# Patient Record
Sex: Male | Born: 1955 | Race: Black or African American | Hispanic: No | Marital: Married | Smoking: Never smoker
Health system: Southern US, Community
[De-identification: ages and names within clinical notes are randomized; demographics above are authoritative.]

## PROBLEM LIST (undated history)

## (undated) DIAGNOSIS — R569 Unspecified convulsions: Secondary | ICD-10-CM

## (undated) DIAGNOSIS — G8190 Hemiplegia, unspecified affecting unspecified side: Secondary | ICD-10-CM

## (undated) DIAGNOSIS — F0390 Unspecified dementia without behavioral disturbance: Secondary | ICD-10-CM

## (undated) DIAGNOSIS — I639 Cerebral infarction, unspecified: Secondary | ICD-10-CM

## (undated) DIAGNOSIS — M1990 Unspecified osteoarthritis, unspecified site: Secondary | ICD-10-CM

## (undated) HISTORY — PX: APPENDECTOMY: SHX54

## (undated) HISTORY — DX: Unspecified dementia, unspecified severity, without behavioral disturbance, psychotic disturbance, mood disturbance, and anxiety: F03.90

## (undated) HISTORY — DX: Unspecified osteoarthritis, unspecified site: M19.90

## (undated) HISTORY — DX: Hemiplegia, unspecified affecting unspecified side: G81.90

## (undated) HISTORY — DX: Unspecified convulsions: R56.9

---

## 2008-11-14 ENCOUNTER — Emergency Department (HOSPITAL_COMMUNITY): Admission: EM | Admit: 2008-11-14 | Discharge: 2008-11-14 | Payer: Self-pay | Admitting: Emergency Medicine

## 2011-09-16 LAB — CBC
MCV: 91.3 fL (ref 78.0–100.0)
Platelets: 232 10*3/uL (ref 150–400)
WBC: 8.6 10*3/uL (ref 4.0–10.5)

## 2011-09-16 LAB — POCT CARDIAC MARKERS
CKMB, poc: 1.6 ng/mL (ref 1.0–8.0)
Myoglobin, poc: 105 ng/mL (ref 12–200)
Myoglobin, poc: 132 ng/mL (ref 12–200)

## 2011-09-16 LAB — DIFFERENTIAL
Eosinophils Absolute: 0 10*3/uL (ref 0.0–0.7)
Lymphs Abs: 1.7 10*3/uL (ref 0.7–4.0)
Neutro Abs: 6.3 10*3/uL (ref 1.7–7.7)
Neutrophils Relative %: 73 % (ref 43–77)

## 2011-09-16 LAB — POCT I-STAT, CHEM 8
Chloride: 106 mEq/L (ref 96–112)
Creatinine, Ser: 1.3 mg/dL (ref 0.4–1.5)
HCT: 51 % (ref 39.0–52.0)
Hemoglobin: 17.3 g/dL — ABNORMAL HIGH (ref 13.0–17.0)
Potassium: 3.3 mEq/L — ABNORMAL LOW (ref 3.5–5.1)
Sodium: 143 mEq/L (ref 135–145)

## 2012-04-03 ENCOUNTER — Ambulatory Visit (INDEPENDENT_AMBULATORY_CARE_PROVIDER_SITE_OTHER): Payer: No Typology Code available for payment source

## 2012-04-04 ENCOUNTER — Ambulatory Visit (INDEPENDENT_AMBULATORY_CARE_PROVIDER_SITE_OTHER): Payer: No Typology Code available for payment source | Admitting: Family Medicine

## 2012-04-04 VITALS — BP 103/68 | HR 66 | Temp 97.7°F | Resp 16 | Ht 67.5 in | Wt 168.0 lb

## 2012-04-04 DIAGNOSIS — Z789 Other specified health status: Secondary | ICD-10-CM

## 2012-04-04 MED ORDER — MEFLOQUINE HCL 250 MG PO TABS: 250.0000 mg | ORAL_TABLET | ORAL | Status: DC

## 2012-04-04 NOTE — Progress Notes (Signed)
  Patient Name: Donald [name] Date of Birth: 12-29-1955 Medical Record Number: [id number] Gender: male Date of Encounter: 04/04/2012  History of Present Illness:  Donald [name] is a 56 y.o. very pleasant male patient who presents with the following:  Will travel to [place] this week- visiting [place] only.  He will be threre for 4 weeks  There is no problem list on file for this patient.  No past medical history on file. No past surgical history on file. History  Substance Use Topics  . Smoking status: Never Smoker   . Smokeless tobacco: Not on file  . Alcohol Use: Not on file   No family history on file. Allergies  Allergen Reactions  . Sulfa Antibiotics Other (See Comments)    Nausea and eye swelling     Medication list has been reviewed and updated.  Review of Systems: As per HPI- otherwise negative.   Physical Examination: Filed Vitals:   04/04/12 1101  BP: 103/68  Pulse: 66  Temp: 97.7 F (36.5 C)  TempSrc: Oral  Resp: 16  Height: 5' 7.5" (1.715 m)  Weight: 168 lb (76.204 kg)    Body mass index is 25.92 kg/(m^2).  GEN: WDWN, NAD, Non-toxic, A & O x 3 HEENT: Atraumatic, Normocephalic. Neck supple. No masses, No LAD. Ears and Nose: No external deformity. CV: RRR, No M/G/R. No JVD. No thrill. No extra heart sounds. PULM: CTA B, no wheezes, crackles, rhonchi. No retractions. No resp. distress. No accessory muscle use EXTR: No c/c/e NEURO Normal gait.  PSYCH: Normally interactive. Conversant. Not depressed or anxious appearing.  Calm demeanor.    Assessment and Plan: 1. Foreign travel    Refilled mefloquine, one tablet weekly starting now and continuing for 4 weeks upon returning.  His tetanus is UTD.  We wish him a safe trip!

## 2012-05-09 ENCOUNTER — Ambulatory Visit: Payer: No Typology Code available for payment source

## 2012-05-09 ENCOUNTER — Ambulatory Visit (INDEPENDENT_AMBULATORY_CARE_PROVIDER_SITE_OTHER): Payer: No Typology Code available for payment source | Admitting: Family Medicine

## 2012-05-09 VITALS — BP 127/74 | HR 53 | Temp 98.6°F | Resp 18 | Ht 67.5 in | Wt 167.4 lb

## 2012-05-09 DIAGNOSIS — M549 Dorsalgia, unspecified: Secondary | ICD-10-CM

## 2012-05-09 DIAGNOSIS — M5430 Sciatica, unspecified side: Secondary | ICD-10-CM

## 2012-05-09 MED ORDER — CYCLOBENZAPRINE HCL 10 MG PO TABS: 10.0000 mg | ORAL_TABLET | Freq: Three times a day (TID) | ORAL | Status: AC | PRN

## 2012-05-09 MED ORDER — PREDNISONE 20 MG PO TABS: ORAL_TABLET | ORAL | Status: AC

## 2012-05-09 NOTE — Progress Notes (Signed)
  Subjective:    Patient ID: Donald [name], male    DOB: July 05, 1956, 56 y.o.   MRN: [id number]  HPI Patient presents with 3 day history of worsening right buttock/hip pain. States it started while he was abroad in [place]. He returned from his trip yesterday but states his symptoms started [place]. He says the pain radiates down the lateral part of his right leg all the way to his foot. Denies weakness, paresthesias, bowel/bladder incontinence or saddle anesthesia. He has good range of motion and says that being up and moving helps his pain. It is worse when trying to stand after being seated for long periods. Denies SOB, chest pain, or difficulty breathing. He has not tried any medications, ice, heat, or stretching.     Review of Systems  All other systems reviewed and are negative.       Objective:   Physical Exam  Constitutional: He is oriented to person, place, and time. He appears well-developed and well-nourished.  HENT:  Head: Normocephalic and atraumatic.  Right Ear: External ear normal.  Left Ear: External ear normal.  Eyes: Conjunctivae are normal.  Cardiovascular: Normal rate, regular rhythm, normal heart sounds and intact distal pulses.   Pulmonary/Chest: Effort normal and breath sounds normal.  Musculoskeletal:       Lumbar back: He exhibits normal range of motion, no tenderness and no spasm. Pain: patient has pain with lumbar flexion and right lateral rotation.  Neurological: He is alert and oriented to person, place, and time. He has normal strength and normal reflexes. Gait normal.  Skin: Skin is warm and dry.  Psychiatric: He has a normal mood and affect. His behavior is normal. Judgment and thought content normal.     [facility] reading (PRIMARY) by  Dr. Neva Seat. No acute bony abnormalities. 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        Assessment & Plan:   1. Sciatica  Take prednisone as directed and flexeril as needed. If no  improvement in symptoms, or any worsening, return for further evaluation.    2. Backache  DG Lumbar Spine Complete, predniSONE (DELTASONE) 20 MG tablet, cyclobenzaprine (FLEXERIL) 10 MG tablet

## 2012-05-09 NOTE — Patient Instructions (Signed)

## 2012-05-09 NOTE — Progress Notes (Signed)
  Subjective:    Patient ID: Donald [name], male    DOB: 03-26-1956, 56 y.o.   MRN: [id number]  HPI    Review of Systems     Objective:   Physical Exam        Assessment & Plan:  Xray read and patient discussed with Rhoderick Moody, PAC. Suspect degenerative l1 on 2 retrolisthesis without apparent pars defects.   Agree with plan.

## 2012-06-09 ENCOUNTER — Other Ambulatory Visit: Payer: Self-pay | Admitting: Physician Assistant

## 2013-01-12 ENCOUNTER — Ambulatory Visit (INDEPENDENT_AMBULATORY_CARE_PROVIDER_SITE_OTHER): Payer: No Typology Code available for payment source | Admitting: Emergency Medicine

## 2013-01-12 VITALS — BP 110/72 | HR 65 | Temp 99.0°F | Resp 17 | Ht 67.5 in | Wt 172.0 lb

## 2013-01-12 DIAGNOSIS — Z7189 Other specified counseling: Secondary | ICD-10-CM

## 2013-01-12 DIAGNOSIS — B54 Unspecified malaria: Secondary | ICD-10-CM

## 2013-01-12 DIAGNOSIS — Z7184 Encounter for health counseling related to travel: Secondary | ICD-10-CM

## 2013-01-12 MED ORDER — DOXYCYCLINE HYCLATE 100 MG PO TABS: 100.0000 mg | ORAL_TABLET | Freq: Every day | ORAL | Status: DC

## 2013-01-12 NOTE — Progress Notes (Signed)
  Subjective:    Patient ID: Donald [name], male    DOB: 05/23/1956, 57 y.o.   MRN: [id number]  HPI patient here requesting medications for malaria prophylaxis. 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 He's taken Lariam in the past    Review of Systems     Objective:   Physical Exam  Patient is alert cooperative in no distress the      Assessment & Plan:  Plan on doxycycline to start 2 days before he leaves during his trip and for 4 weeks after as malaria prophylaxis .

## 2014-01-13 ENCOUNTER — Encounter: Payer: Self-pay | Admitting: Family Medicine

## 2014-01-13 ENCOUNTER — Ambulatory Visit (INDEPENDENT_AMBULATORY_CARE_PROVIDER_SITE_OTHER): Payer: No Typology Code available for payment source | Admitting: Family Medicine

## 2014-01-13 ENCOUNTER — Ambulatory Visit: Payer: No Typology Code available for payment source

## 2014-01-13 VITALS — BP 122/84 | HR 68 | Temp 98.0°F | Resp 16 | Ht 67.5 in | Wt 173.9 lb

## 2014-01-13 DIAGNOSIS — M25519 Pain in unspecified shoulder: Secondary | ICD-10-CM

## 2014-01-13 DIAGNOSIS — M7550 Bursitis of unspecified shoulder: Secondary | ICD-10-CM

## 2014-01-13 MED ORDER — PREDNISONE 20 MG PO TABS: ORAL_TABLET | ORAL | Status: DC

## 2014-01-13 NOTE — Patient Instructions (Signed)
Return Thursday if the medicine is not effective.

## 2014-01-13 NOTE — Progress Notes (Signed)
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 Several weeks of increasing right shoulder pain without history of trauma. He tried doing some exercise which seemed to make it worse. He's also tried taking fish oil thinking that that would help the situation but that his not been the case.  Patient notes the pain is worse when he abducts the arm or reaches behind his back. He's not had this problem before.  Objective: Healthy appearing gentleman in no acute distress Palpation around the chest reveals mild tenderness just below the acromion on the right. He does have pain with abduction or extreme internal rotation.  There is no bony abnormality on inspection and there is no swelling of the soft tissues around the shoulder  [facility] reading (PRIMARY) by  Dr. Milus GlazierLauenstein:  Right shoulder shows no bony abnormality  Assessment: right shoulder bursitis  Plan:  . Pain in joint, shoulder region - Plan: DG Shoulder Right, predniSONE (DELTASONE) 20 MG tablet  Shoulder bursitis - Plan: predniSONE (DELTASONE) 20 MG tablet If not improving, recheck this coming Thursday for injection.   Signed, Elvina SidleKurt Sanaa Zilberman, MD   Signed, Elvina SidleKurt Aristeo Hankerson, MD

## 2014-02-20 ENCOUNTER — Encounter: Payer: Self-pay | Admitting: Physician Assistant

## 2014-02-20 ENCOUNTER — Ambulatory Visit (INDEPENDENT_AMBULATORY_CARE_PROVIDER_SITE_OTHER): Payer: No Typology Code available for payment source | Admitting: Physician Assistant

## 2014-02-20 VITALS — BP 131/85 | HR 62 | Temp 98.2°F | Resp 16 | Ht 67.5 in | Wt 174.0 lb

## 2014-02-20 DIAGNOSIS — Z7189 Other specified counseling: Secondary | ICD-10-CM

## 2014-02-20 DIAGNOSIS — IMO0002 Reserved for concepts with insufficient information to code with codable children: Secondary | ICD-10-CM

## 2014-02-20 MED ORDER — ATOVAQUONE-PROGUANIL HCL 250-100 MG PO TABS: 1.0000 | ORAL_TABLET | Freq: Every day | ORAL | Status: DC

## 2014-02-20 NOTE — Progress Notes (Signed)
   Subjective:    Patient ID: Donald [name], male    DOB: 07/31/1956, 58 y.o.   MRN: [id number]  HPI 58 year old male presents for malaria prophylaxis. He has taken mefloquine and doxycycline in the past. States he did not tolerate doxycyline well and would prefer mefloquine if possible.  However, his flight leaves today at 2:00 p.m and mefloquine is recommended to start 1-2 weeks prior to travel.  He is concerned about side affects of other medications. Has never taken malarone before.  Tetanus and all other immunizations UTD.  He will be traveling to [place] for 2 weeks.  Patient is otherwise doing well with no other concerns today.     Review of Systems  Constitutional: Negative for fever and chills.  Gastrointestinal: Negative for nausea and vomiting.       Objective:   Physical Exam  Constitutional: He is oriented to person, place, and time. He appears well-developed and well-nourished.  HENT:  Head: Normocephalic and atraumatic.  Right Ear: External ear normal.  Left Ear: External ear normal.  Eyes: Conjunctivae are normal.  Neck: Normal range of motion.  Cardiovascular: Normal rate.   Pulmonary/Chest: Effort normal.  Neurological: He is alert and oriented to person, place, and time.  Psychiatric: He has a normal mood and affect. His behavior is normal. Judgment and thought content normal.          Assessment & Plan:  Advice or immunization for travel - Plan: atovaquone-proguanil (MALARONE) 250-100 MG TABS  Will treat with Malarone. Start today, take daily while abroad then for 7 days upon return Follow up here as needed. Safe travels!

## 2014-05-07 ENCOUNTER — Ambulatory Visit (INDEPENDENT_AMBULATORY_CARE_PROVIDER_SITE_OTHER): Payer: No Typology Code available for payment source | Admitting: Family Medicine

## 2014-05-07 VITALS — BP 118/74 | HR 62 | Temp 97.9°F | Resp 16 | Ht 67.0 in | Wt 171.2 lb

## 2014-05-07 DIAGNOSIS — H00019 Hordeolum externum unspecified eye, unspecified eyelid: Secondary | ICD-10-CM

## 2014-05-07 DIAGNOSIS — H44009 Unspecified purulent endophthalmitis, unspecified eye: Secondary | ICD-10-CM

## 2014-05-07 MED ORDER — OFLOXACIN 0.3 % OP SOLN: 2.0000 [drp] | Freq: Four times a day (QID) | OPHTHALMIC | Status: DC

## 2014-05-07 NOTE — Patient Instructions (Signed)
Sty A sty (hordeolum) is an infection of a gland in the eyelid located at the base of the eyelash. A sty may develop a white or yellow head of pus. It can be puffy (swollen). Usually, the sty will burst and pus will come out on its own. They do not leave lumps in the eyelid once they drain. A sty is often confused with another form of cyst of the eyelid called a chalazion. Chalazions occur within the eyelid and not on the edge where the bases of the eyelashes are. They often are red, sore and then form firm lumps in the eyelid. CAUSES   Germs (bacteria).  Lasting (chronic) eyelid inflammation. SYMPTOMS   Tenderness, redness and swelling along the edge of the eyelid at the base of the eyelashes.  Sometimes, there is a white or yellow head of pus. It may or may not drain. DIAGNOSIS  An ophthalmologist will be able to distinguish between a sty and a chalazion and treat the condition appropriately.  TREATMENT   Styes are typically treated with warm packs (compresses) until drainage occurs.  In rare cases, medicines that kill germs (antibiotics) may be prescribed. These antibiotics may be in the form of drops, cream or pills.  If a hard lump has formed, it is generally necessary to do a small incision and remove the hardened contents of the cyst in a minor surgical procedure done in the office.  In suspicious cases, your caregiver may send the contents of the cyst to the lab to be certain that it is not a rare, but dangerous form of cancer of the glands of the eyelid. HOME CARE INSTRUCTIONS   Wash your hands often and dry them with a clean towel. Avoid touching your eyelid. This may spread the infection to other parts of the eye.  Apply heat to your eyelid for 10 to 20 minutes, several times a day, to ease pain and help to heal it faster.  Do not squeeze the sty. Allow it to drain on its own. Wash your eyelid carefully 3 to 4 times per day to remove any pus. SEEK IMMEDIATE MEDICAL CARE IF:    Your eye becomes painful or puffy (swollen).  Your vision changes.  Your sty does not drain by itself within 3 days.  Your sty comes back within a short period of time, even with treatment.  You have redness (inflammation) around the eye.  You have a fever. Document Released: 09/07/2005 Document Revised: 02/20/2012 Document Reviewed: 05/12/2009 Exit[employer] Patient Information 2014 [employer].  

## 2014-05-07 NOTE — Progress Notes (Signed)
Chief Complaint:  Chief Complaint  Patient presents with  . Eye Problem    rt eye pain x 3 days    HPI: Donald [name] is a 58 y.o. male who is here for  Right eye pain x 3 days.  No vision changes, he has had some clear draiange and a bump on the bottom of his right eyelid. No URI sxs  No prior h.o styes. Has tried warm compresses and salt water. NKI  No past medical history on file. Past Surgical History  Procedure Laterality Date  . Appendectomy     History   Social History  . Marital Status: Married    Spouse Name: N/A    Number of Children: N/A  . Years of Education: N/A   Social History Main Topics  . Smoking status: Never Smoker   . Smokeless tobacco: None  . Alcohol Use: No  . Drug Use: No  . Sexual Activity: Yes    Birth Control/ Protection: None   Other Topics Concern  . None   Social History Narrative  . None   Family History  Problem Relation Age of Onset  . Hypertension Mother    Allergies  Allergen Reactions  . Sulfa Antibiotics Other (See Comments)    Nausea and eye swelling    Prior to Admission medications   Medication Sig Start Date End Date Taking? Authorizing Provider  Fish Oil OIL by Does not apply route.    Historical Provider, MD  Multiple Vitamins-Minerals (MENS 50+ MULTI VITAMIN/MIN PO) Take by mouth.    Historical Provider, MD     ROS: The patient denies fevers, chills, night sweats, unintentional weight loss, chest pain, palpitations, wheezing, dyspnea on exertion, nausea, vomiting, abdominal pain, dysuria, hematuria, melena, numbness, weakness, or tingling.   All other systems have been reviewed and were otherwise negative with the exception of those mentioned in the HPI and as above.    PHYSICAL EXAM: Filed Vitals:   05/07/14 0911  BP: 118/74  Pulse: 62  Temp: 97.9 F (36.6 C)  Resp: 16   Filed Vitals:   05/07/14 0911  Height: 5\' 7"  (1.702 m)  Weight: 171 lb 3.2 oz (77.656 kg)   Body mass index is 26.81  kg/(m^2).  General: Alert, no acute distress HEENT:  Normocephalic, atraumatic, oropharynx patent. EOMI, PERRLA, fundo exam normal, + stye lower eye lid with erythema and minimal pustular  Clear dc.  Cardiovascular:  Regular rate and rhythm, no rubs murmurs or gallops.  No Carotid bruits, radial pulse intact. No pedal edema.  Respiratory: Clear to auscultation bilaterally.  No wheezes, rales, or rhonchi.  No cyanosis, no use of accessory musculature GI: No organomegaly, abdomen is soft and non-tender, positive bowel sounds.  No masses. Skin: No rashes. Neurologic: Facial musculature symmetric. Psychiatric: Patient is appropriate throughout our interaction. Lymphatic: No cervical lymphadenopathy Musculoskeletal: Gait intact.   LABS: Results for orders placed during the hospital encounter of 11/14/08  CBC      Result Value Ref Range   WBC 8.6  4.0 - 10.5 K/uL   RBC 5.30  4.22 - 5.81 MIL/uL   Hemoglobin 15.9  13.0 - 17.0 g/dL   HCT 16.148.4  09.639.0 - 04.552.0 %   MCV 91.3  78.0 - 100.0 fL   MCHC 32.9  30.0 - 36.0 g/dL   RDW 40.912.9  81.111.5 - 91.415.5 %   Platelets 232  150 - 400 K/uL  DIFFERENTIAL      Result  Value Ref Range   Neutrophils Relative % 73  43 - 77 %   Neutro Abs 6.3  1.7 - 7.7 K/uL   Lymphocytes Relative 20  12 - 46 %   Lymphs Abs 1.7  0.7 - 4.0 K/uL   Monocytes Relative 6  3 - 12 %   Monocytes Absolute 0.5  0.1 - 1.0 K/uL   Eosinophils Relative 1  0 - 5 %   Eosinophils Absolute 0.0  0.0 - 0.7 K/uL   Basophils Relative 1  0 - 1 %   Basophils Absolute 0.1  0.0 - 0.1 K/uL  POCT I-STAT, CHEM 8      Result Value Ref Range   Sodium 143  135 - 145 mEq/L   Potassium 3.3 (*) 3.5 - 5.1 mEq/L   Chloride 106  96 - 112 mEq/L   BUN 11  6 - 23 mg/dL   Creatinine, Ser 1.3  0.4 - 1.5 mg/dL   Glucose, Bld 277 (*) 70 - 99 mg/dL   Calcium, Ion 8.24  2.35 - 1.32 mmol/L   TCO2 27  0 - 100 mmol/L   Hemoglobin 17.3 (*) 13.0 - 17.0 g/dL   HCT 36.1  44.3 - 15.4 %  POCT CARDIAC MARKERS      Result  Value Ref Range   Myoglobin, poc 105  12 - 200 ng/mL   CKMB, poc 1.6  1.0 - 8.0 ng/mL   Troponin i, poc <0.05  0.00 - 0.09 ng/mL   Comment       Value:            TROPONIN VALUES IN THE RANGE     OF 0.00-0.09 ng/mL SHOW     NO INDICATION OF     MYOCARDIAL INJURY.  POCT CARDIAC MARKERS      Result Value Ref Range   Myoglobin, poc 132  12 - 200 ng/mL   CKMB, poc 1.4  1.0 - 8.0 ng/mL   Troponin i, poc <0.05  0.00 - 0.09 ng/mL   Comment       Value:            TROPONIN VALUES IN THE RANGE     OF 0.00-0.09 ng/mL SHOW     NO INDICATION OF     MYOCARDIAL INJURY.     EKG/XRAY:   Primary read interpreted by Dr. Conley Rolls at [facility].   ASSESSMENT/PLAN: Encounter Diagnoses  Name Primary?  . Stye Yes  . Eye infection    Warm compresses Rx ocuflox  F/u prn  Gross sideeffects, risk and benefits, and alternatives of medications d/w patient. Patient is aware that all medications have potential sideeffects and we are unable to predict every sideeffect or drug-drug interaction that may occur.  Thao P Le, DO 05/07/2014 10:08 AM

## 2015-04-28 ENCOUNTER — Ambulatory Visit (INDEPENDENT_AMBULATORY_CARE_PROVIDER_SITE_OTHER): Payer: No Typology Code available for payment source

## 2015-04-28 ENCOUNTER — Ambulatory Visit (INDEPENDENT_AMBULATORY_CARE_PROVIDER_SITE_OTHER): Payer: No Typology Code available for payment source | Admitting: Internal Medicine

## 2015-04-28 VITALS — BP 128/80 | HR 71 | Temp 98.4°F | Resp 16 | Ht 68.0 in | Wt 172.2 lb

## 2015-04-28 DIAGNOSIS — Z7189 Other specified counseling: Secondary | ICD-10-CM | POA: Diagnosis not present

## 2015-04-28 DIAGNOSIS — L299 Pruritus, unspecified: Secondary | ICD-10-CM

## 2015-04-28 DIAGNOSIS — G933 Postviral fatigue syndrome: Secondary | ICD-10-CM

## 2015-04-28 DIAGNOSIS — Z23 Encounter for immunization: Secondary | ICD-10-CM

## 2015-04-28 DIAGNOSIS — Z0000 Encounter for general adult medical examination without abnormal findings: Secondary | ICD-10-CM

## 2015-04-28 DIAGNOSIS — Z719 Counseling, unspecified: Secondary | ICD-10-CM

## 2015-04-28 DIAGNOSIS — G9331 Postviral fatigue syndrome: Secondary | ICD-10-CM

## 2015-04-28 LAB — POCT CBC
Granulocyte percent: 50.6 %G (ref 37–80)
HEMATOCRIT: 46.1 % (ref 43.5–53.7)
HEMOGLOBIN: 14.1 g/dL (ref 14.1–18.1)
LYMPH, POC: 2 (ref 0.6–3.4)
MCH, POC: 27.8 pg (ref 27–31.2)
MCHC: 30.6 g/dL — AB (ref 31.8–35.4)
MCV: 90.8 fL (ref 80–97)
MID (CBC): 0.3 (ref 0–0.9)
MPV: 9.3 fL (ref 0–99.8)
POC GRANULOCYTE: 2.4 (ref 2–6.9)
POC LYMPH %: 42.4 % (ref 10–50)
POC MID %: 7 % (ref 0–12)
Platelet Count, POC: 226 10*3/uL (ref 142–424)
RBC: 5.07 M/uL (ref 4.69–6.13)
RDW, POC: 16 %
WBC: 4.7 10*3/uL (ref 4.6–10.2)

## 2015-04-28 LAB — POCT URINALYSIS DIPSTICK
BILIRUBIN UA: NEGATIVE
Blood, UA: NEGATIVE
GLUCOSE UA: NEGATIVE
Ketones, UA: NEGATIVE
Leukocytes, UA: NEGATIVE
Nitrite, UA: NEGATIVE
Protein, UA: NEGATIVE
Spec Grav, UA: 1.015
Urobilinogen, UA: 0.2
pH, UA: 6

## 2015-04-28 LAB — POCT UA - MICROSCOPIC ONLY
Bacteria, U Microscopic: NEGATIVE
CRYSTALS, UR, HPF, POC: NEGATIVE
Casts, Ur, LPF, POC: NEGATIVE
EPITHELIAL CELLS, URINE PER MICROSCOPY: NEGATIVE
Mucus, UA: NEGATIVE
RBC, urine, microscopic: NEGATIVE
WBC, UR, HPF, POC: NEGATIVE
YEAST UA: NEGATIVE

## 2015-04-28 LAB — GLUCOSE, POCT (MANUAL RESULT ENTRY): POC GLUCOSE: 89 mg/dL (ref 70–99)

## 2015-04-28 MED ORDER — NYSTATIN 100000 UNIT/GM EX CREA: 1.0000 "application " | TOPICAL_CREAM | Freq: Two times a day (BID) | CUTANEOUS | Status: DC

## 2015-04-28 NOTE — Patient Instructions (Addendum)
Hepatitis A Vaccine, Inactivated suspension for injection What is this medicine? HEPATITIS A VACCINE (hep uh TAHY tis A VAK seen) is a vaccine to protect from an infection with the hepatitis A virus. This vaccine does not contain the live virus. It will not cause a hepatitis infection. This vaccine is also used with immunoglobulin to prevent infection in people who have been exposed to hepatitis A. This medicine may be used for other purposes; ask your health care provider or pharmacist if you have questions. COMMON BRAND NAME(S): Havrix, Vaqta What should I tell my health care provider before I take this medicine? They need to know if you have any of these conditions: -bleeding disorder -fever or infection -heart disease -immune system problems -an unusual or allergic reaction to hepatitis A vaccine, latex, neomycin, other medicines, foods, dyes, or preservatives -pregnant or trying to get pregnant -breast-feeding How should I use this medicine? This vaccine is for injection into a muscle. It is given by a health care professional. A copy of Vaccine Information Statements will be given before each vaccination. Read this sheet carefully each time. The sheet may change frequently. Talk to your pediatrician regarding the use of this medicine in children. While this drug may be prescribed for children as young as 52 months of age for selected conditions, precautions do apply. Overdosage: If you think you have taken too much of this medicine contact a poison control center or emergency room at once. NOTE: This medicine is only for you. Do not share this medicine with others. What if I miss a dose? This does not apply. What may interact with this medicine? -medicines to treat cancer -medicines that suppress your immune function like adalimumab, anakinra, etanercept, infliximab -steroid medicines like prednisone or cortisone This list may not describe all possible interactions. Give your health  care provider a list of all the medicines, herbs, non-prescription drugs, or dietary supplements you use. Also tell them if you smoke, drink alcohol, or use illegal drugs. Some items may interact with your medicine. What should I watch for while using this medicine? See your health care provider for a booster shot of this vaccine as directed. Tell your doctor right away if you have any serious or unusual side effects after getting this vaccine. You will not have protection from the hepatitis A virus for at least 8 to 10 days after your first injection. The length of time you will have protection from hepatitis A virus infection is not known. Check with your doctor if you have questions about your immunity. See your doctor before you travel out of the country. What side effects may I notice from receiving this medicine? Side effects that you should report to your doctor or health care professional as soon as possible: -allergic reactions like skin rash, itching or hives, swelling of the face, lips, or tongue -breathing problems -seizures -yellowing of the eyes or skin Side effects that usually do not require medical attention (report to your doctor or health care professional if they continue or are bothersome): -diarrhea -fever -loss of appetite -muscle pain -nausea -pain, redness, swelling or irritation at site where injected -tiredness This list may not describe all possible side effects. Call your doctor for medical advice about side effects. You may report side effects to FDA at 1-800-FDA-1088. Where should I keep my medicine? This drug is given in a hospital or clinic and will not be stored at home. NOTE: This sheet is a summary. It may not cover all possible  information. If you have questions about this medicine, talk to your doctor, pharmacist, or health care provider.  2015, Elsevier/Gold Standard. (2014-03-31 13:19:40) Jock Itch Jock itch is a fungal infection of the skin in the  groin area. It is sometimes called "ringworm" even though it is not caused by a worm. A fungus is a type of germ that thrives in dark, damp places.  CAUSES  This infection may spread from:  A fungus infection elsewhere on the body (such as athlete's foot).  Sharing towels or clothing. This infection is more common in:  Hot, humid climates.  People who wear tight-fitting clothing or wet bathing suits for long periods of time.  Athletes.  Overweight people.  People with diabetes. SYMPTOMS  Jock itch causes the following symptoms:  Red, pink or brown rash in the groin. Rash may spread to the thighs, anus, and buttocks.  Itching. DIAGNOSIS  Your caregiver may make the diagnosis by looking at the rash. Sometimes a skin scraping will be sent to test for fungus. Testing can be done either by looking under the microscope or by doing a culture (test to try to grow the fungus). A culture can take up to 2 weeks to come back. TREATMENT  Jock itch may be treated with:  Skin cream or ointment to kill fungus.  Medicine by mouth to kill fungus.  Skin cream or ointment to calm the itching.  Compresses or medicated powders to dry the infected skin. HOME CARE INSTRUCTIONS   Be sure to treat the rash completely. Follow your caregiver's instructions. It can take a couple of weeks to treat. If you do not treat the infection long enough, the rash can come back.  Wear loose-fitting clothing.  Men should wear cotton boxer shorts.  Women should wear cotton underwear.  Avoid hot baths.  Dry the groin area well after bathing. SEEK MEDICAL CARE IF:   Your rash is worse.  Your rash is spreading.  Your rash returns after treatment is finished.  Your rash is not gone in 4 weeks. Fungal infections are slow to respond to treatment. Some redness may remain for several weeks after the fungus is gone. SEEK IMMEDIATE MEDICAL CARE IF:  The area becomes red, warm, tender, and swollen.  You have  a fever. Document Released: 11/18/2002 Document Revised: 02/20/2012 Document Reviewed: 10/17/2008 [facility] Patient Information 2015 [employer] [place]. This information is not intended to replace advice given to you by your health care provider. Make sure you discuss any questions you have with your health care provider.

## 2015-04-28 NOTE — Progress Notes (Signed)
Subjective:    Patient ID: Donald [name], male    DOB: 10/12/1956, 59 y.o.   MRN: [id number]  HPI Here for CPE. Had an itching episode last night on genitals only. No sxs today and feels good. Had colonoscopy 8 yrs ago [place]. No fhx of cancer.   Review of Systems  Constitutional: Negative.   HENT: Negative.   Eyes: Positive for pain and visual disturbance.  Respiratory: Negative.   Cardiovascular: Negative.   Gastrointestinal: Negative.   Endocrine: Positive for polyuria.  Genitourinary: Positive for frequency.  Musculoskeletal: Negative.   Allergic/Immunologic: Negative.   Neurological: Negative.   Hematological: Negative.   Psychiatric/Behavioral: Negative.        Objective:   Physical Exam  Constitutional: He is oriented to person, place, and time. He appears well-developed and well-nourished. No distress.  HENT:  Head: Normocephalic.  Right Ear: External ear normal.  Left Ear: External ear normal.  Nose: Nose normal.  Mouth/Throat: Oropharynx is clear and moist.  Eyes: Conjunctivae and EOM are normal. Pupils are equal, round, and reactive to light. No scleral icterus.  Neck: Normal range of motion. Neck supple. No tracheal deviation present. No thyromegaly present.  Cardiovascular: Normal rate, regular rhythm, S1 normal, S2 normal, normal heart sounds, intact distal pulses and normal pulses.  Exam reveals no gallop.   No murmur heard. Pulmonary/Chest: Effort normal and breath sounds normal. No respiratory distress. He has no wheezes. He has no rales. He exhibits no tenderness.  Abdominal: Soft. Bowel sounds are normal. There is no tenderness. There is no rebound and no guarding. Hernia confirmed negative in the right inguinal area and confirmed negative in the left inguinal area.  Genitourinary: Rectum normal, testes normal and penis normal. Rectal exam shows no external hemorrhoid, no internal hemorrhoid, no fissure, no mass, no tenderness and anal tone normal. Prostate  is tender. Prostate is not enlarged. Circumcised. No penile erythema or penile tenderness. No discharge found.  Musculoskeletal: Normal range of motion.  Lymphadenopathy:    He has no cervical adenopathy.       Right: No inguinal adenopathy present.       Left: No inguinal adenopathy present.  Neurological: He is alert and oriented to person, place, and time. He has normal reflexes. No cranial nerve deficit. He exhibits normal muscle tone. Coordination normal.  Skin: No rash noted. No erythema.  Psychiatric: He has a normal mood and affect. His behavior is normal. Judgment and thought content normal.   [facility] reading (PRIMARY) by  Dr.Guest normal cxr Results for orders placed or performed in visit on 04/28/15  POCT CBC  Result Value Ref Range   WBC 4.7 4.6 - 10.2 K/uL   Lymph, poc 2.0 0.6 - 3.4   POC LYMPH PERCENT 42.4 10 - 50 %L   MID (cbc) 0.3 0 - 0.9   POC MID % 7.0 0 - 12 %M   POC Granulocyte 2.4 2 - 6.9   Granulocyte percent 50.6 37 - 80 %G   RBC 5.07 4.69 - 6.13 M/uL   Hemoglobin 14.1 14.1 - 18.1 g/dL   HCT, POC 40.946.1 81.143.5 - 53.7 %   MCV 90.8 80 - 97 fL   MCH, POC 27.8 27 - 31.2 pg   MCHC 30.6 (A) 31.8 - 35.4 g/dL   RDW, POC 91.416.0 %   Platelet Count, POC 226 142 - 424 K/uL   MPV 9.3 0 - 99.8 fL  POCT glucose (manual entry)  Result Value Ref Range  POC Glucose 89 70 - 99 mg/dl   EKG normal        Assessment & Plan:  Hep A 1st immunization/next November Tender Prostate

## 2015-04-29 LAB — COMPREHENSIVE METABOLIC PANEL
ALT: 20 U/L (ref 0–53)
AST: 21 U/L (ref 0–37)
Albumin: 4.6 g/dL (ref 3.5–5.2)
Alkaline Phosphatase: 104 U/L (ref 39–117)
BILIRUBIN TOTAL: 0.5 mg/dL (ref 0.2–1.2)
BUN: 10 mg/dL (ref 6–23)
CALCIUM: 9.7 mg/dL (ref 8.4–10.5)
CHLORIDE: 104 meq/L (ref 96–112)
CO2: 28 meq/L (ref 19–32)
CREATININE: 1.12 mg/dL (ref 0.50–1.35)
Glucose, Bld: 91 mg/dL (ref 70–99)
Potassium: 4.4 mEq/L (ref 3.5–5.3)
Sodium: 140 mEq/L (ref 135–145)
Total Protein: 7.8 g/dL (ref 6.0–8.3)

## 2015-04-29 LAB — LIPID PANEL
CHOL/HDL RATIO: 2.8 ratio
CHOLESTEROL: 196 mg/dL (ref 0–200)
HDL: 70 mg/dL (ref >=40)
LDL Cholesterol: 115 mg/dL — ABNORMAL HIGH (ref 0–99)
Triglycerides: 56 mg/dL (ref <150)
VLDL: 11 mg/dL (ref 0–40)

## 2015-04-29 LAB — TSH: TSH: 0.524 u[IU]/mL (ref 0.350–4.500)

## 2015-04-29 LAB — PSA: PSA: 2.36 ng/mL (ref <=4.00)

## 2015-04-29 LAB — HEPATITIS B SURFACE ANTIBODY, QUANTITATIVE: Hepatitis B-Post: 0.2 m[IU]/mL

## 2015-04-29 LAB — HIV ANTIBODY (ROUTINE TESTING W REFLEX): HIV: NONREACTIVE

## 2015-05-01 LAB — IFOBT (OCCULT BLOOD): IFOBT: NEGATIVE

## 2016-04-15 ENCOUNTER — Ambulatory Visit (INDEPENDENT_AMBULATORY_CARE_PROVIDER_SITE_OTHER): Payer: No Typology Code available for payment source | Admitting: Emergency Medicine

## 2016-04-15 ENCOUNTER — Other Ambulatory Visit: Payer: Self-pay | Admitting: Emergency Medicine

## 2016-04-15 VITALS — BP 122/80 | HR 61 | Temp 98.2°F | Resp 16 | Ht 67.0 in | Wt 169.2 lb

## 2016-04-15 DIAGNOSIS — Z719 Counseling, unspecified: Secondary | ICD-10-CM

## 2016-04-15 DIAGNOSIS — B839 Helminthiasis, unspecified: Secondary | ICD-10-CM

## 2016-04-15 DIAGNOSIS — L299 Pruritus, unspecified: Secondary | ICD-10-CM

## 2016-04-15 DIAGNOSIS — Z0000 Encounter for general adult medical examination without abnormal findings: Secondary | ICD-10-CM | POA: Diagnosis not present

## 2016-04-15 DIAGNOSIS — H00013 Hordeolum externum right eye, unspecified eyelid: Secondary | ICD-10-CM

## 2016-04-15 DIAGNOSIS — Z7189 Other specified counseling: Secondary | ICD-10-CM

## 2016-04-15 DIAGNOSIS — Z114 Encounter for screening for human immunodeficiency virus [HIV]: Secondary | ICD-10-CM

## 2016-04-15 DIAGNOSIS — Z1159 Encounter for screening for other viral diseases: Secondary | ICD-10-CM | POA: Diagnosis not present

## 2016-04-15 DIAGNOSIS — G933 Postviral fatigue syndrome: Secondary | ICD-10-CM

## 2016-04-15 DIAGNOSIS — Z131 Encounter for screening for diabetes mellitus: Secondary | ICD-10-CM

## 2016-04-15 DIAGNOSIS — G9331 Postviral fatigue syndrome: Secondary | ICD-10-CM

## 2016-04-15 DIAGNOSIS — H44001 Unspecified purulent endophthalmitis, right eye: Secondary | ICD-10-CM

## 2016-04-15 LAB — COMPLETE METABOLIC PANEL WITH GFR
ALT: 21 U/L (ref 9–46)
AST: 22 U/L (ref 10–35)
Albumin: 4.8 g/dL (ref 3.6–5.1)
Alkaline Phosphatase: 94 U/L (ref 40–115)
BUN: 10 mg/dL (ref 7–25)
CALCIUM: 9.7 mg/dL (ref 8.6–10.3)
CHLORIDE: 102 mmol/L (ref 98–110)
CO2: 27 mmol/L (ref 20–31)
Creat: 1.12 mg/dL (ref 0.70–1.33)
GFR, EST AFRICAN AMERICAN: 83 mL/min (ref >=60)
GFR, EST NON AFRICAN AMERICAN: 72 mL/min (ref >=60)
Glucose, Bld: 87 mg/dL (ref 65–99)
POTASSIUM: 4.2 mmol/L (ref 3.5–5.3)
Sodium: 140 mmol/L (ref 135–146)
Total Bilirubin: 0.5 mg/dL (ref 0.2–1.2)
Total Protein: 7.6 g/dL (ref 6.1–8.1)

## 2016-04-15 LAB — LIPID PANEL
CHOL/HDL RATIO: 3 ratio (ref <=5.0)
CHOLESTEROL: 210 mg/dL — AB (ref 125–200)
HDL: 70 mg/dL (ref >=40)
LDL Cholesterol: 126 mg/dL (ref <130)
TRIGLYCERIDES: 69 mg/dL (ref <150)
VLDL: 14 mg/dL (ref <30)

## 2016-04-15 LAB — POCT CBC
GRANULOCYTE PERCENT: 43.1 % (ref 37–80)
HEMATOCRIT: 44.9 % (ref 43.5–53.7)
HEMOGLOBIN: 15.5 g/dL (ref 14.1–18.1)
LYMPH, POC: 2.2 (ref 0.6–3.4)
MCH: 30.7 pg (ref 27–31.2)
MCHC: 34.5 g/dL (ref 31.8–35.4)
MCV: 88.9 fL (ref 80–97)
MID (cbc): 0.3 (ref 0–0.9)
MPV: 9.2 fL (ref 0–99.8)
POC GRANULOCYTE: 1.9 — AB (ref 2–6.9)
POC LYMPH PERCENT: 49.4 %L (ref 10–50)
POC MID %: 7.5 % (ref 0–12)
Platelet Count, POC: 192 10*3/uL (ref 142–424)
RBC: 5.06 M/uL (ref 4.69–6.13)
RDW, POC: 14.1 %
WBC: 4.5 10*3/uL — AB (ref 4.6–10.2)

## 2016-04-15 LAB — POCT URINALYSIS DIP (MANUAL ENTRY)
BILIRUBIN UA: NEGATIVE
GLUCOSE UA: NEGATIVE
Ketones, POC UA: NEGATIVE
LEUKOCYTES UA: NEGATIVE
Nitrite, UA: NEGATIVE
Protein Ur, POC: NEGATIVE
Spec Grav, UA: 1.025
Urobilinogen, UA: 0.2
pH, UA: 5

## 2016-04-15 LAB — HEPATITIS C ANTIBODY: HCV Ab: NEGATIVE

## 2016-04-15 LAB — POC MICROSCOPIC URINALYSIS (UMFC)

## 2016-04-15 LAB — GLUCOSE, POCT (MANUAL RESULT ENTRY): POC GLUCOSE: 91 mg/dL (ref 70–99)

## 2016-04-15 LAB — HEMOCCULT GUIAC POC 1CARD (OFFICE): FECAL OCCULT BLD: NEGATIVE

## 2016-04-15 LAB — TSH: TSH: 0.9 m[IU]/L (ref 0.40–4.50)

## 2016-04-15 MED ORDER — OFLOXACIN 0.3 % OP SOLN: 2.0000 [drp] | Freq: Four times a day (QID) | OPHTHALMIC | Status: DC

## 2016-04-15 MED ORDER — MEBENDAZOLE 100 MG PO CHEW: CHEWABLE_TABLET | ORAL | Status: DC

## 2016-04-15 NOTE — Progress Notes (Signed)
By signing my name below, I, Donald [name], attest that this documentation has been prepared under the direction and in the presence of Donald ChrisSteven Legacy Lacivita, MD.  Electronically Signed: Andrew Auaven [name], ED Scribe. 04/15/2016. 9:31 AM.  Chief Complaint:  Chief Complaint  Patient presents with  . Annual Exam  . Labs Only    Pt states he would like stool checked  . dry mouth    x 3 months    HPI: Donald [name] is a 60 y.o. male who [facility]. Last physical was 1 year ago.   Pt noticed tiny worms in his stool 1 day ago. He reports a possibility of worms in stool in past during early childhood.  Pt is from [place] and last went back home to visit 2 years ago. He reports having colonoscopy 9-10 years ago in [place]. He unsure if he had a polyp removed but has record of this at home and plans to review them. He also mentions having an umbilical hernia. He has hx of appendectomy. Pt denies abdominal pain, discomfort and blood in stool.   Pt also complains of unexpected weight loss.   Wt Readings from Last 3 Encounters:  04/15/16 169 lb 3.2 oz (76.749 kg)  04/28/15 172 lb 3.2 oz (78.109 kg)  05/07/14 171 lb 3.2 oz (77.656 kg)   Pt is a Education officer, environmentalpastor.   Past Medical History  Diagnosis Date  . Arthritis    Past Surgical History  Procedure Laterality Date  . Appendectomy     Social History   Social History  . Marital Status: Married    Spouse Name: N/A  . Number of Children: N/A  . Years of Education: N/A   Social History Main Topics  . Smoking status: Never Smoker   . Smokeless tobacco: None  . Alcohol Use: No  . Drug Use: No  . Sexual Activity: Yes    Birth Control/ Protection: None   Other Topics Concern  . None   Social History Narrative   Family History  Problem Relation Age of Onset  . Hypertension Mother    Allergies  Allergen Reactions  . Sulfa Antibiotics Other (See Comments)    Nausea and eye swelling    Prior to Admission medications     Medication Sig Start Date End Date Taking? Authorizing Provider  Fish Oil OIL by Does not apply route.   Yes Historical Provider, MD  Multiple Vitamins-Minerals (MENS 50+ MULTI VITAMIN/MIN PO) Take by mouth.   Yes Historical Provider, MD  nystatin cream (MYCOSTATIN) Apply 1 application topically 2 (two) times daily. Patient not taking: Reported on 04/15/2016 04/28/15   Donald Albeehris W Guest, MD  ofloxacin (OCUFLOX) 0.3 % ophthalmic solution Place 2 drops into the right eye 4 (four) times daily. Patient not taking: Reported on 04/28/2015 05/07/14   Donald P Le, DO     ROS: The patient denies fevers, chills, night sweats, chest pain, palpitations, wheezing, dyspnea on exertion, nausea, vomiting, abdominal pain, dysuria, hematuria, melena, numbness, weakness, or tingling.   All other systems have been reviewed and were otherwise negative with the exception of those mentioned in the HPI and as above.    PHYSICAL EXAM: Filed Vitals:   04/15/16 0837  BP: 122/80  Pulse: 61  Temp: 98.2 F (36.8 C)  Resp: 16   Body mass index is 26.49 kg/(m^2).   General: Alert, no acute distress HEENT:  Normocephalic, atraumatic, oropharynx patent. Eye: EOMI, PEERLDC Bilateral cataracts.  Cardiovascular:  Regular rate and rhythm, no rubs murmurs or gallops.  No Carotid bruits, radial pulse intact. No pedal edema.  Respiratory: Clear to auscultation bilaterally.  No wheezes, rales, or rhonchi.  No cyanosis, no use of accessory musculature Abdominal: No organomegaly, abdomen is soft and non-tender, positive bowel sounds.  No masses. 1 cm umbilical hernia. Healed scar right lower abdomen.  Musculoskeletal: Gait intact. No edema, tenderness. Extremities normal  Skin: No rashes. Neurologic: Facial musculature symmetric. Psychiatric: Patient acts appropriately throughout our interaction. Lymphatic: No cervical or submandibular lymphadenopathy Prostate normal size.   LABS: Results for orders placed or performed in visit  on 04/15/16  Hemoccult - 1 Card (office)  Result Value Ref Range   Fecal Occult Blood, POC Negative Negative   Card #1 Date 04/15/16    Card #2 Fecal Occult Blod, POC     Card #2 Date     Card #3 Fecal Occult Blood, POC     Card #3 Date    POCT CBC  Result Value Ref Range   WBC 4.5 (A) 4.6 - 10.2 K/uL   Lymph, poc 2.2 0.6 - 3.4   POC LYMPH PERCENT 49.4 10 - 50 %L   MID (cbc) 0.3 0 - 0.9   POC MID % 7.5 0 - 12 %M   POC Granulocyte 1.9 (A) 2 - 6.9   Granulocyte percent 43.1 37 - 80 %G   RBC 5.06 4.69 - 6.13 M/uL   Hemoglobin 15.5 14.1 - 18.1 g/dL   HCT, POC 16.1 09.6 - 53.7 %   MCV 88.9 80 - 97 fL   MCH, POC 30.7 27 - 31.2 pg   MCHC 34.5 31.8 - 35.4 g/dL   RDW, POC 04.5 %   Platelet Count, POC 192 142 - 424 K/uL   MPV 9.2 0 - 99.8 fL  POCT glucose (manual entry)  Result Value Ref Range   POC Glucose 91 70 - 99 mg/dl    ASSESSMENT/PLAN: Patient looks very healthy. I would advise him to get another hep A vaccine prior to his upcoming trip to [place] if he has not had his booster. I did collect stools for O&P. Prescription was sent in for mebendazole 100 mg twice a day for 3 days to treat round worm infection. Routine labs were also done.I personally performed the services described in this documentation, which was scribed in my presence. The recorded information has been reviewed and is accurate.   Gross sideeffects, risk and benefits, and alternatives of medications d/w patient. Patient is aware that all medications have potential sideeffects and we are unable to predict every sideeffect or drug-drug interaction that may occur.  Donald Chris MD 04/15/2016 9:31 AM

## 2016-04-15 NOTE — Telephone Encounter (Signed)
Dr Cleta Albertsaub, Pt went to pick up prescribed medication after office visit States, eye drops and Nystatin cream missing from pharmacy; told you would refill I refilled medications and sent e-scribed. I hope that is ok!

## 2016-04-16 LAB — PSA: PSA: 2.08 ng/mL (ref <=4.00)

## 2016-04-16 LAB — HIV ANTIBODY (ROUTINE TESTING W REFLEX): HIV: NONREACTIVE

## 2016-04-16 MED ORDER — NYSTATIN 100000 UNIT/GM EX CREA: 1.0000 "application " | TOPICAL_CREAM | Freq: Two times a day (BID) | CUTANEOUS | Status: DC

## 2016-04-19 NOTE — Addendum Note (Signed)
Addended by: Felix AhmadiFRANSEN, Brantleigh Mifflin A on: 04/19/2016 10:33 AM   Modules accepted: Orders, SmartSet

## 2016-04-20 LAB — OVA AND PARASITE EXAMINATION

## 2016-04-22 ENCOUNTER — Telehealth: Payer: Self-pay | Admitting: Emergency Medicine

## 2016-04-22 ENCOUNTER — Other Ambulatory Visit: Payer: Self-pay | Admitting: Emergency Medicine

## 2016-04-22 DIAGNOSIS — Z1211 Encounter for screening for malignant neoplasm of colon: Secondary | ICD-10-CM

## 2016-04-22 NOTE — Telephone Encounter (Signed)
Call patient. I have made him an appointment to see GI because it is almost 10 years since his colonoscopy. Also tell him I would be happy for him to come in next week to discuss what we found in his stool specimen.

## 2016-04-22 NOTE — Telephone Encounter (Signed)
LMVM for patient to CB.   

## 2016-04-25 NOTE — Telephone Encounter (Signed)
LMTRC jp/cma  

## 2016-04-26 NOTE — Telephone Encounter (Signed)
Pt returned call and was informed of Dr. Ellis Parentsaub's message

## 2016-06-03 ENCOUNTER — Encounter: Payer: Self-pay | Admitting: Emergency Medicine

## 2016-06-19 ENCOUNTER — Telehealth: Payer: Self-pay | Admitting: Emergency Medicine

## 2016-06-19 NOTE — Telephone Encounter (Signed)
Please send letter to patient to encourage him to schedule his GI appointment.

## 2016-06-21 NOTE — Telephone Encounter (Signed)
Letter sent.

## 2017-04-03 ENCOUNTER — Ambulatory Visit (INDEPENDENT_AMBULATORY_CARE_PROVIDER_SITE_OTHER): Payer: No Typology Code available for payment source | Admitting: Family Medicine

## 2017-04-03 VITALS — BP 125/79 | HR 64 | Temp 98.0°F | Resp 17 | Ht 67.0 in | Wt 170.0 lb

## 2017-04-03 DIAGNOSIS — Z0000 Encounter for general adult medical examination without abnormal findings: Secondary | ICD-10-CM | POA: Insufficient documentation

## 2017-04-03 NOTE — Progress Notes (Signed)
  Donald [name] - 61 y.o. male MRN [id number]  Date of birth: 04/07/1956  SUBJECTIVE:  Including CC & ROS.  Chief Complaint  Patient presents with  . Annual Exam    Donald [name] is a 61 yo that is presenting for an annual exam.  He will be traveling to [place] in May and June.  Reports he had a colonoscopy roughly 5 years ago.  Does not take any medications.   ROS: No unexpected weight loss, fever, chills, swelling, instability, muscle pain, numbness/tingling, redness, otherwise see HPI    HISTORY: Past Medical, Surgical, Social, and Family History Reviewed & Updated per EMR.   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  Psych: good insight, alert and oriented  MSK: normal strength in UE and LE b/l, normal gait   ASSESSMENT & PLAN:   Annual physical exam He is doing well.  - labs today  - f/u in one year or sooner if needed.

## 2017-04-03 NOTE — Patient Instructions (Addendum)
Thank you for coming in,   We will call you or send a letter with your lab results.   Please follow up with [place] in one year or sooner if needed.    Please feel free to call with any questions or concerns at any time, at [phone]. --Dr. Jordan Likes     IF you received an x-ray today, you will receive an invoice from [facility]. Please contact [facility] at [phone] with questions or concerns regarding your invoice.   IF you received labwork today, you will receive an invoice from [facility]. Please contact [facility] at [phone] with questions or concerns regarding your invoice.   Our billing staff will not be able to assist you with questions regarding bills from these companies.  You will be contacted with the lab results as soon as they are available. The fastest way to get your results is to activate your My Chart account. Instructions are located on the last page of this paperwork. If you have not heard from [place] regarding the results in 2 weeks, please contact this office.

## 2017-04-03 NOTE — Assessment & Plan Note (Signed)
He is doing well.  - labs today  - f/u in one year or sooner if needed.

## 2017-04-04 LAB — COMPREHENSIVE METABOLIC PANEL
A/G RATIO: 1.6 (ref 1.2–2.2)
ALBUMIN: 4.5 g/dL (ref 3.6–4.8)
ALK PHOS: 113 IU/L (ref 39–117)
ALT: 22 IU/L (ref 0–44)
AST: 22 IU/L (ref 0–40)
BILIRUBIN TOTAL: 0.4 mg/dL (ref 0.0–1.2)
BUN / CREAT RATIO: 9 — AB (ref 10–24)
BUN: 11 mg/dL (ref 8–27)
CHLORIDE: 103 mmol/L (ref 96–106)
CO2: 26 mmol/L (ref 18–29)
Calcium: 9.5 mg/dL (ref 8.6–10.2)
Creatinine, Ser: 1.2 mg/dL (ref 0.76–1.27)
GFR calc Af Amer: 76 mL/min/{1.73_m2} (ref >59)
GFR calc non Af Amer: 65 mL/min/{1.73_m2} (ref >59)
GLOBULIN, TOTAL: 2.8 g/dL (ref 1.5–4.5)
GLUCOSE: 97 mg/dL (ref 65–99)
POTASSIUM: 4.6 mmol/L (ref 3.5–5.2)
SODIUM: 142 mmol/L (ref 134–144)
Total Protein: 7.3 g/dL (ref 6.0–8.5)

## 2017-04-04 LAB — CBC
Hematocrit: 46.6 % (ref 37.5–51.0)
Hemoglobin: 15.4 g/dL (ref 13.0–17.7)
MCH: 30 pg (ref 26.6–33.0)
MCHC: 33 g/dL (ref 31.5–35.7)
MCV: 91 fL (ref 79–97)
PLATELETS: 234 10*3/uL (ref 150–379)
RBC: 5.14 x10E6/uL (ref 4.14–5.80)
RDW: 14 % (ref 12.3–15.4)
WBC: 4.1 10*3/uL (ref 3.4–10.8)

## 2017-04-04 LAB — LIPID PANEL
Chol/HDL Ratio: 3.2 ratio (ref 0.0–5.0)
Cholesterol, Total: 191 mg/dL (ref 100–199)
HDL: 59 mg/dL (ref >39)
LDL CALC: 116 mg/dL — AB (ref 0–99)
Triglycerides: 78 mg/dL (ref 0–149)
VLDL CHOLESTEROL CAL: 16 mg/dL (ref 5–40)

## 2017-04-04 LAB — PSA: PROSTATE SPECIFIC AG, SERUM: 2.2 ng/mL (ref 0.0–4.0)

## 2017-04-21 ENCOUNTER — Telehealth: Payer: Self-pay | Admitting: General Practice

## 2017-04-21 NOTE — Telephone Encounter (Signed)
Pt advised of essentially nl results and mailed to pt

## 2017-04-21 NOTE — Telephone Encounter (Signed)
Pt is looking for lab results   Best number [phone]

## 2018-03-03 ENCOUNTER — Ambulatory Visit: Payer: No Typology Code available for payment source | Admitting: Physician Assistant

## 2018-03-03 ENCOUNTER — Other Ambulatory Visit: Payer: Self-pay

## 2018-03-03 ENCOUNTER — Encounter: Payer: Self-pay | Admitting: Physician Assistant

## 2018-03-03 VITALS — BP 120/74 | HR 57 | Temp 98.6°F | Resp 18 | Ht 67.0 in | Wt 172.4 lb

## 2018-03-03 DIAGNOSIS — Z2989 Encounter for other specified prophylactic measures: Secondary | ICD-10-CM

## 2018-03-03 DIAGNOSIS — Z23 Encounter for immunization: Secondary | ICD-10-CM | POA: Diagnosis not present

## 2018-03-03 DIAGNOSIS — Z298 Encounter for other specified prophylactic measures: Secondary | ICD-10-CM | POA: Diagnosis not present

## 2018-03-03 MED ORDER — ATOVAQUONE-PROGUANIL HCL 250-100 MG PO TABS: 1.0000 | ORAL_TABLET | Freq: Every day | ORAL | 0 refills | Status: DC

## 2018-03-03 MED ORDER — TYPHOID VACCINE PO CPDR: 1.0000 | DELAYED_RELEASE_CAPSULE | ORAL | 0 refills | Status: AC

## 2018-03-03 NOTE — Progress Notes (Signed)
   Donald [name]  MRN: [id number] DOB: 12/20/1955  PCP: Patient, No Pcp Per  Chief Complaint  Patient presents with  . traveling    traveling to [place] preventive meds     Subjective:  Pt presents to clinic for questions regarding travel to [place].  He needs to know which vaccines he needs.  He is originally from there.  He is going for 3 weeks to visit family.  He leaves on Monday.  He has only had 1 hep a vaccine but he does not want another one today.   History is obtained by patient.  Review of Systems  Constitutional: Negative for chills and fever.  Skin: Negative for rash.    Patient Active Problem List   Diagnosis Date Noted  . Annual physical exam 04/03/2017    Current Outpatient Medications on File Prior to Visit  Medication Sig Dispense Refill  . Fish Oil OIL by Does not apply route.    . Multiple Vitamins-Minerals (MENS 50+ MULTI VITAMIN/MIN PO) Take by mouth.     No current facility-administered medications on file prior to visit.     Allergies  Allergen Reactions  . Sulfa Antibiotics Other (See Comments)    Nausea and eye swelling     Past Medical History:  Diagnosis Date  . Arthritis    Social History   Social History Narrative  . Not on file   Social History   Tobacco Use  . Smoking status: Never Smoker  . Smokeless tobacco: Never Used  Substance Use Topics  . Alcohol use: No  . Drug use: No   family history includes Hypertension in his mother.     Objective:  BP 120/74   Pulse (!) 57   Temp 98.6 F (37 C) (Oral)   Resp 18   Ht 5\' 7"  (1.702 m)   Wt 172 lb 6.4 oz (78.2 kg)   SpO2 100%   BMI 27.00 kg/m  Body mass index is 27 kg/m.  Physical Exam  Constitutional: He is oriented to person, place, and time and well-developed, well-nourished, and in no distress.  HENT:  Head: Normocephalic and atraumatic.  Right Ear: External ear normal.  Left Ear: External ear normal.  Eyes: Conjunctivae are normal.  Neck: Normal  range of motion.  Cardiovascular: Normal rate, regular rhythm and normal heart sounds.  No murmur heard. Pulmonary/Chest: Effort normal and breath sounds normal. He has no wheezes.  Neurological: He is alert and oriented to person, place, and time. Gait normal.  Skin: Skin is warm and dry.  Psychiatric: Mood, memory, affect and judgment normal.    Assessment and Plan :  Need for malaria prophylaxis - Plan: atovaquone-proguanil (MALARONE) 250-100 MG TABS tablet  Need for prophylactic vaccination with typhoid-paratyphoid alone (TAB) - Plan: typhoid (VIVOTIF) DR capsule  How to take and what to expect is discussed with patient.  Benny LennertSarah Weber PA-C  [facility] at [facility] 03/03/2018 8:48 AM

## 2018-03-03 NOTE — Patient Instructions (Addendum)
  Typhoid - take a pill every other day for 4 doses  Malaria - malarone - start today and take one every day with food while you are gone and then for a week after you return   IF you received an x-ray today, you will receive an invoice from [facility]. Please contact [facility] at [phone] with questions or concerns regarding your invoice.   IF you received labwork today, you will receive an invoice from [facility]. Please contact [facility] at [phone] with questions or concerns regarding your invoice.   Our billing staff will not be able to assist you with questions regarding bills from these companies.  You will be contacted with the lab results as soon as they are available. The fastest way to get your results is to activate your My Chart account. Instructions are located on the last page of this paperwork. If you have not heard from us regarding the results in 2 weeks, please contact this office.

## 2018-04-24 ENCOUNTER — Ambulatory Visit: Payer: No Typology Code available for payment source | Admitting: Family Medicine

## 2018-04-24 ENCOUNTER — Encounter: Payer: Self-pay | Admitting: Family Medicine

## 2018-04-24 VITALS — BP 130/66 | HR 64 | Temp 97.7°F | Ht 67.0 in | Wt 169.2 lb

## 2018-04-24 DIAGNOSIS — J069 Acute upper respiratory infection, unspecified: Secondary | ICD-10-CM

## 2018-04-24 DIAGNOSIS — R05 Cough: Secondary | ICD-10-CM

## 2018-04-24 DIAGNOSIS — R059 Cough, unspecified: Secondary | ICD-10-CM

## 2018-04-24 MED ORDER — HYDROCODONE-HOMATROPINE 5-1.5 MG/5ML PO SYRP: ORAL_SOLUTION | ORAL | 0 refills | Status: DC

## 2018-04-24 NOTE — Patient Instructions (Addendum)
Saline nasal spray atleast 4 times per day, over the counter mucinex or mucinex DM during the day for cough, tylenol if needed for aches, hydrocodone cough syrup at night if needed.  Make sure to drink plenty of fluids.  Return to the clinic or go to the nearest emergency room if any of your symptoms worsen or new symptoms occur.   Upper Respiratory Infection, Adult Most upper respiratory infections (URIs) are caused by a virus. A URI affects the nose, throat, and upper air passages. The most common type of URI is often called "the common cold." Follow these instructions at home:  Take medicines only as told by your doctor.  Gargle warm saltwater or take cough drops to comfort your throat as told by your doctor.  Use a warm mist humidifier or inhale steam from a shower to increase air moisture. This may make it easier to breathe.  Drink enough fluid to keep your pee (urine) clear or pale yellow.  Eat soups and other clear broths.  Have a healthy diet.  Rest as needed.  Go back to work when your fever is gone or your doctor says it is okay. ? You may need to stay home longer to avoid giving your URI to others. ? You can also wear a face mask and wash your hands often to prevent spread of the virus.  Use your inhaler more if you have asthma.  Do not use any tobacco products, including cigarettes, chewing tobacco, or electronic cigarettes. If you need help quitting, ask your doctor. Contact a doctor if:  You are getting worse, not better.  Your symptoms are not helped by medicine.  You have chills.  You are getting more short of breath.  You have brown or red mucus.  You have yellow or brown discharge from your nose.  You have pain in your face, especially when you bend forward.  You have a fever.  You have puffy (swollen) neck glands.  You have pain while swallowing.  You have white areas in the back of your throat. Get help right away if:  You have very bad or  constant: ? Headache. ? Ear pain. ? Pain in your forehead, behind your eyes, and over your cheekbones (sinus pain). ? Chest pain.  You have long-lasting (chronic) lung disease and any of the following: ? Wheezing. ? Long-lasting cough. ? Coughing up blood. ? A change in your usual mucus.  You have a stiff neck.  You have changes in your: ? Vision. ? Hearing. ? Thinking. ? Mood. This information is not intended to replace advice given to you by your health care provider. Make sure you discuss any questions you have with your health care provider. Document Released: 05/16/2008 Document Revised: 07/31/2016 Document Reviewed: 03/05/2014 Elsevier Interactive Patient Education  2018 Elsevier Inc.  Cough, Adult Coughing is a reflex that clears your throat and your airways. Coughing helps to heal and protect your lungs. It is normal to cough occasionally, but a cough that happens with other symptoms or lasts a long time may be a sign of a condition that needs treatment. A cough may last only 2-3 weeks (acute), or it may last longer than 8 weeks (chronic). What are the causes? Coughing is commonly caused by:  Breathing in substances that irritate your lungs.  A viral or bacterial respiratory infection.  Allergies.  Asthma.  Postnasal drip.  Smoking.  Acid backing up from the stomach into the esophagus (gastroesophageal reflux).  Certain medicines.  Chronic lung problems, including COPD (or rarely, lung cancer).  Other medical conditions such as heart failure.  Follow these instructions at home: Pay attention to any changes in your symptoms. Take these actions to help with your discomfort:  Take medicines only as told by your health care provider. ? If you were prescribed an antibiotic medicine, take it as told by your health care provider. Do not stop taking the antibiotic even if you start to feel better. ? Talk with your health care provider before you take a cough  suppressant medicine.  Drink enough fluid to keep your urine clear or pale yellow.  If the air is dry, use a cold steam vaporizer or humidifier in your bedroom or your home to help loosen secretions.  Avoid anything that causes you to cough at work or at home.  If your cough is worse at night, try sleeping in a semi-upright position.  Avoid cigarette smoke. If you smoke, quit smoking. If you need help quitting, ask your health care provider.  Avoid caffeine.  Avoid alcohol.  Rest as needed.  Contact a health care provider if:  You have new symptoms.  You cough up pus.  Your cough does not get better after 2-3 weeks, or your cough gets worse.  You cannot control your cough with suppressant medicines and you are losing sleep.  You develop pain that is getting worse or pain that is not controlled with pain medicines.  You have a fever.  You have unexplained weight loss.  You have night sweats. Get help right away if:  You cough up blood.  You have difficulty breathing.  Your heartbeat is very fast. This information is not intended to replace advice given to you by your health care provider. Make sure you discuss any questions you have with your health care provider. Document Released: 05/27/2011 Document Revised: 05/05/2016 Document Reviewed: 02/04/2015 Elsevier Interactive Patient Education  2018 [employer].     IF you received an x-ray today, you will receive an invoice from [facility]. Please contact [facility] at [phone] with questions or concerns regarding your invoice.   IF you received labwork today, you will receive an invoice from [facility]. Please contact [facility] at [phone] with questions or concerns regarding your invoice.   Our billing staff will not be able to assist you with questions regarding bills from these companies.  You will be contacted with the lab results as soon as they are available. The fastest way to get  your results is to activate your My Chart account. Instructions are located on the last page of this paperwork. If you have not heard from [place] regarding the results in 2 weeks, please contact this office.

## 2018-04-24 NOTE — Progress Notes (Signed)
Subjective:  By signing my name below, I, [name], attest that this documentation has been prepared under the direction and in the presence of [name], MD Electronically Signed: Charline Bills, ED Scribe 04/24/2018 at 11:52 AM.   Patient ID: Donald [name], male    DOB: 1956-09-20, 62 y.o.   MRN: [id number]  Chief Complaint  Patient presents with  . URI    cough, congestion, sore throat from coughing, restless night. X3days   HPI Donald [name] is a 62 y.o. male who presents to Primary Care at [facility] complaining of rhinorrhea, nasal congestion, gradually worsening cough, sore throat and chest soreness only with coughing x 3-4 days. He states cough keeps him up at night. No treatments tried PTA. Denies fever. Pt states that his granddaughter was ill with similar symptoms ~5 days ago. He does not take any prescription meds and does not have any pertinent medical hx. Pt is a nonsmoker.  Patient Active Problem List   Diagnosis Date Noted  . Annual physical exam 04/03/2017   Past Medical History:  Diagnosis Date  . Arthritis    Past Surgical History:  Procedure Laterality Date  . APPENDECTOMY     Allergies  Allergen Reactions  . Sulfa Antibiotics Other (See Comments)    Nausea and eye swelling    Prior to Admission medications   Medication Sig Start Date End Date Taking? Authorizing Provider  atovaquone-proguanil (MALARONE) 250-100 MG TABS tablet Take 1 tablet by mouth daily. 03/03/18   Weber, Dema Severin, PA-C  Fish Oil OIL by Does not apply route.    [provider]  Multiple Vitamins-Minerals (MENS 50+ MULTI VITAMIN/MIN PO) Take by mouth.    [provider]   Social History   Socioeconomic History  . Marital status: Married    Spouse name: Not on file  . Number of children: Not on file  . Years of education: Not on file  . Highest education level: Not on file  Occupational History  . Not on file  Social Needs  . Financial resource  strain: Not on file  . Food insecurity:    Worry: Not on file    Inability: Not on file  . Transportation needs:    Medical: Not on file    Non-medical: Not on file  Tobacco Use  . Smoking status: Never Smoker  . Smokeless tobacco: Never Used  Substance and Sexual Activity  . Alcohol use: No  . Drug use: No  . Sexual activity: Yes    Birth control/protection: None  Lifestyle  . Physical activity:    Days per week: Not on file    Minutes per session: Not on file  . Stress: Not on file  Relationships  . Social connections:    Talks on phone: Not on file    Gets together: Not on file    Attends religious service: Not on file    Active member of club or organization: Not on file    Attends meetings of clubs or organizations: Not on file    Relationship status: Not on file  . Intimate partner violence:    Fear of current or ex partner: Not on file    Emotionally abused: Not on file    Physically abused: Not on file    Forced sexual activity: Not on file  Other Topics Concern  . Not on file  Social History Narrative  . Not on file   Review of Systems  Constitutional: Negative for fever.  HENT: Positive for congestion, rhinorrhea and sore throat.   Respiratory: Positive for cough.   Cardiovascular: Positive for chest pain (soreness; only with coughing).      Objective:   Physical Exam  Constitutional: He is oriented to person, place, and time. He appears well-developed and well-nourished.  HENT:  Head: Normocephalic and atraumatic.  Right Ear: Tympanic membrane, external ear and ear canal normal.  [name] Ear: Tympanic membrane, external ear and ear canal normal.  Nose: No rhinorrhea. Right sinus exhibits no maxillary sinus tenderness and no frontal sinus tenderness. [name] sinus exhibits no maxillary sinus tenderness and no frontal sinus tenderness.  Mouth/Throat: Oropharynx is clear and moist and mucous membranes are normal. No oropharyngeal exudate or posterior  oropharyngeal erythema.  No focal sinus tenderness.  Eyes: Pupils are equal, round, and reactive to light. Conjunctivae are normal.  Neck: Neck supple.  Cardiovascular: Normal rate, regular rhythm, normal heart sounds and intact distal pulses.  No murmur heard. Pulmonary/Chest: Effort normal and breath sounds normal. He has no wheezes. He has no rhonchi. He has no rales.  Abdominal: Soft. There is no tenderness.  Lymphadenopathy:    He has no cervical adenopathy.  Neurological: He is alert and oriented to person, place, and time.  Skin: Skin is warm and dry. No rash noted.  Psychiatric: He has a normal mood and affect. His behavior is normal.  Vitals reviewed.  Vitals:   04/24/18 1129  BP: 130/66  Pulse: 64  Temp: 97.7 F (36.5 C)  TempSrc: Oral  SpO2: 98%  Weight: 169 lb 3.2 oz (76.7 kg)  Height:  (1.702 m)      Assessment & Plan:    Vinay Ertl is a 62 y.o. male Acute upper respiratory infection  Cough - Plan: HYDROcodone-homatropine (HYCODAN) 5-1.5 MG/5ML syrup Suspected viral URI, with sick contact.  reassuring vitals and exam.   - saline nasal spray, mucinex during day, other symptomatic care discussed.   -HYCODAN at bedtime if needed. Tylenol for myalgias - chest soreness with coughing only  - handout given and RTC precautions discussed.   Meds ordered this encounter  Medications  . HYDROcodone-homatropine (HYCODAN) 5-1.5 MG/5ML syrup    Sig: 43m by mouth a bedtime as needed for cough.    Dispense:  120 mL    Refill:  0   Patient Instructions   Saline nasal spray atleast 4 times per day, over the counter mucinex or mucinex DM during the day for cough, tylenol if needed for aches, hydrocodone cough syrup at night if needed.  Make sure to drink plenty of fluids.  Return to the clinic or go to the nearest emergency room if any of your symptoms worsen or new symptoms occur.   Upper Respiratory Infection, Adult Most upper respiratory infections (URIs) are  caused by a virus. A URI affects the nose, throat, and upper air passages. The most common type of URI is often called "the common cold." Follow these instructions at home:  Take medicines only as told by your doctor.  Gargle warm saltwater or take cough drops to comfort your throat as told by your doctor.  Use a warm mist humidifier or inhale steam from a shower to increase air moisture. This may make it easier to breathe.  Drink enough fluid to keep your pee (urine) clear or pale yellow.  Eat soups and other clear broths.  Have a healthy diet.  Rest as needed.  Go back to work when your fever is gone or your  doctor says it is okay. ? You may need to stay home longer to avoid giving your URI to others. ? You can also wear a face mask and wash your hands often to prevent spread of the virus.  Use your inhaler more if you have asthma.  Do not use any tobacco products, including cigarettes, chewing tobacco, or electronic cigarettes. If you need help quitting, ask your doctor. Contact a doctor if:  You are getting worse, not better.  Your symptoms are not helped by medicine.  You have chills.  You are getting more short of breath.  You have brown or red mucus.  You have yellow or brown discharge from your nose.  You have pain in your face, especially when you bend forward.  You have a fever.  You have puffy (swollen) neck glands.  You have pain while swallowing.  You have white areas in the back of your throat. Get help right away if:  You have very bad or constant: ? Headache. ? Ear pain. ? Pain in your forehead, behind your eyes, and over your cheekbones (sinus pain). ? Chest pain.  You have long-lasting (chronic) lung disease and any of the following: ? Wheezing. ? Long-lasting cough. ? Coughing up blood. ? A change in your usual mucus.  You have a stiff neck.  You have changes in your: ? Vision. ? Hearing. ? Thinking. ? Mood. This information is  not intended to replace advice given to you by your health care provider. Make sure you discuss any questions you have with your health care provider. Document Released: 05/16/2008 Document Revised: 07/31/2016 Document Reviewed: 03/05/2014 Elsevier Interactive Patient Education  2018 [employer]  Cough, Adult Coughing is a reflex that clears your throat and your airways. Coughing helps to heal and protect your lungs. It is normal to cough occasionally, but a cough that happens with other symptoms or lasts a long time may be a sign of a condition that needs treatment. A cough may last only 2-3 weeks (acute), or it may last longer than 8 weeks (chronic). What are the causes? Coughing is commonly caused by:  Breathing in substances that irritate your lungs.  A viral or bacterial respiratory infection.  Allergies.  Asthma.  Postnasal drip.  Smoking.  Acid backing up from the stomach into the esophagus (gastroesophageal reflux).  Certain medicines.  Chronic lung problems, including COPD (or rarely, lung cancer).  Other medical conditions such as heart failure.  Follow these instructions at home: Pay attention to any changes in your symptoms. Take these actions to help with your discomfort:  Take medicines only as told by your health care provider. ? If you were prescribed an antibiotic medicine, take it as told by your health care provider. Do not stop taking the antibiotic even if you start to feel better. ? Talk with your health care provider before you take a cough suppressant medicine.  Drink enough fluid to keep your urine clear or pale yellow.  If the air is dry, use a cold steam vaporizer or humidifier in your bedroom or your home to help loosen secretions.  Avoid anything that causes you to cough at work or at home.  If your cough is worse at night, try sleeping in a semi-upright position.  Avoid cigarette smoke. If you smoke, quit smoking. If you need help quitting,  ask your health care provider.  Avoid caffeine.  Avoid alcohol.  Rest as needed.  Contact a health care provider if:  You  have new symptoms.  You cough up pus.  Your cough does not get better after 2-3 weeks, or your cough gets worse.  You cannot control your cough with suppressant medicines and you are losing sleep.  You develop pain that is getting worse or pain that is not controlled with pain medicines.  You have a fever.  You have unexplained weight loss.  You have night sweats. Get help right away if:  You cough up blood.  You have difficulty breathing.  Your heartbeat is very fast. This information is not intended to replace advice given to you by your health care provider. Make sure you discuss any questions you have with your health care provider. Document Released: 05/27/2011 Document Revised: 05/05/2016 Document Reviewed: 02/04/2015 Elsevier Interactive Patient Education  2018 [employer].     IF you received an x-ray today, you will receive an invoice from [facility]. Please contact [facility] at [phone] with questions or concerns regarding your invoice.   IF you received labwork today, you will receive an invoice from [facility]. Please contact [facility] at [phone] with questions or concerns regarding your invoice.   Our billing staff will not be able to assist you with questions regarding bills from these companies.  You will be contacted with the lab results as soon as they are available. The fastest way to get your results is to activate your My Chart account. Instructions are located on the last page of this paperwork. If you have not heard from [place] regarding the results in 2 weeks, please contact this office.       I personally performed the services described in this documentation, which was scribed in my presence. The recorded information has been reviewed and considered for accuracy and completeness, addended by me as  needed, and agree with information above.  Signed,   Meredith Staggers, MD Primary Care at [facility].  04/24/18 12:08 PM

## 2018-05-03 ENCOUNTER — Ambulatory Visit: Payer: No Typology Code available for payment source | Admitting: Family Medicine

## 2018-05-03 ENCOUNTER — Encounter: Payer: Self-pay | Admitting: Family Medicine

## 2018-05-03 ENCOUNTER — Other Ambulatory Visit: Payer: Self-pay

## 2018-05-03 VITALS — BP 102/66 | HR 70 | Temp 97.9°F | Ht 68.0 in | Wt 166.2 lb

## 2018-05-03 DIAGNOSIS — Z131 Encounter for screening for diabetes mellitus: Secondary | ICD-10-CM

## 2018-05-03 DIAGNOSIS — R059 Cough, unspecified: Secondary | ICD-10-CM

## 2018-05-03 DIAGNOSIS — Z1211 Encounter for screening for malignant neoplasm of colon: Secondary | ICD-10-CM

## 2018-05-03 DIAGNOSIS — Z114 Encounter for screening for human immunodeficiency virus [HIV]: Secondary | ICD-10-CM

## 2018-05-03 DIAGNOSIS — Z125 Encounter for screening for malignant neoplasm of prostate: Secondary | ICD-10-CM | POA: Diagnosis not present

## 2018-05-03 DIAGNOSIS — Z1329 Encounter for screening for other suspected endocrine disorder: Secondary | ICD-10-CM

## 2018-05-03 DIAGNOSIS — Z130 Encounter for screening for diseases of the blood and blood-forming organs and certain disorders involving the immune mechanism: Secondary | ICD-10-CM

## 2018-05-03 DIAGNOSIS — J22 Unspecified acute lower respiratory infection: Secondary | ICD-10-CM

## 2018-05-03 DIAGNOSIS — R05 Cough: Secondary | ICD-10-CM

## 2018-05-03 DIAGNOSIS — R634 Abnormal weight loss: Secondary | ICD-10-CM | POA: Diagnosis not present

## 2018-05-03 MED ORDER — AZITHROMYCIN 250 MG PO TABS: ORAL_TABLET | ORAL | 0 refills | Status: DC

## 2018-05-03 MED ORDER — BENZONATATE 100 MG PO CAPS: 100.0000 mg | ORAL_CAPSULE | Freq: Three times a day (TID) | ORAL | 0 refills | Status: DC | PRN

## 2018-05-03 NOTE — Progress Notes (Signed)
Subjective:  By signing my name below, I, Donald [name], attest that this documentation has been prepared under the direction and in the presence of [name], MD Electronically Signed: Charline Bills, ED Scribe 05/03/2018 at 5:19 PM.   Patient ID: Donald [name], male    DOB: 02/23/1956, 62 y.o.   MRN: [id number]  Chief Complaint  Patient presents with  . Cough and scrachy throat    going 4 weeks.   . Weight Loss    gradually lossing weight. noticed before going to [place]. But really notice when he was there   HPI Donald [name] is a 62 y.o. male who presents to Primary Care at [facility] complaining of multiple concerns.  Cough Pt was seen 9 days ago with 3-4 days h/o cough, congestion, st, afebrile at that time. Reassuring exam. Suspected viral URI. Hycodan prescribed for cough. Other symptomatic care discussed. - Pt feels that cough is unchanged and occasionally productive with yellow sputum. Also reports associated symptoms of rhinorrhea, postnasal drip. He has been taking hycodan cough syrup at night, no treatments tried during the day. Denies fever, sob, hemoptysis. No h/o tobacco use.  Weight Loss Pt wonders if weight loss is based on his diet but states he loves oatmeal and eats a lot of it. States all of his clothes are too large for him now and everyone has noticed weight loss. States his clothes are getting looser but the scale hasn't moved much. Denies night sweats, abdominal pain, diarrhea, change in stools, difficulty urinating. Pt returned from [place] in 4/17.  Wt Readings from Last 3 Encounters:  05/03/18 166 lb 3.2 oz (75.4 kg)  04/24/18 169 lb 3.2 oz (76.7 kg)  03/03/18 172 lb 6.4 oz (78.2 kg)  Body mass index is 25.27 kg/m.   CA Screening No family h/o CA. Colon CA Screening: Colonoscopy 09/2005 with internal non-bleeding hemorrhoids and 1 single diverticulum in sigmoid colon. Referred for rpt colonoscopy in 2017. Of note, he was treated for possible  roundworm infection at 04/15/16 visit. Was having a few lb weight loss at that time from 172 to 169. 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 - Pt has not had another colonoscopy since 2006. Prostate CA screening:  Lab Results  Component Value Date   PSA 2.08 04/15/2016   PSA 2.36 04/28/2015   HIV screening: non-reactive 04/15/16  Memory Loss At the end of the visit, pt reports concerns of memory loss over the past few months. He plans to return to discuss this further.  Patient Active Problem List   Diagnosis Date Noted  . Annual physical exam 04/03/2017   Past Medical History:  Diagnosis Date  . Arthritis    Past Surgical History:  Procedure Laterality Date  . APPENDECTOMY     Allergies  Allergen Reactions  . Sulfa Antibiotics Other (See Comments)    Nausea and eye swelling    Prior to Admission medications   Medication Sig Start Date End Date Taking? Authorizing Provider  Fish Oil OIL by Does not apply route.    [provider]  HYDROcodone-homatropine (HYCODAN) 5-1.5 MG/5ML syrup 25m by mouth a bedtime as needed for cough. 04/24/18   [name], MD  Multiple Vitamins-Minerals (MENS 50+ MULTI VITAMIN/MIN PO) Take by mouth.    [provider]   Social History   Socioeconomic History  . Marital status: Married    Spouse name: Not on file  . Number of children: Not on file  .  Years of education: Not on file  . Highest education level: Not on file  Occupational History  . Not on file  Social Needs  . Financial resource strain: Not on file  . Food insecurity:    Worry: Not on file    Inability: Not on file  . Transportation needs:    Medical: Not on file    Non-medical: Not on file  Tobacco Use  . Smoking status: Never Smoker  . Smokeless tobacco: Never Used  Substance and Sexual Activity  . Alcohol use: No  . Drug use: No  . Sexual activity: Yes    Birth control/protection: None  Lifestyle  . Physical  activity:    Days per week: Not on file    Minutes per session: Not on file  . Stress: Not on file  Relationships  . Social connections:    Talks on phone: Not on file    Gets together: Not on file    Attends religious service: Not on file    Active member of club or organization: Not on file    Attends meetings of clubs or organizations: Not on file    Relationship status: Not on file  . Intimate partner violence:    Fear of current or ex partner: Not on file    Emotionally abused: Not on file    Physically abused: Not on file    Forced sexual activity: Not on file  Other Topics Concern  . Not on file  Social History Narrative  . Not on file   Review of Systems  Constitutional: Positive for unexpected weight change. Negative for diaphoresis and fever.  HENT: Positive for postnasal drip and rhinorrhea.   Respiratory: Positive for cough. Negative for shortness of breath.   Gastrointestinal: Negative for abdominal pain, blood in stool and diarrhea.  Genitourinary: Negative for difficulty urinating.      Objective:   Physical Exam  Constitutional: He is oriented to person, place, and time. He appears well-developed and well-nourished.  HENT:  Head: Normocephalic and atraumatic.  Right Ear: Tympanic membrane, external ear and ear canal normal.  [name] Ear: Tympanic membrane, external ear and ear canal normal.  Nose: Nose normal. No rhinorrhea.  Mouth/Throat: Oropharynx is clear and moist and mucous membranes are normal. No oropharyngeal exudate or posterior oropharyngeal erythema.  Eyes: Pupils are equal, round, and reactive to light. Conjunctivae are normal.  Neck: Neck supple. No thyroid mass and no thyromegaly present.  Cardiovascular: Normal rate, regular rhythm, normal heart sounds and intact distal pulses.  No murmur heard. Pulmonary/Chest: Effort normal and breath sounds normal. He has no wheezes. He has no rhonchi. He has no rales.  Abdominal: Soft. There is no  tenderness.  Lymphadenopathy:    He has no cervical adenopathy.  Neurological: He is alert and oriented to person, place, and time.  Skin: Skin is warm and dry. No rash noted.  Psychiatric: He has a normal mood and affect. His behavior is normal.  Vitals reviewed.  Vitals:   05/03/18 1637  BP: 102/66  Pulse: 70  Temp: 97.9 F (36.6 C)  TempSrc: Oral  SpO2: 100%  Weight: 166 lb 3.2 oz (75.4 kg)  Height:  (1.727 m)      Assessment & Plan:    Donald [name] is a 62 y.o. male Cough - Plan: azithromycin (ZITHROMAX) 250 MG tablet, benzonatate (TESSALON) 100 MG capsule LRTI (lower respiratory tract infection) - Plan: azithromycin (ZITHROMAX) 250 MG tablet, benzonatate (TESSALON) 100 MG capsule  -  still possible viral illness with persistent cough.  Continue symptomatic care with Tessalon and Mucinex, but if not improving into next week, Z-Pak provided for possible secondary  Infection/bronchitis.  RTC precautions/ER precautions if worsening as may need imaging.  Screening for prostate cancer - Plan: PSA Screen for colon cancer Loss of weight - Plan: CANCELED: PSA, CANCELED: Comprehensive metabolic panel, CANCELED: CBC, CANCELED: TSH, CANCELED: HIV antibody Screening, anemia, deficiency, iron - Plan: CBC, CANCELED: CBC Screening for thyroid disorder - Plan: TSH, CANCELED: TSH Screening for HIV (human immunodeficiency virus) - Plan: HIV antibody, CANCELED: HIV antibody Screening for diabetes mellitus - Plan: CBC, Comprehensive metabolic panel, CANCELED: Comprehensive metabolic panel  -Reported history of weight loss/redistribution of weight.  Minimal changes as above, but will initially check for anemia, screening for diabetes, HIV, other testing as above.  Additionally PSA obtained for prostate cancer screening, and follow-up with gastroenterology for colon cancer screening follow-up in 2 weeks to discuss potential other testing/work-up at that time.  -Briefly mentioned possible  concerns of memory over the years at the end of his visit.  Denies acute changes in memory.  Plans to follow-up to discuss these concerns further  Meds ordered this encounter  Medications  . azithromycin (ZITHROMAX) 250 MG tablet    Sig: Take 2 pills by mouth on day 1, then 1 pill by mouth per day on days 2 through 5.    Dispense:  6 tablet    Refill:  0  . benzonatate (TESSALON) 100 MG capsule    Sig: Take 1 capsule (100 mg total) by mouth 3 (three) times daily as needed for cough.    Dispense:  20 capsule    Refill:  0   Patient Instructions   For cough - try mucinex once per day in the morning.  Tessalon perles up to 3 times per day. If cough is not improving in next week - can fill the antibiotic for possible bacterial infection.  If fever, worsening cough, shortness of breath or other worsening symptoms please return for recheck.  For weight loss, I would initially like to check some blood work, but also should follow up with gastroenterology as you are due for repeat colonoscopy. I will refer you. Please follow up to discuss weight loss further along with results from blood tests in 2 weeks.   We can also discuss any memory concerns at next visit as well.   Return to the clinic or go to the nearest emergency room if any of your symptoms worsen or new symptoms occur.   Cough, Adult Coughing is a reflex that clears your throat and your airways. Coughing helps to heal and protect your lungs. It is normal to cough occasionally, but a cough that happens with other symptoms or lasts a long time may be a sign of a condition that needs treatment. A cough may last only 2-3 weeks (acute), or it may last longer than 8 weeks (chronic). What are the causes? Coughing is commonly caused by:  Breathing in substances that irritate your lungs.  A viral or bacterial respiratory infection.  Allergies.  Asthma.  Postnasal drip.  Smoking.  Acid backing up from the stomach into the esophagus  (gastroesophageal reflux).  Certain medicines.  Chronic lung problems, including COPD (or rarely, lung cancer).  Other medical conditions such as heart failure.  Follow these instructions at home: Pay attention to any changes in your symptoms. Take these actions to help with your discomfort:  Take medicines only as told  by your health care provider. ? If you were prescribed an antibiotic medicine, take it as told by your health care provider. Do not stop taking the antibiotic even if you start to feel better. ? Talk with your health care provider before you take a cough suppressant medicine.  Drink enough fluid to keep your urine clear or pale yellow.  If the air is dry, use a cold steam vaporizer or humidifier in your bedroom or your home to help loosen secretions.  Avoid anything that causes you to cough at work or at home.  If your cough is worse at night, try sleeping in a semi-upright position.  Avoid cigarette smoke. If you smoke, quit smoking. If you need help quitting, ask your health care provider.  Avoid caffeine.  Avoid alcohol.  Rest as needed.  Contact a health care provider if:  You have new symptoms.  You cough up pus.  Your cough does not get better after 2-3 weeks, or your cough gets worse.  You cannot control your cough with suppressant medicines and you are losing sleep.  You develop pain that is getting worse or pain that is not controlled with pain medicines.  You have a fever.  You have unexplained weight loss.  You have night sweats. Get help right away if:  You cough up blood.  You have difficulty breathing.  Your heartbeat is very fast. This information is not intended to replace advice given to you by your health care provider. Make sure you discuss any questions you have with your health care provider. Document Released: 05/27/2011 Document Revised: 05/05/2016 Document Reviewed: 02/04/2015 Elsevier Interactive Patient Education   2018 [employer].      IF you received an x-ray today, you will receive an invoice from [facility]. Please contact [facility] at [phone] with questions or concerns regarding your invoice.   IF you received labwork today, you will receive an invoice from [place]. Please contact [facility] at [phone] with questions or concerns regarding your invoice.   Our billing staff will not be able to assist you with questions regarding bills from these companies.  You will be contacted with the lab results as soon as they are available. The fastest way to get your results is to activate your My Chart account. Instructions are located on the last page of this paperwork. If you have not heard from [place] regarding the results in 2 weeks, please contact this office.       I personally performed the services described in this documentation, which was scribed in my presence. The recorded information has been reviewed and considered for accuracy and completeness, addended by me as needed, and agree with information above.  Signed,   Meredith Staggers, MD Primary Care at [facility].  05/05/18 5:15 PM

## 2018-05-03 NOTE — Patient Instructions (Addendum)
For cough - try mucinex once per day in the morning.  Tessalon perles up to 3 times per day. If cough is not improving in next week - can fill the antibiotic for possible bacterial infection.  If fever, worsening cough, shortness of breath or other worsening symptoms please return for recheck.  For weight loss, I would initially like to check some blood work, but also should follow up with gastroenterology as you are due for repeat colonoscopy. I will refer you. Please follow up to discuss weight loss further along with results from blood tests in 2 weeks.   We can also discuss any memory concerns at next visit as well.   Return to the clinic or go to the nearest emergency room if any of your symptoms worsen or new symptoms occur.   Cough, Adult Coughing is a reflex that clears your throat and your airways. Coughing helps to heal and protect your lungs. It is normal to cough occasionally, but a cough that happens with other symptoms or lasts a long time may be a sign of a condition that needs treatment. A cough may last only 2-3 weeks (acute), or it may last longer than 8 weeks (chronic). What are the causes? Coughing is commonly caused by:  Breathing in substances that irritate your lungs.  A viral or bacterial respiratory infection.  Allergies.  Asthma.  Postnasal drip.  Smoking.  Acid backing up from the stomach into the esophagus (gastroesophageal reflux).  Certain medicines.  Chronic lung problems, including COPD (or rarely, lung cancer).  Other medical conditions such as heart failure.  Follow these instructions at home: Pay attention to any changes in your symptoms. Take these actions to help with your discomfort:  Take medicines only as told by your health care provider. ? If you were prescribed an antibiotic medicine, take it as told by your health care provider. Do not stop taking the antibiotic even if you start to feel better. ? Talk with your health care provider  before you take a cough suppressant medicine.  Drink enough fluid to keep your urine clear or pale yellow.  If the air is dry, use a cold steam vaporizer or humidifier in your bedroom or your home to help loosen secretions.  Avoid anything that causes you to cough at work or at home.  If your cough is worse at night, try sleeping in a semi-upright position.  Avoid cigarette smoke. If you smoke, quit smoking. If you need help quitting, ask your health care provider.  Avoid caffeine.  Avoid alcohol.  Rest as needed.  Contact a health care provider if:  You have new symptoms.  You cough up pus.  Your cough does not get better after 2-3 weeks, or your cough gets worse.  You cannot control your cough with suppressant medicines and you are losing sleep.  You develop pain that is getting worse or pain that is not controlled with pain medicines.  You have a fever.  You have unexplained weight loss.  You have night sweats. Get help right away if:  You cough up blood.  You have difficulty breathing.  Your heartbeat is very fast. This information is not intended to replace advice given to you by your health care provider. Make sure you discuss any questions you have with your health care provider. Document Released: 05/27/2011 Document Revised: 05/05/2016 Document Reviewed: 02/04/2015 Elsevier Interactive Patient Education  2018 [employer].      IF you received an x-ray today, you  will receive an invoice from [facility]. Please contact [facility] at [phone] with questions or concerns regarding your invoice.   IF you received labwork today, you will receive an invoice from [place]. Please contact [facility] at [phone] with questions or concerns regarding your invoice.   Our billing staff will not be able to assist you with questions regarding bills from these companies.  You will be contacted with the lab results as soon as they are  available. The fastest way to get your results is to activate your My Chart account. Instructions are located on the last page of this paperwork. If you have not heard from [place] regarding the results in 2 weeks, please contact this office.

## 2018-05-04 LAB — CBC
HEMATOCRIT: 40.7 % (ref 38.5–50.0)
Hemoglobin: 13.6 g/dL (ref 13.2–17.1)
MCH: 29.3 pg (ref 27.0–33.0)
MCHC: 33.4 g/dL (ref 32.0–36.0)
MCV: 87.7 fL (ref 80.0–100.0)
MPV: 12 fL (ref 7.5–12.5)
PLATELETS: 341 10*3/uL (ref 140–400)
RBC: 4.64 10*6/uL (ref 4.20–5.80)
RDW: 12.1 % (ref 11.0–15.0)
WBC: 6.9 10*3/uL (ref 3.8–10.8)

## 2018-05-04 LAB — COMPREHENSIVE METABOLIC PANEL
AG RATIO: 1.3 (calc) (ref 1.0–2.5)
ALT: 12 U/L (ref 9–46)
AST: 16 U/L (ref 10–35)
Albumin: 4.4 g/dL (ref 3.6–5.1)
Alkaline phosphatase (APISO): 95 U/L (ref 40–115)
BILIRUBIN TOTAL: 0.5 mg/dL (ref 0.2–1.2)
BUN: 11 mg/dL (ref 7–25)
CALCIUM: 9.9 mg/dL (ref 8.6–10.3)
CO2: 28 mmol/L (ref 20–32)
Chloride: 103 mmol/L (ref 98–110)
Creat: 1.2 mg/dL (ref 0.70–1.25)
GLUCOSE: 91 mg/dL (ref 65–99)
Globulin: 3.4 g/dL (calc) (ref 1.9–3.7)
Potassium: 4.2 mmol/L (ref 3.5–5.3)
SODIUM: 140 mmol/L (ref 135–146)
Total Protein: 7.8 g/dL (ref 6.1–8.1)

## 2018-05-04 LAB — HIV ANTIBODY (ROUTINE TESTING W REFLEX): HIV: NONREACTIVE

## 2018-05-04 LAB — TSH: TSH: 1.64 mIU/L (ref 0.40–4.50)

## 2018-05-04 LAB — PSA: PSA: 2.2 ng/mL (ref <=4.0)

## 2018-05-17 ENCOUNTER — Ambulatory Visit (INDEPENDENT_AMBULATORY_CARE_PROVIDER_SITE_OTHER): Payer: No Typology Code available for payment source | Admitting: Family Medicine

## 2018-05-17 ENCOUNTER — Other Ambulatory Visit: Payer: Self-pay

## 2018-05-17 ENCOUNTER — Encounter: Payer: Self-pay | Admitting: Family Medicine

## 2018-05-17 VITALS — BP 116/60 | HR 72 | Temp 98.6°F | Resp 18 | Ht 68.0 in | Wt 165.6 lb

## 2018-05-17 DIAGNOSIS — R634 Abnormal weight loss: Secondary | ICD-10-CM | POA: Diagnosis not present

## 2018-05-17 DIAGNOSIS — Z1211 Encounter for screening for malignant neoplasm of colon: Secondary | ICD-10-CM

## 2018-05-17 DIAGNOSIS — R413 Other amnesia: Secondary | ICD-10-CM

## 2018-05-17 DIAGNOSIS — R05 Cough: Secondary | ICD-10-CM | POA: Diagnosis not present

## 2018-05-17 DIAGNOSIS — R059 Cough, unspecified: Secondary | ICD-10-CM

## 2018-05-17 NOTE — Patient Instructions (Addendum)
  I 'm glad to hear that the cough has improved. If that does not resolve in the next few weeks, then please return for recheck.  Return to the clinic or go to the nearest emergency room if any of your symptoms worsen or new symptoms occur.  For weight loss, blood work looked ok. I will refer you for colonoscopy.  If you continue to lose weight in the next 4-6 weeks, return for other testing.   Based on our discussion and testing in the office,  I am not concerned about your memory at this time. If you do struggle with memory concerns in the next few months would recommend evaluation with neurology.  Recheck with me in the next 3 months regardless.  Thank you for coming in today.    IF you received an x-ray today, you will receive an invoice from [facility]. Please contact [facility] at [phone] with questions or concerns regarding your invoice.   IF you received labwork today, you will receive an invoice from [facility]. Please contact [facility] at [phone] with questions or concerns regarding your invoice.   Our billing staff will not be able to assist you with questions regarding bills from these companies.  You will be contacted with the lab results as soon as they are available. The fastest way to get your results is to activate your My Chart account. Instructions are located on the last page of this paperwork. If you have not heard from us regarding the results in 2 weeks, please contact this office.

## 2018-05-17 NOTE — Progress Notes (Signed)
Subjective:  By signing my name below, I, [name], attest that this documentation has been prepared under the direction and in the presence of [name], MD Electronically Signed: Charline Bills, ED Scribe 05/17/2018 at 10:57 AM.   Patient ID: Donald [name], male    DOB: [birth year], 62 y.o.   MRN: [id number]  Chief Complaint  Patient presents with  . Cough    follow up pt states should have some results from last time   . Weight Loss    and memory    HPI Donald [name] is a 62 y.o. male who presents to Primary Care at [facility] for f/u of multiple concerns.  Cough Initially seen 5/14, again 5/23. Initially suspected viral URI. Treated with hycodan. Occasional yellow sputum noted 5/23. Recommended Mucinex and tessalon, then azithromycin if not improving. - Pt states that he did not take try tessalon or azithromycin. States he was hesitant to try tessalon due to side-effects. States cough has improved. Denies fever, hemoptysis, sob, difficulty breathing.  Weight Loss See last visit. Weight was 172 in March. Recommended to f/u for rpt colonoscopy as he was overdue. Had a normal PSA of 2.2, stable from prev reading. Normal CBC. Non-reactive HIV. Normal TSH and CMP. No h/o tobacco use. - Reports weight loss over the past 6 months. States he has not seen significant change on the scale but his trousers are all looser. Pt reports normal BMs. Denies diarrhea, constipation, appetite change. [place] colon[place] was in [place]. States he has been exercising consistently. Wt Readings from Last 3 Encounters:  05/17/18 165 lb 9.6 oz (75.1 kg)  05/03/18 166 lb 3.2 oz (75.4 kg)  04/24/18 169 lb 3.2 oz (76.7 kg)   Memory Concerns Briefly mentioned over the past few yrs. Noticed some change in memory at last visit. - Pt states that he forgets where he places objects, even when they're in his hands. Also reports difficulty recalling the name of people he knows very well although he remember their face  and recalls their names later. He is able to find his way around but states that he often has to recalibrate. Denies difficulty balancing checkbooks, increased stress. States he typically handles stress well by praying but states he actually has had a lot of wonderful things going on with him within the past 6 months.  No flowsheet data found.  Patient Active Problem List   Diagnosis Date Noted  . Annual physical exam 04/03/2017   Past Medical History:  Diagnosis Date  . Arthritis    Past Surgical History:  Procedure Laterality Date  . APPENDECTOMY     Allergies  Allergen Reactions  . Sulfa Antibiotics Other (See Comments)    Nausea and eye swelling    Prior to Admission medications   Medication Sig Start Date End Date Taking? Authorizing Provider  azithromycin (ZITHROMAX) 250 MG tablet Take 2 pills by mouth on day 1, then 1 pill by mouth per day on days 2 through 5. 05/03/18   [name], MD  benzonatate (TESSALON) 100 MG capsule Take 1 capsule (100 mg total) by mouth 3 (three) times daily as needed for cough. 05/03/18   [name], MD  Fish Oil OIL by Does not apply route.    [provider]  Multiple Vitamins-Minerals (MENS 50+ MULTI VITAMIN/MIN PO) Take by mouth.    [provider]   Social History   Socioeconomic History  . Marital status: Married    Spouse name: Not on  file  . Number of children: Not on file  . Years of education: Not on file  . Highest education level: Not on file  Occupational History  . Not on file  Social Needs  . Financial resource strain: Not on file  . Food insecurity:    Worry: Not on file    Inability: Not on file  . Transportation needs:    Medical: Not on file    Non-medical: Not on file  Tobacco Use  . Smoking status: Never Smoker  . Smokeless tobacco: Never Used  Substance and Sexual Activity  . Alcohol use: No  . Drug use: No  . Sexual activity: Yes    Birth control/protection: None  Lifestyle    . Physical activity:    Days per week: Not on file    Minutes per session: Not on file  . Stress: Not on file  Relationships  . Social connections:    Talks on phone: Not on file    Gets together: Not on file    Attends religious service: Not on file    Active member of club or organization: Not on file    Attends meetings of clubs or organizations: Not on file    Relationship status: Not on file  . Intimate partner violence:    Fear of current or ex partner: Not on file    Emotionally abused: Not on file    Physically abused: Not on file    Forced sexual activity: Not on file  Other Topics Concern  . Not on file  Social History Narrative  . Not on file   Review of Systems  Constitutional: Positive for unexpected weight change. Negative for appetite change and fever.  Respiratory: Positive for cough (improving). Negative for shortness of breath.   Gastrointestinal: Negative for constipation and diarrhea.  Psychiatric/Behavioral: The patient is not nervous/anxious.       Objective:   Physical Exam  Constitutional: He is oriented to person, place, and time. He appears well-developed and well-nourished. No distress.  HENT:  Head: Normocephalic and atraumatic.  Eyes: Conjunctivae and EOM are normal.  Neck: Neck supple. No tracheal deviation present.  Cardiovascular: Normal rate.  Pulmonary/Chest: Effort normal. No respiratory distress.  Abdominal: Soft. There is no tenderness.  Musculoskeletal: Normal range of motion.  Neurological: He is alert and oriented to person, place, and time.  Skin: Skin is warm and dry.  Psychiatric: He has a normal mood and affect. His behavior is normal.  Nursing note and vitals reviewed.  Vitals:   05/17/18 1017  BP: 116/60  Pulse: 72  Resp: 18  Temp: 98.6 F (37 C)  TempSrc: Oral  SpO2: 99%  Weight: 165 lb 9.6 oz (75.1 kg)  Height: 5\' 8"  (1.727 m)      Assessment & Plan:  Keidrick Murty is a 62 y.o. male Cough  -Improving.   Discussed potential x-ray if not completely resolving next few weeks, especially with reported loss of weight.  Understanding expressed.  Loss of weight - Plan: Ambulatory referral to Gastroenterology Screen for colon cancer - Plan: Ambulatory referral to Gastroenterology  -Recent lab work reassuring with weight loss.  Referred to gastroenterology for colonoscopy.  If continued weight loss, can look into other causes.  Memory difficulty  -Normal memory testing in the office.  Based on description of symptoms may be some component of aging, or other situational stressors that may be affecting his immediate recall.  If persistent symptoms would recommend evaluation with neurology,  follow-up in 3 months regardless.  No orders of the defined types were placed in this encounter.  Patient Instructions    I 'm glad to hear that the cough has improved. If that does not resolve in the next few weeks, then please return for recheck.  Return to the clinic or go to the nearest emergency room if any of your symptoms worsen or new symptoms occur.  For weight loss, blood work looked ok. I will refer you for colonoscopy.  If you continue to lose weight in the next 4-6 weeks, return for other testing.   Based on our discussion and testing in the office,  I am not concerned about your memory at this time. If you do struggle with memory concerns in the next few months would recommend evaluation with neurology.  Recheck with me in the next 3 months regardless.  Thank you for coming in today.    IF you received an x-ray today, you will receive an invoice from [facility]. Please contact [facility] at [phone] with questions or concerns regarding your invoice.   IF you received labwork today, you will receive an invoice from [facility]. Please contact [facility] at [phone] with questions or concerns regarding your invoice.   Our billing staff will not be able to assist you with questions  regarding bills from these companies.  You will be contacted with the lab results as soon as they are available. The fastest way to get your results is to activate your My Chart account. Instructions are located on the last page of this paperwork. If you have not heard from us regarding the results in 2 weeks, please contact this office.      I personally performed the services described in this documentation, which was scribed in my presence. The recorded information has been reviewed and considered for accuracy and completeness, addended by me as needed, and agree with information above.  Signed,   Meredith StaggersJeffrey Khamiya Varin, MD Primary Care at [facility].  05/17/18 1:56 PM

## 2018-06-25 ENCOUNTER — Encounter: Payer: Self-pay | Admitting: Family Medicine

## 2019-05-27 DIAGNOSIS — Z86718 Personal history of other venous thrombosis and embolism: Secondary | ICD-10-CM | POA: Insufficient documentation

## 2019-09-09 ENCOUNTER — Encounter: Payer: No Typology Code available for payment source | Admitting: Family Medicine

## 2019-09-16 ENCOUNTER — Encounter: Payer: No Typology Code available for payment source | Admitting: Family Medicine

## 2019-09-17 ENCOUNTER — Encounter: Payer: Self-pay | Admitting: Family Medicine

## 2019-10-28 ENCOUNTER — Encounter: Payer: Self-pay | Admitting: Family Medicine

## 2019-10-28 ENCOUNTER — Other Ambulatory Visit: Payer: Self-pay

## 2019-10-28 ENCOUNTER — Ambulatory Visit (INDEPENDENT_AMBULATORY_CARE_PROVIDER_SITE_OTHER): Payer: No Typology Code available for payment source | Admitting: Family Medicine

## 2019-10-28 VITALS — BP 126/75 | HR 67 | Temp 98.7°F | Wt 167.8 lb

## 2019-10-28 DIAGNOSIS — Z23 Encounter for immunization: Secondary | ICD-10-CM

## 2019-10-28 DIAGNOSIS — Z0000 Encounter for general adult medical examination without abnormal findings: Secondary | ICD-10-CM

## 2019-10-28 DIAGNOSIS — Z125 Encounter for screening for malignant neoplasm of prostate: Secondary | ICD-10-CM

## 2019-10-28 DIAGNOSIS — Z1211 Encounter for screening for malignant neoplasm of colon: Secondary | ICD-10-CM

## 2019-10-28 DIAGNOSIS — H538 Other visual disturbances: Secondary | ICD-10-CM

## 2019-10-28 DIAGNOSIS — Z131 Encounter for screening for diabetes mellitus: Secondary | ICD-10-CM

## 2019-10-28 DIAGNOSIS — E785 Hyperlipidemia, unspecified: Secondary | ICD-10-CM

## 2019-10-28 DIAGNOSIS — R634 Abnormal weight loss: Secondary | ICD-10-CM

## 2019-10-28 MED ORDER — SHINGRIX 50 MCG/0.5ML IM SUSR: 0.5000 mL | Freq: Once | INTRAMUSCULAR | 1 refills | Status: AC

## 2019-10-28 NOTE — Patient Instructions (Addendum)
I am glad to hear that weight has remained stable and memory has improved.  I will refer you for the colonoscopy as well as to an eye doctor to discuss the blurry vision on the right side.  Other screening tests including prostate blood test were checked today.  Depending on cholesterol reading, can discuss a statin cholesterol medicine if needed but 10-year heart disease risk score on the borderline for recommending that medicine today.  Please follow-up for repeat testing in 6 months.  Thank you for coming in today and let me know if there are questions.  Keeping you healthy  Get these tests  Blood pressure- Have your blood pressure checked once a year by your healthcare provider.  Normal blood pressure is 120/80  Weight- Have your body mass index (BMI) calculated to screen for obesity.  BMI is a measure of body fat based on height and weight. You can also calculate your own BMI at View[employer]ing.si.  Cholesterol- Have your cholesterol checked every year.  Diabetes- Have your blood sugar checked regularly if you have high blood pressure, high cholesterol, have a family history of diabetes or if you are overweight.  Screening for Colon Cancer- Colonoscopy starting at age 80.  Screening may begin sooner depending on your family history and other health conditions. Follow up colonoscopy as directed by your Gastroenterologist.  Screening for Prostate Cancer- Both blood work (PSA) and a rectal exam help screen for Prostate Cancer.  Screening begins at age 31 with African-American men and at age 85 with Caucasian men.  Screening may begin sooner depending on your family history.  Take these medicines  Aspirin- One aspirin daily can help prevent Heart disease and Stroke.  Flu shot- Every fall.  Tetanus- Every 10 years.  Zostavax- Once after the age of 33 to prevent Shingles.  Pneumonia shot- Once after the age of 57; if you are younger than 84, ask your healthcare provider if you need a  Pneumonia shot.  Take these steps  Don't smoke- If you do smoke, talk to your doctor about quitting.  For tips on how to quit, go to www.smokefree.gov or call 1-800-QUIT-NOW.  Be physically active- Exercise 5 days a week for at least 30 minutes.  If you are not already physically active start slow and gradually work up to 30 minutes of moderate physical activity.  Examples of moderate activity include walking briskly, mowing the yard, dancing, swimming, bicycling, etc.  Eat a healthy diet- Eat a variety of healthy food such as fruits, vegetables, low fat milk, low fat cheese, yogurt, lean meant, poultry, fish, beans, tofu, etc. For more information go to www.thenutritionsource.org  Drink alcohol in moderation- Limit alcohol intake to less than two drinks a day. Never drink and drive.  Dentist- Brush and floss twice daily; visit your dentist twice a year.  Depression- Your emotional health is as important as your physical health. If you're feeling down, or losing interest in things you would normally enjoy please talk to your healthcare provider.  Eye exam- Visit your eye doctor every year.  Safe sex- If you may be exposed to a sexually transmitted infection, use a condom.  Seat belts- Seat belts can save your life; always wear one.  Smoke/Carbon Monoxide detectors- These detectors need to be installed on the appropriate level of your home.  Replace batteries at least once a year.  Skin cancer- When out in the sun, cover up and use sunscreen 15 SPF or higher.  Violence- If anyone is  threatening you, please tell your healthcare provider.  Living Will/ Health care power of attorney- Speak with your healthcare provider and family.  If you have lab work done today you will be contacted with your lab results within the next 2 weeks.  If you have not heard from [place] then please contact us. The fastest way to get your results is to register for My Chart.   IF you received an x-ray today, you  will receive an invoice from [facility]. Please contact [facility] at [phone] with questions or concerns regarding your invoice.   IF you received labwork today, you will receive an invoice from [place]. Please contact [facility] at [phone] with questions or concerns regarding your invoice.   Our billing staff will not be able to assist you with questions regarding bills from these companies.  You will be contacted with the lab results as soon as they are available. The fastest way to get your results is to activate your My Chart account. Instructions are located on the last page of this paperwork. If you have not heard from [place] regarding the results in 2 weeks, please contact this office.

## 2019-10-28 NOTE — Progress Notes (Signed)
Subjective:  Patient ID: Donald [name], male    DOB: 06/29/1956  Age: 63 y.o. MRN: [id number]  CC:  Chief Complaint  Patient presents with  . Annual Exam    annual exam with no other issues     HPI Donald Leftmmanuel Schlink presents for  Annual physical exam.  Weight concerns: Discussed in June 2019.  Weight 172 in March, decreased to 165 June 6.  Normal PSA, stable from prior readings, normal CBC, nonreactive HIV, normal TSH, CMP.  Plan for follow-up colonoscopy.  Weight up 2 pounds since last June.  No new weight loss stable.  Did not have colonoscopy - forgot about it.  Wt Readings from Last 3 Encounters:  10/28/19 167 lb 12.8 oz (76.1 kg)  05/17/18 165 lb 9.6 oz (75.1 kg)  05/03/18 166 lb 3.2 oz (75.4 kg)   Memory concern Also discussed last June.  Normal memory testing in office.  Possible situational stressors affecting immediate recall versus normal age component.  Recommended neurology follow-up if persistent symptoms and recommended 11015-month follow-up.  Has not been seen since that time.  Feels like memory is better. No problems with ADL/IADl, no name recognition issues.    Hyperlipidemia: Mild elevation when last checked in 2018.  Not on statin. No FH of heart disease.  Lab Results  Component Value Date   CHOL 191 04/03/2017   HDL 59 04/03/2017   LDLCALC 116 (H) 04/03/2017   TRIG 78 04/03/2017   CHOLHDL 3.2 04/03/2017   Lab Results  Component Value Date   ALT 12 05/03/2018   AST 16 05/03/2018   ALKPHOS 113 04/03/2017   BILITOT 0.5 05/03/2018  The 10-year ASCVD risk score Denman George(Goff DC Jr., et al., 2013) is: 8.1%   Values used to calculate the score:     Age: [age] years     Sex: Male     Is Non-Hispanic African American: Yes     Diabetic: No     Tobacco smoker: No     Systolic Blood Pressure: 126 mmHg     Is BP treated: No     HDL Cholesterol: 59 mg/dL     Total Cholesterol: 191 mg/dL  Cancer screening Overdue for colonoscopy, also discussed last year. Agrees to  referral.  Prostate: PSA 2.2 in May 2019. No FH of cancer. Agrees to testing with PSA only after R/B discussion. Limitations without DRE discussed.    Immunization History  Administered Date(s) Administered  . Hepatitis A, Adult 04/28/2015  Flu vaccine: not yet. Agrees to flu vaccine today.  Tetanus:about 9 yrs ago.  Shingles vaccine- agrees to Rx.    [facility] 2[facility] 10/28/2019 05/17/2018 05/03/2018 04/24/2018 03/03/2018  Decreased Interest 0 0 0 0 0  Down, Depressed, Hopeless 0 0 0 0 0  PHQ - 2 Score 0 0 0 0 0    Hearing Screening   125Hz  250Hz  500Hz  1000Hz  2000Hz  3000Hz  4000Hz  6000Hz  8000Hz   Right ear:           Left ear:             Visual Acuity Screening   Right eye Left eye Both eyes  Without correction:     With correction: 20/70 20/15 20/20   optho: has not seen optho recently - wears glasses - not used for testing today. Blurry on R past year.   Dental: every 6 months.   Exercise: walking with wife 2 days per week, 2 miles.    History Patient Active Problem List  Diagnosis Date Noted  . Annual physical exam 04/03/2017   Past Medical History:  Diagnosis Date  . Arthritis    Past Surgical History:  Procedure Laterality Date  . APPENDECTOMY     Allergies  Allergen Reactions  . Sulfa Antibiotics Other (See Comments)    Nausea and eye swelling    Prior to Admission medications   Medication Sig Start Date End Date Taking? Authorizing Provider  Fish Oil OIL by Does not apply route.   Yes [provider]  Multiple Vitamins-Minerals (MENS 50+ MULTI VITAMIN/MIN PO) Take by mouth.   Yes [provider]   Social History   Socioeconomic History  . Marital status: Married    Spouse name: Not on file  . Number of children: Not on file  . Years of education: Not on file  . Highest education level: Not on file  Occupational History  . Not on file  Social Needs  . Financial resource strain: Not on file  . Food insecurity    Worry:  Not on file    Inability: Not on file  . Transportation needs    Medical: Not on file    Non-medical: Not on file  Tobacco Use  . Smoking status: Never Smoker  . Smokeless tobacco: Never Used  Substance and Sexual Activity  . Alcohol use: No  . Drug use: No  . Sexual activity: Yes    Birth control/protection: None  Lifestyle  . Physical activity    Days per week: Not on file    Minutes per session: Not on file  . Stress: Not on file  Relationships  . Social Herbalist on phone: Not on file    Gets together: Not on file    Attends religious service: Not on file    Active member of club or organization: Not on file    Attends meetings of clubs or organizations: Not on file    Relationship status: Not on file  . Intimate partner violence    Fear of current or ex partner: Not on file    Emotionally abused: Not on file    Physically abused: Not on file    Forced sexual activity: Not on file  Other Topics Concern  . Not on file  Social History Narrative  . Not on file    Review of Systems 13 point review of systems per patient health survey noted.  Negative other than as indicated above or in HPI.    Objective:   Vitals:   10/28/19 1443  BP: 126/75  Pulse: 67  Temp: 98.7 F (37.1 C)  TempSrc: Oral  SpO2: 100%  Weight: 167 lb 12.8 oz (76.1 kg)     Physical Exam Vitals signs reviewed.  Constitutional:      Appearance: He is well-developed.  HENT:     Head: Normocephalic and atraumatic.     Right Ear: External ear normal.     Left Ear: External ear normal.  Eyes:     Conjunctiva/sclera: Conjunctivae normal.     Pupils: Pupils are equal, round, and reactive to light.     Comments: Corneal clouding bilaterally.  Neck:     Musculoskeletal: Normal range of motion and neck supple.     Thyroid: No thyromegaly.  Cardiovascular:     Rate and Rhythm: Normal rate and regular rhythm.     Heart sounds: Normal heart sounds.  Pulmonary:     Effort:  Pulmonary effort is normal. No respiratory  distress.     Breath sounds: Normal breath sounds. No wheezing.  Abdominal:     General: There is no distension.     Palpations: Abdomen is soft.     Tenderness: There is no abdominal tenderness.     Hernia: There is no hernia in the left inguinal area or right inguinal area.  Genitourinary:    Comments: dre declined.  Musculoskeletal: Normal range of motion.        General: No tenderness.  Lymphadenopathy:     Cervical: No cervical adenopathy.  Skin:    General: Skin is warm and dry.  Neurological:     Mental Status: He is alert and oriented to person, place, and time.     Deep Tendon Reflexes: Reflexes are normal and symmetric.  Psychiatric:        Behavior: Behavior normal.      Assessment & Plan:  Eriverto Byrnes is a 63 y.o. male . Annual physical exam  - -anticipatory guidance as below in AVS, screening labs above. Health maintenance items as above in HPI discussed/recommended as applicable.   Special screening for malignant neoplasms, colon - Plan: Ambulatory referral to Gastroenterology  Need for shingles vaccine - Plan: Zoster Vaccine Adjuvanted [facility]) injection  Screening for prostate cancer - Plan: PSA  - We discussed pros and cons of prostate cancer screening, and after this discussion, he chose to have screening done with PSA only.    Blurring, right eye - Plan: Ambulatory referral to Ophthalmology  - refer to optho. Suspicious for cataracts.   Loss of weight  - now stable. Hold on further workup at this time.   Hyperlipidemia, unspecified hyperlipidemia type - Plan: Lipid panel  - borderline ASCVD risk score, repeat testing  Screening for diabetes mellitus - Plan: Comprehensive metabolic panel   No orders of the defined types were placed in this encounter.  Patient Instructions       If you have lab work done today you will be contacted with your lab results within the next 2 weeks.  If you have not  heard from [place] then please contact us. The fastest way to get your results is to register for My Chart.   IF you received an x-ray today, you will receive an invoice from [facility]. Please contact [facility] at [phone] with questions or concerns regarding your invoice.   IF you received labwork today, you will receive an invoice from [facility]. Please contact [facility] at [phone] with questions or concerns regarding your invoice.   Our billing staff will not be able to assist you with questions regarding bills from these companies.  You will be contacted with the lab results as soon as they are available. The fastest way to get your results is to activate your My Chart account. Instructions are located on the last page of this paperwork. If you have not heard from [place] regarding the results in 2 weeks, please contact this office.          Signed, Meredith Staggers, MD [facility]

## 2019-10-29 LAB — COMPREHENSIVE METABOLIC PANEL
ALT: 20 IU/L (ref 0–44)
AST: 26 IU/L (ref 0–40)
Albumin/Globulin Ratio: 1.7 (ref 1.2–2.2)
Albumin: 4.6 g/dL (ref 3.8–4.8)
Alkaline Phosphatase: 119 IU/L — ABNORMAL HIGH (ref 39–117)
BUN/Creatinine Ratio: 9 — ABNORMAL LOW (ref 10–24)
BUN: 10 mg/dL (ref 8–27)
Bilirubin Total: 0.2 mg/dL (ref 0.0–1.2)
CO2: 25 mmol/L (ref 20–29)
Calcium: 9.9 mg/dL (ref 8.6–10.2)
Chloride: 103 mmol/L (ref 96–106)
Creatinine, Ser: 1.08 mg/dL (ref 0.76–1.27)
GFR calc Af Amer: 85 mL/min/{1.73_m2} (ref >59)
GFR calc non Af Amer: 73 mL/min/{1.73_m2} (ref >59)
Globulin, Total: 2.7 g/dL (ref 1.5–4.5)
Glucose: 94 mg/dL (ref 65–99)
Potassium: 4.7 mmol/L (ref 3.5–5.2)
Sodium: 141 mmol/L (ref 134–144)
Total Protein: 7.3 g/dL (ref 6.0–8.5)

## 2019-10-29 LAB — LIPID PANEL
Chol/HDL Ratio: 3.1 ratio (ref 0.0–5.0)
Cholesterol, Total: 211 mg/dL — ABNORMAL HIGH (ref 100–199)
HDL: 69 mg/dL (ref >39)
LDL Chol Calc (NIH): 126 mg/dL — ABNORMAL HIGH (ref 0–99)
Triglycerides: 88 mg/dL (ref 0–149)
VLDL Cholesterol Cal: 16 mg/dL (ref 5–40)

## 2019-10-29 LAB — PSA: Prostate Specific Ag, Serum: 4.7 ng/mL — ABNORMAL HIGH (ref 0.0–4.0)

## 2019-10-30 ENCOUNTER — Encounter: Payer: Self-pay | Admitting: Gastroenterology

## 2019-11-12 ENCOUNTER — Other Ambulatory Visit: Payer: Self-pay | Admitting: Family Medicine

## 2019-11-12 DIAGNOSIS — R972 Elevated prostate specific antigen [PSA]: Secondary | ICD-10-CM

## 2019-11-12 NOTE — Progress Notes (Signed)
See labs 

## 2019-11-25 ENCOUNTER — Telehealth: Payer: Self-pay | Admitting: *Deleted

## 2019-11-25 NOTE — Telephone Encounter (Signed)
Pt no showed 11 am PV Called pt 1120 am - no answer-LMTRC by 5 pm to RS PV or colon 12-28 would be canceled with the PV and Pt could RS both at a later date 1713 pm- no call- mailed no show letter Canceled PV and Colon 12-28, Marie pV

## 2019-12-04 ENCOUNTER — Telehealth: Payer: Self-pay | Admitting: Family Medicine

## 2019-12-04 NOTE — Telephone Encounter (Signed)
Pt would like for his referral to Gastro to be processed again. Pt refused appt because he did not know what it was for. He wants the appt for his colon screening and would like for the office to reach back out and schedule

## 2019-12-09 ENCOUNTER — Encounter: Payer: Self-pay | Admitting: Gastroenterology

## 2019-12-11 ENCOUNTER — Other Ambulatory Visit: Payer: Self-pay

## 2019-12-11 ENCOUNTER — Emergency Department (HOSPITAL_COMMUNITY): Payer: No Typology Code available for payment source

## 2019-12-11 ENCOUNTER — Emergency Department (HOSPITAL_COMMUNITY)
Admission: EM | Admit: 2019-12-11 | Discharge: 2019-12-11 | Disposition: A | Discharge status: Home / Self Care | Payer: No Typology Code available for payment source | Attending: Emergency Medicine | Admitting: Emergency Medicine

## 2019-12-11 DIAGNOSIS — W109XXA Fall (on) (from) unspecified stairs and steps, initial encounter: Secondary | ICD-10-CM | POA: Diagnosis not present

## 2019-12-11 DIAGNOSIS — Y999 Unspecified external cause status: Secondary | ICD-10-CM | POA: Insufficient documentation

## 2019-12-11 DIAGNOSIS — S0003XA Contusion of scalp, initial encounter: Secondary | ICD-10-CM | POA: Insufficient documentation

## 2019-12-11 DIAGNOSIS — Y92019 Unspecified place in single-family (private) house as the place of occurrence of the external cause: Secondary | ICD-10-CM | POA: Insufficient documentation

## 2019-12-11 DIAGNOSIS — Y9301 Activity, walking, marching and hiking: Secondary | ICD-10-CM | POA: Diagnosis not present

## 2019-12-11 DIAGNOSIS — S62114A Nondisplaced fracture of triquetrum [cuneiform] bone, right wrist, initial encounter for closed fracture: Secondary | ICD-10-CM | POA: Insufficient documentation

## 2019-12-11 DIAGNOSIS — S6991XA Unspecified injury of right wrist, hand and finger(s), initial encounter: Secondary | ICD-10-CM | POA: Diagnosis present

## 2019-12-11 DIAGNOSIS — M25532 Pain in left wrist: Secondary | ICD-10-CM | POA: Insufficient documentation

## 2019-12-11 DIAGNOSIS — W19XXXA Unspecified fall, initial encounter: Secondary | ICD-10-CM

## 2019-12-11 MED ORDER — ACETAMINOPHEN 325 MG PO TABS
650.0000 mg | ORAL_TABLET | Freq: Once | ORAL | Status: AC
  Administered 2019-12-11: 650 mg via ORAL
  Filled 2019-12-11: qty 2

## 2019-12-11 NOTE — Progress Notes (Signed)
Orthopedic Tech Progress Note Patient Details:  Donald [name] October 01, 1956 [id number]  Ortho Devices Type of Ortho Device: Volar splint Ortho Device/Splint Location: RUE Ortho Device/Splint Interventions: Application, Ordered   Post Interventions Instructions Provided: Care of device, Adjustment of device   Janit Pagan 12/11/2019, 10:59 AM

## 2019-12-11 NOTE — Discharge Instructions (Addendum)
You have a small fracture of the triquetrum, one of the bones in your right wrist, please remain in splint and keep this clean and dry.  You should follow-up with Dr. Silverio Lay pace the hand specialist who will likely eventually placed you in a cast.  Please call today to schedule follow-up appointment.  Use ibuprofen 400 mg, Tylenol 1000 mg every 6 hours as needed for pain.  Ice and elevate the wrist.  Return for any new or worsening symptoms.

## 2019-12-11 NOTE — ED Provider Notes (Signed)
[facility] EMERGENCY DEPARTMENT Provider Note   CSN: [id number] Arrival date & time: 12/11/19  0546     History Chief Complaint  Patient presents with  . Fall    Donald [name] is a 62 y.o. male.  Donald [name] is a 63 y.o. male with a history of arthritis, otherwise healthy, who presents to the ED for evaluation after a fall that occurred around 5 AM this morning.  Patient states he was at home when coming down his stairs when he tripped falling forward and catching himself over bilateral wrists.  He does state that he scraped the top of his head in the right side of his head on the wall, but did not lose consciousness.  Denies any pre or postevent amnesia.  He has not had any headache since the fall, denies any vision changes.  No nausea or vomiting.  No dizziness.  No numbness or weakness.  No seizure activity.  Patient is not on any blood thinners.  Patient complains of pain primarily in both wrists which is worse on the right than the left.  Patient localizes pain on the right wrist over the ulnar aspect, patient has not noticed any obvious deformities.  Patient states some slight stiffness with range of motion of the neck over each side but no midline neck tenderness.  Denies any back pain.  No chest or abdominal pain.  No pain over his hips or lower extremities.  Patient has been ambulatory since the accident.  States pain is currently very minimal.  Medications prior to arrival.        Past Medical History:  Diagnosis Date  . Arthritis     Patient Active Problem List   Diagnosis Date Noted  . Annual physical exam 04/03/2017    Past Surgical History:  Procedure Laterality Date  . APPENDECTOMY         Family History  Problem Relation Age of Onset  . Hypertension Mother     Social History   Tobacco Use  . Smoking status: Never Smoker  . Smokeless tobacco: Never Used  Substance Use Topics  . Alcohol use: No  . Drug use: No    Home  Medications Prior to Admission medications   Medication Sig Start Date End Date Taking? Authorizing Provider  Fish Oil OIL by Does not apply route.    [provider]  Multiple Vitamins-Minerals (MENS 50+ MULTI VITAMIN/MIN PO) Take by mouth.    [provider]    Allergies    Sulfa antibiotics  Review of Systems   Review of Systems  Constitutional: Negative for chills and fever.  Eyes: Negative for visual disturbance.  Respiratory: Negative for shortness of breath.   Cardiovascular: Negative for chest pain.  Gastrointestinal: Negative for abdominal pain.  Musculoskeletal: Positive for arthralgias. Negative for back pain, joint swelling and neck pain.  Skin: Negative for color change and wound.  Neurological: Negative for dizziness, seizures, syncope, facial asymmetry, speech difficulty, weakness, light-headedness, numbness and headaches.    Physical Exam Updated Vital Signs BP (!) 148/73 (BP Location: Left Arm)   Pulse 69   Temp 98 F (36.7 C) (Oral)   SpO2 95%   Physical Exam Vitals and nursing note reviewed.  Constitutional:      General: He is not in acute distress.    Appearance: Normal appearance. He is well-developed and normal weight. He is not diaphoretic.  HENT:     Head: Normocephalic.     Comments: Very small  hematoma to the frontal scalp with no appreciable step-off or deformity, no ecchymosis, no other hematomas noted, negative battle sign, no raccoon eyes, no CSF otorrhea.  No facial swelling or bony tenderness.    Nose: Nose normal.     Mouth/Throat:     Mouth: Mucous membranes are moist.     Pharynx: Oropharynx is clear.     Comments: No malocclusion of the jaw or bony tenderness. Eyes:     General:        Right eye: No discharge.        Left eye: No discharge.     Extraocular Movements: Extraocular movements intact.     Conjunctiva/sclera: Conjunctivae normal.     Pupils: Pupils are equal, round, and reactive to light.  Neck:      Comments: No midline cervical spine tenderness or palpable step-off or deformity.  Patient states there is some mild tenderness and stiffness over the insertion of the sternocleidomastoid bilaterally with movement of the head, full range of motion of the neck without difficulty Cardiovascular:     Rate and Rhythm: Normal rate and regular rhythm.     Heart sounds: Normal heart sounds. No murmur. No friction rub. No gallop.   Pulmonary:     Effort: Pulmonary effort is normal. No respiratory distress.  Chest:     Chest wall: No tenderness.  Musculoskeletal:     Cervical back: Neck supple.     Comments: There is tenderness over the radial aspect of the right wrist without palpable deformity, range of motion intact with mild pain, 2+ radial pulse, good capillary refill, 5/5 grip strength. No tenderness noted over the left wrist, full range of motion, 2+ radial pulse and 5/5 strength. All other joints are supple Movable, all compartments soft.  Skin:    General: Skin is warm and dry.     Capillary Refill: Capillary refill takes less than 2 seconds.  Neurological:     Mental Status: He is alert and oriented to person, place, and time.     Coordination: Coordination normal.     Comments: Speech is clear, able to follow commands CN III-XII intact Normal strength in upper and lower extremities bilaterally including dorsiflexion and plantar flexion, strong and equal grip strength Sensation normal to light and sharp touch Moves extremities without ataxia, coordination intact  Psychiatric:        Behavior: Behavior normal.     ED Results / Procedures / Treatments   Labs (all labs ordered are listed, but only abnormal results are displayed) Labs Reviewed - No data to display  EKG None  Radiology DG Wrist Complete Left  Result Date: 12/11/2019 CLINICAL DATA:  Trip and fall going down stairs. Bilateral wrist pain. EXAM: LEFT WRIST - COMPLETE 3+ VIEW COMPARISON:  None. FINDINGS: There is no  evidence of fracture or dislocation. Osseous lunotriquetral coalition, incidental. There is no evidence of arthropathy or other focal bone abnormality. Soft tissues are unremarkable. IMPRESSION: 1. No acute fracture or dislocation of the left wrist. 2. Incidental osseous lunotriquetral coalition. Electronically Signed   By: Keith Rake M.D.   On: 12/11/2019 06:28   DG Wrist Complete Right  Result Date: 12/11/2019 CLINICAL DATA:  Trip and fall going down stairs. Bilateral wrist pain. EXAM: RIGHT WRIST - COMPLETE 3+ VIEW COMPARISON:  None. FINDINGS: Possible nondisplaced to triquetrum fracture, demonstrated on the lateral view. No other fracture of the right wrist. Alignment is maintained. Mild degenerative radiocarpal spurring. Mild dorsal soft tissue edema. IMPRESSION:  1. Possible nondisplaced to triquetrum fracture. No other fracture of the right wrist. 2. Mild radiocarpal osteoarthritis. Electronically Signed   By: Narda RutherfordMelanie  Sanford M.D.   On: 12/11/2019 06:27    Procedures Procedures (including critical care time)  Medications Ordered in ED Medications  acetaminophen (TYLENOL) tablet 650 mg (650 mg Oral Given 12/11/19 1011)    ED Course  I have reviewed the triage vital signs and the nursing notes.  Pertinent labs & imaging results that were available during my care of the patient were reviewed by me and considered in my medical decision making (see chart for details).  Clinical Course as of Dec 10 1017  Wed Dec 11, 2019  [id number] 63 year old male presents after mechanical fall this morning in his home where he caught himself on both outstretched wrist.  He did bump his head on the wall, but did not have loss of consciousness and has no focal neurologic deficits.  Using [place] head CT rule head imaging is not indicated.  X-rays of both wrist ordered from triage.  No other injury or trauma noted on exam   [KF]  1015 Left wrist unremarkable with no evidence of fracture  DG Wrist Complete  Left [KF]  1015 Right wrist with possible nondisplaced triquetral fracture, will place patient in volar wrist splint and have him follow-up with Dr. Arita MissPace with hand surgery.  Patient states pain is currently a 0-1/10, will have patient treat pain at home with ibuprofen, Tylenol, ice and elevation.  DG Wrist Complete Right [KF]    Clinical Course User Index [KF] Legrand RamsFord, Cambre Matson N, PA-C    MDM Rules/Calculators/A&P                      Patient presents after mechanical fall catching himself on both wrists, states he did hit his head on the wall when he landed but did not have any loss of consciousness and has no focal neurologic deficits, it is now > 4 hours since incident and patient has not developed any new neurologic symptoms.  Using [place] head CT rule head imaging is not indicated.  Wrist x-ray show a right nondisplaced triquetral fracture, no other injuries.  Patient placed in volar wrist splint and will follow up with hand specialist.  Ibuprofen and Tylenol for pain.  Return precautions discussed.  Discharged home in good condition.  Final Clinical Impression(s) / ED Diagnoses Final diagnoses:  Fall, initial encounter  Closed nondisplaced fracture of triquetrum of right wrist, initial encounter    Rx / DC Orders ED Discharge Orders    None       Legrand RamsFord, Ivyana Locey N, PA-C 12/11/19 1022    Benjiman CorePickering, Nathan, MD 12/11/19 1537

## 2019-12-11 NOTE — ED Triage Notes (Signed)
PER pt he was at home and was coming down his stairs and tripped over the step and fell down the stairs. Pt scraped his head on the wall and caught himself on both wrist. NO Loc, no blurred vision. No deformities. Pt is not on a blood thinner

## 2019-12-13 DIAGNOSIS — I10 Essential (primary) hypertension: Secondary | ICD-10-CM | POA: Insufficient documentation

## 2019-12-16 ENCOUNTER — Encounter: Payer: Self-pay | Admitting: Family Medicine

## 2019-12-16 ENCOUNTER — Other Ambulatory Visit: Payer: Self-pay

## 2019-12-16 ENCOUNTER — Ambulatory Visit: Payer: No Typology Code available for payment source | Admitting: Family Medicine

## 2019-12-16 VITALS — BP 130/81 | HR 79 | Temp 98.4°F | Ht 68.0 in | Wt 168.2 lb

## 2019-12-16 DIAGNOSIS — Y92009 Unspecified place in unspecified non-institutional (private) residence as the place of occurrence of the external cause: Secondary | ICD-10-CM | POA: Diagnosis not present

## 2019-12-16 DIAGNOSIS — S0003XD Contusion of scalp, subsequent encounter: Secondary | ICD-10-CM

## 2019-12-16 DIAGNOSIS — W19XXXD Unspecified fall, subsequent encounter: Secondary | ICD-10-CM | POA: Diagnosis not present

## 2019-12-16 DIAGNOSIS — M25531 Pain in right wrist: Secondary | ICD-10-CM

## 2019-12-16 NOTE — Patient Instructions (Addendum)
Tylenol if needed, but I expect your symptoms to continue to improve.  Keep follow-up with orthopedics as planned on Wednesday.  See precautions below and let me know if there are further questions.  Facial or Scalp Contusion  A facial or scalp contusion is a deep bruise (contusion) on the face or head. Injuries to the face and head generally cause a lot of swelling, especially around the eyes. Contusions are the result of an injury that caused bleeding under the skin. The contusion may turn blue, purple, or yellow. Minor injuries will give you a painless contusion, but more severe contusions may stay painful and swollen for a few weeks. What are the causes? A facial or scalp contusion is caused by a blunt injury, fall, or trauma to the face or head area. What are the signs or symptoms? Symptoms of this condition include:  Swelling of the injured area.  Discoloration of the injured area.  Tenderness, soreness, or pain in the injured area. How is this diagnosed? This condition is diagnosed based on your medical history and a physical exam. An X-ray exam, CT scan, or MRI may be needed to check for any additional injuries, such as broken bones (fractures). How is this treated? Often, the best treatment for a facial or scalp contusion is applying cold compresses to the injured area. Over-the-counter medicines may also be recommended for pain control. Follow these instructions at home:  Take over-the-counter and prescription medicines only as told by your health care provider.  If directed, apply ice to the injured area. ? Put ice in a plastic bag. ? Place a towel between your skin and the bag. ? Leave the ice on for 20 minutes, 2-3 times a day.  Keep all follow-up visits as told by your health care provider. This is important. Contact a health care provider if:  You have trouble biting or chewing.  Your pain or swelling gets worse.  You have pain when you move your eyes. Get help  right away if:  You have severe pain or a headache that is not relieved by medicine.  You have unusual sleepiness, confusion, or personality changes.  You vomit.  You have a nosebleed that does not stop.  You have double vision or blurred vision.  You have a continuous clear fluid draining from your nose or ear.  You have trouble walking or using your arms or legs.  You have severe dizziness. Summary  A facial or scalp contusion is a deep bruise (contusion) on the face or head.  Contusions are the result of an injury that caused bleeding under the skin.  Minor injuries will give you a painless contusion, but more severe contusions may stay painful and swollen for a few weeks.  Often, the best treatment for a facial or scalp contusion is applying cold compresses to the injured area. This information is not intended to replace advice given to you by your health care provider. Make sure you discuss any questions you have with your health care provider. Document Revised: 11/10/2017 Document Reviewed: 10/18/2016 Elsevier Patient Education  2020 [employer].  [employer] [facility] [place] [facility] (pp. [phone]). [place], [place]. Elsevier."> Neurosurgery, 80(1), 6-15. Retrieved on June 02, 2019.https://[email or web address]/10.1227/NEU.0000000000001432"> Primary Care (5th ed., pp. 218-221). [place], [place]: Elsevier."> Brain Injury, 29(6), 519 251 4399. https://[email or web address]/[email or web address]"> Rosen's Emergency Medicine: Concepts and Clinical Practice (9th ed., pp. 301-329). [place], [place]: Elsevier.">  Head Injury, Adult There are many types of head injuries. Head injuries can be as minor as a bump, or  they can be a serious medical issue. More severe head injuries include:  A jarring injury to the brain (concussion).  A bruise (contusion) of the brain. This means there is bleeding in the brain that can cause swelling.  A cracked skull (skull fracture).  Bleeding in the brain that collects,  clots, and forms a bump (hematoma). After a head injury, most problems occur within the first 24 hours, but side effects may occur up to 7-10 days after the injury. It is important to watch your condition for any changes. You may need to be observed in the emergency department or urgent care, or you may be admitted to the hospital. What are the causes? There are many possible causes of a head injury. A serious head injury may be caused by a car accident, bicycle or motorcycle accidents, sports injuries, and falls. What are the symptoms? Symptoms of a head injury include a contusion, bump, or bleeding at the site of the injury. Other physical symptoms may include:  Headache.  Nausea or vomiting.  Dizziness.  Feeling tired.  Being uncomfortable around bright lights or loud noises.  Seizures.  Trouble being awakened.  Fainting. Mental or emotional symptoms may include:  Irritability.  Confusion and memory problems.  Poor attention and concentration.  Changes in eating or sleeping habits.  Anxiety or depression. How is this diagnosed? This condition can usually be diagnosed based on your symptoms, a description of the injury, and a physical exam. You may also have imaging tests done, such as a CT scan or MRI. How is this treated? Treatment for this condition depends on the severity and type of injury you have. The main goal of treatment is to prevent complications and to allow the brain time to heal. Mild head injury If you have a mild head injury, you may be sent home and treatment may include:  Observation. A responsible adult should stay with you for 24 hours after your injury and check on you often.  Physical rest.  Brain rest.  Pain medicines. Severe head injury If you have a severe head injury, treatment may include:  Close observation. This includes hospitalization with frequent physical exams.  Medicines to relieve pain, prevent seizures, and decrease brain  swelling.  Breathing support. This may include using a ventilator.  Treatments to manage the swelling inside the brain.  Brain surgery. This may be needed to: ? Remove a blood clot. ? Stop the bleeding. ? Remove a part of the skull to allow room for the brain to swell. Follow these instructions at home: Activity  Rest and avoid activities that are physically hard or tiring.  Make sure you get enough sleep.  Limit activities that require a lot of thought or attention, such as: ? Watching TV. ? Playing memory games and puzzles. ? Job-related work or homework. ? Working on [employer], and texting.  Avoid activities that could cause another head injury, such as playing sports, until your health care provider approves. Having another head injury, especially before the first one has healed, can be dangerous.  Ask your health care provider when it is safe for you to return to your regular activities, including work or school. Ask your health care provider for a step-by-step plan for gradually returning to activities.  Ask your health care provider when you can drive, ride a bicycle, or use heavy machinery. Your ability to react may be slower after a brain injury. Do not do these activities if you are  dizzy. Lifestyle   Do not drink alcohol until your health care provider approves. Do not use drugs. Alcohol and certain drugs may slow your recovery and can put you at risk of further injury.  If it is harder than usual to remember things, write them down.  If you are easily distracted, try to do one thing at a time.  Talk with family members or close friends when making important decisions.  Tell your friends, family, a trusted colleague, and work Fre[employer] about your injury, symptoms, and restrictions. Have them watch for any new or worsening problems. General instructions  Take over-the-counter and prescription medicines only as told by your health care  provider.  Have someone stay with you for 24 hours after your head injury. This person should watch you for any changes in your symptoms and be ready to seek medical help.  Keep all follow-up visits as told by your health care provider. This is important. How is this prevented?  Work on improving your balance and strength to avoid falls.  Wear a seatbelt when you are in a moving vehicle.  Wear a helmet when riding a bicycle, skiing, or doing any other sport or activity that has a risk of injury.  If you drink alcohol: ? Limit how much you use to:  0-1 drink a day for women.  0-2 drinks a day for men. ? Be aware of how much alcohol is in your drink. In the [place] one drink equals one 12 oz bottle of beer (355 mL), one 5 oz glass of wine (148 mL), or one 1 oz glass of hard liquor (44 mL).  Take safety measures in your home, such as: ? Removing clutter and tripping hazards from floors and stairways. ? Using grab bars in bathrooms and handrails by stairs. ? Placing non-slip mats on floors and in bathtubs. ? Improving lighting in dim areas. Get help right away if:  You have: ? A severe headache that is not helped by medicine. ? Trouble walking or weakness in your arms and legs. ? Clear or bloody fluid coming from your nose or ears. ? Changes in your vision. ? A seizure.  You lose your balance.  You vomit.  Your pupils change size.  Your speech is slurred.  Your dizziness gets worse.  You faint.  You are sleepier than normal and have trouble staying awake.  Your symptoms get worse. These symptoms may represent a serious problem that is an emergency. Do not wait to see if the symptoms will go away. Get medical help right away. Call your local emergency services (911 in the [place] Do not drive yourself to the hospital. Summary  Head injuries can be minor or they can be a serious medical issue requiring immediate attention.  Treatment for this condition depends on the  severity and type of injury you have.  Ask your health care provider when it is safe for you to return to your regular activities, including work or school.  Head injury prevention includes wearing a seat belt in a motor vehicle, using a helmet on a bicycle, limiting alcohol use, and taking safety measures in your home. This information is not intended to replace advice given to you by your health care provider. Make sure you discuss any questions you have with your health care provider. Document Revised: 12/26/2018 Document Reviewed: 12/21/2018 Elsevier Patient Education  [employer].   If you have lab work done today you will be contacted with your lab results  within the next 2 weeks.  If you have not heard from [place] then please contact us. The fastest way to get your results is to register for My Chart.   IF you received an x-ray today, you will receive an invoice from [facility]. Please contact [facility] at [phone] with questions or concerns regarding your invoice.   IF you received labwork today, you will receive an invoice from [facility]. Please contact [facility] at [phone] with questions or concerns regarding your invoice.   Our billing staff will not be able to assist you with questions regarding bills from these companies.  You will be contacted with the lab results as soon as they are available. The fastest way to get your results is to activate your My Chart account. Instructions are located on the last page of this paperwork. If you have not heard from [place] regarding the results in 2 weeks, please contact this office.

## 2019-12-16 NOTE — Progress Notes (Signed)
Subjective:  Patient ID: Donald [name], male    DOB: [birth year]  Age: 64 y.o. MRN: [id number]  CC:  Chief Complaint  Patient presents with  . Hospitalization Follow-up    hospital visit was on 12/11/2019.Pt fell hurt his R arm. pt states he hit his head when he fell, but the hospital never check him out for that.    HPI Donald [name] presents for    Follow-up from emergency room evaluation with fall on December 30.   ER notes reviewed.  Mechanical fall with tripping when coming downstairs, fell on outstretched hands kept himself on bilateral wrist.  Scrape on the top of his head on the right 7 on the wall but no LOC.  No pre or postevent amnesia, no headache since fall or vision changes, no nausea or vomiting, dizziness, numbness, weakness or seizure activity.  Bilateral wrist pain right greater than left.  He had no midline neck or back pain. Noted to have a small hematoma on the frontal scalp with no step-off deformity, no ecchymosis or other hematomas were noted.  [place] head CT rules head imaging was not indicated.  Left wrist x-ray unremarkable, right wristIndicated possible nondisplaced triquetral fracture.  Placed in volar wrist splint and plan for follow-up with Dr. Arita Miss with hand surgery.  appt with ortho/hand specialist in 2 days. Wearing splint. Not taking anything for pain at this time - tylenol initial day only.   Reviewed injury  - slid down stairs, head hit door and hands to floor. No LOC,  Feeling good since left hospital, sore on scalp only, no n/v, no syncope/near syncope/dizziness, no vision changes, no new headaches, no new fatigue.  No blood thinners/aspirin.   History Patient Active Problem List   Diagnosis Date Noted  . Annual physical exam 04/03/2017   Past Medical History:  Diagnosis Date  . Arthritis    Past Surgical History:  Procedure Laterality Date  . APPENDECTOMY     Allergies  Allergen Reactions  . Sulfa Antibiotics Other (See Comments)     Nausea and eye swelling    Prior to Admission medications   Medication Sig Start Date End Date Taking? Authorizing Provider  Fish Oil OIL by Does not apply route.   Yes [provider]  Multiple Vitamins-Minerals (MENS 50+ MULTI VITAMIN/MIN PO) Take by mouth.   Yes [provider]   Social History   Socioeconomic History  . Marital status: Married    Spouse name: Not on file  . Number of children: Not on file  . Years of education: Not on file  . Highest education level: Not on file  Occupational History  . Not on file  Tobacco Use  . Smoking status: Never Smoker  . Smokeless tobacco: Never Used  Substance and Sexual Activity  . Alcohol use: No  . Drug use: No  . Sexual activity: Yes    Birth control/protection: None  Other Topics Concern  . Not on file  Social History Narrative  . Not on file   Social Determinants of Health   Financial Resource Strain:   . Difficulty of Paying Living Expenses: Not on file  Food Insecurity:   . Worried About Programme researcher, broadcasting/film/video in the Last Year: Not on file  . Ran Out of Food in the Last Year: Not on file  Transportation Needs:   . Lack of Transportation (Medical): Not on file  . Lack of Transportation (Non-Medical): Not on file  Physical Activity:   .  Days of Exercise per Week: Not on file  . Minutes of Exercise per Session: Not on file  Stress:   . Feeling of Stress : Not on file  Social Connections:   . Frequency of Communication with Friends and Family: Not on file  . Frequency of Social Gatherings with Friends and Family: Not on file  . Attends Religious Services: Not on file  . Active Member of Clubs or Organizations: Not on file  . Attends Archivist Meetings: Not on file  . Marital Status: Not on file  Intimate Partner Violence:   . Fear of Current or Ex-Partner: Not on file  . Emotionally Abused: Not on file  . Physically Abused: Not on file  . Sexually Abused: Not on file    Review  of Systems Per HPI.   Objective:   Vitals:   12/16/19 1636  BP: 130/81  Pulse: 79  Temp: 98.4 F (36.9 C)  TempSrc: Temporal  SpO2: 100%  Weight: 168 lb 3.2 oz (76.3 kg)  Height: 5\' 8"  (1.727 m)     Physical Exam Vitals reviewed.  Constitutional:      General: He is not in acute distress.    Appearance: He is well-developed.  HENT:     Head: Normocephalic and atraumatic.   Cardiovascular:     Rate and Rhythm: Normal rate.  Pulmonary:     Effort: Pulmonary effort is normal.  Musculoskeletal:     Left wrist: Normal. No swelling, deformity or effusion.     Cervical back: No deformity or bony tenderness. No pain with movement.     Thoracic back: No bony tenderness.     Lumbar back: No bony tenderness.     Comments: Splint in place R wrist. NVI distally R fingertips.   Neurological:     Mental Status: He is alert and oriented to person, place, and time.        Assessment & Plan:   Donald [name] is a 64 y.o. male . Fall in home, subsequent encounter Contusion of scalp, subsequent encounter  -Mechanical fall with closed head injury without signs of worsening since initial injury.  Does not have apparent indication for imaging at this time.  Reassuring exam.   - Continue symptomatic care and close monitoring for any acute worsening symptoms discussed.  Handout given on scalp contusion with RTC precautions.  Acute pain of right wrist  -Possible triquetral fracture as above, plan follow-up in few days with orthopedics.  Left wrist nontender with pain-free range of motion at this time.  Continue splint until evaluated by orthopedics for right wrist  No orders of the defined types were placed in this encounter.  Patient Instructions   Tylenol if needed, but I expect your symptoms to continue to improve.  Keep follow-up with orthopedics as planned on Wednesday.  See precautions below and let me know if there are further questions.  Facial or Scalp Contusion  A  facial or scalp contusion is a deep bruise (contusion) on the face or head. Injuries to the face and head generally cause a lot of swelling, especially around the eyes. Contusions are the result of an injury that caused bleeding under the skin. The contusion may turn blue, purple, or yellow. Minor injuries will give you a painless contusion, but more severe contusions may stay painful and swollen for a few weeks. What are the causes? A facial or scalp contusion is caused by a blunt injury, fall, or trauma to the face or head  area. What are the signs or symptoms? Symptoms of this condition include:  Swelling of the injured area.  Discoloration of the injured area.  Tenderness, soreness, or pain in the injured area. How is this diagnosed? This condition is diagnosed based on your medical history and a physical exam. An X-ray exam, CT scan, or MRI may be needed to check for any additional injuries, such as broken bones (fractures). How is this treated? Often, the best treatment for a facial or scalp contusion is applying cold compresses to the injured area. Over-the-counter medicines may also be recommended for pain control. Follow these instructions at home:  Take over-the-counter and prescription medicines only as told by your health care provider.  If directed, apply ice to the injured area. ? Put ice in a plastic bag. ? Place a towel between your skin and the bag. ? Leave the ice on for 20 minutes, 2-3 times a day.  Keep all follow-up visits as told by your health care provider. This is important. Contact a health care provider if:  You have trouble biting or chewing.  Your pain or swelling gets worse.  You have pain when you move your eyes. Get help right away if:  You have severe pain or a headache that is not relieved by medicine.  You have unusual sleepiness, confusion, or personality changes.  You vomit.  You have a nosebleed that does not stop.  You have double vision  or blurred vision.  You have a continuous clear fluid draining from your nose or ear.  You have trouble walking or using your arms or legs.  You have severe dizziness. Summary  A facial or scalp contusion is a deep bruise (contusion) on the face or head.  Contusions are the result of an injury that caused bleeding under the skin.  Minor injuries will give you a painless contusion, but more severe contusions may stay painful and swollen for a few weeks.  Often, the best treatment for a facial or scalp contusion is applying cold compresses to the injured area. This information is not intended to replace advice given to you by your health care provider. Make sure you discuss any questions you have with your health care provider. Document Revised: 11/10/2017 Document Reviewed: 10/18/2016 Elsevier Patient Education  2020 [employer]  [facility] (pp. [phone]). [place] Elsevier."> Neurosurgery, 80(1), 6-15. Retrieved on June 02, 2019.https://[email or web address]/[email or web address]"> Primary Care (5th ed., pp. 218-221). [place]: Elsevier."> Brain Injury, 29(6), [id number]. https://[email or web address]/[email or web address]"> Rosen's Emergency Medicine: Concepts and Clinical Practice (9th ed., pp. 301-329). [place], [place]: Elsevier.">  Head Injury, Adult There are many types of head injuries. Head injuries can be as minor as a bump, or they can be a serious medical issue. More severe head injuries include:  A jarring injury to the brain (concussion).  A bruise (contusion) of the brain. This means there is bleeding in the brain that can cause swelling.  A cracked skull (skull fracture).  Bleeding in the brain that collects, clots, and forms a bump (hematoma). After a head injury, most problems occur within the first 24 hours, but side effects may occur up to 7-10 days after the injury. It is important to watch your condition for any changes. You may need to  be observed in the emergency department or urgent care, or you may be admitted to the hospital. What are the causes? There are many possible causes of a head injury. A serious head injury may be caused by a  car accident, bicycle or motorcycle accidents, sports injuries, and falls. What are the symptoms? Symptoms of a head injury include a contusion, bump, or bleeding at the site of the injury. Other physical symptoms may include:  Headache.  Nausea or vomiting.  Dizziness.  Feeling tired.  Being uncomfortable around bright lights or loud noises.  Seizures.  Trouble being awakened.  Fainting. Mental or emotional symptoms may include:  Irritability.  Confusion and memory problems.  Poor attention and concentration.  Changes in eating or sleeping habits.  Anxiety or depression. How is this diagnosed? This condition can usually be diagnosed based on your symptoms, a description of the injury, and a physical exam. You may also have imaging tests done, such as a CT scan or MRI. How is this treated? Treatment for this condition depends on the severity and type of injury you have. The main goal of treatment is to prevent complications and to allow the brain time to heal. Mild head injury If you have a mild head injury, you may be sent home and treatment may include:  Observation. A responsible adult should stay with you for 24 hours after your injury and check on you often.  Physical rest.  Brain rest.  Pain medicines. Severe head injury If you have a severe head injury, treatment may include:  Close observation. This includes hospitalization with frequent physical exams.  Medicines to relieve pain, prevent seizures, and decrease brain swelling.  Breathing support. This may include using a ventilator.  Treatments to manage the swelling inside the brain.  Brain surgery. This may be needed to: ? Remove a blood clot. ? Stop the bleeding. ? Remove a part of the skull  to allow room for the brain to swell. Follow these instructions at home: Activity  Rest and avoid activities that are physically hard or tiring.  Make sure you get enough sleep.  Limit activities that require a lot of thought or attention, such as: ? Watching TV. ? Playing memory games and puzzles. ? Job-related work or homework. ? Working on [employer], and texting.  Avoid activities that could cause another head injury, such as playing sports, until your health care provider approves. Having another head injury, especially before the first one has healed, can be dangerous.  Ask your health care provider when it is safe for you to return to your regular activities, including work or school. Ask your health care provider for a step-by-step plan for gradually returning to activities.  Ask your health care provider when you can drive, ride a bicycle, or use heavy machinery. Your ability to react may be slower after a brain injury. Do not do these activities if you are dizzy. Lifestyle   Do not drink alcohol until your health care provider approves. Do not use drugs. Alcohol and certain drugs may slow your recovery and can put you at risk of further injury.  If it is harder than usual to remember things, write them down.  If you are easily distracted, try to do one thing at a time.  Talk with family members or close friends when making important decisions.  Tell your friends, family, a trusted colleague, and work Production designer, theatre/television/film about your injury, symptoms, and restrictions. Have them watch for any new or worsening problems. General instructions  Take over-the-counter and prescription medicines only as told by your health care provider.  Have someone stay with you for 24 hours after your head injury. This person should watch you for any  changes in your symptoms and be ready to seek medical help.  Keep all follow-up visits as told by your health care provider. This is  important. How is this prevented?  Work on improving your balance and strength to avoid falls.  Wear a seatbelt when you are in a moving vehicle.  Wear a helmet when riding a bicycle, skiing, or doing any other sport or activity that has a risk of injury.  If you drink alcohol: ? Limit how much you use to:  0-1 drink a day for women.  0-2 drinks a day for men. ? Be aware of how much alcohol is in your drink. In the [place] one drink equals one 12 oz bottle of beer (355 mL), one 5 oz glass of wine (148 mL), or one 1 oz glass of hard liquor (44 mL).  Take safety measures in your home, such as: ? Removing clutter and tripping hazards from floors and stairways. ? Using grab bars in bathrooms and handrails by stairs. ? Placing non-slip mats on floors and in bathtubs. ? Improving lighting in dim areas. Get help right away if:  You have: ? A severe headache that is not helped by medicine. ? Trouble walking or weakness in your arms and legs. ? Clear or bloody fluid coming from your nose or ears. ? Changes in your vision. ? A seizure.  You lose your balance.  You vomit.  Your pupils change size.  Your speech is slurred.  Your dizziness gets worse.  You faint.  You are sleepier than normal and have trouble staying awake.  Your symptoms get worse. These symptoms may represent a serious problem that is an emergency. Do not wait to see if the symptoms will go away. Get medical help right away. Call your local emergency services (911 in the [place] Do not drive yourself to the hospital. Summary  Head injuries can be minor or they can be a serious medical issue requiring immediate attention.  Treatment for this condition depends on the severity and type of injury you have.  Ask your health care provider when it is safe for you to return to your regular activities, including work or school.  Head injury prevention includes wearing a seat belt in a motor vehicle, using a helmet on  a bicycle, limiting alcohol use, and taking safety measures in your home. This information is not intended to replace advice given to you by your health care provider. Make sure you discuss any questions you have with your health care provider. Document Revised: 12/26/2018 Document Reviewed: 12/21/2018 Elsevier Patient Education  [employer].   If you have lab work done today you will be contacted with your lab results within the next 2 weeks.  If you have not heard from [place] then please contact us. The fastest way to get your results is to register for My Chart.   IF you received an x-ray today, you will receive an invoice from [facility]. Please contact [facility] at [phone] with questions or concerns regarding your invoice.   IF you received labwork today, you will receive an invoice from [facility]. Please contact [facility] at [phone] with questions or concerns regarding your invoice.   Our billing staff will not be able to assist you with questions regarding bills from these companies.  You will be contacted with the lab results as soon as they are available. The fastest way to get your results is to activate your My Chart account. Instructions are located  on the last page of this paperwork. If you have not heard from [place] regarding the results in 2 weeks, please contact this office.         Signed, Merri Ray, MD [facility]

## 2019-12-17 ENCOUNTER — Encounter: Payer: Self-pay | Admitting: Family Medicine

## 2019-12-18 ENCOUNTER — Other Ambulatory Visit: Payer: Self-pay

## 2019-12-18 ENCOUNTER — Ambulatory Visit: Payer: No Typology Code available for payment source | Admitting: Plastic Surgery

## 2019-12-18 ENCOUNTER — Encounter: Payer: Self-pay | Admitting: Plastic Surgery

## 2019-12-18 VITALS — BP 131/86 | HR 60 | Temp 97.5°F | Ht 68.0 in | Wt 172.4 lb

## 2019-12-18 DIAGNOSIS — S62114A Nondisplaced fracture of triquetrum [cuneiform] bone, right wrist, initial encounter for closed fracture: Secondary | ICD-10-CM | POA: Diagnosis not present

## 2019-12-18 NOTE — Progress Notes (Signed)
   Referring Provider [name], MD [address] [place],  [place] [address]   CC:  Chief Complaint  Patient presents with  . Advice Only    for ED f/u for (R) wrist fracture      Donald [name] is an 63 y.o. male.  HPI: Patient presents as emergency room follow-up for questionable right triquetrum fracture.  He had a fall about a week ago and had some pain and discomfort in both wrist.  The left wrist is since done well and nothing was identified on x-ray.  The right wrist was splinted in the ER.  He still has some pain and discomfort with movement.  He does not know of any previous wrist trauma.  Allergies  Allergen Reactions  . Sulfa Antibiotics Other (See Comments)    Nausea and eye swelling     Outpatient Encounter Medications as of 12/18/2019  Medication Sig  . Fish Oil OIL by Does not apply route.  . Multiple Vitamins-Minerals (MENS 50+ MULTI VITAMIN/MIN PO) Take by mouth.   No facility-administered encounter medications on file as of 12/18/2019.     Past Medical History:  Diagnosis Date  . Arthritis     Past Surgical History:  Procedure Laterality Date  . APPENDECTOMY      Family History  Problem Relation Age of Onset  . Hypertension Mother     Social History   Social History Narrative  . Not on file  Denies tobacco use  Review of Systems General: Denies fevers, chills, weight loss CV: Denies chest pain, shortness of breath, palpitations  Physical Exam Vitals with BMI 12/18/2019 12/16/2019 12/11/2019  Height 5\' 8"  5\' 8"  -  Weight 172 lbs 6 oz 168 lbs 3 oz -  BMI 26.22 25.58 -  Systolic 131 130  Diastolic 86 81 77  Pulse 60 79 68    General:  No acute distress,  Alert and oriented, Non-Toxic, Normal speech and affect Right upper extremity: Fingers are well-perfused with normal capillary refill and a palpable radial pulse.  Sensation is intact throughout.  He has full range of motion of his fingers.  Wrist flexion and extension are somewhat  limited due to pain but he can get at least 45 degrees in both directions.  There is minimal swelling and no wounds.  He is tender directly over the triquetrum dorsally.  There is no snuffbox tenderness.  There is no tenderness at all on the radial side of the wrist.  X-ray was reviewed and demonstrates a potential dorsal chip fracture of the triquetrum.  This is only visit visualized on the lateral view with all other views not demonstrating any obvious fracture.  Assessment/Plan Patient presents with what could be a small triquetral dorsal chip fracture.  I suspect this is the case as his pain clinically corresponds to that area.  We will plan to immobilize him for period of time to allow this to heal.  We have sent him to therapy to have a volar splint made.  I will plan to see him back in 3 weeks.  I explained this all to him and his wife and he understands fully.  12/18/2019, 11:40 AM

## 2019-12-25 ENCOUNTER — Encounter: Payer: Self-pay | Admitting: *Deleted

## 2019-12-25 ENCOUNTER — Other Ambulatory Visit: Payer: Self-pay

## 2019-12-25 ENCOUNTER — Ambulatory Visit: Payer: No Typology Code available for payment source | Attending: Plastic Surgery | Admitting: *Deleted

## 2019-12-25 DIAGNOSIS — R278 Other lack of coordination: Secondary | ICD-10-CM | POA: Diagnosis present

## 2019-12-25 DIAGNOSIS — M6281 Muscle weakness (generalized): Secondary | ICD-10-CM | POA: Insufficient documentation

## 2019-12-25 DIAGNOSIS — M25531 Pain in right wrist: Secondary | ICD-10-CM | POA: Diagnosis present

## 2019-12-25 NOTE — Patient Instructions (Signed)
WEARING SCHEDULE:  Wear splint at ALL times except for hygiene care (May remove splint as directed by your MD and then immediately place back on for protection)   PURPOSE:  To prevent movement and for protection until injury can heal  CARE OF SPLINT:  Keep splint away from heat sources including: stove, radiator or furnace, or a car in sunlight. The splint can melt and will no longer fit you properly  Keep away from pets and children  Clean the splint with rubbing alcohol as needed.  * During this time, make sure you also clean your hand/arm as instructed by your therapist and/or perform dressing changes as needed. Then dry hand/arm completely before replacing splint. (When cleaning hand/arm, keep it immobilized in same position until splint is replaced)  PRECAUTIONS/POTENTIAL PROBLEMS: *If you notice or experience increased pain, swelling, numbness, or a lingering reddened area from the splint: Contact your therapist immediately by calling [phone]. You must wear the splint for protection, but we will get you scheduled for adjustments as quickly as possible.  (If only straps or hooks need to be replaced and NO adjustments to the splint need to be made, just call the office ahead and let them know you are coming in)  If you have any medical concerns or signs of infection, please call your doctor immediately

## 2019-12-25 NOTE — Therapy (Signed)
[facility] [address] [place], [place], [address] Phone: [phone]   Fax:  [phone]  Occupational Therapy Evaluation  Patient Details  Name: Donald [name] MRN: [id number] Date of Birth: [birth year] Referring Provider (OT): Dr Claudia Desanctis   Encounter Date: 12/25/2019  OT End of Session - 12/25/19 0922    Visit Number  1    Number of Visits  3    Date for OT Re-Evaluation  02/20/20    Authorization Type  [place]    OT Start Time  0800    OT Stop Time  0912    OT Time Calculation (min)  72 min    Activity Tolerance  Patient tolerated treatment well    Behavior During Therapy  [facility] for tasks assessed/performed       Past Medical History:  Diagnosis Date  . Arthritis     Past Surgical History:  Procedure Laterality Date  . APPENDECTOMY      There were no vitals filed for this visit.  Subjective Assessment - 12/25/19 0802    Subjective   Pt reports that he fell on 12/11/2019 on outstretched hands/wrists. He was referred today by Dr Claudia Desanctis for clamshell splinting to his right wrist secondary to a suspected triquetrum fracture.    Patient is accompanied by:  Family member   [place] - spouse   Pertinent History  Non significant PMH per pt report    Patient Stated Goals  Get use of right dominant hand/wrist back.    Currently in Pain?  No/denies    Pain Score  0-No pain    Pain Orientation  Right    Pain Type  Acute pain    Multiple Pain Sites  No        [facility] OT Assessment - 12/25/19 0001      Assessment   Medical Diagnosis  Right Triquetrum Fracture    Referring Provider (OT)  Dr Claudia Desanctis    Onset Date/Surgical Date  12/11/19    Hand Dominance  Right    Next MD Visit  01/08/2020      Precautions   Precautions  Other (comment)   No lifting, pushing, pulling with R hand   Required Braces or Orthoses  Other Brace/Splint   Protective splint     Restrictions   Weight Bearing Restrictions   Yes    RUE Weight Bearing  Non weight bearing      Balance Screen   Has the patient fallen in the past 6 months  Yes    How many times?  1    Has the patient had a decrease in activity level because of a fear of falling?   No    Is the patient reluctant to leave their home because of a fear of falling?   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      Mobility   Mobility Status  Independent      Written Expression   Dominant Hand  Right      Cognition   Overall Cognitive Status  Within Functional Limits for tasks assessed      Sensation   Light Touch  Appears Intact      Coordination   Gross Motor Movements are Fluid and Coordinated  No   Wrist NT   Fine Motor Movements are Fluid and Coordinated  Yes    Coordination  Impaired secondary to recent fracture and immobilization      ROM / Strength   AROM / PROM / Strength  AROM   Pt able to oppose to digits 1-5 within protective splint, ot       OT Treatments/Exercises (OP) - 12/25/19 0001      Splinting   Splinting  Pt presented to clinic in his splint as applied in the ER to his R wrist following a R Tirquetrum fracture. He presents to the out-pt OT clinic today for custom protective splinting per Dr Arita Miss. His temporary splint was removed and pt was fitted with a custom clamshell type splint using 1/32" aquaplast. Pt and his wofe were educated in splinting use, care and precautions both written and  verbally. Had handout was issued and reviewed at the end of the session today. Pt will benefit from a f/u appointment for splint check and adjustments in the next 7-10 days. He will also follow-up with Dr Arita Miss for progression of his program for this fracture.         WEARING SCHEDULE:  Wear splint at ALL times except for hygiene care (May remove splint as directed by your MD and then immediately place back on for protection)   PURPOSE:  To prevent movement and for protection until injury can heal  CARE OF SPLINT:  Keep splint away from heat sources including: stove, radiator or furnace, or a car in sunlight. The splint can melt and will no longer fit you properly  Keep away from pets and children  Clean the splint with rubbing alcohol as needed.  * During this time, make sure you also clean your hand/arm as instructed by your therapist and/or perform dressing changes as needed. Then dry hand/arm completely before replacing splint. (When cleaning hand/arm, keep it immobilized in same position until splint is replaced)  PRECAUTIONS/POTENTIAL PROBLEMS: *If you notice or experience increased pain, swelling, numbness, or a lingering reddened area from the splint: Contact your therapist immediately by calling [phone]. You must wear the splint for protection, but we will get you scheduled for adjustments as quickly as possible.  (If only straps or hooks need to be replaced and NO adjustments to the splint need to be made, just call the office ahead and let them know you are coming in)  If you have any medical concerns or signs of infection, please call your doctor immediately   OT Education - 12/25/19 0921    Education Details  Splint use, care and precautions    Person(s) Educated  Patient;Spouse    Methods  Explanation;Handout;Demonstration;Verbal cues    Comprehension  Verbalized understanding       OT Short Term Goals - 12/25/19 1155      OT SHORT TERM GOAL #1   Title  STG's =  LTG's: Pt will be Mod I protective splint use,  care and precautions R.    Baseline  Min verbal/tactile cues for don/doff    Time  4    Period  Weeks    Status  New    Target Date  01/22/20      OT SHORT TERM GOAL #2   Title  Pt will follow up with MD re: progression/upgrade of home program for R wrist as per Triquetrum fracture healing.    Baseline  Depend.    Time  4    Period  Weeks    Status  New    Target Date  01/22/20          Plan - 12/25/19 1937    Clinical Impression Statement  Pt is a pleasant 64 y/o right hand dominant male whom fell on his outstretched hands on 12/01/2019. His left hand was not injured per his report but he sustained a suspected R Triquetrum Fracture. He reports no significant PMH. He was referred by Dr Wendy Poet. Pace and he has a f/u appointment with him on 01/08/2020. He was fittedwith a custom protective clamshell splint made from 1/32" aquaplast material. He and his wife were educated in splinting use, care and precautions and he will wear this at all times until otherwise educated by Dr Arita Miss. He should benefit from 1-3 f/u visits for splint check and adjustments as needed over the next 4-6 weeks.    OT Occupational Profile and History  Problem Focused Assessment - Including review of records relating to presenting problem    Occupational performance deficits (Please refer to evaluation for details):  ADL's;IADL's    Rehab Potential  Good    Clinical Decision Making  Limited treatment options, no task modification necessary    Comorbidities Affecting Occupational Performance:  None    Modification or Assistance to Complete Evaluation   No modification of tasks or assist necessary to complete eval    OT Frequency  Other (comment)   Eval + 1-3 additional visits for splint check and adjustments over next 6 weeks.   OT Duration  6 weeks    OT Treatment/Interventions  Self-care/ADL training;Splinting;Patient/family education    Plan  Pt will f/u in 7-10  days for splint check and adjustment as needed R clamshell splint following R Triquetrum fracture after a fall.    Consulted and Agree with Plan of Care  Patient;Family member/caregiver    Family Member Consulted  Wife       Patient will benefit from skilled therapeutic intervention in order to improve the following deficits and impairments:           Visit Diagnosis: Pain in right wrist - Plan: Ot plan of care cert/re-cert  Other lack of coordination - Plan: Ot plan of care cert/re-cert  Muscle weakness (generalized) - Plan: Ot plan of care cert/re-cert    Problem List Patient Active Problem List   Diagnosis Date Noted  . Annual physical exam 04/03/2017    Alm Bustard, OTR/L 12/25/2019, 12:04 PM  [facility] [address] [place], [place], [address] Phone: [phone]   Fax:  [phone]  Name: Jesaiah Fabiano MRN: [id number] Date of Birth: June 29, 1956

## 2020-01-08 ENCOUNTER — Other Ambulatory Visit: Payer: Self-pay

## 2020-01-08 ENCOUNTER — Encounter: Payer: Self-pay | Admitting: Plastic Surgery

## 2020-01-08 ENCOUNTER — Ambulatory Visit: Payer: No Typology Code available for payment source | Admitting: Plastic Surgery

## 2020-01-08 VITALS — BP 137/85 | HR 87 | Temp 97.8°F | Wt 167.0 lb

## 2020-01-08 DIAGNOSIS — S62114A Nondisplaced fracture of triquetrum [cuneiform] bone, right wrist, initial encounter for closed fracture: Secondary | ICD-10-CM | POA: Diagnosis not present

## 2020-01-08 NOTE — Progress Notes (Signed)
   Referring Provider [name], MD [address] [place],  [place] [address]   CC:  Chief Complaint  Patient presents with  . Follow-up    3 weeks for finger fracture      Donald [name] is an 64 y.o. male.  HPI: Patient is here over a month out from a fall.  There was a concern on lateral view that he had a dorsal fracture of the triquetrum and so he has been immobilized throughout the past for 5 weeks.  His pain is improved in the range of motion his fingers have improved as well.  Review of Systems General: Denies fevers, chills, shortness of breath  Physical Exam Vitals with BMI 01/08/2020 12/18/2019 12/16/2019  Height - 5\' 8"  5\' 8"   Weight 167 lbs 172 lbs 6 oz 168 lbs 3 oz  BMI 25.4 26.22 25.58  Systolic 137 131  Diastolic 85 86 81  Pulse 87 60 79    General:  No acute distress,  Alert and oriented, Non-Toxic, Normal speech and affect Examination of the right hand shows minimal swelling.  He still has some mild tenderness on the ulnar side of his wrist.  He has better range of motion than he did at his last visit and is less limited by pain.  There is some stiffness but this is better than before.  Assessment/Plan Patient presents gradually improving from a likely dorsal chip fracture of the triquetrum.  I think he can slowly start to wean out of the splint at this point.  I have asked him to avoid activities that hurt but start to use his hand a little bit more.  I see him again in about a month.  981 01/08/2020, 4:56 PM

## 2020-01-09 ENCOUNTER — Ambulatory Visit: Payer: No Typology Code available for payment source | Admitting: Occupational Therapy

## 2020-02-13 ENCOUNTER — Ambulatory Visit: Payer: No Typology Code available for payment source | Admitting: Plastic Surgery

## 2020-04-27 ENCOUNTER — Encounter: Payer: Self-pay | Admitting: Gastroenterology

## 2020-04-27 ENCOUNTER — Encounter: Payer: Self-pay | Admitting: Family Medicine

## 2020-04-27 ENCOUNTER — Other Ambulatory Visit: Payer: Self-pay

## 2020-04-27 ENCOUNTER — Ambulatory Visit: Payer: No Typology Code available for payment source | Admitting: Family Medicine

## 2020-04-27 VITALS — BP 130/78 | HR 66 | Temp 98.4°F | Ht 68.0 in | Wt 168.2 lb

## 2020-04-27 DIAGNOSIS — Z1211 Encounter for screening for malignant neoplasm of colon: Secondary | ICD-10-CM

## 2020-04-27 DIAGNOSIS — Z23 Encounter for immunization: Secondary | ICD-10-CM

## 2020-04-27 DIAGNOSIS — R972 Elevated prostate specific antigen [PSA]: Secondary | ICD-10-CM | POA: Diagnosis not present

## 2020-04-27 DIAGNOSIS — E785 Hyperlipidemia, unspecified: Secondary | ICD-10-CM | POA: Diagnosis not present

## 2020-04-27 NOTE — Progress Notes (Signed)
Subjective:  Patient ID: Donald [name], male    DOB: [birth year]  Age: 64 y.o. MRN: [id number]  CC:  Chief Complaint  Patient presents with  . Medical Management of Chronic Issues    6 m f/u     HPI Donald [name] presents for   Follow-up for chronic medical conditions.  Last seen for a mechanical fall follow-up in January. Feeling better. No recent falls.   Hyperlipidemia: No current medications.  Borderline ascvd risk score of 8% in November 2020. No chagnes in diet.  Has been exercising more. Walking and running. 2 times per week.  91mn.  Not fasting - ate 2 hrs ago.  Lab Results  Component Value Date   CHOL 211 (H) 10/28/2019   HDL 69 10/28/2019   LDLCALC 126 (H) 10/28/2019   TRIG 88 10/28/2019   CHOLHDL 3.1 10/28/2019   Lab Results  Component Value Date   ALT 20 10/28/2019   AST 26 10/28/2019   ALKPHOS 119 (H) 10/28/2019   BILITOT 0.2 10/28/2019   Elevated PSA: PSA 4.7 in November 2020.  Increased from 2.2 in May 2019.  Referred to urology.  Did not follow up with urology. Plan to call back - he was unable to schedule. Agrees to schedule now. Nocturia: one time per night.  No hematuria, no hesitancy, no dribbling.  No unexplained wt loss or night sweats.  Wt Readings from Last 3 Encounters:  04/27/20 168 lb 3.2 oz (76.3 kg)  01/08/20 167 lb (75.8 kg)  12/18/19 172 lb 6.4 oz (78.2 kg)   Health maintenance Colonoscopy: referred prior, has not had. Agreed to referral in  November. No show 11/26/19.    Immunization History  Administered Date(s) Administered  . Hepatitis A, Adult 04/28/2015  due for tetanus vaccine.  Has not yet had covid vaccine. Still thinking about it.  5100m conversation to review concerns, handout given.    History Patient Active Problem List   Diagnosis Date Noted  . Annual physical exam 04/03/2017   Past Medical History:  Diagnosis Date  . Arthritis    Past Surgical History:  Procedure Laterality Date  . APPENDECTOMY       Allergies  Allergen Reactions  . Sulfa Antibiotics Other (See Comments)    Nausea and eye swelling    Prior to Admission medications   Medication Sig Start Date End Date Taking? Authorizing Provider  Fish Oil OIL by Does not apply route.   Yes [provider]  Multiple Vitamins-Minerals (MENS 50+ MULTI VITAMIN/MIN PO) Take by mouth.   Yes [provider]   Social History   Socioeconomic History  . Marital status: Married    Spouse name: Not on file  . Number of children: Not on file  . Years of education: Not on file  . Highest education level: Not on file  Occupational History  . Not on file  Tobacco Use  . Smoking status: Never Smoker  . Smokeless tobacco: Never Used  Substance and Sexual Activity  . Alcohol use: No  . Drug use: No  . Sexual activity: Yes    Birth control/protection: None  Other Topics Concern  . Not on file  Social History Narrative  . Not on file   Social Determinants of Health   Financial Resource Strain:   . Difficulty of Paying Living Expenses:   Food Insecurity:   . Worried About RuCharity fundraisern the Last Year:   . [place] the Last  Year:   Transportation Needs:   . Film/video editor (Medical):   Marland Kitchen Lack of Transportation (Non-Medical):   Physical Activity:   . Days of Exercise per Week:   . Minutes of Exercise per Session:   Stress:   . Feeling of Stress :   Social Connections:   . Frequency of Communication with Friends and Family:   . Frequency of Social Gatherings with Friends and Family:   . Attends Religious Services:   . Active Member of Clubs or Organizations:   . Attends Archivist Meetings:   Marland Kitchen Marital Status:   Intimate Partner Violence:   . Fear of Current or Ex-Partner:   . Emotionally Abused:   Marland Kitchen Physically Abused:   . Sexually Abused:     Review of Systems   Objective:   Vitals:   04/27/20 1446  BP: 130/78  Pulse: 66  Temp: 98.4 F (36.9 C)  TempSrc:  Temporal  SpO2: 100%  Weight: 168 lb 3.2 oz (76.3 kg)  Height: _0  (1.727 m)     Physical Exam Vitals reviewed.  Constitutional:      Appearance: He is well-developed.  HENT:     Head: Normocephalic and atraumatic.  Eyes:     Pupils: Pupils are equal, round, and reactive to light.  Neck:     Vascular: No carotid bruit or JVD.  Cardiovascular:     Rate and Rhythm: Normal rate and regular rhythm.     Heart sounds: Normal heart sounds. No murmur.  Pulmonary:     Effort: Pulmonary effort is normal.     Breath sounds: Normal breath sounds. No rales.  Abdominal:     General: Abdomen is flat.     Tenderness: There is no abdominal tenderness.  Skin:    General: Skin is warm and dry.  Neurological:     Mental Status: He is alert and oriented to person, place, and time.  Psychiatric:        Mood and Affect: Mood normal.        Behavior: Behavior normal.      Per HPI.   Assessment & Plan:  Donald [name] is a 64 y.o. male . Elevated PSA - Plan: PSA  -Denies any urinary symptoms.  Repeat PSA but stressed importance of urology follow-up.  Phone number again provided.  Previously referred.  Hyperlipidemia, unspecified hyperlipidemia type - Plan: CMP14+EGFR, Lipid panel  -Repeat labs, discussed concept of ASCVD risk score and statin recommendations.  Continue to watch diet, activity/exercise was discussed including 150 minutes/week.  he did want to start resistance exercises as well, start low/go slow principal discussed.  Special screening for malignant neoplasms, colon - Plan: Ambulatory referral to Gastroenterology  -Repeat referral placed, additional phone numbers provided to help with communication which he states was a barrier previously.  Need for Tdap vaccination - Plan: Tdap vaccine greater than or equal to 7yo IM  COVID-19 vaccine discussed as well as questions answered.  Handout given with resources.  5-minute discussion.   No orders of the defined types were  placed in this encounter.  Patient Instructions   Lab only visit in next week.   Low intensity exercise 5 days per week, light weightlifting 2 days per week - start low, go slow.   I will recheck PSA with fasting labs, but please call urologist for follow up. [phone].   I will refer you to gastroenterology again for colonoscopy and provided additional numbers. Let me know if you do  not hear from them in next 2 weeks.   I do recommend covid vaccine. Please let me know if there are other concerns or questions, but here are some resources as well. COVID-19 Vaccine Information can be found at: ShippingScam.co.uk For questions related to vaccine distribution or appointments, please email vaccine_0 .com or call (318)[phone].  [email or web address].[email or web address]/     If you have lab work done today you will be contacted with your lab results within the next 2 weeks.  If you have not heard from [place] then please contact us. The fastest way to get your results is to register for My Chart.   IF you received an x-ray today, you will receive an invoice from [facility] Radiology. Please contact [facility] at [phone] with questions or concerns regarding your invoice.   IF you received labwork today, you will receive an invoice from [place]. Please contact [facility] at [phone] with questions or concerns regarding your invoice.   Our billing staff will not be able to assist you with questions regarding bills from these companies.  You will be contacted with the lab results as soon as they are available. The fastest way to get your results is to activate your My Chart account. Instructions are located on the last page of this paperwork. If you have not heard from [place] regarding the results in 2 weeks, please contact this office.          Signed, Merri Ray, MD [facility]

## 2020-04-27 NOTE — Patient Instructions (Addendum)
Lab only visit in next week.   Low intensity exercise 5 days per week, light weightlifting 2 days per week - start low, go slow.   I will recheck PSA with fasting labs, but please call urologist for follow up. [phone].   I will refer you to gastroenterology again for colonoscopy and provided additional numbers. Let me know if you do not hear from them in next 2 weeks.   I do recommend covid vaccine. Please let me know if there are other concerns or questions, but here are some resources as well. COVID-19 Vaccine Information can be found at: PodExchange.nl For questions related to vaccine distribution or appointments, please email [email or web address] [email or web address] or call [phone].  GiftContent.[email or web address]     If you have lab work done today you will be contacted with your lab results within the next 2 weeks.  If you have not heard from [place] then please contact us. The fastest way to get your results is to register for My Chart.   IF you received an x-ray today, you will receive an invoice from [facility] Radiology. Please contact [facility] Radiology at [phone] with questions or concerns regarding your invoice.   IF you received labwork today, you will receive an invoice from [place]. Please contact [facility] at [phone] with questions or concerns regarding your invoice.   Our billing staff will not be able to assist you with questions regarding bills from these companies.  You will be contacted with the lab results as soon as they are available. The fastest way to get your results is to activate your My Chart account. Instructions are located on the last page of this paperwork. If you have not heard from [place] regarding the results in 2 weeks, please contact this office.

## 2020-04-29 ENCOUNTER — Encounter: Payer: Self-pay | Admitting: Family Medicine

## 2020-04-29 ENCOUNTER — Ambulatory Visit (INDEPENDENT_AMBULATORY_CARE_PROVIDER_SITE_OTHER): Payer: No Typology Code available for payment source | Admitting: Family Medicine

## 2020-04-29 ENCOUNTER — Other Ambulatory Visit: Payer: Self-pay

## 2020-04-29 DIAGNOSIS — R972 Elevated prostate specific antigen [PSA]: Secondary | ICD-10-CM

## 2020-04-29 DIAGNOSIS — E785 Hyperlipidemia, unspecified: Secondary | ICD-10-CM

## 2020-04-29 NOTE — Progress Notes (Signed)
Lab only visit - pt not seen.  

## 2020-04-29 NOTE — Progress Notes (Signed)
Lab only visit 

## 2020-05-01 LAB — COMPLETE METABOLIC PANEL WITH GFR
AG Ratio: 1.9 (calc) (ref 1.0–2.5)
ALT: 20 U/L (ref 9–46)
AST: 20 U/L (ref 10–35)
Albumin: 4.5 g/dL (ref 3.6–5.1)
Alkaline phosphatase (APISO): 107 U/L (ref 35–144)
BUN/Creatinine Ratio: 9 (calc) (ref 6–22)
BUN: 12 mg/dL (ref 7–25)
CO2: 30 mmol/L (ref 20–32)
Calcium: 9.9 mg/dL (ref 8.6–10.3)
Chloride: 107 mmol/L (ref 98–110)
Creat: 1.37 mg/dL — ABNORMAL HIGH (ref 0.70–1.25)
GFR, Est African American: 63 mL/min/{1.73_m2} (ref >=60)
GFR, Est Non African American: 55 mL/min/{1.73_m2} — ABNORMAL LOW (ref >=60)
Globulin: 2.4 g/dL (calc) (ref 1.9–3.7)
Glucose, Bld: 103 mg/dL — ABNORMAL HIGH (ref 65–99)
Potassium: 5.1 mmol/L (ref 3.5–5.3)
Sodium: 141 mmol/L (ref 135–146)
Total Bilirubin: 0.6 mg/dL (ref 0.2–1.2)
Total Protein: 6.9 g/dL (ref 6.1–8.1)

## 2020-05-01 LAB — LIPID PANEL
Cholesterol: 183 mg/dL (ref <200)
HDL: 62 mg/dL (ref >=40)
LDL Cholesterol (Calc): 107 mg/dL (calc) — ABNORMAL HIGH
Non-HDL Cholesterol (Calc): 121 mg/dL (calc) (ref <130)
Total CHOL/HDL Ratio: 3 (calc) (ref <5.0)
Triglycerides: 53 mg/dL (ref <150)

## 2020-05-01 LAB — PSA: PSA: 3.8 ng/mL (ref <=4.0)

## 2020-05-01 LAB — EXTRA LAV TOP TUBE

## 2020-05-12 ENCOUNTER — Encounter: Payer: Self-pay | Admitting: Radiology

## 2020-06-05 ENCOUNTER — Other Ambulatory Visit: Payer: Self-pay

## 2020-06-05 ENCOUNTER — Ambulatory Visit (AMBULATORY_SURGERY_CENTER): Payer: Self-pay | Admitting: *Deleted

## 2020-06-05 ENCOUNTER — Encounter: Payer: Self-pay | Admitting: Gastroenterology

## 2020-06-05 VITALS — Ht 68.0 in | Wt 165.0 lb

## 2020-06-05 DIAGNOSIS — Z1211 Encounter for screening for malignant neoplasm of colon: Secondary | ICD-10-CM

## 2020-06-05 MED ORDER — SUTAB 1479-225-188 MG PO TABS: 24.0000 | ORAL_TABLET | ORAL | 0 refills | Status: DC

## 2020-06-05 NOTE — Progress Notes (Signed)
No egg or soy allergy known to patient  No issues with past sedation with any surgeries  or procedures, no intubation problems  No diet pills per patient No home 02 use per patient  No blood thinners per patient  Pt denies issues with constipation  No A fib or A flutter  EMMI video sent to pt's e mail  COVID 19 guidelines implemented in [place] today   06-05-20 will complete covid vaccines- will be 2 weeks so no covid test needed for 7-colon   Due to the COVID-19 pandemic we are asking patients to follow these guidelines. Please only bring one care partner. Please be aware that your care partner may wait in the car in the parking lot or if they feel like they will be too hot to wait in the car, they may wait in the lobby on the 4th floor. All care partners are required to wear a mask the entire time (we do not have any that we can provide them), they need to practice social distancing, and we will do a Covid check for all patient's and care partners when you arrive. Also we will check their temperature and your temperature. If the care partner waits in their car they need to stay in the parking lot the entire time and we will call them on their cell phone when the patient is ready for discharge so they can bring the car to the front of the building. Also all patient's will need to wear a mask into building.

## 2020-06-19 ENCOUNTER — Encounter: Payer: Self-pay | Admitting: Gastroenterology

## 2020-06-19 ENCOUNTER — Other Ambulatory Visit: Payer: Self-pay | Admitting: Gastroenterology

## 2020-06-19 ENCOUNTER — Ambulatory Visit (AMBULATORY_SURGERY_CENTER): Payer: No Typology Code available for payment source | Admitting: Gastroenterology

## 2020-06-19 ENCOUNTER — Other Ambulatory Visit: Payer: Self-pay

## 2020-06-19 VITALS — BP 125/75 | HR 62 | Temp 98.4°F | Resp 14 | Ht 68.0 in | Wt 165.0 lb

## 2020-06-19 DIAGNOSIS — K5730 Diverticulosis of large intestine without perforation or abscess without bleeding: Secondary | ICD-10-CM

## 2020-06-19 DIAGNOSIS — K635 Polyp of colon: Secondary | ICD-10-CM

## 2020-06-19 DIAGNOSIS — Z1211 Encounter for screening for malignant neoplasm of colon: Secondary | ICD-10-CM | POA: Diagnosis present

## 2020-06-19 DIAGNOSIS — K6389 Other specified diseases of intestine: Secondary | ICD-10-CM

## 2020-06-19 MED ORDER — SODIUM CHLORIDE 0.9 % IV SOLN: 500.0000 mL | Freq: Once | INTRAVENOUS | Status: DC

## 2020-06-19 NOTE — Op Note (Signed)
[facility] Patient Name: Donald [name] Procedure Date: 06/19/2020 10:09 AM MRN: [id number] Endoscopist: Doristine Locks , MD Age: 64 Referring MD:  Date of Birth: 08-Aug-1956 Gender: Male Account #: [id number] Procedure:                Colonoscopy Indications:              Screening for colorectal malignant neoplasm (last                            colonoscopy was more than 10 years ago) Medicines:                Monitored Anesthesia Care Procedure:                Pre-Anesthesia Assessment:                           - Prior to the procedure, a History and Physical                            was performed, and patient medications and                            allergies were reviewed. The patient's tolerance of                            previous anesthesia was also reviewed. The risks                            and benefits of the procedure and the sedation                            options and risks were discussed with the patient.                            All questions were answered, and informed consent                            was obtained. Prior Anticoagulants: The patient has                            taken no previous anticoagulant or antiplatelet                            agents. ASA Grade Assessment: II - A patient with                            mild systemic disease. After reviewing the risks                            and benefits, the patient was deemed in                            satisfactory condition to undergo the procedure.  After obtaining informed consent, the colonoscope                            was passed under direct vision. Throughout the                            procedure, the patient's blood pressure, pulse, and                            oxygen saturations were monitored continuously. The                            Colonoscope was introduced through the anus and                            advanced to the 10 cm  into the ileum. The                            colonoscopy was performed without difficulty. The                            patient tolerated the procedure well. The quality                            of the bowel preparation was excellent. The                            terminal ileum, ileocecal valve, appendiceal                            orifice, and rectum were photographed. Scope In: 10:18:37 AM Scope Out: 11:08:48 AM Scope Withdrawal Time: 0 hours 47 minutes 9 seconds  Total Procedure Duration: 0 hours 50 minutes 11 seconds  Findings:                 The perianal and digital rectal examinations were                            normal.                           Multiple small and large-mouthed diverticula were                            found in the sigmoid colon and ascending colon.                           The terminal ileum contained one pedunculated                            polyp. The polyp was 40 mm in diameter with a                            large, thick stalk. Area was successfully injected  with 3 mL of a 1:10,000 solution of epinephrine to                            shrink the polyp and preparation for polypectomy.                            An endoloop was maneuvered over the polyp stalk and                            closed at the mucosal attachment prior to removal                            in order to prevent bleeding. The polyp was removed                            with a hot snare using combination of forced coag                            2, 18 and pulse cut. Resection and retrieval were                            complete. To prevent bleeding after the                            polypectomy, two hemostatic clips were successfully                            placed. There was no bleeding at the end of the                            procedure and the polyp was removed in 2 pieces via                            [employer]. The remainder of  the ileum was normal                            appearing, advancing approximately 8 cm proximal to                            the large polyp. Complications:            No immediate complications. Estimated Blood Loss:     Estimated blood loss was minimal. Impression:               - Diverticulosis in the sigmoid colon and in the                            ascending colon.                           - One polyp in the terminal ileum. Injected with  epinephrine then removed using an Endoloop and a                            hot snare. Resected and retrieved. Clips were                            placed for prophylaxis given location and size of                            polyp resected. Recommendation:           - Patient has a contact number available for                            emergencies. The signs and symptoms of potential                            delayed complications were discussed with the                            patient. Return to normal activities tomorrow.                            Written discharge instructions were provided to the                            patient.                           - Resume previous diet.                           - Continue present medications.                           - Await pathology results.                           - Repeat colonoscopy in 6 months for surveillance.                           - Return to GI clinic at appointment to be                            scheduled. Doristine Locks, MD 06/19/2020 11:23:29 AM

## 2020-06-19 NOTE — Progress Notes (Signed)
Report to PACU, RN, vss, BBS= Clear.  

## 2020-06-19 NOTE — Progress Notes (Signed)
Called to room to assist during endoscopic procedure.  Patient ID and intended procedure confirmed with present staff. Received instructions for my participation in the procedure from the performing physician.  Terminal ileum polyp injected 3cc of epinepherine  Polly-loop applied

## 2020-06-19 NOTE — Progress Notes (Signed)
Pt's states no medical or surgical changes since previsit or office visit. 

## 2020-06-19 NOTE — Patient Instructions (Signed)
Thank you for allowing [place] to care for you today!  Await biopsy results of the polyp removed.  Those results will be back in 1-2 weeks.   Repeat colonoscopy in 6 months to see area of large polyp that was removed today.  Appointment with Dr Carloyn Manner, we will call you to arrange the appointment.  Resume previous diet and medications today.  Return to your normal activities tomorrow.   YOU HAD AN ENDOSCOPIC PROCEDURE TODAY AT THE [facility]:   Refer to the procedure report that was given to you for any specific questions about what was found during the examination.  If the procedure report does not answer your questions, please call your gastroenterologist to clarify.  If you requested that your care partner not be given the details of your procedure findings, then the procedure report has been included in a sealed envelope for you to review at your convenience later.  YOU SHOULD EXPECT: Some feelings of bloating in the abdomen. Passage of more gas than usual.  Walking can help get rid of the air that was put into your GI tract during the procedure and reduce the bloating. If you had a lower endoscopy (such as a colonoscopy or flexible sigmoidoscopy) you may notice spotting of blood in your stool or on the toilet paper. If you underwent a bowel prep for your procedure, you may not have a normal bowel movement for a few days.  Please Note:  You might notice some irritation and congestion in your nose or some drainage.  This is from the oxygen used during your procedure.  There is no need for concern and it should clear up in a day or so.  SYMPTOMS TO REPORT IMMEDIATELY:   Following lower endoscopy (colonoscopy or flexible sigmoidoscopy):  Excessive amounts of blood in the stool  Significant tenderness or worsening of abdominal pains  Swelling of the abdomen that is new, acute  Fever of 100F or higher   For urgent or emergent issues, a gastroenterologist can be reached at any  hour by calling [phone]. Do not use MyChart messaging for urgent concerns.    DIET:  We do recommend a small meal at first, but then you may proceed to your regular diet.  Drink plenty of fluids but you should avoid alcoholic beverages for 24 hours.  ACTIVITY:  You should plan to take it easy for the rest of today and you should NOT DRIVE or use heavy machinery until tomorrow (because of the sedation medicines used during the test).    FOLLOW UP: Our staff will call the number listed on your records 48-72 hours following your procedure to check on you and address any questions or concerns that you may have regarding the information given to you following your procedure. If we do not reach you, we will leave a message.  We will attempt to reach you two times.  During this call, we will ask if you have developed any symptoms of COVID 19. If you develop any symptoms (ie: fever, flu-like symptoms, shortness of breath, cough etc.) before then, please call [phone].  If you test positive for Covid 19 in the 2 weeks post procedure, please call and report this information to [place].    If any biopsies were taken you will be contacted by phone or by letter within the next 1-3 weeks.  Please call us at [phone] if you have not heard about the biopsies in 3 weeks.    SIGNATURES/CONFIDENTIALITY: You  and/or your care partner have signed paperwork which will be entered into your electronic medical record.  These signatures attest to the fact that that the information above on your After Visit Summary has been reviewed and is understood.  Full responsibility of the confidentiality of this discharge information lies with you and/or your care-partner.

## 2020-06-23 ENCOUNTER — Telehealth: Payer: Self-pay

## 2020-06-23 NOTE — Telephone Encounter (Signed)
  Follow up Call- ° °Call back number 06/19/2020  °Post procedure Call Back phone  # [phone]  °Permission to leave phone message Yes  °Some recent data might be hidden  °  ° °Patient questions: ° °Do you have a fever, pain , or abdominal swelling? No. °Pain Score  0 * ° °Have you tolerated food without any problems? Yes.   ° °Have you been able to return to your normal activities? Yes.   ° °Do you have any questions about your discharge instructions: °Diet   No. °Medications  No. °Follow up visit  No. ° °Do you have questions or concerns about your Care? No. ° °Actions: °* If pain score is 4 or above: °No action needed, pain <4. ° °1. Have you developed a fever since your procedure? no ° °2.   Have you had an respiratory symptoms (SOB or cough) since your procedure? no ° °3.   Have you tested positive for COVID 19 since your procedure no ° °4.   Have you had any family members/close contacts diagnosed with the COVID 19 since your procedure?  no ° ° °If yes to any of these questions please route to Tracy Walton, RN and Mike Marshall, RN ° ° °

## 2020-06-23 NOTE — Telephone Encounter (Signed)
Error maw  

## 2020-06-23 NOTE — Telephone Encounter (Signed)
°  Follow up Call-  Call back number 06/19/2020  Post procedure Call Back phone  # [phone]  Permission to leave phone message Yes  Some recent data might be hidden     Patient questions:  Do you have a fever, pain , or abdominal swelling? No. Pain Score  0 *  Have you tolerated food without any problems? Yes.    Have you been able to return to your normal activities? Yes.    Do you have any questions about your discharge instructions: Diet   No. Medications  No. Follow up visit  No.  Do you have questions or concerns about your Care? No.  Actions: * If pain score is 4 or above: No action needed, pain <4.  1. Have you developed a fever since your procedure? no  2.   Have you had an respiratory symptoms (SOB or cough) since your procedure? no  3.   Have you tested positive for COVID 19 since your procedure no  4.   Have you had any family members/close contacts diagnosed with the COVID 19 since your procedure?  no   If yes to any of these questions please route to Laverna Peace, RN and Charlett Lango, RN

## 2020-07-02 ENCOUNTER — Encounter: Payer: Self-pay | Admitting: Gastroenterology

## 2020-09-09 ENCOUNTER — Other Ambulatory Visit: Payer: Self-pay

## 2020-09-09 ENCOUNTER — Ambulatory Visit (INDEPENDENT_AMBULATORY_CARE_PROVIDER_SITE_OTHER): Payer: No Typology Code available for payment source | Admitting: Registered Nurse

## 2020-09-09 ENCOUNTER — Encounter: Payer: Self-pay | Admitting: Registered Nurse

## 2020-09-09 VITALS — BP 125/79 | HR 70 | Temp 98.0°F | Resp 18 | Ht 68.0 in | Wt 161.0 lb

## 2020-09-09 DIAGNOSIS — R4189 Other symptoms and signs involving cognitive functions and awareness: Secondary | ICD-10-CM

## 2020-09-09 DIAGNOSIS — E785 Hyperlipidemia, unspecified: Secondary | ICD-10-CM

## 2020-09-09 NOTE — Patient Instructions (Signed)
° ° ° °  If you have lab work done today you will be contacted with your lab results within the next 2 weeks.  If you have not heard from us then please contact us. The fastest way to get your results is to register for My Chart. ° ° °IF you received an x-ray today, you will receive an invoice from [facility]. Please contact [facility] at [phone] with questions or concerns regarding your invoice.  ° °IF you received labwork today, you will receive an invoice from [facility]. Please contact [facility] at [phone] with questions or concerns regarding your invoice.  ° °Our billing staff will not be able to assist you with questions regarding bills from these companies. ° °You will be contacted with the lab results as soon as they are available. The fastest way to get your results is to activate your My Chart account. Instructions are located on the last page of this paperwork. If you have not heard from us regarding the results in 2 weeks, please contact this office. °  ° ° ° °

## 2020-09-10 ENCOUNTER — Ambulatory Visit: Payer: No Typology Code available for payment source

## 2020-09-11 ENCOUNTER — Ambulatory Visit: Payer: No Typology Code available for payment source

## 2020-09-11 ENCOUNTER — Other Ambulatory Visit: Payer: Self-pay

## 2020-09-12 LAB — URINALYSIS
Bilirubin, UA: NEGATIVE
Glucose, UA: NEGATIVE
Ketones, UA: NEGATIVE
Leukocytes,UA: NEGATIVE
Nitrite, UA: NEGATIVE
Protein,UA: NEGATIVE
RBC, UA: NEGATIVE
Specific Gravity, UA: 1.017 (ref 1.005–1.030)
Urobilinogen, Ur: 0.2 mg/dL (ref 0.2–1.0)
pH, UA: 6 (ref 5.0–7.5)

## 2020-09-12 LAB — CBC WITH DIFFERENTIAL
Basophils Absolute: 0 10*3/uL (ref 0.0–0.2)
Basos: 1 %
EOS (ABSOLUTE): 0 10*3/uL (ref 0.0–0.4)
Eos: 1 %
Hematocrit: 48.6 % (ref 37.5–51.0)
Hemoglobin: 16.1 g/dL (ref 13.0–17.7)
Immature Grans (Abs): 0 10*3/uL (ref 0.0–0.1)
Immature Granulocytes: 0 %
Lymphocytes Absolute: 2.1 10*3/uL (ref 0.7–3.1)
Lymphs: 49 %
MCH: 30.1 pg (ref 26.6–33.0)
MCHC: 33.1 g/dL (ref 31.5–35.7)
MCV: 91 fL (ref 79–97)
Monocytes Absolute: 0.4 10*3/uL (ref 0.1–0.9)
Monocytes: 8 %
Neutrophils Absolute: 1.8 10*3/uL (ref 1.4–7.0)
Neutrophils: 41 %
RBC: 5.34 x10E6/uL (ref 4.14–5.80)
RDW: 13.1 % (ref 11.6–15.4)
WBC: 4.3 10*3/uL (ref 3.4–10.8)

## 2020-09-12 LAB — COMPREHENSIVE METABOLIC PANEL
ALT: 23 IU/L (ref 0–44)
AST: 24 IU/L (ref 0–40)
Albumin/Globulin Ratio: 1.7 (ref 1.2–2.2)
Albumin: 4.8 g/dL (ref 3.8–4.8)
Alkaline Phosphatase: 124 IU/L — ABNORMAL HIGH (ref 44–121)
BUN/Creatinine Ratio: 9 — ABNORMAL LOW (ref 10–24)
BUN: 10 mg/dL (ref 8–27)
Bilirubin Total: 0.5 mg/dL (ref 0.0–1.2)
CO2: 26 mmol/L (ref 20–29)
Calcium: 10.1 mg/dL (ref 8.6–10.2)
Chloride: 103 mmol/L (ref 96–106)
Creatinine, Ser: 1.17 mg/dL (ref 0.76–1.27)
GFR calc Af Amer: 76 mL/min/{1.73_m2} (ref >59)
GFR calc non Af Amer: 66 mL/min/{1.73_m2} (ref >59)
Globulin, Total: 2.8 g/dL (ref 1.5–4.5)
Glucose: 96 mg/dL (ref 65–99)
Potassium: 4.3 mmol/L (ref 3.5–5.2)
Sodium: 143 mmol/L (ref 134–144)
Total Protein: 7.6 g/dL (ref 6.0–8.5)

## 2020-09-12 LAB — LIPID PANEL
Chol/HDL Ratio: 3.2 ratio (ref 0.0–5.0)
Cholesterol, Total: 204 mg/dL — ABNORMAL HIGH (ref 100–199)
HDL: 63 mg/dL (ref >39)
LDL Chol Calc (NIH): 132 mg/dL — ABNORMAL HIGH (ref 0–99)
Triglycerides: 50 mg/dL (ref 0–149)
VLDL Cholesterol Cal: 9 mg/dL (ref 5–40)

## 2020-09-12 LAB — VITAMIN B12: Vitamin B-12: 1975 pg/mL — ABNORMAL HIGH (ref 232–1245)

## 2020-09-12 LAB — TSH: TSH: 0.94 u[IU]/mL (ref 0.450–4.500)

## 2020-09-12 LAB — VITAMIN D 25 HYDROXY (VIT D DEFICIENCY, FRACTURES): Vit D, 25-Hydroxy: 43 ng/mL (ref 30.0–100.0)

## 2020-09-20 ENCOUNTER — Encounter: Payer: Self-pay | Admitting: Registered Nurse

## 2020-10-01 ENCOUNTER — Other Ambulatory Visit: Payer: No Typology Code available for payment source

## 2020-10-26 ENCOUNTER — Encounter: Payer: Self-pay | Admitting: Family Medicine

## 2020-10-26 ENCOUNTER — Other Ambulatory Visit: Payer: Self-pay

## 2020-10-26 ENCOUNTER — Ambulatory Visit: Payer: No Typology Code available for payment source | Admitting: Family Medicine

## 2020-10-26 VITALS — BP 120/76 | HR 85 | Temp 98.0°F | Ht 68.0 in | Wt 161.0 lb

## 2020-10-26 DIAGNOSIS — R4189 Other symptoms and signs involving cognitive functions and awareness: Secondary | ICD-10-CM

## 2020-10-26 DIAGNOSIS — L720 Epidermal cyst: Secondary | ICD-10-CM | POA: Diagnosis not present

## 2020-10-26 DIAGNOSIS — R413 Other amnesia: Secondary | ICD-10-CM

## 2020-10-26 NOTE — Progress Notes (Signed)
Subjective:  Patient ID: Donald [name], male    DOB: 12-03-1956  Age: 64 y.o. MRN: [id number]  CC:  Chief Complaint  Patient presents with  . Follow-up    from head CT. PT states he thinks he had it done last week. pt states the provider should have those results.  . Insect Bite    Pt states he had a bug bit on his back a few months ago and pt attempted to squeez it since hten the pt has noticed the area growing.    HPI Donald [name] presents for   Cognitive decline/memory concerns: Discussed 11 September 2019 visit with [name].  B12 elevated at that time.  TSH was normal, CBC normal, vitamin D normal.  CMP with alk phos 124, otherwise appeared normal.  Head CT was ordered?  Has had some concerns with memory - feel like more forgetful past 3-4 months. No family changes or stressors. Wife and daughter have noticed him being more forgetful. denies anxiety/depression or feeling stressed.  Wife in control of finances. ADL's intact.  No headaches.   MRI head performed 10/20/20.   EXAMINATION: Brain MRI without contrast   CLINICAL INDICATION: Forgetfulness, dementia   TECHNIQUE: MRI brain protocol without contrast.   FINDINGS:   There are several small foci of T2 hyperintensity in the periventricular cerebral white matter. These do not have restricted diffusion.   There is no acute or chronic infarct.   There is normal flow signal void in both internal carotid arteries and the basilar artery.   There is no hemorrhage.   There is no mass lesion.   There is no hydrocephalus.   The bone marrow signal is normal.   The paranasal sinuses are clear.   Extracranial soft tissues are normal.    IMPRESSION:   Mild nonspecific white matter disease.   Otherwise normal.     Lesion of back Off and on past 2-3 months. Possible initial bug bite. Small bump initially, wife trid to squeeze - some white d/c- few months ago. Was stable, but felt a little bigger in shower  yesterday - squeezed - nothing expressed.bigger after squeezing it.  No fever.  No other lesions/rash.    History Patient Active Problem List   Diagnosis Date Noted  . Annual physical exam 04/03/2017   Past Medical History:  Diagnosis Date  . Arthritis    Past Surgical History:  Procedure Laterality Date  . APPENDECTOMY     Allergies  Allergen Reactions  . Sulfa Antibiotics Other (See Comments)    Nausea and eye swelling    Prior to Admission medications   Medication Sig Start Date End Date Taking? Authorizing Provider  Fish Oil OIL by Does not apply route.   Yes [provider]  Multiple Vitamins-Minerals (MENS 50+ MULTI VITAMIN/MIN PO) Take by mouth.   Yes [provider]  [place] works daily   Yes [provider]   Social History   Socioeconomic History  . Marital status: Married    Spouse name: Not on file  . Number of children: Not on file  . Years of education: Not on file  . Highest education level: Not on file  Occupational History  . Not on file  Tobacco Use  . Smoking status: Never Smoker  . Smokeless tobacco: Never Used  Substance and Sexual Activity  . Alcohol use: No  . Drug use: No  . Sexual activity: Yes    Birth control/protection: None  Other Topics Concern  . Not on file  Social History Narrative  . Not on file   Social Determinants of Health   Financial Resource Strain:   . Difficulty of Paying Living Expenses: Not on file  Food Insecurity:   . Worried About Charity fundraiser in the Last Year: Not on file  . Ran Out of Food in the Last Year: Not on file  Transportation Needs:   . Lack of Transportation (Medical): Not on file  . Lack of Transportation (Non-Medical): Not on file  Physical Activity:   . Days of Exercise per Week: Not on file  . Minutes of Exercise per Session: Not on file  Stress:   . Feeling of Stress : Not on file  Social Connections:   . Frequency of  Communication with Friends and Family: Not on file  . Frequency of Social Gatherings with Friends and Family: Not on file  . Attends Religious Services: Not on file  . Active Member of Clubs or Organizations: Not on file  . Attends Archivist Meetings: Not on file  . Marital Status: Not on file  Intimate Partner Violence:   . Fear of Current or Ex-Partner: Not on file  . Emotionally Abused: Not on file  . Physically Abused: Not on file  . Sexually Abused: Not on file    Review of Systems   Objective:   Vitals:   10/26/20 1443  BP: 120/76  Pulse: 85  Temp: 98 F (36.7 C)  TempSrc: Temporal  SpO2: 100%  Weight: 161 lb (73 kg)  Height: $Remove'5\' 8"'eLGASoA$  (1.727 m)     Physical Exam Vitals reviewed.  Constitutional:      Appearance: He is well-developed.  HENT:     Head: Normocephalic and atraumatic.  Eyes:     Pupils: Pupils are equal, round, and reactive to light.  Neck:     Vascular: No carotid bruit or JVD.  Cardiovascular:     Rate and Rhythm: Normal rate and regular rhythm.     Heart sounds: Normal heart sounds. No murmur heard.   Pulmonary:     Effort: Pulmonary effort is normal.     Breath sounds: Normal breath sounds. No rales.  Skin:    General: Skin is warm and dry.     Comments: R posterior shoulder - cystic structure, no surrounding erythema or fluctuance. Dark center, no d/c. See photo   Neurological:     Mental Status: He is alert and oriented to person, place, and time.  Psychiatric:        Mood and Affect: Mood normal.        Behavior: Behavior normal.        Thought Content: Thought content normal.        Judgment: Judgment normal.   MOCA score 10/30.      34 minutes spent during visit, greater than 50% counseling and assimilation of information, chart review, and discussion of plan.     Assessment & Plan:  Donald [name] is a 64 y.o. male . Cognitive decline Memory change  -With nonspecific white matter findings on MRI.  Denies new  stressors, depression/anxiety.  Refer to neurology for further evaluation and discussion of MRI results  Epidermal cyst  -Benign appearing, does not appear infected.  RTC precautions, handout given.  No orders of the defined types were placed in this encounter.  Patient Instructions       If you have lab work done today you will  be contacted with your lab results within the next 2 weeks.  If you have not heard from [place] then please contact us. The fastest way to get your results is to register for My Chart.   IF you received an x-ray today, you will receive an invoice from [facility]. Please contact [facility] at [phone] with questions or concerns regarding your invoice.   IF you received labwork today, you will receive an invoice from [facility]. Please contact [facility] at [phone] with questions or concerns regarding your invoice.   Our billing staff will not be able to assist you with questions regarding bills from these companies.  You will be contacted with the lab results as soon as they are available. The fastest way to get your results is to activate your My Chart account. Instructions are located on the last page of this paperwork. If you have not heard from [place] regarding the results in 2 weeks, please contact this office.         Signed, Merri Ray, MD [facility]

## 2020-10-26 NOTE — Patient Instructions (Addendum)
Area on your back appears to be a cyst, and appears to be benign.  No treatment needed at this time.  If it does become inflamed, irritated, painful or draining, return for recheck as they can sometimes become infected.  I will refer you to a neurologist to evaluate the memory concerns as well as to review the results of your MRI further.  Let me know if there are questions in the meantime.   Epidermal Cyst  An epidermal cyst is a sac made of skin tissue. The sac contains a substance called keratin. Keratin is a protein that is normally secreted through the hair follicles. When keratin becomes trapped in the top layer of skin (epidermis), it can form an epidermal cyst. Epidermal cysts can be found anywhere on your body. These cysts are usually harmless (benign), and they may not cause symptoms unless they become infected. What are the causes? This condition may be caused by:  A blocked hair follicle.  A hair that curls and re-enters the skin instead of growing straight out of the skin (ingrown hair).  A blocked pore.  Irritated skin.  An injury to the skin.  Certain conditions that are passed along from parent to child (inherited).  Human papillomavirus (HPV).  Long-term (chronic) sun damage to the skin. What increases the risk? The following factors may make you more likely to develop an epidermal cyst:  Having acne.  Being overweight.  Being 60-56 years old. What are the signs or symptoms? The only symptom of this condition may be a small, painless lump underneath the skin. When an epidermal cyst ruptures, it may become infected. Symptoms may include:  Redness.  Inflammation.  Tenderness.  Warmth.  Fever.  Keratin draining from the cyst. Keratin is grayish-white, bad-smelling substance.  Pus draining from the cyst. How is this diagnosed? This condition is diagnosed with a physical exam.  In some cases, you may have a sample of tissue (biopsy) taken from your  cyst to be examined under a microscope or tested for bacteria.  You may be referred to a health care provider who specializes in skin care (dermatologist). How is this treated? In many cases, epidermal cysts go away on their own without treatment. If a cyst becomes infected, treatment may include:  Opening and draining the cyst, done by a health care provider. After draining, minor surgery to remove the rest of the cyst may be done.  Antibiotic medicine.  Injections of medicines (steroids) that help to reduce inflammation.  Surgery to remove the cyst. Surgery may be done if the cyst: ? Becomes large. ? Bothers you. ? Has a chance of turning into cancer.  Do not try to open a cyst yourself. Follow these instructions at home:  Take over-the-counter and prescription medicines only as told by your health care provider.  If you were prescribed an antibiotic medicine, take it it as told by your health care provider. Do not stop using the antibiotic even if you start to feel better.  Keep the area around your cyst clean and dry.  Wear loose, dry clothing.  Avoid touching your cyst.  Check your cyst every day for signs of infection. Check for: ? Redness, swelling, or pain. ? Fluid or blood. ? Warmth. ? Pus or a bad smell.  Keep all follow-up visits as told by your health care provider. This is important. How is this prevented?  Wear clean, dry, clothing.  Avoid wearing tight clothing.  Keep your skin clean and dry. Take  showers or baths every day. Contact a health care provider if:  Your cyst develops symptoms of infection.  Your condition is not improving or is getting worse.  You develop a cyst that looks different from other cysts you have had.  You have a fever. Get help right away if:  Redness spreads from the cyst into the surrounding area. Summary  An epidermal cyst is a sac made of skin tissue. These cysts are usually harmless (benign), and they may not cause  symptoms unless they become infected.  If a cyst becomes infected, treatment may include surgery to open and drain the cyst, or to remove it. Treatment may also include medicines by mouth or through an injection.  Take over-the-counter and prescription medicines only as told by your health care provider. If you were prescribed an antibiotic medicine, take it as told by your health care provider. Do not stop using the antibiotic even if you start to feel better.  Contact a health care provider if your condition is not improving or is getting worse.  Keep all follow-up visits as told by your health care provider. This is important. This information is not intended to replace advice given to you by your health care provider. Make sure you discuss any questions you have with your health care provider. Document Revised: 03/21/2019 Document Reviewed: 06/11/2018 Elsevier Patient Education  [employer].   If you have lab work done today you will be contacted with your lab results within the next 2 weeks.  If you have not heard from [place] then please contact us. The fastest way to get your results is to register for My Chart.   IF you received an x-ray today, you will receive an invoice from [facility]. Please contact [facility] at [phone] with questions or concerns regarding your invoice.   IF you received labwork today, you will receive an invoice from [place]. Please contact [facility] at [phone] with questions or concerns regarding your invoice.   Our billing staff will not be able to assist you with questions regarding bills from these companies.  You will be contacted with the lab results as soon as they are available. The fastest way to get your results is to activate your My Chart account. Instructions are located on the last page of this paperwork. If you have not heard from [place] regarding the results in 2 weeks, please contact this office.

## 2020-11-08 ENCOUNTER — Encounter: Payer: Self-pay | Admitting: Registered Nurse

## 2020-11-08 NOTE — Progress Notes (Signed)
Established Patient Office Visit  Subjective:  Patient ID: Donald [name], male    DOB: [birth year]  Age: 64 y.o. MRN: [id number]  CC:  Chief Complaint  Patient presents with   Memory Loss    patient states in the last 2-3  months he has noticed that he has been forgetting things like dates and times. Per patient he also have been having memory loss about places that he know he has been to and it feels like the first time    HPI Donald [name] presents for memory loss Has happened somewhat in the past but now having progression over the past 2-3 months. Notes that he is forgetting dates and times. Forgetting some longer term things too, like places and people he should know. No injury or other neuro symptoms No other lifestyle modifications that may have caused this to patients knowledge No medication changes  Past Medical History:  Diagnosis Date   Arthritis     Past Surgical History:  Procedure Laterality Date   APPENDECTOMY      Family History  Problem Relation Age of Onset   Hypertension Mother    Colon polyps Neg Hx    Colon cancer Neg Hx    Esophageal cancer Neg Hx    Rectal cancer Neg Hx    Stomach cancer Neg Hx     Social History   Socioeconomic History   Marital status: Married    Spouse name: Not on file   Number of children: Not on file   Years of education: Not on file   Highest education level: Not on file  Occupational History   Not on file  Tobacco Use   Smoking status: Never Smoker   Smokeless tobacco: Never Used  Substance and Sexual Activity   Alcohol use: No   Drug use: No   Sexual activity: Yes    Birth control/protection: None  Other Topics Concern   Not on file  Social History Narrative   Not on file   Social Determinants of Health   Financial Resource Strain:    Difficulty of Paying Living Expenses: Not on file  Food Insecurity:    Worried About Running Out of Food in the Last Year: Not on file    [employer] of Food in the Last Year: Not on file  Transportation Needs:    Lack of Transportation (Medical): Not on file   Lack of Transportation (Non-Medical): Not on file  Physical Activity:    Days of Exercise per Week: Not on file   Minutes of Exercise per Session: Not on file  Stress:    Feeling of Stress : Not on file  Social Connections:    Frequency of Communication with Friends and Family: Not on file   Frequency of Social Gatherings with Friends and Family: Not on file   Attends Religious Services: Not on file   Active Member of Clubs or Organizations: Not on file   Attends Banker Meetings: Not on file   Marital Status: Not on file  Intimate Partner Violence:    Fear of Current or Ex-Partner: Not on file   Emotionally Abused: Not on file   Physically Abused: Not on file   Sexually Abused: Not on file    Outpatient Medications Prior to Visit  Medication Sig Dispense Refill   Fish Oil OIL by Does not apply route.     Multiple Vitamins-Minerals (MENS 50+ MULTI VITAMIN/MIN PO) Take by mouth.     OVER THE  COUNTER MEDICATION [employer] works daily     No facility-administered medications prior to visit.    Allergies  Allergen Reactions   Sulfa Antibiotics Other (See Comments)    Nausea and eye swelling     ROS Review of Systems  Constitutional: Negative.   HENT: Negative.   Eyes: Negative.   Respiratory: Negative.   Cardiovascular: Negative.   Gastrointestinal: Negative.   Genitourinary: Negative.   Musculoskeletal: Negative.   Skin: Negative.   Neurological: Negative.   Psychiatric/Behavioral: Negative.       Objective:    Physical Exam Constitutional:      General: He is not in acute distress.    Appearance: Normal appearance. He is normal weight. He is not ill-appearing, toxic-appearing or diaphoretic.  Cardiovascular:     Rate and Rhythm: Normal rate and regular rhythm.     Heart sounds: Normal heart sounds. No murmur  heard.  No friction rub. No gallop.   Pulmonary:     Effort: Pulmonary effort is normal. No respiratory distress.     Breath sounds: Normal breath sounds. No stridor. No wheezing, rhonchi or rales.  Chest:     Chest wall: No tenderness.  Neurological:     General: No focal deficit present.     Mental Status: He is alert and oriented to person, place, and time. Mental status is at baseline.  Psychiatric:        Mood and Affect: Mood normal.        Behavior: Behavior normal.        Thought Content: Thought content normal.        Judgment: Judgment normal.     BP 125/79    Pulse 70    Temp 98 F (36.7 C) (Temporal)    Resp 18    Ht 5\' 8"  (1.727 m)    Wt 161 lb (73 kg)    SpO2 97%    BMI 24.48 kg/m  Wt Readings from Last 3 Encounters:  10/26/20 161 lb (73 kg)  09/09/20 161 lb (73 kg)  06/19/20 165 lb (74.8 kg)     Health Maintenance Due  Topic Date Due   TETANUS/TDAP  Never done    There are no preventive care reminders to display for this patient.  Lab Results  Component Value Date   TSH 0.940 09/11/2020   Lab Results  Component Value Date   WBC 4.3 09/11/2020   HGB 16.1 09/11/2020   HCT 48.6 09/11/2020   MCV 91 09/11/2020   PLT 341 05/03/2018   Lab Results  Component Value Date   NA 143 09/11/2020   K 4.3 09/11/2020   CO2 26 09/11/2020   GLUCOSE 96 09/11/2020   BUN 10 09/11/2020   CREATININE 1.17 09/11/2020   BILITOT 0.5 09/11/2020   ALKPHOS 124 (H) 09/11/2020   AST 24 09/11/2020   ALT 23 09/11/2020   PROT 7.6 09/11/2020   ALBUMIN 4.8 09/11/2020   CALCIUM 10.1 09/11/2020   Lab Results  Component Value Date   CHOL 204 (H) 09/11/2020   Lab Results  Component Value Date   HDL 63 09/11/2020   Lab Results  Component Value Date   LDLCALC 132 (H) 09/11/2020   Lab Results  Component Value Date   TRIG 50 09/11/2020   Lab Results  Component Value Date   CHOLHDL 3.2 09/11/2020   No results found for: HGBA1C    Assessment & Plan:   Problem  List Items Addressed This Visit  None    Visit Diagnoses    Cognitive decline    -  Primary   Relevant Orders   CBC With Differential (Completed)   Comprehensive metabolic panel (Completed)   TSH (Completed)   POCT glycosylated hemoglobin (Hb A1C)   Urinalysis (Completed)   Vitamin B12 (Completed)   Vitamin D, 25-hydroxy (Completed)   CT Head Wo Contrast   Hyperlipidemia, unspecified hyperlipidemia type       Relevant Orders   Lipid panel (Completed)      No orders of the defined types were placed in this encounter.   Follow-up: No follow-ups on file.   PLAN  Ct head to rule out anatomical abnormalities  Reestablish with neuro  Labs collected to check for endocrine or metabolic abnormality  Return prn  Patient encouraged to call clinic with any questions, comments, or concerns.  Janeece Agee, NP

## 2020-11-09 ENCOUNTER — Encounter: Payer: Self-pay | Admitting: Family Medicine

## 2020-11-09 ENCOUNTER — Other Ambulatory Visit: Payer: Self-pay

## 2020-11-09 ENCOUNTER — Ambulatory Visit (INDEPENDENT_AMBULATORY_CARE_PROVIDER_SITE_OTHER): Payer: No Typology Code available for payment source | Admitting: Family Medicine

## 2020-11-09 VITALS — BP 128/84 | HR 55 | Temp 97.6°F | Ht 68.0 in | Wt 159.0 lb

## 2020-11-09 DIAGNOSIS — Z7184 Encounter for health counseling related to travel: Secondary | ICD-10-CM

## 2020-11-09 DIAGNOSIS — Z7189 Other specified counseling: Secondary | ICD-10-CM | POA: Diagnosis not present

## 2020-11-09 DIAGNOSIS — Z23 Encounter for immunization: Secondary | ICD-10-CM | POA: Diagnosis not present

## 2020-11-09 DIAGNOSIS — Z7185 Encounter for immunization safety counseling: Secondary | ICD-10-CM

## 2020-11-09 DIAGNOSIS — Z298 Encounter for other specified prophylactic measures: Secondary | ICD-10-CM

## 2020-11-09 MED ORDER — MEFLOQUINE HCL 250 MG PO TABS: 250.0000 mg | ORAL_TABLET | ORAL | 0 refills | Status: DC

## 2020-11-09 NOTE — Progress Notes (Signed)
Subjective:  Patient ID: Donald [name], male    DOB: 1956/10/15  Age: 64 y.o. MRN: [id number]  CC:  Chief Complaint  Patient presents with  . Travel Consult    Pt reports he is going to [place] in [place] and would like a prescripition or recommendation for medication to protect him from [place] while he is there. Mefloquine is the medcaition that was recommended to the pt for his travels.    HPI Donald [name] presents for   Travel counseling  Will be traveling to [place], [place] in 2 weeks. Spending about 64months there.  Visiting school, will be trying to make improvements.  Requests malaria prophylaxis with mefloquine. Taken in past and tolerated.   Immunization History  Administered Date(s) Administered  . Hepatitis A, Adult 04/28/2015  . Tdap 11/09/2020  COVID-19 vaccine: earlier this year, last dose 6/22 (addditional hx provided by spouse on phone - permission granted from pt).  Flu vaccine: today.   Tdap - given today.  unknown if 2nd hep A vaccine - declines today.  Took oral typhoid vaccine (vivotif in 03/2018) per chart review.  Deferred hep B vaccine, unsure of MMR - deferred.   History Patient Active Problem List   Diagnosis Date Noted  . Annual physical exam 04/03/2017   Past Medical History:  Diagnosis Date  . Arthritis    Past Surgical History:  Procedure Laterality Date  . APPENDECTOMY     Allergies  Allergen Reactions  . Sulfa Antibiotics Other (See Comments)    Nausea and eye swelling    Prior to Admission medications   Medication Sig Start Date End Date Taking? Authorizing Provider  Fish Oil OIL by Does not apply route.   Yes [provider]  Multiple Vitamins-Minerals (MENS 50+ MULTI VITAMIN/MIN PO) Take by mouth.   Yes [provider]  [place] works daily   Yes [provider]   Social History   Socioeconomic History  . Marital status: Married    Spouse name: Not on file  .  Number of children: Not on file  . Years of education: Not on file  . Highest education level: Not on file  Occupational History  . Not on file  Tobacco Use  . Smoking status: Never Smoker  . Smokeless tobacco: Never Used  Substance and Sexual Activity  . Alcohol use: No  . Drug use: No  . Sexual activity: Yes    Birth control/protection: None  Other Topics Concern  . Not on file  Social History Narrative  . Not on file   Social Determinants of Health   Financial Resource Strain:   . Difficulty of Paying Living Expenses: Not on file  Food Insecurity:   . Worried About Charity fundraiser in the Last Year: Not on file  . Ran Out of Food in the Last Year: Not on file  Transportation Needs:   . Lack of Transportation (Medical): Not on file  . Lack of Transportation (Non-Medical): Not on file  Physical Activity:   . Days of Exercise per Week: Not on file  . Minutes of Exercise per Session: Not on file  Stress:   . Feeling of Stress : Not on file  Social Connections:   . Frequency of Communication with Friends and Family: Not on file  . Frequency of Social Gatherings with Friends and Family: Not on file  . Attends Religious Services: Not on file  . Active Member of Clubs or Organizations: Not  on file  . Attends Archivist Meetings: Not on file  . Marital Status: Not on file  Intimate Partner Violence:   . Fear of Current or Ex-Partner: Not on file  . Emotionally Abused: Not on file  . Physically Abused: Not on file  . Sexually Abused: Not on file    Review of Systems Per HPI.   Objective:   Vitals:   11/09/20 1100 11/09/20 1105  BP: (!) 155/88 128/84  Pulse: (!) 55   Temp: 97.6 F (36.4 C)   TempSrc: Temporal   SpO2: 100%   Weight: 159 lb (72.1 kg)   Height: $Remove'5\' 8"'rADIwrw$  (1.727 m)      Physical Exam   33 minutes spent during visit, greater than 50% counseling and assimilation of information, chart review, and discussion of plan.    Assessment &  Plan:  Donald [name] is a 64 y.o. male . Travel advice encounter  - CDC website reviewed, link given.   Need for vaccination - Plan: Tdap vaccine greater than or equal to 7yo IM  Vaccine counseling  -Unsure if second he is a vaccine, declined at this time.  He also will review home info regarding other routine immunizations such as MMR and hep B.  Need for malaria prophylaxis - Plan: mefloquine (LARIAM) 250 MG tablet  -CDC/yellow book reviewed.  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  Prescription given to start 2 weeks prior, continue weekly in [place], then continue 4 weeks upon return.  Needs flu shot - Plan: Flu Vaccine QUAD 36+ mos IM   Meds ordered this encounter  Medications  . mefloquine (LARIAM) 250 MG tablet    Sig: Take 1 tablet (250 mg total) by mouth every 7 (seven) days.    Dispense:  16 tablet    Refill:  0   Patient Instructions    CDC website for travel info: [employer] Flu vaccine:today  tdap today.  Start mefloquine 1 pill per week starting 2 weeks prior to travel, continue once per week when you are in [place], and continue for 4 weeks once you have returned. Immunization History  Administered Date(s) Administered  . Hepatitis A, Adult 04/28/2015  . Tdap 11/09/2020  you received typhoid vaccine in April 2019.  Have a safe trip.   If you have lab work done today you will be contacted with your lab results within the next 2 weeks.  If you have not heard from [place] then please contact us. The fastest way to get your results is to register for My Chart.   IF you received an x-ray today, you will receive an invoice from [facility]. Please contact [facility] at [phone] with questions or concerns regarding your invoice.   IF you received labwork today, you will receive an invoice from [place]. Please contact [facility]  at [phone] with questions or concerns regarding your invoice.   Our billing staff will not be able to assist you with questions regarding bills from these companies.  You will be contacted with the lab results as soon as they are available. The fastest way to get your results is to activate your My Chart account. Instructions are located on the last page of this paperwork. If you have not heard from [place] regarding the results in 2 weeks, please contact this office.         Signed, Merri Ray, MD [facility]

## 2020-11-09 NOTE — Patient Instructions (Addendum)
  CDC website for travel info: [employer] Flu vaccine:today  tdap today.  Start mefloquine 1 pill per week starting 2 weeks prior to travel, continue once per week when you are in [place], and continue for 4 weeks once you have returned. Immunization History  Administered Date(s) Administered  . Hepatitis A, Adult 04/28/2015  . Tdap 11/09/2020  you received typhoid vaccine in April 2019.  Have a safe trip.   If you have lab work done today you will be contacted with your lab results within the next 2 weeks.  If you have not heard from [place] then please contact us. The fastest way to get your results is to register for My Chart.   IF you received an x-ray today, you will receive an invoice from [facility]. Please contact [facility] at [phone] with questions or concerns regarding your invoice.   IF you received labwork today, you will receive an invoice from [place]. Please contact [facility] at [phone] with questions or concerns regarding your invoice.   Our billing staff will not be able to assist you with questions regarding bills from these companies.  You will be contacted with the lab results as soon as they are available. The fastest way to get your results is to activate your My Chart account. Instructions are located on the last page of this paperwork. If you have not heard from [place] regarding the results in 2 weeks, please contact this office.

## 2020-11-12 ENCOUNTER — Ambulatory Visit: Payer: No Typology Code available for payment source | Admitting: Neurology

## 2020-12-12 DIAGNOSIS — F0390 Unspecified dementia without behavioral disturbance: Secondary | ICD-10-CM | POA: Insufficient documentation

## 2021-03-29 ENCOUNTER — Encounter: Payer: Self-pay | Admitting: Gastroenterology

## 2021-05-11 ENCOUNTER — Inpatient Hospital Stay (HOSPITAL_COMMUNITY): Payer: No Typology Code available for payment source

## 2021-05-11 ENCOUNTER — Emergency Department (HOSPITAL_COMMUNITY): Payer: No Typology Code available for payment source

## 2021-05-11 ENCOUNTER — Encounter (HOSPITAL_COMMUNITY): Payer: Self-pay | Admitting: Radiology

## 2021-05-11 ENCOUNTER — Inpatient Hospital Stay (HOSPITAL_COMMUNITY)
Admission: EM | Admit: 2021-05-11 | Discharge: 2021-05-20 | DRG: 065 | Disposition: A | Discharge status: Rehabilitation Facility | Payer: No Typology Code available for payment source | Attending: Internal Medicine | Admitting: Internal Medicine

## 2021-05-11 DIAGNOSIS — R29717 NIHSS score 17: Secondary | ICD-10-CM | POA: Diagnosis present

## 2021-05-11 DIAGNOSIS — R413 Other amnesia: Secondary | ICD-10-CM

## 2021-05-11 DIAGNOSIS — I69154 Hemiplegia and hemiparesis following nontraumatic intracerebral hemorrhage affecting left non-dominant side: Secondary | ICD-10-CM | POA: Diagnosis not present

## 2021-05-11 DIAGNOSIS — G3184 Mild cognitive impairment, so stated: Secondary | ICD-10-CM | POA: Diagnosis present

## 2021-05-11 DIAGNOSIS — I612 Nontraumatic intracerebral hemorrhage in hemisphere, unspecified: Secondary | ICD-10-CM | POA: Diagnosis not present

## 2021-05-11 DIAGNOSIS — I161 Hypertensive emergency: Secondary | ICD-10-CM | POA: Diagnosis present

## 2021-05-11 DIAGNOSIS — I6389 Other cerebral infarction: Secondary | ICD-10-CM | POA: Diagnosis not present

## 2021-05-11 DIAGNOSIS — I608 Other nontraumatic subarachnoid hemorrhage: Secondary | ICD-10-CM | POA: Diagnosis present

## 2021-05-11 DIAGNOSIS — R29810 Facial weakness: Secondary | ICD-10-CM | POA: Diagnosis present

## 2021-05-11 DIAGNOSIS — R1310 Dysphagia, unspecified: Secondary | ICD-10-CM | POA: Diagnosis present

## 2021-05-11 DIAGNOSIS — I611 Nontraumatic intracerebral hemorrhage in hemisphere, cortical: Principal | ICD-10-CM | POA: Diagnosis present

## 2021-05-11 DIAGNOSIS — I629 Nontraumatic intracranial hemorrhage, unspecified: Secondary | ICD-10-CM | POA: Diagnosis not present

## 2021-05-11 DIAGNOSIS — D72829 Elevated white blood cell count, unspecified: Secondary | ICD-10-CM

## 2021-05-11 DIAGNOSIS — I69354 Hemiplegia and hemiparesis following cerebral infarction affecting left non-dominant side: Secondary | ICD-10-CM | POA: Insufficient documentation

## 2021-05-11 DIAGNOSIS — R569 Unspecified convulsions: Secondary | ICD-10-CM | POA: Diagnosis not present

## 2021-05-11 DIAGNOSIS — E871 Hypo-osmolality and hyponatremia: Secondary | ICD-10-CM | POA: Diagnosis not present

## 2021-05-11 DIAGNOSIS — Z20822 Contact with and (suspected) exposure to covid-19: Secondary | ICD-10-CM | POA: Diagnosis present

## 2021-05-11 DIAGNOSIS — R509 Fever, unspecified: Secondary | ICD-10-CM | POA: Diagnosis not present

## 2021-05-11 DIAGNOSIS — R471 Dysarthria and anarthria: Secondary | ICD-10-CM | POA: Diagnosis present

## 2021-05-11 DIAGNOSIS — E876 Hypokalemia: Secondary | ICD-10-CM

## 2021-05-11 DIAGNOSIS — M159 Polyosteoarthritis, unspecified: Secondary | ICD-10-CM | POA: Diagnosis not present

## 2021-05-11 DIAGNOSIS — Z8249 Family history of ischemic heart disease and other diseases of the circulatory system: Secondary | ICD-10-CM | POA: Diagnosis not present

## 2021-05-11 DIAGNOSIS — G9341 Metabolic encephalopathy: Secondary | ICD-10-CM | POA: Diagnosis not present

## 2021-05-11 DIAGNOSIS — F0280 Dementia in other diseases classified elsewhere without behavioral disturbance: Secondary | ICD-10-CM | POA: Diagnosis not present

## 2021-05-11 DIAGNOSIS — E785 Hyperlipidemia, unspecified: Secondary | ICD-10-CM | POA: Diagnosis present

## 2021-05-11 DIAGNOSIS — I618 Other nontraumatic intracerebral hemorrhage: Secondary | ICD-10-CM | POA: Diagnosis not present

## 2021-05-11 DIAGNOSIS — K5900 Constipation, unspecified: Secondary | ICD-10-CM | POA: Diagnosis present

## 2021-05-11 DIAGNOSIS — G8194 Hemiplegia, unspecified affecting left nondominant side: Secondary | ICD-10-CM | POA: Diagnosis present

## 2021-05-11 DIAGNOSIS — I69198 Other sequelae of nontraumatic intracerebral hemorrhage: Secondary | ICD-10-CM | POA: Diagnosis not present

## 2021-05-11 DIAGNOSIS — I10 Essential (primary) hypertension: Secondary | ICD-10-CM | POA: Diagnosis not present

## 2021-05-11 DIAGNOSIS — G4733 Obstructive sleep apnea (adult) (pediatric): Secondary | ICD-10-CM

## 2021-05-11 DIAGNOSIS — D72828 Other elevated white blood cell count: Secondary | ICD-10-CM | POA: Diagnosis not present

## 2021-05-11 DIAGNOSIS — E440 Moderate protein-calorie malnutrition: Secondary | ICD-10-CM | POA: Diagnosis not present

## 2021-05-11 DIAGNOSIS — R414 Neurologic neglect syndrome: Secondary | ICD-10-CM | POA: Diagnosis present

## 2021-05-11 DIAGNOSIS — I619 Nontraumatic intracerebral hemorrhage, unspecified: Secondary | ICD-10-CM

## 2021-05-11 DIAGNOSIS — R739 Hyperglycemia, unspecified: Secondary | ICD-10-CM | POA: Diagnosis not present

## 2021-05-11 DIAGNOSIS — G936 Cerebral edema: Secondary | ICD-10-CM | POA: Diagnosis not present

## 2021-05-11 DIAGNOSIS — G300 Alzheimer's disease with early onset: Secondary | ICD-10-CM | POA: Diagnosis not present

## 2021-05-11 LAB — COMPREHENSIVE METABOLIC PANEL
ALT: 17 U/L (ref 0–44)
AST: 23 U/L (ref 15–41)
Albumin: 3.9 g/dL (ref 3.5–5.0)
Alkaline Phosphatase: 94 U/L (ref 38–126)
Anion gap: 7 (ref 5–15)
BUN: 8 mg/dL (ref 8–23)
CO2: 22 mmol/L (ref 22–32)
Calcium: 8.9 mg/dL (ref 8.9–10.3)
Chloride: 110 mmol/L (ref 98–111)
Creatinine, Ser: 1.18 mg/dL (ref 0.61–1.24)
GFR, Estimated: >60 mL/min (ref >60)
Glucose, Bld: 156 mg/dL — ABNORMAL HIGH (ref 70–99)
Potassium: 3.5 mmol/L (ref 3.5–5.1)
Sodium: 139 mmol/L (ref 135–145)
Total Bilirubin: 0.7 mg/dL (ref 0.3–1.2)
Total Protein: 6.7 g/dL (ref 6.5–8.1)

## 2021-05-11 LAB — RAPID URINE DRUG SCREEN, HOSP PERFORMED
Amphetamines: NOT DETECTED
Barbiturates: NOT DETECTED
Benzodiazepines: NOT DETECTED
Cocaine: NOT DETECTED
Opiates: NOT DETECTED
Tetrahydrocannabinol: NOT DETECTED

## 2021-05-11 LAB — URINALYSIS, ROUTINE W REFLEX MICROSCOPIC
Bilirubin Urine: NEGATIVE
Glucose, UA: NEGATIVE mg/dL
Hgb urine dipstick: NEGATIVE
Ketones, ur: NEGATIVE mg/dL
Leukocytes,Ua: NEGATIVE
Nitrite: NEGATIVE
Protein, ur: NEGATIVE mg/dL
Specific Gravity, Urine: 1.026 (ref 1.005–1.030)
pH: 6 (ref 5.0–8.0)

## 2021-05-11 LAB — DIFFERENTIAL
Abs Immature Granulocytes: 0.01 10*3/uL (ref 0.00–0.07)
Basophils Absolute: 0.1 10*3/uL (ref 0.0–0.1)
Basophils Relative: 1 %
Eosinophils Absolute: 0.1 10*3/uL (ref 0.0–0.5)
Eosinophils Relative: 1 %
Immature Granulocytes: 0 %
Lymphocytes Relative: 67 %
Lymphs Abs: 5.1 10*3/uL — ABNORMAL HIGH (ref 0.7–4.0)
Monocytes Absolute: 0.6 10*3/uL (ref 0.1–1.0)
Monocytes Relative: 8 %
Neutro Abs: 1.7 10*3/uL (ref 1.7–7.7)
Neutrophils Relative %: 23 %

## 2021-05-11 LAB — LIPID PANEL
Cholesterol: 232 mg/dL — ABNORMAL HIGH (ref 0–200)
HDL: 77 mg/dL (ref >40)
LDL Cholesterol: 140 mg/dL — ABNORMAL HIGH (ref 0–99)
Total CHOL/HDL Ratio: 3 RATIO
Triglycerides: 73 mg/dL (ref <150)
VLDL: 15 mg/dL (ref 0–40)

## 2021-05-11 LAB — ECHOCARDIOGRAM COMPLETE
AR max vel: 2.64 cm2
AV Area VTI: 2.77 cm2
AV Area mean vel: 2.59 cm2
AV Mean grad: 3 mmHg
AV Peak grad: 6.7 mmHg
Ao pk vel: 1.29 m/s
Area-P 1/2: 3.6 cm2
S' Lateral: 2.8 cm
Weight: 2483.26 oz

## 2021-05-11 LAB — RESP PANEL BY RT-PCR (FLU A&B, COVID) ARPGX2
Influenza A by PCR: NEGATIVE
Influenza B by PCR: NEGATIVE
SARS Coronavirus 2 by RT PCR: NEGATIVE

## 2021-05-11 LAB — MRSA PCR SCREENING: MRSA by PCR: NEGATIVE

## 2021-05-11 LAB — I-STAT CHEM 8, ED
BUN: 11 mg/dL (ref 8–23)
Calcium, Ion: 1.14 mmol/L — ABNORMAL LOW (ref 1.15–1.40)
Chloride: 107 mmol/L (ref 98–111)
Creatinine, Ser: 1.1 mg/dL (ref 0.61–1.24)
Glucose, Bld: 154 mg/dL — ABNORMAL HIGH (ref 70–99)
HCT: 44 % (ref 39.0–52.0)
Hemoglobin: 15 g/dL (ref 13.0–17.0)
Potassium: 3.5 mmol/L (ref 3.5–5.1)
Sodium: 142 mmol/L (ref 135–145)
TCO2: 23 mmol/L (ref 22–32)

## 2021-05-11 LAB — HIV ANTIBODY (ROUTINE TESTING W REFLEX): HIV Screen 4th Generation wRfx: NONREACTIVE

## 2021-05-11 LAB — ETHANOL: Alcohol, Ethyl (B): <10 mg/dL (ref <10)

## 2021-05-11 LAB — CBC
HCT: 45.5 % (ref 39.0–52.0)
Hemoglobin: 14.8 g/dL (ref 13.0–17.0)
MCH: 29.7 pg (ref 26.0–34.0)
MCHC: 32.5 g/dL (ref 30.0–36.0)
MCV: 91.4 fL (ref 80.0–100.0)
Platelets: 257 10*3/uL (ref 150–400)
RBC: 4.98 MIL/uL (ref 4.22–5.81)
RDW: 13.2 % (ref 11.5–15.5)
WBC: 7.6 10*3/uL (ref 4.0–10.5)
nRBC: 0 % (ref 0.0–0.2)

## 2021-05-11 LAB — APTT: aPTT: 26 seconds (ref 24–36)

## 2021-05-11 LAB — PROTIME-INR
INR: 1 (ref 0.8–1.2)
Prothrombin Time: 13.4 seconds (ref 11.4–15.2)

## 2021-05-11 LAB — HEMOGLOBIN A1C
Hgb A1c MFr Bld: 5.8 % — ABNORMAL HIGH (ref 4.8–5.6)
Mean Plasma Glucose: 120 mg/dL

## 2021-05-11 LAB — ABO/RH: ABO/RH(D): B POS

## 2021-05-11 LAB — TYPE AND SCREEN
ABO/RH(D): B POS
Antibody Screen: NEGATIVE

## 2021-05-11 LAB — CBG MONITORING, ED: Glucose-Capillary: 162 mg/dL — ABNORMAL HIGH (ref 70–99)

## 2021-05-11 MED ORDER — STROKE: EARLY STAGES OF RECOVERY BOOK: Freq: Once | Status: AC

## 2021-05-11 MED ORDER — SENNOSIDES-DOCUSATE SODIUM 8.6-50 MG PO TABS
1.0000 | ORAL_TABLET | Freq: Two times a day (BID) | ORAL | Status: DC
  Administered 2021-05-13 – 2021-05-20 (×15): 1 via ORAL
  Filled 2021-05-11 (×15): qty 1

## 2021-05-11 MED ORDER — LEVETIRACETAM IN NACL 1000 MG/100ML IV SOLN
1000.0000 mg | Freq: Two times a day (BID) | INTRAVENOUS | Status: DC
  Administered 2021-05-11: 1000 mg via INTRAVENOUS
  Filled 2021-05-11: qty 100

## 2021-05-11 MED ORDER — ACETAMINOPHEN 10 MG/ML IV SOLN
1000.0000 mg | Freq: Once | INTRAVENOUS | Status: AC
  Administered 2021-05-11: 1000 mg via INTRAVENOUS
  Filled 2021-05-11: qty 100

## 2021-05-11 MED ORDER — PANTOPRAZOLE SODIUM 40 MG IV SOLR
40.0000 mg | Freq: Every day | INTRAVENOUS | Status: DC
Start: 2021-05-11 — End: 2021-05-12
  Administered 2021-05-11: 40 mg via INTRAVENOUS
  Filled 2021-05-11: qty 40

## 2021-05-11 MED ORDER — LABETALOL HCL 5 MG/ML IV SOLN
INTRAVENOUS | Status: AC
  Administered 2021-05-11: 10 mg
  Filled 2021-05-11: qty 4

## 2021-05-11 MED ORDER — ACETAMINOPHEN 325 MG PO TABS
650.0000 mg | ORAL_TABLET | ORAL | Status: DC | PRN
  Administered 2021-05-13 – 2021-05-17 (×5): 650 mg via ORAL
  Filled 2021-05-11 (×5): qty 2

## 2021-05-11 MED ORDER — ORAL CARE MOUTH RINSE
15.0000 mL | Freq: Two times a day (BID) | OROMUCOSAL | Status: DC
  Administered 2021-05-11 – 2021-05-20 (×16): 15 mL via OROMUCOSAL

## 2021-05-11 MED ORDER — CLEVIDIPINE BUTYRATE 0.5 MG/ML IV EMUL
0.0000 mg/h | INTRAVENOUS | Status: DC | PRN
  Administered 2021-05-11 (×2): 2 mg/h via INTRAVENOUS
  Administered 2021-05-12: 5 mg/h via INTRAVENOUS
  Filled 2021-05-11 (×2): qty 50

## 2021-05-11 MED ORDER — LEVETIRACETAM IN NACL 500 MG/100ML IV SOLN
500.0000 mg | Freq: Two times a day (BID) | INTRAVENOUS | Status: DC
  Administered 2021-05-11: 500 mg via INTRAVENOUS
  Filled 2021-05-11: qty 100

## 2021-05-11 MED ORDER — ACETAMINOPHEN 650 MG RE SUPP
650.0000 mg | RECTAL | Status: DC | PRN
  Administered 2021-05-11: 650 mg via RECTAL
  Filled 2021-05-11: qty 1

## 2021-05-11 MED ORDER — LABETALOL HCL 5 MG/ML IV SOLN
10.0000 mg | Freq: Once | INTRAVENOUS | Status: AC
Start: 2021-05-11 — End: 2021-05-11
  Administered 2021-05-11: 10 mg via INTRAVENOUS
  Filled 2021-05-11: qty 4

## 2021-05-11 MED ORDER — SODIUM CHLORIDE 0.9 % IV SOLN
INTRAVENOUS | Status: DC | PRN
  Administered 2021-05-11: 1000 mL via INTRAVENOUS

## 2021-05-11 MED ORDER — SODIUM CHLORIDE 0.9% FLUSH: 3.0000 mL | Freq: Once | INTRAVENOUS | Status: DC

## 2021-05-11 MED ORDER — CHLORHEXIDINE GLUCONATE CLOTH 2 % EX PADS
6.0000 | MEDICATED_PAD | Freq: Every day | CUTANEOUS | Status: DC
  Administered 2021-05-11 – 2021-05-20 (×10): 6 via TOPICAL

## 2021-05-11 MED ORDER — ACETAMINOPHEN 160 MG/5ML PO SOLN
650.0000 mg | ORAL | Status: DC | PRN
  Administered 2021-05-16: 650 mg
  Filled 2021-05-11: qty 20.3

## 2021-05-11 MED ORDER — IOHEXOL 350 MG/ML SOLN
50.0000 mL | Freq: Once | INTRAVENOUS | Status: AC | PRN
  Administered 2021-05-11: 50 mL via INTRAVENOUS

## 2021-05-11 MED ORDER — LABETALOL HCL 5 MG/ML IV SOLN: 10.0000 mg | Freq: Once | INTRAVENOUS | Status: DC

## 2021-05-11 NOTE — ED Provider Notes (Addendum)
[facility] Note   CSN: [id number] Arrival date & time: 05/11/21  [id number]  LEVEL 5 CAVEAT - ACUITY OF CONDITION History Chief Complaint  Patient presents with  . Code Stroke    Donald [name] is a 65 y.o. male.  HPI 65 year old male presents with acute code stroke.  History is primarily from EMS.  Last known well at [id number] after he and his wife had finished intercourse.  All of a sudden has developed left-sided weakness and right-sided gaze.  He has had nausea.  Past Medical History:  Diagnosis Date  . Arthritis     Patient Active Problem List   Diagnosis Date Noted  . ICH (intracerebral hemorrhage) (HCC) 05/11/2021  . Annual physical exam 04/03/2017    Past Surgical History:  Procedure Laterality Date  . APPENDECTOMY         Family History  Problem Relation Age of Onset  . Hypertension Mother   . Colon polyps Neg Hx   . Colon cancer Neg Hx   . Esophageal cancer Neg Hx   . Rectal cancer Neg Hx   . Stomach cancer Neg Hx     Social History   Tobacco Use  . Smoking status: Never Smoker  . Smokeless tobacco: Never Used  Substance Use Topics  . Alcohol use: No  . Drug use: No    Home Medications Prior to Admission medications   Medication Sig Start Date End Date Taking? Authorizing Provider  Multiple Vitamins-Minerals (MENS 50+ MULTI VITAMIN/MIN PO) Take 1 Dose by mouth in the morning and at bedtime.   Yes [provider]  mefloquine (LARIAM) 250 MG tablet Take 1 tablet (250 mg total) by mouth every 7 (seven) days. Patient not taking: No sig reported 11/09/20   Shade FloodGreene, Jeffrey R, MD    Allergies    Sulfa antibiotics  Review of Systems   Review of Systems  Unable to perform ROS: Acuity of condition    Physical Exam Updated Vital Signs BP 113/74   Pulse 66   Temp (!) 97.4 F (36.3 C) (Axillary)   Resp (!) 26   Wt 70.4 kg   SpO2 100%   BMI 23.60 kg/m   Physical Exam Vitals and nursing  note reviewed.  Constitutional:      Appearance: He is well-developed.  HENT:     Head: Normocephalic and atraumatic.     Right Ear: External ear normal.     Left Ear: External ear normal.     Nose: Nose normal.  Eyes:     General:        Right eye: No discharge.        Left eye: No discharge.     Comments: Rightward gaze  Cardiovascular:     Rate and Rhythm: Normal rate and regular rhythm.     Heart sounds: Normal heart sounds.  Pulmonary:     Effort: Pulmonary effort is normal.     Breath sounds: Normal breath sounds.  Abdominal:     Palpations: Abdomen is soft.     Tenderness: There is no abdominal tenderness.  Musculoskeletal:     Cervical back: Neck supple.  Skin:    General: Skin is warm and dry.  Neurological:     Mental Status: He is alert.     Comments: Able to speak and identify objects. Follows commands. No right sided weakness. Cannot keep LLE up against gravity. LUE also can't be held vs gravity  Psychiatric:  Mood and Affect: Mood is not anxious.     ED Results / Procedures / Treatments   Labs (all labs ordered are listed, but only abnormal results are displayed) Labs Reviewed  DIFFERENTIAL - Abnormal; Notable for the following components:      Result Value   Lymphs Abs 5.1 (*)    All other components within normal limits  COMPREHENSIVE METABOLIC PANEL - Abnormal; Notable for the following components:   Glucose, Bld 156 (*)    All other components within normal limits  LIPID PANEL - Abnormal; Notable for the following components:   Cholesterol 232 (*)    LDL Cholesterol 140 (*)    All other components within normal limits  I-STAT CHEM 8, ED - Abnormal; Notable for the following components:   Glucose, Bld 154 (*)    Calcium, Ion 1.14 (*)    All other components within normal limits  CBG MONITORING, ED - Abnormal; Notable for the following components:   Glucose-Capillary 162 (*)    All other components within normal limits  RESP PANEL BY  RT-PCR (FLU A&B, COVID) ARPGX2  MRSA PCR SCREENING  PROTIME-INR  APTT  CBC  HIV ANTIBODY (ROUTINE TESTING W REFLEX)  ETHANOL  URINALYSIS, ROUTINE W REFLEX MICROSCOPIC  RAPID URINE DRUG SCREEN, HOSP PERFORMED  HEMOGLOBIN A1C  TYPE AND SCREEN  ABO/RH    EKG EKG Interpretation  Date/Time:  Tuesday May 11 2021 09:32:19 EDT Ventricular Rate:  64 PR Interval:  153 QRS Duration: 113 QT Interval:  422 QTC Calculation: 436 R Axis:   -69 Text Interpretation: Sinus rhythm LAD, consider left anterior fascicular block no acute ST/T changes similar to 2009 Confirmed by Pricilla Loveless [phone]) on 05/11/2021 10:53:13 AM   Radiology CT HEAD CODE STROKE WO CONTRAST  Result Date: 05/11/2021 CLINICAL DATA:  Code stroke. Left-sided weakness, nausea, and vomiting. EXAM: CT HEAD WITHOUT CONTRAST TECHNIQUE: Contiguous axial images were obtained from the base of the skull through the vertex without intravenous contrast. COMPARISON:  None. FINDINGS: Brain: An acute parenchymal hemorrhage in the high posterior right frontal lobe measures 4.2 x 2.3 x 3.1 cm (approximate volume of 15 mL). There is mild surrounding edema without midline shift, and there is a small amount of adjacent subarachnoid hemorrhage. A very small amount of coexistent subdural hemorrhage is also not excluded in this region. No acute infarct is identified elsewhere. The ventricles are normal in size. Hypodensities in the cerebral white matter bilaterally are nonspecific but compatible with mild chronic small vessel ischemic disease. Vascular: No hyperdense vessel. Skull: No fracture or suspicious osseous lesion. Sinuses/Orbits: Small osteoma in the left frontal sinus. Mild right and moderate left maxillary sinus mucosal thickening. Clear mastoid air cells. Unremarkable orbits. Other: None. ASPECTS [facility] Stroke Program Early CT Score) Not scored in the presence of acute hemorrhage. IMPRESSION: Acute parenchymal hemorrhage in the posterior  right frontal lobe with small amount of adjacent subarachnoid hemorrhage. These results were communicated to Dr. Iver Nestle at 9:08 am on 05/11/2021 by text page via the [facility] messaging system. Electronically Signed   By: Sebastian Ache M.D.   On: 05/11/2021 09:12   CT ANGIO HEAD NECK W WO CM (CODE STROKE)  Result Date: 05/11/2021 CLINICAL DATA:  Stroke follow-up.  Left-sided weakness. EXAM: CT ANGIOGRAPHY HEAD AND NECK TECHNIQUE: Multidetector CT imaging of the head and neck was performed using the standard protocol during bolus administration of intravenous contrast. Multiplanar CT image reconstructions and MIPs were obtained to evaluate the vascular anatomy. Carotid stenosis  measurements (when applicable) are obtained utilizing NASCET criteria, using the distal internal carotid diameter as the denominator. CONTRAST:  36mL OMNIPAQUE IOHEXOL 350 MG/ML SOLN COMPARISON:  Same day CT head. FINDINGS: CTA NECK FINDINGS Aortic arch: Great vessel origins are patent. Right carotid system: No evidence of dissection, stenosis (50% or greater) or occlusion. Mild apparent irregularity of the ICA at the skull base in a region of streak artifact. Left carotid system: No evidence of dissection, stenosis (50% or greater) or occlusion. Mild apparent irregularity of the ICA at the skull base in a region of streak artifact. Vertebral arteries: Codominant. No evidence of dissection, stenosis (50% or greater) or occlusion. Skeleton: Moderate degenerative disc disease at at C5-C6 and C6-C7 with disc height loss, endplate sclerosis and posterior disc osteophyte complexes. Other neck: No acute abnormality Upper chest: Visualized lung apices are clear. Review of the MIP images confirms the above findings CTA HEAD FINDINGS Anterior circulation: No large vessel occlusion or proximal hemodynamically significant stenosis. Evaluation of the distal vasculature is limited due to venous contamination. No evidence of and arteriovenous malformation or  aneurysm in the region of intraparenchymal hemorrhage, although acute blood products limit evaluation. Posterior circulation: No large vessel occlusion or proximal hemodynamically significant stenosis. Venous sinuses: The superior sagittal sinus is narrowed in the region of hemorrhage, most likely from mass effect. Small right transverse and sigmoid sinuses. Review of the MIP images confirms the above findings IMPRESSION: CTA Head: 1. No large vessel occlusion or hemodynamically significant proximal arterial stenosis. 2. The superior sagittal sinus is narrowed in the region of hemorrhage with poorly visualized draining cortical veins in this region. While indeterminate, this may be secondary to mass effect given no definite/clear intraluminal thrombus. Given these findings and the location of hemorrhage; however, recommend low threshold for follow-up CTV or MRI with contrast to ensure stability and exclude worsening thrombosis. 3. No evidence of and arteriovenous malformation or aneurysm in the region of intraparenchymal hemorrhage, although acute blood products limit evaluation. Follow-up imaging after resolution of hemorrhage could provide further assessment if clinically indicated. CTA Neck: 1. No significant (greater than 50%) stenosis. 2. Mild apparent irregularity of bilateral ICAs at the skull base is favored artifactual given streak artifact from dental amalgam in this region. Fibromuscular dysplasia is a differential consideration. Findings and recommendations discussed with Dr. Iver Nestle via telephone at 9:59 a.m. Electronically Signed   By: Feliberto Harts MD   On: 05/11/2021 10:09    Procedures .Critical Care Performed by: Pricilla Loveless, MD Authorized by: Pricilla Loveless, MD   Critical care provider statement:    Critical care time (minutes):  30   Critical care time was exclusive of:  Separately billable procedures and treating other patients   Critical care was necessary to treat or prevent  imminent or life-threatening deterioration of the following conditions:  CNS failure or compromise   Critical care was time spent personally by me on the following activities:  Discussions with consultants, evaluation of patient's response to treatment, examination of patient, ordering and performing treatments and interventions, ordering and review of laboratory studies, ordering and review of radiographic studies, pulse oximetry, re-evaluation of patient's condition, obtaining history from patient or surrogate and review of old charts     Medications Ordered in ED Medications  sodium chloride flush (NS) 0.9 % injection 3 mL (has no administration in time range)  labetalol (NORMODYNE) injection 10 mg (10 mg Intravenous Not Given 05/11/21 0925)  acetaminophen (TYLENOL) tablet 650 mg (has no administration in time  range)    Or  acetaminophen (TYLENOL) 160 MG/5ML solution 650 mg (has no administration in time range)    Or  acetaminophen (TYLENOL) suppository 650 mg (has no administration in time range)  senna-docusate (Senokot-S) tablet 1 tablet (1 tablet Oral Not Given 05/11/21 1309)  pantoprazole (PROTONIX) injection 40 mg (has no administration in time range)  clevidipine (CLEVIPREX) infusion 0.5 mg/mL (2 mg/hr Intravenous New Bag/Given 05/11/21 1110)  levETIRAcetam (KEPPRA) IVPB 500 mg/100 mL premix (has no administration in time range)  Chlorhexidine Gluconate Cloth 2 % PADS 6 each (6 each Topical Given 05/11/21 1309)  0.9 %  sodium chloride infusion (1,000 mLs Intravenous New Bag/Given 05/11/21 1111)  iohexol (OMNIPAQUE) 350 MG/ML injection 50 mL (50 mLs Intravenous Contrast Given 05/11/21 0914)  labetalol (NORMODYNE) 5 MG/ML injection (10 mg  Given 05/11/21 9407)   stroke: mapping our early stages of recovery book ( Does not apply Given 05/11/21 1308)  labetalol (NORMODYNE) injection 10 mg (10 mg Intravenous Given 05/11/21 1048)  acetaminophen (OFIRMEV) IV 1,000 mg (1,000 mg Intravenous New  Bag/Given 05/11/21 1504)    ED Course  I have reviewed the triage vital signs and the nursing notes.  Pertinent labs & imaging results that were available during my care of the patient were reviewed by me and considered in my medical decision making (see chart for details).    MDM Rules/Calculators/A&P                          Head CT shows right-sided bleed.  Fortunately his blood pressure is okay.  He is protecting his airway.  CTA obtained, no emergent intervention needed. Will admit to neuro ICU. Final Clinical Impression(s) / ED Diagnoses Final diagnoses:  Intraparenchymal hemorrhage of brain [facility])    Rx / DC Orders ED Discharge Orders    None       Pricilla Loveless, MD 05/11/21 1535    Pricilla Loveless, MD 05/11/21 1535

## 2021-05-11 NOTE — Code Documentation (Signed)
Pt is a 65 yr old male with a history of some memory impairment and OSA who had a witnessed onset of left sided weakness and rt gaze at 0745 this am.EMS was activated at that time. Pt arrived to [facility] at 0848. He was cleared at bridge by EDP, and labs and CT were drawn. Pt was taken urgently to CT scanner at 0850. Per neurologist, CTH positive for Rt intracerebral hemorrhage. CTA was performed as well. Pt then returned to ED room 32. Labetelol 10 mg was given for SBP>140. He was loaded with Keppra. Further history was obtained from wife by NP at bedside. 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 Please see timeline for full NIHSS and times. Pt will need q 1 hr VS and mNIHSS as well as pupil checks.BP will need to be kept <140/90.Bedside handoff with Donald [name]. Bed requested on 4 [place].

## 2021-05-11 NOTE — Progress Notes (Signed)
  Echocardiogram 2D Echocardiogram has been performed.  Donald [name] F 05/11/2021, 3:01 PM

## 2021-05-11 NOTE — ED Triage Notes (Signed)
Pt arrived from home by EMS. Sudden changed in mentation and left sided weakness noted by wife at 66  Code Stroke Called

## 2021-05-11 NOTE — Evaluation (Addendum)
Clinical/Bedside Swallow Evaluation Patient Details  Name: [name] MRN: [id number] Date of Birth: 03/15/1956  Today's Date: 05/11/2021 Time: SLP Start Time (ACUTE ONLY): 1500 SLP Stop Time (ACUTE ONLY): 1511 SLP Time Calculation (min) (ACUTE ONLY): 11 min  Past Medical History:  Past Medical History:  Diagnosis Date  . Arthritis    Past Surgical History:  Past Surgical History:  Procedure Laterality Date  . APPENDECTOMY     HPI:  Donald [name] is a 65 y.o. male with a medical history significant for dementia with behavioral disturbance, OSA, arthritis who presented to the ED via EMS for evaluation of acute onset of left hemiparesis, right gaze, and left mouth droop. CT acute parenchymal hemorrhage in the posterior right frontal lobe with small amount of adjacent subarachnoid hemorrhage. Per chart documentation at baseline pt cares for himself with some short term memory deficits and had a scheduled lumbar puncture today for outpatient evaluation of possible Alzheimer's Dementia at [facility]. Failed [facility] screen due to facial droop and significant leakage.   Assessment / Plan / Recommendation Clinical Impression   Pt demonstrates CN VII impairments which led to left asymmetry, decreased ROM and CN XII with reduced movement to left. He is sleepy and awake for assessment but not for comsumption of longer than 1-2 minutes. Left labial loss with thin and decreased manipulation with pudding. After pudding he stated "no more sweets, it will make get sick". He retched about 4 times (no emesis), cued to take deep breaths. SLP lowered head and remained present to ensure no emesis. He did not cough or other indications of aspiration today and should continue NPO status due to level of alertness. Tomorrow he pt should be able to initiate po's or participate in instrumental study. SLP Visit Diagnosis: Dysphagia, unspecified (R13.10)    Aspiration Risk  Mild aspiration risk    Diet Recommendation  NPO   Medication Administration: Via alternative means    Other  Recommendations Oral Care Recommendations: Oral care QID   Follow up Recommendations Inpatient Rehab      Frequency and Duration min 2x/week  2 weeks       Prognosis        Swallow Study   General HPI: Donald [name] is a 65 y.o. male with a medical history significant for dementia with behavioral disturbance, OSA, arthritis who presented to the ED via EMS for evaluation of acute onset of left hemiparesis, right gaze, and left mouth droop. CT acute parenchymal hemorrhage in the posterior right frontal lobe with small amount of adjacent subarachnoid hemorrhage. Per chart documentation at baseline pt cares for himself with some short term memory deficits and had a scheduled lumbar puncture today for outpatient evaluation of possible Alzheimer's Dementia at [facility]. Failed [facility] screen due to facial droop and significant leakage. Temperature Spikes Noted: No Respiratory Status: Room air History of Recent Intubation: No Behavior/Cognition: Lethargic/Drowsy;Requires cueing;Cooperative Oral Cavity Assessment: Within Functional Limits Oral Care Completed by SLP: No Oral Cavity - Dentition: Adequate natural dentition Vision: Impaired for self-feeding (left neglect) Self-Feeding Abilities: Needs assist Patient Positioning: Upright in bed Baseline Vocal Quality: Low vocal intensity Volitional Cough: Weak    Oral/Motor/Sensory Function Overall Oral Motor/Sensory Function: Moderate impairment Facial ROM: Reduced left;Suspected CN VII (facial) dysfunction Facial Symmetry: Abnormal symmetry left;Suspected CN VII (facial) dysfunction Facial Strength: Reduced left;Suspected CN VII (facial) dysfunction Lingual ROM: Reduced left;Suspected CN XII (hypoglossal) dysfunction Lingual Symmetry: Within Functional Limits Lingual Strength: Reduced;Suspected CN XII (hypoglossal) dysfunction Velum: Within Functional Limits Mandible: Within  Functional Limits   Ice Chips Ice chips: Not tested   Thin Liquid Thin Liquid: Impaired Presentation: Cup;Straw Oral Phase Impairments: Reduced labial seal Oral Phase Functional Implications: Left anterior spillage Pharyngeal  Phase Impairments:  (no overt)    Nectar Thick Nectar Thick Liquid: Not tested   Honey Thick Honey Thick Liquid: Not tested   Puree Puree: Impaired Presentation: Spoon Oral Phase Impairments: Reduced lingual movement/coordination Pharyngeal Phase Impairments:  (none)   Solid     Solid: Not tested      [name] 05/11/2021,3:33 PM  [name] [name].Ed Nurse, children's [phone] Office [phone]

## 2021-05-11 NOTE — H&P (Signed)
Neurology History and Physical  Reason for Consult: Right gaze, left mouth droop, left hemiparesis Referring Physician: Dr. Criss Alvine  CC: Left hemiparesis and neck pain  History is obtained from: EMS, Patient, Patient's wife at bedside  HPI: Donald [name] is a 65 y.o. male with a medical history significant for dementia with behavioral disturbance and reported OSA not on CPAP per wife who presented to the ED via EMS for evaluation of acute onset of left hemiparesis, right gaze, and left mouth droop. Donald [name] had woken up in his normal state of health this morning. He and his wife had intercourse this morning and when she got up to get ready for the day, she noticed that Donald [name] was acting "off". She asked him to get out of bed and he stated that he was unable to move his left side and she noticed that he was looking towards the right so she activated EMS. His initial manual blood pressure at 08:15 was 194/104 and on arrival to the hospital around 09:00 his blood pressure was 122/74. On arrival, Donald [name] was noted to have persistent left hemiparesis, right gaze, neck pain, left mouth droop, and decreased sensation on the left and was immediately taken to CT for stroke evaluation.  As baseline Donald [name] is able to care for himself at home but has some short term memory deficits and had a scheduled lumbar puncture today for outpatient evaluation of possible Alzheimer's Dementia at [facility]. She also states that he has OSA but does not need CPAP and has an appointment for a mouthguard fitting in the near future. He does not take any medications at home.   LKW: 07:45 tpa given?: No, ICH IR Thrombectomy? No, ICH ICH Score: 0 Modified Rankin Scale: 1-No significant post stroke disability and can perform usual duties with stroke symptoms   NIHSS: 1a Level of Conscious.: 0 1b LOC Questions: 2 1c LOC Commands: 0 2 Best Gaze: 2, unable to cross midline 3 Visual: 0 4 Facial Palsy: 2; left  mouth droop 5a Motor Arm - left: 4 no movement of LUE 5b Motor Arm - Right: 0 6a Motor Leg - Left: 3, minimal withdrawal LLE to noxious stimuli 6b Motor Leg - Right: 1 7 Limb Ataxia: 0 8 Sensory: 1; slightly decreased sensation to light touch on the left  9 Best Language: 0 10 Dysarthria: 1 11 Extinct. and Inatten.: 1 TOTAL: 17  ROS: A complete ROS was performed and is negative except as noted in the HPI.   Past Medical History:  Diagnosis Date  . Arthritis   Reported PMH of dementia with behavioral disturbance and OSA not on CPAP  Past Surgical History:  Procedure Laterality Date  . APPENDECTOMY     Family History  Problem Relation Age of Onset  . Hypertension Mother   . Colon polyps Neg Hx   . Colon cancer Neg Hx   . Esophageal cancer Neg Hx   . Rectal cancer Neg Hx   . Stomach cancer Neg Hx    Social History:   reports that he has never smoked. He has never used smokeless tobacco. He reports that he does not drink alcohol and does not use drugs.  Medications  Current Facility-Administered Medications:  .   stroke: mapping our early stages of recovery book, , Does not apply, Once, Toberman, Stevi W, NP .  acetaminophen (TYLENOL) tablet 650 mg, 650 mg, Oral, Q4H PRN **OR** acetaminophen (TYLENOL) 160 MG/5ML solution 650 mg, 650 mg, Per Tube, Q4H  PRN **OR** acetaminophen (TYLENOL) suppository 650 mg, 650 mg, Rectal, Q4H PRN, Kara Mead, NP .  clevidipine (CLEVIPREX) infusion 0.5 mg/mL, 0-21 mg/hr, Intravenous, PRN, Kara Mead, NP .  labetalol (NORMODYNE) injection 10 mg, 10 mg, Intravenous, Once, Toberman, Stevi W, NP .  levETIRAcetam (KEPPRA) IVPB 1000 mg/100 mL premix, 1,000 mg, Intravenous, Q12H, Kara Mead, NP, Last Rate: 400 mL/hr at 05/11/21 0923, 1,000 mg at 05/11/21 0923 .  levETIRAcetam (KEPPRA) IVPB 500 mg/100 mL premix, 500 mg, Intravenous, Q12H, Toberman, Stevi W, NP .  pantoprazole (PROTONIX) injection 40 mg, 40 mg, Intravenous, QHS,  Toberman, Stevi W, NP .  senna-docusate (Senokot-S) tablet 1 tablet, 1 tablet, Oral, BID, Toberman, Stevi W, NP .  sodium chloride flush (NS) 0.9 % injection 3 mL, 3 mL, Intravenous, Once, Pricilla Loveless, MD  Current Outpatient Medications:  .  Fish Oil OIL, by Does not apply route., Disp: , Rfl:  .  mefloquine (LARIAM) 250 MG tablet, Take 1 tablet (250 mg total) by mouth every 7 (seven) days., Disp: 16 tablet, Rfl: 0 .  Multiple Vitamins-Minerals (MENS 50+ MULTI VITAMIN/MIN PO), Take by mouth., Disp: , Rfl:  .  OVER THE COUNTER MEDICATION, Mind works daily, Disp: , Rfl:   Exam: Current vital signs: BP (!) 141/88   Pulse 65   Resp 16   Wt 70.4 kg   BMI 23.60 kg/m  Vital signs in last 24 hours: Pulse Rate:  [65-74] 65 (05/31 0945) Resp:  [16-22] 16 (05/31 0945) BP: (137-143)/(86-96) 141/88 (05/31 0945) Weight:  [70.4 kg] 70.4 kg (05/31 0800)  GENERAL: Awake, alert, in no acute distress Psych: Affect appropriate for situation, patient is calm and cooperative with examination Head: Normocephalic and atraumatic, dry mm EENT: Normal conjunctivae, no OP obstruction, forced closure of right eye during examination LUNGS: Normal respiratory effort. Non-labored breathing CV: Regular rate to bradycardia on telemetry monitor. Extremities warm without edema.  ABDOMEN - Soft, non-tender Ext: well perfused, without obvious deformity  NEURO:  Mental Status: Awake, alert, and oriented to self, age, year, and location. He is unable to correctly recall the month initially incorrectly stating that the month is April. When asked again he states that he cannot remember the current month.  Speech is mildly dysarthric and hypophonic. Naming, repetition, and comprehension are intact.  Cranial Nerves:  II: PERRL 3 mm --> 1.5 mm / brisk. Visual fields full.  III, IV, VI: Gaze deviation to the right, initially able to cross midline but on further assessment, eyes able to reach midline but unable to cross  towards the left. Right eye with minimal passive eye opening due to sensitivity.  V: Sensation is intact to light touch and symmetrical to face.  VII: Initial complete left facial droop involving upper and lower face improved to mild left mouth droop.    VIII: Hearing is intact to voice IX, X: Palate elevation is symmetric. Hypophonic speech.  XI: Shoulder shrug present on the right but absent on the left.  XII: Tongue protrudes midline without fasciculations.   Motor: Right upper extremity with 5/5 strength without pronator drift. Left upper extremity 0/5 without movement.  Right lower extremity 4/5 with antigravity movement but with vertical drift on assessment.  Left lower extremity 1/5 strength with minimal withdrawal to noxious stimuli but without spontaneous movement or movement to command. Bulk is normal. Tone is normal on the right upper and lower extremities but increased on the left upper and lower extremities. Sensation: Reported decreased sensation to light  touch on the left upper and lower extremities compared to the right Coordination: Unable to assess FNF and HKS on the left due to significant weakness.  DTRs: 2+ and symmetric patellae, biceps, and brachioradialis.  Plantars: Left: mute, right: downgoing Gait: Deferred  Labs I have reviewed labs in epic and the results pertinent to this consultation are: CBC    Component Value Date/Time   WBC 7.6 05/11/2021 0851   RBC 4.98 05/11/2021 0851   HGB 15.0 05/11/2021 0901   HGB 16.1 09/11/2020 1449   HCT 44.0 05/11/2021 0901   HCT 48.6 09/11/2020 1449   PLT 257 05/11/2021 0851   PLT 234 04/03/2017 0923   MCV 91.4 05/11/2021 0851   MCV 91 09/11/2020 1449   MCH 29.7 05/11/2021 0851   MCHC 32.5 05/11/2021 0851   RDW 13.2 05/11/2021 0851   RDW 13.1 09/11/2020 1449   LYMPHSABS 5.1 (H) 05/11/2021 0851   LYMPHSABS 2.1 09/11/2020 1449   MONOABS 0.6 05/11/2021 0851   EOSABS 0.1 05/11/2021 0851   EOSABS 0.0 09/11/2020 1449    BASOSABS 0.1 05/11/2021 0851   BASOSABS 0.0 09/11/2020 1449   CMP     Component Value Date/Time   NA 142 05/11/2021 0901   NA 143 09/11/2020 1449   K 3.5 05/11/2021 0901   CL 107 05/11/2021 0901   CO2 22 05/11/2021 0851   GLUCOSE 154 (H) 05/11/2021 0901   BUN 11 05/11/2021 0901   BUN 10 09/11/2020 1449   CREATININE 1.10 05/11/2021 0901   CREATININE 1.37 (H) 04/29/2020 0826   CALCIUM 8.9 05/11/2021 0851   PROT 6.7 05/11/2021 0851   PROT 7.6 09/11/2020 1449   ALBUMIN 3.9 05/11/2021 0851   ALBUMIN 4.8 09/11/2020 1449   AST 23 05/11/2021 0851   ALT 17 05/11/2021 0851   ALKPHOS 94 05/11/2021 0851   BILITOT 0.7 05/11/2021 0851   BILITOT 0.5 09/11/2020 1449   GFRNONAA >60 05/11/2021 0851   GFRNONAA 55 (L) 04/29/2020 0826   GFRAA 76 09/11/2020 1449   GFRAA 63 04/29/2020 0826   Lipid Panel     Component Value Date/Time   CHOL 204 (H) 09/11/2020 1449   TRIG 50 09/11/2020 1449   HDL 63 09/11/2020 1449   CHOLHDL 3.2 09/11/2020 1449   CHOLHDL 3.0 04/29/2020 0826   VLDL 14 04/15/2016 1013   LDLCALC 132 (H) 09/11/2020 1449   LDLCALC 107 (H) 04/29/2020 0826   No results found for: HGBA1C  Imaging I have reviewed the images obtained:  CT-scan of the brain: Acute parenchymal hemorrhage in the posterior right frontal lobe with small amount of adjacent subarachnoid hemorrhage.  CTA Head: 1. No large vessel occlusion or hemodynamically significant proximal arterial stenosis. 2. The superior sagittal sinus is narrowed in the region of hemorrhage with poorly visualized draining cortical veins in this region. While indeterminate, this may be secondary to mass effect given no definite/clear intraluminal thrombus. Given these findings and the location of hemorrhage; however, recommend low threshold for follow-up CTV or MRI with contrast to ensure stability and exclude worsening thrombosis. 3. No evidence of and arteriovenous malformation or aneurysm in the region of intraparenchymal  hemorrhage, although acute blood products limit evaluation. Follow-up imaging after resolution of hemorrhage could provide further assessment if clinically indicated.  CTA Neck:  1. No significant (greater than 50%) stenosis. 2. Mild apparent irregularity of bilateral ICAs at the skull base is favored artifactual given streak artifact from dental amalgam in this region. Fibromuscular dysplasia is a differential consideration.  Assessment: 65 year old with history of mild cognitive impairment presenting with an acute cortical hemorrhage.  Location is atypical for hypertensive bleed but given history of cognitive impairment possibility of CAA.  No obvious underlying vascular malformation on imaging thus far but onset after sexual intercourse is suggestive of potential underlying obliterated vascular malformation.  Hemorrhagic transformation of an ischemic stroke is less likely given patient was normal though right parietal ischemic lesions can be minimally symptomatic.  Underlying mass is also possibility, difficult to evaluate for in the setting of acute hematoma.  Plan: # Acute parenchymal hemorrhage in the posterior right frontal lobe # Risk for seizure due to cortical location Acuity: Acute Current Suspected Etiology: CVA, less likely CVST  Continue Evaluation:  - Admit to: ICU - Stability scan in 6 hours or STAT with any neurological decline - Frequent neuro checks; q33min for 1 hour, then q1hour - No antiplatelets or anticoagulants due to ICH - SCD for DVT prophylaxis, add heparin at 24 hours if stable - Blood pressure control with goal systolic 120 - 140, cleverplex and labetalol PRN - Stroke labs, HgbA1c, fasting lipid panel - MRI brain with and without contrast when stabilized to evaluate for underlying mass, with MRV to evaluate for possible progression of possible cerebral venous sinus thrombosis versus mass-effect from hemorrhage on the venous flow - Risk factor modification -  Echocardiogram - Telemetry monitoring; 30 day event monitor on discharge if no arrythmias captured  - PT consult, OT consult, Speech consult when patient stabilized  - Stroke team to follow  CNS Cerebral edema -Hyperosmolar therapy if progressive, holding for now given no midline shift  Dysarthria Dysphagia following cerebral hemorrhage -NPO until cleared by speech -ST -Advance diet as tolerated -May need PEG   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  -PT/OT -PM&R consult  Concern for seizures due to cortical nature of IPH - Loaded with 1 gram of Keppra in ED - Keppra 500 mg BID (IV or PO- 1:1 conversion) - Inpatient seizure precautions - EEG if patient's exam fluctuates  RESP History of obstructive sleep apnea not on CPAP - Monitor  - Maintain SpO2 > 92%  CV Hypertensive Urgency with initial blood pressure of 194/104 -Aggressive BP control, goal SBP < 140  -PRN Labetalol, Cleviprex  Lipid Panel pending - Statin for goal LDL < 70  HEME AM CBC / hemoglobin -Monitor -Transfuse for hgb < 7  ENDO Monitor blood glucose with goal 140 - 180mg /dL. -SSI if needed -goal HgbA1c < 7%  GI/GU No reported history of kidney disease -Gentle hydration while NPO  Fluid/Electrolyte Disorders Monitor AM CMP -Replete -Repeat labs -Trend  ID No evidence of infection on admission - UA pending - Respiratory panel pending - Trend WBC with AM CBC  Prophylaxis DVT:  SCDs GI: PPI Bowel: Docusate / Senna  Diet: NPO until cleared by speech  Code Status: Full Code   THE FOLLOWING WERE PRESENT ON ADMISSION: CNS - SAH, IPH, Hemiparesis Respiratory - History of OSA not on CPAP Cardiovascular - Hypertensive Emergency Infectious - N/A GI - N/A Renal -  N/A Heme-  N/A Cancer - N/A Trauma - N/A  , AGAC-NP [facility] Pager: Lanae Boast([phone]  Attending Neurologist's note:  I personally saw  this patient, gathering history, performing a full neurologic examination, reviewing relevant labs, personally reviewing relevant imaging including head CT, CTA, and formulated the assessment and plan, adding the note above for completeness and clarity to accurately reflect my thoughts  Brooke DareSrishti Glena Pharris MD-PhD [facility] [phone] Available 7 AM to 7 PM, outside these hours please contact Neurologist on call listed on AMION  This patient is critically ill and at significant risk of neurological worsening, death and care requires constant monitoring of vital signs, hemodynamics,respiratory and cardiac monitoring, neurological assessment, discussion with family, other specialists and medical decision making of high complexity. I spent 60 minutes of neurocritical care time  in the care of  this patient. This was time spent independent of any time provided by nurse practitioner or PA.

## 2021-05-11 NOTE — Progress Notes (Signed)
1115:  Patient arrived to 4N from ED.  Patient is alert to self, place, and situation.  Patient hemiplegic on left side.  Patient currently complaining of neck pain/tightness.  RN notified and updated stroke team.  Patient's belongings sent home with wife.

## 2021-05-12 ENCOUNTER — Inpatient Hospital Stay (HOSPITAL_COMMUNITY): Payer: No Typology Code available for payment source

## 2021-05-12 DIAGNOSIS — R413 Other amnesia: Secondary | ICD-10-CM

## 2021-05-12 DIAGNOSIS — M159 Polyosteoarthritis, unspecified: Secondary | ICD-10-CM

## 2021-05-12 DIAGNOSIS — D72829 Elevated white blood cell count, unspecified: Secondary | ICD-10-CM

## 2021-05-12 DIAGNOSIS — I611 Nontraumatic intracerebral hemorrhage in hemisphere, cortical: Secondary | ICD-10-CM | POA: Diagnosis not present

## 2021-05-12 DIAGNOSIS — E876 Hypokalemia: Secondary | ICD-10-CM

## 2021-05-12 DIAGNOSIS — G4733 Obstructive sleep apnea (adult) (pediatric): Secondary | ICD-10-CM

## 2021-05-12 LAB — COMPREHENSIVE METABOLIC PANEL
ALT: 19 U/L (ref 0–44)
AST: 24 U/L (ref 15–41)
Albumin: 4.3 g/dL (ref 3.5–5.0)
Alkaline Phosphatase: 108 U/L (ref 38–126)
Anion gap: 13 (ref 5–15)
BUN: 7 mg/dL — ABNORMAL LOW (ref 8–23)
CO2: 25 mmol/L (ref 22–32)
Calcium: 9.7 mg/dL (ref 8.9–10.3)
Chloride: 100 mmol/L (ref 98–111)
Creatinine, Ser: 0.98 mg/dL (ref 0.61–1.24)
GFR, Estimated: >60 mL/min (ref >60)
Glucose, Bld: 139 mg/dL — ABNORMAL HIGH (ref 70–99)
Potassium: 3.3 mmol/L — ABNORMAL LOW (ref 3.5–5.1)
Sodium: 138 mmol/L (ref 135–145)
Total Bilirubin: 0.8 mg/dL (ref 0.3–1.2)
Total Protein: 7.5 g/dL (ref 6.5–8.1)

## 2021-05-12 LAB — CBC
HCT: 46.2 % (ref 39.0–52.0)
Hemoglobin: 15.6 g/dL (ref 13.0–17.0)
MCH: 30.2 pg (ref 26.0–34.0)
MCHC: 33.8 g/dL (ref 30.0–36.0)
MCV: 89.4 fL (ref 80.0–100.0)
Platelets: 232 10*3/uL (ref 150–400)
RBC: 5.17 MIL/uL (ref 4.22–5.81)
RDW: 13.2 % (ref 11.5–15.5)
WBC: 12.4 10*3/uL — ABNORMAL HIGH (ref 4.0–10.5)
nRBC: 0 % (ref 0.0–0.2)

## 2021-05-12 MED ORDER — POTASSIUM CHLORIDE CRYS ER 20 MEQ PO TBCR
40.0000 meq | EXTENDED_RELEASE_TABLET | ORAL | Status: AC
Start: 2021-05-12 — End: 2021-05-12
  Administered 2021-05-12 (×2): 40 meq via ORAL
  Filled 2021-05-12 (×2): qty 2

## 2021-05-12 MED ORDER — LABETALOL HCL 5 MG/ML IV SOLN
5.0000 mg | INTRAVENOUS | Status: DC | PRN
  Administered 2021-05-13: 5 mg via INTRAVENOUS
  Administered 2021-05-13: 10 mg via INTRAVENOUS
  Filled 2021-05-12 (×2): qty 4

## 2021-05-12 MED ORDER — GADOBUTROL 1 MMOL/ML IV SOLN
7.0000 mL | Freq: Once | INTRAVENOUS | Status: AC | PRN
  Administered 2021-05-12: 7 mL via INTRAVENOUS

## 2021-05-12 MED ORDER — PANTOPRAZOLE SODIUM 40 MG PO TBEC
40.0000 mg | DELAYED_RELEASE_TABLET | Freq: Every day | ORAL | Status: DC
  Administered 2021-05-12 – 2021-05-20 (×9): 40 mg via ORAL
  Filled 2021-05-12 (×9): qty 1

## 2021-05-12 MED ORDER — FENTANYL CITRATE (PF) 100 MCG/2ML IJ SOLN
12.5000 ug | Freq: Once | INTRAMUSCULAR | Status: AC
Start: 2021-05-12 — End: 2021-05-12
  Administered 2021-05-12: 12.5 ug via INTRAVENOUS
  Filled 2021-05-12: qty 2

## 2021-05-12 NOTE — Progress Notes (Signed)
STROKE TEAM PROGRESS NOTE   SUBJECTIVE (INTERVAL HISTORY) His wife and RN are at the bedside.  Overall his condition is stable. Pt lethargic but fully orientated, no aphasia, still has right gaze and left hemiplegia. MRI showed stable hematoma, no significant CAA. Not tolerating MRV, but yesterday CTA head and neck showed good venous phase and no CSVT. BP stable and will taper off cleviprex. No significant HTN hx.    OBJECTIVE Temp:  [97.4 F (36.3 C)-98.3 F (36.8 C)] 97.8 F (36.6 C) (06/01 0800) Pulse Rate:  [58-119] 81 (06/01 1000) Cardiac Rhythm: Normal sinus rhythm;Sinus tachycardia (06/01 0800) Resp:  [13-26] 20 (06/01 1000) BP: (101-161)/(53-109) 115/103 (06/01 1000) SpO2:  [79 %-100 %] 99 % (06/01 1000)  Recent Labs  Lab 05/11/21 0850  GLUCAP 162*   Recent Labs  Lab 05/11/21 0851 05/11/21 0901 05/12/21 0341  NA 139 142 138  K 3.5 3.5 3.3*  CL 110 107 100  CO2 22  --  25  GLUCOSE 156* 154* 139*  BUN 8 11 7*  CREATININE 1.18 1.10 0.98  CALCIUM 8.9  --  9.7   Recent Labs  Lab 05/11/21 0851 05/12/21 0341  AST 23 24  ALT 17 19  ALKPHOS 94 108  BILITOT 0.7 0.8  PROT 6.7 7.5  ALBUMIN 3.9 4.3   Recent Labs  Lab 05/11/21 0851 05/11/21 0901 05/12/21 0341  WBC 7.6  --  12.4*  NEUTROABS 1.7  --   --   HGB 14.8 15.0 15.6  HCT 45.5 44.0 46.2  MCV 91.4  --  89.4  PLT 257  --  232   No results for input(s): CKTOTAL, CKMB, CKMBINDEX, TROPONINI in the last 168 hours. Recent Labs    05/11/21 0851  LABPROT 13.4  INR 1.0   Recent Labs    05/11/21 1109  COLORURINE YELLOW  LABSPEC 1.026  PHURINE 6.0  GLUCOSEU NEGATIVE  HGBUR NEGATIVE  BILIRUBINUR NEGATIVE  KETONESUR NEGATIVE  PROTEINUR NEGATIVE  NITRITE NEGATIVE  LEUKOCYTESUR NEGATIVE       Component Value Date/Time   CHOL 232 (H) 05/11/2021 1118   CHOL 204 (H) 09/11/2020 1449   TRIG 73 05/11/2021 1118   HDL 77 05/11/2021 1118   HDL 63 09/11/2020 1449   CHOLHDL 3.0 05/11/2021 1118   VLDL 15  05/11/2021 1118   LDLCALC 140 (H) 05/11/2021 1118   LDLCALC 132 (H) 09/11/2020 1449   LDLCALC 107 (H) 04/29/2020 0826   Lab Results  Component Value Date   HGBA1C 5.8 (H) 05/11/2021      Component Value Date/Time   LABOPIA NONE DETECTED 05/11/2021 1109   COCAINSCRNUR NONE DETECTED 05/11/2021 1109   LABBENZ NONE DETECTED 05/11/2021 1109   AMPHETMU NONE DETECTED 05/11/2021 1109   THCU NONE DETECTED 05/11/2021 1109   LABBARB NONE DETECTED 05/11/2021 1109    Recent Labs  Lab 05/11/21 1118  ETH <10    I have personally reviewed the radiological images below and agree with the radiology interpretations.  CT HEAD WO CONTRAST  Result Date: 05/11/2021 CLINICAL DATA:  Stroke.  Intracranial hemorrhage follow-up. EXAM: CT HEAD WITHOUT CONTRAST TECHNIQUE: Contiguous axial images were obtained from the base of the skull through the vertex without intravenous contrast. COMPARISON:  Same day head CT. FINDINGS: Brain: Mild interval increase in the size of the right frontal intraparenchymal hemorrhage, measuring 4.5 x 2.6 x 3.5 cm (previously 4.2 x 2.3 x 3.1 cm). Similar adjacent small volume subarachnoid hemorrhage. A small amount of subdural hemorrhage in this  region is also not excluded. Surrounding edema is similar. Similar local mass effect without midline shift. Basal cisterns are patent. No evidence of acute large vascular territory infarct elsewhere. Similar mild white matter hypodensities, likely chronic microvascular ischemic disease. No hydrocephalus. Vascular: No hyperdense vessel.  Calcific atherosclerosis. Skull: No acute fracture. Sinuses/Orbits: Small left frontal sinus osteoma. Mild right and moderate left maxillary sinus mucosal thickening. Unremarkable orbits. Other: No mastoid effusions. IMPRESSION: Mild interval increase in the size of the right frontal intraparenchymal hemorrhage, measuring 4.5 x 2.6 x 3.5 cm (previously 4.2 x 2.3 x 3.1 cm). Similar adjacent small volume hemorrhage.  Electronically Signed   By: Feliberto Harts MD   On: 05/11/2021 17:05   MR BRAIN WO CONTRAST  Result Date: 05/12/2021 CLINICAL DATA:  65 year old male code stroke presentation with intra-axial right hemisphere hemorrhage. Hypertensive (194/104) on presentation. EXAM: MRI HEAD WITHOUT CONTRAST TECHNIQUE: Multiplanar, multiecho pulse sequences of the brain and surrounding structures were obtained without intravenous contrast. COMPARISON:  CT head CTA head and neck 05/11/2021 FINDINGS: Brain: Coronal T2 weighted imaging could not be obtained. And some of the exam is intermittently degraded by motion artifact despite repeated imaging attempts. T1 isointense intra-axial hemorrhage in the posterior right frontal lobe demonstrates a layering hematocrit level (series 13, image 19) and blood encompasses 43 by 38 x 33 mm (AP by transverse by CC) for an estimated volume of 26 mL. Regional edema. Mild regional mass effect. Trace subarachnoid hemorrhage also suspected on axial FLAIR imaging. Additionally, on SWI there is also chronic hemosiderin in the right parietal lobe on series 19 image 34. But no other definite chronic blood products. Susceptibility related abnormal diffusion in the posterior right frontal lobe with no convincing larger area of restricted diffusion. No restricted diffusion elsewhere. No IVH or ventriculomegaly. Normal basilar cisterns. Negative pituitary and cervicomedullary junction. Wallace Cullens and white matter signal outside of the affected right hemisphere is largely normal for age with mild nonspecific white matter changes. Vascular: Major intracranial vascular flow voids are preserved. Skull and upper cervical spine: Visualized bone marrow signal is within normal limits. Grossly negative cervical spine. Sinuses/Orbits: Negative orbits. Mild to moderate maxillary sinus mucosal thickening. Other: Mastoids are clear. Grossly normal visible internal auditory structures. IMPRESSION: 1. Posterior right  frontal lobe intra-axial hemorrhage with layering hematocrit level has not significantly changed (estimated blood volume of 26 mL). Surrounding edema and mild regional mass effect. Trace subarachnoid hemorrhage. 2. No larger underlying infarct is evident. But chronic hemosiderin in the right parietal lobe indicates a previous intra-axial hemorrhage in the region. 3. No other acute intracranial abnormality. Electronically Signed   By: Odessa Fleming M.D.   On: 05/12/2021 07:26   ECHOCARDIOGRAM COMPLETE  Result Date: 05/11/2021    ECHOCARDIOGRAM REPORT   Patient Name:   Donald [name] Date of Exam: 05/11/2021 Medical Rec #:  [id number]        Height:       68.0 in Accession #:    [id number]       Weight:       155.2 lb Date of Birth:  1956-05-29         BSA:          1.835 m Patient Age:    65 years         BP:           115/77 mmHg Patient Gender: M                HR:  84 bpm. Exam Location:  Inpatient Procedure: 2D Echo, Cardiac Doppler and Color Doppler Indications:    Stroke I63.9  History:        Patient has no prior history of Echocardiogram examinations.  Sonographer:    Roosvelt Maser RDCS Referring Phys: [id number] Reyne Dumas [facility] IMPRESSIONS  1. Left ventricular ejection fraction, by estimation, is 65 to 70%. The left ventricle has normal function. The left ventricle has no regional wall motion abnormalities. Left ventricular diastolic parameters were normal.  2. Right ventricular systolic function is normal. The right ventricular size is normal. Tricuspid regurgitation signal is inadequate for assessing PA pressure.  3. The mitral valve is normal in structure. No evidence of mitral valve regurgitation.  4. The aortic valve was not well visualized. Aortic valve regurgitation is not visualized. No aortic stenosis is present.  5. The inferior vena cava is normal in size with greater than 50% respiratory variability, suggesting right atrial pressure of 3 mmHg. FINDINGS  Left Ventricle: Left ventricular ejection  fraction, by estimation, is 65 to 70%. The left ventricle has normal function. The left ventricle has no regional wall motion abnormalities. The left ventricular internal cavity size was normal in size. There is  no left ventricular hypertrophy. Left ventricular diastolic parameters were normal. Right Ventricle: The right ventricular size is normal. No increase in right ventricular wall thickness. Right ventricular systolic function is normal. Tricuspid regurgitation signal is inadequate for assessing PA pressure. Left Atrium: Left atrial size was normal in size. Right Atrium: Right atrial size was normal in size. Pericardium: There is no evidence of pericardial effusion. Mitral Valve: The mitral valve is normal in structure. No evidence of mitral valve regurgitation. Tricuspid Valve: The tricuspid valve is normal in structure. Tricuspid valve regurgitation is not demonstrated. Aortic Valve: The aortic valve was not well visualized. Aortic valve regurgitation is not visualized. No aortic stenosis is present. Aortic valve mean gradient measures 3.0 mmHg. Aortic valve peak gradient measures 6.7 mmHg. Aortic valve area, by VTI measures 2.77 cm. Pulmonic Valve: The pulmonic valve was not well visualized. Pulmonic valve regurgitation is not visualized. Aorta: The aortic root and ascending aorta are structurally normal, with no evidence of dilitation. Venous: The inferior vena cava is normal in size with greater than 50% respiratory variability, suggesting right atrial pressure of 3 mmHg. IAS/Shunts: The interatrial septum was not well visualized.  LEFT VENTRICLE PLAX 2D LVIDd:         4.90 cm  Diastology LVIDs:         2.80 cm  LV e' medial:    8.38 cm/s LV PW:         0.80 cm  LV E/e' medial:  9.0 LV IVS:        0.90 cm  LV e' lateral:   9.14 cm/s LVOT diam:     1.90 cm  LV E/e' lateral: 8.2 LV SV:         69 LV SV Index:   37 LVOT Area:     2.84 cm  RIGHT VENTRICLE RV Basal diam:  2.90 cm LEFT ATRIUM           Index        RIGHT ATRIUM           Index LA diam:      3.10 cm 1.69 cm/m  RA Area:     11.20 cm LA Vol (A2C): 26.8 ml 14.61 ml/m RA Volume:   21.90 ml  11.94 ml/m LA Vol (  A4C): 39.2 ml 21.36 ml/m  AORTIC VALVE AV Area (Vmax):    2.64 cm AV Area (Vmean):   2.59 cm AV Area (VTI):     2.77 cm AV Vmax:           129.00 cm/s AV Vmean:          86.700 cm/s AV VTI:            0.248 m AV Peak Grad:      6.7 mmHg AV Mean Grad:      3.0 mmHg LVOT Vmax:         120.00 cm/s LVOT Vmean:        79.200 cm/s LVOT VTI:          0.242 m LVOT/AV VTI ratio: 0.98  AORTA Ao Root diam: 2.50 cm Ao Asc diam:  3.30 cm MITRAL VALVE MV Area (PHT): 3.60 cm     SHUNTS MV Decel Time: 211 msec     Systemic VTI:  0.24 m MV E velocity: 75.30 cm/s   Systemic Diam: 1.90 cm MV A velocity: 107.00 cm/s MV E/A ratio:  0.70 Epifanio Lesches MD Electronically signed by Epifanio Lesches MD Signature Date/Time: 05/11/2021/5:28:15 PM    Final    CT HEAD CODE STROKE WO CONTRAST  Result Date: 05/11/2021 CLINICAL DATA:  Code stroke. Left-sided weakness, nausea, and vomiting. EXAM: CT HEAD WITHOUT CONTRAST TECHNIQUE: Contiguous axial images were obtained from the base of the skull through the vertex without intravenous contrast. COMPARISON:  None. FINDINGS: Brain: An acute parenchymal hemorrhage in the high posterior right frontal lobe measures 4.2 x 2.3 x 3.1 cm (approximate volume of 15 mL). There is mild surrounding edema without midline shift, and there is a small amount of adjacent subarachnoid hemorrhage. A very small amount of coexistent subdural hemorrhage is also not excluded in this region. No acute infarct is identified elsewhere. The ventricles are normal in size. Hypodensities in the cerebral white matter bilaterally are nonspecific but compatible with mild chronic small vessel ischemic disease. Vascular: No hyperdense vessel. Skull: No fracture or suspicious osseous lesion. Sinuses/Orbits: Small osteoma in the left frontal sinus. Mild  right and moderate left maxillary sinus mucosal thickening. Clear mastoid air cells. Unremarkable orbits. Other: None. ASPECTS [facility] Stroke Program Early CT Score) Not scored in the presence of acute hemorrhage. IMPRESSION: Acute parenchymal hemorrhage in the posterior right frontal lobe with small amount of adjacent subarachnoid hemorrhage. These results were communicated to Dr. Iver Nestle at 9:08 am on 05/11/2021 by text page via the [facility] messaging system. Electronically Signed   By: Sebastian Ache M.D.   On: 05/11/2021 09:12   CT ANGIO HEAD NECK W WO CM (CODE STROKE)  Result Date: 05/11/2021 CLINICAL DATA:  Stroke follow-up.  Left-sided weakness. EXAM: CT ANGIOGRAPHY HEAD AND NECK TECHNIQUE: Multidetector CT imaging of the head and neck was performed using the standard protocol during bolus administration of intravenous contrast. Multiplanar CT image reconstructions and MIPs were obtained to evaluate the vascular anatomy. Carotid stenosis measurements (when applicable) are obtained utilizing NASCET criteria, using the distal internal carotid diameter as the denominator. CONTRAST:  75mL OMNIPAQUE IOHEXOL 350 MG/ML SOLN COMPARISON:  Same day CT head. FINDINGS: CTA NECK FINDINGS Aortic arch: Great vessel origins are patent. Right carotid system: No evidence of dissection, stenosis (50% or greater) or occlusion. Mild apparent irregularity of the ICA at the skull base in a region of streak artifact. Left carotid system: No evidence of dissection, stenosis (50% or greater) or occlusion.  Mild apparent irregularity of the ICA at the skull base in a region of streak artifact. Vertebral arteries: Codominant. No evidence of dissection, stenosis (50% or greater) or occlusion. Skeleton: Moderate degenerative disc disease at at C5-C6 and C6-C7 with disc height loss, endplate sclerosis and posterior disc osteophyte complexes. Other neck: No acute abnormality Upper chest: Visualized lung apices are clear. Review of the MIP  images confirms the above findings CTA HEAD FINDINGS Anterior circulation: No large vessel occlusion or proximal hemodynamically significant stenosis. Evaluation of the distal vasculature is limited due to venous contamination. No evidence of and arteriovenous malformation or aneurysm in the region of intraparenchymal hemorrhage, although acute blood products limit evaluation. Posterior circulation: No large vessel occlusion or proximal hemodynamically significant stenosis. Venous sinuses: The superior sagittal sinus is narrowed in the region of hemorrhage, most likely from mass effect. Small right transverse and sigmoid sinuses. Review of the MIP images confirms the above findings IMPRESSION: CTA Head: 1. No large vessel occlusion or hemodynamically significant proximal arterial stenosis. 2. The superior sagittal sinus is narrowed in the region of hemorrhage with poorly visualized draining cortical veins in this region. While indeterminate, this may be secondary to mass effect given no definite/clear intraluminal thrombus. Given these findings and the location of hemorrhage; however, recommend low threshold for follow-up CTV or MRI with contrast to ensure stability and exclude worsening thrombosis. 3. No evidence of and arteriovenous malformation or aneurysm in the region of intraparenchymal hemorrhage, although acute blood products limit evaluation. Follow-up imaging after resolution of hemorrhage could provide further assessment if clinically indicated. CTA Neck: 1. No significant (greater than 50%) stenosis. 2. Mild apparent irregularity of bilateral ICAs at the skull base is favored artifactual given streak artifact from dental amalgam in this region. Fibromuscular dysplasia is a differential consideration. Findings and recommendations discussed with Dr. Iver Nestle via telephone at 9:59 a.m. Electronically Signed   By: Feliberto Harts MD   On: 05/11/2021 10:09    PHYSICAL EXAM  Temp:  [97.4 F (36.3 C)-98.3  F (36.8 C)] 97.8 F (36.6 C) (06/01 0800) Pulse Rate:  [58-119] 81 (06/01 1000) Resp:  [13-26] 20 (06/01 1000) BP: (101-161)/(53-109) 115/103 (06/01 1000) SpO2:  [79 %-100 %] 99 % (06/01 1000)  General - Well nourished, well developed, in no apparent distress.  Ophthalmologic - fundi not visualized due to noncooperation.  Cardiovascular - Regular rhythm and rate.  Neuro - lethargic but awake, alert, eyes open, orientated to age, place, time and people. No aphasia, paucity of speech, following all simple commands. Able to name and repeat. Right gaze not cross midline, visual field full, PERRL. Left facial droop. Tongue midline. Right UE 5/5 and LE 4/5, LUE and LLE plegic Sensation symmetrical bilaterally subjectively. right FTN intact, gait not tested.     ASSESSMENT/PLAN [name] is a 65 y.o. male with history of demential with behavioral disturbance and OSA on CPAP admitted for right gaze and left hemoplegia, left facial droop. No tPA given due to ICH.    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 No CSVT on venous phase  CT repeat mildly increased left frontal ICH  MRI brain no significant change of ICH since last CT  MRV pending  May need to repeat CTA head and MRI with and without contrast once hematoma resolves as outpt to rule out underlying source which  is masked by current ICH.   2D Echo  EF 65-70%  LDL 140  HgbA1c 5.8  UDS neg  SCDs for VTE prophylaxis  No antithrombotic prior to admission, now on No antithrombotic given ICH  Ongoing aggressive stroke risk factor management  Therapy recommendations:  Pending   Disposition:  Pending   ?? CAA  MRI did not show typical CAA pattern but did show some chronic hemosiderin in the right parietal lobe indicates a previous intra-axial hemorrhage in the region.  Lobar ICH without  significant hx of HTN concerning for CAA  Pt does have hx of cognitive impairment with behavioral disturbance per wife, especially since 11/2019 after a fall  Against antithrombotic use in the future given concerns of CAA at this time.  Dementia   Has appointment with [facility] for LP and evaluation of dementia, but pt not able to go due to onset of ICH  Delirium precaution  Continue outpt follow up with [facility]  BP management . Stable . Off cleviprex now . BP goal < 160  Long term BP goal normotensive  Hyperlipidemia  Home meds:  none   LDL 140, goal < 70  Will consider low dose statin (lipitor 20) on discharge   Other Stroke Risk Factors  Advanced age  Obstructive sleep apnea, on CPAP at home  Other Active Problems  Hypokalemia - K 3.3 - supplement  Leukocytosis WBC 7.6->12.4 - monitoring   Still NPO - speech to follow  Hospital day # 1  This patient is critically ill due to ICH, ? CAA, dementia and at significant risk of neurological worsening, death form hematoma expansion, cerebral edema, brain herniation, recurrent ICH, hypertensive emergency. This patient's care requires constant monitoring of vital signs, hemodynamics, respiratory and cardiac monitoring, review of multiple databases, neurological assessment, discussion with family, other specialists and medical decision making of high complexity. I spent 40 minutes of neurocritical care time in the care of this patient. I had long discussion with wife at bedside, updated pt current condition, treatment plan and potential prognosis, and answered all the questions. She expressed understanding and appreciation.     Donald PlanJindong Ana Woodroof, MD PhD Stroke Neurology 05/12/2021 10:19 AM    To contact Stroke Continuity provider, please refer to [email or web address]. After hours, contact General Neurology

## 2021-05-12 NOTE — Evaluation (Signed)
Physical Therapy Evaluation Patient Details Name: Donald [name] MRN: [id number] DOB: Jun 16, 1956 Today's Date: 05/12/2021   History of Present Illness  Pt is a 65 y.o. M who presents with right gaze and left hemiplegia. 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 Significant PMH: memory deficits, OSA.  Clinical Impression  Prior to admission, pt lives with his spouse and mother in law and works as a part Science writer. He has a history of short term memory deficits and was in the process of receiving further work up. Pt presents with decreased functional mobility secondary to left hemiparesis, extensor tone, decreased sitting/standing balance, and cognitive impairments. Pt requiring two person mod-max assist for mobility. Displays initial posterior and left lateral lean, but able to correct briefly with multimodal cueing. Standing from edge of bed x 2 with left knee block. Transfer to chair deferred as pt leaving for MRI. Suspect good progress considering pt age, PLOF, family support, and high levels of motivation. Strongly recommend CIR to address deficits, maximize functional mobility and decrease caregiver burden.     Follow Up Recommendations CIR    Equipment Recommendations  3in1 (PT);Wheelchair (measurements PT);Wheelchair cushion (measurements PT)    Recommendations for Other Services Rehab consult     Precautions / Restrictions Precautions Precautions: Fall Precaution Comments: L hemiparesis, L neglect, extensor tone, BP < 140 Restrictions Weight Bearing Restrictions: No      Mobility  Bed Mobility Overal bed mobility: Needs Assistance Bed Mobility: Rolling;Sidelying to Sit;Sit to Supine Rolling: Min guard Sidelying to sit: Mod assist;+2 for physical assistance   Sit to supine: Mod assist;+2 for physical assistance   General bed mobility comments: Min guard for rolling to left, cues for use of bed rail, modA + 2 for LLE and trunk management.     Transfers Overall transfer level: Needs assistance Equipment used: None Transfers: Sit to/from Stand Sit to Stand: Max assist;+2 physical assistance         General transfer comment: MaxA + 2 to stand x 2 from edge of bed, left knee block, good initiation and cues for looking up and upright posture.  Ambulation/Gait             General Gait Details: unable  Stairs            Wheelchair Mobility    Modified Rankin (Stroke Patients Only) Modified Rankin (Stroke Patients Only) Pre-Morbid Rankin Score: No symptoms Modified Rankin: Severe disability     Balance Overall balance assessment: Needs assistance Sitting-balance support: Feet supported;Single extremity supported Sitting balance-Leahy Scale: Poor Sitting balance - Comments: Pt with posterior and left lateral lean, requiring up to maxA but progressing intermittently to min guard assist. Max multimodal visual/verbal/tactile cues for midline positioning, cervical/truncal extension, placed R hand in lap due to mild pushing.                                     Pertinent Vitals/Pain Pain Assessment: Faces Faces Pain Scale: Hurts a little bit Pain Location: right side of neck Pain Intervention(s): Limited activity within patient's tolerance;Monitored during session    Home Living Family/patient expects to be discharged to:: Private residence Living Arrangements: Spouse/significant other;Other (Comment) (parents) Available Help at Discharge: Family;Available 24 hours/day Type of Home: House Home Access: Stairs to enter Entrance Stairs-Rails: Left Entrance Stairs-Number of Steps: 4 Home Layout: One level Home Equipment: Shower seat - built in;Grab bars - toilet;Grab  bars - tub/shower Additional Comments: has access to RW from pt's MIL    Prior Function Level of Independence: Independent         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     Hand Dominance   Dominant Hand: Right    Extremity/Trunk Assessment   Upper Extremity Assessment Upper Extremity Assessment: Defer to OT evaluation    Lower Extremity Assessment Lower Extremity Assessment: LLE deficits/detail LLE Deficits / Details: No active movement noted, extensor tone. Reactive to painful stimuli    Cervical / Trunk Assessment Cervical / Trunk Assessment: Normal  Communication   Communication: Expressive difficulties  Cognition Arousal/Alertness: Awake/alert Behavior During Therapy: Flat affect Overall Cognitive Status: History of cognitive impairments - at baseline Area of Impairment: Orientation;Safety/judgement;Awareness;Problem solving;Following commands;Attention                 Orientation Level: Disoriented to;Time;Situation Current Attention Level: Sustained   Following Commands: Follows one step commands inconsistently Safety/Judgement: Decreased awareness of safety;Decreased awareness of deficits Awareness: Intellectual Problem Solving: Requires verbal cues General Comments: Pt with expressive aphasia; able to state name and birthday. Reported month was "may," and unable to state year. When asked what brought him in, pt stating, "my brain." When asked how he did with therapy, pt stating, "wonderful." Decreased awareness of safety/deficits. Pt with history of short term memory deficits; was in process of getting diagnosis for dementia PTA. Following > 75% of 1 step commands.      General Comments      Exercises     Assessment/Plan    PT Assessment Patient needs continued PT services  PT Problem List Decreased strength;Decreased activity tolerance;Decreased balance;Decreased mobility;Decreased cognition;Decreased safety awareness;Impaired sensation;Impaired tone       PT Treatment Interventions DME instruction;Gait training;Functional mobility training;Therapeutic exercise;Therapeutic activities;Balance  training;Neuromuscular re-education;Cognitive remediation;Patient/family education;Wheelchair mobility training    PT Goals (Current goals can be found in the Care Plan section)  Acute Rehab PT Goals Patient Stated Goal: pt wife would like him to return to baseline PT Goal Formulation: With patient/family Time For Goal Achievement: 05/26/21 Potential to Achieve Goals: Good    Frequency Min 4X/week   Barriers to discharge        Co-evaluation PT/OT/SLP Co-Evaluation/Treatment: Yes Reason for Co-Treatment: Necessary to address cognition/behavior during functional activity;For patient/therapist safety;To address functional/ADL transfers;Complexity of the patient's impairments (multi-system involvement) PT goals addressed during session: Mobility/safety with mobility         AM-PAC PT "6 Clicks" Mobility  Outcome Measure Help needed turning from your back to your side while in a flat bed without using bedrails?: A Little Help needed moving from lying on your back to sitting on the side of a flat bed without using bedrails?: A Lot Help needed moving to and from a bed to a chair (including a wheelchair)?: Total Help needed standing up from a chair using your arms (e.g., wheelchair or bedside chair)?: Total Help needed to walk in hospital room?: Total Help needed climbing 3-5 steps with a railing? : Total 6 Click Score: 9    End of Session Equipment Utilized During Treatment: Gait belt Activity Tolerance: Patient tolerated treatment well Patient left: in bed;with call bell/phone within reach;Other (comment) (leaving for MRI) Nurse Communication: Mobility status PT Visit Diagnosis: Hemiplegia and hemiparesis;Difficulty in walking, not elsewhere classified (R26.2) Hemiplegia - Right/Left: Left Hemiplegia - dominant/non-dominant: Non-dominant Hemiplegia - caused by: Nontraumatic intracerebral hemorrhage    Time: [id number]  PT Time Calculation (min) (ACUTE ONLY): 34 min   Charges:    PT Evaluation $PT Eval Moderate Complexity: 1 Mod          Lillia Pauls, [place], DPT Acute Rehabilitation Services Pager [phone] Office [phone]   Norval Morton 05/12/2021, 11:34 AM

## 2021-05-12 NOTE — Consult Note (Signed)
Physical Medicine and Rehabilitation Consult   Reason for Consult: ICH with functional deficits. Referring Physician: Dr. Roda Shutters   HPI: Donald [name] is a 65 y.o. RH-male with history of cognitive deficits with behavioral changes--in process of being worked up for Alzheimer's dementia, OSA,OA who was admitted on 05/11/2021 with acute left hemiparesis and sensory deficits, right gaze preference and left facial droop.  UDS negative.  BP elevated at admission--194/104 and CT head done showing 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  CTA head/neck was negative for LVO and showed narrowing of superior sagittal sinus with question of mass-effect versus intraluminal thrombus.  Follow-up MRI/MRA brain showed no significant change in bleed with surrounding edema and mild mass-effect and chronic hemosiderin right parietal lobe indicating site of prior hemorrhage.  He was started on Cleviprex for BP control and Dr. Roda Shutters question CAA.Marland Kitchen   Echocardiogram with ejection fraction of 65 to 70%, no regional wall abnormality. MRV brain done showing no evidence of intracranial venous thrombosis and mild vascular enhancement could be reactive.  Work-up ongoing and neurology recommends repeat CTA head/MRI once hematoma resolves on outpatient basis to rule out underlying source of bleed.  Therapy evaluation initiated and patient noted to be limited by left hemiplegia with left neglect and extensor tone.  Swallow evaluation done and patient started on dysphagia 1 thin liquids.  CIR recommended due to functional decline.  Review of Systems  Unable to perform ROS: Language    Past Medical History:  Diagnosis Date  . Arthritis     Past Surgical History:  Procedure Laterality Date  . APPENDECTOMY      Family History  Problem Relation Age of Onset  . Hypertension Mother   . Colon polyps Neg Hx   . Colon cancer Neg Hx   . Esophageal cancer Neg Hx   . Rectal cancer Neg Hx   . Stomach cancer  Neg Hx     Social History: Married. He is a Education officer, environmental and works part time. He  reports that he has never smoked. He has never used smokeless tobacco. He reports that he does not drink alcohol and does not use drugs.    Allergies  Allergen Reactions  . Sulfa Antibiotics Other (See Comments)    Nausea and eye swelling    Medications Prior to Admission  Medication Sig Dispense Refill  . Multiple Vitamins-Minerals (MENS 50+ MULTI VITAMIN/MIN PO) Take 1 Dose by mouth in the morning and at bedtime.    . mefloquine (LARIAM) 250 MG tablet Take 1 tablet (250 mg total) by mouth every 7 (seven) days. (Patient not taking: No sig reported) 16 tablet 0    Home: Home Living Family/patient expects to be discharged to:: Private residence Living Arrangements: Spouse/significant other,Other (Comment) (parents) Available Help at Discharge: Family,Available 24 hours/day Type of Home: House Home Access: Stairs to enter [employer] of Steps: 4 Entrance Stairs-Rails: Left Home Layout: One level Bathroom Shower/Tub: Health visitor: Standard Home Equipment: Shower seat - built in,Grab bars - toilet,Grab bars - tub/shower Additional Comments: has access to RW from pt's MIL  Lives With: Spouse  Functional History: Prior Function Level of Independence: Independent Comments: part-time Education officer, 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 Functional Status:  Mobility: Bed Mobility Overal bed mobility: Needs Assistance Bed Mobility: Rolling,Sidelying to Sit,Sit to Supine Rolling: Min guard Sidelying to sit: Mod assist,+2 for physical assistance Sit to supine: Mod assist,+2 for physical assistance General bed  mobility comments: Min guard for rolling to left, cues for use of bed rail, modA + 2 for LLE and trunk management. Transfers Overall transfer level: Needs assistance Equipment used: None Transfers: Sit to/from Stand Sit to Stand: Max  assist,+2 physical assistance General transfer comment: MaxA + 2 to stand x 2 from edge of bed, left knee block, good initiation and cues for looking up and upright posture. Ambulation/Gait General Gait Details: unable    ADL:    Cognition: Cognition Overall Cognitive Status:  (? has memory impairments- not certain if presentation is baseline) Arousal/Alertness: Awake/alert Orientation Level: Oriented to person,Disoriented to time,Disoriented to situation,Oriented to place Attention: Sustained Sustained Attention: Impaired Sustained Attention Impairment: Verbal basic,Functional basic Memory:  (hx of impairments- will assess further) Awareness: Impaired Awareness Impairment: Intellectual impairment,Emergent impairment,Anticipatory impairment Problem Solving: Impaired Problem Solving Impairment: Verbal basic,Functional basic Safety/Judgment: Impaired Cognition Arousal/Alertness: Awake/alert Behavior During Therapy: Flat affect Overall Cognitive Status:  (? has memory impairments- not certain if presentation is baseline) Area of Impairment: Orientation,Safety/judgement,Awareness,Problem solving,Following commands,Attention Orientation Level: Disoriented to,Time,Situation Current Attention Level: Sustained Following Commands: Follows one step commands inconsistently Safety/Judgement: Decreased awareness of safety,Decreased awareness of deficits Awareness: Intellectual Problem Solving: Requires verbal cues General Comments: Pt with expressive aphasia; able to state name and birthday. Reported month was "may," and unable to state year. When asked what brought him in, pt stating, "my brain." When asked how he did with therapy, pt stating, "wonderful." Decreased awareness of safety/deficits. Pt with history of short term memory deficits; was in process of getting diagnosis for dementia PTA. Following > 75% of 1 step commands.   Blood pressure (!) 145/82, pulse 64, temperature 98.3 F  (36.8 C), temperature source Oral, resp. rate 11, weight 70.4 kg, SpO2 99 %. Physical Exam Vitals and nursing note reviewed.  Constitutional:      General: He is not in acute distress.    Appearance: Normal appearance.  HENT:     Head: Normocephalic and atraumatic.     Right Ear: External ear normal.     Left Ear: External ear normal.     Nose: Nose normal.  Eyes:     General:        Right eye: No discharge.        Left eye: No discharge.     Comments: Left eye ptosis  Cardiovascular:     Rate and Rhythm: Normal rate and regular rhythm.  Pulmonary:     Effort: Pulmonary effort is normal. No respiratory distress.     Breath sounds: No stridor.  Abdominal:     General: Abdomen is flat. Bowel sounds are normal. There is no distension.  Musculoskeletal:     Cervical back: Normal range of motion and neck supple.     Comments: No edema or tenderness in extremities  Skin:    General: Skin is warm and dry.  Neurological:     Mental Status: He is alert.     Comments: Alert Significant left facial weakness Dysarthria Right gaze prefrence with inability to move eyes to left field.  Oriented to self only. Place "[place]". He was unable to state place or situation with cues. Unable to recall age or DOB.  Motor: LUE/LLE: 0/5 proximal and distal with increased tone   Psychiatric:     Comments: Limited due to speech/mentation     Results for orders placed or performed during the hospital encounter of 05/11/21 (from the past 24 hour(s))  CBC     Status: Abnormal  Collection Time: 05/12/21  3:41 AM  Result Value Ref Range   WBC 12.4 (H) 4.0 - 10.5 K/uL   RBC 5.17 4.22 - 5.81 MIL/uL   Hemoglobin 15.6 13.0 - 17.0 g/dL   HCT 95.6 21.3 - 08.6 %   MCV 89.4 80.0 - 100.0 fL   MCH 30.2 26.0 - 34.0 pg   MCHC 33.8 30.0 - 36.0 g/dL   RDW 57.8 46.9 - 62.9 %   Platelets 232 150 - 400 K/uL   nRBC 0.0 0.0 - 0.2 %  Comprehensive metabolic panel     Status: Abnormal   Collection Time:  05/12/21  3:41 AM  Result Value Ref Range   Sodium 138 135 - 145 mmol/L   Potassium 3.3 (L) 3.5 - 5.1 mmol/L   Chloride 100 98 - 111 mmol/L   CO2 25 22 - 32 mmol/L   Glucose, Bld 139 (H) 70 - 99 mg/dL   BUN 7 (L) 8 - 23 mg/dL   Creatinine, Ser 5.28 0.61 - 1.24 mg/dL   Calcium 9.7 8.9 - 41.3 mg/dL   Total Protein 7.5 6.5 - 8.1 g/dL   Albumin 4.3 3.5 - 5.0 g/dL   AST 24 15 - 41 U/L   ALT 19 0 - 44 U/L   Alkaline Phosphatase 108 38 - 126 U/L   Total Bilirubin 0.8 0.3 - 1.2 mg/dL   GFR, Estimated >24 >40 mL/min   Anion gap 13 5 - 15   CT HEAD WO CONTRAST  Result Date: 05/11/2021 CLINICAL DATA:  Stroke.  Intracranial hemorrhage follow-up. EXAM: CT HEAD WITHOUT CONTRAST TECHNIQUE: Contiguous axial images were obtained from the base of the skull through the vertex without intravenous contrast. COMPARISON:  Same day head CT. FINDINGS: Brain: Mild interval increase in the size of the right frontal intraparenchymal hemorrhage, measuring 4.5 x 2.6 x 3.5 cm (previously 4.2 x 2.3 x 3.1 cm). Similar adjacent small volume subarachnoid hemorrhage. A small amount of subdural hemorrhage in this region is also not excluded. Surrounding edema is similar. Similar local mass effect without midline shift. Basal cisterns are patent. No evidence of acute large vascular territory infarct elsewhere. Similar mild white matter hypodensities, likely chronic microvascular ischemic disease. No hydrocephalus. Vascular: No hyperdense vessel.  Calcific atherosclerosis. Skull: No acute fracture. Sinuses/Orbits: Small left frontal sinus osteoma. Mild right and moderate left maxillary sinus mucosal thickening. Unremarkable orbits. Other: No mastoid effusions. IMPRESSION: Mild interval increase in the size of the right frontal intraparenchymal hemorrhage, measuring 4.5 x 2.6 x 3.5 cm (previously 4.2 x 2.3 x 3.1 cm). Similar adjacent small volume hemorrhage. Electronically Signed   By: Feliberto Harts MD   On: 05/11/2021 17:05    MR BRAIN WO CONTRAST  Result Date: 05/12/2021 CLINICAL DATA:  65 year old male code stroke presentation with intra-axial right hemisphere hemorrhage. Hypertensive (194/104) on presentation. EXAM: MRI HEAD WITHOUT CONTRAST TECHNIQUE: Multiplanar, multiecho pulse sequences of the brain and surrounding structures were obtained without intravenous contrast. COMPARISON:  CT head CTA head and neck 05/11/2021 FINDINGS: Brain: Coronal T2 weighted imaging could not be obtained. And some of the exam is intermittently degraded by motion artifact despite repeated imaging attempts. T1 isointense intra-axial hemorrhage in the posterior right frontal lobe demonstrates a layering hematocrit level (series 13, image 19) and blood encompasses 43 by 38 x 33 mm (AP by transverse by CC) for an estimated volume of 26 mL. Regional edema. Mild regional mass effect. Trace subarachnoid hemorrhage also suspected on axial FLAIR imaging. Additionally, on SWI  there is also chronic hemosiderin in the right parietal lobe on series 19 image 34. But no other definite chronic blood products. Susceptibility related abnormal diffusion in the posterior right frontal lobe with no convincing larger area of restricted diffusion. No restricted diffusion elsewhere. No IVH or ventriculomegaly. Normal basilar cisterns. Negative pituitary and cervicomedullary junction. Wallace CullensGray and white matter signal outside of the affected right hemisphere is largely normal for age with mild nonspecific white matter changes. Vascular: Major intracranial vascular flow voids are preserved. Skull and upper cervical spine: Visualized bone marrow signal is within normal limits. Grossly negative cervical spine. Sinuses/Orbits: Negative orbits. Mild to moderate maxillary sinus mucosal thickening. Other: Mastoids are clear. Grossly normal visible internal auditory structures. IMPRESSION: 1. Posterior right frontal lobe intra-axial hemorrhage with layering hematocrit level has not  significantly changed (estimated blood volume of 26 mL). Surrounding edema and mild regional mass effect. Trace subarachnoid hemorrhage. 2. No larger underlying infarct is evident. But chronic hemosiderin in the right parietal lobe indicates a previous intra-axial hemorrhage in the region. 3. No other acute intracranial abnormality. Electronically Signed   By: Odessa FlemingH  Hall M.D.   On: 05/12/2021 07:26   MR MRV HEAD W WO CONTRAST  Result Date: 05/12/2021 CLINICAL DATA:  Intracranial hemorrhage. EXAM: MR VENOGRAM HEAD WITHOUT AND WITH CONTRAST TECHNIQUE: Angiographic images of the intracranial venous structures were acquired using MRV technique without and with intravenous contrast. CONTRAST:  7mL GADAVIST GADOBUTROL 1 MMOL/ML IV SOLN COMPARISON:  Brain MRI performed earlier today 05/12/2021. Noncontrast head CT examinations 05/11/2021. CT angiogram head/neck 05/11/2021. FINDINGS: The superior sagittal sinus, internal cerebral veins, vein of Galen, straight sinus, transverse sinuses, sigmoid sinuses and visualized jugular veins are patent. No appreciable intracranial venous thrombosis. Hypoplastic right transverse and sigmoid dural venous sinuses. An acute parenchymal hemorrhage within the posterior right frontal lobe is grossly unchanged in size as compared to the brain MRI performed earlier today. There is mild curvilinear vascular enhancement overlying the site of hemorrhage, which may be reactive. Elsewhere, no abnormal intracranial enhancement is identified. IMPRESSION: No evidence of intracranial venous thrombosis. Non dominant right transverse and sigmoid dural venous sinuses. Electronically Signed   By: Jackey LogeKyle  Golden DO   On: 05/12/2021 11:27   ECHOCARDIOGRAM COMPLETE  Result Date: 05/11/2021    ECHOCARDIOGRAM REPORT   [facility] Date of Exam: 05/11/2021 Medical Rec #:  [id number]        Height:       68.0 in Accession #:    [id number]       Weight:       155.2 lb Date of Birth:  06/03/1956          BSA:          1.835 m Patient Age:    65 years         BP:           115/77 mmHg Patient Gender: M                HR:           84 bpm. Exam Location:  Inpatient Procedure: 2D Echo, Cardiac Doppler and Color Doppler Indications:    Stroke I63.9  History:        Patient has no prior history of Echocardiogram examinations.  Sonographer:    Roosvelt Maserachel Lane RDCS Referring Phys: [id number] Reyne DumasSTEVI W [facility] IMPRESSIONS  1. Left ventricular ejection fraction, by estimation, is 65 to 70%. The left ventricle has normal function.  The left ventricle has no regional wall motion abnormalities. Left ventricular diastolic parameters were normal.  2. Right ventricular systolic function is normal. The right ventricular size is normal. Tricuspid regurgitation signal is inadequate for assessing PA pressure.  3. The mitral valve is normal in structure. No evidence of mitral valve regurgitation.  4. The aortic valve was not well visualized. Aortic valve regurgitation is not visualized. No aortic stenosis is present.  5. The inferior vena cava is normal in size with greater than 50% respiratory variability, suggesting right atrial pressure of 3 mmHg. FINDINGS  Left Ventricle: Left ventricular ejection fraction, by estimation, is 65 to 70%. The left ventricle has normal function. The left ventricle has no regional wall motion abnormalities. The left ventricular internal cavity size was normal in size. There is  no left ventricular hypertrophy. Left ventricular diastolic parameters were normal. Right Ventricle: The right ventricular size is normal. No increase in right ventricular wall thickness. Right ventricular systolic function is normal. Tricuspid regurgitation signal is inadequate for assessing PA pressure. Left Atrium: Left atrial size was normal in size. Right Atrium: Right atrial size was normal in size. Pericardium: There is no evidence of pericardial effusion. Mitral Valve: The mitral valve is normal in structure. No evidence of  mitral valve regurgitation. Tricuspid Valve: The tricuspid valve is normal in structure. Tricuspid valve regurgitation is not demonstrated. Aortic Valve: The aortic valve was not well visualized. Aortic valve regurgitation is not visualized. No aortic stenosis is present. Aortic valve mean gradient measures 3.0 mmHg. Aortic valve peak gradient measures 6.7 mmHg. Aortic valve area, by VTI measures 2.77 cm. Pulmonic Valve: The pulmonic valve was not well visualized. Pulmonic valve regurgitation is not visualized. Aorta: The aortic root and ascending aorta are structurally normal, with no evidence of dilitation. Venous: The inferior vena cava is normal in size with greater than 50% respiratory variability, suggesting right atrial pressure of 3 mmHg. IAS/Shunts: The interatrial septum was not well visualized.  LEFT VENTRICLE PLAX 2D LVIDd:         4.90 cm  Diastology LVIDs:         2.80 cm  LV e' medial:    8.38 cm/s LV PW:         0.80 cm  LV E/e' medial:  9.0 LV IVS:        0.90 cm  LV e' lateral:   9.14 cm/s LVOT diam:     1.90 cm  LV E/e' lateral: 8.2 LV SV:         69 LV SV Index:   37 LVOT Area:     2.84 cm  RIGHT VENTRICLE RV Basal diam:  2.90 cm LEFT ATRIUM           Index       RIGHT ATRIUM           Index LA diam:      3.10 cm 1.69 cm/m  RA Area:     11.20 cm LA Vol (A2C): 26.8 ml 14.61 ml/m RA Volume:   21.90 ml  11.94 ml/m LA Vol (A4C): 39.2 ml 21.36 ml/m  AORTIC VALVE AV Area (Vmax):    2.64 cm AV Area (Vmean):   2.59 cm AV Area (VTI):     2.77 cm AV Vmax:           129.00 cm/s AV Vmean:          86.700 cm/s AV VTI:  0.248 m AV Peak Grad:      6.7 mmHg AV Mean Grad:      3.0 mmHg LVOT Vmax:         120.00 cm/s LVOT Vmean:        79.200 cm/s LVOT VTI:          0.242 m LVOT/AV VTI ratio: 0.98  AORTA Ao Root diam: 2.50 cm Ao Asc diam:  3.30 cm MITRAL VALVE MV Area (PHT): 3.60 cm     SHUNTS MV Decel Time: 211 msec     Systemic VTI:  0.24 m MV E velocity: 75.30 cm/s   Systemic Diam: 1.90 cm  MV A velocity: 107.00 cm/s MV E/A ratio:  0.70 Epifanio Lesches MD Electronically signed by Epifanio Lesches MD Signature Date/Time: 05/11/2021/5:28:15 PM    Final    CT HEAD CODE STROKE WO CONTRAST  Result Date: 05/11/2021 CLINICAL DATA:  Code stroke. Left-sided weakness, nausea, and vomiting. EXAM: CT HEAD WITHOUT CONTRAST TECHNIQUE: Contiguous axial images were obtained from the base of the skull through the vertex without intravenous contrast. COMPARISON:  None. FINDINGS: Brain: An acute parenchymal hemorrhage in the high posterior right frontal lobe measures 4.2 x 2.3 x 3.1 cm (approximate volume of 15 mL). There is mild surrounding edema without midline shift, and there is a small amount of adjacent subarachnoid hemorrhage. A very small amount of coexistent subdural hemorrhage is also not excluded in this region. No acute infarct is identified elsewhere. The ventricles are normal in size. Hypodensities in the cerebral white matter bilaterally are nonspecific but compatible with mild chronic small vessel ischemic disease. Vascular: No hyperdense vessel. Skull: No fracture or suspicious osseous lesion. Sinuses/Orbits: Small osteoma in the left frontal sinus. Mild right and moderate left maxillary sinus mucosal thickening. Clear mastoid air cells. Unremarkable orbits. Other: None. ASPECTS [place] Day Stroke Program Early CT Score) Not scored in the presence of acute hemorrhage. IMPRESSION: Acute parenchymal hemorrhage in the posterior right frontal lobe with small amount of adjacent subarachnoid hemorrhage. These results were communicated to Dr. Iver Nestle at 9:08 am on 05/11/2021 by text page via the [facility] messaging system. Electronically Signed   By: Sebastian Ache M.D.   On: 05/11/2021 09:12   CT ANGIO HEAD NECK W WO CM (CODE STROKE)  Result Date: 05/11/2021 CLINICAL DATA:  Stroke follow-up.  Left-sided weakness. EXAM: CT ANGIOGRAPHY HEAD AND NECK TECHNIQUE: Multidetector CT imaging of the head and neck  was performed using the standard protocol during bolus administration of intravenous contrast. Multiplanar CT image reconstructions and MIPs were obtained to evaluate the vascular anatomy. Carotid stenosis measurements (when applicable) are obtained utilizing NASCET criteria, using the distal internal carotid diameter as the denominator. CONTRAST:  50mL OMNIPAQUE IOHEXOL 350 MG/ML SOLN COMPARISON:  Same day CT head. FINDINGS: CTA NECK FINDINGS Aortic arch: Great vessel origins are patent. Right carotid system: No evidence of dissection, stenosis (50% or greater) or occlusion. Mild apparent irregularity of the ICA at the skull base in a region of streak artifact. Left carotid system: No evidence of dissection, stenosis (50% or greater) or occlusion. Mild apparent irregularity of the ICA at the skull base in a region of streak artifact. Vertebral arteries: Codominant. No evidence of dissection, stenosis (50% or greater) or occlusion. Skeleton: Moderate degenerative disc disease at at C5-C6 and C6-C7 with disc height loss, endplate sclerosis and posterior disc osteophyte complexes. Other neck: No acute abnormality Upper chest: Visualized lung apices are clear. Review of the MIP images confirms the above findings  CTA HEAD FINDINGS Anterior circulation: No large vessel occlusion or proximal hemodynamically significant stenosis. Evaluation of the distal vasculature is limited due to venous contamination. No evidence of and arteriovenous malformation or aneurysm in the region of intraparenchymal hemorrhage, although acute blood products limit evaluation. Posterior circulation: No large vessel occlusion or proximal hemodynamically significant stenosis. Venous sinuses: The superior sagittal sinus is narrowed in the region of hemorrhage, most likely from mass effect. Small right transverse and sigmoid sinuses. Review of the MIP images confirms the above findings IMPRESSION: CTA Head: 1. No large vessel occlusion or  hemodynamically significant proximal arterial stenosis. 2. The superior sagittal sinus is narrowed in the region of hemorrhage with poorly visualized draining cortical veins in this region. While indeterminate, this may be secondary to mass effect given no definite/clear intraluminal thrombus. Given these findings and the location of hemorrhage; however, recommend low threshold for follow-up CTV or MRI with contrast to ensure stability and exclude worsening thrombosis. 3. No evidence of and arteriovenous malformation or aneurysm in the region of intraparenchymal hemorrhage, although acute blood products limit evaluation. Follow-up imaging after resolution of hemorrhage could provide further assessment if clinically indicated. CTA Neck: 1. No significant (greater than 50%) stenosis. 2. Mild apparent irregularity of bilateral ICAs at the skull base is favored artifactual given streak artifact from dental amalgam in this region. Fibromuscular dysplasia is a differential consideration. Findings and recommendations discussed with Dr. Iver Nestle via telephone at 9:59 a.m. Electronically Signed   By: Feliberto Harts MD   On: 05/11/2021 10:09    Assessment/Plan: Diagnosis: 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   Stroke: Continue secondary stroke prophylaxis and Risk Factor Modification listed below:   Blood Pressure Management:  Continue current medication with prn's with permisive HTN per primary team Diabetes management:   Left sided hemiparesis: fit for orthosis to prevent contractures (resting hand splint for day, wrist cock up splint at night, PRAFO, etc) PT/OT for mobility, ADL training  Motor recovery: Fluoxetine Labs and images (see above) independently reviewed.  Records reviewed and summated above.  1. Does the need for close, 24 hr/day medical supervision in concert with the patient's rehab needs make it unreasonable for this patient to be served in a less intensive setting? Yes   2. Co-Morbidities requiring supervision/potential complications: cognitive deficits with behavioral changes--in process of being worked up for Alzheimer's dementia, OSA (monitor for daytime somnolence and headaches),OA (ensure pain does not limit therapies), hypokalemia (continue to monitor and replete as necessary), leukocytosis (repeat labs, cont to monitor for signs and symptoms of infection, further workup if indicated) 3. Due to bladder management, bowel management, safety, skin/wound care, disease management, medication administration, pain management and patient education, does the patient require 24 hr/day rehab nursing? Yes 4. Does the patient require coordinated care of a physician, rehab nurse, therapy disciplines of PT/OT/SLP to address physical and functional deficits in the context of the above medical diagnosis(es)? Yes Addressing deficits in the following areas: balance, endurance, locomotion, strength, transferring, bathing, dressing, toileting, cognition, speech, language, swallowing and psychosocial support 5. Can the patient actively participate in an intensive therapy program of at least 3 hrs of therapy per day at least 5 days per week? Yes 6. The potential for patient to make measurable gains while on inpatient rehab is excellent 7. Anticipated functional outcomes upon discharge from inpatient rehab are min assist  with PT, min assist with OT, supervision and min assist with SLP. 8. Estimated rehab length of stay to reach the above  functional goals is: 27-30 days. 9. Anticipated discharge destination: TBD 10. Overall Rehab/Functional Prognosis: fair  RECOMMENDATIONS: This patient's condition is appropriate for continued rehabilitative care in the following setting: CIR adequate caregiver support available upon discharge Patient has agreed to participate in recommended program. Potentially Note that insurance prior authorization may be required for reimbursement for recommended  care.  Comment: Rehab Admissions Coordinator to follow up.  I have personally performed a face to face diagnostic evaluation, including, but not limited to relevant history and physical exam findings, of this patient and developed relevant assessment and plan.  Additionally, I have reviewed and concur with the physician assistant's documentation above.   Maryla Morrow, MD, ABPMR Jacquelynn Cree, PA-C 05/12/2021

## 2021-05-12 NOTE — Evaluation (Signed)
Occupational Therapy Evaluation Patient Details Name: Donald [name] MRN: [id number] DOB: 1956-01-30 Today's Date: 05/12/2021    History of Present Illness Pt is a 65 y.o. M who presents with right gaze and left hemiplegia. 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 Significant PMH: memory deficits, OSA.   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 Pt currently, pt limited by expressive communication deficits, decreased ability to care for self,  decreased activity tolerance with L sided weakness and poor coordination. When OT enterring room, head turned L; gaze right. Pt pushing to R toward strong side and significant cues required for lean to L. Pt difficulty achieving midline. Pt set-upA to maxA for ADL and modA to maxA+2 for stability. Pt greatly requires intensive OT skilled services. Pt would benefit from continued OT skilled services. OT following acutely.    Follow Up Recommendations  CIR    Equipment Recommendations  3 in 1 bedside commode    Recommendations for Other Services Rehab consult     Precautions / Restrictions Precautions Precautions: Fall Precaution Comments: L hemiparesis, L neglect, extensor tone, BP < 140 Restrictions Weight Bearing Restrictions: No      Mobility Bed Mobility Overal bed mobility: Needs Assistance Bed Mobility: Rolling;Sidelying to Sit;Sit to Supine Rolling: Min guard Sidelying to sit: Mod assist;+2 for physical assistance   Sit to supine: Mod assist;+2 for physical assistance   General bed mobility comments: Min guard for rolling to left, cues for use of bed rail, modA + 2 for LLE and trunk management.    Transfers Overall transfer level: Needs assistance Equipment used: None Transfers: Sit to/from Stand Sit to Stand: Max assist;+2 physical assistance         General transfer comment: MaxA + 2 to stand x 2 from edge of bed, left knee block; pt unable to take  steps to [facility]    Balance Overall balance assessment: Needs assistance Sitting-balance support: Feet supported;Single extremity supported Sitting balance-Leahy Scale: Poor Sitting balance - Comments: Pt pushing to R toward strong side and significant cues required for lean to L. Pt difficulty achieving midline. Postural control: Posterior lean;Right lateral lean                                 ADL either performed or assessed with clinical judgement   ADL Overall ADL's : Needs assistance/impaired Eating/Feeding: NPO   Grooming: Minimal assistance;Sitting;Bed level   Upper Body Bathing: Moderate assistance   Lower Body Bathing: Maximal assistance;Sitting/lateral leans;Sit to/from stand;Bed level   Upper Body Dressing : Moderate assistance;Sitting;Standing;Bed level   Lower Body Dressing: Maximal assistance;Sitting/lateral leans;Sit to/from stand;Bed level;Cueing for safety   Toilet Transfer: Maximal assistance;+2 for physical assistance;+2 for safety/equipment;BSC Toilet Transfer Details (indicate cue type and reason): simulated for sit to stand, but requires near totalA to move feet Toileting- Clothing Manipulation and Hygiene: Maximal assistance;+2 for physical assistance;Sitting/lateral lean;Sit to/from stand       Functional mobility during ADLs: Maximal assistance;+2 for physical assistance;+2 for safety/equipment;Rolling walker;Cueing for safety (multiple sit to stands with PT; L knee blocked) General ADL Comments: Pt limited by expressive communication deficits, decreased ability to care for self,  decreased activity tolerance with L sided weakness and poor coordination.     Vision Baseline Vision/History: No visual deficits Vision Assessment?: Vision impaired- to be further tested in functional context;Yes Eye Alignment: Impaired (comment) Ocular Range  of Motion: Impaired-to be further tested in functional context;Restricted on the right Alignment/Gaze  Preference: Gaze right;Head turned (head turned L; gaze right) Tracking/Visual Pursuits: Impaired - to be further tested in functional context     Perception     Praxis      Pertinent Vitals/Pain Pain Assessment: 0-10 Faces Pain Scale: Hurts a little bit Pain Location: right side of neck Pain Descriptors / Indicators: Discomfort Pain Intervention(s): Monitored during session;Premedicated before session;Repositioned     Hand Dominance Right   Extremity/Trunk Assessment Upper Extremity Assessment Upper Extremity Assessment: Generalized weakness;RUE deficits/detail;LUE deficits/detail RUE Deficits / Details: AROM, WFLs; strength 5/5 LUE Deficits / Details: strength 0/5 MM grade; increased tone noted in elbow flex/ext; numbness tingling in entire arm LUE Coordination: decreased fine motor;decreased gross motor   Lower Extremity Assessment Lower Extremity Assessment: Defer to PT evaluation;LLE deficits/detail LLE Deficits / Details: no active movement   Cervical / Trunk Assessment Cervical / Trunk Assessment: Normal   Communication Communication Communication: Expressive difficulties   Cognition Arousal/Alertness: Awake/alert Behavior During Therapy: Flat affect Overall Cognitive Status: History of cognitive impairments - at baseline Area of Impairment: Orientation;Safety/judgement;Awareness;Problem solving;Following commands;Attention                 Orientation Level: Disoriented to;Time;Situation Current Attention Level: Sustained   Following Commands: Follows one step commands inconsistently Safety/Judgement: Decreased awareness of safety;Decreased awareness of deficits Awareness: Intellectual Problem Solving: Requires verbal cues General Comments: Pt with expressive aphasia; able to state name and birthday. Reported month was "may," and unable to state year. When asked what brought him in, pt stating, "my brain." When asked how he did with therapy, pt stating,  "wonderful." Decreased awareness of safety/deficits. Pt with history of short term memory deficits; was in process of getting diagnosis for dementia PTA. Following > 75% of 1 step commands.   General Comments  Pt's VSS on RA.    Exercises     Shoulder Instructions      Home Living Family/patient expects to be discharged to:: Private residence Living Arrangements: Spouse/significant other;Other (Comment) (parents) Available Help at Discharge: Family;Available 24 hours/day Type of Home: House Home Access: Stairs to enter [employer] of Steps: 4 Entrance Stairs-Rails: Left Home Layout: One level     Bathroom Shower/Tub: Producer, television/film/video: Standard     Home Equipment: Shower seat - built in;Grab bars - toilet;Grab bars - tub/shower   Additional Comments: has access to RW from pt's MIL  Lives With: Spouse    Prior Functioning/Environment Level of Independence: Independent        Comments: part-time Education officer, 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        OT Problem List: Decreased strength;Decreased activity tolerance;Impaired balance (sitting and/or standing);Impaired vision/perception;Decreased coordination;Decreased cognition;Decreased safety awareness;Impaired UE functional use;Increased edema;Pain      OT Treatment/Interventions: Self-care/ADL training;Therapeutic exercise;Neuromuscular education;Energy conservation;DME and/or AE instruction;Therapeutic activities;Cognitive remediation/compensation;Visual/perceptual remediation/compensation;Patient/family education;Balance training    OT Goals(Current goals can be found in the care plan section) Acute Rehab OT Goals Patient Stated Goal: pt wife would like him to return to baseline OT Goal Formulation: With patient/family Time For Goal Achievement: 05/26/21 Potential to Achieve Goals: Good ADL Goals Pt Will Perform Grooming: with supervision;sitting Pt Will  Transfer to Toilet: with mod assist;squat pivot transfer;with +2 assist;bedside commode Pt/caregiver will Perform Home Exercise Program: Increased strength;Left upper extremity;With minimal assist;With written HEP provided Additional ADL Goal #1: Pt will recall 3 household items  and utilize properly with minimal cues. Additional ADL Goal #2: Pt will perform bed mobility with modA for proper sequencing. Additional ADL Goal #3: Pt will perform ADL in sitting with minA for dynamic sitting balance to increase as precursor for ADL.  OT Frequency: Min 2X/week   Barriers to D/C:            Co-evaluation PT/OT/SLP Co-Evaluation/Treatment: Yes Reason for Co-Treatment: Necessary to address cognition/behavior during functional activity;To address functional/ADL transfers   OT goals addressed during session: ADL's and self-care;Strengthening/ROM      AM-PAC OT "6 Clicks" Daily Activity     Outcome Measure Help from another person eating meals?: Total Help from another person taking care of personal grooming?: A Little Help from another person toileting, which includes using toliet, bedpan, or urinal?: A Lot Help from another person bathing (including washing, rinsing, drying)?: A Lot Help from another person to put on and taking off regular upper body clothing?: A Lot Help from another person to put on and taking off regular lower body clothing?: A Lot 6 Click Score: 12   End of Session Equipment Utilized During Treatment: Gait belt Nurse Communication: Mobility status  Activity Tolerance: Treatment limited secondary to agitation;Treatment limited secondary to medical complications (Comment) Patient left: in bed;with call bell/phone within reach;with bed alarm set;with nursing/sitter in room  OT Visit Diagnosis: Unsteadiness on feet (R26.81);Muscle weakness (generalized) (M62.81);Pain;Hemiplegia and hemiparesis;Other symptoms and signs involving cognitive function;Cognitive communication  deficit (R41.841) Symptoms and signs involving cognitive functions: Nontraumatic SAH Hemiplegia - Right/Left: Left Hemiplegia - dominant/non-dominant: Non-Dominant Hemiplegia - caused by: Nontraumatic SAH Pain - Right/Left: Right Pain - part of body:  (neck)                Time: 5176-1607 OT Time Calculation (min): 37 min Charges:  OT General Charges $OT Visit: 1 Visit OT Evaluation $OT Eval High Complexity: 1 High  Flora Lipps, OTR/L Acute Rehabilitation Services Pager: [phone] Office: [phone]   Darnesha Diloreto C 05/12/2021, 6:13 PM

## 2021-05-12 NOTE — Progress Notes (Signed)
Due to patient condition I was unable to obtain contrasted portion of the brain MRI and the head MRV orders. Pt was grabbing onto the head coil, lifting and twisting his legs and eventually stated that he needed fresh air and wanted to come out. Pt went right back to sleep as soon as we got him back onto his bed. MRI brain wo contrast scanned successfully.

## 2021-05-12 NOTE — Progress Notes (Signed)
Rehab Admissions Coordinator Note:  Patient was screened by Clois Dupes for appropriateness for an Inpatient Acute Rehab Consult per therapy recs.  At this time, we are recommending Inpatient Rehab consult. I will place order per protocol.  Clois Dupes RN MSN 05/12/2021, 11:45 AM  I can be reached at [phone].

## 2021-05-12 NOTE — Evaluation (Signed)
Speech Language Pathology Evaluation Patient Details Name: Donald [name] MRN: [id number] DOB: 1956/06/08 Today's Date: 05/12/2021 Time: 0240-9735 SLP Time Calculation (min) (ACUTE ONLY): 12 min  Problem List:  Patient Active Problem List   Diagnosis Date Noted  . ICH (intracerebral hemorrhage) (HCC) 05/11/2021  . Annual physical exam 04/03/2017   Past Medical History:  Past Medical History:  Diagnosis Date  . Arthritis    Past Surgical History:  Past Surgical History:  Procedure Laterality Date  . APPENDECTOMY     HPI:  Donald [name] is a 65 y.o. male with a medical history significant for dementia with behavioral disturbance, OSA, arthritis who presented to the ED via EMS for evaluation of acute onset of left hemiparesis, right gaze, and left mouth droop. CT acute parenchymal hemorrhage in the posterior right frontal lobe with small amount of adjacent subarachnoid hemorrhage. Per chart documentation at baseline pt cares for himself with some short term memory deficits and had a scheduled lumbar puncture today for outpatient evaluation of possible Alzheimer's Dementia at [facility]. Failed [facility] screen due to facial droop and significant leakage.   Assessment / Plan / Recommendation Clinical Impression  Per PT documentation, pt has a history of memory impairment and was in the process of being evaluated. He demonstrates moderate CN VII impairments with left facial asymmetry, dysarthria marked by reduced intensity and articulatory movement. There were dysfluencies noted with prolongations and hesitations (will assess furhter if baseline dysfluency). Divergent naming in simple categories was moderately impaired as was intellectual awareness. 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 He had difficulty with mental calculation with subtraction. Recommend ST on acute care and on CIR.    SLP Assessment  SLP Recommendation/Assessment: Patient needs continued Speech  [place] Pathology Services SLP Visit Diagnosis: Cognitive communication deficit (R41.841);Dysarthria and anarthria (R47.1)    Follow Up Recommendations  Inpatient Rehab    Frequency and Duration min 2x/week  2 weeks      SLP Evaluation Cognition  Overall Cognitive Status:  (? has memory impairments- not certain if presentation is baseline) Arousal/Alertness: Awake/alert Orientation Level: Oriented to person;Disoriented to time;Disoriented to situation;Oriented to place Attention: Sustained Sustained Attention: Impaired Sustained Attention Impairment: Verbal basic;Functional basic Memory:  (hx of impairments- will assess further) Awareness: Impaired Awareness Impairment: Intellectual impairment;Emergent impairment;Anticipatory impairment Problem Solving: Impaired Problem Solving Impairment: Verbal basic;Functional basic Safety/Judgment: Impaired       Comprehension  Auditory Comprehension Overall Auditory Comprehension: Appears within functional limits for tasks assessed Commands: Within Functional Limits (for one step) Interfering Components: Attention;Processing speed;Working Theatre manager: Not tested Reading Comprehension Reading Status:  (TBA)    Expression Expression Primary Mode of Expression: Verbal Verbal Expression Overall Verbal Expression: Impaired Initiation: Impaired (mild dysfluency) Pragmatics: Impairment Impairments: Eye contact Interfering Components: Attention Written Expression Dominant Hand: Right Written Expression:  (TBA)   Oral / Motor  Oral Motor/Sensory Function Overall Oral Motor/Sensory Function: Moderate impairment Facial ROM: Reduced left;Suspected CN VII (facial) dysfunction Facial Symmetry: Abnormal symmetry left;Suspected CN VII (facial) dysfunction Facial Strength: Reduced left;Suspected CN VII (facial) dysfunction Lingual Symmetry: Within Functional Limits Lingual Strength:  Reduced;Suspected CN XII (hypoglossal) dysfunction Velum: Within Functional Limits Mandible: Within Functional Limits Motor Speech Overall Motor Speech: Impaired Respiration: Within functional limits Phonation: Low vocal intensity Resonance: Within functional limits Articulation: Impaired Level of Impairment: Sentence Intelligibility: Intelligibility reduced Word: 75-100% accurate Phrase: 75-100% accurate Sentence: 75-100% accurate Conversation: 50-74% accurate Motor Planning: Witnin functional limits   GO  Donald [name] 05/12/2021, 3:39 PM   Donald [name] Donald [name].Ed Nurse, children's [phone] Office [phone]

## 2021-05-12 NOTE — Progress Notes (Signed)
  Speech Language Pathology Treatment: Dysphagia  Patient Details Name: Donald [name] MRN: [id number] DOB: 02-01-1956 Today's Date: 05/12/2021 Time: 8250-5397 SLP Time Calculation (min) (ACUTE ONLY): 17 min  Assessment / Plan / Recommendation Clinical Impression  Level of alertness improved from yesterday with trials of thin, puree and solid. Significant facial weakness, decreased sensation with mild aggregation of solid and delayed transit. Localized cup at midline to consume sequential straw sips over multiple trials. Swallow initiation appeared coordinated and timely from subjective view. He masticated medium sized pill with RN given in applesauce with mild particles on left that he cleared with sips thin. Given oral weakness, dysarthria, waxing and waning alertness, recommend puree, thin, pills in applesauce, straws allowed, full supervision and check for left sided pocketing. SLP will observe closely for modifications if needed.    HPI HPI: Donald [name] is a 65 y.o. male with a medical history significant for dementia with behavioral disturbance, OSA, arthritis who presented to the ED via EMS for evaluation of acute onset of left hemiparesis, right gaze, and left mouth droop. CT acute parenchymal hemorrhage in the posterior right frontal lobe with small amount of adjacent subarachnoid hemorrhage. Per chart documentation at baseline pt cares for himself with some short term memory deficits and had a scheduled lumbar puncture today for outpatient evaluation of possible Alzheimer's Dementia at [facility]. Failed [facility] screen due to facial droop and significant leakage.      SLP Plan  Continue with current plan of care       Recommendations  Diet recommendations: Dysphagia 1 (puree);Thin liquid Liquids provided via: Cup;Straw Medication Administration: Whole meds with puree Supervision: Staff to assist with self feeding;Full supervision/cueing for compensatory strategies Compensations:  Minimize environmental distractions;Slow rate;Small sips/bites;Lingual sweep for clearance of pocketing Postural Changes and/or Swallow Maneuvers: Seated upright 90 degrees                General recommendations: Rehab consult Oral Care Recommendations: Oral care BID Follow up Recommendations: Inpatient Rehab SLP Visit Diagnosis: Dysphagia, unspecified (R13.10) Plan: Continue with current plan of care                      [name] 05/12/2021, 3:06 PM  Donald [name] [name].Ed Nurse, children's [phone] Office [phone]

## 2021-05-13 ENCOUNTER — Encounter (HOSPITAL_COMMUNITY): Payer: Self-pay | Admitting: Neurology

## 2021-05-13 DIAGNOSIS — I10 Essential (primary) hypertension: Secondary | ICD-10-CM

## 2021-05-13 LAB — BASIC METABOLIC PANEL
Anion gap: 11 (ref 5–15)
BUN: 9 mg/dL (ref 8–23)
CO2: 25 mmol/L (ref 22–32)
Calcium: 9.5 mg/dL (ref 8.9–10.3)
Chloride: 102 mmol/L (ref 98–111)
Creatinine, Ser: 1.12 mg/dL (ref 0.61–1.24)
GFR, Estimated: >60 mL/min (ref >60)
Glucose, Bld: 116 mg/dL — ABNORMAL HIGH (ref 70–99)
Potassium: 3.7 mmol/L (ref 3.5–5.1)
Sodium: 138 mmol/L (ref 135–145)

## 2021-05-13 LAB — CBC
HCT: 47.4 % (ref 39.0–52.0)
Hemoglobin: 15.6 g/dL (ref 13.0–17.0)
MCH: 29.7 pg (ref 26.0–34.0)
MCHC: 32.9 g/dL (ref 30.0–36.0)
MCV: 90.1 fL (ref 80.0–100.0)
Platelets: 233 10*3/uL (ref 150–400)
RBC: 5.26 MIL/uL (ref 4.22–5.81)
RDW: 13.5 % (ref 11.5–15.5)
WBC: 8.1 10*3/uL (ref 4.0–10.5)
nRBC: 0 % (ref 0.0–0.2)

## 2021-05-13 MED ORDER — AMLODIPINE BESYLATE 10 MG PO TABS
10.0000 mg | ORAL_TABLET | Freq: Every day | ORAL | Status: DC
  Administered 2021-05-13 – 2021-05-20 (×8): 10 mg via ORAL
  Filled 2021-05-13 (×8): qty 1

## 2021-05-13 MED ORDER — HEPARIN SODIUM (PORCINE) 5000 UNIT/ML IJ SOLN
5000.0000 [IU] | Freq: Three times a day (TID) | INTRAMUSCULAR | Status: DC
  Administered 2021-05-13 – 2021-05-20 (×22): 5000 [IU] via SUBCUTANEOUS
  Filled 2021-05-13 (×22): qty 1

## 2021-05-13 NOTE — Progress Notes (Addendum)
PROGRESS NOTE  Donald [name]:[id number] DOB: [birth year] DOA: 05/11/2021 PCP: [name], MD   LOS: 2 days   Brief narrative: Donald [name] is a 65 y.o. male with a medical history significant for dementia with behavioral disturbance and reported OSA not on CPAP per wife who presented to the ED via EMS for evaluation of acute onset of left hemiparesis, right gaze, and left mouth droop. Mr. Docter had woken up in his normal state of health this morning. He and his wife had intercourse this morning and when she got up to get ready for the day, she noticed that Mr. Fennell was acting "off". She asked him to get out of bed and he stated that he was unable to move his left side and she noticed that he was looking towards the right so she activated EMS. His initial manual blood pressure at 08:15 was 194/104 and on arrival to the hospital around 09:00 his blood pressure was 122/74. On arrival, Mr. Cefalu was noted to have persistent left hemiparesis, right gaze, neck pain, left mouth droop, and decreased sensation on the left and was immediately taken to CT for stroke evaluation.  As baseline Mr. Mato is able to care for himself at home but has some short term memory deficits and had a scheduled lumbar puncture today for outpatient evaluation of possible Alzheimer's Dementia at [facility]. She also states that he has OSA but does not need CPAP and has an appointment for a mouthguard fitting in the near future. He does not take any medications at home.   Assessment/Plan:  Active Problems:   ICH (intracerebral hemorrhage) (HCC)   Memory deficits   OSA (obstructive sleep apnea)   Generalized OA   Hypokalemia   Leukocytosis  Acute parenchymal hemorrhage in the posterior right frontal lobe with right-sided hemiplegia. Patient was initially admitted to the ICU for closer monitoring.  Focus on blood pressure control with systolic blood pressure goal of 1 20-1 40.  Initially required Cleviprex  and labetalol in the ICU.  Hemoglobin A1c of 5.8. Lipid profile noted with total cholesterol 232 and LDL of 140.  MRI of the brain showed posterior right frontal lobe intra-axial hemorrhage with layering and trace subarachnoid hemorrhage.  Cardiogram showed preserved LV function at 65 to 70%.  Patient has been seen by physical therapy recommended CIR.  Speech therapy has recommended dysphagia 1 diet.  Further recommendations as per neurology.  Statin goal for LDL less than 70.  Obstructive sleep apnea not on CPAP Will monitor closely.  Hypertensive Urgency  Initial blood pressure was 194/ 104.  Patient received IV drip with adequate blood pressure control.  Currently on amlodipine and labetalol.  Closely monitor.  Goal less than 160.  Long-term goal normotensive.  Hyperlipidemia.  On statins.  Continue Lipitor 20 mg daily.  History of cognitive dysfunction and dementia.  Follows up with [facility] neurology as outpatient.  Dysphagia.  On dysphagia 1 diet.  Advance as tolerated as per speech therapy.  Leukocytosis likely reactive.  Trended down.  Hypokalemia improved after replacement.   DVT prophylaxis: heparin injection 5,000 Units Start: 05/13/21 1400 SCD's Start: 05/11/21 0941   Code Status: Full code  Family Communication: None  Status is: Inpatient  Remains inpatient appropriate because:Unsafe d/c plan, IV treatments appropriate due to intensity of illness or inability to take PO, Inpatient level of care appropriate due to severity of illness and Need for rehabilitation   Dispo: The patient is from: Home  Anticipated d/c is to: CIR              Patient currently is not medically stable to d/c.   Difficult to place patient No  Consultants:  Neurology  Procedures:  None  Anti-infectives:  . None  Anti-infectives (From admission, onward)   None    Subjective: Today, patient was seen and examined at bedside.  Patient denies any nausea, vomiting,  headache, shortness of breath.  Objective: Vitals:   05/13/21 0827 05/13/21 1220  BP: 124/86 138/81  Pulse: 81 68  Resp: 18 18  Temp: 98.7 F (37.1 C) 99 F (37.2 C)  SpO2: 100% 100%    Intake/Output Summary (Last 24 hours) at 05/13/2021 1354 Last data filed at 05/13/2021 0900 Gross per 24 hour  Intake 240 ml  Output 450 ml  Net -210 ml   Filed Weights   05/11/21 0800 05/12/21 2230  Weight: 70.4 kg 69.6 kg   Body mass index is 23.33 kg/m.   Physical Exam: GENERAL: Patient is alert awake and oriented. Not in obvious distress. HENT: No scleral pallor or icterus. Pupils equally reactive to light. Oral mucosa is moist NECK: is supple, no gross swelling noted. CHEST: Clear to auscultation. No crackles or wheezes.  Diminished breath sounds bilaterally. CVS: S1 and S2 heard, no murmur. Regular rate and rhythm.  ABDOMEN: Soft, non-tender, bowel sounds are present. EXTREMITIES: Right-sided dense hemiplegia CNS: Right-sided dense hemiplegia, speech okay SKIN: warm and dry without rashes.  Data Review: I have personally reviewed the following laboratory data and studies,  CBC: Recent Labs  Lab 05/11/21 0851 05/11/21 0901 05/12/21 0341 05/13/21 0338  WBC 7.6  --  12.4* 8.1  NEUTROABS 1.7  --   --   --   HGB 14.8 15.0 15.6 15.6  HCT 45.5 44.0 46.2 47.4  MCV 91.4  --  89.4 90.1  PLT 257  --  232 233   Basic Metabolic Panel: Recent Labs  Lab 05/11/21 0851 05/11/21 0901 05/12/21 0341 05/13/21 0338  NA 139 142 138 138  K 3.5 3.5 3.3* 3.7  CL 110 107 100 102  CO2 22  --  25 25  GLUCOSE 156* 154* 139* 116*  BUN 8 11 7* 9  CREATININE 1.18 1.10 0.98 1.12  CALCIUM 8.9  --  9.7 9.5   Liver Function Tests: Recent Labs  Lab 05/11/21 0851 05/12/21 0341  AST 23 24  ALT 17 19  ALKPHOS 94 108  BILITOT 0.7 0.8  PROT 6.7 7.5  ALBUMIN 3.9 4.3   No results for input(s): LIPASE, AMYLASE in the last 168 hours. No results for input(s): AMMONIA in the last 168  hours. Cardiac Enzymes: No results for input(s): CKTOTAL, CKMB, CKMBINDEX, TROPONINI in the last 168 hours. BNP (last 3 results) No results for input(s): BNP in the last 8760 hours.  ProBNP (last 3 results) No results for input(s): PROBNP in the last 8760 hours.  CBG: Recent Labs  Lab 05/11/21 0850  GLUCAP 162*   Recent Results (from the past 240 hour(s))  Resp Panel by RT-PCR (Flu A&B, Covid) Nasopharyngeal Swab     Status: None   Collection Time: 05/11/21  9:56 AM   Specimen: Nasopharyngeal Swab; Nasopharyngeal(NP) swabs in vial transport medium  Result Value Ref Range Status   SARS Coronavirus 2 by RT PCR NEGATIVE NEGATIVE Final    Comment: (NOTE) SARS-CoV-2 target nucleic acids are NOT DETECTED.  The SARS-CoV-2 RNA is generally detectable in upper respiratory specimens during the  acute phase of infection. The lowest concentration of SARS-CoV-2 viral copies this assay can detect is 138 copies/mL. A negative result does not preclude SARS-Cov-2 infection and should not be used as the sole basis for treatment or other patient management decisions. A negative result may occur with  improper specimen collection/handling, submission of specimen other than nasopharyngeal swab, presence of viral mutation(s) within the areas targeted by this assay, and inadequate number of viral copies(<138 copies/mL). A negative result must be combined with clinical observations, patient history, and epidemiological information. The expected result is Negative.  Fact Sheet for Patients:  BloggerCourse.com  Fact Sheet for Healthcare Providers:  SeriousBroker.it  This test is no t yet approved or cleared by the [place] FDA and  has been authorized for detection and/or diagnosis of SARS-CoV-2 by FDA under an Emergency Use Authorization (EUA). This EUA will remain  in effect (meaning this test can be used) for the duration of the COVID-19  declaration under Section 564(b)(1) of the Act, 21 U.S.C.section 360bbb-3(b)(1), unless the authorization is terminated  or revoked sooner.       Influenza A by PCR NEGATIVE NEGATIVE Final   Influenza B by PCR NEGATIVE NEGATIVE Final    Comment: (NOTE) The Xpert Xpress SARS-CoV-2/FLU/RSV plus assay is intended as an aid in the diagnosis of influenza from Nasopharyngeal swab specimens and should not be used as a sole basis for treatment. Nasal washings and aspirates are unacceptable for Xpert Xpress SARS-CoV-2/FLU/RSV testing.  Fact Sheet for Patients: BloggerCourse.com  Fact Sheet for Healthcare Providers: SeriousBroker.it  This test is not yet approved or cleared by the [place] FDA and has been authorized for detection and/or diagnosis of SARS-CoV-2 by FDA under an Emergency Use Authorization (EUA). This EUA will remain in effect (meaning this test can be used) for the duration of the COVID-19 declaration under Section 564(b)(1) of the Act, 21 U.S.C. section 360bbb-3(b)(1), unless the authorization is terminated or revoked.  Performed at [facility], [address] [place], [place] [address]   MRSA PCR Screening     Status: None   Collection Time: 05/11/21 11:05 AM   Specimen: Nasopharyngeal  Result Value Ref Range Status   MRSA by PCR NEGATIVE NEGATIVE Final    Comment:        The GeneXpert MRSA Assay (FDA approved for NASAL specimens only), is one component of a comprehensive MRSA colonization surveillance program. It is not intended to diagnose MRSA infection nor to guide or monitor treatment for MRSA infections. Performed at [facility], [address], [place], [place] [address]      Studies: CT HEAD WO CONTRAST  Result Date: 05/11/2021 CLINICAL DATA:  Stroke.  Intracranial hemorrhage follow-up. EXAM: CT HEAD WITHOUT CONTRAST TECHNIQUE: Contiguous axial images were obtained from the base  of the skull through the vertex without intravenous contrast. COMPARISON:  Same day head CT. FINDINGS: Brain: Mild interval increase in the size of the right frontal intraparenchymal hemorrhage, measuring 4.5 x 2.6 x 3.5 cm (previously 4.2 x 2.3 x 3.1 cm). Similar adjacent small volume subarachnoid hemorrhage. A small amount of subdural hemorrhage in this region is also not excluded. Surrounding edema is similar. Similar local mass effect without midline shift. Basal cisterns are patent. No evidence of acute large vascular territory infarct elsewhere. Similar mild white matter hypodensities, likely chronic microvascular ischemic disease. No hydrocephalus. Vascular: No hyperdense vessel.  Calcific atherosclerosis. Skull: No acute fracture. Sinuses/Orbits: Small left frontal sinus osteoma. Mild right and  moderate left maxillary sinus mucosal thickening. Unremarkable orbits. Other: No mastoid effusions. IMPRESSION: Mild interval increase in the size of the right frontal intraparenchymal hemorrhage, measuring 4.5 x 2.6 x 3.5 cm (previously 4.2 x 2.3 x 3.1 cm). Similar adjacent small volume hemorrhage. Electronically Signed   By: Feliberto HartsFrederick S Jones MD   On: 05/11/2021 17:05   MR BRAIN WO CONTRAST  Result Date: 05/12/2021 CLINICAL DATA:  65 year old male code stroke presentation with intra-axial right hemisphere hemorrhage. Hypertensive (194/104) on presentation. EXAM: MRI HEAD WITHOUT CONTRAST TECHNIQUE: Multiplanar, multiecho pulse sequences of the brain and surrounding structures were obtained without intravenous contrast. COMPARISON:  CT head CTA head and neck 05/11/2021 FINDINGS: Brain: Coronal T2 weighted imaging could not be obtained. And some of the exam is intermittently degraded by motion artifact despite repeated imaging attempts. T1 isointense intra-axial hemorrhage in the posterior right frontal lobe demonstrates a layering hematocrit level (series 13, image 19) and blood encompasses 43 by 38 x 33 mm (AP by  transverse by CC) for an estimated volume of 26 mL. Regional edema. Mild regional mass effect. Trace subarachnoid hemorrhage also suspected on axial FLAIR imaging. Additionally, on SWI there is also chronic hemosiderin in the right parietal lobe on series 19 image 34. But no other definite chronic blood products. Susceptibility related abnormal diffusion in the posterior right frontal lobe with no convincing larger area of restricted diffusion. No restricted diffusion elsewhere. No IVH or ventriculomegaly. Normal basilar cisterns. Negative pituitary and cervicomedullary junction. Wallace CullensGray and white matter signal outside of the affected right hemisphere is largely normal for age with mild nonspecific white matter changes. Vascular: Major intracranial vascular flow voids are preserved. Skull and upper cervical spine: Visualized bone marrow signal is within normal limits. Grossly negative cervical spine. Sinuses/Orbits: Negative orbits. Mild to moderate maxillary sinus mucosal thickening. Other: Mastoids are clear. Grossly normal visible internal auditory structures. IMPRESSION: 1. Posterior right frontal lobe intra-axial hemorrhage with layering hematocrit level has not significantly changed (estimated blood volume of 26 mL). Surrounding edema and mild regional mass effect. Trace subarachnoid hemorrhage. 2. No larger underlying infarct is evident. But chronic hemosiderin in the right parietal lobe indicates a previous intra-axial hemorrhage in the region. 3. No other acute intracranial abnormality. Electronically Signed   By: Odessa FlemingH  Hall M.D.   On: 05/12/2021 07:26   MR MRV HEAD W WO CONTRAST  Result Date: 05/12/2021 CLINICAL DATA:  Intracranial hemorrhage. EXAM: MR VENOGRAM HEAD WITHOUT AND WITH CONTRAST TECHNIQUE: Angiographic images of the intracranial venous structures were acquired using MRV technique without and with intravenous contrast. CONTRAST:  7mL GADAVIST GADOBUTROL 1 MMOL/ML IV SOLN COMPARISON:  Brain MRI  performed earlier today 05/12/2021. Noncontrast head CT examinations 05/11/2021. CT angiogram head/neck 05/11/2021. FINDINGS: The superior sagittal sinus, internal cerebral veins, vein of Galen, straight sinus, transverse sinuses, sigmoid sinuses and visualized jugular veins are patent. No appreciable intracranial venous thrombosis. Hypoplastic right transverse and sigmoid dural venous sinuses. An acute parenchymal hemorrhage within the posterior right frontal lobe is grossly unchanged in size as compared to the brain MRI performed earlier today. There is mild curvilinear vascular enhancement overlying the site of hemorrhage, which may be reactive. Elsewhere, no abnormal intracranial enhancement is identified. IMPRESSION: No evidence of intracranial venous thrombosis. Non dominant right transverse and sigmoid dural venous sinuses. Electronically Signed   By: Jackey LogeKyle  Golden DO   On: 05/12/2021 11:27   ECHOCARDIOGRAM COMPLETE  Result Date: 05/11/2021    ECHOCARDIOGRAM REPORT   [facility]   [facility]  Date of Exam: 05/11/2021 Medical Rec #:  [id number]        Height:       68.0 in Accession #:    [id number]       Weight:       155.2 lb Date of Birth:  10/14/1956         BSA:          1.835 m Patient Age:    65 years         BP:           115/77 mmHg Patient Gender: M                HR:           84 bpm. Exam Location:  Inpatient Procedure: 2D Echo, Cardiac Doppler and Color Doppler Indications:    Stroke I63.9  History:        Patient has no prior history of Echocardiogram examinations.  Sonographer:    Roosvelt Maser RDCS Referring Phys: [id number] Reyne Dumas [facility] IMPRESSIONS  1. Left ventricular ejection fraction, by estimation, is 65 to 70%. The left ventricle has normal function. The left ventricle has no regional wall motion abnormalities. Left ventricular diastolic parameters were normal.  2. Right ventricular systolic function is normal. The right ventricular size is normal. Tricuspid regurgitation signal is  inadequate for assessing PA pressure.  3. The mitral valve is normal in structure. No evidence of mitral valve regurgitation.  4. The aortic valve was not well visualized. Aortic valve regurgitation is not visualized. No aortic stenosis is present.  5. The inferior vena cava is normal in size with greater than 50% respiratory variability, suggesting right atrial pressure of 3 mmHg. FINDINGS  Left Ventricle: Left ventricular ejection fraction, by estimation, is 65 to 70%. The left ventricle has normal function. The left ventricle has no regional wall motion abnormalities. The left ventricular internal cavity size was normal in size. There is  no left ventricular hypertrophy. Left ventricular diastolic parameters were normal. Right Ventricle: The right ventricular size is normal. No increase in right ventricular wall thickness. Right ventricular systolic function is normal. Tricuspid regurgitation signal is inadequate for assessing PA pressure. Left Atrium: Left atrial size was normal in size. Right Atrium: Right atrial size was normal in size. Pericardium: There is no evidence of pericardial effusion. Mitral Valve: The mitral valve is normal in structure. No evidence of mitral valve regurgitation. Tricuspid Valve: The tricuspid valve is normal in structure. Tricuspid valve regurgitation is not demonstrated. Aortic Valve: The aortic valve was not well visualized. Aortic valve regurgitation is not visualized. No aortic stenosis is present. Aortic valve mean gradient measures 3.0 mmHg. Aortic valve peak gradient measures 6.7 mmHg. Aortic valve area, by VTI measures 2.77 cm. Pulmonic Valve: The pulmonic valve was not well visualized. Pulmonic valve regurgitation is not visualized. Aorta: The aortic root and ascending aorta are structurally normal, with no evidence of dilitation. Venous: The inferior vena cava is normal in size with greater than 50% respiratory variability, suggesting right atrial pressure of 3 mmHg.  IAS/Shunts: The interatrial septum was not well visualized.  LEFT VENTRICLE PLAX 2D LVIDd:         4.90 cm  Diastology LVIDs:         2.80 cm  LV e' medial:    8.38 cm/s LV PW:         0.80 cm  LV E/e' medial:  9.0 LV IVS:  0.90 cm  LV e' lateral:   9.14 cm/s LVOT diam:     1.90 cm  LV E/e' lateral: 8.2 LV SV:         69 LV SV Index:   37 LVOT Area:     2.84 cm  RIGHT VENTRICLE RV Basal diam:  2.90 cm LEFT ATRIUM           Index       RIGHT ATRIUM           Index LA diam:      3.10 cm 1.69 cm/m  RA Area:     11.20 cm LA Vol (A2C): 26.8 ml 14.61 ml/m RA Volume:   21.90 ml  11.94 ml/m LA Vol (A4C): 39.2 ml 21.36 ml/m  AORTIC VALVE AV Area (Vmax):    2.64 cm AV Area (Vmean):   2.59 cm AV Area (VTI):     2.77 cm AV Vmax:           129.00 cm/s AV Vmean:          86.700 cm/s AV VTI:            0.248 m AV Peak Grad:      6.7 mmHg AV Mean Grad:      3.0 mmHg LVOT Vmax:         120.00 cm/s LVOT Vmean:        79.200 cm/s LVOT VTI:          0.242 m LVOT/AV VTI ratio: 0.98  AORTA Ao Root diam: 2.50 cm Ao Asc diam:  3.30 cm MITRAL VALVE MV Area (PHT): 3.60 cm     SHUNTS MV Decel Time: 211 msec     Systemic VTI:  0.24 m MV E velocity: 75.30 cm/s   Systemic Diam: 1.90 cm MV A velocity: 107.00 cm/s MV E/A ratio:  0.70 Epifanio Lesches MD Electronically signed by Epifanio Lesches MD Signature Date/Time: 05/11/2021/5:28:15 PM    Final     Joycelyn Das, MD  [facility] 05/13/2021  If 7PM-7AM, please contact night-coverage

## 2021-05-13 NOTE — Progress Notes (Signed)
  Speech Language Pathology Treatment:  (family education)  Patient Details Name: Donald [name] MRN: [id number] DOB: 04-16-1956 Today's Date: 05/13/2021 Time: 6195-0932 SLP Time Calculation (min) (ACUTE ONLY): 16 min  Assessment / Plan / Recommendation Clinical Impression  Upon arrival pt he was in a deep sleep with wife at bedside and did not attempt to waken. Discussion and education with wife who offered details of pt's history helpful for present impairments. On 12/11/19 pt fell down 13 flights of stairs and had wrist xray, no head CT (she was not present due to Covid restrictions). Even prior to fall, wife noted cognitive changes that gradually worsened. [facility] diagnosed pt with a mild cognitive impairment and also recommended an LP. Prior to this hospitalization pt was independent, working part time as Education officer, environmental, handled his finances, no medications (except vitamins). Presently she feels his speech is slower, denies history of stuttering (SLP noted min occasional dysfluency) and was surprised cognition isn't significantly worse. Therapist reviewed results of cognitive assessment, treatment plan. Educated her on swallow assessments thus far. She agrees that puree texture is appropriate for him at this time.    HPI HPI: Donald [name] is a 65 y.o. male with a medical history significant for dementia with behavioral disturbance, OSA, arthritis who presented to the ED via EMS for evaluation of acute onset of left hemiparesis, right gaze, and left mouth droop. CT acute parenchymal hemorrhage in the posterior right frontal lobe with small amount of adjacent subarachnoid hemorrhage. Per chart documentation at baseline pt cares for himself with some short term memory deficits and had a scheduled lumbar puncture today for outpatient evaluation of possible Alzheimer's Dementia at [facility]. Failed [facility] screen due to facial droop and significant leakage.      SLP Plan  Continue with current plan of care        Recommendations  Diet recommendations: Thin liquid;Dysphagia 1 (puree) Liquids provided via: Cup;Straw Medication Administration: Whole meds with puree Supervision: Staff to assist with self feeding;Full supervision/cueing for compensatory strategies Compensations: Minimize environmental distractions;Slow rate;Small sips/bites;Lingual sweep for clearance of pocketing Postural Changes and/or Swallow Maneuvers: Seated upright 90 degrees                Oral Care Recommendations: Oral care BID Follow up Recommendations: Inpatient Rehab SLP Visit Diagnosis: Cognitive communication deficit (R41.841);Dysarthria and anarthria (R47.1) Plan: Continue with current plan of care       GO                [name] 05/13/2021, 4:22 PM

## 2021-05-13 NOTE — Plan of Care (Signed)
Pt transferred from 4N to 3W31. AXOx3-4. VSS. Pt c/o neck pain. Orders reviewed and acknowledged. Telemetry monitoring in place. Falls, aspiration precautions in place. PRN tylenol given for pain w/ relief. Hot packs given for comfort. Pt's wife called and updated. Condom cath applied and on. SBP goal <160. PRN IV labetolol given this AM x1. Will continue to monitor and follow plan of care.   Problem: Elimination: Goal: Will not experience complications related to urinary retention Outcome: Progressing   Problem: Pain Managment: Goal: General experience of comfort will improve Outcome: Progressing   Problem: Safety: Goal: Ability to remain free from injury will improve Outcome: Progressing   Problem: Intracerebral Hemorrhage Tissue Perfusion: Goal: Complications of Intracerebral Hemorrhage will be minimized Outcome: Progressing

## 2021-05-13 NOTE — Progress Notes (Signed)
Physical Therapy Treatment Patient Details Name: Donald [name] MRN: [id number] DOB: 06/26/1956 Today's Date: 05/13/2021    History of Present Illness Pt is a 65 y.o. M who presents with right gaze and left hemiplegia. 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 Significant PMH: memory deficits, OSA.    PT Comments    Pt demonstrates ability to perform bed mobility and transfers, requiring physical assistance for completion. Pt requires multiple cues to maintain upright posture and to attend to L visual field. Pt tolerates standing for at least 4 minutes and is able to give wife a hug, with physical assistance of 2 persons. Pt unable to step with R LE to initiate stand pivot transfer, requiring physical assistance at the hips for facilitation. Pt demonstrates deficits in strength, endurance, power, balance, and posture. Pt will continue to benefit from acute PT to improve safety and increase independence in functional mobility. Pt will benefit from CIR for aggress PT to improve functional status and independence in mobility.    Follow Up Recommendations  CIR     Equipment Recommendations  3in1 (PT);Wheelchair (measurements PT);Wheelchair cushion (measurements PT);Hospital bed    Recommendations for Other Services       Precautions / Restrictions Precautions Precautions: Fall Precaution Comments: L hemiparesis, L neglect, extensor tone, BP < 140 Restrictions Weight Bearing Restrictions: No    Mobility  Bed Mobility Overal bed mobility: Needs Assistance Bed Mobility: Rolling;Sidelying to Sit Rolling: Min guard Sidelying to sit: Mod assist       General bed mobility comments: mod A to bring LEs over EOB and trunk upright.    Transfers Overall transfer level: Needs assistance Equipment used: None Transfers: Sit to/from [employer] Sit to Stand: Max assist;+2 physical assistance Stand pivot transfers: Max assist;+2 physical assistance;Mod assist  (max A of 1 person; mod A +2 physical assistance)       General transfer comment: attempt 1 max A +1 sit>stand transfer, attempt 2 mod A + 2, L knee block  Ambulation/Gait             General Gait Details: unable   Stairs             Wheelchair Mobility    Modified Rankin (Stroke Patients Only) Modified Rankin (Stroke Patients Only) Pre-Morbid Rankin Score: No symptoms Modified Rankin: Severe disability     Balance Overall balance assessment: Needs assistance Sitting-balance support: Feet supported;Single extremity supported;No upper extremity supported Sitting balance-Leahy Scale: Poor Sitting balance - Comments: Pt pushing from R side, with significant L lateral lean and requires multiple cues to correct. Postural control: Left lateral lean;Posterior lean Standing balance support: During functional activity Standing balance-Leahy Scale: Poor Standing balance comment: Pt requires at least mod A of one person to maintain upright standing posture with multiple cues to correct L lateral lean.                            Cognition Arousal/Alertness: Awake/alert Behavior During Therapy: Flat affect Overall Cognitive Status: History of cognitive impairments - at baseline Area of Impairment: Memory;Following commands;Safety/judgement;Awareness;Problem solving                       Following Commands: Follows one step commands with increased time;Follows one step commands inconsistently Safety/Judgement: Decreased awareness of safety;Decreased awareness of deficits Awareness: Intellectual Problem Solving: Requires verbal cues;Requires tactile cues;Slow processing;Decreased initiation;Difficulty sequencing General Comments: Pt requires multiple cues to  correct upright posture, with tendency to lean to L. Pt reports feeling wonderful when asked how he is feeling during transfers.      Exercises      General Comments General comments (skin  integrity, edema, etc.): Pt BP taken at end of session and found to be 136/85.      Pertinent Vitals/Pain Pain Assessment: Faces Faces Pain Scale: Hurts little more Pain Location: abdominal pain Pain Descriptors / Indicators: Discomfort;Pressure Pain Intervention(s): Monitored during session    Home Living                      Prior Function            PT Goals (current goals can now be found in the care plan section) Acute Rehab PT Goals Patient Stated Goal: Get stronger and go home. Progress towards PT goals: Progressing toward goals    Frequency    Min 4X/week      PT Plan Current plan remains appropriate    Co-evaluation              AM-PAC PT "6 Clicks" Mobility   Outcome Measure  Help needed turning from your back to your side while in a flat bed without using bedrails?: A Little Help needed moving from lying on your back to sitting on the side of a flat bed without using bedrails?: A Lot Help needed moving to and from a bed to a chair (including a wheelchair)?: Total Help needed standing up from a chair using your arms (e.g., wheelchair or bedside chair)?: A Lot Help needed to walk in hospital room?: Total Help needed climbing 3-5 steps with a railing? : Total 6 Click Score: 10    End of Session Equipment Utilized During Treatment: Gait belt Activity Tolerance: Patient tolerated treatment well Patient left: in chair;with call bell/phone within reach;with chair alarm set;with family/visitor present Nurse Communication: Mobility status PT Visit Diagnosis: Unsteadiness on feet (R26.81);Muscle weakness (generalized) (M62.81);Difficulty in walking, not elsewhere classified (R26.2);Hemiplegia and hemiparesis Hemiplegia - Right/Left: Left Hemiplegia - dominant/non-dominant: Non-dominant Hemiplegia - caused by: Nontraumatic intracerebral hemorrhage     Time: 1651-1741 PT Time Calculation (min) (ACUTE ONLY): 50 min  Charges:  $Therapeutic  Activity: 38-52 mins                     Acute Rehab  Pager: [phone]    Waldemar Dickens, SPT  05/13/2021, 6:10 PM

## 2021-05-13 NOTE — Progress Notes (Signed)
Inpatient Rehab Admissions:  Inpatient Rehab Consult received.  I met with patient and his spouse at the bedside for rehabilitation assessment and to discuss goals and expectations of an inpatient rehab admission.  We discussed estimated length of stay to be about 1 month, with goals of supervision to min assist.  Pt's spouse can provide this level of care as she has already been primary caregiver for her father and her mother in the past.  They were supposed to move into a 2nd floor apartment on Saturday, but she has already contacted the leasing office to request a first floor apartment.  I explained insurance auth process for them and I will start that today.    Signed: Shann Medal, PT, DPT Admissions Coordinator [phone] 05/13/21  12:11 PM

## 2021-05-13 NOTE — Progress Notes (Signed)
STROKE TEAM PROGRESS NOTE   SUBJECTIVE (INTERVAL HISTORY) His wife is at the bedside.  Overall his condition is stable. Pt eyes open, denies any HA, BP overnight high and received labetalol PRN. Will put on amlodpine 10. Still has left hemiplegia but right gaze has resolved. MRV neg for CSVT   OBJECTIVE Temp:  [97.6 F (36.4 C)-98.7 F (37.1 C)] 98.7 F (37.1 C) (06/02 0827) Pulse Rate:  [64-92] 81 (06/02 0827) Cardiac Rhythm: Normal sinus rhythm (06/02 0700) Resp:  [11-20] 18 (06/02 0827) BP: (124-163)/(73-103) 124/86 (06/02 0827) SpO2:  [96 %-100 %] 100 % (06/02 0827) Weight:  [69.6 kg] 69.6 kg (06/01 2230)  Recent Labs  Lab 05/11/21 0850  GLUCAP 162*   Recent Labs  Lab 05/11/21 0851 05/11/21 0901 05/12/21 0341 05/13/21 0338  NA 139 142 138 138  K 3.5 3.5 3.3* 3.7  CL 110 107 100 102  CO2 22  --  25 25  GLUCOSE 156* 154* 139* 116*  BUN 8 11 7* 9  CREATININE 1.18 1.10 0.98 1.12  CALCIUM 8.9  --  9.7 9.5   Recent Labs  Lab 05/11/21 0851 05/12/21 0341  AST 23 24  ALT 17 19  ALKPHOS 94 108  BILITOT 0.7 0.8  PROT 6.7 7.5  ALBUMIN 3.9 4.3   Recent Labs  Lab 05/11/21 0851 05/11/21 0901 05/12/21 0341 05/13/21 0338  WBC 7.6  --  12.4* 8.1  NEUTROABS 1.7  --   --   --   HGB 14.8 15.0 15.6 15.6  HCT 45.5 44.0 46.2 47.4  MCV 91.4  --  89.4 90.1  PLT 257  --  232 233   No results for input(s): CKTOTAL, CKMB, CKMBINDEX, TROPONINI in the last 168 hours. Recent Labs    05/11/21 0851  LABPROT 13.4  INR 1.0   Recent Labs    05/11/21 1109  COLORURINE YELLOW  LABSPEC 1.026  PHURINE 6.0  GLUCOSEU NEGATIVE  HGBUR NEGATIVE  BILIRUBINUR NEGATIVE  KETONESUR NEGATIVE  PROTEINUR NEGATIVE  NITRITE NEGATIVE  LEUKOCYTESUR NEGATIVE       Component Value Date/Time   CHOL 232 (H) 05/11/2021 1118   CHOL 204 (H) 09/11/2020 1449   TRIG 73 05/11/2021 1118   HDL 77 05/11/2021 1118   HDL 63 09/11/2020 1449   CHOLHDL 3.0 05/11/2021 1118   VLDL 15 05/11/2021 1118    LDLCALC 140 (H) 05/11/2021 1118   LDLCALC 132 (H) 09/11/2020 1449   LDLCALC 107 (H) 04/29/2020 0826   Lab Results  Component Value Date   HGBA1C 5.8 (H) 05/11/2021      Component Value Date/Time   LABOPIA NONE DETECTED 05/11/2021 1109   COCAINSCRNUR NONE DETECTED 05/11/2021 1109   LABBENZ NONE DETECTED 05/11/2021 1109   AMPHETMU NONE DETECTED 05/11/2021 1109   THCU NONE DETECTED 05/11/2021 1109   LABBARB NONE DETECTED 05/11/2021 1109    Recent Labs  Lab 05/11/21 1118  ETH <10    I have personally reviewed the radiological images below and agree with the radiology interpretations.  CT HEAD WO CONTRAST  Result Date: 05/11/2021 CLINICAL DATA:  Stroke.  Intracranial hemorrhage follow-up. EXAM: CT HEAD WITHOUT CONTRAST TECHNIQUE: Contiguous axial images were obtained from the base of the skull through the vertex without intravenous contrast. COMPARISON:  Same day head CT. FINDINGS: Brain: Mild interval increase in the size of the right frontal intraparenchymal hemorrhage, measuring 4.5 x 2.6 x 3.5 cm (previously 4.2 x 2.3 x 3.1 cm). Similar adjacent small volume subarachnoid hemorrhage. A  small amount of subdural hemorrhage in this region is also not excluded. Surrounding edema is similar. Similar local mass effect without midline shift. Basal cisterns are patent. No evidence of acute large vascular territory infarct elsewhere. Similar mild white matter hypodensities, likely chronic microvascular ischemic disease. No hydrocephalus. Vascular: No hyperdense vessel.  Calcific atherosclerosis. Skull: No acute fracture. Sinuses/Orbits: Small left frontal sinus osteoma. Mild right and moderate left maxillary sinus mucosal thickening. Unremarkable orbits. Other: No mastoid effusions. IMPRESSION: Mild interval increase in the size of the right frontal intraparenchymal hemorrhage, measuring 4.5 x 2.6 x 3.5 cm (previously 4.2 x 2.3 x 3.1 cm). Similar adjacent small volume hemorrhage. Electronically  Signed   By: Feliberto Harts MD   On: 05/11/2021 17:05   MR BRAIN WO CONTRAST  Result Date: 05/12/2021 CLINICAL DATA:  65 year old male code stroke presentation with intra-axial right hemisphere hemorrhage. Hypertensive (194/104) on presentation. EXAM: MRI HEAD WITHOUT CONTRAST TECHNIQUE: Multiplanar, multiecho pulse sequences of the brain and surrounding structures were obtained without intravenous contrast. COMPARISON:  CT head CTA head and neck 05/11/2021 FINDINGS: Brain: Coronal T2 weighted imaging could not be obtained. And some of the exam is intermittently degraded by motion artifact despite repeated imaging attempts. T1 isointense intra-axial hemorrhage in the posterior right frontal lobe demonstrates a layering hematocrit level (series 13, image 19) and blood encompasses 43 by 38 x 33 mm (AP by transverse by CC) for an estimated volume of 26 mL. Regional edema. Mild regional mass effect. Trace subarachnoid hemorrhage also suspected on axial FLAIR imaging. Additionally, on SWI there is also chronic hemosiderin in the right parietal lobe on series 19 image 34. But no other definite chronic blood products. Susceptibility related abnormal diffusion in the posterior right frontal lobe with no convincing larger area of restricted diffusion. No restricted diffusion elsewhere. No IVH or ventriculomegaly. Normal basilar cisterns. Negative pituitary and cervicomedullary junction. Wallace Cullens and white matter signal outside of the affected right hemisphere is largely normal for age with mild nonspecific white matter changes. Vascular: Major intracranial vascular flow voids are preserved. Skull and upper cervical spine: Visualized bone marrow signal is within normal limits. Grossly negative cervical spine. Sinuses/Orbits: Negative orbits. Mild to moderate maxillary sinus mucosal thickening. Other: Mastoids are clear. Grossly normal visible internal auditory structures. IMPRESSION: 1. Posterior right frontal lobe  intra-axial hemorrhage with layering hematocrit level has not significantly changed (estimated blood volume of 26 mL). Surrounding edema and mild regional mass effect. Trace subarachnoid hemorrhage. 2. No larger underlying infarct is evident. But chronic hemosiderin in the right parietal lobe indicates a previous intra-axial hemorrhage in the region. 3. No other acute intracranial abnormality. Electronically Signed   By: Odessa Fleming M.D.   On: 05/12/2021 07:26   MR MRV HEAD W WO CONTRAST  Result Date: 05/12/2021 CLINICAL DATA:  Intracranial hemorrhage. EXAM: MR VENOGRAM HEAD WITHOUT AND WITH CONTRAST TECHNIQUE: Angiographic images of the intracranial venous structures were acquired using MRV technique without and with intravenous contrast. CONTRAST:  7mL GADAVIST GADOBUTROL 1 MMOL/ML IV SOLN COMPARISON:  Brain MRI performed earlier today 05/12/2021. Noncontrast head CT examinations 05/11/2021. CT angiogram head/neck 05/11/2021. FINDINGS: The superior sagittal sinus, internal cerebral veins, vein of Galen, straight sinus, transverse sinuses, sigmoid sinuses and visualized jugular veins are patent. No appreciable intracranial venous thrombosis. Hypoplastic right transverse and sigmoid dural venous sinuses. An acute parenchymal hemorrhage within the posterior right frontal lobe is grossly unchanged in size as compared to the brain MRI performed earlier today. There is mild curvilinear vascular  enhancement overlying the site of hemorrhage, which may be reactive. Elsewhere, no abnormal intracranial enhancement is identified. IMPRESSION: No evidence of intracranial venous thrombosis. Non dominant right transverse and sigmoid dural venous sinuses. Electronically Signed   By: Jackey Loge DO   On: 05/12/2021 11:27   ECHOCARDIOGRAM COMPLETE  Result Date: 05/11/2021    ECHOCARDIOGRAM REPORT   Patient Name:   [facility] Date of Exam: 05/11/2021 Medical Rec #:  [id number]        Height:       68.0 in Accession #:     [id number]       Weight:       155.2 lb Date of Birth:  1956-04-08         BSA:          1.835 m Patient Age:    65 years         BP:           115/77 mmHg Patient Gender: M                HR:           84 bpm. Exam Location:  Inpatient Procedure: 2D Echo, Cardiac Doppler and Color Doppler Indications:    Stroke I63.9  History:        Patient has no prior history of Echocardiogram examinations.  Sonographer:    Roosvelt Maser RDCS Referring Phys: [id number] Reyne Dumas [facility] IMPRESSIONS  1. Left ventricular ejection fraction, by estimation, is 65 to 70%. The left ventricle has normal function. The left ventricle has no regional wall motion abnormalities. Left ventricular diastolic parameters were normal.  2. Right ventricular systolic function is normal. The right ventricular size is normal. Tricuspid regurgitation signal is inadequate for assessing PA pressure.  3. The mitral valve is normal in structure. No evidence of mitral valve regurgitation.  4. The aortic valve was not well visualized. Aortic valve regurgitation is not visualized. No aortic stenosis is present.  5. The inferior vena cava is normal in size with greater than 50% respiratory variability, suggesting right atrial pressure of 3 mmHg. FINDINGS  Left Ventricle: Left ventricular ejection fraction, by estimation, is 65 to 70%. The left ventricle has normal function. The left ventricle has no regional wall motion abnormalities. The left ventricular internal cavity size was normal in size. There is  no left ventricular hypertrophy. Left ventricular diastolic parameters were normal. Right Ventricle: The right ventricular size is normal. No increase in right ventricular wall thickness. Right ventricular systolic function is normal. Tricuspid regurgitation signal is inadequate for assessing PA pressure. Left Atrium: Left atrial size was normal in size. Right Atrium: Right atrial size was normal in size. Pericardium: There is no evidence of pericardial effusion.  Mitral Valve: The mitral valve is normal in structure. No evidence of mitral valve regurgitation. Tricuspid Valve: The tricuspid valve is normal in structure. Tricuspid valve regurgitation is not demonstrated. Aortic Valve: The aortic valve was not well visualized. Aortic valve regurgitation is not visualized. No aortic stenosis is present. Aortic valve mean gradient measures 3.0 mmHg. Aortic valve peak gradient measures 6.7 mmHg. Aortic valve area, by VTI measures 2.77 cm. Pulmonic Valve: The pulmonic valve was not well visualized. Pulmonic valve regurgitation is not visualized. Aorta: The aortic root and ascending aorta are structurally normal, with no evidence of dilitation. Venous: The inferior vena cava is normal in size with greater than 50% respiratory variability, suggesting right atrial pressure of 3 mmHg. IAS/Shunts: The interatrial septum  was not well visualized.  LEFT VENTRICLE PLAX 2D LVIDd:         4.90 cm  Diastology LVIDs:         2.80 cm  LV e' medial:    8.38 cm/s LV PW:         0.80 cm  LV E/e' medial:  9.0 LV IVS:        0.90 cm  LV e' lateral:   9.14 cm/s LVOT diam:     1.90 cm  LV E/e' lateral: 8.2 LV SV:         69 LV SV Index:   37 LVOT Area:     2.84 cm  RIGHT VENTRICLE RV Basal diam:  2.90 cm LEFT ATRIUM           Index       RIGHT ATRIUM           Index LA diam:      3.10 cm 1.69 cm/m  RA Area:     11.20 cm LA Vol (A2C): 26.8 ml 14.61 ml/m RA Volume:   21.90 ml  11.94 ml/m LA Vol (A4C): 39.2 ml 21.36 ml/m  AORTIC VALVE AV Area (Vmax):    2.64 cm AV Area (Vmean):   2.59 cm AV Area (VTI):     2.77 cm AV Vmax:           129.00 cm/s AV Vmean:          86.700 cm/s AV VTI:            0.248 m AV Peak Grad:      6.7 mmHg AV Mean Grad:      3.0 mmHg LVOT Vmax:         120.00 cm/s LVOT Vmean:        79.200 cm/s LVOT VTI:          0.242 m LVOT/AV VTI ratio: 0.98  AORTA Ao Root diam: 2.50 cm Ao Asc diam:  3.30 cm MITRAL VALVE MV Area (PHT): 3.60 cm     SHUNTS MV Decel Time: 211 msec      Systemic VTI:  0.24 m MV E velocity: 75.30 cm/s   Systemic Diam: 1.90 cm MV A velocity: 107.00 cm/s MV E/A ratio:  0.70 Epifanio Lesches MD Electronically signed by Epifanio Lesches MD Signature Date/Time: 05/11/2021/5:28:15 PM    Final    CT HEAD CODE STROKE WO CONTRAST  Result Date: 05/11/2021 CLINICAL DATA:  Code stroke. Left-sided weakness, nausea, and vomiting. EXAM: CT HEAD WITHOUT CONTRAST TECHNIQUE: Contiguous axial images were obtained from the base of the skull through the vertex without intravenous contrast. COMPARISON:  None. FINDINGS: Brain: An acute parenchymal hemorrhage in the high posterior right frontal lobe measures 4.2 x 2.3 x 3.1 cm (approximate volume of 15 mL). There is mild surrounding edema without midline shift, and there is a small amount of adjacent subarachnoid hemorrhage. A very small amount of coexistent subdural hemorrhage is also not excluded in this region. No acute infarct is identified elsewhere. The ventricles are normal in size. Hypodensities in the cerebral white matter bilaterally are nonspecific but compatible with mild chronic small vessel ischemic disease. Vascular: No hyperdense vessel. Skull: No fracture or suspicious osseous lesion. Sinuses/Orbits: Small osteoma in the left frontal sinus. Mild right and moderate left maxillary sinus mucosal thickening. Clear mastoid air cells. Unremarkable orbits. Other: None. ASPECTS [facility] Stroke Program Early CT Score) Not scored in the presence of acute hemorrhage. IMPRESSION: Acute parenchymal hemorrhage  in the posterior right frontal lobe with small amount of adjacent subarachnoid hemorrhage. These results were communicated to Dr. Iver NestleBhagat at 9:08 am on 05/11/2021 by text page via the [employer] messaging system. Electronically Signed   By: Sebastian AcheAllen  Grady M.D.   On: 05/11/2021 09:12   CT ANGIO HEAD NECK W WO CM (CODE STROKE)  Result Date: 05/11/2021 CLINICAL DATA:  Stroke follow-up.  Left-sided weakness. EXAM: CT ANGIOGRAPHY  HEAD AND NECK TECHNIQUE: Multidetector CT imaging of the head and neck was performed using the standard protocol during bolus administration of intravenous contrast. Multiplanar CT image reconstructions and MIPs were obtained to evaluate the vascular anatomy. Carotid stenosis measurements (when applicable) are obtained utilizing NASCET criteria, using the distal internal carotid diameter as the denominator. CONTRAST:  50mL OMNIPAQUE IOHEXOL 350 MG/ML SOLN COMPARISON:  Same day CT head. FINDINGS: CTA NECK FINDINGS Aortic arch: Great vessel origins are patent. Right carotid system: No evidence of dissection, stenosis (50% or greater) or occlusion. Mild apparent irregularity of the ICA at the skull base in a region of streak artifact. Left carotid system: No evidence of dissection, stenosis (50% or greater) or occlusion. Mild apparent irregularity of the ICA at the skull base in a region of streak artifact. Vertebral arteries: Codominant. No evidence of dissection, stenosis (50% or greater) or occlusion. Skeleton: Moderate degenerative disc disease at at C5-C6 and C6-C7 with disc height loss, endplate sclerosis and posterior disc osteophyte complexes. Other neck: No acute abnormality Upper chest: Visualized lung apices are clear. Review of the MIP images confirms the above findings CTA HEAD FINDINGS Anterior circulation: No large vessel occlusion or proximal hemodynamically significant stenosis. Evaluation of the distal vasculature is limited due to venous contamination. No evidence of and arteriovenous malformation or aneurysm in the region of intraparenchymal hemorrhage, although acute blood products limit evaluation. Posterior circulation: No large vessel occlusion or proximal hemodynamically significant stenosis. Venous sinuses: The superior sagittal sinus is narrowed in the region of hemorrhage, most likely from mass effect. Small right transverse and sigmoid sinuses. Review of the MIP images confirms the above  findings IMPRESSION: CTA Head: 1. No large vessel occlusion or hemodynamically significant proximal arterial stenosis. 2. The superior sagittal sinus is narrowed in the region of hemorrhage with poorly visualized draining cortical veins in this region. While indeterminate, this may be secondary to mass effect given no definite/clear intraluminal thrombus. Given these findings and the location of hemorrhage; however, recommend low threshold for follow-up CTV or MRI with contrast to ensure stability and exclude worsening thrombosis. 3. No evidence of and arteriovenous malformation or aneurysm in the region of intraparenchymal hemorrhage, although acute blood products limit evaluation. Follow-up imaging after resolution of hemorrhage could provide further assessment if clinically indicated. CTA Neck: 1. No significant (greater than 50%) stenosis. 2. Mild apparent irregularity of bilateral ICAs at the skull base is favored artifactual given streak artifact from dental amalgam in this region. Fibromuscular dysplasia is a differential consideration. Findings and recommendations discussed with Dr. Iver NestleBhagat via telephone at 9:59 a.m. Electronically Signed   By: Feliberto HartsFrederick S Jones MD   On: 05/11/2021 10:09    PHYSICAL EXAM  Temp:  [97.6 F (36.4 C)-98.7 F (37.1 C)] 98.7 F (37.1 C) (06/02 0827) Pulse Rate:  [64-92] 81 (06/02 0827) Resp:  [11-20] 18 (06/02 0827) BP: (124-163)/(73-103) 124/86 (06/02 0827) SpO2:  [96 %-100 %] 100 % (06/02 0827) Weight:  [69.6 kg] 69.6 kg (06/01 2230)  General - Well nourished, well developed, in no apparent distress.  Ophthalmologic -  fundi not visualized due to noncooperation.  Cardiovascular - Regular rhythm and rate.  Neuro - lethargic but awake, alert, eyes open, orientated to age, place, time and people. No aphasia, paucity of speech, following all simple commands. Able to name and repeat. No significant gaze palsy, visual field full, PERRL. Left facial droop. Tongue  midline. Right UE 5/5 and LE 4/5, LUE and LLE plegic Sensation symmetrical bilaterally subjectively. right FTN intact, gait not tested.     ASSESSMENT/PLAN Donald [name] is a 65 y.o. male with history of demential with behavioral disturbance and OSA on CPAP admitted for right gaze and left hemoplegia, left facial droop. No tPA given due to ICH.    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 No CSVT on venous phase  CT repeat mildly increased left frontal ICH  MRI brain no significant change of ICH since last CT  MRV Non dominant right transverse and sigmoid dural venous sinuses.  May need to repeat CTA head and MRI with and without contrast once hematoma resolves as outpt to rule out underlying source which is masked by current ICH.   2D Echo  EF 65-70%  LDL 140  HgbA1c 5.8  UDS neg  Heparin subq for VTE prophylaxis  No antithrombotic prior to admission, now on No antithrombotic given ICH  Ongoing aggressive stroke risk factor management  Therapy recommendations:  CIR  Disposition:  Pending   ?? CAA  MRI did not show typical CAA pattern but did show some chronic hemosiderin in the right parietal lobe indicates a previous intra-axial hemorrhage in the region.  Lobar ICH without significant hx of HTN concerning for CAA  Pt does have hx of cognitive impairment with behavioral disturbance per wife, especially since 11/2019 after a fall  Against antithrombotic use in the future given concerns of CAA at this time.  Dementia   Has appointment with [facility] for LP and evaluation of dementia, but pt not able to go due to onset of ICH  Delirium precaution  Continue outpt follow up with [facility] neurolog[facility]  HTN . Overnight BP high needed labetalol IV PRN . Off cleviprex now . Put on amlodipine 10 . BP goal < 160  Long term BP goal  normotensive  Hyperlipidemia  Home meds:  none   LDL 140, goal < 70  Will consider low dose statin (lipitor 20) on discharge   Dysphagia   On dys 1 diet with thin liquid  Speech to follow  Other Stroke Risk Factors  Advanced age  Obstructive sleep apnea, on CPAP at home  Other Active Problems  Hypokalemia - K 3.3 - >3.7  Leukocytosis WBC 7.6->12.4 ->8.1   Hospital day # 2   Marvel Plan, MD PhD Stroke Neurology 05/13/2021 12:08 PM    To contact Stroke Continuity provider, please refer to [email or web address]. After hours, contact General Neurology

## 2021-05-14 LAB — CBC
HCT: 47 % (ref 39.0–52.0)
Hemoglobin: 15.5 g/dL (ref 13.0–17.0)
MCH: 29.6 pg (ref 26.0–34.0)
MCHC: 33 g/dL (ref 30.0–36.0)
MCV: 89.7 fL (ref 80.0–100.0)
Platelets: 237 10*3/uL (ref 150–400)
RBC: 5.24 MIL/uL (ref 4.22–5.81)
RDW: 13.8 % (ref 11.5–15.5)
WBC: 7.8 10*3/uL (ref 4.0–10.5)
nRBC: 0 % (ref 0.0–0.2)

## 2021-05-14 LAB — BASIC METABOLIC PANEL
Anion gap: 9 (ref 5–15)
BUN: 10 mg/dL (ref 8–23)
CO2: 24 mmol/L (ref 22–32)
Calcium: 9.5 mg/dL (ref 8.9–10.3)
Chloride: 105 mmol/L (ref 98–111)
Creatinine, Ser: 1.03 mg/dL (ref 0.61–1.24)
GFR, Estimated: >60 mL/min (ref >60)
Glucose, Bld: 108 mg/dL — ABNORMAL HIGH (ref 70–99)
Potassium: 3.8 mmol/L (ref 3.5–5.1)
Sodium: 138 mmol/L (ref 135–145)

## 2021-05-14 LAB — MAGNESIUM: Magnesium: 2.1 mg/dL (ref 1.7–2.4)

## 2021-05-14 LAB — PHOSPHORUS: Phosphorus: 2.9 mg/dL (ref 2.5–4.6)

## 2021-05-14 MED ORDER — HYDROCHLOROTHIAZIDE 12.5 MG PO CAPS
12.5000 mg | ORAL_CAPSULE | Freq: Every day | ORAL | Status: DC
  Administered 2021-05-15 – 2021-05-20 (×6): 12.5 mg via ORAL
  Filled 2021-05-14 (×6): qty 1

## 2021-05-14 MED ORDER — HYDROCHLOROTHIAZIDE 25 MG PO TABS: 25.0000 mg | ORAL_TABLET | Freq: Every day | ORAL | Status: DC

## 2021-05-14 MED ORDER — POLYETHYLENE GLYCOL 3350 17 G PO PACK
17.0000 g | PACK | Freq: Every day | ORAL | Status: DC
  Administered 2021-05-14 – 2021-05-18 (×5): 17 g via ORAL
  Filled 2021-05-14 (×5): qty 1

## 2021-05-14 NOTE — Progress Notes (Addendum)
PROGRESS NOTE  Donald [name] CBJ:[id number] DOB: 06/21/1956 DOA: 05/11/2021 PCP: [name], MD   LOS: 3 days   Brief narrative: Donald [name] is a 65 y.o. male with a medical history significant for dementia with behavioral disturbance and reported OSA not on CPAP per wife who presented to the ED via EMS for evaluation of acute onset of left hemiparesis, right gaze, and left mouth droop. Donald [name] had woken up in his normal state of health this morning. He and his wife had intercourse this morning and when she got up to get ready for the day, she noticed that Donald [name] was acting "off". She asked him to get out of bed and he stated that he was unable to move his left side and she noticed that he was looking towards the right so she activated EMS. His initial manual blood pressure at 08:15 was 194/104 and on arrival to the hospital around 09:00 his blood pressure was 122/74. On arrival, Donald [name] was noted to have persistent left hemiparesis, right gaze, neck pain, left mouth droop, and decreased sensation on the left and was immediately taken to CT for stroke evaluation. As baseline Donald [name] is able to care for himself at home but has some short term memory deficits and had a scheduled lumbar puncture for outpatient evaluation of possible Alzheimer's Dementia at [facility].   Assessment/Plan:  Active Problems:   ICH (intracerebral hemorrhage) (HCC)   Memory deficits   OSA (obstructive sleep apnea)   Generalized OA   Hypokalemia   Leukocytosis  Acute parenchymal hemorrhage in the posterior right frontal lobe with right-sided hemiplegia.  Patient was initially admitted to the ICU for closer monitoring.  Focus on blood pressure control with systolic blood pressure goal of 120-140.  Initially required Cleviprex and labetalol in the ICU.  Hemoglobin A1c of 5.8. Lipid profile noted with total cholesterol 232 and LDL of 140.  MRI of the brain showed posterior right frontal lobe  intra-axial hemorrhage with layering and trace subarachnoid hemorrhage.  2D echocardiogram showed preserved LV function at 65 to 70%.  Patient has been seen by physical therapy recommended CIR.  Speech therapy has recommended dysphagia 1 diet.  Further recommendations as per neurology.  Statin goal for LDL less than 70.  Obstructive sleep apnea not on CPAP Will monitor closely.  Hypertensive Urgency  Initial blood pressure was 194/ 104.  Patient received IV drip with adequate blood pressure control.  Currently on amlodipine and labetalol.  Closely monitor.  Goal less than 160.  We will add HCTZ today due to elevated blood pressure.  Long-term goal normotensive.  Hyperlipidemia.  On statins.  Continue Lipitor 20 mg daily.  History of cognitive dysfunction and dementia.  Follows up with [facility] neurology as outpatient.  He was supposed to have a lumbar puncture as outpatient.  Dysphagia.  On dysphagia 1 diet.  Advance as tolerated as per speech therapy.  Leukocytosis likely reactive.  Normalized at this time.  Hypokalemia improved after replacement.  Latest potassium of 3.8.  We will closely monitor.   DVT prophylaxis: heparin injection 5,000 Units Start: 05/13/21 1400 SCD's Start: 05/11/21 0941   Code Status: Full code  Family Communication: I spoke with the patient's wife on the phone and updated her about the clinical condition of the patient. Status is: Inpatient  Remains inpatient appropriate because: IV treatments appropriate due to intensity of illness or inability to take PO, Inpatient level of care appropriate due to severity of illness and Need  for rehabilitation   Dispo: The patient is from: Home              Anticipated d/c is to: CIR              Patient currently is  medically stable to d/c.   Difficult to place patient No  Consultants:  Neurology  Procedures:  None  Anti-infectives:  . None  Anti-infectives (From admission, onward)   None      Subjective:  Today, patient was seen and examined at bedside.  Patient denies any nausea, vomiting, headache, shortness of breath. Abdominal comfort+ Had no bowel movement in last 2 years.  Objective: Vitals:   05/14/21 1117 05/14/21 1536  BP: 132/86 (!) 146/90  Pulse: 82 87  Resp: 18 18  Temp: 98.5 F (36.9 C) 98.6 F (37 C)  SpO2: 100% 100%    Intake/Output Summary (Last 24 hours) at 05/14/2021 1557 Last data filed at 05/14/2021 1543 Gross per 24 hour  Intake --  Output 1050 ml  Net -1050 ml   Filed Weights   05/11/21 0800 05/12/21 2230  Weight: 70.4 kg 69.6 kg   Body mass index is 23.33 kg/m.   Physical Exam: GENERAL: Patient is alert awake and oriented. Not in obvious distress. HENT: No scleral pallor or icterus. Pupils equally reactive to light. Oral mucosa is moist NECK: is supple, no gross swelling noted. CHEST: Clear to auscultation. No crackles or wheezes.  Diminished breath sounds bilaterally. CVS: S1 and S2 heard, no murmur. Regular rate and rhythm.  ABDOMEN: Soft, non-tender, bowel sounds are present. EXTREMITIES: Right-sided dense hemiplegia. CNS: Speech is intact.  Right-sided dense hemiplegia. SKIN: warm and dry without rashes.  Data Review: I have personally reviewed the following laboratory data and studies,  CBC: Recent Labs  Lab 05/11/21 0851 05/11/21 0901 05/12/21 0341 05/13/21 0338 05/14/21 0153  WBC 7.6  --  12.4* 8.1 7.8  NEUTROABS 1.7  --   --   --   --   HGB 14.8 15.0 15.6 15.6 15.5  HCT 45.5 44.0 46.2 47.4 47.0  MCV 91.4  --  89.4 90.1 89.7  PLT 257  --  232 233 237   Basic Metabolic Panel: Recent Labs  Lab 05/11/21 0851 05/11/21 0901 05/12/21 0341 05/13/21 0338 05/14/21 0153  NA 139 142 138 138 138  K 3.5 3.5 3.3* 3.7 3.8  CL 110 107 100 102 105  CO2 22  --  25 25 24   GLUCOSE 156* 154* 139* 116* 108*  BUN 8 11 7* 9 10  CREATININE 1.18 1.10 0.98 1.12 1.03  CALCIUM 8.9  --  9.7 9.5 9.5  MG  --   --   --   --  2.1   PHOS  --   --   --   --  2.9   Liver Function Tests: Recent Labs  Lab 05/11/21 0851 05/12/21 0341  AST 23 24  ALT 17 19  ALKPHOS 94 108  BILITOT 0.7 0.8  PROT 6.7 7.5  ALBUMIN 3.9 4.3   No results for input(s): LIPASE, AMYLASE in the last 168 hours. No results for input(s): AMMONIA in the last 168 hours. Cardiac Enzymes: No results for input(s): CKTOTAL, CKMB, CKMBINDEX, TROPONINI in the last 168 hours. BNP (last 3 results) No results for input(s): BNP in the last 8760 hours.  ProBNP (last 3 results) No results for input(s): PROBNP in the last 8760 hours.  CBG: Recent Labs  Lab 05/11/21 0850  GLUCAP  162*   Recent Results (from the past 240 hour(s))  Resp Panel by RT-PCR (Flu A&B, Covid) Nasopharyngeal Swab     Status: None   Collection Time: 05/11/21  9:56 AM   Specimen: Nasopharyngeal Swab; Nasopharyngeal(NP) swabs in vial transport medium  Result Value Ref Range Status   SARS Coronavirus 2 by RT PCR NEGATIVE NEGATIVE Final    Comment: (NOTE) SARS-CoV-2 target nucleic acids are NOT DETECTED.  The SARS-CoV-2 RNA is generally detectable in upper respiratory specimens during the acute phase of infection. The lowest concentration of SARS-CoV-2 viral copies this assay can detect is 138 copies/mL. A negative result does not preclude SARS-Cov-2 infection and should not be used as the sole basis for treatment or other patient management decisions. A negative result may occur with  improper specimen collection/handling, submission of specimen other than nasopharyngeal swab, presence of viral mutation(s) within the areas targeted by this assay, and inadequate number of viral copies(<138 copies/mL). A negative result must be combined with clinical observations, patient history, and epidemiological information. The expected result is Negative.  Fact Sheet for Patients:  BloggerCourse.com  Fact Sheet for Healthcare Providers:   SeriousBroker.it  This test is no t yet approved or cleared by the [place] FDA and  has been authorized for detection and/or diagnosis of SARS-CoV-2 by FDA under an Emergency Use Authorization (EUA). This EUA will remain  in effect (meaning this test can be used) for the duration of the COVID-19 declaration under Section 564(b)(1) of the Act, 21 U.S.C.section 360bbb-3(b)(1), unless the authorization is terminated  or revoked sooner.       Influenza A by PCR NEGATIVE NEGATIVE Final   Influenza B by PCR NEGATIVE NEGATIVE Final    Comment: (NOTE) The Xpert Xpress SARS-CoV-2/FLU/RSV plus assay is intended as an aid in the diagnosis of influenza from Nasopharyngeal swab specimens and should not be used as a sole basis for treatment. Nasal washings and aspirates are unacceptable for Xpert Xpress SARS-CoV-2/FLU/RSV testing.  Fact Sheet for Patients: BloggerCourse.com  Fact Sheet for Healthcare Providers: SeriousBroker.it  This test is not yet approved or cleared by the [place] FDA and has been authorized for detection and/or diagnosis of SARS-CoV-2 by FDA under an Emergency Use Authorization (EUA). This EUA will remain in effect (meaning this test can be used) for the duration of the COVID-19 declaration under Section 564(b)(1) of the Act, 21 U.S.C. section 360bbb-3(b)(1), unless the authorization is terminated or revoked.  Performed at [facility], [address] [place], [place] [address]   MRSA PCR Screening     Status: None   Collection Time: 05/11/21 11:05 AM   Specimen: Nasopharyngeal  Result Value Ref Range Status   MRSA by PCR NEGATIVE NEGATIVE Final    Comment:        The GeneXpert MRSA Assay (FDA approved for NASAL specimens only), is one component of a comprehensive MRSA colonization surveillance program. It is not intended to diagnose MRSA infection nor to guide  or monitor treatment for MRSA infections. Performed at [facility], [address], [place], [place] [address]      Studies: No results found.  Joycelyn Das, MD  [facility] 05/14/2021  If 7PM-7AM, please contact night-coverage

## 2021-05-14 NOTE — Progress Notes (Signed)
Inpatient Rehab Admissions Coordinator:   Awaiting determination from insurance regarding prior auth request for CIR.  Will continue to follow.   Estill Dooms, PT, DPT Admissions Coordinator [phone] 05/14/21  3:02 PM

## 2021-05-14 NOTE — Progress Notes (Signed)
Patient is refusing SCD's, RN educated patient on the purpose and importance of SCD compliance but patient continues to refuse.

## 2021-05-14 NOTE — Progress Notes (Signed)
STROKE TEAM PROGRESS NOTE   SUBJECTIVE (INTERVAL HISTORY) His wife is at the bedside.  Patient sitting in chair, still has left hemiplegia.  Denies any HA.  Primary team is working on constipation.  PT/OT recommended CIR.   OBJECTIVE Temp:  [98 F (36.7 C)-100.1 F (37.8 C)] 98.6 F (37 C) (06/03 1536) Pulse Rate:  [79-87] 87 (06/03 1536) Cardiac Rhythm: Normal sinus rhythm (06/03 0700) Resp:  [16-20] 18 (06/03 1536) BP: (132-152)/(83-90) 146/90 (06/03 1536) SpO2:  [100 %] 100 % (06/03 1536)  Recent Labs  Lab 05/11/21 0850  GLUCAP 162*   Recent Labs  Lab 05/11/21 0851 05/11/21 0901 05/12/21 0341 05/13/21 0338 05/14/21 0153  NA 139 142 138 138 138  K 3.5 3.5 3.3* 3.7 3.8  CL 110 107 100 102 105  CO2 22  --  25 25 24   GLUCOSE 156* 154* 139* 116* 108*  BUN 8 11 7* 9 10  CREATININE 1.18 1.10 0.98 1.12 1.03  CALCIUM 8.9  --  9.7 9.5 9.5  MG  --   --   --   --  2.1  PHOS  --   --   --   --  2.9   Recent Labs  Lab 05/11/21 0851 05/12/21 0341  AST 23 24  ALT 17 19  ALKPHOS 94 108  BILITOT 0.7 0.8  PROT 6.7 7.5  ALBUMIN 3.9 4.3   Recent Labs  Lab 05/11/21 0851 05/11/21 0901 05/12/21 0341 05/13/21 0338 05/14/21 0153  WBC 7.6  --  12.4* 8.1 7.8  NEUTROABS 1.7  --   --   --   --   HGB 14.8 15.0 15.6 15.6 15.5  HCT 45.5 44.0 46.2 47.4 47.0  MCV 91.4  --  89.4 90.1 89.7  PLT 257  --  232 233 237   No results for input(s): CKTOTAL, CKMB, CKMBINDEX, TROPONINI in the last 168 hours. No results for input(s): LABPROT, INR in the last 72 hours. No results for input(s): COLORURINE, LABSPEC, PHURINE, GLUCOSEU, HGBUR, BILIRUBINUR, KETONESUR, PROTEINUR, UROBILINOGEN, NITRITE, LEUKOCYTESUR in the last 72 hours.  Invalid input(s): APPERANCEUR     Component Value Date/Time   CHOL 232 (H) 05/11/2021 1118   CHOL 204 (H) 09/11/2020 1449   TRIG 73 05/11/2021 1118   HDL 77 05/11/2021 1118   HDL 63 09/11/2020 1449   CHOLHDL 3.0 05/11/2021 1118   VLDL 15 05/11/2021 1118    LDLCALC 140 (H) 05/11/2021 1118   LDLCALC 132 (H) 09/11/2020 1449   LDLCALC 107 (H) 04/29/2020 0826   Lab Results  Component Value Date   HGBA1C 5.8 (H) 05/11/2021      Component Value Date/Time   LABOPIA NONE DETECTED 05/11/2021 1109   COCAINSCRNUR NONE DETECTED 05/11/2021 1109   LABBENZ NONE DETECTED 05/11/2021 1109   AMPHETMU NONE DETECTED 05/11/2021 1109   THCU NONE DETECTED 05/11/2021 1109   LABBARB NONE DETECTED 05/11/2021 1109    Recent Labs  Lab 05/11/21 1118  ETH <10    I have personally reviewed the radiological images below and agree with the radiology interpretations.  CT HEAD WO CONTRAST  Result Date: 05/11/2021 CLINICAL DATA:  Stroke.  Intracranial hemorrhage follow-up. EXAM: CT HEAD WITHOUT CONTRAST TECHNIQUE: Contiguous axial images were obtained from the base of the skull through the vertex without intravenous contrast. COMPARISON:  Same day head CT. FINDINGS: Brain: Mild interval increase in the size of the right frontal intraparenchymal hemorrhage, measuring 4.5 x 2.6 x 3.5 cm (previously 4.2 x 2.3 x  3.1 cm). Similar adjacent small volume subarachnoid hemorrhage. A small amount of subdural hemorrhage in this region is also not excluded. Surrounding edema is similar. Similar local mass effect without midline shift. Basal cisterns are patent. No evidence of acute large vascular territory infarct elsewhere. Similar mild white matter hypodensities, likely chronic microvascular ischemic disease. No hydrocephalus. Vascular: No hyperdense vessel.  Calcific atherosclerosis. Skull: No acute fracture. Sinuses/Orbits: Small left frontal sinus osteoma. Mild right and moderate left maxillary sinus mucosal thickening. Unremarkable orbits. Other: No mastoid effusions. IMPRESSION: Mild interval increase in the size of the right frontal intraparenchymal hemorrhage, measuring 4.5 x 2.6 x 3.5 cm (previously 4.2 x 2.3 x 3.1 cm). Similar adjacent small volume hemorrhage. Electronically  Signed   By: Feliberto Harts MD   On: 05/11/2021 17:05   MR BRAIN WO CONTRAST  Result Date: 05/12/2021 CLINICAL DATA:  65 year old male code stroke presentation with intra-axial right hemisphere hemorrhage. Hypertensive (194/104) on presentation. EXAM: MRI HEAD WITHOUT CONTRAST TECHNIQUE: Multiplanar, multiecho pulse sequences of the brain and surrounding structures were obtained without intravenous contrast. COMPARISON:  CT head CTA head and neck 05/11/2021 FINDINGS: Brain: Coronal T2 weighted imaging could not be obtained. And some of the exam is intermittently degraded by motion artifact despite repeated imaging attempts. T1 isointense intra-axial hemorrhage in the posterior right frontal lobe demonstrates a layering hematocrit level (series 13, image 19) and blood encompasses 43 by 38 x 33 mm (AP by transverse by CC) for an estimated volume of 26 mL. Regional edema. Mild regional mass effect. Trace subarachnoid hemorrhage also suspected on axial FLAIR imaging. Additionally, on SWI there is also chronic hemosiderin in the right parietal lobe on series 19 image 34. But no other definite chronic blood products. Susceptibility related abnormal diffusion in the posterior right frontal lobe with no convincing larger area of restricted diffusion. No restricted diffusion elsewhere. No IVH or ventriculomegaly. Normal basilar cisterns. Negative pituitary and cervicomedullary junction. Wallace Cullens and white matter signal outside of the affected right hemisphere is largely normal for age with mild nonspecific white matter changes. Vascular: Major intracranial vascular flow voids are preserved. Skull and upper cervical spine: Visualized bone marrow signal is within normal limits. Grossly negative cervical spine. Sinuses/Orbits: Negative orbits. Mild to moderate maxillary sinus mucosal thickening. Other: Mastoids are clear. Grossly normal visible internal auditory structures. IMPRESSION: 1. Posterior right frontal lobe  intra-axial hemorrhage with layering hematocrit level has not significantly changed (estimated blood volume of 26 mL). Surrounding edema and mild regional mass effect. Trace subarachnoid hemorrhage. 2. No larger underlying infarct is evident. But chronic hemosiderin in the right parietal lobe indicates a previous intra-axial hemorrhage in the region. 3. No other acute intracranial abnormality. Electronically Signed   By: Odessa Fleming M.D.   On: 05/12/2021 07:26   MR MRV HEAD W WO CONTRAST  Result Date: 05/12/2021 CLINICAL DATA:  Intracranial hemorrhage. EXAM: MR VENOGRAM HEAD WITHOUT AND WITH CONTRAST TECHNIQUE: Angiographic images of the intracranial venous structures were acquired using MRV technique without and with intravenous contrast. CONTRAST:  7mL GADAVIST GADOBUTROL 1 MMOL/ML IV SOLN COMPARISON:  Brain MRI performed earlier today 05/12/2021. Noncontrast head CT examinations 05/11/2021. CT angiogram head/neck 05/11/2021. FINDINGS: The superior sagittal sinus, internal cerebral veins, vein of Galen, straight sinus, transverse sinuses, sigmoid sinuses and visualized jugular veins are patent. No appreciable intracranial venous thrombosis. Hypoplastic right transverse and sigmoid dural venous sinuses. An acute parenchymal hemorrhage within the posterior right frontal lobe is grossly unchanged in size as compared to the brain  MRI performed earlier today. There is mild curvilinear vascular enhancement overlying the site of hemorrhage, which may be reactive. Elsewhere, no abnormal intracranial enhancement is identified. IMPRESSION: No evidence of intracranial venous thrombosis. Non dominant right transverse and sigmoid dural venous sinuses. Electronically Signed   By: Jackey Loge DO   On: 05/12/2021 11:27   ECHOCARDIOGRAM COMPLETE  Result Date: 05/11/2021    ECHOCARDIOGRAM REPORT   Patient Name:   [facility] Date of Exam: 05/11/2021 Medical Rec #:  [id number]        Height:       68.0 in Accession #:     [id number]       Weight:       155.2 lb Date of Birth:  [birth year]         BSA:          1.835 m Patient Age:    65 years         BP:           115/77 mmHg Patient Gender: M                HR:           84 bpm. Exam Location:  Inpatient Procedure: 2D Echo, Cardiac Doppler and Color Doppler Indications:    Stroke I63.9  History:        Patient has no prior history of Echocardiogram examinations.  Sonographer:    Roosvelt Maser RDCS Referring Phys: [id number] Reyne Dumas [facility] IMPRESSIONS  1. Left ventricular ejection fraction, by estimation, is 65 to 70%. The left ventricle has normal function. The left ventricle has no regional wall motion abnormalities. Left ventricular diastolic parameters were normal.  2. Right ventricular systolic function is normal. The right ventricular size is normal. Tricuspid regurgitation signal is inadequate for assessing PA pressure.  3. The mitral valve is normal in structure. No evidence of mitral valve regurgitation.  4. The aortic valve was not well visualized. Aortic valve regurgitation is not visualized. No aortic stenosis is present.  5. The inferior vena cava is normal in size with greater than 50% respiratory variability, suggesting right atrial pressure of 3 mmHg. FINDINGS  Left Ventricle: Left ventricular ejection fraction, by estimation, is 65 to 70%. The left ventricle has normal function. The left ventricle has no regional wall motion abnormalities. The left ventricular internal cavity size was normal in size. There is  no left ventricular hypertrophy. Left ventricular diastolic parameters were normal. Right Ventricle: The right ventricular size is normal. No increase in right ventricular wall thickness. Right ventricular systolic function is normal. Tricuspid regurgitation signal is inadequate for assessing PA pressure. Left Atrium: Left atrial size was normal in size. Right Atrium: Right atrial size was normal in size. Pericardium: There is no evidence of pericardial effusion.  Mitral Valve: The mitral valve is normal in structure. No evidence of mitral valve regurgitation. Tricuspid Valve: The tricuspid valve is normal in structure. Tricuspid valve regurgitation is not demonstrated. Aortic Valve: The aortic valve was not well visualized. Aortic valve regurgitation is not visualized. No aortic stenosis is present. Aortic valve mean gradient measures 3.0 mmHg. Aortic valve peak gradient measures 6.7 mmHg. Aortic valve area, by VTI measures 2.77 cm. Pulmonic Valve: The pulmonic valve was not well visualized. Pulmonic valve regurgitation is not visualized. Aorta: The aortic root and ascending aorta are structurally normal, with no evidence of dilitation. Venous: The inferior vena cava is normal in size with greater than 50% respiratory variability, suggesting right  atrial pressure of 3 mmHg. IAS/Shunts: The interatrial septum was not well visualized.  LEFT VENTRICLE PLAX 2D LVIDd:         4.90 cm  Diastology LVIDs:         2.80 cm  LV e' medial:    8.38 cm/s LV PW:         0.80 cm  LV E/e' medial:  9.0 LV IVS:        0.90 cm  LV e' lateral:   9.14 cm/s LVOT diam:     1.90 cm  LV E/e' lateral: 8.2 LV SV:         69 LV SV Index:   37 LVOT Area:     2.84 cm  RIGHT VENTRICLE RV Basal diam:  2.90 cm LEFT ATRIUM           Index       RIGHT ATRIUM           Index LA diam:      3.10 cm 1.69 cm/m  RA Area:     11.20 cm LA Vol (A2C): 26.8 ml 14.61 ml/m RA Volume:   21.90 ml  11.94 ml/m LA Vol (A4C): 39.2 ml 21.36 ml/m  AORTIC VALVE AV Area (Vmax):    2.64 cm AV Area (Vmean):   2.59 cm AV Area (VTI):     2.77 cm AV Vmax:           129.00 cm/s AV Vmean:          86.700 cm/s AV VTI:            0.248 m AV Peak Grad:      6.7 mmHg AV Mean Grad:      3.0 mmHg LVOT Vmax:         120.00 cm/s LVOT Vmean:        79.200 cm/s LVOT VTI:          0.242 m LVOT/AV VTI ratio: 0.98  AORTA Ao Root diam: 2.50 cm Ao Asc diam:  3.30 cm MITRAL VALVE MV Area (PHT): 3.60 cm     SHUNTS MV Decel Time: 211 msec      Systemic VTI:  0.24 m MV E velocity: 75.30 cm/s   Systemic Diam: 1.90 cm MV A velocity: 107.00 cm/s MV E/A ratio:  0.70 Epifanio Lescheshristopher Schumann MD Electronically signed by Epifanio Lescheshristopher Schumann MD Signature Date/Time: 05/11/2021/5:28:15 PM    Final    CT HEAD CODE STROKE WO CONTRAST  Result Date: 05/11/2021 CLINICAL DATA:  Code stroke. Left-sided weakness, nausea, and vomiting. EXAM: CT HEAD WITHOUT CONTRAST TECHNIQUE: Contiguous axial images were obtained from the base of the skull through the vertex without intravenous contrast. COMPARISON:  None. FINDINGS: Brain: An acute parenchymal hemorrhage in the high posterior right frontal lobe measures 4.2 x 2.3 x 3.1 cm (approximate volume of 15 mL). There is mild surrounding edema without midline shift, and there is a small amount of adjacent subarachnoid hemorrhage. A very small amount of coexistent subdural hemorrhage is also not excluded in this region. No acute infarct is identified elsewhere. The ventricles are normal in size. Hypodensities in the cerebral white matter bilaterally are nonspecific but compatible with mild chronic small vessel ischemic disease. Vascular: No hyperdense vessel. Skull: No fracture or suspicious osseous lesion. Sinuses/Orbits: Small osteoma in the left frontal sinus. Mild right and moderate left maxillary sinus mucosal thickening. Clear mastoid air cells. Unremarkable orbits. Other: None. ASPECTS [facility]([place] Stroke Program Early CT Score) Not scored in  the presence of acute hemorrhage. IMPRESSION: Acute parenchymal hemorrhage in the posterior right frontal lobe with small amount of adjacent subarachnoid hemorrhage. These results were communicated to Dr. Iver Nestle at 9:08 am on 05/11/2021 by text page via the [facility] messaging system. Electronically Signed   By: Sebastian Ache M.D.   On: 05/11/2021 09:12   CT ANGIO HEAD NECK W WO CM (CODE STROKE)  Result Date: 05/11/2021 CLINICAL DATA:  Stroke follow-up.  Left-sided weakness. EXAM: CT ANGIOGRAPHY  HEAD AND NECK TECHNIQUE: Multidetector CT imaging of the head and neck was performed using the standard protocol during bolus administration of intravenous contrast. Multiplanar CT image reconstructions and MIPs were obtained to evaluate the vascular anatomy. Carotid stenosis measurements (when applicable) are obtained utilizing NASCET criteria, using the distal internal carotid diameter as the denominator. CONTRAST:  81mL OMNIPAQUE IOHEXOL 350 MG/ML SOLN COMPARISON:  Same day CT head. FINDINGS: CTA NECK FINDINGS Aortic arch: Great vessel origins are patent. Right carotid system: No evidence of dissection, stenosis (50% or greater) or occlusion. Mild apparent irregularity of the ICA at the skull base in a region of streak artifact. Left carotid system: No evidence of dissection, stenosis (50% or greater) or occlusion. Mild apparent irregularity of the ICA at the skull base in a region of streak artifact. Vertebral arteries: Codominant. No evidence of dissection, stenosis (50% or greater) or occlusion. Skeleton: Moderate degenerative disc disease at at C5-C6 and C6-C7 with disc height loss, endplate sclerosis and posterior disc osteophyte complexes. Other neck: No acute abnormality Upper chest: Visualized lung apices are clear. Review of the MIP images confirms the above findings CTA HEAD FINDINGS Anterior circulation: No large vessel occlusion or proximal hemodynamically significant stenosis. Evaluation of the distal vasculature is limited due to venous contamination. No evidence of and arteriovenous malformation or aneurysm in the region of intraparenchymal hemorrhage, although acute blood products limit evaluation. Posterior circulation: No large vessel occlusion or proximal hemodynamically significant stenosis. Venous sinuses: The superior sagittal sinus is narrowed in the region of hemorrhage, most likely from mass effect. Small right transverse and sigmoid sinuses. Review of the MIP images confirms the above  findings IMPRESSION: CTA Head: 1. No large vessel occlusion or hemodynamically significant proximal arterial stenosis. 2. The superior sagittal sinus is narrowed in the region of hemorrhage with poorly visualized draining cortical veins in this region. While indeterminate, this may be secondary to mass effect given no definite/clear intraluminal thrombus. Given these findings and the location of hemorrhage; however, recommend low threshold for follow-up CTV or MRI with contrast to ensure stability and exclude worsening thrombosis. 3. No evidence of and arteriovenous malformation or aneurysm in the region of intraparenchymal hemorrhage, although acute blood products limit evaluation. Follow-up imaging after resolution of hemorrhage could provide further assessment if clinically indicated. CTA Neck: 1. No significant (greater than 50%) stenosis. 2. Mild apparent irregularity of bilateral ICAs at the skull base is favored artifactual given streak artifact from dental amalgam in this region. Fibromuscular dysplasia is a differential consideration. Findings and recommendations discussed with Dr. Iver Nestle via telephone at 9:59 a.m. Electronically Signed   By: Feliberto Harts MD   On: 05/11/2021 10:09    PHYSICAL EXAM  Temp:  [98 F (36.7 C)-100.1 F (37.8 C)] 98.6 F (37 C) (06/03 1536) Pulse Rate:  [79-87] 87 (06/03 1536) Resp:  [16-20] 18 (06/03 1536) BP: (132-152)/(83-90) 146/90 (06/03 1536) SpO2:  [100 %] 100 % (06/03 1536)  General - Well nourished, well developed, in no apparent distress.  Ophthalmologic -  fundi not visualized due to noncooperation.  Cardiovascular - Regular rhythm and rate.  Neuro - awake, alert, eyes open, orientated to age, place, time and people. No aphasia, paucity of speech, following all simple commands. Able to name and repeat. No significant gaze palsy, visual field full, PERRL. Left facial droop. Tongue midline. Right UE 5/5 and LE 4/5, LUE and LLE plegic Sensation  symmetrical bilaterally subjectively. right FTN intact, gait not tested.     ASSESSMENT/PLAN Mr. Rami Budhu is a 65 y.o. male with history of demential with behavioral disturbance and OSA on CPAP admitted for right gaze and left hemoplegia, left facial droop. No tPA given due to ICH.    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 No CSVT on venous phase  CT repeat mildly increased left frontal ICH  MRI brain no significant change of ICH since last CT  MRV Non dominant right transverse and sigmoid dural venous sinuses.  May need to repeat CTA head and MRI with and without contrast once hematoma resolves as outpt to rule out underlying source which is masked by current ICH.   2D Echo  EF 65-70%  LDL 140  HgbA1c 5.8  UDS neg  Heparin subq for VTE prophylaxis  No antithrombotic prior to admission, now on No antithrombotic given ICH  Ongoing aggressive stroke risk factor management  Therapy recommendations:  CIR  Disposition:  Pending   ?? CAA  MRI did not show typical CAA pattern but did show some chronic hemosiderin in the right parietal lobe indicates a previous intra-axial hemorrhage in the region.  Lobar ICH without significant hx of HTN concerning for CAA  Pt does have hx of cognitive impairment with behavioral disturbance per wife, especially since 11/2019 after a fall  Against antithrombotic use in the future given concerns of CAA at this time.  Dementia   Has appointment with [facility] neurology for LP and evaluation of dementia, but pt not able to go due to onset of ICH  Delirium precaution  Continue outpt follow up with [facility] neurolog[facility]  HTN . Overnight BP high needed labetalol IV PRN . Off cleviprex now . Put on amlodipine 10 . BP goal < 160  Long term BP goal normotensive  Hyperlipidemia  Home meds:  none   LDL 140, goal <  70  Will consider low dose statin (lipitor 20) on discharge   Dysphagia   On dys 1 diet with thin liquid  Speech to follow  Other Stroke Risk Factors  Advanced age  Obstructive sleep apnea, on CPAP at home  Other Active Problems  Hypokalemia - K 3.3 - >3.7->3.8  Leukocytosis WBC 7.6->12.4 ->8.1->7.8  Hospital day # 3  Neurology will sign off. Please call with questions. Pt will follow up with [facility] neurology in about 4 weeks. Thanks for the consult.   Marvel Plan, MD PhD Stroke Neurology 05/14/2021 6:16 PM    To contact Stroke Continuity provider, please refer to [email or web address]. After hours, contact General Neurology

## 2021-05-14 NOTE — Progress Notes (Addendum)
Occupational Therapy Treatment Patient Details Name: Donald [name] MRN: [id number] DOB: 1956-01-29 Today's Date: 05/14/2021    History of present illness 65 y.o. M who presents with right gaze and left hemiplegia. 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 Significant PMH: memory deficits, OSA.   OT comments  Pt progressing towards established OT goals and demonstrates high motivation to participate in therapy. Facilitating PROM of L scapular, shoulder, elbow, and hand to optimize functional use and reduce tone. Pt performing oral care at recliner with Mod A for sitting balance and incorporation of LUE and Max cues for sequencing and visual scanning. Pt very engaged throughout and noting increased visual scanning as session progressed. Wife very supportive. Continue to highly recommend dc to CIR for intensive OT and will continue to follow acutely as admitted.    Follow Up Recommendations  CIR    Equipment Recommendations  3 in 1 bedside commode    Recommendations for Other Services Rehab consult    Precautions / Restrictions Precautions Precautions: Fall Precaution Comments: L hemiparesis, L neglect, BP < 140       Mobility Bed Mobility               General bed mobility comments: Pt sitting in recliner upon arrival    Transfers                      Balance Overall balance assessment: Needs assistance Sitting-balance support: Feet supported;Single extremity supported Sitting balance-Leahy Scale: Poor Sitting balance - Comments: L lateral lean with upright posture. Able to correct with Max verbal cues and Min tactile cues. Postural control: Left lateral lean;Posterior lean                                 ADL either performed or assessed with clinical judgement   ADL Overall ADL's : Needs assistance/impaired     Grooming: Moderate assistance;Sitting;Cueing for sequencing Grooming Details (indicate cue type and reason): Pt  requiring Min-Mod A for sitting balance in chair while performing oral care. Pt requiring mod A to incorporate LUE into task. Max cues throughout for sequencing and visual scanning to L                               General ADL Comments: Focused session on sitting balance, visual scanning, cognition, and incorporation of LUE into task.     Vision   Vision Assessment?: Vision impaired- to be further tested in functional context;Yes Eye Alignment: Impaired (comment) Ocular Range of Motion: Impaired-to be further tested in functional context;Restricted on the right Alignment/Gaze Preference: Gaze right;Head turned (head turned L; gaze right) Tracking/Visual Pursuits: Impaired - to be further tested in functional context Visual Fields: Left visual field deficit Additional Comments: Pt with right gaze and head turn. Inattention/neglect to L. Poor visual tracking to L. possible L visual field cut   Perception     Praxis      Cognition Arousal/Alertness: Awake/alert Behavior During Therapy: WFL for tasks assessed/performed Overall Cognitive Status: History of cognitive impairments - at baseline Area of Impairment: Memory;Following commands;Safety/judgement;Awareness;Problem solving                 Orientation Level: Disoriented to;Time;Situation Current Attention Level: Sustained Memory: Decreased short-term memory Following Commands: Follows one step commands with increased time;Follows one step commands inconsistently Safety/Judgement: Decreased awareness  of safety;Decreased awareness of deficits Awareness: Intellectual Problem Solving: Requires verbal cues;Requires tactile cues;Slow processing;Decreased initiation;Difficulty sequencing General Comments: Pt very motivated to participate in therapy. Poor awareness of deficits and requiring cues throughout for sitting balance and L neglect. Pt requiring increased cues thorughout for sequencing of ADLs.         Exercises Exercises: General Upper Extremity;Other exercises General Exercises - Upper Extremity Shoulder Flexion: PROM;Left;10 reps;Seated (assistance for scapular rotation) Shoulder Extension: PROM;Left;10 reps;Seated (assistance for scapular rotation) Elbow Flexion: PROM;Left;10 reps;Seated Elbow Extension: PROM;Left;10 reps;Seated Wrist Flexion: PROM;Left;10 reps;Seated Wrist Extension: PROM;Left;10 reps;Seated Digit Composite Flexion: PROM;Left;10 reps;Seated Composite Extension: PROM;Left;10 reps;Seated Chair Push Up: PROM;Left;10 reps;Seated Other Exercises Other Exercises: Scapular elevation/depression, retraction/protraction, and upward rotation/downward rotation. x10. seated. Other Exercises: bilateral scapular retraction   Shoulder Instructions       General Comments Wife present throughout    Pertinent Vitals/ Pain       Pain Assessment: No/denies pain  Home Living                                          Prior Functioning/Environment              Frequency  Min 2X/week        Progress Toward Goals  OT Goals(current goals can now be found in the care plan section)  Progress towards OT goals: Progressing toward goals  Acute Rehab OT Goals Patient Stated Goal: Get stronger and go home. OT Goal Formulation: With patient/family Time For Goal Achievement: 05/26/21 Potential to Achieve Goals: Good ADL Goals Pt Will Perform Grooming: with supervision;sitting Pt Will Transfer to Toilet: with mod assist;squat pivot transfer;with +2 assist;bedside commode Pt/caregiver will Perform Home Exercise Program: Increased strength;Left upper extremity;With minimal assist;With written HEP provided Additional ADL Goal #1: Pt will recall 3 household items and utilize properly with minimal cues. Additional ADL Goal #2: Pt will perform bed mobility with modA for proper sequencing. Additional ADL Goal #3: Pt will perform ADL in sitting with minA for  dynamic sitting balance to increase as precursor for ADL.  Plan Discharge plan remains appropriate    Co-evaluation                 AM-PAC OT "6 Clicks" Daily Activity     Outcome Measure   Help from another person eating meals?: A Little Help from another person taking care of personal grooming?: A Lot Help from another person toileting, which includes using toliet, bedpan, or urinal?: A Lot Help from another person bathing (including washing, rinsing, drying)?: A Lot Help from another person to put on and taking off regular upper body clothing?: A Lot Help from another person to put on and taking off regular lower body clothing?: A Lot 6 Click Score: 13    End of Session    OT Visit Diagnosis: Unsteadiness on feet (R26.81);Muscle weakness (generalized) (M62.81);Pain;Hemiplegia and hemiparesis;Other symptoms and signs involving cognitive function;Cognitive communication deficit (R41.841) Symptoms and signs involving cognitive functions: Nontraumatic SAH Hemiplegia - Right/Left: Left Hemiplegia - dominant/non-dominant: Non-Dominant Hemiplegia - caused by: Nontraumatic SAH Pain - Right/Left: Right Pain - part of body:  (neck)   Activity Tolerance Patient tolerated treatment well   Patient Left with call bell/phone within reach;in chair;with chair alarm set;with family/visitor present   Nurse Communication Mobility status        Time: 1626-1700 OT Time Calculation (min): 34  min  Charges: OT General Charges $OT Visit: 1 Visit OT Treatments $Self Care/Home Management : 8-22 mins $Therapeutic Exercise: 8-22 mins  Elijio Staples MSOT, OTR/L Acute Rehab Pager: [phone] Office: [phone]   Theodoro Grist Kathleena  05/14/2021, 5:26 PM

## 2021-05-14 NOTE — Progress Notes (Signed)
  Speech Language Pathology Treatment: Dysphagia  Patient Details Name: Donald [name] MRN: [id number] DOB: October 29, 1956 Today's Date: 05/14/2021 Time: 1610-1630 SLP Time Calculation (min) (ACUTE ONLY): 20 min  Assessment / Plan / Recommendation Clinical Impression  Pt sitting in recliner with wife present.  Very interactive and friendly.  Initially with strong gaze preference to right, however with cues, easily tracked to the left and reminded himself to make eye contact and look to the left when clinician was on that side.  Demonstrated improved attention to left pocketing with trials of graham crackers, requiring intermittent verbal cues to check/perform lingual sweep.  Consumed water and crackers interchangeably with no concerns for aspiration and occasional self-cueing.  Recommend advancing diet to dysphagia 2, thin liquids; SLP will continue to follow for swallowing/cognition.   HPI HPI: Donald [name] is a 65 y.o. male with a medical history significant for dementia with behavioral disturbance, OSA, arthritis who presented to the ED via EMS for evaluation of acute onset of left hemiparesis, right gaze, and left mouth droop. CT acute parenchymal hemorrhage in the posterior right frontal lobe with small amount of adjacent subarachnoid hemorrhage. Per chart documentation at baseline pt cares for himself with some short term memory deficits and had a scheduled lumbar puncture today for outpatient evaluation of possible Alzheimer's Dementia at [facility]. Failed [facility] screen due to facial droop and significant leakage.      SLP Plan  Continue with current plan of care       Recommendations  Diet recommendations: Dysphagia 2 (fine chop);Thin liquid Liquids provided via: Cup;Straw Medication Administration: Crushed with puree Supervision: Staff to assist with self feeding;Full supervision/cueing for compensatory strategies Compensations: Minimize environmental distractions;Slow rate;Small  sips/bites;Lingual sweep for clearance of pocketing Postural Changes and/or Swallow Maneuvers: Seated upright 90 degrees                Oral Care Recommendations: Oral care BID Follow up Recommendations: Inpatient Rehab SLP Visit Diagnosis: Dysphagia, oral phase (R13.11) Plan: Continue with current plan of care       GO              Shirlyn Savin L. Samson Frederic, MA CCC/SLP Acute Rehabilitation Services Office number [phone] Pager [phone]   Blenda Mounts Laurice 05/14/2021, 4:44 PM

## 2021-05-14 NOTE — Progress Notes (Signed)
Physical Therapy Treatment Patient Details Name: Donald [name] MRN: [id number] DOB: 1956-08-15 Today's Date: 05/14/2021    History of Present Illness Pt is a 65 y.o. M who presents with right gaze and left hemiplegia. 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 Significant PMH: memory deficits, OSA.    PT Comments    Pt received in bed, eager to participate in therapy. Pt with primary c/o pain R side of neck due to muscle tightness. Pt required mod assist bed mobility, max assist sit to stand, and max assist SPT. He demonstrates poor sitting balance. Pt tends to push toward L when using his RUE for support. Increased time and continuous multimodal cues required to complete functional mobility skills. Encouragement needed to look L and attend to L side. Wfie reports yesterday pt stated someone else's hand was in bed with him (in reference to his LUE). Pt in recliner with feet elevated at end of session. Hot pack applied R lateral neck. Wife present in room.     Follow Up Recommendations  CIR     Equipment Recommendations  3in1 (PT);Wheelchair (measurements PT);Wheelchair cushion (measurements PT);Hospital bed    Recommendations for Other Services Rehab consult     Precautions / Restrictions Precautions Precautions: Fall Precaution Comments: L hemiparesis, L neglect, extensor tone, BP < 140 Restrictions Weight Bearing Restrictions: No    Mobility  Bed Mobility Overal bed mobility: Needs Assistance Bed Mobility: Supine to Sit     Supine to sit: Mod assist;HOB elevated     General bed mobility comments: cues for sequencing, assist with LLE and to elevate trunk, increased time    Transfers Overall transfer level: Needs assistance Equipment used: 1 person hand held assist Transfers: Sit to/from Stand;Stand Pivot Transfers Sit to Stand: From elevated surface Stand pivot transfers: Max assist       General transfer comment: cues for sequencing, assist to  power up and stabilize balance, increased time to attain full,upright stance, pivot transfer toward R bed to recliner  Ambulation/Gait             General Gait Details: unable   Stairs             Wheelchair Mobility    Modified Rankin (Stroke Patients Only) Modified Rankin (Stroke Patients Only) Pre-Morbid Rankin Score: No symptoms Modified Rankin: Severe disability     Balance Overall balance assessment: Needs assistance Sitting-balance support: Feet supported;Single extremity supported Sitting balance-Leahy Scale: Poor Sitting balance - Comments: Pt pushing from R side, with significant L lateral lean and requires multi modal cues to correct. Pt able to briefly maintain sitting balance (approx 30 sec) with R hand supported on recliner armrest, anterior-lateral to BOS (cues to keep elbow tucked in). Postural control: Left lateral lean;Posterior lean Standing balance support: During functional activity Standing balance-Leahy Scale: Zero Standing balance comment: heavy reliance on external support                            Cognition Arousal/Alertness: Awake/alert Behavior During Therapy: WFL for tasks assessed/performed Overall Cognitive Status: History of cognitive impairments - at baseline Area of Impairment: Memory;Following commands;Safety/judgement;Awareness;Problem solving                 Orientation Level: Disoriented to;Time;Situation Current Attention Level: Sustained Memory: Decreased short-term memory Following Commands: Follows one step commands with increased time;Follows one step commands inconsistently Safety/Judgement: Decreased awareness of safety;Decreased awareness of deficits Awareness: Intellectual  Exercises      General Comments General comments (skin integrity, edema, etc.): VSS      Pertinent Vitals/Pain Pain Assessment: Faces Faces Pain Scale: Hurts little more Pain Location: R neck Pain  Descriptors / Indicators: Discomfort;Tightness;Cramping Pain Intervention(s): Monitored during session;Repositioned;Heat applied    Home Living                      Prior Function            PT Goals (current goals can now be found in the care plan section) Acute Rehab PT Goals Patient Stated Goal: Get stronger and go home. Progress towards PT goals: Progressing toward goals    Frequency    Min 4X/week      PT Plan Current plan remains appropriate    Co-evaluation              AM-PAC PT "6 Clicks" Mobility   Outcome Measure  Help needed turning from your back to your side while in a flat bed without using bedrails?: A Little Help needed moving from lying on your back to sitting on the side of a flat bed without using bedrails?: A Lot Help needed moving to and from a bed to a chair (including a wheelchair)?: A Lot Help needed standing up from a chair using your arms (e.g., wheelchair or bedside chair)?: A Lot Help needed to walk in hospital room?: Total Help needed climbing 3-5 steps with a railing? : Total 6 Click Score: 11    End of Session Equipment Utilized During Treatment: Gait belt Activity Tolerance: Patient tolerated treatment well Patient left: in chair;with call bell/phone within reach;with chair alarm set;with family/visitor present Nurse Communication: Mobility status PT Visit Diagnosis: Unsteadiness on feet (R26.81);Muscle weakness (generalized) (M62.81);Difficulty in walking, not elsewhere classified (R26.2);Hemiplegia and hemiparesis Hemiplegia - Right/Left: Left Hemiplegia - dominant/non-dominant: Non-dominant Hemiplegia - caused by: Nontraumatic intracerebral hemorrhage     Time: 0100-7121 PT Time Calculation (min) (ACUTE ONLY): 28 min  Charges:  $Therapeutic Activity: 23-37 mins                     Aida Raider, PT  Office # [phone] Pager [phone]    Donald [name] 05/14/2021, 9:43 AM

## 2021-05-15 NOTE — Progress Notes (Signed)
PROGRESS NOTE  Donald [name] WUJ:[id number] DOB: February 12, 1956 DOA: 05/11/2021 PCP: [name], MD   LOS: 4 days   Brief narrative: Donald [name] is a 65 y.o. male with a medical history significant for dementia with behavioral disturbance and reported OSA not on CPAP per wife who presented to the ED via EMS for evaluation of acute onset of left hemiparesis, right gaze, and left mouth droop. Donald [name] had woken up in his normal state of health this morning. He and his wife had intercourse this morning and when she got up to get ready for the day, she noticed that Donald [name] was acting "off". She asked him to get out of bed and he stated that he was unable to move his left side and she noticed that he was looking towards the right so she activated EMS. His initial manual blood pressure at 08:15 was 194/104 and on arrival to the hospital around 09:00 his blood pressure was 122/74. On arrival, Donald [name] was noted to have persistent left hemiparesis, right gaze, neck pain, left mouth droop, and decreased sensation on the left and was immediately taken to CT for stroke evaluation. As baseline Donald [name] is able to care for himself at home but has some short term memory deficits and had a scheduled lumbar puncture for outpatient evaluation of possible Alzheimer's Dementia at [facility].   At this time patient is stable and is awaiting for CIR.    Assessment/Plan:  Active Problems:   ICH (intracerebral hemorrhage) (HCC)   Memory deficits   OSA (obstructive sleep apnea)   Generalized OA   Hypokalemia   Leukocytosis  Acute parenchymal hemorrhage in the posterior right frontal lobe with right-sided hemiplegia.  Patient was initially admitted to the ICU for closer monitoring.  Focus on blood pressure control with systolic blood pressure goal of 120-140.  Initially required Cleviprex and labetalol in the ICU.  Hemoglobin A1c of 5.8. Lipid profile noted with total cholesterol 232 and LDL of 140.   MRI of the brain showed posterior right frontal lobe intra-axial hemorrhage with layering and trace subarachnoid hemorrhage.  2D echocardiogram showed preserved LV function at 65 to 70%.  Patient has been seen by physical therapy recommended CIR. neurology recommending repeat CTA head and MRI after the hematoma resolves and recommends outpatient follow-up with [facility] neurology.  Speech therapy has recommended dysphagia 1 diet.   Statin goal for LDL less than 70.  Recommend low-dose statin 20mg  on discharge  Obstructive sleep apnea not on CPAP Will monitor closely.  Hypertensive Urgency  Initial blood pressure was 194/ 104.  Patient received IV drip with adequate blood pressure control.  Currently on amlodipine and labetalol.  Closely monitor.  Goal less than 160.  Initiated HCTZ with improved blood pressure. . Hyperlipidemia.  On statins.  Continue Lipitor 20 mg daily.  History of cognitive dysfunction and dementia.  Follows up with [facility] neurology as outpatient.  He was supposed to have a lumbar puncture as outpatient.  Dysphagia.  On dysphagia 2 diet.  Advance as tolerated as per speech therapy.  Leukocytosis likely reactive.  Normalized at this time.  Hypokalemia improved after replacement.  Latest potassium of 3.8.  We will closely monitor.   DVT prophylaxis: heparin injection 5,000 Units Start: 05/13/21 1400 SCD's Start: 05/11/21 0941   Code Status: Full code  Family Communication:  None today.  I spoke with the patient's wife on the phone and updated her about the clinical condition on 6/3 . Status is: Inpatient  Remains inpatient appropriate because: IV treatments appropriate due to intensity of illness or inability to take PO, Inpatient level of care appropriate due to severity of illness and Need for rehabilitation   Dispo: The patient is from: Home              Anticipated d/c is to: CIR              Patient currently is  medically stable to d/c.   Difficult to place  patient No  Consultants:  Neurology  Procedures:  None  Anti-infectives:  . None  Anti-infectives (From admission, onward)   None     Subjective:  Today, patient was seen and examined at bedside.  Patient denies any nausea vomiting abdominal pain fever chills or rigor.  Objective: Vitals:   05/15/21 0423 05/15/21 0743  BP: 138/80 133/80  Pulse: 73 70  Resp: 18 18  Temp: 97.9 F (36.6 C) 98.1 F (36.7 C)  SpO2: 100% 100%    Intake/Output Summary (Last 24 hours) at 05/15/2021 0942 Last data filed at 05/15/2021 0618 Gross per 24 hour  Intake --  Output 1100 ml  Net -1100 ml   Filed Weights   05/11/21 0800 05/12/21 2230  Weight: 70.4 kg 69.6 kg   Body mass index is 23.33 kg/m.   Physical Exam:  General:  Average built, not in obvious distress HENT:   No scleral pallor or icterus noted. Oral mucosa is moist.  Chest:  Clear breath sounds.  Diminished breath sounds bilaterally. No crackles or wheezes.  CVS: S1 &S2 heard. No murmur.  Regular rate and rhythm. Abdomen: Soft, nontender, nondistended.  Bowel sounds are heard.   Extremities: No cyanosis, clubbing or edema.  Peripheral pulses are palpable.  Right dense hemiplegia Psych: Alert, awake and oriented, normal mood CNS:  No cranial nerve deficits.  Right dense hemiplegia Skin: Warm and dry.  No rashes noted.   Data Review: I have personally reviewed the following laboratory data and studies,  CBC: Recent Labs  Lab 05/11/21 0851 05/11/21 0901 05/12/21 0341 05/13/21 0338 05/14/21 0153  WBC 7.6  --  12.4* 8.1 7.8  NEUTROABS 1.7  --   --   --   --   HGB 14.8 15.0 15.6 15.6 15.5  HCT 45.5 44.0 46.2 47.4 47.0  MCV 91.4  --  89.4 90.1 89.7  PLT 257  --  232 233 237   Basic Metabolic Panel: Recent Labs  Lab 05/11/21 0851 05/11/21 0901 05/12/21 0341 05/13/21 0338 05/14/21 0153  NA 139 142 138 138 138  K 3.5 3.5 3.3* 3.7 3.8  CL 110 107 100 102 105  CO2 22  --  25 25 24   GLUCOSE 156* 154* 139*  116* 108*  BUN 8 11 7* 9 10  CREATININE 1.18 1.10 0.98 1.12 1.03  CALCIUM 8.9  --  9.7 9.5 9.5  MG  --   --   --   --  2.1  PHOS  --   --   --   --  2.9   Liver Function Tests: Recent Labs  Lab 05/11/21 0851 05/12/21 0341  AST 23 24  ALT 17 19  ALKPHOS 94 108  BILITOT 0.7 0.8  PROT 6.7 7.5  ALBUMIN 3.9 4.3   No results for input(s): LIPASE, AMYLASE in the last 168 hours. No results for input(s): AMMONIA in the last 168 hours. Cardiac Enzymes: No results for input(s): CKTOTAL, CKMB, CKMBINDEX, TROPONINI in the last 168 hours. BNP (  last 3 results) No results for input(s): BNP in the last 8760 hours.  ProBNP (last 3 results) No results for input(s): PROBNP in the last 8760 hours.  CBG: Recent Labs  Lab 05/11/21 0850  GLUCAP 162*   Recent Results (from the past 240 hour(s))  Resp Panel by RT-PCR (Flu A&B, Covid) Nasopharyngeal Swab     Status: None   Collection Time: 05/11/21  9:56 AM   Specimen: Nasopharyngeal Swab; Nasopharyngeal(NP) swabs in vial transport medium  Result Value Ref Range Status   SARS Coronavirus 2 by RT PCR NEGATIVE NEGATIVE Final    Comment: (NOTE) SARS-CoV-2 target nucleic acids are NOT DETECTED.  The SARS-CoV-2 RNA is generally detectable in upper respiratory specimens during the acute phase of infection. The lowest concentration of SARS-CoV-2 viral copies this assay can detect is 138 copies/mL. A negative result does not preclude SARS-Cov-2 infection and should not be used as the sole basis for treatment or other patient management decisions. A negative result may occur with  improper specimen collection/handling, submission of specimen other than nasopharyngeal swab, presence of viral mutation(s) within the areas targeted by this assay, and inadequate number of viral copies(<138 copies/mL). A negative result must be combined with clinical observations, patient history, and epidemiological information. The expected result is Negative.  Fact  Sheet for Patients:  BloggerCourse.com  Fact Sheet for Healthcare Providers:  SeriousBroker.it  This test is no t yet approved or cleared by the [place] FDA and  has been authorized for detection and/or diagnosis of SARS-CoV-2 by FDA under an Emergency Use Authorization (EUA). This EUA will remain  in effect (meaning this test can be used) for the duration of the COVID-19 declaration under Section 564(b)(1) of the Act, 21 U.S.C.section 360bbb-3(b)(1), unless the authorization is terminated  or revoked sooner.       Influenza A by PCR NEGATIVE NEGATIVE Final   Influenza B by PCR NEGATIVE NEGATIVE Final    Comment: (NOTE) The Xpert Xpress SARS-CoV-2/FLU/RSV plus assay is intended as an aid in the diagnosis of influenza from Nasopharyngeal swab specimens and should not be used as a sole basis for treatment. Nasal washings and aspirates are unacceptable for Xpert Xpress SARS-CoV-2/FLU/RSV testing.  Fact Sheet for Patients: BloggerCourse.com  Fact Sheet for Healthcare Providers: SeriousBroker.it  This test is not yet approved or cleared by the [place] FDA and has been authorized for detection and/or diagnosis of SARS-CoV-2 by FDA under an Emergency Use Authorization (EUA). This EUA will remain in effect (meaning this test can be used) for the duration of the COVID-19 declaration under Section 564(b)(1) of the Act, 21 U.S.C. section 360bbb-3(b)(1), unless the authorization is terminated or revoked.  Performed at [facility], [address], [place], [place] [address]   MRSA PCR Screening     Status: None   Collection Time: 05/11/21 11:05 AM   Specimen: Nasopharyngeal  Result Value Ref Range Status   MRSA by PCR NEGATIVE NEGATIVE Final    Comment:        The GeneXpert MRSA Assay (FDA approved for NASAL specimens only), is one component of a comprehensive  MRSA colonization surveillance program. It is not intended to diagnose MRSA infection nor to guide or monitor treatment for MRSA infections. Performed at [facility], [address], [place], [place] [address]      Studies: No results found.  Joycelyn Das, MD  [facility] 05/15/2021  If 7PM-7AM, please contact night-coverage

## 2021-05-15 NOTE — Plan of Care (Signed)
  Problem: Education: Goal: Knowledge of General Education information will improve Description: Including pain rating scale, medication(s)/side effects and non-pharmacologic comfort measures Outcome: Progressing   Problem: Health Behavior/Discharge Planning: Goal: Ability to manage health-related needs will improve Outcome: Progressing   Problem: Clinical Measurements: Goal: Ability to maintain clinical measurements within normal limits will improve Outcome: Progressing Goal: Will remain free from infection Outcome: Progressing Goal: Diagnostic test results will improve Outcome: Progressing Goal: Respiratory complications will improve Outcome: Progressing Goal: Cardiovascular complication will be avoided Outcome: Progressing   Problem: Activity: Goal: Risk for activity intolerance will decrease Outcome: Progressing   Problem: Nutrition: Goal: Adequate nutrition will be maintained Outcome: Progressing   Problem: Coping: Goal: Level of anxiety will decrease Outcome: Progressing   Problem: Elimination: Goal: Will not experience complications related to bowel motility Outcome: Progressing Goal: Will not experience complications related to urinary retention Outcome: Progressing   Problem: Pain Managment: Goal: General experience of comfort will improve Outcome: Progressing   Problem: Safety: Goal: Ability to remain free from injury will improve Outcome: Progressing   Problem: Skin Integrity: Goal: Risk for impaired skin integrity will decrease Outcome: Progressing   Problem: Education: Goal: Knowledge of disease or condition will improve Outcome: Progressing Goal: Knowledge of secondary prevention will improve Outcome: Progressing Goal: Knowledge of patient specific risk factors addressed and post discharge goals established will improve Outcome: Progressing Goal: Individualized Educational Video(s) Outcome: Progressing   Problem: Coping: Goal: Will verbalize  positive feelings about self Outcome: Progressing Goal: Will identify appropriate support needs Outcome: Progressing   Problem: Health Behavior/Discharge Planning: Goal: Ability to manage health-related needs will improve Outcome: Progressing   Problem: Self-Care: Goal: Ability to participate in self-care as condition permits will improve Outcome: Progressing Goal: Verbalization of feelings and concerns over difficulty with self-care will improve Outcome: Progressing Goal: Ability to communicate needs accurately will improve Outcome: Progressing   Problem: Nutrition: Goal: Risk of aspiration will decrease Outcome: Progressing Goal: Dietary intake will improve Outcome: Progressing   Problem: Intracerebral Hemorrhage Tissue Perfusion: Goal: Complications of Intracerebral Hemorrhage will be minimized Outcome: Progressing   Problem: Spontaneous Subarachnoid Hemorrhage Tissue Perfusion: Goal: Complications of Spontaneous Subarachnoid Hemorrhage will be minimized Outcome: Progressing   Problem: Intracerebral Hemorrhage Tissue Perfusion: Goal: Complications of Intracerebral Hemorrhage will be minimized Outcome: Progressing

## 2021-05-16 DIAGNOSIS — Z95828 Presence of other vascular implants and grafts: Secondary | ICD-10-CM | POA: Insufficient documentation

## 2021-05-16 NOTE — Progress Notes (Signed)
PROGRESS NOTE  Donald [name] SWN:[id number] DOB: Oct 15, 1956 DOA: 05/11/2021 PCP: [name], MD   LOS: 5 days   Brief narrative: Donald [name] is a 65 y.o. male with a medical history significant for dementia with behavioral disturbance and reported OSA not on CPAP per wife who presented to the ED via EMS for evaluation of acute onset of left hemiparesis, right gaze, and left mouth droop. Donald [name] had woken up in his normal state of health this morning. He and his wife had intercourse this morning and when she got up to get ready for the day, she noticed that Donald [name] was acting "off". She asked him to get out of bed and he stated that he was unable to move his left side and she noticed that he was looking towards the right so she activated EMS. His initial manual blood pressure at 08:15 was 194/104 and on arrival to the hospital around 09:00 his blood pressure was 122/74. On arrival, Donald [name] was noted to have persistent left hemiparesis, right gaze, neck pain, left mouth droop, and decreased sensation on the left and was immediately taken to CT for stroke evaluation. As baseline Donald [name] is able to care for himself at home but has some short term memory deficits and had a scheduled lumbar puncture for outpatient evaluation of possible Alzheimer's Dementia at [facility]. At this time patient is stable and is awaiting for CIR placement..    Assessment/Plan:  Active Problems:   ICH (intracerebral hemorrhage) (HCC)   Memory deficits   OSA (obstructive sleep apnea)   Generalized OA   Hypokalemia   Leukocytosis  Acute parenchymal hemorrhage in the posterior right frontal lobe with right-sided hemiplegia.  Patient was initially admitted to the ICU for closer monitoring.  Focus on blood pressure control with systolic blood pressure goal of 120-140.  Initially required Cleviprex and labetalol in the ICU.  Hemoglobin A1c of 5.8. Lipid profile noted with total cholesterol 232 and LDL  of 140.  MRI of the brain showed posterior right frontal lobe intra-axial hemorrhage with layering and trace subarachnoid hemorrhage.  2D echocardiogram showed preserved LV function at 65 to 70%.  Patient has been seen by physical therapy recommended CIR. neurology recommending repeat CTA head and MRI after the hematoma resolves and recommends outpatient follow-up with [facility] neurology.  Speech therapy has recommended dysphagia 2 diet.   Statin goal for LDL less than 70.  Recommend low-dose statin 20mg  on discharge  Obstructive sleep apnea not on CPAP Will monitor closely.  Hypertensive Urgency  Initial blood pressure was 194/ 104.  Patient received IV drip with adequate blood pressure control.  Currently on amlodipine and labetalol.  Closely monitor.  Goal less than 160.  Initiated HCTZ with improved blood pressure. . Hyperlipidemia.  On statins.  Continue Lipitor 20 mg daily.  History of cognitive dysfunction and dementia.  Follows up with [facility] neurology as outpatient.  He was supposed to have a lumbar puncture as outpatient.  Dysphagia.  On dysphagia 2 diet.  Advance as tolerated as per speech therapy.  Leukocytosis likely reactive.  Normalized at this time.  Hypokalemia improved after replacement.  Latest potassium of 3.8.  Monitor BMP.  DVT prophylaxis: heparin injection 5,000 Units Start: 05/13/21 1400 SCD's Start: 05/11/21 0941   Code Status: Full code  Family Communication:    I spoke with the patient's wife  on 6/3 . Status is: Inpatient  Remains inpatient appropriate because: IV treatments appropriate due to intensity of illness or  inability to take PO, Inpatient level of care appropriate due to severity of illness and Need for rehabilitation   Dispo: The patient is from: Home              Anticipated d/c is to: CIR              Patient currently is  medically stable to d/c.   Difficult to place patient  No  Consultants:  Neurology  Procedures:  None  Anti-infectives:  . None  Anti-infectives (From admission, onward)   None     Subjective: Today, patient was seen and examined at bedside.  Feels okay.  Denies any interval complaints.  Denies any headache, nausea, vomiting, fever chills  Objective: Vitals:   05/16/21 0346 05/16/21 0930  BP: 132/89 137/90  Pulse: 79   Resp: 18   Temp: 98.3 F (36.8 C) 98 F (36.7 C)  SpO2: 100% 95%    Intake/Output Summary (Last 24 hours) at 05/16/2021 1408 Last data filed at 05/16/2021 1100 Gross per 24 hour  Intake 510 ml  Output 1300 ml  Net -790 ml   Filed Weights   05/11/21 0800 05/12/21 2230  Weight: 70.4 kg 69.6 kg   Body mass index is 23.33 kg/m.   Physical Exam:  General:  Average built, not in obvious distress HENT:   No scleral pallor or icterus noted. Oral mucosa is moist.  Chest:  Clear breath sounds.  Diminished breath sounds bilaterally. No crackles or wheezes.  CVS: S1 &S2 heard. No murmur.  Regular rate and rhythm. Abdomen: Soft, nontender, nondistended.  Bowel sounds are heard.   Extremities: No cyanosis, clubbing or edema.  Peripheral pulses are palpable.  Right dense hemiplegia. Psych: Alert, awake and oriented, normal mood CNS: Speech distinct.  Dense right hemiplegia. Skin: Warm and dry.  No rashes noted.   Data Review: I have personally reviewed the following laboratory data and studies,  CBC: Recent Labs  Lab 05/11/21 0851 05/11/21 0901 05/12/21 0341 05/13/21 0338 05/14/21 0153  WBC 7.6  --  12.4* 8.1 7.8  NEUTROABS 1.7  --   --   --   --   HGB 14.8 15.0 15.6 15.6 15.5  HCT 45.5 44.0 46.2 47.4 47.0  MCV 91.4  --  89.4 90.1 89.7  PLT 257  --  232 233 237   Basic Metabolic Panel: Recent Labs  Lab 05/11/21 0851 05/11/21 0901 05/12/21 0341 05/13/21 0338 05/14/21 0153  NA 139 142 138 138 138  K 3.5 3.5 3.3* 3.7 3.8  CL 110 107 100 102 105  CO2 22  --  25 25 24   GLUCOSE 156* 154* 139*  116* 108*  BUN 8 11 7* 9 10  CREATININE 1.18 1.10 0.98 1.12 1.03  CALCIUM 8.9  --  9.7 9.5 9.5  MG  --   --   --   --  2.1  PHOS  --   --   --   --  2.9   Liver Function Tests: Recent Labs  Lab 05/11/21 0851 05/12/21 0341  AST 23 24  ALT 17 19  ALKPHOS 94 108  BILITOT 0.7 0.8  PROT 6.7 7.5  ALBUMIN 3.9 4.3   No results for input(s): LIPASE, AMYLASE in the last 168 hours. No results for input(s): AMMONIA in the last 168 hours. Cardiac Enzymes: No results for input(s): CKTOTAL, CKMB, CKMBINDEX, TROPONINI in the last 168 hours. BNP (last 3 results) No results for input(s): BNP in the last 8760  hours.  ProBNP (last 3 results) No results for input(s): PROBNP in the last 8760 hours.  CBG: Recent Labs  Lab 05/11/21 0850  GLUCAP 162*   Recent Results (from the past 240 hour(s))  Resp Panel by RT-PCR (Flu A&B, Covid) Nasopharyngeal Swab     Status: None   Collection Time: 05/11/21  9:56 AM   Specimen: Nasopharyngeal Swab; Nasopharyngeal(NP) swabs in vial transport medium  Result Value Ref Range Status   SARS Coronavirus 2 by RT PCR NEGATIVE NEGATIVE Final    Comment: (NOTE) SARS-CoV-2 target nucleic acids are NOT DETECTED.  The SARS-CoV-2 RNA is generally detectable in upper respiratory specimens during the acute phase of infection. The lowest concentration of SARS-CoV-2 viral copies this assay can detect is 138 copies/mL. A negative result does not preclude SARS-Cov-2 infection and should not be used as the sole basis for treatment or other patient management decisions. A negative result may occur with  improper specimen collection/handling, submission of specimen other than nasopharyngeal swab, presence of viral mutation(s) within the areas targeted by this assay, and inadequate number of viral copies(<138 copies/mL). A negative result must be combined with clinical observations, patient history, and epidemiological information. The expected result is Negative.  Fact  Sheet for Patients:  BloggerCourse.com  Fact Sheet for Healthcare Providers:  SeriousBroker.it  This test is no t yet approved or cleared by the [place] FDA and  has been authorized for detection and/or diagnosis of SARS-CoV-2 by FDA under an Emergency Use Authorization (EUA). This EUA will remain  in effect (meaning this test can be used) for the duration of the COVID-19 declaration under Section 564(b)(1) of the Act, 21 U.S.C.section 360bbb-3(b)(1), unless the authorization is terminated  or revoked sooner.       Influenza A by PCR NEGATIVE NEGATIVE Final   Influenza B by PCR NEGATIVE NEGATIVE Final    Comment: (NOTE) The Xpert Xpress SARS-CoV-2/FLU/RSV plus assay is intended as an aid in the diagnosis of influenza from Nasopharyngeal swab specimens and should not be used as a sole basis for treatment. Nasal washings and aspirates are unacceptable for Xpert Xpress SARS-CoV-2/FLU/RSV testing.  Fact Sheet for Patients: BloggerCourse.com  Fact Sheet for Healthcare Providers: SeriousBroker.it  This test is not yet approved or cleared by the [place] FDA and has been authorized for detection and/or diagnosis of SARS-CoV-2 by FDA under an Emergency Use Authorization (EUA). This EUA will remain in effect (meaning this test can be used) for the duration of the COVID-19 declaration under Section 564(b)(1) of the Act, 21 U.S.C. section 360bbb-3(b)(1), unless the authorization is terminated or revoked.  Performed at [facility], [address], [place], [place] [address]   MRSA PCR Screening     Status: None   Collection Time: 05/11/21 11:05 AM   Specimen: Nasopharyngeal  Result Value Ref Range Status   MRSA by PCR NEGATIVE NEGATIVE Final    Comment:        The GeneXpert MRSA Assay (FDA approved for NASAL specimens only), is one component of a comprehensive  MRSA colonization surveillance program. It is not intended to diagnose MRSA infection nor to guide or monitor treatment for MRSA infections. Performed at [facility], [address] [place], [place] [address]      Studies: No results found.  Joycelyn Das, MD  [facility] 05/16/2021  If 7PM-7AM, please contact night-coverage

## 2021-05-17 MED ORDER — MAGNESIUM CITRATE PO SOLN
1.0000 | Freq: Once | ORAL | Status: AC
  Administered 2021-05-17: 1 via ORAL
  Filled 2021-05-17: qty 296

## 2021-05-17 NOTE — Progress Notes (Signed)
Physical Therapy Treatment Patient Details Name: Donald [name] MRN: [id number] DOB: 1956-08-16 Today's Date: 05/17/2021    History of Present Illness 65 y.o. M who presents with right gaze and left hemiplegia. 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 Significant PMH: memory deficits, OSA.    PT Comments    Pt pleasant with maintained left neglect with max cues to attend to left and with visual targets with progressive movement pt attending but needs reinforcement and repepitition.  Pt able to stand for 1 min today with left knee blocked and one person assist but continues to have difficulty weight shifting toward right for transfers. Continued work and progression on sitting and standing balance as well as bed mobility. Wife present end of session.    Follow Up Recommendations  CIR     Equipment Recommendations  3in1 (PT);Wheelchair (measurements PT);Wheelchair cushion (measurements PT);Hospital bed    Recommendations for Other Services       Precautions / Restrictions Precautions Precautions: Fall Precaution Comments: L hemiparesis, L neglect, BP < 160    Mobility  Bed Mobility Overal bed mobility: Needs Assistance Bed Mobility: Rolling;Sidelying to Sit Rolling: Min assist Sidelying to sit: Mod assist       General bed mobility comments: cues to bend right knee, look to left and reach for rail. assist to hook LLE with RLE with additional mod assist to clear legs from bed and elevate trunk. With RUE on foot rail able to acheive sitting but if RUE on surface pt pushes left. Pt with anterior left lean with max cues and min assist to correct EOB grossly 8 min    Transfers Overall transfer level: Needs assistance   Transfers: Sit to/from Stand;Stand Pivot Transfers Sit to Stand: From elevated surface Stand pivot transfers: Mod assist       General transfer comment: mod assist to rise from bed and 2nd trials from chair with left knee blocked, LUE hooked  and use of belt to stand. Pt stood 1 min with left lean and max multimodal cues for upright and midline posture without UE support. max assist to pivot toward right. 2nd stand only momentary  Ambulation/Gait             General Gait Details: will require 2 person assist   Stairs             Wheelchair Mobility    Modified Rankin (Stroke Patients Only) Modified Rankin (Stroke Patients Only) Pre-Morbid Rankin Score: No symptoms Modified Rankin: Severe disability     Balance Overall balance assessment: Needs assistance Sitting-balance support: Feet supported;Single extremity supported Sitting balance-Leahy Scale: Poor Sitting balance - Comments: anterior left lean with RUE support on rail, min assist with max cues   Standing balance support: No upper extremity supported Standing balance-Leahy Scale: Poor Standing balance comment: mod assist to maintain standing with left knee blocked and left lean                            Cognition Arousal/Alertness: Awake/alert Behavior During Therapy: WFL for tasks assessed/performed Overall Cognitive Status: Impaired/Different from baseline Area of Impairment: Memory;Attention;Safety/judgement                   Current Attention Level: Sustained Memory: Decreased short-term memory Following Commands: Follows one step commands with increased time;Follows one step commands inconsistently Safety/Judgement: Decreased awareness of safety;Decreased awareness of deficits   Problem Solving: Requires verbal cues;Requires tactile cues;Slow  processing;Decreased initiation;Difficulty sequencing General Comments: pt maintains poor awareness of deficits particularly with left inattention and neglect. Mod cues throughout for attention and balance      Exercises General Exercises - Lower Extremity Long Arc Quad: PROM;Right;Seated;5 reps    General Comments        Pertinent Vitals/Pain Pain Assessment: No/denies  pain    Home Living                      Prior Function            PT Goals (current goals can now be found in the care plan section) Progress towards PT goals: Progressing toward goals    Frequency    Min 4X/week      PT Plan Current plan remains appropriate    Co-evaluation              AM-PAC PT "6 Clicks" Mobility   Outcome Measure  Help needed turning from your back to your side while in a flat bed without using bedrails?: A Little Help needed moving from lying on your back to sitting on the side of a flat bed without using bedrails?: A Lot Help needed moving to and from a bed to a chair (including a wheelchair)?: Total Help needed standing up from a chair using your arms (e.g., wheelchair or bedside chair)?: A Lot Help needed to walk in hospital room?: Total Help needed climbing 3-5 steps with a railing? : Total 6 Click Score: 10    End of Session Equipment Utilized During Treatment: Gait belt Activity Tolerance: Patient tolerated treatment well Patient left: in chair;with call bell/phone within reach;with chair alarm set;with family/visitor present Nurse Communication: Mobility status;Precautions PT Visit Diagnosis: Unsteadiness on feet (R26.81);Muscle weakness (generalized) (M62.81);Difficulty in walking, not elsewhere classified (R26.2);Hemiplegia and hemiparesis Hemiplegia - dominant/non-dominant: Non-dominant Hemiplegia - caused by: Nontraumatic intracerebral hemorrhage     Time: 1325-1348 PT Time Calculation (min) (ACUTE ONLY): 23 min  Charges:  $Therapeutic Activity: 8-22 mins $Neuromuscular Re-education: 8-22 mins                     Donald [name] P, PT Acute Rehabilitation Services Pager: [phone] Office: [phone]    Donald [name] B Donald [name] 05/17/2021, 2:02 PM

## 2021-05-17 NOTE — Progress Notes (Signed)
PROGRESS NOTE  Donald [name] UJW:[id number] DOB: 11/01/1956 DOA: 05/11/2021 PCP: Shade FloodGreene, Jeffrey R, MD   LOS: 6 days   Brief narrative: Donald Leftmmanuel Goldberger is a 65 y.o. male with a medical history significant for dementia with behavioral disturbance and reported OSA not on CPAP per wife who presented to the ED via EMS for evaluation of acute onset of left hemiparesis, right gaze, and left mouth droop. Donald [name] had woken up in his normal state of health this morning. He and his wife had intercourse this morning and when she got up to get ready for the day, she noticed that Donald [name] was acting "off". She asked him to get out of bed and he stated that he was unable to move his left side and she noticed that he was looking towards the right so she activated EMS. His initial manual blood pressure at 08:15 was 194/104 and on arrival to the hospital around 09:00 his blood pressure was 122/74. On arrival, Donald [name] was noted to have persistent left hemiparesis, right gaze, neck pain, left mouth droop, and decreased sensation on the left and was immediately taken to CT for stroke evaluation. As baseline Donald [name] is able to care for himself at home but has some short term memory deficits and had a scheduled lumbar puncture for outpatient [facility]. At this time, patient is stable and is awaiting for CIR placement..    Assessment/Plan:  Active Problems:   ICH (intracerebral hemorrhage) (HCC)   Memory deficits   OSA (obstructive sleep apnea)   Generalized OA   Hypokalemia   Leukocytosis  Acute parenchymal hemorrhage in the posterior right frontal lobe with right-sided hemiplegia. Patient was initially admitted to the ICU for closer monitoring.  Focus on blood pressure control with systolic blood pressure goal of 120-140.  Initially required Cleviprex and labetalol in the ICU.  Hemoglobin A1c of 5.8. Lipid profile noted with total cholesterol 232 and LDL  of 140.  MRI of the brain showed posterior right frontal lobe intra-axial hemorrhage with layering and trace subarachnoid hemorrhage.  2D echocardiogram showed preserved LV function at 65 to 70%.  Patient has been seen by physical therapy recommended CIR. neurology recommending repeat CTA head and MRI after the hematoma resolves and recommends [facility].  Speech therapy has recommended dysphagia 2 diet.   Statin goal for LDL less than 70.  Recommend low-dose statin 20mg  on discharge.  Obstructive sleep apnea not on CPAP Will monitor closely.  Hypertensive Urgency  Initial blood pressure was 194/ 104.  Patient received IV drip with adequate blood pressure control.  Currently on amlodipine and labetalol.  Closely monitor.  Goal less than 160.  Initiated HCTZ with improved blood pressure.  Latest blood pressure of 119/86  Hyperlipidemia.  On statins.  Continue Lipitor 20 mg daily.  History of cognitive dysfunction and dementia.  [facility].  He was supposed to have a lumbar puncture as outpatient.  Dysphagia.  On dysphagia 2 diet.  Advance as tolerated as per speech therapy.  Leukocytosis likely reactive.  Normalized at this time.  Hypokalemia improved after replacement.  Latest potassium of 3.8.  Monitor BMP.  Constipation.  We will add mag citrate x1.  Already on Senokot and MiraLAX.  DVT prophylaxis: heparin injection 5,000 Units Start: 05/13/21 1400 SCD's Start: 05/11/21 0941   Code Status: Full code  Family Communication:   I spoke with the patient's wife on 05/17/21 .  Status is: Inpatient  Remains inpatient appropriate because: IV treatments appropriate due to intensity of illness or inability to take PO, Inpatient level of care appropriate due to severity of illness and Need for rehabilitation   Dispo: The patient is from: Home              Anticipated d/c is to: CIR              Patient currently is  medically  stable to d/c.   Difficult to place patient No  Consultants:  Neurology  Procedures:  None  Anti-infectives:  . None  Anti-infectives (From admission, onward)   None     Subjective: Today, patient was seen and examined at bedside.  Denies any headache, nausea, vomiting, fever or chills.  Has not had a bowel movement in few days.  Objective: Vitals:   05/17/21 0909 05/17/21 1241  BP: 118/81 119/86  Pulse: 69 85  Resp: 18 17  Temp: 98.1 F (36.7 C) 98.6 F (37 C)  SpO2: 100% 100%    Intake/Output Summary (Last 24 hours) at 05/17/2021 1335 Last data filed at 05/17/2021 1303 Gross per 24 hour  Intake 356 ml  Output 1450 ml  Net -1094 ml   Filed Weights   05/11/21 0800 05/12/21 2230  Weight: 70.4 kg 69.6 kg   Body mass index is 23.33 kg/m.   Physical Exam:  General:  Average built, not in obvious distress HENT:   No scleral pallor or icterus noted. Oral mucosa is moist.  Chest:  Clear breath sounds.  Diminished breath sounds bilaterally. No crackles or wheezes.  CVS: S1 &S2 heard. No murmur.  Regular rate and rhythm. Abdomen: Soft, nontender, nondistended.  Bowel sounds are heard.   Extremities: No cyanosis, clubbing or edema.  Peripheral pulses are palpable. Psych: Alert, awake and oriented, normal mood CNS:  No cranial nerve deficits.  Right sided hemiplegia. Skin: Warm and dry.  No rashes noted.   Data Review: I have personally reviewed the following laboratory data and studies,  CBC: Recent Labs  Lab 05/11/21 0851 05/11/21 0901 05/12/21 0341 05/13/21 0338 05/14/21 0153  WBC 7.6  --  12.4* 8.1 7.8  NEUTROABS 1.7  --   --   --   --   HGB 14.8 15.0 15.6 15.6 15.5  HCT 45.5 44.0 46.2 47.4 47.0  MCV 91.4  --  89.4 90.1 89.7  PLT 257  --  232 233 237   Basic Metabolic Panel: Recent Labs  Lab 05/11/21 0851 05/11/21 0901 05/12/21 0341 05/13/21 0338 05/14/21 0153  NA 139 142 138 138 138  K 3.5 3.5 3.3* 3.7 3.8  CL 110 107 100 102 105  CO2 22   --  25 25 24   GLUCOSE 156* 154* 139* 116* 108*  BUN 8 11 7* 9 10  CREATININE 1.18 1.10 0.98 1.12 1.03  CALCIUM 8.9  --  9.7 9.5 9.5  MG  --   --   --   --  2.1  PHOS  --   --   --   --  2.9   Liver Function Tests: Recent Labs  Lab 05/11/21 0851 05/12/21 0341  AST 23 24  ALT 17 19  ALKPHOS 94 108  BILITOT 0.7 0.8  PROT 6.7 7.5  ALBUMIN 3.9 4.3   No results for input(s): LIPASE, AMYLASE in the last 168 hours. No results for input(s): AMMONIA in the last 168 hours. Cardiac Enzymes: No results for input(s): CKTOTAL, CKMB, CKMBINDEX,  TROPONINI in the last 168 hours. BNP (last 3 results) No results for input(s): BNP in the last 8760 hours.  ProBNP (last 3 results) No results for input(s): PROBNP in the last 8760 hours.  CBG: Recent Labs  Lab 05/11/21 0850  GLUCAP 162*   Recent Results (from the past 240 hour(s))  Resp Panel by RT-PCR (Flu A&B, Covid) Nasopharyngeal Swab     Status: None   Collection Time: 05/11/21  9:56 AM   Specimen: Nasopharyngeal Swab; Nasopharyngeal(NP) swabs in vial transport medium  Result Value Ref Range Status   SARS Coronavirus 2 by RT PCR NEGATIVE NEGATIVE Final    Comment: (NOTE) SARS-CoV-2 target nucleic acids are NOT DETECTED.  The SARS-CoV-2 RNA is generally detectable in upper respiratory specimens during the acute phase of infection. The lowest concentration of SARS-CoV-2 viral copies this assay can detect is 138 copies/mL. A negative result does not preclude SARS-Cov-2 infection and should not be used as the sole basis for treatment or other patient management decisions. A negative result may occur with  improper specimen collection/handling, submission of specimen other than nasopharyngeal swab, presence of viral mutation(s) within the areas targeted by this assay, and inadequate number of viral copies(<138 copies/mL). A negative result must be combined with clinical observations, patient history, and epidemiological information.  The expected result is Negative.  Fact Sheet for Patients:  BloggerCourse.com  Fact Sheet for Healthcare Providers:  SeriousBroker.it  This test is no t yet approved or cleared by the [place] FDA and  has been authorized for detection and/or diagnosis of SARS-CoV-2 by FDA under an Emergency Use Authorization (EUA). This EUA will remain  in effect (meaning this test can be used) for the duration of the COVID-19 declaration under Section 564(b)(1) of the Act, 21 U.S.C.section 360bbb-3(b)(1), unless the authorization is terminated  or revoked sooner.       Influenza A by PCR NEGATIVE NEGATIVE Final   Influenza B by PCR NEGATIVE NEGATIVE Final    Comment: (NOTE) The Xpert Xpress SARS-CoV-2/FLU/RSV plus assay is intended as an aid in the diagnosis of influenza from Nasopharyngeal swab specimens and should not be used as a sole basis for treatment. Nasal washings and aspirates are unacceptable for Xpert Xpress SARS-CoV-2/FLU/RSV testing.  Fact Sheet for Patients: BloggerCourse.com  Fact Sheet for Healthcare Providers: SeriousBroker.it  This test is not yet approved or cleared by the [place] FDA and has been authorized for detection and/or diagnosis of SARS-CoV-2 by FDA under an Emergency Use Authorization (EUA). This EUA will remain in effect (meaning this test can be used) for the duration of the COVID-19 declaration under Section 564(b)(1) of the Act, 21 U.S.C. section 360bbb-3(b)(1), unless the authorization is terminated or revoked.  Performed at [facility], [address], [place], [place] [address]   MRSA PCR Screening     Status: None   Collection Time: 05/11/21 11:05 AM   Specimen: Nasopharyngeal  Result Value Ref Range Status   MRSA by PCR NEGATIVE NEGATIVE Final    Comment:        The GeneXpert MRSA Assay (FDA approved for NASAL specimens only),  is one component of a comprehensive MRSA colonization surveillance program. It is not intended to diagnose MRSA infection nor to guide or monitor treatment for MRSA infections. Performed at [facility], [address], [place], [place] [address]      Studies: No results found.  Joycelyn Das, MD  [facility] 05/17/2021  If 7PM-7AM, please contact  night-coverage

## 2021-05-17 NOTE — Progress Notes (Signed)
Inpatient Rehab Admissions Coordinator:   I have insurance authorization for CIR, but do not have a bed available for this patient to admit today.  Will continue to follow for potential admission pending bed availability.   Estill Dooms, PT, DPT Admissions Coordinator [phone] 05/17/21  3:32 PM

## 2021-05-17 NOTE — Progress Notes (Signed)
Inpatient Rehab Admissions Coordinator:   Awaiting determination from insurance regarding CIR authorization request.   Estill Dooms, PT, DPT Admissions Coordinator [phone] 05/17/21  10:43 AM

## 2021-05-18 LAB — CBC
HCT: 50.1 % (ref 39.0–52.0)
Hemoglobin: 17 g/dL (ref 13.0–17.0)
MCH: 30.1 pg (ref 26.0–34.0)
MCHC: 33.9 g/dL (ref 30.0–36.0)
MCV: 88.8 fL (ref 80.0–100.0)
Platelets: 257 10*3/uL (ref 150–400)
RBC: 5.64 MIL/uL (ref 4.22–5.81)
RDW: 12.9 % (ref 11.5–15.5)
WBC: 6.6 10*3/uL (ref 4.0–10.5)
nRBC: 0 % (ref 0.0–0.2)

## 2021-05-18 LAB — BASIC METABOLIC PANEL
Anion gap: 11 (ref 5–15)
BUN: 22 mg/dL (ref 8–23)
CO2: 28 mmol/L (ref 22–32)
Calcium: 10.1 mg/dL (ref 8.9–10.3)
Chloride: 95 mmol/L — ABNORMAL LOW (ref 98–111)
Creatinine, Ser: 1.23 mg/dL (ref 0.61–1.24)
GFR, Estimated: >60 mL/min (ref >60)
Glucose, Bld: 116 mg/dL — ABNORMAL HIGH (ref 70–99)
Potassium: 4.6 mmol/L (ref 3.5–5.1)
Sodium: 134 mmol/L — ABNORMAL LOW (ref 135–145)

## 2021-05-18 LAB — MAGNESIUM: Magnesium: 2.7 mg/dL — ABNORMAL HIGH (ref 1.7–2.4)

## 2021-05-18 MED ORDER — POLYETHYLENE GLYCOL 3350 17 G PO PACK
17.0000 g | PACK | Freq: Two times a day (BID) | ORAL | Status: DC
  Administered 2021-05-18 – 2021-05-19 (×4): 17 g via ORAL
  Filled 2021-05-18 (×5): qty 1

## 2021-05-18 NOTE — Progress Notes (Signed)
  Speech Language Pathology Treatment: Dysphagia;Cognitive-Linquistic  Patient Details Name: Donald [name] MRN: [id number] DOB: Nov 27, 1956 Today's Date: 05/18/2021 Time: 3614-4315 SLP Time Calculation (min) (ACUTE ONLY): 24 min  Assessment / Plan / Recommendation Clinical Impression  Pt slumped and about to fall out of chair upon SLP arrival. This clinician promptly called nursing for assistance in postioning pt upright for safe PO intake. With pm meal at bedside, pt consumed true dysphagia 2 solids and thin liquids exhibiting pocketing in L lateral sulcus despite independent attempts to clear with liquid wash and lingual sweep. Provided minimal verbal cues for finger sweep pt able to fully clear. In subsequent trials, instructed pt to place bolus on R side of oral cavity in order to decrease amount of pocketing on L side. Pt did so, clearing majority of residuals in L buccal cavity with liquid wash, lingual and finger sweep as needed. Recommend pt to continue on dysphagia 2/thin liquid diet.  Post PO trials, instructed pt on use of external memory aids and environmental cues to increase orientation to time and place. Given min-mod multimodal cues pt identified this information using aids with 50% accuracy. Wife entered room at end of session and educated regarding diet recommendations and compensatory strategies. She agreed to all education and recommendations this date. Will f/u.     HPI HPI: Donald [name] is a 65 y.o. male with a medical history significant for dementia with behavioral disturbance, OSA, arthritis who presented to the ED via EMS for evaluation of acute onset of left hemiparesis, right gaze, and left mouth droop. CT acute parenchymal hemorrhage in the posterior right frontal lobe with small amount of adjacent subarachnoid hemorrhage. Per chart documentation at baseline pt cares for himself with some short term memory deficits and had a scheduled lumbar puncture today for outpatient  evaluation of possible Alzheimer's D[facility] at [facility]. Failed [facility] screen due to facial droop and significant leakage.      SLP Plan  Continue with current plan of care       Recommendations  Diet recommendations: Dysphagia 2 (fine chop);Thin liquid Liquids provided via: Cup;Straw Medication Administration: Crushed with puree Supervision: Staff to assist with self feeding;Full supervision/cueing for compensatory strategies Compensations: Minimize environmental distractions;Slow rate;Small sips/bites;Lingual sweep for clearance of pocketing;Follow solids with liquid Postural Changes and/or Swallow Maneuvers: Seated upright 90 degrees                Oral Care Recommendations: Oral care BID Follow up Recommendations: Inpatient Rehab SLP Visit Diagnosis: Dysphagia, oral phase (R13.11);Cognitive communication deficit (R41.841) Plan: Continue with current plan of care       GO              Avie Echevaria, MA, CCC-SLP Acute Rehabilitation Services Office Number: [phone]   Paulette Blanch 05/18/2021, 2:11 PM

## 2021-05-18 NOTE — Progress Notes (Signed)
Physical Therapy Treatment Patient Details Name: Donald [name] MRN: [id number] DOB: 1955-12-19 Today's Date: 05/18/2021    History of Present Illness 65 y.o. M who presents with right gaze and left hemiplegia. 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 Significant PMH: memory deficits, OSA.    PT Comments    Pt motivated to get up and sit on [facility], felt that he needed to have a BM. Max A +2 for first stand and pivot to [facility] with L knee blocked. Pt stood with mod A +2 from [facility] and was able to perform standing pregait activities x 3 mins. Manual facilitation needed to step R foot towards recliner to pivot and sit. Continue to rec CIR at d/c. PT will continue to follow.    Follow Up Recommendations  CIR     Equipment Recommendations  3in1 (PT);Wheelchair (measurements PT);Wheelchair cushion (measurements PT);Hospital bed    Recommendations for Other Services Rehab consult     Precautions / Restrictions Precautions Precautions: Fall Precaution Comments: L hemiparesis, L neglect, BP < 160, mild pusher with R Restrictions Weight Bearing Restrictions: No    Mobility  Bed Mobility Overal bed mobility: Needs Assistance Bed Mobility: Rolling;Sidelying to Sit Rolling: Min assist Sidelying to sit: Mod assist       General bed mobility comments: pt needed tactile and vc's to initiate and keep moving through transitions. Assist to hook LLE with R foot. Mod A for LE's off EOB. Mod HHA for SL to sit    Transfers Overall transfer level: Needs assistance Equipment used: 2 person hand held assist Transfers: Sit to/from Stand;Stand Pivot Transfers Sit to Stand: +2 physical assistance;Max assist Stand pivot transfers: +2 physical assistance;Max assist       General transfer comment: pt stood and pivoted bed to [facility] then BSC to recliner. First stand pt needed max A +2 for power up and did not gain full balance in standing, needed max facilitation at hips to shift to R to  sit. With standing from [facility] pt needed mod A +2  and was able to come to full upright posture and maintain >1 min but needed max A to step R foot toward chair to sit. Pt needed L knee blocked for both transfers  Ambulation/Gait             General Gait Details: pregait wt shifting in standing   Stairs             Wheelchair Mobility    Modified Rankin (Stroke Patients Only) Modified Rankin (Stroke Patients Only) Pre-Morbid Rankin Score: No symptoms Modified Rankin: Severe disability     Balance Overall balance assessment: Needs assistance Sitting-balance support: Feet supported;Single extremity supported Sitting balance-Leahy Scale: Poor Sitting balance - Comments: worked on reaching with RUE to L and bringing chest fwd. Needed mod A to sit initially but after reaching and wt shifting was able to sit with close S for 3 mins Postural control: Left lateral lean;Posterior lean Standing balance support: Bilateral upper extremity supported Standing balance-Leahy Scale: Poor Standing balance comment: mod assist to maintain standing with left knee blocked and left lean                            Cognition Arousal/Alertness: Lethargic Behavior During Therapy: Flat affect Overall Cognitive Status: Impaired/Different from baseline Area of Impairment: Memory;Attention;Safety/judgement                 Orientation Level: Disoriented  to;Time;Situation Current Attention Level: Sustained Memory: Decreased short-term memory Following Commands: Follows one step commands with increased time;Follows one step commands inconsistently Safety/Judgement: Decreased awareness of safety;Decreased awareness of deficits Awareness: Intellectual Problem Solving: Requires verbal cues;Requires tactile cues;Slow processing;Decreased initiation;Difficulty sequencing General Comments: pt maintains poor awareness of deficits particularly with left inattention and neglect. Mod cues  throughout for attention and balance      Exercises      General Comments General comments (skin integrity, edema, etc.): pt unable to have BM on BSC. wife present through session. VSS on RA      Pertinent Vitals/Pain Pain Assessment: No/denies pain    Home Living                      Prior Function            PT Goals (current goals can now be found in the care plan section) Acute Rehab PT Goals Patient Stated Goal: Get stronger and go home. PT Goal Formulation: With patient/family Time For Goal Achievement: 05/26/21 Potential to Achieve Goals: Good Progress towards PT goals: Progressing toward goals    Frequency    Min 4X/week      PT Plan Current plan remains appropriate    Co-evaluation              AM-PAC PT "6 Clicks" Mobility   Outcome Measure  Help needed turning from your back to your side while in a flat bed without using bedrails?: A Little Help needed moving from lying on your back to sitting on the side of a flat bed without using bedrails?: A Lot Help needed moving to and from a bed to a chair (including a wheelchair)?: Total Help needed standing up from a chair using your arms (e.g., wheelchair or bedside chair)?: A Lot Help needed to walk in hospital room?: Total Help needed climbing 3-5 steps with a railing? : Total 6 Click Score: 10    End of Session Equipment Utilized During Treatment: Gait belt Activity Tolerance: Patient tolerated treatment well Patient left: in chair;with call bell/phone within reach;with chair alarm set;with family/visitor present Nurse Communication: Mobility status PT Visit Diagnosis: Unsteadiness on feet (R26.81);Muscle weakness (generalized) (M62.81);Difficulty in walking, not elsewhere classified (R26.2);Hemiplegia and hemiparesis Hemiplegia - Right/Left: Left Hemiplegia - dominant/non-dominant: Non-dominant Hemiplegia - caused by: Nontraumatic intracerebral hemorrhage     Time: 8182-9937 PT  Time Calculation (min) (ACUTE ONLY): 33 min  Charges:  $Gait Training: 8-22 mins $Therapeutic Activity: 8-22 mins                     Lyanne Co, PT  Acute Rehab Services  Pager [phone] Office [phone]    Lawana Chambers Rejoice Heatwole 05/18/2021, 1:17 PM

## 2021-05-18 NOTE — Progress Notes (Signed)
Inpatient Rehab Admissions Coordinator:   I have no beds available for this patient to admit to CIR today.  Will continue to follow for timing of potential admission pending bed availability.   Estill Dooms, PT, DPT Admissions Coordinator [phone] 05/18/21  9:53 AM

## 2021-05-18 NOTE — PMR Pre-admission (Addendum)
PMR Admission Coordinator Pre-Admission Assessment  Patient: Donald [name] is an 65 y.o., male MRN: [id number] DOB: 1956/07/13 Height:   Weight: 69.6 kg              Insurance Information HMO:     PPO: yes     PCP:      IPA:      80/20:      OTHER:  PRIMARY: GEHA/[employer]      Policy#: [id number] geha      Subscriber: pt's spouse CM Name: Leretha Dykes      Phone#: [phone]     Fax#: [phone] Pre-Cert#: [id number] [place] for CIR given by Leretha Dykes with updates due to fax listed above to confirm admission within 3 days of admit (H&P and therapy evaluations)      Employer:  Benefits:  Phone #: [phone]     Name: n/a Eff. Date: 07/11/2015     Deduct: $350 (met $170.16)      Out of Pocket Max: $6500 (met $185.16)      Life Max: n/a  CIR: $700/day for days 1-21      SNF: 85% Outpatient: 85%     Co-Ins: 15% Home Health: 85%      Co-Pay: 15% DME: 85%     Co-Pay: 15% Providers:  SECONDARY: n/a      Policy#:       Phone#:   Financial Counselor:       Phone#:   The "Data Collection Information Summary" for patients in Inpatient Rehabilitation Facilities with attached "Privacy Act [place] Records" was provided and verbally reviewed with: Patient and Family  Emergency Contact Information Contact Information     Name Relation Home Work Mobile   [place] Spouse [phone]  [phone]   Jaideep, [place] Daughter   [phone]      Current Medical History  Patient Admitting Diagnosis: R posterior frontal lobe intraparenchymal hemorrage  History of Present Illness: [name] is a 65 y.o. RH-male with history of cognitive deficits with behavioral changes--in process of being worked up for Alzheimer's dementia, OSA,OA who was admitted on 05/11/2021 with acute left hemiparesis and sensory deficits, right gaze preference and left facial droop.  UDS negative.  BP elevated at admission--194/104 and CT head done showing acute right posterior frontal lobe parenchymal  hemorrhage.  CTA head/neck was negative for LVO and showed narrowing of superior sagittal sinus with question of mass-effect versus intraluminal thrombus.  Follow-up MRI/MRA brain showed no significant change in bleed with surrounding edema and mild mass-effect and chronic hemosiderin right parietal lobe indicating site of prior hemorrhage.  He was started on Cleviprex for BP control and Dr. Erlinda Hong question CAA. Echocardiogram with ejection fraction of 65 to 70%, no regional wall abnormality. MRV brain done showing no evidence of intracranial venous thrombosis and mild vascular enhancement could be reactive.  Work-up ongoing and neurology recommends repeat CTA head/MRI once hematoma resolves on outpatient basis to rule out underlying source of bleed.  Therapy evaluation initiated and patient noted to be limited by left hemiplegia with left neglect and extensor tone.  Swallow evaluation done and patient started on dysphagia 1 thin liquids.  CIR recommended due to functional decline.  Complete NIHSS TOTAL: 11 Glasgow Coma Scale Score: 15  Past Medical History  Past Medical History:  Diagnosis Date   Arthritis     Family History  family history includes Hypertension in his mother.  Prior Rehab/Hospitalizations:  Has the patient had prior rehab or hospitalizations prior to admission? No  Has the patient  had major surgery during 100 days prior to admission? No  Current Medications   Current Facility-Administered Medications:    0.9 %  sodium chloride infusion, , Intravenous, PRN, Rosalin Hawking, MD, Stopped at 05/11/21 1324   acetaminophen (TYLENOL) tablet 650 mg, 650 mg, Oral, Q4H PRN, 650 mg at 05/17/21 2318 **OR** acetaminophen (TYLENOL) 160 MG/5ML solution 650 mg, 650 mg, Per Tube, Q4H PRN, 650 mg at 05/16/21 0233 **OR** acetaminophen (TYLENOL) suppository 650 mg, 650 mg, Rectal, Q4H PRN, Rikki Spearing, NP, 650 mg at 05/11/21 2106   amLODipine (NORVASC) tablet 10 mg, 10 mg, Oral, Daily, Rosalin Hawking, MD, 10 mg at 05/19/21 0941   Chlorhexidine Gluconate Cloth 2 % PADS 6 each, 6 each, Topical, Daily, Rosalin Hawking, MD, 6 each at 05/19/21 0944   heparin injection 5,000 Units, 5,000 Units, Subcutaneous, Q8H, Rosalin Hawking, MD, 5,000 Units at 05/19/21 0550   hydrochlorothiazide (MICROZIDE) capsule 12.5 mg, 12.5 mg, Oral, Daily, Pokhrel, Laxman, MD, 12.5 mg at 05/19/21 0941   labetalol (NORMODYNE) injection 5-10 mg, 5-10 mg, Intravenous, Q2H PRN, Rosalin Hawking, MD, 10 mg at 05/13/21 1636   MEDLINE mouth rinse, 15 mL, Mouth Rinse, BID, Rosalin Hawking, MD, 15 mL at 05/19/21 0944   pantoprazole (PROTONIX) EC tablet 40 mg, 40 mg, Oral, Daily, Rosalin Hawking, MD, 40 mg at 05/19/21 0942   polyethylene glycol (MIRALAX / GLYCOLAX) packet 17 g, 17 g, Oral, BID, Pokhrel, Laxman, MD, 17 g at 05/19/21 0941   senna-docusate (Senokot-S) tablet 1 tablet, 1 tablet, Oral, BID, Ebbie Latus, Stevi W, NP, 1 tablet at 05/19/21 0941   sodium chloride flush (NS) 0.9 % injection 3 mL, 3 mL, Intravenous, Once, Sherwood Gambler, MD  Patients Current Diet:  Diet Order             Diet - low sodium heart healthy           DIET DYS 2 Room service appropriate? 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  Self Care: Did the patient need help bathing, dressing, using  the toilet or eating?  Independent  Indoor Mobility: Did the patient need assistance with walking from room to room (with or without device)? Independent  Stairs: Did the patient need assistance with internal or external stairs (with or without device)? Independent  Functional Cognition: Did the patient need help planning regular tasks such as shopping or remembering to take medications? Needed some help  Home Assistive Devices / [place] Devices/Equipment: None Home Equipment: Shower seat - built in,Grab bars - toilet,Grab bars - tub/shower  Prior Device Use: Indicate devices/aids used by the patient prior to current illness, exacerbation or injury? None of the above  Current Functional Level Cognition  Arousal/Alertness: Awake/alert Overall Cognitive Status: Impaired/Different from baseline Current Attention Level: Sustained Orientation Level: Oriented X4 Following Commands:  Follows one step commands with increased time,Follows one step commands inconsistently Safety/Judgement: Decreased awareness of safety,Decreased awareness of deficits General Comments: pt maintains poor awareness of deficits particularly with left inattention and neglect. Mod cues throughout for attention and balance Attention: Sustained Sustained Attention: Impaired Sustained Attention Impairment: Verbal basic,Functional basic Memory:  (hx of impairments- will assess further) Awareness: Impaired Awareness Impairment: Intellectual impairment,Emergent impairment,Anticipatory impairment Problem Solving: Impaired Problem Solving Impairment: Verbal basic,Functional basic Safety/Judgment: Impaired    Extremity Assessment (includes Sensation/Coordination)  Upper Extremity Assessment: LUE deficits/detail RUE Deficits / Details: AROM, WFLs; strength 5/5 LUE Deficits / Details: No grasp strength or hand AROM. Flexion syngery pattern at bicep. No active movement at scapula and noting increased  tone. LUE Sensation: decreased light touch,decreased proprioception LUE Coordination: decreased fine motor,decreased gross motor  Lower Extremity Assessment: Defer to PT evaluation LLE Deficits / Details: no active movement    ADLs  Overall ADL's : Needs assistance/impaired Eating/Feeding: NPO Grooming: Moderate assistance,Sitting,Cueing for sequencing Grooming Details (indicate cue type and reason): Pt requiring Min-Mod A for sitting balance in chair while performing oral care. Pt requiring mod A to incorporate LUE into task. Max cues throughout for sequencing and visual scanning to L Upper Body Bathing: Moderate assistance Lower Body Bathing: Maximal assistance,Sitting/lateral leans,Sit to/from stand,Bed level Upper Body Dressing : Moderate assistance,Sitting,Standing,Bed level Lower Body Dressing: Maximal assistance,Sitting/lateral leans,Sit to/from stand,Bed level,Cueing for safety Toilet Transfer: Maximal assistance,+2 for physical assistance,+2 for safety/equipment,BSC Toilet Transfer Details (indicate cue type and reason): simulated for sit to stand, but requires near [place] to move feet Toileting- Clothing Manipulation and Hygiene: Maximal assistance,+2 for physical assistance,Sitting/lateral lean,Sit to/from stand Functional mobility during ADLs: Maximal assistance,+2 for physical assistance,+2 for safety/equipment,Rolling walker,Cueing for safety (multiple sit to stands with PT; L knee blocked) General ADL Comments: Focused session on sitting balance, visual scanning, cognition, and incorporation of LUE into task.    Mobility  Overal bed mobility: Needs Assistance Bed Mobility: Rolling,Sidelying to Sit Rolling: Min assist Sidelying to sit: Mod assist Supine to sit: Mod assist,HOB elevated Sit to supine: Mod assist,+2 for physical assistance General bed mobility comments: pt needed tactile and vc's to initiate and keep moving through transitions. Assist to hook LLE with R foot.  Mod A for LE's off EOB. Mod HHA for SL to sit    Transfers  Overall transfer level: Needs assistance Equipment used: 2 person hand held assist Transfers: Sit to/from [employer] Sit to Stand: +2 physical assistance,Max assist Stand pivot transfers: +2 physical assistance,Max assist General transfer comment: pt stood and pivoted bed to [facility] then BSC to recliner. First stand pt needed max A +2 for power up and did not gain full balance in standing, needed max facilitation at hips to shift to R to sit. With standing from [facility] pt needed mod A +2  and was able to come to full upright posture and maintain >1 min but needed max A to step R foot toward chair to sit. Pt needed L knee blocked for both transfers    Ambulation / Gait / Stairs / Wheelchair Mobility  Ambulation/Gait General Gait Details: pregait wt shifting in standing    Posture / Balance Dynamic Sitting Balance Sitting balance - Comments: worked on reaching with RUE to L and bringing chest fwd. Needed mod A to sit initially but after reaching and wt shifting was able to sit with close S for 3 mins Balance Overall balance assessment: Needs assistance Sitting-balance support: Feet supported,Single extremity supported Sitting balance-Leahy Scale:  Poor Sitting balance - Comments: worked on reaching with RUE to L and bringing chest fwd. Needed mod A to sit initially but after reaching and wt shifting was able to sit with close S for 3 mins Postural control: Left lateral lean,Posterior lean Standing balance support: Bilateral upper extremity supported Standing balance-Leahy Scale: Poor Standing balance comment: mod assist to maintain standing with left knee blocked and left lean    Special needs/care consideration n/a     Previous Home Environment (from acute therapy documentation) Living Arrangements: Spouse/significant other,Other (Comment) (parents)  Lives With: Spouse Available Help at Discharge: Family,Available 24  hours/day Type of Home: House Home Layout: One level Home Access: Stairs to enter Entrance Stairs-Rails: Left Entrance Stairs-Number of Steps: 4 Bathroom Shower/Tub: Multimedia programmer: Standard Home Care Services: No Additional Comments: has access to RW from pt's MIL  Discharge Living Setting Plans for Discharge Living Setting: Patient's home,Lives with (comment) (spouse, [name]) Type of Home at Discharge: House Discharge Home Layout: One level Discharge Home Access: Stairs to enter Entrance Stairs-Rails: Left Entrance Stairs-Number of Steps: 4 Discharge Bathroom Shower/Tub: Walk-in shower Discharge Bathroom Toilet: Standard Discharge Bathroom Accessibility: Yes How Accessible: Accessible via walker Does the patient have any problems obtaining your medications?: No  Social/Family/Support Systems Patient Roles: Spouse Anticipated Caregiver: spouse, Keanon Bevins Anticipated Caregiver's Contact Information: [phone] Ability/Limitations of Caregiver: supervision to min assist (ADLs) Caregiver Availability: 24/7 Discharge Plan Discussed with Primary Caregiver: Yes Is Caregiver In Agreement with Plan?: Yes Does Caregiver/Family have Issues with Lodging/Transportation while Pt is in Rehab?: No   Goals Patient/Family Goal for Rehab: PT/OT min assist, SLP supervision to min assist Expected length of stay: 27-30 days Additional Information: outpatient workup for cognitive decline in progress prior to admit Pt/Family Agrees to Admission and willing to participate: Yes Program Orientation Provided & Reviewed with Pt/Caregiver Including Roles  & Responsibilities: Yes  Barriers to Discharge: Insurance for SNF coverage   Decrease burden of Care through IP rehab admission: na/  Possible need for SNF placement upon discharge: Not anticipated   Patient Condition: This patient's medical and functional status has changed since the consult dated: 05/12/2021 in which  the Rehabilitation Physician determined and documented that the patient's condition is appropriate for intensive rehabilitative care in an inpatient rehabilitation facility. See "History of Present Illness" (above) for medical update. Functional changes are: pt mod/max +2. Patient's medical and functional status update has been discussed with the Rehabilitation physician and patient remains appropriate for inpatient rehabilitation. Will admit to inpatient rehab today after admission was delayed 6/8.  Preadmission Screen Completed By:  Michel Santee, PT, DPT 05/19/2021 10:33 AM ______________________________________________________________________   Discussed status with Dr. Letta Pate on 05/19/21 at 10:33 AM  and received approval for admission today.  Admission Coordinator:  Michel Santee, PT, DPT time 10:34 AM Sudie Grumbling 05/19/21    D/w Admission Coordinator- pt was held yesterday- will admit today!

## 2021-05-18 NOTE — Progress Notes (Signed)
PROGRESS NOTE  [name] OMV:[id number] DOB: Feb 24, 1956 DOA: 05/11/2021 PCP: [name], MD   LOS: 7 days   Brief narrative: Donald [name] is a 65 y.o. male with a medical history significant for dementia with behavioral disturbance and reported OSA not on CPAP per wife who presented to the ED via EMS for evaluation of acute onset of left hemiparesis, right gaze, and left mouth droop. Donald [name] had woken up in his normal state of health this morning. He and his wife had intercourse this morning and when she got up to get ready for the day, she noticed that Donald [name] was acting "off". She asked him to get out of bed and he stated that he was unable to move his left side and she noticed that he was looking towards the right so she activated EMS. His initial manual blood pressure at 08:15 was 194/104 and on arrival to the hospital around 09:00 his blood pressure was 122/74. On arrival, Donald [name] was noted to have persistent left hemiparesis, right gaze, neck pain, left mouth droop, and decreased sensation on the left and was immediately taken to CT for stroke evaluation. As baseline Donald [name] is able to care for himself at home but has some short term memory deficits and had a scheduled lumbar puncture for outpatient evaluation of possible Alzheimer's Dementia at [facility]. At this time, patient is stable and is awaiting for CIR placement..    Assessment/Plan:  Active Problems:   ICH (intracerebral hemorrhage) (HCC)   Memory deficits   OSA (obstructive sleep apnea)   Generalized OA   Hypokalemia   Leukocytosis  Acute parenchymal hemorrhage in the posterior right frontal lobe with right-sided hemiplegia. Patient was initially admitted to the ICU for closer monitoring.  Focus on blood pressure control with systolic blood pressure goal of 120-140.  Initially required Cleviprex and labetalol in the ICU.  Hemoglobin A1c of 5.8. Lipid profile noted with total cholesterol 232 and LDL  of 140.  MRI of the brain showed posterior right frontal lobe intra-axial hemorrhage with layering and trace subarachnoid hemorrhage.  2D echocardiogram showed preserved LV function at 65 to 70%.  Patient has been seen by physical therapy recommended CIR. neurology recommending repeat CTA head and MRI after the hematoma resolves and recommends outpatient follow-up with [facility] neurology.  Speech therapy has recommended dysphagia 2 diet.   Statin goal for LDL less than 70.  Recommend low-dose statin 20mg  on discharge.  No interval changes  Obstructive sleep apnea not on CPAP Will monitor closely.  Hypertensive Urgency  Initial blood pressure was 194/ 104.  Patient received IV drip with adequate blood pressure control.  Currently on amlodipine and labetalol.  Closely monitor.  Goal less than 160.  Initiated HCTZ with improved blood pressure.  Latest blood pressure of 109/69  Hyperlipidemia.  On statins.  Continue Lipitor 20 mg daily.  History of cognitive dysfunction and dementia.  Follows up with [facility] neurology as outpatient.  He was supposed to have a lumbar puncture as outpatient.  Spoke with the patient's spouse at bedside.  Dysphagia.  On dysphagia 2 diet.  Advance as tolerated as per speech therapy.  Leukocytosis likely reactive.  Normalized at this time.  Hypokalemia improved after replacement.  Latest potassium of 3.8.  Monitor BMP.  Constipation.  Bowel regimen. DVT prophylaxis: heparin injection 5,000 Units Start: 05/13/21 1400 SCD's Start: 05/11/21 0941   Code Status: Full code  Family Communication:  I spoke with the patient's wife on 05/17/21 .  Status is: Inpatient  Remains inpatient appropriate because: IV treatments appropriate due to intensity of illness or inability to take PO, Inpatient level of care appropriate due to severity of illness and Need for rehabilitation   Dispo: The patient is from: Home              Anticipated d/c is to: CIR              Patient  currently is  medically stable to d/c.   Difficult to place patient No  Consultants:  Neurology  Procedures:  None  Anti-infectives:  . None  Anti-infectives (From admission, onward)   None     Subjective: Today, patient was seen and examined at bedside.  Denies any headache nausea vomiting abdominal pain.    Objective: Vitals:   05/18/21 0400 05/18/21 0751  BP: 126/82 109/69  Pulse: 98 91  Resp: 18   Temp: 98.6 F (37 C) 98.1 F (36.7 C)  SpO2: 97% 100%    Intake/Output Summary (Last 24 hours) at 05/18/2021 1300 Last data filed at 05/17/2021 1849 Gross per 24 hour  Intake 236 ml  Output 550 ml  Net -314 ml   Filed Weights   05/11/21 0800 05/12/21 2230  Weight: 70.4 kg 69.6 kg   Body mass index is 23.33 kg/m.   Physical Exam: General:  Average built, not in obvious distress, alert awake and communicative. HENT:   No scleral pallor or icterus noted. Oral mucosa is moist.  Chest:  Clear breath sounds.  Diminished breath sounds bilaterally. No crackles or wheezes.  CVS: S1 &S2 heard. No murmur.  Regular rate and rhythm. Abdomen: Soft, nontender, nondistended.  Bowel sounds are heard.   Extremities: No cyanosis, clubbing or edema.  Peripheral pulses are palpable. Psych: Alert, awake and oriented, normal mood CNS: Right-sided hemiplegia Skin: Warm and dry.  No rashes noted.   Data Review: I have personally reviewed the following laboratory data and studies,  CBC: Recent Labs  Lab 05/12/21 0341 05/13/21 0338 05/14/21 0153 05/18/21 0446  WBC 12.4* 8.1 7.8 6.6  HGB 15.6 15.6 15.5 17.0  HCT 46.2 47.4 47.0 50.1  MCV 89.4 90.1 89.7 88.8  PLT 232 233 237 257   Basic Metabolic Panel: Recent Labs  Lab 05/12/21 0341 05/13/21 0338 05/14/21 0153 05/18/21 0446  NA 138 138 138 134*  K 3.3* 3.7 3.8 4.6  CL 100 102 105 95*  CO2 25 25 24 28   GLUCOSE 139* 116* 108* 116*  BUN 7* 9 10 22   CREATININE 0.98 1.12 1.03 1.23  CALCIUM 9.7 9.5 9.5 10.1  MG  --    --  2.1 2.7*  PHOS  --   --  2.9  --    Liver Function Tests: Recent Labs  Lab 05/12/21 0341  AST 24  ALT 19  ALKPHOS 108  BILITOT 0.8  PROT 7.5  ALBUMIN 4.3   No results for input(s): LIPASE, AMYLASE in the last 168 hours. No results for input(s): AMMONIA in the last 168 hours. Cardiac Enzymes: No results for input(s): CKTOTAL, CKMB, CKMBINDEX, TROPONINI in the last 168 hours. BNP (last 3 results) No results for input(s): BNP in the last 8760 hours.  ProBNP (last 3 results) No results for input(s): PROBNP in the last 8760 hours.  CBG: No results for input(s): GLUCAP in the last 168 hours. Recent Results (from the past 240 hour(s))  Resp Panel by RT-PCR (Flu A&B, Covid) Nasopharyngeal Swab     Status: None  Collection Time: 05/11/21  9:56 AM   Specimen: Nasopharyngeal Swab; Nasopharyngeal(NP) swabs in vial transport medium  Result Value Ref Range Status   SARS Coronavirus 2 by RT PCR NEGATIVE NEGATIVE Final    Comment: (NOTE) SARS-CoV-2 target nucleic acids are NOT DETECTED.  The SARS-CoV-2 RNA is generally detectable in upper respiratory specimens during the acute phase of infection. The lowest concentration of SARS-CoV-2 viral copies this assay can detect is 138 copies/mL. A negative result does not preclude SARS-Cov-2 infection and should not be used as the sole basis for treatment or other patient management decisions. A negative result may occur with  improper specimen collection/handling, submission of specimen other than nasopharyngeal swab, presence of viral mutation(s) within the areas targeted by this assay, and inadequate number of viral copies(<138 copies/mL). A negative result must be combined with clinical observations, patient history, and epidemiological information. The expected result is Negative.  Fact Sheet for Patients:  BloggerCourse.com  Fact Sheet for Healthcare Providers:   SeriousBroker.it  This test is no t yet approved or cleared by the [place] FDA and  has been authorized for detection and/or diagnosis of SARS-CoV-2 by FDA under an Emergency Use Authorization (EUA). This EUA will remain  in effect (meaning this test can be used) for the duration of the COVID-19 declaration under Section 564(b)(1) of the Act, 21 U.S.C.section 360bbb-3(b)(1), unless the authorization is terminated  or revoked sooner.       Influenza A by PCR NEGATIVE NEGATIVE Final   Influenza B by PCR NEGATIVE NEGATIVE Final    Comment: (NOTE) The Xpert Xpress SARS-CoV-2/FLU/RSV plus assay is intended as an aid in the diagnosis of influenza from Nasopharyngeal swab specimens and should not be used as a sole basis for treatment. Nasal washings and aspirates are unacceptable for Xpert Xpress SARS-CoV-2/FLU/RSV testing.  Fact Sheet for Patients: BloggerCourse.com  Fact Sheet for Healthcare Providers: SeriousBroker.it  This test is not yet approved or cleared by the [place] FDA and has been authorized for detection and/or diagnosis of SARS-CoV-2 by FDA under an Emergency Use Authorization (EUA). This EUA will remain in effect (meaning this test can be used) for the duration of the COVID-19 declaration under Section 564(b)(1) of the Act, 21 U.S.C. section 360bbb-3(b)(1), unless the authorization is terminated or revoked.  Performed at [facility], [address] [place], [place] [address]   MRSA PCR Screening     Status: None   Collection Time: 05/11/21 11:05 AM   Specimen: Nasopharyngeal  Result Value Ref Range Status   MRSA by PCR NEGATIVE NEGATIVE Final    Comment:        The GeneXpert MRSA Assay (FDA approved for NASAL specimens only), is one component of a comprehensive MRSA colonization surveillance program. It is not intended to diagnose MRSA infection nor to guide  or monitor treatment for MRSA infections. Performed at [facility], [address], [place], [place] [address]      Studies: No results found.  Joycelyn Das, MD  [facility] 05/18/2021  If 7PM-7AM, please contact night-coverage

## 2021-05-19 MED ORDER — ATORVASTATIN CALCIUM 20 MG PO TABS: 20.0000 mg | ORAL_TABLET | Freq: Every day | ORAL | Status: DC

## 2021-05-19 MED ORDER — PANTOPRAZOLE SODIUM 40 MG PO TBEC: 40.0000 mg | DELAYED_RELEASE_TABLET | Freq: Every day | ORAL | Status: DC

## 2021-05-19 MED ORDER — AMLODIPINE BESYLATE 10 MG PO TABS: 10.0000 mg | ORAL_TABLET | Freq: Every day | ORAL | Status: DC

## 2021-05-19 MED ORDER — SENNOSIDES-DOCUSATE SODIUM 8.6-50 MG PO TABS: 1.0000 | ORAL_TABLET | Freq: Two times a day (BID) | ORAL | Status: DC

## 2021-05-19 MED ORDER — POLYETHYLENE GLYCOL 3350 17 G PO PACK: 17.0000 g | PACK | Freq: Two times a day (BID) | ORAL | 0 refills | Status: DC

## 2021-05-19 MED ORDER — HYDROCHLOROTHIAZIDE 12.5 MG PO CAPS: 12.5000 mg | ORAL_CAPSULE | Freq: Every day | ORAL | Status: DC

## 2021-05-19 NOTE — Progress Notes (Signed)
Physical Therapy Treatment Patient Details Name: Donald [name] MRN: [id number] DOB: [birth year] Today's Date: 05/19/2021    History of Present Illness 65 y.o. M who presents with right gaze and left hemiplegia. 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 Significant PMH: memory deficits, OSA.    PT Comments    Patient received in bed, alert, pleasant, agreeable to PT session. Wife present but left room for session. Patient has left neglect and left preferred gaze/ head turn. Requires heavy mod +2 assist to go from supine to sitting up on side of bed. Heavy mod/max assist to maintain sitting balance. Posterior/left leaning. Patient unable to maintain balance without support this session. Used stedy to perform sit to stand from bed with mod +2 assist. Patient requires multimodal cues for balance and midline. Unable to effectively self correct. Patient will continue to benefit from skilled PT while here to improve functional independence and safety with mobility.       Follow Up Recommendations  CIR     Equipment Recommendations  3in1 (PT);Wheelchair (measurements PT);Wheelchair cushion (measurements PT);Hospital bed    Recommendations for Other Services Rehab consult     Precautions / Restrictions Precautions Precautions: Fall Precaution Comments: L hemiparesis, L neglect Restrictions Weight Bearing Restrictions: No    Mobility  Bed Mobility Overal bed mobility: Needs Assistance Bed Mobility: Supine to Sit     Supine to sit: Mod assist;+2 for physical assistance;Max assist;HOB elevated     General bed mobility comments: assist to progress L leg off the bed, and scoot hips to EOB. Heavy posterior/left leaning with sitting    Transfers Overall transfer level: Needs assistance Equipment used: Ambulation equipment used Transfers: Sit to/from Stand Sit to Stand: +2 physical assistance;Max assist Stand pivot transfers: +2 safety/equipment       General  transfer comment: Patient leans heavily to the left requiring mod to max assist to remain upright. Pushes with right UE  Ambulation/Gait             General Gait Details: unable   Stairs             Wheelchair Mobility    Modified Rankin (Stroke Patients Only) Modified Rankin (Stroke Patients Only) Pre-Morbid Rankin Score: No symptoms Modified Rankin: Severe disability     Balance Overall balance assessment: Needs assistance Sitting-balance support: Feet supported;Single extremity supported Sitting balance-Leahy Scale: Zero Sitting balance - Comments: Patient requiring heavy assist to maintain sitting balance this session. When not assisted he leans posteriorly and is unable to self correct despite cues. Postural control: Left lateral lean;Posterior lean Standing balance support: Bilateral upper extremity supported Standing balance-Leahy Scale: Zero Standing balance comment: Requiring heavy mod assist to assist with lean to left and left knee braced on stedy.                            Cognition Arousal/Alertness: Awake/alert Behavior During Therapy: WFL for tasks assessed/performed   Area of Impairment: Awareness;Problem solving;Safety/judgement                 Orientation Level: Disoriented to;Situation Current Attention Level: Sustained Memory: Decreased short-term memory Following Commands: Follows one step commands with increased time Safety/Judgement: Decreased awareness of safety;Decreased awareness of deficits Awareness: Intellectual Problem Solving: Requires verbal cues;Requires tactile cues;Slow processing;Decreased initiation;Difficulty sequencing General Comments: pt maintains poor awareness of deficits particularly with left inattention and neglect. Mod cues throughout for attention and balance  Exercises      General Comments        Pertinent Vitals/Pain Pain Assessment: No/denies pain Faces Pain Scale: No hurt Pain  Intervention(s): Monitored during session    Home Living                      Prior Function            PT Goals (current goals can now be found in the care plan section) Acute Rehab PT Goals Patient Stated Goal: none stated PT Goal Formulation: With patient/family Time For Goal Achievement: 05/26/21 Progress towards PT goals: Progressing toward goals    Frequency    Min 4X/week      PT Plan Current plan remains appropriate    Co-evaluation PT/OT/SLP Co-Evaluation/Treatment: Yes Reason for Co-Treatment: For patient/therapist safety;To address functional/ADL transfers PT goals addressed during session: Mobility/safety with mobility;Balance;Proper use of DME OT goals addressed during session: ADL's and self-care      AM-PAC PT "6 Clicks" Mobility   Outcome Measure  Help needed turning from your back to your side while in a flat bed without using bedrails?: A Lot Help needed moving from lying on your back to sitting on the side of a flat bed without using bedrails?: Total Help needed moving to and from a bed to a chair (including a wheelchair)?: Total Help needed standing up from a chair using your arms (e.g., wheelchair or bedside chair)?: Total Help needed to walk in hospital room?: Total Help needed climbing 3-5 steps with a railing? : Total 6 Click Score: 7    End of Session Equipment Utilized During Treatment: Gait belt Activity Tolerance: Patient tolerated treatment well Patient left: in chair;with call bell/phone within reach;with chair alarm set;with family/visitor present;Other (comment) (lift pad in chair) Nurse Communication: Mobility status;Need for lift equipment PT Visit Diagnosis: Muscle weakness (generalized) (M62.81);Hemiplegia and hemiparesis;Other abnormalities of gait and mobility (R26.89) Hemiplegia - Right/Left: Left Hemiplegia - dominant/non-dominant: Non-dominant Hemiplegia - caused by: Nontraumatic intracerebral hemorrhage      Time: 1130-1159 PT Time Calculation (min) (ACUTE ONLY): 29 min  Charges:  $Therapeutic Activity: 8-22 mins                     [employer], PT, GCS 05/19/21,12:43 PM

## 2021-05-19 NOTE — H&P (Addendum)
Physical Medicine and Rehabilitation Admission H&P        Chief Complaint  Patient presents with   ICH with functional deficits      HPI: Donald [name] right handed male with history of declining memory and  personality changes which were present after fall in 2020 and in process of being referred to do for dementia work-up.  He has otherwise been in good health.  He was admitted on 05/11/21 with acute left hemiparesis, sensory deficits, right gaze preference and left facial droop.  UDS was negative.  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  CTA head/neck was negative for LVO and showed narrowing of superior sagittal sinus with question of mass-effect versus intraluminal thrombus.  Follow-up MRI/MRA brain showed no significant change in bleed with surrounding edema and mild mass-effect and chronic hemosiderin in right parietal lobe indicating site of prior hemorrhage.  He was started on Cleviprex for BP control and Dr. Roda Shutters question CAA.   Echocardiogram showed EF of 65 to 70% with no wall abnormality.  MRV brain done showing no evidence of intracranial venous thrombosis and mild vascular enhancement felt to be reactive in nature.  Neurology recommends repeat CTA head/MRI brain on outpatient basis hematoma resolves to rule out underlying source of bleed.  Patient continues to be limited by left facial droop with right gaze preference, left hemiplegia as well as cognitive deficits.  He was placed on modified diet of dysphagia to thin liquids.  Therapy ongoing and patient able to sit edge of bed for 8 minutes is working on standing with left knee supported.  CIR recommended due to functional decline.     Review of Systems  Constitutional: Positive for weight loss (in the past year). Negative for chills and fever.  HENT: Negative for hearing loss and sinus pain.   Eyes: Negative for blurred vision and double vision.   Respiratory: Negative for cough and shortness of breath.   Cardiovascular: Negative for chest pain and palpitations.  Gastrointestinal: Positive for abdominal pain and constipation. Negative for heartburn and nausea.  Genitourinary: Negative for dysuria and urgency.  Musculoskeletal: Negative for myalgias.  Skin: Negative for rash.  Neurological: Positive for speech change and weakness. Negative for dizziness.  Psychiatric/Behavioral: Positive for memory loss.            Past Medical History:  Diagnosis Date   Arthritis             Past Surgical History:  Procedure Laterality Date   APPENDECTOMY               Family History  Problem Relation Age of Onset   Hypertension Mother     Colon polyps Neg Hx     Colon cancer Neg Hx     Esophageal cancer Neg Hx     Rectal cancer Neg Hx     Stomach cancer Neg Hx        Social History:  Married. From [place]--has been in [place] since 1986. 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 Wife reports that he has never smoked. He has never used smokeless tobacco. He reports that he does not drink alcohol--occasional glass of wine. He does not use drugs.           Allergies  Allergen Reactions   Sulfa Antibiotics Other (See Comments)      Nausea and  eye swelling              Medications Prior to Admission  Medication Sig Dispense Refill   Multiple Vitamins-Minerals (MENS 50+ MULTI VITAMIN/MIN PO) Take 1 Dose by mouth in the morning and at bedtime.       mefloquine (LARIAM) 250 MG tablet Take 1 tablet (250 mg total) by mouth every 7 (seven) days. (Patient not taking: No sig reported) 16 tablet 0      Drug Regimen Review  Drug regimen was reviewed and remains appropriate with no significant issues identified   Home: Home Living Family/patient expects to be discharged to:: Private residence Living Arrangements: Spouse/significant other,Other (Comment)  (parents) Available Help at Discharge: Family,Available 24 hours/day Type of Home: House Home Access: Stairs to enter [employer] of Steps: 4 Entrance Stairs-Rails: Left Home Layout: One level Bathroom Shower/Tub: Health visitor: Standard Home Equipment: Shower seat - built in,Grab bars - toilet,Grab bars - tub/shower Additional Comments: has access to RW from pt's MIL  Lives With: Spouse   Functional History: Prior Function Level of Independence: Independent Comments: part-time Education officer, 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   Functional Status:  Mobility: Bed Mobility Overal bed mobility: Needs Assistance Bed Mobility: Supine to Sit Rolling: Min assist Sidelying to sit: Mod assist Supine to sit: Mod assist,+2 for physical assistance,Max assist,HOB elevated Sit to supine: Mod assist,+2 for physical assistance General bed mobility comments: assist to progress L leg off the bed, and scoot hips to EOB. Heavy posterior/left leaning with sitting Transfers Overall transfer level: Needs assistance Equipment used: Ambulation equipment used Transfer via Lift Equipment: Stedy Transfers: Sit to/from Stand Sit to Stand: +2 physical assistance,Max assist Stand pivot transfers: +2 safety/equipment General transfer comment: Patient leans heavily to the left requiring mod to max assist to remain upright. Pushes with right UE Ambulation/Gait General Gait Details: unable   ADL: ADL Overall ADL's : Needs assistance/impaired Eating/Feeding: NPO Grooming: Sitting,Cueing for sequencing,Minimal assistance Grooming Details (indicate cue type and reason): Pt requiring Min-Mod A for sitting balance in chair while performing oral care. Pt requiring mod A to incorporate LUE into task. Max cues throughout for sequencing and visual scanning to L Upper Body Bathing: Moderate assistance Lower Body Bathing: Maximal  assistance,Sitting/lateral leans,Sit to/from stand,Bed level Upper Body Dressing : Moderate assistance,Sitting,Standing,Bed level Lower Body Dressing: Maximal assistance,Sitting/lateral leans,Sit to/from stand,Bed level,Cueing for safety Toilet Transfer: Maximal assistance,+2 for physical assistance,+2 for safety/equipment,BSC Toilet Transfer Details (indicate cue type and reason): simulated for sit to stand, but requires near totalA to move feet Toileting- Clothing Manipulation and Hygiene: Maximal assistance,+2 for physical assistance,Sitting/lateral lean,Sit to/from stand Functional mobility during ADLs: Maximal assistance,+2 for physical assistance,+2 for safety/equipment,Rolling walker,Cueing for safety (multiple sit to stands with PT; L knee blocked) General ADL Comments: Focused session on sitting balance, visual scanning, cognition, and incorporation of LUE into task.   Cognition: Cognition Overall Cognitive Status: Impaired/Different from baseline Arousal/Alertness: Awake/alert Orientation Level: Oriented X4 Attention: Sustained Sustained Attention: Impaired Sustained Attention Impairment: Verbal basic,Functional basic Memory:  (hx of impairments- will assess further) Awareness: Impaired Awareness Impairment: Intellectual impairment,Emergent impairment,Anticipatory impairment Problem Solving: Impaired Problem Solving Impairment: Verbal basic,Functional basic Safety/Judgment: Impaired Cognition Arousal/Alertness: Awake/alert Behavior During Therapy: WFL for tasks assessed/performed Overall Cognitive Status: Impaired/Different from baseline Area of Impairment: Awareness,Problem solving,Safety/judgement Orientation Level: Disoriented to,Situation Current Attention Level: Sustained Memory: Decreased short-term memory Following Commands: Follows one step commands with increased time Safety/Judgement: Decreased awareness of safety,Decreased awareness  of deficits Awareness:  Intellectual Problem Solving: Requires verbal cues,Requires tactile cues,Slow processing,Decreased initiation,Difficulty sequencing General Comments: pt maintains poor awareness of deficits particularly with left inattention and neglect. Mod cues throughout for attention and balance     Blood pressure 121/77, pulse 84, temperature 98 F (36.7 C), temperature source Oral, resp. rate 18, weight 69.6 kg, SpO2 100 %. Physical Exam Constitutional: No distress, asleep . Vital signs reviewed. HEENT: EOMI, oral membranes moist Neck: supple Cardiovascular: RRR without murmur. No JVD    Respiratory/Chest: CTA Bilaterally without wheezes or rales. Normal effort    GI/Abdomen: BS +, non-tender, non-distended Ext: no clubbing, cyanosis, or edema Psych: somnolent Neurological:     Mental Status: He is alert.     Comments: Left facial droop with minimal dysarthria.  Sedated. Aroused to verbal stimuli. Dense left hemiparesis 0/5 LUE and LLE. Early tone LLE. DTR's 3+ LLE 2+ LUE. Moved right side spontaneously. Withdraws to pain on right side, minimal withdrawal on left.         Lab Results Last 48 Hours        Results for orders placed or performed during the hospital encounter of 05/11/21 (from the past 48 hour(s))  Basic metabolic panel     Status: Abnormal    Collection Time: 05/18/21  4:46 AM  Result Value Ref Range    Sodium 134 (L) 135 - 145 mmol/L    Potassium 4.6 3.5 - 5.1 mmol/L    Chloride 95 (L) 98 - 111 mmol/L    CO2 28 22 - 32 mmol/L    Glucose, Bld 116 (H) 70 - 99 mg/dL      Comment: Glucose reference range applies only to samples taken after fasting for at least 8 hours.    BUN 22 8 - 23 mg/dL    Creatinine, Ser 8.111.23 0.61 - 1.24 mg/dL    Calcium 91.410.1 8.9 - 78.210.3 mg/dL    GFR, Estimated >95>60 >62>60 mL/min      Comment: (NOTE) Calculated using the CKD-EPI Creatinine Equation (2021)      Anion gap 11 5 - 15      Comment: Performed at [facility], [address]  [place], [place] [address]  Magnesium     Status: Abnormal    Collection Time: 05/18/21  4:46 AM  Result Value Ref Range    Magnesium 2.7 (H) 1.7 - 2.4 mg/dL      Comment: Performed at [facility], [address] [place], [place] [address]  CBC     Status: None    Collection Time: 05/18/21  4:46 AM  Result Value Ref Range    WBC 6.6 4.0 - 10.5 K/uL    RBC 5.64 4.22 - 5.81 MIL/uL    Hemoglobin 17.0 13.0 - 17.0 g/dL    HCT 96.250.1 95.239.0 - 84.152.0 %    MCV 88.8 80.0 - 100.0 fL    MCH 30.1 26.0 - 34.0 pg    MCHC 33.9 30.0 - 36.0 g/dL    RDW 32.412.9 40.111.5 - 02.715.5 %    Platelets 257 150 - 400 K/uL    nRBC 0.0 0.0 - 0.2 %      Comment: Performed at [facility], [address] [place], [place] [address]      Imaging Results (Last 48 hours)  No results found.           Medical Problem List and Plan: 1.  Decline in Mobilit yand ADL secondary  to RIght frontal IPH             -patient may  shower, PRAFO for LLE             -ELOS/Goals: 20-22d, min assist PT,OT and supervision to min assist with SLP   2.  Antithrombotics: -DVT/anticoagulation:  Pharmaceutical: Heparin             -antiplatelet therapy: N/A due to bleed  3. Pain Management: Tylenol prn 4. Mood: LCSW to follow for evaluation and support.              -antipsychotic agents: N/a 5. Neuropsych: This patient is not capable of making decisions on his own behalf. 6. Skin/Wound Care: Routine pressure relief measures.  7. Fluids/Electrolytes/Nutrition: Monitor I/O. Check lytes in am.  8. OSA: Was not compliant with CPAP 9. Hyponatremia: Will recheck lytes in am.  10. HTN: Monitor BP tid--continue Microzide daily.  11. Prediabetes: Hgb A1c-5.8. Blood sugars have been reasonable in 100-120 range. Continue Carb Modified restrictions. 12. Dyslipidemia: Statin held due to bleed--resume at discharge.  13. Constipation: had large bm 6/8          Jacquelynn Cree, PA-C 05/19/2021  -

## 2021-05-19 NOTE — Progress Notes (Signed)
Physical Medicine and Rehabilitation Consult     Reason for Consult: ICH with functional deficits. Referring Physician: Dr. Roda Shutters     HPI: Donald [name] is a 65 y.o. RH-male with history of cognitive deficits with behavioral changes--in process of being worked up for Alzheimer's dementia, OSA,OA who was admitted on 05/11/2021 with acute left hemiparesis and sensory deficits, right gaze preference and left facial droop.  UDS negative.  BP elevated at admission--194/104 and CT head done showing 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  CTA head/neck was negative for LVO and showed narrowing of superior sagittal sinus with question of mass-effect versus intraluminal thrombus.  Follow-up MRI/MRA brain showed no significant change in bleed with surrounding edema and mild mass-effect and chronic hemosiderin right parietal lobe indicating site of prior hemorrhage.  He was started on Cleviprex for BP control and Dr. Roda Shutters question CAA.Marland Kitchen    Echocardiogram with ejection fraction of 65 to 70%, no regional wall abnormality. MRV brain done showing no evidence of intracranial venous thrombosis and mild vascular enhancement could be reactive.  Work-up ongoing and neurology recommends repeat CTA head/MRI once hematoma resolves on outpatient basis to rule out underlying source of bleed.  Therapy evaluation initiated and patient noted to be limited by left hemiplegia with left neglect and extensor tone.  Swallow evaluation done and patient started on dysphagia 1 thin liquids.  CIR recommended due to functional decline.   Review of Systems  Unable to perform ROS: Language          Past Medical History:  Diagnosis Date  . Arthritis             Past Surgical History:  Procedure Laterality Date  . APPENDECTOMY               Family History  Problem Relation Age of Onset  . Hypertension Mother    . Colon polyps Neg Hx    . Colon cancer Neg Hx    . Esophageal cancer Neg Hx    .  Rectal cancer Neg Hx    . Stomach cancer Neg Hx        Social History: Married. He is a Education officer, environmental and works part time. He  reports that he has never smoked. He has never used smokeless tobacco. He reports that he does not drink alcohol and does not use drugs.          Allergies  Allergen Reactions  . Sulfa Antibiotics Other (See Comments)      Nausea and eye swelling            Medications Prior to Admission  Medication Sig Dispense Refill  . Multiple Vitamins-Minerals (MENS 50+ MULTI VITAMIN/MIN PO) Take 1 Dose by mouth in the morning and at bedtime.      . mefloquine (LARIAM) 250 MG tablet Take 1 tablet (250 mg total) by mouth every 7 (seven) days. (Patient not taking: No sig reported) 16 tablet 0      Home: Home Living Family/patient expects to be discharged to:: Private residence Living Arrangements: Spouse/significant other,Other (Comment) (parents) Available Help at Discharge: Family,Available 24 hours/day Type of Home: House Home Access: Stairs to enter [employer] of Steps: 4 Entrance Stairs-Rails: Left Home Layout: One level Bathroom Shower/Tub: Health visitor: Standard Home Equipment: Shower seat - built in,Grab bars - toilet,Grab bars - tub/shower Additional Comments: has access to RW from pt's MIL  Lives With: Spouse  Functional History:  Prior Function Level of Independence: Independent 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 Functional Status:  Mobility: Bed Mobility Overal bed mobility: Needs Assistance Bed Mobility: Rolling,Sidelying to Sit,Sit to Supine Rolling: Min guard Sidelying to sit: Mod assist,+2 for physical assistance Sit to supine: Mod assist,+2 for physical assistance General bed mobility comments: Min guard for rolling to left, cues for use of bed rail, modA + 2 for LLE and trunk management. Transfers Overall transfer level: Needs assistance Equipment  used: None Transfers: Sit to/from Stand Sit to Stand: Max assist,+2 physical assistance General transfer comment: MaxA + 2 to stand x 2 from edge of bed, left knee block, good initiation and cues for looking up and upright posture. Ambulation/Gait General Gait Details: unable   ADL:   Cognition: Cognition Overall Cognitive Status:  (? has memory impairments- not certain if presentation is baseline) Arousal/Alertness: Awake/alert Orientation Level: Oriented to person,Disoriented to time,Disoriented to situation,Oriented to place Attention: Sustained Sustained Attention: Impaired Sustained Attention Impairment: Verbal basic,Functional basic Memory:  (hx of impairments- will assess further) Awareness: Impaired Awareness Impairment: Intellectual impairment,Emergent impairment,Anticipatory impairment Problem Solving: Impaired Problem Solving Impairment: Verbal basic,Functional basic Safety/Judgment: Impaired Cognition Arousal/Alertness: Awake/alert Behavior During Therapy: Flat affect Overall Cognitive Status:  (? has memory impairments- not certain if presentation is baseline) Area of Impairment: Orientation,Safety/judgement,Awareness,Problem solving,Following commands,Attention Orientation Level: Disoriented to,Time,Situation Current Attention Level: Sustained Following Commands: Follows one step commands inconsistently Safety/Judgement: Decreased awareness of safety,Decreased awareness of deficits Awareness: Intellectual Problem Solving: Requires verbal cues General Comments: Pt with expressive aphasia; able to state name and birthday. Reported month was "may," and unable to state year. When asked what brought him in, pt stating, "my brain." When asked how he did with therapy, pt stating, "wonderful." Decreased awareness of safety/deficits. Pt with history of short term memory deficits; was in process of getting diagnosis for dementia PTA. Following > 75% of 1 step commands.      Blood pressure (!) 145/82, pulse 64, temperature 98.3 F (36.8 C), temperature source Oral, resp. rate 11, weight 70.4 kg, SpO2 99 %. Physical Exam Vitals and nursing note reviewed.  Constitutional:      General: He is not in acute distress.    Appearance: Normal appearance.  HENT:     Head: Normocephalic and atraumatic.     Right Ear: External ear normal.     Left Ear: External ear normal.     Nose: Nose normal.  Eyes:     General:        Right eye: No discharge.        Left eye: No discharge.     Comments: Left eye ptosis  Cardiovascular:     Rate and Rhythm: Normal rate and regular rhythm.  Pulmonary:     Effort: Pulmonary effort is normal. No respiratory distress.     Breath sounds: No stridor.  Abdominal:     General: Abdomen is flat. Bowel sounds are normal. There is no distension.  Musculoskeletal:     Cervical back: Normal range of motion and neck supple.     Comments: No edema or tenderness in extremities  Skin:    General: Skin is warm and dry.  Neurological:     Mental Status: He is alert.     Comments: Alert Significant left facial weakness Dysarthria Right gaze prefrence with inability to move eyes to left field.  Oriented to self only. Place "[place]". He was unable to state place  or situation with cues. Unable to recall age or DOB.  Motor: LUE/LLE: 0/5 proximal and distal with increased tone   Psychiatric:     Comments: Limited due to speech/mentation        Lab Results Last 24 Hours       Results for orders placed or performed during the hospital encounter of 05/11/21 (from the past 24 hour(s))  CBC     Status: Abnormal    Collection Time: 05/12/21  3:41 AM  Result Value Ref Range    WBC 12.4 (H) 4.0 - 10.5 K/uL    RBC 5.17 4.22 - 5.81 MIL/uL    Hemoglobin 15.6 13.0 - 17.0 g/dL    HCT 16.1 09.6 - 04.5 %    MCV 89.4 80.0 - 100.0 fL    MCH 30.2 26.0 - 34.0 pg    MCHC 33.8 30.0 - 36.0 g/dL    RDW 40.9 81.1 - 91.4 %    Platelets 232 150 -  400 K/uL    nRBC 0.0 0.0 - 0.2 %  Comprehensive metabolic panel     Status: Abnormal    Collection Time: 05/12/21  3:41 AM  Result Value Ref Range    Sodium 138 135 - 145 mmol/L    Potassium 3.3 (L) 3.5 - 5.1 mmol/L    Chloride 100 98 - 111 mmol/L    CO2 25 22 - 32 mmol/L    Glucose, Bld 139 (H) 70 - 99 mg/dL    BUN 7 (L) 8 - 23 mg/dL    Creatinine, Ser 7.82 0.61 - 1.24 mg/dL    Calcium 9.7 8.9 - 95.6 mg/dL    Total Protein 7.5 6.5 - 8.1 g/dL    Albumin 4.3 3.5 - 5.0 g/dL    AST 24 15 - 41 U/L    ALT 19 0 - 44 U/L    Alkaline Phosphatase 108 38 - 126 U/L    Total Bilirubin 0.8 0.3 - 1.2 mg/dL    GFR, Estimated >21 >30 mL/min    Anion gap 13 5 - 15       Imaging Results (Last 48 hours)  CT HEAD WO CONTRAST   Result Date: 05/11/2021 CLINICAL DATA:  Stroke.  Intracranial hemorrhage follow-up. EXAM: CT HEAD WITHOUT CONTRAST TECHNIQUE: Contiguous axial images were obtained from the base of the skull through the vertex without intravenous contrast. COMPARISON:  Same day head CT. FINDINGS: Brain: Mild interval increase in the size of the right frontal intraparenchymal hemorrhage, measuring 4.5 x 2.6 x 3.5 cm (previously 4.2 x 2.3 x 3.1 cm). Similar adjacent small volume subarachnoid hemorrhage. A small amount of subdural hemorrhage in this region is also not excluded. Surrounding edema is similar. Similar local mass effect without midline shift. Basal cisterns are patent. No evidence of acute large vascular territory infarct elsewhere. Similar mild white matter hypodensities, likely chronic microvascular ischemic disease. No hydrocephalus. Vascular: No hyperdense vessel.  Calcific atherosclerosis. Skull: No acute fracture. Sinuses/Orbits: Small left frontal sinus osteoma. Mild right and moderate left maxillary sinus mucosal thickening. Unremarkable orbits. Other: No mastoid effusions. IMPRESSION: Mild interval increase in the size of the right frontal intraparenchymal hemorrhage, measuring 4.5 x  2.6 x 3.5 cm (previously 4.2 x 2.3 x 3.1 cm). Similar adjacent small volume hemorrhage. Electronically Signed   By: Feliberto Harts MD   On: 05/11/2021 17:05    MR BRAIN WO CONTRAST   Result Date: 05/12/2021 CLINICAL DATA:  65 year old male code stroke presentation with intra-axial right hemisphere  hemorrhage. Hypertensive (194/104) on presentation. EXAM: MRI HEAD WITHOUT CONTRAST TECHNIQUE: Multiplanar, multiecho pulse sequences of the brain and surrounding structures were obtained without intravenous contrast. COMPARISON:  CT head CTA head and neck 05/11/2021 FINDINGS: Brain: Coronal T2 weighted imaging could not be obtained. And some of the exam is intermittently degraded by motion artifact despite repeated imaging attempts. T1 isointense intra-axial hemorrhage in the posterior right frontal lobe demonstrates a layering hematocrit level (series 13, image 19) and blood encompasses 43 by 38 x 33 mm (AP by transverse by CC) for an estimated volume of 26 mL. Regional edema. Mild regional mass effect. Trace subarachnoid hemorrhage also suspected on axial FLAIR imaging. Additionally, on SWI there is also chronic hemosiderin in the right parietal lobe on series 19 image 34. But no other definite chronic blood products. Susceptibility related abnormal diffusion in the posterior right frontal lobe with no convincing larger area of restricted diffusion. No restricted diffusion elsewhere. No IVH or ventriculomegaly. Normal basilar cisterns. Negative pituitary and cervicomedullary junction. Wallace Cullens and white matter signal outside of the affected right hemisphere is largely normal for age with mild nonspecific white matter changes. Vascular: Major intracranial vascular flow voids are preserved. Skull and upper cervical spine: Visualized bone marrow signal is within normal limits. Grossly negative cervical spine. Sinuses/Orbits: Negative orbits. Mild to moderate maxillary sinus mucosal thickening. Other: Mastoids are clear.  Grossly normal visible internal auditory structures. IMPRESSION: 1. Posterior right frontal lobe intra-axial hemorrhage with layering hematocrit level has not significantly changed (estimated blood volume of 26 mL). Surrounding edema and mild regional mass effect. Trace subarachnoid hemorrhage. 2. No larger underlying infarct is evident. But chronic hemosiderin in the right parietal lobe indicates a previous intra-axial hemorrhage in the region. 3. No other acute intracranial abnormality. Electronically Signed   By: Odessa Fleming M.D.   On: 05/12/2021 07:26    MR MRV HEAD W WO CONTRAST   Result Date: 05/12/2021 CLINICAL DATA:  Intracranial hemorrhage. EXAM: MR VENOGRAM HEAD WITHOUT AND WITH CONTRAST TECHNIQUE: Angiographic images of the intracranial venous structures were acquired using MRV technique without and with intravenous contrast. CONTRAST:  71mL GADAVIST GADOBUTROL 1 MMOL/ML IV SOLN COMPARISON:  Brain MRI performed earlier today 05/12/2021. Noncontrast head CT examinations 05/11/2021. CT angiogram head/neck 05/11/2021. FINDINGS: The superior sagittal sinus, internal cerebral veins, vein of Galen, straight sinus, transverse sinuses, sigmoid sinuses and visualized jugular veins are patent. No appreciable intracranial venous thrombosis. Hypoplastic right transverse and sigmoid dural venous sinuses. An acute parenchymal hemorrhage within the posterior right frontal lobe is grossly unchanged in size as compared to the brain MRI performed earlier today. There is mild curvilinear vascular enhancement overlying the site of hemorrhage, which may be reactive. Elsewhere, no abnormal intracranial enhancement is identified. IMPRESSION: No evidence of intracranial venous thrombosis. Non dominant right transverse and sigmoid dural venous sinuses. Electronically Signed   By: Jackey Loge DO   On: 05/12/2021 11:27    ECHOCARDIOGRAM COMPLETE   Result Date: 05/11/2021    ECHOCARDIOGRAM REPORT   Patient Name:   A Rosie Place Date of Exam: 05/11/2021 Medical Rec #:  [id number]        Height:       68.0 in Accession #:    [id number]       Weight:       155.2 lb Date of Birth:  02-05-1956         BSA:          1.835 m Patient Age:  65 years         BP:           115/77 mmHg Patient Gender: M                HR:           84 bpm. Exam Location:  Inpatient Procedure: 2D Echo, Cardiac Doppler and Color Doppler Indications:    Stroke I63.9  History:        Patient has no prior history of Echocardiogram examinations.  Sonographer:    Roosvelt Maserachel Lane RDCS Referring Phys: [id number] Reyne DumasSTEVI W [facility] IMPRESSIONS  1. Left ventricular ejection fraction, by estimation, is 65 to 70%. The left ventricle has normal function. The left ventricle has no regional wall motion abnormalities. Left ventricular diastolic parameters were normal.  2. Right ventricular systolic function is normal. The right ventricular size is normal. Tricuspid regurgitation signal is inadequate for assessing PA pressure.  3. The mitral valve is normal in structure. No evidence of mitral valve regurgitation.  4. The aortic valve was not well visualized. Aortic valve regurgitation is not visualized. No aortic stenosis is present.  5. The inferior vena cava is normal in size with greater than 50% respiratory variability, suggesting right atrial pressure of 3 mmHg. FINDINGS  Left Ventricle: Left ventricular ejection fraction, by estimation, is 65 to 70%. The left ventricle has normal function. The left ventricle has no regional wall motion abnormalities. The left ventricular internal cavity size was normal in size. There is  no left ventricular hypertrophy. Left ventricular diastolic parameters were normal. Right Ventricle: The right ventricular size is normal. No increase in right ventricular wall thickness. Right ventricular systolic function is normal. Tricuspid regurgitation signal is inadequate for assessing PA pressure. Left Atrium: Left atrial size was normal in size. Right  Atrium: Right atrial size was normal in size. Pericardium: There is no evidence of pericardial effusion. Mitral Valve: The mitral valve is normal in structure. No evidence of mitral valve regurgitation. Tricuspid Valve: The tricuspid valve is normal in structure. Tricuspid valve regurgitation is not demonstrated. Aortic Valve: The aortic valve was not well visualized. Aortic valve regurgitation is not visualized. No aortic stenosis is present. Aortic valve mean gradient measures 3.0 mmHg. Aortic valve peak gradient measures 6.7 mmHg. Aortic valve area, by VTI measures 2.77 cm. Pulmonic Valve: The pulmonic valve was not well visualized. Pulmonic valve regurgitation is not visualized. Aorta: The aortic root and ascending aorta are structurally normal, with no evidence of dilitation. Venous: The inferior vena cava is normal in size with greater than 50% respiratory variability, suggesting right atrial pressure of 3 mmHg. IAS/Shunts: The interatrial septum was not well visualized.  LEFT VENTRICLE PLAX 2D LVIDd:         4.90 cm  Diastology LVIDs:         2.80 cm  LV e' medial:    8.38 cm/s LV PW:         0.80 cm  LV E/e' medial:  9.0 LV IVS:        0.90 cm  LV e' lateral:   9.14 cm/s LVOT diam:     1.90 cm  LV E/e' lateral: 8.2 LV SV:         69 LV SV Index:   37 LVOT Area:     2.84 cm  RIGHT VENTRICLE RV Basal diam:  2.90 cm LEFT ATRIUM           Index  RIGHT ATRIUM           Index LA diam:      3.10 cm 1.69 cm/m  RA Area:     11.20 cm LA Vol (A2C): 26.8 ml 14.61 ml/m RA Volume:   21.90 ml  11.94 ml/m LA Vol (A4C): 39.2 ml 21.36 ml/m  AORTIC VALVE AV Area (Vmax):    2.64 cm AV Area (Vmean):   2.59 cm AV Area (VTI):     2.77 cm AV Vmax:           129.00 cm/s AV Vmean:          86.700 cm/s AV VTI:            0.248 m AV Peak Grad:      6.7 mmHg AV Mean Grad:      3.0 mmHg LVOT Vmax:         120.00 cm/s LVOT Vmean:        79.200 cm/s LVOT VTI:          0.242 m LVOT/AV VTI ratio: 0.98  AORTA Ao Root diam: 2.50  cm Ao Asc diam:  3.30 cm MITRAL VALVE MV Area (PHT): 3.60 cm     SHUNTS MV Decel Time: 211 msec     Systemic VTI:  0.24 m MV E velocity: 75.30 cm/s   Systemic Diam: 1.90 cm MV A velocity: 107.00 cm/s MV E/A ratio:  0.70 Epifanio Lesches MD Electronically signed by Epifanio Lesches MD Signature Date/Time: 05/11/2021/5:28:15 PM    Final     CT HEAD CODE STROKE WO CONTRAST   Result Date: 05/11/2021 CLINICAL DATA:  Code stroke. Left-sided weakness, nausea, and vomiting. EXAM: CT HEAD WITHOUT CONTRAST TECHNIQUE: Contiguous axial images were obtained from the base of the skull through the vertex without intravenous contrast. COMPARISON:  None. FINDINGS: Brain: An acute parenchymal hemorrhage in the high posterior right frontal lobe measures 4.2 x 2.3 x 3.1 cm (approximate volume of 15 mL). There is mild surrounding edema without midline shift, and there is a small amount of adjacent subarachnoid hemorrhage. A very small amount of coexistent subdural hemorrhage is also not excluded in this region. No acute infarct is identified elsewhere. The ventricles are normal in size. Hypodensities in the cerebral white matter bilaterally are nonspecific but compatible with mild chronic small vessel ischemic disease. Vascular: No hyperdense vessel. Skull: No fracture or suspicious osseous lesion. Sinuses/Orbits: Small osteoma in the left frontal sinus. Mild right and moderate left maxillary sinus mucosal thickening. Clear mastoid air cells. Unremarkable orbits. Other: None. ASPECTS [facility] Stroke Program Early CT Score) Not scored in the presence of acute hemorrhage. IMPRESSION: Acute parenchymal hemorrhage in the posterior right frontal lobe with small amount of adjacent subarachnoid hemorrhage. These results were communicated to Dr. Iver Nestle at 9:08 am on 05/11/2021 by text page via the [facility] messaging system. Electronically Signed   By: Sebastian Ache M.D.   On: 05/11/2021 09:12    CT ANGIO HEAD NECK W WO CM (CODE  STROKE)   Result Date: 05/11/2021 CLINICAL DATA:  Stroke follow-up.  Left-sided weakness. EXAM: CT ANGIOGRAPHY HEAD AND NECK TECHNIQUE: Multidetector CT imaging of the head and neck was performed using the standard protocol during bolus administration of intravenous contrast. Multiplanar CT image reconstructions and MIPs were obtained to evaluate the vascular anatomy. Carotid stenosis measurements (when applicable) are obtained utilizing NASCET criteria, using the distal internal carotid diameter as the denominator. CONTRAST:  50mL OMNIPAQUE IOHEXOL 350 MG/ML SOLN COMPARISON:  Same  day CT head. FINDINGS: CTA NECK FINDINGS Aortic arch: Great vessel origins are patent. Right carotid system: No evidence of dissection, stenosis (50% or greater) or occlusion. Mild apparent irregularity of the ICA at the skull base in a region of streak artifact. Left carotid system: No evidence of dissection, stenosis (50% or greater) or occlusion. Mild apparent irregularity of the ICA at the skull base in a region of streak artifact. Vertebral arteries: Codominant. No evidence of dissection, stenosis (50% or greater) or occlusion. Skeleton: Moderate degenerative disc disease at at C5-C6 and C6-C7 with disc height loss, endplate sclerosis and posterior disc osteophyte complexes. Other neck: No acute abnormality Upper chest: Visualized lung apices are clear. Review of the MIP images confirms the above findings CTA HEAD FINDINGS Anterior circulation: No large vessel occlusion or proximal hemodynamically significant stenosis. Evaluation of the distal vasculature is limited due to venous contamination. No evidence of and arteriovenous malformation or aneurysm in the region of intraparenchymal hemorrhage, although acute blood products limit evaluation. Posterior circulation: No large vessel occlusion or proximal hemodynamically significant stenosis. Venous sinuses: The superior sagittal sinus is narrowed in the region of hemorrhage, most  likely from mass effect. Small right transverse and sigmoid sinuses. Review of the MIP images confirms the above findings IMPRESSION: CTA Head: 1. No large vessel occlusion or hemodynamically significant proximal arterial stenosis. 2. The superior sagittal sinus is narrowed in the region of hemorrhage with poorly visualized draining cortical veins in this region. While indeterminate, this may be secondary to mass effect given no definite/clear intraluminal thrombus. Given these findings and the location of hemorrhage; however, recommend low threshold for follow-up CTV or MRI with contrast to ensure stability and exclude worsening thrombosis. 3. No evidence of and arteriovenous malformation or aneurysm in the region of intraparenchymal hemorrhage, although acute blood products limit evaluation. Follow-up imaging after resolution of hemorrhage could provide further assessment if clinically indicated. CTA Neck: 1. No significant (greater than 50%) stenosis. 2. Mild apparent irregularity of bilateral ICAs at the skull base is favored artifactual given streak artifact from dental amalgam in this region. Fibromuscular dysplasia is a differential consideration. Findings and recommendations discussed with Dr. Iver Nestle via telephone at 9:59 a.m. Electronically Signed   By: Feliberto Harts MD   On: 05/11/2021 10:09       Assessment/Plan: Diagnosis: 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   Stroke: Continue secondary stroke prophylaxis and Risk Factor Modification listed below:   Blood Pressure Management:  Continue current medication with prn's with permisive HTN per primary team Diabetes management:   Left sided hemiparesis: fit for orthosis to prevent contractures (resting hand splint for day, wrist cock up splint at night, PRAFO, etc) PT/OT for mobility, ADL training  Motor recovery: Fluoxetine Labs and images (see above) independently reviewed.  Records reviewed and summated above.    1. Does the need for close, 24 hr/day medical supervision in concert with the patient's rehab needs make it unreasonable for this patient to be served in a less intensive setting? Yes  2. Co-Morbidities requiring supervision/potential complications: cognitive deficits with behavioral changes--in process of being worked up for Alzheimer's dementia, OSA (monitor for daytime somnolence and headaches),OA (ensure pain does not limit therapies), hypokalemia (continue to monitor and replete as necessary), leukocytosis (repeat labs, cont to monitor for signs and symptoms of infection, further workup if indicated) 3. Due to bladder management, bowel management, safety, skin/wound care, disease management, medication administration, pain management and patient education, does the patient require 24 hr/day rehab  nursing? Yes 4. Does the patient require coordinated care of a physician, rehab nurse, therapy disciplines of PT/OT/SLP to address physical and functional deficits in the context of the above medical diagnosis(es)? Yes Addressing deficits in the following areas: balance, endurance, locomotion, strength, transferring, bathing, dressing, toileting, cognition, speech, language, swallowing and psychosocial support 5. Can the patient actively participate in an intensive therapy program of at least 3 hrs of therapy per day at least 5 days per week? Yes 6. The potential for patient to make measurable gains while on inpatient rehab is excellent 7. Anticipated functional outcomes upon discharge from inpatient rehab are min assist  with PT, min assist with OT, supervision and min assist with SLP. 8. Estimated rehab length of stay to reach the above functional goals is: 27-30 days. 9. Anticipated discharge destination: TBD 10. Overall Rehab/Functional Prognosis: fair   RECOMMENDATIONS: This patient's condition is appropriate for continued rehabilitative care in the following setting: CIR adequate caregiver support  available upon discharge Patient has agreed to participate in recommended program. Potentially Note that insurance prior authorization may be required for reimbursement for recommended care.   Comment: Rehab Admissions Coordinator to follow up.   I have personally performed a face to face diagnostic evaluation, including, but not limited to relevant history and physical exam findings, of this patient and developed relevant assessment and plan.  Additionally, I have reviewed and concur with the physician assistant's documentation above.    Maryla Morrow, MD, ABPMR Jacquelynn Cree, PA-C 05/12/2021

## 2021-05-19 NOTE — TOC Transition Note (Signed)
Transition of Care [facility]) - CM/SW Discharge Note   Patient Details  Name: Donald [name] MRN: [id number] Date of Birth: 1956/10/26  [facility]) CM/SW Contact:  Kermit Balo, RN Phone Number: 05/19/2021, 10:20 AM   Clinical Narrative:    Patient is discharging to CIR today. CM signing off.   Final next level of care: IP Rehab Facility Barriers to Discharge: No Barriers Identified   Patient Goals and CMS Choice        Discharge Placement                       Discharge Plan and Services                                     Social Determinants of Health (SDOH) Interventions     Readmission Risk Interventions No flowsheet data found.

## 2021-05-19 NOTE — Progress Notes (Signed)
Inpatient Rehab Admissions Coordinator:    I have insurance approval and a bed available for pt to admit to CIR today. Dr. Tyson Babinski in agreement.  Will let pt/family and TOC team know.   Estill Dooms, PT, DPT Admissions Coordinator [phone] 05/19/21  10:33 AM

## 2021-05-19 NOTE — Progress Notes (Addendum)
PMR Admission Coordinator Pre-Admission Assessment   Patient: Donald [name] is an 65 y.o., male MRN: [id number] DOB: 12-29-1955 Height:   Weight: 69.6 kg                                                                                                                                                  Insurance Information HMO:     PPO: yes     PCP:      IPA:      80/20:      OTHER:  PRIMARY: [employer]      Policy#: [id number] geha      Subscriber: pt's spouse CM Name: Leretha Dykes      Phone#: [phone]     Fax#: [phone] Pre-Cert#: [id number] [place] for CIR given by Leretha Dykes with updates due to fax listed above to confirm admission within 3 days of admit on 6/13 (H&P and therapy evaluations)      Employer:  Benefits:  Phone #: [phone]     Name: n/a Eff. Date: 07/11/2015     Deduct: $350 (met $170.16)      Out of Pocket Max: $6500 (met $185.16)      Life Max: n/a  CIR: $700/day for days 1-21      SNF: 85% Outpatient: 85%     Co-Ins: 15% Home Health: 85%      Co-Pay: 15% DME: 85%     Co-Pay: 15% Providers:  SECONDARY: n/a      Policy#:       Phone#:    Financial Counselor:       Phone#:    The "Data Collection Information Summary" for patients in Inpatient Rehabilitation Facilities with attached "Privacy Act [place] Records" was provided and verbally reviewed with: Patient and Family   Emergency Contact Information         Contact Information     Name Relation Home Work Mobile    [place] Spouse [phone]   [phone]    [place], Corsetti Daughter     [phone]       Current Medical History  Patient Admitting Diagnosis: R posterior frontal lobe intraparenchymal hemorrage   History of Present Illness: Donald [name] is a 65 y.o. RH-male with history of cognitive deficits with behavioral changes--in process of being worked up for Alzheimer's dementia, OSA,OA who was admitted on 05/11/2021 with acute left hemiparesis and sensory deficits, right gaze  preference and left facial droop.  UDS negative.  BP elevated at admission--194/104 and CT head done showing 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  CTA head/neck was negative for LVO and showed narrowing of superior sagittal sinus with question of mass-effect versus intraluminal thrombus.  Follow-up MRI/MRA brain showed no significant change in bleed with surrounding edema and mild mass-effect and chronic hemosiderin right parietal lobe indicating site of prior hemorrhage.  He was started on Cleviprex for BP  control and Dr. Erlinda Hong question CAA. Echocardiogram with ejection fraction of 65 to 70%, no regional wall abnormality. MRV brain done showing no evidence of intracranial venous thrombosis and mild vascular enhancement could be reactive.  Work-up ongoing and neurology recommends repeat CTA head/MRI once hematoma resolves on outpatient basis to rule out underlying source of bleed.  Therapy evaluation initiated and patient noted to be limited by left hemiplegia with left neglect and extensor tone.  Swallow evaluation done and patient started on dysphagia 1 thin liquids.  CIR recommended due to functional decline.   Complete NIHSS TOTAL: 11 Glasgow Coma Scale Score: 15   Past Medical History      Past Medical History:  Diagnosis Date   Arthritis        Family History  family history includes Hypertension in his mother.   Prior Rehab/Hospitalizations:  Has the patient had prior rehab or hospitalizations prior to admission? No   Has the patient had major surgery during 100 days prior to admission? No   Current Medications    Current Facility-Administered Medications:    0.9 %  sodium chloride infusion, , Intravenous, PRN, Rosalin Hawking, MD, Stopped at 05/11/21 1324   acetaminophen (TYLENOL) tablet 650 mg, 650 mg, Oral, Q4H PRN, 650 mg at 05/17/21 2318 **OR** acetaminophen (TYLENOL) 160 MG/5ML solution 650 mg, 650 mg, Per Tube, Q4H PRN, 650 mg at 05/16/21 0233 **OR** acetaminophen  (TYLENOL) suppository 650 mg, 650 mg, Rectal, Q4H PRN, Rikki Spearing, NP, 650 mg at 05/11/21 2106   amLODipine (NORVASC) tablet 10 mg, 10 mg, Oral, Daily, Rosalin Hawking, MD, 10 mg at 05/19/21 0941   Chlorhexidine Gluconate Cloth 2 % PADS 6 each, 6 each, Topical, Daily, Rosalin Hawking, MD, 6 each at 05/19/21 0944   heparin injection 5,000 Units, 5,000 Units, Subcutaneous, Q8H, Rosalin Hawking, MD, 5,000 Units at 05/19/21 0550   hydrochlorothiazide (MICROZIDE) capsule 12.5 mg, 12.5 mg, Oral, Daily, Pokhrel, Laxman, MD, 12.5 mg at 05/19/21 0941   labetalol (NORMODYNE) injection 5-10 mg, 5-10 mg, Intravenous, Q2H PRN, Rosalin Hawking, MD, 10 mg at 05/13/21 1636   MEDLINE mouth rinse, 15 mL, Mouth Rinse, BID, Rosalin Hawking, MD, 15 mL at 05/19/21 0944   pantoprazole (PROTONIX) EC tablet 40 mg, 40 mg, Oral, Daily, Rosalin Hawking, MD, 40 mg at 05/19/21 0942   polyethylene glycol (MIRALAX / GLYCOLAX) packet 17 g, 17 g, Oral, BID, Pokhrel, Laxman, MD, 17 g at 05/19/21 0941   senna-docusate (Senokot-S) tablet 1 tablet, 1 tablet, Oral, BID, Ebbie Latus, Stevi W, NP, 1 tablet at 05/19/21 0941   sodium chloride flush (NS) 0.9 % injection 3 mL, 3 mL, Intravenous, Once, Sherwood Gambler, MD   Patients Current Diet:     Diet Order                      Diet - low sodium heart healthy              DIET DYS 2 Room service appropriate? 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   Self Care: Did the patient need help bathing, dressing, using the toilet or eating?  Independent   Indoor Mobility: Did the patient need assistance with walking from room to room (with or without device)? Independent   Stairs: Did the patient need assistance with internal or external stairs (with or without device)? Independent   Functional Cognition: Did the patient need help planning regular tasks such as shopping or remembering to take medications? Needed some help   Home Assistive Devices / [place] Devices/Equipment: None Home Equipment: Shower seat - built in,Grab bars - toilet,Grab bars - tub/shower   Prior Device Use: Indicate devices/aids used by the patient prior to current illness, exacerbation or injury? None of the above   Current Functional Level Cognition   Arousal/Alertness: Awake/alert Overall Cognitive Status: Impaired/Different from baseline Current Attention Level: Sustained Orientation Level: Oriented X4 Following Commands: Follows one step commands with increased time,Follows one step commands inconsistently Safety/Judgement: Decreased awareness of safety,Decreased awareness of deficits General Comments: pt maintains poor awareness of deficits particularly with left inattention and neglect. Mod cues throughout for attention and balance Attention: Sustained Sustained Attention: Impaired Sustained Attention Impairment: Verbal basic,Functional basic Memory:  (hx of impairments- will assess further) Awareness: Impaired Awareness Impairment: Intellectual impairment,Emergent impairment,Anticipatory impairment Problem Solving: Impaired Problem Solving Impairment: Verbal basic,Functional basic Safety/Judgment: Impaired    Extremity Assessment (includes Sensation/Coordination)   Upper Extremity Assessment: LUE  deficits/detail RUE Deficits / Details: AROM, WFLs; strength 5/5 LUE Deficits / Details: No grasp strength or hand AROM. Flexion syngery pattern at bicep. No active movement at scapula and noting increased tone. LUE Sensation: decreased light touch,decreased proprioception LUE Coordination: decreased fine motor,decreased gross motor  Lower Extremity Assessment: Defer to PT evaluation LLE Deficits / Details: no active movement     ADLs   Overall ADL's : Needs assistance/impaired Eating/Feeding: NPO Grooming: Moderate assistance,Sitting,Cueing for sequencing Grooming Details (indicate cue type and reason): Pt requiring Min-Mod A for sitting balance in chair while performing oral care. Pt requiring mod A to incorporate LUE into task. Max cues throughout for sequencing and visual scanning to L Upper Body Bathing: Moderate assistance Lower Body Bathing: Maximal assistance,Sitting/lateral leans,Sit to/from stand,Bed level Upper Body Dressing : Moderate assistance,Sitting,Standing,Bed level Lower Body Dressing: Maximal assistance,Sitting/lateral leans,Sit to/from stand,Bed level,Cueing for safety Toilet Transfer: Maximal assistance,+2 for physical assistance,+2 for safety/equipment,BSC Toilet Transfer Details (indicate cue type and reason): simulated for sit to stand, but requires near [place] to move feet Toileting- Clothing Manipulation and Hygiene: Maximal assistance,+2 for physical assistance,Sitting/lateral lean,Sit to/from stand Functional mobility during ADLs: Maximal assistance,+2 for physical assistance,+2 for safety/equipment,Rolling walker,Cueing for safety (multiple sit to stands with PT; L knee blocked) General ADL Comments: Focused session on sitting balance, visual scanning, cognition, and incorporation of LUE into task.     Mobility   Overal bed mobility: Needs Assistance Bed Mobility: Rolling,Sidelying to Sit Rolling: Min assist Sidelying to sit: Mod assist Supine to sit: Mod  assist,HOB elevated Sit to supine: Mod assist,+2 for physical assistance General bed mobility comments: pt needed tactile and vc's to initiate and keep moving through transitions. Assist to hook LLE with R foot. Mod A for LE's off EOB. Mod HHA for SL to sit  Transfers   Overall transfer level: Needs assistance Equipment used: 2 person hand held assist Transfers: Sit to/from [place] to Stand: +2 physical assistance,Max assist Stand pivot transfers: +2 physical assistance,Max assist General transfer comment: pt stood and pivoted bed to [facility] then BSC to recliner. First stand pt needed max A +2 for power up and did not gain full balance in standing, needed max facilitation at hips to shift to R to sit. With standing from [facility] pt needed mod A +2  and was able to come to full upright posture and maintain >1 min but needed max A to step R foot toward chair to sit. Pt needed L knee blocked for both transfers     Ambulation / Gait / Stairs / Wheelchair Mobility   Ambulation/Gait General Gait Details: pregait wt shifting in standing     Posture / Balance Dynamic Sitting Balance Sitting balance - Comments: worked on reaching with RUE to L and bringing chest fwd. Needed mod A to sit initially but after reaching and wt shifting was able to sit with close S for 3 mins Balance Overall balance assessment: Needs assistance Sitting-balance support: Feet supported,Single extremity supported Sitting balance-Leahy Scale: Poor Sitting balance - Comments: worked on reaching with RUE to L and bringing chest fwd. Needed mod A to sit initially but after reaching and wt shifting was able to sit with close S for 3 mins Postural control: Left lateral lean,Posterior lean Standing balance support: Bilateral upper extremity supported Standing balance-Leahy Scale: Poor Standing balance comment: mod assist to maintain standing with left knee blocked and left lean     Special needs/care  consideration n/a        Previous Home Environment (from acute therapy documentation) Living Arrangements: Spouse/significant other,Other (Comment) (parents)  Lives With: Spouse Available Help at Discharge: Family,Available 24 hours/day Type of Home: House Home Layout: One level Home Access: Stairs to enter Entrance Stairs-Rails: Left Entrance Stairs-Number of Steps: 4 Bathroom Shower/Tub: Multimedia programmer: Standard Home Care Services: No Additional Comments: has access to RW from pt's MIL   Discharge Living Setting Plans for Discharge Living Setting: Patient's home,Lives with (comment) (spouse, [name]) Type of Home at Discharge: House Discharge Home Layout: One level Discharge Home Access: Stairs to enter Entrance Stairs-Rails: Left Entrance Stairs-Number of Steps: 4 Discharge Bathroom Shower/Tub: Walk-in shower Discharge Bathroom Toilet: Standard Discharge Bathroom Accessibility: Yes How Accessible: Accessible via walker Does the patient have any problems obtaining your medications?: No   Social/Family/Support Systems Patient Roles: Spouse Anticipated Caregiver: spouse, Kwamane Whack Anticipated Caregiver's Contact Information: [phone] Ability/Limitations of Caregiver: supervision to min assist (ADLs) Caregiver Availability: 24/7 Discharge Plan Discussed with Primary Caregiver: Yes Is Caregiver In Agreement with Plan?: Yes Does Caregiver/Family have Issues with Lodging/Transportation while Pt is in Rehab?: No     Goals Patient/Family Goal for Rehab: PT/OT min assist, SLP supervision to min assist Expected length of stay: 27-30 days Additional Information: outpatient workup for cognitive decline in progress prior to admit Pt/Family Agrees to Admission and willing to participate: Yes Program Orientation Provided & Reviewed with Pt/Caregiver Including Roles  & Responsibilities: Yes  Barriers to Discharge: Insurance for SNF coverage      Decrease burden of Care through IP rehab admission: na/   Possible need for SNF placement upon discharge: Not anticipated     Patient Condition: This patient's medical and functional status has changed since the consult dated: 05/12/2021 in which the Rehabilitation Physician determined and documented that the patient's  condition is appropriate for intensive rehabilitative care in an inpatient rehabilitation facility. See "History of Present Illness" (above) for medical update. Functional changes are: pt mod/max +2. Patient's medical and functional status update has been discussed with the Rehabilitation physician and patient remains appropriate for inpatient rehabilitation. Will admit to inpatient rehab today.   Preadmission Screen Completed By:  Michel Santee, PT, DPT 05/19/2021 10:33 AM ______________________________________________________________________   Discussed status with Dr. Letta Pate on 05/19/21 at 10:33 AM  and received approval for admission today.   Admission Coordinator:  Michel Santee, PT, DPT time 10:34 AM Sudie Grumbling 05/19/21          Cosigned by: Charlett Blake, MD at 05/19/2021 11:20 AM

## 2021-05-19 NOTE — Discharge Summary (Addendum)
Physician Discharge Summary  Donald [name] QMG:[id number] DOB: Oct 26, 1956 DOA: 05/11/2021  PCP: Donald Flood, MD  Admit date: 05/11/2021 Discharge date: 05/20/2021  Admitted From: Home  Discharge disposition: CIR   Recommendations for Outpatient Follow-Up:   Follow up with your primary care provider in one week.  Check CBC, BMP, magnesium in the next visit Focus on blood pressure control with systolic blood pressure goal of 120-140. Advance diet as per speech therapy.   Discharge Diagnosis:   Active Problems:   ICH (intracerebral hemorrhage) (HCC)   Memory deficits   OSA (obstructive sleep apnea)   Generalized OA   Hypokalemia   Leukocytosis   Discharge Condition: Improved.  Diet recommendation: Dysphagia 2 diet.  Wound care: None.  Code status: Full.   History of Present Illness:   Donald [name] is a 65 y.o. male with a medical history significant for dementia with behavioral disturbance and reported OSA not on CPAP per wife who presented to the ED via EMS for evaluation of acute onset of left hemiparesis, right gaze, and left mouth droop. Donald [name] had woken up in his normal state of health this morning. He and his wife had intercourse this morning and when she got up to get ready for the day, she noticed that Donald [name] was acting "off". She asked him to get out of bed and he stated that he was unable to move his left side and she noticed that he was looking towards the right so she activated EMS. His initial manual blood pressure at 08:15 was 194/104 and on arrival to the hospital around 09:00 his blood pressure was 122/74. On arrival, Donald [name] was noted to have persistent left hemiparesis, right gaze, neck pain, left mouth droop, and decreased sensation on the left and was immediately taken to CT for stroke evaluation. As baseline, Donald [name] is able to care for himself at home but has some short term memory deficits and had a scheduled lumbar puncture  for outpatient evaluation of possible Alzheimer's Dementia at [facility].  At this time, patient is stable and is awaiting for CIR placement.Marland Kitchen     Hospital Course:   Following conditions were addressed during hospitalization as listed below,  Acute parenchymal hemorrhage in the posterior right frontal lobe with right-sided hemiplegia.  Patient was initially admitted to the ICU for closer monitoring.  Focus on blood pressure control with systolic blood pressure goal of 120-140.  Initially required Cleviprex and labetalol in the ICU.  Hemoglobin A1c of 5.8. Lipid profile noted with total cholesterol 232 and LDL of 140.  MRI of the brain showed posterior right frontal lobe intra-axial hemorrhage with layering and trace subarachnoid hemorrhage.  2D echocardiogram showed preserved LV function at 65 to 70%.  Patient has been seen by physical therapy recommended CIR. neurology recommending repeat CTA head and MRI after the hematoma resolves and recommends outpatient follow-up with [facility] neurology.  Speech therapy has recommended dysphagia 2 diet.   Statin goal for LDL less than 70.    Will continue Lipitor 20 on discharge.    Patient has remained stable.  Spoke with the patient's spouse on the phone for follow-up at [facility] neurology  Obstructive sleep apnea not on CPAP   Hypertensive Urgency  Initial blood pressure was 194/ 104.  Patient received IV drip with adequate blood pressure control.  Currently on amlodipine and hydrochlorothiazide.  Long-term goal between 120-140. Marland Kitchen    Hyperlipidemia.  Continue Lipitor 20 mg daily.   History of cognitive  dysfunction and dementia.  Follows up with [facility] neurology as outpatient.  He was supposed to have a lumbar puncture as outpatient.       Dysphagia.  On dysphagia 2 diet.  Advance as tolerated as per speech therapy.   Leukocytosis likely reactive.  Normalized at this time.   Hypokalemia improved.  Check BMP periodically.  Constipation.    Continue bowel  regimen.  Disposition.  At this time, patient is stable for disposition to CIR.  Spoke with the patient's spouse on the phone.  Medical Consultants:   Neurology Procedures:    None Subjective:   Today, patient was seen and examined at bedside.  Denies interval complaints.  Denies any nausea, vomiting, fever, shortness of breath  Discharge Exam:   Vitals:   05/19/21 0331 05/19/21 0747  BP: 138/73 134/81  Pulse: 85 73  Resp: 14 18  Temp: 98.1 F (36.7 C) 98.3 F (36.8 C)  SpO2: 99% 100%   Vitals:   05/18/21 1935 05/18/21 2345 05/19/21 0331 05/19/21 0747  BP: 130/77 133/75 138/73 134/81  Pulse: 88 89 85 73  Resp: Temp: 98.3 F (36.8 C) 98 F (36.7 C) 98.1 F (36.7 C) 98.3 F (36.8 C)  TempSrc: Oral Oral Oral   SpO2: 100% 98% 99% 100%  Weight:        General: Alert awake, not in obvious distress HENT: pupils equally reacting to light,  No scleral pallor or icterus noted. Oral mucosa is moist.  Chest:  Clear breath sounds.  Diminished breath sounds bilaterally. No crackles or wheezes.  CVS: S1 &S2 heard. No murmur.  Regular rate and rhythm. Abdomen: Soft, nontender, nondistended.  Bowel sounds are heard.   Extremities: No cyanosis, clubbing or edema.  Peripheral pulses are palpable. Psych: Alert, awake and oriented, normal mood CNS: Right-sided hemiplegia. Skin: Warm and dry.  No rashes noted.  The results of significant diagnostics from this hospitalization (including imaging, microbiology, ancillary and laboratory) are listed below for reference.     Diagnostic Studies:   CT HEAD WO CONTRAST  Result Date: 05/11/2021 CLINICAL DATA:  Stroke.  Intracranial hemorrhage follow-up. EXAM: CT HEAD WITHOUT CONTRAST TECHNIQUE: Contiguous axial images were obtained from the base of the skull through the vertex without intravenous contrast. COMPARISON:  Same day head CT. FINDINGS: Brain: Mild interval increase in the size of the right frontal intraparenchymal  hemorrhage, measuring 4.5 x 2.6 x 3.5 cm (previously 4.2 x 2.3 x 3.1 cm). Similar adjacent small volume subarachnoid hemorrhage. A small amount of subdural hemorrhage in this region is also not excluded. Surrounding edema is similar. Similar local mass effect without midline shift. Basal cisterns are patent. No evidence of acute large vascular territory infarct elsewhere. Similar mild white matter hypodensities, likely chronic microvascular ischemic disease. No hydrocephalus. Vascular: No hyperdense vessel.  Calcific atherosclerosis. Skull: No acute fracture. Sinuses/Orbits: Small left frontal sinus osteoma. Mild right and moderate left maxillary sinus mucosal thickening. Unremarkable orbits. Other: No mastoid effusions. IMPRESSION: Mild interval increase in the size of the right frontal intraparenchymal hemorrhage, measuring 4.5 x 2.6 x 3.5 cm (previously 4.2 x 2.3 x 3.1 cm). Similar adjacent small volume hemorrhage. Electronically Signed   By: Feliberto Harts MD   On: 05/11/2021 17:05   MR BRAIN WO CONTRAST  Result Date: 05/12/2021 CLINICAL DATA:  65 year old male code stroke presentation with intra-axial right hemisphere hemorrhage. Hypertensive (194/104) on presentation. EXAM: MRI HEAD WITHOUT CONTRAST TECHNIQUE: Multiplanar, multiecho pulse sequences of the brain  and surrounding structures were obtained without intravenous contrast. COMPARISON:  CT head CTA head and neck 05/11/2021 FINDINGS: Brain: Coronal T2 weighted imaging could not be obtained. And some of the exam is intermittently degraded by motion artifact despite repeated imaging attempts. T1 isointense intra-axial hemorrhage in the posterior right frontal lobe demonstrates a layering hematocrit level (series 13, image 19) and blood encompasses 43 by 38 x 33 mm (AP by transverse by CC) for an estimated volume of 26 mL. Regional edema. Mild regional mass effect. Trace subarachnoid hemorrhage also suspected on axial FLAIR imaging. Additionally, on  SWI there is also chronic hemosiderin in the right parietal lobe on series 19 image 34. But no other definite chronic blood products. Susceptibility related abnormal diffusion in the posterior right frontal lobe with no convincing larger area of restricted diffusion. No restricted diffusion elsewhere. No IVH or ventriculomegaly. Normal basilar cisterns. Negative pituitary and cervicomedullary junction. Wallace Cullens and white matter signal outside of the affected right hemisphere is largely normal for age with mild nonspecific white matter changes. Vascular: Major intracranial vascular flow voids are preserved. Skull and upper cervical spine: Visualized bone marrow signal is within normal limits. Grossly negative cervical spine. Sinuses/Orbits: Negative orbits. Mild to moderate maxillary sinus mucosal thickening. Other: Mastoids are clear. Grossly normal visible internal auditory structures. IMPRESSION: 1. Posterior right frontal lobe intra-axial hemorrhage with layering hematocrit level has not significantly changed (estimated blood volume of 26 mL). Surrounding edema and mild regional mass effect. Trace subarachnoid hemorrhage. 2. No larger underlying infarct is evident. But chronic hemosiderin in the right parietal lobe indicates a previous intra-axial hemorrhage in the region. 3. No other acute intracranial abnormality. Electronically Signed   By: Odessa Fleming M.D.   On: 05/12/2021 07:26   MR MRV HEAD W WO CONTRAST  Result Date: 05/12/2021 CLINICAL DATA:  Intracranial hemorrhage. EXAM: MR VENOGRAM HEAD WITHOUT AND WITH CONTRAST TECHNIQUE: Angiographic images of the intracranial venous structures were acquired using MRV technique without and with intravenous contrast. CONTRAST:  7mL GADAVIST GADOBUTROL 1 MMOL/ML IV SOLN COMPARISON:  Brain MRI performed earlier today 05/12/2021. Noncontrast head CT examinations 05/11/2021. CT angiogram head/neck 05/11/2021. FINDINGS: The superior sagittal sinus, internal cerebral veins, vein  of Galen, straight sinus, transverse sinuses, sigmoid sinuses and visualized jugular veins are patent. No appreciable intracranial venous thrombosis. Hypoplastic right transverse and sigmoid dural venous sinuses. An acute parenchymal hemorrhage within the posterior right frontal lobe is grossly unchanged in size as compared to the brain MRI performed earlier today. There is mild curvilinear vascular enhancement overlying the site of hemorrhage, which may be reactive. Elsewhere, no abnormal intracranial enhancement is identified. IMPRESSION: No evidence of intracranial venous thrombosis. Non dominant right transverse and sigmoid dural venous sinuses. Electronically Signed   By: Jackey Loge DO   On: 05/12/2021 11:27   ECHOCARDIOGRAM COMPLETE  Result Date: 05/11/2021    ECHOCARDIOGRAM REPORT   Patient Name:   [facility] Date of Exam: 05/11/2021 Medical Rec #:  [id number]        Height:       68.0 in Accession #:    [id number]       Weight:       155.2 lb Date of Birth:  February 22, 1956         BSA:          1.835 m Patient Age:    65 years         BP:           115/77  mmHg Patient Gender: M                HR:           84 bpm. Exam Location:  Inpatient Procedure: 2D Echo, Cardiac Doppler and Color Doppler Indications:    Stroke I63.9  History:        Patient has no prior history of Echocardiogram examinations.  Sonographer:    Roosvelt Maserachel Lane RDCS Referring Phys: [id number] Reyne DumasSTEVI W Asante [facility] IMPRESSIONS  1. Left ventricular ejection fraction, by estimation, is 65 to 70%. The left ventricle has normal function. The left ventricle has no regional wall motion abnormalities. Left ventricular diastolic parameters were normal.  2. Right ventricular systolic function is normal. The right ventricular size is normal. Tricuspid regurgitation signal is inadequate for assessing PA pressure.  3. The mitral valve is normal in structure. No evidence of mitral valve regurgitation.  4. The aortic valve was not well visualized. Aortic valve  regurgitation is not visualized. No aortic stenosis is present.  5. The inferior vena cava is normal in size with greater than 50% respiratory variability, suggesting right atrial pressure of 3 mmHg. FINDINGS  Left Ventricle: Left ventricular ejection fraction, by estimation, is 65 to 70%. The left ventricle has normal function. The left ventricle has no regional wall motion abnormalities. The left ventricular internal cavity size was normal in size. There is  no left ventricular hypertrophy. Left ventricular diastolic parameters were normal. Right Ventricle: The right ventricular size is normal. No increase in right ventricular wall thickness. Right ventricular systolic function is normal. Tricuspid regurgitation signal is inadequate for assessing PA pressure. Left Atrium: Left atrial size was normal in size. Right Atrium: Right atrial size was normal in size. Pericardium: There is no evidence of pericardial effusion. Mitral Valve: The mitral valve is normal in structure. No evidence of mitral valve regurgitation. Tricuspid Valve: The tricuspid valve is normal in structure. Tricuspid valve regurgitation is not demonstrated. Aortic Valve: The aortic valve was not well visualized. Aortic valve regurgitation is not visualized. No aortic stenosis is present. Aortic valve mean gradient measures 3.0 mmHg. Aortic valve peak gradient measures 6.7 mmHg. Aortic valve area, by VTI measures 2.77 cm. Pulmonic Valve: The pulmonic valve was not well visualized. Pulmonic valve regurgitation is not visualized. Aorta: The aortic root and ascending aorta are structurally normal, with no evidence of dilitation. Venous: The inferior vena cava is normal in size with greater than 50% respiratory variability, suggesting right atrial pressure of 3 mmHg. IAS/Shunts: The interatrial septum was not well visualized.  LEFT VENTRICLE PLAX 2D LVIDd:         4.90 cm  Diastology LVIDs:         2.80 cm  LV e' medial:    8.38 cm/s LV PW:          0.80 cm  LV E/e' medial:  9.0 LV IVS:        0.90 cm  LV e' lateral:   9.14 cm/s LVOT diam:     1.90 cm  LV E/e' lateral: 8.2 LV SV:         69 LV SV Index:   37 LVOT Area:     2.84 cm  RIGHT VENTRICLE RV Basal diam:  2.90 cm LEFT ATRIUM           Index       RIGHT ATRIUM           Index LA diam:      3.10  cm 1.69 cm/m  RA Area:     11.20 cm LA Vol (A2C): 26.8 ml 14.61 ml/m RA Volume:   21.90 ml  11.94 ml/m LA Vol (A4C): 39.2 ml 21.36 ml/m  AORTIC VALVE AV Area (Vmax):    2.64 cm AV Area (Vmean):   2.59 cm AV Area (VTI):     2.77 cm AV Vmax:           129.00 cm/s AV Vmean:          86.700 cm/s AV VTI:            0.248 m AV Peak Grad:      6.7 mmHg AV Mean Grad:      3.0 mmHg LVOT Vmax:         120.00 cm/s LVOT Vmean:        79.200 cm/s LVOT VTI:          0.242 m LVOT/AV VTI ratio: 0.98  AORTA Ao Root diam: 2.50 cm Ao Asc diam:  3.30 cm MITRAL VALVE MV Area (PHT): 3.60 cm     SHUNTS MV Decel Time: 211 msec     Systemic VTI:  0.24 m MV E velocity: 75.30 cm/s   Systemic Diam: 1.90 cm MV A velocity: 107.00 cm/s MV E/A ratio:  0.70 Epifanio Lesches MD Electronically signed by Epifanio Lesches MD Signature Date/Time: 05/11/2021/5:28:15 PM    Final    CT HEAD CODE STROKE WO CONTRAST  Result Date: 05/11/2021 CLINICAL DATA:  Code stroke. Left-sided weakness, nausea, and vomiting. EXAM: CT HEAD WITHOUT CONTRAST TECHNIQUE: Contiguous axial images were obtained from the base of the skull through the vertex without intravenous contrast. COMPARISON:  None. FINDINGS: Brain: An acute parenchymal hemorrhage in the high posterior right frontal lobe measures 4.2 x 2.3 x 3.1 cm (approximate volume of 15 mL). There is mild surrounding edema without midline shift, and there is a small amount of adjacent subarachnoid hemorrhage. A very small amount of coexistent subdural hemorrhage is also not excluded in this region. No acute infarct is identified elsewhere. The ventricles are normal in size. Hypodensities in the  cerebral white matter bilaterally are nonspecific but compatible with mild chronic small vessel ischemic disease. Vascular: No hyperdense vessel. Skull: No fracture or suspicious osseous lesion. Sinuses/Orbits: Small osteoma in the left frontal sinus. Mild right and moderate left maxillary sinus mucosal thickening. Clear mastoid air cells. Unremarkable orbits. Other: None. ASPECTS [facility] Stroke Program Early CT Score) Not scored in the presence of acute hemorrhage. IMPRESSION: Acute parenchymal hemorrhage in the posterior right frontal lobe with small amount of adjacent subarachnoid hemorrhage. These results were communicated to Dr. Iver Nestle at 9:08 am on 05/11/2021 by text page via the [facility] messaging system. Electronically Signed   By: Sebastian Ache M.D.   On: 05/11/2021 09:12   CT ANGIO HEAD NECK W WO CM (CODE STROKE)  Result Date: 05/11/2021 CLINICAL DATA:  Stroke follow-up.  Left-sided weakness. EXAM: CT ANGIOGRAPHY HEAD AND NECK TECHNIQUE: Multidetector CT imaging of the head and neck was performed using the standard protocol during bolus administration of intravenous contrast. Multiplanar CT image reconstructions and MIPs were obtained to evaluate the vascular anatomy. Carotid stenosis measurements (when applicable) are obtained utilizing NASCET criteria, using the distal internal carotid diameter as the denominator. CONTRAST:  39mL OMNIPAQUE IOHEXOL 350 MG/ML SOLN COMPARISON:  Same day CT head. FINDINGS: CTA NECK FINDINGS Aortic arch: Great vessel origins are patent. Right carotid system: No evidence of dissection, stenosis (50% or greater) or  occlusion. Mild apparent irregularity of the ICA at the skull base in a region of streak artifact. Left carotid system: No evidence of dissection, stenosis (50% or greater) or occlusion. Mild apparent irregularity of the ICA at the skull base in a region of streak artifact. Vertebral arteries: Codominant. No evidence of dissection, stenosis (50% or greater) or  occlusion. Skeleton: Moderate degenerative disc disease at at C5-C6 and C6-C7 with disc height loss, endplate sclerosis and posterior disc osteophyte complexes. Other neck: No acute abnormality Upper chest: Visualized lung apices are clear. Review of the MIP images confirms the above findings CTA HEAD FINDINGS Anterior circulation: No large vessel occlusion or proximal hemodynamically significant stenosis. Evaluation of the distal vasculature is limited due to venous contamination. No evidence of and arteriovenous malformation or aneurysm in the region of intraparenchymal hemorrhage, although acute blood products limit evaluation. Posterior circulation: No large vessel occlusion or proximal hemodynamically significant stenosis. Venous sinuses: The superior sagittal sinus is narrowed in the region of hemorrhage, most likely from mass effect. Small right transverse and sigmoid sinuses. Review of the MIP images confirms the above findings IMPRESSION: CTA Head: 1. No large vessel occlusion or hemodynamically significant proximal arterial stenosis. 2. The superior sagittal sinus is narrowed in the region of hemorrhage with poorly visualized draining cortical veins in this region. While indeterminate, this may be secondary to mass effect given no definite/clear intraluminal thrombus. Given these findings and the location of hemorrhage; however, recommend low threshold for follow-up CTV or MRI with contrast to ensure stability and exclude worsening thrombosis. 3. No evidence of and arteriovenous malformation or aneurysm in the region of intraparenchymal hemorrhage, although acute blood products limit evaluation. Follow-up imaging after resolution of hemorrhage could provide further assessment if clinically indicated. CTA Neck: 1. No significant (greater than 50%) stenosis. 2. Mild apparent irregularity of bilateral ICAs at the skull base is favored artifactual given streak artifact from dental amalgam in this region.  Fibromuscular dysplasia is a differential consideration. Findings and recommendations discussed with Dr. Iver Nestle via telephone at 9:59 a.m. Electronically Signed   By: Feliberto Harts MD   On: 05/11/2021 10:09     Labs:   Basic Metabolic Panel: Recent Labs  Lab 05/13/21 0338 05/14/21 0153 05/18/21 0446  NA 138 138 134*  K 3.7 3.8 4.6  CL 102 105 95*  CO2 GLUCOSE 116* 108* 116*  BUN CREATININE 1.12 1.03 1.23  CALCIUM 9.5 9.5 10.1  MG  --  2.1 2.7*  PHOS  --  2.9  --    GFR CrCl cannot be calculated (Unknown ideal weight.). Liver Function Tests: No results for input(s): AST, ALT, ALKPHOS, BILITOT, PROT, ALBUMIN in the last 168 hours. No results for input(s): LIPASE, AMYLASE in the last 168 hours. No results for input(s): AMMONIA in the last 168 hours. Coagulation profile No results for input(s): INR, PROTIME in the last 168 hours.  CBC: Recent Labs  Lab 05/13/21 0338 05/14/21 0153 05/18/21 0446  WBC 8.1 7.8 6.6  HGB 15.6 15.5 17.0  HCT 47.4 47.0 50.1  MCV 90.1 89.7 88.8  PLT 233 237 257   Cardiac Enzymes: No results for input(s): CKTOTAL, CKMB, CKMBINDEX, TROPONINI in the last 168 hours. BNP: Invalid input(s): POCBNP CBG: No results for input(s): GLUCAP in the last 168 hours. D-Dimer No results for input(s): DDIMER in the last 72 hours. Hgb A1c No results for input(s): HGBA1C in the last 72 hours. Lipid Profile No results for  input(s): CHOL, HDL, LDLCALC, TRIG, CHOLHDL, LDLDIRECT in the last 72 hours. Thyroid function studies No results for input(s): TSH, T4TOTAL, T3FREE, THYROIDAB in the last 72 hours.  Invalid input(s): FREET3 Anemia work up No results for input(s): VITAMINB12, FOLATE, FERRITIN, TIBC, IRON, RETICCTPCT in the last 72 hours. Microbiology Recent Results (from the past 240 hour(s))  Resp Panel by RT-PCR (Flu A&B, Covid) Nasopharyngeal Swab     Status: None   Collection Time: 05/11/21  9:56 AM   Specimen: Nasopharyngeal  Swab; Nasopharyngeal(NP) swabs in vial transport medium  Result Value Ref Range Status   SARS Coronavirus 2 by RT PCR NEGATIVE NEGATIVE Final    Comment: (NOTE) SARS-CoV-2 target nucleic acids are NOT DETECTED.  The SARS-CoV-2 RNA is generally detectable in upper respiratory specimens during the acute phase of infection. The lowest concentration of SARS-CoV-2 viral copies this assay can detect is 138 copies/mL. A negative result does not preclude SARS-Cov-2 infection and should not be used as the sole basis for treatment or other patient management decisions. A negative result may occur with  improper specimen collection/handling, submission of specimen other than nasopharyngeal swab, presence of viral mutation(s) within the areas targeted by this assay, and inadequate number of viral copies(<138 copies/mL). A negative result must be combined with clinical observations, patient history, and epidemiological information. The expected result is Negative.  Fact Sheet for Patients:  BloggerCourse.com  Fact Sheet for Healthcare Providers:  SeriousBroker.it  This test is no t yet approved or cleared by the [place] FDA and  has been authorized for detection and/or diagnosis of SARS-CoV-2 by FDA under an Emergency Use Authorization (EUA). This EUA will remain  in effect (meaning this test can be used) for the duration of the COVID-19 declaration under Section 564(b)(1) of the Act, 21 U.S.C.section 360bbb-3(b)(1), unless the authorization is terminated  or revoked sooner.       Influenza A by PCR NEGATIVE NEGATIVE Final   Influenza B by PCR NEGATIVE NEGATIVE Final    Comment: (NOTE) The Xpert Xpress SARS-CoV-2/FLU/RSV plus assay is intended as an aid in the diagnosis of influenza from Nasopharyngeal swab specimens and should not be used as a sole basis for treatment. Nasal washings and aspirates are unacceptable for Xpert Xpress  SARS-CoV-2/FLU/RSV testing.  Fact Sheet for Patients: BloggerCourse.com  Fact Sheet for Healthcare Providers: SeriousBroker.it  This test is not yet approved or cleared by the [place] FDA and has been authorized for detection and/or diagnosis of SARS-CoV-2 by FDA under an Emergency Use Authorization (EUA). This EUA will remain in effect (meaning this test can be used) for the duration of the COVID-19 declaration under Section 564(b)(1) of the Act, 21 U.S.C. section 360bbb-3(b)(1), unless the authorization is terminated or revoked.  Performed at [facility] [facility], [address], [place], [place] [address]   MRSA PCR Screening     Status: None   Collection Time: 05/11/21 11:05 AM   Specimen: Nasopharyngeal  Result Value Ref Range Status   MRSA by PCR NEGATIVE NEGATIVE Final    Comment:        The GeneXpert MRSA Assay (FDA approved for NASAL specimens only), is one component of a comprehensive MRSA colonization surveillance program. It is not intended to diagnose MRSA infection nor to guide or monitor treatment for MRSA infections. Performed at [facility], [address], [place], [place] [address]      Discharge Instructions:   Discharge Instructions     Diet - low sodium  heart healthy   Complete by: As directed    Dysphagia 2 diet. Meds crushed in puree; full supervision; check for pocketing on left   Discharge instructions   Complete by: As directed    Follow-up with your primary care provider in 1 week after discharge from rehabilitation.  Follow-up with [facility] in 3 to 4 weeks   Increase activity slowly   Complete by: As directed       Allergies as of 05/19/2021       Reactions   Sulfa Antibiotics Other (See Comments)   Nausea and eye swelling        Medication List     STOP taking these medications    mefloquine 250 MG tablet Commonly known as: LARIAM       TAKE these  medications    amLODipine 10 MG tablet Commonly known as: NORVASC Take 1 tablet (10 mg total) by mouth daily. Start taking on: May 20, 2021   atorvastatin 20 MG tablet Commonly known as: Lipitor Take 1 tablet (20 mg total) by mouth daily.   hydrochlorothiazide 12.5 MG capsule Commonly known as: MICROZIDE Take 1 capsule (12.5 mg total) by mouth daily. Start taking on: May 20, 2021   MENS 50+ MULTI VITAMIN/MIN PO Take 1 Dose by mouth in the morning and at bedtime.   pantoprazole 40 MG tablet Commonly known as: PROTONIX Take 1 tablet (40 mg total) by mouth daily. Start taking on: May 20, 2021   polyethylene glycol 17 g packet Commonly known as: MIRALAX / GLYCOLAX Take 17 g by mouth 2 (two) times daily.   senna-docusate 8.6-50 MG tablet Commonly known as: Senokot-S Take 1 tablet by mouth 2 (two) times daily.        Follow-up Information     [facility]. Schedule an appointment as soon as possible for a visit in 4 week(s).                   Time coordinating discharge: 39 minutes  Signed:  Kristien Salatino  [facility] 05/19/2021, 10:16 AM

## 2021-05-19 NOTE — Progress Notes (Signed)
Occupational Therapy Treatment Patient Details Name: Donald [name] MRN: [id number] DOB: [birth year] Today's Date: 05/19/2021    History of present illness 65 y.o. M who presents with right gaze and left hemiplegia. 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 Significant PMH: memory deficits, OSA.   OT comments  Patient with incremental progress toward patient focused OT goals.  Patient needing up to Max A and Mod verbal/tactile cues to maintain sit balance.  R arm placed in lap to decrease pushing to the L.  +2 and STEDY for sit to stand and transfers to recliner.  He is scheduled for admission to CIR today, and hopefully patient will progress with balance enough to increase independence with ADL completion.  OT will continue to follow in the acute setting to maximize his functional status.    Follow Up Recommendations  CIR    Equipment Recommendations  3 in 1 bedside commode    Recommendations for Other Services      Precautions / Restrictions Precautions Precautions: Fall Precaution Comments: L hemiparesis, L neglect Restrictions Weight Bearing Restrictions: No       Mobility Bed Mobility Overal bed mobility: Needs Assistance       Supine to sit: Mod assist;+2 for safety/equipment;HOB elevated     General bed mobility comments: assist to progress L leg off the bed, and scoot hips to EOB    Transfers Overall transfer level: Needs assistance   Transfers: Sit to/from Stand;Stand Pivot Transfers Sit to Stand: +2 safety/equipment Stand pivot transfers: +2 safety/equipment       General transfer comment: patient leaning heavily to the L    Balance Overall balance assessment: Needs assistance Sitting-balance support: Feet supported;Single extremity supported Sitting balance-Leahy Scale: Poor   Postural control: Posterior lean;Left lateral lean Standing balance support: Bilateral upper extremity supported Standing balance-Leahy Scale: Poor                              ADL either performed or assessed with clinical judgement   ADL       Grooming: Sitting;Cueing for sequencing;Minimal assistance                                       Vision   Visual Fields: Left visual field deficit   Perception     Praxis      Cognition  No changes noted from prior session                                                                Pertinent Vitals/ Pain       Pain Assessment: Faces Faces Pain Scale: No hurt Pain Intervention(s): Monitored during session                                                          Frequency  Min 2X/week        Progress Toward Goals  OT Goals(current goals can now be found  in the care plan section)  Progress towards OT goals: Progressing toward goals  Acute Rehab OT Goals Patient Stated Goal: none stated OT Goal Formulation: Patient unable to participate in goal setting Time For Goal Achievement: 05/26/21 Potential to Achieve Goals: Fair  Plan Discharge plan remains appropriate    Co-evaluation      Reason for Co-Treatment: For patient/therapist safety;Complexity of the patient's impairments (multi-system involvement)   OT goals addressed during session: ADL's and self-care      AM-PAC OT "6 Clicks" Daily Activity     Outcome Measure   Help from another person eating meals?: A Little Help from another person taking care of personal grooming?: A Lot Help from another person toileting, which includes using toliet, bedpan, or urinal?: A Lot Help from another person bathing (including washing, rinsing, drying)?: A Lot Help from another person to put on and taking off regular upper body clothing?: A Lot Help from another person to put on and taking off regular lower body clothing?: A Lot 6 Click Score: 13    End of Session Equipment Utilized During Treatment: Gait belt;Other (comment) (STEDY)  OT Visit  Diagnosis: Unsteadiness on feet (R26.81);Muscle weakness (generalized) (M62.81);Pain;Hemiplegia and hemiparesis;Other symptoms and signs involving cognitive function;Cognitive communication deficit (R41.841) Symptoms and signs involving cognitive functions: Nontraumatic SAH Hemiplegia - dominant/non-dominant: Non-Dominant Hemiplegia - caused by: Nontraumatic SAH Pain - Right/Left: Right   Activity Tolerance Patient tolerated treatment well   Patient Left in chair;with call bell/phone within reach;with chair alarm set;with family/visitor present   Nurse Communication Mobility status        Time: 1137-1205 OT Time Calculation (min): 28 min  Charges: OT General Charges $OT Visit: 1 Visit OT Treatments $Self Care/Home Management : 8-22 mins  05/19/2021  Rich, OTR/L  Acute Rehabilitation Services  Office:  [phone]    Suzanna Obey 05/19/2021, 12:31 PM

## 2021-05-20 ENCOUNTER — Other Ambulatory Visit: Payer: Self-pay

## 2021-05-20 ENCOUNTER — Encounter (HOSPITAL_COMMUNITY): Payer: Self-pay | Admitting: Physical Medicine & Rehabilitation

## 2021-05-20 ENCOUNTER — Inpatient Hospital Stay (HOSPITAL_COMMUNITY): Payer: No Typology Code available for payment source

## 2021-05-20 ENCOUNTER — Inpatient Hospital Stay (HOSPITAL_COMMUNITY)
Admission: RE | Admit: 2021-05-20 | Discharge: 2021-05-23 | DRG: 056 | Disposition: A | Discharge status: Other Acute Inpatient Hospital | Payer: No Typology Code available for payment source | Source: Intra-hospital | Attending: Physical Medicine & Rehabilitation | Admitting: Physical Medicine & Rehabilitation

## 2021-05-20 DIAGNOSIS — I69192 Facial weakness following nontraumatic intracerebral hemorrhage: Secondary | ICD-10-CM | POA: Diagnosis not present

## 2021-05-20 DIAGNOSIS — E440 Moderate protein-calorie malnutrition: Secondary | ICD-10-CM | POA: Diagnosis present

## 2021-05-20 DIAGNOSIS — R7303 Prediabetes: Secondary | ICD-10-CM | POA: Diagnosis present

## 2021-05-20 DIAGNOSIS — J189 Pneumonia, unspecified organism: Secondary | ICD-10-CM | POA: Diagnosis not present

## 2021-05-20 DIAGNOSIS — I612 Nontraumatic intracerebral hemorrhage in hemisphere, unspecified: Secondary | ICD-10-CM | POA: Diagnosis not present

## 2021-05-20 DIAGNOSIS — R569 Unspecified convulsions: Secondary | ICD-10-CM | POA: Diagnosis not present

## 2021-05-20 DIAGNOSIS — G4733 Obstructive sleep apnea (adult) (pediatric): Secondary | ICD-10-CM | POA: Diagnosis present

## 2021-05-20 DIAGNOSIS — G936 Cerebral edema: Secondary | ICD-10-CM | POA: Diagnosis not present

## 2021-05-20 DIAGNOSIS — Z9119 Patient's noncompliance with other medical treatment and regimen: Secondary | ICD-10-CM | POA: Diagnosis not present

## 2021-05-20 DIAGNOSIS — K5900 Constipation, unspecified: Secondary | ICD-10-CM | POA: Diagnosis present

## 2021-05-20 DIAGNOSIS — E876 Hypokalemia: Secondary | ICD-10-CM | POA: Diagnosis not present

## 2021-05-20 DIAGNOSIS — R4189 Other symptoms and signs involving cognitive functions and awareness: Secondary | ICD-10-CM | POA: Diagnosis present

## 2021-05-20 DIAGNOSIS — I69198 Other sequelae of nontraumatic intracerebral hemorrhage: Secondary | ICD-10-CM | POA: Diagnosis present

## 2021-05-20 DIAGNOSIS — E785 Hyperlipidemia, unspecified: Secondary | ICD-10-CM | POA: Diagnosis present

## 2021-05-20 DIAGNOSIS — R4701 Aphasia: Secondary | ICD-10-CM | POA: Diagnosis not present

## 2021-05-20 DIAGNOSIS — E871 Hypo-osmolality and hyponatremia: Secondary | ICD-10-CM | POA: Diagnosis present

## 2021-05-20 DIAGNOSIS — Z8249 Family history of ischemic heart disease and other diseases of the circulatory system: Secondary | ICD-10-CM

## 2021-05-20 DIAGNOSIS — I82411 Acute embolism and thrombosis of right femoral vein: Secondary | ICD-10-CM | POA: Diagnosis not present

## 2021-05-20 DIAGNOSIS — I10 Essential (primary) hypertension: Secondary | ICD-10-CM | POA: Diagnosis present

## 2021-05-20 DIAGNOSIS — I69154 Hemiplegia and hemiparesis following nontraumatic intracerebral hemorrhage affecting left non-dominant side: Secondary | ICD-10-CM | POA: Diagnosis not present

## 2021-05-20 DIAGNOSIS — R109 Unspecified abdominal pain: Secondary | ICD-10-CM

## 2021-05-20 DIAGNOSIS — I610 Nontraumatic intracerebral hemorrhage in hemisphere, subcortical: Secondary | ICD-10-CM | POA: Diagnosis not present

## 2021-05-20 DIAGNOSIS — I619 Nontraumatic intracerebral hemorrhage, unspecified: Secondary | ICD-10-CM | POA: Diagnosis present

## 2021-05-20 DIAGNOSIS — R739 Hyperglycemia, unspecified: Secondary | ICD-10-CM | POA: Diagnosis not present

## 2021-05-20 DIAGNOSIS — I618 Other nontraumatic intracerebral hemorrhage: Secondary | ICD-10-CM | POA: Diagnosis not present

## 2021-05-20 MED ORDER — ALUM & MAG HYDROXIDE-SIMETH 200-200-20 MG/5ML PO SUSP: 30.0000 mL | ORAL | Status: DC | PRN

## 2021-05-20 MED ORDER — PROCHLORPERAZINE MALEATE 5 MG PO TABS: 5.0000 mg | ORAL_TABLET | Freq: Four times a day (QID) | ORAL | Status: DC | PRN

## 2021-05-20 MED ORDER — GUAIFENESIN-DM 100-10 MG/5ML PO SYRP: 5.0000 mL | ORAL_SOLUTION | Freq: Four times a day (QID) | ORAL | Status: DC | PRN

## 2021-05-20 MED ORDER — FLEET ENEMA 7-19 GM/118ML RE ENEM: 1.0000 | ENEMA | Freq: Once | RECTAL | Status: DC

## 2021-05-20 MED ORDER — HEPARIN SODIUM (PORCINE) 5000 UNIT/ML IJ SOLN
5000.0000 [IU] | Freq: Three times a day (TID) | INTRAMUSCULAR | Status: DC
  Administered 2021-05-20 – 2021-05-23 (×8): 5000 [IU] via SUBCUTANEOUS
  Filled 2021-05-20 (×8): qty 1

## 2021-05-20 MED ORDER — BLOOD PRESSURE CONTROL BOOK
Freq: Once | Status: AC
  Filled 2021-05-20: qty 1

## 2021-05-20 MED ORDER — PROCHLORPERAZINE EDISYLATE 10 MG/2ML IJ SOLN: 5.0000 mg | Freq: Four times a day (QID) | INTRAMUSCULAR | Status: DC | PRN

## 2021-05-20 MED ORDER — FLEET ENEMA 7-19 GM/118ML RE ENEM: 1.0000 | ENEMA | Freq: Once | RECTAL | Status: DC | PRN

## 2021-05-20 MED ORDER — AMLODIPINE BESYLATE 10 MG PO TABS
10.0000 mg | ORAL_TABLET | Freq: Every day | ORAL | Status: DC
  Administered 2021-05-21 – 2021-05-22 (×2): 10 mg via ORAL
  Filled 2021-05-20 (×3): qty 1

## 2021-05-20 MED ORDER — PANTOPRAZOLE SODIUM 40 MG PO TBEC
40.0000 mg | DELAYED_RELEASE_TABLET | Freq: Every day | ORAL | Status: DC
  Administered 2021-05-21 – 2021-05-22 (×2): 40 mg via ORAL
  Filled 2021-05-20 (×3): qty 1

## 2021-05-20 MED ORDER — SENNOSIDES-DOCUSATE SODIUM 8.6-50 MG PO TABS
1.0000 | ORAL_TABLET | Freq: Two times a day (BID) | ORAL | Status: DC
  Administered 2021-05-20 – 2021-05-22 (×5): 1 via ORAL
  Filled 2021-05-20 (×6): qty 1

## 2021-05-20 MED ORDER — ACETAMINOPHEN 325 MG PO TABS: 325.0000 mg | ORAL_TABLET | ORAL | Status: DC | PRN

## 2021-05-20 MED ORDER — BISACODYL 10 MG RE SUPP
10.0000 mg | Freq: Every day | RECTAL | Status: DC | PRN
  Administered 2021-05-22: 10 mg via RECTAL
  Filled 2021-05-20: qty 1

## 2021-05-20 MED ORDER — HYDROCHLOROTHIAZIDE 12.5 MG PO CAPS
12.5000 mg | ORAL_CAPSULE | Freq: Every day | ORAL | Status: DC
  Administered 2021-05-21 – 2021-05-22 (×2): 12.5 mg via ORAL
  Filled 2021-05-20 (×3): qty 1

## 2021-05-20 MED ORDER — POLYETHYLENE GLYCOL 3350 17 G PO PACK: 17.0000 g | PACK | Freq: Every day | ORAL | Status: DC | PRN

## 2021-05-20 MED ORDER — TRAZODONE HCL 50 MG PO TABS: 25.0000 mg | ORAL_TABLET | Freq: Every evening | ORAL | Status: DC | PRN

## 2021-05-20 MED ORDER — PROCHLORPERAZINE 25 MG RE SUPP
12.5000 mg | Freq: Four times a day (QID) | RECTAL | Status: DC | PRN
Start: 2021-05-20 — End: 2021-05-23

## 2021-05-20 MED ORDER — POLYETHYLENE GLYCOL 3350 17 G PO PACK
17.0000 g | PACK | Freq: Two times a day (BID) | ORAL | Status: DC
  Administered 2021-05-20 – 2021-05-22 (×5): 17 g via ORAL
  Filled 2021-05-20 (×6): qty 1

## 2021-05-20 MED ORDER — ENSURE ENLIVE PO LIQD
237.0000 mL | Freq: Two times a day (BID) | ORAL | Status: DC
  Administered 2021-05-21: 237 mL via ORAL

## 2021-05-20 MED ORDER — DIPHENHYDRAMINE HCL 12.5 MG/5ML PO ELIX: 12.5000 mg | ORAL_SOLUTION | Freq: Four times a day (QID) | ORAL | Status: DC | PRN

## 2021-05-20 NOTE — Progress Notes (Signed)
Patient was seen and examined at bedside.  No interval complaints since yesterday.  Vitals reviewed.  Patient was scheduled for discharge to CIR yesterday but due to bed unavailability, was in our unit.  Patient is stable for transfer to CIR.  Please refer to discharge summary done on 05/19/2021.

## 2021-05-20 NOTE — Progress Notes (Signed)
Physical Therapy Treatment Patient Details Name: Donald [name] MRN: [id number] DOB: March 10, 1956 Today's Date: 05/20/2021    History of Present Illness 65 y.o. M who presents with right gaze and left hemiplegia. 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 Significant PMH: memory deficits, OSA.    PT Comments    Pt resting upon PT arrival, agreeable to session focused on sitting balance, attention to L, and seated tasks. Pt requiring max +1 assist for moving to/from EOB, and min-mod assist to maintain upright balance at EOB. Pt with continued preference for L posterolateral leaning, tactile cuing more successful at adjusting pt posture to midline vs external visual cuing. Pt tolerated 10+ minutes of seated tasks, including truncal rotational tasks, lateral elbow propping, and scooting at EOB. Pt's wife present and very supportive, CIR remains appropriate dispo at this time.    Follow Up Recommendations  CIR     Equipment Recommendations  3in1 (PT);Wheelchair (measurements PT);Wheelchair cushion (measurements PT);Hospital bed    Recommendations for Other Services       Precautions / Restrictions Precautions Precautions: Fall Precaution Comments: L hemiparesis, L inattention Restrictions Weight Bearing Restrictions: No    Mobility  Bed Mobility Overal bed mobility: Needs Assistance Bed Mobility: Supine to Sit;Sit to Supine     Supine to sit: Max assist Sit to supine: Max assist   General bed mobility comments: max assist for trunk and LE (L>R) management, scooting to/from EOB, and boost up in bed upon return to supine. Pt initating movement to L EOB.    Transfers Overall transfer level: Needs assistance   Transfers: Lateral/Scoot Transfers          Lateral/Scoot Transfers: Max assist General transfer comment: max assist for scoot towards L x2 for truncal boost, LLE blocking  Ambulation/Gait                 Stairs             Wheelchair  Mobility    Modified Rankin (Stroke Patients Only) Modified Rankin (Stroke Patients Only) Pre-Morbid Rankin Score: No symptoms Modified Rankin: Severe disability     Balance Overall balance assessment: Needs assistance Sitting-balance support: Feet supported;Single extremity supported Sitting balance-Leahy Scale: Poor Sitting balance - Comments: L posterolateral lean preference, pt able to hold self up with min guard when given R railing to hold onto. intermittent min-mod assist to right baalnce, poor awareness of midline Postural control: Left lateral lean;Posterior lean                                  Cognition Arousal/Alertness: Awake/alert Behavior During Therapy: WFL for tasks assessed/performed   Area of Impairment: Awareness;Problem solving;Safety/judgement;Attention;Following commands                 Orientation Level: Disoriented to;Situation;Place Current Attention Level: Sustained Memory: Decreased short-term memory Following Commands: Follows one step commands with increased time Safety/Judgement: Decreased awareness of safety;Decreased awareness of deficits Awareness: Intellectual Problem Solving: Requires verbal cues;Requires tactile cues;Slow processing;Decreased initiation;Difficulty sequencing General Comments: Pt states he is in the hospital, when asked pt where he is going later pt unable to state, so given option "are you going to [employer]" and Pt states "yes". Pt with periodic eye closing during session, requires cues to stay attentive and focused. Inattentive to L without max cuing.      Exercises Other Exercises Other Exercises: Seated tasks: truncal rotation to L,  with RUE reaching, x5; visual tracking wife to L repeatedly; bilat elbow propping with return to midline, PT holding humeral head on L to ensure good LUE positioning    General Comments        Pertinent Vitals/Pain Pain Assessment: Faces Faces Pain Scale: No  hurt Pain Intervention(s): Monitored during session    Home Living                      Prior Function            PT Goals (current goals can now be found in the care plan section) Acute Rehab PT Goals Patient Stated Goal: none stated PT Goal Formulation: With patient/family Time For Goal Achievement: 05/26/21 Potential to Achieve Goals: Good Progress towards PT goals: Progressing toward goals    Frequency    Min 4X/week      PT Plan Current plan remains appropriate    Co-evaluation              AM-PAC PT "6 Clicks" Mobility   Outcome Measure  Help needed turning from your back to your side while in a flat bed without using bedrails?: A Lot Help needed moving from lying on your back to sitting on the side of a flat bed without using bedrails?: Total Help needed moving to and from a bed to a chair (including a wheelchair)?: Total Help needed standing up from a chair using your arms (e.g., wheelchair or bedside chair)?: Total Help needed to walk in hospital room?: Total Help needed climbing 3-5 steps with a railing? : Total 6 Click Score: 7    End of Session Equipment Utilized During Treatment: Gait belt Activity Tolerance: Patient tolerated treatment well Patient left: with call bell/phone within reach;with family/visitor present;in bed Nurse Communication: Mobility status;Need for lift equipment PT Visit Diagnosis: Muscle weakness (generalized) (M62.81);Hemiplegia and hemiparesis;Other abnormalities of gait and mobility (R26.89) Hemiplegia - Right/Left: Left Hemiplegia - dominant/non-dominant: Non-dominant Hemiplegia - caused by: Nontraumatic intracerebral hemorrhage     Time: 4081-4481 PT Time Calculation (min) (ACUTE ONLY): 27 min  Charges:  $Therapeutic Activity: 8-22 mins $Neuromuscular Re-education: 8-22 mins                     Marye Round, PT DPT Acute Rehabilitation Services Pager [phone]  Office [phone]    Donald [name] E  Stroup 05/20/2021, 1:01 PM

## 2021-05-20 NOTE — Progress Notes (Signed)
Patient admitted to IP rehab this afternoon at 1600 with wife present. No complaints of pain at this time, call light and personal belongings within reach.

## 2021-05-20 NOTE — Progress Notes (Addendum)
Inpatient Rehab Admissions Coordinator:   Admission held yesterday due to discharge from CIR cancelled after 8 pm.  We do have a bed for this patient today and will plan to admit.  I'll let pt/family and TOC know.  Please see PAS from 05/19/21.  Estill Dooms, PT, DPT Admissions Coordinator [phone] 05/20/21  10:05 AM

## 2021-05-20 NOTE — H&P (Signed)
Physical Medicine and Rehabilitation Admission H&P          Chief Complaint  Patient presents with   ICH with functional deficits      HPI: Donald [name] right handed male with history of declining memory and  personality changes which were present after fall in 2020 and in process of being referred to do for dementia work-up.  He has otherwise been in good health.  He was admitted on 05/11/21 with acute left hemiparesis, sensory deficits, right gaze preference and left facial droop.  UDS was negative.  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  CTA head/neck was negative for LVO and showed narrowing of superior sagittal sinus with question of mass-effect versus intraluminal thrombus.  Follow-up MRI/MRA brain showed no significant change in bleed with surrounding edema and mild mass-effect and chronic hemosiderin in right parietal lobe indicating site of prior hemorrhage.  He was started on Cleviprex for BP control and Dr. Roda ShuttersXu question CAA.   Echocardiogram showed EF of 65 to 70% with no wall abnormality.  MRV brain done showing no evidence of intracranial venous thrombosis and mild vascular enhancement felt to be reactive in nature.  Neurology recommends repeat CTA head/MRI brain on outpatient basis hematoma resolves to rule out underlying source of bleed.  Patient continues to be limited by left facial droop with right gaze preference, left hemiplegia as well as cognitive deficits.  He was placed on modified diet of dysphagia to thin liquids.  Therapy ongoing and patient able to sit edge of bed for 8 minutes is working on standing with left knee supported.  CIR recommended due to functional decline.     Review of Systems Constitutional: Positive for weight loss (in the past year). Negative for chills and fever. HENT: Negative for hearing loss and sinus pain.   Eyes: Negative for blurred vision and double  vision. Respiratory: Negative for cough and shortness of breath.   Cardiovascular: Negative for chest pain and palpitations. Gastrointestinal: Positive for abdominal pain and constipation. Negative for heartburn and nausea. Genitourinary: Negative for dysuria and urgency. Musculoskeletal: Negative for myalgias. Skin: Negative for rash. Neurological: Positive for speech change and weakness. Negative for dizziness. Psychiatric/Behavioral: Positive for memory loss.               Past Medical History:  Diagnosis Date   Arthritis                 Past Surgical History:  Procedure Laterality Date   APPENDECTOMY                   Family History  Problem Relation Age of Onset   Hypertension Mother     Colon polyps Neg Hx     Colon cancer Neg Hx     Esophageal cancer Neg Hx     Rectal cancer Neg Hx     Stomach cancer Neg Hx        Social History:  Married. From [place]--has been in [place] since 1986. 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 Wife reports that he has never smoked. He has never used smokeless tobacco. He reports that he does not drink alcohol--occasional glass of wine. He does not use drugs.               Allergies  Allergen Reactions   Sulfa Antibiotics Other (See  Comments)      Nausea and eye swelling                   Medications Prior to Admission  Medication Sig Dispense Refill   Multiple Vitamins-Minerals (MENS 50+ MULTI VITAMIN/MIN PO) Take 1 Dose by mouth in the morning and at bedtime.       mefloquine (LARIAM) 250 MG tablet Take 1 tablet (250 mg total) by mouth every 7 (seven) days. (Patient not taking: No sig reported) 16 tablet 0      Drug Regimen Review  Drug regimen was reviewed and remains appropriate with no significant issues identified   Home: Home Living Family/patient expects to be discharged to:: Private residence Living Arrangements: Spouse/significant other,Other  (Comment) (parents) Available Help at Discharge: Family,Available 24 hours/day Type of Home: House Home Access: Stairs to enter [employer] of Steps: 4 Entrance Stairs-Rails: Left Home Layout: One level Bathroom Shower/Tub: Health visitor: Standard Home Equipment: Shower seat - built in,Grab bars - toilet,Grab bars - tub/shower Additional Comments: has access to RW from pt's MIL  Lives With: Spouse   Functional History: Prior Function Level of Independence: Independent Comments: part-time Education officer, 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   Functional Status:  Mobility: Bed Mobility Overal bed mobility: Needs Assistance Bed Mobility: Supine to Sit Rolling: Min assist Sidelying to sit: Mod assist Supine to sit: Mod assist,+2 for physical assistance,Max assist,HOB elevated Sit to supine: Mod assist,+2 for physical assistance General bed mobility comments: assist to progress L leg off the bed, and scoot hips to EOB. Heavy posterior/left leaning with sitting Transfers Overall transfer level: Needs assistance Equipment used: Ambulation equipment used Transfer via Lift Equipment: Stedy Transfers: Sit to/from Stand Sit to Stand: +2 physical assistance,Max assist Stand pivot transfers: +2 safety/equipment General transfer comment: Patient leans heavily to the left requiring mod to max assist to remain upright. Pushes with right UE Ambulation/Gait General Gait Details: unable   ADL: ADL Overall ADL's : Needs assistance/impaired Eating/Feeding: NPO Grooming: Sitting,Cueing for sequencing,Minimal assistance Grooming Details (indicate cue type and reason): Pt requiring Min-Mod A for sitting balance in chair while performing oral care. Pt requiring mod A to incorporate LUE into task. Max cues throughout for sequencing and visual scanning to L Upper Body Bathing: Moderate assistance Lower Body Bathing: Maximal  assistance,Sitting/lateral leans,Sit to/from stand,Bed level Upper Body Dressing : Moderate assistance,Sitting,Standing,Bed level Lower Body Dressing: Maximal assistance,Sitting/lateral leans,Sit to/from stand,Bed level,Cueing for safety Toilet Transfer: Maximal assistance,+2 for physical assistance,+2 for safety/equipment,BSC Toilet Transfer Details (indicate cue type and reason): simulated for sit to stand, but requires near totalA to move feet Toileting- Clothing Manipulation and Hygiene: Maximal assistance,+2 for physical assistance,Sitting/lateral lean,Sit to/from stand Functional mobility during ADLs: Maximal assistance,+2 for physical assistance,+2 for safety/equipment,Rolling walker,Cueing for safety (multiple sit to stands with PT; L knee blocked) General ADL Comments: Focused session on sitting balance, visual scanning, cognition, and incorporation of LUE into task.   Cognition: Cognition Overall Cognitive Status: Impaired/Different from baseline Arousal/Alertness: Awake/alert Orientation Level: Oriented X4 Attention: Sustained Sustained Attention: Impaired Sustained Attention Impairment: Verbal basic,Functional basic Memory:  (hx of impairments- will assess further) Awareness: Impaired Awareness Impairment: Intellectual impairment,Emergent impairment,Anticipatory impairment Problem Solving: Impaired Problem Solving Impairment: Verbal basic,Functional basic Safety/Judgment: Impaired Cognition Arousal/Alertness: Awake/alert Behavior During Therapy: WFL for tasks assessed/performed Overall Cognitive Status: Impaired/Different from baseline Area of Impairment: Awareness,Problem solving,Safety/judgement Orientation Level: Disoriented to,Situation Current Attention Level: Sustained Memory: Decreased short-term memory Following Commands:  Follows one step commands with increased time Safety/Judgement: Decreased awareness of safety,Decreased awareness of deficits Awareness:  Intellectual Problem Solving: Requires verbal cues,Requires tactile cues,Slow processing,Decreased initiation,Difficulty sequencing General Comments: pt maintains poor awareness of deficits particularly with left inattention and neglect. Mod cues throughout for attention and balance     Blood pressure 121/77, pulse 84, temperature 98 F (36.7 C), temperature source Oral, resp. rate 18, weight 69.6 kg, SpO2 100 %. Physical Exam Constitutional: No distress, asleep . Vital signs reviewed. HEENT: EOMI, oral membranes moist Neck: supple Cardiovascular: RRR without murmur. No JVD    Respiratory/Chest: CTA Bilaterally without wheezes or rales. Normal effort    GI/Abdomen: BS +, non-tender, non-distended Ext: no clubbing, cyanosis, or edema Psych: somnolent Neurological:    Mental Status: He is alert.     Comments: Left facial droop with minimal dysarthria.  Sedated. Aroused to verbal stimuli. Dense left hemiparesis 0/5 LUE and LLE. Early tone LLE. DTR's 3+ LLE 2+ LUE. Moved right side spontaneously. Withdraws to pain on right side, minimal withdrawal on left.          Lab Results Last 48 Hours             Results for orders placed or performed during the hospital encounter of 05/11/21 (from the past 48 hour(s))  Basic metabolic panel     Status: Abnormal    Collection Time: 05/18/21  4:46 AM  Result Value Ref Range    Sodium 134 (L) 135 - 145 mmol/L    Potassium 4.6 3.5 - 5.1 mmol/L    Chloride 95 (L) 98 - 111 mmol/L    CO2 28 22 - 32 mmol/L    Glucose, Bld 116 (H) 70 - 99 mg/dL      Comment: Glucose reference range applies only to samples taken after fasting for at least 8 hours.    BUN 22 8 - 23 mg/dL    Creatinine, Ser 8.84 0.61 - 1.24 mg/dL    Calcium 16.6 8.9 - 06.3 mg/dL    GFR, Estimated >01 >60 mL/min      Comment: (NOTE) Calculated using the CKD-EPI Creatinine Equation (2021)      Anion gap 11 5 - 15      Comment: Performed at [facility], [address]  [place], [place] [address]  Magnesium     Status: Abnormal    Collection Time: 05/18/21  4:46 AM  Result Value Ref Range    Magnesium 2.7 (H) 1.7 - 2.4 mg/dL      Comment: Performed at [facility], [address], [place], [place] [address]  CBC     Status: None    Collection Time: 05/18/21  4:46 AM  Result Value Ref Range    WBC 6.6 4.0 - 10.5 K/uL    RBC 5.64 4.22 - 5.81 MIL/uL    Hemoglobin 17.0 13.0 - 17.0 g/dL    HCT 22.0 25.4 - 27.0 %    MCV 88.8 80.0 - 100.0 fL    MCH 30.1 26.0 - 34.0 pg    MCHC 33.9 30.0 - 36.0 g/dL    RDW 62.3 76.2 - 83.1 %    Platelets 257 150 - 400 K/uL    nRBC 0.0 0.0 - 0.2 %      Comment: Performed at [facility], [address], [place], [place] [address]      Imaging Results (Last 48 hours)  No results found.  Medical Problem List and Plan: 1.  Decline in Mobilit yand ADL secondary to RIght frontal IPH d/t HTN             -patient may  shower, PRAFO for LLE             -ELOS/Goals: 20-22d, min assist PT,OT and supervision to min assist with SLP     2.  Antithrombotics: -DVT/anticoagulation:  Pharmaceutical: Heparin             -antiplatelet therapy: N/A due to bleed  3. Pain Management: Tylenol prn 4. Mood: LCSW to follow for evaluation and support.              -antipsychotic agents: N/a 5. Neuropsych: This patient is not capable of making decisions on his own behalf. 6. Skin/Wound Care: Routine pressure relief measures.  7. Fluids/Electrolytes/Nutrition: Monitor I/O. Check lytes in am. 8. OSA: Was not compliant with CPAP 9. Hyponatremia: Will recheck lytes in am. 10. HTN: Monitor BP tid--continue Microzide daily. 11. Prediabetes: Hgb A1c-5.8. Blood sugars have been reasonable in 100-120 range. Continue Carb Modified restrictions. 12. Dyslipidemia: Statin held due to bleed--resume at discharge. 13. Constipation: had large bm 6/8           Jacquelynn Cree, PA-C 05/19/2021  I have personally performed a face to  face diagnostic evaluation of this patient and formulated the key components of the plan.  Additionally, I have personally reviewed laboratory data, imaging studies, as well as relevant notes and concur with the physician assistant's documentation above.  The patient's status has not changed from the original H&P.  Any changes in documentation from the acute care chart have been noted above.  Ranelle Oyster, MD, [place]

## 2021-05-21 DIAGNOSIS — I618 Other nontraumatic intracerebral hemorrhage: Secondary | ICD-10-CM

## 2021-05-21 LAB — COMPREHENSIVE METABOLIC PANEL
ALT: 25 U/L (ref 0–44)
AST: 27 U/L (ref 15–41)
Albumin: 4 g/dL (ref 3.5–5.0)
Alkaline Phosphatase: 100 U/L (ref 38–126)
Anion gap: 10 (ref 5–15)
BUN: 20 mg/dL (ref 8–23)
CO2: 29 mmol/L (ref 22–32)
Calcium: 10 mg/dL (ref 8.9–10.3)
Chloride: 95 mmol/L — ABNORMAL LOW (ref 98–111)
Creatinine, Ser: 1.2 mg/dL (ref 0.61–1.24)
GFR, Estimated: >60 mL/min (ref >60)
Glucose, Bld: 133 mg/dL — ABNORMAL HIGH (ref 70–99)
Potassium: 4.3 mmol/L (ref 3.5–5.1)
Sodium: 134 mmol/L — ABNORMAL LOW (ref 135–145)
Total Bilirubin: 0.9 mg/dL (ref 0.3–1.2)
Total Protein: 7.7 g/dL (ref 6.5–8.1)

## 2021-05-21 LAB — CBC WITH DIFFERENTIAL/PLATELET
Abs Immature Granulocytes: 0.03 10*3/uL (ref 0.00–0.07)
Basophils Absolute: 0 10*3/uL (ref 0.0–0.1)
Basophils Relative: 0 %
Eosinophils Absolute: 0 10*3/uL (ref 0.0–0.5)
Eosinophils Relative: 0 %
HCT: 47.5 % (ref 39.0–52.0)
Hemoglobin: 16.2 g/dL (ref 13.0–17.0)
Immature Granulocytes: 0 %
Lymphocytes Relative: 24 %
Lymphs Abs: 2.1 10*3/uL (ref 0.7–4.0)
MCH: 30.1 pg (ref 26.0–34.0)
MCHC: 34.1 g/dL (ref 30.0–36.0)
MCV: 88.3 fL (ref 80.0–100.0)
Monocytes Absolute: 1.1 10*3/uL — ABNORMAL HIGH (ref 0.1–1.0)
Monocytes Relative: 13 %
Neutro Abs: 5.4 10*3/uL (ref 1.7–7.7)
Neutrophils Relative %: 63 %
Platelets: 292 10*3/uL (ref 150–400)
RBC: 5.38 MIL/uL (ref 4.22–5.81)
RDW: 12.6 % (ref 11.5–15.5)
WBC: 8.7 10*3/uL (ref 4.0–10.5)
nRBC: 0 % (ref 0.0–0.2)

## 2021-05-21 MED ORDER — ENSURE ENLIVE PO LIQD
237.0000 mL | Freq: Three times a day (TID) | ORAL | Status: DC
  Administered 2021-05-21 – 2021-05-22 (×4): 237 mL via ORAL

## 2021-05-21 MED ORDER — ADULT MULTIVITAMIN W/MINERALS CH
1.0000 | ORAL_TABLET | Freq: Every day | ORAL | Status: DC
  Administered 2021-05-22: 1 via ORAL
  Filled 2021-05-21 (×2): qty 1

## 2021-05-21 NOTE — Progress Notes (Signed)
Orthopedic Tech Progress Note Patient Details:  Donald [name] [birth year] [id number] Called order into [facility] Patient ID: Donald [name], male   DOB: 11-Apr-1956, 65 y.o.   MRN: [id number]  Donald [name] 05/21/2021, 10:55 AM

## 2021-05-21 NOTE — Progress Notes (Signed)
Inpatient Rehabilitation Center Individual Statement of Services  Patient Name:  Donald [name]  Date:  05/21/2021  Welcome to the Inpatient Rehabilitation Center.  Our goal is to provide you with an individualized program based on your diagnosis and situation, designed to meet your specific needs.  With this comprehensive rehabilitation program, you will be expected to participate in at least 3 hours of rehabilitation therapies Monday-Friday, with modified therapy programming on the weekends.  Your rehabilitation program will include the following services:  Physical Therapy (PT), Occupational Therapy (OT), Speech Therapy (ST), 24 hour per day rehabilitation nursing, Therapeutic Recreaction (TR), Neuropsychology, Care Coordinator, Rehabilitation Medicine, Nutrition Services, and Pharmacy Services  Weekly team conferences will be held on Wednesdays to discuss your progress.  Your Inpatient Rehabilitation Care Coordinator will talk with you frequently to get your input and to update you on team discussions.  Team conferences with you and your family in attendance may also be held.  Expected length of stay: 27-30 Days  Overall anticipated outcome: Supervision to Min A  Depending on your progress and recovery, your program may change. Your Inpatient Rehabilitation Care Coordinator will coordinate services and will keep you informed of any changes. Your Inpatient Rehabilitation Care Coordinator's name and contact numbers are listed  below.  The following services may also be recommended but are not provided by the Inpatient Rehabilitation Center:   Home Health Rehabiltiation Services Outpatient Rehabilitation Services    Arrangements will be made to provide these services after discharge if needed.  Arrangements include referral to agencies that provide these services.  Your insurance has been verified to be:  [facility]       Your primary doctor is:  Meredith Staggers, MD  Pertinent information will  be shared with your doctor and your insurance company.  Inpatient Rehabilitation Care Coordinator:  Lavera Guise, [place] [phone] or [phone]  Information discussed with and copy given to patient by: Andria Rhein, 05/21/2021, 11:26 AM

## 2021-05-21 NOTE — Progress Notes (Signed)
Initial Nutrition Assessment  DOCUMENTATION CODES:   Non-severe (moderate) malnutrition in context of chronic illness  INTERVENTION:   - Increase Ensure Enlive po to TID, each supplement provides 350 kcal and 20 grams of protein  - Magic Cup BID with meals, each supplement provides 290 kcal and 9 grams of protein  - Encourage adequate PO intake  - MVI with minerals daily  NUTRITION DIAGNOSIS:   Moderate Malnutrition related to chronic illness (CHF) as evidenced by mild fat depletion, moderate muscle depletion.  GOAL:   Patient will meet greater than or equal to 90% of their needs  MONITOR:   PO intake, Supplement acceptance, Labs, Weight trends, I & O's  REASON FOR ASSESSMENT:   Malnutrition Screening Tool    ASSESSMENT:   65 yo male with a PMH of declining memory and personality changes that were present after a fall in 2020 and in process of a dementia workup, HFrEF 65-70%, and arthritis who presents with ICH.  Pt on a dysphagia 2 diet with thin liquids. No meal completions documented at this time.  Spoke with pt at bedside. Pt is a poor historian. He states that he ate well at lunch but unable to recall what he had to eat or how much of his meal was consumed. Pt states that he drank a full Ensure supplement this morning. He states that he tolerates these well.  Pt thinks that he has lost some weight recently but unsure how much. He is unable to tell me what his UBW is.  Reviewed weight history in chart. Pt with an 8.8 kg weight loss since 06/19/20. This is an 11.8% weight loss in 11 months which is not significant for timeframe but is concerning given progressive nature. RD to increase Ensure supplements from BID to TID. Magic cups automatically sent on lunch and dinner trays for pt's current diet.  Discussed importance of adequate PO intake with pt at bedside who expressed understanding. Suspect pt will require encouragement with meals.  Medications reviewed and  include: Ensure Enlive BID, protonix, miralax, senna  Labs reviewed: sodium 134  NUTRITION - FOCUSED PHYSICAL EXAM:  Flowsheet Row Most Recent Value  Orbital Region Mild depletion  Upper Arm Region Moderate depletion  Thoracic and Lumbar Region Mild depletion  Buccal Region Mild depletion  Temple Region Moderate depletion  Clavicle Bone Region Moderate depletion  Clavicle and Acromion Bone Region Moderate depletion  Scapular Bone Region Mild depletion  Dorsal Hand Mild depletion  Patellar Region Mild depletion  Anterior Thigh Region Mild depletion  Posterior Calf Region Mild depletion  Edema (RD Assessment) None  Hair Reviewed  Eyes Reviewed  Mouth Reviewed  Skin Reviewed  Nails Reviewed       Diet Order:   Diet Order             DIET DYS 2 Room service appropriate? Yes; Fluid consistency: Thin  Diet effective now                   EDUCATION NEEDS:   Education needs have been addressed  Skin:  Skin Assessment: Reviewed RN Assessment  Last BM:  05/19/21  Height:   Ht Readings from Last 1 Encounters:  05/20/21 5\' 5"  (1.651 m)    Weight:   Wt Readings from Last 1 Encounters:  05/20/21 66 kg    Ideal Body Weight:  61.8 kg  BMI:  Body mass index is 24.21 kg/m.  Estimated Nutritional Needs:   Kcal:  1750-1950  Protein:  85-100 grams  Fluid:  1.7-1.9 L    Mertie Clause, MS, RD, LDN Inpatient Clinical Dietitian Please see [facility] for contact information.

## 2021-05-21 NOTE — IPOC Note (Addendum)
Overall Plan of Care [facility]) Patient Details Name: Donald [name] MRN: [id number] DOB: Dec 20, 1955  Admitting Diagnosis: ICH (intracerebral hemorrhage) [facility])  Hospital Problems: Principal Problem:   ICH (intracerebral hemorrhage) (HCC) Active Problems:   Malnutrition of moderate degree     Functional Problem List: Nursing Bladder, Bowel, Safety, Medication Management, Nutrition, Endurance  PT Balance, Behavior, Endurance, Edema, Motor, Nutrition, Pain, Perception, Safety, Sensory, Skin Integrity  OT Balance, Endurance, Perception, Vision, Motor, Safety, Sensory, Cognition, Skin Integrity  SLP Behavior, Cognition, Perception, Safety, Linguistic, Nutrition  TR         Basic ADL's: OT Eating, Grooming, Bathing, Dressing, Toileting     Advanced  ADL's: OT       Transfers: PT Bed Mobility, Bed to Chair, Car, [employer], Dietitian, Technical brewer: PT Ambulation, Psychologist, prison and probation services, Stairs     Additional Impairments: OT Fuctional Use of Upper Extremity  SLP Swallowing, Communication, Social Cognition expression Social Interaction, Problem Solving, Memory, Attention, Awareness  TR      Anticipated Outcomes Item Anticipated Outcome  Self Feeding S  Swallowing  supervisionA   Basic self-care  mod A  Toileting  mod A   Bathroom Transfers mod A  Bowel/Bladder  manage bowel with mod I assist and bladder with min assist  Transfers  Min assist with Slide Board to and from [facility]  Locomotion  Supervision assist WC level. Mod-max assist ambulatory with therapy.  Communication  supervisionA basic wants/needs/thoughts, supervisionA comprehensionq  Cognition  supervision to minA basic problem solving, attention, awareness  Pain  n/a  Safety/Judgment  maintain safety with cues/reminders   Therapy Plan: PT Intensity: Minimum of 1-2 x/day ,45 to 90 minutes PT Frequency: 5 out of 7 days PT Duration Estimated Length of Stay: 26-30 days OT Intensity: Minimum  of 1-2 x/day, 45 to 90 minutes OT Frequency: 5 out of 7 days OT Duration/Estimated Length of Stay: 3.5 to 4 weeks SLP Intensity: Minumum of 1-2 x/day, 30 to 90 minutes SLP Frequency: 3 to 5 out of 7 days SLP Duration/Estimated Length of Stay: 26-30 days   Due to the current state of emergency, patients may not be receiving their 3-hours of Medicare-mandated therapy.   Team Interventions: Nursing Interventions Bladder Management, Disease Management/Prevention, Medication Management, Discharge Planning, Bowel Management, Patient/Family Education, Dysphagia/Aspiration Precaution Training  PT interventions Ambulation/gait training, Warden/ranger, Cognitive remediation/compensation, DME/adaptive equipment instruction, Discharge planning, Community reintegration, Disease management/prevention, Functional electrical stimulation, Functional mobility training, Neuromuscular re-education, Pain management, Patient/family education, Psychosocial support, Skin care/wound management, Splinting/orthotics, Stair training, Therapeutic Activities, Therapeutic Exercise, Wheelchair propulsion/positioning, UE/LE Coordination activities, UE/LE Strength taining/ROM, Visual/perceptual remediation/compensation  OT Interventions Warden/ranger, Cognitive remediation/compensation, Community reintegration, Discharge planning, Disease mangement/prevention, DME/adaptive equipment instruction, Functional electrical stimulation, Functional mobility training, Neuromuscular re-education, Pain management, Patient/family education, Psychosocial support, Self Care/advanced ADL retraining, Skin care/wound managment, Splinting/orthotics, Therapeutic Activities, Therapeutic Exercise, UE/LE Strength taining/ROM, UE/LE Coordination activities, Visual/perceptual remediation/compensation, Wheelchair propulsion/positioning  SLP Interventions Cognitive remediation/compensation, Dysphagia/aspiration precaution training,  Internal/external aids, Speech/Language facilitation, Environmental controls, Cueing hierarchy, Functional tasks, Patient/family education  TR Interventions    SW/CM Interventions Discharge Planning, Psychosocial Support, Patient/Family Education, Disease Management/Prevention   Barriers to Discharge MD  Medical stability  Nursing Decreased caregiver support, Incontinence, Home environment access/layout 1  level 4 ste left rail with spouse  PT Inaccessible home environment, Decreased caregiver support, Home environment access/layout, Incontinence, Lack of/limited family support, Insurance for SNF coverage, Medication compliance, Behavior, Nutrition means    OT  SLP Other (comments) patient will require 24 hour supervision and assistance upon discharge  SW       Team Discharge Planning: Destination: PT-Skilled Nursing Facility (SNF) ,OT- Home , SLP-Home Projected Follow-up: PT-Home health PT, Skilled nursing facility, OT-  Home health OT, Skilled nursing facility, SLP-Home Health SLP, 24 hour supervision/assistance Projected Equipment Needs: PT-To be determined, Wheelchair cushion (measurements), Wheelchair (measurements), OT- 3 in 1 bedside comode, To be determined, SLP-None recommended by SLP Equipment Details: PT- , OT-  Patient/family involved in discharge planning: PT- Patient,  OT-Patient, SLP-Patient unable/family or caregive not available  MD ELOS: 21-25d Medical Rehab Prognosis:  Fair Assessment:  admitted on 05/11/21 with acute left hemiparesis, sensory deficits, right gaze preference and left facial droop.  UDS was negative.  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  CTA head/neck was negative for LVO and showed narrowing of superior sagittal sinus with question of mass-effect versus intraluminal thrombus.  Follow-up MRI/MRA brain showed no significant change in bleed with surrounding edema and mild  mass-effect and chronic hemosiderin in right parietal lobe indicating site of prior hemorrhage.  He was started on Cleviprex for BP control and Dr. Roda Shutters question CAA.   Echocardiogram showed EF of 65 to 70% with no wall abnormality.  MRV brain done showing no evidence of intracranial venous thrombosis and mild vascular enhancement felt to be reactive in nature.  Neurology recommends repeat CTA head/MRI brain on outpatient basis hematoma resolves to rule out underlying source of bleed.  Patient continues to be limited by left facial droop with right gaze preference, left hemiplegia as well as cognitive deficits.  He was placed on modified diet of dysphagia to thin liquids.  Therapy ongoing and patient able to sit edge of bed for 8 minutes is working on standing with left knee supported.  CIR recommended due to functional decline.  Now requiring 24/7 Rehab RN,MD, as well as CIR level PT, OT and SLP.  Treatment team will focus on ADLs and mobility with goals set at [facility]   See Team Conference Notes for weekly updates to the plan of care

## 2021-05-21 NOTE — Progress Notes (Signed)
Inpatient Rehabilitation  Patient information reviewed and entered into eRehab system by Lux Skilton Jeryl Wilbourn, OTR/L.   Information including medical coding, functional ability and quality indicators will be reviewed and updated through discharge.    

## 2021-05-21 NOTE — Evaluation (Signed)
Speech Language Pathology Assessment and Plan  Patient Details  Name: Donald [name] MRN: [id number] Date of Birth: May 08, 1956  SLP Diagnosis: Dysphagia;Cognitive Impairments;Speech and Language deficits  Rehab Potential: Good ELOS: 26-30 days    Today's Date: 05/21/2021 SLP Individual Time: 1315-1400 SLP Individual Time Calculation (min): 45 min   Hospital Problem: Principal Problem:   ICH (intracerebral hemorrhage) ([place])  Past Medical History:  Past Medical History:  Diagnosis Date   Arthritis    Past Surgical History:  Past Surgical History:  Procedure Laterality Date   APPENDECTOMY      Assessment / Plan / Recommendation  Patient is a 65 y.o. year old right handed male with history of declining memory and  personality changes which were present after fall in 2020 and in process of being referred to do for dementia work-up.  He has otherwise been in good health.  He was admitted on 05/11/21 with acute left hemiparesis, sensory deficits, right gaze preference and left facial droop.  UDS was negative.  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  CTA head/neck was negative for LVO and showed narrowing of superior sagittal sinus with question of mass-effect versus intraluminal thrombus.  Follow-up MRI/MRA brain showed no significant change in bleed with surrounding edema and mild mass-effect and chronic hemosiderin in right parietal lobe indicating site of prior hemorrhage.  He was started on Cleviprex for BP control and Dr. Erlinda Hong question CAA.   Echocardiogram showed EF of 65 to 70% with no wall abnormality.  MRV brain done showing no evidence of intracranial venous thrombosis and mild vascular enhancement felt to be reactive in nature.  Neurology recommends repeat CTA head/MRI brain on outpatient basis hematoma resolves to rule out underlying source of bleed.  Patient continues to be limited by left facial droop  with right gaze preference, left hemiplegia as well as cognitive deficits.  He was placed on modified diet of dysphagia to thin liquids.  Patient transferred to CIR on 05/20/2021 .  Clinical Impression ST evaluation completed today was limited by patient's significant amount of lethargy and so further assessment of his cognitive-linguistic, speech and swallow abilities will be ongoing. During oral intake of PO's (Dys 2 solids, thin liquids lunch tray) patient able to feed self with spoon with varying min-mod verbal and tactile cues to initiate. Mastication of solids was prolonged and food pocketing and residuals observed throughout oral cavity but primarily in left buccal cavity. Patient also exhibited oral holding of solids and liquids, during PO intake and during oral care. He required maximal verbal and tactile cues to expectorate water and residuals after swishing. Patient exhibited a mod-severe cognitive-linguistic impairment and mild-mod speech impairment as per limited sample. Patient oriented to year "2022", stated place as "clinic" but was not oriented to month, day of week, situation. Voice was low in intensity but speech was fairly intelligible at short phrase levels. Currently, patient exhibiting significant impairment of initiation, awareness, problem solving, recall and currently is very lethargic. He will benefit from skilled SLP intervention to improve his swallow function, basic level safety, awareness, problem solving and use of memory strategies during CIR stay.  Skilled Therapeutic Interventions          Speech-language evaluation, Bedside swallow evaluation  SLP Assessment  Patient will need skilled Speech Lanaguage Pathology Services during CIR admission    Recommendations  SLP Diet Recommendations: Dysphagia 2 (Fine chop);Thin Liquid Administration via: Cup;Straw Medication Administration: Crushed with puree Supervision: Staff  to assist with self feeding;Full supervision/cueing for  compensatory strategies Compensations: Minimize environmental distractions;Slow rate;Small sips/bites;Lingual sweep for clearance of pocketing;Follow solids with liquid Postural Changes and/or Swallow Maneuvers: Seated upright 90 degrees Oral Care Recommendations: Oral care BID;Other (Comment) (assist patient with this) Patient destination: Home Follow up Recommendations: Home Health SLP;24 hour supervision/assistance Equipment Recommended: None recommended by SLP    SLP Frequency 3 to 5 out of 7 days   SLP Duration  SLP Intensity  SLP Treatment/Interventions 26-30 days  Minumum of 1-2 x/day, 30 to 90 minutes  Cognitive remediation/compensation;Dysphagia/aspiration precaution training;Internal/external aids;Speech/Language facilitation;Environmental controls;Cueing hierarchy;Functional tasks;Patient/family education    Pain Pain Assessment Pain Scale: Faces Faces Pain Scale: No hurt  Prior Functioning Cognitive/Linguistic Baseline: Baseline deficits Baseline deficit details: family has reported memory impairments that have been worsening in recent months, has diagnosis of mild cognitive impairment with dementia diagnostic testing in process Type of Home: House  Lives With: Spouse Available Help at Discharge: Family;Available 24 hours/day Vocation: Full time employment  SLP Evaluation Cognition Overall Cognitive Status: Impaired/Different from baseline Arousal/Alertness: Lethargic Orientation Level: Oriented to person;Disoriented to place;Disoriented to time;Disoriented to situation Attention: Sustained Sustained Attention: Impaired Sustained Attention Impairment: Verbal basic;Functional basic Memory: Impaired Memory Impairment: Decreased recall of new information;Storage deficit;Retrieval deficit Immediate Memory Recall: Sock;Blue;Bed Memory Recall Sock: Not able to recall Memory Recall Blue: Not able to recall Memory Recall Bed: Not able to recall Awareness:  Impaired Awareness Impairment: Intellectual impairment Problem Solving: Impaired Problem Solving Impairment: Verbal basic;Functional basic Executive Function: Initiating Initiating: Impaired Initiating Impairment: Functional basic Safety/Judgment: Impaired Comments: Pt states reason for being in hospital "to get my mind right."  Comprehension Auditory Comprehension Overall Auditory Comprehension: Other (comment) (difficult to fully assess secondary to patient's lethargy during eval on CIR. Acute SLP assessment reports WFL) Expression Expression Primary Mode of Expression: Verbal Verbal Expression Overall Verbal Expression: Impaired Initiation: Impaired Level of Generative/Spontaneous Verbalization: Word;Phrase Pragmatics: Impairment Impairments: Eye contact Interfering Components: Attention Non-Verbal Means of Communication: Not applicable Written Expression Dominant Hand: Right Written Expression: Not tested Oral Motor Oral Motor/Sensory Function Overall Oral Motor/Sensory Function: Moderate impairment Facial ROM: Reduced left;Suspected CN VII (facial) dysfunction Facial Symmetry: Abnormal symmetry left;Suspected CN VII (facial) dysfunction Facial Strength: Reduced left;Suspected CN VII (facial) dysfunction Lingual ROM: Reduced left;Suspected CN XII (hypoglossal) dysfunction Lingual Symmetry: Within Functional Limits Lingual Strength: Reduced;Suspected CN XII (hypoglossal) dysfunction Velum: Within Functional Limits Mandible: Within Functional Limits Motor Speech Overall Motor Speech: Impaired Respiration: Within functional limits Phonation: Low vocal intensity Resonance: Within functional limits Articulation: Impaired Level of Impairment: Phrase Intelligibility: Intelligibility reduced Word: 75-100% accurate Phrase: 75-100% accurate Sentence: Not tested Conversation: Not tested Motor Planning: Witnin functional limits Motor Speech Errors: Not applicable  Care  Tool Care Tool Cognition Expression of Ideas and Wants Expression of Ideas and Wants: Frequent difficulty - frequently exhibits difficulty with expressing needs and ideas   Understanding Verbal and Non-Verbal Content Understanding Verbal and Non-Verbal Content: Sometimes understands - understands only basic conversations or simple, direct phrases. Frequently requires cues to understand   Memory/Recall Ability *first 3 days only Memory/Recall Ability *first 3 days only: That he or she is in a hospital/hospital unit    Intelligibility: Intelligibility reduced Word: 75-100% accurate Phrase: 75-100% accurate Sentence: Not tested Conversation: Not tested  Bedside Swallowing Assessment General Date of Onset: 05/11/21 Previous Swallow Assessment: 5/31 BSE Diet Prior to this Study: Dysphagia 2 (chopped);Thin liquids Temperature Spikes Noted: No Respiratory Status: Room air History of Recent Intubation: No Behavior/Cognition: Lethargic/Drowsy;Requires cueing  Oral Cavity - Dentition: Adequate natural dentition Self-Feeding Abilities: Needs assist Vision: Impaired for self-feeding Patient Positioning: Upright in bed Baseline Vocal Quality: Low vocal intensity Volitional Cough: Cognitively unable to elicit Volitional Swallow: Unable to elicit  Oral Care Assessment   Ice Chips Ice chips: Not tested Thin Liquid Thin Liquid: Within functional limits Presentation: Cup;Straw Oral Phase Impairments: Reduced labial seal Oral Phase Functional Implications: Oral holding;Prolonged oral transit Other Comments: Patient required mod cues to expectorate during oral care and would hold liquids in mouth Nectar Thick   Honey Thick   Puree Puree: Impaired Presentation: Spoon Oral Phase Impairments: Reduced lingual movement/coordination Oral Phase Functional Implications: Prolonged oral transit;Oral residue;Oral holding Solid Solid: Impaired Presentation: Spoon Oral Phase Impairments: Reduced  lingual movement/coordination Oral Phase Functional Implications: Prolonged oral transit;Impaired mastication;Oral residue;Oral holding BSE Assessment Risk for Aspiration Impact on safety and function: Mild aspiration risk Other Related Risk Factors: Lethargy;Cognitive impairment  Short Term Goals: Week 1: SLP Short Term Goal 1 (Week 1): Patient will consume current diet (Dys 2 solids, thin liquids) with minA cues to clear oral residuals. SLP Short Term Goal 2 (Week 1): Patient will utiize visual aides to orient to time/date/place/situation with modA cues. SLP Short Term Goal 3 (Week 1): Patient will perform functional ADL tasks (self-feeding, oral care, etc) with modA cues to initiate and maintain attention. SLP Short Term Goal 4 (Week 1): Patient participate in further cognitive-linguistic assessments when consistently alert. SLP Short Term Goal 5 (Week 1): Patient will initiate to comment, request during structured tasks with minA cues.  Refer to Care Plan for Long Term Goals  Recommendations for other services: Neuropsych  Discharge Criteria: Patient will be discharged from SLP if patient refuses treatment 3 consecutive times without medical reason, if treatment goals not met, if there is a change in medical status, if patient makes no progress towards goals or if patient is discharged from hospital.  The above assessment, treatment plan, treatment alternatives and goals were discussed and mutually agreed upon: No family available/patient unable  Sonia Baller, MA, CCC-SLP Speech Therapy

## 2021-05-21 NOTE — Plan of Care (Signed)
  Problem: RH Balance Goal: LTG Patient will maintain dynamic sitting balance (PT) Description: LTG:  Patient will maintain dynamic sitting balance with assistance during mobility activities (PT) Flowsheets (Taken 05/21/2021 1228) LTG: Pt will maintain dynamic sitting balance during mobility activities with:: Supervision/Verbal cueing Goal: LTG Patient will maintain dynamic standing balance (PT) Description: LTG:  Patient will maintain dynamic standing balance with assistance during mobility activities (PT) Flowsheets (Taken 05/21/2021 1228) LTG: Pt will maintain dynamic standing balance during mobility activities with:: Moderate Assistance - Patient 50 - 74%   Problem: Sit to Stand Goal: LTG:  Patient will perform sit to stand with assistance level (PT) Description: LTG:  Patient will perform sit to stand with assistance level (PT) Flowsheets (Taken 05/21/2021 1228) LTG: PT will perform sit to stand in preparation for functional mobility with assistance level: Minimal Assistance - Patient > 75%   Problem: RH Bed Mobility Goal: LTG Patient will perform bed mobility with assist (PT) Description: LTG: Patient will perform bed mobility with assistance, with/without cues (PT). Flowsheets (Taken 05/21/2021 1228) LTG: Pt will perform bed mobility with assistance level of: Minimal Assistance - Patient > 75%   Problem: RH Bed to Chair Transfers Goal: LTG Patient will perform bed/chair transfers w/assist (PT) Description: LTG: Patient will perform bed to chair transfers with assistance (PT). Flowsheets (Taken 05/21/2021 1228) LTG: Pt will perform Bed to Chair Transfers with assistance level: Minimal Assistance - Patient > 75%   Problem: RH Car Transfers Goal: LTG Patient will perform car transfers with assist (PT) Description: LTG: Patient will perform car transfers with assistance (PT). Flowsheets (Taken 05/21/2021 1228) LTG: Pt will perform car transfers with assist:: Moderate Assistance - Patient  50 - 74%   Problem: RH Ambulation Goal: LTG Patient will ambulate in controlled environment (PT) Description: LTG: Patient will ambulate in a controlled environment, # of feet with assistance (PT). Flowsheets (Taken 05/21/2021 1228) LTG: Pt will ambulate in controlled environ  assist needed:: Moderate Assistance - Patient 50 - 74% LTG: Ambulation distance in controlled environment: 57ft with LRAD and PT only   Problem: RH Wheelchair Mobility Goal: LTG Patient will propel w/c in controlled environment (PT) Description: LTG: Patient will propel wheelchair in controlled environment, # of feet with assist (PT) Flowsheets (Taken 05/21/2021 1228) LTG: Pt will propel w/c in controlled environ  assist needed:: Supervision/Verbal cueing LTG: Propel w/c distance in controlled environment: 14ft Goal: LTG Patient will propel w/c in home environment (PT) Description: LTG: Patient will propel wheelchair in home environment, # of feet with assistance (PT). Flowsheets (Taken 05/21/2021 1228) LTG: Pt will propel w/c in home environ  assist needed:: Supervision/Verbal cueing Distance: wheelchair distance in controlled environment: 50   Problem: RH Stairs Goal: LTG Patient will ambulate up and down stairs w/assist (PT) Description: LTG: Patient will ambulate up and down # of stairs with assistance (PT) Flowsheets (Taken 05/21/2021 1228) LTG: Pt will  ambulate up and down number of stairs: TBD.

## 2021-05-21 NOTE — Evaluation (Signed)
Occupational Therapy Assessment and Plan  Patient Details  Name: Donald [name] MRN: [id number] Date of Birth: 1956-01-14  OT Diagnosis: abnormal posture, cognitive deficits, flaccid hemiplegia and hemiparesis, hemiplegia affecting non-dominant side, muscle weakness (generalized), and pushers syndrome, decreased postural control, decreased activity tolerance, impairing ADL/func mobility performance Rehab Potential: Rehab Potential (ACUTE ONLY): Fair ELOS: 3.5 to 4 weeks   Today's Date: 05/21/2021 OT Individual Time: 3748-2707 OT Individual Time Calculation (min): 54 min     Hospital Problem: Principal Problem:   ICH (intracerebral hemorrhage) ([facility])   Past Medical History:  Past Medical History:  Diagnosis Date   Arthritis    Past Surgical History:  Past Surgical History:  Procedure Laterality Date   APPENDECTOMY      Assessment & Plan Clinical Impression: Patient is a 65 y.o. year old male with history of declining memory and  personality changes which were present after fall in 2020 and in process of being referred to do for dementia work-up.  He has otherwise been in good health.  He was admitted on 05/11/21 with acute left hemiparesis, sensory deficits, right gaze preference and left facial droop.  UDS was negative.  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  CTA head/neck was negative for LVO and showed narrowing of superior sagittal sinus with question of mass-effect versus intraluminal thrombus.  Follow-up MRI/MRA brain showed no significant change in bleed with surrounding edema and mild mass-effect and chronic hemosiderin in right parietal lobe indicating site of prior hemorrhage.  He was started on Cleviprex for BP control and Dr. Erlinda Hong question CAA.   Echocardiogram showed EF of 65 to 70% with no wall abnormality.  MRV brain done showing no evidence of intracranial venous thrombosis and mild vascular  enhancement felt to be reactive in nature.  Neurology recommends repeat CTA head/MRI brain on outpatient basis hematoma resolves to rule out underlying source of bleed.  Patient continues to be limited by left facial droop with right gaze preference, left hemiplegia as well as cognitive deficits.  He was placed on modified diet of dysphagia to thin liquids.  Therapy ongoing and patient able to sit edge of bed for 8 minutes is working on standing with left knee supported.  CIR recommended due to functional decline Patient transferred to CIR on 05/20/2021 .    Patient currently requires total with basic self-care skills secondary to muscle weakness, decreased cardiorespiratoy endurance, impaired timing and sequencing, abnormal tone, unbalanced muscle activation, decreased coordination, and decreased motor planning, decreased midline orientation, decreased attention to left, left side neglect, and decreased motor planning, decreased initiation, decreased attention, decreased awareness, decreased problem solving, decreased safety awareness, decreased memory, and delayed processing, and decreased sitting balance, decreased postural control, hemiplegia, and decreased balance strategies.  Prior to hospitalization, patient could complete BADL with  ind to S .  Patient will benefit from skilled intervention to decrease level of assist with basic self-care skills prior to discharge home with care partner.  Anticipate patient will require 24 hour supervision and moderate physical assestance and follow up home health.  OT - End of Session Activity Tolerance: Tolerates < 10 min activity, no significant change in vital signs Endurance Deficit: Yes Endurance Deficit Description: Unable to maintain eyes open throughout majority of session OT Assessment Rehab Potential (ACUTE ONLY): Fair OT Patient demonstrates impairments in the following area(s): Balance;Endurance;Perception;Vision;Motor;Safety;Sensory;Cognition;Skin  Integrity OT Basic ADL's Functional Problem(s): Eating;Grooming;Bathing;Dressing;Toileting OT Transfers Functional Problem(s): Tub/Shower;Toilet OT Additional Impairment(s): Fuctional Use  of Upper Extremity OT Plan OT Intensity: Minimum of 1-2 x/day, 45 to 90 minutes OT Frequency: 5 out of 7 days OT Duration/Estimated Length of Stay: 3.5 to 4 weeks OT Treatment/Interventions: Balance/vestibular training;Cognitive remediation/compensation;Community reintegration;Discharge planning;Disease mangement/prevention;DME/adaptive equipment instruction;Functional electrical stimulation;Functional mobility training;Neuromuscular re-education;Pain management;Patient/family education;Psychosocial support;Self Care/advanced ADL retraining;Skin care/wound managment;Splinting/orthotics;Therapeutic Activities;Therapeutic Exercise;UE/LE Strength taining/ROM;UE/LE Coordination activities;Visual/perceptual remediation/compensation;Wheelchair propulsion/positioning OT Self Feeding Anticipated Outcome(s): S OT Basic Self-Care Anticipated Outcome(s): mod A OT Toileting Anticipated Outcome(s): mod A OT Bathroom Transfers Anticipated Outcome(s): mod A OT Recommendation Patient destination: Home Follow Up Recommendations: Home health OT;Skilled nursing facility Equipment Recommended: 3 in 1 bedside comode;To be determined   OT Evaluation Precautions/Restrictions  Precautions Precautions: Fall Precaution Comments: L hemiparesis, L inattention, pusher to the L, cognitive deficits Restrictions Weight Bearing Restrictions: No General Chart Reviewed: Yes Family/Caregiver Present: No  Pain Pain Assessment Pain Score: 7  Pain Location: Neck Pain Orientation: Right Pain Descriptors / Indicators: Aching Home Living/Prior Functioning Home Living Family/patient expects to be discharged to:: Private residence Living Arrangements: Spouse/significant other Available Help at Discharge: Family, Available 24  hours/day Type of Home: House Home Access: Stairs to enter Technical brewer of Steps: 4 Entrance Stairs-Rails: Left Home Layout: One level Bathroom Shower/Tub: Multimedia programmer: Standard Bathroom Accessibility: Yes Additional Comments: has access to RW from pt's MIL  Lives With: Spouse IADL History Occupation: Full time employment Prior Function Level of Independence: Independent with basic ADLs  Able to Take Stairs?: Yes Driving: Yes Vocation: Full time employment 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 Vision Baseline Vision/History: No visual deficits Patient Visual Report: No change from baseline Vision Assessment?: Vision impaired- to be further tested in functional context Perception  Perception: Impaired Inattention/Neglect: Does not attend to left visual field;Does not attend to left side of body Praxis Praxis: Impaired Praxis Impairment Details: Motor planning;Initiation Praxis-Other Comments: on the L Cognition Overall Cognitive Status: Impaired/Different from baseline Arousal/Alertness: Lethargic Orientation Level: Person;Place Year: 2022 (when given 2 choices) Month: May Day of Week: Incorrect Memory: Impaired Memory Impairment: Decreased recall of new information;Storage deficit;Retrieval deficit Immediate Memory Recall: Sock;Blue;Bed Memory Recall Sock: Not able to recall Memory Recall Blue: Not able to recall Memory Recall Bed: Not able to recall Attention: Sustained Sustained Attention: Impaired Sustained Attention Impairment: Verbal basic;Functional basic Awareness: Impaired Awareness Impairment: Intellectual impairment Problem Solving: Impaired Problem Solving Impairment: Verbal basic;Functional basic Executive Function: Initiating Initiating: Impaired Initiating Impairment: Functional basic Safety/Judgment: Impaired Comments: Pt states reason for being in  hospital "to get my mind right." Sensation Sensation Light Touch: Impaired Detail Light Touch Impaired Details: Impaired LUE;Impaired LLE Hot/Cold: Not tested Proprioception: Impaired by gross assessment Stereognosis: Impaired by gross assessment Additional Comments: When painful stimuli present at L foot reports "feels heavy"" otherwise no reaction to stimuli on LLE/LUE Coordination Gross Motor Movements are Fluid and Coordinated: No Fine Motor Movements are Fluid and Coordinated: No Coordination and Movement Description: dense L sided Hemiplegia Finger Nose Finger Test: unable to perform on L Motor  Motor Motor: Hemiplegia;Abnormal tone;Motor apraxia;Motor impersistence;Abnormal postural alignment and control Motor - Skilled Clinical Observations: Dense L hemiplegia UE>LE. flexor tone in the LUE. extensor tone in the LLE  Trunk/Postural Assessment  Cervical Assessment Cervical Assessment: Exceptions to [facility] (head tilt L) Thoracic Assessment Thoracic Assessment: Exceptions to [facility] (pusher to L) Lumbar Assessment Lumbar Assessment: Exceptions to WFL (L lateral lean) Postural Control Postural Control: Deficits on evaluation Head Control: L tilt Trunk Control: lateral  bias to the l Postural Limitations: pushers syndrome to the L  Balance Balance Balance Assessed: Yes Static Sitting Balance Static Sitting - Balance Support: Right upper extremity supported;Feet supported Static Sitting - Level of Assistance: 1: +2 Total assist Dynamic Sitting Balance Dynamic Sitting - Balance Support: Right upper extremity supported;Feet supported Dynamic Sitting - Level of Assistance: 1: +1 Total assist Sitting balance - Comments: Pusher to the L Static Standing Balance Static Standing - Level of Assistance: 1: +2 Total assist Dynamic Standing Balance Dynamic Standing - Level of Assistance: 1: +2 Total assist Extremity/Trunk Assessment RUE Assessment RUE Assessment: Within Functional  Limits LUE Assessment LUE Assessment: Exceptions to [facility] Active Range of Motion (AROM) Comments: Brunnstrum level I in LUE, L hand LUE Body System: Neuro Brunstrum levels for arm and hand: Arm;Hand Brunstrum level for arm: Stage I Presynergy Brunstrum level for hand: Stage I Flaccidity  Care Tool Care Tool Self Care Eating Eating activity did not occur: Safety/medical concerns (pt not alert enought to attempt to eat breakfast)      Oral Care    Oral Care Assist Level: Total assistance - Patient < 25%    Bathing     Body parts bathed by helper: Right arm;Left arm;Left lower leg;Face;Chest;Abdomen;Front perineal area;Buttocks;Right upper leg;Left upper leg;Right lower leg   Assist Level: 2 Helpers    Upper Body Dressing(including orthotics)   What is the patient wearing?: Hospital gown only;Pull over shirt   Assist Level: 2 Helpers    Lower Body Dressing (excluding footwear)   What is the patient wearing?: Pants;Incontinence brief Assist for lower body dressing: 2 Helpers    Putting on/Taking off footwear   What is the patient wearing?: Non-skid slipper socks Assist for footwear: Dependent - Patient 0%       Care Tool Toileting Toileting activity   Assist for toileting: 2 Helpers (bed level)     Care Tool Bed Mobility Roll left and right activity   Roll left and right assist level: 2 Helpers    Sit to lying activity   Sit to lying assist level: 2 Helpers    Lying to sitting edge of bed activity   Lying to sitting edge of bed assist level: 2 Helpers     Care Tool Transfers Sit to stand transfer Sit to stand activity did not occur: Safety/medical concerns      Chair/bed transfer Chair/bed transfer activity did not occur: Safety/medical concerns       Toilet transfer Toilet transfer activity did not occur: Safety/medical concerns       Care Tool Cognition Expression of Ideas and Wants Expression of Ideas and Wants: Frequent difficulty - frequently exhibits  difficulty with expressing needs and ideas   Understanding Verbal and Non-Verbal Content Understanding Verbal and Non-Verbal Content: Sometimes understands - understands only basic conversations or simple, direct phrases. Frequently requires cues to understand   Memory/Recall Ability *first 3 days only Memory/Recall Ability *first 3 days only: Current season;That he or she is in a hospital/hospital unit    Refer to Care Plan for [place] [facility] OT Short Term Goal 1 (Week 1): Pt will maintain static sitting balance with max A of 1 in prep for seated ADL. OT Short Term Goal 2 (Week 1): Pt will completed grooming task with mod A in supported sitting. OT Short Term Goal 3 (Week 1): Pt will don shirt with max A in supported sitting. OT Short Term Goal 4 (Week 1): Pt will  maintain midline orientation in supported sitting with overall mod A.  Recommendations for other services: None    Skilled Therapeutic Intervention ADL ADL Eating: Dependent Where Assessed-Eating: Bed level Grooming: Dependent Where Assessed-Grooming: Bed level Upper Body Bathing: Dependent Where Assessed-Upper Body Bathing: Bed level Lower Body Bathing: Dependent Where Assessed-Lower Body Bathing: Bed level Upper Body Dressing: Dependent Where Assessed-Upper Body Dressing: Bed level Lower Body Dressing: Dependent Where Assessed-Lower Body Dressing: Bed level Toileting: Dependent Where Assessed-Toileting: Bed level Toilet Transfer: Not assessed Tub/Shower Transfer: Not assessed Walk-In Shower Transfer: Not assessed Mobility  Bed Mobility Bed Mobility: Rolling Right;Rolling Left;Sit to Supine;Supine to Sit Rolling Right: 2 Helpers Rolling Left: 2 Helpers Supine to Sit: 2 Helpers Sit to Supine: 2 Helpers  Session Note: Pt received semi-reclined in bed, asleep but awakened to mod verbal and tactile cues, agreeable to OT eval. Pt responding throughout majority of session, but his eyes  remained closed and overall presenting with decreased alertness. No s/sx of pain throughout eval. Reviewed role of CIR OT, evaluation process, ADL/func mobility retraining, goals for therapy, and safety plan. Evaluation completed as documented above with session focus on static sitting balance, bed mobility, and bed level bathing/dressing. Pt came to EOB on his  R with heavy total A, displaying moderate to severe pusher tendencies to his L with a heavy L lean. Req total A + 2 for remainder of session to roll R/L and maintain static sitting EOB. Noted small incont of bowel, req heavy total A of one to complete pericare. Pt is able to assist by initiating rolling to his L and helps thread his RUE. When given two choices, he states he is in a hospital "to get his mind right." Additionally able to state the correct year and month when given two choices. Oriented to call button, pt perseverative on using the call button when "he doesn't need anything," despite initial instructions. MD in/out for assessment.  Pt left semi-reclined in bed with hemi-side protected, four bed rails up, bed alarm engaged, and call bell in reach.  Discharge Criteria: Patient will be discharged from OT if patient refuses treatment 3 consecutive times without medical reason, if treatment goals not met, if there is a change in medical status, if patient makes no progress towards goals or if patient is discharged from hospital.  The above assessment, treatment plan, treatment alternatives and goals were discussed and mutually agreed upon: by patient  Volanda Napoleon MS, OTR/L   05/21/2021, 9:01 AM

## 2021-05-21 NOTE — Progress Notes (Signed)
Patient ID: Donald [name], male   DOB: May 27, 1956, 65 y.o.   MRN: [id number]  Met with the patient to introduce self and review role of the nurse CM . Patient kept eyes closed most of the interaction however responsive to questions and acknowledged understanding of information reviewed. 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 Reviewed secondary stroke risks including HTN and HLD (LDLD140 goal < 70) and dietary modifications. Will need to follow up with wife [name]) to review dietary modifications and medications for discharge. Paitent with a condom cath as ll. Upgraded to D2 diet but little appetite noted. Continue to follow along to discharge to address educational needs and collaborate with the SW to facilitate preparation for discharge. Margarito Liner, RN

## 2021-05-21 NOTE — Plan of Care (Signed)
Problem: RH Balance Goal: LTG: Patient will maintain dynamic sitting balance (OT) Description: LTG:  Patient will maintain dynamic sitting balance with assistance during activities of daily living (OT) Flowsheets (Taken 05/21/2021 1750) LTG: Pt will maintain dynamic sitting balance during ADLs with: Contact Guard/Touching assist Goal: LTG Patient will maintain dynamic standing with ADLs (OT) Description: LTG:  Patient will maintain dynamic standing balance with assist during activities of daily living (OT)  Flowsheets (Taken 05/21/2021 1750) LTG: Pt will maintain dynamic standing balance during ADLs with: Moderate Assistance - Patient 50 - 74%   Problem: Sit to Stand Goal: LTG:  Patient will perform sit to stand in prep for activites of daily living with assistance level (OT) Description: LTG:  Patient will perform sit to stand in prep for activites of daily living with assistance level (OT) Flowsheets (Taken 05/21/2021 1750) LTG: PT will perform sit to stand in prep for activites of daily living with assistance level: Minimal Assistance - Patient > 75%   Problem: RH Eating Goal: LTG Patient will perform eating w/assist, cues/equip (OT) Description: LTG: Patient will perform eating with assist, with/without cues using equipment (OT) Flowsheets (Taken 05/21/2021 1750) LTG: Pt will perform eating with assistance level of: Supervision/Verbal cueing   Problem: RH Grooming Goal: LTG Patient will perform grooming w/assist,cues/equip (OT) Description: LTG: Patient will perform grooming with assist, with/without cues using equipment (OT) Flowsheets (Taken 05/21/2021 1750) LTG: Pt will perform grooming with assistance level of: Supervision/Verbal cueing   Problem: RH Bathing Goal: LTG Patient will bathe all body parts with assist levels (OT) Description: LTG: Patient will bathe all body parts with assist levels (OT) Flowsheets (Taken 05/21/2021 1750) LTG: Pt will perform bathing with assistance  level/cueing: Minimal Assistance - Patient > 75%   Problem: RH Dressing Goal: LTG Patient will perform upper body dressing (OT) Description: LTG Patient will perform upper body dressing with assist, with/without cues (OT). Flowsheets (Taken 05/21/2021 1750) LTG: Pt will perform upper body dressing with assistance level of: Minimal Assistance - Patient > 75% Goal: LTG Patient will perform lower body dressing w/assist (OT) Description: LTG: Patient will perform lower body dressing with assist, with/without cues in positioning using equipment (OT) Flowsheets (Taken 05/21/2021 1750) LTG: Pt will perform lower body dressing with assistance level of: Moderate Assistance - Patient 50 - 74%   Problem: RH Toileting Goal: LTG Patient will perform toileting task (3/3 steps) with assistance level (OT) Description: LTG: Patient will perform toileting task (3/3 steps) with assistance level (OT)  Flowsheets (Taken 05/21/2021 1750) LTG: Pt will perform toileting task (3/3 steps) with assistance level: Moderate Assistance - Patient 50 - 74%   Problem: RH Functional Use of Upper Extremity Goal: LTG Patient will use RT/LT upper extremity as a (OT) Description: LTG: Patient will use right/left upper extremity as a stabilizer/gross assist/diminished/nondominant/dominant level with assist, with/without cues during functional activity (OT) Flowsheets (Taken 05/21/2021 1750) LTG: Use of upper extremity in functional activities: RUE as nondominant level LTG: Pt will use upper extremity in functional activity with assistance level of: Total Assistance - Patient < 25%   Problem: RH Toilet Transfers Goal: LTG Patient will perform toilet transfers w/assist (OT) Description: LTG: Patient will perform toilet transfers with assist, with/without cues using equipment (OT) Flowsheets (Taken 05/21/2021 1750) LTG: Pt will perform toilet transfers with assistance level of: Moderate Assistance - Patient 50 - 74%   Problem: RH  Tub/Shower Transfers Goal: LTG Patient will perform tub/shower transfers w/assist (OT) Description: LTG: Patient will perform tub/shower transfers with  assist, with/without cues using equipment (OT) Flowsheets (Taken 05/21/2021 1750) LTG: Pt will perform tub/shower stall transfers with assistance level of: Moderate Assistance - Patient 50 - 74%

## 2021-05-21 NOTE — Progress Notes (Signed)
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 EXAM: ABDOMEN - 1 VIEW COMPARISON:  None. FINDINGS: Large amount of intestinal gas throughout small and large bowel suggesting ileus. There does not appear to be a large amount of fecal matter. No focal obstruction is evident. No free air. No abnormal calcifications or significant bone findings. IMPRESSION: Large amount of intestinal gas suggesting ileus. Electronically Signed   By: Paulina Fusi M.D.   On: 05/20/2021 19:25   Recent Labs    05/21/21 0532  WBC 8.7  HGB 16.2  HCT 47.5  PLT 292   Recent Labs    05/21/21 0532  NA 134*  K 4.3  CL 95*  CO2 29  GLUCOSE 133*  BUN 20  CREATININE 1.20  CALCIUM 10.0    Intake/Output Summary (Last 24 hours) at 05/21/2021 0826 Last data filed at 05/21/2021 0427 Gross per 24 hour  Intake --  Output 450 ml  Net -450 ml        Physical Exam: Vital Signs Blood pressure 137/86, pulse 95, temperature 97.8 F (36.6 C), resp. rate 16, height 5\' 5"  (1.651 m), weight 66 kg, SpO2 100 %.  General: No acute distress Mood and affect are appropriate Heart: Regular rate and rhythm no rubs murmurs or extra sounds Lungs: Clear to auscultation, breathing unlabored, no rales or wheezes Abdomen: Positive bowel sounds, soft nontender to palpation, nondistended Extremities: No clubbing, cyanosis, or edema Skin: No evidence of breakdown, no evidence of rash Neurologic: Cranial nerves II through XII intact, motor strength is 5/5 in RIgh tand 0/5 left deltoid, bicep, tricep, grip, hip flexor, knee extensors, ankle dorsiflexor and plantar flexor Sensory exam absent pinch L hand and toes  Cerebellar exam normal finger to nose to finger as well  as heel to shin in bilateral upper and lower extremities Musculoskeletal: Full range of motion in all 4 extremities. No joint swelling    Assessment/Plan: 1. Functional deficits which require 3+ hours per day of interdisciplinary therapy in a comprehensive inpatient rehab setting. Physiatrist is providing close team supervision and 24 hour management of active medical problems listed below. Physiatrist and rehab team continue to assess barriers to discharge/monitor patient progress toward functional and medical goals  Care Tool:  Bathing              Bathing assist       Upper Body Dressing/Undressing Upper body dressing        Upper body assist      Lower Body Dressing/Undressing Lower body dressing            Lower body assist       Toileting Toileting    Toileting assist Assist for toileting: Dependent - Patient 0% (Pt has on condom cath.)     Transfers Chair/bed transfer  Transfers assist           Locomotion Ambulation   Ambulation assist  Walk 10 feet activity   Assist           Walk 50 feet activity   Assist           Walk 150 feet activity   Assist           Walk 10 feet on uneven surface  activity   Assist           Wheelchair     Assist               Wheelchair 50 feet with 2 turns activity    Assist            Wheelchair 150 feet activity     Assist          Blood pressure 137/86, pulse 95, temperature 97.8 F (36.6 C), resp. rate 16, height 5\' 5"  (1.651 m), weight 66 kg, SpO2 100 %.  Medical Problem List and Plan: 1.  Decline in Mobilit yand ADL secondary to RIght frontal IPH d/t HTN             -patient may  shower, PRAFO for LLE, rehab evals today              -ELOS/Goals: 20-22d, min assist PT,OT and supervision to min assist with SLP     2.  Antithrombotics: -DVT/anticoagulation:  Pharmaceutical: Heparin             -antiplatelet therapy: N/A  due to bleed  3. Pain Management: Tylenol prn 4. Mood: LCSW to follow for evaluation and support.              -antipsychotic agents: N/a 5. Neuropsych: This patient is not capable of making decisions on his own behalf. 6. Skin/Wound Care: Routine pressure relief measures.  7. Fluids/Electrolytes/Nutrition: Monitor I/O. Check lytes in am. 8. OSA: Was not compliant with CPAP 9. Hyponatremia: Will recheck lytes in am.borderline 134 , no tx needed  10. HTN: Monitor BP tid--continue Microzide daily. Vitals:   05/20/21 1953 05/21/21 0421  BP: 103/72 137/86  Pulse: 91 95  Resp: 17 16  Temp: 98.5 F (36.9 C) 97.8 F (36.6 C)  SpO2: 99% 100%   Controlled 6/10 11. Prediabetes: Hgb A1c-5.8. Blood sugars have been reasonable in 100-120 range. Continue Carb Modified restrictions.no need for CBG   12. Dyslipidemia: Statin held due to bleed--resume at discharge. 13. Constipation: had large bm 6/8, BM x 2 6/9, KUB suggesting ileus but exam with + bowel sounds , no tenderness, no need for diet modification at this time     LOS: 1 days A FACE TO FACE EVALUATION WAS PERFORMED  8/9 05/21/2021, 8:26 AM

## 2021-05-21 NOTE — Evaluation (Signed)
Physical Therapy Assessment and Plan  Patient Details  Name: Donald [name] MRN: [id number] Date of Birth: [birth year]  PT Diagnosis: Abnormal posture, Abnormality of gait, Cognitive deficits, Coordination disorder, Hemiparesis non-dominant, Hypertonia, Impaired cognition, Impaired sensation, and Muscle weakness Rehab Potential: Fair ELOS: 26-30 days   Today's Date: 05/21/2021 PT Individual Time: 5188-4166 PT Individual Time Calculation (min): 70 min    Hospital Problem: Principal Problem:   ICH (intracerebral hemorrhage) ([place])   Past Medical History:  Past Medical History:  Diagnosis Date   Arthritis    Past Surgical History:  Past Surgical History:  Procedure Laterality Date   APPENDECTOMY      Assessment & Plan Clinical Impression: Patient is a 65 y.o. year old right handed male with history of declining memory and  personality changes which were present after fall in 2020 and in process of being referred to do for dementia work-up.  He has otherwise been in good health.  He was admitted on 05/11/21 with acute left hemiparesis, sensory deficits, right gaze preference and left facial droop.  UDS was negative.  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  CTA head/neck was negative for LVO and showed narrowing of superior sagittal sinus with question of mass-effect versus intraluminal thrombus.  Follow-up MRI/MRA brain showed no significant change in bleed with surrounding edema and mild mass-effect and chronic hemosiderin in right parietal lobe indicating site of prior hemorrhage.  He was started on Cleviprex for BP control and Dr. Erlinda Hong question CAA.   Echocardiogram showed EF of 65 to 70% with no wall abnormality.  MRV brain done showing no evidence of intracranial venous thrombosis and mild vascular enhancement felt to be reactive in nature.  Neurology recommends repeat CTA head/MRI brain on outpatient basis  hematoma resolves to rule out underlying source of bleed.  Patient continues to be limited by left facial droop with right gaze preference, left hemiplegia as well as cognitive deficits.  He was placed on modified diet of dysphagia to thin liquids.  Patient transferred to CIR on 05/20/2021 .   Patient currently requires total with mobility secondary to muscle weakness, muscle joint tightness, and muscle paralysis, decreased cardiorespiratoy endurance, impaired timing and sequencing, abnormal tone, unbalanced muscle activation, motor apraxia, decreased coordination, and decreased motor planning, decreased visual motor skills, field cut, and hemianopsia, decreased midline orientation, decreased attention to left, and left side neglect, decreased initiation, decreased attention, decreased awareness, decreased problem solving, decreased safety awareness, decreased memory, and delayed processing, and decreased sitting balance, decreased standing balance, decreased postural control, hemiplegia, and decreased balance strategies.  Prior to hospitalization, patient was independent  with mobility and lived with Spouse in a House home.  Home access is 4Stairs to enter.  Patient will benefit from skilled PT intervention to maximize safe functional mobility, minimize fall risk, and decrease caregiver burden for planned discharge  SNF .  Anticipate patient will  Benefit from SNF placement  at discharge.  PT - End of Session Activity Tolerance: Tolerates < 10 min activity, no significant change in vital signs Endurance Deficit: Yes PT Assessment Rehab Potential (ACUTE/IP ONLY): Fair PT Barriers to Discharge: [place] home environment;Decreased caregiver support;Home environment access/layout;Incontinence;Lack of/limited family support;Insurance for SNF coverage;Medication compliance;Behavior;Nutrition means PT Patient demonstrates impairments in the following area(s):  Balance;Behavior;Endurance;Edema;Motor;Nutrition;Pain;Perception;Safety;Sensory;Skin Integrity PT Transfers Functional Problem(s): Bed Mobility;Bed to Chair;Car;Furniture;Floor PT Locomotion Functional Problem(s): Ambulation;Wheelchair Mobility;Stairs PT Plan PT Intensity: Minimum of 1-2 x/day ,45 to 90 minutes PT Frequency: 5  out of 7 days PT Duration Estimated Length of Stay: 64-30 days PT Treatment/Interventions: Ambulation/gait training;Balance/vestibular training;Cognitive remediation/compensation;DME/adaptive equipment instruction;Discharge planning;Community reintegration;Disease management/prevention;Functional electrical stimulation;Functional mobility training;Neuromuscular re-education;Pain management;Patient/family education;Psychosocial support;Skin care/wound management;Splinting/orthotics;Stair training;Therapeutic Activities;Therapeutic Exercise;Wheelchair propulsion/positioning;UE/LE Coordination activities;UE/LE Strength taining/ROM;Visual/perceptual remediation/compensation PT Transfers Anticipated Outcome(s): Min assist with Slide Board to and from [facility] PT Locomotion Anticipated Outcome(s): Supervision assist WC level. Mod-max assist ambulatory with therapy. PT Recommendation Follow Up Recommendations: Home health PT;Skilled nursing facility Patient destination: [place] (SNF) Equipment Recommended: To be determined;Wheelchair cushion (measurements);Wheelchair (measurements)   PT Evaluation Precautions/Restrictions   Pusher and fall   Pain   denies Home Living/Prior Functioning Home Living Living Arrangements: Spouse/significant other Available Help at Discharge: Family;Available 24 hours/day Type of Home: House Home Access: Stairs to enter [employer] of Steps: 4 Entrance Stairs-Rails: Left Home Layout: One level Bathroom Shower/Tub: Multimedia programmer: Standard Bathroom Accessibility: Yes Additional Comments: has access to  RW from pt's MIL  Lives With: Spouse Prior Function Level of Independence: Independent with basic ADLs  Able to Take Stairs?: Yes Driving: Yes Vocation: Full time employment 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 Vision/Perception  Vision - Assessment Eye Alignment: Impaired (comment) Ocular Range of Motion: Restricted on the right Alignment/Gaze Preference: Head turned;Head tilt;Gaze right Tracking/Visual Pursuits: Requires cues, head turns, or add eye shifts to track;Decreased smoothness of vertical tracking;Decreased smoothness of eye movement to LEFT superior field;Decreased smoothness of eye movement to LEFT inferior field Saccades: Decreased speed of saccadic movement Additional Comments: L inattention and field cut Perception Perception: Impaired Inattention/Neglect: Does not attend to left visual field;Does not attend to left side of body Praxis Praxis: Impaired Praxis Impairment Details: Motor planning;Initiation Praxis-Other Comments: on the L  Cognition Overall Cognitive Status: Impaired/Different from baseline Orientation Level: Oriented to person;Oriented to situation;Disoriented to place;Disoriented to time Sustained Attention: Impaired Memory: Impaired Awareness: Impaired Awareness Impairment: Intellectual impairment Problem Solving: Impaired Problem Solving Impairment: Verbal basic;Functional basic Safety/Judgment: Impaired Comments: pushers syndrome and poor awareness of deficits. Sensation Sensation Light Touch: Impaired Detail Light Touch Impaired Details: Impaired LUE;Impaired LLE Proprioception: Impaired by gross assessment Additional Comments: able to distinguish lighty tough in the LLE with delayed response Coordination Gross Motor Movements are Fluid and Coordinated: No Fine Motor Movements are Fluid and Coordinated: No Coordination and Movement Description: dense L sided  Hemiplegia Finger Nose Finger Test: [facility] on the R with cues dor visual scanning to the L. unable to perform on the L Heel Shin Test: poor awareness of positioning of the LLE. unable to perform on the L Motor  Motor Motor: Hemiplegia;Abnormal tone;Motor apraxia;Motor impersistence;Abnormal postural alignment and control Motor - Skilled Clinical Observations: Dense L hemiplegia UE>LE. flexor tone in the LUE. extensor tone in the LLE   Trunk/Postural Assessment  Cervical Assessment Cervical Assessment: Exceptions to [facility] (head tilt to the L/ rotation R.) Thoracic Assessment Thoracic Assessment: Exceptions to [facility] (pusher to the L) Lumbar Assessment Lumbar Assessment: Exceptions to WFL (L lateral lean) Postural Control Postural Control: Deficits on evaluation Head Control: L tilt Trunk Control: lateral bias to the l Righting Reactions: delayed on the R and absent on the L Protective Responses: absent on thre L Postural Limitations: pushers syndrome to the L  Balance Balance Balance Assessed: Yes Static Sitting Balance Static Sitting - Balance Support: Right upper extremity supported;Feet supported Static Sitting - Level of Assistance: 3: Mod assist;2: Max assist Dynamic Sitting Balance Dynamic Sitting - Balance Support:  Right upper extremity supported;Feet supported Dynamic Sitting - Level of Assistance: 1: +1 Total assist Sitting balance - Comments: Pusher to the L Static Standing Balance Static Standing - Level of Assistance: 1: +2 Total assist Dynamic Standing Balance Dynamic Standing - Level of Assistance: 1: +2 Total assist Extremity Assessment      RLE Assessment RLE Assessment: Within Functional Limits LLE Assessment LLE Assessment: Exceptions to [facility] General Strength Comments: functionally unable to perform MMT. tone in the LLE allowed partial WB of at least 3/5.  Care Tool Care Tool Bed Mobility Roll left and right activity   Roll left and right assist level: Total  Assistance - Patient < 25%    Sit to lying activity   Sit to lying assist level: 2 Helpers    Lying to sitting edge of bed activity   Lying to sitting edge of bed assist level: Total Assistance - Patient < 25%     Care Tool Transfers Sit to stand transfer Sit to stand activity did not occur: Safety/medical concerns      Chair/bed transfer   Chair/bed transfer assist level: 2 Public relations account executive transfer activity did not occur: Safety/medical concerns        Care Tool Locomotion Ambulation Ambulation activity did not occur: Safety/medical concerns        Walk 10 feet activity Walk 10 feet activity did not occur: Safety/medical concerns       Walk 50 feet with 2 turns activity Walk 50 feet with 2 turns activity did not occur: Safety/medical concerns      Walk 150 feet activity Walk 150 feet activity did not occur: Safety/medical concerns      Walk 10 feet on uneven surfaces activity Walk 10 feet on uneven surfaces activity did not occur: Safety/medical concerns      Stairs Stair activity did not occur: Safety/medical concerns        Walk up/down 1 step activity Walk up/down 1 step or curb (drop down) activity did not occur: Safety/medical concerns     Walk up/down 4 steps activity did not occuR: Safety/medical concerns  Walk up/down 4 steps activity      Walk up/down 12 steps activity Walk up/down 12 steps activity did not occur: Safety/medical concerns      Pick up small objects from floor Pick up small object from the floor (from standing position) activity did not occur: Safety/medical concerns      Wheelchair     Wheelchair activity did not occur: Safety/medical concerns      Wheel 50 feet with 2 turns activity Wheelchair 50 feet with 2 turns activity did not occur: Safety/medical concerns    Wheel 150 feet activity Wheelchair 150 feet activity did not occur: Safety/medical concerns      Refer to Care Plan for Long  Term Goals  SHORT TERM GOAL WEEK 1 PT Short Term Goal 1 (Week 1): Pt will perform bed mobiltiy with max assist  of 1. PT Short Term Goal 2 (Week 1): Pt will initiate WC mobility training. PT Short Term Goal 3 (Week 1): Pt will maintain sitting balance EOB with mod assist up to 5 minutes PT Short Term Goal 4 (Week 1): Pt will transfer to [facility] with max assist of 1 consistently  Recommendations for other services: None   Skilled Therapeutic Intervention  Pt received supine in bed and agreeable to PT. Supine>sit transfer with total assist and  cues for midline. Once EOB pt performed sitting balance with knee of bed elevated to faciliated RS weight shift and mod assist from PT . PT instructed patient in PT Evaluation and initiated treatment intervention; see above for results. PT educated patient in [place], rehab potential, rehab goals, and discharge recommendations along with recommendation for follow-up rehabilitation services.  Squat pivot transfer to [facility] with total A and +2 present as listed below. Sit<>stand at rail in hall with max-total A and use of mirror for visual feedback. Unable to attain midline in standing to pushers syndrome. Sitting balance EOB with mon-max assist x 5 minutes with cues for midline and anterior weight shift.  WB through the R elbow to reduce pushing response.  Pt returned to room and performed SB transfer to bed with total A +2. Sit>supine completed with total A and left supine in bed with call bell in reach and all needs met.      Mobility Bed Mobility Bed Mobility: Rolling Right;Rolling Left;Sit to Supine;Supine to Sit Rolling Right: Total Assistance - Patient < 25% Rolling Left: Maximal Assistance - Patient 25-49% Supine to Sit: Total Assistance - Patient < 25% Sit to Supine: Total Assistance - Patient < 25% Transfers Transfers: Sit to [employer] Transfers Sit to Stand: Total Assistance - Patient < 25% (rail in hall) Set designer Transfers: 2  Helpers Locomotion  Gait Ambulation: Yes Gait Assistance: 2 Helpers Gait Distance (Feet): 3 Feet Assistive device: Other (Comment) (rail in hall) Gait Gait: Yes Gait Pattern: Impaired Gait Pattern: Lateral trunk lean to left;Left flexed knee in stance;Abducted- right (pushers syndrome) Stairs / Additional Locomotion Stairs: No Wheelchair Mobility Wheelchair Mobility: No   Discharge Criteria: Patient will be discharged from PT if patient refuses treatment 3 consecutive times without medical reason, if treatment goals not met, if there is a change in medical status, if patient makes no progress towards goals or if patient is discharged from hospital.  The above assessment, treatment plan, treatment alternatives and goals were discussed and mutually agreed upon: by patient  Lorie Phenix 05/21/2021, 2:53 PM

## 2021-05-22 DIAGNOSIS — E440 Moderate protein-calorie malnutrition: Secondary | ICD-10-CM

## 2021-05-22 NOTE — Progress Notes (Signed)
Physical Therapy Session Note  Patient Details  Name: Donald [name] MRN: [id number] Date of Birth: 1956/11/10  Today's Date: 05/22/2021 PT Individual Time: 1355-1440 and 1700-1738 PT Individual Time Calculation (min): 45 min  and 38 min   Short Term Goals: Week 1:  PT Short Term Goal 1 (Week 1): Pt will perform bed mobiltiy with max assist  of 1. PT Short Term Goal 2 (Week 1): Pt will initiate WC mobility training. PT Short Term Goal 3 (Week 1): Pt will maintain sitting balance EOB with mod assist up to 5 minutes PT Short Term Goal 4 (Week 1): Pt will transfer to [facility] with max assist of 1 consistently  Skilled Therapeutic Interventions/Progress Updates:   Session 1. Pt received supine in bed asleep. Aroused with verbal stimuli, but does not open eyes. Pt agreeable to PT. Supine>sit transfer with total A with knees of bed elevated to force weight shift to the R and reduce pushers response. Pt forced to WC through R elbow with R hand supinated to limit pushers response. Squat pivot transfer with total A +2 with bobath facilitation. once in [facility], pt noted to fall asleep. Only mild arousal to all questions and tactile stimulation throughout remainder of treatment session. Attempted to have pt transfer to 16x16 WC with 4 inch contour back to improve sitting balance and postural control, but pt unable to initiate movement to transfer to open eyes to engage in sitting balance in WC. Pt returned to room and performed +2 dependent transfer to bed. Sit>supine completed with +2 total A, and left supine in bed with call bell in reach and all needs met.   Session 2.      Pt received supine in bed and agreeable to PT. Wife present assisting with improved attention to task and arousal throughout session. Supine>sit transfer with total A and max cues for log roll technique and awareness of midline once EOB. PT instructed pt in sitting balance training EOB. Sustained sitting in midline with RUE placed at end range  x 4 minutes. Partial trunk flexion extension for AP weight shifting in sitting for 15 deg each direction with max assist x 10 each. Lateral weight shift x 8 each with max assist to facilitate weight shift beyond midline to the R. WB of RUE on 3 pillows through elbow with wrist supinated x 4 minutes. Pt engaged in lateral reach and cross body reach to touch/high-five therapist hand x 4 each direction with the RUE. Pt able to weight shift over the R hip with lateral reach for high five. Sit>supine completed with total A +2 and left supine in bed with call bell in reach and all needs met.        Therapy Documentation Precautions:  Precautions Precautions: Fall Precaution Comments: L hemiparesis, L inattention, pusher to the L, cognitive deficits Restrictions Weight Bearing Restrictions: No General: PT Amount of Missed Time (min): 15 Minutes PT Missed Treatment Reason: Patient fatigue Vital Signs: Therapy Vitals Temp: 97.9 F (36.6 C) Temp Source: Oral Pulse Rate: 98 Resp: 18 BP: 132/82 Patient Position (if appropriate): Lying Oxygen Therapy SpO2: 100 % O2 Device: Room Air Pain: denies    Therapy/Group: Individual Therapy  Lorie Phenix 05/22/2021, 4:52 PM

## 2021-05-22 NOTE — Progress Notes (Signed)
Occupational Therapy Session Note  Patient Details  Name: Donald [name] MRN: [id number] Date of Birth: 1956-11-15  Today's Date: 05/22/2021 OT Individual Time: 4660-5637 OT Individual Time Calculation (min): 31 min  and Today's Date: 05/22/2021 OT Missed Time: 29 Minutes + 30 min Missed Time Reason: Patient fatigue    Short Term Goals: Week 1:  OT Short Term Goal 1 (Week 1): Pt will maintain static sitting balance with max A of 1 in prep for seated ADL. OT Short Term Goal 2 (Week 1): Pt will completed grooming task with mod A in supported sitting. OT Short Term Goal 3 (Week 1): Pt will don shirt with max A in supported sitting. OT Short Term Goal 4 (Week 1): Pt will maintain midline orientation in supported sitting with overall mod A.  Skilled Therapeutic Interventions/Progress Updates:    Session 1: Pt received asleep in bed, awakens withs mod VCs, agreeable to therapy. Grimaces and gestures towards his stomach during supine > sitting EOB with total A. Resolves post transition and unable to further elaborate. Session focus on alertness, static sitting balance, and midline orientation in prep for improved ADL performance. Demonstrates moderate to severe pushing to the L with L lean/L head tilt. Req consistent mod to max A throughout to maintain static sitting balance + midline orientation. Pt intermittently answers questions throughout session, but eyes remain closed. Noted difficulty this morning opening his R eye. Pt's midline orientation improved moderately with heavy manual facilitation, but pt is unable to efficiently self-correct despite max verbal and visual cues. Attempting to keep pt alert with conservation, pt appropriately answering questions about his family, but becomes increasingly more difficult to rouse. Returned to supine with total A + 2. Pt noted to immediately fall asleep and stop answer questions. Pt missed 29 min of OT 2/2 fatigue + decreased alertness.   Pt left  semi-reclined in bed with hemi-side protected ,bed alarm engaged, call bell in reach, and all immediate needs met.    Session 2: Pt received asleep in bed, eyes closed. Responding intermittently to questions, but cont to demonstrate minimal alertness. No s/sx pain. Repositioned in bed to facilitate alertness with no success. Pt missed 30  min of OT 2/2 fatigue + decreased alertness. Will attempt to resee as schedule and + 2 assistance allows.  Pt left semi-reclined in bed with hemi-side protected ,bed alarm engaged, call bell in reach, and all immediate needs met.    Therapy Documentation Precautions:  Precautions Precautions: Fall Precaution Comments: L hemiparesis, L inattention, pusher to the L, cognitive deficits Restrictions Weight Bearing Restrictions: No  Pain: see session notes   ADL: See Care Tool for more details.   Therapy/Group: Individual Therapy  Volanda Napoleon MS, OTR/L   05/22/2021, 6:36 AM

## 2021-05-22 NOTE — Progress Notes (Signed)
PROGRESS NOTE   Subjective/Complaints:  Pt said "fed self"- per NT_ wasn't able to feed self- ate some oatmeal and drank some fluids- denies pain- remembered LBM yesterday-   R-.limited due to cognition Objective:   DG Abd 1 View  Result Date: 05/20/2021 CLINICAL DATA:  Abdominal pain. EXAM: ABDOMEN - 1 VIEW COMPARISON:  None. FINDINGS: Large amount of intestinal gas throughout small and large bowel suggesting ileus. There does not appear to be a large amount of fecal matter. No focal obstruction is evident. No free air. No abnormal calcifications or significant bone findings. IMPRESSION: Large amount of intestinal gas suggesting ileus. Electronically Signed   By: Paulina Fusi M.D.   On: 05/20/2021 19:25   Recent Labs    05/21/21 0532  WBC 8.7  HGB 16.2  HCT 47.5  PLT 292   Recent Labs    05/21/21 0532  NA 134*  K 4.3  CL 95*  CO2 29  GLUCOSE 133*  BUN 20  CREATININE 1.20  CALCIUM 10.0    Intake/Output Summary (Last 24 hours) at 05/22/2021 1520 Last data filed at 05/22/2021 0759 Gross per 24 hour  Intake 120 ml  Output --  Net 120 ml        Physical Exam: Vital Signs Blood pressure 132/82, pulse 98, temperature 97.9 F (36.6 C), temperature source Oral, resp. rate 18, height 5\' 5"  (1.651 m), weight 66 kg, SpO2 100 %.   General: awake, alert, but poor initiation/slightly confused; NAD HENT: R eye closed; oropharynx moist CV: regular rate; no JVD Pulmonary: CTA B/L; no W/R/R- good air movement GI: soft, NT, ND, (+)BS Psychiatric: very flat Neurological: Ox1. More alert; but flat  Skin: No evidence of breakdown, no evidence of rash Neurologic: Cranial nerves II through XII intact, motor strength is 5/5 in RIgh tand 0/5 left deltoid, bicep, tricep, grip, hip flexor, knee extensors, ankle dorsiflexor and plantar flexor Sensory exam absent pinch L hand and toes  Cerebellar exam normal finger to nose to finger  as well as heel to shin in bilateral upper and lower extremities Musculoskeletal: Full range of motion in all 4 extremities. No joint swelling    Assessment/Plan: 1. Functional deficits which require 3+ hours per day of interdisciplinary therapy in a comprehensive inpatient rehab setting. Physiatrist is providing close team supervision and 24 hour management of active medical problems listed below. Physiatrist and rehab team continue to assess barriers to discharge/monitor patient progress toward functional and medical goals  Care Tool:  Bathing        Body parts bathed by helper: Right arm, Left arm, Left lower leg, Face, Chest, Abdomen, Front perineal area, Buttocks, Right upper leg, Left upper leg, Right lower leg     Bathing assist Assist Level: 2 Helpers     Upper Body Dressing/Undressing Upper body dressing   What is the patient wearing?: Hospital gown only, Pull over shirt    Upper body assist Assist Level: 2 Helpers    Lower Body Dressing/Undressing Lower body dressing      What is the patient wearing?: Pants, Incontinence brief     Lower body assist Assist for lower body dressing: 2 Helpers  Toileting Toileting    Toileting assist Assist for toileting: 2 Helpers     Transfers Chair/bed transfer  Transfers assist  Chair/bed transfer activity did not occur: Safety/medical concerns  Chair/bed transfer assist level: 2 Helpers     Locomotion Ambulation   Ambulation assist   Ambulation activity did not occur: Safety/medical concerns          Walk 10 feet activity   Assist  Walk 10 feet activity did not occur: Safety/medical concerns        Walk 50 feet activity   Assist Walk 50 feet with 2 turns activity did not occur: Safety/medical concerns         Walk 150 feet activity   Assist Walk 150 feet activity did not occur: Safety/medical concerns         Walk 10 feet on uneven surface  activity   Assist Walk 10 feet on  uneven surfaces activity did not occur: Safety/medical concerns         Wheelchair     Assist     Wheelchair activity did not occur: Safety/medical concerns         Wheelchair 50 feet with 2 turns activity    Assist    Wheelchair 50 feet with 2 turns activity did not occur: Safety/medical concerns       Wheelchair 150 feet activity     Assist  Wheelchair 150 feet activity did not occur: Safety/medical concerns       Blood pressure 132/82, pulse 98, temperature 97.9 F (36.6 C), temperature source Oral, resp. rate 18, height 5\' 5"  (1.651 m), weight 66 kg, SpO2 100 %.  Medical Problem List and Plan: 1.  Decline in Mobilit yand ADL secondary to RIght frontal IPH d/t HTN             -patient may  shower, PRAFO for LLE, rehab evals today              -ELOS/Goals: 20-22d, min assist PT,OT and supervision to min assist with SLP   Con't PT, OT, and SLP    2.  Antithrombotics: -DVT/anticoagulation:  Pharmaceutical: Heparin             -antiplatelet therapy: N/A due to bleed  3. Pain Management: Tylenol prn  6/11- denies pain- con't regimen 4. Mood: LCSW to follow for evaluation and support.              -antipsychotic agents: N/a 5. Neuropsych: This patient is not capable of making decisions on his own behalf. 6. Skin/Wound Care: Routine pressure relief measures.  7. Fluids/Electrolytes/Nutrition: Monitor I/O. Check lytes in am. 8. OSA: Was not compliant with CPAP 9. Hyponatremia: Will recheck lytes in am.borderline 134 , no tx needed  10. HTN: Monitor BP tid--continue Microzide daily. Vitals:   05/22/21 0402 05/22/21 1506  BP: 124/80 132/82  Pulse: 98 98  Resp: 18 18  Temp: 98 F (36.7 C) 97.9 F (36.6 C)  SpO2: 100% 100%    6/11- controlled- con't regimen 11. Prediabetes: Hgb A1c-5.8. Blood sugars have been reasonable in 100-120 range. Continue Carb Modified restrictions.no need for CBG   12. Dyslipidemia: Statin held due to bleed--resume at  discharge. 13. Constipation: had large bm 6/8, BM x 2 6/9, KUB suggesting ileus but exam with + bowel sounds , no tenderness, no need for diet modification at this time   6/11- LBM yesterday per pt and NT_ con't to monitor.     LOS: 2 days A FACE  TO FACE EVALUATION WAS PERFORMED  Donald [name] 05/22/2021, 3:20 PM

## 2021-05-23 ENCOUNTER — Inpatient Hospital Stay (HOSPITAL_COMMUNITY)
Admission: AC | Admit: 2021-05-23 | Discharge: 2021-06-04 | DRG: 064 | Disposition: A | Discharge status: Rehabilitation Facility | Payer: No Typology Code available for payment source | Source: Intra-hospital | Attending: Family Medicine | Admitting: Family Medicine

## 2021-05-23 ENCOUNTER — Inpatient Hospital Stay (HOSPITAL_COMMUNITY): Payer: No Typology Code available for payment source

## 2021-05-23 DIAGNOSIS — E878 Other disorders of electrolyte and fluid balance, not elsewhere classified: Secondary | ICD-10-CM | POA: Diagnosis present

## 2021-05-23 DIAGNOSIS — D72828 Other elevated white blood cell count: Secondary | ICD-10-CM | POA: Diagnosis not present

## 2021-05-23 DIAGNOSIS — D62 Acute posthemorrhagic anemia: Secondary | ICD-10-CM | POA: Diagnosis not present

## 2021-05-23 DIAGNOSIS — G9341 Metabolic encephalopathy: Secondary | ICD-10-CM

## 2021-05-23 DIAGNOSIS — F0391 Unspecified dementia with behavioral disturbance: Secondary | ICD-10-CM | POA: Diagnosis present

## 2021-05-23 DIAGNOSIS — R739 Hyperglycemia, unspecified: Secondary | ICD-10-CM

## 2021-05-23 DIAGNOSIS — R7303 Prediabetes: Secondary | ICD-10-CM | POA: Diagnosis not present

## 2021-05-23 DIAGNOSIS — R509 Fever, unspecified: Secondary | ICD-10-CM | POA: Diagnosis not present

## 2021-05-23 DIAGNOSIS — E440 Moderate protein-calorie malnutrition: Secondary | ICD-10-CM | POA: Diagnosis not present

## 2021-05-23 DIAGNOSIS — E785 Hyperlipidemia, unspecified: Secondary | ICD-10-CM | POA: Diagnosis present

## 2021-05-23 DIAGNOSIS — E871 Hypo-osmolality and hyponatremia: Secondary | ICD-10-CM | POA: Diagnosis not present

## 2021-05-23 DIAGNOSIS — G8112 Spastic hemiplegia affecting left dominant side: Secondary | ICD-10-CM | POA: Diagnosis not present

## 2021-05-23 DIAGNOSIS — D72829 Elevated white blood cell count, unspecified: Secondary | ICD-10-CM

## 2021-05-23 DIAGNOSIS — I629 Nontraumatic intracranial hemorrhage, unspecified: Secondary | ICD-10-CM

## 2021-05-23 DIAGNOSIS — I82411 Acute embolism and thrombosis of right femoral vein: Secondary | ICD-10-CM | POA: Diagnosis not present

## 2021-05-23 DIAGNOSIS — R1312 Dysphagia, oropharyngeal phase: Secondary | ICD-10-CM | POA: Diagnosis not present

## 2021-05-23 DIAGNOSIS — E870 Hyperosmolality and hypernatremia: Secondary | ICD-10-CM | POA: Diagnosis present

## 2021-05-23 DIAGNOSIS — G8194 Hemiplegia, unspecified affecting left nondominant side: Secondary | ICD-10-CM | POA: Diagnosis present

## 2021-05-23 DIAGNOSIS — I612 Nontraumatic intracerebral hemorrhage in hemisphere, unspecified: Secondary | ICD-10-CM | POA: Diagnosis not present

## 2021-05-23 DIAGNOSIS — E873 Alkalosis: Secondary | ICD-10-CM | POA: Diagnosis present

## 2021-05-23 DIAGNOSIS — G936 Cerebral edema: Secondary | ICD-10-CM | POA: Diagnosis present

## 2021-05-23 DIAGNOSIS — M898X9 Other specified disorders of bone, unspecified site: Secondary | ICD-10-CM | POA: Diagnosis not present

## 2021-05-23 DIAGNOSIS — I611 Nontraumatic intracerebral hemorrhage in hemisphere, cortical: Secondary | ICD-10-CM

## 2021-05-23 DIAGNOSIS — Z95828 Presence of other vascular implants and grafts: Secondary | ICD-10-CM | POA: Diagnosis not present

## 2021-05-23 DIAGNOSIS — Z4659 Encounter for fitting and adjustment of other gastrointestinal appliance and device: Secondary | ICD-10-CM

## 2021-05-23 DIAGNOSIS — R4701 Aphasia: Secondary | ICD-10-CM | POA: Diagnosis present

## 2021-05-23 DIAGNOSIS — R29810 Facial weakness: Secondary | ICD-10-CM | POA: Diagnosis present

## 2021-05-23 DIAGNOSIS — R1310 Dysphagia, unspecified: Secondary | ICD-10-CM | POA: Diagnosis present

## 2021-05-23 DIAGNOSIS — R7401 Elevation of levels of liver transaminase levels: Secondary | ICD-10-CM | POA: Diagnosis not present

## 2021-05-23 DIAGNOSIS — I82412 Acute embolism and thrombosis of left femoral vein: Secondary | ICD-10-CM | POA: Diagnosis present

## 2021-05-23 DIAGNOSIS — R0600 Dyspnea, unspecified: Secondary | ICD-10-CM

## 2021-05-23 DIAGNOSIS — Z8249 Family history of ischemic heart disease and other diseases of the circulatory system: Secondary | ICD-10-CM | POA: Diagnosis not present

## 2021-05-23 DIAGNOSIS — G300 Alzheimer's disease with early onset: Secondary | ICD-10-CM

## 2021-05-23 DIAGNOSIS — H539 Unspecified visual disturbance: Secondary | ICD-10-CM | POA: Diagnosis not present

## 2021-05-23 DIAGNOSIS — D638 Anemia in other chronic diseases classified elsewhere: Secondary | ICD-10-CM | POA: Diagnosis not present

## 2021-05-23 DIAGNOSIS — I609 Nontraumatic subarachnoid hemorrhage, unspecified: Secondary | ICD-10-CM | POA: Diagnosis present

## 2021-05-23 DIAGNOSIS — J189 Pneumonia, unspecified organism: Secondary | ICD-10-CM | POA: Diagnosis not present

## 2021-05-23 DIAGNOSIS — I69154 Hemiplegia and hemiparesis following nontraumatic intracerebral hemorrhage affecting left non-dominant side: Secondary | ICD-10-CM | POA: Diagnosis not present

## 2021-05-23 DIAGNOSIS — A419 Sepsis, unspecified organism: Secondary | ICD-10-CM | POA: Diagnosis not present

## 2021-05-23 DIAGNOSIS — R569 Unspecified convulsions: Secondary | ICD-10-CM | POA: Diagnosis present

## 2021-05-23 DIAGNOSIS — J690 Pneumonitis due to inhalation of food and vomit: Secondary | ICD-10-CM | POA: Diagnosis not present

## 2021-05-23 DIAGNOSIS — Z79899 Other long term (current) drug therapy: Secondary | ICD-10-CM

## 2021-05-23 DIAGNOSIS — R6520 Severe sepsis without septic shock: Secondary | ICD-10-CM | POA: Diagnosis not present

## 2021-05-23 DIAGNOSIS — Z515 Encounter for palliative care: Secondary | ICD-10-CM | POA: Diagnosis not present

## 2021-05-23 DIAGNOSIS — I82413 Acute embolism and thrombosis of femoral vein, bilateral: Secondary | ICD-10-CM | POA: Diagnosis not present

## 2021-05-23 DIAGNOSIS — Z6823 Body mass index (BMI) 23.0-23.9, adult: Secondary | ICD-10-CM

## 2021-05-23 DIAGNOSIS — E876 Hypokalemia: Secondary | ICD-10-CM | POA: Diagnosis not present

## 2021-05-23 DIAGNOSIS — R638 Other symptoms and signs concerning food and fluid intake: Secondary | ICD-10-CM | POA: Diagnosis not present

## 2021-05-23 DIAGNOSIS — N179 Acute kidney failure, unspecified: Secondary | ICD-10-CM | POA: Diagnosis present

## 2021-05-23 DIAGNOSIS — G4733 Obstructive sleep apnea (adult) (pediatric): Secondary | ICD-10-CM | POA: Diagnosis present

## 2021-05-23 DIAGNOSIS — I619 Nontraumatic intracerebral hemorrhage, unspecified: Secondary | ICD-10-CM | POA: Insufficient documentation

## 2021-05-23 DIAGNOSIS — F0280 Dementia in other diseases classified elsewhere without behavioral disturbance: Secondary | ICD-10-CM

## 2021-05-23 DIAGNOSIS — R4189 Other symptoms and signs involving cognitive functions and awareness: Secondary | ICD-10-CM | POA: Diagnosis present

## 2021-05-23 DIAGNOSIS — M1990 Unspecified osteoarthritis, unspecified site: Secondary | ICD-10-CM | POA: Diagnosis present

## 2021-05-23 DIAGNOSIS — G932 Benign intracranial hypertension: Secondary | ICD-10-CM | POA: Diagnosis present

## 2021-05-23 DIAGNOSIS — I10 Essential (primary) hypertension: Secondary | ICD-10-CM | POA: Diagnosis present

## 2021-05-23 DIAGNOSIS — I610 Nontraumatic intracerebral hemorrhage in hemisphere, subcortical: Secondary | ICD-10-CM | POA: Diagnosis not present

## 2021-05-23 DIAGNOSIS — S06349S Traumatic hemorrhage of right cerebrum with loss of consciousness of unspecified duration, sequela: Secondary | ICD-10-CM | POA: Diagnosis not present

## 2021-05-23 DIAGNOSIS — K560 Paralytic ileus: Secondary | ICD-10-CM | POA: Diagnosis not present

## 2021-05-23 DIAGNOSIS — Z7189 Other specified counseling: Secondary | ICD-10-CM | POA: Diagnosis not present

## 2021-05-23 DIAGNOSIS — F0390 Unspecified dementia without behavioral disturbance: Secondary | ICD-10-CM | POA: Diagnosis not present

## 2021-05-23 DIAGNOSIS — Z882 Allergy status to sulfonamides status: Secondary | ICD-10-CM

## 2021-05-23 DIAGNOSIS — R54 Age-related physical debility: Secondary | ICD-10-CM | POA: Diagnosis not present

## 2021-05-23 DIAGNOSIS — S06340S Traumatic hemorrhage of right cerebrum without loss of consciousness, sequela: Secondary | ICD-10-CM | POA: Diagnosis not present

## 2021-05-23 DIAGNOSIS — K567 Ileus, unspecified: Secondary | ICD-10-CM | POA: Diagnosis not present

## 2021-05-23 LAB — GLUCOSE, CAPILLARY: Glucose-Capillary: 157 mg/dL — ABNORMAL HIGH (ref 70–99)

## 2021-05-23 MED ORDER — LORAZEPAM 2 MG/ML IJ SOLN
INTRAMUSCULAR | Status: AC
  Filled 2021-05-23: qty 1

## 2021-05-23 MED ORDER — LORAZEPAM 2 MG/ML IJ SOLN: 2.0000 mg | Freq: Once | INTRAMUSCULAR | Status: DC

## 2021-05-23 MED ORDER — LEVETIRACETAM IN NACL 1000 MG/100ML IV SOLN
1000.0000 mg | Freq: Two times a day (BID) | INTRAVENOUS | Status: DC
  Administered 2021-05-24 – 2021-05-25 (×4): 1000 mg via INTRAVENOUS
  Filled 2021-05-23 (×2): qty 100

## 2021-05-23 MED ORDER — LEVETIRACETAM IN NACL 500 MG/100ML IV SOLN: 500.0000 mg | Freq: Two times a day (BID) | INTRAVENOUS | Status: DC

## 2021-05-23 MED ORDER — LORAZEPAM 2 MG/ML IJ SOLN
2.0000 mg | Freq: Once | INTRAMUSCULAR | Status: AC
  Administered 2021-05-23: 2 mg via INTRAMUSCULAR

## 2021-05-23 MED ORDER — IOHEXOL 350 MG/ML SOLN
50.0000 mL | Freq: Once | INTRAVENOUS | Status: AC | PRN
  Administered 2021-05-23: 50 mL via INTRAVENOUS

## 2021-05-23 MED ORDER — SODIUM CHLORIDE 0.9 % IV SOLN
2000.0000 mg | Freq: Once | INTRAVENOUS | Status: AC
  Administered 2021-05-23: 2000 mg via INTRAVENOUS
  Filled 2021-05-23: qty 20

## 2021-05-23 MED ORDER — ACETAMINOPHEN 650 MG RE SUPP
650.0000 mg | RECTAL | Status: DC | PRN
  Administered 2021-05-23: 650 mg via RECTAL

## 2021-05-23 MED ORDER — METOPROLOL TARTRATE 5 MG/5ML IV SOLN: 5.0000 mg | Freq: Four times a day (QID) | INTRAVENOUS | Status: DC | PRN

## 2021-05-23 MED ORDER — CLEVIDIPINE BUTYRATE 0.5 MG/ML IV EMUL: 0.0000 mg/h | INTRAVENOUS | Status: DC

## 2021-05-23 MED ORDER — LEVETIRACETAM IN NACL 1000 MG/100ML IV SOLN
1000.0000 mg | INTRAVENOUS | Status: DC
Start: 2021-05-23 — End: 2021-05-23
  Administered 2021-05-23 (×2): 1000 mg via INTRAVENOUS
  Filled 2021-05-23 (×2): qty 100

## 2021-05-23 MED ORDER — LORAZEPAM 2 MG/ML IJ SOLN
INTRAMUSCULAR | Status: AC
  Administered 2021-05-23: 2 mg
  Filled 2021-05-23: qty 1

## 2021-05-23 MED ORDER — LORAZEPAM 2 MG/ML IJ SOLN: 1.0000 mg | Freq: Once | INTRAMUSCULAR | Status: DC

## 2021-05-23 NOTE — Progress Notes (Addendum)
PROGRESS NOTE   Subjective/Complaints:  Pt is too sedated Spoke with NT- pt wouldn't feed self or drink like yesterday- fluid dribbling out his mouth- wouldn't swallow.  Also wouldn't wake easily - actually very little at all- woke briefly to sternal rub.   R-.limited due to sedation/cognition Objective:   CT HEAD CODE STROKE WO CONTRAST  Result Date: 05/23/2021 CLINICAL DATA:  Code stroke.  Acute stroke. EXAM: CT HEAD WITHOUT CONTRAST TECHNIQUE: Contiguous axial images were obtained from the base of the skull through the vertex without intravenous contrast. COMPARISON:  Head CT from 05/11/2021 FINDINGS: Brain: 5.5 x 2.8 cm hematoma in the subcortical high right frontal region with extensive vasogenic edema in the adjacent brain that is progressed. There is worsening local mass effect with midline shift now measuring 13 mm. No entrapment. No cytotoxic edema seen. Vascular: No hyperdense vessel or unexpected calcification. Skull: Normal. Negative for fracture or focal lesion. Sinuses/Orbits: Negative Other: Critical Value/emergent results were called by telephone at the time of interpretation on 05/23/2021 at 9:10 am to provider [facility] , who verbally acknowledged these results. ASPECTS [facility] Stroke Program Early CT Score) Not scored in this setting IMPRESSION: Progressed right frontal hematoma and vasogenic edema with midline shift now measuring 13 mm. Electronically Signed   By: Marnee Spring M.D.   On: 05/23/2021 09:12   Recent Labs    05/21/21 0532  WBC 8.7  HGB 16.2  HCT 47.5  PLT 292   Recent Labs    05/21/21 0532  NA 134*  K 4.3  CL 95*  CO2 29  GLUCOSE 133*  BUN 20  CREATININE 1.20  CALCIUM 10.0    Intake/Output Summary (Last 24 hours) at 05/23/2021 0931 Last data filed at 05/23/2021 0800 Gross per 24 hour  Intake 340 ml  Output 225 ml  Net 115 ml        Physical Exam: Vital Signs Blood pressure  119/89, pulse (!) 109, temperature 98.8 F (37.1 C), temperature source Oral, resp. rate 16, height 5\' 5"  (1.651 m), weight 66 kg, SpO2 100 %.    General: asleep- woke slightly to sternal rub only NAD HENT: conjugate gaze; oropharynx moist CV: regular  rhythm; tachycardic rate; no JVD Pulmonary: coarse with a few rhonchi GI: soft, NT, ND, (+)BS Psychiatric: asleep/sedated Neurological: asleep/lethargic- won't wake much at all  Skin: No evidence of breakdown, no evidence of rash Neurologic: Cranial nerves II through XII intact, motor strength is 5/5 in RIgh tand 0/5 left deltoid, bicep, tricep, grip, hip flexor, knee extensors, ankle dorsiflexor and plantar flexor Sensory exam absent pinch L hand and toes  Cerebellar exam normal finger to nose to finger as well as heel to shin in bilateral upper and lower extremities Musculoskeletal: Full range of motion in all 4 extremities. No joint swelling    Assessment/Plan: 1. Functional deficits which require 3+ hours per day of interdisciplinary therapy in a comprehensive inpatient rehab setting. Physiatrist is providing close team supervision and 24 hour management of active medical problems listed below. Physiatrist and rehab team continue to assess barriers to discharge/monitor patient progress toward functional and medical goals  Care Tool:  Bathing  Body parts bathed by helper: Right arm, Left arm, Left lower leg, Face, Chest, Abdomen, Front perineal area, Buttocks, Right upper leg, Left upper leg, Right lower leg     Bathing assist Assist Level: 2 Helpers     Upper Body Dressing/Undressing Upper body dressing   What is the patient wearing?: Hospital gown only, Pull over shirt    Upper body assist Assist Level: 2 Helpers    Lower Body Dressing/Undressing Lower body dressing      What is the patient wearing?: Pants, Incontinence brief     Lower body assist Assist for lower body dressing: 2 Helpers      Toileting Toileting    Toileting assist Assist for toileting: 2 Helpers     Transfers Chair/bed transfer  Transfers assist  Chair/bed transfer activity did not occur: Safety/medical concerns  Chair/bed transfer assist level: 2 Helpers     Locomotion Ambulation   Ambulation assist   Ambulation activity did not occur: Safety/medical concerns          Walk 10 feet activity   Assist  Walk 10 feet activity did not occur: Safety/medical concerns        Walk 50 feet activity   Assist Walk 50 feet with 2 turns activity did not occur: Safety/medical concerns         Walk 150 feet activity   Assist Walk 150 feet activity did not occur: Safety/medical concerns         Walk 10 feet on uneven surface  activity   Assist Walk 10 feet on uneven surfaces activity did not occur: Safety/medical concerns         Wheelchair     Assist     Wheelchair activity did not occur: Safety/medical concerns         Wheelchair 50 feet with 2 turns activity    Assist    Wheelchair 50 feet with 2 turns activity did not occur: Safety/medical concerns       Wheelchair 150 feet activity     Assist  Wheelchair 150 feet activity did not occur: Safety/medical concerns       Blood pressure 119/89, pulse (!) 109, temperature 98.8 F (37.1 C), temperature source Oral, resp. rate 16, height 5\' 5"  (1.651 m), weight 66 kg, SpO2 100 %.  Medical Problem List and Plan: 1.  Decline in Mobilit yand ADL secondary to RIght frontal IPH d/t HTN             -patient may  shower, PRAFO for LLE, rehab evals today              -ELOS/Goals: 20-22d, min assist PT,OT and supervision to min assist with SLP   Con't PT, OT, and SLP    2.  Antithrombotics: -DVT/anticoagulation:  Pharmaceutical: Heparin             -antiplatelet therapy: N/A due to bleed  3. Pain Management: Tylenol prn  6/11- denies pain- con't regimen 4. Mood: LCSW to follow for evaluation and  support.              -antipsychotic agents: N/a 5. Neuropsych: This patient is not capable of making decisions on his own behalf. 6. Skin/Wound Care: Routine pressure relief measures.  7. Fluids/Electrolytes/Nutrition: Monitor I/O. Check lytes in am. 8. OSA: Was not compliant with CPAP 9. Hyponatremia: Will recheck lytes in am.borderline 134 , no tx needed  10. HTN: Monitor BP tid--continue Microzide daily. Vitals:   05/23/21 0514 05/23/21 0838  BP: 07/23/21)  142/80 119/89  Pulse: (!) 110 (!) 109  Resp: 20 16  Temp: 98.1 F (36.7 C) 98.8 F (37.1 C)  SpO2: 100% 100%    6/11- controlled- con't regimen 11. Prediabetes: Hgb A1c-5.8. Blood sugars have been reasonable in 100-120 range. Continue Carb Modified restrictions.no need for CBG   12. Dyslipidemia: Statin held due to bleed--resume at discharge. 13. Constipation: had large bm 6/8, BM x 2 6/9, KUB suggesting ileus but exam with + bowel sounds , no tenderness, no need for diet modification at this time   6/11- LBM yesterday per pt and NT- con't to monitor.     Pt being transferred to ICU- had code stroke and needs to go to ICU due to increased bleeding in the brain and possible/probable status epilepticus.  Going to ICU 4N26- Dr Selina Cooley will take over as his physican.  Called wife- got voicemail x2 on home phone and mobile phone. L/M- also called daughter- got in touch with her- explained what we knew- that he's had more bleeding in the brain and possible seizures- and needs to go to ICU- also gave her new room number 4N26.    LOS: 3 days A FACE TO FACE EVALUATION WAS PERFORMED  Zamyah Wiesman 05/23/2021, 9:31 AM

## 2021-05-23 NOTE — Progress Notes (Signed)
Pharmacy:  Wasted full vial Ativan 2mg  (1 mL) injection in Pyxis with . Disposed of in stericycle.    [employer], PharmD, BCPS, BCCCP Clinical Pharmacist Please refer to [facility] for [facility] numbers 05/23/2021, 9:49 AM

## 2021-05-23 NOTE — Progress Notes (Signed)
An emergency CT was preformed and patient was discharged from Inpatient Rehab and admitted to 4N26 [facility]. Report was called to Clarita Crane, RN. Patient's belongings were given to patient's wife.

## 2021-05-23 NOTE — Progress Notes (Signed)
EEG complete - results pending 

## 2021-05-23 NOTE — Plan of Care (Signed)
Neurology Plan of Care  Discussed case with Dr. Corliss Skains neuro IR. We are concerned that there may be an underlying AVM that caused the ICH and subsequent rebleed. He recommended that tomorrow we proceed with catheter angiogram to better characterize this. I updated the wife and she is agreeable but notes that she would like to discuss the procedure in more detail tomorrow with Dr. Corliss Skains before the procedure.  IR cerebral angiogram order placed NPO after MN No change to other recommendations from consult note earlier today  Bing Neighbors, MD [facility] [phone]  If 7pm- 7am, please page neurology on call as listed in AMION.

## 2021-05-23 NOTE — Consult Note (Signed)
NEUROLOGY CONSULTATION NOTE   Date of service: May 23, 2021 Patient Name: Donald [name] MRN:  [id number] DOB:  02/19/1956 Reason for consult: acute unresponsiveness in patient admitted with ICH, LKW last night  _ _ _   _ __   _ __ _ _  __ __   _ __   __ _  History of Present Illness   Donald [name] is a 65 y.o. male currently admitted for ICH on 05/11/21. He was transferred to 4W rehab and was found this AM to be minimally responsive with R gaze deviation. Patient lethargic and able to answer first name only but is able to follow simple commands on the left. LKW last night. Serial SBPs in CT scan <130. CTH showed interval progression of R frontal hematoma compared to 05/11/21 w/ worsening mass effect, vasogenic edema and midline shift now measuring 13 mm. Presentation and direction of gaze deviation c/f seizure in addition to extension of ICH therefore 2mg  IM ativan administered. Afterwards patient's aphasia and gaze deviation resolved and he was able to speak normally, asking about the results of the CT scan and asking me to call and update his wife.  Of note, etiology of patient's ICH was not determined and f/u MRI wwo contrast was planned in 6-8 wks to r/o underlying mass lesion. CTA performed during today's stroke code no obvious source of continued bleed on personal review, final radiology read is pending.   ROS   Per HPI  Past History   Past Medical History:  Diagnosis Date   Arthritis    Past Surgical History:  Procedure Laterality Date   APPENDECTOMY     Family History  Problem Relation Age of Onset   Hypertension Mother    Colon polyps Neg Hx    Colon cancer Neg Hx    Esophageal cancer Neg Hx    Rectal cancer Neg Hx    Stomach cancer Neg Hx    Social History   Socioeconomic History   Marital status: Married    Spouse name: Not on file   Number of children: Not on file   Years of education: Not on file   Highest education level: Not on file  Occupational  History   Not on file  Tobacco Use   Smoking status: Never   Smokeless tobacco: Never  Substance and Sexual Activity   Alcohol use: No   Drug use: No   Sexual activity: Yes    Birth control/protection: None  Other Topics Concern   Not on file  Social History Narrative   Not on file   Social Determinants of Health   Financial Resource Strain: Not on file  Food Insecurity: Not on file  Transportation Needs: Not on file  Physical Activity: Not on file  Stress: Not on file  Social Connections: Not on file   Allergies  Allergen Reactions   Sulfa Antibiotics Other (See Comments)    Nausea and eye swelling     Medications   Medications Prior to Admission  Medication Sig Dispense Refill Last Dose   amLODipine (NORVASC) 10 MG tablet Take 1 tablet (10 mg total) by mouth daily.      atorvastatin (LIPITOR) 20 MG tablet Take 1 tablet (20 mg total) by mouth daily.      hydrochlorothiazide (MICROZIDE) 12.5 MG capsule Take 1 capsule (12.5 mg total) by mouth daily.      Multiple Vitamins-Minerals (MENS 50+ MULTI VITAMIN/MIN PO) Take 1 Dose by mouth in the morning and at bedtime.  pantoprazole (PROTONIX) 40 MG tablet Take 1 tablet (40 mg total) by mouth daily.      polyethylene glycol (MIRALAX / GLYCOLAX) 17 g packet Take 17 g by mouth 2 (two) times daily. 14 each 0    senna-docusate (SENOKOT-S) 8.6-50 MG tablet Take 1 tablet by mouth 2 (two) times daily.        Vitals   Vitals:   05/22/21 1506 05/22/21 1944 05/23/21 0514 05/23/21 0838  BP: 132/82 (!) 139/91 (!) 142/80 119/89  Pulse: 98 (!) 103 (!) 110 (!) 109  Resp: 18 16 20 16   Temp: 97.9 F (36.6 C) 98.7 F (37.1 C) 98.1 F (36.7 C) 98.8 F (37.1 C)  TempSrc: Oral Oral Oral Oral  SpO2: 100% 99% 100% 100%  Weight:      Height:         Body mass index is 24.21 kg/m.  Physical Exam   Physical Exam Gen: somnolent, able to answer first name only, able to follow commands on L side only (thumbs up, show 2  fingers) Resp: normal WOB CV: RRR  Neuro MS: somnolent, able to answer first name only, able to follow commands on L side only (thumbs up, show 2 fingers) Speech: mild dysarthria CN: PERRL, blinks to threat bilat, R gaze deviation (will not cross midline on dolls), L UMN facial droop, hearing intact to voice Motor: No movement LUE, some effort against gravity LLE, anti-gravity and following commands RUE and RLE  NIHSS  1a Level of Conscious.: 2 1b LOC Questions: 2 1c LOC Commands: 0 2 Best Gaze: 2 3 Visual: 0 4 Facial Palsy: 1 5a Motor Arm - left: 4 5b Motor Arm - Right: 1 6a Motor Leg - Left: 2 6b Motor Leg - Right: 1 7 Limb Ataxia: 0 8 Sensory: 0 9 Best Language: 1 10 Dysarthria: 1 11 Extinct. and Inatten.: 0  TOTAL: 17   Premorbid mRS = 4   Labs   CBC:  Recent Labs  Lab 05/18/21 0446 05/21/21 0532  WBC 6.6 8.7  NEUTROABS  --  5.4  HGB 17.0 16.2  HCT 50.1 47.5  MCV 88.8 88.3  PLT 257 292    Basic Metabolic Panel:  Lab Results  Component Value Date   NA 134 (L) 05/21/2021   K 4.3 05/21/2021   CO2 29 05/21/2021   GLUCOSE 133 (H) 05/21/2021   BUN 20 05/21/2021   CREATININE 1.20 05/21/2021   CALCIUM 10.0 05/21/2021   GFRNONAA >60 05/21/2021   GFRAA 76 09/11/2020   Lipid Panel:  Lab Results  Component Value Date   LDLCALC 140 (H) 05/11/2021   HgbA1c:  Lab Results  Component Value Date   HGBA1C 5.8 (H) 05/11/2021   Urine Drug Screen:     Component Value Date/Time   LABOPIA NONE DETECTED 05/11/2021 1109   COCAINSCRNUR NONE DETECTED 05/11/2021 1109   LABBENZ NONE DETECTED 05/11/2021 1109   AMPHETMU NONE DETECTED 05/11/2021 1109   THCU NONE DETECTED 05/11/2021 1109   LABBARB NONE DETECTED 05/11/2021 1109    Alcohol Level     Component Value Date/Time   [facility] <10 05/11/2021 1118     Impression   [name] is a 65 y.o. male currently admitted for ICH on 05/11/21 developed somnolence and aphasia with R gaze deviation that resolved  with ativan. CT head shows interval progression of R frontal hematoma now with 66mm midline shift. Patient's sx markedly improved after administration of ativan, acute change in neuro exam 2/2 seizure in the  setting of interval ICH progression  Recommendations   - D/w CCM, they will admit to ICU - Currently normotensive w/o antihypertensives. Goal SBP <140 w/ clevidipine prn - SCDs for DVT prophylaxis - Pharm DVT prophylaxis d/c'd - Head CT q 6 hrs assess stability of ICH - STAT EEG f/b cEEG - S/p keppra load 2g to be f/b keppra 500mg  bid - CTH q 6 hrs assess bleed stability - HOB elevated 30 degrees - No indication for NSU consult this time - I will call wife to update her    This patient is critically ill and at significant risk of neurological worsening, death and care requires constant monitoring of vital signs, hemodynamics,respiratory and cardiac monitoring, neurological assessment, discussion with family, other specialists and medical decision making of high complexity. I spent 90 minutes of neurocritical care time  in the care of  this patient. This was time spent independent of any time provided by nurse practitioner or PA.   , MD [facility] [phone]   If 7pm- 7am, please page neurology on call as listed in AMION.

## 2021-05-23 NOTE — H&P (Signed)
NAME:  Donald [name], MRN:  [id number], DOB:  Apr 24, 1956, LOS: 0 ADMISSION DATE:  05/23/2021, CONSULTATION DATE:  05/23/2021 REFERRING MD:  Admitted from Rehab, CHIEF COMPLAINT:  altered mental status   History of Present Illness:  Donald [name] is a 65 year old gentleman with recent intracranial hemorrhage who presents from rehab with worsening mental status and seizures.  He initially presented May 31 with new onset left-sided hemiparesis, right gaze, and left mild facial droop.  He was found to have acute intracranial hemorrhage of the posterior right frontal lobe.  He was medically managed with blood pressure control and discharged to inpatient rehab.  He had just finished his first day of intensive rehab.  At rehab he was sitting up, eating with assistance, able to talk.  This morning he was being fed breakfast with assistance and his nursing tech noted difficulty swallowing, facial droop, decreased mentation.  Code stroke was called.  CT head shows enlargement of his existing right intraparenchymal hemorrhage, with worsening midline shift.  He was transferred to the 4n ICU for ongoing blood pressure management and serial CT scans.  Of note he was receiving outpatient work-up for early onset Alzheimer's at [facility] prior to this hemorrhage.  Pertinent  Medical History  Hypertension Mild cognitive decline Intracranial hemorrhage  Significant Hospital Events: Including procedures, antibiotic start and stop dates in addition to other pertinent events   6/12 admitted from inpatient rehab for acute worsening of intracranial hemorrhage of the right frontal lobe  Interim History / Subjective:    Objective   Blood pressure 135/77, temperature (!) 97.5 F (36.4 C), temperature source Axillary, resp. rate 18, SpO2 100 %.       No intake or output data in the 24 hours ending 05/23/21 1047 There were no vitals filed for this visit.  Examination: General: Lying flat comfortably, no distress,  resting HENT: Pupils are brisk, equally round and reactive to light, no gaze deviation present Lungs: Clear to auscultation bilaterally, no wheezes or crackles no increased work of breathing Cardiovascular: Tachycardic, regular Abdomen: Soft, nontender Extremities: No edema Neuro: Noxious stimuli, does not awaken to verbal or tactile stimuli, does not follow commands, no seizure-like activity noted but he is having some baseline shivering across trunk and upper extremities GU: No Foley  Labs/imaging that I havepersonally reviewed  (right click and "Reselect all SmartList Selections" daily)  Repeat CT head this morning shows worsening intracranial hemorrhage in the right frontal lobe with worsening midline shift.  Resolved Hospital Problem list     Assessment & Plan:   Donald [name] is 65 year old gentleman with recent intracranial hemorrhage now with worsening mental status changes and rebleeding of his right frontal lobe hemorrhage  Intracranial hemorrhage midline shift of the right frontal lobe Code stroke called inpatient rehab, transferred to 4 N. Plans for blood pressure management systolic blood pressure less than 140 He is due for serial CT scans every 6 hours, Next one is due this afternoon No need for surgical intervention at this time, neurology following closely, Needed to maintain blood pressure N.p.o. for now EEG statin continue as ordered   Best practice (right click and "Reselect all SmartList Selections" daily)  Diet:  NPO Pain/Anxiety/Delirium protocol (if indicated): No VAP protocol (if indicated): Not indicated DVT prophylaxis: SCD GI prophylaxis: N/A Glucose control:  SSI No Central venous access:  N/A Arterial line:  N/A Foley:  N/A Mobility:  bed rest  PT consulted: N/A Last date of multidisciplinary goals of care discussion [discussed with patient's  wife and daughter at the bedside this morning.] Code Status:  full code Disposition: ICU  Labs    CBC: Recent Labs  Lab 05/18/21 0446 05/21/21 0532  WBC 6.6 8.7  NEUTROABS  --  5.4  HGB 17.0 16.2  HCT 50.1 47.5  MCV 88.8 88.3  PLT 257 292    Basic Metabolic Panel: Recent Labs  Lab 05/18/21 0446 05/21/21 0532  NA 134* 134*  K 4.6 4.3  CL 95* 95*  CO2 28 29  GLUCOSE 116* 133*  BUN 22 20  CREATININE 1.23 1.20  CALCIUM 10.1 10.0  MG 2.7*  --    GFR: Estimated Creatinine Clearance: 54.1 mL/min (by C-G formula based on SCr of 1.2 mg/dL). Recent Labs  Lab 05/18/21 0446 05/21/21 0532  WBC 6.6 8.7    Liver Function Tests: Recent Labs  Lab 05/21/21 0532  AST 27  ALT 25  ALKPHOS 100  BILITOT 0.9  PROT 7.7  ALBUMIN 4.0   No results for input(s): LIPASE, AMYLASE in the last 168 hours. No results for input(s): AMMONIA in the last 168 hours.  ABG    Component Value Date/Time   TCO2 23 05/11/2021 0901     Coagulation Profile: No results for input(s): INR, PROTIME in the last 168 hours.  Cardiac Enzymes: No results for input(s): CKTOTAL, CKMB, CKMBINDEX, TROPONINI in the last 168 hours.  HbA1C: Hgb A1c MFr Bld  Date/Time Value Ref Range Status  05/11/2021 11:18 AM 5.8 (H) 4.8 - 5.6 % Final    Comment:    (NOTE)         Prediabetes: 5.7 - 6.4         Diabetes: >6.4         Glycemic control for adults with diabetes: <7.0     CBG: Recent Labs  Lab 05/23/21 0834  GLUCAP 157*    Review of Systems:   Unable to obtain due to mental status  Past Medical History:  He,  has a past medical history of Arthritis.    Surgical History:   Past Surgical History:  Procedure Laterality Date   APPENDECTOMY       Social History:   reports that he has never smoked. He has never used smokeless tobacco. He reports that he does not drink alcohol and does not use drugs.   Family History:  His family history includes Hypertension in his mother. There is no history of Colon polyps, Colon cancer, Esophageal cancer, Rectal cancer, or Stomach cancer.    Allergies Allergies  Allergen Reactions   Sulfa Antibiotics Other (See Comments)    Nausea and eye swelling      Home Medications  Prior to Admission medications   Medication Sig Start Date End Date Taking? Authorizing Provider  amLODipine (NORVASC) 10 MG tablet Take 1 tablet (10 mg total) by mouth daily. 05/20/21   Pokhrel, Rebekah Chesterfield, MD  atorvastatin (LIPITOR) 20 MG tablet Take 1 tablet (20 mg total) by mouth daily. 05/19/21 05/19/22  Pokhrel, Rebekah Chesterfield, MD  hydrochlorothiazide (MICROZIDE) 12.5 MG capsule Take 1 capsule (12.5 mg total) by mouth daily. 05/20/21   Pokhrel, Rebekah Chesterfield, MD  Multiple Vitamins-Minerals (MENS 50+ MULTI VITAMIN/MIN PO) Take 1 Dose by mouth in the morning and at bedtime.    [provider]  pantoprazole (PROTONIX) 40 MG tablet Take 1 tablet (40 mg total) by mouth daily. 05/20/21   Pokhrel, Rebekah Chesterfield, MD  polyethylene glycol (MIRALAX / GLYCOLAX) 17 g packet Take 17 g by mouth 2 (two) times daily.  05/19/21   Pokhrel, Rebekah Chesterfield, MD  senna-docusate (SENOKOT-S) 8.6-50 MG tablet Take 1 tablet by mouth 2 (two) times daily. 05/19/21   Pokhrel, Rebekah Chesterfield, MD     Critical care time: 81     The patient is critically ill due to encephalopathy, intracranial hemorrhage.  Critical care was necessary to treat or prevent imminent or life-threatening deterioration.  Critical care was time spent personally by me on the following activities: development of treatment plan with patient and/or surrogate as well as nursing, discussions with consultants, evaluation of patient's response to treatment, examination of patient, obtaining history from patient or surrogate, ordering and performing treatments and interventions, ordering and review of laboratory studies, ordering and review of radiographic studies, pulse oximetry, re-evaluation of patient's condition and participation in multidisciplinary rounds.   Critical Care Time devoted to patient care services described in this note is 35 minutes. This time  reflects time of care of this signee Charlott Holler . This critical care time does not reflect separately billable procedures or procedure time, teaching time or supervisory time of PA/NP/Med student/Med Resident etc but could involve care discussion time.       Charlott Holler [facility] 05/23/2021 11:54 AM  Pager: see AMION  If no response to pager , please call critical care on call (see AMION) until 7pm After 7:00 pm call Elink

## 2021-05-23 NOTE — Procedures (Addendum)
Patient Name: Donald [name]  MRN: [id number]  Epilepsy Attending: Charlsie Quest  Referring Physician/Provider: Dr Bing Neighbors Date: 05/23/2021 Duration: 25.36 mins  Patient history:  65 y.o. male currently admitted for ICH on 05/11/21 developed somnolence and aphasia with R gaze deviation that resolved with ativan. EEG to evaluate for seizure  Level of alertness: asleep  AEDs during EEG study: LEV  Technical aspects: This EEG study was done with scalp electrodes positioned according to the 10-20 International system of electrode placement. Electrical activity was acquired at a sampling rate of 500Hz  and reviewed with a high frequency filter of 70Hz  and a low frequency filter of 1Hz . EEG data were recorded continuously and digitally stored.   Description: No posterior dominant rhythm was seen. Sleep was characterized by sleep spindles (12 to 14 Hz), maximal frontocentral region. EEG showed continuous generalized 3 to 6 Hz theta-delta slowing. There is an excessive amount of 15 to 18 Hz, 2-3 uV beta activity distributed symmetrically and diffusely.  Hyperventilation and photic stimulation were not performed.     ABNORMALITY - Continuous slow, generalized - Excessive beta, generalized  IMPRESSION: This study is suggestive of moderate to severe diffuse encephalopathy, nonspecific etiology. The excessive beta activity seen in the background is most likely due to the effect of benzodiazepine and is a benign EEG pattern. No seizures or epileptiform discharges were seen throughout the recording.   Vicky Schleich 

## 2021-05-23 NOTE — Code Documentation (Signed)
Nurse tech was helping the patient eat, who is normally able to feed himself with his right arm, under supervision. The NT noticed that the patient was acting differently. She noted that everytime he took a sip of fluids or food the contents would fall out of his mouth. She notified the MD and subsequently a code stroke was called on this patient. LKW was last night. Patient was admitted for ICH on 05/11/21. PMH include htn, OSA, HLD. NIHSS score of 17. Patient d/c'd from rehab and directly admitted to 4N-ICU. 2 mg of IM lorazapam and IV keppra given.

## 2021-05-23 NOTE — Progress Notes (Signed)
NT reported to writter that patient is unresponsive this morning, this is a change from yesterday. Dr Berline Chough notified and a Code Stroke was initiated.

## 2021-05-23 NOTE — Progress Notes (Signed)
LTM EEG hooked up and running - no initial skin breakdown - push button tested - neuro notified. [facility] monitoring.  

## 2021-05-24 ENCOUNTER — Inpatient Hospital Stay (HOSPITAL_COMMUNITY): Payer: No Typology Code available for payment source

## 2021-05-24 ENCOUNTER — Encounter (HOSPITAL_COMMUNITY): Payer: Self-pay | Admitting: Internal Medicine

## 2021-05-24 DIAGNOSIS — I611 Nontraumatic intracerebral hemorrhage in hemisphere, cortical: Principal | ICD-10-CM

## 2021-05-24 DIAGNOSIS — I619 Nontraumatic intracerebral hemorrhage, unspecified: Secondary | ICD-10-CM | POA: Insufficient documentation

## 2021-05-24 DIAGNOSIS — G936 Cerebral edema: Secondary | ICD-10-CM

## 2021-05-24 DIAGNOSIS — D72829 Elevated white blood cell count, unspecified: Secondary | ICD-10-CM

## 2021-05-24 LAB — BASIC METABOLIC PANEL
Anion gap: 12 (ref 5–15)
BUN: 27 mg/dL — ABNORMAL HIGH (ref 8–23)
CO2: 27 mmol/L (ref 22–32)
Calcium: 10.1 mg/dL (ref 8.9–10.3)
Chloride: 96 mmol/L — ABNORMAL LOW (ref 98–111)
Creatinine, Ser: 1.45 mg/dL — ABNORMAL HIGH (ref 0.61–1.24)
GFR, Estimated: 54 mL/min — ABNORMAL LOW (ref >60)
Glucose, Bld: 135 mg/dL — ABNORMAL HIGH (ref 70–99)
Potassium: 4.4 mmol/L (ref 3.5–5.1)
Sodium: 135 mmol/L (ref 135–145)

## 2021-05-24 LAB — URINALYSIS, COMPLETE (UACMP) WITH MICROSCOPIC
Bilirubin Urine: NEGATIVE
Glucose, UA: 50 mg/dL — AB
Ketones, ur: NEGATIVE mg/dL
Leukocytes,Ua: NEGATIVE
Nitrite: POSITIVE — AB
Protein, ur: 30 mg/dL — AB
Specific Gravity, Urine: 1.023 (ref 1.005–1.030)
pH: 6 (ref 5.0–8.0)

## 2021-05-24 LAB — CBC
HCT: 49.6 % (ref 39.0–52.0)
Hemoglobin: 16.3 g/dL (ref 13.0–17.0)
MCH: 29.6 pg (ref 26.0–34.0)
MCHC: 32.9 g/dL (ref 30.0–36.0)
MCV: 90 fL (ref 80.0–100.0)
Platelets: 309 10*3/uL (ref 150–400)
RBC: 5.51 MIL/uL (ref 4.22–5.81)
RDW: 13.1 % (ref 11.5–15.5)
WBC: 22.9 10*3/uL — ABNORMAL HIGH (ref 4.0–10.5)
nRBC: 0 % (ref 0.0–0.2)

## 2021-05-24 LAB — PHOSPHORUS
Phosphorus: 3.6 mg/dL (ref 2.5–4.6)
Phosphorus: 3.2 mg/dL (ref 2.5–4.6)

## 2021-05-24 LAB — GLUCOSE, CAPILLARY
Glucose-Capillary: 144 mg/dL — ABNORMAL HIGH (ref 70–99)
Glucose-Capillary: 188 mg/dL — ABNORMAL HIGH (ref 70–99)
Glucose-Capillary: 250 mg/dL — ABNORMAL HIGH (ref 70–99)

## 2021-05-24 LAB — SODIUM
Sodium: 136 mmol/L (ref 135–145)
Sodium: 141 mmol/L (ref 135–145)
Sodium: 140 mmol/L (ref 135–145)

## 2021-05-24 LAB — MAGNESIUM
Magnesium: 2.5 mg/dL — ABNORMAL HIGH (ref 1.7–2.4)
Magnesium: 2.5 mg/dL — ABNORMAL HIGH (ref 1.7–2.4)

## 2021-05-24 MED ORDER — SODIUM CHLORIDE 3 % IV SOLN
INTRAVENOUS | Status: DC
  Administered 2021-05-24: 50 mL/h via INTRAVENOUS
  Filled 2021-05-24 (×2): qty 500

## 2021-05-24 MED ORDER — OSMOLITE 1.5 CAL PO LIQD
1000.0000 mL | ORAL | Status: DC
  Administered 2021-05-24 – 2021-06-04 (×9): 1000 mL
  Filled 2021-05-24 (×3): qty 1000

## 2021-05-24 MED ORDER — SODIUM CHLORIDE 3 % IV SOLN
INTRAVENOUS | Status: DC
  Filled 2021-05-24 (×2): qty 500

## 2021-05-24 MED ORDER — ACETAMINOPHEN 160 MG/5ML PO SOLN
650.0000 mg | ORAL | Status: DC | PRN
  Administered 2021-05-24 – 2021-06-02 (×9): 650 mg
  Filled 2021-05-24 (×8): qty 20.3

## 2021-05-24 MED ORDER — CHLORHEXIDINE GLUCONATE CLOTH 2 % EX PADS
6.0000 | MEDICATED_PAD | Freq: Every day | CUTANEOUS | Status: DC
  Administered 2021-05-24 – 2021-06-02 (×8): 6 via TOPICAL

## 2021-05-24 MED ORDER — OSMOLITE 1.2 CAL PO LIQD: 1000.0000 mL | ORAL | Status: DC

## 2021-05-24 MED ORDER — PROSOURCE TF PO LIQD
45.0000 mL | Freq: Every day | ORAL | Status: DC
  Administered 2021-05-24 – 2021-06-04 (×10): 45 mL
  Filled 2021-05-24 (×10): qty 45

## 2021-05-24 NOTE — Progress Notes (Signed)
Patient with decreased responsiveness, unable to cooperate for diagnostic angiogram today.  Dr. Corliss Skains has discussed with patient's wife and Dr. Roda Shutters.  Reconsider for later this week based on mental status.   Loyce Dys, MS RD PA-C 1:26 PM

## 2021-05-24 NOTE — Progress Notes (Signed)
eLink Physician-Brief Progress Note Patient Name: Donald [name] DOB: [birth year] MRN: [id number]   Date of Service  05/24/2021  HPI/Events of Note  Na 140 (minimally changed from prior value of 141).  Goal Na is 145-150. Currently on 3% saline at 50 mL/hr.   eICU Interventions  Discussed with RN. Increase 3% saline to 65 mL/hr. Next Na is due at ~0145.     Intervention Category Intermediate Interventions: Other:  Janae Bridgeman 05/24/2021, 11:01 PM

## 2021-05-24 NOTE — Procedures (Signed)
Cortrak  Person Inserting Tube:  Janyah Singleterry, Verdon Cummins, RD Tube Type:  Cortrak - 43 inches Tube Size:  10 Tube Location:  Right nare Initial Placement:  Stomach Secured by: Bridle Technique Used to Measure Tube Placement:  Marking at nare/corner of mouth Cortrak Secured At:  70 cm Cortrak Tube Team Note:  Consult received to place a Cortrak feeding tube.   X-ray is required, abdominal x-ray has been ordered by the Cortrak team. Please confirm tube placement before using the Cortrak tube.   If the tube becomes dislodged please keep the tube and contact the Cortrak team at [email or web address] ([email or web address]) for replacement.  If after hours and replacement cannot be delayed, place a NG tube and confirm placement with an abdominal x-ray.    Eugene Gavia, MS, RD, LDN RD pager number and weekend/on-call pager number located in [place].

## 2021-05-24 NOTE — Discharge Summary (Signed)
Physician Discharge Summary  Patient ID: Lynard Postlewait MRN: [id number] DOB/AGE: 1956-03-03 65 y.o.  Admit date: 05/20/2021 Discharge date: 05/23/2021  Discharge Diagnoses:  Principal Problem:   ICH (intracerebral hemorrhage) (HCC) Active Problems:   Malnutrition of moderate degree   Discharged Condition: serious  Significant Diagnostic Studies: DG Abd 1 View  Result Date: 05/20/2021 CLINICAL DATA:  Abdominal pain. EXAM: ABDOMEN - 1 VIEW COMPARISON:  None. FINDINGS: Large amount of intestinal gas throughout small and large bowel suggesting ileus. There does not appear to be a large amount of fecal matter. No focal obstruction is evident. No free air. No abnormal calcifications or significant bone findings. IMPRESSION: Large amount of intestinal gas suggesting ileus. Electronically Signed   By: Paulina Fusi M.D.   On: 05/20/2021 19:25   CT HEAD WO CONTRAST  Result Date: 05/23/2021 CLINICAL DATA:  Cerebral hemorrhage, decreased responsiveness EXAM: CT HEAD WITHOUT CONTRAST TECHNIQUE: Contiguous axial images were obtained from the base of the skull through the vertex without intravenous contrast. COMPARISON:  Earlier same day FINDINGS: Brain: Parenchymal hematoma centered within the high right frontal lobe does not measure substantially changed from the prior study. Extensive surrounding edema is again noted with regional mass effect. Leftward midline shift measures similar at 14 mm at the level of the septum pellucidum. Ventricle caliber is unchanged with no new trapping. No significant central herniation. There is no new loss of gray-white differentiation. Intraventricular hemorrhage is identified. Vascular: No new findings. Skull: Unremarkable. Sinuses/Orbits: No acute finding. Other: None. IMPRESSION: No substantial change in right frontal parenchymal hemorrhage, edema, and mass effect including midline shift. No new ventricle trapping or acute infarction. Electronically Signed   By: Guadlupe Spanish M.D.   On: 05/23/2021 12:49     Result Date: 05/23/2021 Charlsie Quest, MD     05/23/2021  4:11 PM Patient Name: Geroge Gilliam MRN: [id number] Epilepsy Attending: Charlsie Quest Referring Physician/Provider: Dr Bing Neighbors Date: 05/23/2021 Duration: 25.36 mins Patient history:  65 y.o. male currently admitted for ICH on 05/11/21 developed somnolence and aphasia with R gaze deviation that resolved with ativan. EEG to evaluate for seizure Level of alertness: asleep AEDs during EEG study: LEV Technical aspects: This EEG study was done with scalp electrodes positioned according to the 10-20 International system of electrode placement. Electrical activity was acquired at a sampling rate of 500Hz  and reviewed with a high frequency filter of 70Hz  and a low frequency filter of 1Hz . EEG data were recorded continuously and digitally stored. Description: No posterior dominant rhythm was seen. Sleep was characterized by sleep spindles (12 to 14 Hz), maximal frontocentral region. EEG showed continuous generalized 3 to 6 Hz theta-delta slowing. There is an excessive amount of 15 to 18 Hz, 2-3 uV beta activity distributed symmetrically and diffusely.  Hyperventilation and photic stimulation were not performed.   ABNORMALITY - Continuous slow, generalized - Excessive beta, generalized IMPRESSION: This study is suggestive of moderate to severe diffuse encephalopathy, nonspecific etiology. The excessive beta activity seen in the background is most likely due to the effect of benzodiazepine and is a benign EEG pattern. No seizures or epileptiform discharges were seen throughout the recording. Priyanka   Labs:  Basic Metabolic Panel: Recent Labs  Lab 05/18/21 0446 05/21/21 0532  NA 134* 134*  K 4.6 4.3  CL 95* 95*  CO2 28 29  GLUCOSE 116* 133*  BUN 22 20  CREATININE 1.23 1.20  CALCIUM 10.1 10.0  MG 2.7*  --   PHOS  --   --  CBC: Recent Labs  Lab 05/18/21 0446 05/21/21 0532  WBC 6.6  8.7  NEUTROABS  --  5.4  HGB 17.0 16.2  HCT 50.1 47.5  MCV 88.8 88.3  PLT 257 292    CBG: Recent Labs  Lab 05/23/21 0834  GLUCAP 157*    Brief HPI:   Wilkes Potvin is a 65 y.o. male with history of declining memory and personality changes in the past couple of years who was admitted on 05/11/21 with acute left hemiparesis, sensory deficits, right gaze preference and left facial droop.  Blood pressure was elevated at admission-124/104 and CT done showing 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  CTA head/neck was negative for LVO but showed narrowing of superior sagittal sinus with question of mass-effect versus intraluminal thrombus.  Follow-up MRI/MRA brain showed no significant change in bleed with surrounding edema as well as chronic hemosiderin right parietal lobe indicating site of prior hemorrhage.    He was started on Cleviprex for BP control and Dr. Roda Shutters question CAA.  MRV brain showed no evidence of intracranial venous thrombosis and mild vascular enhancement felt to be reactive in nature.  Neurology recommended repeat CTA head/MRI brain on outpatient basis after hematoma resolves to rule out underlying source of bleed.  Patient continued to be limited by left facial droop with right gaze preference, left hemiplegia as well as cognitive deficits.  Therapy was ongoing and patient was limited by left hemiplegia with weakness as well as right gaze preference.  CIR was recommended due to functional decline.   Hospital Course: Stanlee Roehrig was admitted to rehab 05/20/2021 for inpatient therapies to consist of PT, ST and OT at least three hours five days a week. Past admission physiatrist, therapy team and rehab RN have worked together to provide customized collaborative inpatient rehab.  Patient was indicating abdominal discomfort at admission therefore KUB was done showing ileus.  However patient had positive bowel sounds without GI symptoms therefore was monitored on  regular diet..  Follow-up labs showed mild stable hyponatremia and blood pressures were relatively controlled.    Therapy evaluation at admission revealed that patient required total assist with mobility and ADL tasks with strong pressure tendencies.  He was found to have expressive deficits with lethargy, low vocal intensity and dysphagia with oral holding requiring mod cues to expectorate.  Supervision was added with meals with staff to assist as needed.  On a.m. on 06/12 patient was noted to be have decrease in LOC and stat CT of head was done revealing midline shift.  He was transferred to acute floor for management and monitoring.  Medications at discharge:  Norvasc 10 mg po daily Ensure supplement tid HCTZ 12.5 mg po daily Heparin 5000 units SQ TID. 5 Miralax po bid Protonix 40 mg daily MVIT one tab po daily.  Senna S one tab bid.    Discharge disposition: 02-Transferred to [facility]            Signed: Jacquelynn Cree 05/24/2021, 6:07 PM

## 2021-05-24 NOTE — Procedures (Addendum)
Patient Name: Donald [name]  MRN: [id number]  Epilepsy Attending: Charlsie Quest  Referring Physician/Provider: Dr Bing Neighbors Duration: 05/23/2021 1655 to 05/24/2021 1655   Patient history:  65 y.o. male currently admitted for ICH on 05/11/21 developed somnolence and aphasia with R gaze deviation that resolved with ativan. EEG to evaluate for seizure   Level of alertness: awake, asleep   AEDs during EEG study: LEV   Technical aspects: This EEG study was done with scalp electrodes positioned according to the 10-20 International system of electrode placement. Electrical activity was acquired at a sampling rate of 500Hz  and reviewed with a high frequency filter of 70Hz  and a low frequency filter of 1Hz . EEG data were recorded continuously and digitally stored.   Description: No posterior dominant rhythm was seen. Sleep was characterized by sleep spindles (12 to 14 Hz), asymmetry ( R<L) maximal frontocentral region. EEG showed continuous generalized and lateralized right hemisphere 3 to 6 Hz theta-delta slowing. Hyperventilation and photic stimulation were not performed.      ABNORMALITY - Continuous slow, generalized and lateralized right hemisphere - Spindle asymmetry, right<left   IMPRESSION: This study is suggestive of cortical dysfunction in right hemisphere likely secondary to underlying structural abnormality.  Additionally there is moderate to severe diffuse encephalopathy, nonspecific etiology. No seizures or definite epileptiform discharges were seen throughout the recording.     Saanvika Vazques 

## 2021-05-24 NOTE — Progress Notes (Signed)
Initial Nutrition Assessment  DOCUMENTATION CODES:   Non-severe (moderate) malnutrition in context of chronic illness  INTERVENTION:   Tube Feeding via Cortrak:  Osmolite 1.5 at 50 ml/hr Pro-Source TF 45 mL BID Provides 97 g of protein, 1880 kcals and 912 mL of free water   NUTRITION DIAGNOSIS:   Moderate Malnutrition related to chronic illness as evidenced by mild fat depletion, mild muscle depletion, moderate muscle depletion.  GOAL:   Patient will meet greater than or equal to 90% of their needs  MONITOR:   TF tolerance, Diet advancement, Labs, Other (Comment)  REASON FOR ASSESSMENT:   Consult Enteral/tube feeding initiation and management  ASSESSMENT:   65 yo male transferred from CIR with worsening mental status and seizures. Initially presented to hospital on 5/31 with ICH. PMH includes HTN, cognitive delcine-being worked up for early onset dementia as outpatient.  6/12 Admitted from inpatient rehab with worsening mental status, seizures 6/13 Cortrak   NPO, pt is lethargic. Febrile. Continuous EEG Noted plan for cerebral angiogram  Cleviprex: OFF Currently on 3%NS  Labs: reviewed; Na 136, Creatinine 1.45 Meds: reviewed    NUTRITION - FOCUSED PHYSICAL EXAM:  Flowsheet Row Most Recent Value  Orbital Region Mild depletion  Upper Arm Region Mild depletion  Thoracic and Lumbar Region Mild depletion  Buccal Region Moderate depletion  Temple Region Moderate depletion  Clavicle Bone Region Moderate depletion  Clavicle and Acromion Bone Region Moderate depletion  Scapular Bone Region Moderate depletion  Dorsal Hand Mild depletion  Patellar Region Moderate depletion  Anterior Thigh Region Moderate depletion  Posterior Calf Region Moderate depletion  Edema (RD Assessment) None       Diet Order:   Diet Order             Diet NPO time specified  Diet effective now                   EDUCATION NEEDS:   Not appropriate for education at this  time  Skin:  Skin Assessment: Reviewed RN Assessment  Last BM:  6/12  Height:   Ht Readings from Last 1 Encounters:  05/20/21 5\' 5"  (1.651 m)    Weight:   Wt Readings from Last 1 Encounters:  05/24/21 66.5 kg     BMI:  Body mass index is 24.4 kg/m.  Estimated Nutritional Needs:   Kcal:  1850-2050 kcals  Protein:  90-110 g  Fluid:  >/= 1.9 L   05/26/21 MS, RDN, LDN, CNSC Registered Dietitian III Clinical Nutrition RD Pager and On-Call Pager Number Located in [place]

## 2021-05-24 NOTE — Evaluation (Signed)
Clinical/Bedside Swallow Evaluation Patient Details  Name: Donald [name] MRN: [id number] Date of Birth: [birth year]  Today's Date: 05/24/2021 Time: SLP Start Time (ACUTE ONLY): 1021 SLP Stop Time (ACUTE ONLY): 1041 SLP Time Calculation (min) (ACUTE ONLY): 20 min  Past Medical History:  Past Medical History:  Diagnosis Date   Arthritis    Past Surgical History:  Past Surgical History:  Procedure Laterality Date   APPENDECTOMY     HPI:  Donald [name] is a 65 y.o. male with a medical history significant for dementia with behavioral disturbance, OSA, arthritis  admitted 5/31 with acute parenchymal hemorrhage in the posterior right frontal lobe with small amount of adjacent subarachnoid hemorrhage. Seen by ST and progressed from puree/thin to Dys 2. Transferred to CIR and experienced increased stroke symptoms 6/12 morning. CT revealed progressed right frontal hematoma and vasogenic edema with midline shift now measuring 13 mm and transferred to acute. Per chart and wife history he had some short term memory deficits and evaluated and diagnosed with mild cognitive deficits at [facility].   Assessment / Plan / Recommendation Clinical Impression  This therapist familiar with pt from prior acute admission. He is very lethargic presently with wife at bedside. Oral hygiene provided while stretching neck to his right side and attempting to remove therapists hand. SLP was unable to elicit vocalizations during assessment. Minimal attempts to close mouth around spoon when placed in oral cavity. Did not provide boluses at this time. Cortrak team to place NG later today and ST will continue efforts at po tomorrow. SLP Visit Diagnosis: Dysphagia, unspecified (R13.10)    Aspiration Risk  Moderate aspiration risk    Diet Recommendation NPO   Medication Administration: Via alternative means    Other  Recommendations Oral Care Recommendations: Oral care QID   Follow up Recommendations Inpatient Rehab       Frequency and Duration min 2x/week  2 weeks       Prognosis Prognosis for Safe Diet Advancement: Good      Swallow Study   General Date of Onset: 05/11/21 HPI: Donald [name] is a 65 y.o. male with a medical history significant for dementia with behavioral disturbance, OSA, arthritis  admitted 5/31 with acute parenchymal hemorrhage in the posterior right frontal lobe with small amount of adjacent subarachnoid hemorrhage. Seen by ST and progressed from puree/thin to Dys 2. Transferred to CIR and experienced increased stroke symptoms 6/12 morning. CT revealed progressed right frontal hematoma and vasogenic edema with midline shift now measuring 13 mm and transferred to acute. Per chart and wife history he had some short term memory deficits and evaluated and diagnosed with mild cognitive deficits at [facility]. Type of Study: Bedside Swallow Evaluation Previous Swallow Assessment: see HPI Diet Prior to this Study: NPO Temperature Spikes Noted: No Respiratory Status: Nasal cannula History of Recent Intubation: No Behavior/Cognition: Lethargic/Drowsy;Requires cueing;Cooperative Oral Cavity Assessment: Dried secretions Oral Care Completed by SLP: Yes Oral Cavity - Dentition: Adequate natural dentition Vision: Impaired for self-feeding Self-Feeding Abilities: Total assist Patient Positioning: Upright in bed Baseline Vocal Quality:  (no vocalizations) Volitional Cough: Cognitively unable to elicit Volitional Swallow: Unable to elicit    Oral/Motor/Sensory Function Overall Oral Motor/Sensory Function: Moderate impairment Facial ROM:  (did not follow commands) Facial Symmetry: Abnormal symmetry left;Suspected CN VII (facial) dysfunction   Ice Chips Ice chips: Not tested   Thin Liquid Thin Liquid: Not tested    Nectar Thick     Honey Thick Honey Thick Liquid: Not tested   Puree Puree: Not tested  Solid     Solid: Not tested      [name] 05/24/2021,11:08 AM  [name] [name].Ed Nurse, children's [phone] Office [phone]

## 2021-05-24 NOTE — Progress Notes (Signed)
STROKE TEAM PROGRESS NOTE   SUBJECTIVE (INTERVAL HISTORY) His wife and RN are at the bedside.  Patient lying in bed, drowsy sleepy and not cooperative with neuro exam. Per wife, pt Saturday ate breakfast and lunch OK, did not have appetite for dinner. Had bowel movement that day. Sunday (yesterday) morning, pt was found to be more lethargic, drowsy sleepy and CT head showed more prominent cerebral edema with ICH and midline shift. Also concerning for seizure and put on keppra and EEG LTM. He was sent back from CIR to ICU for management. Now on 3% saline. Na 135 this am.    OBJECTIVE Temp:  [97.5 F (36.4 C)-101.4 F (38.6 C)] 98.8 F (37.1 C) (06/13 0800) Pulse Rate:  [101-119] 104 (06/13 0800) Cardiac Rhythm: Normal sinus rhythm (06/13 0800) Resp:  [14-31] 27 (06/13 0800) BP: (108-138)/(63-90) 132/84 (06/13 0800) SpO2:  [92 %-100 %] 99 % (06/13 0800) Weight:  [66.5 kg] 66.5 kg (06/13 0500)  Recent Labs  Lab 05/23/21 0834  GLUCAP 157*   Recent Labs  Lab 05/18/21 0446 05/21/21 0532 05/24/21 0134  NA 134* 134* 135  K 4.6 4.3 4.4  CL 95* 95* 96*  CO2 28 29 27   GLUCOSE 116* 133* 135*  BUN 22 20 27*  CREATININE 1.23 1.20 1.45*  CALCIUM 10.1 10.0 10.1  MG 2.7*  --  2.5*  PHOS  --   --  3.6   Recent Labs  Lab 05/21/21 0532  AST 27  ALT 25  ALKPHOS 100  BILITOT 0.9  PROT 7.7  ALBUMIN 4.0   Recent Labs  Lab 05/18/21 0446 05/21/21 0532 05/24/21 0134  WBC 6.6 8.7 22.9*  NEUTROABS  --  5.4  --   HGB 17.0 16.2 16.3  HCT 50.1 47.5 49.6  MCV 88.8 88.3 90.0  PLT 257 292 309   No results for input(s): CKTOTAL, CKMB, CKMBINDEX, TROPONINI in the last 168 hours. No results for input(s): LABPROT, INR in the last 72 hours. No results for input(s): COLORURINE, LABSPEC, PHURINE, GLUCOSEU, HGBUR, BILIRUBINUR, KETONESUR, PROTEINUR, UROBILINOGEN, NITRITE, LEUKOCYTESUR in the last 72 hours.  Invalid input(s): APPERANCEUR     Component Value Date/Time   CHOL 232 (H) 05/11/2021  1118   CHOL 204 (H) 09/11/2020 1449   TRIG 73 05/11/2021 1118   HDL 77 05/11/2021 1118   HDL 63 09/11/2020 1449   CHOLHDL 3.0 05/11/2021 1118   VLDL 15 05/11/2021 1118   LDLCALC 140 (H) 05/11/2021 1118   LDLCALC 132 (H) 09/11/2020 1449   LDLCALC 107 (H) 04/29/2020 0826   Lab Results  Component Value Date   HGBA1C 5.8 (H) 05/11/2021      Component Value Date/Time   LABOPIA NONE DETECTED 05/11/2021 1109   COCAINSCRNUR NONE DETECTED 05/11/2021 1109   LABBENZ NONE DETECTED 05/11/2021 1109   AMPHETMU NONE DETECTED 05/11/2021 1109   THCU NONE DETECTED 05/11/2021 1109   LABBARB NONE DETECTED 05/11/2021 1109    No results for input(s): ETH in the last 168 hours.   I have personally reviewed the radiological images below and agree with the radiology interpretations.  DG Abd 1 View  Result Date: 05/20/2021 CLINICAL DATA:  Abdominal pain. EXAM: ABDOMEN - 1 VIEW COMPARISON:  None. FINDINGS: Large amount of intestinal gas throughout small and large bowel suggesting ileus. There does not appear to be a large amount of fecal matter. No focal obstruction is evident. No free air. No abnormal calcifications or significant bone findings. IMPRESSION: Large amount of intestinal gas suggesting  ileus. Electronically Signed   By: Paulina Fusi M.D.   On: 05/20/2021 19:25   CT HEAD WO CONTRAST  Result Date: 05/23/2021 CLINICAL DATA:  Follow-up intracerebral hemorrhage EXAM: CT HEAD WITHOUT CONTRAST TECHNIQUE: Contiguous axial images were obtained from the base of the skull through the vertex without intravenous contrast. COMPARISON:  CT 05/23/2021 FINDINGS: Brain: No significant interval change in the overall size of the intraparenchymal hemorrhagic component seen in the right frontal with some extensive surrounding hypo attenuating edematous changes. Locoregional mass effect with sulcal effacement and effacement of the right lateral ventricle as well as stable subfalcine herniation with a right to left  midline shift of approximately 14 mm. Slight medialization of the right uncus without clear transtentorial herniation is also unchanged from prior. Basal cisterns remain patent. No new sites of hemorrhage. No new loss of gray-white differentiation. Vascular: No hyperdense vessel or unexpected calcification. Skull: Normal. Negative for fracture or focal lesion. EEG leads in place. Sinuses/Orbits: Mild mural thickening in the maxillary sinuses. Paranasal sinuses and mastoid air cells are otherwise predominantly clear. Included orbital structures are unremarkable. Other: None. IMPRESSION: 1. Stable appearance of the right frontal lobe intraparenchymal hemorrhage with surrounding cerebral edema, local mass effect as well as subfalcine midline shift of approximately 14 mm in slight medial ization of the right uncus, not significantly changed from comparison imaging. 2. No new sites of hemorrhage or new gray-white differentiation loss. Electronically Signed   By: Kreg Shropshire M.D.   On: 05/23/2021 23:58   CT HEAD WO CONTRAST  Result Date: 05/23/2021 CLINICAL DATA:  Follow-up right frontal parenchymal hemorrhage seen earlier today. EXAM: CT HEAD WITHOUT CONTRAST TECHNIQUE: Contiguous axial images were obtained from the base of the skull through the vertex without intravenous contrast. COMPARISON:  Earlier today. FINDINGS: Brain: No significant change in the previously described high right frontal lobe parenchymal hematoma with extensive surrounding edema and midline shift to the left. The midline shift currently measures 10 mm on image number 16/3. No interval ventricular enlargement. No new hemorrhage. Vascular: No hyperdense vessel or unexpected calcification. Skull: Normal. Negative for fracture or focal lesion. Sinuses/Orbits: Stable small left frontal sinus osteoma. Mild moderate left maxillary sinus mucosal thickening without significant change. Unremarkable orbits. Other: Bilateral concha bullosa. IMPRESSION: 1.  Stable large high right frontal parenchymal hematoma with extensive associated edema and midline shift to the left. 2. Stable left maxillary chronic sinusitis. Electronically Signed   By: Beckie Salts M.D.   On: 05/23/2021 18:00   CT HEAD WO CONTRAST  Result Date: 05/23/2021 CLINICAL DATA:  Cerebral hemorrhage, decreased responsiveness EXAM: CT HEAD WITHOUT CONTRAST TECHNIQUE: Contiguous axial images were obtained from the base of the skull through the vertex without intravenous contrast. COMPARISON:  Earlier same day FINDINGS: Brain: Parenchymal hematoma centered within the high right frontal lobe does not measure substantially changed from the prior study. Extensive surrounding edema is again noted with regional mass effect. Leftward midline shift measures similar at 14 mm at the level of the septum pellucidum. Ventricle caliber is unchanged with no new trapping. No significant central herniation. There is no new loss of gray-white differentiation. Intraventricular hemorrhage is identified. Vascular: No new findings. Skull: Unremarkable. Sinuses/Orbits: No acute finding. Other: None. IMPRESSION: No substantial change in right frontal parenchymal hemorrhage, edema, and mass effect including midline shift. No new ventricle trapping or acute infarction. Electronically Signed   By: Guadlupe Spanish M.D.   On: 05/23/2021 12:49   CT HEAD WO CONTRAST  Result Date: 05/11/2021  CLINICAL DATA:  Stroke.  Intracranial hemorrhage follow-up. EXAM: CT HEAD WITHOUT CONTRAST TECHNIQUE: Contiguous axial images were obtained from the base of the skull through the vertex without intravenous contrast. COMPARISON:  Same day head CT. FINDINGS: Brain: Mild interval increase in the size of the right frontal intraparenchymal hemorrhage, measuring 4.5 x 2.6 x 3.5 cm (previously 4.2 x 2.3 x 3.1 cm). Similar adjacent small volume subarachnoid hemorrhage. A small amount of subdural hemorrhage in this region is also not excluded.  Surrounding edema is similar. Similar local mass effect without midline shift. Basal cisterns are patent. No evidence of acute large vascular territory infarct elsewhere. Similar mild white matter hypodensities, likely chronic microvascular ischemic disease. No hydrocephalus. Vascular: No hyperdense vessel.  Calcific atherosclerosis. Skull: No acute fracture. Sinuses/Orbits: Small left frontal sinus osteoma. Mild right and moderate left maxillary sinus mucosal thickening. Unremarkable orbits. Other: No mastoid effusions. IMPRESSION: Mild interval increase in the size of the right frontal intraparenchymal hemorrhage, measuring 4.5 x 2.6 x 3.5 cm (previously 4.2 x 2.3 x 3.1 cm). Similar adjacent small volume hemorrhage. Electronically Signed   By: Feliberto Harts MD   On: 05/11/2021 17:05   MR BRAIN WO CONTRAST  Result Date: 05/12/2021 CLINICAL DATA:  65 year old male code stroke presentation with intra-axial right hemisphere hemorrhage. Hypertensive (194/104) on presentation. EXAM: MRI HEAD WITHOUT CONTRAST TECHNIQUE: Multiplanar, multiecho pulse sequences of the brain and surrounding structures were obtained without intravenous contrast. COMPARISON:  CT head CTA head and neck 05/11/2021 FINDINGS: Brain: Coronal T2 weighted imaging could not be obtained. And some of the exam is intermittently degraded by motion artifact despite repeated imaging attempts. T1 isointense intra-axial hemorrhage in the posterior right frontal lobe demonstrates a layering hematocrit level (series 13, image 19) and blood encompasses 43 by 38 x 33 mm (AP by transverse by CC) for an estimated volume of 26 mL. Regional edema. Mild regional mass effect. Trace subarachnoid hemorrhage also suspected on axial FLAIR imaging. Additionally, on SWI there is also chronic hemosiderin in the right parietal lobe on series 19 image 34. But no other definite chronic blood products. Susceptibility related abnormal diffusion in the posterior right  frontal lobe with no convincing larger area of restricted diffusion. No restricted diffusion elsewhere. No IVH or ventriculomegaly. Normal basilar cisterns. Negative pituitary and cervicomedullary junction. Wallace Cullens and white matter signal outside of the affected right hemisphere is largely normal for age with mild nonspecific white matter changes. Vascular: Major intracranial vascular flow voids are preserved. Skull and upper cervical spine: Visualized bone marrow signal is within normal limits. Grossly negative cervical spine. Sinuses/Orbits: Negative orbits. Mild to moderate maxillary sinus mucosal thickening. Other: Mastoids are clear. Grossly normal visible internal auditory structures. IMPRESSION: 1. Posterior right frontal lobe intra-axial hemorrhage with layering hematocrit level has not significantly changed (estimated blood volume of 26 mL). Surrounding edema and mild regional mass effect. Trace subarachnoid hemorrhage. 2. No larger underlying infarct is evident. But chronic hemosiderin in the right parietal lobe indicates a previous intra-axial hemorrhage in the region. 3. No other acute intracranial abnormality. Electronically Signed   By: Odessa Fleming M.D.   On: 05/12/2021 07:26   EEG adult  Result Date: 05/23/2021 Charlsie Quest, MD     05/23/2021  4:11 PM Patient Name: Donald [name] MRN: [id number] Epilepsy Attending: Charlsie Quest Referring Physician/Provider: Dr Bing Neighbors Date: 05/23/2021 Duration: 25.36 mins Patient history:  65 y.o. male currently admitted for ICH on 05/11/21 developed somnolence and aphasia with R gaze deviation that  resolved with ativan. EEG to evaluate for seizure Level of alertness: asleep AEDs during EEG study: LEV Technical aspects: This EEG study was done with scalp electrodes positioned according to the 10-20 International system of electrode placement. Electrical activity was acquired at a sampling rate of  and reviewed with a high frequency filter of  and a  low frequency filter of . EEG data were recorded continuously and digitally stored. Description: No posterior dominant rhythm was seen. Sleep was characterized by sleep spindles (12 to 14 Hz), maximal frontocentral region. EEG showed continuous generalized 3 to 6 Hz theta-delta slowing. There is an excessive amount of 15 to 18 Hz, 2-3 uV beta activity distributed symmetrically and diffusely.  Hyperventilation and photic stimulation were not performed.   ABNORMALITY - Continuous slow, generalized - Excessive beta, generalized IMPRESSION: This study is suggestive of moderate to severe diffuse encephalopathy, nonspecific etiology. The excessive beta activity seen in the background is most likely due to the effect of benzodiazepine and is a benign EEG pattern. No seizures or epileptiform discharges were seen throughout the recording. Charlsie Quest   MR MRV HEAD W WO CONTRAST  Result Date: 05/12/2021 CLINICAL DATA:  Intracranial hemorrhage. EXAM: MR VENOGRAM HEAD WITHOUT AND WITH CONTRAST TECHNIQUE: Angiographic images of the intracranial venous structures were acquired using MRV technique without and with intravenous contrast. CONTRAST:  7mL GADAVIST GADOBUTROL 1 MMOL/ML IV SOLN COMPARISON:  Brain MRI performed earlier today 05/12/2021. Noncontrast head CT examinations 05/11/2021. CT angiogram head/neck 05/11/2021. FINDINGS: The superior sagittal sinus, internal cerebral veins, vein of Galen, straight sinus, transverse sinuses, sigmoid sinuses and visualized jugular veins are patent. No appreciable intracranial venous thrombosis. Hypoplastic right transverse and sigmoid dural venous sinuses. An acute parenchymal hemorrhage within the posterior right frontal lobe is grossly unchanged in size as compared to the brain MRI performed earlier today. There is mild curvilinear vascular enhancement overlying the site of hemorrhage, which may be reactive. Elsewhere, no abnormal intracranial enhancement is identified.  IMPRESSION: No evidence of intracranial venous thrombosis. Non dominant right transverse and sigmoid dural venous sinuses. Electronically Signed   By: Jackey Loge DO   On: 05/12/2021 11:27   ECHOCARDIOGRAM COMPLETE  Result Date: 05/11/2021    ECHOCARDIOGRAM REPORT   Patient Name:   Donald [name] Date of Exam: 05/11/2021 Medical Rec #:  [id number]        Height:       68.0 in Accession #:    [id number]       Weight:       155.2 lb Date of Birth:  07/11/1956         BSA:          1.835 m Patient Age:    65 years         BP:           115/77 mmHg Patient Gender: M                HR:           84 bpm. Exam Location:  Inpatient Procedure: 2D Echo, Cardiac Doppler and Color Doppler Indications:    Stroke I63.9  History:        Patient has no prior history of Echocardiogram examinations.  Sonographer:    Roosvelt Maser RDCS Referring Phys: [id number] [name] IMPRESSIONS  1. Left ventricular ejection fraction, by estimation, is 65 to 70%. The left ventricle has normal function. The left ventricle has no regional wall motion abnormalities. Left ventricular  diastolic parameters were normal.  2. Right ventricular systolic function is normal. The right ventricular size is normal. Tricuspid regurgitation signal is inadequate for assessing PA pressure.  3. The mitral valve is normal in structure. No evidence of mitral valve regurgitation.  4. The aortic valve was not well visualized. Aortic valve regurgitation is not visualized. No aortic stenosis is present.  5. The inferior vena cava is normal in size with greater than 50% respiratory variability, suggesting right atrial pressure of 3 mmHg. FINDINGS  Left Ventricle: Left ventricular ejection fraction, by estimation, is 65 to 70%. The left ventricle has normal function. The left ventricle has no regional wall motion abnormalities. The left ventricular internal cavity size was normal in size. There is  no left ventricular hypertrophy. Left ventricular diastolic parameters  were normal. Right Ventricle: The right ventricular size is normal. No increase in right ventricular wall thickness. Right ventricular systolic function is normal. Tricuspid regurgitation signal is inadequate for assessing PA pressure. Left Atrium: Left atrial size was normal in size. Right Atrium: Right atrial size was normal in size. Pericardium: There is no evidence of pericardial effusion. Mitral Valve: The mitral valve is normal in structure. No evidence of mitral valve regurgitation. Tricuspid Valve: The tricuspid valve is normal in structure. Tricuspid valve regurgitation is not demonstrated. Aortic Valve: The aortic valve was not well visualized. Aortic valve regurgitation is not visualized. No aortic stenosis is present. Aortic valve mean gradient measures 3.0 mmHg. Aortic valve peak gradient measures 6.7 mmHg. Aortic valve area, by VTI measures 2.77 cm. Pulmonic Valve: The pulmonic valve was not well visualized. Pulmonic valve regurgitation is not visualized. Aorta: The aortic root and ascending aorta are structurally normal, with no evidence of dilitation. Venous: The inferior vena cava is normal in size with greater than 50% respiratory variability, suggesting right atrial pressure of 3 mmHg. IAS/Shunts: The interatrial septum was not well visualized.  LEFT VENTRICLE PLAX 2D LVIDd:         4.90 cm  Diastology LVIDs:         2.80 cm  LV e' medial:    8.38 cm/s LV PW:         0.80 cm  LV E/e' medial:  9.0 LV IVS:        0.90 cm  LV e' lateral:   9.14 cm/s LVOT diam:     1.90 cm  LV E/e' lateral: 8.2 LV SV:         69 LV SV Index:   37 LVOT Area:     2.84 cm  RIGHT VENTRICLE RV Basal diam:  2.90 cm LEFT ATRIUM           Index       RIGHT ATRIUM           Index LA diam:      3.10 cm 1.69 cm/m  RA Area:     11.20 cm LA Vol (A2C): 26.8 ml 14.61 ml/m RA Volume:   21.90 ml  11.94 ml/m LA Vol (A4C): 39.2 ml 21.36 ml/m  AORTIC VALVE AV Area (Vmax):    2.64 cm AV Area (Vmean):   2.59 cm AV Area (VTI):      2.77 cm AV Vmax:           129.00 cm/s AV Vmean:          86.700 cm/s AV VTI:            0.248 m AV Peak Grad:  6.7 mmHg AV Mean Grad:      3.0 mmHg LVOT Vmax:         120.00 cm/s LVOT Vmean:        79.200 cm/s LVOT VTI:          0.242 m LVOT/AV VTI ratio: 0.98  AORTA Ao Root diam: 2.50 cm Ao Asc diam:  3.30 cm MITRAL VALVE MV Area (PHT): 3.60 cm     SHUNTS MV Decel Time: 211 msec     Systemic VTI:  0.24 m MV E velocity: 75.30 cm/s   Systemic Diam: 1.90 cm MV A velocity: 107.00 cm/s MV E/A ratio:  0.70 Epifanio Lesches MD Electronically signed by Epifanio Lesches MD Signature Date/Time: 05/11/2021/5:28:15 PM    Final    CT HEAD CODE STROKE WO CONTRAST  Result Date: 05/23/2021 CLINICAL DATA:  Code stroke.  Acute stroke. EXAM: CT HEAD WITHOUT CONTRAST TECHNIQUE: Contiguous axial images were obtained from the base of the skull through the vertex without intravenous contrast. COMPARISON:  Head CT from 05/11/2021 FINDINGS: Brain: 5.5 x 2.8 cm hematoma in the subcortical high right frontal region with extensive vasogenic edema in the adjacent brain that is progressed. There is worsening local mass effect with midline shift now measuring 13 mm. No entrapment. No cytotoxic edema seen. Vascular: No hyperdense vessel or unexpected calcification. Skull: Normal. Negative for fracture or focal lesion. Sinuses/Orbits: Negative Other: Critical Value/emergent results were called by telephone at the time of interpretation on 05/23/2021 at 9:10 am to provider [facility] [name] , who verbally acknowledged these results. ASPECTS [facility] Stroke Program Early CT Score) Not scored in this setting IMPRESSION: Progressed right frontal hematoma and vasogenic edema with midline shift now measuring 13 mm. Electronically Signed   By: Marnee Spring M.D.   On: 05/23/2021 09:12   CT HEAD CODE STROKE WO CONTRAST  Result Date: 05/11/2021 CLINICAL DATA:  Code stroke. Left-sided weakness, nausea, and vomiting. EXAM: CT HEAD WITHOUT  CONTRAST TECHNIQUE: Contiguous axial images were obtained from the base of the skull through the vertex without intravenous contrast. COMPARISON:  None. FINDINGS: Brain: An acute parenchymal hemorrhage in the high posterior right frontal lobe measures 4.2 x 2.3 x 3.1 cm (approximate volume of 15 mL). There is mild surrounding edema without midline shift, and there is a small amount of adjacent subarachnoid hemorrhage. A very small amount of coexistent subdural hemorrhage is also not excluded in this region. No acute infarct is identified elsewhere. The ventricles are normal in size. Hypodensities in the cerebral white matter bilaterally are nonspecific but compatible with mild chronic small vessel ischemic disease. Vascular: No hyperdense vessel. Skull: No fracture or suspicious osseous lesion. Sinuses/Orbits: Small osteoma in the left frontal sinus. Mild right and moderate left maxillary sinus mucosal thickening. Clear mastoid air cells. Unremarkable orbits. Other: None. ASPECTS [facility] Stroke Program Early CT Score) Not scored in the presence of acute hemorrhage. IMPRESSION: Acute parenchymal hemorrhage in the posterior right frontal lobe with small amount of adjacent subarachnoid hemorrhage. These results were communicated to Dr. Iver Nestle at 9:08 am on 05/11/2021 by text page via the [facility] messaging system. Electronically Signed   By: Sebastian Ache M.D.   On: 05/11/2021 09:12   CT ANGIO HEAD NECK W WO CM (CODE STROKE)  Result Date: 05/23/2021 CLINICAL DATA:  Enlarging hematoma EXAM: CT ANGIOGRAPHY HEAD AND NECK TECHNIQUE: Multidetector CT imaging of the head and neck was performed using the standard protocol during bolus administration of intravenous contrast. Multiplanar CT image reconstructions and  MIPs were obtained to evaluate the vascular anatomy. Carotid stenosis measurements (when applicable) are obtained utilizing NASCET criteria, using the distal internal carotid diameter as the denominator. CONTRAST:   50mL OMNIPAQUE IOHEXOL 350 MG/ML SOLN COMPARISON:  CTA 05/11/2021 FINDINGS: CTA NECK FINDINGS Aortic arch: Normal Right carotid system: Widely patent vessels with smooth contour. No atheromatous changes Left carotid system: Widely patent vessels with smooth contour. No noted atheromatous changes Vertebral arteries: No proximal subclavian stenosis. The vertebral arteries are widely patent. No suspected vertebral beading when allowing for streak artifact. Skeleton: Multilevel degenerative disc narrowing and ridging canal and foraminal encroachment the lower cervical levels. Other neck: No evidence of inflammation or mass. Upper chest: Negative Review of the MIP images confirms the above findings CTA HEAD FINDINGS Anterior circulation: Major vessels are smooth and widely patent. No branch occlusion, generalized beading, or aneurysm. There is a cortical branch at the right frontal parietal convexity which is not continuous beyond the level of the upper and lateral hematoma. No visible nidus or early enhancing veins. Posterior circulation: Vertebral and basilar arteries are smooth and widely patent. No branch occlusion, beading, or aneurysm. Venous sinuses: MRV was obtained previously. The veins are largely nonenhancing on this study. Anatomic variants: None significant Review of the MIP images confirms the above findings IMPRESSION: Seemingly truncated arterial cortical branch at the upper and lateral hematoma, without discrete vascular malformation or spot sign. Recommend follow-up after resolution of mass effect to evaluate for small AVM. Electronically Signed   By: Marnee Spring M.D.   On: 05/23/2021 09:54   CT ANGIO HEAD NECK W WO CM (CODE STROKE)  Result Date: 05/11/2021 CLINICAL DATA:  Stroke follow-up.  Left-sided weakness. EXAM: CT ANGIOGRAPHY HEAD AND NECK TECHNIQUE: Multidetector CT imaging of the head and neck was performed using the standard protocol during bolus administration of intravenous contrast.  Multiplanar CT image reconstructions and MIPs were obtained to evaluate the vascular anatomy. Carotid stenosis measurements (when applicable) are obtained utilizing NASCET criteria, using the distal internal carotid diameter as the denominator. CONTRAST:  50mL OMNIPAQUE IOHEXOL 350 MG/ML SOLN COMPARISON:  Same day CT head. FINDINGS: CTA NECK FINDINGS Aortic arch: Great vessel origins are patent. Right carotid system: No evidence of dissection, stenosis (50% or greater) or occlusion. Mild apparent irregularity of the ICA at the skull base in a region of streak artifact. Left carotid system: No evidence of dissection, stenosis (50% or greater) or occlusion. Mild apparent irregularity of the ICA at the skull base in a region of streak artifact. Vertebral arteries: Codominant. No evidence of dissection, stenosis (50% or greater) or occlusion. Skeleton: Moderate degenerative disc disease at at C5-C6 and C6-C7 with disc height loss, endplate sclerosis and posterior disc osteophyte complexes. Other neck: No acute abnormality Upper chest: Visualized lung apices are clear. Review of the MIP images confirms the above findings CTA HEAD FINDINGS Anterior circulation: No large vessel occlusion or proximal hemodynamically significant stenosis. Evaluation of the distal vasculature is limited due to venous contamination. No evidence of and arteriovenous malformation or aneurysm in the region of intraparenchymal hemorrhage, although acute blood products limit evaluation. Posterior circulation: No large vessel occlusion or proximal hemodynamically significant stenosis. Venous sinuses: The superior sagittal sinus is narrowed in the region of hemorrhage, most likely from mass effect. Small right transverse and sigmoid sinuses. Review of the MIP images confirms the above findings IMPRESSION: CTA Head: 1. No large vessel occlusion or hemodynamically significant proximal arterial stenosis. 2. The superior sagittal sinus is narrowed in the  region of hemorrhage with poorly visualized draining cortical veins in this region. While indeterminate, this may be secondary to mass effect given no definite/clear intraluminal thrombus. Given these findings and the location of hemorrhage; however, recommend low threshold for follow-up CTV or MRI with contrast to ensure stability and exclude worsening thrombosis. 3. No evidence of and arteriovenous malformation or aneurysm in the region of intraparenchymal hemorrhage, although acute blood products limit evaluation. Follow-up imaging after resolution of hemorrhage could provide further assessment if clinically indicated. CTA Neck: 1. No significant (greater than 50%) stenosis. 2. Mild apparent irregularity of bilateral ICAs at the skull base is favored artifactual given streak artifact from dental amalgam in this region. Fibromuscular dysplasia is a differential consideration. Findings and recommendations discussed with Dr. Iver Nestle via telephone at 9:59 a.m. Electronically Signed   By: Feliberto Harts MD   On: 05/11/2021 10:09     PHYSICAL EXAM  Temp:  [97.5 F (36.4 C)-101.4 F (38.6 C)] 98.8 F (37.1 C) (06/13 0800) Pulse Rate:  [101-119] 104 (06/13 0800) Resp:  [14-31] 27 (06/13 0800) BP: (108-138)/(63-90) 132/84 (06/13 0800) SpO2:  [92 %-100 %] 99 % (06/13 0800) Weight:  [66.5 kg] 66.5 kg (06/13 0500)  General - Well nourished, well developed, drowsy sleepy not cooperative on exam.  Ophthalmologic - fundi not visualized due to noncooperation.  Cardiovascular - Regular rhythm and mild tachycardia.  Neuro - drowsy sleepy and not open eyes on voice. Very low voice with hypophonia and attempted to answer questions but not orientated. Seems able to repeat 3-word sentences, but very hypophonic. With forced eye opening, PERRL, right gaze preference, not cross midline. Not blinking to visual threat bilaterally. Left facial droop. Tongue protrusion not cooperative. Right UE and LE some spontaneous  movement but not against gravity, LUE and LLE plegic. Sensation, coordination not cooperative and gait not tested.   ASSESSMENT/PLAN Donald [name] is a 65 y.o. male with history of dementia with behavioral disturbance and OSA on CPAP admitted for right gaze and left hemoplegia, left facial droop. 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 He was stabilized and sent to CIR. 6/12 found to be lethargic and drowsy, CT repeat showed increased cerebral edema and midline shift. EEG no seizure s/p ativan and keppra. He was transfer back to ICU for further management.   Cerebral edema  CT head 6/12 showed progression of right frontal ICH and cerebral edema with midline shift CTA head and neck 6/12 no discrete AVM or spot sign On 3% saline  Na Q6h Na 135-> Na goal 145-150  ? 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 No CSVT on venous phase CT repeat mildly increased left frontal ICH 05/12/21 MRI brain no significant change of ICH since last CT MRV Non dominant right transverse and sigmoid dural venous sinuses. Cerebral angiogram pending 2D Echo  EF 65-70% LDL 140 HgbA1c 5.8 UDS neg Heparin subq for VTE prophylaxis No antithrombotic prior to admission, now on No antithrombotic given ICH Ongoing aggressive stroke risk factor management Therapy recommendations:  pending Disposition:  Pending   ?? 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  Dementia  Has appointment with [facility] for LP and evaluation of dementia, but pt not able to go due to onset of ICH Delirium precaution Continue outpt follow up with [facility]  Hyperlipidemia Home meds:  none  LDL 140, goal < 70 Will consider low dose statin (lipitor 20) on discharge   Dysphagia  Speech to follow NPO now Will consider cortrak if not passing swallow TF after angiogram procedure  Fever and leukocytosis Tmax 101.4->afebrile Leukocytosis WBC 7.6->12.4 ->8.1->7.8->22.9 CCM on board Continue monitoring UA pending CXR pending  Other Stroke Risk Factors Advanced age Obstructive sleep apnea, on CPAP at home  Other Active Problems   [name] day # 1  This patient is critically ill due to ICH, midline shift, cerebral edema, seizure activity, fever and leukocytosis and at significant risk of neurological worsening, death form brain herniation, status epilepticus, sepsis, recurrent ICH, septic shock. This patient's care requires constant monitoring of vital signs, hemodynamics, respiratory and cardiac monitoring, review of multiple databases, neurological assessment, discussion with family, other specialists and medical decision making of high complexity. I spent 40 minutes of neurocritical care time in the care of this patient. I had long discussion with wife at bedside, updated pt current condition, treatment plan and potential prognosis, and answered all the questions. She expressed understanding and appreciation. I also discussed with Dr. Denese Killings CCM.    Marvel Plan, MD PhD Stroke Neurology 05/24/2021 9:55 AM    To contact Stroke Continuity provider, please refer to [email or web address]. After hours, contact General Neurology

## 2021-05-24 NOTE — Consult Note (Signed)
Chief Complaint: Patient was seen in consultation today for ICH  Referring Physician(s): Dr. Arbutus Leas  Supervising Physician: Julieanne Cotton  Patient Status: Donald [name]  History of Present Illness: Donald [name] is a 65 y.o. male with history of dementia with behavioral disturbance and OSA on CPAP who was in his usual state of health Saturday with change in mentation- lethargy, drowsy on Sunday.  Wife brought to the ED where he was found to have pominent cerebral edema with ICH and midline shirt. He had right gaze deviation with decreased responsiveness concerning for seizure-like activity and was therefore started on Keppra with EEG.  He is now on 3% NS infusion, followed by Neurology. IR consulted for diagnostic cerebral angiogram.   Past Medical History:  Diagnosis Date   Arthritis     Past Surgical History:  Procedure Laterality Date   APPENDECTOMY      Allergies: Sulfa antibiotics  Medications: Prior to Admission medications   Medication Sig Start Date End Date Taking? Authorizing Provider  amLODipine (NORVASC) 10 MG tablet Take 1 tablet (10 mg total) by mouth daily. 05/20/21   Pokhrel, Rebekah Chesterfield, MD  atorvastatin (LIPITOR) 20 MG tablet Take 1 tablet (20 mg total) by mouth daily. 05/19/21 05/19/22  Pokhrel, Rebekah Chesterfield, MD  hydrochlorothiazide (MICROZIDE) 12.5 MG capsule Take 1 capsule (12.5 mg total) by mouth daily. 05/20/21   Pokhrel, Rebekah Chesterfield, MD  Multiple Vitamins-Minerals (MENS 50+ MULTI VITAMIN/MIN PO) Take 1 Dose by mouth in the morning and at bedtime.    [provider]  pantoprazole (PROTONIX) 40 MG tablet Take 1 tablet (40 mg total) by mouth daily. 05/20/21   Pokhrel, Rebekah Chesterfield, MD  polyethylene glycol (MIRALAX / GLYCOLAX) 17 g packet Take 17 g by mouth 2 (two) times daily. 05/19/21   Pokhrel, Rebekah Chesterfield, MD  senna-docusate (SENOKOT-S) 8.6-50 MG tablet Take 1 tablet by mouth 2 (two) times daily. 05/19/21   Pokhrel, Rebekah Chesterfield, MD     Family History  Problem Relation Age  of Onset   Hypertension Mother    Colon polyps Neg Hx    Colon cancer Neg Hx    Esophageal cancer Neg Hx    Rectal cancer Neg Hx    Stomach cancer Neg Hx     Social History   Socioeconomic History   Marital status: Married    Spouse name: Not on file   Number of children: Not on file   Years of education: Not on file   Highest education level: Not on file  Occupational History   Not on file  Tobacco Use   Smoking status: Never   Smokeless tobacco: Never  Substance and Sexual Activity   Alcohol use: No   Drug use: No   Sexual activity: Yes    Birth control/protection: None  Other Topics Concern   Not on file  Social History Narrative   Not on file   Social Determinants of Health   Financial Resource Strain: Not on file  Food Insecurity: Not on file  Transportation Needs: Not on file  Physical Activity: Not on file  Stress: Not on file  Social Connections: Not on file     Review of Systems: A 12 point ROS discussed and pertinent positives are indicated in the HPI above.  All other systems are negative.  Review of Systems  Unable to perform ROS: Mental status change   Vital Signs: BP 132/84 (BP Location: Right Arm)   Pulse (!) 104   Temp 98.8 F (37.1 C) (Oral)  Resp (!) 27   Wt 146 lb 9.7 oz (66.5 kg)   SpO2 99%   BMI 24.40 kg/m   Physical Exam Vitals and nursing note reviewed.  Constitutional:      General: He is not in acute distress.    Appearance: Normal appearance. He is ill-appearing.  HENT:     Mouth/Throat:     Mouth: Mucous membranes are moist.     Pharynx: Oropharynx is clear.  Cardiovascular:     Rate and Rhythm: Regular rhythm. Tachycardia present.  Pulmonary:     Effort: Pulmonary effort is normal. No respiratory distress.     Breath sounds: Normal breath sounds.  Skin:    General: Skin is warm and dry.  Neurological:     Mental Status: He is alert.     Comments: Lethargy, drowsy, unable to participate     MD  Evaluation Airway: WNL Heart: WNL Abdomen: WNL Chest/ Lungs: WNL ASA  Classification: 3 Mallampati/Airway Score: One   Imaging: DG Abd 1 View  Result Date: 05/20/2021 CLINICAL DATA:  Abdominal pain. EXAM: ABDOMEN - 1 VIEW COMPARISON:  None. FINDINGS: Large amount of intestinal gas throughout small and large bowel suggesting ileus. There does not appear to be a large amount of fecal matter. No focal obstruction is evident. No free air. No abnormal calcifications or significant bone findings. IMPRESSION: Large amount of intestinal gas suggesting ileus. Electronically Signed   By: Paulina Fusi M.D.   On: 05/20/2021 19:25   CT HEAD WO CONTRAST  Result Date: 05/23/2021 CLINICAL DATA:  Follow-up intracerebral hemorrhage EXAM: CT HEAD WITHOUT CONTRAST TECHNIQUE: Contiguous axial images were obtained from the base of the skull through the vertex without intravenous contrast. COMPARISON:  CT 05/23/2021 FINDINGS: Brain: No significant interval change in the overall size of the intraparenchymal hemorrhagic component seen in the right frontal with some extensive surrounding hypo attenuating edematous changes. Locoregional mass effect with sulcal effacement and effacement of the right lateral ventricle as well as stable subfalcine herniation with a right to left midline shift of approximately 14 mm. Slight medialization of the right uncus without clear transtentorial herniation is also unchanged from prior. Basal cisterns remain patent. No new sites of hemorrhage. No new loss of gray-white differentiation. Vascular: No hyperdense vessel or unexpected calcification. Skull: Normal. Negative for fracture or focal lesion. EEG leads in place. Sinuses/Orbits: Mild mural thickening in the maxillary sinuses. Paranasal sinuses and mastoid air cells are otherwise predominantly clear. Included orbital structures are unremarkable. Other: None. IMPRESSION: 1. Stable appearance of the right frontal lobe intraparenchymal  hemorrhage with surrounding cerebral edema, local mass effect as well as subfalcine midline shift of approximately 14 mm in slight medial ization of the right uncus, not significantly changed from comparison imaging. 2. No new sites of hemorrhage or new gray-white differentiation loss. Electronically Signed   By: Kreg Shropshire M.D.   On: 05/23/2021 23:58   CT HEAD WO CONTRAST  Result Date: 05/23/2021 CLINICAL DATA:  Follow-up right frontal parenchymal hemorrhage seen earlier today. EXAM: CT HEAD WITHOUT CONTRAST TECHNIQUE: Contiguous axial images were obtained from the base of the skull through the vertex without intravenous contrast. COMPARISON:  Earlier today. FINDINGS: Brain: No significant change in the previously described high right frontal lobe parenchymal hematoma with extensive surrounding edema and midline shift to the left. The midline shift currently measures 10 mm on image number 16/3. No interval ventricular enlargement. No new hemorrhage. Vascular: No hyperdense vessel or unexpected calcification. Skull: Normal. Negative for fracture  or focal lesion. Sinuses/Orbits: Stable small left frontal sinus osteoma. Mild moderate left maxillary sinus mucosal thickening without significant change. Unremarkable orbits. Other: Bilateral concha bullosa. IMPRESSION: 1. Stable large high right frontal parenchymal hematoma with extensive associated edema and midline shift to the left. 2. Stable left maxillary chronic sinusitis. Electronically Signed   By: Beckie Salts M.D.   On: 05/23/2021 18:00   CT HEAD WO CONTRAST  Result Date: 05/23/2021 CLINICAL DATA:  Cerebral hemorrhage, decreased responsiveness EXAM: CT HEAD WITHOUT CONTRAST TECHNIQUE: Contiguous axial images were obtained from the base of the skull through the vertex without intravenous contrast. COMPARISON:  Earlier same day FINDINGS: Brain: Parenchymal hematoma centered within the high right frontal lobe does not measure substantially changed from  the prior study. Extensive surrounding edema is again noted with regional mass effect. Leftward midline shift measures similar at 14 mm at the level of the septum pellucidum. Ventricle caliber is unchanged with no new trapping. No significant central herniation. There is no new loss of gray-white differentiation. Intraventricular hemorrhage is identified. Vascular: No new findings. Skull: Unremarkable. Sinuses/Orbits: No acute finding. Other: None. IMPRESSION: No substantial change in right frontal parenchymal hemorrhage, edema, and mass effect including midline shift. No new ventricle trapping or acute infarction. Electronically Signed   By: Guadlupe Spanish M.D.   On: 05/23/2021 12:49   CT HEAD WO CONTRAST  Result Date: 05/11/2021 CLINICAL DATA:  Stroke.  Intracranial hemorrhage follow-up. EXAM: CT HEAD WITHOUT CONTRAST TECHNIQUE: Contiguous axial images were obtained from the base of the skull through the vertex without intravenous contrast. COMPARISON:  Same day head CT. FINDINGS: Brain: Mild interval increase in the size of the right frontal intraparenchymal hemorrhage, measuring 4.5 x 2.6 x 3.5 cm (previously 4.2 x 2.3 x 3.1 cm). Similar adjacent small volume subarachnoid hemorrhage. A small amount of subdural hemorrhage in this region is also not excluded. Surrounding edema is similar. Similar local mass effect without midline shift. Basal cisterns are patent. No evidence of acute large vascular territory infarct elsewhere. Similar mild white matter hypodensities, likely chronic microvascular ischemic disease. No hydrocephalus. Vascular: No hyperdense vessel.  Calcific atherosclerosis. Skull: No acute fracture. Sinuses/Orbits: Small left frontal sinus osteoma. Mild right and moderate left maxillary sinus mucosal thickening. Unremarkable orbits. Other: No mastoid effusions. IMPRESSION: Mild interval increase in the size of the right frontal intraparenchymal hemorrhage, measuring 4.5 x 2.6 x 3.5 cm  (previously 4.2 x 2.3 x 3.1 cm). Similar adjacent small volume hemorrhage. Electronically Signed   By: Feliberto Harts MD   On: 05/11/2021 17:05   MR BRAIN WO CONTRAST  Result Date: 05/12/2021 CLINICAL DATA:  65 year old male code stroke presentation with intra-axial right hemisphere hemorrhage. Hypertensive (194/104) on presentation. EXAM: MRI HEAD WITHOUT CONTRAST TECHNIQUE: Multiplanar, multiecho pulse sequences of the brain and surrounding structures were obtained without intravenous contrast. COMPARISON:  CT head CTA head and neck 05/11/2021 FINDINGS: Brain: Coronal T2 weighted imaging could not be obtained. And some of the exam is intermittently degraded by motion artifact despite repeated imaging attempts. T1 isointense intra-axial hemorrhage in the posterior right frontal lobe demonstrates a layering hematocrit level (series 13, image 19) and blood encompasses 43 by 38 x 33 mm (AP by transverse by CC) for an estimated volume of 26 mL. Regional edema. Mild regional mass effect. Trace subarachnoid hemorrhage also suspected on axial FLAIR imaging. Additionally, on SWI there is also chronic hemosiderin in the right parietal lobe on series 19 image 34. But no other definite chronic blood products.  Susceptibility related abnormal diffusion in the posterior right frontal lobe with no convincing larger area of restricted diffusion. No restricted diffusion elsewhere. No IVH or ventriculomegaly. Normal basilar cisterns. Negative pituitary and cervicomedullary junction. Wallace CullensGray and white matter signal outside of the affected right hemisphere is largely normal for age with mild nonspecific white matter changes. Vascular: Major intracranial vascular flow voids are preserved. Skull and upper cervical spine: Visualized bone marrow signal is within normal limits. Grossly negative cervical spine. Sinuses/Orbits: Negative orbits. Mild to moderate maxillary sinus mucosal thickening. Other: Mastoids are clear. Grossly normal  visible internal auditory structures. IMPRESSION: 1. Posterior right frontal lobe intra-axial hemorrhage with layering hematocrit level has not significantly changed (estimated blood volume of 26 mL). Surrounding edema and mild regional mass effect. Trace subarachnoid hemorrhage. 2. No larger underlying infarct is evident. But chronic hemosiderin in the right parietal lobe indicates a previous intra-axial hemorrhage in the region. 3. No other acute intracranial abnormality. Electronically Signed   By: Odessa FlemingH  Hall M.D.   On: 05/12/2021 07:26   DG CHEST PORT 1 VIEW  Result Date: 05/24/2021 CLINICAL DATA:  Leukocytosis EXAM: PORTABLE CHEST 1 VIEW COMPARISON:  04/28/15 FINDINGS: Cardiac shadow is within normal limits. Mild aortic calcifications are noted. The lungs are well aerated bilaterally with minimal left basilar atelectasis. No bony abnormality is seen. IMPRESSION: Minimal left basilar atelectasis. Electronically Signed   By: Alcide CleverMark  Lukens M.D.   On: 05/24/2021 10:52   EEG adult  Result Date: 05/23/2021 Charlsie QuestYadav, Priyanka O, MD     05/23/2021  4:11 PM Patient Name: Melanee Leftmmanuel Guadron MRN: [id number] Epilepsy Attending: Charlsie QuestPriyanka O Yadav Referring Physician/Provider: Dr Bing Neighborsolleen Stack Date: 05/23/2021 Duration: 25.36 mins Patient history:  65 y.o. male currently admitted for ICH on 05/11/21 developed somnolence and aphasia with R gaze deviation that resolved with ativan. EEG to evaluate for seizure Level of alertness: asleep AEDs during EEG study: LEV Technical aspects: This EEG study was done with scalp electrodes positioned according to the 10-20 International system of electrode placement. Electrical activity was acquired at a sampling rate of 500Hz  and reviewed with a high frequency filter of 70Hz  and a low frequency filter of 1Hz . EEG data were recorded continuously and digitally stored. Description: No posterior dominant rhythm was seen. Sleep was characterized by sleep spindles (12 to 14 Hz), maximal frontocentral  region. EEG showed continuous generalized 3 to 6 Hz theta-delta slowing. There is an excessive amount of 15 to 18 Hz, 2-3 uV beta activity distributed symmetrically and diffusely.  Hyperventilation and photic stimulation were not performed.   ABNORMALITY - Continuous slow, generalized - Excessive beta, generalized IMPRESSION: This study is suggestive of moderate to severe diffuse encephalopathy, nonspecific etiology. The excessive beta activity seen in the background is most likely due to the effect of benzodiazepine and is a benign EEG pattern. No seizures or epileptiform discharges were seen throughout the recording. Charlsie Questriyanka O Yadav   MR MRV HEAD W WO CONTRAST  Result Date: 05/12/2021 CLINICAL DATA:  Intracranial hemorrhage. EXAM: MR VENOGRAM HEAD WITHOUT AND WITH CONTRAST TECHNIQUE: Angiographic images of the intracranial venous structures were acquired using MRV technique without and with intravenous contrast. CONTRAST:  7mL GADAVIST GADOBUTROL 1 MMOL/ML IV SOLN COMPARISON:  Brain MRI performed earlier today 05/12/2021. Noncontrast head CT examinations 05/11/2021. CT angiogram head/neck 05/11/2021. FINDINGS: The superior sagittal sinus, internal cerebral veins, vein of Galen, straight sinus, transverse sinuses, sigmoid sinuses and visualized jugular veins are patent. No appreciable intracranial venous thrombosis. Hypoplastic right transverse and sigmoid dural  venous sinuses. An acute parenchymal hemorrhage within the posterior right frontal lobe is grossly unchanged in size as compared to the brain MRI performed earlier today. There is mild curvilinear vascular enhancement overlying the site of hemorrhage, which may be reactive. Elsewhere, no abnormal intracranial enhancement is identified. IMPRESSION: No evidence of intracranial venous thrombosis. Non dominant right transverse and sigmoid dural venous sinuses. Electronically Signed   By: Jackey Loge DO   On: 05/12/2021 11:27   ECHOCARDIOGRAM  COMPLETE  Result Date: 05/11/2021    ECHOCARDIOGRAM REPORT   Patient Name:   [facility] Date of Exam: 05/11/2021 Medical Rec #:  [id number]        Height:       68.0 in Accession #:    [id number]       Weight:       155.2 lb Date of Birth:  07-18-1956         BSA:          1.835 m Patient Age:    65 years         BP:           115/77 mmHg Patient Gender: M                HR:           84 bpm. Exam Location:  Inpatient Procedure: 2D Echo, Cardiac Doppler and Color Doppler Indications:    Stroke I63.9  History:        Patient has no prior history of Echocardiogram examinations.  Sonographer:    Roosvelt Maser RDCS Referring Phys: [id number] Reyne Dumas [facility] IMPRESSIONS  1. Left ventricular ejection fraction, by estimation, is 65 to 70%. The left ventricle has normal function. The left ventricle has no regional wall motion abnormalities. Left ventricular diastolic parameters were normal.  2. Right ventricular systolic function is normal. The right ventricular size is normal. Tricuspid regurgitation signal is inadequate for assessing PA pressure.  3. The mitral valve is normal in structure. No evidence of mitral valve regurgitation.  4. The aortic valve was not well visualized. Aortic valve regurgitation is not visualized. No aortic stenosis is present.  5. The inferior vena cava is normal in size with greater than 50% respiratory variability, suggesting right atrial pressure of 3 mmHg. FINDINGS  Left Ventricle: Left ventricular ejection fraction, by estimation, is 65 to 70%. The left ventricle has normal function. The left ventricle has no regional wall motion abnormalities. The left ventricular internal cavity size was normal in size. There is  no left ventricular hypertrophy. Left ventricular diastolic parameters were normal. Right Ventricle: The right ventricular size is normal. No increase in right ventricular wall thickness. Right ventricular systolic function is normal. Tricuspid regurgitation signal is inadequate  for assessing PA pressure. Left Atrium: Left atrial size was normal in size. Right Atrium: Right atrial size was normal in size. Pericardium: There is no evidence of pericardial effusion. Mitral Valve: The mitral valve is normal in structure. No evidence of mitral valve regurgitation. Tricuspid Valve: The tricuspid valve is normal in structure. Tricuspid valve regurgitation is not demonstrated. Aortic Valve: The aortic valve was not well visualized. Aortic valve regurgitation is not visualized. No aortic stenosis is present. Aortic valve mean gradient measures 3.0 mmHg. Aortic valve peak gradient measures 6.7 mmHg. Aortic valve area, by VTI measures 2.77 cm. Pulmonic Valve: The pulmonic valve was not well visualized. Pulmonic valve regurgitation is not visualized. Aorta: The aortic root and ascending aorta are structurally normal,  with no evidence of dilitation. Venous: The inferior vena cava is normal in size with greater than 50% respiratory variability, suggesting right atrial pressure of 3 mmHg. IAS/Shunts: The interatrial septum was not well visualized.  LEFT VENTRICLE PLAX 2D LVIDd:         4.90 cm  Diastology LVIDs:         2.80 cm  LV e' medial:    8.38 cm/s LV PW:         0.80 cm  LV E/e' medial:  9.0 LV IVS:        0.90 cm  LV e' lateral:   9.14 cm/s LVOT diam:     1.90 cm  LV E/e' lateral: 8.2 LV SV:         69 LV SV Index:   37 LVOT Area:     2.84 cm  RIGHT VENTRICLE RV Basal diam:  2.90 cm LEFT ATRIUM           Index       RIGHT ATRIUM           Index LA diam:      3.10 cm 1.69 cm/m  RA Area:     11.20 cm LA Vol (A2C): 26.8 ml 14.61 ml/m RA Volume:   21.90 ml  11.94 ml/m LA Vol (A4C): 39.2 ml 21.36 ml/m  AORTIC VALVE AV Area (Vmax):    2.64 cm AV Area (Vmean):   2.59 cm AV Area (VTI):     2.77 cm AV Vmax:           129.00 cm/s AV Vmean:          86.700 cm/s AV VTI:            0.248 m AV Peak Grad:      6.7 mmHg AV Mean Grad:      3.0 mmHg LVOT Vmax:         120.00 cm/s LVOT Vmean:         79.200 cm/s LVOT VTI:          0.242 m LVOT/AV VTI ratio: 0.98  AORTA Ao Root diam: 2.50 cm Ao Asc diam:  3.30 cm MITRAL VALVE MV Area (PHT): 3.60 cm     SHUNTS MV Decel Time: 211 msec     Systemic VTI:  0.24 m MV E velocity: 75.30 cm/s   Systemic Diam: 1.90 cm MV A velocity: 107.00 cm/s MV E/A ratio:  0.70 Epifanio Lesches MD Electronically signed by Epifanio Lesches MD Signature Date/Time: 05/11/2021/5:28:15 PM    Final    CT HEAD CODE STROKE WO CONTRAST  Result Date: 05/23/2021 CLINICAL DATA:  Code stroke.  Acute stroke. EXAM: CT HEAD WITHOUT CONTRAST TECHNIQUE: Contiguous axial images were obtained from the base of the skull through the vertex without intravenous contrast. COMPARISON:  Head CT from 05/11/2021 FINDINGS: Brain: 5.5 x 2.8 cm hematoma in the subcortical high right frontal region with extensive vasogenic edema in the adjacent brain that is progressed. There is worsening local mass effect with midline shift now measuring 13 mm. No entrapment. No cytotoxic edema seen. Vascular: No hyperdense vessel or unexpected calcification. Skull: Normal. Negative for fracture or focal lesion. Sinuses/Orbits: Negative Other: Critical Value/emergent results were called by telephone at the time of interpretation on 05/23/2021 at 9:10 am to provider [facility] , who verbally acknowledged these results. ASPECTS [facility] Stroke Program Early CT Score) Not scored in this setting IMPRESSION: Progressed right frontal hematoma and vasogenic edema with midline  shift now measuring 13 mm. Electronically Signed   By: Marnee Spring M.D.   On: 05/23/2021 09:12   CT HEAD CODE STROKE WO CONTRAST  Result Date: 05/11/2021 CLINICAL DATA:  Code stroke. Left-sided weakness, nausea, and vomiting. EXAM: CT HEAD WITHOUT CONTRAST TECHNIQUE: Contiguous axial images were obtained from the base of the skull through the vertex without intravenous contrast. COMPARISON:  None. FINDINGS: Brain: An acute parenchymal hemorrhage in  the high posterior right frontal lobe measures 4.2 x 2.3 x 3.1 cm (approximate volume of 15 mL). There is mild surrounding edema without midline shift, and there is a small amount of adjacent subarachnoid hemorrhage. A very small amount of coexistent subdural hemorrhage is also not excluded in this region. No acute infarct is identified elsewhere. The ventricles are normal in size. Hypodensities in the cerebral white matter bilaterally are nonspecific but compatible with mild chronic small vessel ischemic disease. Vascular: No hyperdense vessel. Skull: No fracture or suspicious osseous lesion. Sinuses/Orbits: Small osteoma in the left frontal sinus. Mild right and moderate left maxillary sinus mucosal thickening. Clear mastoid air cells. Unremarkable orbits. Other: None. ASPECTS [facility] Stroke Program Early CT Score) Not scored in the presence of acute hemorrhage. IMPRESSION: Acute parenchymal hemorrhage in the posterior right frontal lobe with small amount of adjacent subarachnoid hemorrhage. These results were communicated to Dr. Iver Nestle at 9:08 am on 05/11/2021 by text page via the [facility] messaging system. Electronically Signed   By: Sebastian Ache M.D.   On: 05/11/2021 09:12   CT ANGIO HEAD NECK W WO CM (CODE STROKE)  Result Date: 05/23/2021 CLINICAL DATA:  Enlarging hematoma EXAM: CT ANGIOGRAPHY HEAD AND NECK TECHNIQUE: Multidetector CT imaging of the head and neck was performed using the standard protocol during bolus administration of intravenous contrast. Multiplanar CT image reconstructions and MIPs were obtained to evaluate the vascular anatomy. Carotid stenosis measurements (when applicable) are obtained utilizing NASCET criteria, using the distal internal carotid diameter as the denominator. CONTRAST:  50mL OMNIPAQUE IOHEXOL 350 MG/ML SOLN COMPARISON:  CTA 05/11/2021 FINDINGS: CTA NECK FINDINGS Aortic arch: Normal Right carotid system: Widely patent vessels with smooth contour. No atheromatous changes  Left carotid system: Widely patent vessels with smooth contour. No noted atheromatous changes Vertebral arteries: No proximal subclavian stenosis. The vertebral arteries are widely patent. No suspected vertebral beading when allowing for streak artifact. Skeleton: Multilevel degenerative disc narrowing and ridging canal and foraminal encroachment the lower cervical levels. Other neck: No evidence of inflammation or mass. Upper chest: Negative Review of the MIP images confirms the above findings CTA HEAD FINDINGS Anterior circulation: Major vessels are smooth and widely patent. No branch occlusion, generalized beading, or aneurysm. There is a cortical branch at the right frontal parietal convexity which is not continuous beyond the level of the upper and lateral hematoma. No visible nidus or early enhancing veins. Posterior circulation: Vertebral and basilar arteries are smooth and widely patent. No branch occlusion, beading, or aneurysm. Venous sinuses: MRV was obtained previously. The veins are largely nonenhancing on this study. Anatomic variants: None significant Review of the MIP images confirms the above findings IMPRESSION: Seemingly truncated arterial cortical branch at the upper and lateral hematoma, without discrete vascular malformation or spot sign. Recommend follow-up after resolution of mass effect to evaluate for small AVM. Electronically Signed   By: Marnee Spring M.D.   On: 05/23/2021 09:54   CT ANGIO HEAD NECK W WO CM (CODE STROKE)  Result Date: 05/11/2021 CLINICAL DATA:  Stroke follow-up.  Left-sided weakness.  EXAM: CT ANGIOGRAPHY HEAD AND NECK TECHNIQUE: Multidetector CT imaging of the head and neck was performed using the standard protocol during bolus administration of intravenous contrast. Multiplanar CT image reconstructions and MIPs were obtained to evaluate the vascular anatomy. Carotid stenosis measurements (when applicable) are obtained utilizing NASCET criteria, using the distal  internal carotid diameter as the denominator. CONTRAST:  30mL OMNIPAQUE IOHEXOL 350 MG/ML SOLN COMPARISON:  Same day CT head. FINDINGS: CTA NECK FINDINGS Aortic arch: Great vessel origins are patent. Right carotid system: No evidence of dissection, stenosis (50% or greater) or occlusion. Mild apparent irregularity of the ICA at the skull base in a region of streak artifact. Left carotid system: No evidence of dissection, stenosis (50% or greater) or occlusion. Mild apparent irregularity of the ICA at the skull base in a region of streak artifact. Vertebral arteries: Codominant. No evidence of dissection, stenosis (50% or greater) or occlusion. Skeleton: Moderate degenerative disc disease at at C5-C6 and C6-C7 with disc height loss, endplate sclerosis and posterior disc osteophyte complexes. Other neck: No acute abnormality Upper chest: Visualized lung apices are clear. Review of the MIP images confirms the above findings CTA HEAD FINDINGS Anterior circulation: No large vessel occlusion or proximal hemodynamically significant stenosis. Evaluation of the distal vasculature is limited due to venous contamination. No evidence of and arteriovenous malformation or aneurysm in the region of intraparenchymal hemorrhage, although acute blood products limit evaluation. Posterior circulation: No large vessel occlusion or proximal hemodynamically significant stenosis. Venous sinuses: The superior sagittal sinus is narrowed in the region of hemorrhage, most likely from mass effect. Small right transverse and sigmoid sinuses. Review of the MIP images confirms the above findings IMPRESSION: CTA Head: 1. No large vessel occlusion or hemodynamically significant proximal arterial stenosis. 2. The superior sagittal sinus is narrowed in the region of hemorrhage with poorly visualized draining cortical veins in this region. While indeterminate, this may be secondary to mass effect given no definite/clear intraluminal thrombus. Given  these findings and the location of hemorrhage; however, recommend low threshold for follow-up CTV or MRI with contrast to ensure stability and exclude worsening thrombosis. 3. No evidence of and arteriovenous malformation or aneurysm in the region of intraparenchymal hemorrhage, although acute blood products limit evaluation. Follow-up imaging after resolution of hemorrhage could provide further assessment if clinically indicated. CTA Neck: 1. No significant (greater than 50%) stenosis. 2. Mild apparent irregularity of bilateral ICAs at the skull base is favored artifactual given streak artifact from dental amalgam in this region. Fibromuscular dysplasia is a differential consideration. Findings and recommendations discussed with Dr. Iver Nestle via telephone at 9:59 a.m. Electronically Signed   By: Feliberto Harts MD   On: 05/11/2021 10:09    Labs:  CBC: Recent Labs    05/14/21 0153 05/18/21 0446 05/21/21 0532 05/24/21 0134  WBC 7.8 6.6 8.7 22.9*  HGB 15.5 17.0 16.2 16.3  HCT 47.0 50.1 47.5 49.6  PLT 237 257 292 309    COAGS: Recent Labs    05/11/21 0851  INR 1.0  APTT 26    BMP: Recent Labs    09/11/20 1449 05/11/21 0851 05/14/21 0153 05/18/21 0446 05/21/21 0532 05/24/21 0134 05/24/21 0838  NA 143   < > 138 134* 134* 135 136  K 4.3   < > 3.8 4.6 4.3 4.4  --   CL 103   < > 105 95* 95* 96*  --   CO2 26   < > 24 28 29 27   --   GLUCOSE  96   < > 108* 116* 133* 135*  --   BUN 10   < > 27*  --   CALCIUM 10.1   < > 9.5 10.1 10.0 10.1  --   CREATININE 1.17   < > 1.03 1.23 1.20 1.45*  --   GFRNONAA 66   < > >60 >60 >60 54*  --   GFRAA 76  --   --   --   --   --   --    < > = values in this interval not displayed.    LIVER FUNCTION TESTS: Recent Labs    09/11/20 1449 05/11/21 0851 05/12/21 0341 05/21/21 0532  BILITOT 0.5 0.7 0.8 0.9  AST ALT ALKPHOS 124* 94 108 100  PROT 7.6 6.7 7.5 7.7  ALBUMIN 4.8 3.9 4.3 4.0    TUMOR  MARKERS: No results for input(s): AFPTM, CEA, CA199, CHROMGRNA in the last 8760 hours.  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  CTA Head and Neck was negative for LVO, AVM, or aneurysm.  However, now returns with worsening/recurrent ICH. IR consulted for diagnostic angiogram.  Patient lethargic today.  Does not participate in exam.  VSS, tachycardia noted.  On 3% NS, keppra.  EEG underway.  Wife and bedside updated on procedure plans today.  Brother also updated by phone per wife's requested.  Note SCr of 1.45, prior baseline 1.02-1.23  Thank you for this interesting consult.  I greatly enjoyed meeting Harce Volden and look forward to participating in their care.  A copy of this report was sent to the requesting provider on this date.  Electronically Signed: Hoyt Koch, PA 05/24/2021, 11:06 AM   I spent a total of 40 Minutes    in face to face in clinical consultation, greater than 50% of which was counseling/coordinating care for intra-cranial hemorrhage.

## 2021-05-24 NOTE — Progress Notes (Addendum)
NAME:  Donald [name], MRN:  [id number], DOB:  02-21-1956, LOS: 1 ADMISSION DATE:  05/23/2021, CONSULTATION DATE:  05/24/2021 REFERRING MD:  Admitted from Rehab, CHIEF COMPLAINT:  altered mental status   History of Present Illness:  Donald [name] is a 65 year old gentleman with recent intracranial hemorrhage who presents from rehab with worsening mental status and seizures.  He initially presented May 31 with new onset left-sided hemiparesis, right gaze, and left mild facial droop.  He was found to have acute intracranial hemorrhage of the posterior right frontal lobe.  He was medically managed with blood pressure control and discharged to inpatient rehab.  He had just finished his first day of intensive rehab.  At rehab he was sitting up, eating with assistance, able to talk.  This morning he was being fed breakfast with assistance and his nursing tech noted difficulty swallowing, facial droop, decreased mentation.  Code stroke was called.  CT head shows enlargement of his existing right intraparenchymal hemorrhage, with worsening midline shift.  He was transferred to the 4n ICU for ongoing blood pressure management and serial CT scans.  Of note he was receiving outpatient work-up for early onset Alzheimer's at [facility] prior to this hemorrhage.  Pertinent  Medical History  Hypertension Mild cognitive decline Intracranial hemorrhage  Significant Hospital Events: Including procedures, antibiotic start and stop dates in addition to other pertinent events   6/12 admitted from inpatient rehab for acute worsening of intracranial hemorrhage of the right frontal lobe   Interim History / Subjective:  Admit  Tmax 101.4  EEG ongoing  -490 since admit, 860 UOP  50 ml 3% drip.  Unable to obtain subjective evaluation due to patient status  Objective   Blood pressure 132/84, pulse (!) 104, temperature 98.8 F (37.1 C), temperature source Oral, resp. rate (!) 27, weight 66.5 kg, SpO2 99 %.         Intake/Output Summary (Last 24 hours) at 05/24/2021 0853 Last data filed at 05/24/2021 0600 Gross per 24 hour  Intake 369.26 ml  Output 860 ml  Net -490.74 ml   Filed Weights   05/24/21 0500  Weight: 66.5 kg    Examination: General:  In bed, no acute distress, appears comfortable HEENT: MM pink/moist, anicteric, atraumatic Neuro: no movement on left, localizing on right, inappropriate words, no eye opening, PERRL 54mm CV: S1S2, NS, no m/r/g appreciated PULM:  clear in the upper lobes and in the lower lobes, chest expansion symmetric, trachea midline  GI: soft, bsx4 active, non distended Extremities: warm/dry, no edema, capillary refill less than 3 seconds  Skin: no rashes or lesions noted  Labs/imaging that I havepersonally reviewed  (right click and "Reselect all SmartList Selections" daily)  Reviewed CT Head with Dr. Denese Killings compared to previous admission CT. BMO- NA 135 CBC- leukocytosis MG- WNL PHOS- WNL  Resolved Hospital Problem list     Assessment & Plan:   Donald [name] is 65 year old gentleman with recent intracranial hemorrhage now with worsening mental status changes and rebleeding of his right frontal lobe hemorrhage  Intracranial hemorrhage midline shift of the right frontal lobe Cerebral Edema ?Seizure As seen on 6/12 CT head, edema on CT head -To NIR today for cerebral angiogram to evaluate for AVM -Appreciate neurology assistance and management. Workup of ICH per neurology -On keppra for seizure ppx -Continue EEG. Follow up results -3% NS started at 68ml/hr. NA goal 145-150. Follow q6 NA.  -Continue neuroprotective measures- normothermia, euglycemia, HOB greater than 30, head in neutral alignment, normocapnia, normoxia.  -  SBP goal 110-140. Cleviprex ordered for HTN. Titrate to goal.  Dysphagia S/p ICH -Appreciate SLP evaluation -SLP to place cortrak  Leukocytosis Tmax 101.4, WBX 8.7>22.9, ?SIRS -Follow fever/wbc trend -AM CBC -Follow up UA and  CXR  HLD LDL 140 -Mgmt per Neuro, goal <70, starting on low dose statin on discharge  Dementia Was in process of workup for early onset dementia at [facility] -Follow up with [facility] post discharge  Best practice (right click and "Reselect all SmartList Selections" daily)  Diet:  NPO Pain/Anxiety/Delirium protocol (if indicated): No VAP protocol (if indicated): Not indicated DVT prophylaxis: SCD GI prophylaxis: N/A Glucose control:  SSI No Central venous access:  N/A Arterial line:  N/A Foley:  N/A Mobility:  bed rest  PT consulted: Yes Last date of multidisciplinary goals of care discussion [5/13 at bedside with wife] Code Status:  full code Disposition: ICU   Critical care time: 31 minutes     The patient is critically ill due to encephalopathy, intracranial hemorrhage.  Critical care was necessary to treat or prevent imminent or life-threatening deterioration.  Critical care was time spent personally by me on the following activities: development of treatment plan with patient and/or surrogate as well as nursing, discussions with consultants, evaluation of patient's response to treatment, examination of patient, obtaining history from patient or surrogate, ordering and performing treatments and interventions, ordering and review of laboratory studies, ordering and review of radiographic studies, pulse oximetry, re-evaluation of patient's condition and participation in multidisciplinary rounds.   Critical Care Time devoted to patient care services described in this note is 35 minutes. This time reflects time of care of this signee Donald [name] . This critical care time does not reflect separately billable procedures or procedure time, teaching time or supervisory time of PA/NP/Med student/Med Resident etc but could involve care discussion time.       Donald [name] Pulmonary [facility] 05/24/2021 8:53 AM  Pager: see AMION  If no response to  pager , please call critical care on call (see AMION) until 7pm After 7:00 pm call Elink

## 2021-05-24 NOTE — Plan of Care (Signed)
Unable to meet goals, due to change in condition and discharge to acute services

## 2021-05-25 DIAGNOSIS — R739 Hyperglycemia, unspecified: Secondary | ICD-10-CM

## 2021-05-25 LAB — CBC
HCT: 44.1 % (ref 39.0–52.0)
Hemoglobin: 14.7 g/dL (ref 13.0–17.0)
MCH: 30.3 pg (ref 26.0–34.0)
MCHC: 33.3 g/dL (ref 30.0–36.0)
MCV: 90.9 fL (ref 80.0–100.0)
Platelets: 268 10*3/uL (ref 150–400)
RBC: 4.85 MIL/uL (ref 4.22–5.81)
RDW: 13.7 % (ref 11.5–15.5)
WBC: 20.4 10*3/uL — ABNORMAL HIGH (ref 4.0–10.5)
nRBC: 0 % (ref 0.0–0.2)

## 2021-05-25 LAB — SODIUM
Sodium: 145 mmol/L (ref 135–145)
Sodium: 151 mmol/L — ABNORMAL HIGH (ref 135–145)
Sodium: 154 mmol/L — ABNORMAL HIGH (ref 135–145)
Sodium: 156 mmol/L — ABNORMAL HIGH (ref 135–145)

## 2021-05-25 LAB — BASIC METABOLIC PANEL
Anion gap: 6 (ref 5–15)
BUN: 29 mg/dL — ABNORMAL HIGH (ref 8–23)
CO2: 23 mmol/L (ref 22–32)
Calcium: 9.2 mg/dL (ref 8.9–10.3)
Chloride: 115 mmol/L — ABNORMAL HIGH (ref 98–111)
Creatinine, Ser: 1.03 mg/dL (ref 0.61–1.24)
GFR, Estimated: >60 mL/min (ref >60)
Glucose, Bld: 200 mg/dL — ABNORMAL HIGH (ref 70–99)
Potassium: 4.4 mmol/L (ref 3.5–5.1)
Sodium: 144 mmol/L (ref 135–145)

## 2021-05-25 LAB — GLUCOSE, CAPILLARY
Glucose-Capillary: 169 mg/dL — ABNORMAL HIGH (ref 70–99)
Glucose-Capillary: 171 mg/dL — ABNORMAL HIGH (ref 70–99)
Glucose-Capillary: 172 mg/dL — ABNORMAL HIGH (ref 70–99)
Glucose-Capillary: 177 mg/dL — ABNORMAL HIGH (ref 70–99)
Glucose-Capillary: 188 mg/dL — ABNORMAL HIGH (ref 70–99)
Glucose-Capillary: 214 mg/dL — ABNORMAL HIGH (ref 70–99)

## 2021-05-25 LAB — MAGNESIUM
Magnesium: 2.6 mg/dL — ABNORMAL HIGH (ref 1.7–2.4)
Magnesium: 2.5 mg/dL — ABNORMAL HIGH (ref 1.7–2.4)

## 2021-05-25 LAB — PHOSPHORUS
Phosphorus: 2.1 mg/dL — ABNORMAL LOW (ref 2.5–4.6)
Phosphorus: 1.9 mg/dL — ABNORMAL LOW (ref 2.5–4.6)

## 2021-05-25 MED ORDER — LEVETIRACETAM 500 MG PO TABS: 1000.0000 mg | ORAL_TABLET | Freq: Two times a day (BID) | ORAL | Status: DC

## 2021-05-25 MED ORDER — ORAL CARE MOUTH RINSE
15.0000 mL | Freq: Two times a day (BID) | OROMUCOSAL | Status: DC
  Administered 2021-05-25 – 2021-06-03 (×20): 15 mL via OROMUCOSAL

## 2021-05-25 MED ORDER — SODIUM CHLORIDE 3 % IV SOLN
INTRAVENOUS | Status: DC
  Filled 2021-05-25: qty 500

## 2021-05-25 MED ORDER — LEVETIRACETAM 100 MG/ML PO SOLN
1000.0000 mg | Freq: Two times a day (BID) | ORAL | Status: DC
  Administered 2021-05-25 – 2021-06-01 (×14): 1000 mg
  Filled 2021-05-25 (×14): qty 10

## 2021-05-25 MED ORDER — CHLORHEXIDINE GLUCONATE 0.12 % MT SOLN
15.0000 mL | Freq: Two times a day (BID) | OROMUCOSAL | Status: DC
  Administered 2021-05-25 – 2021-06-03 (×17): 15 mL via OROMUCOSAL
  Filled 2021-05-25 (×13): qty 15

## 2021-05-25 MED ORDER — INSULIN ASPART 100 UNIT/ML IJ SOLN
0.0000 [IU] | INTRAMUSCULAR | Status: DC
  Administered 2021-05-25 – 2021-05-27 (×8): 2 [IU] via SUBCUTANEOUS
  Administered 2021-05-27 – 2021-05-28 (×2): 1 [IU] via SUBCUTANEOUS
  Administered 2021-05-28: 3 [IU] via SUBCUTANEOUS
  Administered 2021-05-28: 2 [IU] via SUBCUTANEOUS
  Administered 2021-05-28: 1 [IU] via SUBCUTANEOUS
  Administered 2021-05-29: 3 [IU] via SUBCUTANEOUS
  Administered 2021-05-29: 2 [IU] via SUBCUTANEOUS
  Administered 2021-05-29: 3 [IU] via SUBCUTANEOUS
  Administered 2021-05-29 – 2021-05-30 (×5): 2 [IU] via SUBCUTANEOUS
  Administered 2021-05-30 (×2): 3 [IU] via SUBCUTANEOUS
  Administered 2021-05-30 – 2021-05-31 (×2): 2 [IU] via SUBCUTANEOUS
  Administered 2021-05-31: 1 [IU] via SUBCUTANEOUS
  Administered 2021-05-31 (×2): 2 [IU] via SUBCUTANEOUS
  Administered 2021-06-01: 3 [IU] via SUBCUTANEOUS
  Administered 2021-06-01 (×3): 2 [IU] via SUBCUTANEOUS
  Administered 2021-06-01 (×2): 3 [IU] via SUBCUTANEOUS
  Administered 2021-06-02: 2 [IU] via SUBCUTANEOUS
  Administered 2021-06-02: 1 [IU] via SUBCUTANEOUS
  Administered 2021-06-02 (×2): 2 [IU] via SUBCUTANEOUS
  Administered 2021-06-02: 3 [IU] via SUBCUTANEOUS
  Administered 2021-06-02 (×2): 2 [IU] via SUBCUTANEOUS
  Administered 2021-06-03: 1 [IU] via SUBCUTANEOUS
  Administered 2021-06-03 (×3): 2 [IU] via SUBCUTANEOUS
  Administered 2021-06-04: 1 [IU] via SUBCUTANEOUS

## 2021-05-25 MED ORDER — DEXTROSE 5 % IV SOLN
15.0000 mmol | Freq: Once | INTRAVENOUS | Status: AC
  Administered 2021-05-25: 15 mmol via INTRAVENOUS
  Filled 2021-05-25: qty 5

## 2021-05-25 NOTE — Progress Notes (Addendum)
Referring Physician(s): Marvel Plan (neurology)  Supervising Physician: Julieanne Cotton  Patient Status:  [facility] - In-pt  Chief Complaint:  ICH.  Subjective:  Patient laying in bed resting comfortably. Eyes remain closed but answers (speech sluggish) to voice, follows simple commands. Oriented to place. On EEG.   Allergies: Sulfa antibiotics  Medications: Prior to Admission medications   Medication Sig Start Date End Date Taking? Authorizing Provider  amLODipine (NORVASC) 10 MG tablet Take 1 tablet (10 mg total) by mouth daily. 05/20/21   Pokhrel, Rebekah Chesterfield, MD  atorvastatin (LIPITOR) 20 MG tablet Take 1 tablet (20 mg total) by mouth daily. 05/19/21 05/19/22  Pokhrel, Rebekah Chesterfield, MD  hydrochlorothiazide (MICROZIDE) 12.5 MG capsule Take 1 capsule (12.5 mg total) by mouth daily. 05/20/21   Pokhrel, Rebekah Chesterfield, MD  Multiple Vitamins-Minerals (MENS 50+ MULTI VITAMIN/MIN PO) Take 1 Dose by mouth in the morning and at bedtime.    [provider]  pantoprazole (PROTONIX) 40 MG tablet Take 1 tablet (40 mg total) by mouth daily. 05/20/21   Pokhrel, Rebekah Chesterfield, MD  polyethylene glycol (MIRALAX / GLYCOLAX) 17 g packet Take 17 g by mouth 2 (two) times daily. 05/19/21   Pokhrel, Rebekah Chesterfield, MD  senna-docusate (SENOKOT-S) 8.6-50 MG tablet Take 1 tablet by mouth 2 (two) times daily. 05/19/21   Pokhrel, Rebekah Chesterfield, MD     Vital Signs: BP 132/80   Pulse (!) 108   Temp (!) 100.9 F (38.3 C) (Axillary)   Resp 20   Wt 146 lb 9.7 oz (66.5 kg)   SpO2 98%   BMI 24.40 kg/m   Physical Exam Constitutional:      General: He is not in acute distress.    Comments: On EEG.  Skin:    General: Skin is warm and dry.  Neurological:     Comments: Does not open eyes to voice/command. Speech slurred but answers questions appropriately. Oriented to place. Moves right side to command, no movements of left side.    Imaging: CT HEAD WO CONTRAST  Result Date: 05/23/2021 CLINICAL DATA:  Follow-up intracerebral hemorrhage  EXAM: CT HEAD WITHOUT CONTRAST TECHNIQUE: Contiguous axial images were obtained from the base of the skull through the vertex without intravenous contrast. COMPARISON:  CT 05/23/2021 FINDINGS: Brain: No significant interval change in the overall size of the intraparenchymal hemorrhagic component seen in the right frontal with some extensive surrounding hypo attenuating edematous changes. Locoregional mass effect with sulcal effacement and effacement of the right lateral ventricle as well as stable subfalcine herniation with a right to left midline shift of approximately 14 mm. Slight medialization of the right uncus without clear transtentorial herniation is also unchanged from prior. Basal cisterns remain patent. No new sites of hemorrhage. No new loss of gray-white differentiation. Vascular: No hyperdense vessel or unexpected calcification. Skull: Normal. Negative for fracture or focal lesion. EEG leads in place. Sinuses/Orbits: Mild mural thickening in the maxillary sinuses. Paranasal sinuses and mastoid air cells are otherwise predominantly clear. Included orbital structures are unremarkable. Other: None. IMPRESSION: 1. Stable appearance of the right frontal lobe intraparenchymal hemorrhage with surrounding cerebral edema, local mass effect as well as subfalcine midline shift of approximately 14 mm in slight medial ization of the right uncus, not significantly changed from comparison imaging. 2. No new sites of hemorrhage or new gray-white differentiation loss. Electronically Signed   By: Kreg Shropshire M.D.   On: 05/23/2021 23:58   CT HEAD WO CONTRAST  Result Date: 05/23/2021 CLINICAL DATA:  Follow-up right frontal parenchymal hemorrhage seen  earlier today. EXAM: CT HEAD WITHOUT CONTRAST TECHNIQUE: Contiguous axial images were obtained from the base of the skull through the vertex without intravenous contrast. COMPARISON:  Earlier today. FINDINGS: Brain: No significant change in the previously described high  right frontal lobe parenchymal hematoma with extensive surrounding edema and midline shift to the left. The midline shift currently measures 10 mm on image number 16/3. No interval ventricular enlargement. No new hemorrhage. Vascular: No hyperdense vessel or unexpected calcification. Skull: Normal. Negative for fracture or focal lesion. Sinuses/Orbits: Stable small left frontal sinus osteoma. Mild moderate left maxillary sinus mucosal thickening without significant change. Unremarkable orbits. Other: Bilateral concha bullosa. IMPRESSION: 1. Stable large high right frontal parenchymal hematoma with extensive associated edema and midline shift to the left. 2. Stable left maxillary chronic sinusitis. Electronically Signed   By: Beckie Salts M.D.   On: 05/23/2021 18:00   CT HEAD WO CONTRAST  Result Date: 05/23/2021 CLINICAL DATA:  Cerebral hemorrhage, decreased responsiveness EXAM: CT HEAD WITHOUT CONTRAST TECHNIQUE: Contiguous axial images were obtained from the base of the skull through the vertex without intravenous contrast. COMPARISON:  Earlier same day FINDINGS: Brain: Parenchymal hematoma centered within the high right frontal lobe does not measure substantially changed from the prior study. Extensive surrounding edema is again noted with regional mass effect. Leftward midline shift measures similar at 14 mm at the level of the septum pellucidum. Ventricle caliber is unchanged with no new trapping. No significant central herniation. There is no new loss of gray-white differentiation. Intraventricular hemorrhage is identified. Vascular: No new findings. Skull: Unremarkable. Sinuses/Orbits: No acute finding. Other: None. IMPRESSION: No substantial change in right frontal parenchymal hemorrhage, edema, and mass effect including midline shift. No new ventricle trapping or acute infarction. Electronically Signed   By: Guadlupe Spanish M.D.   On: 05/23/2021 12:49   DG CHEST PORT 1 VIEW  Result Date:  05/24/2021 CLINICAL DATA:  Leukocytosis EXAM: PORTABLE CHEST 1 VIEW COMPARISON:  04/28/15 FINDINGS: Cardiac shadow is within normal limits. Mild aortic calcifications are noted. The lungs are well aerated bilaterally with minimal left basilar atelectasis. No bony abnormality is seen. IMPRESSION: Minimal left basilar atelectasis. Electronically Signed   By: Alcide Clever M.D.   On: 05/24/2021 10:52   DG Abd Portable 1V  Result Date: 05/24/2021 CLINICAL DATA:  Feeding tube placement EXAM: PORTABLE ABDOMEN - 1 VIEW COMPARISON:  05/20/2021 FINDINGS: Limited radiograph of the lower chest and upper abdomen was obtained for the purposes of enteric tube localization. Enteric tube is seen coursing below the diaphragm with distal tip terminating within the expected location of the gastric body. Air-filled large and small bowel loops throughout the abdomen, similar to prior. IMPRESSION: Enteric tube tip terminates within the gastric body. Electronically Signed   By: Duanne Guess D.O.   On: 05/24/2021 13:50   EEG adult  Result Date: 05/23/2021 Charlsie Quest, MD     05/23/2021  4:11 PM Patient Name: Donald [name] MRN: [id number] Epilepsy Attending: Charlsie Quest Referring Physician/Provider: Dr Bing Neighbors Date: 05/23/2021 Duration: 25.36 mins Patient history:  65 y.o. male currently admitted for ICH on 05/11/21 developed somnolence and aphasia with R gaze deviation that resolved with ativan. EEG to evaluate for seizure Level of alertness: asleep AEDs during EEG study: LEV Technical aspects: This EEG study was done with scalp electrodes positioned according to the 10-20 International system of electrode placement. Electrical activity was acquired at a sampling rate of 500Hz  and reviewed with a high frequency filter of   and a low frequency filter of . EEG data were recorded continuously and digitally stored. Description: No posterior dominant rhythm was seen. Sleep was characterized by sleep spindles (12  to 14 Hz), maximal frontocentral region. EEG showed continuous generalized 3 to 6 Hz theta-delta slowing. There is an excessive amount of 15 to 18 Hz, 2-3 uV beta activity distributed symmetrically and diffusely.  Hyperventilation and photic stimulation were not performed.   ABNORMALITY - Continuous slow, generalized - Excessive beta, generalized IMPRESSION: This study is suggestive of moderate to severe diffuse encephalopathy, nonspecific etiology. The excessive beta activity seen in the background is most likely due to the effect of benzodiazepine and is a benign EEG pattern. No seizures or epileptiform discharges were seen throughout the recording. Priyanka Annabelle Harman   Overnight EEG with video  Result Date: 05/24/2021 Charlsie Quest, MD     05/25/2021  9:42 AM Patient Name: Donald [name] MRN: [id number] Epilepsy Attending: Charlsie Quest Referring Physician/Provider: Dr Bing Neighbors Duration: 05/23/2021 1655 to 05/24/2021 1655  Patient history:  65 y.o. male currently admitted for ICH on 05/11/21 developed somnolence and aphasia with R gaze deviation that resolved with ativan. EEG to evaluate for seizure  Level of alertness: awake, asleep  AEDs during EEG study: LEV  Technical aspects: This EEG study was done with scalp electrodes positioned according to the 10-20 International system of electrode placement. Electrical activity was acquired at a sampling rate of  and reviewed with a high frequency filter of  and a low frequency filter of . EEG data were recorded continuously and digitally stored.  Description: No posterior dominant rhythm was seen. Sleep was characterized by sleep spindles (12 to 14 Hz), asymmetry ( R<L) maximal frontocentral region. EEG showed continuous generalized and lateralized right hemisphere 3 to 6 Hz theta-delta slowing. Hyperventilation and photic stimulation were not performed.    ABNORMALITY - Continuous slow, generalized and lateralized right hemisphere - Spindle  asymmetry, right<left  IMPRESSION: This study is suggestive of cortical dysfunction in right hemisphere likely secondary to underlying structural abnormality.  Additionally there is moderate to severe diffuse encephalopathy, nonspecific etiology. No seizures or definite epileptiform discharges were seen throughout the recording.   Charlsie Quest   CT HEAD CODE STROKE WO CONTRAST  Result Date: 05/23/2021 CLINICAL DATA:  Code stroke.  Acute stroke. EXAM: CT HEAD WITHOUT CONTRAST TECHNIQUE: Contiguous axial images were obtained from the base of the skull through the vertex without intravenous contrast. COMPARISON:  Head CT from 05/11/2021 FINDINGS: Brain: 5.5 x 2.8 cm hematoma in the subcortical high right frontal region with extensive vasogenic edema in the adjacent brain that is progressed. There is worsening local mass effect with midline shift now measuring 13 mm. No entrapment. No cytotoxic edema seen. Vascular: No hyperdense vessel or unexpected calcification. Skull: Normal. Negative for fracture or focal lesion. Sinuses/Orbits: Negative Other: Critical Value/emergent results were called by telephone at the time of interpretation on 05/23/2021 at 9:10 am to provider [facility] , who verbally acknowledged these results. ASPECTS [facility] Stroke Program Early CT Score) Not scored in this setting IMPRESSION: Progressed right frontal hematoma and vasogenic edema with midline shift now measuring 13 mm. Electronically Signed   By: Marnee Spring M.D.   On: 05/23/2021 09:12   CT ANGIO HEAD NECK W WO CM (CODE STROKE)  Result Date: 05/23/2021 CLINICAL DATA:  Enlarging hematoma EXAM: CT ANGIOGRAPHY HEAD AND NECK TECHNIQUE: Multidetector CT imaging of the head and neck was performed using the standard  protocol during bolus administration of intravenous contrast. Multiplanar CT image reconstructions and MIPs were obtained to evaluate the vascular anatomy. Carotid stenosis measurements (when applicable) are  obtained utilizing NASCET criteria, using the distal internal carotid diameter as the denominator. CONTRAST:  16mL OMNIPAQUE IOHEXOL 350 MG/ML SOLN COMPARISON:  CTA 05/11/2021 FINDINGS: CTA NECK FINDINGS Aortic arch: Normal Right carotid system: Widely patent vessels with smooth contour. No atheromatous changes Left carotid system: Widely patent vessels with smooth contour. No noted atheromatous changes Vertebral arteries: No proximal subclavian stenosis. The vertebral arteries are widely patent. No suspected vertebral beading when allowing for streak artifact. Skeleton: Multilevel degenerative disc narrowing and ridging canal and foraminal encroachment the lower cervical levels. Other neck: No evidence of inflammation or mass. Upper chest: Negative Review of the MIP images confirms the above findings CTA HEAD FINDINGS Anterior circulation: Major vessels are smooth and widely patent. No branch occlusion, generalized beading, or aneurysm. There is a cortical branch at the right frontal parietal convexity which is not continuous beyond the level of the upper and lateral hematoma. No visible nidus or early enhancing veins. Posterior circulation: Vertebral and basilar arteries are smooth and widely patent. No branch occlusion, beading, or aneurysm. Venous sinuses: MRV was obtained previously. The veins are largely nonenhancing on this study. Anatomic variants: None significant Review of the MIP images confirms the above findings IMPRESSION: Seemingly truncated arterial cortical branch at the upper and lateral hematoma, without discrete vascular malformation or spot sign. Recommend follow-up after resolution of mass effect to evaluate for small AVM. Electronically Signed   By: Marnee Spring M.D.   On: 05/23/2021 09:54    Labs:  CBC: Recent Labs    05/18/21 0446 05/21/21 0532 05/24/21 0134 05/25/21 0141  WBC 6.6 8.7 22.9* 20.4*  HGB 17.0 16.2 16.3 14.7  HCT 50.1 47.5 49.6 44.1  PLT 257 292 309 268     COAGS: Recent Labs    05/11/21 0851  INR 1.0  APTT 26    BMP: Recent Labs    09/11/20 1449 05/11/21 0851 05/18/21 0446 05/21/21 0532 05/24/21 0134 05/24/21 0838 05/24/21 1604 05/24/21 1929 05/25/21 0141 05/25/21 0743  NA 143   < > 134* 134* 135   < > 141 140 144  145 151*  K 4.3   < > 4.6 4.3 4.4  --   --   --  4.4  --   CL 103   < > 95* 95* 96*  --   --   --  115*  --   CO2 26   < > 28 29 27   --   --   --  23  --   GLUCOSE 96   < > 116* 133* 135*  --   --   --  200*  --   BUN 10   < > 22 20 27*  --   --   --  29*  --   CALCIUM 10.1   < > 10.1 10.0 10.1  --   --   --  9.2  --   CREATININE 1.17   < > 1.23 1.20 1.45*  --   --   --  1.03  --   GFRNONAA 66   < > >60 >60 54*  --   --   --  >60  --   GFRAA 76  --   --   --   --   --   --   --   --   --    < > =  values in this interval not displayed.    LIVER FUNCTION TESTS: Recent Labs    09/11/20 1449 05/11/21 0851 05/12/21 0341 05/21/21 0532  BILITOT 0.5 0.7 0.8 0.9  AST 24 23 24 27   ALT 23 17 19 25   ALKPHOS 124* 94 108 100  PROT 7.6 6.7 7.5 7.7  ALBUMIN 4.8 3.9 4.3 4.0    Assessment and Plan:  ICH. Dr. Corliss Skainseveshwar and Dr. Denese KillingsAgarwala at bedside. Patient's mentation has improved since yesterday (speech slurred but answers questions and follows simple commands, oriented to place). Plan to monitor mentation over the next few days, if continues to improve plan for image-guided diagnostic cerebral arteriogram in IR with Dr. Corliss Skainseveshwar tentatively for Thursday 05/27/2021 pending IR scheduling. Patient will be NPO at midnight prior to this. Further plans per  NIR to follow.   Electronically Signed: Elwin MochaAlexandra Jayle Solarz, PA-C 05/25/2021, 12:51 PM   I spent a total of 15 Minutes at the the patient's bedside AND on the patient's hospital floor or unit, greater than 50% of which was counseling/coordinating care for ICH.

## 2021-05-25 NOTE — Progress Notes (Signed)
LTM EEG discontinued - no skin breakdown at unhook.   

## 2021-05-25 NOTE — Progress Notes (Signed)
NAME:  Yago Ludvigsen, MRN:  [id number], DOB:  05-Aug-1956, LOS: 2 ADMISSION DATE:  05/23/2021, CONSULTATION DATE:  05/25/2021 REFERRING MD:  Admitted from Rehab, CHIEF COMPLAINT:  altered mental status   History of Present Illness:  Mr. Weintraub is a 65 year old gentleman with recent intracranial hemorrhage who presents from rehab with worsening mental status and seizures.  He initially presented May 31 with new onset left-sided hemiparesis, right gaze, and left mild facial droop.  He was found to have acute intracranial hemorrhage of the posterior right frontal lobe.  He was medically managed with blood pressure control and discharged to inpatient rehab.  He had just finished his first day of intensive rehab.  At rehab he was sitting up, eating with assistance, able to talk.  This morning he was being fed breakfast with assistance and his nursing tech noted difficulty swallowing, facial droop, decreased mentation.  Code stroke was called.  CT head shows enlargement of his existing right intraparenchymal hemorrhage, with worsening midline shift.    He remains in critical condition in the 4N ICU.  Pertinent  Medical History  Hypertension Mild cognitive decline Intracranial hemorrhage  Significant Hospital Events: Including procedures, antibiotic start and stop dates in addition to other pertinent events   6/12 admitted from inpatient rehab for acute worsening of intracranial hemorrhage of the right frontal lobe 6/13 Hypertonic started. NIR cancelled due to neurostatus   Interim History / Subjective:  Tmax 102.6  uop, +962 yesterday, +472 admit  Hypertonic increased from 52ml hr to 78ml hr overnight  Unable to obtain subjective evaluation due to patient status  Objective   Blood pressure (!) 130/98, pulse (!) 106, temperature 98.2 F (36.8 C), temperature source Axillary, resp. rate (!) 21, weight 66.5 kg, SpO2 97 %. On RA        Intake/Output Summary (Last 24 hours) at  05/25/2021 0737 Last data filed at 05/25/2021 0630 Gross per 24 hour  Intake 2002.69 ml  Output 1340 ml  Net 662.69 ml   Filed Weights   05/24/21 0500 05/25/21 0500  Weight: 66.5 kg 66.5 kg    Examination: General:  in bed, no acute distress, appears comfortable HEENT: MM pink/moist, anicteric, atraumatic Neuro: Follow commands with right extremities, confused, no eye opening,  PERRL 3 mm CV: S1S2, nsr on monitor, no m/r/g appreciated PULM:  clear in the upper lobes and in the lower lobes, trachea midline, chest expansion symmetric GI: soft, bsx4 active/hypoactive, non distended   Extremities: warm/dry, no pretibial edema, capillary refill less than 3 seconds  Skin: no rashes or lesions   Labs/imaging that I havepersonally reviewed  (right click and "Reselect all SmartList Selections" daily)  CBC- leukocytosis decreasing BNP- NA 144, Creat 1.45>1.03 KUB- DHT gastric CXR- mild left atelectasis Resolved Hospital Problem list     Assessment & Plan:   Mr. Winegar is 65 year old gentleman with recent intracranial hemorrhage now with worsening mental status changes and rebleeding of his right frontal lobe hemorrhage  Intracranial hemorrhage midline shift of the right frontal lobe Cerebral Edema ?Seizure As seen on 6/12 CT head, edema on CT head. No seizures seen on EEG. -Continue 3% NS at 65. Goal NA 145-150. Continue q6h NA monitorign -Stopping keppra. No seizures seen. Discussed with Dr. Denese Killings and Dr. Melynda Ripple -Discussed with Dr. Melynda Ripple regarding stopping EEG since no seizures have been seen. -Will discuss with Dr. Roda Shutters regarding timing for re-imaging head -SBP goal 110- 140. Cleviprex ordered for HTN. Titrate to goal -Continue neuroprotective measures- normothermia,  euglycemia, HOB greater than 30, head in neutral alignment, normocapnia, normoxia.  -NIR to complete angiogram on Thursday if patient is neurologically stable to evaluate for AVM.  Dysphagia S/p ICH -Appreciate  SLP assistance. -Continue DHT  Leukocytosis Tmax 102.6, WBC 22.9>20.4, suspect secondary to intracranial bleed. CXR not concerning for infiltrate. Leukocytosis downtrending. Neurologic exam improving. -Continue to trend wbc/fever curve. -Follow up UA/UC  HLD LDL 140 -Management per Neuro. Goal LDL <70. Start low dose statin on discharge.  Hyperglycemia BG 144 to 250 -Starting SSI  Dementia Was in process of workup for early onset dementia at [facility] -Follow up with [facility] post d/c.  Best practice (right click and "Reselect all SmartList Selections" daily)  Diet:  Tube Feed  Pain/Anxiety/Delirium protocol (if indicated): No VAP protocol (if indicated): Not indicated DVT prophylaxis: SCD GI prophylaxis: N/A Glucose control:  SSI Yes Central venous access:  N/A Arterial line:  N/A Foley:  N/A Mobility:  bed rest  PT consulted: Yes Last date of multidisciplinary goals of care discussion [5/13 at bedside with wife] Code Status:  full code Disposition: ICU   Critical care time: 30 minutes        Flora Lipps Pulmonary and Critical Care Medicine 05/25/2021 7:37 AM  Pager: see AMION  If no response to pager , please call critical care on call (see AMION) until 7pm After 7:00 pm call Elink

## 2021-05-25 NOTE — Progress Notes (Signed)
Inpatient Diabetes Program Recommendations  AACE/ADA: New Consensus Statement on Inpatient Glycemic Control (2015)  Target Ranges:  Prepandial:   less than 140 mg/dL      Peak postprandial:   less than 180 mg/dL (1-2 hours)      Critically ill patients:  140 - 180 mg/dL   Lab Results  Component Value Date   GLUCAP 177 (H) 05/25/2021   HGBA1C 5.8 (H) 05/11/2021    Review of Glycemic Control Results for Donald, [name] (MRN [id number]) as of 05/25/2021 15:31  Ref. Range 05/25/2021 03:57 05/25/2021 07:51 05/25/2021 11:43  Glucose-Capillary Latest Ref Range: 70 - 99 mg/dL 500 (H) 370 (H) 488 (H)   Diabetes history: None Current orders for Inpatient glycemic control:  Novolog sensitive q 4 hours Inpatient Diabetes Program Recommendations:    If blood sugars remain >200 mg/dL, consider adding Levemir 5 units bid.   Thanks,  Beryl Meager, RN, BC-ADM Inpatient Diabetes Coordinator Pager [phone]  (8a-5p)

## 2021-05-25 NOTE — Progress Notes (Signed)
Phos 2.1 Replaced per protocol  

## 2021-05-25 NOTE — Progress Notes (Signed)
STROKE TEAM PROGRESS NOTE   SUBJECTIVE (INTERVAL HISTORY) His RN is at the bedside.  Patient lying in bed, still has eyes closed but more awake and responsive than yesterday. Able to name and repeat and follow questions on the right. Still has left hemiplegia. Na 144->151, will decrease 3% infusion rate. Angiogram scheduled at Thursday.   OBJECTIVE Temp:  [98.2 F (36.8 C)-102.6 F (39.2 C)] 100.9 F (38.3 C) (06/14 1200) Pulse Rate:  [89-122] 108 (06/14 1200) Cardiac Rhythm: Normal sinus rhythm (06/14 0800) Resp:  [16-29] 20 (06/14 1200) BP: (96-150)/(63-98) 132/80 (06/14 1200) SpO2:  [95 %-99 %] 98 % (06/14 1200) Weight:  [66.5 kg] 66.5 kg (06/14 0500)  Recent Labs  Lab 05/24/21 2007 05/24/21 2306 05/25/21 0357 05/25/21 0751 05/25/21 1143  GLUCAP 188* 250* 188* 214* 177*   Recent Labs  Lab 05/21/21 0532 05/24/21 0134 05/24/21 0838 05/24/21 1604 05/24/21 1929 05/25/21 0141 05/25/21 0743  NA 134* 135 136 141 140 144  145 151*  K 4.3 4.4  --   --   --  4.4  --   CL 95* 96*  --   --   --  115*  --   CO2 29 27  --   --   --  23  --   GLUCOSE 133* 135*  --   --   --  200*  --   BUN 20 27*  --   --   --  29*  --   CREATININE 1.20 1.45*  --   --   --  1.03  --   CALCIUM 10.0 10.1  --   --   --  9.2  --   MG  --  2.5*  --  2.5*  --  2.6*  --   PHOS  --  3.6  --  3.2  --  2.1*  --    Recent Labs  Lab 05/21/21 0532  AST 27  ALT 25  ALKPHOS 100  BILITOT 0.9  PROT 7.7  ALBUMIN 4.0   Recent Labs  Lab 05/21/21 0532 05/24/21 0134 05/25/21 0141  WBC 8.7 22.9* 20.4*  NEUTROABS 5.4  --   --   HGB 16.2 16.3 14.7  HCT 47.5 49.6 44.1  MCV 88.3 90.0 90.9  PLT 292 309 268   No results for input(s): CKTOTAL, CKMB, CKMBINDEX, TROPONINI in the last 168 hours. No results for input(s): LABPROT, INR in the last 72 hours. Recent Labs    05/24/21 2224  COLORURINE YELLOW  LABSPEC 1.023  PHURINE 6.0  GLUCOSEU 50*  HGBUR MODERATE*  BILIRUBINUR NEGATIVE  KETONESUR  NEGATIVE  PROTEINUR 30*  NITRITE POSITIVE*  LEUKOCYTESUR NEGATIVE       Component Value Date/Time   CHOL 232 (H) 05/11/2021 1118   CHOL 204 (H) 09/11/2020 1449   TRIG 73 05/11/2021 1118   HDL 77 05/11/2021 1118   HDL 63 09/11/2020 1449   CHOLHDL 3.0 05/11/2021 1118   VLDL 15 05/11/2021 1118   LDLCALC 140 (H) 05/11/2021 1118   LDLCALC 132 (H) 09/11/2020 1449   LDLCALC 107 (H) 04/29/2020 0826   Lab Results  Component Value Date   HGBA1C 5.8 (H) 05/11/2021      Component Value Date/Time   LABOPIA NONE DETECTED 05/11/2021 1109   COCAINSCRNUR NONE DETECTED 05/11/2021 1109   LABBENZ NONE DETECTED 05/11/2021 1109   AMPHETMU NONE DETECTED 05/11/2021 1109   THCU NONE DETECTED 05/11/2021 1109   LABBARB NONE DETECTED 05/11/2021 1109  No results for input(s): ETH in the last 168 hours.   I have personally reviewed the radiological images below and agree with the radiology interpretations.  DG Abd 1 View  Result Date: 05/20/2021 CLINICAL DATA:  Abdominal pain. EXAM: ABDOMEN - 1 VIEW COMPARISON:  None. FINDINGS: Large amount of intestinal gas throughout small and large bowel suggesting ileus. There does not appear to be a large amount of fecal matter. No focal obstruction is evident. No free air. No abnormal calcifications or significant bone findings. IMPRESSION: Large amount of intestinal gas suggesting ileus. Electronically Signed   By: Paulina Fusi M.D.   On: 05/20/2021 19:25   CT HEAD WO CONTRAST  Result Date: 05/23/2021 CLINICAL DATA:  Follow-up intracerebral hemorrhage EXAM: CT HEAD WITHOUT CONTRAST TECHNIQUE: Contiguous axial images were obtained from the base of the skull through the vertex without intravenous contrast. COMPARISON:  CT 05/23/2021 FINDINGS: Brain: No significant interval change in the overall size of the intraparenchymal hemorrhagic component seen in the right frontal with some extensive surrounding hypo attenuating edematous changes. Locoregional mass effect  with sulcal effacement and effacement of the right lateral ventricle as well as stable subfalcine herniation with a right to left midline shift of approximately 14 mm. Slight medialization of the right uncus without clear transtentorial herniation is also unchanged from prior. Basal cisterns remain patent. No new sites of hemorrhage. No new loss of gray-white differentiation. Vascular: No hyperdense vessel or unexpected calcification. Skull: Normal. Negative for fracture or focal lesion. EEG leads in place. Sinuses/Orbits: Mild mural thickening in the maxillary sinuses. Paranasal sinuses and mastoid air cells are otherwise predominantly clear. Included orbital structures are unremarkable. Other: None. IMPRESSION: 1. Stable appearance of the right frontal lobe intraparenchymal hemorrhage with surrounding cerebral edema, local mass effect as well as subfalcine midline shift of approximately 14 mm in slight medial ization of the right uncus, not significantly changed from comparison imaging. 2. No new sites of hemorrhage or new gray-white differentiation loss. Electronically Signed   By: Kreg Shropshire M.D.   On: 05/23/2021 23:58   CT HEAD WO CONTRAST  Result Date: 05/23/2021 CLINICAL DATA:  Follow-up right frontal parenchymal hemorrhage seen earlier today. EXAM: CT HEAD WITHOUT CONTRAST TECHNIQUE: Contiguous axial images were obtained from the base of the skull through the vertex without intravenous contrast. COMPARISON:  Earlier today. FINDINGS: Brain: No significant change in the previously described high right frontal lobe parenchymal hematoma with extensive surrounding edema and midline shift to the left. The midline shift currently measures 10 mm on image number 16/3. No interval ventricular enlargement. No new hemorrhage. Vascular: No hyperdense vessel or unexpected calcification. Skull: Normal. Negative for fracture or focal lesion. Sinuses/Orbits: Stable small left frontal sinus osteoma. Mild moderate left  maxillary sinus mucosal thickening without significant change. Unremarkable orbits. Other: Bilateral concha bullosa. IMPRESSION: 1. Stable large high right frontal parenchymal hematoma with extensive associated edema and midline shift to the left. 2. Stable left maxillary chronic sinusitis. Electronically Signed   By: Beckie Salts M.D.   On: 05/23/2021 18:00   CT HEAD WO CONTRAST  Result Date: 05/23/2021 CLINICAL DATA:  Cerebral hemorrhage, decreased responsiveness EXAM: CT HEAD WITHOUT CONTRAST TECHNIQUE: Contiguous axial images were obtained from the base of the skull through the vertex without intravenous contrast. COMPARISON:  Earlier same day FINDINGS: Brain: Parenchymal hematoma centered within the high right frontal lobe does not measure substantially changed from the prior study. Extensive surrounding edema is again noted with regional mass effect. Leftward midline shift  measures similar at 14 mm at the level of the septum pellucidum. Ventricle caliber is unchanged with no new trapping. No significant central herniation. There is no new loss of gray-white differentiation. Intraventricular hemorrhage is identified. Vascular: No new findings. Skull: Unremarkable. Sinuses/Orbits: No acute finding. Other: None. IMPRESSION: No substantial change in right frontal parenchymal hemorrhage, edema, and mass effect including midline shift. No new ventricle trapping or acute infarction. Electronically Signed   By: Guadlupe Spanish M.D.   On: 05/23/2021 12:49   CT HEAD WO CONTRAST  Result Date: 05/11/2021 CLINICAL DATA:  Stroke.  Intracranial hemorrhage follow-up. EXAM: CT HEAD WITHOUT CONTRAST TECHNIQUE: Contiguous axial images were obtained from the base of the skull through the vertex without intravenous contrast. COMPARISON:  Same day head CT. FINDINGS: Brain: Mild interval increase in the size of the right frontal intraparenchymal hemorrhage, measuring 4.5 x 2.6 x 3.5 cm (previously 4.2 x 2.3 x 3.1 cm). Similar  adjacent small volume subarachnoid hemorrhage. A small amount of subdural hemorrhage in this region is also not excluded. Surrounding edema is similar. Similar local mass effect without midline shift. Basal cisterns are patent. No evidence of acute large vascular territory infarct elsewhere. Similar mild white matter hypodensities, likely chronic microvascular ischemic disease. No hydrocephalus. Vascular: No hyperdense vessel.  Calcific atherosclerosis. Skull: No acute fracture. Sinuses/Orbits: Small left frontal sinus osteoma. Mild right and moderate left maxillary sinus mucosal thickening. Unremarkable orbits. Other: No mastoid effusions. IMPRESSION: Mild interval increase in the size of the right frontal intraparenchymal hemorrhage, measuring 4.5 x 2.6 x 3.5 cm (previously 4.2 x 2.3 x 3.1 cm). Similar adjacent small volume hemorrhage. Electronically Signed   By: Feliberto Harts MD   On: 05/11/2021 17:05   MR BRAIN WO CONTRAST  Result Date: 05/12/2021 CLINICAL DATA:  65 year old male code stroke presentation with intra-axial right hemisphere hemorrhage. Hypertensive (194/104) on presentation. EXAM: MRI HEAD WITHOUT CONTRAST TECHNIQUE: Multiplanar, multiecho pulse sequences of the brain and surrounding structures were obtained without intravenous contrast. COMPARISON:  CT head CTA head and neck 05/11/2021 FINDINGS: Brain: Coronal T2 weighted imaging could not be obtained. And some of the exam is intermittently degraded by motion artifact despite repeated imaging attempts. T1 isointense intra-axial hemorrhage in the posterior right frontal lobe demonstrates a layering hematocrit level (series 13, image 19) and blood encompasses 43 by 38 x 33 mm (AP by transverse by CC) for an estimated volume of 26 mL. Regional edema. Mild regional mass effect. Trace subarachnoid hemorrhage also suspected on axial FLAIR imaging. Additionally, on SWI there is also chronic hemosiderin in the right parietal lobe on series 19 image  34. But no other definite chronic blood products. Susceptibility related abnormal diffusion in the posterior right frontal lobe with no convincing larger area of restricted diffusion. No restricted diffusion elsewhere. No IVH or ventriculomegaly. Normal basilar cisterns. Negative pituitary and cervicomedullary junction. Wallace Cullens and white matter signal outside of the affected right hemisphere is largely normal for age with mild nonspecific white matter changes. Vascular: Major intracranial vascular flow voids are preserved. Skull and upper cervical spine: Visualized bone marrow signal is within normal limits. Grossly negative cervical spine. Sinuses/Orbits: Negative orbits. Mild to moderate maxillary sinus mucosal thickening. Other: Mastoids are clear. Grossly normal visible internal auditory structures. IMPRESSION: 1. Posterior right frontal lobe intra-axial hemorrhage with layering hematocrit level has not significantly changed (estimated blood volume of 26 mL). Surrounding edema and mild regional mass effect. Trace subarachnoid hemorrhage. 2. No larger underlying infarct is evident. But chronic hemosiderin  in the right parietal lobe indicates a previous intra-axial hemorrhage in the region. 3. No other acute intracranial abnormality. Electronically Signed   By: Odessa Fleming M.D.   On: 05/12/2021 07:26   DG CHEST PORT 1 VIEW  Result Date: 05/24/2021 CLINICAL DATA:  Leukocytosis EXAM: PORTABLE CHEST 1 VIEW COMPARISON:  04/28/15 FINDINGS: Cardiac shadow is within normal limits. Mild aortic calcifications are noted. The lungs are well aerated bilaterally with minimal left basilar atelectasis. No bony abnormality is seen. IMPRESSION: Minimal left basilar atelectasis. Electronically Signed   By: Alcide Clever M.D.   On: 05/24/2021 10:52   DG Abd Portable 1V  Result Date: 05/24/2021 CLINICAL DATA:  Feeding tube placement EXAM: PORTABLE ABDOMEN - 1 VIEW COMPARISON:  05/20/2021 FINDINGS: Limited radiograph of the lower chest  and upper abdomen was obtained for the purposes of enteric tube localization. Enteric tube is seen coursing below the diaphragm with distal tip terminating within the expected location of the gastric body. Air-filled large and small bowel loops throughout the abdomen, similar to prior. IMPRESSION: Enteric tube tip terminates within the gastric body. Electronically Signed   By: Duanne Guess D.O.   On: 05/24/2021 13:50   EEG adult  Result Date: 05/23/2021 Donald Quest, MD     05/23/2021  4:11 PM Patient Name: Donald [name] MRN: [id number] Epilepsy Attending: Charlsie [name] Referring Physician/Provider: Dr Bing Neighbors Date: 05/23/2021 Duration: 25.36 mins Patient history:  65 y.o. male currently admitted for ICH on 05/11/21 developed somnolence and aphasia with R gaze deviation that resolved with ativan. EEG to evaluate for seizure Level of alertness: asleep AEDs during EEG study: LEV Technical aspects: This EEG study was done with scalp electrodes positioned according to the 10-20 International system of electrode placement. Electrical activity was acquired at a sampling rate of 500Hz  and reviewed with a high frequency filter of 70Hz  and a low frequency filter of 1Hz . EEG data were recorded continuously and digitally stored. Description: No posterior dominant rhythm was seen. Sleep was characterized by sleep spindles (12 to 14 Hz), maximal frontocentral region. EEG showed continuous generalized 3 to 6 Hz theta-delta slowing. There is an excessive amount of 15 to 18 Hz, 2-3 uV beta activity distributed symmetrically and diffusely.  Hyperventilation and photic stimulation were not performed.   ABNORMALITY - Continuous slow, generalized - Excessive beta, generalized IMPRESSION: This study is suggestive of moderate to severe diffuse encephalopathy, nonspecific etiology. The excessive beta activity seen in the background is most likely due to the effect of benzodiazepine and is a benign EEG pattern. No  seizures or epileptiform discharges were seen throughout the recording. Donald   Overnight EEG with video  Result Date: 05/24/2021 , MD     05/25/2021  9:42 AM Patient Name: Donald [name] MRN: Donald [name] Epilepsy Attending: 05/27/2021 Referring Physician/Provider: Dr Melanee Left Duration: 05/23/2021 1655 to 05/24/2021 1655  Patient history:  65 y.o. male currently admitted for ICH on 05/11/21 developed somnolence and aphasia with R gaze deviation that resolved with ativan. EEG to evaluate for seizure  Level of alertness: awake, asleep  AEDs during EEG study: LEV  Technical aspects: This EEG study was done with scalp electrodes positioned according to the 10-20 International system of electrode placement. Electrical activity was acquired at a sampling rate of 500Hz  and reviewed with a high frequency filter of 70Hz  and a low frequency filter of 1Hz . EEG data were recorded continuously and digitally stored.  Description: No posterior dominant rhythm  was seen. Sleep was characterized by sleep spindles (12 to 14 Hz), asymmetry ( R<L) maximal frontocentral region. EEG showed continuous generalized and lateralized right hemisphere 3 to 6 Hz theta-delta slowing. Hyperventilation and photic stimulation were not performed.    ABNORMALITY - Continuous slow, generalized and lateralized right hemisphere - Spindle asymmetry, right<left  IMPRESSION: This study is suggestive of cortical dysfunction in right hemisphere likely secondary to underlying structural abnormality.  Additionally there is moderate to severe diffuse encephalopathy, nonspecific etiology. No seizures or definite epileptiform discharges were seen throughout the recording.   Donald Questriyanka O [name]   MR MRV HEAD W WO CONTRAST  Result Date: 05/12/2021 CLINICAL DATA:  Intracranial hemorrhage. EXAM: MR VENOGRAM HEAD WITHOUT AND WITH CONTRAST TECHNIQUE: Angiographic images of the intracranial venous structures were acquired using MRV  technique without and with intravenous contrast. CONTRAST:  7mL GADAVIST GADOBUTROL 1 MMOL/ML IV SOLN COMPARISON:  Brain MRI performed earlier today 05/12/2021. Noncontrast head CT examinations 05/11/2021. CT angiogram head/neck 05/11/2021. FINDINGS: The superior sagittal sinus, internal cerebral veins, vein of Galen, straight sinus, transverse sinuses, sigmoid sinuses and visualized jugular veins are patent. No appreciable intracranial venous thrombosis. Hypoplastic right transverse and sigmoid dural venous sinuses. An acute parenchymal hemorrhage within the posterior right frontal lobe is grossly unchanged in size as compared to the brain MRI performed earlier today. There is mild curvilinear vascular enhancement overlying the site of hemorrhage, which may be reactive. Elsewhere, no abnormal intracranial enhancement is identified. IMPRESSION: No evidence of intracranial venous thrombosis. Non dominant right transverse and sigmoid dural venous sinuses. Electronically Signed   By: Jackey LogeKyle  Golden DO   On: 05/12/2021 11:27   ECHOCARDIOGRAM COMPLETE  Result Date: 05/11/2021    ECHOCARDIOGRAM REPORT   [name] Date of Exam: 05/11/2021 Medical Rec #:  [id number]        Height:       68.0 in Accession #:    [id number]       Weight:       155.2 lb Date of Birth:  11/21/1956         BSA:          1.835 m Patient Age:    65 years         BP:           115/77 mmHg Patient Gender: M                HR:           84 bpm. Exam Location:  Inpatient Procedure: 2D Echo, Cardiac Doppler and Color Doppler Indications:    Stroke I63.9  History:        Patient has no prior history of Echocardiogram examinations.  Sonographer:    Roosvelt Maserachel Lane RDCS Referring Phys: [id number] Reyne DumasSTEVI W [facility] Center[facility] IMPRESSIONS  1. Left ventricular ejection fraction, by estimation, is 65 to 70%. The left ventricle has normal function. The left ventricle has no regional wall motion abnormalities. Left ventricular diastolic parameters were  normal.  2. Right ventricular systolic function is normal. The right ventricular size is normal. Tricuspid regurgitation signal is inadequate for assessing PA pressure.  3. The mitral valve is normal in structure. No evidence of mitral valve regurgitation.  4. The aortic valve was not well visualized. Aortic valve regurgitation is not visualized. No aortic stenosis is present.  5. The inferior vena cava is normal in size with greater than 50% respiratory variability, suggesting right atrial pressure of 3 mmHg. FINDINGS  Left Ventricle: Left ventricular ejection fraction, by estimation, is 65 to 70%. The left ventricle has normal function. The left ventricle has no regional wall motion abnormalities. The left ventricular internal cavity size was normal in size. There is  no left ventricular hypertrophy. Left ventricular diastolic parameters were normal. Right Ventricle: The right ventricular size is normal. No increase in right ventricular wall thickness. Right ventricular systolic function is normal. Tricuspid regurgitation signal is inadequate for assessing PA pressure. Left Atrium: Left atrial size was normal in size. Right Atrium: Right atrial size was normal in size. Pericardium: There is no evidence of pericardial effusion. Mitral Valve: The mitral valve is normal in structure. No evidence of mitral valve regurgitation. Tricuspid Valve: The tricuspid valve is normal in structure. Tricuspid valve regurgitation is not demonstrated. Aortic Valve: The aortic valve was not well visualized. Aortic valve regurgitation is not visualized. No aortic stenosis is present. Aortic valve mean gradient measures 3.0 mmHg. Aortic valve peak gradient measures 6.7 mmHg. Aortic valve area, by VTI measures 2.77 cm. Pulmonic Valve: The pulmonic valve was not well visualized. Pulmonic valve regurgitation is not visualized. Aorta: The aortic root and ascending aorta are structurally normal, with no evidence of dilitation. Venous: The  inferior vena cava is normal in size with greater than 50% respiratory variability, suggesting right atrial pressure of 3 mmHg. IAS/Shunts: The interatrial septum was not well visualized.  LEFT VENTRICLE PLAX 2D LVIDd:         4.90 cm  Diastology LVIDs:         2.80 cm  LV e' medial:    8.38 cm/s LV PW:         0.80 cm  LV E/e' medial:  9.0 LV IVS:        0.90 cm  LV e' lateral:   9.14 cm/s LVOT diam:     1.90 cm  LV E/e' lateral: 8.2 LV SV:         69 LV SV Index:   37 LVOT Area:     2.84 cm  RIGHT VENTRICLE RV Basal diam:  2.90 cm LEFT ATRIUM           Index       RIGHT ATRIUM           Index LA diam:      3.10 cm 1.69 cm/m  RA Area:     11.20 cm LA Vol (A2C): 26.8 ml 14.61 ml/m RA Volume:   21.90 ml  11.94 ml/m LA Vol (A4C): 39.2 ml 21.36 ml/m  AORTIC VALVE AV Area (Vmax):    2.64 cm AV Area (Vmean):   2.59 cm AV Area (VTI):     2.77 cm AV Vmax:           129.00 cm/s AV Vmean:          86.700 cm/s AV VTI:            0.248 m AV Peak Grad:      6.7 mmHg AV Mean Grad:      3.0 mmHg LVOT Vmax:         120.00 cm/s LVOT Vmean:        79.200 cm/s LVOT VTI:          0.242 m LVOT/AV VTI ratio: 0.98  AORTA Ao Root diam: 2.50 cm Ao Asc diam:  3.30 cm MITRAL VALVE MV Area (PHT): 3.60 cm     SHUNTS MV Decel Time: 211 msec     Systemic  VTI:  0.24 m MV E velocity: 75.30 cm/s   Systemic Diam: 1.90 cm MV A velocity: 107.00 cm/s MV E/A ratio:  0.70 Epifanio Lesches MD Electronically signed by Epifanio Lesches MD Signature Date/Time: 05/11/2021/5:28:15 PM    Final    CT HEAD CODE STROKE WO CONTRAST  Result Date: 05/23/2021 CLINICAL DATA:  Code stroke.  Acute stroke. EXAM: CT HEAD WITHOUT CONTRAST TECHNIQUE: Contiguous axial images were obtained from the base of the skull through the vertex without intravenous contrast. COMPARISON:  Head CT from 05/11/2021 FINDINGS: Brain: 5.5 x 2.8 cm hematoma in the subcortical high right frontal region with extensive vasogenic edema in the adjacent brain that is progressed.  There is worsening local mass effect with midline shift now measuring 13 mm. No entrapment. No cytotoxic edema seen. Vascular: No hyperdense vessel or unexpected calcification. Skull: Normal. Negative for fracture or focal lesion. Sinuses/Orbits: Negative Other: Critical Value/emergent results were called by telephone at the time of interpretation on 05/23/2021 at 9:10 am to provider [facility] , who verbally acknowledged these results. ASPECTS [facility] Stroke Program Early CT Score) Not scored in this setting IMPRESSION: Progressed right frontal hematoma and vasogenic edema with midline shift now measuring 13 mm. Electronically Signed   By: Marnee Spring M.D.   On: 05/23/2021 09:12   CT HEAD CODE STROKE WO CONTRAST  Result Date: 05/11/2021 CLINICAL DATA:  Code stroke. Left-sided weakness, nausea, and vomiting. EXAM: CT HEAD WITHOUT CONTRAST TECHNIQUE: Contiguous axial images were obtained from the base of the skull through the vertex without intravenous contrast. COMPARISON:  None. FINDINGS: Brain: An acute parenchymal hemorrhage in the high posterior right frontal lobe measures 4.2 x 2.3 x 3.1 cm (approximate volume of 15 mL). There is mild surrounding edema without midline shift, and there is a small amount of adjacent subarachnoid hemorrhage. A very small amount of coexistent subdural hemorrhage is also not excluded in this region. No acute infarct is identified elsewhere. The ventricles are normal in size. Hypodensities in the cerebral white matter bilaterally are nonspecific but compatible with mild chronic small vessel ischemic disease. Vascular: No hyperdense vessel. Skull: No fracture or suspicious osseous lesion. Sinuses/Orbits: Small osteoma in the left frontal sinus. Mild right and moderate left maxillary sinus mucosal thickening. Clear mastoid air cells. Unremarkable orbits. Other: None. ASPECTS [facility] Stroke Program Early CT Score) Not scored in the presence of acute hemorrhage. IMPRESSION:  Acute parenchymal hemorrhage in the posterior right frontal lobe with small amount of adjacent subarachnoid hemorrhage. These results were communicated to Dr. Iver Nestle at 9:08 am on 05/11/2021 by text page via the [facility] messaging system. Electronically Signed   By: Sebastian Ache M.D.   On: 05/11/2021 09:12   CT ANGIO HEAD NECK W WO CM (CODE STROKE)  Result Date: 05/23/2021 CLINICAL DATA:  Enlarging hematoma EXAM: CT ANGIOGRAPHY HEAD AND NECK TECHNIQUE: Multidetector CT imaging of the head and neck was performed using the standard protocol during bolus administration of intravenous contrast. Multiplanar CT image reconstructions and MIPs were obtained to evaluate the vascular anatomy. Carotid stenosis measurements (when applicable) are obtained utilizing NASCET criteria, using the distal internal carotid diameter as the denominator. CONTRAST:  42mL OMNIPAQUE IOHEXOL 350 MG/ML SOLN COMPARISON:  CTA 05/11/2021 FINDINGS: CTA NECK FINDINGS Aortic arch: Normal Right carotid system: Widely patent vessels with smooth contour. No atheromatous changes Left carotid system: Widely patent vessels with smooth contour. No noted atheromatous changes Vertebral arteries: No proximal subclavian stenosis. The vertebral arteries are widely patent. No suspected vertebral  beading when allowing for streak artifact. Skeleton: Multilevel degenerative disc narrowing and ridging canal and foraminal encroachment the lower cervical levels. Other neck: No evidence of inflammation or mass. Upper chest: Negative Review of the MIP images confirms the above findings CTA HEAD FINDINGS Anterior circulation: Major vessels are smooth and widely patent. No branch occlusion, generalized beading, or aneurysm. There is a cortical branch at the right frontal parietal convexity which is not continuous beyond the level of the upper and lateral hematoma. No visible nidus or early enhancing veins. Posterior circulation: Vertebral and basilar arteries are smooth  and widely patent. No branch occlusion, beading, or aneurysm. Venous sinuses: MRV was obtained previously. The veins are largely nonenhancing on this study. Anatomic variants: None significant Review of the MIP images confirms the above findings IMPRESSION: Seemingly truncated arterial cortical branch at the upper and lateral hematoma, without discrete vascular malformation or spot sign. Recommend follow-up after resolution of mass effect to evaluate for small AVM. Electronically Signed   By: Marnee Spring M.D.   On: 05/23/2021 09:54   CT ANGIO HEAD NECK W WO CM (CODE STROKE)  Result Date: 05/11/2021 CLINICAL DATA:  Stroke follow-up.  Left-sided weakness. EXAM: CT ANGIOGRAPHY HEAD AND NECK TECHNIQUE: Multidetector CT imaging of the head and neck was performed using the standard protocol during bolus administration of intravenous contrast. Multiplanar CT image reconstructions and MIPs were obtained to evaluate the vascular anatomy. Carotid stenosis measurements (when applicable) are obtained utilizing NASCET criteria, using the distal internal carotid diameter as the denominator. CONTRAST:  50mL OMNIPAQUE IOHEXOL 350 MG/ML SOLN COMPARISON:  Same day CT head. FINDINGS: CTA NECK FINDINGS Aortic arch: Great vessel origins are patent. Right carotid system: No evidence of dissection, stenosis (50% or greater) or occlusion. Mild apparent irregularity of the ICA at the skull base in a region of streak artifact. Left carotid system: No evidence of dissection, stenosis (50% or greater) or occlusion. Mild apparent irregularity of the ICA at the skull base in a region of streak artifact. Vertebral arteries: Codominant. No evidence of dissection, stenosis (50% or greater) or occlusion. Skeleton: Moderate degenerative disc disease at at C5-C6 and C6-C7 with disc height loss, endplate sclerosis and posterior disc osteophyte complexes. Other neck: No acute abnormality Upper chest: Visualized lung apices are clear. Review of  the MIP images confirms the above findings CTA HEAD FINDINGS Anterior circulation: No large vessel occlusion or proximal hemodynamically significant stenosis. Evaluation of the distal vasculature is limited due to venous contamination. No evidence of and arteriovenous malformation or aneurysm in the region of intraparenchymal hemorrhage, although acute blood products limit evaluation. Posterior circulation: No large vessel occlusion or proximal hemodynamically significant stenosis. Venous sinuses: The superior sagittal sinus is narrowed in the region of hemorrhage, most likely from mass effect. Small right transverse and sigmoid sinuses. Review of the MIP images confirms the above findings IMPRESSION: CTA Head: 1. No large vessel occlusion or hemodynamically significant proximal arterial stenosis. 2. The superior sagittal sinus is narrowed in the region of hemorrhage with poorly visualized draining cortical veins in this region. While indeterminate, this may be secondary to mass effect given no definite/clear intraluminal thrombus. Given these findings and the location of hemorrhage; however, recommend low threshold for follow-up CTV or MRI with contrast to ensure stability and exclude worsening thrombosis. 3. No evidence of and arteriovenous malformation or aneurysm in the region of intraparenchymal hemorrhage, although acute blood products limit evaluation. Follow-up imaging after resolution of hemorrhage could provide further assessment if clinically indicated.  CTA Neck: 1. No significant (greater than 50%) stenosis. 2. Mild apparent irregularity of bilateral ICAs at the skull base is favored artifactual given streak artifact from dental amalgam in this region. Fibromuscular dysplasia is a differential consideration. Findings and recommendations discussed with Dr. Iver Nestle via telephone at 9:59 a.m. Electronically Signed   By: Feliberto Harts MD   On: 05/11/2021 10:09     PHYSICAL EXAM  Temp:  [98.2 F (36.8  C)-102.6 F (39.2 C)] 100.9 F (38.3 C) (06/14 1200) Pulse Rate:  [89-122] 108 (06/14 1200) Resp:  [16-29] 20 (06/14 1200) BP: (96-150)/(63-98) 132/80 (06/14 1200) SpO2:  [95 %-99 %] 98 % (06/14 1200) Weight:  [66.5 kg] 66.5 kg (06/14 0500)  General - Well nourished, well developed, sleepy.  Ophthalmologic - fundi not visualized due to noncooperation.  Cardiovascular - Regular rhythm and tachycardia.  Neuro - sleepy with eyes closed, however, able to name and repeat, following central commands and peripheral commands on the right. Severe dysarthria, orientated to self but not to age or place or time. With forced eye opening, PERRL, right gaze preference, not cross midline. Not blinking to visual threat bilaterally. Left facial droop. Tongue protrusion not cooperative. Right UE spontaneous movement against gravity and RLE some spontaneous movement but not against gravity, LUE and LLE plegic with increased muscle tone. Sensation, coordination not cooperative and gait not tested.   ASSESSMENT/PLAN [name] is a 65 y.o. male with history of dementia with behavioral disturbance and OSA on CPAP admitted for right gaze and left hemoplegia, left facial droop. 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 He was stabilized and sent to CIR. 6/12 found to be lethargic and drowsy, CT repeat showed increased cerebral edema and midline shift. EEG no seizure s/p ativan and keppra. He was transfer back to ICU for further management.   Encephalopathy  Improving Likely combination of cerebral edema, ? Seiziure, fever and AKI Treat underlying condition as below Neuro check closely  Cerebral edema  CT head 6/12 showed progression of right frontal ICH and cerebral edema with midline shift CTA head and neck 6/12 no discrete AVM or spot sign On 3% saline 50->65->30 cc/h Na Q6h Na 135->140->144->151 Na goal 145-155  ? 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 No CSVT on venous phase CT repeat mildly increased left frontal ICH 05/12/21 MRI brain no significant change of ICH since last CT MRV Non dominant right transverse and sigmoid dural venous sinuses. Cerebral angiogram pending 2D Echo  EF 65-70% LDL 140 HgbA1c 5.8 UDS neg Heparin subq for VTE prophylaxis No antithrombotic prior to admission, now on No antithrombotic given ICH Ongoing aggressive stroke risk factor management Therapy recommendations:  pending Disposition:  Pending   ?? 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  Dementia  Has appointment with [facility] for LP and evaluation of dementia, but pt not able to go due to onset of ICH Delirium precaution Continue outpt follow up with [facility]  Hyperlipidemia Home meds:  none  LDL 140, goal < 70 Will consider low dose statin (lipitor 20) on discharge   Dysphagia  Speech to follow NPO  S/p cortrak on TF @ 50  Fever and leukocytosis Tmax 101.4->102.6->100.9 Leukocytosis WBC 7.6->12.4 ->8.1->7.8->22.9->20.4 CCM on board Continue monitoring UA neg CXR Minimal left basilar atelectasis  Other Stroke Risk Factors Advanced age Obstructive sleep apnea, on CPAP at home  Other Active Problems AKI Cre 1.20->1.45->1.03 - on IVF and  TF  Hospital day # 2  This patient is critically ill due to ICH, cerebral edema, AKI, ? Seizure, fever and leukocytosis and at significant risk of neurological worsening, death form brain herniation, status epilepticus, sepsis, renal failure, recurrent ICH. This patient's care requires constant monitoring of vital signs, hemodynamics, respiratory and cardiac monitoring, review of multiple databases, neurological assessment, discussion with family, other specialists and medical decision making of high complexity. I spent 35 minutes of neurocritical care time in the care of this patient.   Marvel Plan, MD PhD Stroke Neurology 05/25/2021 1:36 PM    To contact Stroke Continuity provider, please refer to [email or web address]. After hours, contact General Neurology

## 2021-05-25 NOTE — Progress Notes (Signed)
SLP Cancellation Note  Patient Details Name: Donald [name] MRN: [id number] DOB: October 22, 1956   Cancelled treatment:       Reason Eval/Treat Not Completed: Patient's level of consciousness;Medical issues which prohibited therapy  RN reports pt continues to be inappropriate to assess readiness for PO intake. NGT in place. Will continue efforts.   Kaziyah Parkison B. Murvin Natal, [facility], CCC-SLP Speech Language Pathologist Office: [phone] Pager: [phone]  Leigh Aurora 05/25/2021, 11:59 AM

## 2021-05-25 NOTE — Procedures (Addendum)
Patient Name: Donald [name]  MRN: [id number]  Epilepsy Attending: Charlsie Quest  Referring Physician/Provider: Dr Bing Neighbors Duration: 05/24/2021 1655 to 05/25/2021 1238   Patient history:  65 y.o. male currently admitted for ICH on 05/11/21 developed somnolence and aphasia with R gaze deviation that resolved with ativan. EEG to evaluate for seizure   Level of alertness: awake, asleep   AEDs during EEG study: LEV   Technical aspects: This EEG study was done with scalp electrodes positioned according to the 10-20 International system of electrode placement. Electrical activity was acquired at a sampling rate of 500Hz  and reviewed with a high frequency filter of 70Hz  and a low frequency filter of 1Hz . EEG data were recorded continuously and digitally stored.   Description: No posterior dominant rhythm was seen. Sleep was characterized by sleep spindles (12 to 14 Hz), asymmetry ( R<L) maximal frontocentral region. EEG showed continuous generalized and lateralized right hemisphere 3 to 6 Hz theta-delta slowing. Hyperventilation and photic stimulation were not performed.      ABNORMALITY - Continuous slow, generalized and lateralized right hemisphere - Spindle asymmetry, right<left   IMPRESSION: This study is suggestive of cortical dysfunction in right hemisphere likely secondary to underlying structural abnormality.  Additionally there is moderate to severe diffuse encephalopathy, nonspecific etiology. No seizures or definite epileptiform discharges were seen throughout the recording.     Donald [name] 

## 2021-05-26 ENCOUNTER — Inpatient Hospital Stay (HOSPITAL_COMMUNITY): Payer: No Typology Code available for payment source

## 2021-05-26 DIAGNOSIS — R509 Fever, unspecified: Secondary | ICD-10-CM

## 2021-05-26 LAB — BASIC METABOLIC PANEL
Anion gap: 9 (ref 5–15)
BUN: 23 mg/dL (ref 8–23)
CO2: 28 mmol/L (ref 22–32)
Calcium: 9.7 mg/dL (ref 8.9–10.3)
Chloride: 121 mmol/L — ABNORMAL HIGH (ref 98–111)
Creatinine, Ser: 1.02 mg/dL (ref 0.61–1.24)
GFR, Estimated: >60 mL/min (ref >60)
Glucose, Bld: 168 mg/dL — ABNORMAL HIGH (ref 70–99)
Potassium: 4 mmol/L (ref 3.5–5.1)
Sodium: 158 mmol/L — ABNORMAL HIGH (ref 135–145)

## 2021-05-26 LAB — CBC
HCT: 43.9 % (ref 39.0–52.0)
Hemoglobin: 14 g/dL (ref 13.0–17.0)
MCH: 30.1 pg (ref 26.0–34.0)
MCHC: 31.9 g/dL (ref 30.0–36.0)
MCV: 94.4 fL (ref 80.0–100.0)
Platelets: 360 10*3/uL (ref 150–400)
RBC: 4.65 MIL/uL (ref 4.22–5.81)
RDW: 14.6 % (ref 11.5–15.5)
WBC: 24.8 10*3/uL — ABNORMAL HIGH (ref 4.0–10.5)
nRBC: 0 % (ref 0.0–0.2)

## 2021-05-26 LAB — PROCALCITONIN: Procalcitonin: 0.86 ng/mL

## 2021-05-26 LAB — GLUCOSE, CAPILLARY
Glucose-Capillary: 104 mg/dL — ABNORMAL HIGH (ref 70–99)
Glucose-Capillary: 155 mg/dL — ABNORMAL HIGH (ref 70–99)
Glucose-Capillary: 167 mg/dL — ABNORMAL HIGH (ref 70–99)
Glucose-Capillary: 171 mg/dL — ABNORMAL HIGH (ref 70–99)
Glucose-Capillary: 184 mg/dL — ABNORMAL HIGH (ref 70–99)
Glucose-Capillary: 186 mg/dL — ABNORMAL HIGH (ref 70–99)

## 2021-05-26 LAB — SODIUM: Sodium: 155 mmol/L — ABNORMAL HIGH (ref 135–145)

## 2021-05-26 MED ORDER — SODIUM CHLORIDE 0.9 % IV SOLN
2.0000 g | INTRAVENOUS | Status: AC
  Administered 2021-05-26 – 2021-05-30 (×5): 2 g via INTRAVENOUS
  Filled 2021-05-26: qty 2
  Filled 2021-05-26: qty 20
  Filled 2021-05-26 (×4): qty 2

## 2021-05-26 MED ORDER — ENOXAPARIN SODIUM 40 MG/0.4ML IJ SOSY
40.0000 mg | PREFILLED_SYRINGE | Freq: Every day | INTRAMUSCULAR | Status: DC
  Administered 2021-05-26: 40 mg via SUBCUTANEOUS
  Filled 2021-05-26: qty 0.4

## 2021-05-26 NOTE — Progress Notes (Signed)
STROKE TEAM PROGRESS NOTE   SUBJECTIVE (INTERVAL HISTORY) His wife is at the bedside.  Patient lying in bed, seems more sleepy and drowsy than yesterday. Hard to arouse but with repetitive stimulation, he was able to make random sounds out but not meaning for sentences. However, he was still able to follow commands on the right hand and foot. No fever this morning but 102.4 last evening. Worsening leukocytosis. Na 158 off 3% saline. Now 155.   OBJECTIVE Temp:  [97.8 F (36.6 C)-102.4 F (39.1 C)] 98.1 F (36.7 C) (06/15 0800) Pulse Rate:  [94-117] 101 (06/15 1000) Cardiac Rhythm: Normal sinus rhythm;Sinus tachycardia (06/15 0800) Resp:  [14-27] 14 (06/15 1000) BP: (117-158)/(65-91) 131/73 (06/15 1000) SpO2:  [96 %-100 %] 99 % (06/15 1000) Weight:  [66 kg] 66 kg (06/15 0359)  Recent Labs  Lab 05/25/21 1555 05/25/21 1958 05/25/21 2341 05/26/21 0356 05/26/21 0814  GLUCAP 169* 172* 171* 184* 155*   Recent Labs  Lab 05/21/21 0532 05/24/21 0134 05/24/21 1610 05/24/21 1604 05/24/21 1929 05/25/21 0141 05/25/21 0743 05/25/21 1429 05/25/21 2306 05/26/21 0335 05/26/21 0912  NA 134* 135   < > 141   < > 144  145 151* 154* 156* 158* 155*  K 4.3 4.4  --   --   --  4.4  --   --   --  4.0  --   CL 95* 96*  --   --   --  115*  --   --   --  121*  --   CO2 29 27  --   --   --  23  --   --   --  28  --   GLUCOSE 133* 135*  --   --   --  200*  --   --   --  168*  --   BUN 20 27*  --   --   --  29*  --   --   --  23  --   CREATININE 1.20 1.45*  --   --   --  1.03  --   --   --  1.02  --   CALCIUM 10.0 10.1  --   --   --  9.2  --   --   --  9.7  --   MG  --  2.5*  --  2.5*  --  2.6*  --  2.5*  --   --   --   PHOS  --  3.6  --  3.2  --  2.1*  --  1.9*  --   --   --    < > = values in this interval not displayed.   Recent Labs  Lab 05/21/21 0532  AST 27  ALT 25  ALKPHOS 100  BILITOT 0.9  PROT 7.7  ALBUMIN 4.0   Recent Labs  Lab 05/21/21 0532 05/24/21 0134 05/25/21 0141  05/26/21 0335  WBC 8.7 22.9* 20.4* 24.8*  NEUTROABS 5.4  --   --   --   HGB 16.2 16.3 14.7 14.0  HCT 47.5 49.6 44.1 43.9  MCV 88.3 90.0 90.9 94.4  PLT 292 309 268 360   No results for input(s): CKTOTAL, CKMB, CKMBINDEX, TROPONINI in the last 168 hours. No results for input(s): LABPROT, INR in the last 72 hours. Recent Labs    05/24/21 2224  COLORURINE YELLOW  LABSPEC 1.023  PHURINE 6.0  GLUCOSEU 50*  HGBUR MODERATE*  BILIRUBINUR NEGATIVE  Donald [name]  NEGATIVE  PROTEINUR 30*  NITRITE POSITIVE*  LEUKOCYTESUR NEGATIVE       Component Value Date/Time   CHOL 232 (H) 05/11/2021 1118   CHOL 204 (H) 09/11/2020 1449   TRIG 73 05/11/2021 1118   HDL 77 05/11/2021 1118   HDL 63 09/11/2020 1449   CHOLHDL 3.0 05/11/2021 1118   VLDL 15 05/11/2021 1118   LDLCALC 140 (H) 05/11/2021 1118   LDLCALC 132 (H) 09/11/2020 1449   LDLCALC 107 (H) 04/29/2020 0826   Lab Results  Component Value Date   HGBA1C 5.8 (H) 05/11/2021      Component Value Date/Time   LABOPIA NONE DETECTED 05/11/2021 1109   COCAINSCRNUR NONE DETECTED 05/11/2021 1109   LABBENZ NONE DETECTED 05/11/2021 1109   AMPHETMU NONE DETECTED 05/11/2021 1109   THCU NONE DETECTED 05/11/2021 1109   LABBARB NONE DETECTED 05/11/2021 1109    No results for input(s): ETH in the last 168 hours.   I have personally reviewed the radiological images below and agree with the radiology interpretations.  DG Abd 1 View  Result Date: 05/20/2021 CLINICAL DATA:  Abdominal pain. EXAM: ABDOMEN - 1 VIEW COMPARISON:  None. FINDINGS: Large amount of intestinal gas throughout small and large bowel suggesting ileus. There does not appear to be a large amount of fecal matter. No focal obstruction is evident. No free air. No abnormal calcifications or significant bone findings. IMPRESSION: Large amount of intestinal gas suggesting ileus. Electronically Signed   By: Paulina Fusi M.D.   On: 05/20/2021 19:25   CT HEAD WO CONTRAST  Result Date:  05/23/2021 CLINICAL DATA:  Follow-up intracerebral hemorrhage EXAM: CT HEAD WITHOUT CONTRAST TECHNIQUE: Contiguous axial images were obtained from the base of the skull through the vertex without intravenous contrast. COMPARISON:  CT 05/23/2021 FINDINGS: Brain: No significant interval change in the overall size of the intraparenchymal hemorrhagic component seen in the right frontal with some extensive surrounding hypo attenuating edematous changes. Locoregional mass effect with sulcal effacement and effacement of the right lateral ventricle as well as stable subfalcine herniation with a right to left midline shift of approximately 14 mm. Slight medialization of the right uncus without clear transtentorial herniation is also unchanged from prior. Basal cisterns remain patent. No new sites of hemorrhage. No new loss of gray-white differentiation. Vascular: No hyperdense vessel or unexpected calcification. Skull: Normal. Negative for fracture or focal lesion. EEG leads in place. Sinuses/Orbits: Mild mural thickening in the maxillary sinuses. Paranasal sinuses and mastoid air cells are otherwise predominantly clear. Included orbital structures are unremarkable. Other: None. IMPRESSION: 1. Stable appearance of the right frontal lobe intraparenchymal hemorrhage with surrounding cerebral edema, local mass effect as well as subfalcine midline shift of approximately 14 mm in slight medial ization of the right uncus, not significantly changed from comparison imaging. 2. No new sites of hemorrhage or new gray-white differentiation loss. Electronically Signed   By: Kreg Shropshire M.D.   On: 05/23/2021 23:58   CT HEAD WO CONTRAST  Result Date: 05/23/2021 CLINICAL DATA:  Follow-up right frontal parenchymal hemorrhage seen earlier today. EXAM: CT HEAD WITHOUT CONTRAST TECHNIQUE: Contiguous axial images were obtained from the base of the skull through the vertex without intravenous contrast. COMPARISON:  Earlier today. FINDINGS:  Brain: No significant change in the previously described high right frontal lobe parenchymal hematoma with extensive surrounding edema and midline shift to the left. The midline shift currently measures 10 mm on image number 16/3. No interval ventricular enlargement. No new hemorrhage. Vascular: No hyperdense vessel  or unexpected calcification. Skull: Normal. Negative for fracture or focal lesion. Sinuses/Orbits: Stable small left frontal sinus osteoma. Mild moderate left maxillary sinus mucosal thickening without significant change. Unremarkable orbits. Other: Bilateral concha bullosa. IMPRESSION: 1. Stable large high right frontal parenchymal hematoma with extensive associated edema and midline shift to the left. 2. Stable left maxillary chronic sinusitis. Electronically Signed   By: Beckie SaltsSteven  Reid M.D.   On: 05/23/2021 18:00   CT HEAD WO CONTRAST  Result Date: 05/23/2021 CLINICAL DATA:  Cerebral hemorrhage, decreased responsiveness EXAM: CT HEAD WITHOUT CONTRAST TECHNIQUE: Contiguous axial images were obtained from the base of the skull through the vertex without intravenous contrast. COMPARISON:  Earlier same day FINDINGS: Brain: Parenchymal hematoma centered within the high right frontal lobe does not measure substantially changed from the prior study. Extensive surrounding edema is again noted with regional mass effect. Leftward midline shift measures similar at 14 mm at the level of the septum pellucidum. Ventricle caliber is unchanged with no new trapping. No significant central herniation. There is no new loss of gray-white differentiation. Intraventricular hemorrhage is identified. Vascular: No new findings. Skull: Unremarkable. Sinuses/Orbits: No acute finding. Other: None. IMPRESSION: No substantial change in right frontal parenchymal hemorrhage, edema, and mass effect including midline shift. No new ventricle trapping or acute infarction. Electronically Signed   By: Guadlupe SpanishPraneil  Patel M.D.   On:  05/23/2021 12:49   CT HEAD WO CONTRAST  Result Date: 05/11/2021 CLINICAL DATA:  Stroke.  Intracranial hemorrhage follow-up. EXAM: CT HEAD WITHOUT CONTRAST TECHNIQUE: Contiguous axial images were obtained from the base of the skull through the vertex without intravenous contrast. COMPARISON:  Same day head CT. FINDINGS: Brain: Mild interval increase in the size of the right frontal intraparenchymal hemorrhage, measuring 4.5 x 2.6 x 3.5 cm (previously 4.2 x 2.3 x 3.1 cm). Similar adjacent small volume subarachnoid hemorrhage. A small amount of subdural hemorrhage in this region is also not excluded. Surrounding edema is similar. Similar local mass effect without midline shift. Basal cisterns are patent. No evidence of acute large vascular territory infarct elsewhere. Similar mild white matter hypodensities, likely chronic microvascular ischemic disease. No hydrocephalus. Vascular: No hyperdense vessel.  Calcific atherosclerosis. Skull: No acute fracture. Sinuses/Orbits: Small left frontal sinus osteoma. Mild right and moderate left maxillary sinus mucosal thickening. Unremarkable orbits. Other: No mastoid effusions. IMPRESSION: Mild interval increase in the size of the right frontal intraparenchymal hemorrhage, measuring 4.5 x 2.6 x 3.5 cm (previously 4.2 x 2.3 x 3.1 cm). Similar adjacent small volume hemorrhage. Electronically Signed   By: Feliberto HartsFrederick S Jones MD   On: 05/11/2021 17:05   MR BRAIN WO CONTRAST  Result Date: 05/12/2021 CLINICAL DATA:  65 year old male code stroke presentation with intra-axial right hemisphere hemorrhage. Hypertensive (194/104) on presentation. EXAM: MRI HEAD WITHOUT CONTRAST TECHNIQUE: Multiplanar, multiecho pulse sequences of the brain and surrounding structures were obtained without intravenous contrast. COMPARISON:  CT head CTA head and neck 05/11/2021 FINDINGS: Brain: Coronal T2 weighted imaging could not be obtained. And some of the exam is intermittently degraded by motion  artifact despite repeated imaging attempts. T1 isointense intra-axial hemorrhage in the posterior right frontal lobe demonstrates a layering hematocrit level (series 13, image 19) and blood encompasses 43 by 38 x 33 mm (AP by transverse by CC) for an estimated volume of 26 mL. Regional edema. Mild regional mass effect. Trace subarachnoid hemorrhage also suspected on axial FLAIR imaging. Additionally, on SWI there is also chronic hemosiderin in the right parietal lobe on series 19 image  34. But no other definite chronic blood products. Susceptibility related abnormal diffusion in the posterior right frontal lobe with no convincing larger area of restricted diffusion. No restricted diffusion elsewhere. No IVH or ventriculomegaly. Normal basilar cisterns. Negative pituitary and cervicomedullary junction. Wallace Cullens and white matter signal outside of the affected right hemisphere is largely normal for age with mild nonspecific white matter changes. Vascular: Major intracranial vascular flow voids are preserved. Skull and upper cervical spine: Visualized bone marrow signal is within normal limits. Grossly negative cervical spine. Sinuses/Orbits: Negative orbits. Mild to moderate maxillary sinus mucosal thickening. Other: Mastoids are clear. Grossly normal visible internal auditory structures. IMPRESSION: 1. Posterior right frontal lobe intra-axial hemorrhage with layering hematocrit level has not significantly changed (estimated blood volume of 26 mL). Surrounding edema and mild regional mass effect. Trace subarachnoid hemorrhage. 2. No larger underlying infarct is evident. But chronic hemosiderin in the right parietal lobe indicates a previous intra-axial hemorrhage in the region. 3. No other acute intracranial abnormality. Electronically Signed   By: Odessa Fleming M.D.   On: 05/12/2021 07:26   DG CHEST PORT 1 VIEW  Result Date: 05/24/2021 CLINICAL DATA:  Leukocytosis EXAM: PORTABLE CHEST 1 VIEW COMPARISON:  04/28/15 FINDINGS:  Cardiac shadow is within normal limits. Mild aortic calcifications are noted. The lungs are well aerated bilaterally with minimal left basilar atelectasis. No bony abnormality is seen. IMPRESSION: Minimal left basilar atelectasis. Electronically Signed   By: Alcide Clever M.D.   On: 05/24/2021 10:52   DG Abd Portable 1V  Result Date: 05/24/2021 CLINICAL DATA:  Feeding tube placement EXAM: PORTABLE ABDOMEN - 1 VIEW COMPARISON:  05/20/2021 FINDINGS: Limited radiograph of the lower chest and upper abdomen was obtained for the purposes of enteric tube localization. Enteric tube is seen coursing below the diaphragm with distal tip terminating within the expected location of the gastric body. Air-filled large and small bowel loops throughout the abdomen, similar to prior. IMPRESSION: Enteric tube tip terminates within the gastric body. Electronically Signed   By: Duanne Guess D.O.   On: 05/24/2021 13:50   EEG adult  Result Date: 05/23/2021 Charlsie Quest, MD     05/23/2021  4:11 PM Patient Name: Donald [name] MRN: [id number] Epilepsy Attending: Charlsie Quest Referring Physician/Provider: Dr Bing Neighbors Date: 05/23/2021 Duration: 25.36 mins Patient history:  65 y.o. male currently admitted for ICH on 05/11/21 developed somnolence and aphasia with R gaze deviation that resolved with ativan. EEG to evaluate for seizure Level of alertness: asleep AEDs during EEG study: LEV Technical aspects: This EEG study was done with scalp electrodes positioned according to the 10-20 International system of electrode placement. Electrical activity was acquired at a sampling rate of  and reviewed with a high frequency filter of  and a low frequency filter of . EEG data were recorded continuously and digitally stored. Description: No posterior dominant rhythm was seen. Sleep was characterized by sleep spindles (12 to 14 Hz), maximal frontocentral region. EEG showed continuous generalized 3 to 6 Hz theta-delta  slowing. There is an excessive amount of 15 to 18 Hz, 2-3 uV beta activity distributed symmetrically and diffusely.  Hyperventilation and photic stimulation were not performed.   ABNORMALITY - Continuous slow, generalized - Excessive beta, generalized IMPRESSION: This study is suggestive of moderate to severe diffuse encephalopathy, nonspecific etiology. The excessive beta activity seen in the background is most likely due to the effect of benzodiazepine and is a benign EEG pattern. No seizures or epileptiform discharges were seen throughout the  recording. Priyanka Annabelle Harman   Overnight EEG with video  Result Date: 05/24/2021 Charlsie Quest, MD     05/25/2021  9:42 AM Patient Name: Donald [name] MRN: [id number] Epilepsy Attending: Charlsie Quest Referring Physician/Provider: Dr Bing Neighbors Duration: 05/23/2021 1655 to 05/24/2021 1655  Patient history:  65 y.o. male currently admitted for ICH on 05/11/21 developed somnolence and aphasia with R gaze deviation that resolved with ativan. EEG to evaluate for seizure  Level of alertness: awake, asleep  AEDs during EEG study: LEV  Technical aspects: This EEG study was done with scalp electrodes positioned according to the 10-20 International system of electrode placement. Electrical activity was acquired at a sampling rate of 500Hz  and reviewed with a high frequency filter of 70Hz  and a low frequency filter of 1Hz . EEG data were recorded continuously and digitally stored.  Description: No posterior dominant rhythm was seen. Sleep was characterized by sleep spindles (12 to 14 Hz), asymmetry ( R<L) maximal frontocentral region. EEG showed continuous generalized and lateralized right hemisphere 3 to 6 Hz theta-delta slowing. Hyperventilation and photic stimulation were not performed.    ABNORMALITY - Continuous slow, generalized and lateralized right hemisphere - Spindle asymmetry, right<left  IMPRESSION: This study is suggestive of cortical dysfunction in right  hemisphere likely secondary to underlying structural abnormality.  Additionally there is moderate to severe diffuse encephalopathy, nonspecific etiology. No seizures or definite epileptiform discharges were seen throughout the recording.   Charlsie Quest   MR MRV HEAD W WO CONTRAST  Result Date: 05/12/2021 CLINICAL DATA:  Intracranial hemorrhage. EXAM: MR VENOGRAM HEAD WITHOUT AND WITH CONTRAST TECHNIQUE: Angiographic images of the intracranial venous structures were acquired using MRV technique without and with intravenous contrast. CONTRAST:  7mL GADAVIST GADOBUTROL 1 MMOL/ML IV SOLN COMPARISON:  Brain MRI performed earlier today 05/12/2021. Noncontrast head CT examinations 05/11/2021. CT angiogram head/neck 05/11/2021. FINDINGS: The superior sagittal sinus, internal cerebral veins, vein of Galen, straight sinus, transverse sinuses, sigmoid sinuses and visualized jugular veins are patent. No appreciable intracranial venous thrombosis. Hypoplastic right transverse and sigmoid dural venous sinuses. An acute parenchymal hemorrhage within the posterior right frontal lobe is grossly unchanged in size as compared to the brain MRI performed earlier today. There is mild curvilinear vascular enhancement overlying the site of hemorrhage, which may be reactive. Elsewhere, no abnormal intracranial enhancement is identified. IMPRESSION: No evidence of intracranial venous thrombosis. Non dominant right transverse and sigmoid dural venous sinuses. Electronically Signed   By: Jackey Loge DO   On: 05/12/2021 11:27   ECHOCARDIOGRAM COMPLETE  Result Date: 05/11/2021    ECHOCARDIOGRAM REPORT   Patient Name:   [facility] Date of Exam: 05/11/2021 Medical Rec #:  [id number]        Height:       68.0 in Accession #:    [id number]       Weight:       155.2 lb Date of Birth:  10/09/1956         BSA:          1.835 m Patient Age:    65 years         BP:           115/77 mmHg Patient Gender: M                HR:           84  bpm. Exam Location:  Inpatient Procedure: 2D Echo, Cardiac Doppler and Color Doppler Indications:  Stroke I63.9  History:        Patient has no prior history of Echocardiogram examinations.  Sonographer:    Roosvelt Maser RDCS Referring Phys: [id number] Reyne Dumas [facility] IMPRESSIONS  1. Left ventricular ejection fraction, by estimation, is 65 to 70%. The left ventricle has normal function. The left ventricle has no regional wall motion abnormalities. Left ventricular diastolic parameters were normal.  2. Right ventricular systolic function is normal. The right ventricular size is normal. Tricuspid regurgitation signal is inadequate for assessing PA pressure.  3. The mitral valve is normal in structure. No evidence of mitral valve regurgitation.  4. The aortic valve was not well visualized. Aortic valve regurgitation is not visualized. No aortic stenosis is present.  5. The inferior vena cava is normal in size with greater than 50% respiratory variability, suggesting right atrial pressure of 3 mmHg. FINDINGS  Left Ventricle: Left ventricular ejection fraction, by estimation, is 65 to 70%. The left ventricle has normal function. The left ventricle has no regional wall motion abnormalities. The left ventricular internal cavity size was normal in size. There is  no left ventricular hypertrophy. Left ventricular diastolic parameters were normal. Right Ventricle: The right ventricular size is normal. No increase in right ventricular wall thickness. Right ventricular systolic function is normal. Tricuspid regurgitation signal is inadequate for assessing PA pressure. Left Atrium: Left atrial size was normal in size. Right Atrium: Right atrial size was normal in size. Pericardium: There is no evidence of pericardial effusion. Mitral Valve: The mitral valve is normal in structure. No evidence of mitral valve regurgitation. Tricuspid Valve: The tricuspid valve is normal in structure. Tricuspid valve regurgitation is not  demonstrated. Aortic Valve: The aortic valve was not well visualized. Aortic valve regurgitation is not visualized. No aortic stenosis is present. Aortic valve mean gradient measures 3.0 mmHg. Aortic valve peak gradient measures 6.7 mmHg. Aortic valve area, by VTI measures 2.77 cm. Pulmonic Valve: The pulmonic valve was not well visualized. Pulmonic valve regurgitation is not visualized. Aorta: The aortic root and ascending aorta are structurally normal, with no evidence of dilitation. Venous: The inferior vena cava is normal in size with greater than 50% respiratory variability, suggesting right atrial pressure of 3 mmHg. IAS/Shunts: The interatrial septum was not well visualized.  LEFT VENTRICLE PLAX 2D LVIDd:         4.90 cm  Diastology LVIDs:         2.80 cm  LV e' medial:    8.38 cm/s LV PW:         0.80 cm  LV E/e' medial:  9.0 LV IVS:        0.90 cm  LV e' lateral:   9.14 cm/s LVOT diam:     1.90 cm  LV E/e' lateral: 8.2 LV SV:         69 LV SV Index:   37 LVOT Area:     2.84 cm  RIGHT VENTRICLE RV Basal diam:  2.90 cm LEFT ATRIUM           Index       RIGHT ATRIUM           Index LA diam:      3.10 cm 1.69 cm/m  RA Area:     11.20 cm LA Vol (A2C): 26.8 ml 14.61 ml/m RA Volume:   21.90 ml  11.94 ml/m LA Vol (A4C): 39.2 ml 21.36 ml/m  AORTIC VALVE AV Area (Vmax):    2.64 cm AV Area (Vmean):  2.59 cm AV Area (VTI):     2.77 cm AV Vmax:           129.00 cm/s AV Vmean:          86.700 cm/s AV VTI:            0.248 m AV Peak Grad:      6.7 mmHg AV Mean Grad:      3.0 mmHg LVOT Vmax:         120.00 cm/s LVOT Vmean:        79.200 cm/s LVOT VTI:          0.242 m LVOT/AV VTI ratio: 0.98  AORTA Ao Root diam: 2.50 cm Ao Asc diam:  3.30 cm MITRAL VALVE MV Area (PHT): 3.60 cm     SHUNTS MV Decel Time: 211 msec     Systemic VTI:  0.24 m MV E velocity: 75.30 cm/s   Systemic Diam: 1.90 cm MV A velocity: 107.00 cm/s MV E/A ratio:  0.70 Epifanio Lesches MD Electronically signed by Epifanio Lesches MD  Signature Date/Time: 05/11/2021/5:28:15 PM    Final    CT HEAD CODE STROKE WO CONTRAST  Result Date: 05/23/2021 CLINICAL DATA:  Code stroke.  Acute stroke. EXAM: CT HEAD WITHOUT CONTRAST TECHNIQUE: Contiguous axial images were obtained from the base of the skull through the vertex without intravenous contrast. COMPARISON:  Head CT from 05/11/2021 FINDINGS: Brain: 5.5 x 2.8 cm hematoma in the subcortical high right frontal region with extensive vasogenic edema in the adjacent brain that is progressed. There is worsening local mass effect with midline shift now measuring 13 mm. No entrapment. No cytotoxic edema seen. Vascular: No hyperdense vessel or unexpected calcification. Skull: Normal. Negative for fracture or focal lesion. Sinuses/Orbits: Negative Other: Critical Value/emergent results were called by telephone at the time of interpretation on 05/23/2021 at 9:10 am to provider [facility] , who verbally acknowledged these results. ASPECTS [facility] Stroke Program Early CT Score) Not scored in this setting IMPRESSION: Progressed right frontal hematoma and vasogenic edema with midline shift now measuring 13 mm. Electronically Signed   By: Marnee Spring M.D.   On: 05/23/2021 09:12   CT HEAD CODE STROKE WO CONTRAST  Result Date: 05/11/2021 CLINICAL DATA:  Code stroke. Left-sided weakness, nausea, and vomiting. EXAM: CT HEAD WITHOUT CONTRAST TECHNIQUE: Contiguous axial images were obtained from the base of the skull through the vertex without intravenous contrast. COMPARISON:  None. FINDINGS: Brain: An acute parenchymal hemorrhage in the high posterior right frontal lobe measures 4.2 x 2.3 x 3.1 cm (approximate volume of 15 mL). There is mild surrounding edema without midline shift, and there is a small amount of adjacent subarachnoid hemorrhage. A very small amount of coexistent subdural hemorrhage is also not excluded in this region. No acute infarct is identified elsewhere. The ventricles are normal in  size. Hypodensities in the cerebral white matter bilaterally are nonspecific but compatible with mild chronic small vessel ischemic disease. Vascular: No hyperdense vessel. Skull: No fracture or suspicious osseous lesion. Sinuses/Orbits: Small osteoma in the left frontal sinus. Mild right and moderate left maxillary sinus mucosal thickening. Clear mastoid air cells. Unremarkable orbits. Other: None. ASPECTS [facility] Stroke Program Early CT Score) Not scored in the presence of acute hemorrhage. IMPRESSION: Acute parenchymal hemorrhage in the posterior right frontal lobe with small amount of adjacent subarachnoid hemorrhage. These results were communicated to Dr. Iver Nestle at 9:08 am on 05/11/2021 by text page via the [facility] messaging system. Electronically Signed  By: Sebastian Ache M.D.   On: 05/11/2021 09:12   CT ANGIO HEAD NECK W WO CM (CODE STROKE)  Result Date: 05/23/2021 CLINICAL DATA:  Enlarging hematoma EXAM: CT ANGIOGRAPHY HEAD AND NECK TECHNIQUE: Multidetector CT imaging of the head and neck was performed using the standard protocol during bolus administration of intravenous contrast. Multiplanar CT image reconstructions and MIPs were obtained to evaluate the vascular anatomy. Carotid stenosis measurements (when applicable) are obtained utilizing NASCET criteria, using the distal internal carotid diameter as the denominator. CONTRAST:  27mL OMNIPAQUE IOHEXOL 350 MG/ML SOLN COMPARISON:  CTA 05/11/2021 FINDINGS: CTA NECK FINDINGS Aortic arch: Normal Right carotid system: Widely patent vessels with smooth contour. No atheromatous changes Left carotid system: Widely patent vessels with smooth contour. No noted atheromatous changes Vertebral arteries: No proximal subclavian stenosis. The vertebral arteries are widely patent. No suspected vertebral beading when allowing for streak artifact. Skeleton: Multilevel degenerative disc narrowing and ridging canal and foraminal encroachment the lower cervical levels. Other  neck: No evidence of inflammation or mass. Upper chest: Negative Review of the MIP images confirms the above findings CTA HEAD FINDINGS Anterior circulation: Major vessels are smooth and widely patent. No branch occlusion, generalized beading, or aneurysm. There is a cortical branch at the right frontal parietal convexity which is not continuous beyond the level of the upper and lateral hematoma. No visible nidus or early enhancing veins. Posterior circulation: Vertebral and basilar arteries are smooth and widely patent. No branch occlusion, beading, or aneurysm. Venous sinuses: MRV was obtained previously. The veins are largely nonenhancing on this study. Anatomic variants: None significant Review of the MIP images confirms the above findings IMPRESSION: Seemingly truncated arterial cortical branch at the upper and lateral hematoma, without discrete vascular malformation or spot sign. Recommend follow-up after resolution of mass effect to evaluate for small AVM. Electronically Signed   By: Marnee Spring M.D.   On: 05/23/2021 09:54   CT ANGIO HEAD NECK W WO CM (CODE STROKE)  Result Date: 05/11/2021 CLINICAL DATA:  Stroke follow-up.  Left-sided weakness. EXAM: CT ANGIOGRAPHY HEAD AND NECK TECHNIQUE: Multidetector CT imaging of the head and neck was performed using the standard protocol during bolus administration of intravenous contrast. Multiplanar CT image reconstructions and MIPs were obtained to evaluate the vascular anatomy. Carotid stenosis measurements (when applicable) are obtained utilizing NASCET criteria, using the distal internal carotid diameter as the denominator. CONTRAST:  49mL OMNIPAQUE IOHEXOL 350 MG/ML SOLN COMPARISON:  Same day CT head. FINDINGS: CTA NECK FINDINGS Aortic arch: Great vessel origins are patent. Right carotid system: No evidence of dissection, stenosis (50% or greater) or occlusion. Mild apparent irregularity of the ICA at the skull base in a region of streak artifact. Left  carotid system: No evidence of dissection, stenosis (50% or greater) or occlusion. Mild apparent irregularity of the ICA at the skull base in a region of streak artifact. Vertebral arteries: Codominant. No evidence of dissection, stenosis (50% or greater) or occlusion. Skeleton: Moderate degenerative disc disease at at C5-C6 and C6-C7 with disc height loss, endplate sclerosis and posterior disc osteophyte complexes. Other neck: No acute abnormality Upper chest: Visualized lung apices are clear. Review of the MIP images confirms the above findings CTA HEAD FINDINGS Anterior circulation: No large vessel occlusion or proximal hemodynamically significant stenosis. Evaluation of the distal vasculature is limited due to venous contamination. No evidence of and arteriovenous malformation or aneurysm in the region of intraparenchymal hemorrhage, although acute blood products limit evaluation. Posterior circulation: No large vessel  occlusion or proximal hemodynamically significant stenosis. Venous sinuses: The superior sagittal sinus is narrowed in the region of hemorrhage, most likely from mass effect. Small right transverse and sigmoid sinuses. Review of the MIP images confirms the above findings IMPRESSION: CTA Head: 1. No large vessel occlusion or hemodynamically significant proximal arterial stenosis. 2. The superior sagittal sinus is narrowed in the region of hemorrhage with poorly visualized draining cortical veins in this region. While indeterminate, this may be secondary to mass effect given no definite/clear intraluminal thrombus. Given these findings and the location of hemorrhage; however, recommend low threshold for follow-up CTV or MRI with contrast to ensure stability and exclude worsening thrombosis. 3. No evidence of and arteriovenous malformation or aneurysm in the region of intraparenchymal hemorrhage, although acute blood products limit evaluation. Follow-up imaging after resolution of hemorrhage could  provide further assessment if clinically indicated. CTA Neck: 1. No significant (greater than 50%) stenosis. 2. Mild apparent irregularity of bilateral ICAs at the skull base is favored artifactual given streak artifact from dental amalgam in this region. Fibromuscular dysplasia is a differential consideration. Findings and recommendations discussed with Dr. Iver Nestle via telephone at 9:59 a.m. Electronically Signed   By: Feliberto Harts MD   On: 05/11/2021 10:09     PHYSICAL EXAM  Temp:  [97.8 F (36.6 C)-102.4 F (39.1 C)] 98.1 F (36.7 C) (06/15 0800) Pulse Rate:  [94-117] 101 (06/15 1000) Resp:  [14-27] 14 (06/15 1000) BP: (117-158)/(65-91) 131/73 (06/15 1000) SpO2:  [96 %-100 %] 99 % (06/15 1000) Weight:  [66 kg] 66 kg (06/15 0359)  General - Well nourished, well developed, sleepy drowsy.  Ophthalmologic - fundi not visualized due to noncooperation.  Cardiovascular - Regular rhythm and mild tachycardia.  Neuro - sleepy drowsy with eyes closed, with repetitive stimulation, he was able to follow peripheral commands on the right, but not central commands. Severe dysarthria, intangible sounds, not meaningful sentences. Actively against forces eye opening, but right gaze preference, not cross midline. Not blinking to visual threat bilaterally. Left facial droop. Tongue protrusion not cooperative. Right UE spontaneous movement against gravity and RLE some spontaneous movement but not against gravity, LUE and LLE plegic with increased muscle tone. Sensation, coordination not cooperative and gait not tested.   ASSESSMENT/PLAN Donald [name] is a 65 y.o. male with history of dementia with behavioral disturbance and OSA on CPAP admitted for right gaze and left hemoplegia, left facial droop. 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 He was stabilized and sent to CIR. 6/12 found to be lethargic and drowsy, CT repeat showed increased cerebral edema and midline shift. EEG no seizure s/p ativan  and keppra. He was transfer back to ICU for further management.   Encephalopathy  Improving Likely combination of cerebral edema, ? Seiziure, fever, leukocytosis and AKI Treat underlying condition as below Neuro check closely  Cerebral edema  CT head 6/12 showed progression of right frontal ICH and cerebral edema with midline shift CTA head and neck 6/12 no discrete AVM or spot sign CT repeat in am On 3% saline 50->65->30 cc/h->off Na Q6h Na 135->140->144->151->158->155 Na goal 145-155  ? 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 No CSVT on venous phase CT repeat mildly increased left frontal ICH 05/12/21 MRI brain no significant change of ICH since last CT MRV Non dominant right transverse and sigmoid dural venous sinuses. CT repeat in am Cerebral angiogram pending 2D Echo  EF 65-70% LDL 140 HgbA1c 5.8 UDS neg Heparin subq for VTE prophylaxis No antithrombotic prior to admission, now on No antithrombotic given ICH Ongoing aggressive stroke risk factor management Therapy recommendations:  pending Disposition:  Pending   ?? 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  Dementia  Has appointment with [facility] neurology for LP and evaluation of dementia, but pt not able to go due  to onset of ICH Delirium precaution Continue outpt follow up with [facility] neurolog[facility]  Hyperlipidemia Home meds:  none  LDL 140, goal < 70 Will consider low dose statin (lipitor 20) on discharge   Dysphagia  Speech to follow NPO  S/p cortrak on TF @ 50  Fever and leukocytosis Tmax 101.4->102.6->100.9->102.4 Leukocytosis WBC 7.6->12.4 ->8.1->7.8->22.9->20.4->24.8 CCM on board Continue monitoring UA neg CXR Minimal left basilar atelectasis  Other Stroke Risk Factors Advanced age Obstructive sleep apnea, on CPAP at home  Other Active Problems AKI Cre 1.20->1.45->1.03->1.02 - on IVF and TF  Hospital day # 3  This patient is critically ill due to ICH, cerebral edema, fever, leukocytosis, possible seizure and at significant risk of neurological worsening, death form brain herniation, status epilepticus, spesis, renal failiure. This patient's care requires constant monitoring of vital signs, hemodynamics, respiratory and cardiac monitoring, review of multiple databases, neurological assessment, discussion with family, other specialists and medical decision making of high complexity. I spent 35 minutes of neurocritical care time in the care of this patient. I had long discussion with wife at bedside, updated pt current condition, treatment plan and potential prognosis, and answered all the questions. She expressed understanding and appreciation. I also discussed with Dr. Denese Killings.    Marvel Plan, MD PhD Stroke Neurology 05/26/2021 10:24 AM    To contact Stroke Continuity provider, please refer to [email or web address]. After hours, contact General Neurology

## 2021-05-26 NOTE — Progress Notes (Signed)
  PCCM Interval Note  Noted that BLE venous duplex positive for acute deep vein thrombosis involving the left common femoral vein.   Discussed with Dr. Roda Shutters with Neurology.  Felt that given IVC filter was better/ safer option than heparin gtt, no bolus given recent ICH.    IR consult placed.       Posey Boyer, ACNP [place] [facility] 05/26/2021, 5:26 PM

## 2021-05-26 NOTE — Progress Notes (Signed)
NAME:  Donald [name], MRN:  [id number], DOB:  27-Dec-1955, LOS: 3 ADMISSION DATE:  05/23/2021, CONSULTATION DATE:  05/26/2021 REFERRING MD:  Admitted from Rehab, CHIEF COMPLAINT:  altered mental status   History of Present Illness:  Donald [name] is a 65 year old gentleman with recent intracranial hemorrhage who presents from rehab with worsening mental status and seizures.  He initially presented May 31 with new onset left-sided hemiparesis, right gaze, and left mild facial droop.  He was found to have acute intracranial hemorrhage of the posterior right frontal lobe.  He was medically managed with blood pressure control and discharged to inpatient rehab.  He had just finished his first day of intensive rehab.  At rehab he was sitting up, eating with assistance, able to talk.  This morning he was being fed breakfast with assistance and his nursing tech noted difficulty swallowing, facial droop, decreased mentation.  Code stroke was called.  CT head shows enlargement of his existing right intraparenchymal hemorrhage, with worsening midline shift.    He remains in critical condition the 4N ICU.  Pertinent  Medical History  Hypertension Mild cognitive decline Intracranial hemorrhage  Significant Hospital Events: Including procedures, antibiotic start and stop dates in addition to other pertinent events   6/12 admitted from inpatient rehab for acute worsening of intracranial hemorrhage of the right frontal lobe 6/13 Hypertonic started. NIR cancelled due to neurostatus   Interim History / Subjective:  Tmax 102.4  X1 stool, +961ml admit, 1.9 L uop past 24, +164 past 24  Unable to obtain subjective evaluation due to patient status  Objective   Blood pressure (!) 141/88, pulse 94, temperature 97.8 F (36.6 C), temperature source Axillary, resp. rate 16, weight 66 kg, SpO2 97 %. On RA        Intake/Output Summary (Last 24 hours) at 05/26/2021 0755 Last data filed at 05/26/2021 0500 Gross per  24 hour  Intake 2113.98 ml  Output 1950 ml  Net 163.98 ml   Filed Weights   05/24/21 0500 05/25/21 0500 05/26/21 0359  Weight: 66.5 kg 66.5 kg 66 kg    Examination: General:  in bed, no acute distress, appears comfortable HEENT: MM pink/moist, icteric/anicteric, atraumatic Neuro: no eye opening, follows commands, inappropriate, PERRL 17mm, moves L extremities, no movement on right CV: S1S2, NSR on monitor, no m/r/g appreciated PULM:  clear in the upper lobes and in the lower lobes, trachea midline , chest expansion symmetric GI: soft, bsx4 active, nondistended Extremities: warm/dry, no pretibial edema, capillary refill greater/less than 3 seconds  Skin: no rashes or lesions   Labs/imaging that I havepersonally reviewed  (right click and "Reselect all SmartList Selections" daily)  BMP- NA 158>155 CBC- WBC 20.4>24.8 UA: + for nitrates Resolved Hospital Problem list     Assessment & Plan:   [name] is 65 year old gentleman with recent intracranial hemorrhage now with worsening mental status changes and rebleeding of his right frontal lobe hemorrhage  Intracranial hemorrhage midline shift of the right frontal lobe Cerebral Edema ?Seizure As seen on 6/12 CT head, edema on CT head. No seizures seen on EEG. NA 158>155. Neuro exam slightly diminished compared to 6/14.  -3% NS stopped in AM. Continue q4h NA monitoring. Goal 150-155. -Appreciate Neurology assiatance. Management and workup per neurology -NIR for angiogram pending for Thursday or Friday -Repeat CTH on 6/16 -Continue close neurological monitoring -SBP goal 110-140.  Dysphagia S/p ICH -Cortrack in place. -Appreciate SLP assistance  Leukocytosis Fevers Tmax 102.4 at 1600 on 6/14. WBC 20.4>24.6. UA +  for nitrates. Remains on RA. Remains Normotensive -Obtain UC, follow up culture -Obtain procal, follow up level -Obtain BLLE duplex, follow up -Discussed with Attending 3 day course of empiric ceftriaxone.  Holding on ABX at this time. -Continue to monitor fever/wbc curve -AM CBC to monitor leukocytosis  HLD -Per neurology start lipitor 20 on discharge.  Hyperglycemia BG 169-214 -Blood Glucose goal 140-180. -Continue SSI -If BG over 200 today, will add levermir 5 U bid  Dementia Was in process of workup for early onset dementia at [facility] -Follow up with [facility] post op.  Best practice (right click and "Reselect all SmartList Selections" daily)  Diet:  Tube Feed  Pain/Anxiety/Delirium protocol (if indicated): No VAP protocol (if indicated): Not indicated DVT prophylaxis: SCD GI prophylaxis: N/A Glucose control:  SSI Yes Central venous access:  N/A Arterial line:  N/A Foley:  N/A Mobility:  bed rest  PT consulted: Yes Last date of multidisciplinary goals of care discussion [5/13 at bedside with wife] Wife updated at bedside 6/15. Code Status:  full code Disposition: ICU   Critical care time: 30 minutes        Flora Lipps [facility] 05/26/2021 7:55 AM  Pager: see AMION  If no response to pager , please call critical care on call (see AMION) until 7pm After 7:00 pm call Elink

## 2021-05-26 NOTE — Progress Notes (Signed)
Referring Physician(s): Marvel Plan  Supervising Physician: Julieanne Cotton  Patient Status:  [facility] - In-pt  Chief Complaint: ICH  Subjective:  Patient laying in bed, eyes closed and mumbling. Does not follow commands or verbalize anything intelligible. Visitor at bedside who says he opened eyes briefly for her yesterday but "could still see the eyelids on the inside." Per RN he followed a few commands earlier this morning but still did not open his eyes, he was able to tell her his name and the year his was born but nothing else. He is being worked up for an infection currently.   Allergies: Sulfa antibiotics  Medications: Prior to Admission medications   Medication Sig Start Date End Date Taking? Authorizing Provider  amLODipine (NORVASC) 10 MG tablet Take 1 tablet (10 mg total) by mouth daily. 05/20/21   Pokhrel, Rebekah Chesterfield, MD  atorvastatin (LIPITOR) 20 MG tablet Take 1 tablet (20 mg total) by mouth daily. 05/19/21 05/19/22  Pokhrel, Rebekah Chesterfield, MD  hydrochlorothiazide (MICROZIDE) 12.5 MG capsule Take 1 capsule (12.5 mg total) by mouth daily. 05/20/21   Pokhrel, Rebekah Chesterfield, MD  Multiple Vitamins-Minerals (MENS 50+ MULTI VITAMIN/MIN PO) Take 1 Dose by mouth in the morning and at bedtime.    [provider]  pantoprazole (PROTONIX) 40 MG tablet Take 1 tablet (40 mg total) by mouth daily. 05/20/21   Pokhrel, Rebekah Chesterfield, MD  polyethylene glycol (MIRALAX / GLYCOLAX) 17 g packet Take 17 g by mouth 2 (two) times daily. 05/19/21   Pokhrel, Rebekah Chesterfield, MD  senna-docusate (SENOKOT-S) 8.6-50 MG tablet Take 1 tablet by mouth 2 (two) times daily. 05/19/21   Pokhrel, Rebekah Chesterfield, MD     Vital Signs: BP 121/66   Pulse 96   Temp 98.1 F (36.7 C) (Axillary)   Resp 17   Wt 145 lb 8.1 oz (66 kg)   SpO2 98%   BMI 24.21 kg/m   Physical Exam Vitals and nursing note reviewed.  Constitutional:      General: He is not in acute distress.    Appearance: He is ill-appearing.     Comments: Eyes closed, mumbling.  (+)  right hand mitten (+) left foot boot  HENT:     Head: Normocephalic.  Cardiovascular:     Rate and Rhythm: Normal rate.  Pulmonary:     Effort: Pulmonary effort is normal.  Abdominal:     Palpations: Abdomen is soft.     Comments: (+) NG in place  Genitourinary:    Comments: (+) foley draining clear yellow urine Somnolent, mumbling, does not verbalize anything intelligible on exam Does not follow commands PERRL - unable to assess, patient does not open eyes on command and actively holds eyelids close EOMs - not assessed. Visual fields - not assessed No obvious facial asymmetry. Tongue midline - unable to assess Motor power - unable to assess, does not follow commands Distal pulses palpable bilaterally   Imaging: CT HEAD WO CONTRAST  Result Date: 05/23/2021 CLINICAL DATA:  Follow-up intracerebral hemorrhage EXAM: CT HEAD WITHOUT CONTRAST TECHNIQUE: Contiguous axial images were obtained from the base of the skull through the vertex without intravenous contrast. COMPARISON:  CT 05/23/2021 FINDINGS: Brain: No significant interval change in the overall size of the intraparenchymal hemorrhagic component seen in the right frontal with some extensive surrounding hypo attenuating edematous changes. Locoregional mass effect with sulcal effacement and effacement of the right lateral ventricle as well as stable subfalcine herniation with a right to left midline shift of approximately 14 mm. Slight medialization of  the right uncus without clear transtentorial herniation is also unchanged from prior. Basal cisterns remain patent. No new sites of hemorrhage. No new loss of gray-white differentiation. Vascular: No hyperdense vessel or unexpected calcification. Skull: Normal. Negative for fracture or focal lesion. EEG leads in place. Sinuses/Orbits: Mild mural thickening in the maxillary sinuses. Paranasal sinuses and mastoid air cells are otherwise predominantly clear. Included orbital structures are  unremarkable. Other: None. IMPRESSION: 1. Stable appearance of the right frontal lobe intraparenchymal hemorrhage with surrounding cerebral edema, local mass effect as well as subfalcine midline shift of approximately 14 mm in slight medial ization of the right uncus, not significantly changed from comparison imaging. 2. No new sites of hemorrhage or new gray-white differentiation loss. Electronically Signed   By: Kreg Shropshire M.D.   On: 05/23/2021 23:58   CT HEAD WO CONTRAST  Result Date: 05/23/2021 CLINICAL DATA:  Follow-up right frontal parenchymal hemorrhage seen earlier today. EXAM: CT HEAD WITHOUT CONTRAST TECHNIQUE: Contiguous axial images were obtained from the base of the skull through the vertex without intravenous contrast. COMPARISON:  Earlier today. FINDINGS: Brain: No significant change in the previously described high right frontal lobe parenchymal hematoma with extensive surrounding edema and midline shift to the left. The midline shift currently measures 10 mm on image number 16/3. No interval ventricular enlargement. No new hemorrhage. Vascular: No hyperdense vessel or unexpected calcification. Skull: Normal. Negative for fracture or focal lesion. Sinuses/Orbits: Stable small left frontal sinus osteoma. Mild moderate left maxillary sinus mucosal thickening without significant change. Unremarkable orbits. Other: Bilateral concha bullosa. IMPRESSION: 1. Stable large high right frontal parenchymal hematoma with extensive associated edema and midline shift to the left. 2. Stable left maxillary chronic sinusitis. Electronically Signed   By: Beckie Salts M.D.   On: 05/23/2021 18:00   CT HEAD WO CONTRAST  Result Date: 05/23/2021 CLINICAL DATA:  Cerebral hemorrhage, decreased responsiveness EXAM: CT HEAD WITHOUT CONTRAST TECHNIQUE: Contiguous axial images were obtained from the base of the skull through the vertex without intravenous contrast. COMPARISON:  Earlier same day FINDINGS: Brain:  Parenchymal hematoma centered within the high right frontal lobe does not measure substantially changed from the prior study. Extensive surrounding edema is again noted with regional mass effect. Leftward midline shift measures similar at 14 mm at the level of the septum pellucidum. Ventricle caliber is unchanged with no new trapping. No significant central herniation. There is no new loss of gray-white differentiation. Intraventricular hemorrhage is identified. Vascular: No new findings. Skull: Unremarkable. Sinuses/Orbits: No acute finding. Other: None. IMPRESSION: No substantial change in right frontal parenchymal hemorrhage, edema, and mass effect including midline shift. No new ventricle trapping or acute infarction. Electronically Signed   By: Guadlupe Spanish M.D.   On: 05/23/2021 12:49   DG CHEST PORT 1 VIEW  Result Date: 05/24/2021 CLINICAL DATA:  Leukocytosis EXAM: PORTABLE CHEST 1 VIEW COMPARISON:  04/28/15 FINDINGS: Cardiac shadow is within normal limits. Mild aortic calcifications are noted. The lungs are well aerated bilaterally with minimal left basilar atelectasis. No bony abnormality is seen. IMPRESSION: Minimal left basilar atelectasis. Electronically Signed   By: Alcide Clever M.D.   On: 05/24/2021 10:52   DG Abd Portable 1V  Result Date: 05/24/2021 CLINICAL DATA:  Feeding tube placement EXAM: PORTABLE ABDOMEN - 1 VIEW COMPARISON:  05/20/2021 FINDINGS: Limited radiograph of the lower chest and upper abdomen was obtained for the purposes of enteric tube localization. Enteric tube is seen coursing below the diaphragm with distal tip terminating within the expected  location of the gastric body. Air-filled large and small bowel loops throughout the abdomen, similar to prior. IMPRESSION: Enteric tube tip terminates within the gastric body. Electronically Signed   By: Duanne Guess D.O.   On: 05/24/2021 13:50   EEG adult  Result Date: 05/23/2021 Charlsie Quest, MD     05/23/2021  4:11 PM  Patient Name: Donald [name] MRN: [id number] Epilepsy Attending: Charlsie Quest Referring Physician/Provider: Dr Bing Neighbors Date: 05/23/2021 Duration: 25.36 mins Patient history:  65 y.o. male currently admitted for ICH on 05/11/21 developed somnolence and aphasia with R gaze deviation that resolved with ativan. EEG to evaluate for seizure Level of alertness: asleep AEDs during EEG study: LEV Technical aspects: This EEG study was done with scalp electrodes positioned according to the 10-20 International system of electrode placement. Electrical activity was acquired at a sampling rate of 500Hz  and reviewed with a high frequency filter of 70Hz  and a low frequency filter of 1Hz . EEG data were recorded continuously and digitally stored. Description: No posterior dominant rhythm was seen. Sleep was characterized by sleep spindles (12 to 14 Hz), maximal frontocentral region. EEG showed continuous generalized 3 to 6 Hz theta-delta slowing. There is an excessive amount of 15 to 18 Hz, 2-3 uV beta activity distributed symmetrically and diffusely.  Hyperventilation and photic stimulation were not performed.   ABNORMALITY - Continuous slow, generalized - Excessive beta, generalized IMPRESSION: This study is suggestive of moderate to severe diffuse encephalopathy, nonspecific etiology. The excessive beta activity seen in the background is most likely due to the effect of benzodiazepine and is a benign EEG pattern. No seizures or epileptiform discharges were seen throughout the recording. Priyanka   Overnight EEG with video  Result Date: 05/24/2021 , MD     05/25/2021  9:42 AM Patient Name: Cobey Raineri MRN: Charlsie Quest Epilepsy Attending: 05/27/2021 Referring Physician/Provider: Dr Melanee Left Duration: 05/23/2021 1655 to 05/24/2021 1655  Patient history:  65 y.o. male currently admitted for ICH on 05/11/21 developed somnolence and aphasia with R gaze deviation that resolved with ativan.  EEG to evaluate for seizure  Level of alertness: awake, asleep  AEDs during EEG study: LEV  Technical aspects: This EEG study was done with scalp electrodes positioned according to the 10-20 International system of electrode placement. Electrical activity was acquired at a sampling rate of 500Hz  and reviewed with a high frequency filter of 70Hz  and a low frequency filter of 1Hz . EEG data were recorded continuously and digitally stored.  Description: No posterior dominant rhythm was seen. Sleep was characterized by sleep spindles (12 to 14 Hz), asymmetry ( R<L) maximal frontocentral region. EEG showed continuous generalized and lateralized right hemisphere 3 to 6 Hz theta-delta slowing. Hyperventilation and photic stimulation were not performed.    ABNORMALITY - Continuous slow, generalized and lateralized right hemisphere - Spindle asymmetry, right<left  IMPRESSION: This study is suggestive of cortical dysfunction in right hemisphere likely secondary to underlying structural abnormality.  Additionally there is moderate to severe diffuse encephalopathy, nonspecific etiology. No seizures or definite epileptiform discharges were seen throughout the recording.   05/26/2021   CT HEAD CODE STROKE WO CONTRAST  Result Date: 05/23/2021 CLINICAL DATA:  Code stroke.  Acute stroke. EXAM: CT HEAD WITHOUT CONTRAST TECHNIQUE: Contiguous axial images were obtained from the base of the skull through the vertex without intravenous contrast. COMPARISON:  Head CT from 05/11/2021 FINDINGS: Brain: 5.5 x 2.8 cm hematoma in the subcortical high right frontal  region with extensive vasogenic edema in the adjacent brain that is progressed. There is worsening local mass effect with midline shift now measuring 13 mm. No entrapment. No cytotoxic edema seen. Vascular: No hyperdense vessel or unexpected calcification. Skull: Normal. Negative for fracture or focal lesion. Sinuses/Orbits: Negative Other: Critical Value/emergent results  were called by telephone at the time of interpretation on 05/23/2021 at 9:10 am to provider [facility] , who verbally acknowledged these results. ASPECTS [facility]([place] Stroke Program Early CT Score) Not scored in this setting IMPRESSION: Progressed right frontal hematoma and vasogenic edema with midline shift now measuring 13 mm. Electronically Signed   By: Marnee SpringJonathon  Watts M.D.   On: 05/23/2021 09:12   CT ANGIO HEAD NECK W WO CM (CODE STROKE)  Result Date: 05/23/2021 CLINICAL DATA:  Enlarging hematoma EXAM: CT ANGIOGRAPHY HEAD AND NECK TECHNIQUE: Multidetector CT imaging of the head and neck was performed using the standard protocol during bolus administration of intravenous contrast. Multiplanar CT image reconstructions and MIPs were obtained to evaluate the vascular anatomy. Carotid stenosis measurements (when applicable) are obtained utilizing NASCET criteria, using the distal internal carotid diameter as the denominator. CONTRAST:  50mL OMNIPAQUE IOHEXOL 350 MG/ML SOLN COMPARISON:  CTA 05/11/2021 FINDINGS: CTA NECK FINDINGS Aortic arch: Normal Right carotid system: Widely patent vessels with smooth contour. No atheromatous changes Left carotid system: Widely patent vessels with smooth contour. No noted atheromatous changes Vertebral arteries: No proximal subclavian stenosis. The vertebral arteries are widely patent. No suspected vertebral beading when allowing for streak artifact. Skeleton: Multilevel degenerative disc narrowing and ridging canal and foraminal encroachment the lower cervical levels. Other neck: No evidence of inflammation or mass. Upper chest: Negative Review of the MIP images confirms the above findings CTA HEAD FINDINGS Anterior circulation: Major vessels are smooth and widely patent. No branch occlusion, generalized beading, or aneurysm. There is a cortical branch at the right frontal parietal convexity which is not continuous beyond the level of the upper and lateral hematoma. No visible  nidus or early enhancing veins. Posterior circulation: Vertebral and basilar arteries are smooth and widely patent. No branch occlusion, beading, or aneurysm. Venous sinuses: MRV was obtained previously. The veins are largely nonenhancing on this study. Anatomic variants: None significant Review of the MIP images confirms the above findings IMPRESSION: Seemingly truncated arterial cortical branch at the upper and lateral hematoma, without discrete vascular malformation or spot sign. Recommend follow-up after resolution of mass effect to evaluate for small AVM. Electronically Signed   By: Marnee SpringJonathon  Watts M.D.   On: 05/23/2021 09:54    Labs:  CBC: Recent Labs    05/21/21 0532 05/24/21 0134 05/25/21 0141 05/26/21 0335  WBC 8.7 22.9* 20.4* 24.8*  HGB 16.2 16.3 14.7 14.0  HCT 47.5 49.6 44.1 43.9  PLT 292 309 268 360    COAGS: Recent Labs    05/11/21 0851  INR 1.0  APTT 26    BMP: Recent Labs    09/11/20 1449 05/11/21 0851 05/21/21 0532 05/24/21 0134 05/24/21 [id number] 05/25/21 0141 05/25/21 0743 05/25/21 1429 05/25/21 2306 05/26/21 0335  NA 143   < > 134* 135   < > 144  145 151* 154* 156* 158*  K 4.3   < > 4.3 4.4  --  4.4  --   --   --  4.0  CL 103   < > 95* 96*  --  115*  --   --   --  121*  CO2 26   < > 29  27  --  23  --   --   --  28  GLUCOSE 96   < > 133* 135*  --  200*  --   --   --  168*  BUN 10   < > 20 27*  --  29*  --   --   --  23  CALCIUM 10.1   < > 10.0 10.1  --  9.2  --   --   --  9.7  CREATININE 1.17   < > 1.20 1.45*  --  1.03  --   --   --  1.02  GFRNONAA 66   < > >60 54*  --  >60  --   --   --  >60  GFRAA 76  --   --   --   --   --   --   --   --   --    < > = values in this interval not displayed.    LIVER FUNCTION TESTS: Recent Labs    09/11/20 1449 05/11/21 0851 05/12/21 0341 05/21/21 0532  BILITOT 0.5 0.7 0.8 0.9  AST 24 23 24 27   ALT 23 17 19 25   ALKPHOS 124* 94 108 100  PROT 7.6 6.7 7.5 7.7  ALBUMIN 4.8 3.9 4.3 4.0    Assessment and  Plan:  65 y/o M admitted for ICH - NIR consulted for possible diagnostic cerebral angiogram, however awaiting improvement in mentation prior to proceeding.  On exam today patient appears less alert than yesterday, not following commands, mumbling, still not opening eyes. Per RN earlier this AM he was able to follow simple commands and tell her his name/birth year.   Given mentation has not improved on exam today we will reassess the patient tomorrow AM for possible procedure, order placed for tube feeds to be held at midnight in the even we can proceed with diagnostic cerebral angiogram.  NIR will continue to follow, please call with questions or concerns.  Electronically Signed: , PA-C 05/26/2021, 9:22 AM   I spent a total of 15 Minutes at the the patient's bedside AND on the patient's hospital floor or unit, greater than 50% of which was counseling/coordinating care for diagnostic cerebral angiogram.

## 2021-05-26 NOTE — Progress Notes (Signed)
BLE venous duplex has been completed.  Preliminary findings given to Leotis Shames, RN.  Results can be found under chart review under CV PROC. 05/26/2021 4:54 PM Elliott Quade RVT, RDMS

## 2021-05-26 NOTE — Progress Notes (Signed)
Spoke with Dr. Vassie Loll, CCM about pts Na levels outside of 145-155 goal range.  Last Na 158, will stop 3% infusion until further Na labs obtained.

## 2021-05-27 ENCOUNTER — Inpatient Hospital Stay (HOSPITAL_COMMUNITY): Payer: No Typology Code available for payment source

## 2021-05-27 HISTORY — PX: IR IVC FILTER PLMT / S&I /IMG GUID/MOD SED: IMG701

## 2021-05-27 HISTORY — PX: IR US GUIDE VASC ACCESS RIGHT: IMG2390

## 2021-05-27 LAB — GLUCOSE, CAPILLARY
Glucose-Capillary: 83 mg/dL (ref 70–99)
Glucose-Capillary: 134 mg/dL — ABNORMAL HIGH (ref 70–99)
Glucose-Capillary: 108 mg/dL — ABNORMAL HIGH (ref 70–99)
Glucose-Capillary: 113 mg/dL — ABNORMAL HIGH (ref 70–99)
Glucose-Capillary: 106 mg/dL — ABNORMAL HIGH (ref 70–99)
Glucose-Capillary: 188 mg/dL — ABNORMAL HIGH (ref 70–99)

## 2021-05-27 LAB — CBC
HCT: 44.6 % (ref 39.0–52.0)
Hemoglobin: 14.3 g/dL (ref 13.0–17.0)
MCH: 29.9 pg (ref 26.0–34.0)
MCHC: 32.1 g/dL (ref 30.0–36.0)
MCV: 93.3 fL (ref 80.0–100.0)
Platelets: 425 10*3/uL — ABNORMAL HIGH (ref 150–400)
RBC: 4.78 MIL/uL (ref 4.22–5.81)
RDW: 14.6 % (ref 11.5–15.5)
WBC: 23.9 10*3/uL — ABNORMAL HIGH (ref 4.0–10.5)
nRBC: 0 % (ref 0.0–0.2)

## 2021-05-27 LAB — BASIC METABOLIC PANEL
Anion gap: 7 (ref 5–15)
BUN: 25 mg/dL — ABNORMAL HIGH (ref 8–23)
CO2: 30 mmol/L (ref 22–32)
Calcium: 9.8 mg/dL (ref 8.9–10.3)
Chloride: 119 mmol/L — ABNORMAL HIGH (ref 98–111)
Creatinine, Ser: 0.98 mg/dL (ref 0.61–1.24)
GFR, Estimated: >60 mL/min (ref >60)
Glucose, Bld: 93 mg/dL (ref 70–99)
Potassium: 4.2 mmol/L (ref 3.5–5.1)
Sodium: 156 mmol/L — ABNORMAL HIGH (ref 135–145)

## 2021-05-27 LAB — PROCALCITONIN: Procalcitonin: 0.97 ng/mL

## 2021-05-27 LAB — SODIUM: Sodium: 157 mmol/L — ABNORMAL HIGH (ref 135–145)

## 2021-05-27 MED ORDER — FENTANYL CITRATE (PF) 100 MCG/2ML IJ SOLN
INTRAMUSCULAR | Status: AC | PRN
  Administered 2021-05-27: 50 ug via INTRAVENOUS

## 2021-05-27 MED ORDER — SODIUM CHLORIDE 0.9 % IV SOLN: INTRAVENOUS | Status: DC

## 2021-05-27 MED ORDER — MIDAZOLAM HCL 2 MG/2ML IJ SOLN
INTRAMUSCULAR | Status: AC
  Filled 2021-05-27: qty 2

## 2021-05-27 MED ORDER — WHITE PETROLATUM EX OINT
TOPICAL_OINTMENT | CUTANEOUS | Status: AC
  Filled 2021-05-27: qty 28.35

## 2021-05-27 MED ORDER — MIDAZOLAM HCL 2 MG/2ML IJ SOLN
INTRAMUSCULAR | Status: AC | PRN
  Administered 2021-05-27: 1 mg via INTRAVENOUS

## 2021-05-27 MED ORDER — LIDOCAINE HCL (PF) 1 % IJ SOLN
INTRAMUSCULAR | Status: AC
  Administered 2021-05-27: 2 mL via SUBCUTANEOUS
  Filled 2021-05-27: qty 30

## 2021-05-27 MED ORDER — FENTANYL CITRATE (PF) 100 MCG/2ML IJ SOLN
INTRAMUSCULAR | Status: AC
  Filled 2021-05-27: qty 2

## 2021-05-27 MED ORDER — ENOXAPARIN SODIUM 40 MG/0.4ML IJ SOSY
40.0000 mg | PREFILLED_SYRINGE | INTRAMUSCULAR | Status: DC
  Administered 2021-05-27 – 2021-05-30 (×4): 40 mg via SUBCUTANEOUS
  Filled 2021-05-27 (×4): qty 0.4

## 2021-05-27 NOTE — Progress Notes (Signed)
NAME:  Donald [name], MRN:  [id number], DOB:  [birth year], LOS: 4 ADMISSION DATE:  05/23/2021, CONSULTATION DATE:  05/27/2021 REFERRING MD:  Admitted from Rehab, CHIEF COMPLAINT:  altered mental status   History of Present Illness:  Donald [name] is a 65 year old gentleman with recent intracranial hemorrhage who presents from rehab with worsening mental status and seizures.  He initially presented May 31 with new onset left-sided hemiparesis, right gaze, and left mild facial droop.  He was found to have acute intracranial hemorrhage of the posterior right frontal lobe.  He was medically managed with blood pressure control and discharged to inpatient rehab.  He had just finished his first day of intensive rehab.  At rehab he was sitting up, eating with assistance, able to talk.  This morning he was being fed breakfast with assistance and his nursing tech noted difficulty swallowing, facial droop, decreased mentation.  Code stroke was called.  CT head shows enlargement of his existing right intraparenchymal hemorrhage, with worsening midline shift.    He remains in critical condition the 4N ICU.  Pertinent  Medical History  Hypertension Mild cognitive decline Intracranial hemorrhage  Significant Hospital Events: Including procedures, antibiotic start and stop dates in addition to other pertinent events   6/12 admitted from inpatient rehab for acute worsening of intracranial hemorrhage of the right frontal lobe 6/13 Hypertonic started. NIR cancelled due to neurostatus 6/15 Improving mentation. DVT right leg. Started on empiric antibiotics.    Interim History / Subjective:  More alert again today.   Objective   Blood pressure 137/89, pulse 99, temperature 97.6 F (36.4 C), temperature source Axillary, resp. rate 13, weight 66 kg, SpO2 97 %. On RA        Intake/Output Summary (Last 24 hours) at 05/27/2021 1212 Last data filed at 05/27/2021 0547 Gross per 24 hour  Intake 700 ml  Output 1050  ml  Net -350 ml    Filed Weights   05/24/21 0500 05/25/21 0500 05/26/21 0359  Weight: 66.5 kg 66.5 kg 66 kg    Examination: General:  in bed, no acute distress, appears comfortable HEENT: MM pink/moist, icteric/anicteric, atraumatic Neuro: eyes open to voice., follows commands,  PERRL 84mm, moves L extremities, no movement on right CV: S1S2, NSR on monitor, no m/r/g appreciated PULM:  clear in the upper lobes and in the lower lobes, trachea midline , chest expansion symmetric GI: soft, bsx4 active, nondistended Extremities: warm/dry, no pretibial edema, capillary refill greater/less than 3 seconds  Skin: no rashes or lesions   Labs/imaging that I havepersonally reviewed  (right click and "Reselect all SmartList Selections" daily)  BMP- NA 158>155 CBC- WBC 20.4>24.8 UA: + for nitrates  CT head shows stable edema and expected evolution of hemorrhage.  Resolved Hospital Problem list     Assessment & Plan:   Donald [name] is 65 year old gentleman with recent intracranial hemorrhage now with worsening mental status changes and rebleeding of his right frontal lobe hemorrhage  Intracranial hemorrhage with associated cerebral edema causing midline shift Left hemiparesis and dysphagia Right femoral DVT  Leukocytosis with negative procalcitonin HLD Hyperglycemia Cognitive decline.   Plan:  - allow Na to drift down  - Follow for neurological recovery -IVC filter for DVT.   Best practice (right click and "Reselect all SmartList Selections" daily)  Diet:  Tube Feed  Pain/Anxiety/Delirium protocol (if indicated): No VAP protocol (if indicated): Not indicated DVT prophylaxis: SCD GI prophylaxis: N/A Glucose control:  SSI Yes Central venous access:  N/A Arterial line:  N/A Foley:  N/A Mobility:  bed rest  PT consulted: Yes Last date of multidisciplinary goals of care discussion [5/13 at bedside with wife] Wife updated at bedside 6/15. Code Status:  full code Disposition:  ICU   Critical care time: N/A        Lynnell Catalan [facility] 05/27/2021 12:12 PM  Pager: see AMION  If no response to pager , please call critical care on call (see AMION) until 7pm After 7:00 pm call Elink

## 2021-05-27 NOTE — Progress Notes (Signed)
STROKE TEAM PROGRESS NOTE   SUBJECTIVE (INTERVAL HISTORY) His wife is at the bedside, told me that when she came in this morning patient both eyes widely open.  During my rounding, patient lying in bed, seems more awake alert than yesterday, eyes easily open on voice, orientated to place and people, and age, but not to time.  Still has right gaze preference and left hemiplegia.  LE venous Doppler showed left femoral vein DVT, pending IVC filter today.  CT repeat stable unchanged.  OBJECTIVE Temp:  [97.6 F (36.4 C)-101.7 F (38.7 C)] 98.4 F (36.9 C) (06/16 1605) Pulse Rate:  [79-115] 102 (06/16 1800) Cardiac Rhythm: Sinus tachycardia (06/16 1605) Resp:  [10-22] 16 (06/16 1800) BP: (98-161)/(73-99) 128/99 (06/16 1800) SpO2:  [95 %-100 %] 97 % (06/16 1800)  Recent Labs  Lab 05/26/21 2330 05/27/21 0336 05/27/21 0712 05/27/21 1119 05/27/21 1634  GLUCAP 186* 83 134* 108* 113*   Recent Labs  Lab 05/21/21 0532 05/24/21 0134 05/24/21 1610 05/24/21 1604 05/24/21 1929 05/25/21 0141 05/25/21 0743 05/25/21 1429 05/25/21 2306 05/26/21 0335 05/26/21 0912 05/27/21 0300  NA 134* 135   < > 141   < > 144  145   < > 154* 156* 158* 155* 156*  K 4.3 4.4  --   --   --  4.4  --   --   --  4.0  --  4.2  CL 95* 96*  --   --   --  115*  --   --   --  121*  --  119*  CO2 29 27  --   --   --  23  --   --   --  28  --  30  GLUCOSE 133* 135*  --   --   --  200*  --   --   --  168*  --  93  BUN 20 27*  --   --   --  29*  --   --   --  23  --  25*  CREATININE 1.20 1.45*  --   --   --  1.03  --   --   --  1.02  --  0.98  CALCIUM 10.0 10.1  --   --   --  9.2  --   --   --  9.7  --  9.8  MG  --  2.5*  --  2.5*  --  2.6*  --  2.5*  --   --   --   --   PHOS  --  3.6  --  3.2  --  2.1*  --  1.9*  --   --   --   --    < > = values in this interval not displayed.   Recent Labs  Lab 05/21/21 0532  AST 27  ALT 25  ALKPHOS 100  BILITOT 0.9  PROT 7.7  ALBUMIN 4.0   Recent Labs  Lab 05/21/21 0532  05/24/21 0134 05/25/21 0141 05/26/21 0335 05/27/21 0300  WBC 8.7 22.9* 20.4* 24.8* 23.9*  NEUTROABS 5.4  --   --   --   --   HGB 16.2 16.3 14.7 14.0 14.3  HCT 47.5 49.6 44.1 43.9 44.6  MCV 88.3 90.0 90.9 94.4 93.3  PLT 292 309 268 360 425*   No results for input(s): CKTOTAL, CKMB, CKMBINDEX, TROPONINI in the last 168 hours. No results for input(s): LABPROT, INR in the last 72 hours. Recent  Labs    05/24/21 2224  COLORURINE YELLOW  LABSPEC 1.023  PHURINE 6.0  GLUCOSEU 50*  HGBUR MODERATE*  BILIRUBINUR NEGATIVE  KETONESUR NEGATIVE  PROTEINUR 30*  NITRITE POSITIVE*  LEUKOCYTESUR NEGATIVE       Component Value Date/Time   CHOL 232 (H) 05/11/2021 1118   CHOL 204 (H) 09/11/2020 1449   TRIG 73 05/11/2021 1118   HDL 77 05/11/2021 1118   HDL 63 09/11/2020 1449   CHOLHDL 3.0 05/11/2021 1118   VLDL 15 05/11/2021 1118   LDLCALC 140 (H) 05/11/2021 1118   LDLCALC 132 (H) 09/11/2020 1449   LDLCALC 107 (H) 04/29/2020 0826   Lab Results  Component Value Date   HGBA1C 5.8 (H) 05/11/2021      Component Value Date/Time   LABOPIA NONE DETECTED 05/11/2021 1109   COCAINSCRNUR NONE DETECTED 05/11/2021 1109   LABBENZ NONE DETECTED 05/11/2021 1109   AMPHETMU NONE DETECTED 05/11/2021 1109   THCU NONE DETECTED 05/11/2021 1109   LABBARB NONE DETECTED 05/11/2021 1109    No results for input(s): ETH in the last 168 hours.   I have personally reviewed the radiological images below and agree with the radiology interpretations.  DG Abd 1 View  Result Date: 05/20/2021 CLINICAL DATA:  Abdominal pain. EXAM: ABDOMEN - 1 VIEW COMPARISON:  None. FINDINGS: Large amount of intestinal gas throughout small and large bowel suggesting ileus. There does not appear to be a large amount of fecal matter. No focal obstruction is evident. No free air. No abnormal calcifications or significant bone findings. IMPRESSION: Large amount of intestinal gas suggesting ileus. Electronically Signed   By: Paulina Fusi  M.D.   On: 05/20/2021 19:25   CT HEAD WO CONTRAST  Result Date: 05/27/2021 CLINICAL DATA:  65 year old male with right hemisphere hemorrhage at code stroke presentation 05/11/2021. Subsequent encounter. EXAM: CT HEAD WITHOUT CONTRAST TECHNIQUE: Contiguous axial images were obtained from the base of the skull through the vertex without intravenous contrast. COMPARISON:  Head CT 05/23/2021 and earlier. FINDINGS: Brain: Evolving right superior frontal lobe hemorrhage. Slowly fading hyperdense and mixed density blood products involving the superior and middle frontal gyri in an area of up to 48 x 35 x 50 mm (AP by transverse by CC). Continued regional edema and mass effect including effaced right lateral ventricle and leftward midline shift up to 7 mm at the midportion of the septum pellucidum. No ventriculomegaly. No intraventricular or significant extra-axial hemorrhage. Basilar cisterns remain patent. Stable gray-white matter differentiation elsewhere. No acute cortically based infarct. Vascular: Mild Calcified atherosclerosis at the skull base. Skull: Stable, negative. Sinuses/Orbits: Mild to moderate maxillary alveolar recess mucosal thickening. Otherwise paranasal sinuses and mastoids are stable and well aerated. Other: Right nasoenteric tube in place. Visualized orbits and scalp soft tissues are within normal limits. IMPRESSION: 1. Slowly evolving right superior frontal lobe hemorrhage, not significantly changed in size or configuration since 05/23/2021, with stable regional edema and mass effect. 2. No new intracranial abnormality. Electronically Signed   By: Odessa Fleming M.D.   On: 05/27/2021 06:24   CT HEAD WO CONTRAST  Result Date: 05/23/2021 CLINICAL DATA:  Follow-up intracerebral hemorrhage EXAM: CT HEAD WITHOUT CONTRAST TECHNIQUE: Contiguous axial images were obtained from the base of the skull through the vertex without intravenous contrast. COMPARISON:  CT 05/23/2021 FINDINGS: Brain: No significant  interval change in the overall size of the intraparenchymal hemorrhagic component seen in the right frontal with some extensive surrounding hypo attenuating edematous changes. Locoregional mass effect with sulcal effacement  and effacement of the right lateral ventricle as well as stable subfalcine herniation with a right to left midline shift of approximately 14 mm. Slight medialization of the right uncus without clear transtentorial herniation is also unchanged from prior. Basal cisterns remain patent. No new sites of hemorrhage. No new loss of gray-white differentiation. Vascular: No hyperdense vessel or unexpected calcification. Skull: Normal. Negative for fracture or focal lesion. EEG leads in place. Sinuses/Orbits: Mild mural thickening in the maxillary sinuses. Paranasal sinuses and mastoid air cells are otherwise predominantly clear. Included orbital structures are unremarkable. Other: None. IMPRESSION: 1. Stable appearance of the right frontal lobe intraparenchymal hemorrhage with surrounding cerebral edema, local mass effect as well as subfalcine midline shift of approximately 14 mm in slight medial ization of the right uncus, not significantly changed from comparison imaging. 2. No new sites of hemorrhage or new gray-white differentiation loss. Electronically Signed   By: Kreg Shropshire M.D.   On: 05/23/2021 23:58   CT HEAD WO CONTRAST  Result Date: 05/23/2021 CLINICAL DATA:  Follow-up right frontal parenchymal hemorrhage seen earlier today. EXAM: CT HEAD WITHOUT CONTRAST TECHNIQUE: Contiguous axial images were obtained from the base of the skull through the vertex without intravenous contrast. COMPARISON:  Earlier today. FINDINGS: Brain: No significant change in the previously described high right frontal lobe parenchymal hematoma with extensive surrounding edema and midline shift to the left. The midline shift currently measures 10 mm on image number 16/3. No interval ventricular enlargement. No new  hemorrhage. Vascular: No hyperdense vessel or unexpected calcification. Skull: Normal. Negative for fracture or focal lesion. Sinuses/Orbits: Stable small left frontal sinus osteoma. Mild moderate left maxillary sinus mucosal thickening without significant change. Unremarkable orbits. Other: Bilateral concha bullosa. IMPRESSION: 1. Stable large high right frontal parenchymal hematoma with extensive associated edema and midline shift to the left. 2. Stable left maxillary chronic sinusitis. Electronically Signed   By: Beckie Salts M.D.   On: 05/23/2021 18:00   CT HEAD WO CONTRAST  Result Date: 05/23/2021 CLINICAL DATA:  Cerebral hemorrhage, decreased responsiveness EXAM: CT HEAD WITHOUT CONTRAST TECHNIQUE: Contiguous axial images were obtained from the base of the skull through the vertex without intravenous contrast. COMPARISON:  Earlier same day FINDINGS: Brain: Parenchymal hematoma centered within the high right frontal lobe does not measure substantially changed from the prior study. Extensive surrounding edema is again noted with regional mass effect. Leftward midline shift measures similar at 14 mm at the level of the septum pellucidum. Ventricle caliber is unchanged with no new trapping. No significant central herniation. There is no new loss of gray-white differentiation. Intraventricular hemorrhage is identified. Vascular: No new findings. Skull: Unremarkable. Sinuses/Orbits: No acute finding. Other: None. IMPRESSION: No substantial change in right frontal parenchymal hemorrhage, edema, and mass effect including midline shift. No new ventricle trapping or acute infarction. Electronically Signed   By: Guadlupe Spanish M.D.   On: 05/23/2021 12:49   CT HEAD WO CONTRAST  Result Date: 05/11/2021 CLINICAL DATA:  Stroke.  Intracranial hemorrhage follow-up. EXAM: CT HEAD WITHOUT CONTRAST TECHNIQUE: Contiguous axial images were obtained from the base of the skull through the vertex without intravenous contrast.  COMPARISON:  Same day head CT. FINDINGS: Brain: Mild interval increase in the size of the right frontal intraparenchymal hemorrhage, measuring 4.5 x 2.6 x 3.5 cm (previously 4.2 x 2.3 x 3.1 cm). Similar adjacent small volume subarachnoid hemorrhage. A small amount of subdural hemorrhage in this region is also not excluded. Surrounding edema is similar. Similar local mass effect  without midline shift. Basal cisterns are patent. No evidence of acute large vascular territory infarct elsewhere. Similar mild white matter hypodensities, likely chronic microvascular ischemic disease. No hydrocephalus. Vascular: No hyperdense vessel.  Calcific atherosclerosis. Skull: No acute fracture. Sinuses/Orbits: Small left frontal sinus osteoma. Mild right and moderate left maxillary sinus mucosal thickening. Unremarkable orbits. Other: No mastoid effusions. IMPRESSION: Mild interval increase in the size of the right frontal intraparenchymal hemorrhage, measuring 4.5 x 2.6 x 3.5 cm (previously 4.2 x 2.3 x 3.1 cm). Similar adjacent small volume hemorrhage. Electronically Signed   By: Feliberto Harts MD   On: 05/11/2021 17:05   MR BRAIN WO CONTRAST  Result Date: 05/12/2021 CLINICAL DATA:  65 year old male code stroke presentation with intra-axial right hemisphere hemorrhage. Hypertensive (194/104) on presentation. EXAM: MRI HEAD WITHOUT CONTRAST TECHNIQUE: Multiplanar, multiecho pulse sequences of the brain and surrounding structures were obtained without intravenous contrast. COMPARISON:  CT head CTA head and neck 05/11/2021 FINDINGS: Brain: Coronal T2 weighted imaging could not be obtained. And some of the exam is intermittently degraded by motion artifact despite repeated imaging attempts. T1 isointense intra-axial hemorrhage in the posterior right frontal lobe demonstrates a layering hematocrit level (series 13, image 19) and blood encompasses 43 by 38 x 33 mm (AP by transverse by CC) for an estimated volume of 26 mL. Regional  edema. Mild regional mass effect. Trace subarachnoid hemorrhage also suspected on axial FLAIR imaging. Additionally, on SWI there is also chronic hemosiderin in the right parietal lobe on series 19 image 34. But no other definite chronic blood products. Susceptibility related abnormal diffusion in the posterior right frontal lobe with no convincing larger area of restricted diffusion. No restricted diffusion elsewhere. No IVH or ventriculomegaly. Normal basilar cisterns. Negative pituitary and cervicomedullary junction. Wallace Cullens and white matter signal outside of the affected right hemisphere is largely normal for age with mild nonspecific white matter changes. Vascular: Major intracranial vascular flow voids are preserved. Skull and upper cervical spine: Visualized bone marrow signal is within normal limits. Grossly negative cervical spine. Sinuses/Orbits: Negative orbits. Mild to moderate maxillary sinus mucosal thickening. Other: Mastoids are clear. Grossly normal visible internal auditory structures. IMPRESSION: 1. Posterior right frontal lobe intra-axial hemorrhage with layering hematocrit level has not significantly changed (estimated blood volume of 26 mL). Surrounding edema and mild regional mass effect. Trace subarachnoid hemorrhage. 2. No larger underlying infarct is evident. But chronic hemosiderin in the right parietal lobe indicates a previous intra-axial hemorrhage in the region. 3. No other acute intracranial abnormality. Electronically Signed   By: Odessa Fleming M.D.   On: 05/12/2021 07:26   IR IVC FILTER PLMT / S&I Lenise Arena GUID/MOD SED  Result Date: 05/27/2021 INDICATION: 65 year old gentleman with lower extremity DVT and acute intracranial hemorrhage presents to IR for IVC filter placement EXAM: Retrievable IVC filter placement utilizing fluoroscopic and ultrasound guidance MEDICATIONS: None. ANESTHESIA/SEDATION: One mg IV Versed; 50 mcg IV Fentanyl Moderate Sedation Time:  12 minutes The patient was  continuously monitored during the procedure by the interventional radiology nurse under my direct supervision. FLUOROSCOPY TIME:  Fluoroscopy Time: 1 minutes 6 seconds (9 mGy). COMPLICATIONS: None immediate. PROCEDURE: Informed written consent was obtained from the patient after a thorough discussion of the procedural risks, benefits and alternatives. All questions were addressed. Maximal Sterile Barrier Technique was utilized including caps, mask, sterile gowns, sterile gloves, sterile drape, hand hygiene and skin antiseptic. A timeout was performed prior to the initiation of the procedure. Patient positioned supine on the angiography table. Right  neck prepped and draped in usual sterile fashion. All elements of maximal sterile barrier were utilized including, cap, mask, sterile gown, sterile gloves, large sterile drape, hand scrubbing and 2% Chlorhexidine for skin cleaning. The right internal jugular vein was evaluated with ultrasound and shown to be patent. A permanent ultrasound image was obtained and placed in the patient's medical record. Using sterile gel and a sterile probe cover, the right internal jugular vein was entered with a 21 ga needle during real time ultrasound guidance. 21 gauge needle exchanged for a transitional dilator set over 0.018 inch guidewire. Transitional dilator set exchanged for 10 fr sheath over 0.035 inch guidewire. Sheath placed to the infrarenal IVC and CO2 venogram performed to delineate the position of the renal veins. Retrievable Denali IVC filter deployed in the infrarenal location. Sheath removed and hemostasis achieved with manual compression. IMPRESSION: Retrievable Denali IVC filter placed. PLAN: This IVC filter is potentially retrievable. The patient will be assessed for filter retrieval by Interventional Radiology in approximately 8-12 weeks. Further recommendations regarding filter retrieval, continued surveillance or declaration of device permanence, will be made at  that time. Electronically Signed   By: Acquanetta Belling M.D.   On: 05/27/2021 16:38   IR US Guide Vasc Access Right  Result Date: 05/27/2021 INDICATION: 65 year old gentleman with lower extremity DVT and acute intracranial hemorrhage presents to IR for IVC filter placement EXAM: Retrievable IVC filter placement utilizing fluoroscopic and ultrasound guidance MEDICATIONS: None. ANESTHESIA/SEDATION: One mg IV Versed; 50 mcg IV Fentanyl Moderate Sedation Time:  12 minutes The patient was continuously monitored during the procedure by the interventional radiology nurse under my direct supervision. FLUOROSCOPY TIME:  Fluoroscopy Time: 1 minutes 6 seconds (9 mGy). COMPLICATIONS: None immediate. PROCEDURE: Informed written consent was obtained from the patient after a thorough discussion of the procedural risks, benefits and alternatives. All questions were addressed. Maximal Sterile Barrier Technique was utilized including caps, mask, sterile gowns, sterile gloves, sterile drape, hand hygiene and skin antiseptic. A timeout was performed prior to the initiation of the procedure. Patient positioned supine on the angiography table. Right neck prepped and draped in usual sterile fashion. All elements of maximal sterile barrier were utilized including, cap, mask, sterile gown, sterile gloves, large sterile drape, hand scrubbing and 2% Chlorhexidine for skin cleaning. The right internal jugular vein was evaluated with ultrasound and shown to be patent. A permanent ultrasound image was obtained and placed in the patient's medical record. Using sterile gel and a sterile probe cover, the right internal jugular vein was entered with a 21 ga needle during real time ultrasound guidance. 21 gauge needle exchanged for a transitional dilator set over 0.018 inch guidewire. Transitional dilator set exchanged for 10 fr sheath over 0.035 inch guidewire. Sheath placed to the infrarenal IVC and CO2 venogram performed to delineate the position  of the renal veins. Retrievable Denali IVC filter deployed in the infrarenal location. Sheath removed and hemostasis achieved with manual compression. IMPRESSION: Retrievable Denali IVC filter placed. PLAN: This IVC filter is potentially retrievable. The patient will be assessed for filter retrieval by Interventional Radiology in approximately 8-12 weeks. Further recommendations regarding filter retrieval, continued surveillance or declaration of device permanence, will be made at that time. Electronically Signed   By: Acquanetta Belling M.D.   On: 05/27/2021 16:38   DG CHEST PORT 1 VIEW  Result Date: 05/24/2021 CLINICAL DATA:  Leukocytosis EXAM: PORTABLE CHEST 1 VIEW COMPARISON:  04/28/15 FINDINGS: Cardiac shadow is within normal limits. Mild aortic calcifications are noted.  The lungs are well aerated bilaterally with minimal left basilar atelectasis. No bony abnormality is seen. IMPRESSION: Minimal left basilar atelectasis. Electronically Signed   By: Alcide Clever M.D.   On: 05/24/2021 10:52   DG Abd Portable 1V  Result Date: 05/24/2021 CLINICAL DATA:  Feeding tube placement EXAM: PORTABLE ABDOMEN - 1 VIEW COMPARISON:  05/20/2021 FINDINGS: Limited radiograph of the lower chest and upper abdomen was obtained for the purposes of enteric tube localization. Enteric tube is seen coursing below the diaphragm with distal tip terminating within the expected location of the gastric body. Air-filled large and small bowel loops throughout the abdomen, similar to prior. IMPRESSION: Enteric tube tip terminates within the gastric body. Electronically Signed   By: Duanne Guess D.O.   On: 05/24/2021 13:50   EEG adult  Result Date: 05/23/2021 Charlsie Quest, MD     05/23/2021  4:11 PM Patient Name: Chistian Kasler MRN: [id number] Epilepsy Attending: Charlsie Quest Referring Physician/Provider: Dr Bing Neighbors Date: 05/23/2021 Duration: 25.36 mins Patient history:  65 y.o. male currently admitted for ICH on 05/11/21  developed somnolence and aphasia with R gaze deviation that resolved with ativan. EEG to evaluate for seizure Level of alertness: asleep AEDs during EEG study: LEV Technical aspects: This EEG study was done with scalp electrodes positioned according to the 10-20 International system of electrode placement. Electrical activity was acquired at a sampling rate of 500Hz  and reviewed with a high frequency filter of 70Hz  and a low frequency filter of 1Hz . EEG data were recorded continuously and digitally stored. Description: No posterior dominant rhythm was seen. Sleep was characterized by sleep spindles (12 to 14 Hz), maximal frontocentral region. EEG showed continuous generalized 3 to 6 Hz theta-delta slowing. There is an excessive amount of 15 to 18 Hz, 2-3 uV beta activity distributed symmetrically and diffusely.  Hyperventilation and photic stimulation were not performed.   ABNORMALITY - Continuous slow, generalized - Excessive beta, generalized IMPRESSION: This study is suggestive of moderate to severe diffuse encephalopathy, nonspecific etiology. The excessive beta activity seen in the background is most likely due to the effect of benzodiazepine and is a benign EEG pattern. No seizures or epileptiform discharges were seen throughout the recording. Priyanka   Overnight EEG with video  Result Date: 05/24/2021 , MD     05/25/2021  9:42 AM Patient Name: Garland Hincapie MRN: Charlsie Quest Epilepsy Attending: 05/27/2021 Referring Physician/Provider: Dr Melanee Left Duration: 05/23/2021 1655 to 05/24/2021 1655  Patient history:  65 y.o. male currently admitted for ICH on 05/11/21 developed somnolence and aphasia with R gaze deviation that resolved with ativan. EEG to evaluate for seizure  Level of alertness: awake, asleep  AEDs during EEG study: LEV  Technical aspects: This EEG study was done with scalp electrodes positioned according to the 10-20 International system of electrode placement.  Electrical activity was acquired at a sampling rate of 500Hz  and reviewed with a high frequency filter of 70Hz  and a low frequency filter of 1Hz . EEG data were recorded continuously and digitally stored.  Description: No posterior dominant rhythm was seen. Sleep was characterized by sleep spindles (12 to 14 Hz), asymmetry ( R<L) maximal frontocentral region. EEG showed continuous generalized and lateralized right hemisphere 3 to 6 Hz theta-delta slowing. Hyperventilation and photic stimulation were not performed.    ABNORMALITY - Continuous slow, generalized and lateralized right hemisphere - Spindle asymmetry, right<left  IMPRESSION: This study is suggestive of cortical dysfunction in right hemisphere likely  secondary to underlying structural abnormality.  Additionally there is moderate to severe diffuse encephalopathy, nonspecific etiology. No seizures or definite epileptiform discharges were seen throughout the recording.   Charlsie Quest   MR MRV HEAD W WO CONTRAST  Result Date: 05/12/2021 CLINICAL DATA:  Intracranial hemorrhage. EXAM: MR VENOGRAM HEAD WITHOUT AND WITH CONTRAST TECHNIQUE: Angiographic images of the intracranial venous structures were acquired using MRV technique without and with intravenous contrast. CONTRAST:  7mL GADAVIST GADOBUTROL 1 MMOL/ML IV SOLN COMPARISON:  Brain MRI performed earlier today 05/12/2021. Noncontrast head CT examinations 05/11/2021. CT angiogram head/neck 05/11/2021. FINDINGS: The superior sagittal sinus, internal cerebral veins, vein of Galen, straight sinus, transverse sinuses, sigmoid sinuses and visualized jugular veins are patent. No appreciable intracranial venous thrombosis. Hypoplastic right transverse and sigmoid dural venous sinuses. An acute parenchymal hemorrhage within the posterior right frontal lobe is grossly unchanged in size as compared to the brain MRI performed earlier today. There is mild curvilinear vascular enhancement overlying the site of  hemorrhage, which may be reactive. Elsewhere, no abnormal intracranial enhancement is identified. IMPRESSION: No evidence of intracranial venous thrombosis. Non dominant right transverse and sigmoid dural venous sinuses. Electronically Signed   By: Jackey Loge DO   On: 05/12/2021 11:27   ECHOCARDIOGRAM COMPLETE  Result Date: 05/11/2021    ECHOCARDIOGRAM REPORT   Patient Name:   [facility] Date of Exam: 05/11/2021 Medical Rec #:  [id number]        Height:       68.0 in Accession #:    [id number]       Weight:       155.2 lb Date of Birth:  [birth year]         BSA:          1.835 m Patient Age:    65 years         BP:           115/77 mmHg Patient Gender: M                HR:           84 bpm. Exam Location:  Inpatient Procedure: 2D Echo, Cardiac Doppler and Color Doppler Indications:    Stroke I63.9  History:        Patient has no prior history of Echocardiogram examinations.  Sonographer:    Roosvelt Maser RDCS Referring Phys: [id number] Reyne Dumas [facility] IMPRESSIONS  1. Left ventricular ejection fraction, by estimation, is 65 to 70%. The left ventricle has normal function. The left ventricle has no regional wall motion abnormalities. Left ventricular diastolic parameters were normal.  2. Right ventricular systolic function is normal. The right ventricular size is normal. Tricuspid regurgitation signal is inadequate for assessing PA pressure.  3. The mitral valve is normal in structure. No evidence of mitral valve regurgitation.  4. The aortic valve was not well visualized. Aortic valve regurgitation is not visualized. No aortic stenosis is present.  5. The inferior vena cava is normal in size with greater than 50% respiratory variability, suggesting right atrial pressure of 3 mmHg. FINDINGS  Left Ventricle: Left ventricular ejection fraction, by estimation, is 65 to 70%. The left ventricle has normal function. The left ventricle has no regional wall motion abnormalities. The left ventricular internal cavity size  was normal in size. There is  no left ventricular hypertrophy. Left ventricular diastolic parameters were normal. Right Ventricle: The right ventricular size is normal. No increase in right ventricular wall thickness. Right ventricular systolic  function is normal. Tricuspid regurgitation signal is inadequate for assessing PA pressure. Left Atrium: Left atrial size was normal in size. Right Atrium: Right atrial size was normal in size. Pericardium: There is no evidence of pericardial effusion. Mitral Valve: The mitral valve is normal in structure. No evidence of mitral valve regurgitation. Tricuspid Valve: The tricuspid valve is normal in structure. Tricuspid valve regurgitation is not demonstrated. Aortic Valve: The aortic valve was not well visualized. Aortic valve regurgitation is not visualized. No aortic stenosis is present. Aortic valve mean gradient measures 3.0 mmHg. Aortic valve peak gradient measures 6.7 mmHg. Aortic valve area, by VTI measures 2.77 cm. Pulmonic Valve: The pulmonic valve was not well visualized. Pulmonic valve regurgitation is not visualized. Aorta: The aortic root and ascending aorta are structurally normal, with no evidence of dilitation. Venous: The inferior vena cava is normal in size with greater than 50% respiratory variability, suggesting right atrial pressure of 3 mmHg. IAS/Shunts: The interatrial septum was not well visualized.  LEFT VENTRICLE PLAX 2D LVIDd:         4.90 cm  Diastology LVIDs:         2.80 cm  LV e' medial:    8.38 cm/s LV PW:         0.80 cm  LV E/e' medial:  9.0 LV IVS:        0.90 cm  LV e' lateral:   9.14 cm/s LVOT diam:     1.90 cm  LV E/e' lateral: 8.2 LV SV:         69 LV SV Index:   37 LVOT Area:     2.84 cm  RIGHT VENTRICLE RV Basal diam:  2.90 cm LEFT ATRIUM           Index       RIGHT ATRIUM           Index LA diam:      3.10 cm 1.69 cm/m  RA Area:     11.20 cm LA Vol (A2C): 26.8 ml 14.61 ml/m RA Volume:   21.90 ml  11.94 ml/m LA Vol (A4C): 39.2 ml  21.36 ml/m  AORTIC VALVE AV Area (Vmax):    2.64 cm AV Area (Vmean):   2.59 cm AV Area (VTI):     2.77 cm AV Vmax:           129.00 cm/s AV Vmean:          86.700 cm/s AV VTI:            0.248 m AV Peak Grad:      6.7 mmHg AV Mean Grad:      3.0 mmHg LVOT Vmax:         120.00 cm/s LVOT Vmean:        79.200 cm/s LVOT VTI:          0.242 m LVOT/AV VTI ratio: 0.98  AORTA Ao Root diam: 2.50 cm Ao Asc diam:  3.30 cm MITRAL VALVE MV Area (PHT): 3.60 cm     SHUNTS MV Decel Time: 211 msec     Systemic VTI:  0.24 m MV E velocity: 75.30 cm/s   Systemic Diam: 1.90 cm MV A velocity: 107.00 cm/s MV E/A ratio:  0.70 Epifanio Lescheshristopher Schumann MD Electronically signed by Epifanio Lescheshristopher Schumann MD Signature Date/Time: 05/11/2021/5:28:15 PM    Final    CT HEAD CODE STROKE WO CONTRAST  Result Date: 05/23/2021 CLINICAL DATA:  Code stroke.  Acute stroke. EXAM: CT HEAD WITHOUT CONTRAST  TECHNIQUE: Contiguous axial images were obtained from the base of the skull through the vertex without intravenous contrast. COMPARISON:  Head CT from 05/11/2021 FINDINGS: Brain: 5.5 x 2.8 cm hematoma in the subcortical high right frontal region with extensive vasogenic edema in the adjacent brain that is progressed. There is worsening local mass effect with midline shift now measuring 13 mm. No entrapment. No cytotoxic edema seen. Vascular: No hyperdense vessel or unexpected calcification. Skull: Normal. Negative for fracture or focal lesion. Sinuses/Orbits: Negative Other: Critical Value/emergent results were called by telephone at the time of interpretation on 05/23/2021 at 9:10 am to provider [facility] , who verbally acknowledged these results. ASPECTS [facility] Stroke Program Early CT Score) Not scored in this setting IMPRESSION: Progressed right frontal hematoma and vasogenic edema with midline shift now measuring 13 mm. Electronically Signed   By: Marnee Spring M.D.   On: 05/23/2021 09:12   CT HEAD CODE STROKE WO CONTRAST  Result Date:  05/11/2021 CLINICAL DATA:  Code stroke. Left-sided weakness, nausea, and vomiting. EXAM: CT HEAD WITHOUT CONTRAST TECHNIQUE: Contiguous axial images were obtained from the base of the skull through the vertex without intravenous contrast. COMPARISON:  None. FINDINGS: Brain: An acute parenchymal hemorrhage in the high posterior right frontal lobe measures 4.2 x 2.3 x 3.1 cm (approximate volume of 15 mL). There is mild surrounding edema without midline shift, and there is a small amount of adjacent subarachnoid hemorrhage. A very small amount of coexistent subdural hemorrhage is also not excluded in this region. No acute infarct is identified elsewhere. The ventricles are normal in size. Hypodensities in the cerebral white matter bilaterally are nonspecific but compatible with mild chronic small vessel ischemic disease. Vascular: No hyperdense vessel. Skull: No fracture or suspicious osseous lesion. Sinuses/Orbits: Small osteoma in the left frontal sinus. Mild right and moderate left maxillary sinus mucosal thickening. Clear mastoid air cells. Unremarkable orbits. Other: None. ASPECTS [facility] Stroke Program Early CT Score) Not scored in the presence of acute hemorrhage. IMPRESSION: Acute parenchymal hemorrhage in the posterior right frontal lobe with small amount of adjacent subarachnoid hemorrhage. These results were communicated to Dr. Iver Nestle at 9:08 am on 05/11/2021 by text page via the [facility] messaging system. Electronically Signed   By: Sebastian Ache M.D.   On: 05/11/2021 09:12   VAS [place] LOWER EXTREMITY VENOUS (DVT)  Result Date: 05/26/2021  Lower Venous DVT Study Patient Name:  CORGAN MORMILE  Date of Exam:   05/26/2021 Medical Rec #: [id number]         Accession #:    [id number] Date of Birth: November 29, 1956          Patient Gender: M Patient Age:   65Y Exam Location:  [facility] Procedure:      VAS [place] LOWER EXTREMITY VENOUS (DVT) Referring Phys: [id number] Eliezer Champagne  --------------------------------------------------------------------------------  Indications: Fever.  Limitations: Poor ultrasound/tissue interface. Comparison Study: No previous exams. Performing Technologist: Ernestene Mention RVT, RDMS  Examination Guidelines: A complete evaluation includes B-mode imaging, spectral Doppler, color Doppler, and power Doppler as needed of all accessible portions of each vessel. Bilateral testing is considered an integral part of a complete examination. Limited examinations for reoccurring indications may be performed as noted. The reflux portion of the exam is performed with the patient in reverse Trendelenburg.  +---------+---------------+---------+-----------+----------+--------------+ RIGHT    CompressibilityPhasicitySpontaneityPropertiesThrombus Aging +---------+---------------+---------+-----------+----------+--------------+ CFV      Full           Yes      Yes                                 +---------+---------------+---------+-----------+----------+--------------+  SFJ      Full                                                        +---------+---------------+---------+-----------+----------+--------------+ FV Prox  Full           Yes      Yes                                 +---------+---------------+---------+-----------+----------+--------------+ FV Mid   Full           Yes      Yes                                 +---------+---------------+---------+-----------+----------+--------------+ FV DistalFull           Yes      Yes                                 +---------+---------------+---------+-----------+----------+--------------+ PFV      Full                                                        +---------+---------------+---------+-----------+----------+--------------+ POP      Full           Yes      Yes                                 +---------+---------------+---------+-----------+----------+--------------+ PTV       Full                                                        +---------+---------------+---------+-----------+----------+--------------+ PERO     Full                                                        +---------+---------------+---------+-----------+----------+--------------+   +---------+---------------+---------+-----------+----------+-------------------+ LEFT     CompressibilityPhasicitySpontaneityPropertiesThrombus Aging      +---------+---------------+---------+-----------+----------+-------------------+ CFV      Partial        Yes      Yes                  Acute- non                                                                occlusive           +---------+---------------+---------+-----------+----------+-------------------+  SFJ      Full                                                             +---------+---------------+---------+-----------+----------+-------------------+ FV Prox  Full           Yes      Yes                                      +---------+---------------+---------+-----------+----------+-------------------+ FV Mid   Full           Yes      Yes                                      +---------+---------------+---------+-----------+----------+-------------------+ FV DistalFull           Yes      Yes                                      +---------+---------------+---------+-----------+----------+-------------------+ PFV      Full                                                             +---------+---------------+---------+-----------+----------+-------------------+ POP      Full           Yes      Yes                                      +---------+---------------+---------+-----------+----------+-------------------+ PTV                                                   Not well visualized +---------+---------------+---------+-----------+----------+-------------------+ PERO     Full                                                              +---------+---------------+---------+-----------+----------+-------------------+ EIV                     Yes      Yes                  Patent by color and  doppler             +---------+---------------+---------+-----------+----------+-------------------+     Summary: BILATERAL: - No evidence of superficial venous thrombosis in the lower extremities, bilaterally. -No evidence of popliteal cyst, bilaterally. RIGHT: - There is no evidence of deep vein thrombosis in the lower extremity.  LEFT: - Findings consistent with acute deep vein thrombosis involving the left common femoral vein.  *See table(s) above for measurements and observations. Electronically signed by Coral Else MD on 05/26/2021 at 7:50:00 PM.    Final    CT ANGIO HEAD NECK W WO CM (CODE STROKE)  Result Date: 05/23/2021 CLINICAL DATA:  Enlarging hematoma EXAM: CT ANGIOGRAPHY HEAD AND NECK TECHNIQUE: Multidetector CT imaging of the head and neck was performed using the standard protocol during bolus administration of intravenous contrast. Multiplanar CT image reconstructions and MIPs were obtained to evaluate the vascular anatomy. Carotid stenosis measurements (when applicable) are obtained utilizing NASCET criteria, using the distal internal carotid diameter as the denominator. CONTRAST:  50mL OMNIPAQUE IOHEXOL 350 MG/ML SOLN COMPARISON:  CTA 05/11/2021 FINDINGS: CTA NECK FINDINGS Aortic arch: Normal Right carotid system: Widely patent vessels with smooth contour. No atheromatous changes Left carotid system: Widely patent vessels with smooth contour. No noted atheromatous changes Vertebral arteries: No proximal subclavian stenosis. The vertebral arteries are widely patent. No suspected vertebral beading when allowing for streak artifact. Skeleton: Multilevel degenerative disc narrowing and ridging canal and foraminal encroachment the  lower cervical levels. Other neck: No evidence of inflammation or mass. Upper chest: Negative Review of the MIP images confirms the above findings CTA HEAD FINDINGS Anterior circulation: Major vessels are smooth and widely patent. No branch occlusion, generalized beading, or aneurysm. There is a cortical branch at the right frontal parietal convexity which is not continuous beyond the level of the upper and lateral hematoma. No visible nidus or early enhancing veins. Posterior circulation: Vertebral and basilar arteries are smooth and widely patent. No branch occlusion, beading, or aneurysm. Venous sinuses: MRV was obtained previously. The veins are largely nonenhancing on this study. Anatomic variants: None significant Review of the MIP images confirms the above findings IMPRESSION: Seemingly truncated arterial cortical branch at the upper and lateral hematoma, without discrete vascular malformation or spot sign. Recommend follow-up after resolution of mass effect to evaluate for small AVM. Electronically Signed   By: Marnee Spring M.D.   On: 05/23/2021 09:54   CT ANGIO HEAD NECK W WO CM (CODE STROKE)  Result Date: 05/11/2021 CLINICAL DATA:  Stroke follow-up.  Left-sided weakness. EXAM: CT ANGIOGRAPHY HEAD AND NECK TECHNIQUE: Multidetector CT imaging of the head and neck was performed using the standard protocol during bolus administration of intravenous contrast. Multiplanar CT image reconstructions and MIPs were obtained to evaluate the vascular anatomy. Carotid stenosis measurements (when applicable) are obtained utilizing NASCET criteria, using the distal internal carotid diameter as the denominator. CONTRAST:  50mL OMNIPAQUE IOHEXOL 350 MG/ML SOLN COMPARISON:  Same day CT head. FINDINGS: CTA NECK FINDINGS Aortic arch: Great vessel origins are patent. Right carotid system: No evidence of dissection, stenosis (50% or greater) or occlusion. Mild apparent irregularity of the ICA at the skull base in a region  of streak artifact. Left carotid system: No evidence of dissection, stenosis (50% or greater) or occlusion. Mild apparent irregularity of the ICA at the skull base in a region of streak artifact. Vertebral arteries: Codominant. No evidence of dissection, stenosis (50% or greater) or occlusion. Skeleton: Moderate degenerative disc disease at at C5-C6 and C6-C7  with disc height loss, endplate sclerosis and posterior disc osteophyte complexes. Other neck: No acute abnormality Upper chest: Visualized lung apices are clear. Review of the MIP images confirms the above findings CTA HEAD FINDINGS Anterior circulation: No large vessel occlusion or proximal hemodynamically significant stenosis. Evaluation of the distal vasculature is limited due to venous contamination. No evidence of and arteriovenous malformation or aneurysm in the region of intraparenchymal hemorrhage, although acute blood products limit evaluation. Posterior circulation: No large vessel occlusion or proximal hemodynamically significant stenosis. Venous sinuses: The superior sagittal sinus is narrowed in the region of hemorrhage, most likely from mass effect. Small right transverse and sigmoid sinuses. Review of the MIP images confirms the above findings IMPRESSION: CTA Head: 1. No large vessel occlusion or hemodynamically significant proximal arterial stenosis. 2. The superior sagittal sinus is narrowed in the region of hemorrhage with poorly visualized draining cortical veins in this region. While indeterminate, this may be secondary to mass effect given no definite/clear intraluminal thrombus. Given these findings and the location of hemorrhage; however, recommend low threshold for follow-up CTV or MRI with contrast to ensure stability and exclude worsening thrombosis. 3. No evidence of and arteriovenous malformation or aneurysm in the region of intraparenchymal hemorrhage, although acute blood products limit evaluation. Follow-up imaging after  resolution of hemorrhage could provide further assessment if clinically indicated. CTA Neck: 1. No significant (greater than 50%) stenosis. 2. Mild apparent irregularity of bilateral ICAs at the skull base is favored artifactual given streak artifact from dental amalgam in this region. Fibromuscular dysplasia is a differential consideration. Findings and recommendations discussed with Dr. Iver Nestle via telephone at 9:59 a.m. Electronically Signed   By: Feliberto Harts MD   On: 05/11/2021 10:09     PHYSICAL EXAM  Temp:  [97.6 F (36.4 C)-101.7 F (38.7 C)] 98.4 F (36.9 C) (06/16 1605) Pulse Rate:  [79-115] 102 (06/16 1800) Resp:  [10-22] 16 (06/16 1800) BP: (98-161)/(73-99) 128/99 (06/16 1800) SpO2:  [95 %-100 %] 97 % (06/16 1800)  General - Well nourished, well developed, sleepy drowsy.  Ophthalmologic - fundi not visualized due to noncooperation.  Cardiovascular - Regular rhythm and mild tachycardia.  Neuro - sleepy drowsy with eyes closed, with repetitive stimulation, he was able to follow peripheral commands on the right, but not central commands. Severe dysarthria, intangible sounds, not meaningful sentences. Actively against forces eye opening, but right gaze preference, not cross midline. Not blinking to visual threat bilaterally. Left facial droop. Tongue protrusion not cooperative. Right UE spontaneous movement against gravity and RLE some spontaneous movement but not against gravity, LUE and LLE plegic with increased muscle tone. Sensation, coordination not cooperative and gait not tested.   ASSESSMENT/PLAN Mr. Ayoub Arey is a 65 y.o. male with history of dementia with behavioral disturbance and OSA on CPAP admitted for right gaze and left hemoplegia, left facial droop. 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 He was stabilized and sent to CIR. 6/12 found to be lethargic and drowsy, CT repeat showed increased cerebral edema and midline shift. EEG no seizure s/p ativan and  keppra. He was transfer back to ICU for further management.   Encephalopathy  Improving Likely combination of cerebral edema, ? Seiziure, fever, leukocytosis and AKI Treat underlying condition as below Neuro check closely  Cerebral edema  CT head 6/12 showed progression of right frontal ICH and cerebral edema with midline shift CTA head and neck 6/12 no discrete AVM or spot sign CT repeat 6/16 unchanged, no progression of midline  shift or hematoma. On 3% saline 50->65->30 cc/h->off Na Q6h Na 135->140->144->151->158->155->156 Na goal 145-155  ? 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 No CSVT on venous phase CT repeat mildly increased left frontal ICH 05/12/21 MRI brain no significant change of ICH since last CT MRV Non dominant right transverse and sigmoid dural venous sinuses. CT repeat 6/16 unchanged, no progression of midline shift or hematoma. Cerebral angiogram pending 2D Echo  EF 65-70% LDL 140 HgbA1c 5.8 UDS neg Heparin subq for VTE prophylaxis No antithrombotic prior to admission, now on No antithrombotic given ICH Ongoing aggressive stroke risk factor management Therapy recommendations:  pending Disposition:  Pending   ?? 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 Lobar ICH without significant hx of HTN concerning for CAA Pt does have hx of cognitive impairment with behavioral disturbance per wife, especially since 11/2019 after a fall Against antithrombotic use in the future given concerns of CAA at this  time.  DVT LE venous Doppler showed left common femoral vein DVT Not a candidate for anticoagulation at this time Recommend IVC filter Plan to have IVC filter this afternoon.  Dementia  Has appointment with [facility] neurology for LP and evaluation of dementia, but pt not able to go due to onset of ICH Delirium precaution Continue outpt follow up with [facility] neurology  Hyperlipidemia Home meds:  none  LDL 140, goal < 70 Will consider low dose statin (lipitor 20) on discharge   Dysphagia  Speech to follow NPO  S/p cortrak on TF @ 50  Fever and leukocytosis Tmax 101.4->102.6->100.9->102.4->101.7 Leukocytosis WBC 7.6->12.4 ->8.1->7.8->22.9->20.4->24.8-> 23.9 CCM on board Continue monitoring UA neg CXR Minimal left basilar atelectasis On Rocephin now  Other Stroke Risk Factors Advanced age Obstructive sleep apnea, on CPAP at home  Other Active Problems AKI Cre 1.20->1.45->1.03->1.02->0.98 - on IVF and TF  Hospital day # 4  This patient is critically ill due to ICH, cerebral edema, midline shift, fever, leukocytosis, possible seizure, DVT and at significant risk of neurological worsening, death form PE, status epilepticus, cerebral edema, brain herniation, sepsis, heart failure, respiratory failure. This patient's care requires constant monitoring of vital signs, hemodynamics, respiratory and cardiac monitoring, review of multiple databases, neurological assessment, discussion with family, other specialists and medical decision making of high complexity. I spent 35 minutes of neurocritical care time in the care of this patient.  Marvel Plan, MD PhD Stroke Neurology 05/27/2021 6:46 PM    To contact Stroke Continuity provider, please refer to [email or web address]. After hours, contact General Neurology

## 2021-05-27 NOTE — Progress Notes (Signed)
OT Cancellation Note  Patient Details Name: Coen Miyasato MRN: [id number] DOB: 1956-04-21   Cancelled Treatment:    Reason Eval/Treat Not Completed: Patient not medically ready (+DVTs; not on heparin and has not had filter placed. Will assess when medically appropriate.)  [facility] Luisa Dago, OT/L   Acute OT Clinical Specialist Acute Rehabilitation Services Pager [phone] Office [phone]  05/27/2021, 9:32 AM

## 2021-05-27 NOTE — Progress Notes (Signed)
PT Cancellation Note  Patient Details Name: Donald [name] MRN: [id number] DOB: 1956-07-30   Cancelled Treatment:    Reason Eval/Treat Not Completed: Medical issues which prohibited therapy. +DVT in L common femoral vein, not on heparin and has not had filter placed. Will follow-up as medically appropriate.   Raymond Gurney, PT, DPT Acute Rehabilitation Services  Pager: [phone] Office: [phone]    Jewel Baize 05/27/2021, 9:44 AM

## 2021-05-27 NOTE — Consult Note (Signed)
Chief Complaint: Patient was seen in consultation today for inferior vena cava filter placement.  Referring Physician(s): Selmer Dominion, NP (CCM)  Supervising Physician: Mir, Mauri Reading  Patient Status: [facility] - In-pt  History of Present Illness: Donald [name] is a 65 y.o. male with a past medical history significant for OSA and dementia who presented to [facility] ED on 05/11/21 with acute onset of left hemiparesis, right gaze and left facial droop after having intercourse with his wife. Workup revealed an acute intraparenchymal hemorrhage in the posterior right frontal lobe with cerebral edema, he was admitted and placed on Keppra due to seizure risk with cortical natural of the hemorrhage. He was admitted to inpatient rehab on 05/20/21 and on the morning of 6/12 he was noted to have a decrease in his level of consciousness as well as trouble swallowing and a facial droop so a CT head was ordered which showed progression of the right frontal hematoma and vasogenic edema with midline shift. He was transferred to the ICU and started on hypertonic saline for management of cerebral edema. He on 6/15 he was noted to have leukocytosis and fever of unknown etiology for which he was started on empiric ceftriaxone and bilateral lower extremity duplex was obtained which showed an acute left common femoral vein DVT. IR has been consulted for IVC filter placement as patient is not currently a candidate for heparin gtt given recent ICH.  Patient laying in bed, opens eyes to verbal cues and says "good morning." He is able to name the number of fingers being held up and follow simple commands such as raising his right arm, wiggling his right toes. His speech is garbled and difficult to understand. During initial exam his wife was not present however when I returned later to discuss consent she was at bedside. She tells me he was much more alert and interactive with her today but she told him to rest now because she doesn't  want him to overdo it. She understands the indication for IVC filter placement and is agreeable to proceed.   Past Medical History:  Diagnosis Date   Arthritis     Past Surgical History:  Procedure Laterality Date   APPENDECTOMY      Allergies: Sulfa antibiotics  Medications: Prior to Admission medications   Medication Sig Start Date End Date Taking? Authorizing Provider  amLODipine (NORVASC) 10 MG tablet Take 1 tablet (10 mg total) by mouth daily. 05/20/21   Pokhrel, Rebekah Chesterfield, MD  atorvastatin (LIPITOR) 20 MG tablet Take 1 tablet (20 mg total) by mouth daily. 05/19/21 05/19/22  Pokhrel, Rebekah Chesterfield, MD  hydrochlorothiazide (MICROZIDE) 12.5 MG capsule Take 1 capsule (12.5 mg total) by mouth daily. 05/20/21   Pokhrel, Rebekah Chesterfield, MD  Multiple Vitamins-Minerals (MENS 50+ MULTI VITAMIN/MIN PO) Take 1 Dose by mouth in the morning and at bedtime.    [provider]  pantoprazole (PROTONIX) 40 MG tablet Take 1 tablet (40 mg total) by mouth daily. 05/20/21   Pokhrel, Rebekah Chesterfield, MD  polyethylene glycol (MIRALAX / GLYCOLAX) 17 g packet Take 17 g by mouth 2 (two) times daily. 05/19/21   Pokhrel, Rebekah Chesterfield, MD  senna-docusate (SENOKOT-S) 8.6-50 MG tablet Take 1 tablet by mouth 2 (two) times daily. 05/19/21   Pokhrel, Rebekah Chesterfield, MD     Family History  Problem Relation Age of Onset   Hypertension Mother    Colon polyps Neg Hx    Colon cancer Neg Hx    Esophageal cancer Neg Hx    Rectal cancer Neg Hx  Stomach cancer Neg Hx     Social History   Socioeconomic History   Marital status: Married    Spouse name: Not on file   Number of children: Not on file   Years of education: Not on file   Highest education level: Not on file  Occupational History   Not on file  Tobacco Use   Smoking status: Never   Smokeless tobacco: Never  Substance and Sexual Activity   Alcohol use: No   Drug use: No   Sexual activity: Yes    Birth control/protection: None  Other Topics Concern   Not on file  Social History  Narrative   Not on file   Social Determinants of Health   Financial Resource Strain: Not on file  Food Insecurity: Not on file  Transportation Needs: Not on file  Physical Activity: Not on file  Stress: Not on file  Social Connections: Not on file     Review of Systems: A 12 point ROS discussed and pertinent positives are indicated in the HPI above.  All other systems are negative.  Review of Systems  Unable to perform ROS: Mental status change   Vital Signs: BP 126/86 (BP Location: Left Arm)   Pulse 82   Temp 97.6 F (36.4 C) (Axillary)   Resp 11   Wt 145 lb 8.1 oz (66 kg)   SpO2 98%   BMI 24.21 kg/m   Physical Exam Vitals and nursing note reviewed.  Constitutional:      General: He is not in acute distress. HENT:     Head: Normocephalic.     Mouth/Throat:     Mouth: Mucous membranes are dry.     Comments: (+) NG in place Cardiovascular:     Rate and Rhythm: Normal rate and regular rhythm.  Pulmonary:     Effort: Pulmonary effort is normal.     Breath sounds: Normal breath sounds.  Abdominal:     General: There is no distension.     Palpations: Abdomen is soft.  Musculoskeletal:     Right lower leg: No edema.     Left lower leg: No edema.  Skin:    General: Skin is warm and dry.  Neurological:     Mental Status: He is alert.     MD Evaluation Airway: WNL Heart: WNL Abdomen: WNL Chest/ Lungs: WNL ASA  Classification: 3 Mallampati/Airway Score: One   Imaging: DG Abd 1 View  Result Date: 05/20/2021 CLINICAL DATA:  Abdominal pain. EXAM: ABDOMEN - 1 VIEW COMPARISON:  None. FINDINGS: Large amount of intestinal gas throughout small and large bowel suggesting ileus. There does not appear to be a large amount of fecal matter. No focal obstruction is evident. No free air. No abnormal calcifications or significant bone findings. IMPRESSION: Large amount of intestinal gas suggesting ileus. Electronically Signed   By: Paulina FusiMark  Shogry M.D.   On: 05/20/2021 19:25    CT HEAD WO CONTRAST  Result Date: 05/27/2021 CLINICAL DATA:  65 year old male with right hemisphere hemorrhage at code stroke presentation 05/11/2021. Subsequent encounter. EXAM: CT HEAD WITHOUT CONTRAST TECHNIQUE: Contiguous axial images were obtained from the base of the skull through the vertex without intravenous contrast. COMPARISON:  Head CT 05/23/2021 and earlier. FINDINGS: Brain: Evolving right superior frontal lobe hemorrhage. Slowly fading hyperdense and mixed density blood products involving the superior and middle frontal gyri in an area of up to 48 x 35 x 50 mm (AP by transverse by CC). Continued regional edema and mass  effect including effaced right lateral ventricle and leftward midline shift up to 7 mm at the midportion of the septum pellucidum. No ventriculomegaly. No intraventricular or significant extra-axial hemorrhage. Basilar cisterns remain patent. Stable gray-white matter differentiation elsewhere. No acute cortically based infarct. Vascular: Mild Calcified atherosclerosis at the skull base. Skull: Stable, negative. Sinuses/Orbits: Mild to moderate maxillary alveolar recess mucosal thickening. Otherwise paranasal sinuses and mastoids are stable and well aerated. Other: Right nasoenteric tube in place. Visualized orbits and scalp soft tissues are within normal limits. IMPRESSION: 1. Slowly evolving right superior frontal lobe hemorrhage, not significantly changed in size or configuration since 05/23/2021, with stable regional edema and mass effect. 2. No new intracranial abnormality. Electronically Signed   By: Odessa Fleming M.D.   On: 05/27/2021 06:24   CT HEAD WO CONTRAST  Result Date: 05/23/2021 CLINICAL DATA:  Follow-up intracerebral hemorrhage EXAM: CT HEAD WITHOUT CONTRAST TECHNIQUE: Contiguous axial images were obtained from the base of the skull through the vertex without intravenous contrast. COMPARISON:  CT 05/23/2021 FINDINGS: Brain: No significant interval change in the overall  size of the intraparenchymal hemorrhagic component seen in the right frontal with some extensive surrounding hypo attenuating edematous changes. Locoregional mass effect with sulcal effacement and effacement of the right lateral ventricle as well as stable subfalcine herniation with a right to left midline shift of approximately 14 mm. Slight medialization of the right uncus without clear transtentorial herniation is also unchanged from prior. Basal cisterns remain patent. No new sites of hemorrhage. No new loss of gray-white differentiation. Vascular: No hyperdense vessel or unexpected calcification. Skull: Normal. Negative for fracture or focal lesion. EEG leads in place. Sinuses/Orbits: Mild mural thickening in the maxillary sinuses. Paranasal sinuses and mastoid air cells are otherwise predominantly clear. Included orbital structures are unremarkable. Other: None. IMPRESSION: 1. Stable appearance of the right frontal lobe intraparenchymal hemorrhage with surrounding cerebral edema, local mass effect as well as subfalcine midline shift of approximately 14 mm in slight medial ization of the right uncus, not significantly changed from comparison imaging. 2. No new sites of hemorrhage or new gray-white differentiation loss. Electronically Signed   By: Kreg Shropshire M.D.   On: 05/23/2021 23:58   CT HEAD WO CONTRAST  Result Date: 05/23/2021 CLINICAL DATA:  Follow-up right frontal parenchymal hemorrhage seen earlier today. EXAM: CT HEAD WITHOUT CONTRAST TECHNIQUE: Contiguous axial images were obtained from the base of the skull through the vertex without intravenous contrast. COMPARISON:  Earlier today. FINDINGS: Brain: No significant change in the previously described high right frontal lobe parenchymal hematoma with extensive surrounding edema and midline shift to the left. The midline shift currently measures 10 mm on image number 16/3. No interval ventricular enlargement. No new hemorrhage. Vascular: No  hyperdense vessel or unexpected calcification. Skull: Normal. Negative for fracture or focal lesion. Sinuses/Orbits: Stable small left frontal sinus osteoma. Mild moderate left maxillary sinus mucosal thickening without significant change. Unremarkable orbits. Other: Bilateral concha bullosa. IMPRESSION: 1. Stable large high right frontal parenchymal hematoma with extensive associated edema and midline shift to the left. 2. Stable left maxillary chronic sinusitis. Electronically Signed   By: Beckie Salts M.D.   On: 05/23/2021 18:00   CT HEAD WO CONTRAST  Result Date: 05/23/2021 CLINICAL DATA:  Cerebral hemorrhage, decreased responsiveness EXAM: CT HEAD WITHOUT CONTRAST TECHNIQUE: Contiguous axial images were obtained from the base of the skull through the vertex without intravenous contrast. COMPARISON:  Earlier same day FINDINGS: Brain: Parenchymal hematoma centered within the high right frontal  lobe does not measure substantially changed from the prior study. Extensive surrounding edema is again noted with regional mass effect. Leftward midline shift measures similar at 14 mm at the level of the septum pellucidum. Ventricle caliber is unchanged with no new trapping. No significant central herniation. There is no new loss of gray-white differentiation. Intraventricular hemorrhage is identified. Vascular: No new findings. Skull: Unremarkable. Sinuses/Orbits: No acute finding. Other: None. IMPRESSION: No substantial change in right frontal parenchymal hemorrhage, edema, and mass effect including midline shift. No new ventricle trapping or acute infarction. Electronically Signed   By: Guadlupe Spanish M.D.   On: 05/23/2021 12:49   CT HEAD WO CONTRAST  Result Date: 05/11/2021 CLINICAL DATA:  Stroke.  Intracranial hemorrhage follow-up. EXAM: CT HEAD WITHOUT CONTRAST TECHNIQUE: Contiguous axial images were obtained from the base of the skull through the vertex without intravenous contrast. COMPARISON:  Same day  head CT. FINDINGS: Brain: Mild interval increase in the size of the right frontal intraparenchymal hemorrhage, measuring 4.5 x 2.6 x 3.5 cm (previously 4.2 x 2.3 x 3.1 cm). Similar adjacent small volume subarachnoid hemorrhage. A small amount of subdural hemorrhage in this region is also not excluded. Surrounding edema is similar. Similar local mass effect without midline shift. Basal cisterns are patent. No evidence of acute large vascular territory infarct elsewhere. Similar mild white matter hypodensities, likely chronic microvascular ischemic disease. No hydrocephalus. Vascular: No hyperdense vessel.  Calcific atherosclerosis. Skull: No acute fracture. Sinuses/Orbits: Small left frontal sinus osteoma. Mild right and moderate left maxillary sinus mucosal thickening. Unremarkable orbits. Other: No mastoid effusions. IMPRESSION: Mild interval increase in the size of the right frontal intraparenchymal hemorrhage, measuring 4.5 x 2.6 x 3.5 cm (previously 4.2 x 2.3 x 3.1 cm). Similar adjacent small volume hemorrhage. Electronically Signed   By: Feliberto Harts MD   On: 05/11/2021 17:05   MR BRAIN WO CONTRAST  Result Date: 05/12/2021 CLINICAL DATA:  65 year old male code stroke presentation with intra-axial right hemisphere hemorrhage. Hypertensive (194/104) on presentation. EXAM: MRI HEAD WITHOUT CONTRAST TECHNIQUE: Multiplanar, multiecho pulse sequences of the brain and surrounding structures were obtained without intravenous contrast. COMPARISON:  CT head CTA head and neck 05/11/2021 FINDINGS: Brain: Coronal T2 weighted imaging could not be obtained. And some of the exam is intermittently degraded by motion artifact despite repeated imaging attempts. T1 isointense intra-axial hemorrhage in the posterior right frontal lobe demonstrates a layering hematocrit level (series 13, image 19) and blood encompasses 43 by 38 x 33 mm (AP by transverse by CC) for an estimated volume of 26 mL. Regional edema. Mild regional  mass effect. Trace subarachnoid hemorrhage also suspected on axial FLAIR imaging. Additionally, on SWI there is also chronic hemosiderin in the right parietal lobe on series 19 image 34. But no other definite chronic blood products. Susceptibility related abnormal diffusion in the posterior right frontal lobe with no convincing larger area of restricted diffusion. No restricted diffusion elsewhere. No IVH or ventriculomegaly. Normal basilar cisterns. Negative pituitary and cervicomedullary junction. Wallace Cullens and white matter signal outside of the affected right hemisphere is largely normal for age with mild nonspecific white matter changes. Vascular: Major intracranial vascular flow voids are preserved. Skull and upper cervical spine: Visualized bone marrow signal is within normal limits. Grossly negative cervical spine. Sinuses/Orbits: Negative orbits. Mild to moderate maxillary sinus mucosal thickening. Other: Mastoids are clear. Grossly normal visible internal auditory structures. IMPRESSION: 1. Posterior right frontal lobe intra-axial hemorrhage with layering hematocrit level has not significantly changed (estimated blood volume  of 26 mL). Surrounding edema and mild regional mass effect. Trace subarachnoid hemorrhage. 2. No larger underlying infarct is evident. But chronic hemosiderin in the right parietal lobe indicates a previous intra-axial hemorrhage in the region. 3. No other acute intracranial abnormality. Electronically Signed   By: Odessa Fleming M.D.   On: 05/12/2021 07:26   DG CHEST PORT 1 VIEW  Result Date: 05/24/2021 CLINICAL DATA:  Leukocytosis EXAM: PORTABLE CHEST 1 VIEW COMPARISON:  04/28/15 FINDINGS: Cardiac shadow is within normal limits. Mild aortic calcifications are noted. The lungs are well aerated bilaterally with minimal left basilar atelectasis. No bony abnormality is seen. IMPRESSION: Minimal left basilar atelectasis. Electronically Signed   By: Alcide Clever M.D.   On: 05/24/2021 10:52   DG  Abd Portable 1V  Result Date: 05/24/2021 CLINICAL DATA:  Feeding tube placement EXAM: PORTABLE ABDOMEN - 1 VIEW COMPARISON:  05/20/2021 FINDINGS: Limited radiograph of the lower chest and upper abdomen was obtained for the purposes of enteric tube localization. Enteric tube is seen coursing below the diaphragm with distal tip terminating within the expected location of the gastric body. Air-filled large and small bowel loops throughout the abdomen, similar to prior. IMPRESSION: Enteric tube tip terminates within the gastric body. Electronically Signed   By: Duanne Guess D.O.   On: 05/24/2021 13:50   EEG adult  Result Date: 05/23/2021 Charlsie Quest, MD     05/23/2021  4:11 PM Patient Name: Donald [name] MRN: [id number] Epilepsy Attending: Charlsie Quest Referring Physician/Provider: Dr Bing Neighbors Date: 05/23/2021 Duration: 25.36 mins Patient history:  65 y.o. male currently admitted for ICH on 05/11/21 developed somnolence and aphasia with R gaze deviation that resolved with ativan. EEG to evaluate for seizure Level of alertness: asleep AEDs during EEG study: LEV Technical aspects: This EEG study was done with scalp electrodes positioned according to the 10-20 International system of electrode placement. Electrical activity was acquired at a sampling rate of 500Hz  and reviewed with a high frequency filter of 70Hz  and a low frequency filter of 1Hz . EEG data were recorded continuously and digitally stored. Description: No posterior dominant rhythm was seen. Sleep was characterized by sleep spindles (12 to 14 Hz), maximal frontocentral region. EEG showed continuous generalized 3 to 6 Hz theta-delta slowing. There is an excessive amount of 15 to 18 Hz, 2-3 uV beta activity distributed symmetrically and diffusely.  Hyperventilation and photic stimulation were not performed.   ABNORMALITY - Continuous slow, generalized - Excessive beta, generalized IMPRESSION: This study is suggestive of moderate to  severe diffuse encephalopathy, nonspecific etiology. The excessive beta activity seen in the background is most likely due to the effect of benzodiazepine and is a benign EEG pattern. No seizures or epileptiform discharges were seen throughout the recording. Priyanka   Overnight EEG with video  Result Date: 05/24/2021 , MD     05/25/2021  9:42 AM Patient Name: Donald [name] MRN: Charlsie Quest Epilepsy Attending: 05/27/2021 Referring Physician/Provider: Dr Melanee Left Duration: 05/23/2021 1655 to 05/24/2021 1655  Patient history:  65 y.o. male currently admitted for ICH on 05/11/21 developed somnolence and aphasia with R gaze deviation that resolved with ativan. EEG to evaluate for seizure  Level of alertness: awake, asleep  AEDs during EEG study: LEV  Technical aspects: This EEG study was done with scalp electrodes positioned according to the 10-20 International system of electrode placement. Electrical activity was acquired at a sampling rate of 500Hz  and reviewed with a high frequency filter  of 70Hz  and a low frequency filter of 1Hz . EEG data were recorded continuously and digitally stored.  Description: No posterior dominant rhythm was seen. Sleep was characterized by sleep spindles (12 to 14 Hz), asymmetry ( R<L) maximal frontocentral region. EEG showed continuous generalized and lateralized right hemisphere 3 to 6 Hz theta-delta slowing. Hyperventilation and photic stimulation were not performed.    ABNORMALITY - Continuous slow, generalized and lateralized right hemisphere - Spindle asymmetry, right<left  IMPRESSION: This study is suggestive of cortical dysfunction in right hemisphere likely secondary to underlying structural abnormality.  Additionally there is moderate to severe diffuse encephalopathy, nonspecific etiology. No seizures or definite epileptiform discharges were seen throughout the recording.     MR MRV HEAD W WO CONTRAST  Result Date:  05/12/2021 CLINICAL DATA:  Intracranial hemorrhage. EXAM: MR VENOGRAM HEAD WITHOUT AND WITH CONTRAST TECHNIQUE: Angiographic images of the intracranial venous structures were acquired using MRV technique without and with intravenous contrast. CONTRAST:  29mL GADAVIST GADOBUTROL 1 MMOL/ML IV SOLN COMPARISON:  Brain MRI performed earlier today 05/12/2021. Noncontrast head CT examinations 05/11/2021. CT angiogram head/neck 05/11/2021. FINDINGS: The superior sagittal sinus, internal cerebral veins, vein of Galen, straight sinus, transverse sinuses, sigmoid sinuses and visualized jugular veins are patent. No appreciable intracranial venous thrombosis. Hypoplastic right transverse and sigmoid dural venous sinuses. An acute parenchymal hemorrhage within the posterior right frontal lobe is grossly unchanged in size as compared to the brain MRI performed earlier today. There is mild curvilinear vascular enhancement overlying the site of hemorrhage, which may be reactive. Elsewhere, no abnormal intracranial enhancement is identified. IMPRESSION: No evidence of intracranial venous thrombosis. Non dominant right transverse and sigmoid dural venous sinuses. Electronically Signed   By: 05/13/2021 DO   On: 05/12/2021 11:27   ECHOCARDIOGRAM COMPLETE  Result Date: 05/11/2021    ECHOCARDIOGRAM REPORT   Patient Name:   [facility] Date of Exam: 05/11/2021 Medical Rec #:  [facility]        Height:       68.0 in Accession #:    05/13/2021       Weight:       155.2 lb Date of Birth:  [birth year]         BSA:          1.835 m Patient Age:    65 years         BP:           115/77 mmHg Patient Gender: M                HR:           84 bpm. Exam Location:  Inpatient Procedure: 2D Echo, Cardiac Doppler and Color Doppler Indications:    Stroke I63.9  History:        Patient has no prior history of Echocardiogram examinations.  Sonographer:    [id number] RDCS Referring Phys: 02/13/1956 Roosvelt Maser [facility] IMPRESSIONS  1. Left ventricular  ejection fraction, by estimation, is 65 to 70%. The left ventricle has normal function. The left ventricle has no regional wall motion abnormalities. Left ventricular diastolic parameters were normal.  2. Right ventricular systolic function is normal. The right ventricular size is normal. Tricuspid regurgitation signal is inadequate for assessing PA pressure.  3. The mitral valve is normal in structure. No evidence of mitral valve regurgitation.  4. The aortic valve was not well visualized. Aortic valve regurgitation is not visualized. No aortic stenosis is present.  5.  The inferior vena cava is normal in size with greater than 50% respiratory variability, suggesting right atrial pressure of 3 mmHg. FINDINGS  Left Ventricle: Left ventricular ejection fraction, by estimation, is 65 to 70%. The left ventricle has normal function. The left ventricle has no regional wall motion abnormalities. The left ventricular internal cavity size was normal in size. There is  no left ventricular hypertrophy. Left ventricular diastolic parameters were normal. Right Ventricle: The right ventricular size is normal. No increase in right ventricular wall thickness. Right ventricular systolic function is normal. Tricuspid regurgitation signal is inadequate for assessing PA pressure. Left Atrium: Left atrial size was normal in size. Right Atrium: Right atrial size was normal in size. Pericardium: There is no evidence of pericardial effusion. Mitral Valve: The mitral valve is normal in structure. No evidence of mitral valve regurgitation. Tricuspid Valve: The tricuspid valve is normal in structure. Tricuspid valve regurgitation is not demonstrated. Aortic Valve: The aortic valve was not well visualized. Aortic valve regurgitation is not visualized. No aortic stenosis is present. Aortic valve mean gradient measures 3.0 mmHg. Aortic valve peak gradient measures 6.7 mmHg. Aortic valve area, by VTI measures 2.77 cm. Pulmonic Valve: The  pulmonic valve was not well visualized. Pulmonic valve regurgitation is not visualized. Aorta: The aortic root and ascending aorta are structurally normal, with no evidence of dilitation. Venous: The inferior vena cava is normal in size with greater than 50% respiratory variability, suggesting right atrial pressure of 3 mmHg. IAS/Shunts: The interatrial septum was not well visualized.  LEFT VENTRICLE PLAX 2D LVIDd:         4.90 cm  Diastology LVIDs:         2.80 cm  LV e' medial:    8.38 cm/s LV PW:         0.80 cm  LV E/e' medial:  9.0 LV IVS:        0.90 cm  LV e' lateral:   9.14 cm/s LVOT diam:     1.90 cm  LV E/e' lateral: 8.2 LV SV:         69 LV SV Index:   37 LVOT Area:     2.84 cm  RIGHT VENTRICLE RV Basal diam:  2.90 cm LEFT ATRIUM           Index       RIGHT ATRIUM           Index LA diam:      3.10 cm 1.69 cm/m  RA Area:     11.20 cm LA Vol (A2C): 26.8 ml 14.61 ml/m RA Volume:   21.90 ml  11.94 ml/m LA Vol (A4C): 39.2 ml 21.36 ml/m  AORTIC VALVE AV Area (Vmax):    2.64 cm AV Area (Vmean):   2.59 cm AV Area (VTI):     2.77 cm AV Vmax:           129.00 cm/s AV Vmean:          86.700 cm/s AV VTI:            0.248 m AV Peak Grad:      6.7 mmHg AV Mean Grad:      3.0 mmHg LVOT Vmax:         120.00 cm/s LVOT Vmean:        79.200 cm/s LVOT VTI:          0.242 m LVOT/AV VTI ratio: 0.98  AORTA Ao Root diam: 2.50 cm Ao Asc diam:  3.30  cm MITRAL VALVE MV Area (PHT): 3.60 cm     SHUNTS MV Decel Time: 211 msec     Systemic VTI:  0.24 m MV E velocity: 75.30 cm/s   Systemic Diam: 1.90 cm MV A velocity: 107.00 cm/s MV E/A ratio:  0.70 Epifanio Lesches MD Electronically signed by Epifanio Lesches MD Signature Date/Time: 05/11/2021/5:28:15 PM    Final    CT HEAD CODE STROKE WO CONTRAST  Result Date: 05/23/2021 CLINICAL DATA:  Code stroke.  Acute stroke. EXAM: CT HEAD WITHOUT CONTRAST TECHNIQUE: Contiguous axial images were obtained from the base of the skull through the vertex without intravenous  contrast. COMPARISON:  Head CT from 05/11/2021 FINDINGS: Brain: 5.5 x 2.8 cm hematoma in the subcortical high right frontal region with extensive vasogenic edema in the adjacent brain that is progressed. There is worsening local mass effect with midline shift now measuring 13 mm. No entrapment. No cytotoxic edema seen. Vascular: No hyperdense vessel or unexpected calcification. Skull: Normal. Negative for fracture or focal lesion. Sinuses/Orbits: Negative Other: Critical Value/emergent results were called by telephone at the time of interpretation on 05/23/2021 at 9:10 am to provider [facility] , who verbally acknowledged these results. ASPECTS [facility] Stroke Program Early CT Score) Not scored in this setting IMPRESSION: Progressed right frontal hematoma and vasogenic edema with midline shift now measuring 13 mm. Electronically Signed   By: Marnee Spring M.D.   On: 05/23/2021 09:12   CT HEAD CODE STROKE WO CONTRAST  Result Date: 05/11/2021 CLINICAL DATA:  Code stroke. Left-sided weakness, nausea, and vomiting. EXAM: CT HEAD WITHOUT CONTRAST TECHNIQUE: Contiguous axial images were obtained from the base of the skull through the vertex without intravenous contrast. COMPARISON:  None. FINDINGS: Brain: An acute parenchymal hemorrhage in the high posterior right frontal lobe measures 4.2 x 2.3 x 3.1 cm (approximate volume of 15 mL). There is mild surrounding edema without midline shift, and there is a small amount of adjacent subarachnoid hemorrhage. A very small amount of coexistent subdural hemorrhage is also not excluded in this region. No acute infarct is identified elsewhere. The ventricles are normal in size. Hypodensities in the cerebral white matter bilaterally are nonspecific but compatible with mild chronic small vessel ischemic disease. Vascular: No hyperdense vessel. Skull: No fracture or suspicious osseous lesion. Sinuses/Orbits: Small osteoma in the left frontal sinus. Mild right and moderate left  maxillary sinus mucosal thickening. Clear mastoid air cells. Unremarkable orbits. Other: None. ASPECTS [facility] Stroke Program Early CT Score) Not scored in the presence of acute hemorrhage. IMPRESSION: Acute parenchymal hemorrhage in the posterior right frontal lobe with small amount of adjacent subarachnoid hemorrhage. These results were communicated to Dr. Iver Nestle at 9:08 am on 05/11/2021 by text page via the [facility] messaging system. Electronically Signed   By: Sebastian Ache M.D.   On: 05/11/2021 09:12   VAS [place] LOWER EXTREMITY VENOUS (DVT)  Result Date: 05/26/2021  Lower Venous DVT Study Patient Name:  Donald [name]  Date of Exam:   05/26/2021 Medical Rec #: [id number]         Accession #:    [id number] Date of Birth: 1956/04/17          Patient Gender: M Patient Age:   65Y Exam Location:  [facility] Procedure:      VAS [place] LOWER EXTREMITY VENOUS (DVT) Referring Phys: [id number] Eliezer Champagne --------------------------------------------------------------------------------  Indications: Fever.  Limitations: Poor ultrasound/tissue interface. Comparison Study: No previous exams. Performing Technologist: Ernestene Mention RVT, RDMS  Examination Guidelines:  A complete evaluation includes B-mode imaging, spectral Doppler, color Doppler, and power Doppler as needed of all accessible portions of each vessel. Bilateral testing is considered an integral part of a complete examination. Limited examinations for reoccurring indications may be performed as noted. The reflux portion of the exam is performed with the patient in reverse Trendelenburg.  +---------+---------------+---------+-----------+----------+--------------+ RIGHT    CompressibilityPhasicitySpontaneityPropertiesThrombus Aging +---------+---------------+---------+-----------+----------+--------------+ CFV      Full           Yes      Yes                                 +---------+---------------+---------+-----------+----------+--------------+ SFJ       Full                                                        +---------+---------------+---------+-----------+----------+--------------+ FV Prox  Full           Yes      Yes                                 +---------+---------------+---------+-----------+----------+--------------+ FV Mid   Full           Yes      Yes                                 +---------+---------------+---------+-----------+----------+--------------+ FV DistalFull           Yes      Yes                                 +---------+---------------+---------+-----------+----------+--------------+ PFV      Full                                                        +---------+---------------+---------+-----------+----------+--------------+ POP      Full           Yes      Yes                                 +---------+---------------+---------+-----------+----------+--------------+ PTV      Full                                                        +---------+---------------+---------+-----------+----------+--------------+ PERO     Full                                                        +---------+---------------+---------+-----------+----------+--------------+   +---------+---------------+---------+-----------+----------+-------------------+ LEFT  CompressibilityPhasicitySpontaneityPropertiesThrombus Aging      +---------+---------------+---------+-----------+----------+-------------------+ CFV      Partial        Yes      Yes                  Acute- non                                                                occlusive           +---------+---------------+---------+-----------+----------+-------------------+ SFJ      Full                                                             +---------+---------------+---------+-----------+----------+-------------------+ FV Prox  Full           Yes      Yes                                       +---------+---------------+---------+-----------+----------+-------------------+ FV Mid   Full           Yes      Yes                                      +---------+---------------+---------+-----------+----------+-------------------+ FV DistalFull           Yes      Yes                                      +---------+---------------+---------+-----------+----------+-------------------+ PFV      Full                                                             +---------+---------------+---------+-----------+----------+-------------------+ POP      Full           Yes      Yes                                      +---------+---------------+---------+-----------+----------+-------------------+ PTV                                                   Not well visualized +---------+---------------+---------+-----------+----------+-------------------+ PERO     Full                                                             +---------+---------------+---------+-----------+----------+-------------------+  EIV                     Yes      Yes                  Patent by color and                                                       doppler             +---------+---------------+---------+-----------+----------+-------------------+     Summary: BILATERAL: - No evidence of superficial venous thrombosis in the lower extremities, bilaterally. -No evidence of popliteal cyst, bilaterally. RIGHT: - There is no evidence of deep vein thrombosis in the lower extremity.  LEFT: - Findings consistent with acute deep vein thrombosis involving the left common femoral vein.  *See table(s) above for measurements and observations. Electronically signed by Coral Else MD on 05/26/2021 at 7:50:00 PM.    Final    CT ANGIO HEAD NECK W WO CM (CODE STROKE)  Result Date: 05/23/2021 CLINICAL DATA:  Enlarging hematoma EXAM: CT ANGIOGRAPHY HEAD AND NECK TECHNIQUE: Multidetector CT imaging of  the head and neck was performed using the standard protocol during bolus administration of intravenous contrast. Multiplanar CT image reconstructions and MIPs were obtained to evaluate the vascular anatomy. Carotid stenosis measurements (when applicable) are obtained utilizing NASCET criteria, using the distal internal carotid diameter as the denominator. CONTRAST:  50mL OMNIPAQUE IOHEXOL 350 MG/ML SOLN COMPARISON:  CTA 05/11/2021 FINDINGS: CTA NECK FINDINGS Aortic arch: Normal Right carotid system: Widely patent vessels with smooth contour. No atheromatous changes Left carotid system: Widely patent vessels with smooth contour. No noted atheromatous changes Vertebral arteries: No proximal subclavian stenosis. The vertebral arteries are widely patent. No suspected vertebral beading when allowing for streak artifact. Skeleton: Multilevel degenerative disc narrowing and ridging canal and foraminal encroachment the lower cervical levels. Other neck: No evidence of inflammation or mass. Upper chest: Negative Review of the MIP images confirms the above findings CTA HEAD FINDINGS Anterior circulation: Major vessels are smooth and widely patent. No branch occlusion, generalized beading, or aneurysm. There is a cortical branch at the right frontal parietal convexity which is not continuous beyond the level of the upper and lateral hematoma. No visible nidus or early enhancing veins. Posterior circulation: Vertebral and basilar arteries are smooth and widely patent. No branch occlusion, beading, or aneurysm. Venous sinuses: MRV was obtained previously. The veins are largely nonenhancing on this study. Anatomic variants: None significant Review of the MIP images confirms the above findings IMPRESSION: Seemingly truncated arterial cortical branch at the upper and lateral hematoma, without discrete vascular malformation or spot sign. Recommend follow-up after resolution of mass effect to evaluate for small AVM. Electronically  Signed   By: Marnee Spring M.D.   On: 05/23/2021 09:54   CT ANGIO HEAD NECK W WO CM (CODE STROKE)  Result Date: 05/11/2021 CLINICAL DATA:  Stroke follow-up.  Left-sided weakness. EXAM: CT ANGIOGRAPHY HEAD AND NECK TECHNIQUE: Multidetector CT imaging of the head and neck was performed using the standard protocol during bolus administration of intravenous contrast. Multiplanar CT image reconstructions and MIPs were obtained to evaluate the vascular anatomy. Carotid stenosis measurements (when applicable) are obtained utilizing NASCET criteria, using the distal internal carotid diameter as the denominator. CONTRAST:  50mL  OMNIPAQUE IOHEXOL 350 MG/ML SOLN COMPARISON:  Same day CT head. FINDINGS: CTA NECK FINDINGS Aortic arch: Great vessel origins are patent. Right carotid system: No evidence of dissection, stenosis (50% or greater) or occlusion. Mild apparent irregularity of the ICA at the skull base in a region of streak artifact. Left carotid system: No evidence of dissection, stenosis (50% or greater) or occlusion. Mild apparent irregularity of the ICA at the skull base in a region of streak artifact. Vertebral arteries: Codominant. No evidence of dissection, stenosis (50% or greater) or occlusion. Skeleton: Moderate degenerative disc disease at at C5-C6 and C6-C7 with disc height loss, endplate sclerosis and posterior disc osteophyte complexes. Other neck: No acute abnormality Upper chest: Visualized lung apices are clear. Review of the MIP images confirms the above findings CTA HEAD FINDINGS Anterior circulation: No large vessel occlusion or proximal hemodynamically significant stenosis. Evaluation of the distal vasculature is limited due to venous contamination. No evidence of and arteriovenous malformation or aneurysm in the region of intraparenchymal hemorrhage, although acute blood products limit evaluation. Posterior circulation: No large vessel occlusion or proximal hemodynamically significant stenosis.  Venous sinuses: The superior sagittal sinus is narrowed in the region of hemorrhage, most likely from mass effect. Small right transverse and sigmoid sinuses. Review of the MIP images confirms the above findings IMPRESSION: CTA Head: 1. No large vessel occlusion or hemodynamically significant proximal arterial stenosis. 2. The superior sagittal sinus is narrowed in the region of hemorrhage with poorly visualized draining cortical veins in this region. While indeterminate, this may be secondary to mass effect given no definite/clear intraluminal thrombus. Given these findings and the location of hemorrhage; however, recommend low threshold for follow-up CTV or MRI with contrast to ensure stability and exclude worsening thrombosis. 3. No evidence of and arteriovenous malformation or aneurysm in the region of intraparenchymal hemorrhage, although acute blood products limit evaluation. Follow-up imaging after resolution of hemorrhage could provide further assessment if clinically indicated. CTA Neck: 1. No significant (greater than 50%) stenosis. 2. Mild apparent irregularity of bilateral ICAs at the skull base is favored artifactual given streak artifact from dental amalgam in this region. Fibromuscular dysplasia is a differential consideration. Findings and recommendations discussed with Dr. Iver Nestle via telephone at 9:59 a.m. Electronically Signed   By: Feliberto Harts MD   On: 05/11/2021 10:09    Labs:  CBC: Recent Labs    05/24/21 0134 05/25/21 0141 05/26/21 0335 05/27/21 0300  WBC 22.9* 20.4* 24.8* 23.9*  HGB 16.3 14.7 14.0 14.3  HCT 49.6 44.1 43.9 44.6  PLT 309 268 360 425*    COAGS: Recent Labs    05/11/21 0851  INR 1.0  APTT 26    BMP: Recent Labs    09/11/20 1449 05/11/21 0851 05/24/21 0134 05/24/21 1610 05/25/21 0141 05/25/21 0743 05/25/21 2306 05/26/21 0335 05/26/21 0912 05/27/21 0300  NA 143   < > 135   < > 144  145   < > 156* 158* 155* 156*  K 4.3   < > 4.4  --  4.4   --   --  4.0  --  4.2  CL 103   < > 96*  --  115*  --   --  121*  --  119*  CO2 26   < > 27  --  23  --   --  28  --  30  GLUCOSE 96   < > 135*  --  200*  --   --  168*  --  93  BUN 10   < > 27*  --  29*  --   --  23  --  25*  CALCIUM 10.1   < > 10.1  --  9.2  --   --  9.7  --  9.8  CREATININE 1.17   < > 1.45*  --  1.03  --   --  1.02  --  0.98  GFRNONAA 66   < > 54*  --  >60  --   --  >60  --  >60  GFRAA 76  --   --   --   --   --   --   --   --   --    < > = values in this interval not displayed.    LIVER FUNCTION TESTS: Recent Labs    09/11/20 1449 05/11/21 0851 05/12/21 0341 05/21/21 0532  BILITOT 0.5 0.7 0.8 0.9  AST 24 23 24 27   ALT 23 17 19 25   ALKPHOS 124* 94 108 100  PROT 7.6 6.7 7.5 7.7  ALBUMIN 4.8 3.9 4.3 4.0    TUMOR MARKERS: No results for input(s): AFPTM, CEA, CA199, CHROMGRNA in the last 8760 hours.  Assessment and Plan:  65 y/o M admitted to ICU for management of ICH and cerebral edema found to have acute left common femoral vein DVT seen today for IVC filter placement as he is not currently a candidate for anticoagulation given recent ICH. Patient history and imaging reviewed by Dr. today who approves procedure - plan for IVC filter placement with moderate sedation today in IR pending any emergent procedures.  Risks and benefits discussed with the patient including, but not limited to bleeding, infection, contrast induced renal failure, filter fracture or migration which can lead to emergency surgery or even death, strut penetration with damage or irritation to adjacent structures and caval thrombosis.  All of the patient's questions were answered, patient is agreeable to proceed.  Consent signed and in chart.  Thank you for this interesting consult.  I greatly enjoyed meeting Donald [name] and look forward to participating in their care.  A copy of this report was sent to the requesting provider on this date.  Electronically Signed: Bryn Gulling, PA-C 05/27/2021, 9:16 AM   I spent a total of 40 Minutes in face to face in clinical consultation, greater than 50% of which was counseling/coordinating care for IVC filter placement.

## 2021-05-27 NOTE — Procedures (Signed)
Interventional Radiology Procedure Note  Procedure: IVC filter placement  Indication: Intracranial hemorrhage. DVT.  Findings: Please refer to procedural dictation for full description.  Complications: None  EBL: < 10 mL  Acquanetta Belling, MD [phone]

## 2021-05-27 NOTE — Progress Notes (Addendum)
SLP Cancellation Note  Patient Details Name: Donald [name] MRN: [id number] DOB: 16-Feb-1956   Cancelled treatment:        Unable for PO trials due to IR procedure today for filter and NPO.   [name] 05/27/2021, 10:09 AM

## 2021-05-27 NOTE — Progress Notes (Signed)
Referring Physician(s): Marvel Plan  Supervising Physician: Julieanne Cotton  Patient Status:  [facility] - In-pt  Chief Complaint: ICH  Subjective:  Patient more alert this morning, opens eyes to verbal cues and says "good morning." He is able to state the number of fingers being held up and follow simple commands, when asked to name items/colors he mumbles unintelligibly. On initial exam his wife was not present however when I returned later to discuss IVC filter placement she was at bedside and tells me that he was much more alert and interactive with her this morning, so much so that she was worried he would get too tired and had him rest. She is wondering if he will need a shunt for swelling as she knows a friend who has twins with shunts for brain swelling. She is concerned that the brain swelling will affect his ability to get the angiogram.   Allergies: Sulfa antibiotics  Medications: Prior to Admission medications   Medication Sig Start Date End Date Taking? Authorizing Provider  amLODipine (NORVASC) 10 MG tablet Take 1 tablet (10 mg total) by mouth daily. 05/20/21   Pokhrel, Rebekah Chesterfield, MD  atorvastatin (LIPITOR) 20 MG tablet Take 1 tablet (20 mg total) by mouth daily. 05/19/21 05/19/22  Pokhrel, Rebekah Chesterfield, MD  hydrochlorothiazide (MICROZIDE) 12.5 MG capsule Take 1 capsule (12.5 mg total) by mouth daily. 05/20/21   Pokhrel, Rebekah Chesterfield, MD  Multiple Vitamins-Minerals (MENS 50+ MULTI VITAMIN/MIN PO) Take 1 Dose by mouth in the morning and at bedtime.    [provider]  pantoprazole (PROTONIX) 40 MG tablet Take 1 tablet (40 mg total) by mouth daily. 05/20/21   Pokhrel, Rebekah Chesterfield, MD  polyethylene glycol (MIRALAX / GLYCOLAX) 17 g packet Take 17 g by mouth 2 (two) times daily. 05/19/21   Pokhrel, Rebekah Chesterfield, MD  senna-docusate (SENOKOT-S) 8.6-50 MG tablet Take 1 tablet by mouth 2 (two) times daily. 05/19/21   Pokhrel, Rebekah Chesterfield, MD     Vital Signs: BP 126/86 (BP Location: Left Arm)   Pulse 82   Temp 97.6  F (36.4 C) (Axillary)   Resp 11   Wt 145 lb 8.1 oz (66 kg)   SpO2 98%   BMI 24.21 kg/m   Physical Exam Vitals and nursing note reviewed.  Constitutional:      General: He is not in acute distress. HENT:     Head: Normocephalic.     Mouth/Throat:     Mouth: Mucous membranes are dry.  Cardiovascular:     Rate and Rhythm: Normal rate.  Pulmonary:     Effort: Pulmonary effort is normal.  Abdominal:     General: There is no distension.     Palpations: Abdomen is soft.     Tenderness: There is no abdominal tenderness.  Skin:    General: Skin is warm and dry.  Neurological:     Mental Status: He is alert.  Alert - he is not able to answer orientation questions appropriately. Speech is garbled, he follows simple commands such as moving his extremities and opening his mouth. He is able to accurately state the number of fingers being held up (2 and 5). PERRL bilaterally EOMs without obvious nystagmus. Visual fields grossly in tact No obvious facial asymmetry. Tongue midline. Motor power - does not move left upper extremity and left lower extremity when asked, able to wiggle right toes and lift right arm/wiggle right fingers. Pronator drift - not assessed. Fine motor and coordination - difficult to assess as patient has difficulty following  commands  Gait - unable to assess Romberg - not assessed Heel to toe - not assessed   Imaging: CT HEAD WO CONTRAST  Result Date: 05/27/2021 CLINICAL DATA:  65 year old male with right hemisphere hemorrhage at code stroke presentation 05/11/2021. Subsequent encounter. EXAM: CT HEAD WITHOUT CONTRAST TECHNIQUE: Contiguous axial images were obtained from the base of the skull through the vertex without intravenous contrast. COMPARISON:  Head CT 05/23/2021 and earlier. FINDINGS: Brain: Evolving right superior frontal lobe hemorrhage. Slowly fading hyperdense and mixed density blood products involving the superior and middle frontal gyri in an area  of up to 48 x 35 x 50 mm (AP by transverse by CC). Continued regional edema and mass effect including effaced right lateral ventricle and leftward midline shift up to 7 mm at the midportion of the septum pellucidum. No ventriculomegaly. No intraventricular or significant extra-axial hemorrhage. Basilar cisterns remain patent. Stable gray-white matter differentiation elsewhere. No acute cortically based infarct. Vascular: Mild Calcified atherosclerosis at the skull base. Skull: Stable, negative. Sinuses/Orbits: Mild to moderate maxillary alveolar recess mucosal thickening. Otherwise paranasal sinuses and mastoids are stable and well aerated. Other: Right nasoenteric tube in place. Visualized orbits and scalp soft tissues are within normal limits. IMPRESSION: 1. Slowly evolving right superior frontal lobe hemorrhage, not significantly changed in size or configuration since 05/23/2021, with stable regional edema and mass effect. 2. No new intracranial abnormality. Electronically Signed   By: Odessa Fleming M.D.   On: 05/27/2021 06:24   CT HEAD WO CONTRAST  Result Date: 05/23/2021 CLINICAL DATA:  Follow-up intracerebral hemorrhage EXAM: CT HEAD WITHOUT CONTRAST TECHNIQUE: Contiguous axial images were obtained from the base of the skull through the vertex without intravenous contrast. COMPARISON:  CT 05/23/2021 FINDINGS: Brain: No significant interval change in the overall size of the intraparenchymal hemorrhagic component seen in the right frontal with some extensive surrounding hypo attenuating edematous changes. Locoregional mass effect with sulcal effacement and effacement of the right lateral ventricle as well as stable subfalcine herniation with a right to left midline shift of approximately 14 mm. Slight medialization of the right uncus without clear transtentorial herniation is also unchanged from prior. Basal cisterns remain patent. No new sites of hemorrhage. No new loss of gray-white differentiation. Vascular: No  hyperdense vessel or unexpected calcification. Skull: Normal. Negative for fracture or focal lesion. EEG leads in place. Sinuses/Orbits: Mild mural thickening in the maxillary sinuses. Paranasal sinuses and mastoid air cells are otherwise predominantly clear. Included orbital structures are unremarkable. Other: None. IMPRESSION: 1. Stable appearance of the right frontal lobe intraparenchymal hemorrhage with surrounding cerebral edema, local mass effect as well as subfalcine midline shift of approximately 14 mm in slight medial ization of the right uncus, not significantly changed from comparison imaging. 2. No new sites of hemorrhage or new gray-white differentiation loss. Electronically Signed   By: Kreg Shropshire M.D.   On: 05/23/2021 23:58   CT HEAD WO CONTRAST  Result Date: 05/23/2021 CLINICAL DATA:  Follow-up right frontal parenchymal hemorrhage seen earlier today. EXAM: CT HEAD WITHOUT CONTRAST TECHNIQUE: Contiguous axial images were obtained from the base of the skull through the vertex without intravenous contrast. COMPARISON:  Earlier today. FINDINGS: Brain: No significant change in the previously described high right frontal lobe parenchymal hematoma with extensive surrounding edema and midline shift to the left. The midline shift currently measures 10 mm on image number 16/3. No interval ventricular enlargement. No new hemorrhage. Vascular: No hyperdense vessel or unexpected calcification. Skull: Normal. Negative for fracture or  focal lesion. Sinuses/Orbits: Stable small left frontal sinus osteoma. Mild moderate left maxillary sinus mucosal thickening without significant change. Unremarkable orbits. Other: Bilateral concha bullosa. IMPRESSION: 1. Stable large high right frontal parenchymal hematoma with extensive associated edema and midline shift to the left. 2. Stable left maxillary chronic sinusitis. Electronically Signed   By: Beckie Salts M.D.   On: 05/23/2021 18:00   CT HEAD WO  CONTRAST  Result Date: 05/23/2021 CLINICAL DATA:  Cerebral hemorrhage, decreased responsiveness EXAM: CT HEAD WITHOUT CONTRAST TECHNIQUE: Contiguous axial images were obtained from the base of the skull through the vertex without intravenous contrast. COMPARISON:  Earlier same day FINDINGS: Brain: Parenchymal hematoma centered within the high right frontal lobe does not measure substantially changed from the prior study. Extensive surrounding edema is again noted with regional mass effect. Leftward midline shift measures similar at 14 mm at the level of the septum pellucidum. Ventricle caliber is unchanged with no new trapping. No significant central herniation. There is no new loss of gray-white differentiation. Intraventricular hemorrhage is identified. Vascular: No new findings. Skull: Unremarkable. Sinuses/Orbits: No acute finding. Other: None. IMPRESSION: No substantial change in right frontal parenchymal hemorrhage, edema, and mass effect including midline shift. No new ventricle trapping or acute infarction. Electronically Signed   By: Guadlupe Spanish M.D.   On: 05/23/2021 12:49   DG CHEST PORT 1 VIEW  Result Date: 05/24/2021 CLINICAL DATA:  Leukocytosis EXAM: PORTABLE CHEST 1 VIEW COMPARISON:  04/28/15 FINDINGS: Cardiac shadow is within normal limits. Mild aortic calcifications are noted. The lungs are well aerated bilaterally with minimal left basilar atelectasis. No bony abnormality is seen. IMPRESSION: Minimal left basilar atelectasis. Electronically Signed   By: Alcide Clever M.D.   On: 05/24/2021 10:52   DG Abd Portable 1V  Result Date: 05/24/2021 CLINICAL DATA:  Feeding tube placement EXAM: PORTABLE ABDOMEN - 1 VIEW COMPARISON:  05/20/2021 FINDINGS: Limited radiograph of the lower chest and upper abdomen was obtained for the purposes of enteric tube localization. Enteric tube is seen coursing below the diaphragm with distal tip terminating within the expected location of the gastric body.  Air-filled large and small bowel loops throughout the abdomen, similar to prior. IMPRESSION: Enteric tube tip terminates within the gastric body. Electronically Signed   By: Duanne Guess D.O.   On: 05/24/2021 13:50   EEG adult  Result Date: 05/23/2021 Charlsie Quest, MD     05/23/2021  4:11 PM Patient Name: Donald [name] MRN: [id number] Epilepsy Attending: Charlsie Quest Referring Physician/Provider: Dr Bing Neighbors Date: 05/23/2021 Duration: 25.36 mins Patient history:  65 y.o. male currently admitted for ICH on 05/11/21 developed somnolence and aphasia with R gaze deviation that resolved with ativan. EEG to evaluate for seizure Level of alertness: asleep AEDs during EEG study: LEV Technical aspects: This EEG study was done with scalp electrodes positioned according to the 10-20 International system of electrode placement. Electrical activity was acquired at a sampling rate of  and reviewed with a high frequency filter of  and a low frequency filter of . EEG data were recorded continuously and digitally stored. Description: No posterior dominant rhythm was seen. Sleep was characterized by sleep spindles (12 to 14 Hz), maximal frontocentral region. EEG showed continuous generalized 3 to 6 Hz theta-delta slowing. There is an excessive amount of 15 to 18 Hz, 2-3 uV beta activity distributed symmetrically and diffusely.  Hyperventilation and photic stimulation were not performed.   ABNORMALITY - Continuous slow, generalized - Excessive beta, generalized IMPRESSION: This  study is suggestive of moderate to severe diffuse encephalopathy, nonspecific etiology. The excessive beta activity seen in the background is most likely due to the effect of benzodiazepine and is a benign EEG pattern. No seizures or epileptiform discharges were seen throughout the recording. Priyanka Annabelle Harman   Overnight EEG with video  Result Date: 05/24/2021 Charlsie Quest, MD     05/25/2021  9:42 AM Patient Name:  Donald [name] MRN: [id number] Epilepsy Attending: Charlsie Quest Referring Physician/Provider: Dr Bing Neighbors Duration: 05/23/2021 1655 to 05/24/2021 1655  Patient history:  65 y.o. male currently admitted for ICH on 05/11/21 developed somnolence and aphasia with R gaze deviation that resolved with ativan. EEG to evaluate for seizure  Level of alertness: awake, asleep  AEDs during EEG study: LEV  Technical aspects: This EEG study was done with scalp electrodes positioned according to the 10-20 International system of electrode placement. Electrical activity was acquired at a sampling rate of  and reviewed with a high frequency filter of  and a low frequency filter of . EEG data were recorded continuously and digitally stored.  Description: No posterior dominant rhythm was seen. Sleep was characterized by sleep spindles (12 to 14 Hz), asymmetry ( R<L) maximal frontocentral region. EEG showed continuous generalized and lateralized right hemisphere 3 to 6 Hz theta-delta slowing. Hyperventilation and photic stimulation were not performed.    ABNORMALITY - Continuous slow, generalized and lateralized right hemisphere - Spindle asymmetry, right<left  IMPRESSION: This study is suggestive of cortical dysfunction in right hemisphere likely secondary to underlying structural abnormality.  Additionally there is moderate to severe diffuse encephalopathy, nonspecific etiology. No seizures or definite epileptiform discharges were seen throughout the recording.   Charlsie Quest   CT HEAD CODE STROKE WO CONTRAST  Result Date: 05/23/2021 CLINICAL DATA:  Code stroke.  Acute stroke. EXAM: CT HEAD WITHOUT CONTRAST TECHNIQUE: Contiguous axial images were obtained from the base of the skull through the vertex without intravenous contrast. COMPARISON:  Head CT from 05/11/2021 FINDINGS: Brain: 5.5 x 2.8 cm hematoma in the subcortical high right frontal region with extensive vasogenic edema in the adjacent brain that is  progressed. There is worsening local mass effect with midline shift now measuring 13 mm. No entrapment. No cytotoxic edema seen. Vascular: No hyperdense vessel or unexpected calcification. Skull: Normal. Negative for fracture or focal lesion. Sinuses/Orbits: Negative Other: Critical Value/emergent results were called by telephone at the time of interpretation on 05/23/2021 at 9:10 am to provider [facility] , who verbally acknowledged these results. ASPECTS [facility] Stroke Program Early CT Score) Not scored in this setting IMPRESSION: Progressed right frontal hematoma and vasogenic edema with midline shift now measuring 13 mm. Electronically Signed   By: Marnee Spring M.D.   On: 05/23/2021 09:12   VAS [place] LOWER EXTREMITY VENOUS (DVT)  Result Date: 05/26/2021  Lower Venous DVT Study Patient Name:  Donald [name]  Date of Exam:   05/26/2021 Medical Rec #: [id number]         Accession #:    [id number] Date of Birth: 04-15-1956          Patient Gender: M Patient Age:   65Y Exam Location:  [facility] Procedure:      VAS [place] LOWER EXTREMITY VENOUS (DVT) Referring Phys: [id number] Eliezer Champagne --------------------------------------------------------------------------------  Indications: Fever.  Limitations: Poor ultrasound/tissue interface. Comparison Study: No previous exams. Performing Technologist: Ernestene Mention RVT, RDMS  Examination Guidelines: A complete evaluation includes B-mode imaging, spectral Doppler, color Doppler,  and power Doppler as needed of all accessible portions of each vessel. Bilateral testing is considered an integral part of a complete examination. Limited examinations for reoccurring indications may be performed as noted. The reflux portion of the exam is performed with the patient in reverse Trendelenburg.  +---------+---------------+---------+-----------+----------+--------------+ RIGHT    CompressibilityPhasicitySpontaneityPropertiesThrombus Aging  +---------+---------------+---------+-----------+----------+--------------+ CFV      Full           Yes      Yes                                 +---------+---------------+---------+-----------+----------+--------------+ SFJ      Full                                                        +---------+---------------+---------+-----------+----------+--------------+ FV Prox  Full           Yes      Yes                                 +---------+---------------+---------+-----------+----------+--------------+ FV Mid   Full           Yes      Yes                                 +---------+---------------+---------+-----------+----------+--------------+ FV DistalFull           Yes      Yes                                 +---------+---------------+---------+-----------+----------+--------------+ PFV      Full                                                        +---------+---------------+---------+-----------+----------+--------------+ POP      Full           Yes      Yes                                 +---------+---------------+---------+-----------+----------+--------------+ PTV      Full                                                        +---------+---------------+---------+-----------+----------+--------------+ PERO     Full                                                        +---------+---------------+---------+-----------+----------+--------------+   +---------+---------------+---------+-----------+----------+-------------------+ LEFT     CompressibilityPhasicitySpontaneityPropertiesThrombus Aging      +---------+---------------+---------+-----------+----------+-------------------+ CFV  Partial        Yes      Yes                  Acute- non                                                                occlusive           +---------+---------------+---------+-----------+----------+-------------------+ SFJ      Full                                                              +---------+---------------+---------+-----------+----------+-------------------+ FV Prox  Full           Yes      Yes                                      +---------+---------------+---------+-----------+----------+-------------------+ FV Mid   Full           Yes      Yes                                      +---------+---------------+---------+-----------+----------+-------------------+ FV DistalFull           Yes      Yes                                      +---------+---------------+---------+-----------+----------+-------------------+ PFV      Full                                                             +---------+---------------+---------+-----------+----------+-------------------+ POP      Full           Yes      Yes                                      +---------+---------------+---------+-----------+----------+-------------------+ PTV                                                   Not well visualized +---------+---------------+---------+-----------+----------+-------------------+ PERO     Full                                                             +---------+---------------+---------+-----------+----------+-------------------+  EIV                     Yes      Yes                  Patent by color and                                                       doppler             +---------+---------------+---------+-----------+----------+-------------------+     Summary: BILATERAL: - No evidence of superficial venous thrombosis in the lower extremities, bilaterally. -No evidence of popliteal cyst, bilaterally. RIGHT: - There is no evidence of deep vein thrombosis in the lower extremity.  LEFT: - Findings consistent with acute deep vein thrombosis involving the left common femoral vein.  *See table(s) above for measurements and observations. Electronically signed by Coral ElseVance Brabham  MD on 05/26/2021 at 7:50:00 PM.    Final    CT ANGIO HEAD NECK W WO CM (CODE STROKE)  Result Date: 05/23/2021 CLINICAL DATA:  Enlarging hematoma EXAM: CT ANGIOGRAPHY HEAD AND NECK TECHNIQUE: Multidetector CT imaging of the head and neck was performed using the standard protocol during bolus administration of intravenous contrast. Multiplanar CT image reconstructions and MIPs were obtained to evaluate the vascular anatomy. Carotid stenosis measurements (when applicable) are obtained utilizing NASCET criteria, using the distal internal carotid diameter as the denominator. CONTRAST:  50mL OMNIPAQUE IOHEXOL 350 MG/ML SOLN COMPARISON:  CTA 05/11/2021 FINDINGS: CTA NECK FINDINGS Aortic arch: Normal Right carotid system: Widely patent vessels with smooth contour. No atheromatous changes Left carotid system: Widely patent vessels with smooth contour. No noted atheromatous changes Vertebral arteries: No proximal subclavian stenosis. The vertebral arteries are widely patent. No suspected vertebral beading when allowing for streak artifact. Skeleton: Multilevel degenerative disc narrowing and ridging canal and foraminal encroachment the lower cervical levels. Other neck: No evidence of inflammation or mass. Upper chest: Negative Review of the MIP images confirms the above findings CTA HEAD FINDINGS Anterior circulation: Major vessels are smooth and widely patent. No branch occlusion, generalized beading, or aneurysm. There is a cortical branch at the right frontal parietal convexity which is not continuous beyond the level of the upper and lateral hematoma. No visible nidus or early enhancing veins. Posterior circulation: Vertebral and basilar arteries are smooth and widely patent. No branch occlusion, beading, or aneurysm. Venous sinuses: MRV was obtained previously. The veins are largely nonenhancing on this study. Anatomic variants: None significant Review of the MIP images confirms the above findings IMPRESSION:  Seemingly truncated arterial cortical branch at the upper and lateral hematoma, without discrete vascular malformation or spot sign. Recommend follow-up after resolution of mass effect to evaluate for small AVM. Electronically Signed   By: Marnee SpringJonathon  Watts M.D.   On: 05/23/2021 09:54    Labs:  CBC: Recent Labs    05/24/21 0134 05/25/21 0141 05/26/21 0335 05/27/21 0300  WBC 22.9* 20.4* 24.8* 23.9*  HGB 16.3 14.7 14.0 14.3  HCT 49.6 44.1 43.9 44.6  PLT 309 268 360 425*    COAGS: Recent Labs    05/11/21 0851  INR 1.0  APTT 26    BMP: Recent Labs    09/11/20 1449 05/11/21 0851 05/24/21 0134 05/24/21 [id number] 05/25/21 0141 05/25/21 [id number] 05/25/21 2306 05/26/21 0335  05/26/21 0912 05/27/21 0300  NA 143   < > 135   < > 144  145   < > 156* 158* 155* 156*  K 4.3   < > 4.4  --  4.4  --   --  4.0  --  4.2  CL 103   < > 96*  --  115*  --   --  121*  --  119*  CO2 26   < > 27  --  23  --   --  28  --  30  GLUCOSE 96   < > 135*  --  200*  --   --  168*  --  93  BUN 10   < > 27*  --  29*  --   --  23  --  25*  CALCIUM 10.1   < > 10.1  --  9.2  --   --  9.7  --  9.8  CREATININE 1.17   < > 1.45*  --  1.03  --   --  1.02  --  0.98  GFRNONAA 66   < > 54*  --  >60  --   --  >60  --  >60  GFRAA 76  --   --   --   --   --   --   --   --   --    < > = values in this interval not displayed.    LIVER FUNCTION TESTS: Recent Labs    09/11/20 1449 05/11/21 0851 05/12/21 0341 05/21/21 0532  BILITOT 0.5 0.7 0.8 0.9  AST ALT ALKPHOS 124* 94 108 100  PROT 7.6 6.7 7.5 7.7  ALBUMIN 4.8 3.9 4.3 4.0    Assessment and Plan:  65 y/o M admitted for ICH and cerebral edema - NIR consulted for possible diagnostic cerebral angiogram due to worsening ICH with concern for possible AVM which was pending improvement in patient's mental status. On exam today patient is again more alert and interactive, able to follow simple commands and open eyes. Given improvement will plan  to proceed with diagnostic cerebral angiogram tomorrow (6/17) in NIR with Dr. Corliss Skains. Patient is planned to undergo IVC filter placement today in IR.  Discussed procedure plans with patient's wife today who is agreeable - consent previously signed and is in chart.  Tube feeds to be held at midnight, IR will call for patient when ready.   Please call with questions or concerns.  Electronically Signed: Villa Herb, PA-C 05/27/2021, 9:16 AM   I spent a total of 15 Minutes at the the patient's bedside AND on the patient's hospital floor or unit, greater than 50% of which was counseling/coordinating care for diagnostic cerebral angiogram/ICH follow up.

## 2021-05-28 ENCOUNTER — Inpatient Hospital Stay (HOSPITAL_COMMUNITY): Payer: No Typology Code available for payment source

## 2021-05-28 DIAGNOSIS — D72828 Other elevated white blood cell count: Secondary | ICD-10-CM

## 2021-05-28 HISTORY — PX: IR ANGIO VERTEBRAL SEL VERTEBRAL BILAT MOD SED: IMG5369

## 2021-05-28 HISTORY — PX: IR ANGIO INTRA EXTRACRAN SEL COM CAROTID INNOMINATE BILAT MOD SED: IMG5360

## 2021-05-28 LAB — CBC
HCT: 49.7 % (ref 39.0–52.0)
Hemoglobin: 15.6 g/dL (ref 13.0–17.0)
MCH: 29.9 pg (ref 26.0–34.0)
MCHC: 31.4 g/dL (ref 30.0–36.0)
MCV: 95.4 fL (ref 80.0–100.0)
Platelets: 492 10*3/uL — ABNORMAL HIGH (ref 150–400)
RBC: 5.21 MIL/uL (ref 4.22–5.81)
RDW: 14.9 % (ref 11.5–15.5)
WBC: 17.7 10*3/uL — ABNORMAL HIGH (ref 4.0–10.5)
nRBC: 0 % (ref 0.0–0.2)

## 2021-05-28 LAB — BASIC METABOLIC PANEL
Anion gap: 12 (ref 5–15)
BUN: 26 mg/dL — ABNORMAL HIGH (ref 8–23)
CO2: 31 mmol/L (ref 22–32)
Calcium: 10.4 mg/dL — ABNORMAL HIGH (ref 8.9–10.3)
Chloride: 114 mmol/L — ABNORMAL HIGH (ref 98–111)
Creatinine, Ser: 1.09 mg/dL (ref 0.61–1.24)
GFR, Estimated: >60 mL/min (ref >60)
Glucose, Bld: 134 mg/dL — ABNORMAL HIGH (ref 70–99)
Potassium: 3.8 mmol/L (ref 3.5–5.1)
Sodium: 157 mmol/L — ABNORMAL HIGH (ref 135–145)

## 2021-05-28 LAB — URINE CULTURE
Culture: 30000 — AB
Special Requests: NORMAL

## 2021-05-28 LAB — GLUCOSE, CAPILLARY
Glucose-Capillary: 145 mg/dL — ABNORMAL HIGH (ref 70–99)
Glucose-Capillary: 108 mg/dL — ABNORMAL HIGH (ref 70–99)
Glucose-Capillary: 123 mg/dL — ABNORMAL HIGH (ref 70–99)
Glucose-Capillary: 122 mg/dL — ABNORMAL HIGH (ref 70–99)
Glucose-Capillary: 211 mg/dL — ABNORMAL HIGH (ref 70–99)
Glucose-Capillary: 194 mg/dL — ABNORMAL HIGH (ref 70–99)

## 2021-05-28 LAB — PROCALCITONIN: Procalcitonin: 0.67 ng/mL

## 2021-05-28 LAB — SODIUM: Sodium: 158 mmol/L — ABNORMAL HIGH (ref 135–145)

## 2021-05-28 MED ORDER — FENTANYL CITRATE (PF) 100 MCG/2ML IJ SOLN
INTRAMUSCULAR | Status: AC | PRN
  Administered 2021-05-28: 25 ug via INTRAVENOUS
  Administered 2021-05-28: 12.5 ug via INTRAVENOUS

## 2021-05-28 MED ORDER — SODIUM CHLORIDE 0.9 % IV SOLN: INTRAVENOUS | Status: AC

## 2021-05-28 MED ORDER — FENTANYL CITRATE (PF) 100 MCG/2ML IJ SOLN
INTRAMUSCULAR | Status: AC
  Filled 2021-05-28: qty 2

## 2021-05-28 MED ORDER — LIDOCAINE HCL 1 % IJ SOLN
INTRAMUSCULAR | Status: AC | PRN
  Administered 2021-05-28: 10 mL

## 2021-05-28 MED ORDER — IOHEXOL 240 MG/ML SOLN
INTRAMUSCULAR | Status: AC
  Administered 2021-05-28: 50 mL via INTRA_ARTERIAL
  Filled 2021-05-28: qty 100

## 2021-05-28 MED ORDER — IOHEXOL 300 MG/ML  SOLN
100.0000 mL | Freq: Once | INTRAMUSCULAR | Status: AC | PRN
  Administered 2021-05-28: 65 mL via INTRA_ARTERIAL

## 2021-05-28 MED ORDER — MIDAZOLAM HCL 2 MG/2ML IJ SOLN
INTRAMUSCULAR | Status: AC
  Filled 2021-05-28: qty 2

## 2021-05-28 MED ORDER — MIDAZOLAM HCL 2 MG/2ML IJ SOLN
INTRAMUSCULAR | Status: AC | PRN
  Administered 2021-05-28: 0.5 mg via INTRAVENOUS
  Administered 2021-05-28: 1 mg via INTRAVENOUS

## 2021-05-28 MED ORDER — HYDRALAZINE HCL 20 MG/ML IJ SOLN
INTRAMUSCULAR | Status: AC
  Filled 2021-05-28: qty 1

## 2021-05-28 MED ORDER — LIDOCAINE HCL (PF) 1 % IJ SOLN
INTRAMUSCULAR | Status: AC
  Filled 2021-05-28: qty 30

## 2021-05-28 MED ORDER — HYDRALAZINE HCL 20 MG/ML IJ SOLN
INTRAMUSCULAR | Status: AC | PRN
Start: 2021-05-28 — End: 2021-05-28
  Administered 2021-05-28: 5 mg via INTRAVENOUS

## 2021-05-28 NOTE — Progress Notes (Signed)
NAME:  Colan Laymon, MRN:  [id number], DOB:  [birth year], LOS: 5 ADMISSION DATE:  05/23/2021, CONSULTATION DATE:  05/28/2021 REFERRING MD:  Admitted from Rehab, CHIEF COMPLAINT:  altered mental status   History of Present Illness:  Mr. Pore is a 65 year old gentleman with recent intracranial hemorrhage who presents from rehab with worsening mental status and seizures.  He initially presented May 31 with new onset left-sided hemiparesis, right gaze, and left mild facial droop.  He was found to have acute intracranial hemorrhage of the posterior right frontal lobe.  He was medically managed with blood pressure control and discharged to inpatient rehab.  He had just finished his first day of intensive rehab.  At rehab he was sitting up, eating with assistance, able to talk.  This morning he was being fed breakfast with assistance and his nursing tech noted difficulty swallowing, facial droop, decreased mentation.  Code stroke was called.  CT head shows enlargement of his existing right intraparenchymal hemorrhage, with worsening midline shift.     Pertinent  Medical History  Hypertension Mild cognitive decline Intracranial hemorrhage  Significant Hospital Events: Including procedures, antibiotic start and stop dates in addition to other pertinent events   6/12 admitted from inpatient rehab for acute worsening of intracranial hemorrhage of the right frontal lobe 6/13 Hypertonic started. NIR cancelled due to neurostatus 6/15 Improving mentation. DVT right leg. Started on empiric antibiotics.    Interim History / Subjective:   Afebrile More awake, remains critically ill  Objective   Blood pressure (!) 149/91, pulse 92, temperature 98.2 F (36.8 C), temperature source Oral, resp. rate 14, weight 63.4 kg, SpO2 98 %. On RA        Intake/Output Summary (Last 24 hours) at 05/28/2021 0905 Last data filed at 05/28/2021 0800 Gross per 24 hour  Intake 1151.82 ml  Output 1275 ml  Net -123.18  ml    Filed Weights   05/25/21 0500 05/26/21 0359 05/28/21 0440  Weight: 66.5 kg 66 kg 63.4 kg    Examination: General:  in bed, no acute distress, appears comfortable HEENT: MM pink/moist, icteric/anicteric, atraumatic Neuro: Follows one-step commands, was able to tell me his name, left hemiplegia and facial droop, right UE/LE 4/5 , CV: S1S2, NSR on monitor, no m/r/g appreciated PULM: Clear breath sounds, no accessory muscle use GI: soft, bsx4 active, nondistended Extremities: warm/dry, no pretibial edema Skin: no rashes or lesions   Labs/imaging that I havepersonally reviewed  (right click and "Reselect all SmartList Selections" daily)  Chest x-ray 6/13 shows minimal left basilar atelectasis Labs show decrease leukocytosis from 20 4K to 17 K, persistent hyponatremia , minimal increase in creatinine to 1.1 , chloride coming down   Urine culture to 30 K staph aureus  CT head shows stable edema and expected evolution of hemorrhage.  Resolved Hospital Problem list     Assessment & Plan:   Mr. Alicea is 65 year old gentleman with recent intracranial hemorrhage , transferred to rehab, readmitted with worsening mental status changes and rebleeding of his right frontal lobe hemorrhage  Intracranial hemorrhage with associated cerebral edema causing midline shift Left hemiplegia and dysphagia Acute encephalopathy -improving , cognitive decline was being investigated as outpatient  Plan: - allow Na to drift down , on NS @ 50 -Plan is for cerebral angiogram today to rule out AVM  Right femoral DVT  Status post IVC filter  Leukocytosis with low procalcitonin  -No source of infection identified, doubt staph UTI, empiric ceftriaxone for 5 days , leukocytosis may  be all reactive HLD Hyperglycemia  PCCM will be available as needed  Best practice (right click and "Reselect all SmartList Selections" daily)  Diet:  Tube Feed   Pain/Anxiety/Delirium protocol (if indicated):  No VAP protocol (if indicated): Not indicated DVT prophylaxis: LMWH GI prophylaxis: N/A Glucose control:  SSI Yes Central venous access:  N/A Arterial line:  N/A Foley:  N/A Mobility:  bed rest  PT consulted: Yes Last date of multidisciplinary goals of care discussion [6/13 at bedside with wife] Wife updated at bedside 6/15. Code Status:  full code Disposition: ICU     Nariah Morgano V. Lanny [facility] 05/28/2021 9:05 AM  Pager: see AMION  If no response to pager , please call critical care on call (see AMION) until 7pm After 7:00 pm call Elink

## 2021-05-28 NOTE — Progress Notes (Addendum)
STROKE TEAM PROGRESS NOTE   SUBJECTIVE (INTERVAL HISTORY) His wife is at the bedside. Pt neuro stable, no acute event overnight. Dr. Corliss Skains will perform cerebral angiogram in am. No fever overnight, but tachycardia  OBJECTIVE Temp:  [97.6 F (36.4 C)-98.8 F (37.1 C)] 98.8 F (37.1 C) (06/17 1600) Pulse Rate:  [73-121] 119 (06/17 1600) Cardiac Rhythm: Sinus tachycardia (06/17 1145) Resp:  [13-32] 21 (06/17 1600) BP: (128-173)/(81-108) 158/88 (06/17 1600) SpO2:  [96 %-100 %] 97 % (06/17 1600) Weight:  [63.4 kg] 63.4 kg (06/17 0440)  Recent Labs  Lab 05/27/21 2320 05/28/21 0325 05/28/21 0748 05/28/21 1216 05/28/21 1544  GLUCAP 188* 145* 108* 123* 122*   Recent Labs  Lab 05/24/21 0134 05/24/21 0838 05/24/21 1604 05/24/21 1929 05/25/21 0141 05/25/21 0743 05/25/21 1429 05/25/21 2306 05/26/21 0335 05/26/21 0912 05/27/21 0300 05/27/21 1637 05/28/21 0450  NA 135   < > 141   < > 144  145   < > 154*   < > 158* 155* 156* 157* 157*  K 4.4  --   --   --  4.4  --   --   --  4.0  --  4.2  --  3.8  CL 96*  --   --   --  115*  --   --   --  121*  --  119*  --  114*  CO2 27  --   --   --  23  --   --   --  28  --  30  --  31  GLUCOSE 135*  --   --   --  200*  --   --   --  168*  --  93  --  134*  BUN 27*  --   --   --  29*  --   --   --  23  --  25*  --  26*  CREATININE 1.45*  --   --   --  1.03  --   --   --  1.02  --  0.98  --  1.09  CALCIUM 10.1  --   --   --  9.2  --   --   --  9.7  --  9.8  --  10.4*  MG 2.5*  --  2.5*  --  2.6*  --  2.5*  --   --   --   --   --   --   PHOS 3.6  --  3.2  --  2.1*  --  1.9*  --   --   --   --   --   --    < > = values in this interval not displayed.   No results for input(s): AST, ALT, ALKPHOS, BILITOT, PROT, ALBUMIN in the last 168 hours.  Recent Labs  Lab 05/24/21 0134 05/25/21 0141 05/26/21 0335 05/27/21 0300 05/28/21 0450  WBC 22.9* 20.4* 24.8* 23.9* 17.7*  HGB 16.3 14.7 14.0 14.3 15.6  HCT 49.6 44.1 43.9 44.6 49.7  MCV 90.0  90.9 94.4 93.3 95.4  PLT 309 268 360 425* 492*   No results for input(s): CKTOTAL, CKMB, CKMBINDEX, TROPONINI in the last 168 hours. No results for input(s): LABPROT, INR in the last 72 hours. No results for input(s): COLORURINE, LABSPEC, PHURINE, GLUCOSEU, HGBUR, BILIRUBINUR, KETONESUR, PROTEINUR, UROBILINOGEN, NITRITE, LEUKOCYTESUR in the last 72 hours.  Invalid input(s): APPERANCEUR      Component Value Date/Time   CHOL 232 (  H) 05/11/2021 1118   CHOL 204 (H) 09/11/2020 1449   TRIG 73 05/11/2021 1118   HDL 77 05/11/2021 1118   HDL 63 09/11/2020 1449   CHOLHDL 3.0 05/11/2021 1118   VLDL 15 05/11/2021 1118   LDLCALC 140 (H) 05/11/2021 1118   LDLCALC 132 (H) 09/11/2020 1449   LDLCALC 107 (H) 04/29/2020 0826   Lab Results  Component Value Date   HGBA1C 5.8 (H) 05/11/2021      Component Value Date/Time   LABOPIA NONE DETECTED 05/11/2021 1109   COCAINSCRNUR NONE DETECTED 05/11/2021 1109   LABBENZ NONE DETECTED 05/11/2021 1109   AMPHETMU NONE DETECTED 05/11/2021 1109   THCU NONE DETECTED 05/11/2021 1109   LABBARB NONE DETECTED 05/11/2021 1109    No results for input(s): ETH in the last 168 hours.   I have personally reviewed the radiological images below and agree with the radiology interpretations.  DG Abd 1 View  Result Date: 05/20/2021 CLINICAL DATA:  Abdominal pain. EXAM: ABDOMEN - 1 VIEW COMPARISON:  None. FINDINGS: Large amount of intestinal gas throughout small and large bowel suggesting ileus. There does not appear to be a large amount of fecal matter. No focal obstruction is evident. No free air. No abnormal calcifications or significant bone findings. IMPRESSION: Large amount of intestinal gas suggesting ileus. Electronically Signed   By: Paulina Fusi M.D.   On: 05/20/2021 19:25   CT HEAD WO CONTRAST  Result Date: 05/27/2021 CLINICAL DATA:  65 year old male with right hemisphere hemorrhage at code stroke presentation 05/11/2021. Subsequent encounter. EXAM: CT HEAD  WITHOUT CONTRAST TECHNIQUE: Contiguous axial images were obtained from the base of the skull through the vertex without intravenous contrast. COMPARISON:  Head CT 05/23/2021 and earlier. FINDINGS: Brain: Evolving right superior frontal lobe hemorrhage. Slowly fading hyperdense and mixed density blood products involving the superior and middle frontal gyri in an area of up to 48 x 35 x 50 mm (AP by transverse by CC). Continued regional edema and mass effect including effaced right lateral ventricle and leftward midline shift up to 7 mm at the midportion of the septum pellucidum. No ventriculomegaly. No intraventricular or significant extra-axial hemorrhage. Basilar cisterns remain patent. Stable gray-white matter differentiation elsewhere. No acute cortically based infarct. Vascular: Mild Calcified atherosclerosis at the skull base. Skull: Stable, negative. Sinuses/Orbits: Mild to moderate maxillary alveolar recess mucosal thickening. Otherwise paranasal sinuses and mastoids are stable and well aerated. Other: Right nasoenteric tube in place. Visualized orbits and scalp soft tissues are within normal limits. IMPRESSION: 1. Slowly evolving right superior frontal lobe hemorrhage, not significantly changed in size or configuration since 05/23/2021, with stable regional edema and mass effect. 2. No new intracranial abnormality. Electronically Signed   By: Odessa Fleming M.D.   On: 05/27/2021 06:24   CT HEAD WO CONTRAST  Result Date: 05/23/2021 CLINICAL DATA:  Follow-up intracerebral hemorrhage EXAM: CT HEAD WITHOUT CONTRAST TECHNIQUE: Contiguous axial images were obtained from the base of the skull through the vertex without intravenous contrast. COMPARISON:  CT 05/23/2021 FINDINGS: Brain: No significant interval change in the overall size of the intraparenchymal hemorrhagic component seen in the right frontal with some extensive surrounding hypo attenuating edematous changes. Locoregional mass effect with sulcal effacement  and effacement of the right lateral ventricle as well as stable subfalcine herniation with a right to left midline shift of approximately 14 mm. Slight medialization of the right uncus without clear transtentorial herniation is also unchanged from prior. Basal cisterns remain patent. No new sites of hemorrhage. No  new loss of gray-white differentiation. Vascular: No hyperdense vessel or unexpected calcification. Skull: Normal. Negative for fracture or focal lesion. EEG leads in place. Sinuses/Orbits: Mild mural thickening in the maxillary sinuses. Paranasal sinuses and mastoid air cells are otherwise predominantly clear. Included orbital structures are unremarkable. Other: None. IMPRESSION: 1. Stable appearance of the right frontal lobe intraparenchymal hemorrhage with surrounding cerebral edema, local mass effect as well as subfalcine midline shift of approximately 14 mm in slight medial ization of the right uncus, not significantly changed from comparison imaging. 2. No new sites of hemorrhage or new gray-white differentiation loss. Electronically Signed   By: Kreg Shropshire M.D.   On: 05/23/2021 23:58   CT HEAD WO CONTRAST  Result Date: 05/23/2021 CLINICAL DATA:  Follow-up right frontal parenchymal hemorrhage seen earlier today. EXAM: CT HEAD WITHOUT CONTRAST TECHNIQUE: Contiguous axial images were obtained from the base of the skull through the vertex without intravenous contrast. COMPARISON:  Earlier today. FINDINGS: Brain: No significant change in the previously described high right frontal lobe parenchymal hematoma with extensive surrounding edema and midline shift to the left. The midline shift currently measures 10 mm on image number 16/3. No interval ventricular enlargement. No new hemorrhage. Vascular: No hyperdense vessel or unexpected calcification. Skull: Normal. Negative for fracture or focal lesion. Sinuses/Orbits: Stable small left frontal sinus osteoma. Mild moderate left maxillary sinus mucosal  thickening without significant change. Unremarkable orbits. Other: Bilateral concha bullosa. IMPRESSION: 1. Stable large high right frontal parenchymal hematoma with extensive associated edema and midline shift to the left. 2. Stable left maxillary chronic sinusitis. Electronically Signed   By: Beckie Salts M.D.   On: 05/23/2021 18:00   CT HEAD WO CONTRAST  Result Date: 05/23/2021 CLINICAL DATA:  Cerebral hemorrhage, decreased responsiveness EXAM: CT HEAD WITHOUT CONTRAST TECHNIQUE: Contiguous axial images were obtained from the base of the skull through the vertex without intravenous contrast. COMPARISON:  Earlier same day FINDINGS: Brain: Parenchymal hematoma centered within the high right frontal lobe does not measure substantially changed from the prior study. Extensive surrounding edema is again noted with regional mass effect. Leftward midline shift measures similar at 14 mm at the level of the septum pellucidum. Ventricle caliber is unchanged with no new trapping. No significant central herniation. There is no new loss of gray-white differentiation. Intraventricular hemorrhage is identified. Vascular: No new findings. Skull: Unremarkable. Sinuses/Orbits: No acute finding. Other: None. IMPRESSION: No substantial change in right frontal parenchymal hemorrhage, edema, and mass effect including midline shift. No new ventricle trapping or acute infarction. Electronically Signed   By: Guadlupe Spanish M.D.   On: 05/23/2021 12:49   CT HEAD WO CONTRAST  Result Date: 05/11/2021 CLINICAL DATA:  Stroke.  Intracranial hemorrhage follow-up. EXAM: CT HEAD WITHOUT CONTRAST TECHNIQUE: Contiguous axial images were obtained from the base of the skull through the vertex without intravenous contrast. COMPARISON:  Same day head CT. FINDINGS: Brain: Mild interval increase in the size of the right frontal intraparenchymal hemorrhage, measuring 4.5 x 2.6 x 3.5 cm (previously 4.2 x 2.3 x 3.1 cm). Similar adjacent small volume  subarachnoid hemorrhage. A small amount of subdural hemorrhage in this region is also not excluded. Surrounding edema is similar. Similar local mass effect without midline shift. Basal cisterns are patent. No evidence of acute large vascular territory infarct elsewhere. Similar mild white matter hypodensities, likely chronic microvascular ischemic disease. No hydrocephalus. Vascular: No hyperdense vessel.  Calcific atherosclerosis. Skull: No acute fracture. Sinuses/Orbits: Small left frontal sinus osteoma. Mild right and moderate  left maxillary sinus mucosal thickening. Unremarkable orbits. Other: No mastoid effusions. IMPRESSION: Mild interval increase in the size of the right frontal intraparenchymal hemorrhage, measuring 4.5 x 2.6 x 3.5 cm (previously 4.2 x 2.3 x 3.1 cm). Similar adjacent small volume hemorrhage. Electronically Signed   By: Feliberto Harts Donald   On: 05/11/2021 17:05   MR BRAIN WO CONTRAST  Result Date: 05/12/2021 CLINICAL DATA:  65 year old male code stroke presentation with intra-axial right hemisphere hemorrhage. Hypertensive (194/104) on presentation. EXAM: MRI HEAD WITHOUT CONTRAST TECHNIQUE: Multiplanar, multiecho pulse sequences of the brain and surrounding structures were obtained without intravenous contrast. COMPARISON:  CT head CTA head and neck 05/11/2021 FINDINGS: Brain: Coronal T2 weighted imaging could not be obtained. And some of the exam is intermittently degraded by motion artifact despite repeated imaging attempts. T1 isointense intra-axial hemorrhage in the posterior right frontal lobe demonstrates a layering hematocrit level (series 13, image 19) and blood encompasses 43 by 38 x 33 mm (AP by transverse by CC) for an estimated volume of 26 mL. Regional edema. Mild regional mass effect. Trace subarachnoid hemorrhage also suspected on axial FLAIR imaging. Additionally, on SWI there is also chronic hemosiderin in the right parietal lobe on series 19 image 34. But no other  definite chronic blood products. Susceptibility related abnormal diffusion in the posterior right frontal lobe with no convincing larger area of restricted diffusion. No restricted diffusion elsewhere. No IVH or ventriculomegaly. Normal basilar cisterns. Negative pituitary and cervicomedullary junction. Wallace Cullens and white matter signal outside of the affected right hemisphere is largely normal for age with mild nonspecific white matter changes. Vascular: Major intracranial vascular flow voids are preserved. Skull and upper cervical spine: Visualized bone marrow signal is within normal limits. Grossly negative cervical spine. Sinuses/Orbits: Negative orbits. Mild to moderate maxillary sinus mucosal thickening. Other: Mastoids are clear. Grossly normal visible internal auditory structures. IMPRESSION: 1. Posterior right frontal lobe intra-axial hemorrhage with layering hematocrit level has not significantly changed (estimated blood volume of 26 mL). Surrounding edema and mild regional mass effect. Trace subarachnoid hemorrhage. 2. No larger underlying infarct is evident. But chronic hemosiderin in the right parietal lobe indicates a previous intra-axial hemorrhage in the region. 3. No other acute intracranial abnormality. Electronically Signed   By: Odessa Fleming M.D.   On: 05/12/2021 07:26   IR IVC FILTER PLMT / S&I Lenise Arena GUID/MOD SED  Result Date: 05/27/2021 INDICATION: 65 year old gentleman with lower extremity DVT and acute intracranial hemorrhage presents to IR for IVC filter placement EXAM: Retrievable IVC filter placement utilizing fluoroscopic and ultrasound guidance MEDICATIONS: None. ANESTHESIA/SEDATION: One mg IV Versed; 50 mcg IV Fentanyl Moderate Sedation Time:  12 minutes The patient was continuously monitored during the procedure by the interventional radiology nurse under my direct supervision. FLUOROSCOPY TIME:  Fluoroscopy Time: 1 minutes 6 seconds (9 mGy). COMPLICATIONS: None immediate. PROCEDURE: Informed  written consent was obtained from the patient after a thorough discussion of the procedural risks, benefits and alternatives. All questions were addressed. Maximal Sterile Barrier Technique was utilized including caps, mask, sterile gowns, sterile gloves, sterile drape, hand hygiene and skin antiseptic. A timeout was performed prior to the initiation of the procedure. Patient positioned supine on the angiography table. Right neck prepped and draped in usual sterile fashion. All elements of maximal sterile barrier were utilized including, cap, mask, sterile gown, sterile gloves, large sterile drape, hand scrubbing and 2% Chlorhexidine for skin cleaning. The right internal jugular vein was evaluated with ultrasound and shown to be patent. A  permanent ultrasound image was obtained and placed in the patient's medical record. Using sterile gel and a sterile probe cover, the right internal jugular vein was entered with a 21 ga needle during real time ultrasound guidance. 21 gauge needle exchanged for a transitional dilator set over 0.018 inch guidewire. Transitional dilator set exchanged for 10 fr sheath over 0.035 inch guidewire. Sheath placed to the infrarenal IVC and CO2 venogram performed to delineate the position of the renal veins. Retrievable Denali IVC filter deployed in the infrarenal location. Sheath removed and hemostasis achieved with manual compression. IMPRESSION: Retrievable Denali IVC filter placed. [name]: This IVC filter is potentially retrievable. The patient will be assessed for filter retrieval by Interventional Radiology in approximately 8-12 weeks. Further recommendations regarding filter retrieval, continued surveillance or declaration of device permanence, will be made at that time. Electronically Signed   By: Acquanetta Belling M.D.   On: 05/27/2021 16:38   IR US Guide Vasc Access Right  Result Date: 05/27/2021 INDICATION: 65 year old gentleman with lower extremity DVT and acute intracranial  hemorrhage presents to IR for IVC filter placement EXAM: Retrievable IVC filter placement utilizing fluoroscopic and ultrasound guidance MEDICATIONS: None. ANESTHESIA/SEDATION: One mg IV Versed; 50 mcg IV Fentanyl Moderate Sedation Time:  12 minutes The patient was continuously monitored during the procedure by the interventional radiology nurse under my direct supervision. FLUOROSCOPY TIME:  Fluoroscopy Time: 1 minutes 6 seconds (9 mGy). COMPLICATIONS: None immediate. PROCEDURE: Informed written consent was obtained from the patient after a thorough discussion of the procedural risks, benefits and alternatives. All questions were addressed. Maximal Sterile Barrier Technique was utilized including caps, mask, sterile gowns, sterile gloves, sterile drape, hand hygiene and skin antiseptic. A timeout was performed prior to the initiation of the procedure. Patient positioned supine on the angiography table. Right neck prepped and draped in usual sterile fashion. All elements of maximal sterile barrier were utilized including, cap, mask, sterile gown, sterile gloves, large sterile drape, hand scrubbing and 2% Chlorhexidine for skin cleaning. The right internal jugular vein was evaluated with ultrasound and shown to be patent. A permanent ultrasound image was obtained and placed in the patient's medical record. Using sterile gel and a sterile probe cover, the right internal jugular vein was entered with a 21 ga needle during real time ultrasound guidance. 21 gauge needle exchanged for a transitional dilator set over 0.018 inch guidewire. Transitional dilator set exchanged for 10 fr sheath over 0.035 inch guidewire. Sheath placed to the infrarenal IVC and CO2 venogram performed to delineate the position of the renal veins. Retrievable Denali IVC filter deployed in the infrarenal location. Sheath removed and hemostasis achieved with manual compression. IMPRESSION: Retrievable Denali IVC filter placed. [name]: This IVC filter  is potentially retrievable. The patient will be assessed for filter retrieval by Interventional Radiology in approximately 8-12 weeks. Further recommendations regarding filter retrieval, continued surveillance or declaration of device permanence, will be made at that time. Electronically Signed   By: Acquanetta Belling M.D.   On: 05/27/2021 16:38   DG CHEST PORT 1 VIEW  Result Date: 05/24/2021 CLINICAL DATA:  Leukocytosis EXAM: PORTABLE CHEST 1 VIEW COMPARISON:  04/28/15 FINDINGS: Cardiac shadow is within normal limits. Mild aortic calcifications are noted. The lungs are well aerated bilaterally with minimal left basilar atelectasis. No bony abnormality is seen. IMPRESSION: Minimal left basilar atelectasis. Electronically Signed   By: Alcide Clever M.D.   On: 05/24/2021 10:52   DG Abd Portable 1V  Result Date: 05/24/2021 CLINICAL DATA:  Feeding  tube placement EXAM: PORTABLE ABDOMEN - 1 VIEW COMPARISON:  05/20/2021 FINDINGS: Limited radiograph of the lower chest and upper abdomen was obtained for the purposes of enteric tube localization. Enteric tube is seen coursing below the diaphragm with distal tip terminating within the expected location of the gastric body. Air-filled large and small bowel loops throughout the abdomen, similar to prior. IMPRESSION: Enteric tube tip terminates within the gastric body. Electronically Signed   By: Duanne Guess D.O.   On: 05/24/2021 13:50   EEG adult  Result Date: 05/23/2021 Charlsie Quest, Donald     05/23/2021  4:11 PM Patient Name: Donald [name] MRN: [id number] Epilepsy Attending: Charlsie Quest Referring Physician/Provider: Dr Bing Neighbors Date: 05/23/2021 Duration: 25.36 mins Patient history:  64 y.o. male currently admitted for ICH on 05/11/21 developed somnolence and aphasia with R gaze deviation that resolved with ativan. EEG to evaluate for seizure Level of alertness: asleep AEDs during EEG study: LEV Technical aspects: This EEG study was done with scalp  electrodes positioned according to the 10-20 International system of electrode placement. Electrical activity was acquired at a sampling rate of  and reviewed with a high frequency filter of  and a low frequency filter of . EEG data were recorded continuously and digitally stored. Description: No posterior dominant rhythm was seen. Sleep was characterized by sleep spindles (12 to 14 Hz), maximal frontocentral region. EEG showed continuous generalized 3 to 6 Hz theta-delta slowing. There is an excessive amount of 15 to 18 Hz, 2-3 uV beta activity distributed symmetrically and diffusely.  Hyperventilation and photic stimulation were not performed.   ABNORMALITY - Continuous slow, generalized - Excessive beta, generalized IMPRESSION: This study is suggestive of moderate to severe diffuse encephalopathy, nonspecific etiology. The excessive beta activity seen in the background is most likely due to the effect of benzodiazepine and is a benign EEG pattern. No seizures or epileptiform discharges were seen throughout the recording. Priyanka Annabelle Harman   Overnight EEG with video  Result Date: 05/24/2021 Charlsie Quest, Donald     05/25/2021  9:42 AM Patient Name: Donald [name] MRN: [id number] Epilepsy Attending: Charlsie Quest Referring Physician/Provider: Dr Bing Neighbors Duration: 05/23/2021 1655 to 05/24/2021 1655  Patient history:  65 y.o. male currently admitted for ICH on 05/11/21 developed somnolence and aphasia with R gaze deviation that resolved with ativan. EEG to evaluate for seizure  Level of alertness: awake, asleep  AEDs during EEG study: LEV  Technical aspects: This EEG study was done with scalp electrodes positioned according to the 10-20 International system of electrode placement. Electrical activity was acquired at a sampling rate of  and reviewed with a high frequency filter of  and a low frequency filter of . EEG data were recorded continuously and digitally stored.  Description:  No posterior dominant rhythm was seen. Sleep was characterized by sleep spindles (12 to 14 Hz), asymmetry ( R<L) maximal frontocentral region. EEG showed continuous generalized and lateralized right hemisphere 3 to 6 Hz theta-delta slowing. Hyperventilation and photic stimulation were not performed.    ABNORMALITY - Continuous slow, generalized and lateralized right hemisphere - Spindle asymmetry, right<left  IMPRESSION: This study is suggestive of cortical dysfunction in right hemisphere likely secondary to underlying structural abnormality.  Additionally there is moderate to severe diffuse encephalopathy, nonspecific etiology. No seizures or definite epileptiform discharges were seen throughout the recording.   Charlsie Quest   MR MRV HEAD W WO CONTRAST  Result Date: 05/12/2021 CLINICAL DATA:  Intracranial hemorrhage.  EXAM: MR VENOGRAM HEAD WITHOUT AND WITH CONTRAST TECHNIQUE: Angiographic images of the intracranial venous structures were acquired using MRV technique without and with intravenous contrast. CONTRAST:  7mL GADAVIST GADOBUTROL 1 MMOL/ML IV SOLN COMPARISON:  Brain MRI performed earlier today 05/12/2021. Noncontrast head CT examinations 05/11/2021. CT angiogram head/neck 05/11/2021. FINDINGS: The superior sagittal sinus, internal cerebral veins, vein of Galen, straight sinus, transverse sinuses, sigmoid sinuses and visualized jugular veins are patent. No appreciable intracranial venous thrombosis. Hypoplastic right transverse and sigmoid dural venous sinuses. An acute parenchymal hemorrhage within the posterior right frontal lobe is grossly unchanged in size as compared to the brain MRI performed earlier today. There is mild curvilinear vascular enhancement overlying the site of hemorrhage, which may be reactive. Elsewhere, no abnormal intracranial enhancement is identified. IMPRESSION: No evidence of intracranial venous thrombosis. Non dominant right transverse and sigmoid dural venous sinuses.  Electronically Signed   By: Jackey Loge DO   On: 05/12/2021 11:27   ECHOCARDIOGRAM COMPLETE  Result Date: 05/11/2021    ECHOCARDIOGRAM REPORT   Patient Name:   [facility] Date of Exam: 05/11/2021 Medical Rec #:  [id number]        Height:       68.0 in Accession #:    [id number]       Weight:       155.2 lb Date of Birth:  1956-03-25         BSA:          1.835 m Patient Age:    65 years         BP:           115/77 mmHg Patient Gender: M                HR:           84 bpm. Exam Location:  Inpatient Procedure: 2D Echo, Cardiac Doppler and Color Doppler Indications:    Stroke I63.9  History:        Patient has no prior history of Echocardiogram examinations.  Sonographer:    Roosvelt Maser RDCS Referring Phys: [id number] Reyne Dumas [facility] IMPRESSIONS  1. Left ventricular ejection fraction, by estimation, is 65 to 70%. The left ventricle has normal function. The left ventricle has no regional wall motion abnormalities. Left ventricular diastolic parameters were normal.  2. Right ventricular systolic function is normal. The right ventricular size is normal. Tricuspid regurgitation signal is inadequate for assessing PA pressure.  3. The mitral valve is normal in structure. No evidence of mitral valve regurgitation.  4. The aortic valve was not well visualized. Aortic valve regurgitation is not visualized. No aortic stenosis is present.  5. The inferior vena cava is normal in size with greater than 50% respiratory variability, suggesting right atrial pressure of 3 mmHg. FINDINGS  Left Ventricle: Left ventricular ejection fraction, by estimation, is 65 to 70%. The left ventricle has normal function. The left ventricle has no regional wall motion abnormalities. The left ventricular internal cavity size was normal in size. There is  no left ventricular hypertrophy. Left ventricular diastolic parameters were normal. Right Ventricle: The right ventricular size is normal. No increase in right ventricular wall thickness. Right  ventricular systolic function is normal. Tricuspid regurgitation signal is inadequate for assessing PA pressure. Left Atrium: Left atrial size was normal in size. Right Atrium: Right atrial size was normal in size. Pericardium: There is no evidence of pericardial effusion. Mitral Valve: The mitral valve is normal in structure. No evidence  of mitral valve regurgitation. Tricuspid Valve: The tricuspid valve is normal in structure. Tricuspid valve regurgitation is not demonstrated. Aortic Valve: The aortic valve was not well visualized. Aortic valve regurgitation is not visualized. No aortic stenosis is present. Aortic valve mean gradient measures 3.0 mmHg. Aortic valve peak gradient measures 6.7 mmHg. Aortic valve area, by VTI measures 2.77 cm. Pulmonic Valve: The pulmonic valve was not well visualized. Pulmonic valve regurgitation is not visualized. Aorta: The aortic root and ascending aorta are structurally normal, with no evidence of dilitation. Venous: The inferior vena cava is normal in size with greater than 50% respiratory variability, suggesting right atrial pressure of 3 mmHg. IAS/Shunts: The interatrial septum was not well visualized.  LEFT VENTRICLE PLAX 2D LVIDd:         4.90 cm  Diastology LVIDs:         2.80 cm  LV e' medial:    8.38 cm/s LV PW:         0.80 cm  LV E/e' medial:  9.0 LV IVS:        0.90 cm  LV e' lateral:   9.14 cm/s LVOT diam:     1.90 cm  LV E/e' lateral: 8.2 LV SV:         69 LV SV Index:   37 LVOT Area:     2.84 cm  RIGHT VENTRICLE RV Basal diam:  2.90 cm LEFT ATRIUM           Index       RIGHT ATRIUM           Index LA diam:      3.10 cm 1.69 cm/m  RA Area:     11.20 cm LA Vol (A2C): 26.8 ml 14.61 ml/m RA Volume:   21.90 ml  11.94 ml/m LA Vol (A4C): 39.2 ml 21.36 ml/m  AORTIC VALVE AV Area (Vmax):    2.64 cm AV Area (Vmean):   2.59 cm AV Area (VTI):     2.77 cm AV Vmax:           129.00 cm/s AV Vmean:          86.700 cm/s AV VTI:            0.248 m AV Peak Grad:      6.7  mmHg AV Mean Grad:      3.0 mmHg LVOT Vmax:         120.00 cm/s LVOT Vmean:        79.200 cm/s LVOT VTI:          0.242 m LVOT/AV VTI ratio: 0.98  AORTA Ao Root diam: 2.50 cm Ao Asc diam:  3.30 cm MITRAL VALVE MV Area (PHT): 3.60 cm     SHUNTS MV Decel Time: 211 msec     Systemic VTI:  0.24 m MV E velocity: 75.30 cm/s   Systemic Diam: 1.90 cm MV A velocity: 107.00 cm/s MV E/A ratio:  0.70 Epifanio Lesches Donald Electronically signed by Epifanio Lesches Donald Signature Date/Time: 05/11/2021/5:28:15 PM    Final    CT HEAD CODE STROKE WO CONTRAST  Result Date: 05/23/2021 CLINICAL DATA:  Code stroke.  Acute stroke. EXAM: CT HEAD WITHOUT CONTRAST TECHNIQUE: Contiguous axial images were obtained from the base of the skull through the vertex without intravenous contrast. COMPARISON:  Head CT from 05/11/2021 FINDINGS: Brain: 5.5 x 2.8 cm hematoma in the subcortical high right frontal region with extensive vasogenic edema in the adjacent brain that is progressed.  There is worsening local mass effect with midline shift now measuring 13 mm. No entrapment. No cytotoxic edema seen. Vascular: No hyperdense vessel or unexpected calcification. Skull: Normal. Negative for fracture or focal lesion. Sinuses/Orbits: Negative Other: Critical Value/emergent results were called by telephone at the time of interpretation on 05/23/2021 at 9:10 am to provider [facility] , who verbally acknowledged these results. ASPECTS [facility] Stroke Program Early CT Score) Not scored in this setting IMPRESSION: Progressed right frontal hematoma and vasogenic edema with midline shift now measuring 13 mm. Electronically Signed   By: Marnee Spring M.D.   On: 05/23/2021 09:12   CT HEAD CODE STROKE WO CONTRAST  Result Date: 05/11/2021 CLINICAL DATA:  Code stroke. Left-sided weakness, nausea, and vomiting. EXAM: CT HEAD WITHOUT CONTRAST TECHNIQUE: Contiguous axial images were obtained from the base of the skull through the vertex without  intravenous contrast. COMPARISON:  None. FINDINGS: Brain: An acute parenchymal hemorrhage in the high posterior right frontal lobe measures 4.2 x 2.3 x 3.1 cm (approximate volume of 15 mL). There is mild surrounding edema without midline shift, and there is a small amount of adjacent subarachnoid hemorrhage. A very small amount of coexistent subdural hemorrhage is also not excluded in this region. No acute infarct is identified elsewhere. The ventricles are normal in size. Hypodensities in the cerebral white matter bilaterally are nonspecific but compatible with mild chronic small vessel ischemic disease. Vascular: No hyperdense vessel. Skull: No fracture or suspicious osseous lesion. Sinuses/Orbits: Small osteoma in the left frontal sinus. Mild right and moderate left maxillary sinus mucosal thickening. Clear mastoid air cells. Unremarkable orbits. Other: None. ASPECTS [facility] Stroke Program Early CT Score) Not scored in the presence of acute hemorrhage. IMPRESSION: Acute parenchymal hemorrhage in the posterior right frontal lobe with small amount of adjacent subarachnoid hemorrhage. These results were communicated to Dr. Iver Nestle at 9:08 am on 05/11/2021 by text page via the [facility] messaging system. Electronically Signed   By: Sebastian Ache M.D.   On: 05/11/2021 09:12   VAS [place] LOWER EXTREMITY VENOUS (DVT)  Result Date: 05/26/2021  Lower Venous DVT Study Patient Name:  DAGEN BEEVERS  Date of Exam:   05/26/2021 Medical Rec #: [id number]         Accession #:    [id number] Date of Birth: Oct 08, 1956          Patient Gender: M Patient Age:   65Y Exam Location:  [facility] Procedure:      VAS [place] LOWER EXTREMITY VENOUS (DVT) Referring Phys: [id number] Eliezer Champagne --------------------------------------------------------------------------------  Indications: Fever.  Limitations: Poor ultrasound/tissue interface. Comparison Study: No previous exams. Performing Technologist: Ernestene Mention RVT, RDMS  Examination  Guidelines: A complete evaluation includes B-mode imaging, spectral Doppler, color Doppler, and power Doppler as needed of all accessible portions of each vessel. Bilateral testing is considered an integral part of a complete examination. Limited examinations for reoccurring indications may be performed as noted. The reflux portion of the exam is performed with the patient in reverse Trendelenburg.  +---------+---------------+---------+-----------+----------+--------------+ RIGHT    CompressibilityPhasicitySpontaneityPropertiesThrombus Aging +---------+---------------+---------+-----------+----------+--------------+ CFV      Full           Yes      Yes                                 +---------+---------------+---------+-----------+----------+--------------+ SFJ      Full                                                        +---------+---------------+---------+-----------+----------+--------------+  FV Prox  Full           Yes      Yes                                 +---------+---------------+---------+-----------+----------+--------------+ FV Mid   Full           Yes      Yes                                 +---------+---------------+---------+-----------+----------+--------------+ FV DistalFull           Yes      Yes                                 +---------+---------------+---------+-----------+----------+--------------+ PFV      Full                                                        +---------+---------------+---------+-----------+----------+--------------+ POP      Full           Yes      Yes                                 +---------+---------------+---------+-----------+----------+--------------+ PTV      Full                                                        +---------+---------------+---------+-----------+----------+--------------+ PERO     Full                                                         +---------+---------------+---------+-----------+----------+--------------+   +---------+---------------+---------+-----------+----------+-------------------+ LEFT     CompressibilityPhasicitySpontaneityPropertiesThrombus Aging      +---------+---------------+---------+-----------+----------+-------------------+ CFV      Partial        Yes      Yes                  Acute- non                                                                occlusive           +---------+---------------+---------+-----------+----------+-------------------+ SFJ      Full                                                             +---------+---------------+---------+-----------+----------+-------------------+  FV Prox  Full           Yes      Yes                                      +---------+---------------+---------+-----------+----------+-------------------+ FV Mid   Full           Yes      Yes                                      +---------+---------------+---------+-----------+----------+-------------------+ FV DistalFull           Yes      Yes                                      +---------+---------------+---------+-----------+----------+-------------------+ PFV      Full                                                             +---------+---------------+---------+-----------+----------+-------------------+ POP      Full           Yes      Yes                                      +---------+---------------+---------+-----------+----------+-------------------+ PTV                                                   Not well visualized +---------+---------------+---------+-----------+----------+-------------------+ PERO     Full                                                             +---------+---------------+---------+-----------+----------+-------------------+ EIV                     Yes      Yes                  Patent by color and                                                        doppler             +---------+---------------+---------+-----------+----------+-------------------+     Summary: BILATERAL: - No evidence of superficial venous thrombosis in the lower extremities, bilaterally. -No evidence of popliteal cyst, bilaterally. RIGHT: - There is no evidence of deep vein thrombosis in the lower extremity.  LEFT: - Findings consistent with acute deep vein thrombosis involving the left common femoral vein.  *  See table(s) above for measurements and observations. Electronically signed by Coral ElseVance Brabham Donald on 05/26/2021 at 7:50:00 PM.    Final    CT ANGIO HEAD NECK W WO CM (CODE STROKE)  Result Date: 05/23/2021 CLINICAL DATA:  Enlarging hematoma EXAM: CT ANGIOGRAPHY HEAD AND NECK TECHNIQUE: Multidetector CT imaging of the head and neck was performed using the standard protocol during bolus administration of intravenous contrast. Multiplanar CT image reconstructions and MIPs were obtained to evaluate the vascular anatomy. Carotid stenosis measurements (when applicable) are obtained utilizing NASCET criteria, using the distal internal carotid diameter as the denominator. CONTRAST:  50mL OMNIPAQUE IOHEXOL 350 MG/ML SOLN COMPARISON:  CTA 05/11/2021 FINDINGS: CTA NECK FINDINGS Aortic arch: Normal Right carotid system: Widely patent vessels with smooth contour. No atheromatous changes Left carotid system: Widely patent vessels with smooth contour. No noted atheromatous changes Vertebral arteries: No proximal subclavian stenosis. The vertebral arteries are widely patent. No suspected vertebral beading when allowing for streak artifact. Skeleton: Multilevel degenerative disc narrowing and ridging canal and foraminal encroachment the lower cervical levels. Other neck: No evidence of inflammation or mass. Upper chest: Negative Review of the MIP images confirms the above findings CTA HEAD FINDINGS Anterior circulation: Major vessels are smooth and  widely patent. No branch occlusion, generalized beading, or aneurysm. There is a cortical branch at the right frontal parietal convexity which is not continuous beyond the level of the upper and lateral hematoma. No visible nidus or early enhancing veins. Posterior circulation: Vertebral and basilar arteries are smooth and widely patent. No branch occlusion, beading, or aneurysm. Venous sinuses: MRV was obtained previously. The veins are largely nonenhancing on this study. Anatomic variants: None significant Review of the MIP images confirms the above findings IMPRESSION: Seemingly truncated arterial cortical branch at the upper and lateral hematoma, without discrete vascular malformation or spot sign. Recommend follow-up after resolution of mass effect to evaluate for small AVM. Electronically Signed   By: Marnee SpringJonathon  Watts M.D.   On: 05/23/2021 09:54   CT ANGIO HEAD NECK W WO CM (CODE STROKE)  Result Date: 05/11/2021 CLINICAL DATA:  Stroke follow-up.  Left-sided weakness. EXAM: CT ANGIOGRAPHY HEAD AND NECK TECHNIQUE: Multidetector CT imaging of the head and neck was performed using the standard protocol during bolus administration of intravenous contrast. Multiplanar CT image reconstructions and MIPs were obtained to evaluate the vascular anatomy. Carotid stenosis measurements (when applicable) are obtained utilizing NASCET criteria, using the distal internal carotid diameter as the denominator. CONTRAST:  50mL OMNIPAQUE IOHEXOL 350 MG/ML SOLN COMPARISON:  Same day CT head. FINDINGS: CTA NECK FINDINGS Aortic arch: Great vessel origins are patent. Right carotid system: No evidence of dissection, stenosis (50% or greater) or occlusion. Mild apparent irregularity of the ICA at the skull base in a region of streak artifact. Left carotid system: No evidence of dissection, stenosis (50% or greater) or occlusion. Mild apparent irregularity of the ICA at the skull base in a region of streak artifact. Vertebral arteries:  Codominant. No evidence of dissection, stenosis (50% or greater) or occlusion. Skeleton: Moderate degenerative disc disease at at C5-C6 and C6-C7 with disc height loss, endplate sclerosis and posterior disc osteophyte complexes. Other neck: No acute abnormality Upper chest: Visualized lung apices are clear. Review of the MIP images confirms the above findings CTA HEAD FINDINGS Anterior circulation: No large vessel occlusion or proximal hemodynamically significant stenosis. Evaluation of the distal vasculature is limited due to venous contamination. No evidence of and arteriovenous malformation or aneurysm in the region of  intraparenchymal hemorrhage, although acute blood products limit evaluation. Posterior circulation: No large vessel occlusion or proximal hemodynamically significant stenosis. Venous sinuses: The superior sagittal sinus is narrowed in the region of hemorrhage, most likely from mass effect. Small right transverse and sigmoid sinuses. Review of the MIP images confirms the above findings IMPRESSION: CTA Head: 1. No large vessel occlusion or hemodynamically significant proximal arterial stenosis. 2. The superior sagittal sinus is narrowed in the region of hemorrhage with poorly visualized draining cortical veins in this region. While indeterminate, this may be secondary to mass effect given no definite/clear intraluminal thrombus. Given these findings and the location of hemorrhage; however, recommend low threshold for follow-up CTV or MRI with contrast to ensure stability and exclude worsening thrombosis. 3. No evidence of and arteriovenous malformation or aneurysm in the region of intraparenchymal hemorrhage, although acute blood products limit evaluation. Follow-up imaging after resolution of hemorrhage could provide further assessment if clinically indicated. CTA Neck: 1. No significant (greater than 50%) stenosis. 2. Mild apparent irregularity of bilateral ICAs at the skull base is favored  artifactual given streak artifact from dental amalgam in this region. Fibromuscular dysplasia is a differential consideration. Findings and recommendations discussed with Dr. Iver Nestle via telephone at 9:59 a.m. Electronically Signed   By: Feliberto Harts Donald   On: 05/11/2021 10:09     PHYSICAL EXAM  Temp:  [97.6 F (36.4 C)-98.8 F (37.1 C)] 98.8 F (37.1 C) (06/17 1600) Pulse Rate:  [73-121] 119 (06/17 1600) Resp:  [13-32] 21 (06/17 1600) BP: (128-173)/(81-108) 158/88 (06/17 1600) SpO2:  [96 %-100 %] 97 % (06/17 1600) Weight:  [63.4 kg] 63.4 kg (06/17 0440)  General - Well nourished, well developed, sleepy but arousable.  Ophthalmologic - fundi not visualized due to noncooperation.  Cardiovascular - Regular rhythm and mild tachycardia.  Neuro - sleepy but eyes open with voice, he was able to follow peripheral commands on the right hand and most central commands. Severe dysarthria, orientated to place and people age but not to time. Right gaze preference, not cross midline. Not blinking to visual threat bilaterally. Left facial droop. Tongue protrusion not cooperative. Right UE spontaneous movement against gravity and RLE some spontaneous movement but not against gravity, LUE and LLE plegic with increased muscle tone. Sensation, coordination not cooperative and gait not tested.   ASSESSMENT/[name] Mr. Jerimiah Wolman is a 65 y.o. male with history of dementia with behavioral disturbance and OSA on CPAP admitted for right gaze and left hemoplegia, left facial droop. 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 He was stabilized and sent to CIR. 6/12 found to be lethargic and drowsy, CT repeat showed increased cerebral edema and midline shift. EEG no seizure s/p ativan and keppra. He was transfer back to ICU for further management.   Encephalopathy  Improving Likely combination of cerebral edema, ? Seiziure, fever, leukocytosis and AKI Treat underlying condition as below Neuro check  closely  Cerebral edema  CT head 6/12 showed progression of right frontal ICH and cerebral edema with midline shift CTA head and neck 6/12 no discrete AVM or spot sign CT repeat 6/16 unchanged, no progression of midline shift or hematoma. On 3% saline 50->65->30 cc/h->off Na Q6h Na 135->140->144->151->158->155->156->157 Na goal 145-155  ? 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 No CSVT on venous phase CT repeat mildly increased left frontal ICH 05/12/21 MRI brain no significant change of ICH since last CT MRV Non dominant right transverse and sigmoid dural venous sinuses. CT repeat 6/16 unchanged, no progression of midline shift or hematoma. Cerebral angiogram no AVM or aneurysm, concerning for Poor to no sig opacification of RT transverse sinus in its entirety, ?Suspicious for thrombotic occlusion. MRV with and without pending 2D Echo  EF 65-70% LDL 140 HgbA1c 5.8 UDS neg Heparin subq for VTE prophylaxis No antithrombotic prior to admission, now on No antithrombotic given ICH Ongoing aggressive stroke risk factor management Therapy recommendations:  pending Disposition:  Pending   ?? CVST Cerebral angiogram no AVM or aneurysm, concerning for Poor to no sig opacification of RT transverse sinus in its entirety CVST vs. Venous compression due to cerebral edema MRV with and without pending  ?? 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  DVT LE venous Doppler showed left common femoral vein DVT Not a candidate for anticoagulation at this time S/p IVC filter 6/16  Dementia  Has appointment with [facility] neurology for LP and evaluation of dementia, but pt not able to go due to onset of ICH Delirium precaution Continue outpt follow up with [facility] neurolog[facility]  Hyperlipidemia Home meds:  none  LDL 140, goal < 70 Will consider low dose statin (lipitor 20) on discharge   Dysphagia  Speech to follow NPO  S/p cortrak on TF @ 50  Fever and leukocytosis Tmax 101.4->102.6->100.9->102.4->101.7->afebrile Leukocytosis WBC 7.6->12.4 ->8.1->7.8->22.9->20.4->24.8-> 23.9->17.7 CCM on board Continue monitoring UA neg CXR Minimal left basilar atelectasis On Rocephin now  Other Stroke Risk Factors Advanced age Obstructive sleep apnea, on CPAP at home  Other Active Problems AKI Cre 1.20->1.45->1.03->1.02->0.98->1.09 - on IVF and TF  Hospital day # 5  This patient is critically ill due to right frontal ICH, cerebral edema, mass-effect with midline shift, DVT, possible seizure, fever and leukocytosis and at significant risk of neurological worsening, death form sepsis, septic shock, status epilepticus, brain herniation, heart failure and respiratory failure. This patient's care requires constant monitoring of vital signs, hemodynamics, respiratory and cardiac monitoring, review of multiple databases, neurological assessment, discussion with family, other specialists and medical decision making of high complexity. I spent 35 minutes of neurocritical care time in the care of this patient.  I discussed with Dr. Corliss Skains and Dr. Vassie Loll.   Donald [name], Donald [name] Stroke Neurology 05/28/2021 5:14 PM    To contact Stroke Continuity provider, please refer to [email or web address]. After hours,  contact General Neurology

## 2021-05-28 NOTE — Progress Notes (Addendum)
Nutrition Follow-up  DOCUMENTATION CODES:   Non-severe (moderate) malnutrition in context of chronic illness  INTERVENTION:   Tube Feeding via Cortrak: Osmolite 1.5 at 50 ml/hr Pro-Source TF 45 mL BID Provides 97 g of protein, 1880 kcals and 912 mL of free water  NUTRITION DIAGNOSIS:   Moderate Malnutrition related to chronic illness as evidenced by mild fat depletion, mild muscle depletion, moderate muscle depletion.  Ongoing  GOAL:   Patient will meet greater than or equal to 90% of their needs  Met with TF  MONITOR:   TF tolerance, Diet advancement, Labs, Other (Comment)  REASON FOR ASSESSMENT:   Consult Enteral/tube feeding initiation and management  ASSESSMENT:   65 yo male transferred from CIR with worsening mental status and seizures. Initially presented to hospital on 5/31 with ICH. PMH includes HTN, cognitive delcine-being worked up for early onset dementia as outpatient.  6/13- cortrak tube placed 6/16- s/p IVC filter placement  Reviewed I/O's: -173 ml x 24 hours and +782 ml since admission  UOP: 1.3 L x 24 hours  Pt out of room at time of visit. Pt in radiology for cerebral angiogram to rule out AVM. TF currently being held for procedure.   Pt remains with cortrak tube for enteral access and was tolerating TF well prior to procedure.    Medications reviewed and include keppra and 0.9% sodium chloride infusion @ 50 ml/hr.   Lab Results  Component Value Date   HGBA1C 5.8 (H) 05/11/2021   PTA DM medications are .   Labs reviewed: Na: 157, CBGS: 106-145 (inpatient orders for glycemic control are 0-9 units insulin aspart every 4 hours).    Diet Order:   Diet Order             Diet NPO time specified  Diet effective now                   EDUCATION NEEDS:   Not appropriate for education at this time  Skin:  Skin Assessment: Skin Integrity Issues: Skin Integrity Issues:: Other (Comment) Other: rt neck puncture wound  Last BM:   05/27/21  Height:   Ht Readings from Last 1 Encounters:  05/20/21 $RemoveB'5\' 5"'yQkhuYaa$  (1.651 m)    Weight:   Wt Readings from Last 1 Encounters:  05/28/21 63.4 kg   BMI:  Body mass index is 23.26 kg/m.  Estimated Nutritional Needs:   Kcal:  1850-2050 kcals  Protein:  90-110 g  Fluid:  >/= 1.9 L    Loistine Chance, RD, LDN, [place] Registered Dietitian II Certified Diabetes Care and Education Specialist Please refer to [facility] for RD and/or RD on-call/weekend/after hours pager

## 2021-05-28 NOTE — Progress Notes (Signed)
OT Cancellation Note  Patient Details Name: Donald [name] MRN: [id number] DOB: [birth year]   Cancelled Treatment:    Reason Eval/Treat Not Completed: Patient at procedure or test/ unavailable (Angio. will follow up later time as schedule allows)  [facility] 05/28/2021, 1:52 PM Luisa Dago, OT/L   Acute OT Clinical Specialist Acute Rehabilitation Services Pager [phone] Office [phone]

## 2021-05-28 NOTE — Sedation Documentation (Addendum)
Called 4N RN Nehemiah Settle, will do bedside report. Continuing to hold manual pressure to right groin

## 2021-05-28 NOTE — Progress Notes (Signed)
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   Dr. Corliss Skains recommends MRA w/contrast as well as MRV w/ and w/o contrast in 3 months to follow up on these findings - I will place an order for IR schedulers to coordinate this with patient.  Further plans per neurology/primary team - please call NIR with questions or concerns.  Lynnette Caffey, PA-C

## 2021-05-28 NOTE — Progress Notes (Signed)
SLP Cancellation Note  Patient Details Name: Donald [name] MRN: [id number] DOB: Sep 12, 1956   Cancelled treatment:        Out of room for angiogram. Will continue efforts this afternoon   [name] 05/28/2021, 10:20 AM

## 2021-05-28 NOTE — Sedation Documentation (Addendum)
5 fr exoseal failed to deploy right groin. Holding manual pressure x 15-20 min

## 2021-05-28 NOTE — Procedures (Signed)
S/P 4 vessel cerebral arteriogram  RT CFA approach . Findings. Persistent  partial motion on almost all  diagnostic runs . 1.Poor to no sig opacification of RT transverse sinus in its entirety  ?Suspicious for thrombotic occlusion. 2. Grossly patent RT MCA anRT PCA branches with mass effect on the RT ACA A2 /A3 branches and Rt MCA distal M3/M4 branches  sec to overlying hematoma?edema. 3. No AV shunting or AVM or aneurysm identified. S.Jocelyne Reinertsen MD

## 2021-05-28 NOTE — Progress Notes (Signed)
PT Cancellation Note  Patient Details Name: Donald [name] MRN: [id number] DOB: 1956-01-14   Cancelled Treatment:    Reason Eval/Treat Not Completed: Active bedrest order. Orders for bedrest post sheath removal for 6 hours starting 05/28/21 at 13:49. Will plan to follow-up another day as appropriate.   Raymond Gurney, PT, DPT Acute Rehabilitation Services  Pager: [phone] Office: [phone]    Jewel Baize 05/28/2021, 4:47 PM

## 2021-05-28 NOTE — Sedation Documentation (Signed)
Patient transported to 4N ICU. Brooke Charity fundraiser at the bedside to receive report. Groin site assessed. Clean, dry and intact. No drainage noted from dressing. Soft to touch upon palpation, no hematoma noted. Distal pulse intact.

## 2021-05-28 NOTE — Progress Notes (Signed)
Referring Physician(s): Marvel Plan  Supervising Physician: Julieanne Cotton  Patient Status:  [facility] - In-pt  Chief Complaint: ICH  Subjective:  Patient alert, opens eyes to verbal cues, able to follow simple commands similar to yesterday's exam. His speech is difficult to understand but he does attempt to answer each question asked of him - he clearly says that the month is May. No family/staff at bedside today. Spoke with patient's wife Jayme Cloud today via phone who confirms she is still agreeable to proceed with diagnostic cerebral angiogram.  Allergies: Sulfa antibiotics  Medications: Prior to Admission medications   Medication Sig Start Date End Date Taking? Authorizing Provider  amLODipine (NORVASC) 10 MG tablet Take 1 tablet (10 mg total) by mouth daily. 05/20/21   Pokhrel, Rebekah Chesterfield, MD  atorvastatin (LIPITOR) 20 MG tablet Take 1 tablet (20 mg total) by mouth daily. 05/19/21 05/19/22  Pokhrel, Rebekah Chesterfield, MD  hydrochlorothiazide (MICROZIDE) 12.5 MG capsule Take 1 capsule (12.5 mg total) by mouth daily. 05/20/21   Pokhrel, Rebekah Chesterfield, MD  Multiple Vitamins-Minerals (MENS 50+ MULTI VITAMIN/MIN PO) Take 1 Dose by mouth in the morning and at bedtime.    [provider]  pantoprazole (PROTONIX) 40 MG tablet Take 1 tablet (40 mg total) by mouth daily. 05/20/21   Pokhrel, Rebekah Chesterfield, MD  polyethylene glycol (MIRALAX / GLYCOLAX) 17 g packet Take 17 g by mouth 2 (two) times daily. 05/19/21   Pokhrel, Rebekah Chesterfield, MD  senna-docusate (SENOKOT-S) 8.6-50 MG tablet Take 1 tablet by mouth 2 (two) times daily. 05/19/21   Pokhrel, Rebekah Chesterfield, MD     Vital Signs: BP (!) 149/94   Pulse 100   Temp 98.2 F (36.8 C) (Oral)   Resp 14   Wt 139 lb 12.4 oz (63.4 kg)   SpO2 98%   BMI 23.26 kg/m   Physical Exam Vitals and nursing note reviewed.  Constitutional:      General: He is not in acute distress. HENT:     Head: Normocephalic.  Cardiovascular:     Rate and Rhythm: Normal rate.  Pulmonary:     Effort:  Pulmonary effort is normal.  Abdominal:     Comments: (+) NG  Skin:    General: Skin is warm and dry.  Neurological:     Mental Status: He is alert.  Alert, awake, disoriented Speech is garbled and difficult to understand, but he does try to answer each question when asked. Able to follow simple commands. PERRL bilaterally EOMs without obvious nystagmus or subjective diplopia. Visual fields grossly in tact No obvious facial asymmetry. Tongue midline Motor power - unable to move left upper/left lower extremity, able to wiggle right toes and lift right leg minimally. Able to lift right arm to chest height. Pronator drift - not assessed. Fine motor and coordination - difficult to assess, appears grossly in tact Gait - not assessed Romberg - not assessed Heel to toe - not assessed    Imaging: CT HEAD WO CONTRAST  Result Date: 05/27/2021 CLINICAL DATA:  65 year old male with right hemisphere hemorrhage at code stroke presentation 05/11/2021. Subsequent encounter. EXAM: CT HEAD WITHOUT CONTRAST TECHNIQUE: Contiguous axial images were obtained from the base of the skull through the vertex without intravenous contrast. COMPARISON:  Head CT 05/23/2021 and earlier. FINDINGS: Brain: Evolving right superior frontal lobe hemorrhage. Slowly fading hyperdense and mixed density blood products involving the superior and middle frontal gyri in an area of up to 48 x 35 x 50 mm (AP by transverse by CC). Continued regional edema  and mass effect including effaced right lateral ventricle and leftward midline shift up to 7 mm at the midportion of the septum pellucidum. No ventriculomegaly. No intraventricular or significant extra-axial hemorrhage. Basilar cisterns remain patent. Stable gray-white matter differentiation elsewhere. No acute cortically based infarct. Vascular: Mild Calcified atherosclerosis at the skull base. Skull: Stable, negative. Sinuses/Orbits: Mild to moderate maxillary alveolar recess mucosal  thickening. Otherwise paranasal sinuses and mastoids are stable and well aerated. Other: Right nasoenteric tube in place. Visualized orbits and scalp soft tissues are within normal limits. IMPRESSION: 1. Slowly evolving right superior frontal lobe hemorrhage, not significantly changed in size or configuration since 05/23/2021, with stable regional edema and mass effect. 2. No new intracranial abnormality. Electronically Signed   By: Odessa FlemingH  Hall M.D.   On: 05/27/2021 06:24   IR IVC FILTER PLMT / S&I Lenise Arena/IMG GUID/MOD SED  Result Date: 05/27/2021 INDICATION: 65 year old gentleman with lower extremity DVT and acute intracranial hemorrhage presents to IR for IVC filter placement EXAM: Retrievable IVC filter placement utilizing fluoroscopic and ultrasound guidance MEDICATIONS: None. ANESTHESIA/SEDATION: One mg IV Versed; 50 mcg IV Fentanyl Moderate Sedation Time:  12 minutes The patient was continuously monitored during the procedure by the interventional radiology nurse under my direct supervision. FLUOROSCOPY TIME:  Fluoroscopy Time: 1 minutes 6 seconds (9 mGy). COMPLICATIONS: None immediate. PROCEDURE: Informed written consent was obtained from the patient after a thorough discussion of the procedural risks, benefits and alternatives. All questions were addressed. Maximal Sterile Barrier Technique was utilized including caps, mask, sterile gowns, sterile gloves, sterile drape, hand hygiene and skin antiseptic. A timeout was performed prior to the initiation of the procedure. Patient positioned supine on the angiography table. Right neck prepped and draped in usual sterile fashion. All elements of maximal sterile barrier were utilized including, cap, mask, sterile gown, sterile gloves, large sterile drape, hand scrubbing and 2% Chlorhexidine for skin cleaning. The right internal jugular vein was evaluated with ultrasound and shown to be patent. A permanent ultrasound image was obtained and placed in the patient's medical  record. Using sterile gel and a sterile probe cover, the right internal jugular vein was entered with a 21 ga needle during real time ultrasound guidance. 21 gauge needle exchanged for a transitional dilator set over 0.018 inch guidewire. Transitional dilator set exchanged for 10 fr sheath over 0.035 inch guidewire. Sheath placed to the infrarenal IVC and CO2 venogram performed to delineate the position of the renal veins. Retrievable Denali IVC filter deployed in the infrarenal location. Sheath removed and hemostasis achieved with manual compression. IMPRESSION: Retrievable Denali IVC filter placed. PLAN: This IVC filter is potentially retrievable. The patient will be assessed for filter retrieval by Interventional Radiology in approximately 8-12 weeks. Further recommendations regarding filter retrieval, continued surveillance or declaration of device permanence, will be made at that time. Electronically Signed   By: Acquanetta BellingFarhaan  Mir M.D.   On: 05/27/2021 16:38   IR US Guide Vasc Access Right  Result Date: 05/27/2021 INDICATION: 65 year old gentleman with lower extremity DVT and acute intracranial hemorrhage presents to IR for IVC filter placement EXAM: Retrievable IVC filter placement utilizing fluoroscopic and ultrasound guidance MEDICATIONS: None. ANESTHESIA/SEDATION: One mg IV Versed; 50 mcg IV Fentanyl Moderate Sedation Time:  12 minutes The patient was continuously monitored during the procedure by the interventional radiology nurse under my direct supervision. FLUOROSCOPY TIME:  Fluoroscopy Time: 1 minutes 6 seconds (9 mGy). COMPLICATIONS: None immediate. PROCEDURE: Informed written consent was obtained from the patient after a thorough discussion of the  procedural risks, benefits and alternatives. All questions were addressed. Maximal Sterile Barrier Technique was utilized including caps, mask, sterile gowns, sterile gloves, sterile drape, hand hygiene and skin antiseptic. A timeout was performed prior to  the initiation of the procedure. Patient positioned supine on the angiography table. Right neck prepped and draped in usual sterile fashion. All elements of maximal sterile barrier were utilized including, cap, mask, sterile gown, sterile gloves, large sterile drape, hand scrubbing and 2% Chlorhexidine for skin cleaning. The right internal jugular vein was evaluated with ultrasound and shown to be patent. A permanent ultrasound image was obtained and placed in the patient's medical record. Using sterile gel and a sterile probe cover, the right internal jugular vein was entered with a 21 ga needle during real time ultrasound guidance. 21 gauge needle exchanged for a transitional dilator set over 0.018 inch guidewire. Transitional dilator set exchanged for 10 fr sheath over 0.035 inch guidewire. Sheath placed to the infrarenal IVC and CO2 venogram performed to delineate the position of the renal veins. Retrievable Denali IVC filter deployed in the infrarenal location. Sheath removed and hemostasis achieved with manual compression. IMPRESSION: Retrievable Denali IVC filter placed. PLAN: This IVC filter is potentially retrievable. The patient will be assessed for filter retrieval by Interventional Radiology in approximately 8-12 weeks. Further recommendations regarding filter retrieval, continued surveillance or declaration of device permanence, will be made at that time. Electronically Signed   By: Acquanetta Belling M.D.   On: 05/27/2021 16:38   DG CHEST PORT 1 VIEW  Result Date: 05/24/2021 CLINICAL DATA:  Leukocytosis EXAM: PORTABLE CHEST 1 VIEW COMPARISON:  04/28/15 FINDINGS: Cardiac shadow is within normal limits. Mild aortic calcifications are noted. The lungs are well aerated bilaterally with minimal left basilar atelectasis. No bony abnormality is seen. IMPRESSION: Minimal left basilar atelectasis. Electronically Signed   By: Alcide Clever M.D.   On: 05/24/2021 10:52   DG Abd Portable 1V  Result Date:  05/24/2021 CLINICAL DATA:  Feeding tube placement EXAM: PORTABLE ABDOMEN - 1 VIEW COMPARISON:  05/20/2021 FINDINGS: Limited radiograph of the lower chest and upper abdomen was obtained for the purposes of enteric tube localization. Enteric tube is seen coursing below the diaphragm with distal tip terminating within the expected location of the gastric body. Air-filled large and small bowel loops throughout the abdomen, similar to prior. IMPRESSION: Enteric tube tip terminates within the gastric body. Electronically Signed   By: Duanne Guess D.O.   On: 05/24/2021 13:50   Overnight EEG with video  Result Date: 05/24/2021 Charlsie Quest, MD     05/25/2021  9:42 AM Patient Name: Yohannes Waibel MRN: [id number] Epilepsy Attending: Charlsie Quest Referring Physician/Provider: Dr Bing Neighbors Duration: 05/23/2021 1655 to 05/24/2021 1655  Patient history:  65 y.o. male currently admitted for ICH on 05/11/21 developed somnolence and aphasia with R gaze deviation that resolved with ativan. EEG to evaluate for seizure  Level of alertness: awake, asleep  AEDs during EEG study: LEV  Technical aspects: This EEG study was done with scalp electrodes positioned according to the 10-20 International system of electrode placement. Electrical activity was acquired at a sampling rate of  and reviewed with a high frequency filter of  and a low frequency filter of . EEG data were recorded continuously and digitally stored.  Description: No posterior dominant rhythm was seen. Sleep was characterized by sleep spindles (12 to 14 Hz), asymmetry ( R<L) maximal frontocentral region. EEG showed continuous generalized and lateralized right hemisphere 3 to  6 Hz theta-delta slowing. Hyperventilation and photic stimulation were not performed.    ABNORMALITY - Continuous slow, generalized and lateralized right hemisphere - Spindle asymmetry, right<left  IMPRESSION: This study is suggestive of cortical dysfunction in right  hemisphere likely secondary to underlying structural abnormality.  Additionally there is moderate to severe diffuse encephalopathy, nonspecific etiology. No seizures or definite epileptiform discharges were seen throughout the recording.   Priyanka O Yadav   VAS [place] LOWER EXTREMITY VENOUS (DVT)  Result Date: 05/26/2021  Lower Venous DVT Study Patient Name:  JEREMEY BASCOM  Date of Exam:   05/26/2021 Medical Rec #: [id number]         Accession #:    [id number] Date of Birth: 08-05-1956          Patient Gender: M Patient Age:   65Y Exam Location:  [facility] Procedure:      VAS [place] LOWER EXTREMITY VENOUS (DVT) Referring Phys: [id number] Eliezer Champagne --------------------------------------------------------------------------------  Indications: Fever.  Limitations: Poor ultrasound/tissue interface. Comparison Study: No previous exams. Performing Technologist: Ernestene Mention RVT, RDMS  Examination Guidelines: A complete evaluation includes B-mode imaging, spectral Doppler, color Doppler, and power Doppler as needed of all accessible portions of each vessel. Bilateral testing is considered an integral part of a complete examination. Limited examinations for reoccurring indications may be performed as noted. The reflux portion of the exam is performed with the patient in reverse Trendelenburg.  +---------+---------------+---------+-----------+----------+--------------+ RIGHT    CompressibilityPhasicitySpontaneityPropertiesThrombus Aging +---------+---------------+---------+-----------+----------+--------------+ CFV      Full           Yes      Yes                                 +---------+---------------+---------+-----------+----------+--------------+ SFJ      Full                                                        +---------+---------------+---------+-----------+----------+--------------+ FV Prox  Full           Yes      Yes                                  +---------+---------------+---------+-----------+----------+--------------+ FV Mid   Full           Yes      Yes                                 +---------+---------------+---------+-----------+----------+--------------+ FV DistalFull           Yes      Yes                                 +---------+---------------+---------+-----------+----------+--------------+ PFV      Full                                                        +---------+---------------+---------+-----------+----------+--------------+ POP  Full           Yes      Yes                                 +---------+---------------+---------+-----------+----------+--------------+ PTV      Full                                                        +---------+---------------+---------+-----------+----------+--------------+ PERO     Full                                                        +---------+---------------+---------+-----------+----------+--------------+   +---------+---------------+---------+-----------+----------+-------------------+ LEFT     CompressibilityPhasicitySpontaneityPropertiesThrombus Aging      +---------+---------------+---------+-----------+----------+-------------------+ CFV      Partial        Yes      Yes                  Acute- non                                                                occlusive           +---------+---------------+---------+-----------+----------+-------------------+ SFJ      Full                                                             +---------+---------------+---------+-----------+----------+-------------------+ FV Prox  Full           Yes      Yes                                      +---------+---------------+---------+-----------+----------+-------------------+ FV Mid   Full           Yes      Yes                                       +---------+---------------+---------+-----------+----------+-------------------+ FV DistalFull           Yes      Yes                                      +---------+---------------+---------+-----------+----------+-------------------+ PFV      Full                                                             +---------+---------------+---------+-----------+----------+-------------------+  POP      Full           Yes      Yes                                      +---------+---------------+---------+-----------+----------+-------------------+ PTV                                                   Not well visualized +---------+---------------+---------+-----------+----------+-------------------+ PERO     Full                                                             +---------+---------------+---------+-----------+----------+-------------------+ EIV                     Yes      Yes                  Patent by color and                                                       doppler             +---------+---------------+---------+-----------+----------+-------------------+     Summary: BILATERAL: - No evidence of superficial venous thrombosis in the lower extremities, bilaterally. -No evidence of popliteal cyst, bilaterally. RIGHT: - There is no evidence of deep vein thrombosis in the lower extremity.  LEFT: - Findings consistent with acute deep vein thrombosis involving the left common femoral vein.  *See table(s) above for measurements and observations. Electronically signed by Coral Else MD on 05/26/2021 at 7:50:00 PM.    Final     Labs:  CBC: Recent Labs    05/25/21 0141 05/26/21 0335 05/27/21 0300 05/28/21 0450  WBC 20.4* 24.8* 23.9* 17.7*  HGB 14.7 14.0 14.3 15.6  HCT 44.1 43.9 44.6 49.7  PLT 268 360 425* 492*    COAGS: Recent Labs    05/11/21 0851  INR 1.0  APTT 26    BMP: Recent Labs    09/11/20 1449 05/11/21 0851 05/25/21 0141  05/25/21 0743 05/26/21 0335 05/26/21 0912 05/27/21 0300 05/27/21 1637 05/28/21 0450  NA 143   < > 144  145   < > 158* 155* 156* 157* 157*  K 4.3   < > 4.4  --  4.0  --  4.2  --  3.8  CL 103   < > 115*  --  121*  --  119*  --  114*  CO2 26   < > 23  --  28  --  30  --  31  GLUCOSE 96   < > 200*  --  168*  --  93  --  134*  BUN 10   < > 29*  --  23  --  25*  --  26*  CALCIUM 10.1   < > 9.2  --  9.7  --  9.8  --  10.4*  CREATININE 1.17   < > 1.03  --  1.02  --  0.98  --  1.09  GFRNONAA 66   < > >60  --  >60  --  >60  --  >60  GFRAA 76  --   --   --   --   --   --   --   --    < > = values in this interval not displayed.    LIVER FUNCTION TESTS: Recent Labs    09/11/20 1449 05/11/21 0851 05/12/21 0341 05/21/21 0532  BILITOT 0.5 0.7 0.8 0.9  AST 24 23 24 27   ALT 23 17 19 25   ALKPHOS 124* 94 108 100  PROT 7.6 6.7 7.5 7.7  ALBUMIN 4.8 3.9 4.3 4.0    Assessment and Plan:  65 y/o M admitted for ICH and cerebral edema - NIR consulted for possible diagnostic cerebral angiogram due to worsening ICH with concern for possible AVM which was pending improvement in patient's mental status. On exam today patient is again more alert and interactive, able to follow simple commands and open eyes. Given continued improvement in mental status will plan to proceed with diagnostic cerebral angiogram today in NIR with Dr. .  Reviewed procedure again today with patient's wife [name] via phone who again agrees to proceed with diagnostic cerebral angiogram.  Please call with questions or concerns.  Electronically Signed: 77, PA-C 05/28/2021, 9:13 AM   I spent a total of 15 Minutes at the the patient's bedside AND on the patient's hospital floor or unit, greater than 50% of which was counseling/coordinating care for diagnostic cerebral angiogram/ICH follow up.

## 2021-05-29 DIAGNOSIS — I611 Nontraumatic intracerebral hemorrhage in hemisphere, cortical: Secondary | ICD-10-CM

## 2021-05-29 LAB — GLUCOSE, CAPILLARY
Glucose-Capillary: 155 mg/dL — ABNORMAL HIGH (ref 70–99)
Glucose-Capillary: 166 mg/dL — ABNORMAL HIGH (ref 70–99)
Glucose-Capillary: 197 mg/dL — ABNORMAL HIGH (ref 70–99)
Glucose-Capillary: 199 mg/dL — ABNORMAL HIGH (ref 70–99)
Glucose-Capillary: 207 mg/dL — ABNORMAL HIGH (ref 70–99)
Glucose-Capillary: 234 mg/dL — ABNORMAL HIGH (ref 70–99)

## 2021-05-29 LAB — CBC
HCT: 43.8 % (ref 39.0–52.0)
Hemoglobin: 13.5 g/dL (ref 13.0–17.0)
MCH: 29.4 pg (ref 26.0–34.0)
MCHC: 30.8 g/dL (ref 30.0–36.0)
MCV: 95.4 fL (ref 80.0–100.0)
Platelets: 434 10*3/uL — ABNORMAL HIGH (ref 150–400)
RBC: 4.59 MIL/uL (ref 4.22–5.81)
RDW: 14.9 % (ref 11.5–15.5)
WBC: 12.9 10*3/uL — ABNORMAL HIGH (ref 4.0–10.5)
nRBC: 0 % (ref 0.0–0.2)

## 2021-05-29 LAB — BASIC METABOLIC PANEL
Anion gap: 7 (ref 5–15)
BUN: 25 mg/dL — ABNORMAL HIGH (ref 8–23)
CO2: 27 mmol/L (ref 22–32)
Calcium: 9.4 mg/dL (ref 8.9–10.3)
Chloride: 124 mmol/L — ABNORMAL HIGH (ref 98–111)
Creatinine, Ser: 0.98 mg/dL (ref 0.61–1.24)
GFR, Estimated: >60 mL/min (ref >60)
Glucose, Bld: 250 mg/dL — ABNORMAL HIGH (ref 70–99)
Potassium: 3.6 mmol/L (ref 3.5–5.1)
Sodium: 158 mmol/L — ABNORMAL HIGH (ref 135–145)

## 2021-05-29 LAB — SODIUM: Sodium: 160 mmol/L — ABNORMAL HIGH (ref 135–145)

## 2021-05-29 MED ORDER — FREE WATER
300.0000 mL | Freq: Four times a day (QID) | Status: DC
  Administered 2021-05-29 – 2021-05-31 (×8): 300 mL

## 2021-05-29 NOTE — Evaluation (Addendum)
Physical Therapy Evaluation Patient Details Name: Donald [name] MRN: [id number] DOB: 07/07/1956 Today's Date: 05/29/2021   History of Present Illness  The pt is a 65 yo male re-admitted from CIR on 6/12 due to sudden onset of worsening mental status, seizures, and facial droop. Repeat CT revealed enlargement of original R frontal ICH with worsening midline shift. Significant PMH includes: memory deficits, OSA.Marland Kitchen   Clinical Impression  Pt in bed upon arrival of PT, agreeable to evaluation at this time. Prior to current admission, pt working with therapy at [facility], requiring assist of 2 for transfers to [facility]. The pt now presents with limitations in functional mobility, strength, command following, stability, and awareness due to above dx, and will continue to benefit from skilled PT to address these deficits. The pt was able to demo good initiation of rolling to L by reaching with RUE and pulling on therapist, but required totalA of 2 to complete transition to sitting, or to complete squat-pivot to reposition in bed. Pt with decreased verbalizations and increased lethargy after 25 min of rolling and sitting EOB, VSS under limits through session. Will benefit from continued skilled PT acutely as well as hopeful for return to CIR level therapies when medically stable.      Follow Up Recommendations CIR    Equipment Recommendations  3in1 (PT);Wheelchair (measurements PT);Wheelchair cushion (measurements PT);Hospital bed    Recommendations for Other Services Rehab consult     Precautions / Restrictions Precautions Precautions: Fall;Other (comment) Precaution Comments: L hemiparesis, L inattention, pusher to the L, cognitive deficits, cortrak Restrictions Weight Bearing Restrictions: No      Mobility  Bed Mobility Overal bed mobility: Needs Assistance Bed Mobility: Supine to Sit;Sit to Supine Rolling: Mod assist;+2 for physical assistance;+2 for safety/equipment Sidelying to sit: Total  assist;+2 for physical assistance;+2 for safety/equipment   Sit to supine: Total assist;+2 for safety/equipment   General bed mobility comments: assist for rolling side to side    Transfers Overall transfer level: Needs assistance Equipment used: 2 person hand held assist Transfers: Sit to/from [employer] Transfers Sit to Stand: Total assist;+2 physical assistance;+2 safety/equipment   Squat pivot transfers: Max assist;+2 physical assistance     General transfer comment: squat pivot to move hips towards EOB, required use of bed pad, pt more fatigued, slumped forward onto therapists for support  Ambulation/Gait             General Gait Details: unable   Modified Rankin (Stroke Patients Only) Modified Rankin (Stroke Patients Only) Pre-Morbid Rankin Score: No symptoms Modified Rankin: Severe disability     Balance Overall balance assessment: Needs assistance Sitting-balance support: Feet supported;Single extremity supported Sitting balance-Leahy Scale: Poor Sitting balance - Comments: Pusher to the L Postural control: Left lateral lean;Posterior lean Standing balance support: Bilateral upper extremity supported Standing balance-Leahy Scale: Zero Standing balance comment: posterior and lean to L.                             Pertinent Vitals/Pain Pain Assessment: Faces Faces Pain Scale: Hurts a little bit Pain Location: R neck Pain Descriptors / Indicators: Grimacing Pain Intervention(s): Monitored during session    Home Living Family/patient expects to be discharged to:: Private residence Living Arrangements: Spouse/significant other Available Help at Discharge: Family;Available 24 hours/day Type of Home: House Home Access: Stairs to enter Entrance Stairs-Rails: Left Entrance Stairs-Number of Steps: 4 Home Layout: One level Home Equipment: Shower seat - built in;Grab bars - toilet;Grab  bars - tub/shower Additional Comments: has access to RW  from pt's MIL    Prior Function Level of Independence: Independent         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     Hand Dominance   Dominant Hand: Right    Extremity/Trunk Assessment   Upper Extremity Assessment Upper Extremity Assessment: Defer to OT evaluation RUE Deficits / Details: AAROM, WFLs strength 4/5 LUE Deficits / Details: No grasp strength or hand AROM. No active movement at scapula and noting increased tone. LUE Sensation: decreased light touch;decreased proprioception LUE Coordination: decreased fine motor;decreased gross motor    Lower Extremity Assessment Lower Extremity Assessment: Generalized weakness;LLE deficits/detail;RLE deficits/detail RLE Deficits / Details: grossly 3+/5, able to move PROM against gravity, follwed commands with 2-4 seconds increased time. RLE Sensation: WNL LLE Deficits / Details: no active movement LLE Sensation:  (pt accurately stating he felt presence or abscence of PT touch 3/5 times)    Cervical / Trunk Assessment Cervical / Trunk Assessment: Normal  Communication   Communication: Expressive difficulties  Cognition Arousal/Alertness: Lethargic Behavior During Therapy: Flat affect Overall Cognitive Status: Impaired/Different from baseline Area of Impairment: Awareness;Problem solving;Safety/judgement;Attention;Following commands                 Orientation Level: Disoriented to;Situation;Place Current Attention Level: Sustained Memory: Decreased short-term memory Following Commands: Follows one step commands with increased time Safety/Judgement: Decreased awareness of safety;Decreased awareness of deficits Awareness: Intellectual Problem Solving: Requires verbal cues;Requires tactile cues;Slow processing;Decreased initiation;Difficulty sequencing General Comments: Pt asking to "call for a ride because he is going home."      General Comments  General comments (skin integrity, edema, etc.): BP stable 128/73 throughout exertion.Pt with brief episode of fatigue and less alertness, but pt awoke and wanted to got back in bed. BP stable, HR increased to 117 BPM    Exercises General Exercises - Lower Extremity Long Arc Quad: AROM;Right;5 reps;Seated Other Exercises Other Exercises: seated truncal sitting balance tasks with RUE reaching Other Exercises: lateral lean to RUE and return to midline Other Exercises: max cues to encourage cervical rotation, pt unable to generate active movement to L   Assessment/Plan    PT Assessment Patient needs continued PT services  PT Problem List Decreased strength;Decreased activity tolerance;Decreased balance;Decreased mobility;Decreased cognition;Decreased safety awareness;Impaired sensation;Impaired tone       PT Treatment Interventions DME instruction;Gait training;Functional mobility training;Therapeutic exercise;Therapeutic activities;Balance training;Neuromuscular re-education;Cognitive remediation;Patient/family education;Wheelchair mobility training    PT Goals (Current goals can be found in the Care Plan section)  Acute Rehab PT Goals Patient Stated Goal: spouse wants pt to return to CIR PT Goal Formulation: With patient/family Time For Goal Achievement: 06/12/21 Potential to Achieve Goals: Good    Frequency Min 4X/week        Co-evaluation PT/OT/SLP Co-Evaluation/Treatment: Yes Reason for Co-Treatment: Complexity of the patient's impairments (multi-system involvement);Necessary to address cognition/behavior during functional activity;For patient/therapist safety;To address functional/ADL transfers PT goals addressed during session: Mobility/safety with mobility;Balance;Strengthening/ROM OT goals addressed during session: ADL's and self-care;Strengthening/ROM       AM-PAC PT "6 Clicks" Mobility  Outcome Measure Help needed turning from your back to your side while in a flat bed  without using bedrails?: Total Help needed moving from lying on your back to sitting on the side of a flat bed without using bedrails?: Total Help needed moving to and from a bed to a chair (including a wheelchair)?: Total Help needed standing  up from a chair using your arms (e.g., wheelchair or bedside chair)?: Total Help needed to walk in hospital room?: Total Help needed climbing 3-5 steps with a railing? : Total 6 Click Score: 6    End of Session Equipment Utilized During Treatment: Gait belt Activity Tolerance: Patient tolerated treatment well;Patient limited by fatigue Patient left: in bed;with call bell/phone within reach;with bed alarm set;with family/visitor present Nurse Communication: Mobility status PT Visit Diagnosis: Other abnormalities of gait and mobility (R26.89);Muscle weakness (generalized) (M62.81);Hemiplegia and hemiparesis Hemiplegia - Right/Left: Left Hemiplegia - dominant/non-dominant: Non-dominant Hemiplegia - caused by: Nontraumatic intracerebral hemorrhage    Time: 1012-1052 PT Time Calculation (min) (ACUTE ONLY): 40 min   Charges:   PT Evaluation $PT Eval High Complexity: 1 High          Rolm Baptise, PT, DPT   Acute Rehabilitation Department Pager #: [phone]  Gaetana Michaelis 05/29/2021, 12:25 PM

## 2021-05-29 NOTE — Progress Notes (Signed)
STROKE TEAM PROGRESS NOTE   SUBJECTIVE (INTERVAL HISTORY) Donald [name] is at the bedside. Pt neuro stable, no acute event overnight. Dr. Corliss Skains did perform cerebral angiogram yesterday which showed no evidence of AV malformation, aneurysm or vascular obstruction.  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  Patient appears more alert and interactive.  He is afebrile.  Vital signs are stable.  OBJECTIVE Temp:  [98.1 F (36.7 C)-99.3 F (37.4 C)] 98.1 F (36.7 C) (06/18 1200) Pulse Rate:  [98-120] 111 (06/18 1300) Cardiac Rhythm: Sinus tachycardia (06/18 0800) Resp:  [15-23] 17 (06/18 1300) BP: (132-176)/(81-100) 143/98 (06/18 1300) SpO2:  [95 %-99 %] 96 % (06/18 1300) Weight:  [65.6 kg] 65.6 kg (06/18 0400)  Recent Labs  Lab 05/28/21 1931 05/28/21 2321 05/29/21 0323 05/29/21 0800 05/29/21 1159  GLUCAP 211* 194* 199* 234* 207*   Recent Labs  Lab 05/24/21 0134 05/24/21 0838 05/24/21 1604 05/24/21 1929 05/25/21 0141 05/25/21 0743 05/25/21 1429 05/25/21 2306 05/26/21 0335 05/26/21 0912 05/27/21 0300 05/27/21 1637 05/28/21 0450 05/28/21 1636 05/29/21 0617  NA 135   < > 141   < > 144  145   < > 154*   < > 158*   < > 156* 157* 157* 158* 158*  K 4.4  --   --   --  4.4  --   --   --  4.0  --  4.2  --  3.8  --  3.6  CL 96*  --   --   --  115*  --   --   --  121*  --  119*  --  114*  --  124*  CO2 27  --   --   --  23  --   --   --  28  --  30  --  31  --  27  GLUCOSE 135*  --   --   --  200*  --   --   --  168*  --  93  --  134*  --  250*  BUN 27*  --   --   --  29*  --   --   --  23  --  25*  --  26*  --  25*  CREATININE 1.45*  --   --   --  1.03  --   --   --  1.02  --  0.98  --  1.09  --  0.98  CALCIUM 10.1  --   --   --  9.2  --   --   --  9.7  --  9.8  --  10.4*  --  9.4  MG 2.5*  --  2.5*  --  2.6*  --  2.5*  --   --   --   --   --   --   --   --   PHOS 3.6  --  3.2  --  2.1*  --  1.9*  --   --   --   --   --   --   --   --    < > =  values in this interval not displayed.   No results for input(s): AST, ALT, ALKPHOS, BILITOT, PROT, ALBUMIN in the last 168 hours.  Recent Labs  Lab 05/25/21 0141 05/26/21 0335 05/27/21 0300 05/28/21 0450 05/29/21 0617  WBC 20.4* 24.8* 23.9* 17.7* 12.9*  HGB 14.7 14.0 14.3 15.6 13.5  HCT 44.1 43.9 44.6 49.7 43.8  MCV 90.9 94.4 93.3 95.4 95.4  PLT 268 360 425* 492* 434*   No results for input(s): CKTOTAL, CKMB, CKMBINDEX, TROPONINI in the last 168 hours. No results for input(s): LABPROT, INR in the last 72 hours. No results for input(s): COLORURINE, LABSPEC, PHURINE, GLUCOSEU, HGBUR, BILIRUBINUR, KETONESUR, PROTEINUR, UROBILINOGEN, NITRITE, LEUKOCYTESUR in the last 72 hours.  Invalid input(s): APPERANCEUR      Component Value Date/Time   CHOL 232 (H) 05/11/2021 1118   CHOL 204 (H) 09/11/2020 1449   TRIG 73 05/11/2021 1118   HDL 77 05/11/2021 1118   HDL 63 09/11/2020 1449   CHOLHDL 3.0 05/11/2021 1118   VLDL 15 05/11/2021 1118   LDLCALC 140 (H) 05/11/2021 1118   LDLCALC 132 (H) 09/11/2020 1449   LDLCALC 107 (H) 04/29/2020 0826   Lab Results  Component Value Date   HGBA1C 5.8 (H) 05/11/2021      Component Value Date/Time   LABOPIA NONE DETECTED 05/11/2021 1109   COCAINSCRNUR NONE DETECTED 05/11/2021 1109   LABBENZ NONE DETECTED 05/11/2021 1109   AMPHETMU NONE DETECTED 05/11/2021 1109   THCU NONE DETECTED 05/11/2021 1109   LABBARB NONE DETECTED 05/11/2021 1109    No results for input(s): ETH in the last 168 hours.   I have personally reviewed the radiological images below and agree with the radiology interpretations.  DG Abd 1 View  Result Date: 05/20/2021 CLINICAL DATA:  Abdominal pain. EXAM: ABDOMEN - 1 VIEW COMPARISON:  None. FINDINGS: Large amount of intestinal gas throughout small and large bowel suggesting ileus. There does not appear to be a large amount of fecal matter. No focal obstruction is evident. No free air. No abnormal calcifications or significant  bone findings. IMPRESSION: Large amount of intestinal gas suggesting ileus. Electronically Signed   By: Paulina FusiMark  Shogry M.D.   On: 05/20/2021 19:25   CT HEAD WO CONTRAST  Result Date: 05/27/2021 CLINICAL DATA:  65 year old male with right hemisphere hemorrhage at code stroke presentation 05/11/2021. Subsequent encounter. EXAM: CT HEAD WITHOUT CONTRAST TECHNIQUE: Contiguous axial images were obtained from the base of the skull through the vertex without intravenous contrast. COMPARISON:  Head CT 05/23/2021 and earlier. FINDINGS: Brain: Evolving right superior frontal lobe hemorrhage. Slowly fading hyperdense and mixed density blood products involving the superior and middle frontal gyri in an area of up to 48 x 35 x 50 mm (AP by transverse by CC). Continued regional edema and mass effect including effaced right lateral ventricle and leftward midline shift up to 7 mm at the midportion of the septum pellucidum. No ventriculomegaly. No intraventricular or significant extra-axial hemorrhage. Basilar cisterns remain patent. Stable gray-white matter differentiation elsewhere. No acute cortically based infarct. Vascular: Mild Calcified atherosclerosis at the skull base. Skull: Stable, negative. Sinuses/Orbits: Mild to moderate maxillary alveolar recess mucosal thickening. Otherwise paranasal sinuses and mastoids are stable and well aerated. Other: Right nasoenteric tube in place. Visualized orbits and scalp soft tissues are within normal limits. IMPRESSION: 1. Slowly evolving right superior frontal lobe hemorrhage, not significantly changed in size or configuration since 05/23/2021, with stable regional edema and mass effect. 2. No new intracranial abnormality. Electronically Signed   By: Odessa FlemingH  Hall M.D.   On: 05/27/2021 06:24   CT HEAD WO CONTRAST  Result Date: 05/23/2021 CLINICAL DATA:  Follow-up intracerebral hemorrhage EXAM: CT HEAD WITHOUT CONTRAST TECHNIQUE: Contiguous axial images were obtained from the base of the  skull through the vertex without intravenous contrast. COMPARISON:  CT 05/23/2021 FINDINGS:  Brain: No significant interval change in the overall size of the intraparenchymal hemorrhagic component seen in the right frontal with some extensive surrounding hypo attenuating edematous changes. Locoregional mass effect with sulcal effacement and effacement of the right lateral ventricle as well as stable subfalcine herniation with a right to left midline shift of approximately 14 mm. Slight medialization of the right uncus without clear transtentorial herniation is also unchanged from prior. Basal cisterns remain patent. No new sites of hemorrhage. No new loss of gray-white differentiation. Vascular: No hyperdense vessel or unexpected calcification. Skull: Normal. Negative for fracture or focal lesion. EEG leads in place. Sinuses/Orbits: Mild mural thickening in the maxillary sinuses. Paranasal sinuses and mastoid air cells are otherwise predominantly clear. Included orbital structures are unremarkable. Other: None. IMPRESSION: 1. Stable appearance of the right frontal lobe intraparenchymal hemorrhage with surrounding cerebral edema, local mass effect as well as subfalcine midline shift of approximately 14 mm in slight medial ization of the right uncus, not significantly changed from comparison imaging. 2. No new sites of hemorrhage or new gray-white differentiation loss. Electronically Signed   By: Kreg Shropshire M.D.   On: 05/23/2021 23:58   CT HEAD WO CONTRAST  Result Date: 05/23/2021 CLINICAL DATA:  Follow-up right frontal parenchymal hemorrhage seen earlier today. EXAM: CT HEAD WITHOUT CONTRAST TECHNIQUE: Contiguous axial images were obtained from the base of the skull through the vertex without intravenous contrast. COMPARISON:  Earlier today. FINDINGS: Brain: No significant change in the previously described high right frontal lobe parenchymal hematoma with extensive surrounding edema and midline shift to the  left. The midline shift currently measures 10 mm on image number 16/3. No interval ventricular enlargement. No new hemorrhage. Vascular: No hyperdense vessel or unexpected calcification. Skull: Normal. Negative for fracture or focal lesion. Sinuses/Orbits: Stable small left frontal sinus osteoma. Mild moderate left maxillary sinus mucosal thickening without significant change. Unremarkable orbits. Other: Bilateral concha bullosa. IMPRESSION: 1. Stable large high right frontal parenchymal hematoma with extensive associated edema and midline shift to the left. 2. Stable left maxillary chronic sinusitis. Electronically Signed   By: Beckie Salts M.D.   On: 05/23/2021 18:00   CT HEAD WO CONTRAST  Result Date: 05/23/2021 CLINICAL DATA:  Cerebral hemorrhage, decreased responsiveness EXAM: CT HEAD WITHOUT CONTRAST TECHNIQUE: Contiguous axial images were obtained from the base of the skull through the vertex without intravenous contrast. COMPARISON:  Earlier same day FINDINGS: Brain: Parenchymal hematoma centered within the high right frontal lobe does not measure substantially changed from the prior study. Extensive surrounding edema is again noted with regional mass effect. Leftward midline shift measures similar at 14 mm at the level of the septum pellucidum. Ventricle caliber is unchanged with no new trapping. No significant central herniation. There is no new loss of gray-white differentiation. Intraventricular hemorrhage is identified. Vascular: No new findings. Skull: Unremarkable. Sinuses/Orbits: No acute finding. Other: None. IMPRESSION: No substantial change in right frontal parenchymal hemorrhage, edema, and mass effect including midline shift. No new ventricle trapping or acute infarction. Electronically Signed   By: Guadlupe Spanish M.D.   On: 05/23/2021 12:49   CT HEAD WO CONTRAST  Result Date: 05/11/2021 CLINICAL DATA:  Stroke.  Intracranial hemorrhage follow-up. EXAM: CT HEAD WITHOUT CONTRAST TECHNIQUE:  Contiguous axial images were obtained from the base of the skull through the vertex without intravenous contrast. COMPARISON:  Same day head CT. FINDINGS: Brain: Mild interval increase in the size of the right frontal intraparenchymal hemorrhage, measuring 4.5 x 2.6 x 3.5 cm (previously  4.2 x 2.3 x 3.1 cm). Similar adjacent small volume subarachnoid hemorrhage. A small amount of subdural hemorrhage in this region is also not excluded. Surrounding edema is similar. Similar local mass effect without midline shift. Basal cisterns are patent. No evidence of acute large vascular territory infarct elsewhere. Similar mild white matter hypodensities, likely chronic microvascular ischemic disease. No hydrocephalus. Vascular: No hyperdense vessel.  Calcific atherosclerosis. Skull: No acute fracture. Sinuses/Orbits: Small left frontal sinus osteoma. Mild right and moderate left maxillary sinus mucosal thickening. Unremarkable orbits. Other: No mastoid effusions. IMPRESSION: Mild interval increase in the size of the right frontal intraparenchymal hemorrhage, measuring 4.5 x 2.6 x 3.5 cm (previously 4.2 x 2.3 x 3.1 cm). Similar adjacent small volume hemorrhage. Electronically Signed   By: Feliberto Harts MD   On: 05/11/2021 17:05   MR BRAIN WO CONTRAST  Result Date: 05/12/2021 CLINICAL DATA:  65 year old male code stroke presentation with intra-axial right hemisphere hemorrhage. Hypertensive (194/104) on presentation. EXAM: MRI HEAD WITHOUT CONTRAST TECHNIQUE: Multiplanar, multiecho pulse sequences of the brain and surrounding structures were obtained without intravenous contrast. COMPARISON:  CT head CTA head and neck 05/11/2021 FINDINGS: Brain: Coronal T2 weighted imaging could not be obtained. And some of the exam is intermittently degraded by motion artifact despite repeated imaging attempts. T1 isointense intra-axial hemorrhage in the posterior right frontal lobe demonstrates a layering hematocrit level (series 13,  image 19) and blood encompasses 43 by 38 x 33 mm (AP by transverse by CC) for an estimated volume of 26 mL. Regional edema. Mild regional mass effect. Trace subarachnoid hemorrhage also suspected on axial FLAIR imaging. Additionally, on SWI there is also chronic hemosiderin in the right parietal lobe on series 19 image 34. But no other definite chronic blood products. Susceptibility related abnormal diffusion in the posterior right frontal lobe with no convincing larger area of restricted diffusion. No restricted diffusion elsewhere. No IVH or ventriculomegaly. Normal basilar cisterns. Negative pituitary and cervicomedullary junction. Wallace Cullens and white matter signal outside of the affected right hemisphere is largely normal for age with mild nonspecific white matter changes. Vascular: Major intracranial vascular flow voids are preserved. Skull and upper cervical spine: Visualized bone marrow signal is within normal limits. Grossly negative cervical spine. Sinuses/Orbits: Negative orbits. Mild to moderate maxillary sinus mucosal thickening. Other: Mastoids are clear. Grossly normal visible internal auditory structures. IMPRESSION: 1. Posterior right frontal lobe intra-axial hemorrhage with layering hematocrit level has not significantly changed (estimated blood volume of 26 mL). Surrounding edema and mild regional mass effect. Trace subarachnoid hemorrhage. 2. No larger underlying infarct is evident. But chronic hemosiderin in the right parietal lobe indicates a previous intra-axial hemorrhage in the region. 3. No other acute intracranial abnormality. Electronically Signed   By: Odessa Fleming M.D.   On: 05/12/2021 07:26   IR IVC FILTER PLMT / S&I Lenise Arena GUID/MOD SED  Result Date: 05/27/2021 INDICATION: 65 year old gentleman with lower extremity DVT and acute intracranial hemorrhage presents to IR for IVC filter placement EXAM: Retrievable IVC filter placement utilizing fluoroscopic and ultrasound guidance MEDICATIONS: None.  ANESTHESIA/SEDATION: One mg IV Versed; 50 mcg IV Fentanyl Moderate Sedation Time:  12 minutes The patient was continuously monitored during the procedure by the interventional radiology nurse under my direct supervision. FLUOROSCOPY TIME:  Fluoroscopy Time: 1 minutes 6 seconds (9 mGy). COMPLICATIONS: None immediate. PROCEDURE: Informed written consent was obtained from the patient after a thorough discussion of the procedural risks, benefits and alternatives. All questions were addressed. Maximal Sterile Barrier Technique was utilized  including caps, mask, sterile gowns, sterile gloves, sterile drape, hand hygiene and skin antiseptic. A timeout was performed prior to the initiation of the procedure. Patient positioned supine on the angiography table. Right neck prepped and draped in usual sterile fashion. All elements of maximal sterile barrier were utilized including, cap, mask, sterile gown, sterile gloves, large sterile drape, hand scrubbing and 2% Chlorhexidine for skin cleaning. The right internal jugular vein was evaluated with ultrasound and shown to be patent. A permanent ultrasound image was obtained and placed in the patient's medical record. Using sterile gel and a sterile probe cover, the right internal jugular vein was entered with a 21 ga needle during real time ultrasound guidance. 21 gauge needle exchanged for a transitional dilator set over 0.018 inch guidewire. Transitional dilator set exchanged for 10 fr sheath over 0.035 inch guidewire. Sheath placed to the infrarenal IVC and CO2 venogram performed to delineate the position of the renal veins. Retrievable Denali IVC filter deployed in the infrarenal location. Sheath removed and hemostasis achieved with manual compression. IMPRESSION: Retrievable Denali IVC filter placed. PLAN: This IVC filter is potentially retrievable. The patient will be assessed for filter retrieval by Interventional Radiology in approximately 8-12 weeks. Further  recommendations regarding filter retrieval, continued surveillance or declaration of device permanence, will be made at that time. Electronically Signed   By: Acquanetta Belling M.D.   On: 05/27/2021 16:38   IR US Guide Vasc Access Right  Result Date: 05/27/2021 INDICATION: 65 year old gentleman with lower extremity DVT and acute intracranial hemorrhage presents to IR for IVC filter placement EXAM: Retrievable IVC filter placement utilizing fluoroscopic and ultrasound guidance MEDICATIONS: None. ANESTHESIA/SEDATION: One mg IV Versed; 50 mcg IV Fentanyl Moderate Sedation Time:  12 minutes The patient was continuously monitored during the procedure by the interventional radiology nurse under my direct supervision. FLUOROSCOPY TIME:  Fluoroscopy Time: 1 minutes 6 seconds (9 mGy). COMPLICATIONS: None immediate. PROCEDURE: Informed written consent was obtained from the patient after a thorough discussion of the procedural risks, benefits and alternatives. All questions were addressed. Maximal Sterile Barrier Technique was utilized including caps, mask, sterile gowns, sterile gloves, sterile drape, hand hygiene and skin antiseptic. A timeout was performed prior to the initiation of the procedure. Patient positioned supine on the angiography table. Right neck prepped and draped in usual sterile fashion. All elements of maximal sterile barrier were utilized including, cap, mask, sterile gown, sterile gloves, large sterile drape, hand scrubbing and 2% Chlorhexidine for skin cleaning. The right internal jugular vein was evaluated with ultrasound and shown to be patent. A permanent ultrasound image was obtained and placed in the patient's medical record. Using sterile gel and a sterile probe cover, the right internal jugular vein was entered with a 21 ga needle during real time ultrasound guidance. 21 gauge needle exchanged for a transitional dilator set over 0.018 inch guidewire. Transitional dilator set exchanged for 10 fr  sheath over 0.035 inch guidewire. Sheath placed to the infrarenal IVC and CO2 venogram performed to delineate the position of the renal veins. Retrievable Denali IVC filter deployed in the infrarenal location. Sheath removed and hemostasis achieved with manual compression. IMPRESSION: Retrievable Denali IVC filter placed. PLAN: This IVC filter is potentially retrievable. The patient will be assessed for filter retrieval by Interventional Radiology in approximately 8-12 weeks. Further recommendations regarding filter retrieval, continued surveillance or declaration of device permanence, will be made at that time. Electronically Signed   By: Acquanetta Belling M.D.   On: 05/27/2021 16:38  DG CHEST PORT 1 VIEW  Result Date: 05/24/2021 CLINICAL DATA:  Leukocytosis EXAM: PORTABLE CHEST 1 VIEW COMPARISON:  04/28/15 FINDINGS: Cardiac shadow is within normal limits. Mild aortic calcifications are noted. The lungs are well aerated bilaterally with minimal left basilar atelectasis. No bony abnormality is seen. IMPRESSION: Minimal left basilar atelectasis. Electronically Signed   By: Alcide Clever M.D.   On: 05/24/2021 10:52   DG Abd Portable 1V  Result Date: 05/24/2021 CLINICAL DATA:  Feeding tube placement EXAM: PORTABLE ABDOMEN - 1 VIEW COMPARISON:  05/20/2021 FINDINGS: Limited radiograph of the lower chest and upper abdomen was obtained for the purposes of enteric tube localization. Enteric tube is seen coursing below the diaphragm with distal tip terminating within the expected location of the gastric body. Air-filled large and small bowel loops throughout the abdomen, similar to prior. IMPRESSION: Enteric tube tip terminates within the gastric body. Electronically Signed   By: Duanne Guess D.O.   On: 05/24/2021 13:50   EEG adult  Result Date: 05/23/2021 Charlsie Quest, MD     05/23/2021  4:11 PM Patient Name: Nickolus Wadding MRN: [id number] Epilepsy Attending: Charlsie Quest Referring Physician/Provider: Dr  Bing Neighbors Date: 05/23/2021 Duration: 25.36 mins Patient history:  65 y.o. male currently admitted for ICH on 05/11/21 developed somnolence and aphasia with R gaze deviation that resolved with ativan. EEG to evaluate for seizure Level of alertness: asleep AEDs during EEG study: LEV Technical aspects: This EEG study was done with scalp electrodes positioned according to the 10-20 International system of electrode placement. Electrical activity was acquired at a sampling rate of 500Hz  and reviewed with a high frequency filter of 70Hz  and a low frequency filter of 1Hz . EEG data were recorded continuously and digitally stored. Description: No posterior dominant rhythm was seen. Sleep was characterized by sleep spindles (12 to 14 Hz), maximal frontocentral region. EEG showed continuous generalized 3 to 6 Hz theta-delta slowing. There is an excessive amount of 15 to 18 Hz, 2-3 uV beta activity distributed symmetrically and diffusely.  Hyperventilation and photic stimulation were not performed.   ABNORMALITY - Continuous slow, generalized - Excessive beta, generalized IMPRESSION: This study is suggestive of moderate to severe diffuse encephalopathy, nonspecific etiology. The excessive beta activity seen in the background is most likely due to the effect of benzodiazepine and is a benign EEG pattern. No seizures or epileptiform discharges were seen throughout the recording. Priyanka Annabelle Harman   Overnight EEG with video  Result Date: 05/24/2021 Charlsie Quest, MD     05/25/2021  9:42 AM Patient Name: Kalyn Dimattia MRN: [id number] Epilepsy Attending: Charlsie Quest Referring Physician/Provider: Dr Bing Neighbors Duration: 05/23/2021 1655 to 05/24/2021 1655  Patient history:  65 y.o. male currently admitted for ICH on 05/11/21 developed somnolence and aphasia with R gaze deviation that resolved with ativan. EEG to evaluate for seizure  Level of alertness: awake, asleep  AEDs during EEG study: LEV  Technical aspects: This  EEG study was done with scalp electrodes positioned according to the 10-20 International system of electrode placement. Electrical activity was acquired at a sampling rate of 500Hz  and reviewed with a high frequency filter of 70Hz  and a low frequency filter of 1Hz . EEG data were recorded continuously and digitally stored.  Description: No posterior dominant rhythm was seen. Sleep was characterized by sleep spindles (12 to 14 Hz), asymmetry ( R<L) maximal frontocentral region. EEG showed continuous generalized and lateralized right hemisphere 3 to 6 Hz theta-delta slowing. Hyperventilation and photic  stimulation were not performed.    ABNORMALITY - Continuous slow, generalized and lateralized right hemisphere - Spindle asymmetry, right<left  IMPRESSION: This study is suggestive of cortical dysfunction in right hemisphere likely secondary to underlying structural abnormality.  Additionally there is moderate to severe diffuse encephalopathy, nonspecific etiology. No seizures or definite epileptiform discharges were seen throughout the recording.   Charlsie Quest   MR MRV HEAD W WO CONTRAST  Result Date: 05/12/2021 CLINICAL DATA:  Intracranial hemorrhage. EXAM: MR VENOGRAM HEAD WITHOUT AND WITH CONTRAST TECHNIQUE: Angiographic images of the intracranial venous structures were acquired using MRV technique without and with intravenous contrast. CONTRAST:  7mL GADAVIST GADOBUTROL 1 MMOL/ML IV SOLN COMPARISON:  Brain MRI performed earlier today 05/12/2021. Noncontrast head CT examinations 05/11/2021. CT angiogram head/neck 05/11/2021. FINDINGS: The superior sagittal sinus, internal cerebral veins, vein of Galen, straight sinus, transverse sinuses, sigmoid sinuses and visualized jugular veins are patent. No appreciable intracranial venous thrombosis. Hypoplastic right transverse and sigmoid dural venous sinuses. An acute parenchymal hemorrhage within the posterior right frontal lobe is grossly unchanged in size as  compared to the brain MRI performed earlier today. There is mild curvilinear vascular enhancement overlying the site of hemorrhage, which may be reactive. Elsewhere, no abnormal intracranial enhancement is identified. IMPRESSION: No evidence of intracranial venous thrombosis. Non dominant right transverse and sigmoid dural venous sinuses. Electronically Signed   By: Jackey Loge DO   On: 05/12/2021 11:27   ECHOCARDIOGRAM COMPLETE  Result Date: 05/11/2021    ECHOCARDIOGRAM REPORT   Patient Name:   [facility] Date of Exam: 05/11/2021 Medical Rec #:  [id number]        Height:       68.0 in Accession #:    [id number]       Weight:       155.2 lb Date of Birth:  February 12, 1956         BSA:          1.835 m Patient Age:    65 years         BP:           115/77 mmHg Patient Gender: M                HR:           84 bpm. Exam Location:  Inpatient Procedure: 2D Echo, Cardiac Doppler and Color Doppler Indications:    Stroke I63.9  History:        Patient has no prior history of Echocardiogram examinations.  Sonographer:    Roosvelt Maser RDCS Referring Phys: [id number] Reyne Dumas [facility] IMPRESSIONS  1. Left ventricular ejection fraction, by estimation, is 65 to 70%. The left ventricle has normal function. The left ventricle has no regional wall motion abnormalities. Left ventricular diastolic parameters were normal.  2. Right ventricular systolic function is normal. The right ventricular size is normal. Tricuspid regurgitation signal is inadequate for assessing PA pressure.  3. The mitral valve is normal in structure. No evidence of mitral valve regurgitation.  4. The aortic valve was not well visualized. Aortic valve regurgitation is not visualized. No aortic stenosis is present.  5. The inferior vena cava is normal in size with greater than 50% respiratory variability, suggesting right atrial pressure of 3 mmHg. FINDINGS  Left Ventricle: Left ventricular ejection fraction, by estimation, is 65 to 70%. The left ventricle has  normal function. The left ventricle has no regional wall motion abnormalities. The left ventricular internal cavity size was normal  in size. There is  no left ventricular hypertrophy. Left ventricular diastolic parameters were normal. Right Ventricle: The right ventricular size is normal. No increase in right ventricular wall thickness. Right ventricular systolic function is normal. Tricuspid regurgitation signal is inadequate for assessing PA pressure. Left Atrium: Left atrial size was normal in size. Right Atrium: Right atrial size was normal in size. Pericardium: There is no evidence of pericardial effusion. Mitral Valve: The mitral valve is normal in structure. No evidence of mitral valve regurgitation. Tricuspid Valve: The tricuspid valve is normal in structure. Tricuspid valve regurgitation is not demonstrated. Aortic Valve: The aortic valve was not well visualized. Aortic valve regurgitation is not visualized. No aortic stenosis is present. Aortic valve mean gradient measures 3.0 mmHg. Aortic valve peak gradient measures 6.7 mmHg. Aortic valve area, by VTI measures 2.77 cm. Pulmonic Valve: The pulmonic valve was not well visualized. Pulmonic valve regurgitation is not visualized. Aorta: The aortic root and ascending aorta are structurally normal, with no evidence of dilitation. Venous: The inferior vena cava is normal in size with greater than 50% respiratory variability, suggesting right atrial pressure of 3 mmHg. IAS/Shunts: The interatrial septum was not well visualized.  LEFT VENTRICLE PLAX 2D LVIDd:         4.90 cm  Diastology LVIDs:         2.80 cm  LV e' medial:    8.38 cm/s LV PW:         0.80 cm  LV E/e' medial:  9.0 LV IVS:        0.90 cm  LV e' lateral:   9.14 cm/s LVOT diam:     1.90 cm  LV E/e' lateral: 8.2 LV SV:         69 LV SV Index:   37 LVOT Area:     2.84 cm  RIGHT VENTRICLE RV Basal diam:  2.90 cm LEFT ATRIUM           Index       RIGHT ATRIUM           Index LA diam:      3.10 cm 1.69  cm/m  RA Area:     11.20 cm LA Vol (A2C): 26.8 ml 14.61 ml/m RA Volume:   21.90 ml  11.94 ml/m LA Vol (A4C): 39.2 ml 21.36 ml/m  AORTIC VALVE AV Area (Vmax):    2.64 cm AV Area (Vmean):   2.59 cm AV Area (VTI):     2.77 cm AV Vmax:           129.00 cm/s AV Vmean:          86.700 cm/s AV VTI:            0.248 m AV Peak Grad:      6.7 mmHg AV Mean Grad:      3.0 mmHg LVOT Vmax:         120.00 cm/s LVOT Vmean:        79.200 cm/s LVOT VTI:          0.242 m LVOT/AV VTI ratio: 0.98  AORTA Ao Root diam: 2.50 cm Ao Asc diam:  3.30 cm MITRAL VALVE MV Area (PHT): 3.60 cm     SHUNTS MV Decel Time: 211 msec     Systemic VTI:  0.24 m MV E velocity: 75.30 cm/s   Systemic Diam: 1.90 cm MV A velocity: 107.00 cm/s MV E/A ratio:  0.70 Epifanio Lesches MD Electronically signed by Epifanio Lesches MD Signature  Date/Time: 05/11/2021/5:28:15 PM    Final    CT HEAD CODE STROKE WO CONTRAST  Result Date: 05/23/2021 CLINICAL DATA:  Code stroke.  Acute stroke. EXAM: CT HEAD WITHOUT CONTRAST TECHNIQUE: Contiguous axial images were obtained from the base of the skull through the vertex without intravenous contrast. COMPARISON:  Head CT from 05/11/2021 FINDINGS: Brain: 5.5 x 2.8 cm hematoma in the subcortical high right frontal region with extensive vasogenic edema in the adjacent brain that is progressed. There is worsening local mass effect with midline shift now measuring 13 mm. No entrapment. No cytotoxic edema seen. Vascular: No hyperdense vessel or unexpected calcification. Skull: Normal. Negative for fracture or focal lesion. Sinuses/Orbits: Negative Other: Critical Value/emergent results were called by telephone at the time of interpretation on 05/23/2021 at 9:10 am to provider [facility] , who verbally acknowledged these results. ASPECTS [facility] Stroke Program Early CT Score) Not scored in this setting IMPRESSION: Progressed right frontal hematoma and vasogenic edema with midline shift now measuring 13 mm.  Electronically Signed   By: Marnee Spring M.D.   On: 05/23/2021 09:12   CT HEAD CODE STROKE WO CONTRAST  Result Date: 05/11/2021 CLINICAL DATA:  Code stroke. Left-sided weakness, nausea, and vomiting. EXAM: CT HEAD WITHOUT CONTRAST TECHNIQUE: Contiguous axial images were obtained from the base of the skull through the vertex without intravenous contrast. COMPARISON:  None. FINDINGS: Brain: An acute parenchymal hemorrhage in the high posterior right frontal lobe measures 4.2 x 2.3 x 3.1 cm (approximate volume of 15 mL). There is mild surrounding edema without midline shift, and there is a small amount of adjacent subarachnoid hemorrhage. A very small amount of coexistent subdural hemorrhage is also not excluded in this region. No acute infarct is identified elsewhere. The ventricles are normal in size. Hypodensities in the cerebral white matter bilaterally are nonspecific but compatible with mild chronic small vessel ischemic disease. Vascular: No hyperdense vessel. Skull: No fracture or suspicious osseous lesion. Sinuses/Orbits: Small osteoma in the left frontal sinus. Mild right and moderate left maxillary sinus mucosal thickening. Clear mastoid air cells. Unremarkable orbits. Other: None. ASPECTS [facility] Stroke Program Early CT Score) Not scored in the presence of acute hemorrhage. IMPRESSION: Acute parenchymal hemorrhage in the posterior right frontal lobe with small amount of adjacent subarachnoid hemorrhage. These results were communicated to Dr. Iver Nestle at 9:08 am on 05/11/2021 by text page via the [facility] messaging system. Electronically Signed   By: Sebastian Ache M.D.   On: 05/11/2021 09:12   VAS [place] LOWER EXTREMITY VENOUS (DVT)  Result Date: 05/26/2021  Lower Venous DVT Study Patient Name:  EMIEL KIELTY  Date of Exam:   05/26/2021 Medical Rec #: [id number]         Accession #:    [id number] Date of Birth: [birth year]          Patient Gender: M Patient Age:   65Y Exam Location:  [facility]  Procedure:      VAS [place] LOWER EXTREMITY VENOUS (DVT) Referring Phys: [id number] Eliezer Champagne --------------------------------------------------------------------------------  Indications: Fever.  Limitations: Poor ultrasound/tissue interface. Comparison Study: No previous exams. Performing Technologist: Ernestene Mention RVT, RDMS  Examination Guidelines: A complete evaluation includes B-mode imaging, spectral Doppler, color Doppler, and power Doppler as needed of all accessible portions of each vessel. Bilateral testing is considered an integral part of a complete examination. Limited examinations for reoccurring indications may be performed as noted. The reflux portion of the exam is performed with the patient in reverse Trendelenburg.  +---------+---------------+---------+-----------+----------+--------------+  RIGHT    CompressibilityPhasicitySpontaneityPropertiesThrombus Aging +---------+---------------+---------+-----------+----------+--------------+ CFV      Full           Yes      Yes                                 +---------+---------------+---------+-----------+----------+--------------+ SFJ      Full                                                        +---------+---------------+---------+-----------+----------+--------------+ FV Prox  Full           Yes      Yes                                 +---------+---------------+---------+-----------+----------+--------------+ FV Mid   Full           Yes      Yes                                 +---------+---------------+---------+-----------+----------+--------------+ FV DistalFull           Yes      Yes                                 +---------+---------------+---------+-----------+----------+--------------+ PFV      Full                                                        +---------+---------------+---------+-----------+----------+--------------+ POP      Full           Yes      Yes                                  +---------+---------------+---------+-----------+----------+--------------+ PTV      Full                                                        +---------+---------------+---------+-----------+----------+--------------+ PERO     Full                                                        +---------+---------------+---------+-----------+----------+--------------+   +---------+---------------+---------+-----------+----------+-------------------+ LEFT     CompressibilityPhasicitySpontaneityPropertiesThrombus Aging      +---------+---------------+---------+-----------+----------+-------------------+ CFV      Partial        Yes      Yes                  Acute- non  occlusive           +---------+---------------+---------+-----------+----------+-------------------+ SFJ      Full                                                             +---------+---------------+---------+-----------+----------+-------------------+ FV Prox  Full           Yes      Yes                                      +---------+---------------+---------+-----------+----------+-------------------+ FV Mid   Full           Yes      Yes                                      +---------+---------------+---------+-----------+----------+-------------------+ FV DistalFull           Yes      Yes                                      +---------+---------------+---------+-----------+----------+-------------------+ PFV      Full                                                             +---------+---------------+---------+-----------+----------+-------------------+ POP      Full           Yes      Yes                                      +---------+---------------+---------+-----------+----------+-------------------+ PTV                                                   Not well visualized  +---------+---------------+---------+-----------+----------+-------------------+ PERO     Full                                                             +---------+---------------+---------+-----------+----------+-------------------+ EIV                     Yes      Yes                  Patent by color and  doppler             +---------+---------------+---------+-----------+----------+-------------------+     Summary: BILATERAL: - No evidence of superficial venous thrombosis in the lower extremities, bilaterally. -No evidence of popliteal cyst, bilaterally. RIGHT: - There is no evidence of deep vein thrombosis in the lower extremity.  LEFT: - Findings consistent with acute deep vein thrombosis involving the left common femoral vein.  *See table(s) above for measurements and observations. Electronically signed byCoral Elsebham MD on 05/26/2021 at 7:50:00 PM.    Final    CT ANGIO HEAD NECK W WO CM (CODE STROKE)  Result Date: 05/23/2021 CLINICAL DATA:  Enlarging hematoma EXAM: CT ANGIOGRAPHY HEAD AND NECK TECHNIQUE: Multidetector CT imaging of the head and neck was performed using the standard protocol during bolus administration of intravenous contrast. Multiplanar CT image reconstructions and MIPs were obtained to evaluate the vascular anatomy. Carotid stenosis measurements (when applicable) are obtained utilizing NASCET criteria, using the distal internal carotid diameter as the denominator. CONTRAST:  50mL OMNIPAQUE IOHEXOL 350 MG/ML SOLN COMPARISON:  CTA 05/11/2021 FINDINGS: CTA NECK FINDINGS Aortic arch: Normal Right carotid system: Widely patent vessels with smooth contour. No atheromatous changes Left carotid system: Widely patent vessels with smooth contour. No noted atheromatous changes Vertebral arteries: No proximal subclavian stenosis. The vertebral arteries are widely patent. No suspected vertebral beading when allowing for  streak artifact. Skeleton: Multilevel degenerative disc narrowing and ridging canal and foraminal encroachment the lower cervical levels. Other neck: No evidence of inflammation or mass. Upper chest: Negative Review of the MIP images confirms the above findings CTA HEAD FINDINGS Anterior circulation: Major vessels are smooth and widely patent. No branch occlusion, generalized beading, or aneurysm. There is a cortical branch at the right frontal parietal convexity which is not continuous beyond the level of the upper and lateral hematoma. No visible nidus or early enhancing veins. Posterior circulation: Vertebral and basilar arteries are smooth and widely patent. No branch occlusion, beading, or aneurysm. Venous sinuses: MRV was obtained previously. The veins are largely nonenhancing on this study. Anatomic variants: None significant Review of the MIP images confirms the above findings IMPRESSION: Seemingly truncated arterial cortical branch at the upper and lateral hematoma, without discrete vascular malformation or spot sign. Recommend follow-up after resolution of mass effect to evaluate for small AVM. Electronically Signed   By: Marnee Spring M.D.   On: 05/23/2021 09:54   CT ANGIO HEAD NECK W WO CM (CODE STROKE)  Result Date: 05/11/2021 CLINICAL DATA:  Stroke follow-up.  Left-sided weakness. EXAM: CT ANGIOGRAPHY HEAD AND NECK TECHNIQUE: Multidetector CT imaging of the head and neck was performed using the standard protocol during bolus administration of intravenous contrast. Multiplanar CT image reconstructions and MIPs were obtained to evaluate the vascular anatomy. Carotid stenosis measurements (when applicable) are obtained utilizing NASCET criteria, using the distal internal carotid diameter as the denominator. CONTRAST:  50mL OMNIPAQUE IOHEXOL 350 MG/ML SOLN COMPARISON:  Same day CT head. FINDINGS: CTA NECK FINDINGS Aortic arch: Great vessel origins are patent. Right carotid system: No evidence of  dissection, stenosis (50% or greater) or occlusion. Mild apparent irregularity of the ICA at the skull base in a region of streak artifact. Left carotid system: No evidence of dissection, stenosis (50% or greater) or occlusion. Mild apparent irregularity of the ICA at the skull base in a region of streak artifact. Vertebral arteries: Codominant. No evidence of dissection, stenosis (50% or greater) or occlusion. Skeleton: Moderate degenerative disc disease at at C5-C6 and C6-C7  with disc height loss, endplate sclerosis and posterior disc osteophyte complexes. Other neck: No acute abnormality Upper chest: Visualized lung apices are clear. Review of the MIP images confirms the above findings CTA HEAD FINDINGS Anterior circulation: No large vessel occlusion or proximal hemodynamically significant stenosis. Evaluation of the distal vasculature is limited due to venous contamination. No evidence of and arteriovenous malformation or aneurysm in the region of intraparenchymal hemorrhage, although acute blood products limit evaluation. Posterior circulation: No large vessel occlusion or proximal hemodynamically significant stenosis. Venous sinuses: The superior sagittal sinus is narrowed in the region of hemorrhage, most likely from mass effect. Small right transverse and sigmoid sinuses. Review of the MIP images confirms the above findings IMPRESSION: CTA Head: 1. No large vessel occlusion or hemodynamically significant proximal arterial stenosis. 2. The superior sagittal sinus is narrowed in the region of hemorrhage with poorly visualized draining cortical veins in this region. While indeterminate, this may be secondary to mass effect given no definite/clear intraluminal thrombus. Given these findings and the location of hemorrhage; however, recommend low threshold for follow-up CTV or MRI with contrast to ensure stability and exclude worsening thrombosis. 3. No evidence of and arteriovenous malformation or aneurysm in the  region of intraparenchymal hemorrhage, although acute blood products limit evaluation. Follow-up imaging after resolution of hemorrhage could provide further assessment if clinically indicated. CTA Neck: 1. No significant (greater than 50%) stenosis. 2. Mild apparent irregularity of bilateral ICAs at the skull base is favored artifactual given streak artifact from dental amalgam in this region. Fibromuscular dysplasia is a differential consideration. Findings and recommendations discussed with Dr. Iver Nestle via telephone at 9:59 a.m. Electronically Signed   By: Feliberto Harts MD   On: 05/11/2021 10:09     PHYSICAL EXAM  Temp:  [98.1 F (36.7 C)-99.3 F (37.4 C)] 98.1 F (36.7 C) (06/18 1200) Pulse Rate:  [98-120] 111 (06/18 1300) Resp:  [15-23] 17 (06/18 1300) BP: (132-176)/(81-100) 143/98 (06/18 1300) SpO2:  [95 %-99 %] 96 % (06/18 1300) Weight:  [65.6 kg] 65.6 kg (06/18 0400)  General - Well nourished, well developed, sleepy but arousable.  Ophthalmologic - fundi not visualized due to noncooperation.  Cardiovascular - Regular rhythm and mild tachycardia.  Neuro - sleepy but eyes open with voice, he was able to follow peripheral commands on the right hand and most central commands.  Moderate dysarthria, oriented to place and people age but not to time. Right gaze preference, but unable to cross midline. Not blinking to visual threat left greater than right. Left facial droop. Tongue protrusion not cooperative. Right UE spontaneous movement against gravity and RLE some spontaneous movement but not against gravity, LUE and LLE plegic with increased muscle tone. Sensation, coordination not cooperative and gait not tested.   ASSESSMENT/PLAN Donald [name] is a 65 y.o. male with history of dementia with behavioral disturbance and OSA on CPAP admitted for right gaze and left hemoplegia, left facial droop. 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 He was stabilized and sent to CIR. 6/12  found to be lethargic and drowsy, CT repeat showed increased cerebral edema and midline shift. EEG no seizure s/p ativan and keppra. He was transfer back to ICU for further management.   Encephalopathy  Improving Likely combination of cerebral edema, ? Seiziure, fever, leukocytosis and AKI Treat underlying condition as below Neuro check closely  Cerebral edema  CT head 6/12 showed progression of right frontal ICH and cerebral edema with midline shift CTA head and neck 6/12 no  discrete AVM or spot sign CT repeat 6/16 unchanged, no progression of midline shift or hematoma. On 3% saline 50->65->30 cc/h->off Na Q6h Na 135->140->144->151->158->155->156->157>158 Na goal 145-155  ? 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 No CSVT on venous phase CT repeat mildly increased left frontal ICH 05/12/21 MRI brain no significant change of ICH since last CT MRV Non dominant right transverse and sigmoid dural venous sinuses. CT repeat 6/16 unchanged, no progression of midline shift or hematoma. Cerebral angiogram no AVM or aneurysm, concerning for Poor to no sig opacification of RT transverse sinus in its entirety, ?Suspicious for thrombotic occlusion. MRV with and without pending 2D Echo  EF 65-70% LDL 140 HgbA1c 5.8 UDS neg Heparin subq for VTE prophylaxis No antithrombotic prior to admission, now on No antithrombotic given ICH Ongoing aggressive stroke risk factor management Therapy recommendations:  pending Disposition:  Pending   ?? 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  DVT LE venous Doppler showed left common femoral vein DVT Not a candidate for anticoagulation at this time S/p IVC filter 6/16  Dementia  Has appointment with [facility] neurology for LP and evaluation of dementia, but pt not able to go due to onset of ICH Delirium precaution Continue outpt follow up with [facility] neurology  Hyperlipidemia Home meds:  none  LDL 140, goal < 70 Will consider low dose statin (lipitor 20) on discharge   Dysphagia  Speech to follow NPO  S/p cortrak on TF @ 50  Fever and leukocytosis Tmax 101.4->102.6->100.9->102.4->101.7->afebrile Leukocytosis WBC 7.6->12.4 ->8.1->7.8->22.9->20.4->24.8-> 23.9->17.7 CCM on board Continue monitoring UA neg CXR Minimal left basilar atelectasis On Rocephin now  Other Stroke Risk Factors Advanced age Obstructive sleep apnea, on CPAP at home  Other Active Problems AKI Cre 1.20->1.45->1.03->1.02->0.98->1.09 - on IVF and TF  Hospital day # 6 Patient seems to be making gradual improvement.  Recommend mobilize out of bed.  Ongoing therapies.  Transfer to neurology stepdown bed available.  Long discussion with patient and [name] at the bedside and answered questions.  Discussed with critical care team nurse practitioner Delorise Shiner. Bowser  This patient is critically ill due to right frontal ICH, cerebral edema, mass-effect with midline shift, DVT, possible seizure, fever and leukocytosis and at significant risk of neurological worsening, death form sepsis,  septic shock, status epilepticus, brain herniation, heart failure and respiratory failure. This patient's care requires constant monitoring of vital signs, hemodynamics, respiratory and cardiac monitoring, review of multiple databases, neurological assessment, discussion with family, other specialists and medical decision making of high complexity. I spent 30 minutes of neurocritical care time in the care of  this patient.  I discussed with   Dr. Merrily Pew.   Delia Heady, MD   05/29/2021 1:43 PM    To contact Stroke Continuity provider, please refer to [email or web address]. After hours, contact General Neurology

## 2021-05-29 NOTE — Evaluation (Signed)
Occupational Therapy Evaluation Patient Details Name: Donald [name] MRN: [id number] DOB: October 12, 1956 Today's Date: 05/29/2021    History of Present Illness 65 y.o. M who presents during CIR stay with worsening midline shift of R MCA, R ACA and DVT in RLE and pt continues with right gaze and left hemiplegia. Pt with 6/1 CT head showing ICH R frontal lobe and small adjacent SAH. Significant PMH: memory deficits, OSA.   Clinical Impression   Pt PTA: Pt from CIR and new episode of AMS resulting in above dx. Pt was feeding himself with set-upA and working on mobility/ADL on CIR. Pt currently, Pt with expressive and receptive communication deficits. Pt with minimal initiation on R side with cues.  Pt's session focus on sitting balance, cognition and decreased ability to care for self. Pt's spouse in room and able to get pt to follow, Pt able to track to midline from R side at times, but inconsistent. Pt with R gaze preference and L dense hemiplegia. Pt near TotalA for ADL and modA +2 to totalA +2 for bed mobility and transfers. BP stable 128/73 throughout exertion. Pt would greatly benefit from continued OT skilled services. OT following acutely.    Follow Up Recommendations  CIR    Equipment Recommendations  3 in 1 bedside commode    Recommendations for Other Services Rehab consult     Precautions / Restrictions Precautions Precautions: Fall;Other (comment) Precaution Comments: L hemiparesis, L inattention, pusher to the L, cognitive deficits, cortrak Restrictions Weight Bearing Restrictions: No      Mobility Bed Mobility Overal bed mobility: Needs Assistance Bed Mobility: Supine to Sit;Sit to Supine Rolling: Mod assist;+2 for physical assistance;+2 for safety/equipment Sidelying to sit: Total assist;+2 for physical assistance;+2 for safety/equipment   Sit to supine: Total assist;+2 for safety/equipment   General bed mobility comments: assist for rolling side to side     Transfers Overall transfer level: Needs assistance Equipment used: 2 person hand held assist Transfers: Sit to/from [employer] Transfers Sit to Stand: Total assist;+2 physical assistance;+2 safety/equipment   Squat pivot transfers: Max assist;+2 physical assistance     General transfer comment: squat pivot to move hips towards EOB    Balance Overall balance assessment: Needs assistance Sitting-balance support: Feet supported;Single extremity supported Sitting balance-Leahy Scale: Poor Sitting balance - Comments: Pusher to the L Postural control: Left lateral lean;Posterior lean Standing balance support: Bilateral upper extremity supported Standing balance-Leahy Scale: Zero Standing balance comment: posterior and lean to L.                           ADL either performed or assessed with clinical judgement   ADL Overall ADL's : Needs assistance/impaired Eating/Feeding: NPO Eating/Feeding Details (indicate cue type and reason): cortrak Grooming: Maximal assistance;Bed level   Upper Body Bathing: Total assistance;Bed level   Lower Body Bathing: Total assistance;Bed level   Upper Body Dressing : Total assistance;Bed level   Lower Body Dressing: Total assistance;Bed level   Toilet Transfer: Total assistance;+2 for physical assistance;+2 for safety/equipment;Maximal assistance Toilet Transfer Details (indicate cue type and reason): simulated for sit to stand, but unable to bear weight on L side so unsafe to stand as pt too fatigued. Pt with minimal initiation on R side Toileting- Clothing Manipulation and Hygiene: Total assistance Toileting - Clothing Manipulation Details (indicate cue type and reason): unable to tell that he had a BM in bed     Functional mobility during ADLs: Maximal assistance;Total assistance;+2 for  safety/equipment;Cueing for safety;Cueing for sequencing General ADL Comments: Pt's session focus on sitting balance, cognition and  decreased ability to care for self. Pt's spouse in room and able to get pt to follow.track to midline from R side at times, but inconsistent. Pt with R gaze preference and L dense hemiplegia.     Vision Patient Visual Report: Other (comment) (track from R to close to midline) Vision Assessment?: Vision impaired- to be further tested in functional context Eye Alignment: Impaired (comment) Ocular Range of Motion: Restricted on the right Alignment/Gaze Preference: Head turned;Head tilt;Gaze right Tracking/Visual Pursuits: Requires cues, head turns, or add eye shifts to track;Decreased smoothness of vertical tracking;Decreased smoothness of eye movement to LEFT superior field;Decreased smoothness of eye movement to LEFT inferior field Visual Fields: Left visual field deficit;Left homonymous hemianopsia Additional Comments: difficult to fully assess as pt very drowsy. Pt's first time up in 6 days.     Perception     Praxis      Pertinent Vitals/Pain Pain Assessment: Faces Faces Pain Scale: No hurt Pain Intervention(s): Monitored during session     Hand Dominance Right   Extremity/Trunk Assessment Upper Extremity Assessment Upper Extremity Assessment: Generalized weakness RUE Deficits / Details: AAROM, WFLs strength 4/5 LUE Deficits / Details: No grasp strength or hand AROM. No active movement at scapula and noting increased tone. LUE Sensation: decreased light touch;decreased proprioception LUE Coordination: decreased fine motor;decreased gross motor   Lower Extremity Assessment Lower Extremity Assessment: Generalized weakness LLE Deficits / Details: no active movement   Cervical / Trunk Assessment Cervical / Trunk Assessment: Normal   Communication Communication Communication: Expressive difficulties   Cognition Arousal/Alertness: Lethargic Behavior During Therapy: Flat affect Overall Cognitive Status: Impaired/Different from baseline Area of Impairment: Awareness;Problem  solving;Safety/judgement;Attention;Following commands                 Orientation Level: Disoriented to;Situation;Place Current Attention Level: Sustained Memory: Decreased short-term memory Following Commands: Follows one step commands with increased time Safety/Judgement: Decreased awareness of safety;Decreased awareness of deficits Awareness: Intellectual Problem Solving: Requires verbal cues;Requires tactile cues;Slow processing;Decreased initiation;Difficulty sequencing General Comments: Pt asking to "call for a ride because he is going home."   General Comments  BP stable 128/73 throughout exertion.Pt with brief episode of fatigue and less alertness, but pt awoke and wanted to got back in bed. BP stable, HR increased to 117 BPM    Exercises Exercises: Other exercises Other Exercises Other Exercises: seated truncal sitting balance tasks with RUE reaching Other Exercises: PROM to LUE and AAROM to RUE   Shoulder Instructions      Home Living Family/patient expects to be discharged to:: Private residence Living Arrangements: Spouse/significant other Available Help at Discharge: Family;Available 24 hours/day Type of Home: House Home Access: Stairs to enter [employer] of Steps: 4 Entrance Stairs-Rails: Left Home Layout: One level     Bathroom Shower/Tub: Producer, television/film/video: Standard Bathroom Accessibility: Yes How Accessible: Accessible via walker Home Equipment: Shower seat - built in;Grab bars - toilet;Grab bars - tub/shower   Additional Comments: has access to RW from pt's MIL  Lives With: Spouse    Prior Functioning/Environment Level of Independence: Independent        Comments: part-time Education officer, 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        OT Problem List:        OT Treatment/Interventions: Self-care/ADL training;Therapeutic exercise;Energy conservation;Neuromuscular education;DME and/or  AE instruction;Therapeutic activities;Patient/family education;Balance training;Cognitive remediation/compensation;Visual/perceptual  remediation/compensation    OT Goals(Current goals can be found in the care plan section) Acute Rehab OT Goals Patient Stated Goal: spouse wants pt to return to CIR OT Goal Formulation: With family Time For Goal Achievement: 06/12/21 Potential to Achieve Goals: Good ADL Goals Pt Will Perform Eating: with adaptive utensils;sitting;bed level;with mod assist Pt Will Perform Grooming: with mod assist;sitting;bed level Pt Will Transfer to Toilet: with max assist;squat pivot transfer;bedside commode Pt/caregiver will Perform Home Exercise Program: Increased strength;Left upper extremity;With written HEP provided;With minimal assist Additional ADL Goal #1: Pt will  recall 3 household items and utilize properly with minimal cues Additional ADL Goal #2: Pt will perform bed mobility with modA for proper sequencing and support as precursor for ADL  OT Frequency: Min 2X/week   Barriers to D/C:            Co-evaluation PT/OT/SLP Co-Evaluation/Treatment: Yes Reason for Co-Treatment: Complexity of the patient's impairments (multi-system involvement);Necessary to address cognition/behavior during functional activity;To address functional/ADL transfers   OT goals addressed during session: ADL's and self-care;Strengthening/ROM      AM-PAC OT "6 Clicks" Daily Activity     Outcome Measure Help from another person eating meals?: Total Help from another person taking care of personal grooming?: A Lot Help from another person toileting, which includes using toliet, bedpan, or urinal?: Total Help from another person bathing (including washing, rinsing, drying)?: Total Help from another person to put on and taking off regular upper body clothing?: Total Help from another person to put on and taking off regular lower body clothing?: Total 6 Click Score: 7   End of Session  Equipment Utilized During Treatment: Gait belt Nurse Communication: Mobility status  Activity Tolerance: Patient limited by fatigue Patient left: in bed;with call bell/phone within reach;with bed alarm set;with restraints reapplied  OT Visit Diagnosis: Unsteadiness on feet (R26.81);Muscle weakness (generalized) (M62.81) Symptoms and signs involving cognitive functions: Nontraumatic SAH Hemiplegia - Right/Left: Left Hemiplegia - dominant/non-dominant: Non-Dominant Hemiplegia - caused by: Nontraumatic SAH Pain - Right/Left: Right                Time: 5462-7035 OT Time Calculation (min): 41 min Charges:  OT General Charges $OT Visit: 1 Visit OT Evaluation $OT Eval Moderate Complexity: 1 Mod OT Treatments $Neuromuscular Re-education: 8-22 mins  Flora Lipps, OTR/L Acute Rehabilitation Services Pager: [phone] Office: [phone]   Donald [name]  C 05/29/2021, 11:27 AM

## 2021-05-29 NOTE — Progress Notes (Signed)
Sodium 160. Dr Merrily Pew notified, verbal order for free water flushes q6hrs

## 2021-05-29 NOTE — Progress Notes (Signed)
NAME:  Donald [name], MRN:  [id number], DOB:  01/25/1956, LOS: 6 ADMISSION DATE:  05/23/2021, CONSULTATION DATE:  05/29/2021 REFERRING MD:  Admitted from Rehab, CHIEF COMPLAINT:  altered mental status   History of Present Illness:  Donald [name] is a 65 year old gentleman with recent intracranial hemorrhage who presents from rehab with worsening mental status and seizures.  He initially presented May 31 with new onset left-sided hemiparesis, right gaze, and left mild facial droop.  He was found to have acute intracranial hemorrhage of the posterior right frontal lobe.  He was medically managed with blood pressure control and discharged to inpatient rehab.  He had just finished his first day of intensive rehab.  At rehab he was sitting up, eating with assistance, able to talk.  This morning he was being fed breakfast with assistance and his nursing tech noted difficulty swallowing, facial droop, decreased mentation.  Code stroke was called.  CT head shows enlargement of his existing right intraparenchymal hemorrhage, with worsening midline shift.     Pertinent  Medical History  Hypertension Mild cognitive decline Intracranial hemorrhage  Significant Hospital Events: Including procedures, antibiotic start and stop dates in addition to other pertinent events   6/12 admitted from inpatient rehab for acute worsening of intracranial hemorrhage of the right frontal lobe 6/13 Hypertonic started. NIR cancelled due to neurostatus 6/15 Improving mentation. DVT right leg. Started on empiric antibiotics.  6/17 no AVM, possible R transverse sinus occlusion 6/18 stable for transfer out of ICU. Will go for MRI    Interim History / Subjective:  NAEO  Worked with OT and is fairly tired now  Asking when he can have something to eat   Objective   Blood pressure 138/85, pulse 98, temperature 98.9 F (37.2 C), temperature source Oral, resp. rate 16, weight 65.6 kg, SpO2 99 %. On RA        Intake/Output  Summary (Last 24 hours) at 05/29/2021 1032 Last data filed at 05/29/2021 1000 Gross per 24 hour  Intake 2312.09 ml  Output 1050 ml  Net 1262.09 ml   Filed Weights   05/26/21 0359 05/28/21 0440 05/29/21 0400  Weight: 66 kg 63.4 kg 65.6 kg    Examination: General: ill appearing older adult M reclined in bed NAD  HEENT: NCAT. Cortrak secure. Anicteric sclera Neuro: Awake, following R sided commands but is weak. L side flaccid  CV: rrr s1s2 cap refill brisk  PULM: CTAb symmetrical chest expansion, even and unlabored on RA GI: soft round ndnt + bowel sounds  Extremities: no acute joint deformity. Dependent non-pitting extremity edema Skin: c/d/w no rash   Labs/imaging that I havepersonally reviewed  (right click and "Reselect all SmartList Selections" daily)  Na 158 WBC 12.9  Ucx- 30,000 staph aureus   CT head shows stable edema and expected evolution of hemorrhage CTA no AVM  Resolved Hospital Problem list   AKI   Assessment & Plan:   Donald [name] is 65 year old gentleman with recent intracranial hemorrhage , transferred to rehab, readmitted with worsening mental status changes and rebleeding of his right frontal lobe hemorrhage  Acute encephalopathy - improving, superimposed on baseline cognitive decline with behavioral disturbance  ICH with associated cerebral edema -cerebral angiogram 6/17 -- no AVM, but concern for thrombotic occlusion of R transverse sinus -some question of possible CAA though pattern not typical on imaging -no evidence of sz  Plan: -BID Keppra  -off 3% saline, allowing Na to drift down  -follow BID Na  -OP workup of cog  decline at [facility]   R femoral DVT  -s/p IVC   Leukocytosis, mild  -reactive v infectious -- has 30,000 staph a. in urine (doubt that as a source for sepsis) possible LLL PNA  P -course of rocephin x 5 d  -trend WBC, fever curve   Hyperglycemia -SSI   Best practice (right click and "Reselect all SmartList Selections" daily)   Diet:  Tube Feed   Pain/Anxiety/Delirium protocol (if indicated): No VAP protocol (if indicated): Not indicated DVT prophylaxis: LMWH GI prophylaxis: N/A Glucose control:  SSI Yes Central venous access:  N/A Arterial line:  N/A Foley:  N/A Mobility:  bed rest  PT consulted: Yes Last date of multidisciplinary goals of care discussion [6/13 at bedside with wife] Wife updated at bedside 6/18 Code Status:  full code Disposition: stable for transfer out of ICU. Will ask [facility] to take over care 6/19 ccm off    CCT: n/a    Tessie Fass MSN, AGACNP-BC [facility] Pulmonary/Critical Care Medicine Amion for pager  05/29/2021, 11:40 AM

## 2021-05-30 ENCOUNTER — Inpatient Hospital Stay (HOSPITAL_COMMUNITY): Payer: No Typology Code available for payment source

## 2021-05-30 DIAGNOSIS — E876 Hypokalemia: Secondary | ICD-10-CM

## 2021-05-30 DIAGNOSIS — I610 Nontraumatic intracerebral hemorrhage in hemisphere, subcortical: Secondary | ICD-10-CM

## 2021-05-30 DIAGNOSIS — E440 Moderate protein-calorie malnutrition: Secondary | ICD-10-CM

## 2021-05-30 DIAGNOSIS — J189 Pneumonia, unspecified organism: Secondary | ICD-10-CM

## 2021-05-30 DIAGNOSIS — I82411 Acute embolism and thrombosis of right femoral vein: Secondary | ICD-10-CM

## 2021-05-30 DIAGNOSIS — R739 Hyperglycemia, unspecified: Secondary | ICD-10-CM

## 2021-05-30 DIAGNOSIS — R7303 Prediabetes: Secondary | ICD-10-CM

## 2021-05-30 LAB — GLUCOSE, CAPILLARY
Glucose-Capillary: 152 mg/dL — ABNORMAL HIGH (ref 70–99)
Glucose-Capillary: 179 mg/dL — ABNORMAL HIGH (ref 70–99)
Glucose-Capillary: 134 mg/dL — ABNORMAL HIGH (ref 70–99)
Glucose-Capillary: 154 mg/dL — ABNORMAL HIGH (ref 70–99)
Glucose-Capillary: 142 mg/dL — ABNORMAL HIGH (ref 70–99)

## 2021-05-30 LAB — SODIUM: Sodium: 156 mmol/L — ABNORMAL HIGH (ref 135–145)

## 2021-05-30 MED ORDER — GADOBUTROL 1 MMOL/ML IV SOLN
6.5000 mL | Freq: Once | INTRAVENOUS | Status: AC | PRN
  Administered 2021-05-30: 6.5 mL via INTRAVENOUS

## 2021-05-30 NOTE — Progress Notes (Signed)
STROKE TEAM PROGRESS NOTE   SUBJECTIVE (INTERVAL HISTORY) No one is at the bedside. Pt neuro stable, no acute event overnight.    Patient appears more alert and interactive.  He is afebrile.  Vital signs are stable.  Neurological exam is unchanged.  Blood pressure adequately controlled.  MR venogram shows no evidence of transverse sinus thrombosis but congenitally hypoplastic right transverse sinus OBJECTIVE Temp:  [98.1 F (36.7 C)-99.2 F (37.3 C)] 99.2 F (37.3 C) (06/19 0755) Pulse Rate:  [88-115] 103 (06/19 1100) Cardiac Rhythm: Sinus tachycardia (06/19 0800) Resp:  [12-23] 18 (06/19 1100) BP: (118-158)/(75-103) 152/80 (06/19 1100) SpO2:  [93 %-100 %] 98 % (06/19 1100)  Recent Labs  Lab 05/29/21 1525 05/29/21 2004 05/29/21 2349 05/30/21 0343 05/30/21 0755  GLUCAP 155* 166* 197* 152* 179*   Recent Labs  Lab 05/24/21 0134 05/24/21 0838 05/24/21 1604 05/24/21 1929 05/25/21 0141 05/25/21 0743 05/25/21 1429 05/25/21 2306 05/26/21 0335 05/26/21 0912 05/27/21 0300 05/27/21 1637 05/28/21 0450 05/28/21 1636 05/29/21 0617 05/29/21 1655 05/30/21 0543  NA 135   < > 141   < > 144  145   < > 154*   < > 158*   < > 156*   < > 157* 158* 158* 160* 156*  K 4.4  --   --   --  4.4  --   --   --  4.0  --  4.2  --  3.8  --  3.6  --   --   CL 96*  --   --   --  115*  --   --   --  121*  --  119*  --  114*  --  124*  --   --   CO2 27  --   --   --  23  --   --   --  28  --  30  --  31  --  27  --   --   GLUCOSE 135*  --   --   --  200*  --   --   --  168*  --  93  --  134*  --  250*  --   --   BUN 27*  --   --   --  29*  --   --   --  23  --  25*  --  26*  --  25*  --   --   CREATININE 1.45*  --   --   --  1.03  --   --   --  1.02  --  0.98  --  1.09  --  0.98  --   --   CALCIUM 10.1  --   --   --  9.2  --   --   --  9.7  --  9.8  --  10.4*  --  9.4  --   --   MG 2.5*  --  2.5*  --  2.6*  --  2.5*  --   --   --   --   --   --   --   --   --   --   PHOS 3.6  --  3.2  --  2.1*  --   1.9*  --   --   --   --   --   --   --   --   --   --    < > = values  in this interval not displayed.   No results for input(s): AST, ALT, ALKPHOS, BILITOT, PROT, ALBUMIN in the last 168 hours.  Recent Labs  Lab 05/25/21 0141 05/26/21 0335 05/27/21 0300 05/28/21 0450 05/29/21 0617  WBC 20.4* 24.8* 23.9* 17.7* 12.9*  HGB 14.7 14.0 14.3 15.6 13.5  HCT 44.1 43.9 44.6 49.7 43.8  MCV 90.9 94.4 93.3 95.4 95.4  PLT 268 360 425* 492* 434*   No results for input(s): CKTOTAL, CKMB, CKMBINDEX, TROPONINI in the last 168 hours. No results for input(s): LABPROT, INR in the last 72 hours. No results for input(s): COLORURINE, LABSPEC, PHURINE, GLUCOSEU, HGBUR, BILIRUBINUR, KETONESUR, PROTEINUR, UROBILINOGEN, NITRITE, LEUKOCYTESUR in the last 72 hours.  Invalid input(s): APPERANCEUR      Component Value Date/Time   CHOL 232 (H) 05/11/2021 1118   CHOL 204 (H) 09/11/2020 1449   TRIG 73 05/11/2021 1118   HDL 77 05/11/2021 1118   HDL 63 09/11/2020 1449   CHOLHDL 3.0 05/11/2021 1118   VLDL 15 05/11/2021 1118   LDLCALC 140 (H) 05/11/2021 1118   LDLCALC 132 (H) 09/11/2020 1449   LDLCALC 107 (H) 04/29/2020 0826   Lab Results  Component Value Date   HGBA1C 5.8 (H) 05/11/2021      Component Value Date/Time   LABOPIA NONE DETECTED 05/11/2021 1109   COCAINSCRNUR NONE DETECTED 05/11/2021 1109   LABBENZ NONE DETECTED 05/11/2021 1109   AMPHETMU NONE DETECTED 05/11/2021 1109   THCU NONE DETECTED 05/11/2021 1109   LABBARB NONE DETECTED 05/11/2021 1109    No results for input(s): ETH in the last 168 hours.   I have personally reviewed the radiological images below and agree with the radiology interpretations.  DG Abd 1 View  Result Date: 05/20/2021 CLINICAL DATA:  Abdominal pain. EXAM: ABDOMEN - 1 VIEW COMPARISON:  None. FINDINGS: Large amount of intestinal gas throughout small and large bowel suggesting ileus. There does not appear to be a large amount of fecal matter. No focal obstruction is  evident. No free air. No abnormal calcifications or significant bone findings. IMPRESSION: Large amount of intestinal gas suggesting ileus. Electronically Signed   By: Paulina Fusi M.D.   On: 05/20/2021 19:25   CT HEAD WO CONTRAST  Result Date: 05/27/2021 CLINICAL DATA:  65 year old male with right hemisphere hemorrhage at code stroke presentation 05/11/2021. Subsequent encounter. EXAM: CT HEAD WITHOUT CONTRAST TECHNIQUE: Contiguous axial images were obtained from the base of the skull through the vertex without intravenous contrast. COMPARISON:  Head CT 05/23/2021 and earlier. FINDINGS: Brain: Evolving right superior frontal lobe hemorrhage. Slowly fading hyperdense and mixed density blood products involving the superior and middle frontal gyri in an area of up to 48 x 35 x 50 mm (AP by transverse by CC). Continued regional edema and mass effect including effaced right lateral ventricle and leftward midline shift up to 7 mm at the midportion of the septum pellucidum. No ventriculomegaly. No intraventricular or significant extra-axial hemorrhage. Basilar cisterns remain patent. Stable gray-white matter differentiation elsewhere. No acute cortically based infarct. Vascular: Mild Calcified atherosclerosis at the skull base. Skull: Stable, negative. Sinuses/Orbits: Mild to moderate maxillary alveolar recess mucosal thickening. Otherwise paranasal sinuses and mastoids are stable and well aerated. Other: Right nasoenteric tube in place. Visualized orbits and scalp soft tissues are within normal limits. IMPRESSION: 1. Slowly evolving right superior frontal lobe hemorrhage, not significantly changed in size or configuration since 05/23/2021, with stable regional edema and mass effect. 2. No new intracranial abnormality. Electronically Signed   By: Rexene Edison  Margo Aye M.D.   On: 05/27/2021 06:24   CT HEAD WO CONTRAST  Result Date: 05/23/2021 CLINICAL DATA:  Follow-up intracerebral hemorrhage EXAM: CT HEAD WITHOUT CONTRAST  TECHNIQUE: Contiguous axial images were obtained from the base of the skull through the vertex without intravenous contrast. COMPARISON:  CT 05/23/2021 FINDINGS: Brain: No significant interval change in the overall size of the intraparenchymal hemorrhagic component seen in the right frontal with some extensive surrounding hypo attenuating edematous changes. Locoregional mass effect with sulcal effacement and effacement of the right lateral ventricle as well as stable subfalcine herniation with a right to left midline shift of approximately 14 mm. Slight medialization of the right uncus without clear transtentorial herniation is also unchanged from prior. Basal cisterns remain patent. No new sites of hemorrhage. No new loss of gray-white differentiation. Vascular: No hyperdense vessel or unexpected calcification. Skull: Normal. Negative for fracture or focal lesion. EEG leads in place. Sinuses/Orbits: Mild mural thickening in the maxillary sinuses. Paranasal sinuses and mastoid air cells are otherwise predominantly clear. Included orbital structures are unremarkable. Other: None. IMPRESSION: 1. Stable appearance of the right frontal lobe intraparenchymal hemorrhage with surrounding cerebral edema, local mass effect as well as subfalcine midline shift of approximately 14 mm in slight medial ization of the right uncus, not significantly changed from comparison imaging. 2. No new sites of hemorrhage or new gray-white differentiation loss. Electronically Signed   By: Kreg Shropshire M.D.   On: 05/23/2021 23:58   CT HEAD WO CONTRAST  Result Date: 05/23/2021 CLINICAL DATA:  Follow-up right frontal parenchymal hemorrhage seen earlier today. EXAM: CT HEAD WITHOUT CONTRAST TECHNIQUE: Contiguous axial images were obtained from the base of the skull through the vertex without intravenous contrast. COMPARISON:  Earlier today. FINDINGS: Brain: No significant change in the previously described high right frontal lobe parenchymal  hematoma with extensive surrounding edema and midline shift to the left. The midline shift currently measures 10 mm on image number 16/3. No interval ventricular enlargement. No new hemorrhage. Vascular: No hyperdense vessel or unexpected calcification. Skull: Normal. Negative for fracture or focal lesion. Sinuses/Orbits: Stable small left frontal sinus osteoma. Mild moderate left maxillary sinus mucosal thickening without significant change. Unremarkable orbits. Other: Bilateral concha bullosa. IMPRESSION: 1. Stable large high right frontal parenchymal hematoma with extensive associated edema and midline shift to the left. 2. Stable left maxillary chronic sinusitis. Electronically Signed   By: Beckie Salts M.D.   On: 05/23/2021 18:00   CT HEAD WO CONTRAST  Result Date: 05/23/2021 CLINICAL DATA:  Cerebral hemorrhage, decreased responsiveness EXAM: CT HEAD WITHOUT CONTRAST TECHNIQUE: Contiguous axial images were obtained from the base of the skull through the vertex without intravenous contrast. COMPARISON:  Earlier same day FINDINGS: Brain: Parenchymal hematoma centered within the high right frontal lobe does not measure substantially changed from the prior study. Extensive surrounding edema is again noted with regional mass effect. Leftward midline shift measures similar at 14 mm at the level of the septum pellucidum. Ventricle caliber is unchanged with no new trapping. No significant central herniation. There is no new loss of gray-white differentiation. Intraventricular hemorrhage is identified. Vascular: No new findings. Skull: Unremarkable. Sinuses/Orbits: No acute finding. Other: None. IMPRESSION: No substantial change in right frontal parenchymal hemorrhage, edema, and mass effect including midline shift. No new ventricle trapping or acute infarction. Electronically Signed   By: Guadlupe Spanish M.D.   On: 05/23/2021 12:49   CT HEAD WO CONTRAST  Result Date: 05/11/2021 CLINICAL DATA:  Stroke.   Intracranial hemorrhage  follow-up. EXAM: CT HEAD WITHOUT CONTRAST TECHNIQUE: Contiguous axial images were obtained from the base of the skull through the vertex without intravenous contrast. COMPARISON:  Same day head CT. FINDINGS: Brain: Mild interval increase in the size of the right frontal intraparenchymal hemorrhage, measuring 4.5 x 2.6 x 3.5 cm (previously 4.2 x 2.3 x 3.1 cm). Similar adjacent small volume subarachnoid hemorrhage. A small amount of subdural hemorrhage in this region is also not excluded. Surrounding edema is similar. Similar local mass effect without midline shift. Basal cisterns are patent. No evidence of acute large vascular territory infarct elsewhere. Similar mild white matter hypodensities, likely chronic microvascular ischemic disease. No hydrocephalus. Vascular: No hyperdense vessel.  Calcific atherosclerosis. Skull: No acute fracture. Sinuses/Orbits: Small left frontal sinus osteoma. Mild right and moderate left maxillary sinus mucosal thickening. Unremarkable orbits. Other: No mastoid effusions. IMPRESSION: Mild interval increase in the size of the right frontal intraparenchymal hemorrhage, measuring 4.5 x 2.6 x 3.5 cm (previously 4.2 x 2.3 x 3.1 cm). Similar adjacent small volume hemorrhage. Electronically Signed   By: Feliberto Harts MD   On: 05/11/2021 17:05   MR BRAIN WO CONTRAST  Result Date: 05/12/2021 CLINICAL DATA:  65 year old male code stroke presentation with intra-axial right hemisphere hemorrhage. Hypertensive (194/104) on presentation. EXAM: MRI HEAD WITHOUT CONTRAST TECHNIQUE: Multiplanar, multiecho pulse sequences of the brain and surrounding structures were obtained without intravenous contrast. COMPARISON:  CT head CTA head and neck 05/11/2021 FINDINGS: Brain: Coronal T2 weighted imaging could not be obtained. And some of the exam is intermittently degraded by motion artifact despite repeated imaging attempts. T1 isointense intra-axial hemorrhage in the  posterior right frontal lobe demonstrates a layering hematocrit level (series 13, image 19) and blood encompasses 43 by 38 x 33 mm (AP by transverse by CC) for an estimated volume of 26 mL. Regional edema. Mild regional mass effect. Trace subarachnoid hemorrhage also suspected on axial FLAIR imaging. Additionally, on SWI there is also chronic hemosiderin in the right parietal lobe on series 19 image 34. But no other definite chronic blood products. Susceptibility related abnormal diffusion in the posterior right frontal lobe with no convincing larger area of restricted diffusion. No restricted diffusion elsewhere. No IVH or ventriculomegaly. Normal basilar cisterns. Negative pituitary and cervicomedullary junction. Wallace Cullens and white matter signal outside of the affected right hemisphere is largely normal for age with mild nonspecific white matter changes. Vascular: Major intracranial vascular flow voids are preserved. Skull and upper cervical spine: Visualized bone marrow signal is within normal limits. Grossly negative cervical spine. Sinuses/Orbits: Negative orbits. Mild to moderate maxillary sinus mucosal thickening. Other: Mastoids are clear. Grossly normal visible internal auditory structures. IMPRESSION: 1. Posterior right frontal lobe intra-axial hemorrhage with layering hematocrit level has not significantly changed (estimated blood volume of 26 mL). Surrounding edema and mild regional mass effect. Trace subarachnoid hemorrhage. 2. No larger underlying infarct is evident. But chronic hemosiderin in the right parietal lobe indicates a previous intra-axial hemorrhage in the region. 3. No other acute intracranial abnormality. Electronically Signed   By: Odessa Fleming M.D.   On: 05/12/2021 07:26   IR IVC FILTER PLMT / S&I Lenise Arena GUID/MOD SED  Result Date: 05/27/2021 INDICATION: 65 year old gentleman with lower extremity DVT and acute intracranial hemorrhage presents to IR for IVC filter placement EXAM: Retrievable IVC  filter placement utilizing fluoroscopic and ultrasound guidance MEDICATIONS: None. ANESTHESIA/SEDATION: One mg IV Versed; 50 mcg IV Fentanyl Moderate Sedation Time:  12 minutes The patient was continuously monitored during the procedure by  the interventional radiology nurse under my direct supervision. FLUOROSCOPY TIME:  Fluoroscopy Time: 1 minutes 6 seconds (9 mGy). COMPLICATIONS: None immediate. PROCEDURE: Informed written consent was obtained from the patient after a thorough discussion of the procedural risks, benefits and alternatives. All questions were addressed. Maximal Sterile Barrier Technique was utilized including caps, mask, sterile gowns, sterile gloves, sterile drape, hand hygiene and skin antiseptic. A timeout was performed prior to the initiation of the procedure. Patient positioned supine on the angiography table. Right neck prepped and draped in usual sterile fashion. All elements of maximal sterile barrier were utilized including, cap, mask, sterile gown, sterile gloves, large sterile drape, hand scrubbing and 2% Chlorhexidine for skin cleaning. The right internal jugular vein was evaluated with ultrasound and shown to be patent. A permanent ultrasound image was obtained and placed in the patient's medical record. Using sterile gel and a sterile probe cover, the right internal jugular vein was entered with a 21 ga needle during real time ultrasound guidance. 21 gauge needle exchanged for a transitional dilator set over 0.018 inch guidewire. Transitional dilator set exchanged for 10 fr sheath over 0.035 inch guidewire. Sheath placed to the infrarenal IVC and CO2 venogram performed to delineate the position of the renal veins. Retrievable Denali IVC filter deployed in the infrarenal location. Sheath removed and hemostasis achieved with manual compression. IMPRESSION: Retrievable Denali IVC filter placed. PLAN: This IVC filter is potentially retrievable. The patient will be assessed for filter  retrieval by Interventional Radiology in approximately 8-12 weeks. Further recommendations regarding filter retrieval, continued surveillance or declaration of device permanence, will be made at that time. Electronically Signed   By: Acquanetta Belling M.D.   On: 05/27/2021 16:38   IR US Guide Vasc Access Right  Result Date: 05/27/2021 INDICATION: 65 year old gentleman with lower extremity DVT and acute intracranial hemorrhage presents to IR for IVC filter placement EXAM: Retrievable IVC filter placement utilizing fluoroscopic and ultrasound guidance MEDICATIONS: None. ANESTHESIA/SEDATION: One mg IV Versed; 50 mcg IV Fentanyl Moderate Sedation Time:  12 minutes The patient was continuously monitored during the procedure by the interventional radiology nurse under my direct supervision. FLUOROSCOPY TIME:  Fluoroscopy Time: 1 minutes 6 seconds (9 mGy). COMPLICATIONS: None immediate. PROCEDURE: Informed written consent was obtained from the patient after a thorough discussion of the procedural risks, benefits and alternatives. All questions were addressed. Maximal Sterile Barrier Technique was utilized including caps, mask, sterile gowns, sterile gloves, sterile drape, hand hygiene and skin antiseptic. A timeout was performed prior to the initiation of the procedure. Patient positioned supine on the angiography table. Right neck prepped and draped in usual sterile fashion. All elements of maximal sterile barrier were utilized including, cap, mask, sterile gown, sterile gloves, large sterile drape, hand scrubbing and 2% Chlorhexidine for skin cleaning. The right internal jugular vein was evaluated with ultrasound and shown to be patent. A permanent ultrasound image was obtained and placed in the patient's medical record. Using sterile gel and a sterile probe cover, the right internal jugular vein was entered with a 21 ga needle during real time ultrasound guidance. 21 gauge needle exchanged for a transitional dilator set  over 0.018 inch guidewire. Transitional dilator set exchanged for 10 fr sheath over 0.035 inch guidewire. Sheath placed to the infrarenal IVC and CO2 venogram performed to delineate the position of the renal veins. Retrievable Denali IVC filter deployed in the infrarenal location. Sheath removed and hemostasis achieved with manual compression. IMPRESSION: Retrievable Denali IVC filter placed. PLAN: This IVC filter  is potentially retrievable. The patient will be assessed for filter retrieval by Interventional Radiology in approximately 8-12 weeks. Further recommendations regarding filter retrieval, continued surveillance or declaration of device permanence, will be made at that time. Electronically Signed   By: Acquanetta Belling M.D.   On: 05/27/2021 16:38   DG CHEST PORT 1 VIEW  Result Date: 05/24/2021 CLINICAL DATA:  Leukocytosis EXAM: PORTABLE CHEST 1 VIEW COMPARISON:  04/28/15 FINDINGS: Cardiac shadow is within normal limits. Mild aortic calcifications are noted. The lungs are well aerated bilaterally with minimal left basilar atelectasis. No bony abnormality is seen. IMPRESSION: Minimal left basilar atelectasis. Electronically Signed   By: Alcide Clever M.D.   On: 05/24/2021 10:52   DG Abd Portable 1V  Result Date: 05/24/2021 CLINICAL DATA:  Feeding tube placement EXAM: PORTABLE ABDOMEN - 1 VIEW COMPARISON:  05/20/2021 FINDINGS: Limited radiograph of the lower chest and upper abdomen was obtained for the purposes of enteric tube localization. Enteric tube is seen coursing below the diaphragm with distal tip terminating within the expected location of the gastric body. Air-filled large and small bowel loops throughout the abdomen, similar to prior. IMPRESSION: Enteric tube tip terminates within the gastric body. Electronically Signed   By: Duanne Guess D.O.   On: 05/24/2021 13:50   EEG adult  Result Date: 05/23/2021 Charlsie Quest, MD     05/23/2021  4:11 PM Patient Name: Rigoberto Repass MRN:  [id number] Epilepsy Attending: Charlsie Quest Referring Physician/Provider: Dr Bing Neighbors Date: 05/23/2021 Duration: 25.36 mins Patient history:  65 y.o. male currently admitted for ICH on 05/11/21 developed somnolence and aphasia with R gaze deviation that resolved with ativan. EEG to evaluate for seizure Level of alertness: asleep AEDs during EEG study: LEV Technical aspects: This EEG study was done with scalp electrodes positioned according to the 10-20 International system of electrode placement. Electrical activity was acquired at a sampling rate of  and reviewed with a high frequency filter of  and a low frequency filter of . EEG data were recorded continuously and digitally stored. Description: No posterior dominant rhythm was seen. Sleep was characterized by sleep spindles (12 to 14 Hz), maximal frontocentral region. EEG showed continuous generalized 3 to 6 Hz theta-delta slowing. There is an excessive amount of 15 to 18 Hz, 2-3 uV beta activity distributed symmetrically and diffusely.  Hyperventilation and photic stimulation were not performed.   ABNORMALITY - Continuous slow, generalized - Excessive beta, generalized IMPRESSION: This study is suggestive of moderate to severe diffuse encephalopathy, nonspecific etiology. The excessive beta activity seen in the background is most likely due to the effect of benzodiazepine and is a benign EEG pattern. No seizures or epileptiform discharges were seen throughout the recording. Priyanka Annabelle Harman   Overnight EEG with video  Result Date: 05/24/2021 Charlsie Quest, MD     05/25/2021  9:42 AM Patient Name: Kentaro Alewine MRN: [id number] Epilepsy Attending: Charlsie Quest Referring Physician/Provider: Dr Bing Neighbors Duration: 05/23/2021 1655 to 05/24/2021 1655  Patient history:  65 y.o. male currently admitted for ICH on 05/11/21 developed somnolence and aphasia with R gaze deviation that resolved with ativan. EEG to evaluate for seizure  Level  of alertness: awake, asleep  AEDs during EEG study: LEV  Technical aspects: This EEG study was done with scalp electrodes positioned according to the 10-20 International system of electrode placement. Electrical activity was acquired at a sampling rate of  and reviewed with a high frequency filter of  and a low frequency  filter of 1Hz . EEG data were recorded continuously and digitally stored.  Description: No posterior dominant rhythm was seen. Sleep was characterized by sleep spindles (12 to 14 Hz), asymmetry ( R<L) maximal frontocentral region. EEG showed continuous generalized and lateralized right hemisphere 3 to 6 Hz theta-delta slowing. Hyperventilation and photic stimulation were not performed.    ABNORMALITY - Continuous slow, generalized and lateralized right hemisphere - Spindle asymmetry, right<left  IMPRESSION: This study is suggestive of cortical dysfunction in right hemisphere likely secondary to underlying structural abnormality.  Additionally there is moderate to severe diffuse encephalopathy, nonspecific etiology. No seizures or definite epileptiform discharges were seen throughout the recording.   Charlsie Questriyanka O Yadav   MR MRV HEAD W WO CONTRAST  Result Date: 05/30/2021 CLINICAL DATA:  Right parenchymal hemorrhage.  Abnormal arteriogram. EXAM: MR VENOGRAM HEAD WITHOUT AND WITH CONTRAST TECHNIQUE: Angiographic images of the intracranial venous structures were acquired using MRV technique without and with intravenous contrast. CONTRAST:  6.595mL GADAVIST GADOBUTROL 1 MMOL/ML IV SOLN COMPARISON:  Cerebral arteriogram 05/28/2021. MR venogram 05/12/2021. FINDINGS: Stable appearance of dominant left transverse sinus. Postcontrast images demonstrate no thrombus. Straight sinus and deep cerebral veins are intact. Cortical veins are unremarkable. Superior sagittal sinus normal. Sagittal sinus and intra on the jugular veins are normal bilaterally. IMPRESSION: Normal variant MR venogram without  significant change. No sinus thrombosis. Electronically Signed   By: Marin Robertshristopher  Mattern M.D.   On: 05/30/2021 12:49   MR MRV HEAD W WO CONTRAST  Result Date: 05/12/2021 CLINICAL DATA:  Intracranial hemorrhage. EXAM: MR VENOGRAM HEAD WITHOUT AND WITH CONTRAST TECHNIQUE: Angiographic images of the intracranial venous structures were acquired using MRV technique without and with intravenous contrast. CONTRAST:  7mL GADAVIST GADOBUTROL 1 MMOL/ML IV SOLN COMPARISON:  Brain MRI performed earlier today 05/12/2021. Noncontrast head CT examinations 05/11/2021. CT angiogram head/neck 05/11/2021. FINDINGS: The superior sagittal sinus, internal cerebral veins, vein of Galen, straight sinus, transverse sinuses, sigmoid sinuses and visualized jugular veins are patent. No appreciable intracranial venous thrombosis. Hypoplastic right transverse and sigmoid dural venous sinuses. An acute parenchymal hemorrhage within the posterior right frontal lobe is grossly unchanged in size as compared to the brain MRI performed earlier today. There is mild curvilinear vascular enhancement overlying the site of hemorrhage, which may be reactive. Elsewhere, no abnormal intracranial enhancement is identified. IMPRESSION: No evidence of intracranial venous thrombosis. Non dominant right transverse and sigmoid dural venous sinuses. Electronically Signed   By: Jackey LogeKyle  Golden DO   On: 05/12/2021 11:27   ECHOCARDIOGRAM COMPLETE  Result Date: 05/11/2021    ECHOCARDIOGRAM REPORT   [facility] Date of Exam: 05/11/2021 Medical Rec #:  [id number]        Height:       68.0 in Accession #:    [id number]       Weight:       155.2 lb Date of Birth:  05/09/1956         BSA:          1.835 m Patient Age:    65 years         BP:           115/77 mmHg Patient Gender: M                HR:           84 bpm. Exam Location:  Inpatient Procedure: 2D Echo, Cardiac Doppler and Color Doppler Indications:    Stroke I63.9  History:  Patient has  no prior history of Echocardiogram examinations.  Sonographer:    Roosvelt Maser RDCS Referring Phys: [id number] Reyne Dumas [facility] IMPRESSIONS  1. Left ventricular ejection fraction, by estimation, is 65 to 70%. The left ventricle has normal function. The left ventricle has no regional wall motion abnormalities. Left ventricular diastolic parameters were normal.  2. Right ventricular systolic function is normal. The right ventricular size is normal. Tricuspid regurgitation signal is inadequate for assessing PA pressure.  3. The mitral valve is normal in structure. No evidence of mitral valve regurgitation.  4. The aortic valve was not well visualized. Aortic valve regurgitation is not visualized. No aortic stenosis is present.  5. The inferior vena cava is normal in size with greater than 50% respiratory variability, suggesting right atrial pressure of 3 mmHg. FINDINGS  Left Ventricle: Left ventricular ejection fraction, by estimation, is 65 to 70%. The left ventricle has normal function. The left ventricle has no regional wall motion abnormalities. The left ventricular internal cavity size was normal in size. There is  no left ventricular hypertrophy. Left ventricular diastolic parameters were normal. Right Ventricle: The right ventricular size is normal. No increase in right ventricular wall thickness. Right ventricular systolic function is normal. Tricuspid regurgitation signal is inadequate for assessing PA pressure. Left Atrium: Left atrial size was normal in size. Right Atrium: Right atrial size was normal in size. Pericardium: There is no evidence of pericardial effusion. Mitral Valve: The mitral valve is normal in structure. No evidence of mitral valve regurgitation. Tricuspid Valve: The tricuspid valve is normal in structure. Tricuspid valve regurgitation is not demonstrated. Aortic Valve: The aortic valve was not well visualized. Aortic valve regurgitation is not visualized. No aortic stenosis is present. Aortic  valve mean gradient measures 3.0 mmHg. Aortic valve peak gradient measures 6.7 mmHg. Aortic valve area, by VTI measures 2.77 cm. Pulmonic Valve: The pulmonic valve was not well visualized. Pulmonic valve regurgitation is not visualized. Aorta: The aortic root and ascending aorta are structurally normal, with no evidence of dilitation. Venous: The inferior vena cava is normal in size with greater than 50% respiratory variability, suggesting right atrial pressure of 3 mmHg. IAS/Shunts: The interatrial septum was not well visualized.  LEFT VENTRICLE PLAX 2D LVIDd:         4.90 cm  Diastology LVIDs:         2.80 cm  LV e' medial:    8.38 cm/s LV PW:         0.80 cm  LV E/e' medial:  9.0 LV IVS:        0.90 cm  LV e' lateral:   9.14 cm/s LVOT diam:     1.90 cm  LV E/e' lateral: 8.2 LV SV:         69 LV SV Index:   37 LVOT Area:     2.84 cm  RIGHT VENTRICLE RV Basal diam:  2.90 cm LEFT ATRIUM           Index       RIGHT ATRIUM           Index LA diam:      3.10 cm 1.69 cm/m  RA Area:     11.20 cm LA Vol (A2C): 26.8 ml 14.61 ml/m RA Volume:   21.90 ml  11.94 ml/m LA Vol (A4C): 39.2 ml 21.36 ml/m  AORTIC VALVE AV Area (Vmax):    2.64 cm AV Area (Vmean):   2.59 cm AV Area (VTI):  2.77 cm AV Vmax:           129.00 cm/s AV Vmean:          86.700 cm/s AV VTI:            0.248 m AV Peak Grad:      6.7 mmHg AV Mean Grad:      3.0 mmHg LVOT Vmax:         120.00 cm/s LVOT Vmean:        79.200 cm/s LVOT VTI:          0.242 m LVOT/AV VTI ratio: 0.98  AORTA Ao Root diam: 2.50 cm Ao Asc diam:  3.30 cm MITRAL VALVE MV Area (PHT): 3.60 cm     SHUNTS MV Decel Time: 211 msec     Systemic VTI:  0.24 m MV E velocity: 75.30 cm/s   Systemic Diam: 1.90 cm MV A velocity: 107.00 cm/s MV E/A ratio:  0.70 Epifanio Lesches MD Electronically signed by Epifanio Lesches MD Signature Date/Time: 05/11/2021/5:28:15 PM    Final    CT HEAD CODE STROKE WO CONTRAST  Result Date: 05/23/2021 CLINICAL DATA:  Code stroke.  Acute stroke.  EXAM: CT HEAD WITHOUT CONTRAST TECHNIQUE: Contiguous axial images were obtained from the base of the skull through the vertex without intravenous contrast. COMPARISON:  Head CT from 05/11/2021 FINDINGS: Brain: 5.5 x 2.8 cm hematoma in the subcortical high right frontal region with extensive vasogenic edema in the adjacent brain that is progressed. There is worsening local mass effect with midline shift now measuring 13 mm. No entrapment. No cytotoxic edema seen. Vascular: No hyperdense vessel or unexpected calcification. Skull: Normal. Negative for fracture or focal lesion. Sinuses/Orbits: Negative Other: Critical Value/emergent results were called by telephone at the time of interpretation on 05/23/2021 at 9:10 am to provider [facility] , who verbally acknowledged these results. ASPECTS [facility] Stroke Program Early CT Score) Not scored in this setting IMPRESSION: Progressed right frontal hematoma and vasogenic edema with midline shift now measuring 13 mm. Electronically Signed   By: Marnee Spring M.D.   On: 05/23/2021 09:12   CT HEAD CODE STROKE WO CONTRAST  Result Date: 05/11/2021 CLINICAL DATA:  Code stroke. Left-sided weakness, nausea, and vomiting. EXAM: CT HEAD WITHOUT CONTRAST TECHNIQUE: Contiguous axial images were obtained from the base of the skull through the vertex without intravenous contrast. COMPARISON:  None. FINDINGS: Brain: An acute parenchymal hemorrhage in the high posterior right frontal lobe measures 4.2 x 2.3 x 3.1 cm (approximate volume of 15 mL). There is mild surrounding edema without midline shift, and there is a small amount of adjacent subarachnoid hemorrhage. A very small amount of coexistent subdural hemorrhage is also not excluded in this region. No acute infarct is identified elsewhere. The ventricles are normal in size. Hypodensities in the cerebral white matter bilaterally are nonspecific but compatible with mild chronic small vessel ischemic disease. Vascular: No  hyperdense vessel. Skull: No fracture or suspicious osseous lesion. Sinuses/Orbits: Small osteoma in the left frontal sinus. Mild right and moderate left maxillary sinus mucosal thickening. Clear mastoid air cells. Unremarkable orbits. Other: None. ASPECTS [facility] Stroke Program Early CT Score) Not scored in the presence of acute hemorrhage. IMPRESSION: Acute parenchymal hemorrhage in the posterior right frontal lobe with small amount of adjacent subarachnoid hemorrhage. These results were communicated to Dr. Iver Nestle at 9:08 am on 05/11/2021 by text page via the [facility] messaging system. Electronically Signed   By: Sebastian Ache M.D.   On: 05/11/2021  09:12   VAS [place] LOWER EXTREMITY VENOUS (DVT)  Result Date: 05/26/2021  Lower Venous DVT Study Patient Name:  TAAVI HOOSE  Date of Exam:   05/26/2021 Medical Rec #: [id number]         Accession #:    [id number] Date of Birth: 10-19-1956          Patient Gender: M Patient Age:   65Y Exam Location:  [facility] Procedure:      VAS [place] LOWER EXTREMITY VENOUS (DVT) Referring Phys: [id number] Eliezer Champagne --------------------------------------------------------------------------------  Indications: Fever.  Limitations: Poor ultrasound/tissue interface. Comparison Study: No previous exams. Performing Technologist: Ernestene Mention RVT, RDMS  Examination Guidelines: A complete evaluation includes B-mode imaging, spectral Doppler, color Doppler, and power Doppler as needed of all accessible portions of each vessel. Bilateral testing is considered an integral part of a complete examination. Limited examinations for reoccurring indications may be performed as noted. The reflux portion of the exam is performed with the patient in reverse Trendelenburg.  +---------+---------------+---------+-----------+----------+--------------+ RIGHT    CompressibilityPhasicitySpontaneityPropertiesThrombus Aging +---------+---------------+---------+-----------+----------+--------------+  CFV      Full           Yes      Yes                                 +---------+---------------+---------+-----------+----------+--------------+ SFJ      Full                                                        +---------+---------------+---------+-----------+----------+--------------+ FV Prox  Full           Yes      Yes                                 +---------+---------------+---------+-----------+----------+--------------+ FV Mid   Full           Yes      Yes                                 +---------+---------------+---------+-----------+----------+--------------+ FV DistalFull           Yes      Yes                                 +---------+---------------+---------+-----------+----------+--------------+ PFV      Full                                                        +---------+---------------+---------+-----------+----------+--------------+ POP      Full           Yes      Yes                                 +---------+---------------+---------+-----------+----------+--------------+ PTV      Full                                                        +---------+---------------+---------+-----------+----------+--------------+  PERO     Full                                                        +---------+---------------+---------+-----------+----------+--------------+   +---------+---------------+---------+-----------+----------+-------------------+ LEFT     CompressibilityPhasicitySpontaneityPropertiesThrombus Aging      +---------+---------------+---------+-----------+----------+-------------------+ CFV      Partial        Yes      Yes                  Acute- non                                                                occlusive           +---------+---------------+---------+-----------+----------+-------------------+ SFJ      Full                                                              +---------+---------------+---------+-----------+----------+-------------------+ FV Prox  Full           Yes      Yes                                      +---------+---------------+---------+-----------+----------+-------------------+ FV Mid   Full           Yes      Yes                                      +---------+---------------+---------+-----------+----------+-------------------+ FV DistalFull           Yes      Yes                                      +---------+---------------+---------+-----------+----------+-------------------+ PFV      Full                                                             +---------+---------------+---------+-----------+----------+-------------------+ POP      Full           Yes      Yes                                      +---------+---------------+---------+-----------+----------+-------------------+ PTV  Not well visualized +---------+---------------+---------+-----------+----------+-------------------+ PERO     Full                                                             +---------+---------------+---------+-----------+----------+-------------------+ EIV                     Yes      Yes                  Patent by color and                                                       doppler             +---------+---------------+---------+-----------+----------+-------------------+     Summary: BILATERAL: - No evidence of superficial venous thrombosis in the lower extremities, bilaterally. -No evidence of popliteal cyst, bilaterally. RIGHT: - There is no evidence of deep vein thrombosis in the lower extremity.  LEFT: - Findings consistent with acute deep vein thrombosis involving the left common femoral vein.  *See table(s) above for measurements and observations. Electronically signed by Coral Else MD on 05/26/2021 at 7:50:00 PM.    Final    CT ANGIO HEAD  NECK W WO CM (CODE STROKE)  Result Date: 05/23/2021 CLINICAL DATA:  Enlarging hematoma EXAM: CT ANGIOGRAPHY HEAD AND NECK TECHNIQUE: Multidetector CT imaging of the head and neck was performed using the standard protocol during bolus administration of intravenous contrast. Multiplanar CT image reconstructions and MIPs were obtained to evaluate the vascular anatomy. Carotid stenosis measurements (when applicable) are obtained utilizing NASCET criteria, using the distal internal carotid diameter as the denominator. CONTRAST:  52mL OMNIPAQUE IOHEXOL 350 MG/ML SOLN COMPARISON:  CTA 05/11/2021 FINDINGS: CTA NECK FINDINGS Aortic arch: Normal Right carotid system: Widely patent vessels with smooth contour. No atheromatous changes Left carotid system: Widely patent vessels with smooth contour. No noted atheromatous changes Vertebral arteries: No proximal subclavian stenosis. The vertebral arteries are widely patent. No suspected vertebral beading when allowing for streak artifact. Skeleton: Multilevel degenerative disc narrowing and ridging canal and foraminal encroachment the lower cervical levels. Other neck: No evidence of inflammation or mass. Upper chest: Negative Review of the MIP images confirms the above findings CTA HEAD FINDINGS Anterior circulation: Major vessels are smooth and widely patent. No branch occlusion, generalized beading, or aneurysm. There is a cortical branch at the right frontal parietal convexity which is not continuous beyond the level of the upper and lateral hematoma. No visible nidus or early enhancing veins. Posterior circulation: Vertebral and basilar arteries are smooth and widely patent. No branch occlusion, beading, or aneurysm. Venous sinuses: MRV was obtained previously. The veins are largely nonenhancing on this study. Anatomic variants: None significant Review of the MIP images confirms the above findings IMPRESSION: Seemingly truncated arterial cortical branch at the upper and  lateral hematoma, without discrete vascular malformation or spot sign. Recommend follow-up after resolution of mass effect to evaluate for small AVM. Electronically Signed   By: Marnee Spring M.D.   On: 05/23/2021 09:54   CT ANGIO HEAD NECK W WO CM (CODE STROKE)  Result Date:  05/11/2021 CLINICAL DATA:  Stroke follow-up.  Left-sided weakness. EXAM: CT ANGIOGRAPHY HEAD AND NECK TECHNIQUE: Multidetector CT imaging of the head and neck was performed using the standard protocol during bolus administration of intravenous contrast. Multiplanar CT image reconstructions and MIPs were obtained to evaluate the vascular anatomy. Carotid stenosis measurements (when applicable) are obtained utilizing NASCET criteria, using the distal internal carotid diameter as the denominator. CONTRAST:  50mL OMNIPAQUE IOHEXOL 350 MG/ML SOLN COMPARISON:  Same day CT head. FINDINGS: CTA NECK FINDINGS Aortic arch: Great vessel origins are patent. Right carotid system: No evidence of dissection, stenosis (50% or greater) or occlusion. Mild apparent irregularity of the ICA at the skull base in a region of streak artifact. Left carotid system: No evidence of dissection, stenosis (50% or greater) or occlusion. Mild apparent irregularity of the ICA at the skull base in a region of streak artifact. Vertebral arteries: Codominant. No evidence of dissection, stenosis (50% or greater) or occlusion. Skeleton: Moderate degenerative disc disease at at C5-C6 and C6-C7 with disc height loss, endplate sclerosis and posterior disc osteophyte complexes. Other neck: No acute abnormality Upper chest: Visualized lung apices are clear. Review of the MIP images confirms the above findings CTA HEAD FINDINGS Anterior circulation: No large vessel occlusion or proximal hemodynamically significant stenosis. Evaluation of the distal vasculature is limited due to venous contamination. No evidence of and arteriovenous malformation or aneurysm in the region of  intraparenchymal hemorrhage, although acute blood products limit evaluation. Posterior circulation: No large vessel occlusion or proximal hemodynamically significant stenosis. Venous sinuses: The superior sagittal sinus is narrowed in the region of hemorrhage, most likely from mass effect. Small right transverse and sigmoid sinuses. Review of the MIP images confirms the above findings IMPRESSION: CTA Head: 1. No large vessel occlusion or hemodynamically significant proximal arterial stenosis. 2. The superior sagittal sinus is narrowed in the region of hemorrhage with poorly visualized draining cortical veins in this region. While indeterminate, this may be secondary to mass effect given no definite/clear intraluminal thrombus. Given these findings and the location of hemorrhage; however, recommend low threshold for follow-up CTV or MRI with contrast to ensure stability and exclude worsening thrombosis. 3. No evidence of and arteriovenous malformation or aneurysm in the region of intraparenchymal hemorrhage, although acute blood products limit evaluation. Follow-up imaging after resolution of hemorrhage could provide further assessment if clinically indicated. CTA Neck: 1. No significant (greater than 50%) stenosis. 2. Mild apparent irregularity of bilateral ICAs at the skull base is favored artifactual given streak artifact from dental amalgam in this region. Fibromuscular dysplasia is a differential consideration. Findings and recommendations discussed with Dr. Iver Nestle via telephone at 9:59 a.m. Electronically Signed   By: Feliberto Harts MD   On: 05/11/2021 10:09     PHYSICAL EXAM  Temp:  [98.1 F (36.7 C)-99.2 F (37.3 C)] 99.2 F (37.3 C) (06/19 0755) Pulse Rate:  [88-115] 103 (06/19 1100) Resp:  [12-23] 18 (06/19 1100) BP: (118-158)/(75-103) 152/80 (06/19 1100) SpO2:  [93 %-100 %] 98 % (06/19 1100)  General - Well nourished, well developed, middle-aged African-American male sleepy but  arousable.  Ophthalmologic - fundi not visualized due to noncooperation.  Cardiovascular - Regular rhythm and mild tachycardia.  Neuro - sleepy but eyes open with voice, he was able to follow peripheral commands on the right hand and most central commands.  Moderate dysarthria, oriented to place and people age but not to time. Right gaze preference, but unable to cross midline. Not blinking to visual threat left  greater than right. Left facial droop. Tongue protrusion not cooperative. Right UE spontaneous movement against gravity and RLE some spontaneous movement but not against gravity, LUE and LLE plegic with increased muscle tone. Sensation, coordination not cooperative and gait not tested.   ASSESSMENT/PLAN Mr. Clyde Zarrella is a 65 y.o. male with history of dementia with behavioral disturbance and OSA on CPAP admitted for right gaze and left hemoplegia, left facial droop. 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 He was stabilized and sent to CIR. 6/12 found to be lethargic and drowsy, CT repeat showed increased cerebral edema and midline shift. EEG no seizure s/p ativan and keppra. He was transfer back to ICU for further management.   Encephalopathy  Improving Likely combination of cerebral edema, ? Seiziure, fever, leukocytosis and AKI Treat underlying condition as below Neuro check closely  Cerebral edema  CT head 6/12 showed progression of right frontal ICH and cerebral edema with midline shift CTA head and neck 6/12 no discrete AVM or spot sign CT repeat 6/16 unchanged, no progression of midline shift or hematoma. On 3% saline 50->65->30 cc/h->off Na Q6h Na 135->140->144->151->158->155->156->157>158 Na goal 145-155  ? 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 No CSVT on venous phase CT repeat mildly increased left frontal ICH 05/12/21 MRI brain no significant change of ICH since last CT MRV Non dominant right transverse and sigmoid dural venous sinuses. CT repeat 6/16 unchanged, no progression of midline shift or hematoma. Cerebral angiogram no AVM or aneurysm, concerning for Poor to no sig opacification of RT transverse sinus in its entirety, ?Suspicious for thrombotic occlusion. 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  No evidence of clot on venous sinus thrombosis.   2D Echo  EF 65-70% LDL 140 HgbA1c 5.8 UDS neg Heparin subq for VTE prophylaxis No antithrombotic prior to admission, now on No antithrombotic given ICH Ongoing aggressive stroke risk factor management Therapy recommendations:  pending Disposition:  Pending   ?? 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  DVT LE venous Doppler showed left common femoral vein DVT Not a candidate for anticoagulation at this time S/p IVC filter 6/16  Dementia  Has appointment with [facility] neurology for LP and evaluation of dementia, but pt not able to go due to onset of ICH Delirium precaution Continue outpt follow up with [facility]  neurology  Hyperlipidemia Home meds:  none  LDL 140, goal < 70 Will consider low dose statin (lipitor 20) on discharge   Dysphagia  Speech to follow NPO  S/p cortrak on TF @ 50  Fever  and leukocytosis Tmax 101.4->102.6->100.9->102.4->101.7->afebrile Leukocytosis WBC 7.6->12.4 ->8.1->7.8->22.9->20.4->24.8-> 23.9->17.7 CCM on board Continue monitoring UA neg CXR Minimal left basilar atelectasis On Rocephin now  Other Stroke Risk Factors Advanced age Obstructive sleep apnea, on CPAP at home  Other Active Problems AKI Cre 1.20->1.45->1.03->1.02->0.98->1.09 - on IVF and TF  Hospital day # 7 Patient continues to show some progress.  MR venogram shows no evidence of thrombosis and the transverse sinus with this likely congenitally absent.  Continue mobilization out of bed and ongoing therapies.  Transfer to neurology stepdown bed available.  Long discussion with patient and wife at the bedside and answered questions.  Discussed with Dr. Ella Jubilee  This patient is critically ill due to right frontal ICH, cerebral edema, mass-effect with midline shift, DVT, possible seizure, fever and leukocytosis and at significant risk of neurological worsening, death form sepsis, septic shock, status epilepticus, brain herniation, heart failure and respiratory failure. This patient's care requires constant monitoring of vital signs, hemodynamics, respiratory and cardiac monitoring, review of multiple databases, neurological assessment, discussion with family, other specialists and medical decision making of high complexity. I spent 30 minutes of neurocritical care time in the care of this patient.  I discussed with   Dr. Merrily Pew.   Delia Heady, MD   05/30/2021 1:22 PM    To contact Stroke Continuity provider, please refer to [email or web address]. After hours, contact General Neurology

## 2021-05-30 NOTE — Progress Notes (Signed)
Inpatient Rehab Admissions Coordinator Note:   Per PT/OT recommendations, pt was screened for CIR candidacy by Wolfgang Phoenix, MS, CCC-SLP.  At this time we are recommending an inpatient rehab consult.  AC will place consult order per protocol.  Please contact me with questions.    Wolfgang Phoenix, MS, CCC-SLP Admissions Coordinator [phone] 05/30/21 5:26 PM

## 2021-05-30 NOTE — Progress Notes (Addendum)
PROGRESS NOTE    Donald [name]  WOE:[id number] DOB: Aug 13, 1956 DOA: 05/23/2021 PCP: [name], MD    Brief Narrative:  Mr. Mishkin was admitted to the hospital with the working diagnosis of acute worsening intracranial hemorrhage of the right frontal lobe.   65 year old male with recent intracranial hemorrhage who presents from inpatient rehab due to altered mentation and worsening focal neurologic deficits.  Originally admitted to the hospital May 31 with intracranial hemorrhage of the posterior right frontal lobe.  He was transferred to inpatient rehab for further care.  While at the rehab he had acute change in his mentation with difficulty swallowing and facial droop.  Stat head CT showed right intraparenchymal hemorrhage with worsening midline shift.  He was transported to neuro ICU for further evaluation.  His blood pressure was 135/77, temperature 97.5, respiratory rate 18, oxygen saturation 100%, his lungs are clear to auscultation bilaterally, heart S1-S2, present, rhythmic, soft abdomen, no lower extremity edema.  Patient was responsive to noxious stimuli but not verbal or touch.  Not following commands.  Patient admitted to the neuro ICU, for further close neurologic monitoring.  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  Patient was placed on Keppra for seizure prophylaxis.  Medical treatment with hypertonic saline for intracranial hypertension.  He was found to have a right femoral DVT and an IVC filter was placed (06/16).  He was diagnosed with left lower lobe pneumonia (not present on admission) and received antibiotic therapy with ceftriaxone.  Assessment & Plan:   Principal Problem:   Intracerebral hemorrhage Active Problems:   Hypokalemia   Malnutrition of moderate degree   Seizure (HCC)   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HCC)   Right lower lobe pneumonia   Prediabetes   Hyperglycemia   Intracranial hemorrhage  right frontal lobe. Patient this am continue to be somnolent, he wakes up to pain stimuli, opening his eyes and following simple commands on the right side, continue to have paresis of the left side.   Plan to continue neuro checks, PT, OT and speech. For now nutritional support with tube feedings (NG).  Transfer to progressive care unit.  On Keppra for seizure prophylaxis, no active seizures.   Follow with MRV and further neurology recommendations.   2. Right femoral DVT. Not candidate for anticoagulation. He had a IVC filter placed on 05/27/21.   3. Left lower lobe pneumonia. Likely aspiration pneumonia. Oxygenation is 99% on room air.  Continue aspiration precautions and had short course of antibiotic therapy with IV ceftriaxone.   4. Pre-diabetes Hgb A1c 5,8 complicated with hyperglycemia. Moderate calorie protein malnutrition. Continue glucose cover and monitoring with insulin sliding scale.  Continue on tube feedings and aspiration precautions.   5. Hypokalemia. Continue tube feedings for nutrition, K and renal function have been stable.   Patient continue to be at high risk for worsening intracranial bleeding.   Status is: Inpatient  Remains inpatient appropriate because:Inpatient level of care appropriate due to severity of illness  Dispo: The patient is from:  CIR              Anticipated d/c is to: CIR              Patient currently is not medically stable to d/c.   Difficult to place patient No   DVT prophylaxis: Scd   Code Status:   full  Family Communication:  I spoke with patient's wife  at the bedside, we talked  in detail about patient's condition, plan of care and prognosis and all questions were addressed.      Nutrition Status: Nutrition Problem: Moderate Malnutrition Etiology: chronic illness Signs/Symptoms: mild fat depletion, mild muscle depletion, moderate muscle depletion Interventions: Tube feeding     Consultants:  Neurology  Interventional  Radiology   Procedures:  IVC filter   Antimicrobials:  Ceftriaxone     Subjective: Patient somnolent today, and hyporeactive, tolerating tube feedings, has mitten on right hand to protect NG and IV,s  Objective: Vitals:   05/30/21 0800 05/30/21 0900 05/30/21 1000 05/30/21 1100  BP: (!) 145/88 127/80 118/75 (!) 152/80  Pulse: (!) 102 90 88 (!) 103  Resp: 16 12 13 18   Temp:      TempSrc:      SpO2: 97% 98% 98% 98%  Weight:        Intake/Output Summary (Last 24 hours) at 05/30/2021 1254 Last data filed at 05/30/2021 1200 Gross per 24 hour  Intake 1674.88 ml  Output 950 ml  Net 724.88 ml   Filed Weights   05/26/21 0359 05/28/21 0440 05/29/21 0400  Weight: 66 kg 63.4 kg 65.6 kg    Examination:   General: deconditioned and ill looking appearing  Neurology: right sided hemiparesis, left hand with mitten in place. He is somnolent but opens eyes to brief pain stimuli, follows simple commands.  E ENT: positive pallor, no icterus, oral mucosa moist Cardiovascular: No JVD. S1-S2 present, rhythmic, no gallops, rubs, or murmurs. No lower extremity edema. Pulmonary: positive breath sounds bilaterally,with no wheezing, rhonchi or rales. Gastrointestinal. Abdomen soft and non tender Skin. No rashes Musculoskeletal: no joint deformities     Data Reviewed: I have personally reviewed following labs and imaging studies  CBC: Recent Labs  Lab 05/25/21 0141 05/26/21 0335 05/27/21 0300 05/28/21 0450 05/29/21 0617  WBC 20.4* 24.8* 23.9* 17.7* 12.9*  HGB 14.7 14.0 14.3 15.6 13.5  HCT 44.1 43.9 44.6 49.7 43.8  MCV 90.9 94.4 93.3 95.4 95.4  PLT 268 360 425* 492* 434*   Basic Metabolic Panel: Recent Labs  Lab 05/24/21 0134 05/24/21 0838 05/24/21 1604 05/24/21 1929 05/25/21 0141 05/25/21 0743 05/25/21 1429 05/25/21 2306 05/26/21 0335 05/26/21 0912 05/27/21 0300 05/27/21 1637 05/28/21 0450 05/28/21 1636 05/29/21 0617 05/29/21 1655 05/30/21 0543  NA 135   < > 141    < > 144  145   < > 154*   < > 158*   < > 156*   < > 157* 158* 158* 160* 156*  K 4.4  --   --   --  4.4  --   --   --  4.0  --  4.2  --  3.8  --  3.6  --   --   CL 96*  --   --   --  115*  --   --   --  121*  --  119*  --  114*  --  124*  --   --   CO2 27  --   --   --  23  --   --   --  28  --  30  --  31  --  27  --   --   GLUCOSE 135*  --   --   --  200*  --   --   --  168*  --  93  --  134*  --  250*  --   --   BUN 27*  --   --   --  29*  --   --   --  23  --  25*  --  26*  --  25*  --   --   CREATININE 1.45*  --   --   --  1.03  --   --   --  1.02  --  0.98  --  1.09  --  0.98  --   --   CALCIUM 10.1  --   --   --  9.2  --   --   --  9.7  --  9.8  --  10.4*  --  9.4  --   --   MG 2.5*  --  2.5*  --  2.6*  --  2.5*  --   --   --   --   --   --   --   --   --   --   PHOS 3.6  --  3.2  --  2.1*  --  1.9*  --   --   --   --   --   --   --   --   --   --    < > = values in this interval not displayed.   GFR: Estimated Creatinine Clearance: 66.2 mL/min (by C-G formula based on SCr of 0.98 mg/dL). Liver Function Tests: No results for input(s): AST, ALT, ALKPHOS, BILITOT, PROT, ALBUMIN in the last 168 hours. No results for input(s): LIPASE, AMYLASE in the last 168 hours. No results for input(s): AMMONIA in the last 168 hours. Coagulation Profile: No results for input(s): INR, PROTIME in the last 168 hours. Cardiac Enzymes: No results for input(s): CKTOTAL, CKMB, CKMBINDEX, TROPONINI in the last 168 hours. BNP (last 3 results) No results for input(s): PROBNP in the last 8760 hours. HbA1C: No results for input(s): HGBA1C in the last 72 hours. CBG: Recent Labs  Lab 05/29/21 1525 05/29/21 2004 05/29/21 2349 05/30/21 0343 05/30/21 0755  GLUCAP 155* 166* 197* 152* 179*   Lipid Profile: No results for input(s): CHOL, HDL, LDLCALC, TRIG, CHOLHDL, LDLDIRECT in the last 72 hours. Thyroid Function Tests: No results for input(s): TSH, T4TOTAL, FREET4, T3FREE, THYROIDAB in the last 72  hours. Anemia Panel: No results for input(s): VITAMINB12, FOLATE, FERRITIN, TIBC, IRON, RETICCTPCT in the last 72 hours.    Radiology Studies: I have reviewed all of the imaging during this hospital visit personally     Scheduled Meds:  chlorhexidine  15 mL Mouth Rinse BID   Chlorhexidine Gluconate Cloth  6 each Topical Daily   enoxaparin (LOVENOX) injection  40 mg Subcutaneous Q24H   feeding supplement (PROSource TF)  45 mL Per Tube Daily   free water  300 mL Per Tube Q6H   insulin aspart  0-9 Units Subcutaneous Q4H   levETIRAcetam  1,000 mg Per Tube BID   mouth rinse  15 mL Mouth Rinse q12n4p   Continuous Infusions:  cefTRIAXone (ROCEPHIN)  IV Stopped (05/29/21 1330)   feeding supplement (OSMOLITE 1.5 CAL) 1,000 mL (05/29/21 1436)     LOS: 7 days        Carlito Bogert Annett Gula, MD

## 2021-05-31 ENCOUNTER — Inpatient Hospital Stay (HOSPITAL_COMMUNITY): Payer: No Typology Code available for payment source

## 2021-05-31 LAB — CBC WITH DIFFERENTIAL/PLATELET
Abs Immature Granulocytes: 0.18 10*3/uL — ABNORMAL HIGH (ref 0.00–0.07)
Basophils Absolute: 0 10*3/uL (ref 0.0–0.1)
Basophils Relative: 0 %
Eosinophils Absolute: 0.2 10*3/uL (ref 0.0–0.5)
Eosinophils Relative: 2 %
HCT: 41.9 % (ref 39.0–52.0)
Hemoglobin: 12.9 g/dL — ABNORMAL LOW (ref 13.0–17.0)
Immature Granulocytes: 1 %
Lymphocytes Relative: 14 %
Lymphs Abs: 1.8 10*3/uL (ref 0.7–4.0)
MCH: 29.8 pg (ref 26.0–34.0)
MCHC: 30.8 g/dL (ref 30.0–36.0)
MCV: 96.8 fL (ref 80.0–100.0)
Monocytes Absolute: 0.6 10*3/uL (ref 0.1–1.0)
Monocytes Relative: 5 %
Neutro Abs: 10 10*3/uL — ABNORMAL HIGH (ref 1.7–7.7)
Neutrophils Relative %: 78 %
Platelets: 376 10*3/uL (ref 150–400)
RBC: 4.33 MIL/uL (ref 4.22–5.81)
RDW: 14.7 % (ref 11.5–15.5)
WBC: 12.8 10*3/uL — ABNORMAL HIGH (ref 4.0–10.5)
nRBC: 0 % (ref 0.0–0.2)

## 2021-05-31 LAB — GLUCOSE, CAPILLARY
Glucose-Capillary: 171 mg/dL — ABNORMAL HIGH (ref 70–99)
Glucose-Capillary: 168 mg/dL — ABNORMAL HIGH (ref 70–99)
Glucose-Capillary: 100 mg/dL — ABNORMAL HIGH (ref 70–99)
Glucose-Capillary: 213 mg/dL — ABNORMAL HIGH (ref 70–99)
Glucose-Capillary: 186 mg/dL — ABNORMAL HIGH (ref 70–99)

## 2021-05-31 LAB — BASIC METABOLIC PANEL
Anion gap: 8 (ref 5–15)
BUN: 20 mg/dL (ref 8–23)
CO2: 32 mmol/L (ref 22–32)
Calcium: 9.1 mg/dL (ref 8.9–10.3)
Chloride: 112 mmol/L — ABNORMAL HIGH (ref 98–111)
Creatinine, Ser: 1.09 mg/dL (ref 0.61–1.24)
GFR, Estimated: >60 mL/min (ref >60)
Glucose, Bld: 210 mg/dL — ABNORMAL HIGH (ref 70–99)
Potassium: 4.4 mmol/L (ref 3.5–5.1)
Sodium: 152 mmol/L — ABNORMAL HIGH (ref 135–145)

## 2021-05-31 MED ORDER — FREE WATER
250.0000 mL | Status: DC
  Administered 2021-05-31 – 2021-06-04 (×18): 250 mL

## 2021-05-31 NOTE — Progress Notes (Signed)
  Speech Language Pathology Treatment: Dysphagia;Cognitive-Linquistic  Patient Details Name: Donald [name] MRN: [id number] DOB: 22-Nov-1956 Today's Date: 05/31/2021 Time: 6213-0865 SLP Time Calculation (min) (ACUTE ONLY): 8 min  Assessment / Plan / Recommendation Clinical Impression  Pt seen for swallow, speech and cognition today in setting of significant lethargy after receiving Keppra. Since 6/14 therapy was cancelled due to decreased level of consciousness, NPO for filter and NPO for angiogram (when goal was initiate po if able). RN reports suspicion of possible aspiration of secretions and/or tube feeds (CXR clear per RN). On arrival pt exhibited significant gurgly verbalizations greatly affecting intelligibility without attempts to clear reflexively. SLP facilitated intelligibility and management of secretions with verbal cues and demonstration for cough and lateral and forward/back trunk movements to promote loosening secretions and cough ineffectively. Used oral suction instigating cough with productive mucous with continued vocal wetness. Response for place was unintelligible and accurate when given choice of 2.  It is hopeful pt's alertness will improve soon to initiate po's and/or have instrumentation.    HPI HPI: Donald [name] is a 65 y.o. male with a medical history significant for dementia with behavioral disturbance, OSA, arthritis  admitted 5/31 with acute parenchymal hemorrhage in the posterior right frontal lobe with small amount of adjacent subarachnoid hemorrhage. Seen by ST and progressed from puree/thin to Dys 2. Transferred to CIR and experienced increased stroke symptoms 6/12 morning. CT revealed progressed right frontal hematoma and vasogenic edema with midline shift now measuring 13 mm and transferred to acute. Per chart and wife history he had some short term memory deficits and evaluated and diagnosed with mild cognitive deficits at [facility].      SLP Plan  Continue with  current plan of care       Recommendations  Diet recommendations: NPO Medication Administration: Via alternative means                Oral Care Recommendations: Oral care QID Follow up Recommendations: Inpatient Rehab SLP Visit Diagnosis: Dysphagia, unspecified (R13.10);Cognitive communication deficit (H84.696) Plan: Continue with current plan of care                       [name] 05/31/2021, 2:27 PM  [name] [name].Ed Nurse, children's [phone] Office [phone]

## 2021-05-31 NOTE — Progress Notes (Signed)
Inpatient Rehab Admissions Coordinator:   Met with patient at bedside SLP present.  He is extremely lethargic this afternoon.  Per SLP late dose of keppra may be contributing, also ?suspicion for aspiration per chart review.   Will speak to wife regarding return to rehab and insurance process tomorrow.    Shann Medal, PT, DPT Admissions Coordinator [phone] 05/31/21  2:52 PM

## 2021-05-31 NOTE — Progress Notes (Signed)
Physical Therapy Treatment Patient Details Name: Donald [name] MRN: [id number] DOB: [birth year] Today's Date: 05/31/2021    History of Present Illness The pt is a 65 yo male re-admitted from CIR on 6/12 due to sudden onset of worsening mental status, seizures, and facial droop. Repeat CT revealed enlargement of original R frontal ICH with worsening midline shift. Significant PMH includes: memory deficits, OSA.Marland Kitchen    PT Comments    The pt was seen for continued progression of OOB mobility and strengthening, however, the pt presents with increased lethargy at this time (suspect due to medications). The pt was able to tolerate transition to sitting EOB, but required max/totalA of 2 for safety due to lethargy and decreased command following initially. The pt was able to tolerate multiple exercises targeting LE strength, seated balance, and use of UE while sitting EOB, and this upright position did improve alertness slightly. The pt was eager to attempt to stand, but despite pt attempts to assist with RUE and RLE, continued to require maxA of 2 with use of bed pad to clear hips. Pt will continue to benefit from skilled PT to progress strength, seated stability, and power to complete transfers with reduced assist.    Follow Up Recommendations  CIR     Equipment Recommendations  3in1 (PT);Wheelchair (measurements PT);Wheelchair cushion (measurements PT);Hospital bed    Recommendations for Other Services       Precautions / Restrictions Precautions Precautions: Fall;Other (comment) Precaution Comments: L hemiparesis, L inattention, pusher to the L, cognitive deficits, cortrak Restrictions Weight Bearing Restrictions: No    Mobility  Bed Mobility Overal bed mobility: Needs Assistance Bed Mobility: Supine to Sit;Sit to Supine Rolling: +2 for physical assistance;+2 for safety/equipment;Max assist Sidelying to sit: Total assist;+2 for physical assistance;+2 for safety/equipment   Sit to  supine: Total assist;+2 for safety/equipment   General bed mobility comments: modA with cues to initiate reaching with RUE. decreased L gaze/attention so max cues to initiate movement to L. totalA to complete movement due to decreased processing, weakness    Transfers Overall transfer level: Needs assistance Equipment used: 2 person hand held assist Transfers: Sit to/from [employer] Sit to Stand: +2 physical assistance;+2 safety/equipment;Max assist   Squat pivot transfers: Max assist;+2 physical assistance     General transfer comment: maxA as pt attempting to assist with RUE and some activation of RLE. maxA of 2 with use of pad to achieve hip lift and pivot laterally to move along HOB. pt with significant forward trunk flexion relyiung entirely on therapists for trunk support  Ambulation/Gait             General Gait Details: unable      Modified Rankin (Stroke Patients Only) Modified Rankin (Stroke Patients Only) Pre-Morbid Rankin Score: No symptoms Modified Rankin: Severe disability     Balance Overall balance assessment: Needs assistance Sitting-balance support: Feet supported;Single extremity supported Sitting balance-Leahy Scale: Poor Sitting balance - Comments: Pusher to the L Postural control: Left lateral lean;Posterior lean Standing balance support: Bilateral upper extremity supported Standing balance-Leahy Scale: Zero Standing balance comment: posterior and lean to L.                            Cognition Arousal/Alertness: Lethargic;Suspect due to medications Behavior During Therapy: Flat affect Overall Cognitive Status: Impaired/Different from baseline Area of Impairment: Following commands;Safety/judgement;Awareness;Problem solving  Current Attention Level: Focused   Following Commands: Follows one step commands inconsistently;Follows one step commands with increased time Safety/Judgement:  Decreased awareness of safety;Decreased awareness of deficits Awareness: Intellectual Problem Solving: Requires verbal cues;Requires tactile cues;Slow processing;Decreased initiation;Difficulty sequencing General Comments: pt with decreased verbalizations this session, mumbling at times but no discernible speech. folling commands with R extremity with increased time and assist at times due to weakness. not following commands for eye opening or holding head up, but pt was given seizure medicines prior to session which may be affecting alertness      Exercises General Exercises - Lower Extremity Long Arc Quad: AROM;Right;5 reps;Seated Other Exercises Other Exercises: lateral lean to LUE to encourage L cervical sidebend and rotation, support at shoulder to support hemiparetic LUE Other Exercises: lateral lean to RUE and return to midline Other Exercises: manual therapy to cervical muscles and R upper trap to improve PROM    General Comments General comments (skin integrity, edema, etc.): BP stable through session      Pertinent Vitals/Pain Pain Assessment: No/denies pain Faces Pain Scale: No hurt Pain Location: R neck with manual therapy only, mostly no pain or discomfort indicated Pain Descriptors / Indicators: Grimacing Pain Intervention(s): Monitored during session     PT Goals (current goals can now be found in the care plan section) Acute Rehab PT Goals Patient Stated Goal: spouse wants pt to return to CIR PT Goal Formulation: With patient/family Time For Goal Achievement: 06/12/21 Potential to Achieve Goals: Good Progress towards PT goals: Progressing toward goals    Frequency    Min 4X/week      PT Plan Current plan remains appropriate       AM-PAC PT "6 Clicks" Mobility   Outcome Measure  Help needed turning from your back to your side while in a flat bed without using bedrails?: Total Help needed moving from lying on your back to sitting on the side of a flat  bed without using bedrails?: Total Help needed moving to and from a bed to a chair (including a wheelchair)?: Total Help needed standing up from a chair using your arms (e.g., wheelchair or bedside chair)?: Total Help needed to walk in hospital room?: Total Help needed climbing 3-5 steps with a railing? : Total 6 Click Score: 6    End of Session Equipment Utilized During Treatment: Gait belt Activity Tolerance: Patient tolerated treatment well;Patient limited by fatigue Patient left: in bed;with call bell/phone within reach;with bed alarm set;with family/visitor present Nurse Communication: Mobility status PT Visit Diagnosis: Other abnormalities of gait and mobility (R26.89);Muscle weakness (generalized) (M62.81);Hemiplegia and hemiparesis Hemiplegia - Right/Left: Left Hemiplegia - dominant/non-dominant: Non-dominant Hemiplegia - caused by: Nontraumatic intracerebral hemorrhage     Time: 4010-2725 PT Time Calculation (min) (ACUTE ONLY): 33 min  Charges:  $Therapeutic Activity: 8-22 mins $Neuromuscular Re-education: 8-22 mins                     Rolm Baptise, PT, DPT   Acute Rehabilitation Department Pager #: [phone]   Gaetana Michaelis 05/31/2021, 2:23 PM

## 2021-05-31 NOTE — Progress Notes (Signed)
PROGRESS NOTE    Donald [name]  WUJ:[id number] DOB: 12-20-1955 DOA: 05/23/2021 PCP: [name], MD    Brief Narrative:  Donald [name] was admitted to the hospital with the working diagnosis of acute worsening intracranial hemorrhage of the right frontal lobe.    65 year old male with recent intracranial hemorrhage who presents from inpatient rehab due to altered mentation and worsening focal neurologic deficits.  Originally admitted to the hospital May 31 with intracranial hemorrhage of the posterior right frontal lobe.  He was transferred to inpatient rehab for further care.  While at the rehab he had acute change in his mentation with difficulty swallowing and facial droop.  Stat head CT showed right intraparenchymal hemorrhage with worsening midline shift.  He was transported to neuro ICU for further evaluation.  His blood pressure was 135/77, temperature 97.5, respiratory rate 18, oxygen saturation 100%, his lungs are clear to auscultation bilaterally, heart S1-S2, present, rhythmic, soft abdomen, no lower extremity edema.  Patient was responsive to noxious stimuli but not verbal or touch.  Not following commands.   Patient admitted to the neuro ICU, for further close neurologic monitoring.  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  Patient was placed on Keppra for seizure prophylaxis.  Medical treatment with hypertonic saline for intracranial hypertension.   He was found to have a right femoral DVT and an IVC filter was placed (06/16).  He was diagnosed with left lower lobe pneumonia (not present on admission) and received antibiotic therapy with ceftriaxone.   Assessment & Plan:   Principal Problem:   Intracerebral hemorrhage Active Problems:   Hypokalemia   Malnutrition of moderate degree   Seizure (HCC)   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HCC)   Right lower lobe pneumonia   Prediabetes   Hyperglycemia     Intracranial  hemorrhage right frontal lobe.  Continue to have left sided hemiparesis and swallow dysfunction, this am had to be suction due to copious secretions in the oropharynx. Chest film personally reviewed with no infiltrates.   Continue with keppra for seizure prophylaxis, no active seizures.   Case discussed with Neurology, Dr Pearlean Brownie, plan to transfer to CIR, if no signs of swallow recovery will need PEG tube.    2. Right femoral DVT. Not candidate for full anticoagulation. He had a IVC filter placed on 05/27/21.  Discussed with his family at the bedside, will plan to continue holding prophylactic anticoagulation for now. Plan to remove IVC filter when reduced bleeding risk and anticoagulation can be started. Family aware that after prolonged time filter can be source of further blood clots    3. Left lower lobe pneumonia. Patient has completed therapy with ceftriaxone.  Chest film today ruled out recurrent aspiration.  Continue oxymetry monitoring and aspiration precautions,  Frequent suctioning as needed.    4. Pre-diabetes Hgb A1c 5,8 complicated with hyperglycemia. Moderate calorie protein malnutrition. Fasting glucose today is 210. Capillary 168 and 100.  On insulin sliding scale for glucose cover and monitoring.   Tube feedings and aspiration precautions.    5. Hypokalemia/ hypernatremia/ hyperchloremia and contraction alkalosis. Serum Na is up to 152 with K at 4,4 and cl 112 with bicarbonate at 32.,  Plan to increase free water flushes and follow up renal function and electrolytes in am.   Patient continue to be at high risk for hypernatremia   Status is: Inpatient  Remains inpatient appropriate because:Inpatient level of care appropriate due to severity of illness  Dispo: The patient is from:  [facility]              Anticipated d/c is to: CIR              Patient currently is not medically stable to d/c.   Difficult to place patient No    DVT prophylaxis: IVC filter   Code  Status:   full  Family Communication:  I spoke with patient's wife (brother over the phone) at the bedside, we talked in detail about patient's condition, plan of care and prognosis and all questions were addressed.      Nutrition Status: Nutrition Problem: Moderate Malnutrition Etiology: chronic illness Signs/Symptoms: mild fat depletion, mild muscle depletion, moderate muscle depletion Interventions: Tube feeding     Skin Documentation:     Consultants:  Neurology     Subjective: Patient at the time of my examination is somnolent but reactive, moves right side but continue to have left sided hemiparesis.   Objective: Vitals:   05/31/21 0459 05/31/21 0500 05/31/21 0600 05/31/21 0700  BP:  139/83 (!) 153/90 139/87  Pulse:  86 96 89  Resp:  13 13 14   Temp:      TempSrc:      SpO2:  100% 97% 100%  Weight: 66.1 kg       Intake/Output Summary (Last 24 hours) at 05/31/2021 1632 Last data filed at 05/31/2021 0600 Gross per 24 hour  Intake 1300 ml  Output 1100 ml  Net 200 ml   Filed Weights   05/28/21 0440 05/29/21 0400 05/31/21 0459  Weight: 63.4 kg 65.6 kg 66.1 kg    Examination:   General: deconditioned  Neurology: somnolent. Arouses to pain stimuli, withdraws with right upper extremity and moves right leg but not against gravity. Speech is mumbling and not frank communicative.  E ENT: no pallor, no icterus, oral mucosa moist. NG tube in place.  Cardiovascular: No JVD. S1-S2 present, rhythmic, no gallops, rubs, or murmurs. No lower extremity edema. Pulmonary: positive breath sounds bilaterally, adequate air movement, no wheezing, rhonchi or rales. Positive rhonchi at the upper airway.  Gastrointestinal. Abdomen soft and non tender Skin. No rashes Musculoskeletal: no joint deformities     Data Reviewed: I have personally reviewed following labs and imaging studies  CBC: Recent Labs  Lab 05/26/21 0335 05/27/21 0300 05/28/21 0450 05/29/21 0617  05/31/21 0449  WBC 24.8* 23.9* 17.7* 12.9* 12.8*  NEUTROABS  --   --   --   --  10.0*  HGB 14.0 14.3 15.6 13.5 12.9*  HCT 43.9 44.6 49.7 43.8 41.9  MCV 94.4 93.3 95.4 95.4 96.8  PLT 360 425* 492* 434* 376   Basic Metabolic Panel: Recent Labs  Lab 05/25/21 0141 05/25/21 0743 05/25/21 1429 05/25/21 2306 05/26/21 0335 05/26/21 0912 05/27/21 0300 05/27/21 1637 05/28/21 0450 05/28/21 1636 05/29/21 0617 05/29/21 1655 05/30/21 0543 05/31/21 0449  NA 144  145   < > 154*   < > 158*   < > 156*   < > 157* 158* 158* 160* 156* 152*  K 4.4  --   --   --  4.0  --  4.2  --  3.8  --  3.6  --   --  4.4  CL 115*  --   --   --  121*  --  119*  --  114*  --  124*  --   --  112*  CO2 23  --   --   --  28  --  30  --  31  --  27  --   --  32  GLUCOSE 200*  --   --   --  168*  --  93  --  134*  --  250*  --   --  210*  BUN 29*  --   --   --  23  --  25*  --  26*  --  25*  --   --  20  CREATININE 1.03  --   --   --  1.02  --  0.98  --  1.09  --  0.98  --   --  1.09  CALCIUM 9.2  --   --   --  9.7  --  9.8  --  10.4*  --  9.4  --   --  9.1  MG 2.6*  --  2.5*  --   --   --   --   --   --   --   --   --   --   --   PHOS 2.1*  --  1.9*  --   --   --   --   --   --   --   --   --   --   --    < > = values in this interval not displayed.   GFR: Estimated Creatinine Clearance: 59.6 mL/min (by C-G formula based on SCr of 1.09 mg/dL). Liver Function Tests: No results for input(s): AST, ALT, ALKPHOS, BILITOT, PROT, ALBUMIN in the last 168 hours. No results for input(s): LIPASE, AMYLASE in the last 168 hours. No results for input(s): AMMONIA in the last 168 hours. Coagulation Profile: No results for input(s): INR, PROTIME in the last 168 hours. Cardiac Enzymes: No results for input(s): CKTOTAL, CKMB, CKMBINDEX, TROPONINI in the last 168 hours. BNP (last 3 results) No results for input(s): PROBNP in the last 8760 hours. HbA1C: No results for input(s): HGBA1C in the last 72 hours. CBG: Recent Labs   Lab 05/30/21 1948 05/30/21 2323 05/31/21 0320 05/31/21 0818 05/31/21 1155  GLUCAP 154* 142* 171* 168* 100*   Lipid Profile: No results for input(s): CHOL, HDL, LDLCALC, TRIG, CHOLHDL, LDLDIRECT in the last 72 hours. Thyroid Function Tests: No results for input(s): TSH, T4TOTAL, FREET4, T3FREE, THYROIDAB in the last 72 hours. Anemia Panel: No results for input(s): VITAMINB12, FOLATE, FERRITIN, TIBC, IRON, RETICCTPCT in the last 72 hours.    Radiology Studies: I have reviewed all of the imaging during this hospital visit personally     Scheduled Meds:  chlorhexidine  15 mL Mouth Rinse BID   Chlorhexidine Gluconate Cloth  6 each Topical Daily   enoxaparin (LOVENOX) injection  40 mg Subcutaneous Q24H   feeding supplement (PROSource TF)  45 mL Per Tube Daily   free water  300 mL Per Tube Q6H   insulin aspart  0-9 Units Subcutaneous Q4H   levETIRAcetam  1,000 mg Per Tube BID   mouth rinse  15 mL Mouth Rinse q12n4p   Continuous Infusions:  feeding supplement (OSMOLITE 1.5 CAL) 1,000 mL (05/31/21 1441)     LOS: 8 days        Timya Trimmer Annett Gula, MD

## 2021-05-31 NOTE — Progress Notes (Signed)
STROKE TEAM PROGRESS NOTE   SUBJECTIVE (INTERVAL HISTORY) His family member and Dr. Ella Jubilee are at the bedside. Pt neuro stable, no acute event overnight.    Patient is sleepy but can be aroused and appears and interactive.  He is afebrile.  Vital signs are stable.  Neurological exam is unchanged.  Blood pressure adequately controlled.   Continues to have dysphagia and has tube feeding.  Therapist recommend inpatient rehab OBJECTIVE Temp:  [99 F (37.2 C)-99.4 F (37.4 C)] 99.2 F (37.3 C) (06/20 0400) Pulse Rate:  [86-96] 89 (06/20 0700) Cardiac Rhythm: Normal sinus rhythm (06/20 0901) Resp:  [13-16] 14 (06/20 0700) BP: (124-153)/(79-92) 139/87 (06/20 0700) SpO2:  [97 %-100 %] 100 % (06/20 0700) Weight:  [66.1 kg] 66.1 kg (06/20 0459)  Recent Labs  Lab 05/30/21 1948 05/30/21 2323 05/31/21 0320 05/31/21 0818 05/31/21 1155  GLUCAP 154* 142* 171* 168* 100*   Recent Labs  Lab 05/25/21 0141 05/25/21 0743 05/25/21 1429 05/25/21 2306 05/26/21 0335 05/26/21 0912 05/27/21 0300 05/27/21 1637 05/28/21 0450 05/28/21 1636 05/29/21 0617 05/29/21 1655 05/30/21 0543 05/31/21 0449  NA 144  145   < > 154*   < > 158*   < > 156*   < > 157* 158* 158* 160* 156* 152*  K 4.4  --   --   --  4.0  --  4.2  --  3.8  --  3.6  --   --  4.4  CL 115*  --   --   --  121*  --  119*  --  114*  --  124*  --   --  112*  CO2 23  --   --   --  28  --  30  --  31  --  27  --   --  32  GLUCOSE 200*  --   --   --  168*  --  93  --  134*  --  250*  --   --  210*  BUN 29*  --   --   --  23  --  25*  --  26*  --  25*  --   --  20  CREATININE 1.03  --   --   --  1.02  --  0.98  --  1.09  --  0.98  --   --  1.09  CALCIUM 9.2  --   --   --  9.7  --  9.8  --  10.4*  --  9.4  --   --  9.1  MG 2.6*  --  2.5*  --   --   --   --   --   --   --   --   --   --   --   PHOS 2.1*  --  1.9*  --   --   --   --   --   --   --   --   --   --   --    < > = values in this interval not displayed.   No results for input(s):  AST, ALT, ALKPHOS, BILITOT, PROT, ALBUMIN in the last 168 hours.  Recent Labs  Lab 05/26/21 0335 05/27/21 0300 05/28/21 0450 05/29/21 0617 05/31/21 0449  WBC 24.8* 23.9* 17.7* 12.9* 12.8*  NEUTROABS  --   --   --   --  10.0*  HGB 14.0 14.3 15.6 13.5 12.9*  HCT 43.9 44.6 49.7  43.8 41.9  MCV 94.4 93.3 95.4 95.4 96.8  PLT 360 425* 492* 434* 376   No results for input(s): CKTOTAL, CKMB, CKMBINDEX, TROPONINI in the last 168 hours. No results for input(s): LABPROT, INR in the last 72 hours. No results for input(s): COLORURINE, LABSPEC, PHURINE, GLUCOSEU, HGBUR, BILIRUBINUR, KETONESUR, PROTEINUR, UROBILINOGEN, NITRITE, LEUKOCYTESUR in the last 72 hours.  Invalid input(s): APPERANCEUR      Component Value Date/Time   CHOL 232 (H) 05/11/2021 1118   CHOL 204 (H) 09/11/2020 1449   TRIG 73 05/11/2021 1118   HDL 77 05/11/2021 1118   HDL 63 09/11/2020 1449   CHOLHDL 3.0 05/11/2021 1118   VLDL 15 05/11/2021 1118   LDLCALC 140 (H) 05/11/2021 1118   LDLCALC 132 (H) 09/11/2020 1449   LDLCALC 107 (H) 04/29/2020 0826   Lab Results  Component Value Date   HGBA1C 5.8 (H) 05/11/2021      Component Value Date/Time   LABOPIA NONE DETECTED 05/11/2021 1109   COCAINSCRNUR NONE DETECTED 05/11/2021 1109   LABBENZ NONE DETECTED 05/11/2021 1109   AMPHETMU NONE DETECTED 05/11/2021 1109   THCU NONE DETECTED 05/11/2021 1109   LABBARB NONE DETECTED 05/11/2021 1109    No results for input(s): ETH in the last 168 hours.   I have personally reviewed the radiological images below and agree with the radiology interpretations.  DG Abd 1 View  Result Date: 05/20/2021 CLINICAL DATA:  Abdominal pain. EXAM: ABDOMEN - 1 VIEW COMPARISON:  None. FINDINGS: Large amount of intestinal gas throughout small and large bowel suggesting ileus. There does not appear to be a large amount of fecal matter. No focal obstruction is evident. No free air. No abnormal calcifications or significant bone findings. IMPRESSION:  Large amount of intestinal gas suggesting ileus. Electronically Signed   By: Paulina Fusi M.D.   On: 05/20/2021 19:25   CT HEAD WO CONTRAST  Result Date: 05/27/2021 CLINICAL DATA:  65 year old male with right hemisphere hemorrhage at code stroke presentation 05/11/2021. Subsequent encounter. EXAM: CT HEAD WITHOUT CONTRAST TECHNIQUE: Contiguous axial images were obtained from the base of the skull through the vertex without intravenous contrast. COMPARISON:  Head CT 05/23/2021 and earlier. FINDINGS: Brain: Evolving right superior frontal lobe hemorrhage. Slowly fading hyperdense and mixed density blood products involving the superior and middle frontal gyri in an area of up to 48 x 35 x 50 mm (AP by transverse by CC). Continued regional edema and mass effect including effaced right lateral ventricle and leftward midline shift up to 7 mm at the midportion of the septum pellucidum. No ventriculomegaly. No intraventricular or significant extra-axial hemorrhage. Basilar cisterns remain patent. Stable gray-white matter differentiation elsewhere. No acute cortically based infarct. Vascular: Mild Calcified atherosclerosis at the skull base. Skull: Stable, negative. Sinuses/Orbits: Mild to moderate maxillary alveolar recess mucosal thickening. Otherwise paranasal sinuses and mastoids are stable and well aerated. Other: Right nasoenteric tube in place. Visualized orbits and scalp soft tissues are within normal limits. IMPRESSION: 1. Slowly evolving right superior frontal lobe hemorrhage, not significantly changed in size or configuration since 05/23/2021, with stable regional edema and mass effect. 2. No new intracranial abnormality. Electronically Signed   By: Odessa Fleming M.D.   On: 05/27/2021 06:24   CT HEAD WO CONTRAST  Result Date: 05/23/2021 CLINICAL DATA:  Follow-up intracerebral hemorrhage EXAM: CT HEAD WITHOUT CONTRAST TECHNIQUE: Contiguous axial images were obtained from the base of the skull through the vertex  without intravenous contrast. COMPARISON:  CT 05/23/2021 FINDINGS: Brain: No significant interval  change in the overall size of the intraparenchymal hemorrhagic component seen in the right frontal with some extensive surrounding hypo attenuating edematous changes. Locoregional mass effect with sulcal effacement and effacement of the right lateral ventricle as well as stable subfalcine herniation with a right to left midline shift of approximately 14 mm. Slight medialization of the right uncus without clear transtentorial herniation is also unchanged from prior. Basal cisterns remain patent. No new sites of hemorrhage. No new loss of gray-white differentiation. Vascular: No hyperdense vessel or unexpected calcification. Skull: Normal. Negative for fracture or focal lesion. EEG leads in place. Sinuses/Orbits: Mild mural thickening in the maxillary sinuses. Paranasal sinuses and mastoid air cells are otherwise predominantly clear. Included orbital structures are unremarkable. Other: None. IMPRESSION: 1. Stable appearance of the right frontal lobe intraparenchymal hemorrhage with surrounding cerebral edema, local mass effect as well as subfalcine midline shift of approximately 14 mm in slight medial ization of the right uncus, not significantly changed from comparison imaging. 2. No new sites of hemorrhage or new gray-white differentiation loss. Electronically Signed   By: Kreg Shropshire M.D.   On: 05/23/2021 23:58   CT HEAD WO CONTRAST  Result Date: 05/23/2021 CLINICAL DATA:  Follow-up right frontal parenchymal hemorrhage seen earlier today. EXAM: CT HEAD WITHOUT CONTRAST TECHNIQUE: Contiguous axial images were obtained from the base of the skull through the vertex without intravenous contrast. COMPARISON:  Earlier today. FINDINGS: Brain: No significant change in the previously described high right frontal lobe parenchymal hematoma with extensive surrounding edema and midline shift to the left. The midline shift  currently measures 10 mm on image number 16/3. No interval ventricular enlargement. No new hemorrhage. Vascular: No hyperdense vessel or unexpected calcification. Skull: Normal. Negative for fracture or focal lesion. Sinuses/Orbits: Stable small left frontal sinus osteoma. Mild moderate left maxillary sinus mucosal thickening without significant change. Unremarkable orbits. Other: Bilateral concha bullosa. IMPRESSION: 1. Stable large high right frontal parenchymal hematoma with extensive associated edema and midline shift to the left. 2. Stable left maxillary chronic sinusitis. Electronically Signed   By: Beckie Salts M.D.   On: 05/23/2021 18:00   CT HEAD WO CONTRAST  Result Date: 05/23/2021 CLINICAL DATA:  Cerebral hemorrhage, decreased responsiveness EXAM: CT HEAD WITHOUT CONTRAST TECHNIQUE: Contiguous axial images were obtained from the base of the skull through the vertex without intravenous contrast. COMPARISON:  Earlier same day FINDINGS: Brain: Parenchymal hematoma centered within the high right frontal lobe does not measure substantially changed from the prior study. Extensive surrounding edema is again noted with regional mass effect. Leftward midline shift measures similar at 14 mm at the level of the septum pellucidum. Ventricle caliber is unchanged with no new trapping. No significant central herniation. There is no new loss of gray-white differentiation. Intraventricular hemorrhage is identified. Vascular: No new findings. Skull: Unremarkable. Sinuses/Orbits: No acute finding. Other: None. IMPRESSION: No substantial change in right frontal parenchymal hemorrhage, edema, and mass effect including midline shift. No new ventricle trapping or acute infarction. Electronically Signed   By: Guadlupe Spanish M.D.   On: 05/23/2021 12:49   CT HEAD WO CONTRAST  Result Date: 05/11/2021 CLINICAL DATA:  Stroke.  Intracranial hemorrhage follow-up. EXAM: CT HEAD WITHOUT CONTRAST TECHNIQUE: Contiguous axial images  were obtained from the base of the skull through the vertex without intravenous contrast. COMPARISON:  Same day head CT. FINDINGS: Brain: Mild interval increase in the size of the right frontal intraparenchymal hemorrhage, measuring 4.5 x 2.6 x 3.5 cm (previously 4.2 x 2.3 x  3.1 cm). Similar adjacent small volume subarachnoid hemorrhage. A small amount of subdural hemorrhage in this region is also not excluded. Surrounding edema is similar. Similar local mass effect without midline shift. Basal cisterns are patent. No evidence of acute large vascular territory infarct elsewhere. Similar mild white matter hypodensities, likely chronic microvascular ischemic disease. No hydrocephalus. Vascular: No hyperdense vessel.  Calcific atherosclerosis. Skull: No acute fracture. Sinuses/Orbits: Small left frontal sinus osteoma. Mild right and moderate left maxillary sinus mucosal thickening. Unremarkable orbits. Other: No mastoid effusions. IMPRESSION: Mild interval increase in the size of the right frontal intraparenchymal hemorrhage, measuring 4.5 x 2.6 x 3.5 cm (previously 4.2 x 2.3 x 3.1 cm). Similar adjacent small volume hemorrhage. Electronically Signed   By: Feliberto Harts MD   On: 05/11/2021 17:05   MR BRAIN WO CONTRAST  Result Date: 05/12/2021 CLINICAL DATA:  65 year old male code stroke presentation with intra-axial right hemisphere hemorrhage. Hypertensive (194/104) on presentation. EXAM: MRI HEAD WITHOUT CONTRAST TECHNIQUE: Multiplanar, multiecho pulse sequences of the brain and surrounding structures were obtained without intravenous contrast. COMPARISON:  CT head CTA head and neck 05/11/2021 FINDINGS: Brain: Coronal T2 weighted imaging could not be obtained. And some of the exam is intermittently degraded by motion artifact despite repeated imaging attempts. T1 isointense intra-axial hemorrhage in the posterior right frontal lobe demonstrates a layering hematocrit level (series 13, image 19) and blood  encompasses 43 by 38 x 33 mm (AP by transverse by CC) for an estimated volume of 26 mL. Regional edema. Mild regional mass effect. Trace subarachnoid hemorrhage also suspected on axial FLAIR imaging. Additionally, on SWI there is also chronic hemosiderin in the right parietal lobe on series 19 image 34. But no other definite chronic blood products. Susceptibility related abnormal diffusion in the posterior right frontal lobe with no convincing larger area of restricted diffusion. No restricted diffusion elsewhere. No IVH or ventriculomegaly. Normal basilar cisterns. Negative pituitary and cervicomedullary junction. Wallace Cullens and white matter signal outside of the affected right hemisphere is largely normal for age with mild nonspecific white matter changes. Vascular: Major intracranial vascular flow voids are preserved. Skull and upper cervical spine: Visualized bone marrow signal is within normal limits. Grossly negative cervical spine. Sinuses/Orbits: Negative orbits. Mild to moderate maxillary sinus mucosal thickening. Other: Mastoids are clear. Grossly normal visible internal auditory structures. IMPRESSION: 1. Posterior right frontal lobe intra-axial hemorrhage with layering hematocrit level has not significantly changed (estimated blood volume of 26 mL). Surrounding edema and mild regional mass effect. Trace subarachnoid hemorrhage. 2. No larger underlying infarct is evident. But chronic hemosiderin in the right parietal lobe indicates a previous intra-axial hemorrhage in the region. 3. No other acute intracranial abnormality. Electronically Signed   By: Odessa Fleming M.D.   On: 05/12/2021 07:26   IR IVC FILTER PLMT / S&I Lenise Arena GUID/MOD SED  Result Date: 05/27/2021 INDICATION: 65 year old gentleman with lower extremity DVT and acute intracranial hemorrhage presents to IR for IVC filter placement EXAM: Retrievable IVC filter placement utilizing fluoroscopic and ultrasound guidance MEDICATIONS: None.  ANESTHESIA/SEDATION: One mg IV Versed; 50 mcg IV Fentanyl Moderate Sedation Time:  12 minutes The patient was continuously monitored during the procedure by the interventional radiology nurse under my direct supervision. FLUOROSCOPY TIME:  Fluoroscopy Time: 1 minutes 6 seconds (9 mGy). COMPLICATIONS: None immediate. PROCEDURE: Informed written consent was obtained from the patient after a thorough discussion of the procedural risks, benefits and alternatives. All questions were addressed. Maximal Sterile Barrier Technique was utilized including caps, mask, sterile  gowns, sterile gloves, sterile drape, hand hygiene and skin antiseptic. A timeout was performed prior to the initiation of the procedure. Patient positioned supine on the angiography table. Right neck prepped and draped in usual sterile fashion. All elements of maximal sterile barrier were utilized including, cap, mask, sterile gown, sterile gloves, large sterile drape, hand scrubbing and 2% Chlorhexidine for skin cleaning. The right internal jugular vein was evaluated with ultrasound and shown to be patent. A permanent ultrasound image was obtained and placed in the patient's medical record. Using sterile gel and a sterile probe cover, the right internal jugular vein was entered with a 21 ga needle during real time ultrasound guidance. 21 gauge needle exchanged for a transitional dilator set over 0.018 inch guidewire. Transitional dilator set exchanged for 10 fr sheath over 0.035 inch guidewire. Sheath placed to the infrarenal IVC and CO2 venogram performed to delineate the position of the renal veins. Retrievable Denali IVC filter deployed in the infrarenal location. Sheath removed and hemostasis achieved with manual compression. IMPRESSION: Retrievable Denali IVC filter placed. PLAN: This IVC filter is potentially retrievable. The patient will be assessed for filter retrieval by Interventional Radiology in approximately 8-12 weeks. Further  recommendations regarding filter retrieval, continued surveillance or declaration of device permanence, will be made at that time. Electronically Signed   By: Acquanetta Belling M.D.   On: 05/27/2021 16:38   IR US Guide Vasc Access Right  Result Date: 05/27/2021 INDICATION: 65 year old gentleman with lower extremity DVT and acute intracranial hemorrhage presents to IR for IVC filter placement EXAM: Retrievable IVC filter placement utilizing fluoroscopic and ultrasound guidance MEDICATIONS: None. ANESTHESIA/SEDATION: One mg IV Versed; 50 mcg IV Fentanyl Moderate Sedation Time:  12 minutes The patient was continuously monitored during the procedure by the interventional radiology nurse under my direct supervision. FLUOROSCOPY TIME:  Fluoroscopy Time: 1 minutes 6 seconds (9 mGy). COMPLICATIONS: None immediate. PROCEDURE: Informed written consent was obtained from the patient after a thorough discussion of the procedural risks, benefits and alternatives. All questions were addressed. Maximal Sterile Barrier Technique was utilized including caps, mask, sterile gowns, sterile gloves, sterile drape, hand hygiene and skin antiseptic. A timeout was performed prior to the initiation of the procedure. Patient positioned supine on the angiography table. Right neck prepped and draped in usual sterile fashion. All elements of maximal sterile barrier were utilized including, cap, mask, sterile gown, sterile gloves, large sterile drape, hand scrubbing and 2% Chlorhexidine for skin cleaning. The right internal jugular vein was evaluated with ultrasound and shown to be patent. A permanent ultrasound image was obtained and placed in the patient's medical record. Using sterile gel and a sterile probe cover, the right internal jugular vein was entered with a 21 ga needle during real time ultrasound guidance. 21 gauge needle exchanged for a transitional dilator set over 0.018 inch guidewire. Transitional dilator set exchanged for 10 fr  sheath over 0.035 inch guidewire. Sheath placed to the infrarenal IVC and CO2 venogram performed to delineate the position of the renal veins. Retrievable Denali IVC filter deployed in the infrarenal location. Sheath removed and hemostasis achieved with manual compression. IMPRESSION: Retrievable Denali IVC filter placed. PLAN: This IVC filter is potentially retrievable. The patient will be assessed for filter retrieval by Interventional Radiology in approximately 8-12 weeks. Further recommendations regarding filter retrieval, continued surveillance or declaration of device permanence, will be made at that time. Electronically Signed   By: Acquanetta Belling M.D.   On: 05/27/2021 16:38   DG Chest Port 1  View  Result Date: 05/31/2021 CLINICAL DATA:  Dyspnea EXAM: PORTABLE CHEST 1 VIEW COMPARISON:  Portable exam 0912 hours compared to 05/24/2021 FINDINGS: Feeding tube traverses esophagus and visualized stomach. Normal heart size, mediastinal contours, and pulmonary vascularity. Lungs clear. No pulmonary infiltrate, pleural effusion, or pneumothorax. Osseous structures unremarkable. IMPRESSION: No acute abnormalities. Electronically Signed   By: Ulyses Southward M.D.   On: 05/31/2021 10:54   DG CHEST PORT 1 VIEW  Result Date: 05/24/2021 CLINICAL DATA:  Leukocytosis EXAM: PORTABLE CHEST 1 VIEW COMPARISON:  04/28/15 FINDINGS: Cardiac shadow is within normal limits. Mild aortic calcifications are noted. The lungs are well aerated bilaterally with minimal left basilar atelectasis. No bony abnormality is seen. IMPRESSION: Minimal left basilar atelectasis. Electronically Signed   By: Alcide Clever M.D.   On: 05/24/2021 10:52   DG Abd Portable 1V  Result Date: 05/24/2021 CLINICAL DATA:  Feeding tube placement EXAM: PORTABLE ABDOMEN - 1 VIEW COMPARISON:  05/20/2021 FINDINGS: Limited radiograph of the lower chest and upper abdomen was obtained for the purposes of enteric tube localization. Enteric tube is seen coursing below the  diaphragm with distal tip terminating within the expected location of the gastric body. Air-filled large and small bowel loops throughout the abdomen, similar to prior. IMPRESSION: Enteric tube tip terminates within the gastric body. Electronically Signed   By: Duanne Guess D.O.   On: 05/24/2021 13:50   EEG adult  Result Date: 05/23/2021 Charlsie Quest, MD     05/23/2021  4:11 PM Patient Name: Donald [name] MRN: [id number] Epilepsy Attending: Charlsie Quest Referring Physician/Provider: Dr Bing Neighbors Date: 05/23/2021 Duration: 25.36 mins Patient history:  65 y.o. male currently admitted for ICH on 05/11/21 developed somnolence and aphasia with R gaze deviation that resolved with ativan. EEG to evaluate for seizure Level of alertness: asleep AEDs during EEG study: LEV Technical aspects: This EEG study was done with scalp electrodes positioned according to the 10-20 International system of electrode placement. Electrical activity was acquired at a sampling rate of 500Hz  and reviewed with a high frequency filter of 70Hz  and a low frequency filter of 1Hz . EEG data were recorded continuously and digitally stored. Description: No posterior dominant rhythm was seen. Sleep was characterized by sleep spindles (12 to 14 Hz), maximal frontocentral region. EEG showed continuous generalized 3 to 6 Hz theta-delta slowing. There is an excessive amount of 15 to 18 Hz, 2-3 uV beta activity distributed symmetrically and diffusely.  Hyperventilation and photic stimulation were not performed.   ABNORMALITY - Continuous slow, generalized - Excessive beta, generalized IMPRESSION: This study is suggestive of moderate to severe diffuse encephalopathy, nonspecific etiology. The excessive beta activity seen in the background is most likely due to the effect of benzodiazepine and is a benign EEG pattern. No seizures or epileptiform discharges were seen throughout the recording. Priyanka Annabelle Harman   Overnight EEG with  video  Result Date: 05/24/2021 Charlsie Quest, MD     05/25/2021  9:42 AM Patient Name: Donald [name] MRN: [id number] Epilepsy Attending: Charlsie Quest Referring Physician/Provider: Dr Bing Neighbors Duration: 05/23/2021 1655 to 05/24/2021 1655  Patient history:  65 y.o. male currently admitted for ICH on 05/11/21 developed somnolence and aphasia with R gaze deviation that resolved with ativan. EEG to evaluate for seizure  Level of alertness: awake, asleep  AEDs during EEG study: LEV  Technical aspects: This EEG study was done with scalp electrodes positioned according to the 10-20 International system of electrode placement. Electrical activity was acquired  at a sampling rate of  and reviewed with a high frequency filter of  and a low frequency filter of . EEG data were recorded continuously and digitally stored.  Description: No posterior dominant rhythm was seen. Sleep was characterized by sleep spindles (12 to 14 Hz), asymmetry ( R<L) maximal frontocentral region. EEG showed continuous generalized and lateralized right hemisphere 3 to 6 Hz theta-delta slowing. Hyperventilation and photic stimulation were not performed.    ABNORMALITY - Continuous slow, generalized and lateralized right hemisphere - Spindle asymmetry, right<left  IMPRESSION: This study is suggestive of cortical dysfunction in right hemisphere likely secondary to underlying structural abnormality.  Additionally there is moderate to severe diffuse encephalopathy, nonspecific etiology. No seizures or definite epileptiform discharges were seen throughout the recording.   Charlsie Quest   MR MRV HEAD W WO CONTRAST  Result Date: 05/30/2021 CLINICAL DATA:  Right parenchymal hemorrhage.  Abnormal arteriogram. EXAM: MR VENOGRAM HEAD WITHOUT AND WITH CONTRAST TECHNIQUE: Angiographic images of the intracranial venous structures were acquired using MRV technique without and with intravenous contrast. CONTRAST:  6.25mL GADAVIST  GADOBUTROL 1 MMOL/ML IV SOLN COMPARISON:  Cerebral arteriogram 05/28/2021. MR venogram 05/12/2021. FINDINGS: Stable appearance of dominant left transverse sinus. Postcontrast images demonstrate no thrombus. Straight sinus and deep cerebral veins are intact. Cortical veins are unremarkable. Superior sagittal sinus normal. Sagittal sinus and intra on the jugular veins are normal bilaterally. IMPRESSION: Normal variant MR venogram without significant change. No sinus thrombosis. Electronically Signed   By: Marin Roberts M.D.   On: 05/30/2021 12:49   MR MRV HEAD W WO CONTRAST  Result Date: 05/12/2021 CLINICAL DATA:  Intracranial hemorrhage. EXAM: MR VENOGRAM HEAD WITHOUT AND WITH CONTRAST TECHNIQUE: Angiographic images of the intracranial venous structures were acquired using MRV technique without and with intravenous contrast. CONTRAST:  7mL GADAVIST GADOBUTROL 1 MMOL/ML IV SOLN COMPARISON:  Brain MRI performed earlier today 05/12/2021. Noncontrast head CT examinations 05/11/2021. CT angiogram head/neck 05/11/2021. FINDINGS: The superior sagittal sinus, internal cerebral veins, vein of Galen, straight sinus, transverse sinuses, sigmoid sinuses and visualized jugular veins are patent. No appreciable intracranial venous thrombosis. Hypoplastic right transverse and sigmoid dural venous sinuses. An acute parenchymal hemorrhage within the posterior right frontal lobe is grossly unchanged in size as compared to the brain MRI performed earlier today. There is mild curvilinear vascular enhancement overlying the site of hemorrhage, which may be reactive. Elsewhere, no abnormal intracranial enhancement is identified. IMPRESSION: No evidence of intracranial venous thrombosis. Non dominant right transverse and sigmoid dural venous sinuses. Electronically Signed   By: Jackey Loge DO   On: 05/12/2021 11:27   ECHOCARDIOGRAM COMPLETE  Result Date: 05/11/2021    ECHOCARDIOGRAM REPORT   Patient Name:   [facility]  Date of Exam: 05/11/2021 Medical Rec #:  [id number]        Height:       68.0 in Accession #:    [id number]       Weight:       155.2 lb Date of Birth:  15-May-1956         BSA:          1.835 m Patient Age:    65 years         BP:           115/77 mmHg Patient Gender: M                HR:           84 bpm. Exam  Location:  Inpatient Procedure: 2D Echo, Cardiac Doppler and Color Doppler Indications:    Stroke I63.9  History:        Patient has no prior history of Echocardiogram examinations.  Sonographer:    Roosvelt Maser RDCS Referring Phys: [id number] Reyne Dumas [facility] IMPRESSIONS  1. Left ventricular ejection fraction, by estimation, is 65 to 70%. The left ventricle has normal function. The left ventricle has no regional wall motion abnormalities. Left ventricular diastolic parameters were normal.  2. Right ventricular systolic function is normal. The right ventricular size is normal. Tricuspid regurgitation signal is inadequate for assessing PA pressure.  3. The mitral valve is normal in structure. No evidence of mitral valve regurgitation.  4. The aortic valve was not well visualized. Aortic valve regurgitation is not visualized. No aortic stenosis is present.  5. The inferior vena cava is normal in size with greater than 50% respiratory variability, suggesting right atrial pressure of 3 mmHg. FINDINGS  Left Ventricle: Left ventricular ejection fraction, by estimation, is 65 to 70%. The left ventricle has normal function. The left ventricle has no regional wall motion abnormalities. The left ventricular internal cavity size was normal in size. There is  no left ventricular hypertrophy. Left ventricular diastolic parameters were normal. Right Ventricle: The right ventricular size is normal. No increase in right ventricular wall thickness. Right ventricular systolic function is normal. Tricuspid regurgitation signal is inadequate for assessing PA pressure. Left Atrium: Left atrial size was normal in size. Right Atrium:  Right atrial size was normal in size. Pericardium: There is no evidence of pericardial effusion. Mitral Valve: The mitral valve is normal in structure. No evidence of mitral valve regurgitation. Tricuspid Valve: The tricuspid valve is normal in structure. Tricuspid valve regurgitation is not demonstrated. Aortic Valve: The aortic valve was not well visualized. Aortic valve regurgitation is not visualized. No aortic stenosis is present. Aortic valve mean gradient measures 3.0 mmHg. Aortic valve peak gradient measures 6.7 mmHg. Aortic valve area, by VTI measures 2.77 cm. Pulmonic Valve: The pulmonic valve was not well visualized. Pulmonic valve regurgitation is not visualized. Aorta: The aortic root and ascending aorta are structurally normal, with no evidence of dilitation. Venous: The inferior vena cava is normal in size with greater than 50% respiratory variability, suggesting right atrial pressure of 3 mmHg. IAS/Shunts: The interatrial septum was not well visualized.  LEFT VENTRICLE PLAX 2D LVIDd:         4.90 cm  Diastology LVIDs:         2.80 cm  LV e' medial:    8.38 cm/s LV PW:         0.80 cm  LV E/e' medial:  9.0 LV IVS:        0.90 cm  LV e' lateral:   9.14 cm/s LVOT diam:     1.90 cm  LV E/e' lateral: 8.2 LV SV:         69 LV SV Index:   37 LVOT Area:     2.84 cm  RIGHT VENTRICLE RV Basal diam:  2.90 cm LEFT ATRIUM           Index       RIGHT ATRIUM           Index LA diam:      3.10 cm 1.69 cm/m  RA Area:     11.20 cm LA Vol (A2C): 26.8 ml 14.61 ml/m RA Volume:   21.90 ml  11.94 ml/m LA Vol (A4C): 39.2 ml 21.36  ml/m  AORTIC VALVE AV Area (Vmax):    2.64 cm AV Area (Vmean):   2.59 cm AV Area (VTI):     2.77 cm AV Vmax:           129.00 cm/s AV Vmean:          86.700 cm/s AV VTI:            0.248 m AV Peak Grad:      6.7 mmHg AV Mean Grad:      3.0 mmHg LVOT Vmax:         120.00 cm/s LVOT Vmean:        79.200 cm/s LVOT VTI:          0.242 m LVOT/AV VTI ratio: 0.98  AORTA Ao Root diam: 2.50 cm Ao  Asc diam:  3.30 cm MITRAL VALVE MV Area (PHT): 3.60 cm     SHUNTS MV Decel Time: 211 msec     Systemic VTI:  0.24 m MV E velocity: 75.30 cm/s   Systemic Diam: 1.90 cm MV A velocity: 107.00 cm/s MV E/A ratio:  0.70 Epifanio Lesches MD Electronically signed by Epifanio Lesches MD Signature Date/Time: 05/11/2021/5:28:15 PM    Final    CT HEAD CODE STROKE WO CONTRAST  Result Date: 05/23/2021 CLINICAL DATA:  Code stroke.  Acute stroke. EXAM: CT HEAD WITHOUT CONTRAST TECHNIQUE: Contiguous axial images were obtained from the base of the skull through the vertex without intravenous contrast. COMPARISON:  Head CT from 05/11/2021 FINDINGS: Brain: 5.5 x 2.8 cm hematoma in the subcortical high right frontal region with extensive vasogenic edema in the adjacent brain that is progressed. There is worsening local mass effect with midline shift now measuring 13 mm. No entrapment. No cytotoxic edema seen. Vascular: No hyperdense vessel or unexpected calcification. Skull: Normal. Negative for fracture or focal lesion. Sinuses/Orbits: Negative Other: Critical Value/emergent results were called by telephone at the time of interpretation on 05/23/2021 at 9:10 am to provider [facility] , who verbally acknowledged these results. ASPECTS [employer] Stroke Program Early CT Score) Not scored in this setting IMPRESSION: Progressed right frontal hematoma and vasogenic edema with midline shift now measuring 13 mm. Electronically Signed   By: Marnee Spring M.D.   On: 05/23/2021 09:12   CT HEAD CODE STROKE WO CONTRAST  Result Date: 05/11/2021 CLINICAL DATA:  Code stroke. Left-sided weakness, nausea, and vomiting. EXAM: CT HEAD WITHOUT CONTRAST TECHNIQUE: Contiguous axial images were obtained from the base of the skull through the vertex without intravenous contrast. COMPARISON:  None. FINDINGS: Brain: An acute parenchymal hemorrhage in the high posterior right frontal lobe measures 4.2 x 2.3 x 3.1 cm (approximate volume of 15  mL). There is mild surrounding edema without midline shift, and there is a small amount of adjacent subarachnoid hemorrhage. A very small amount of coexistent subdural hemorrhage is also not excluded in this region. No acute infarct is identified elsewhere. The ventricles are normal in size. Hypodensities in the cerebral white matter bilaterally are nonspecific but compatible with mild chronic small vessel ischemic disease. Vascular: No hyperdense vessel. Skull: No fracture or suspicious osseous lesion. Sinuses/Orbits: Small osteoma in the left frontal sinus. Mild right and moderate left maxillary sinus mucosal thickening. Clear mastoid air cells. Unremarkable orbits. Other: None. ASPECTS [facility] Stroke Program Early CT Score) Not scored in the presence of acute hemorrhage. IMPRESSION: Acute parenchymal hemorrhage in the posterior right frontal lobe with small amount of adjacent subarachnoid hemorrhage. These results were communicated to Dr. Iver Nestle  at 9:08 am on 05/11/2021 by text page via the [facility] messaging system. Electronically Signed   By: Sebastian Ache M.D.   On: 05/11/2021 09:12   VAS [place] LOWER EXTREMITY VENOUS (DVT)  Result Date: 05/26/2021  Lower Venous DVT Study Patient Name:  Donald [name]  Date of Exam:   05/26/2021 Medical Rec #: [id number]         Accession #:    [id number] Date of Birth: 03-18-1956          Patient Gender: M Patient Age:   65Y Exam Location:  [facility] Procedure:      VAS [place] LOWER EXTREMITY VENOUS (DVT) Referring Phys: [id number] Eliezer Champagne --------------------------------------------------------------------------------  Indications: Fever.  Limitations: Poor ultrasound/tissue interface. Comparison Study: No previous exams. Performing Technologist: Ernestene Mention RVT, RDMS  Examination Guidelines: A complete evaluation includes B-mode imaging, spectral Doppler, color Doppler, and power Doppler as needed of all accessible portions of each vessel. Bilateral testing is  considered an integral part of a complete examination. Limited examinations for reoccurring indications may be performed as noted. The reflux portion of the exam is performed with the patient in reverse Trendelenburg.  +---------+---------------+---------+-----------+----------+--------------+ RIGHT    CompressibilityPhasicitySpontaneityPropertiesThrombus Aging +---------+---------------+---------+-----------+----------+--------------+ CFV      Full           Yes      Yes                                 +---------+---------------+---------+-----------+----------+--------------+ SFJ      Full                                                        +---------+---------------+---------+-----------+----------+--------------+ FV Prox  Full           Yes      Yes                                 +---------+---------------+---------+-----------+----------+--------------+ FV Mid   Full           Yes      Yes                                 +---------+---------------+---------+-----------+----------+--------------+ FV DistalFull           Yes      Yes                                 +---------+---------------+---------+-----------+----------+--------------+ PFV      Full                                                        +---------+---------------+---------+-----------+----------+--------------+ POP      Full           Yes      Yes                                 +---------+---------------+---------+-----------+----------+--------------+  PTV      Full                                                        +---------+---------------+---------+-----------+----------+--------------+ PERO     Full                                                        +---------+---------------+---------+-----------+----------+--------------+   +---------+---------------+---------+-----------+----------+-------------------+ LEFT      CompressibilityPhasicitySpontaneityPropertiesThrombus Aging      +---------+---------------+---------+-----------+----------+-------------------+ CFV      Partial        Yes      Yes                  Acute- non                                                                occlusive           +---------+---------------+---------+-----------+----------+-------------------+ SFJ      Full                                                             +---------+---------------+---------+-----------+----------+-------------------+ FV Prox  Full           Yes      Yes                                      +---------+---------------+---------+-----------+----------+-------------------+ FV Mid   Full           Yes      Yes                                      +---------+---------------+---------+-----------+----------+-------------------+ FV DistalFull           Yes      Yes                                      +---------+---------------+---------+-----------+----------+-------------------+ PFV      Full                                                             +---------+---------------+---------+-----------+----------+-------------------+ POP      Full           Yes      Yes                                      +---------+---------------+---------+-----------+----------+-------------------+  PTV                                                   Not well visualized +---------+---------------+---------+-----------+----------+-------------------+ PERO     Full                                                             +---------+---------------+---------+-----------+----------+-------------------+ EIV                     Yes      Yes                  Patent by color and                                                       doppler             +---------+---------------+---------+-----------+----------+-------------------+     Summary:  BILATERAL: - No evidence of superficial venous thrombosis in the lower extremities, bilaterally. -No evidence of popliteal cyst, bilaterally. RIGHT: - There is no evidence of deep vein thrombosis in the lower extremity.  LEFT: - Findings consistent with acute deep vein thrombosis involving the left common femoral vein.  *See table(s) above for measurements and observations. Electronically signed by Coral Else MD on 05/26/2021 at 7:50:00 PM.    Final    CT ANGIO HEAD NECK W WO CM (CODE STROKE)  Result Date: 05/23/2021 CLINICAL DATA:  Enlarging hematoma EXAM: CT ANGIOGRAPHY HEAD AND NECK TECHNIQUE: Multidetector CT imaging of the head and neck was performed using the standard protocol during bolus administration of intravenous contrast. Multiplanar CT image reconstructions and MIPs were obtained to evaluate the vascular anatomy. Carotid stenosis measurements (when applicable) are obtained utilizing NASCET criteria, using the distal internal carotid diameter as the denominator. CONTRAST:  16mL OMNIPAQUE IOHEXOL 350 MG/ML SOLN COMPARISON:  CTA 05/11/2021 FINDINGS: CTA NECK FINDINGS Aortic arch: Normal Right carotid system: Widely patent vessels with smooth contour. No atheromatous changes Left carotid system: Widely patent vessels with smooth contour. No noted atheromatous changes Vertebral arteries: No proximal subclavian stenosis. The vertebral arteries are widely patent. No suspected vertebral beading when allowing for streak artifact. Skeleton: Multilevel degenerative disc narrowing and ridging canal and foraminal encroachment the lower cervical levels. Other neck: No evidence of inflammation or mass. Upper chest: Negative Review of the MIP images confirms the above findings CTA HEAD FINDINGS Anterior circulation: Major vessels are smooth and widely patent. No branch occlusion, generalized beading, or aneurysm. There is a cortical branch at the right frontal parietal convexity which is not continuous beyond  the level of the upper and lateral hematoma. No visible nidus or early enhancing veins. Posterior circulation: Vertebral and basilar arteries are smooth and widely patent. No branch occlusion, beading, or aneurysm. Venous sinuses: MRV was obtained previously. The veins are largely nonenhancing on this study. Anatomic variants: None significant Review of the MIP images confirms the above findings IMPRESSION: Seemingly truncated arterial cortical branch at the  upper and lateral hematoma, without discrete vascular malformation or spot sign. Recommend follow-up after resolution of mass effect to evaluate for small AVM. Electronically Signed   By: Marnee Spring M.D.   On: 05/23/2021 09:54   CT ANGIO HEAD NECK W WO CM (CODE STROKE)  Result Date: 05/11/2021 CLINICAL DATA:  Stroke follow-up.  Left-sided weakness. EXAM: CT ANGIOGRAPHY HEAD AND NECK TECHNIQUE: Multidetector CT imaging of the head and neck was performed using the standard protocol during bolus administration of intravenous contrast. Multiplanar CT image reconstructions and MIPs were obtained to evaluate the vascular anatomy. Carotid stenosis measurements (when applicable) are obtained utilizing NASCET criteria, using the distal internal carotid diameter as the denominator. CONTRAST:  50mL OMNIPAQUE IOHEXOL 350 MG/ML SOLN COMPARISON:  Same day CT head. FINDINGS: CTA NECK FINDINGS Aortic arch: Great vessel origins are patent. Right carotid system: No evidence of dissection, stenosis (50% or greater) or occlusion. Mild apparent irregularity of the ICA at the skull base in a region of streak artifact. Left carotid system: No evidence of dissection, stenosis (50% or greater) or occlusion. Mild apparent irregularity of the ICA at the skull base in a region of streak artifact. Vertebral arteries: Codominant. No evidence of dissection, stenosis (50% or greater) or occlusion. Skeleton: Moderate degenerative disc disease at at C5-C6 and C6-C7 with disc height  loss, endplate sclerosis and posterior disc osteophyte complexes. Other neck: No acute abnormality Upper chest: Visualized lung apices are clear. Review of the MIP images confirms the above findings CTA HEAD FINDINGS Anterior circulation: No large vessel occlusion or proximal hemodynamically significant stenosis. Evaluation of the distal vasculature is limited due to venous contamination. No evidence of and arteriovenous malformation or aneurysm in the region of intraparenchymal hemorrhage, although acute blood products limit evaluation. Posterior circulation: No large vessel occlusion or proximal hemodynamically significant stenosis. Venous sinuses: The superior sagittal sinus is narrowed in the region of hemorrhage, most likely from mass effect. Small right transverse and sigmoid sinuses. Review of the MIP images confirms the above findings IMPRESSION: CTA Head: 1. No large vessel occlusion or hemodynamically significant proximal arterial stenosis. 2. The superior sagittal sinus is narrowed in the region of hemorrhage with poorly visualized draining cortical veins in this region. While indeterminate, this may be secondary to mass effect given no definite/clear intraluminal thrombus. Given these findings and the location of hemorrhage; however, recommend low threshold for follow-up CTV or MRI with contrast to ensure stability and exclude worsening thrombosis. 3. No evidence of and arteriovenous malformation or aneurysm in the region of intraparenchymal hemorrhage, although acute blood products limit evaluation. Follow-up imaging after resolution of hemorrhage could provide further assessment if clinically indicated. CTA Neck: 1. No significant (greater than 50%) stenosis. 2. Mild apparent irregularity of bilateral ICAs at the skull base is favored artifactual given streak artifact from dental amalgam in this region. Fibromuscular dysplasia is a differential consideration. Findings and recommendations discussed with  Dr. Iver Nestle via telephone at 9:59 a.m. 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 EXAM: BILATERAL COMMON CAROTID AND INNOMINATE ANGIOGRAPHY COMPARISON:  CT angiogram of the head and neck of May 23, 2021, and May 11, 2021. MEDICATIONS: Heparin none. No antibiotic was administered within 1 hour of the procedure. ANESTHESIA/SEDATION: Versed 1.5 mg IV; Fentanyl 37.5 mcg IV Moderate Sedation Time:  52  minutes The patient was continuously monitored during the procedure by the interventional radiology nurse under my direct supervision. CONTRAST:  Isovue 300 approximately 80 mL. FLUOROSCOPY TIME:  Fluoroscopy Time: 15 minutes 24 seconds (1503 mGy). COMPLICATIONS: None immediate. TECHNIQUE: Informed written consent was obtained from the patient after a thorough discussion of the procedural risks, benefits and alternatives. All questions were addressed. Maximal Sterile Barrier Technique was utilized including caps, mask, sterile gowns, sterile gloves, sterile drape, hand hygiene and skin antiseptic. A timeout was performed prior to the initiation of the procedure. The right groin was prepped and draped in the usual sterile fashion. Thereafter using modified Seldinger technique, transfemoral access into the right common femoral artery was obtained without difficulty. Over a 0.035 inch guidewire, a 5 French Pinnacle sheath was inserted. Through this, and also over 0.035 inch guidewire, a 5 [place] JB 1 catheter was advanced to the aortic arch region and selectively positioned in the right common carotid artery, the right vertebral artery, the left common carotid artery and the left vertebral artery. FINDINGS: The right vertebral artery origin is widely patent. The vessel is seen to opacify to the cranial skull base. Wide  opacification is seen of right vertebrobasilar junction and the right posterior-inferior cerebellar artery. The basilar artery, the posterior cerebral arteries, the superior cerebellar arteries and the anterior-inferior cerebellar arteries opacify into the capillary and venous phases. Venous phase demonstrates non opacification of the right transverse sinus extending from the right lateral origin of the torcula to the transverse sigmoid junction. Retrograde opacification of the sigmoid sinus is seen from the superior cerebellar veins with retrograde flow into the right transverse sinus sigmoid junction. The right common carotid arteriogram demonstrates the right external carotid artery and its major branches to be widely patent. The right internal carotid artery at the bulb to the cranial skull base is widely patent. The petrous, the cavernous and the supraclinoid segments are widely patent. The right middle cerebral artery is patent into the anterior temporal branch and the bifurcation branches. Bundling of the right middle cerebral artery M3 M4 branch is noted secondary to the superiorly and the medially located intracranial hemorrhage. The right anterior cerebral artery opacifies into the capillary and venous phases. The venous phase demonstrates a minimally opacified right transverse sinus. The delayed lateral projection of the DSA demonstrates delayed clearance of the arterial portion of the contrast compared to the venous. The left common carotid arteriogram demonstrates the left external carotid artery and its major branches to be widely patent. The left internal carotid artery at the bulb to the cranial skull base is widely patent. The petrous, the cavernous and the supraclinoid segments are widely patent. The left middle cerebral and the left anterior cerebral artery opacify into the capillary and venous phases. Left vertebral origin is widely patent. The vessel is seen to opacify to the cranial skull base.  The left vertebrobasilar junction and the left posterior-inferior cerebral artery demonstrate wide patency. The basilar artery, and the opacified portions of the posterior cerebral arteries, the superior cerebellar arteries and the anterior-inferior cerebellar arteries is seen into the capillary and the venous phases. The venous phase again demonstrates non opacification of the right transverse sinus with suggestion of small dural veins. Again demonstrated is the retrograde opacification of the right sigmoid sinus transverse sinus junction from the ipsilateral superior cerebellar venous structures. IMPRESSION: Non opacification of the right transverse sinus extending from the torcula to the right transverse sinus/sigmoid sinus junction with retrograde opacification of the right sigmoid  sinus from the superior cerebellar veins on the on the right side. Above findings supportive of venous hypertension with delayed clearance of contrast from the right cerebral hemisphere, and also with retrograde opacification of the right sigmoid sinus from the right superior cerebellar hemispheric veins. Attenuated caliber of the right sigmoid sinus noted though the right internal jugular vein appears comparable in size compared to the contralateral left side. Angiographically no evidence of intracranial aneurysm arteriovenous shunting, or of arteriovenous malformation or dissections. PLAN: Follow-up MRA of the brain, and MRV of the dural venous sinuses with and without contrast in 3 months. Electronically Signed   By: Julieanne Cotton M.D.   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 EXAM: BILATERAL COMMON CAROTID AND INNOMINATE ANGIOGRAPHY COMPARISON:  CT angiogram of the head and neck of May 23, 2021, and May 11, 2021. MEDICATIONS: Heparin none. No antibiotic was administered within 1 hour of the  procedure. ANESTHESIA/SEDATION: Versed 1.5 mg IV; Fentanyl 37.5 mcg IV Moderate Sedation Time:  52 minutes The patient was continuously monitored during the procedure by the interventional radiology nurse under my direct supervision. CONTRAST:  Isovue 300 approximately 80 mL. FLUOROSCOPY TIME:  Fluoroscopy Time: 15 minutes 24 seconds (1503 mGy). COMPLICATIONS: None immediate. TECHNIQUE: Informed written consent was obtained from the patient after a thorough discussion of the procedural risks, benefits and alternatives. All questions were addressed. Maximal Sterile Barrier Technique was utilized including caps, mask, sterile gowns, sterile gloves, sterile drape, hand hygiene and skin antiseptic. A timeout was performed prior to the initiation of the procedure. The right groin was prepped and draped in the usual sterile fashion. Thereafter using modified Seldinger technique, transfemoral access into the right common femoral artery was obtained without difficulty. Over a 0.035 inch guidewire, a 5 French Pinnacle sheath was inserted. Through this, and also over 0.035 inch guidewire, a 5 [place] JB 1 catheter was advanced to the aortic arch region and selectively positioned in the right common carotid artery, the right vertebral artery, the left common carotid artery and the left vertebral artery. FINDINGS: The right vertebral artery origin is widely patent. The vessel is seen to opacify to the cranial skull base. Wide opacification is seen of right vertebrobasilar junction and the right posterior-inferior cerebellar artery. The basilar artery, the posterior cerebral arteries, the superior cerebellar arteries and the anterior-inferior cerebellar arteries opacify into the capillary and venous phases. Venous phase demonstrates non opacification of the right transverse sinus extending from the right lateral origin of the torcula to the transverse sigmoid junction. Retrograde opacification of the sigmoid sinus is seen from  the superior cerebellar veins with retrograde flow into the right transverse sinus sigmoid junction. The right common carotid arteriogram demonstrates the right external carotid artery and its major branches to be widely patent. The right internal carotid artery at the bulb to the cranial skull base is widely patent. The petrous, the cavernous and the supraclinoid segments are widely patent. The right middle cerebral artery is patent into the anterior temporal branch and the bifurcation branches. Bundling of the right middle cerebral artery M3 M4 branch is noted secondary to the superiorly and the medially located intracranial hemorrhage. The right anterior cerebral artery opacifies into the capillary and venous phases. The venous phase demonstrates a minimally opacified right transverse sinus. The delayed lateral projection of the DSA demonstrates delayed clearance of the arterial portion of the  contrast compared to the venous. The left common carotid arteriogram demonstrates the left external carotid artery and its major branches to be widely patent. The left internal carotid artery at the bulb to the cranial skull base is widely patent. The petrous, the cavernous and the supraclinoid segments are widely patent. The left middle cerebral and the left anterior cerebral artery opacify into the capillary and venous phases. Left vertebral origin is widely patent. The vessel is seen to opacify to the cranial skull base. The left vertebrobasilar junction and the left posterior-inferior cerebral artery demonstrate wide patency. The basilar artery, and the opacified portions of the posterior cerebral arteries, the superior cerebellar arteries and the anterior-inferior cerebellar arteries is seen into the capillary and the venous phases. The venous phase again demonstrates non opacification of the right transverse sinus with suggestion of small dural veins. Again demonstrated is the retrograde opacification of the right  sigmoid sinus transverse sinus junction from the ipsilateral superior cerebellar venous structures. IMPRESSION: Non opacification of the right transverse sinus extending from the torcula to the right transverse sinus/sigmoid sinus junction with retrograde opacification of the right sigmoid sinus from the superior cerebellar veins on the on the right side. Above findings supportive of venous hypertension with delayed clearance of contrast from the right cerebral hemisphere, and also with retrograde opacification of the right sigmoid sinus from the right superior cerebellar hemispheric veins. Attenuated caliber of the right sigmoid sinus noted though the right internal jugular vein appears comparable in size compared to the contralateral left side. Angiographically no evidence of intracranial aneurysm arteriovenous shunting, or of arteriovenous malformation or dissections. PLAN: Follow-up MRA of the brain, and MRV of the dural venous sinuses with and without contrast in 3 months. Electronically Signed   By: Julieanne Cotton M.D.   On: 05/28/2021 16:01     PHYSICAL EXAM  Temp:  [99 F (37.2 C)-99.4 F (37.4 C)] 99.2 F (37.3 C) (06/20 0400) Pulse Rate:  [86-96] 89 (06/20 0700) Resp:  [13-16] 14 (06/20 0700) BP: (124-153)/(79-92) 139/87 (06/20 0700) SpO2:  [97 %-100 %] 100 % (06/20 0700) Weight:  [66.1 kg] 66.1 kg (06/20 0459)  General - Well nourished, well developed, middle-aged African-American male sleepy but arousable.  Ophthalmologic - fundi not visualized due to noncooperation.  Cardiovascular - Regular rhythm and mild tachycardia.  Neuro - sleepy but eyes open with voice, he was able to follow peripheral commands on the right hand and most central commands.  Moderate dysarthria, oriented to place and people age but not to time. Right gaze preference, but unable to cross midline. Not blinking to visual threat left greater than right. Left facial droop. Tongue protrusion not cooperative.  Right UE spontaneous movement against gravity and RLE some spontaneous movement but not against gravity, LUE and LLE plegic with increased muscle tone. Sensation, coordination not cooperative and gait not tested.   ASSESSMENT/PLAN [name] is a 65 y.o. male with history of dementia with behavioral disturbance and OSA on CPAP admitted for right gaze and left hemoplegia, left facial droop. 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 He was stabilized and sent to CIR. 6/12 found to be lethargic and drowsy, CT repeat showed increased cerebral edema and midline shift. EEG no seizure s/p ativan and keppra. He was transfer back to ICU for further management.   Encephalopathy  Improving Likely combination of cerebral edema, ? Seiziure, fever, leukocytosis and AKI Treat underlying condition as below Neuro check closely  Cerebral edema  CT head  6/12 showed progression of right frontal ICH and cerebral edema with midline shift CTA head and neck 6/12 no discrete AVM or spot sign CT repeat 6/16 unchanged, no progression of midline shift or hematoma. On 3% saline 50->65->30 cc/h->off Na Q6h Na 135->140->144->151->158->155->156->157>158 Na goal 145-155  ? 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 No CSVT on venous phase CT repeat mildly increased left frontal ICH 05/12/21 MRI brain no significant change of ICH since last CT MRV Non dominant right transverse and sigmoid dural venous sinuses. CT repeat 6/16 unchanged, no progression of midline shift or hematoma. Cerebral angiogram no AVM or aneurysm, concerning for Poor to no sig opacification of RT  transverse sinus in its entirety, ?Suspicious for thrombotic occlusion. 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  No evidence of clot on venous sinus thrombosis.   2D Echo  EF 65-70% LDL 140 HgbA1c 5.8 UDS neg Heparin subq for VTE prophylaxis No antithrombotic prior to admission, now on No antithrombotic given ICH Ongoing aggressive stroke risk factor management Therapy recommendations:  pending Disposition:  Pending   ?? 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  DVT LE venous Doppler showed left common femoral vein DVT Not a candidate for anticoagulation at this time S/p IVC filter 6/16  Dementia  Has appointment with [facility] for LP and evaluation of dementia, but pt not able to go due to onset of ICH Delirium precaution Continue outpt follow up with [facility]  Hyperlipidemia Home meds:  none  LDL 140, goal < 70 Will consider low dose statin (lipitor 20) on discharge   Dysphagia  Speech to follow NPO  S/p cortrak on TF @ 50  Fever and leukocytosis Tmax 101.4->102.6->100.9->102.4->101.7->afebrile Leukocytosis WBC 7.6->12.4 ->8.1->7.8->22.9->20.4->24.8-> 23.9->17.7 CCM on board Continue monitoring UA neg CXR Minimal left basilar atelectasis On Rocephin now  Other Stroke Risk Factors Advanced age Obstructive sleep apnea, on CPAP at  home  Other Active Problems AKI Cre 1.20->1.45->1.03->1.02->0.98->1.09 - on IVF and TF  Hospital day # 8 Patient continues to show some progress.   Continue ongoing therapies and continue mobilization out of bed as tolerated  .  Long discussion with patient and wife at the bedside and brother over the phone answered questions.  Discussed with Dr. Ella Jubilee.  May need to consider PEG tube if dysphagia does not improve soon.  Greater than 50% time during this 25-minute visit was spent on counseling and coordination of care about his dysphagia and stroke and answering questions.  Delia Heady, MD   05/31/2021 5:28 PM    To contact  Stroke Continuity provider, please refer to [email or web address] After hours, contact General Neurology

## 2021-06-01 LAB — GLUCOSE, CAPILLARY
Glucose-Capillary: 156 mg/dL — ABNORMAL HIGH (ref 70–99)
Glucose-Capillary: 195 mg/dL — ABNORMAL HIGH (ref 70–99)
Glucose-Capillary: 195 mg/dL — ABNORMAL HIGH (ref 70–99)
Glucose-Capillary: 208 mg/dL — ABNORMAL HIGH (ref 70–99)
Glucose-Capillary: 210 mg/dL — ABNORMAL HIGH (ref 70–99)
Glucose-Capillary: 229 mg/dL — ABNORMAL HIGH (ref 70–99)

## 2021-06-01 LAB — BASIC METABOLIC PANEL
Anion gap: 4 — ABNORMAL LOW (ref 5–15)
BUN: 24 mg/dL — ABNORMAL HIGH (ref 8–23)
CO2: 29 mmol/L (ref 22–32)
Calcium: 9 mg/dL (ref 8.9–10.3)
Chloride: 114 mmol/L — ABNORMAL HIGH (ref 98–111)
Creatinine, Ser: 0.96 mg/dL (ref 0.61–1.24)
GFR, Estimated: >60 mL/min (ref >60)
Glucose, Bld: 213 mg/dL — ABNORMAL HIGH (ref 70–99)
Potassium: 4.4 mmol/L (ref 3.5–5.1)
Sodium: 147 mmol/L — ABNORMAL HIGH (ref 135–145)

## 2021-06-01 MED ORDER — AMANTADINE HCL 50 MG/5ML PO SOLN
100.0000 mg | Freq: Two times a day (BID) | ORAL | Status: DC
  Administered 2021-06-02 – 2021-06-04 (×4): 100 mg via ORAL
  Filled 2021-06-01 (×8): qty 10

## 2021-06-01 MED ORDER — WHITE PETROLATUM EX OINT
TOPICAL_OINTMENT | CUTANEOUS | Status: AC
  Filled 2021-06-01: qty 28.35

## 2021-06-01 MED ORDER — INSULIN GLARGINE 100 UNIT/ML ~~LOC~~ SOLN
5.0000 [IU] | Freq: Every day | SUBCUTANEOUS | Status: DC
  Administered 2021-06-01 – 2021-06-04 (×4): 5 [IU] via SUBCUTANEOUS
  Filled 2021-06-01 (×4): qty 0.05

## 2021-06-01 NOTE — Progress Notes (Signed)
Physical Therapy Treatment Patient Details Name: Donald [name] MRN: [id number] DOB: 19-Jun-1956 Today's Date: 06/01/2021    History of Present Illness The pt is a 65 yo male re-admitted from CIR on 6/12 due to sudden onset of worsening mental status, seizures, and facial droop. Repeat CT revealed enlargement of original R frontal ICH with worsening midline shift. Significant PMH includes: memory deficits, OSA.Marland Kitchen    PT Comments    Pt was seen for mobility on Side of bed with 2 max assist to get up and balance, with surprising moments of min to mod assist to hold posture in sitting midline.  Pt used pillows to support UE;s and tried AAROM to L LE with no success in eliciting active movement.  Put LLE in supported posture with PRAFO, and RLE with pillow for both to get skin protection and midline joint protection.  Follow for acute PT goals, and work recommended with OT to work on skills of upper body to wake pt up.  MD in to agree that pt is too lethargic now, and plans to adjust meds for alertness.  Follow acutely for goals of PT.   Follow Up Recommendations  CIR     Equipment Recommendations  3in1 (PT);Wheelchair (measurements PT);Wheelchair cushion (measurements PT);Hospital bed    Recommendations for Other Services Rehab consult     Precautions / Restrictions Precautions Precautions: Fall;Other (comment) Precaution Comments: L hemiparesis, L inattention, pusher to the L, cognitive deficits, cortrak Restrictions Weight Bearing Restrictions: No    Mobility  Bed Mobility Overal bed mobility: Needs Assistance Bed Mobility: Supine to Sit;Sit to Supine Rolling: +2 for physical assistance;+2 for safety/equipment;Min assist   Supine to sit: Total assist;+2 for physical assistance;HOB elevated Sit to supine: Total assist;+2 for physical assistance;HOB elevated   General bed mobility comments: lethargic. total A for all aspects.    Transfers                 General transfer  comment: unable  Ambulation/Gait                 Stairs             Wheelchair Mobility    Modified Rankin (Stroke Patients Only)       Balance Overall balance assessment: Needs assistance Sitting-balance support: Bilateral upper extremity supported;Single extremity supported;Feet supported Sitting balance-Leahy Scale: Poor Sitting balance - Comments: Pusher to the L                                    Cognition Arousal/Alertness: Lethargic Behavior During Therapy: Flat affect Overall Cognitive Status: Difficult to assess Area of Impairment: Following commands;Safety/judgement;Awareness;Problem solving                 Orientation Level: Disoriented to;Situation;Place Current Attention Level: Focused Memory: Decreased short-term memory Following Commands: Follows one step commands inconsistently;Follows one step commands with increased time Safety/Judgement: Decreased awareness of safety;Decreased awareness of deficits Awareness: Intellectual Problem Solving: Requires verbal cues;Requires tactile cues;Slow processing;Decreased initiation;Difficulty sequencing General Comments: pt with decreased verbalizations this session, mumbling at times but no discernible speech. folling commands with R extremity with increased time and assist at times due to weakness. not following commands for eye opening or holding head up, but pt was given seizure medicines prior to session which may be affecting alertness      Exercises      General Comments General comments (skin integrity, edema,  etc.): HR 120-128      Pertinent Vitals/Pain Pain Assessment: Faces Faces Pain Scale: No hurt    Home Living                      Prior Function            PT Goals (current goals can now be found in the care plan section) Acute Rehab PT Goals Patient Stated Goal: spouse wants pt to return to CIR Progress towards PT goals: Progressing toward  goals    Frequency    Min 4X/week      PT Plan Current plan remains appropriate    Co-evaluation PT/OT/SLP Co-Evaluation/Treatment: Yes Reason for Co-Treatment: Complexity of the patient's impairments (multi-system involvement) PT goals addressed during session: Mobility/safety with mobility;Balance        AM-PAC PT "6 Clicks" Mobility   Outcome Measure  Help needed turning from your back to your side while in a flat bed without using bedrails?: Total Help needed moving from lying on your back to sitting on the side of a flat bed without using bedrails?: Total Help needed moving to and from a bed to a chair (including a wheelchair)?: Total Help needed standing up from a chair using your arms (e.g., wheelchair or bedside chair)?: Total Help needed to walk in hospital room?: Total Help needed climbing 3-5 steps with a railing? 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 Number: [facility] [id number] and [facility] [phone]

## 2021-06-01 NOTE — Progress Notes (Signed)
STROKE TEAM PROGRESS NOTE   SUBJECTIVE (INTERVAL HISTORY) His family member is at the bedside. Pt neuro stable, no acute event overnight.    Patient is sleepy but can be aroused and appears and interactive.  He is afebrile.  Vital signs are stable.  Neurological exam is unchanged.  Blood pressure adequately controlled.   Continues to have dysphagia and has tube feeding.  He continues to participate with his therapist and therapist recommend inpatient rehab.  Patient's wife feels that Keppra makes him sleepy and is wondering if it can be stopped.  His EEGs have shown generalized slowing but no definite epileptiform activity. OBJECTIVE Temp:  [97.6 F (36.4 C)-100.5 F (38.1 C)] 100.5 F (38.1 C) (06/21 1200) Pulse Rate:  [107-110] 110 (06/21 1200) Cardiac Rhythm: Sinus tachycardia (06/21 0921) Resp:  [18-20] 20 (06/21 1200) BP: (111-134)/(74-88) 132/78 (06/21 1200) SpO2:  [92 %-98 %] 95 % (06/21 1200) Weight:  [67 kg] 67 kg (06/21 0451)  Recent Labs  Lab 05/31/21 2051 06/01/21 0012 06/01/21 0420 06/01/21 0843 06/01/21 1204  GLUCAP 186* 208* 195* 156* 195*   Recent Labs  Lab 05/27/21 0300 05/27/21 1637 05/28/21 0450 05/28/21 1636 05/29/21 0617 05/29/21 1655 05/30/21 0543 05/31/21 0449 06/01/21 0316  NA 156*   < > 157*   < > 158* 160* 156* 152* 147*  K 4.2  --  3.8  --  3.6  --   --  4.4 4.4  CL 119*  --  114*  --  124*  --   --  112* 114*  CO2 30  --  31  --  27  --   --  32 29  GLUCOSE 93  --  134*  --  250*  --   --  210* 213*  BUN 25*  --  26*  --  25*  --   --  20 24*  CREATININE 0.98  --  1.09  --  0.98  --   --  1.09 0.96  CALCIUM 9.8  --  10.4*  --  9.4  --   --  9.1 9.0   < > = values in this interval not displayed.   No results for input(s): AST, ALT, ALKPHOS, BILITOT, PROT, ALBUMIN in the last 168 hours.  Recent Labs  Lab 05/26/21 0335 05/27/21 0300 05/28/21 0450 05/29/21 0617 05/31/21 0449  WBC 24.8* 23.9* 17.7* 12.9* 12.8*  NEUTROABS  --   --   --    --  10.0*  HGB 14.0 14.3 15.6 13.5 12.9*  HCT 43.9 44.6 49.7 43.8 41.9  MCV 94.4 93.3 95.4 95.4 96.8  PLT 360 425* 492* 434* 376   No results for input(s): CKTOTAL, CKMB, CKMBINDEX, TROPONINI in the last 168 hours. No results for input(s): LABPROT, INR in the last 72 hours. No results for input(s): COLORURINE, LABSPEC, PHURINE, GLUCOSEU, HGBUR, BILIRUBINUR, KETONESUR, PROTEINUR, UROBILINOGEN, NITRITE, LEUKOCYTESUR in the last 72 hours.  Invalid input(s): APPERANCEUR      Component Value Date/Time   CHOL 232 (H) 05/11/2021 1118   CHOL 204 (H) 09/11/2020 1449   TRIG 73 05/11/2021 1118   HDL 77 05/11/2021 1118   HDL 63 09/11/2020 1449   CHOLHDL 3.0 05/11/2021 1118   VLDL 15 05/11/2021 1118   LDLCALC 140 (H) 05/11/2021 1118   LDLCALC 132 (H) 09/11/2020 1449   LDLCALC 107 (H) 04/29/2020 0826   Lab Results  Component Value Date   HGBA1C 5.8 (H) 05/11/2021      Component Value Date/Time  LABOPIA NONE DETECTED 05/11/2021 1109   COCAINSCRNUR NONE DETECTED 05/11/2021 1109   LABBENZ NONE DETECTED 05/11/2021 1109   AMPHETMU NONE DETECTED 05/11/2021 1109   THCU NONE DETECTED 05/11/2021 1109   LABBARB NONE DETECTED 05/11/2021 1109    No results for input(s): ETH in the last 168 hours.   I have personally reviewed the radiological images below and agree with the radiology interpretations.  DG Abd 1 View  Result Date: 05/20/2021 CLINICAL DATA:  Abdominal pain. EXAM: ABDOMEN - 1 VIEW COMPARISON:  None. FINDINGS: Large amount of intestinal gas throughout small and large bowel suggesting ileus. There does not appear to be a large amount of fecal matter. No focal obstruction is evident. No free air. No abnormal calcifications or significant bone findings. IMPRESSION: Large amount of intestinal gas suggesting ileus. Electronically Signed   By: Paulina Fusi M.D.   On: 05/20/2021 19:25   CT HEAD WO CONTRAST  Result Date: 05/27/2021 CLINICAL DATA:  65 year old male with right hemisphere  hemorrhage at code stroke presentation 05/11/2021. Subsequent encounter. EXAM: CT HEAD WITHOUT CONTRAST TECHNIQUE: Contiguous axial images were obtained from the base of the skull through the vertex without intravenous contrast. COMPARISON:  Head CT 05/23/2021 and earlier. FINDINGS: Brain: Evolving right superior frontal lobe hemorrhage. Slowly fading hyperdense and mixed density blood products involving the superior and middle frontal gyri in an area of up to 48 x 35 x 50 mm (AP by transverse by CC). Continued regional edema and mass effect including effaced right lateral ventricle and leftward midline shift up to 7 mm at the midportion of the septum pellucidum. No ventriculomegaly. No intraventricular or significant extra-axial hemorrhage. Basilar cisterns remain patent. Stable gray-white matter differentiation elsewhere. No acute cortically based infarct. Vascular: Mild Calcified atherosclerosis at the skull base. Skull: Stable, negative. Sinuses/Orbits: Mild to moderate maxillary alveolar recess mucosal thickening. Otherwise paranasal sinuses and mastoids are stable and well aerated. Other: Right nasoenteric tube in place. Visualized orbits and scalp soft tissues are within normal limits. IMPRESSION: 1. Slowly evolving right superior frontal lobe hemorrhage, not significantly changed in size or configuration since 05/23/2021, with stable regional edema and mass effect. 2. No new intracranial abnormality. Electronically Signed   By: Odessa Fleming M.D.   On: 05/27/2021 06:24   CT HEAD WO CONTRAST  Result Date: 05/23/2021 CLINICAL DATA:  Follow-up intracerebral hemorrhage EXAM: CT HEAD WITHOUT CONTRAST TECHNIQUE: Contiguous axial images were obtained from the base of the skull through the vertex without intravenous contrast. COMPARISON:  CT 05/23/2021 FINDINGS: Brain: No significant interval change in the overall size of the intraparenchymal hemorrhagic component seen in the right frontal with some extensive  surrounding hypo attenuating edematous changes. Locoregional mass effect with sulcal effacement and effacement of the right lateral ventricle as well as stable subfalcine herniation with a right to left midline shift of approximately 14 mm. Slight medialization of the right uncus without clear transtentorial herniation is also unchanged from prior. Basal cisterns remain patent. No new sites of hemorrhage. No new loss of gray-white differentiation. Vascular: No hyperdense vessel or unexpected calcification. Skull: Normal. Negative for fracture or focal lesion. EEG leads in place. Sinuses/Orbits: Mild mural thickening in the maxillary sinuses. Paranasal sinuses and mastoid air cells are otherwise predominantly clear. Included orbital structures are unremarkable. Other: None. IMPRESSION: 1. Stable appearance of the right frontal lobe intraparenchymal hemorrhage with surrounding cerebral edema, local mass effect as well as subfalcine midline shift of approximately 14 mm in slight medial ization of the right uncus,  not significantly changed from comparison imaging. 2. No new sites of hemorrhage or new gray-white differentiation loss. Electronically Signed   By: Kreg Shropshire M.D.   On: 05/23/2021 23:58   CT HEAD WO CONTRAST  Result Date: 05/23/2021 CLINICAL DATA:  Follow-up right frontal parenchymal hemorrhage seen earlier today. EXAM: CT HEAD WITHOUT CONTRAST TECHNIQUE: Contiguous axial images were obtained from the base of the skull through the vertex without intravenous contrast. COMPARISON:  Earlier today. FINDINGS: Brain: No significant change in the previously described high right frontal lobe parenchymal hematoma with extensive surrounding edema and midline shift to the left. The midline shift currently measures 10 mm on image number 16/3. No interval ventricular enlargement. No new hemorrhage. Vascular: No hyperdense vessel or unexpected calcification. Skull: Normal. Negative for fracture or focal lesion.  Sinuses/Orbits: Stable small left frontal sinus osteoma. Mild moderate left maxillary sinus mucosal thickening without significant change. Unremarkable orbits. Other: Bilateral concha bullosa. IMPRESSION: 1. Stable large high right frontal parenchymal hematoma with extensive associated edema and midline shift to the left. 2. Stable left maxillary chronic sinusitis. Electronically Signed   By: Beckie Salts M.D.   On: 05/23/2021 18:00   CT HEAD WO CONTRAST  Result Date: 05/23/2021 CLINICAL DATA:  Cerebral hemorrhage, decreased responsiveness EXAM: CT HEAD WITHOUT CONTRAST TECHNIQUE: Contiguous axial images were obtained from the base of the skull through the vertex without intravenous contrast. COMPARISON:  Earlier same day FINDINGS: Brain: Parenchymal hematoma centered within the high right frontal lobe does not measure substantially changed from the prior study. Extensive surrounding edema is again noted with regional mass effect. Leftward midline shift measures similar at 14 mm at the level of the septum pellucidum. Ventricle caliber is unchanged with no new trapping. No significant central herniation. There is no new loss of gray-white differentiation. Intraventricular hemorrhage is identified. Vascular: No new findings. Skull: Unremarkable. Sinuses/Orbits: No acute finding. Other: None. IMPRESSION: No substantial change in right frontal parenchymal hemorrhage, edema, and mass effect including midline shift. No new ventricle trapping or acute infarction. Electronically Signed   By: Guadlupe Spanish M.D.   On: 05/23/2021 12:49   CT HEAD WO CONTRAST  Result Date: 05/11/2021 CLINICAL DATA:  Stroke.  Intracranial hemorrhage follow-up. EXAM: CT HEAD WITHOUT CONTRAST TECHNIQUE: Contiguous axial images were obtained from the base of the skull through the vertex without intravenous contrast. COMPARISON:  Same day head CT. FINDINGS: Brain: Mild interval increase in the size of the right frontal intraparenchymal  hemorrhage, measuring 4.5 x 2.6 x 3.5 cm (previously 4.2 x 2.3 x 3.1 cm). Similar adjacent small volume subarachnoid hemorrhage. A small amount of subdural hemorrhage in this region is also not excluded. Surrounding edema is similar. Similar local mass effect without midline shift. Basal cisterns are patent. No evidence of acute large vascular territory infarct elsewhere. Similar mild white matter hypodensities, likely chronic microvascular ischemic disease. No hydrocephalus. Vascular: No hyperdense vessel.  Calcific atherosclerosis. Skull: No acute fracture. Sinuses/Orbits: Small left frontal sinus osteoma. Mild right and moderate left maxillary sinus mucosal thickening. Unremarkable orbits. Other: No mastoid effusions. IMPRESSION: Mild interval increase in the size of the right frontal intraparenchymal hemorrhage, measuring 4.5 x 2.6 x 3.5 cm (previously 4.2 x 2.3 x 3.1 cm). Similar adjacent small volume hemorrhage. Electronically Signed   By: Feliberto Harts MD   On: 05/11/2021 17:05   MR BRAIN WO CONTRAST  Result Date: 05/12/2021 CLINICAL DATA:  65 year old male code stroke presentation with intra-axial right hemisphere hemorrhage. Hypertensive (194/104) on presentation. EXAM:  MRI HEAD WITHOUT CONTRAST TECHNIQUE: Multiplanar, multiecho pulse sequences of the brain and surrounding structures were obtained without intravenous contrast. COMPARISON:  CT head CTA head and neck 05/11/2021 FINDINGS: Brain: Coronal T2 weighted imaging could not be obtained. And some of the exam is intermittently degraded by motion artifact despite repeated imaging attempts. T1 isointense intra-axial hemorrhage in the posterior right frontal lobe demonstrates a layering hematocrit level (series 13, image 19) and blood encompasses 43 by 38 x 33 mm (AP by transverse by CC) for an estimated volume of 26 mL. Regional edema. Mild regional mass effect. Trace subarachnoid hemorrhage also suspected on axial FLAIR imaging. Additionally, on  SWI there is also chronic hemosiderin in the right parietal lobe on series 19 image 34. But no other definite chronic blood products. Susceptibility related abnormal diffusion in the posterior right frontal lobe with no convincing larger area of restricted diffusion. No restricted diffusion elsewhere. No IVH or ventriculomegaly. Normal basilar cisterns. Negative pituitary and cervicomedullary junction. Wallace CullensGray and white matter signal outside of the affected right hemisphere is largely normal for age with mild nonspecific white matter changes. Vascular: Major intracranial vascular flow voids are preserved. Skull and upper cervical spine: Visualized bone marrow signal is within normal limits. Grossly negative cervical spine. Sinuses/Orbits: Negative orbits. Mild to moderate maxillary sinus mucosal thickening. Other: Mastoids are clear. Grossly normal visible internal auditory structures. IMPRESSION: 1. Posterior right frontal lobe intra-axial hemorrhage with layering hematocrit level has not significantly changed (estimated blood volume of 26 mL). Surrounding edema and mild regional mass effect. Trace subarachnoid hemorrhage. 2. No larger underlying infarct is evident. But chronic hemosiderin in the right parietal lobe indicates a previous intra-axial hemorrhage in the region. 3. No other acute intracranial abnormality. Electronically Signed   By: Odessa FlemingH  Hall M.D.   On: 05/12/2021 07:26   IR IVC FILTER PLMT / S&I Lenise Arena/IMG GUID/MOD SED  Result Date: 05/27/2021 INDICATION: 65 year old gentleman with lower extremity DVT and acute intracranial hemorrhage presents to IR for IVC filter placement EXAM: Retrievable IVC filter placement utilizing fluoroscopic and ultrasound guidance MEDICATIONS: None. ANESTHESIA/SEDATION: One mg IV Versed; 50 mcg IV Fentanyl Moderate Sedation Time:  12 minutes The patient was continuously monitored during the procedure by the interventional radiology nurse under my direct supervision. FLUOROSCOPY  TIME:  Fluoroscopy Time: 1 minutes 6 seconds (9 mGy). COMPLICATIONS: None immediate. PROCEDURE: Informed written consent was obtained from the patient after a thorough discussion of the procedural risks, benefits and alternatives. All questions were addressed. Maximal Sterile Barrier Technique was utilized including caps, mask, sterile gowns, sterile gloves, sterile drape, hand hygiene and skin antiseptic. A timeout was performed prior to the initiation of the procedure. Patient positioned supine on the angiography table. Right neck prepped and draped in usual sterile fashion. All elements of maximal sterile barrier were utilized including, cap, mask, sterile gown, sterile gloves, large sterile drape, hand scrubbing and 2% Chlorhexidine for skin cleaning. The right internal jugular vein was evaluated with ultrasound and shown to be patent. A permanent ultrasound image was obtained and placed in the patient's medical record. Using sterile gel and a sterile probe cover, the right internal jugular vein was entered with a 21 ga needle during real time ultrasound guidance. 21 gauge needle exchanged for a transitional dilator set over 0.018 inch guidewire. Transitional dilator set exchanged for 10 fr sheath over 0.035 inch guidewire. Sheath placed to the infrarenal IVC and CO2 venogram performed to delineate the position of the renal veins. Retrievable Denali IVC filter deployed  in the infrarenal location. Sheath removed and hemostasis achieved with manual compression. IMPRESSION: Retrievable Denali IVC filter placed. PLAN: This IVC filter is potentially retrievable. The patient will be assessed for filter retrieval by Interventional Radiology in approximately 8-12 weeks. Further recommendations regarding filter retrieval, continued surveillance or declaration of device permanence, will be made at that time. Electronically Signed   By: Acquanetta Belling M.D.   On: 05/27/2021 16:38   IR US Guide Vasc Access Right  Result  Date: 05/27/2021 INDICATION: 65 year old gentleman with lower extremity DVT and acute intracranial hemorrhage presents to IR for IVC filter placement EXAM: Retrievable IVC filter placement utilizing fluoroscopic and ultrasound guidance MEDICATIONS: None. ANESTHESIA/SEDATION: One mg IV Versed; 50 mcg IV Fentanyl Moderate Sedation Time:  12 minutes The patient was continuously monitored during the procedure by the interventional radiology nurse under my direct supervision. FLUOROSCOPY TIME:  Fluoroscopy Time: 1 minutes 6 seconds (9 mGy). COMPLICATIONS: None immediate. PROCEDURE: Informed written consent was obtained from the patient after a thorough discussion of the procedural risks, benefits and alternatives. All questions were addressed. Maximal Sterile Barrier Technique was utilized including caps, mask, sterile gowns, sterile gloves, sterile drape, hand hygiene and skin antiseptic. A timeout was performed prior to the initiation of the procedure. Patient positioned supine on the angiography table. Right neck prepped and draped in usual sterile fashion. All elements of maximal sterile barrier were utilized including, cap, mask, sterile gown, sterile gloves, large sterile drape, hand scrubbing and 2% Chlorhexidine for skin cleaning. The right internal jugular vein was evaluated with ultrasound and shown to be patent. A permanent ultrasound image was obtained and placed in the patient's medical record. Using sterile gel and a sterile probe cover, the right internal jugular vein was entered with a 21 ga needle during real time ultrasound guidance. 21 gauge needle exchanged for a transitional dilator set over 0.018 inch guidewire. Transitional dilator set exchanged for 10 fr sheath over 0.035 inch guidewire. Sheath placed to the infrarenal IVC and CO2 venogram performed to delineate the position of the renal veins. Retrievable Denali IVC filter deployed in the infrarenal location. Sheath removed and hemostasis  achieved with manual compression. IMPRESSION: Retrievable Denali IVC filter placed. PLAN: This IVC filter is potentially retrievable. The patient will be assessed for filter retrieval by Interventional Radiology in approximately 8-12 weeks. Further recommendations regarding filter retrieval, continued surveillance or declaration of device permanence, will be made at that time. Electronically Signed   By: Acquanetta Belling M.D.   On: 05/27/2021 16:38   DG Chest Port 1 View  Result Date: 05/31/2021 CLINICAL DATA:  Dyspnea EXAM: PORTABLE CHEST 1 VIEW COMPARISON:  Portable exam 0912 hours compared to 05/24/2021 FINDINGS: Feeding tube traverses esophagus and visualized stomach. Normal heart size, mediastinal contours, and pulmonary vascularity. Lungs clear. No pulmonary infiltrate, pleural effusion, or pneumothorax. Osseous structures unremarkable. IMPRESSION: No acute abnormalities. Electronically Signed   By: Ulyses Southward M.D.   On: 05/31/2021 10:54   DG CHEST PORT 1 VIEW  Result Date: 05/24/2021 CLINICAL DATA:  Leukocytosis EXAM: PORTABLE CHEST 1 VIEW COMPARISON:  04/28/15 FINDINGS: Cardiac shadow is within normal limits. Mild aortic calcifications are noted. The lungs are well aerated bilaterally with minimal left basilar atelectasis. No bony abnormality is seen. IMPRESSION: Minimal left basilar atelectasis. Electronically Signed   By: Alcide Clever M.D.   On: 05/24/2021 10:52   DG Abd Portable 1V  Result Date: 05/24/2021 CLINICAL DATA:  Feeding tube placement EXAM: PORTABLE ABDOMEN - 1 VIEW COMPARISON:  05/20/2021 FINDINGS: Limited radiograph of the lower chest and upper abdomen was obtained for the purposes of enteric tube localization. Enteric tube is seen coursing below the diaphragm with distal tip terminating within the expected location of the gastric body. Air-filled large and small bowel loops throughout the abdomen, similar to prior. IMPRESSION: Enteric tube tip terminates within the gastric body.  Electronically Signed   By: Duanne Guess D.O.   On: 05/24/2021 13:50   EEG adult  Result Date: 05/23/2021 Charlsie Quest, MD     05/23/2021  4:11 PM Patient Name: Zalen Sequeira MRN: [id number] Epilepsy Attending: Charlsie Quest Referring Physician/Provider: Dr Bing Neighbors Date: 05/23/2021 Duration: 25.36 mins Patient history:  65 y.o. male currently admitted for ICH on 05/11/21 developed somnolence and aphasia with R gaze deviation that resolved with ativan. EEG to evaluate for seizure Level of alertness: asleep AEDs during EEG study: LEV Technical aspects: This EEG study was done with scalp electrodes positioned according to the 10-20 International system of electrode placement. Electrical activity was acquired at a sampling rate of 500Hz  and reviewed with a high frequency filter of 70Hz  and a low frequency filter of 1Hz . EEG data were recorded continuously and digitally stored. Description: No posterior dominant rhythm was seen. Sleep was characterized by sleep spindles (12 to 14 Hz), maximal frontocentral region. EEG showed continuous generalized 3 to 6 Hz theta-delta slowing. There is an excessive amount of 15 to 18 Hz, 2-3 uV beta activity distributed symmetrically and diffusely.  Hyperventilation and photic stimulation were not performed.   ABNORMALITY - Continuous slow, generalized - Excessive beta, generalized IMPRESSION: This study is suggestive of moderate to severe diffuse encephalopathy, nonspecific etiology. The excessive beta activity seen in the background is most likely due to the effect of benzodiazepine and is a benign EEG pattern. No seizures or epileptiform discharges were seen throughout the recording. Priyanka Annabelle Harman   Overnight EEG with video  Result Date: 05/24/2021 Charlsie Quest, MD     05/25/2021  9:42 AM Patient Name: Puneet Masoner MRN: [id number] Epilepsy Attending: Charlsie Quest Referring Physician/Provider: Dr Bing Neighbors Duration: 05/23/2021 1655 to  05/24/2021 1655  Patient history:  64 y.o. male currently admitted for ICH on 05/11/21 developed somnolence and aphasia with R gaze deviation that resolved with ativan. EEG to evaluate for seizure  Level of alertness: awake, asleep  AEDs during EEG study: LEV  Technical aspects: This EEG study was done with scalp electrodes positioned according to the 10-20 International system of electrode placement. Electrical activity was acquired at a sampling rate of 500Hz  and reviewed with a high frequency filter of 70Hz  and a low frequency filter of 1Hz . EEG data were recorded continuously and digitally stored.  Description: No posterior dominant rhythm was seen. Sleep was characterized by sleep spindles (12 to 14 Hz), asymmetry ( R<L) maximal frontocentral region. EEG showed continuous generalized and lateralized right hemisphere 3 to 6 Hz theta-delta slowing. Hyperventilation and photic stimulation were not performed.    ABNORMALITY - Continuous slow, generalized and lateralized right hemisphere - Spindle asymmetry, right<left  IMPRESSION: This study is suggestive of cortical dysfunction in right hemisphere likely secondary to underlying structural abnormality.  Additionally there is moderate to severe diffuse encephalopathy, nonspecific etiology. No seizures or definite epileptiform discharges were seen throughout the recording.   Charlsie Quest   MR MRV HEAD W WO CONTRAST  Result Date: 05/30/2021 CLINICAL DATA:  Right parenchymal hemorrhage.  Abnormal arteriogram. EXAM: MR VENOGRAM HEAD WITHOUT AND  WITH CONTRAST TECHNIQUE: Angiographic images of the intracranial venous structures were acquired using MRV technique without and with intravenous contrast. CONTRAST:  6.23mL GADAVIST GADOBUTROL 1 MMOL/ML IV SOLN COMPARISON:  Cerebral arteriogram 05/28/2021. MR venogram 05/12/2021. FINDINGS: Stable appearance of dominant left transverse sinus. Postcontrast images demonstrate no thrombus. Straight sinus and deep cerebral veins  are intact. Cortical veins are unremarkable. Superior sagittal sinus normal. Sagittal sinus and intra on the jugular veins are normal bilaterally. IMPRESSION: Normal variant MR venogram without significant change. No sinus thrombosis. Electronically Signed   By: Marin Roberts M.D.   On: 05/30/2021 12:49   MR MRV HEAD W WO CONTRAST  Result Date: 05/12/2021 CLINICAL DATA:  Intracranial hemorrhage. EXAM: MR VENOGRAM HEAD WITHOUT AND WITH CONTRAST TECHNIQUE: Angiographic images of the intracranial venous structures were acquired using MRV technique without and with intravenous contrast. CONTRAST:  7mL GADAVIST GADOBUTROL 1 MMOL/ML IV SOLN COMPARISON:  Brain MRI performed earlier today 05/12/2021. Noncontrast head CT examinations 05/11/2021. CT angiogram head/neck 05/11/2021. FINDINGS: The superior sagittal sinus, internal cerebral veins, vein of Galen, straight sinus, transverse sinuses, sigmoid sinuses and visualized jugular veins are patent. No appreciable intracranial venous thrombosis. Hypoplastic right transverse and sigmoid dural venous sinuses. An acute parenchymal hemorrhage within the posterior right frontal lobe is grossly unchanged in size as compared to the brain MRI performed earlier today. There is mild curvilinear vascular enhancement overlying the site of hemorrhage, which may be reactive. Elsewhere, no abnormal intracranial enhancement is identified. IMPRESSION: No evidence of intracranial venous thrombosis. Non dominant right transverse and sigmoid dural venous sinuses. Electronically Signed   By: Jackey Loge DO   On: 05/12/2021 11:27   ECHOCARDIOGRAM COMPLETE  Result Date: 05/11/2021    ECHOCARDIOGRAM REPORT   Patient Name:   [facility] Date of Exam: 05/11/2021 Medical Rec #:  [id number]        Height:       68.0 in Accession #:    [id number]       Weight:       155.2 lb Date of Birth:  1956/11/05         BSA:          1.835 m Patient Age:    65 years         BP:           115/77  mmHg Patient Gender: M                HR:           84 bpm. Exam Location:  Inpatient Procedure: 2D Echo, Cardiac Doppler and Color Doppler Indications:    Stroke I63.9  History:        Patient has no prior history of Echocardiogram examinations.  Sonographer:    Roosvelt Maser RDCS Referring Phys: [id number] Reyne Dumas [facility] IMPRESSIONS  1. Left ventricular ejection fraction, by estimation, is 65 to 70%. The left ventricle has normal function. The left ventricle has no regional wall motion abnormalities. Left ventricular diastolic parameters were normal.  2. Right ventricular systolic function is normal. The right ventricular size is normal. Tricuspid regurgitation signal is inadequate for assessing PA pressure.  3. The mitral valve is normal in structure. No evidence of mitral valve regurgitation.  4. The aortic valve was not well visualized. Aortic valve regurgitation is not visualized. No aortic stenosis is present.  5. The inferior vena cava is normal in size with greater than 50% respiratory variability, suggesting right atrial pressure of 3 mmHg.  FINDINGS  Left Ventricle: Left ventricular ejection fraction, by estimation, is 65 to 70%. The left ventricle has normal function. The left ventricle has no regional wall motion abnormalities. The left ventricular internal cavity size was normal in size. There is  no left ventricular hypertrophy. Left ventricular diastolic parameters were normal. Right Ventricle: The right ventricular size is normal. No increase in right ventricular wall thickness. Right ventricular systolic function is normal. Tricuspid regurgitation signal is inadequate for assessing PA pressure. Left Atrium: Left atrial size was normal in size. Right Atrium: Right atrial size was normal in size. Pericardium: There is no evidence of pericardial effusion. Mitral Valve: The mitral valve is normal in structure. No evidence of mitral valve regurgitation. Tricuspid Valve: The tricuspid valve is normal in  structure. Tricuspid valve regurgitation is not demonstrated. Aortic Valve: The aortic valve was not well visualized. Aortic valve regurgitation is not visualized. No aortic stenosis is present. Aortic valve mean gradient measures 3.0 mmHg. Aortic valve peak gradient measures 6.7 mmHg. Aortic valve area, by VTI measures 2.77 cm. Pulmonic Valve: The pulmonic valve was not well visualized. Pulmonic valve regurgitation is not visualized. Aorta: The aortic root and ascending aorta are structurally normal, with no evidence of dilitation. Venous: The inferior vena cava is normal in size with greater than 50% respiratory variability, suggesting right atrial pressure of 3 mmHg. IAS/Shunts: The interatrial septum was not well visualized.  LEFT VENTRICLE PLAX 2D LVIDd:         4.90 cm  Diastology LVIDs:         2.80 cm  LV e' medial:    8.38 cm/s LV PW:         0.80 cm  LV E/e' medial:  9.0 LV IVS:        0.90 cm  LV e' lateral:   9.14 cm/s LVOT diam:     1.90 cm  LV E/e' lateral: 8.2 LV SV:         69 LV SV Index:   37 LVOT Area:     2.84 cm  RIGHT VENTRICLE RV Basal diam:  2.90 cm LEFT ATRIUM           Index       RIGHT ATRIUM           Index LA diam:      3.10 cm 1.69 cm/m  RA Area:     11.20 cm LA Vol (A2C): 26.8 ml 14.61 ml/m RA Volume:   21.90 ml  11.94 ml/m LA Vol (A4C): 39.2 ml 21.36 ml/m  AORTIC VALVE AV Area (Vmax):    2.64 cm AV Area (Vmean):   2.59 cm AV Area (VTI):     2.77 cm AV Vmax:           129.00 cm/s AV Vmean:          86.700 cm/s AV VTI:            0.248 m AV Peak Grad:      6.7 mmHg AV Mean Grad:      3.0 mmHg LVOT Vmax:         120.00 cm/s LVOT Vmean:        79.200 cm/s LVOT VTI:          0.242 m LVOT/AV VTI ratio: 0.98  AORTA Ao Root diam: 2.50 cm Ao Asc diam:  3.30 cm MITRAL VALVE MV Area (PHT): 3.60 cm     SHUNTS MV Decel Time: 211 msec  Systemic VTI:  0.24 m MV E velocity: 75.30 cm/s   Systemic Diam: 1.90 cm MV A velocity: 107.00 cm/s MV E/A ratio:  0.70 Epifanio Lesches MD  Electronically signed by Epifanio Lesches MD Signature Date/Time: 05/11/2021/5:28:15 PM    Final    CT HEAD CODE STROKE WO CONTRAST  Result Date: 05/23/2021 CLINICAL DATA:  Code stroke.  Acute stroke. EXAM: CT HEAD WITHOUT CONTRAST TECHNIQUE: Contiguous axial images were obtained from the base of the skull through the vertex without intravenous contrast. COMPARISON:  Head CT from 05/11/2021 FINDINGS: Brain: 5.5 x 2.8 cm hematoma in the subcortical high right frontal region with extensive vasogenic edema in the adjacent brain that is progressed. There is worsening local mass effect with midline shift now measuring 13 mm. No entrapment. No cytotoxic edema seen. Vascular: No hyperdense vessel or unexpected calcification. Skull: Normal. Negative for fracture or focal lesion. Sinuses/Orbits: Negative Other: Critical Value/emergent results were called by telephone at the time of interpretation on 05/23/2021 at 9:10 am to provider [facility] , who verbally acknowledged these results. ASPECTS [facility] Stroke Program Early CT Score) Not scored in this setting IMPRESSION: Progressed right frontal hematoma and vasogenic edema with midline shift now measuring 13 mm. Electronically Signed   By: Marnee Spring M.D.   On: 05/23/2021 09:12   CT HEAD CODE STROKE WO CONTRAST  Result Date: 05/11/2021 CLINICAL DATA:  Code stroke. Left-sided weakness, nausea, and vomiting. EXAM: CT HEAD WITHOUT CONTRAST TECHNIQUE: Contiguous axial images were obtained from the base of the skull through the vertex without intravenous contrast. COMPARISON:  None. FINDINGS: Brain: An acute parenchymal hemorrhage in the high posterior right frontal lobe measures 4.2 x 2.3 x 3.1 cm (approximate volume of 15 mL). There is mild surrounding edema without midline shift, and there is a small amount of adjacent subarachnoid hemorrhage. A very small amount of coexistent subdural hemorrhage is also not excluded in this region. No acute infarct is  identified elsewhere. The ventricles are normal in size. Hypodensities in the cerebral white matter bilaterally are nonspecific but compatible with mild chronic small vessel ischemic disease. Vascular: No hyperdense vessel. Skull: No fracture or suspicious osseous lesion. Sinuses/Orbits: Small osteoma in the left frontal sinus. Mild right and moderate left maxillary sinus mucosal thickening. Clear mastoid air cells. Unremarkable orbits. Other: None. ASPECTS [facility] Stroke Program Early CT Score) Not scored in the presence of acute hemorrhage. IMPRESSION: Acute parenchymal hemorrhage in the posterior right frontal lobe with small amount of adjacent subarachnoid hemorrhage. These results were communicated to Dr. Iver Nestle at 9:08 am on 05/11/2021 by text page via the [facility] messaging system. Electronically Signed   By: Sebastian Ache M.D.   On: 05/11/2021 09:12   VAS [place] LOWER EXTREMITY VENOUS (DVT)  Result Date: 05/26/2021  Lower Venous DVT Study Patient Name:  DOYE MONTILLA  Date of Exam:   05/26/2021 Medical Rec #: [id number]         Accession #:    [id number] Date of Birth: 09/15/1956          Patient Gender: M Patient Age:   65Y Exam Location:  [facility] Procedure:      VAS [place] LOWER EXTREMITY VENOUS (DVT) Referring Phys: [id number] Eliezer Champagne --------------------------------------------------------------------------------  Indications: Fever.  Limitations: Poor ultrasound/tissue interface. Comparison Study: No previous exams. Performing Technologist: Ernestene Mention RVT, RDMS  Examination Guidelines: A complete evaluation includes B-mode imaging, spectral Doppler, color Doppler, and power Doppler as needed of all accessible portions of each vessel.  Bilateral testing is considered an integral part of a complete examination. Limited examinations for reoccurring indications may be performed as noted. The reflux portion of the exam is performed with the patient in reverse Trendelenburg.   +---------+---------------+---------+-----------+----------+--------------+ RIGHT    CompressibilityPhasicitySpontaneityPropertiesThrombus Aging +---------+---------------+---------+-----------+----------+--------------+ CFV      Full           Yes      Yes                                 +---------+---------------+---------+-----------+----------+--------------+ SFJ      Full                                                        +---------+---------------+---------+-----------+----------+--------------+ FV Prox  Full           Yes      Yes                                 +---------+---------------+---------+-----------+----------+--------------+ FV Mid   Full           Yes      Yes                                 +---------+---------------+---------+-----------+----------+--------------+ FV DistalFull           Yes      Yes                                 +---------+---------------+---------+-----------+----------+--------------+ PFV      Full                                                        +---------+---------------+---------+-----------+----------+--------------+ POP      Full           Yes      Yes                                 +---------+---------------+---------+-----------+----------+--------------+ PTV      Full                                                        +---------+---------------+---------+-----------+----------+--------------+ PERO     Full                                                        +---------+---------------+---------+-----------+----------+--------------+   +---------+---------------+---------+-----------+----------+-------------------+ LEFT     CompressibilityPhasicitySpontaneityPropertiesThrombus Aging      +---------+---------------+---------+-----------+----------+-------------------+ CFV      Partial  Yes      Yes                  Acute- non                                                                 occlusive           +---------+---------------+---------+-----------+----------+-------------------+ SFJ      Full                                                             +---------+---------------+---------+-----------+----------+-------------------+ FV Prox  Full           Yes      Yes                                      +---------+---------------+---------+-----------+----------+-------------------+ FV Mid   Full           Yes      Yes                                      +---------+---------------+---------+-----------+----------+-------------------+ FV DistalFull           Yes      Yes                                      +---------+---------------+---------+-----------+----------+-------------------+ PFV      Full                                                             +---------+---------------+---------+-----------+----------+-------------------+ POP      Full           Yes      Yes                                      +---------+---------------+---------+-----------+----------+-------------------+ PTV                                                   Not well visualized +---------+---------------+---------+-----------+----------+-------------------+ PERO     Full                                                             +---------+---------------+---------+-----------+----------+-------------------+ EIV  Yes      Yes                  Patent by color and                                                       doppler             +---------+---------------+---------+-----------+----------+-------------------+     Summary: BILATERAL: - No evidence of superficial venous thrombosis in the lower extremities, bilaterally. -No evidence of popliteal cyst, bilaterally. RIGHT: - There is no evidence of deep vein thrombosis in the lower extremity.  LEFT: - Findings consistent with acute  deep vein thrombosis involving the left common femoral vein.  *See table(s) above for measurements and observations. Electronically signed by Coral Else MD on 05/26/2021 at 7:50:00 PM.    Final    CT ANGIO HEAD NECK W WO CM (CODE STROKE)  Result Date: 05/23/2021 CLINICAL DATA:  Enlarging hematoma EXAM: CT ANGIOGRAPHY HEAD AND NECK TECHNIQUE: Multidetector CT imaging of the head and neck was performed using the standard protocol during bolus administration of intravenous contrast. Multiplanar CT image reconstructions and MIPs were obtained to evaluate the vascular anatomy. Carotid stenosis measurements (when applicable) are obtained utilizing NASCET criteria, using the distal internal carotid diameter as the denominator. CONTRAST:  50mL OMNIPAQUE IOHEXOL 350 MG/ML SOLN COMPARISON:  CTA 05/11/2021 FINDINGS: CTA NECK FINDINGS Aortic arch: Normal Right carotid system: Widely patent vessels with smooth contour. No atheromatous changes Left carotid system: Widely patent vessels with smooth contour. No noted atheromatous changes Vertebral arteries: No proximal subclavian stenosis. The vertebral arteries are widely patent. No suspected vertebral beading when allowing for streak artifact. Skeleton: Multilevel degenerative disc narrowing and ridging canal and foraminal encroachment the lower cervical levels. Other neck: No evidence of inflammation or mass. Upper chest: Negative Review of the MIP images confirms the above findings CTA HEAD FINDINGS Anterior circulation: Major vessels are smooth and widely patent. No branch occlusion, generalized beading, or aneurysm. There is a cortical branch at the right frontal parietal convexity which is not continuous beyond the level of the upper and lateral hematoma. No visible nidus or early enhancing veins. Posterior circulation: Vertebral and basilar arteries are smooth and widely patent. No branch occlusion, beading, or aneurysm. Venous sinuses: MRV was obtained previously.  The veins are largely nonenhancing on this study. Anatomic variants: None significant Review of the MIP images confirms the above findings IMPRESSION: Seemingly truncated arterial cortical branch at the upper and lateral hematoma, without discrete vascular malformation or spot sign. Recommend follow-up after resolution of mass effect to evaluate for small AVM. Electronically Signed   By: Marnee Spring M.D.   On: 05/23/2021 09:54   CT ANGIO HEAD NECK W WO CM (CODE STROKE)  Result Date: 05/11/2021 CLINICAL DATA:  Stroke follow-up.  Left-sided weakness. EXAM: CT ANGIOGRAPHY HEAD AND NECK TECHNIQUE: Multidetector CT imaging of the head and neck was performed using the standard protocol during bolus administration of intravenous contrast. Multiplanar CT image reconstructions and MIPs were obtained to evaluate the vascular anatomy. Carotid stenosis measurements (when applicable) are obtained utilizing NASCET criteria, using the distal internal carotid diameter as the denominator. CONTRAST:  50mL OMNIPAQUE IOHEXOL 350 MG/ML SOLN COMPARISON:  Same day CT head. FINDINGS: CTA NECK FINDINGS Aortic arch: Great vessel origins are  patent. Right carotid system: No evidence of dissection, stenosis (50% or greater) or occlusion. Mild apparent irregularity of the ICA at the skull base in a region of streak artifact. Left carotid system: No evidence of dissection, stenosis (50% or greater) or occlusion. Mild apparent irregularity of the ICA at the skull base in a region of streak artifact. Vertebral arteries: Codominant. No evidence of dissection, stenosis (50% or greater) or occlusion. Skeleton: Moderate degenerative disc disease at at C5-C6 and C6-C7 with disc height loss, endplate sclerosis and posterior disc osteophyte complexes. Other neck: No acute abnormality Upper chest: Visualized lung apices are clear. Review of the MIP images confirms the above findings CTA HEAD FINDINGS Anterior circulation: No large vessel occlusion  or proximal hemodynamically significant stenosis. Evaluation of the distal vasculature is limited due to venous contamination. No evidence of and arteriovenous malformation or aneurysm in the region of intraparenchymal hemorrhage, although acute blood products limit evaluation. Posterior circulation: No large vessel occlusion or proximal hemodynamically significant stenosis. Venous sinuses: The superior sagittal sinus is narrowed in the region of hemorrhage, most likely from mass effect. Small right transverse and sigmoid sinuses. Review of the MIP images confirms the above findings IMPRESSION: CTA Head: 1. No large vessel occlusion or hemodynamically significant proximal arterial stenosis. 2. The superior sagittal sinus is narrowed in the region of hemorrhage with poorly visualized draining cortical veins in this region. While indeterminate, this may be secondary to mass effect given no definite/clear intraluminal thrombus. Given these findings and the location of hemorrhage; however, recommend low threshold for follow-up CTV or MRI with contrast to ensure stability and exclude worsening thrombosis. 3. No evidence of and arteriovenous malformation or aneurysm in the region of intraparenchymal hemorrhage, although acute blood products limit evaluation. Follow-up imaging after resolution of hemorrhage could provide further assessment if clinically indicated. CTA Neck: 1. No significant (greater than 50%) stenosis. 2. Mild apparent irregularity of bilateral ICAs at the skull base is favored artifactual given streak artifact from dental amalgam in this region. Fibromuscular dysplasia is a differential consideration. Findings and recommendations discussed with Dr. Iver Nestle via telephone at 9:59 a.m. Electronically Signed   By: Feliberto Harts MD   On: 05/11/2021 10:09   IR ANGIO INTRA EXTRACRAN SEL COM CAROTID INNOMINATE BILAT MOD SED  Result Date: 05/31/2021 CLINICAL DATA:  Right sided intra cerebral hemorrhage x2  with mass-effect. EXAM: BILATERAL COMMON CAROTID AND INNOMINATE ANGIOGRAPHY COMPARISON:  CT angiogram of the head and neck of May 23, 2021, and May 11, 2021. MEDICATIONS: Heparin none. No antibiotic was administered within 1 hour of the procedure. ANESTHESIA/SEDATION: Versed 1.5 mg IV; Fentanyl 37.5 mcg IV Moderate Sedation Time:  52 minutes The patient was continuously monitored during the procedure by the interventional radiology nurse under my direct supervision. CONTRAST:  Isovue 300 approximately 80 mL. FLUOROSCOPY TIME:  Fluoroscopy Time: 15 minutes 24 seconds (1503 mGy). COMPLICATIONS: None immediate. TECHNIQUE: Informed written consent was obtained from the patient after a thorough discussion of the procedural risks, benefits and alternatives. All questions were addressed. Maximal Sterile Barrier Technique was utilized including caps, mask, sterile gowns, sterile gloves, sterile drape, hand hygiene and skin antiseptic. A timeout was performed prior to the initiation of the procedure. The right groin was prepped and draped in the usual sterile fashion. Thereafter using modified Seldinger technique, transfemoral access into the right common femoral artery was obtained without difficulty. Over a 0.035 inch guidewire, a 5 French Pinnacle sheath was inserted. Through this, and also over 0.035 inch guidewire,  a 5 [place] JB 1 catheter was advanced to the aortic arch region and selectively positioned in the right common carotid artery, the right vertebral artery, the left common carotid artery and the left vertebral artery. FINDINGS: The right vertebral artery origin is widely patent. The vessel is seen to opacify to the cranial skull base. Wide opacification is seen of right vertebrobasilar junction and the right posterior-inferior cerebellar artery. The basilar artery, the posterior cerebral arteries, the superior cerebellar arteries and the anterior-inferior cerebellar arteries opacify into the capillary and  venous phases. Venous phase demonstrates non opacification of the right transverse sinus extending from the right lateral origin of the torcula to the transverse sigmoid junction. Retrograde opacification of the sigmoid sinus is seen from the superior cerebellar veins with retrograde flow into the right transverse sinus sigmoid junction. The right common carotid arteriogram demonstrates the right external carotid artery and its major branches to be widely patent. The right internal carotid artery at the bulb to the cranial skull base is widely patent. The petrous, the cavernous and the supraclinoid segments are widely patent. The right middle cerebral artery is patent into the anterior temporal branch and the bifurcation branches. Bundling of the right middle cerebral artery M3 M4 branch is noted secondary to the superiorly and the medially located intracranial hemorrhage. The right anterior cerebral artery opacifies into the capillary and venous phases. The venous phase demonstrates a minimally opacified right transverse sinus. The delayed lateral projection of the DSA demonstrates delayed clearance of the arterial portion of the contrast compared to the venous. The left common carotid arteriogram demonstrates the left external carotid artery and its major branches to be widely patent. The left internal carotid artery at the bulb to the cranial skull base is widely patent. The petrous, the cavernous and the supraclinoid segments are widely patent. The left middle cerebral and the left anterior cerebral artery opacify into the capillary and venous phases. Left vertebral origin is widely patent. The vessel is seen to opacify to the cranial skull base. The left vertebrobasilar junction and the left posterior-inferior cerebral artery demonstrate wide patency. The basilar artery, and the opacified portions of the posterior cerebral arteries, the superior cerebellar arteries and the anterior-inferior cerebellar arteries  is seen into the capillary and the venous phases. The venous phase again demonstrates non opacification of the right transverse sinus with suggestion of small dural veins. Again demonstrated is the retrograde opacification of the right sigmoid sinus transverse sinus junction from the ipsilateral superior cerebellar venous structures. IMPRESSION: Non opacification of the right transverse sinus extending from the torcula to the right transverse sinus/sigmoid sinus junction with retrograde opacification of the right sigmoid sinus from the superior cerebellar veins on the on the right side. Above findings supportive of venous hypertension with delayed clearance of contrast from the right cerebral hemisphere, and also with retrograde opacification of the right sigmoid sinus from the right superior cerebellar hemispheric veins. Attenuated caliber of the right sigmoid sinus noted though the right internal jugular vein appears comparable in size compared to the contralateral left side. Angiographically no evidence of intracranial aneurysm arteriovenous shunting, or of arteriovenous malformation or dissections. PLAN: Follow-up MRA of the brain, and MRV of the dural venous sinuses with and without contrast in 3 months. Electronically Signed   By: Julieanne Cotton M.D.   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 EXAM:  BILATERAL COMMON CAROTID AND INNOMINATE ANGIOGRAPHY COMPARISON:  CT angiogram of the head and neck of May 23, 2021, and May 11, 2021. MEDICATIONS: Heparin none. No antibiotic was administered within 1 hour of the procedure. ANESTHESIA/SEDATION: Versed 1.5 mg IV; Fentanyl 37.5 mcg IV Moderate Sedation Time:  52 minutes The patient was continuously monitored during the procedure by the interventional radiology nurse under my direct supervision. CONTRAST:  Isovue 300 approximately 80  mL. FLUOROSCOPY TIME:  Fluoroscopy Time: 15 minutes 24 seconds (1503 mGy). COMPLICATIONS: None immediate. TECHNIQUE: Informed written consent was obtained from the patient after a thorough discussion of the procedural risks, benefits and alternatives. All questions were addressed. Maximal Sterile Barrier Technique was utilized including caps, mask, sterile gowns, sterile gloves, sterile drape, hand hygiene and skin antiseptic. A timeout was performed prior to the initiation of the procedure. The right groin was prepped and draped in the usual sterile fashion. Thereafter using modified Seldinger technique, transfemoral access into the right common femoral artery was obtained without difficulty. Over a 0.035 inch guidewire, a 5 French Pinnacle sheath was inserted. Through this, and also over 0.035 inch guidewire, a 5 [place] JB 1 catheter was advanced to the aortic arch region and selectively positioned in the right common carotid artery, the right vertebral artery, the left common carotid artery and the left vertebral artery. FINDINGS: The right vertebral artery origin is widely patent. The vessel is seen to opacify to the cranial skull base. Wide opacification is seen of right vertebrobasilar junction and the right posterior-inferior cerebellar artery. The basilar artery, the posterior cerebral arteries, the superior cerebellar arteries and the anterior-inferior cerebellar arteries opacify into the capillary and venous phases. Venous phase demonstrates non opacification of the right transverse sinus extending from the right lateral origin of the torcula to the transverse sigmoid junction. Retrograde opacification of the sigmoid sinus is seen from the superior cerebellar veins with retrograde flow into the right transverse sinus sigmoid junction. The right common carotid arteriogram demonstrates the right external carotid artery and its major branches to be widely patent. The right internal carotid artery at the bulb  to the cranial skull base is widely patent. The petrous, the cavernous and the supraclinoid segments are widely patent. The right middle cerebral artery is patent into the anterior temporal branch and the bifurcation branches. Bundling of the right middle cerebral artery M3 M4 branch is noted secondary to the superiorly and the medially located intracranial hemorrhage. The right anterior cerebral artery opacifies into the capillary and venous phases. The venous phase demonstrates a minimally opacified right transverse sinus. The delayed lateral projection of the DSA demonstrates delayed clearance of the arterial portion of the contrast compared to the venous. The left common carotid arteriogram demonstrates the left external carotid artery and its major branches to be widely patent. The left internal carotid artery at the bulb to the cranial skull base is widely patent. The petrous, the cavernous and the supraclinoid segments are widely patent. The left middle cerebral and the left anterior cerebral artery opacify into the capillary and venous phases. Left vertebral origin is widely patent. The vessel is seen to opacify to the cranial skull base. The left vertebrobasilar junction and the left posterior-inferior cerebral artery demonstrate wide patency. The basilar artery, and the opacified portions of the posterior cerebral arteries, the superior cerebellar arteries and the anterior-inferior cerebellar arteries is seen into the capillary and the venous phases. The venous phase again demonstrates non opacification of the right transverse sinus with suggestion of  small dural veins. Again demonstrated is the retrograde opacification of the right sigmoid sinus transverse sinus junction from the ipsilateral superior cerebellar venous structures. IMPRESSION: Non opacification of the right transverse sinus extending from the torcula to the right transverse sinus/sigmoid sinus junction with retrograde opacification of the  right sigmoid sinus from the superior cerebellar veins on the on the right side. Above findings supportive of venous hypertension with delayed clearance of contrast from the right cerebral hemisphere, and also with retrograde opacification of the right sigmoid sinus from the right superior cerebellar hemispheric veins. Attenuated caliber of the right sigmoid sinus noted though the right internal jugular vein appears comparable in size compared to the contralateral left side. Angiographically no evidence of intracranial aneurysm arteriovenous shunting, or of arteriovenous malformation or dissections. PLAN: Follow-up MRA of the brain, and MRV of the dural venous sinuses with and without contrast in 3 months. Electronically Signed   By: Julieanne Cotton M.D.   On: 05/28/2021 16:01     PHYSICAL EXAM  Temp:  [97.6 F (36.4 C)-100.5 F (38.1 C)] 100.5 F (38.1 C) (06/21 1200) Pulse Rate:  [107-110] 110 (06/21 1200) Resp:  [18-20] 20 (06/21 1200) BP: (111-134)/(74-88) 132/78 (06/21 1200) SpO2:  [92 %-98 %] 95 % (06/21 1200) Weight:  [76 kg] 67 kg (06/21 0451)  General - Well nourished, well developed, middle-aged African-American male sleepy but arousable.  Ophthalmologic - fundi not visualized due to noncooperation.  Cardiovascular - Regular rhythm and mild tachycardia.  Neuro - sleepy but eyes open with voice, he was able to follow peripheral commands on the right hand and most central commands.  Moderate dysarthria, oriented to place and people age but not to time. Right gaze preference, but unable to cross midline. Not blinking to visual threat left greater than right. Left facial droop. Tongue protrusion not cooperative. Right UE spontaneous movement against gravity and RLE some spontaneous movement but not against gravity, LUE and LLE plegic with increased muscle tone. Sensation, coordination not cooperative and gait not tested.   ASSESSMENT/PLAN Mr. Claiborne Stroble is a 65 y.o. male with  history of dementia with behavioral disturbance and OSA on CPAP admitted for right gaze and left hemoplegia, left facial droop. 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 He was stabilized and sent to CIR. 6/12 found to be lethargic and drowsy, CT repeat showed increased cerebral edema and midline shift. EEG no seizure s/p ativan and keppra. He was transfer back to ICU for further management.   Encephalopathy  Improving Likely combination of cerebral edema, ? Seiziure, fever, leukocytosis and AKI Treat underlying condition as below Neuro check closely  Cerebral edema  CT head 6/12 showed progression of right frontal ICH and cerebral edema with midline shift CTA head and neck 6/12 no discrete AVM or spot sign CT repeat 6/16 unchanged, no progression of midline shift or hematoma. On 3% saline 50->65->30 cc/h->off Na Q6h Na 135->140->144->151->158->155->156->157>158 Na goal 145-155  ? 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 No CSVT on venous phase CT repeat mildly increased left frontal ICH 05/12/21 MRI brain no significant change of ICH since last CT MRV Non dominant  right transverse and sigmoid dural venous sinuses. CT repeat 6/16 unchanged, no progression of midline shift or hematoma. Cerebral angiogram no AVM or aneurysm, concerning for Poor to no sig opacification of RT transverse sinus in its entirety, ?Suspicious for thrombotic occlusion. 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  No evidence of clot on venous sinus thrombosis.   2D Echo  EF 65-70% LDL 140 HgbA1c 5.8 UDS neg Heparin  subq for VTE prophylaxis No antithrombotic prior to admission, now on No antithrombotic given ICH Ongoing aggressive stroke risk factor management Therapy recommendations:  pending Disposition:  Pending   ?? 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  DVT LE venous Doppler showed left common femoral vein DVT Not a candidate for anticoagulation at this time S/p IVC filter 6/16  Dementia  Has appointment with [facility] for LP and evaluation of dementia, but pt not able to go due to onset of ICH Delirium precaution Continue outpt follow up with [facility]  Hyperlipidemia Home meds:  none  LDL 140, goal < 70 Will consider low dose statin (lipitor 20) on discharge   Dysphagia  Speech to follow NPO  S/p cortrak on TF @ 50  Fever and leukocytosis Tmax 101.4->102.6->100.9->102.4->101.7->afebrile Leukocytosis WBC 7.6->12.4 ->8.1->7.8->22.9->20.4->24.8-> 23.9->17.7 CCM on board Continue monitoring UA neg CXR Minimal left basilar atelectasis On Rocephin now  Other Stroke Risk Factors Advanced age Obstructive sleep apnea, on CPAP at home  Other Active Problems AKI Cre 1.20->1.45->1.03->1.02->0.98->1.09 - on IVF and TF Sleepiness and lethargy-Keppra DC'd 06/01/2021 and amantadine added  Hospital day # 9 Patient continues to show some progress.  But remains sleepy.  Plan to discontinue Keppra and try some amantadine to  promote wakefulness.   Continue ongoing therapies and continue mobilization out of bed as tolerated  .  Long discussion with patient and wife at the bedside and brother over the phone answered questions.  Discussed with Dr. Ella Jubilee.  May need to consider PEG tube if dysphagia does not improve soon.  Greater than 50% time during this 25-minute visit was spent on counseling and coordination of care about his dysphagia and stroke and answering questions.  Delia Heady, MD   06/01/2021 2:53 PM    To contact Stroke Continuity provider, please refer to [email or web address]. After hours, contact General Neurology

## 2021-06-01 NOTE — Progress Notes (Addendum)
PROGRESS NOTE    Donald Donald [name]  PVV:[id number] DOB: Feb 01, 1956 DOA: 05/23/2021 PCP: [name], MD    Brief Narrative:  Donald Donald [name] was admitted to the hospital with the working diagnosis of acute worsening intracranial hemorrhage of the right frontal lobe.    65 year old Donald [name] with recent intracranial hemorrhage who presents from inpatient rehab due to altered mentation and worsening focal neurologic deficits.  Originally admitted to the hospital May 31 with intracranial hemorrhage of the posterior right frontal lobe.  He was transferred to inpatient rehab for further care.  While at the rehab he had acute change in his mentation with difficulty swallowing and facial droop.  Stat head CT showed right intraparenchymal hemorrhage with worsening midline shift.  He was transported to neuro ICU for further evaluation.  His blood pressure was 135/77, temperature 97.5, respiratory rate 18, oxygen saturation 100%, his lungs are clear to auscultation bilaterally, heart S1-S2, present, rhythmic, soft abdomen, no lower extremity edema.  Patient was responsive to noxious stimuli but not verbal or touch.  Not following commands.   Patient admitted to the neuro ICU, for further close neurologic monitoring.  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  Patient was placed on Keppra for seizure prophylaxis.  Medical treatment with hypertonic saline for intracranial hypertension.   He was found to have a right femoral DVT and an IVC filter was placed (06/16).    Plan to remove IVC filter when reduced bleeding risk and anticoagulation can be started. Family aware that after prolonged time filter can be source of further blood clots    He was diagnosed with left lower lobe pneumonia (not present on admission) and received antibiotic therapy with ceftriaxone.  Transferred to progressive care, continue to have severe swallow dysfunction. Has NG tube for  nutrition.   Has been very somnolent and hyporeactive.    Assessment & Plan:   Principal Problem:   Intracerebral hemorrhage Active Problems:   Hypokalemia   Malnutrition of moderate degree   Seizure (HCC)   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HCC)   Right lower lobe pneumonia   Prediabetes   Hyperglycemia     Intracranial hemorrhage right frontal lobe, complicated with toxic and metabolic acute encephalopathy.  Patient continue to be very somnolent and hyporeactive. Continue to have left sided hemiparesis and severe wallow dysfunction. Tolerating well tube feedings.    Keppra has been discontinued and now placed on amantadine per neurology recommendations. Continue PT/OT and neuro checks. Possible transfer to CIR when more awake.  If no improvement in swallow, will recommend IR consultation for PEG tube placement.    2. Left femoral DVT. Not candidate for full anticoagulation. He had a IVC filter placed on 05/27/21. Discussed with his family at the bedside, will plan to continue holding prophylactic anticoagulation for now.  Plan to resume enoxaparin in the next 7 days, to prevent further DVT.    3. Left lower lobe pneumonia. Patient has completed therapy with ceftriaxone. Chest film 06/20 ruled out recurrent aspiration. Positive fever today. Wbc is 12,8  Continue aspiration precautions and tube feedings. Oxymetry monitoring and deep suction as needed.  Holding antibiotic therapy, check cell count in am and continue close temperature curve monitoring.    4. Pre-diabetes Hgb A1c 5,8 complicated with hyperglycemia. Moderate calorie protein malnutrition. Fasting glucose is 213, capillary 195-156- 195.   Will add basal insulin with 5 units glargine and continue with insulin sliding scale for glucose cover and  monitoring.   Continue with tube feedings.     5. Hypokalemia/ hypernatremia/ hyperchloremia and contraction alkalosis.  Tolerating well increased free water flushes, Na is down  to 147, K is 4,4 and bicarbonate at 29.  Continue with tube feedings and free water flushes,      Patient continue to be at high risk for worsening encephalopathy   Status is: Inpatient  Remains inpatient appropriate because:Inpatient level of care appropriate due to severity of illness  Dispo: The patient is from: Home              Anticipated d/c is to: CIR              Patient currently is not medically stable to d/c.   Difficult to place patient No       DVT prophylaxis: Ivc filter   Code Status:   full  Family Communication:  No family at the bedside      Nutrition Status: Nutrition Problem: Moderate Malnutrition Etiology: chronic illness Signs/Symptoms: mild fat depletion, mild muscle depletion, moderate muscle depletion Interventions: Tube feeding      Consultants:  Neurology      Subjective: Patient is very somnolent and hyporeactive, eyes closed and not following commands.,   Objective: Vitals:   06/01/21 0400 06/01/21 0451 06/01/21 0845 06/01/21 1200  BP: 125/83  111/74 132/78  Pulse:   (!) 107 (!) 110  Resp:   19 20  Temp: 98.7 F (37.1 C)  97.6 F (36.4 C) (!) 100.5 F (38.1 C)  TempSrc: Oral  Axillary Axillary  SpO2:   92% 95%  Weight:  67 kg      Intake/Output Summary (Last 24 hours) at 06/01/2021 1508 Last data filed at 06/01/2021 0019 Gross per 24 hour  Intake --  Output 1800 ml  Net -1800 ml   Filed Weights   05/29/21 0400 05/31/21 0459 06/01/21 0451  Weight: 65.6 kg 66.1 kg 67 kg    Examination:   General: deconditioned and ill looking appearing  Neurology: somnolent and hyporeactive, not following commands or opening eyes. Left sided hemiparesis.  E ENT: no pallor, no icterus, oral mucosa moist. NG tube in place.  Cardiovascular: No JVD. S1-S2 present, rhythmic, no gallops, rubs, or murmurs. No lower extremity edema. Pulmonary: positive breath sounds bilaterally, adequate air movement, no wheezing, rhonchi or  rales. Gastrointestinal. Abdomen soft and non tender Skin. No rashes Musculoskeletal: no joint deformities     Data Reviewed: I have personally reviewed following labs and imaging studies  CBC: Recent Labs  Lab 05/26/21 0335 05/27/21 0300 05/28/21 0450 05/29/21 0617 05/31/21 0449  WBC 24.8* 23.9* 17.7* 12.9* 12.8*  NEUTROABS  --   --   --   --  10.0*  HGB 14.0 14.3 15.6 13.5 12.9*  HCT 43.9 44.6 49.7 43.8 41.9  MCV 94.4 93.3 95.4 95.4 96.8  PLT 360 425* 492* 434* 376   Basic Metabolic Panel: Recent Labs  Lab 05/27/21 0300 05/27/21 1637 05/28/21 0450 05/28/21 1636 05/29/21 0617 05/29/21 1655 05/30/21 0543 05/31/21 0449 06/01/21 0316  NA 156*   < > 157*   < > 158* 160* 156* 152* 147*  K 4.2  --  3.8  --  3.6  --   --  4.4 4.4  CL 119*  --  114*  --  124*  --   --  112* 114*  CO2 30  --  31  --  27  --   --  32 29  GLUCOSE 93  --  134*  --  250*  --   --  210* 213*  BUN 25*  --  26*  --  25*  --   --  20 24*  CREATININE 0.98  --  1.09  --  0.98  --   --  1.09 0.96  CALCIUM 9.8  --  10.4*  --  9.4  --   --  9.1 9.0   < > = values in this interval not displayed.   GFR: Estimated Creatinine Clearance: 67.6 mL/min (by C-G formula based on SCr of 0.96 mg/dL). Liver Function Tests: No results for input(s): AST, ALT, ALKPHOS, BILITOT, PROT, ALBUMIN in the last 168 hours. No results for input(s): LIPASE, AMYLASE in the last 168 hours. No results for input(s): AMMONIA in the last 168 hours. Coagulation Profile: No results for input(s): INR, PROTIME in the last 168 hours. Cardiac Enzymes: No results for input(s): CKTOTAL, CKMB, CKMBINDEX, TROPONINI in the last 168 hours. BNP (last 3 results) No results for input(s): PROBNP in the last 8760 hours. HbA1C: No results for input(s): HGBA1C in the last 72 hours. CBG: Recent Labs  Lab 05/31/21 2051 06/01/21 0012 06/01/21 0420 06/01/21 0843 06/01/21 1204  GLUCAP 186* 208* 195* 156* 195*   Lipid Profile: No results  for input(s): CHOL, HDL, LDLCALC, TRIG, CHOLHDL, LDLDIRECT in the last 72 hours. Thyroid Function Tests: No results for input(s): TSH, T4TOTAL, FREET4, T3FREE, THYROIDAB in the last 72 hours. Anemia Panel: No results for input(s): VITAMINB12, FOLATE, FERRITIN, TIBC, IRON, RETICCTPCT in the last 72 hours.    Radiology Studies: I have reviewed all of the imaging during this hospital visit personally     Scheduled Meds:  amantadine  100 mg Oral BID   chlorhexidine  15 mL Mouth Rinse BID   Chlorhexidine Gluconate Cloth  6 each Topical Daily   feeding supplement (PROSource TF)  45 mL Per Tube Daily   free water  250 mL Per Tube Q4H   insulin aspart  0-9 Units Subcutaneous Q4H   mouth rinse  15 mL Mouth Rinse q12n4p   Continuous Infusions:  feeding supplement (OSMOLITE 1.5 CAL) 1,000 mL (05/31/21 1441)     LOS: 9 days        Ousmane Seeman Annett Gula, MD

## 2021-06-01 NOTE — Progress Notes (Signed)
Occupational Therapy Treatment Patient Details Name: Donald [name] MRN: [id number] DOB: 1956-07-26 Today's Date: 06/01/2021    History of present illness The pt is a 65 yo male re-admitted from CIR on 6/12 due to sudden onset of worsening mental status, seizures, and facial droop. Repeat CT revealed enlargement of original R frontal ICH with worsening midline shift. Significant PMH includes: memory deficits, OSA..   OT comments  Pt limited by lethargy this session, suspect d/t meds. Despite significant lethargy, pt able to tolerate sitting EOB mostly at mod A +2 for safety/physical assist, though varyied between min to max A. Sat EOB several minutes, eyes closed and responding only with occasional grunt. Spouse present throughout session. D/c plan remains appropriate.    Follow Up Recommendations  CIR    Equipment Recommendations  Other (comment) (TBD)    Recommendations for Other Services      Precautions / Restrictions Precautions Precautions: Fall;Other (comment) Precaution Comments: L hemiparesis, L inattention, pusher to the L, cognitive deficits, cortrak Restrictions Weight Bearing Restrictions: No       Mobility Bed Mobility Overal bed mobility: Needs Assistance Bed Mobility: Supine to Sit;Sit to Supine     Supine to sit: Total assist;+2 for physical assistance;HOB elevated Sit to supine: Total assist;+2 for physical assistance;HOB elevated   General bed mobility comments: lethargic. total A for all aspects.    Transfers                 General transfer comment: unable to safely attempt this session    Balance Overall balance assessment: Needs assistance Sitting-balance support: Bilateral upper extremity supported;Single extremity supported;Feet supported Sitting balance-Leahy Scale: Poor Sitting balance - Comments: Pusher to the L                                   ADL either performed or assessed with clinical judgement   ADL  Overall ADL's : Needs assistance/impaired Eating/Feeding: NPO Eating/Feeding Details (indicate cue type and reason): cortrak Grooming: Total assistance                                 General ADL Comments: Pt lethargic throuhghout session, suspect due to meds. Pt sat EOB several minutes at mostly mod A, varing at times min - max A. Pillow under each forearm for support. Pt with difficulty coming out of neck flexion.     Vision   Vision Assessment?: Vision impaired- to be further tested in functional context Additional Comments: difficult to fully assess d/t lethargy. With eye lids supported open, Blinks to confrontation with R eye but not left.   Perception     Praxis      Cognition Arousal/Alertness: Lethargic;Suspect due to medications Behavior During Therapy: Flat affect Overall Cognitive Status: Difficult to assess                                          Exercises     Shoulder Instructions       General Comments HR 120-123 sitting EOB    Pertinent Vitals/ Pain       Pain Assessment: Faces Faces Pain Scale: No hurt  Home Living  Prior Functioning/Environment              Frequency  Min 2X/week        Progress Toward Goals  OT Goals(current goals can now be found in the care plan section)  Progress towards OT goals: Not progressing toward goals - comment (lethargic suspect d/t meds. Neurologist present for part of session, plans to adjust meds to improve alertness.)  Acute Rehab OT Goals Patient Stated Goal: spouse wants pt to return to CIR OT Goal Formulation: With family Time For Goal Achievement: 06/12/21 Potential to Achieve Goals: Good ADL Goals Pt Will Perform Eating: with adaptive utensils;sitting;bed level;with mod assist Pt Will Perform Grooming: with mod assist;sitting;bed level Pt Will Transfer to Toilet: with max assist;squat pivot transfer;bedside  commode Pt/caregiver will Perform Home Exercise Program: Increased strength;Left upper extremity;With written HEP provided;With minimal assist Additional ADL Goal #1: Pt will  recall 3 household items and utilize properly with minimal cues Additional ADL Goal #2: Pt will perform bed mobility with modA for proper sequencing and support as precursor for ADL Additional ADL Goal #3: Pt will perform ADL in sitting with minA for dynamic sitting balance to increase as precursor for ADL.  Plan Discharge plan remains appropriate    Co-evaluation    PT/OT/SLP Co-Evaluation/Treatment: Yes Reason for Co-Treatment: Complexity of the patient's impairments (multi-system involvement);Necessary to address cognition/behavior during functional activity;For patient/therapist safety;To address functional/ADL transfers   OT goals addressed during session: ADL's and self-care;Strengthening/ROM      AM-PAC OT "6 Clicks" Daily Activity     Outcome Measure   Help from another person eating meals?: Total Help from another person taking care of personal grooming?: Total Help from another person toileting, which includes using toliet, bedpan, or urinal?: Total Help from another person bathing (including washing, rinsing, drying)?: Total Help from another person to put on and taking off regular upper body clothing?: Total Help from another person to put on and taking off regular lower body clothing?: Total 6 Click Score: 6    End of Session    OT Visit Diagnosis: Unsteadiness on feet (R26.81);Muscle weakness (generalized) (M62.81) Hemiplegia - Right/Left: Left Hemiplegia - dominant/non-dominant: Non-Dominant   Activity Tolerance Patient limited by lethargy   Patient Left in bed;with call bell/phone within reach;with bed alarm set;with family/visitor present (spouse present. R mitt reapplied)   Nurse Communication          Time: 7510-2585 OT Time Calculation (min): 32 min  Charges: OT General  Charges $OT Visit: 1 Visit OT Treatments $Therapeutic Activity: 8-22 mins  Raynald Kemp, OT Acute Rehabilitation Services Pager: [phone] Office: [phone]    Pilar Grammes 06/01/2021, 12:45 PM

## 2021-06-01 NOTE — PMR Pre-admission (Signed)
PMR Admission Coordinator Pre-Admission Assessment  Patient: Donald [name] is an 65 y.o., male MRN: [id number] DOB: Dec 23, 1955 Height:   Weight: 69.7 kg  Insurance Information HMO:     PPO: yes     PCP:      IPA:      80/20:      OTHER:  PRIMARY: [employer] GEHA      Policy#: [id number] geha      Subscriber: pt CM Name: [name]      Phone#: [phone]     Fax#: [phone] Pre-Cert#: [id number] [place] for CIR given by [name] with [place] with H&P and initial evals due within 3 days of admission (6/27) to fax listed above      Employer:  Benefits:  Phone #: [phone]     Name:  Eff. Date: 07/11/15     Deduct: $350 ($170.16 met)      Out of Pocket Max: $6500 ($185.16 met)      Life Max: n/a CIR: $700/day for days 1-21      SNF: 85% Outpatient: 85%     Co-Ins: 15% Home Health: 85%      Co-Ins: 15% DME: 85%     Co-Ins: 15% Providers: preferred network  SECONDARY:       Policy#:      Phone#:   Development worker, community:       Phone#:   The Engineer, petroleum" for patients in Inpatient Rehabilitation Facilities with attached "Privacy Act [place] Records" was provided and verbally reviewed with: N/A  Emergency Contact Information Contact Information     Name Relation Home Work Mobile   [place] Spouse [phone]  [phone]   Rozell, Theiler   [phone]       Current Medical History  Patient Admitting Diagnosis: R posterior frontal IPH with evolution  History of Present Illness: 65 year old male with recent admission to [facility] for intracranial hemorrhage.   Originally admitted to the hospital May 31 with intracranial hemorrhage of the posterior right frontal lobe.  He was transferred to inpatient rehab for further care on 6/9.  While at the rehab he had acute change in his mentation with difficulty swallowing and facial droop.  Stat head CT showed right intraparenchymal hemorrhage with worsening midline shift.  He was transported to  neuro ICU on 6/12 at [facility] for further evaluation.  His blood pressure was 135/77, temperature 97.5, respiratory rate 18, oxygen saturation 100%, his lungs were clear to auscultation bilaterally, heart S1-S2, present, rhythmic, soft abdomen, no lower extremity edema.  Patient was responsive to noxious stimuli but not verbal or touch.  Not following commands.  Patient admitted to the neuro ICU, for further close neurologic monitoring.  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  Patient was placed on Keppra for seizure prophylaxis.  Medical treatment with hypertonic saline for intracranial hypertension.  Patient underwent cerebral angiogram with poor to no significant opacification of RT transverse sinus in this entirety.  Suspicion for thrombotic occlusion.  Grossly patent RT MCA and RT PCA branches with mass-effect on the RT ACA A2-A3 branches and RT MCA distal M3-M4 branches secondary to hematoma/edema. no AV shunting or AVM or aneurysm identified.  Therapy evaluations were completed and pt was recommended to return to CIR for intense rehabilitation.   Complete NIHSS TOTAL: 23  Patient's medical record from [facility] has been reviewed by the rehabilitation admission coordinator and physician.  Past Medical History  Past Medical History:  Diagnosis Date  Arthritis     Family History   family history includes Hypertension in his mother.  Prior Rehab/Hospitalizations Has the patient had prior rehab or hospitalizations prior to admission? Yes  Has the patient had major surgery during 100 days prior to admission? Yes   Current Medications  Current Facility-Administered Medications:    acetaminophen (TYLENOL) 160 MG/5ML solution 650 mg, 650 mg, Per Tube, Q4H PRN, Kipp Brood, MD, 650 mg at 06/02/21 0007   acetaminophen (TYLENOL) suppository 650 mg, 650 mg, Rectal, Q4H PRN, Harris, Whitney D, NP, 650 mg at 05/23/21 1552    amantadine (SYMMETREL) 50 MG/5ML solution 100 mg, 100 mg, Oral, BID, Leonie Man, Pramod S, MD, 100 mg at 06/03/21 1147   Ampicillin-Sulbactam (UNASYN) 3 g in sodium chloride 0.9 % 100 mL IVPB, 3 g, Intravenous, Q6H, Arrien, Jimmy Picket, MD, Last Rate: 200 mL/hr at 06/04/21 0530, 3 g at 06/04/21 0530   chlorhexidine (PERIDEX) 0.12 % solution 15 mL, 15 mL, Mouth Rinse, BID, Agarwala, Ravi, MD, 15 mL at 06/03/21 2111   Chlorhexidine Gluconate Cloth 2 % PADS 6 each, 6 each, Topical, Daily, Donnetta Simpers, MD, 6 each at 06/02/21 0819   feeding supplement (OSMOLITE 1.5 CAL) liquid 1,000 mL, 1,000 mL, Per Tube, Continuous, Agarwala, Ravi, MD, Last Rate: 50 mL/hr at 06/03/21 0555, 1,000 mL at 06/03/21 0555   feeding supplement (PROSource TF) liquid 45 mL, 45 mL, Per Tube, Daily, Agarwala, Ravi, MD, 45 mL at 06/03/21 1147   free water 250 mL, 250 mL, Per Tube, Q4H, Arrien, Jimmy Picket, MD, 250 mL at 06/03/21 1146   insulin aspart (novoLOG) injection 0-9 Units, 0-9 Units, Subcutaneous, Q4H, Estill Cotta, NP, 1 Units at 06/04/21 0900   insulin glargine (LANTUS) injection 5 Units, 5 Units, Subcutaneous, Daily, Arrien, Jimmy Picket, MD, 5 Units at 06/03/21 1146   MEDLINE mouth rinse, 15 mL, Mouth Rinse, q12n4p, Agarwala, Ravi, MD, 15 mL at 06/03/21 1708   metoprolol tartrate (LOPRESSOR) injection 5 mg, 5 mg, Intravenous, Q6H PRN, Gerald Leitz D, NP  Patients Current Diet:  Diet Order             Diet NPO time specified  Diet effective now                   Precautions / Restrictions Precautions Precautions: Fall Precaution Comments: L hemiparesis, L inattention, pusher to the L, cognitive deficits, cortrak Restrictions Weight Bearing Restrictions: No   Has the patient had 2 or more falls or a fall with injury in the past year? 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 Independent  Indoor Mobility: Did the patient need assistance with walking from room to room (with or without device)? Independent  Stairs: Did the patient need assistance with internal or external stairs (with or without device)? Independent  Functional Cognition: Did the patient need help planning regular tasks such as shopping or remembering to take medications? Needed some help  Home Assistive Devices / Equipment Home Equipment: Shower seat - built in, Grab bars - toilet, Grab bars - tub/shower  Prior Device Use: Indicate devices/aids used by the patient prior to current illness, exacerbation or injury?  cane  Current Functional Level Cognition  Arousal/Alertness:  (  drowsy, awake) Overall Cognitive Status: Impaired/Different from baseline Difficult to assess due to: Level of arousal Current Attention Level: Focused Orientation Level: Oriented to person Following Commands: Follows one step commands inconsistently, Follows one step commands with increased time Safety/Judgement: Decreased awareness of safety, Decreased awareness of deficits General Comments: Improved verbalizations this session. Following commands with increased time. Attention: Sustained Sustained Attention: Impaired Sustained Attention Impairment: Verbal basic, Functional basic Memory: Impaired Awareness: Impaired Awareness Impairment: Intellectual impairment, Emergent impairment, Anticipatory impairment Problem Solving: Impaired Problem Solving Impairment: Functional basic Executive Function: Initiating, Reasoning, Sequencing, Organizing, Self Correcting, Self Monitoring Safety/Judgment: Impaired    Extremity Assessment (includes Sensation/Coordination)  Upper Extremity Assessment: RUE deficits/detail, LUE  deficits/detail, Overall WFL for tasks assessed (difficult to assess d/t lethargy) RUE Deficits / Details: AAROM, WFLs strength 4/5 LUE Deficits / Details: No grasp strength or hand AROM. No active movement at scapula and noting increased tone. LUE Sensation: decreased light touch, decreased proprioception LUE Coordination: decreased fine motor, decreased gross motor  Lower Extremity Assessment: Defer to PT evaluation RLE Deficits / Details: grossly 3+/5, able to move PROM against gravity, follwed commands with 2-4 seconds increased time. RLE Sensation: WNL LLE Deficits / Details: no active movement LLE Sensation:  (pt accurately stating he felt presence or abscence of PT touch 3/5 times)    ADLs  Overall ADL's : Needs assistance/impaired Eating/Feeding: NPO Eating/Feeding Details (indicate cue type and reason): cortrak Grooming: Total assistance Upper Body Bathing: Total assistance, Bed level Lower Body Bathing: Total assistance, Bed level Upper Body Dressing : Total assistance, Bed level Lower Body Dressing: Total assistance, Bed level Toilet Transfer: Total assistance, +2 for physical assistance, +2 for safety/equipment, Maximal assistance Toilet Transfer Details (indicate cue type and reason): simulated for sit to stand, but unable to bear weight on L side so unsafe to stand as pt too fatigued. Pt with minimal initiation on R side Toileting- Clothing Manipulation and Hygiene: Total assistance Toileting - Clothing Manipulation Details (indicate cue type and reason): unable to tell that he had a BM in bed Functional mobility during ADLs: Maximal assistance, Total assistance, +2 for safety/equipment, Cueing for safety, Cueing for sequencing General ADL Comments: Pt lethargic throuhghout session, suspect due to meds. Pt sat EOB several minutes at mostly mod A, varing at times min - max A. Pillow under each forearm for support. Pt with difficulty coming out of neck flexion.    Mobility   Overal bed mobility: Needs Assistance Bed Mobility: Supine to Sit, Sit to Supine Rolling: Max assist Sidelying to sit: Total assist, +2 for physical assistance, +2 for safety/equipment Supine to sit: Total assist, +2 for safety/equipment, +2 for physical assistance Sit to supine: Total assist, +2 for physical assistance, +2 for safety/equipment General bed mobility comments: totalA for all aspects with no initiation of movement    Transfers  Overall transfer level: Needs assistance Equipment used: Ambulation equipment used Transfer via Lift Equipment: Stedy Transfers: Sit to/from Stand Sit to Stand: +2 physical assistance, +2 safety/equipment, Max assist Squat pivot transfers: Max assist, +2 physical assistance General transfer comment: maxA+2 to rise and steady. Sit to stand x 3    Ambulation / Gait / Stairs / Wheelchair Mobility  Ambulation/Gait General Gait Details: unable    Posture / Balance Dynamic Sitting Balance Sitting balance - Comments: requires maxA to maintain static sitting. Less pushing noted this session Balance Overall balance assessment: Needs assistance Sitting-balance support: Single extremity supported, Feet supported Sitting balance-Leahy Scale: Zero Sitting balance - Comments: requires maxA  to maintain static sitting. Less pushing noted this session Postural control: Posterior lean, Left lateral lean Standing balance support: Bilateral upper extremity supported Standing balance-Leahy Scale: Zero Standing balance comment: posterior and lean to L.    Special needs/care consideration Diabetic management yes and Special service needs possible w/c eval   Previous Home Environment (from acute therapy documentation) Living Arrangements: Spouse/significant other  Lives With: Spouse Available Help at Discharge: Family, Available 24 hours/day Type of Home: House Home Layout: One level Home Access: Stairs to enter Entrance Stairs-Rails: Left Entrance  Stairs-Number of Steps: 4 Bathroom Shower/Tub: Multimedia programmer: Standard Bathroom Accessibility: Yes How Accessible: Accessible via walker Additional Comments: has access to RW from pt's [place] for Discharge Living Setting: Patient's home, Lives with (comment) (spouse, [name]) Type of Home at Discharge: House Discharge Home Layout: One level Discharge Home Access: Stairs to enter Entrance Stairs-Rails: Left Entrance Stairs-Number of Steps: 4 Discharge Bathroom Shower/Tub: Walk-in shower Discharge Bathroom Toilet: Standard Discharge [place] Accessibility: Yes How Accessible: Accessible via walker Does the patient have any problems obtaining your medications?: No  Social/Family/Support Systems Patient Roles: Spouse Anticipated Caregiver: Larence Thone Anticipated Caregiver's Contact Information: [phone] Ability/Limitations of Caregiver: mod assist w/c level Caregiver Availability: 24/7 Discharge Plan Discussed with Primary Caregiver: Yes Is Caregiver In Agreement with Plan?: Yes Does Caregiver/Family have Issues with Lodging/Transportation while Pt is in Rehab?: No  Goals Patient/Family Goal for Rehab: PT/OT mod assist, SLP min assist Expected length of stay: 28-32 days Additional Information: outpatient workup for cognitive decline in progress prior to admit Pt/Family Agrees to Admission and willing to participate: Yes Program Orientation Provided & Reviewed with Pt/Caregiver Including Roles  & Responsibilities: Yes Additional Information Needs: pt now pushing, explained to wife likely would be significantly more assist than previously anticipated.  Still must d/c home from [place] verbalized understanding  Barriers to Discharge: Insurance for SNF coverage, Home environment access/layout  Decrease burden of Care through IP rehab admission: n/a  Possible need for SNF placement upon discharge: Not anticipated.   Pt/family prepared to take patient home after CIR.  Reviewed increased burden of care given extension of IPH and [name] verbalizes understanding.  I reviewed that insurance will not likely approve SNF following CIR.   Patient Condition: I have reviewed medical records from [facility], spoken with CM, and patient and spouse. I met with patient at the bedside for inpatient rehabilitation assessment.  Patient will benefit from ongoing PT, OT, and SLP, can actively participate in 3 hours of therapy a day 5 days of the week, and can make measurable gains during the admission.  Patient will also benefit from the coordinated team approach during an Inpatient Acute Rehabilitation admission.  The patient will receive intensive therapy as well as Rehabilitation physician, nursing, social worker, and care management interventions.  Due to bladder management, bowel management, safety, skin/wound care, disease management, medication administration, pain management, and patient education the patient requires 24 hour a day rehabilitation nursing.  The patient is currently max to total +2 with mobility and basic ADLs.  Discharge setting and therapy post discharge at home with home health is anticipated.  Patient has agreed to participate in the Acute Inpatient Rehabilitation Program and will admit today.  Preadmission Screen Completed By:  Michel Santee, PT, DPT 06/04/2021 1:13 PM ______________________________________________________________________   Discussed status with Dr. Ranell Patrick on 06/04/21  at 1:13 PM  and received approval for admission today.  Admission Coordinator:  Urban Gibson  Lannie Fields, PT, DPT time 1:13 PM Sudie Grumbling 06/04/21    Assessment/Plan: Diagnosis: Intracerebral hemorrhage Does the need for close, 24 hr/day Medical supervision in concert with the patient's rehab needs make it unreasonable for this patient to be served in a less intensive setting? Yes Co-Morbidities requiring supervision/potential  complications: hypokalemia, moderate malnutrition, overweight BMI 25.57, seizure, right femoral vein DVT, right lower lobe pneumonia Due to bladder management, bowel management, safety, skin/wound care, disease management, medication administration, pain management, and patient education, does the patient require 24 hr/day rehab nursing? Yes Does the patient require coordinated care of a physician, rehab nurse, PT, OT, and SLP to address physical and functional deficits in the context of the above medical diagnosis(es)? Yes Addressing deficits in the following areas: balance, endurance, locomotion, strength, transferring, bowel/bladder control, bathing, dressing, feeding, grooming, toileting, cognition, speech, language, swallowing, and psychosocial support Can the patient actively participate in an intensive therapy program of at least 3 hrs of therapy 5 days a week? Yes The potential for patient to make measurable gains while on inpatient rehab is good Anticipated functional outcomes upon discharge from inpatient rehab: min assist PT, min assist OT, min assist SLP Estimated rehab length of stay to reach the above functional goals is: 2-3 weeks Anticipated discharge destination: Home 10. Overall Rehab/Functional Prognosis: good   MD Signature: Leeroy Cha, MD

## 2021-06-01 NOTE — Progress Notes (Signed)
Inpatient Rehab Admissions Coordinator:   I met with patient and his wife at bedside to discuss returning to CIR.  Pt is very lethargic again, per wife just received Keppra.  Does not arouse to voice, and barely arouses to sternal rub/noxious stimuli.  I discussed with [name] that with evolution of patient's hemorrhage, and new tendency for pushing per therapy notes, that our goals for CIR would change.  Previously we discussed supervision to min assist, and I let her know that we would now expect probably up to mod assist for mobility and likely w/c level goals at discharge.  I also let her know that insurance does not cover direct admit to SNF rehab from CIR, the only option from CIR is for pt to discharge home into family's care.  I also explained that while insurance will typically cover home health and DME, they would not cover for 24/7 care.  [name] expresses understanding and is confident in the fact that neither she, nor her children, would be interested in pursuing SNF.  She affirms that plan is to return home with 24/7 family support.  I have started insurance authorization and will await determination.   Shann Medal, PT, DPT Admissions Coordinator [phone] 06/01/21  11:32 AM

## 2021-06-02 ENCOUNTER — Inpatient Hospital Stay (HOSPITAL_COMMUNITY): Payer: No Typology Code available for payment source

## 2021-06-02 LAB — GLUCOSE, CAPILLARY
Glucose-Capillary: 144 mg/dL — ABNORMAL HIGH (ref 70–99)
Glucose-Capillary: 147 mg/dL — ABNORMAL HIGH (ref 70–99)
Glucose-Capillary: 165 mg/dL — ABNORMAL HIGH (ref 70–99)
Glucose-Capillary: 166 mg/dL — ABNORMAL HIGH (ref 70–99)
Glucose-Capillary: 170 mg/dL — ABNORMAL HIGH (ref 70–99)
Glucose-Capillary: 173 mg/dL — ABNORMAL HIGH (ref 70–99)
Glucose-Capillary: 178 mg/dL — ABNORMAL HIGH (ref 70–99)
Glucose-Capillary: 195 mg/dL — ABNORMAL HIGH (ref 70–99)
Glucose-Capillary: 224 mg/dL — ABNORMAL HIGH (ref 70–99)

## 2021-06-02 LAB — CBC WITH DIFFERENTIAL/PLATELET
Abs Immature Granulocytes: 0.14 10*3/uL — ABNORMAL HIGH (ref 0.00–0.07)
Basophils Absolute: 0 10*3/uL (ref 0.0–0.1)
Basophils Relative: 0 %
Eosinophils Absolute: 0.1 10*3/uL (ref 0.0–0.5)
Eosinophils Relative: 0 %
HCT: 38.2 % — ABNORMAL LOW (ref 39.0–52.0)
Hemoglobin: 12.1 g/dL — ABNORMAL LOW (ref 13.0–17.0)
Immature Granulocytes: 1 %
Lymphocytes Relative: 9 %
Lymphs Abs: 1.8 10*3/uL (ref 0.7–4.0)
MCH: 29.8 pg (ref 26.0–34.0)
MCHC: 31.7 g/dL (ref 30.0–36.0)
MCV: 94.1 fL (ref 80.0–100.0)
Monocytes Absolute: 0.7 10*3/uL (ref 0.1–1.0)
Monocytes Relative: 3 %
Neutro Abs: 18 10*3/uL — ABNORMAL HIGH (ref 1.7–7.7)
Neutrophils Relative %: 87 %
Platelets: 277 10*3/uL (ref 150–400)
RBC: 4.06 MIL/uL — ABNORMAL LOW (ref 4.22–5.81)
RDW: 14.1 % (ref 11.5–15.5)
WBC: 20.7 10*3/uL — ABNORMAL HIGH (ref 4.0–10.5)
nRBC: 0 % (ref 0.0–0.2)

## 2021-06-02 LAB — BASIC METABOLIC PANEL
Anion gap: 7 (ref 5–15)
BUN: 24 mg/dL — ABNORMAL HIGH (ref 8–23)
CO2: 27 mmol/L (ref 22–32)
Calcium: 8.8 mg/dL — ABNORMAL LOW (ref 8.9–10.3)
Chloride: 110 mmol/L (ref 98–111)
Creatinine, Ser: 0.98 mg/dL (ref 0.61–1.24)
GFR, Estimated: >60 mL/min (ref >60)
Glucose, Bld: 200 mg/dL — ABNORMAL HIGH (ref 70–99)
Potassium: 4.1 mmol/L (ref 3.5–5.1)
Sodium: 144 mmol/L (ref 135–145)

## 2021-06-02 MED ORDER — SODIUM CHLORIDE 0.9 % IV SOLN
3.0000 g | Freq: Four times a day (QID) | INTRAVENOUS | Status: DC
  Administered 2021-06-02 – 2021-06-04 (×9): 3 g via INTRAVENOUS
  Filled 2021-06-02 (×2): qty 8
  Filled 2021-06-02: qty 3
  Filled 2021-06-02 (×3): qty 8
  Filled 2021-06-02: qty 3
  Filled 2021-06-02 (×4): qty 8
  Filled 2021-06-02: qty 3

## 2021-06-02 NOTE — Progress Notes (Signed)
Physical Therapy Treatment Patient Details Name: Donald [name] MRN: [id number] DOB: 04-07-1956 Today's Date: 06/02/2021    History of Present Illness The pt is a 65 yo male re-admitted from [facility] on 6/12 due to sudden onset of worsening mental status, seizures, and facial droop. Repeat CT revealed enlargement of original R frontal ICH with worsening midline shift. Significant PMH includes: memory deficits, OSA.Marland Kitchen    PT Comments    Patient more awake this session once mobilized. Patient with limited cervical ROM. Performed cervical extension PROM. Patient with limited command following but able to verbalize pain and discomfort. Patient easily fatigued. Patient with poor sitting balance requiring maxA to maintain due to posterior lean. Continue to recommend comprehensive inpatient rehab (CIR) for post-acute therapy needs.     Follow Up Recommendations  CIR     Equipment Recommendations  3in1 (PT);Wheelchair (measurements PT);Wheelchair cushion (measurements PT);Hospital bed    Recommendations for Other Services       Precautions / Restrictions Precautions Precautions: Fall;Other (comment) Precaution Comments: L hemiparesis, L inattention, pusher to the L, cognitive deficits, cortrak Restrictions Weight Bearing Restrictions: No    Mobility  Bed Mobility Overal bed mobility: Needs Assistance Bed Mobility: Supine to Sit;Sit to Supine     Supine to sit: Total assist;+2 for safety/equipment;+2 for physical assistance Sit to supine: Total assist;+2 for physical assistance;+2 for safety/equipment   General bed mobility comments: totalA for all aspects with no initiation of movement    Transfers                 General transfer comment: deferred due to not following commands  Ambulation/Gait                 Stairs             Wheelchair Mobility    Modified Rankin (Stroke Patients Only) Modified Rankin (Stroke Patients Only) Pre-Morbid Rankin Score: No  symptoms Modified Rankin: Severe disability     Balance Overall balance assessment: Needs assistance Sitting-balance support: Bilateral upper extremity supported;Single extremity supported;Feet supported Sitting balance-Leahy Scale: Poor Sitting balance - Comments: requires maxA to maintain static sitting. Less pushing noted this session Postural control: Posterior lean                                  Cognition Arousal/Alertness: Lethargic Behavior During Therapy: Flat affect Overall Cognitive Status: Difficult to assess Area of Impairment: Following commands;Safety/judgement;Awareness;Problem solving                       Following Commands: Follows one step commands inconsistently;Follows one step commands with increased time Safety/Judgement: Decreased awareness of safety;Decreased awareness of deficits Awareness: Intellectual Problem Solving: Requires verbal cues;Requires tactile cues;Slow processing;Decreased initiation;Difficulty sequencing General Comments: Improved verbalizations this session. Following commands with increased time.      Exercises Other Exercises Other Exercises: Neck extension PROM    General Comments        Pertinent Vitals/Pain Pain Assessment: Faces Faces Pain Scale: Hurts little more Pain Location: Neck with manual therapy and R LE Pain Descriptors / Indicators: Grimacing Pain Intervention(s): Monitored during session;Repositioned    Home Living                      Prior Function            PT Goals (current goals can now be found in the care plan  section) Acute Rehab PT Goals Patient Stated Goal: spouse wants pt to return to CIR PT Goal Formulation: With patient/family Time For Goal Achievement: 06/12/21 Potential to Achieve Goals: Fair Progress towards PT goals: Progressing toward goals    Frequency    Min 4X/week      PT Plan Current plan remains appropriate    Co-evaluation               AM-PAC PT "6 Clicks" Mobility   Outcome Measure  Help needed turning from your back to your side while in a flat bed without using bedrails?: Total Help needed moving from lying on your back to sitting on the side of a flat bed without using bedrails?: Total Help needed moving to and from a bed to a chair (including a wheelchair)?: Total Help needed standing up from a chair using your arms (e.g., wheelchair or bedside chair)?: Total Help needed to walk in hospital room?: Total Help needed climbing 3-5 steps with a railing? : Total 6 Click Score: 6    End of Session   Activity Tolerance: Patient tolerated treatment well Patient left: in bed;with call bell/phone within reach;with bed alarm set;with family/visitor present Nurse Communication: Mobility status PT Visit Diagnosis: Other abnormalities of gait and mobility (R26.89);Muscle weakness (generalized) (M62.81);Hemiplegia and hemiparesis Hemiplegia - Right/Left: Left Hemiplegia - dominant/non-dominant: Non-dominant Hemiplegia - caused by: Nontraumatic intracerebral hemorrhage     Time: 4098-1191 PT Time Calculation (min) (ACUTE ONLY): 28 min  Charges:  $Therapeutic Activity: 23-37 mins                     Jodey Burbano A. Dan Humphreys PT, DPT Acute Rehabilitation Services Pager [phone] Office [phone]    Viviann Spare 06/02/2021, 4:52 PM

## 2021-06-02 NOTE — Progress Notes (Signed)
Inpatient Rehab Admissions Coordinator:   I have insurance auth for this patient.  Dr. Ella Jubilee notes tmax >100 and WBC up to 20.7 today.  Workup pending.  Will hold admission for now.   Estill Dooms, PT, DPT Admissions Coordinator [phone] 06/02/21  10:55 AM

## 2021-06-02 NOTE — Progress Notes (Signed)
STROKE TEAM PROGRESS NOTE   SUBJECTIVE (INTERVAL HISTORY) His wife is at the bedside. Pt neuro stable, no acute event overnight.    Patient is less sleepy today but can be aroused and appears and interactive.  He is afebrile.  Vital signs are stable.  Neurological exam is unchanged.  Blood pressure adequately controlled.   Continues to have dysphagia and has tube feeding.  He has temperature greater than 100 and white count is elevated.  Serum sodium is down to 144.Marland Kitchen  Patient wife had not allowed him to get him amantadine till she discusses with me  OBJECTIVE Temp:  [99.1 F (37.3 C)-100.5 F (38.1 C)] 100 F (37.8 C) (06/22 1023) Pulse Rate:  [90-120] 90 (06/22 1023) Cardiac Rhythm: Normal sinus rhythm (06/22 0744) Resp:  [16-20] 16 (06/22 1023) BP: (102-118)/(62-74) 103/72 (06/22 1023) SpO2:  [93 %-100 %] 100 % (06/22 1023) Weight:  [69.7 kg] 69.7 kg (06/22 0500)  Recent Labs  Lab 06/01/21 2040 06/02/21 0042 06/02/21 0425 06/02/21 0813 06/02/21 1137  GLUCAP 210* 224* 195* 173* 170*   Recent Labs  Lab 05/28/21 0450 05/28/21 1636 05/29/21 0617 05/29/21 1655 05/30/21 0543 05/31/21 0449 06/01/21 0316 06/02/21 0344  NA 157*   < > 158* 160* 156* 152* 147* 144  K 3.8  --  3.6  --   --  4.4 4.4 4.1  CL 114*  --  124*  --   --  112* 114* 110  CO2 31  --  27  --   --  32 29 27  GLUCOSE 134*  --  250*  --   --  210* 213* 200*  BUN 26*  --  25*  --   --  20 24* 24*  CREATININE 1.09  --  0.98  --   --  1.09 0.96 0.98  CALCIUM 10.4*  --  9.4  --   --  9.1 9.0 8.8*   < > = values in this interval not displayed.   No results for input(s): AST, ALT, ALKPHOS, BILITOT, PROT, ALBUMIN in the last 168 hours.  Recent Labs  Lab 05/27/21 0300 05/28/21 0450 05/29/21 0617 05/31/21 0449 06/02/21 0344  WBC 23.9* 17.7* 12.9* 12.8* 20.7*  NEUTROABS  --   --   --  10.0* 18.0*  HGB 14.3 15.6 13.5 12.9* 12.1*  HCT 44.6 49.7 43.8 41.9 38.2*  MCV 93.3 95.4 95.4 96.8 94.1  PLT 425* 492* 434*  376 277   No results for input(s): CKTOTAL, CKMB, CKMBINDEX, TROPONINI in the last 168 hours. No results for input(s): LABPROT, INR in the last 72 hours. No results for input(s): COLORURINE, LABSPEC, PHURINE, GLUCOSEU, HGBUR, BILIRUBINUR, KETONESUR, PROTEINUR, UROBILINOGEN, NITRITE, LEUKOCYTESUR in the last 72 hours.  Invalid input(s): APPERANCEUR      Component Value Date/Time   CHOL 232 (H) 05/11/2021 1118   CHOL 204 (H) 09/11/2020 1449   TRIG 73 05/11/2021 1118   HDL 77 05/11/2021 1118   HDL 63 09/11/2020 1449   CHOLHDL 3.0 05/11/2021 1118   VLDL 15 05/11/2021 1118   LDLCALC 140 (H) 05/11/2021 1118   LDLCALC 132 (H) 09/11/2020 1449   LDLCALC 107 (H) 04/29/2020 0826   Lab Results  Component Value Date   HGBA1C 5.8 (H) 05/11/2021      Component Value Date/Time   LABOPIA NONE DETECTED 05/11/2021 1109   COCAINSCRNUR NONE DETECTED 05/11/2021 1109   LABBENZ NONE DETECTED 05/11/2021 1109   AMPHETMU NONE DETECTED 05/11/2021 1109   THCU NONE DETECTED  05/11/2021 1109   LABBARB NONE DETECTED 05/11/2021 1109    No results for input(s): ETH in the last 168 hours.   I have personally reviewed the radiological images below and agree with the radiology interpretations.  DG Abd 1 View  Result Date: 05/20/2021 CLINICAL DATA:  Abdominal pain. EXAM: ABDOMEN - 1 VIEW COMPARISON:  None. FINDINGS: Large amount of intestinal gas throughout small and large bowel suggesting ileus. There does not appear to be a large amount of fecal matter. No focal obstruction is evident. No free air. No abnormal calcifications or significant bone findings. IMPRESSION: Large amount of intestinal gas suggesting ileus. Electronically Signed   By: Paulina Fusi M.D.   On: 05/20/2021 19:25   CT HEAD WO CONTRAST  Result Date: 05/27/2021 CLINICAL DATA:  65 year old male with right hemisphere hemorrhage at code stroke presentation 05/11/2021. Subsequent encounter. EXAM: CT HEAD WITHOUT CONTRAST TECHNIQUE: Contiguous  axial images were obtained from the base of the skull through the vertex without intravenous contrast. COMPARISON:  Head CT 05/23/2021 and earlier. FINDINGS: Brain: Evolving right superior frontal lobe hemorrhage. Slowly fading hyperdense and mixed density blood products involving the superior and middle frontal gyri in an area of up to 48 x 35 x 50 mm (AP by transverse by CC). Continued regional edema and mass effect including effaced right lateral ventricle and leftward midline shift up to 7 mm at the midportion of the septum pellucidum. No ventriculomegaly. No intraventricular or significant extra-axial hemorrhage. Basilar cisterns remain patent. Stable gray-white matter differentiation elsewhere. No acute cortically based infarct. Vascular: Mild Calcified atherosclerosis at the skull base. Skull: Stable, negative. Sinuses/Orbits: Mild to moderate maxillary alveolar recess mucosal thickening. Otherwise paranasal sinuses and mastoids are stable and well aerated. Other: Right nasoenteric tube in place. Visualized orbits and scalp soft tissues are within normal limits. IMPRESSION: 1. Slowly evolving right superior frontal lobe hemorrhage, not significantly changed in size or configuration since 05/23/2021, with stable regional edema and mass effect. 2. No new intracranial abnormality. Electronically Signed   By: Odessa Fleming M.D.   On: 05/27/2021 06:24   CT HEAD WO CONTRAST  Result Date: 05/23/2021 CLINICAL DATA:  Follow-up intracerebral hemorrhage EXAM: CT HEAD WITHOUT CONTRAST TECHNIQUE: Contiguous axial images were obtained from the base of the skull through the vertex without intravenous contrast. COMPARISON:  CT 05/23/2021 FINDINGS: Brain: No significant interval change in the overall size of the intraparenchymal hemorrhagic component seen in the right frontal with some extensive surrounding hypo attenuating edematous changes. Locoregional mass effect with sulcal effacement and effacement of the right lateral  ventricle as well as stable subfalcine herniation with a right to left midline shift of approximately 14 mm. Slight medialization of the right uncus without clear transtentorial herniation is also unchanged from prior. Basal cisterns remain patent. No new sites of hemorrhage. No new loss of gray-white differentiation. Vascular: No hyperdense vessel or unexpected calcification. Skull: Normal. Negative for fracture or focal lesion. EEG leads in place. Sinuses/Orbits: Mild mural thickening in the maxillary sinuses. Paranasal sinuses and mastoid air cells are otherwise predominantly clear. Included orbital structures are unremarkable. Other: None. IMPRESSION: 1. Stable appearance of the right frontal lobe intraparenchymal hemorrhage with surrounding cerebral edema, local mass effect as well as subfalcine midline shift of approximately 14 mm in slight medial ization of the right uncus, not significantly changed from comparison imaging. 2. No new sites of hemorrhage or new gray-white differentiation loss. Electronically Signed   By: Kreg Shropshire M.D.   On: 05/23/2021 23:58  CT HEAD WO CONTRAST  Result Date: 05/23/2021 CLINICAL DATA:  Follow-up right frontal parenchymal hemorrhage seen earlier today. EXAM: CT HEAD WITHOUT CONTRAST TECHNIQUE: Contiguous axial images were obtained from the base of the skull through the vertex without intravenous contrast. COMPARISON:  Earlier today. FINDINGS: Brain: No significant change in the previously described high right frontal lobe parenchymal hematoma with extensive surrounding edema and midline shift to the left. The midline shift currently measures 10 mm on image number 16/3. No interval ventricular enlargement. No new hemorrhage. Vascular: No hyperdense vessel or unexpected calcification. Skull: Normal. Negative for fracture or focal lesion. Sinuses/Orbits: Stable small left frontal sinus osteoma. Mild moderate left maxillary sinus mucosal thickening without significant  change. Unremarkable orbits. Other: Bilateral concha bullosa. IMPRESSION: 1. Stable large high right frontal parenchymal hematoma with extensive associated edema and midline shift to the left. 2. Stable left maxillary chronic sinusitis. Electronically Signed   By: Beckie Salts M.D.   On: 05/23/2021 18:00   CT HEAD WO CONTRAST  Result Date: 05/23/2021 CLINICAL DATA:  Cerebral hemorrhage, decreased responsiveness EXAM: CT HEAD WITHOUT CONTRAST TECHNIQUE: Contiguous axial images were obtained from the base of the skull through the vertex without intravenous contrast. COMPARISON:  Earlier same day FINDINGS: Brain: Parenchymal hematoma centered within the high right frontal lobe does not measure substantially changed from the prior study. Extensive surrounding edema is again noted with regional mass effect. Leftward midline shift measures similar at 14 mm at the level of the septum pellucidum. Ventricle caliber is unchanged with no new trapping. No significant central herniation. There is no new loss of gray-white differentiation. Intraventricular hemorrhage is identified. Vascular: No new findings. Skull: Unremarkable. Sinuses/Orbits: No acute finding. Other: None. IMPRESSION: No substantial change in right frontal parenchymal hemorrhage, edema, and mass effect including midline shift. No new ventricle trapping or acute infarction. Electronically Signed   By: Guadlupe Spanish M.D.   On: 05/23/2021 12:49   CT HEAD WO CONTRAST  Result Date: 05/11/2021 CLINICAL DATA:  Stroke.  Intracranial hemorrhage follow-up. EXAM: CT HEAD WITHOUT CONTRAST TECHNIQUE: Contiguous axial images were obtained from the base of the skull through the vertex without intravenous contrast. COMPARISON:  Same day head CT. FINDINGS: Brain: Mild interval increase in the size of the right frontal intraparenchymal hemorrhage, measuring 4.5 x 2.6 x 3.5 cm (previously 4.2 x 2.3 x 3.1 cm). Similar adjacent small volume subarachnoid hemorrhage. A small  amount of subdural hemorrhage in this region is also not excluded. Surrounding edema is similar. Similar local mass effect without midline shift. Basal cisterns are patent. No evidence of acute large vascular territory infarct elsewhere. Similar mild white matter hypodensities, likely chronic microvascular ischemic disease. No hydrocephalus. Vascular: No hyperdense vessel.  Calcific atherosclerosis. Skull: No acute fracture. Sinuses/Orbits: Small left frontal sinus osteoma. Mild right and moderate left maxillary sinus mucosal thickening. Unremarkable orbits. Other: No mastoid effusions. IMPRESSION: Mild interval increase in the size of the right frontal intraparenchymal hemorrhage, measuring 4.5 x 2.6 x 3.5 cm (previously 4.2 x 2.3 x 3.1 cm). Similar adjacent small volume hemorrhage. Electronically Signed   By: Feliberto Harts MD   On: 05/11/2021 17:05   MR BRAIN WO CONTRAST  Result Date: 05/12/2021 CLINICAL DATA:  65 year old male code stroke presentation with intra-axial right hemisphere hemorrhage. Hypertensive (194/104) on presentation. EXAM: MRI HEAD WITHOUT CONTRAST TECHNIQUE: Multiplanar, multiecho pulse sequences of the brain and surrounding structures were obtained without intravenous contrast. COMPARISON:  CT head CTA head and neck 05/11/2021 FINDINGS: Brain: Coronal T2  weighted imaging could not be obtained. And some of the exam is intermittently degraded by motion artifact despite repeated imaging attempts. T1 isointense intra-axial hemorrhage in the posterior right frontal lobe demonstrates a layering hematocrit level (series 13, image 19) and blood encompasses 43 by 38 x 33 mm (AP by transverse by CC) for an estimated volume of 26 mL. Regional edema. Mild regional mass effect. Trace subarachnoid hemorrhage also suspected on axial FLAIR imaging. Additionally, on SWI there is also chronic hemosiderin in the right parietal lobe on series 19 image 34. But no other definite chronic blood products.  Susceptibility related abnormal diffusion in the posterior right frontal lobe with no convincing larger area of restricted diffusion. No restricted diffusion elsewhere. No IVH or ventriculomegaly. Normal basilar cisterns. Negative pituitary and cervicomedullary junction. Wallace Cullens and white matter signal outside of the affected right hemisphere is largely normal for age with mild nonspecific white matter changes. Vascular: Major intracranial vascular flow voids are preserved. Skull and upper cervical spine: Visualized bone marrow signal is within normal limits. Grossly negative cervical spine. Sinuses/Orbits: Negative orbits. Mild to moderate maxillary sinus mucosal thickening. Other: Mastoids are clear. Grossly normal visible internal auditory structures. IMPRESSION: 1. Posterior right frontal lobe intra-axial hemorrhage with layering hematocrit level has not significantly changed (estimated blood volume of 26 mL). Surrounding edema and mild regional mass effect. Trace subarachnoid hemorrhage. 2. No larger underlying infarct is evident. But chronic hemosiderin in the right parietal lobe indicates a previous intra-axial hemorrhage in the region. 3. No other acute intracranial abnormality. Electronically Signed   By: Odessa Fleming M.D.   On: 05/12/2021 07:26   IR IVC FILTER PLMT / S&I Lenise Arena GUID/MOD SED  Result Date: 05/27/2021 INDICATION: 65 year old gentleman with lower extremity DVT and acute intracranial hemorrhage presents to IR for IVC filter placement EXAM: Retrievable IVC filter placement utilizing fluoroscopic and ultrasound guidance MEDICATIONS: None. ANESTHESIA/SEDATION: One mg IV Versed; 50 mcg IV Fentanyl Moderate Sedation Time:  12 minutes The patient was continuously monitored during the procedure by the interventional radiology nurse under my direct supervision. FLUOROSCOPY TIME:  Fluoroscopy Time: 1 minutes 6 seconds (9 mGy). COMPLICATIONS: None immediate. PROCEDURE: Informed written consent was obtained  from the patient after a thorough discussion of the procedural risks, benefits and alternatives. All questions were addressed. Maximal Sterile Barrier Technique was utilized including caps, mask, sterile gowns, sterile gloves, sterile drape, hand hygiene and skin antiseptic. A timeout was performed prior to the initiation of the procedure. Patient positioned supine on the angiography table. Right neck prepped and draped in usual sterile fashion. All elements of maximal sterile barrier were utilized including, cap, mask, sterile gown, sterile gloves, large sterile drape, hand scrubbing and 2% Chlorhexidine for skin cleaning. The right internal jugular vein was evaluated with ultrasound and shown to be patent. A permanent ultrasound image was obtained and placed in the patient's medical record. Using sterile gel and a sterile probe cover, the right internal jugular vein was entered with a 21 ga needle during real time ultrasound guidance. 21 gauge needle exchanged for a transitional dilator set over 0.018 inch guidewire. Transitional dilator set exchanged for 10 fr sheath over 0.035 inch guidewire. Sheath placed to the infrarenal IVC and CO2 venogram performed to delineate the position of the renal veins. Retrievable Denali IVC filter deployed in the infrarenal location. Sheath removed and hemostasis achieved with manual compression. IMPRESSION: Retrievable Denali IVC filter placed. PLAN: This IVC filter is potentially retrievable. The patient will be assessed for filter retrieval  by Interventional Radiology in approximately 8-12 weeks. Further recommendations regarding filter retrieval, continued surveillance or declaration of device permanence, will be made at that time. Electronically Signed   By: Acquanetta Belling M.D.   On: 05/27/2021 16:38   IR US Guide Vasc Access Right  Result Date: 05/27/2021 INDICATION: 65 year old gentleman with lower extremity DVT and acute intracranial hemorrhage presents to IR for IVC  filter placement EXAM: Retrievable IVC filter placement utilizing fluoroscopic and ultrasound guidance MEDICATIONS: None. ANESTHESIA/SEDATION: One mg IV Versed; 50 mcg IV Fentanyl Moderate Sedation Time:  12 minutes The patient was continuously monitored during the procedure by the interventional radiology nurse under my direct supervision. FLUOROSCOPY TIME:  Fluoroscopy Time: 1 minutes 6 seconds (9 mGy). COMPLICATIONS: None immediate. PROCEDURE: Informed written consent was obtained from the patient after a thorough discussion of the procedural risks, benefits and alternatives. All questions were addressed. Maximal Sterile Barrier Technique was utilized including caps, mask, sterile gowns, sterile gloves, sterile drape, hand hygiene and skin antiseptic. A timeout was performed prior to the initiation of the procedure. Patient positioned supine on the angiography table. Right neck prepped and draped in usual sterile fashion. All elements of maximal sterile barrier were utilized including, cap, mask, sterile gown, sterile gloves, large sterile drape, hand scrubbing and 2% Chlorhexidine for skin cleaning. The right internal jugular vein was evaluated with ultrasound and shown to be patent. A permanent ultrasound image was obtained and placed in the patient's medical record. Using sterile gel and a sterile probe cover, the right internal jugular vein was entered with a 21 ga needle during real time ultrasound guidance. 21 gauge needle exchanged for a transitional dilator set over 0.018 inch guidewire. Transitional dilator set exchanged for 10 fr sheath over 0.035 inch guidewire. Sheath placed to the infrarenal IVC and CO2 venogram performed to delineate the position of the renal veins. Retrievable Denali IVC filter deployed in the infrarenal location. Sheath removed and hemostasis achieved with manual compression. IMPRESSION: Retrievable Denali IVC filter placed. PLAN: This IVC filter is potentially retrievable. The  patient will be assessed for filter retrieval by Interventional Radiology in approximately 8-12 weeks. Further recommendations regarding filter retrieval, continued surveillance or declaration of device permanence, will be made at that time. Electronically Signed   By: Acquanetta Belling M.D.   On: 05/27/2021 16:38   DG Chest Port 1 View  Result Date: 06/02/2021 CLINICAL DATA:  Increased white blood cell count.  Fever. EXAM: PORTABLE CHEST 1 VIEW COMPARISON:  May 31, 2021. FINDINGS: New medial left basilar/retrocardiac opacity. No visible pneumothorax or pleural effusions on this single semi erect radiograph. Cardiomediastinal silhouette is within normal limits and similar to prior. Enteric tube courses below the diaphragm with the tip outside the field of view. IMPRESSION: New medial left basilar/retrocardiac airspace opacity, which is suspicious for pneumonia given reported leukocytosis and fever. Electronically Signed   By: Feliberto Harts MD   On: 06/02/2021 13:57   DG Chest Port 1 View  Result Date: 05/31/2021 CLINICAL DATA:  Dyspnea EXAM: PORTABLE CHEST 1 VIEW COMPARISON:  Portable exam 0912 hours compared to 05/24/2021 FINDINGS: Feeding tube traverses esophagus and visualized stomach. Normal heart size, mediastinal contours, and pulmonary vascularity. Lungs clear. No pulmonary infiltrate, pleural effusion, or pneumothorax. Osseous structures unremarkable. IMPRESSION: No acute abnormalities. Electronically Signed   By: Ulyses Southward M.D.   On: 05/31/2021 10:54   DG CHEST PORT 1 VIEW  Result Date: 05/24/2021 CLINICAL DATA:  Leukocytosis EXAM: PORTABLE CHEST 1 VIEW COMPARISON:  04/28/15  FINDINGS: Cardiac shadow is within normal limits. Mild aortic calcifications are noted. The lungs are well aerated bilaterally with minimal left basilar atelectasis. No bony abnormality is seen. IMPRESSION: Minimal left basilar atelectasis. Electronically Signed   By: Alcide Clever M.D.   On: 05/24/2021 10:52   DG Abd  Portable 1V  Result Date: 05/24/2021 CLINICAL DATA:  Feeding tube placement EXAM: PORTABLE ABDOMEN - 1 VIEW COMPARISON:  05/20/2021 FINDINGS: Limited radiograph of the lower chest and upper abdomen was obtained for the purposes of enteric tube localization. Enteric tube is seen coursing below the diaphragm with distal tip terminating within the expected location of the gastric body. Air-filled large and small bowel loops throughout the abdomen, similar to prior. IMPRESSION: Enteric tube tip terminates within the gastric body. Electronically Signed   By: Duanne Guess D.O.   On: 05/24/2021 13:50   EEG adult  Result Date: 05/23/2021 Charlsie Quest, MD     05/23/2021  4:11 PM Patient Name: Ernie Kasler MRN: [id number] Epilepsy Attending: Charlsie Quest Referring Physician/Provider: Dr Bing Neighbors Date: 05/23/2021 Duration: 25.36 mins Patient history:  65 y.o. male currently admitted for ICH on 05/11/21 developed somnolence and aphasia with R gaze deviation that resolved with ativan. EEG to evaluate for seizure Level of alertness: asleep AEDs during EEG study: LEV Technical aspects: This EEG study was done with scalp electrodes positioned according to the 10-20 International system of electrode placement. Electrical activity was acquired at a sampling rate of 500Hz  and reviewed with a high frequency filter of 70Hz  and a low frequency filter of 1Hz . EEG data were recorded continuously and digitally stored. Description: No posterior dominant rhythm was seen. Sleep was characterized by sleep spindles (12 to 14 Hz), maximal frontocentral region. EEG showed continuous generalized 3 to 6 Hz theta-delta slowing. There is an excessive amount of 15 to 18 Hz, 2-3 uV beta activity distributed symmetrically and diffusely.  Hyperventilation and photic stimulation were not performed.   ABNORMALITY - Continuous slow, generalized - Excessive beta, generalized IMPRESSION: This study is suggestive of moderate to severe  diffuse encephalopathy, nonspecific etiology. The excessive beta activity seen in the background is most likely due to the effect of benzodiazepine and is a benign EEG pattern. No seizures or epileptiform discharges were seen throughout the recording. Priyanka Annabelle Harman   Overnight EEG with video  Result Date: 05/24/2021 Charlsie Quest, MD     05/25/2021  9:42 AM Patient Name: Tayshawn Purnell MRN: [id number] Epilepsy Attending: Charlsie Quest Referring Physician/Provider: Dr Bing Neighbors Duration: 05/23/2021 1655 to 05/24/2021 1655  Patient history:  65 y.o. male currently admitted for ICH on 05/11/21 developed somnolence and aphasia with R gaze deviation that resolved with ativan. EEG to evaluate for seizure  Level of alertness: awake, asleep  AEDs during EEG study: LEV  Technical aspects: This EEG study was done with scalp electrodes positioned according to the 10-20 International system of electrode placement. Electrical activity was acquired at a sampling rate of 500Hz  and reviewed with a high frequency filter of 70Hz  and a low frequency filter of 1Hz . EEG data were recorded continuously and digitally stored.  Description: No posterior dominant rhythm was seen. Sleep was characterized by sleep spindles (12 to 14 Hz), asymmetry ( R<L) maximal frontocentral region. EEG showed continuous generalized and lateralized right hemisphere 3 to 6 Hz theta-delta slowing. Hyperventilation and photic stimulation were not performed.    ABNORMALITY - Continuous slow, generalized and lateralized right hemisphere - Spindle asymmetry, right<left  IMPRESSION: This study is suggestive of cortical dysfunction in right hemisphere likely secondary to underlying structural abnormality.  Additionally there is moderate to severe diffuse encephalopathy, nonspecific etiology. No seizures or definite epileptiform discharges were seen throughout the recording.   Charlsie Quest   MR MRV HEAD W WO CONTRAST  Result Date:  05/30/2021 CLINICAL DATA:  Right parenchymal hemorrhage.  Abnormal arteriogram. EXAM: MR VENOGRAM HEAD WITHOUT AND WITH CONTRAST TECHNIQUE: Angiographic images of the intracranial venous structures were acquired using MRV technique without and with intravenous contrast. CONTRAST:  6.68mL GADAVIST GADOBUTROL 1 MMOL/ML IV SOLN COMPARISON:  Cerebral arteriogram 05/28/2021. MR venogram 05/12/2021. FINDINGS: Stable appearance of dominant left transverse sinus. Postcontrast images demonstrate no thrombus. Straight sinus and deep cerebral veins are intact. Cortical veins are unremarkable. Superior sagittal sinus normal. Sagittal sinus and intra on the jugular veins are normal bilaterally. IMPRESSION: Normal variant MR venogram without significant change. No sinus thrombosis. Electronically Signed   By: Marin Roberts M.D.   On: 05/30/2021 12:49   MR MRV HEAD W WO CONTRAST  Result Date: 05/12/2021 CLINICAL DATA:  Intracranial hemorrhage. EXAM: MR VENOGRAM HEAD WITHOUT AND WITH CONTRAST TECHNIQUE: Angiographic images of the intracranial venous structures were acquired using MRV technique without and with intravenous contrast. CONTRAST:  7mL GADAVIST GADOBUTROL 1 MMOL/ML IV SOLN COMPARISON:  Brain MRI performed earlier today 05/12/2021. Noncontrast head CT examinations 05/11/2021. CT angiogram head/neck 05/11/2021. FINDINGS: The superior sagittal sinus, internal cerebral veins, vein of Galen, straight sinus, transverse sinuses, sigmoid sinuses and visualized jugular veins are patent. No appreciable intracranial venous thrombosis. Hypoplastic right transverse and sigmoid dural venous sinuses. An acute parenchymal hemorrhage within the posterior right frontal lobe is grossly unchanged in size as compared to the brain MRI performed earlier today. There is mild curvilinear vascular enhancement overlying the site of hemorrhage, which may be reactive. Elsewhere, no abnormal intracranial enhancement is identified.  IMPRESSION: No evidence of intracranial venous thrombosis. Non dominant right transverse and sigmoid dural venous sinuses. Electronically Signed   By: Jackey Loge DO   On: 05/12/2021 11:27   ECHOCARDIOGRAM COMPLETE  Result Date: 05/11/2021    ECHOCARDIOGRAM REPORT   Patient Name:   [facility] Date of Exam: 05/11/2021 Medical Rec #:  [id number]        Height:       68.0 in Accession #:    [id number]       Weight:       155.2 lb Date of Birth:  [birth year]         BSA:          1.835 m Patient Age:    65 years         BP:           115/77 mmHg Patient Gender: M                HR:           84 bpm. Exam Location:  Inpatient Procedure: 2D Echo, Cardiac Doppler and Color Doppler Indications:    Stroke I63.9  History:        Patient has no prior history of Echocardiogram examinations.  Sonographer:    Roosvelt Maser RDCS Referring Phys: [id number] Reyne Dumas [facility] IMPRESSIONS  1. Left ventricular ejection fraction, by estimation, is 65 to 70%. The left ventricle has normal function. The left ventricle has no regional wall motion abnormalities. Left ventricular diastolic parameters were normal.  2. Right ventricular systolic function is normal. The right  ventricular size is normal. Tricuspid regurgitation signal is inadequate for assessing PA pressure.  3. The mitral valve is normal in structure. No evidence of mitral valve regurgitation.  4. The aortic valve was not well visualized. Aortic valve regurgitation is not visualized. No aortic stenosis is present.  5. The inferior vena cava is normal in size with greater than 50% respiratory variability, suggesting right atrial pressure of 3 mmHg. FINDINGS  Left Ventricle: Left ventricular ejection fraction, by estimation, is 65 to 70%. The left ventricle has normal function. The left ventricle has no regional wall motion abnormalities. The left ventricular internal cavity size was normal in size. There is  no left ventricular hypertrophy. Left ventricular diastolic parameters  were normal. Right Ventricle: The right ventricular size is normal. No increase in right ventricular wall thickness. Right ventricular systolic function is normal. Tricuspid regurgitation signal is inadequate for assessing PA pressure. Left Atrium: Left atrial size was normal in size. Right Atrium: Right atrial size was normal in size. Pericardium: There is no evidence of pericardial effusion. Mitral Valve: The mitral valve is normal in structure. No evidence of mitral valve regurgitation. Tricuspid Valve: The tricuspid valve is normal in structure. Tricuspid valve regurgitation is not demonstrated. Aortic Valve: The aortic valve was not well visualized. Aortic valve regurgitation is not visualized. No aortic stenosis is present. Aortic valve mean gradient measures 3.0 mmHg. Aortic valve peak gradient measures 6.7 mmHg. Aortic valve area, by VTI measures 2.77 cm. Pulmonic Valve: The pulmonic valve was not well visualized. Pulmonic valve regurgitation is not visualized. Aorta: The aortic root and ascending aorta are structurally normal, with no evidence of dilitation. Venous: The inferior vena cava is normal in size with greater than 50% respiratory variability, suggesting right atrial pressure of 3 mmHg. IAS/Shunts: The interatrial septum was not well visualized.  LEFT VENTRICLE PLAX 2D LVIDd:         4.90 cm  Diastology LVIDs:         2.80 cm  LV e' medial:    8.38 cm/s LV PW:         0.80 cm  LV E/e' medial:  9.0 LV IVS:        0.90 cm  LV e' lateral:   9.14 cm/s LVOT diam:     1.90 cm  LV E/e' lateral: 8.2 LV SV:         69 LV SV Index:   37 LVOT Area:     2.84 cm  RIGHT VENTRICLE RV Basal diam:  2.90 cm LEFT ATRIUM           Index       RIGHT ATRIUM           Index LA diam:      3.10 cm 1.69 cm/m  RA Area:     11.20 cm LA Vol (A2C): 26.8 ml 14.61 ml/m RA Volume:   21.90 ml  11.94 ml/m LA Vol (A4C): 39.2 ml 21.36 ml/m  AORTIC VALVE AV Area (Vmax):    2.64 cm AV Area (Vmean):   2.59 cm AV Area (VTI):      2.77 cm AV Vmax:           129.00 cm/s AV Vmean:          86.700 cm/s AV VTI:            0.248 m AV Peak Grad:      6.7 mmHg AV Mean Grad:      3.0 mmHg LVOT  Vmax:         120.00 cm/s LVOT Vmean:        79.200 cm/s LVOT VTI:          0.242 m LVOT/AV VTI ratio: 0.98  AORTA Ao Root diam: 2.50 cm Ao Asc diam:  3.30 cm MITRAL VALVE MV Area (PHT): 3.60 cm     SHUNTS MV Decel Time: 211 msec     Systemic VTI:  0.24 m MV E velocity: 75.30 cm/s   Systemic Diam: 1.90 cm MV A velocity: 107.00 cm/s MV E/A ratio:  0.70 Epifanio Lesches MD Electronically signed by Epifanio Lesches MD Signature Date/Time: 05/11/2021/5:28:15 PM    Final    CT HEAD CODE STROKE WO CONTRAST  Result Date: 05/23/2021 CLINICAL DATA:  Code stroke.  Acute stroke. EXAM: CT HEAD WITHOUT CONTRAST TECHNIQUE: Contiguous axial images were obtained from the base of the skull through the vertex without intravenous contrast. COMPARISON:  Head CT from 05/11/2021 FINDINGS: Brain: 5.5 x 2.8 cm hematoma in the subcortical high right frontal region with extensive vasogenic edema in the adjacent brain that is progressed. There is worsening local mass effect with midline shift now measuring 13 mm. No entrapment. No cytotoxic edema seen. Vascular: No hyperdense vessel or unexpected calcification. Skull: Normal. Negative for fracture or focal lesion. Sinuses/Orbits: Negative Other: Critical Value/emergent results were called by telephone at the time of interpretation on 05/23/2021 at 9:10 am to provider [facility] , who verbally acknowledged these results. ASPECTS [facility] Stroke Program Early CT Score) Not scored in this setting IMPRESSION: Progressed right frontal hematoma and vasogenic edema with midline shift now measuring 13 mm. Electronically Signed   By: Marnee Spring M.D.   On: 05/23/2021 09:12   CT HEAD CODE STROKE WO CONTRAST  Result Date: 05/11/2021 CLINICAL DATA:  Code stroke. Left-sided weakness, nausea, and vomiting. EXAM: CT HEAD WITHOUT  CONTRAST TECHNIQUE: Contiguous axial images were obtained from the base of the skull through the vertex without intravenous contrast. COMPARISON:  None. FINDINGS: Brain: An acute parenchymal hemorrhage in the high posterior right frontal lobe measures 4.2 x 2.3 x 3.1 cm (approximate volume of 15 mL). There is mild surrounding edema without midline shift, and there is a small amount of adjacent subarachnoid hemorrhage. A very small amount of coexistent subdural hemorrhage is also not excluded in this region. No acute infarct is identified elsewhere. The ventricles are normal in size. Hypodensities in the cerebral white matter bilaterally are nonspecific but compatible with mild chronic small vessel ischemic disease. Vascular: No hyperdense vessel. Skull: No fracture or suspicious osseous lesion. Sinuses/Orbits: Small osteoma in the left frontal sinus. Mild right and moderate left maxillary sinus mucosal thickening. Clear mastoid air cells. Unremarkable orbits. Other: None. ASPECTS [facility] Stroke Program Early CT Score) Not scored in the presence of acute hemorrhage. IMPRESSION: Acute parenchymal hemorrhage in the posterior right frontal lobe with small amount of adjacent subarachnoid hemorrhage. These results were communicated to Dr. Iver Nestle at 9:08 am on 05/11/2021 by text page via the [facility] messaging system. Electronically Signed   By: Sebastian Ache M.D.   On: 05/11/2021 09:12   VAS [place] LOWER EXTREMITY VENOUS (DVT)  Result Date: 05/26/2021  Lower Venous DVT Study Patient Name:  REY DANSBY  Date of Exam:   05/26/2021 Medical Rec #: [id number]         Accession #:    [id number] Date of Birth: May 25, 1956          Patient Gender: M Patient Age:  [id number] [facility] Procedure:      VAS US LOWER EXTREMITY VENOUS (DVT) Referring Phys: [id number] Eliezer ChampagneIMOTHY S GEORGE --------------------------------------------------------------------------------  Indications: Fever.  Limitations: Poor ultrasound/tissue  interface. Comparison Study: No previous exams. Performing Technologist: Ernestene MentionJody Hill RVT, RDMS  Examination Guidelines: A complete evaluation includes B-mode imaging, spectral Doppler, color Doppler, and power Doppler as needed of all accessible portions of each vessel. Bilateral testing is considered an integral part of a complete examination. Limited examinations for reoccurring indications may be performed as noted. The reflux portion of the exam is performed with the patient in reverse Trendelenburg.  +---------+---------------+---------+-----------+----------+--------------+ RIGHT    CompressibilityPhasicitySpontaneityPropertiesThrombus Aging +---------+---------------+---------+-----------+----------+--------------+ CFV      Full           Yes      Yes                                 +---------+---------------+---------+-----------+----------+--------------+ SFJ      Full                                                        +---------+---------------+---------+-----------+----------+--------------+ FV Prox  Full           Yes      Yes                                 +---------+---------------+---------+-----------+----------+--------------+ FV Mid   Full           Yes      Yes                                 +---------+---------------+---------+-----------+----------+--------------+ FV DistalFull           Yes      Yes                                 +---------+---------------+---------+-----------+----------+--------------+ PFV      Full                                                        +---------+---------------+---------+-----------+----------+--------------+ POP      Full           Yes      Yes                                 +---------+---------------+---------+-----------+----------+--------------+ PTV      Full                                                         +---------+---------------+---------+-----------+----------+--------------+ PERO     Full                                                        +---------+---------------+---------+-----------+----------+--------------+   +---------+---------------+---------+-----------+----------+-------------------+  LEFT     CompressibilityPhasicitySpontaneityPropertiesThrombus Aging      +---------+---------------+---------+-----------+----------+-------------------+ CFV      Partial        Yes      Yes                  Acute- non                                                                occlusive           +---------+---------------+---------+-----------+----------+-------------------+ SFJ      Full                                                             +---------+---------------+---------+-----------+----------+-------------------+ FV Prox  Full           Yes      Yes                                      +---------+---------------+---------+-----------+----------+-------------------+ FV Mid   Full           Yes      Yes                                      +---------+---------------+---------+-----------+----------+-------------------+ FV DistalFull           Yes      Yes                                      +---------+---------------+---------+-----------+----------+-------------------+ PFV      Full                                                             +---------+---------------+---------+-----------+----------+-------------------+ POP      Full           Yes      Yes                                      +---------+---------------+---------+-----------+----------+-------------------+ PTV                                                   Not well visualized +---------+---------------+---------+-----------+----------+-------------------+ PERO     Full                                                              +---------+---------------+---------+-----------+----------+-------------------+  EIV                     Yes      Yes                  Patent by color and                                                       doppler             +---------+---------------+---------+-----------+----------+-------------------+     Summary: BILATERAL: - No evidence of superficial venous thrombosis in the lower extremities, bilaterally. -No evidence of popliteal cyst, bilaterally. RIGHT: - There is no evidence of deep vein thrombosis in the lower extremity.  LEFT: - Findings consistent with acute deep vein thrombosis involving the left common femoral vein.  *See table(s) above for measurements and observations. Electronically signed by Coral Else MD on 05/26/2021 at 7:50:00 PM.    Final    CT ANGIO HEAD NECK W WO CM (CODE STROKE)  Result Date: 05/23/2021 CLINICAL DATA:  Enlarging hematoma EXAM: CT ANGIOGRAPHY HEAD AND NECK TECHNIQUE: Multidetector CT imaging of the head and neck was performed using the standard protocol during bolus administration of intravenous contrast. Multiplanar CT image reconstructions and MIPs were obtained to evaluate the vascular anatomy. Carotid stenosis measurements (when applicable) are obtained utilizing NASCET criteria, using the distal internal carotid diameter as the denominator. CONTRAST:  50mL OMNIPAQUE IOHEXOL 350 MG/ML SOLN COMPARISON:  CTA 05/11/2021 FINDINGS: CTA NECK FINDINGS Aortic arch: Normal Right carotid system: Widely patent vessels with smooth contour. No atheromatous changes Left carotid system: Widely patent vessels with smooth contour. No noted atheromatous changes Vertebral arteries: No proximal subclavian stenosis. The vertebral arteries are widely patent. No suspected vertebral beading when allowing for streak artifact. Skeleton: Multilevel degenerative disc narrowing and ridging canal and foraminal encroachment the lower cervical levels. Other neck: No evidence of  inflammation or mass. Upper chest: Negative Review of the MIP images confirms the above findings CTA HEAD FINDINGS Anterior circulation: Major vessels are smooth and widely patent. No branch occlusion, generalized beading, or aneurysm. There is a cortical branch at the right frontal parietal convexity which is not continuous beyond the level of the upper and lateral hematoma. No visible nidus or early enhancing veins. Posterior circulation: Vertebral and basilar arteries are smooth and widely patent. No branch occlusion, beading, or aneurysm. Venous sinuses: MRV was obtained previously. The veins are largely nonenhancing on this study. Anatomic variants: None significant Review of the MIP images confirms the above findings IMPRESSION: Seemingly truncated arterial cortical branch at the upper and lateral hematoma, without discrete vascular malformation or spot sign. Recommend follow-up after resolution of mass effect to evaluate for small AVM. Electronically Signed   By: Marnee Spring M.D.   On: 05/23/2021 09:54   CT ANGIO HEAD NECK W WO CM (CODE STROKE)  Result Date: 05/11/2021 CLINICAL DATA:  Stroke follow-up.  Left-sided weakness. EXAM: CT ANGIOGRAPHY HEAD AND NECK TECHNIQUE: Multidetector CT imaging of the head and neck was performed using the standard protocol during bolus administration of intravenous contrast. Multiplanar CT image reconstructions and MIPs were obtained to evaluate the vascular anatomy. Carotid stenosis measurements (when applicable) are obtained utilizing NASCET criteria, using the distal internal carotid diameter as the denominator. CONTRAST:  50mL OMNIPAQUE  IOHEXOL 350 MG/ML SOLN COMPARISON:  Same day CT head. FINDINGS: CTA NECK FINDINGS Aortic arch: Great vessel origins are patent. Right carotid system: No evidence of dissection, stenosis (50% or greater) or occlusion. Mild apparent irregularity of the ICA at the skull base in a region of streak artifact. Left carotid system: No  evidence of dissection, stenosis (50% or greater) or occlusion. Mild apparent irregularity of the ICA at the skull base in a region of streak artifact. Vertebral arteries: Codominant. No evidence of dissection, stenosis (50% or greater) or occlusion. Skeleton: Moderate degenerative disc disease at at C5-C6 and C6-C7 with disc height loss, endplate sclerosis and posterior disc osteophyte complexes. Other neck: No acute abnormality Upper chest: Visualized lung apices are clear. Review of the MIP images confirms the above findings CTA HEAD FINDINGS Anterior circulation: No large vessel occlusion or proximal hemodynamically significant stenosis. Evaluation of the distal vasculature is limited due to venous contamination. No evidence of and arteriovenous malformation or aneurysm in the region of intraparenchymal hemorrhage, although acute blood products limit evaluation. Posterior circulation: No large vessel occlusion or proximal hemodynamically significant stenosis. Venous sinuses: The superior sagittal sinus is narrowed in the region of hemorrhage, most likely from mass effect. Small right transverse and sigmoid sinuses. Review of the MIP images confirms the above findings IMPRESSION: CTA Head: 1. No large vessel occlusion or hemodynamically significant proximal arterial stenosis. 2. The superior sagittal sinus is narrowed in the region of hemorrhage with poorly visualized draining cortical veins in this region. While indeterminate, this may be secondary to mass effect given no definite/clear intraluminal thrombus. Given these findings and the location of hemorrhage; however, recommend low threshold for follow-up CTV or MRI with contrast to ensure stability and exclude worsening thrombosis. 3. No evidence of and arteriovenous malformation or aneurysm in the region of intraparenchymal hemorrhage, although acute blood products limit evaluation. Follow-up imaging after resolution of hemorrhage could provide further  assessment if clinically indicated. CTA Neck: 1. No significant (greater than 50%) stenosis. 2. Mild apparent irregularity of bilateral ICAs at the skull base is favored artifactual given streak artifact from dental amalgam in this region. Fibromuscular dysplasia is a differential consideration. Findings and recommendations discussed with Dr. Iver Nestle via telephone at 9:59 a.m. 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 EXAM: BILATERAL COMMON CAROTID AND INNOMINATE ANGIOGRAPHY COMPARISON:  CT angiogram of the head and neck of May 23, 2021, and May 11, 2021. MEDICATIONS: Heparin none. No antibiotic was administered within 1 hour of the procedure. ANESTHESIA/SEDATION: Versed 1.5 mg IV; Fentanyl 37.5 mcg IV Moderate Sedation Time:  52 minutes The patient was continuously monitored during the procedure by the interventional radiology nurse under my direct supervision. CONTRAST:  Isovue 300 approximately 80 mL. FLUOROSCOPY TIME:  Fluoroscopy Time: 15 minutes 24 seconds (1503 mGy). COMPLICATIONS: None immediate. TECHNIQUE: Informed written consent was obtained from the patient after a thorough discussion of the procedural risks, benefits and alternatives. All questions were addressed. Maximal Sterile Barrier Technique was utilized including caps, mask, sterile gowns, sterile gloves, sterile drape, hand hygiene and skin antiseptic. A timeout was performed prior to the initiation of the procedure. The right groin was prepped and draped in the usual sterile fashion. Thereafter using modified Seldinger technique, transfemoral access into the right common femoral artery was obtained without difficulty.  Over a 0.035 inch guidewire, a 5 French Pinnacle sheath was inserted. Through this, and also over 0.035 inch guidewire, a 5 [place]  JB 1 catheter was advanced to the aortic arch region and selectively positioned in the right common carotid artery, the right vertebral artery, the left common carotid artery and the left vertebral artery. FINDINGS: The right vertebral artery origin is widely patent. The vessel is seen to opacify to the cranial skull base. Wide opacification is seen of right vertebrobasilar junction and the right posterior-inferior cerebellar artery. The basilar artery, the posterior cerebral arteries, the superior cerebellar arteries and the anterior-inferior cerebellar arteries opacify into the capillary and venous phases. Venous phase demonstrates non opacification of the right transverse sinus extending from the right lateral origin of the torcula to the transverse sigmoid junction. Retrograde opacification of the sigmoid sinus is seen from the superior cerebellar veins with retrograde flow into the right transverse sinus sigmoid junction. The right common carotid arteriogram demonstrates the right external carotid artery and its major branches to be widely patent. The right internal carotid artery at the bulb to the cranial skull base is widely patent. The petrous, the cavernous and the supraclinoid segments are widely patent. The right middle cerebral artery is patent into the anterior temporal branch and the bifurcation branches. Bundling of the right middle cerebral artery M3 M4 branch is noted secondary to the superiorly and the medially located intracranial hemorrhage. The right anterior cerebral artery opacifies into the capillary and venous phases. The venous phase demonstrates a minimally opacified right transverse sinus. The delayed lateral projection of the DSA demonstrates delayed clearance of the arterial portion of the contrast compared to the venous. The left common carotid arteriogram demonstrates the left external carotid artery and its major branches to be widely patent. The left internal carotid artery at the  bulb to the cranial skull base is widely patent. The petrous, the cavernous and the supraclinoid segments are widely patent. The left middle cerebral and the left anterior cerebral artery opacify into the capillary and venous phases. Left vertebral origin is widely patent. The vessel is seen to opacify to the cranial skull base. The left vertebrobasilar junction and the left posterior-inferior cerebral artery demonstrate wide patency. The basilar artery, and the opacified portions of the posterior cerebral arteries, the superior cerebellar arteries and the anterior-inferior cerebellar arteries is seen into the capillary and the venous phases. The venous phase again demonstrates non opacification of the right transverse sinus with suggestion of small dural veins. Again demonstrated is the retrograde opacification of the right sigmoid sinus transverse sinus junction from the ipsilateral superior cerebellar venous structures. IMPRESSION: Non opacification of the right transverse sinus extending from the torcula to the right transverse sinus/sigmoid sinus junction with retrograde opacification of the right sigmoid sinus from the superior cerebellar veins on the on the right side. Above findings supportive of venous hypertension with delayed clearance of contrast from the right cerebral hemisphere, and also with retrograde opacification of the right sigmoid sinus from the right superior cerebellar hemispheric veins. Attenuated caliber of the right sigmoid sinus noted though the right internal jugular vein appears comparable in size compared to the contralateral left side. Angiographically no evidence of intracranial aneurysm arteriovenous shunting, or of arteriovenous malformation or dissections. PLAN: Follow-up MRA of the brain, and MRV of the dural venous sinuses with and without contrast in 3 months. Electronically Signed   By: Julieanne Cotton M.D.   On: 05/28/2021 16:01   IR ANGIO VERTEBRAL SEL  VERTEBRAL BILAT  MOD SED  Result Date: 05/31/2021 CLINICAL DATA:  Right sided intra cerebral hemorrhage x2 with mass-effect. EXAM: BILATERAL COMMON CAROTID AND INNOMINATE ANGIOGRAPHY COMPARISON:  CT angiogram of the head and neck of May 23, 2021, and May 11, 2021. MEDICATIONS: Heparin none. No antibiotic was administered within 1 hour of the procedure. ANESTHESIA/SEDATION: Versed 1.5 mg IV; Fentanyl 37.5 mcg IV Moderate Sedation Time:  52 minutes The patient was continuously monitored during the procedure by the interventional radiology nurse under my direct supervision. CONTRAST:  Isovue 300 approximately 80 mL. FLUOROSCOPY TIME:  Fluoroscopy Time: 15 minutes 24 seconds (1503 mGy). COMPLICATIONS: None immediate. TECHNIQUE: Informed written consent was obtained from the patient after a thorough discussion of the procedural risks, benefits and alternatives. All questions were addressed. Maximal Sterile Barrier Technique was utilized including caps, mask, sterile gowns, sterile gloves, sterile drape, hand hygiene and skin antiseptic. A timeout was performed prior to the initiation of the procedure. The right groin was prepped and draped in the usual sterile fashion. Thereafter using modified Seldinger technique, transfemoral access into the right common femoral artery was obtained without difficulty. Over a 0.035 inch guidewire, a 5 French Pinnacle sheath was inserted. Through this, and also over 0.035 inch guidewire, a 5 [place] JB 1 catheter was advanced to the aortic arch region and selectively positioned in the right common carotid artery, the right vertebral artery, the left common carotid artery and the left vertebral artery. FINDINGS: The right vertebral artery origin is widely patent. The vessel is seen to opacify to the cranial skull base. Wide opacification is seen of right vertebrobasilar junction and the right posterior-inferior cerebellar artery. The basilar artery, the posterior cerebral arteries, the superior  cerebellar arteries and the anterior-inferior cerebellar arteries opacify into the capillary and venous phases. Venous phase demonstrates non opacification of the right transverse sinus extending from the right lateral origin of the torcula to the transverse sigmoid junction. Retrograde opacification of the sigmoid sinus is seen from the superior cerebellar veins with retrograde flow into the right transverse sinus sigmoid junction. The right common carotid arteriogram demonstrates the right external carotid artery and its major branches to be widely patent. The right internal carotid artery at the bulb to the cranial skull base is widely patent. The petrous, the cavernous and the supraclinoid segments are widely patent. The right middle cerebral artery is patent into the anterior temporal branch and the bifurcation branches. Bundling of the right middle cerebral artery M3 M4 branch is noted secondary to the superiorly and the medially located intracranial hemorrhage. The right anterior cerebral artery opacifies into the capillary and venous phases. The venous phase demonstrates a minimally opacified right transverse sinus. The delayed lateral projection of the DSA demonstrates delayed clearance of the arterial portion of the contrast compared to the venous. The left common carotid arteriogram demonstrates the left external carotid artery and its major branches to be widely patent. The left internal carotid artery at the bulb to the cranial skull base is widely patent. The petrous, the cavernous and the supraclinoid segments are widely patent. The left middle cerebral and the left anterior cerebral artery opacify into the capillary and venous phases. Left vertebral origin is widely patent. The vessel is seen to opacify to the cranial skull base. The left vertebrobasilar junction and the left posterior-inferior cerebral artery demonstrate wide patency. The basilar artery, and the opacified portions of the posterior  cerebral arteries, the superior cerebellar arteries and the anterior-inferior cerebellar arteries is seen into  the capillary and the venous phases. The venous phase again demonstrates non opacification of the right transverse sinus with suggestion of small dural veins. Again demonstrated is the retrograde opacification of the right sigmoid sinus transverse sinus junction from the ipsilateral superior cerebellar venous structures. IMPRESSION: Non opacification of the right transverse sinus extending from the torcula to the right transverse sinus/sigmoid sinus junction with retrograde opacification of the right sigmoid sinus from the superior cerebellar veins on the on the right side. Above findings supportive of venous hypertension with delayed clearance of contrast from the right cerebral hemisphere, and also with retrograde opacification of the right sigmoid sinus from the right superior cerebellar hemispheric veins. Attenuated caliber of the right sigmoid sinus noted though the right internal jugular vein appears comparable in size compared to the contralateral left side. Angiographically no evidence of intracranial aneurysm arteriovenous shunting, or of arteriovenous malformation or dissections. PLAN: Follow-up MRA of the brain, and MRV of the dural venous sinuses with and without contrast in 3 months. Electronically Signed   By: Julieanne Cotton M.D.   On: 05/28/2021 16:01     PHYSICAL EXAM  Temp:  [99.1 F (37.3 C)-100.5 F (38.1 C)] 100 F (37.8 C) (06/22 1023) Pulse Rate:  [90-120] 90 (06/22 1023) Resp:  [16-20] 16 (06/22 1023) BP: (102-118)/(62-74) 103/72 (06/22 1023) SpO2:  [93 %-100 %] 100 % (06/22 1023) Weight:  [69.7 kg] 69.7 kg (06/22 0500)  General - Well nourished, well developed, middle-aged African-American male sleepy but arousable.  Ophthalmologic - fundi not visualized due to noncooperation.  Cardiovascular - Regular rhythm and mild tachycardia.  Neuro - sleepy but eyes  open with voice, he was able to follow peripheral commands on the right hand and most central commands.  Moderate dysarthria, oriented to place and people age but not to time. Right gaze preference, but unable to cross midline. Not blinking to visual threat left greater than right. Left facial droop. Tongue protrusion not cooperative. Right UE spontaneous movement against gravity and RLE some spontaneous movement but not against gravity, LUE and LLE plegic with increased muscle tone. Sensation, coordination not cooperative and gait not tested.   ASSESSMENT/PLAN Donald [name] is a 65 y.o. male with history of dementia with behavioral disturbance and OSA on CPAP admitted for right gaze and left hemoplegia, left facial droop. 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 He was stabilized and sent to CIR. 6/12 found to be lethargic and drowsy, CT repeat showed increased cerebral edema and midline shift. EEG no seizure s/p ativan and keppra. He was transfer back to ICU for further management.   Encephalopathy  Improving Likely combination of cerebral edema, ? Seiziure, fever, leukocytosis and AKI Treat underlying condition as below Neuro check closely  Cerebral edema  CT head 6/12 showed progression of right frontal ICH and cerebral edema with midline shift CTA head and neck 6/12 no discrete AVM or spot sign CT repeat 6/16 unchanged, no progression of midline shift or hematoma. On 3% saline 50->65->30 cc/h->off Na Q6h Na 135->140->144->151->158->155->156->157>158 Na goal 145-155  ? 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 No CSVT on  venous  phase CT repeat mildly increased left frontal ICH 05/12/21 MRI brain no significant change of ICH since last CT MRV Non dominant right transverse and sigmoid dural venous sinuses. CT repeat 6/16 unchanged, no progression of midline shift or hematoma. Cerebral angiogram no AVM or aneurysm, concerning for Poor to no sig opacification of RT transverse sinus in its entirety, ?Suspicious for thrombotic occlusion. 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  No evidence of clot on venous sinus thrombosis.   2D Echo  EF 65-70% LDL 140 HgbA1c 5.8 UDS neg Heparin subq for VTE prophylaxis No antithrombotic prior to admission, now on No antithrombotic given ICH Ongoing aggressive stroke risk factor management Therapy recommendations:  pending Disposition:  Pending   ?? 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  DVT LE venous Doppler showed left common femoral vein DVT Not a candidate for anticoagulation at this time S/p IVC filter 6/16  Dementia  Has appointment with [facility] for LP and evaluation of dementia, but pt not able to go due to onset of ICH Delirium precaution Continue outpt follow up with [facility]  Hyperlipidemia Home meds:  none  LDL 140, goal < 70 Will consider low dose statin (lipitor 20) on discharge   Dysphagia  Speech to follow NPO  S/p  cortrak on TF @ 50  Fever and leukocytosis Tmax 101.4->102.6->100.9->102.4->101.7->afebrile Leukocytosis WBC 7.6->12.4 ->8.1->7.8->22.9->20.4->24.8-> 23.9->17.7 CCM on board Continue monitoring UA neg CXR Minimal left basilar atelectasis On Rocephin now  Other Stroke Risk Factors Advanced age Obstructive sleep apnea, on CPAP at home  Other Active Problems AKI Cre 1.20->1.45->1.03->1.02->0.98->1.09 - on IVF and TF Sleepiness and lethargy-Keppra DC'd 06/01/2021 and amantadine added  Hospital day # 10 Patient continues to show some progress.  But remains sleepy. Have discontinued Keppra and will try some amantadine to promote wakefulness.   Continue ongoing therapies and continue mobilization out of bed as tolerated  .  Fever and elevated white count to be addressed by primary team.  Long discussion with patient and wife at the bedside and brother over the phone answered questions.  Discussed with Dr. Ella Jubilee.  May need to consider PEG tube if dysphagia does not improve soon.  Greater than 50% time during this 25-minute visit was spent on counseling and coordination of care about his dysphagia and stroke and answering questions.  Delia Heady, MD   06/02/2021 3:27 PM    To contact Stroke Continuity provider, please refer to [email or web address]. After hours, contact General Neurology

## 2021-06-02 NOTE — Progress Notes (Addendum)
PROGRESS NOTE    Donald [name]  NGE:[id number] DOB: Dec 12, 1956 DOA: 05/23/2021 PCP: [name], MD    Brief Narrative:  Donald [name] was admitted to the hospital with the working diagnosis of acute worsening intracranial hemorrhage of the right frontal lobe.  Complicated with encephalopathy, aspiration pneumonia and severe sepsis.    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  Originally admitted to the hospital May 31 with intracranial hemorrhage of the posterior right frontal lobe.  He was transferred to inpatient rehab for further care.  While at the rehab he had acute change in his mentation with difficulty swallowing and facial droop.  Stat head CT showed right intraparenchymal hemorrhage with worsening midline shift.  He was transported to neuro ICU for further evaluation.  His blood pressure was 135/77, temperature 97.5, respiratory rate 18, oxygen saturation 100%, his lungs are clear to auscultation bilaterally, heart S1-S2, present, rhythmic, soft abdomen, no lower extremity edema.  Patient was responsive to noxious stimuli but not verbal or touch.  Not following commands.   Patient admitted to the neuro ICU, for further close neurologic monitoring.  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  Patient was placed on Keppra for seizure prophylaxis.  Medical treatment with hypertonic saline for intracranial hypertension.  Further work up with MRV with no evidence of clot or venous sinus thrombosis.    He was found to have a right femoral DVT and an IVC filter was placed (06/16).     Plan to remove IVC filter when reduced bleeding risk and anticoagulation can be started. Family aware that after prolonged time filter can be source of further blood clots     He was diagnosed with left lower  lobe pneumonia (not present on admission) and received antibiotic therapy with ceftriaxone.   Transferred to progressive care, continue to have severe swallow dysfunction. Has NG tube for nutrition.   Has been very somnolent and hyporeactive.   06/22 with signs of recurrent aspiration pneumonia, positive fever and worsening leukocytosis.   Assessment & Plan:   Principal Problem:   Intracerebral hemorrhage Active Problems:   Hypokalemia   Malnutrition of moderate degree   Seizure (HCC)   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HCC)   Right lower lobe pneumonia   Prediabetes   Hyperglycemia    Intracranial hemorrhage right frontal lobe, complicated with toxic and metabolic acute encephalopathy. Persistent encephalopathy, at the time of my examination, no following commands or opening his eyes. Reaches with his right hand to pain stimuli, continue to have left sided hemiparesis.     Patient with recurrent intracranial bleed, persistent encephalopathy with swallow dysfunction. Currently his prognosis is poor. I offered his wife palliative care consultation.   Keppra has been discontinued, now on amantadine.    2. Left femoral DVT. Not candidate for full anticoagulation. He had a IVC filter placed on 05/27/21. Discussed with his family at the bedside, will plan to continue holding prophylactic anticoagulation for now.   Continue holding enoxaparin, but will have low threshold to resume duye to increased risk of thrombosis.    3. Left lower lobe pneumonia/ recurrent aspiration pneumonia with severe sepsis (not present on admission). Patient has completed therapy with ceftriaxone.  Patient now with worsening leukocytosis up to 20, positive fever and congestions. High pre-test probability for aspiration pneumonia with severe sepsis, end-organ failure worsening encephalopathy.  Plan to resume antibiotic therapy with Unasyn, continue NPO and aspiration precautions. Tube feedings per NG tube, likely  will need PEG tube.    4. Pre-diabetes Hgb A1c 5,8 complicated with hyperglycemia. Moderate calorie protein malnutrition. Fasting glucose is 200 with capillary 195, 173, 170.    Continue with basal insulin with 5 units glargine plus insulin sliding scale for glucose cover and monitoring.   Continue with tube feedings.     5. Hypokalemia/ hypernatremia/ hyperchloremia and contraction alkalosis.  Na today is 144, K is 4,1 and serum bicarbonate at 27.  Continue with free water flushes and tube feedings, follow up on renal function and electrolytes.    Patient continue to be at high risk for worsening sepsis and encephalopathy   Status is: Inpatient  Remains inpatient appropriate because:IV treatments appropriate due to intensity of illness or inability to take PO  Dispo: The patient is from: Home              Anticipated d/c is to: CIR              Patient currently is not medically stable to d/c.   Difficult to place patient No   DVT prophylaxis: IVC filter   Code Status:   full  Family Communication:  I spoke with patient's wife at the bedside, we talked in detail about patient's condition, plan of care and prognosis and all questions were addressed. We discussed goals of care.       Nutrition Status: Nutrition Problem: Moderate Malnutrition Etiology: chronic illness Signs/Symptoms: mild fat depletion, mild muscle depletion, moderate muscle depletion Interventions: Tube feeding     Consultants:  Neurology    Antimicrobials:  Unasyn     Subjective: Patient continue with severe encephalopathy, poorly responsive, positive fever.   Objective: Vitals:   06/02/21 0357 06/02/21 0500 06/02/21 0809 06/02/21 1023  BP:   114/74 103/72  Pulse:   (!) 101 90  Resp:   18 16  Temp: 99.1 F (37.3 C)  (!) 100.5 F (38.1 C) 100 F (37.8 C)  TempSrc: Axillary  Oral Oral  SpO2: 95%  97% 100%  Weight:  69.7 kg      Intake/Output Summary (Last 24 hours) at 06/02/2021  1131 Last data filed at 06/02/2021 0848 Gross per 24 hour  Intake 600 ml  Output 775 ml  Net -175 ml   Filed Weights   05/31/21 0459 06/01/21 0451 06/02/21 0500  Weight: 66.1 kg 67 kg 69.7 kg    Examination:   General: deconditioned and ill looking appearing  Neurology: poorly responsive, localizing to pain stimuli, not following commands or opening his eyes.  E ENT: no pallor, no icterus, oral mucosa moist. NG tube in place.  Cardiovascular: No JVD. S1-S2 present, rhythmic, no gallops, rubs, or murmurs. No lower extremity edema. Pulmonary: positive breath sounds bilaterally, decreased inspiratory effort. no wheezing, upper airway rhonchi. No rales.  Gastrointestinal. Abdomen soft and non tender Skin. No rashes Musculoskeletal: no joint deformities     Data Reviewed: I have personally reviewed following labs and imaging studies  CBC: Recent Labs  Lab 05/27/21 0300 05/28/21 0450 05/29/21 0617 05/31/21 0449 06/02/21 0344  WBC 23.9* 17.7* 12.9* 12.8* 20.7*  NEUTROABS  --   --   --  10.0* 18.0*  HGB 14.3 15.6 13.5 12.9* 12.1*  HCT 44.6 49.7 43.8 41.9 38.2*  MCV 93.3 95.4 95.4 96.8 94.1  PLT 425* 492* 434* 376 277   Basic Metabolic Panel: Recent Labs  Lab 05/28/21 0450 05/28/21 1636 05/29/21 0617 05/29/21 1655 05/30/21 0543 05/31/21 0449 06/01/21 0316 06/02/21 0344  NA 157*   < > 158* 160* 156* 152* 147* 144  K 3.8  --  3.6  --   --  4.4 4.4 4.1  CL 114*  --  124*  --   --  112* 114* 110  CO2 31  --  27  --   --  32 29 27  GLUCOSE 134*  --  250*  --   --  210* 213* 200*  BUN 26*  --  25*  --   --  20 24* 24*  CREATININE 1.09  --  0.98  --   --  1.09 0.96 0.98  CALCIUM 10.4*  --  9.4  --   --  9.1 9.0 8.8*   < > = values in this interval not displayed.   GFR: Estimated Creatinine Clearance: 66.2 mL/min (by C-G formula based on SCr of 0.98 mg/dL). Liver Function Tests: No results for input(s): AST, ALT, ALKPHOS, BILITOT, PROT, ALBUMIN in the last 168  hours. No results for input(s): LIPASE, AMYLASE in the last 168 hours. No results for input(s): AMMONIA in the last 168 hours. Coagulation Profile: No results for input(s): INR, PROTIME in the last 168 hours. Cardiac Enzymes: No results for input(s): CKTOTAL, CKMB, CKMBINDEX, TROPONINI in the last 168 hours. BNP (last 3 results) No results for input(s): PROBNP in the last 8760 hours. HbA1C: No results for input(s): HGBA1C in the last 72 hours. CBG: Recent Labs  Lab 06/01/21 1547 06/01/21 2040 06/02/21 0042 06/02/21 0425 06/02/21 0813  GLUCAP 229* 210* 224* 195* 173*   Lipid Profile: No results for input(s): CHOL, HDL, LDLCALC, TRIG, CHOLHDL, LDLDIRECT in the last 72 hours. Thyroid Function Tests: No results for input(s): TSH, T4TOTAL, FREET4, T3FREE, THYROIDAB in the last 72 hours. Anemia Panel: No results for input(s): VITAMINB12, FOLATE, FERRITIN, TIBC, IRON, RETICCTPCT in the last 72 hours.    Radiology Studies: I have reviewed all of the imaging during this hospital visit personally     Scheduled Meds:  amantadine  100 mg Oral BID   chlorhexidine  15 mL Mouth Rinse BID   Chlorhexidine Gluconate Cloth  6 each Topical Daily   feeding supplement (PROSource TF)  45 mL Per Tube Daily   free water  250 mL Per Tube Q4H   insulin aspart  0-9 Units Subcutaneous Q4H   insulin glargine  5 Units Subcutaneous Daily   mouth rinse  15 mL Mouth Rinse q12n4p   Continuous Infusions:  ampicillin-sulbactam (UNASYN) IV     feeding supplement (OSMOLITE 1.5 CAL) 1,000 mL (05/31/21 1441)     LOS: 10 days        Jenni Thew Annett Gula, MD

## 2021-06-03 LAB — CBC WITH DIFFERENTIAL/PLATELET
Abs Immature Granulocytes: 0.09 10*3/uL — ABNORMAL HIGH (ref 0.00–0.07)
Basophils Absolute: 0 10*3/uL (ref 0.0–0.1)
Basophils Relative: 0 %
Eosinophils Absolute: 0.1 10*3/uL (ref 0.0–0.5)
Eosinophils Relative: 1 %
HCT: 36.5 % — ABNORMAL LOW (ref 39.0–52.0)
Hemoglobin: 12 g/dL — ABNORMAL LOW (ref 13.0–17.0)
Immature Granulocytes: 1 %
Lymphocytes Relative: 13 %
Lymphs Abs: 1.8 10*3/uL (ref 0.7–4.0)
MCH: 30.3 pg (ref 26.0–34.0)
MCHC: 32.9 g/dL (ref 30.0–36.0)
MCV: 92.2 fL (ref 80.0–100.0)
Monocytes Absolute: 0.7 10*3/uL (ref 0.1–1.0)
Monocytes Relative: 5 %
Neutro Abs: 11.1 10*3/uL — ABNORMAL HIGH (ref 1.7–7.7)
Neutrophils Relative %: 80 %
Platelets: 221 10*3/uL (ref 150–400)
RBC: 3.96 MIL/uL — ABNORMAL LOW (ref 4.22–5.81)
RDW: 13.9 % (ref 11.5–15.5)
WBC: 13.8 10*3/uL — ABNORMAL HIGH (ref 4.0–10.5)
nRBC: 0 % (ref 0.0–0.2)

## 2021-06-03 LAB — GLUCOSE, CAPILLARY
Glucose-Capillary: 102 mg/dL — ABNORMAL HIGH (ref 70–99)
Glucose-Capillary: 143 mg/dL — ABNORMAL HIGH (ref 70–99)
Glucose-Capillary: 160 mg/dL — ABNORMAL HIGH (ref 70–99)
Glucose-Capillary: 163 mg/dL — ABNORMAL HIGH (ref 70–99)
Glucose-Capillary: 189 mg/dL — ABNORMAL HIGH (ref 70–99)
Glucose-Capillary: 85 mg/dL (ref 70–99)

## 2021-06-03 LAB — BASIC METABOLIC PANEL
Anion gap: 6 (ref 5–15)
BUN: 19 mg/dL (ref 8–23)
CO2: 26 mmol/L (ref 22–32)
Calcium: 8.6 mg/dL — ABNORMAL LOW (ref 8.9–10.3)
Chloride: 108 mmol/L (ref 98–111)
Creatinine, Ser: 0.83 mg/dL (ref 0.61–1.24)
GFR, Estimated: >60 mL/min (ref >60)
Glucose, Bld: 200 mg/dL — ABNORMAL HIGH (ref 70–99)
Potassium: 4 mmol/L (ref 3.5–5.1)
Sodium: 140 mmol/L (ref 135–145)

## 2021-06-03 MED ORDER — FREE WATER: 250.0000 mL | Freq: Four times a day (QID) | 0 refills | Status: DC

## 2021-06-03 MED ORDER — AMANTADINE HCL 50 MG/5ML PO SOLN: 100.0000 mg | Freq: Two times a day (BID) | ORAL | 0 refills | Status: DC

## 2021-06-03 MED ORDER — INSULIN GLARGINE 100 UNIT/ML ~~LOC~~ SOLN: 5.0000 [IU] | Freq: Every day | SUBCUTANEOUS | 0 refills | Status: DC

## 2021-06-03 MED ORDER — OSMOLITE 1.5 CAL PO LIQD: 1000.0000 mL | ORAL | 0 refills | Status: DC

## 2021-06-03 MED ORDER — ACETAMINOPHEN 160 MG/5ML PO SOLN: 650.0000 mg | ORAL | 0 refills | Status: DC | PRN

## 2021-06-03 MED ORDER — PROSOURCE TF PO LIQD: 45.0000 mL | Freq: Every day | ORAL | 0 refills | Status: DC

## 2021-06-03 MED ORDER — INSULIN ASPART 100 UNIT/ML IJ SOLN: 0.0000 [IU] | INTRAMUSCULAR | 11 refills | Status: DC

## 2021-06-03 MED ORDER — AMOXICILLIN-POT CLAVULANATE 250-62.5 MG/5ML PO SUSR: 250.0000 mg | Freq: Two times a day (BID) | ORAL | 0 refills | Status: DC

## 2021-06-03 NOTE — Discharge Summary (Addendum)
Physician Discharge Summary  Martyn Timme ZOX:[id number] DOB: 01-05-1956 DOA: 05/23/2021  PCP: [name], MD  Admit date: 05/23/2021 Discharge date: 06/03/2021  Admitted From: [facility]  Disposition:  [facility]  Recommendations for Outpatient Follow-up and new medication changes:  Follow up with Dr Neva Seat 7 to 10 days after his discharge form CIR.  Continue antibiotic therapy with Augmentin for 7 more days, to stop on 06/10/21.   Continue with aspiration precautions Patient has a IVC filter that needs to be removed once his bleeding risk is low enough to start patient on full anticoagulation for his DVT.  If patient continue to have swallow dysfunction will need a PEG tube.   I spoke with patient's wife (and family member over the phone) at the bedside, we talked in detail about patient's condition, plan of care and prognosis and all questions were addressed.   Home Health: na  Equipment/Devices: na    Discharge Condition: stable  CODE STATUS: full  Diet recommendation:  tube feedings.   Brief/Interim Summary: Mr. Chavarin was admitted to the hospital with the working diagnosis of acute worsening intracranial hemorrhage of the right frontal lobe.  Complicated with encephalopathy, aspiration pneumonia and severe sepsis.    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  Originally admitted to the hospital May 31 with intracranial hemorrhage of the posterior right frontal lobe.  He was transferred to inpatient rehab for further care.  While at the rehab he had acute change in his mentation with difficulty swallowing and facial droop.  Stat head CT showed right intraparenchymal hemorrhage with worsening midline shift.  He was transported to neuro ICU for further evaluation.  His blood pressure was 135/77, temperature 97.5, respiratory rate 18, oxygen saturation  100%, his lungs were clear to auscultation bilaterally, heart S1-S2, present, rhythmic, soft abdomen, no lower extremity edema.  Patient was responsive to noxious stimuli but not verbal or touch.  Not following commands.   Patient admitted to the neuro ICU, for further close neurologic monitoring.  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  Patient was placed on Keppra for seizure prophylaxis.  Medical treatment with hypertonic saline for intracranial hypertension.  Patient underwent cerebral angiogram with poor to no significant opacification of RT transverse sinus in this entirety.  Suspicion for thrombotic occlusion.  Grossly patent RT MCA and RT PCA branches with mass-effect on the RT ACA A2-A3 branches and RT MCA distal M3-M4 branches secondary to hematoma/edema. No AV shunting or AVM or aneurysm identified.  Further work up with MRV with no evidence of clot or venous sinus thrombosis.     He was found to have a right femoral DVT and an IVC filter was placed (06/15).     Plan to remove IVC filter when reduced bleeding risk and anticoagulation can be started. Family aware that after prolonged time filter can be source of further blood clots     He was diagnosed with left lower lobe pneumonia (not present on admission) and received antibiotic therapy with ceftriaxone.   Transferred to progressive care, continue to have severe swallow dysfunction. Has NG tube for nutrition.   Patient very somnolent and hyporeactive.    06/22 with signs of recurrent aspiration pneumonia, positive fever and worsening leukocytosis. Severe sepsis. Chest film with left lower infiltrate  Placed on Unasyn and discontinue keppra.  His sepsis syndrome and  mentation has improved.  Patient will be transferred back to CIR to continue rehab.   Intracranial hemorrhage right frontal lobe, complicated with toxic and metabolic acute encephalopathy. The source of  his original bleed still not clear, the recommendation is to repeat CTA head and MRI with and without contrast once hematoma resolves as an outpatient to rule out underlying source which is masked by current intra cerebral hemorrhage.  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  Currently he continues to have left-sided hemiparesis, but able to answer simple questions and follow simple commands with his right side.  He does have persistent swallow dysfunction and has an NG tube in place for nutrition. His Keppra has been discontinued and now he is on amantadine.  Plan to continue aggressive physical therapy at inpatient rehab, continue to assess his swallowing, if persistent high risk of aspiration will need a more permanent enteral port, PEG tube.   2.  Left femoral deep vein thrombosis.  Patient underwent ultrasonography of his lower extremities finding acute deep vein thrombosis involving the left common femoral vein. Because of recent intracranial bleed, not candidate for full anticoagulation.  A IVC filter was placed May 28, 2019.   So far his family have declined prophylactic enoxaparin. I spoke with Dr Pearlean Brownie and patient is safe to use enoxaparin prophylactic dose.  Advice his family to talk to Dr Pearlean Brownie as well about this issue in case they have more questions. Currently will continue to hold on enoxaparin prophylactic dose per his family request.   They have been made aware about risk of  developing Recurrent thromboembolism even in the presence of IVC filter.  3.  Left lower lobe aspiration pneumonia, recurrent, complicated with severe sepsis (endorgan damage encephalopathy), not present on admission.   Patient with significant swallow dysfunction, and high risk of aspiration. He developed a more severe aspiration pneumonia on his left lower lobe.  He has been placed on antibiotic therapy with Unasyn that will be transition to Augmentin at discharge. His white  cell count has been improving and his severe sepsis syndrome resolved.   4.  Prediabetes, hemoglobin A1c 5.8 complicated with hyperglycemia, moderate calorie protein malnutrition. Patient received insulin sliding scale along with basal for glucose control. He has been tolerating well tube feeds, continue to be n.p.o.  5.  Hypokalemia, hypernatremia, hyperchloremia, contraction metabolic alkalosis. Patient received supportive medical therapy including intravenous fluids during his hospitalization. Currently he is getting free water flushes and tube feeds.  Sodium has been improving, today 140, potassium 4.0, chloride 108, bicarb 26, glucose 200, BUN 19, creatinine 0.83.  6.  Dementia.  It is documented patient to have cognitive impairment, patient evaluated by [facility] neurology as an outpatient.  7. HTN. Blood pressure is 121/75 mmHg. Continue to hold on antihypertensive medications.   Discharge Diagnoses:  Principal Problem:   Intracerebral hemorrhage Active Problems:   Hypokalemia   Malnutrition of moderate degree   Seizure (HCC)   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HCC)   Right lower lobe pneumonia   Prediabetes   Hyperglycemia    Discharge Instructions   Allergies as of 06/03/2021       Reactions   Sulfa Antibiotics Other (See Comments)   Nausea and eye swelling        Medication List     STOP taking these medications    amLODipine 10 MG tablet Commonly known as: NORVASC   atorvastatin 20 MG tablet Commonly known as: Lipitor   hydrochlorothiazide 12.5 MG capsule  Commonly known as: MICROZIDE   MENS 50+ MULTI VITAMIN/MIN PO   pantoprazole 40 MG tablet Commonly known as: PROTONIX   polyethylene glycol 17 g packet Commonly known as: MIRALAX / GLYCOLAX   senna-docusate 8.6-50 MG tablet Commonly known as: Senokot-S       TAKE these medications    acetaminophen 160 MG/5ML solution Commonly known as: TYLENOL Place 20.3 mLs (650 mg total) into feeding tube every 4  (four) hours as needed for mild pain or fever.   amantadine 50 MG/5ML solution Commonly known as: SYMMETREL Take 10 mLs (100 mg total) by mouth 2 (two) times daily.   amoxicillin-clavulanate 250-62.5 MG/5ML suspension Commonly known as: AUGMENTIN Take 5 mLs (250 mg total) by mouth 2 (two) times daily for 7 days.   feeding supplement (OSMOLITE 1.5 CAL) Liqd Place 1,000 mLs into feeding tube continuous.   feeding supplement (PROSource TF) liquid Place 45 mLs into feeding tube daily. Start taking on: June 04, 2021   free water Soln Place 250 mLs into feeding tube every 6 (six) hours.   insulin aspart 100 UNIT/ML injection Commonly known as: novoLOG Inject 0-9 Units into the skin every 4 (four) hours. For glucose 121 to 150 use 1 unit, for 151-200 use 2 units, for 201-250 use 3 units, for 251 to 300 use 5 units, for 301 to 350 use 7 units, for 351 or greater use 9 units.   insulin glargine 100 UNIT/ML injection Commonly known as: LANTUS Inject 0.05 mLs (5 Units total) into the skin daily. Start taking on: June 04, 2021        Allergies  Allergen Reactions   Sulfa Antibiotics Other (See Comments)    Nausea and eye swelling     Consultations: Neurology  IR    Procedures/Studies: DG Abd 1 View  Result Date: 05/20/2021 CLINICAL DATA:  Abdominal pain. EXAM: ABDOMEN - 1 VIEW COMPARISON:  None. FINDINGS: Large amount of intestinal gas throughout small and large bowel suggesting ileus. There does not appear to be a large amount of fecal matter. No focal obstruction is evident. No free air. No abnormal calcifications or significant bone findings. IMPRESSION: Large amount of intestinal gas suggesting ileus. Electronically Signed   By: Paulina Fusi M.D.   On: 05/20/2021 19:25   CT HEAD WO CONTRAST  Result Date: 05/27/2021 CLINICAL DATA:  65 year old male with right hemisphere hemorrhage at code stroke presentation 05/11/2021. Subsequent encounter. EXAM: CT HEAD WITHOUT CONTRAST  TECHNIQUE: Contiguous axial images were obtained from the base of the skull through the vertex without intravenous contrast. COMPARISON:  Head CT 05/23/2021 and earlier. FINDINGS: Brain: Evolving right superior frontal lobe hemorrhage. Slowly fading hyperdense and mixed density blood products involving the superior and middle frontal gyri in an area of up to 48 x 35 x 50 mm (AP by transverse by CC). Continued regional edema and mass effect including effaced right lateral ventricle and leftward midline shift up to 7 mm at the midportion of the septum pellucidum. No ventriculomegaly. No intraventricular or significant extra-axial hemorrhage. Basilar cisterns remain patent. Stable gray-white matter differentiation elsewhere. No acute cortically based infarct. Vascular: Mild Calcified atherosclerosis at the skull base. Skull: Stable, negative. Sinuses/Orbits: Mild to moderate maxillary alveolar recess mucosal thickening. Otherwise paranasal sinuses and mastoids are stable and well aerated. Other: Right nasoenteric tube in place. Visualized orbits and scalp soft tissues are within normal limits. IMPRESSION: 1. Slowly evolving right superior frontal lobe hemorrhage, not significantly changed in size or configuration since 05/23/2021, with stable  regional edema and mass effect. 2. No new intracranial abnormality. Electronically Signed   By: Odessa Fleming M.D.   On: 05/27/2021 06:24   CT HEAD WO CONTRAST  Result Date: 05/23/2021 CLINICAL DATA:  Follow-up intracerebral hemorrhage EXAM: CT HEAD WITHOUT CONTRAST TECHNIQUE: Contiguous axial images were obtained from the base of the skull through the vertex without intravenous contrast. COMPARISON:  CT 05/23/2021 FINDINGS: Brain: No significant interval change in the overall size of the intraparenchymal hemorrhagic component seen in the right frontal with some extensive surrounding hypo attenuating edematous changes. Locoregional mass effect with sulcal effacement and effacement  of the right lateral ventricle as well as stable subfalcine herniation with a right to left midline shift of approximately 14 mm. Slight medialization of the right uncus without clear transtentorial herniation is also unchanged from prior. Basal cisterns remain patent. No new sites of hemorrhage. No new loss of gray-white differentiation. Vascular: No hyperdense vessel or unexpected calcification. Skull: Normal. Negative for fracture or focal lesion. EEG leads in place. Sinuses/Orbits: Mild mural thickening in the maxillary sinuses. Paranasal sinuses and mastoid air cells are otherwise predominantly clear. Included orbital structures are unremarkable. Other: None. IMPRESSION: 1. Stable appearance of the right frontal lobe intraparenchymal hemorrhage with surrounding cerebral edema, local mass effect as well as subfalcine midline shift of approximately 14 mm in slight medial ization of the right uncus, not significantly changed from comparison imaging. 2. No new sites of hemorrhage or new gray-white differentiation loss. Electronically Signed   By: Kreg Shropshire M.D.   On: 05/23/2021 23:58   CT HEAD WO CONTRAST  Result Date: 05/23/2021 CLINICAL DATA:  Follow-up right frontal parenchymal hemorrhage seen earlier today. EXAM: CT HEAD WITHOUT CONTRAST TECHNIQUE: Contiguous axial images were obtained from the base of the skull through the vertex without intravenous contrast. COMPARISON:  Earlier today. FINDINGS: Brain: No significant change in the previously described high right frontal lobe parenchymal hematoma with extensive surrounding edema and midline shift to the left. The midline shift currently measures 10 mm on image number 16/3. No interval ventricular enlargement. No new hemorrhage. Vascular: No hyperdense vessel or unexpected calcification. Skull: Normal. Negative for fracture or focal lesion. Sinuses/Orbits: Stable small left frontal sinus osteoma. Mild moderate left maxillary sinus mucosal thickening  without significant change. Unremarkable orbits. Other: Bilateral concha bullosa. IMPRESSION: 1. Stable large high right frontal parenchymal hematoma with extensive associated edema and midline shift to the left. 2. Stable left maxillary chronic sinusitis. Electronically Signed   By: Beckie Salts M.D.   On: 05/23/2021 18:00   CT HEAD WO CONTRAST  Result Date: 05/23/2021 CLINICAL DATA:  Cerebral hemorrhage, decreased responsiveness EXAM: CT HEAD WITHOUT CONTRAST TECHNIQUE: Contiguous axial images were obtained from the base of the skull through the vertex without intravenous contrast. COMPARISON:  Earlier same day FINDINGS: Brain: Parenchymal hematoma centered within the high right frontal lobe does not measure substantially changed from the prior study. Extensive surrounding edema is again noted with regional mass effect. Leftward midline shift measures similar at 14 mm at the level of the septum pellucidum. Ventricle caliber is unchanged with no new trapping. No significant central herniation. There is no new loss of gray-white differentiation. Intraventricular hemorrhage is identified. Vascular: No new findings. Skull: Unremarkable. Sinuses/Orbits: No acute finding. Other: None. IMPRESSION: No substantial change in right frontal parenchymal hemorrhage, edema, and mass effect including midline shift. No new ventricle trapping or acute infarction. Electronically Signed   By: Guadlupe Spanish M.D.   On: 05/23/2021 12:49  CT HEAD WO CONTRAST  Result Date: 05/11/2021 CLINICAL DATA:  Stroke.  Intracranial hemorrhage follow-up. EXAM: CT HEAD WITHOUT CONTRAST TECHNIQUE: Contiguous axial images were obtained from the base of the skull through the vertex without intravenous contrast. COMPARISON:  Same day head CT. FINDINGS: Brain: Mild interval increase in the size of the right frontal intraparenchymal hemorrhage, measuring 4.5 x 2.6 x 3.5 cm (previously 4.2 x 2.3 x 3.1 cm). Similar adjacent small volume subarachnoid  hemorrhage. A small amount of subdural hemorrhage in this region is also not excluded. Surrounding edema is similar. Similar local mass effect without midline shift. Basal cisterns are patent. No evidence of acute large vascular territory infarct elsewhere. Similar mild white matter hypodensities, likely chronic microvascular ischemic disease. No hydrocephalus. Vascular: No hyperdense vessel.  Calcific atherosclerosis. Skull: No acute fracture. Sinuses/Orbits: Small left frontal sinus osteoma. Mild right and moderate left maxillary sinus mucosal thickening. Unremarkable orbits. Other: No mastoid effusions. IMPRESSION: Mild interval increase in the size of the right frontal intraparenchymal hemorrhage, measuring 4.5 x 2.6 x 3.5 cm (previously 4.2 x 2.3 x 3.1 cm). Similar adjacent small volume hemorrhage. Electronically Signed   By: Feliberto Harts MD   On: 05/11/2021 17:05   MR BRAIN WO CONTRAST  Result Date: 05/12/2021 CLINICAL DATA:  65 year old male code stroke presentation with intra-axial right hemisphere hemorrhage. Hypertensive (194/104) on presentation. EXAM: MRI HEAD WITHOUT CONTRAST TECHNIQUE: Multiplanar, multiecho pulse sequences of the brain and surrounding structures were obtained without intravenous contrast. COMPARISON:  CT head CTA head and neck 05/11/2021 FINDINGS: Brain: Coronal T2 weighted imaging could not be obtained. And some of the exam is intermittently degraded by motion artifact despite repeated imaging attempts. T1 isointense intra-axial hemorrhage in the posterior right frontal lobe demonstrates a layering hematocrit level (series 13, image 19) and blood encompasses 43 by 38 x 33 mm (AP by transverse by CC) for an estimated volume of 26 mL. Regional edema. Mild regional mass effect. Trace subarachnoid hemorrhage also suspected on axial FLAIR imaging. Additionally, on SWI there is also chronic hemosiderin in the right parietal lobe on series 19 image 34. But no other definite chronic  blood products. Susceptibility related abnormal diffusion in the posterior right frontal lobe with no convincing larger area of restricted diffusion. No restricted diffusion elsewhere. No IVH or ventriculomegaly. Normal basilar cisterns. Negative pituitary and cervicomedullary junction. Wallace Cullens and white matter signal outside of the affected right hemisphere is largely normal for age with mild nonspecific white matter changes. Vascular: Major intracranial vascular flow voids are preserved. Skull and upper cervical spine: Visualized bone marrow signal is within normal limits. Grossly negative cervical spine. Sinuses/Orbits: Negative orbits. Mild to moderate maxillary sinus mucosal thickening. Other: Mastoids are clear. Grossly normal visible internal auditory structures. IMPRESSION: 1. Posterior right frontal lobe intra-axial hemorrhage with layering hematocrit level has not significantly changed (estimated blood volume of 26 mL). Surrounding edema and mild regional mass effect. Trace subarachnoid hemorrhage. 2. No larger underlying infarct is evident. But chronic hemosiderin in the right parietal lobe indicates a previous intra-axial hemorrhage in the region. 3. No other acute intracranial abnormality. Electronically Signed   By: Odessa Fleming M.D.   On: 05/12/2021 07:26   IR IVC FILTER PLMT / S&I Lenise Arena GUID/MOD SED  Result Date: 05/27/2021 INDICATION: 65 year old gentleman with lower extremity DVT and acute intracranial hemorrhage presents to IR for IVC filter placement EXAM: Retrievable IVC filter placement utilizing fluoroscopic and ultrasound guidance MEDICATIONS: None. ANESTHESIA/SEDATION: One mg IV Versed; 50 mcg IV Fentanyl  Moderate Sedation Time:  12 minutes The patient was continuously monitored during the procedure by the interventional radiology nurse under my direct supervision. FLUOROSCOPY TIME:  Fluoroscopy Time: 1 minutes 6 seconds (9 mGy). COMPLICATIONS: None immediate. PROCEDURE: Informed written consent  was obtained from the patient after a thorough discussion of the procedural risks, benefits and alternatives. All questions were addressed. Maximal Sterile Barrier Technique was utilized including caps, mask, sterile gowns, sterile gloves, sterile drape, hand hygiene and skin antiseptic. A timeout was performed prior to the initiation of the procedure. Patient positioned supine on the angiography table. Right neck prepped and draped in usual sterile fashion. All elements of maximal sterile barrier were utilized including, cap, mask, sterile gown, sterile gloves, large sterile drape, hand scrubbing and 2% Chlorhexidine for skin cleaning. The right internal jugular vein was evaluated with ultrasound and shown to be patent. A permanent ultrasound image was obtained and placed in the patient's medical record. Using sterile gel and a sterile probe cover, the right internal jugular vein was entered with a 21 ga needle during real time ultrasound guidance. 21 gauge needle exchanged for a transitional dilator set over 0.018 inch guidewire. Transitional dilator set exchanged for 10 fr sheath over 0.035 inch guidewire. Sheath placed to the infrarenal IVC and CO2 venogram performed to delineate the position of the renal veins. Retrievable Denali IVC filter deployed in the infrarenal location. Sheath removed and hemostasis achieved with manual compression. IMPRESSION: Retrievable Denali IVC filter placed. PLAN: This IVC filter is potentially retrievable. The patient will be assessed for filter retrieval by Interventional Radiology in approximately 8-12 weeks. Further recommendations regarding filter retrieval, continued surveillance or declaration of device permanence, will be made at that time. Electronically Signed   By: Acquanetta Belling M.D.   On: 05/27/2021 16:38   IR US Guide Vasc Access Right  Result Date: 05/27/2021 INDICATION: 65 year old gentleman with lower extremity DVT and acute intracranial hemorrhage presents to  IR for IVC filter placement EXAM: Retrievable IVC filter placement utilizing fluoroscopic and ultrasound guidance MEDICATIONS: None. ANESTHESIA/SEDATION: One mg IV Versed; 50 mcg IV Fentanyl Moderate Sedation Time:  12 minutes The patient was continuously monitored during the procedure by the interventional radiology nurse under my direct supervision. FLUOROSCOPY TIME:  Fluoroscopy Time: 1 minutes 6 seconds (9 mGy). COMPLICATIONS: None immediate. PROCEDURE: Informed written consent was obtained from the patient after a thorough discussion of the procedural risks, benefits and alternatives. All questions were addressed. Maximal Sterile Barrier Technique was utilized including caps, mask, sterile gowns, sterile gloves, sterile drape, hand hygiene and skin antiseptic. A timeout was performed prior to the initiation of the procedure. Patient positioned supine on the angiography table. Right neck prepped and draped in usual sterile fashion. All elements of maximal sterile barrier were utilized including, cap, mask, sterile gown, sterile gloves, large sterile drape, hand scrubbing and 2% Chlorhexidine for skin cleaning. The right internal jugular vein was evaluated with ultrasound and shown to be patent. A permanent ultrasound image was obtained and placed in the patient's medical record. Using sterile gel and a sterile probe cover, the right internal jugular vein was entered with a 21 ga needle during real time ultrasound guidance. 21 gauge needle exchanged for a transitional dilator set over 0.018 inch guidewire. Transitional dilator set exchanged for 10 fr sheath over 0.035 inch guidewire. Sheath placed to the infrarenal IVC and CO2 venogram performed to delineate the position of the renal veins. Retrievable Denali IVC filter deployed in the infrarenal location. Sheath removed and  hemostasis achieved with manual compression. IMPRESSION: Retrievable Denali IVC filter placed. PLAN: This IVC filter is potentially  retrievable. The patient will be assessed for filter retrieval by Interventional Radiology in approximately 8-12 weeks. Further recommendations regarding filter retrieval, continued surveillance or declaration of device permanence, will be made at that time. Electronically Signed   By: Acquanetta Belling M.D.   On: 05/27/2021 16:38   DG Chest Port 1 View  Result Date: 06/02/2021 CLINICAL DATA:  Increased white blood cell count.  Fever. EXAM: PORTABLE CHEST 1 VIEW COMPARISON:  May 31, 2021. FINDINGS: New medial left basilar/retrocardiac opacity. No visible pneumothorax or pleural effusions on this single semi erect radiograph. Cardiomediastinal silhouette is within normal limits and similar to prior. Enteric tube courses below the diaphragm with the tip outside the field of view. IMPRESSION: New medial left basilar/retrocardiac airspace opacity, which is suspicious for pneumonia given reported leukocytosis and fever. Electronically Signed   By: Feliberto Harts MD   On: 06/02/2021 13:57   DG Chest Port 1 View  Result Date: 05/31/2021 CLINICAL DATA:  Dyspnea EXAM: PORTABLE CHEST 1 VIEW COMPARISON:  Portable exam 0912 hours compared to 05/24/2021 FINDINGS: Feeding tube traverses esophagus and visualized stomach. Normal heart size, mediastinal contours, and pulmonary vascularity. Lungs clear. No pulmonary infiltrate, pleural effusion, or pneumothorax. Osseous structures unremarkable. IMPRESSION: No acute abnormalities. Electronically Signed   By: Ulyses Southward M.D.   On: 05/31/2021 10:54   DG CHEST PORT 1 VIEW  Result Date: 05/24/2021 CLINICAL DATA:  Leukocytosis EXAM: PORTABLE CHEST 1 VIEW COMPARISON:  04/28/15 FINDINGS: Cardiac shadow is within normal limits. Mild aortic calcifications are noted. The lungs are well aerated bilaterally with minimal left basilar atelectasis. No bony abnormality is seen. IMPRESSION: Minimal left basilar atelectasis. Electronically Signed   By: Alcide Clever M.D.   On: 05/24/2021  10:52   DG Abd Portable 1V  Result Date: 05/24/2021 CLINICAL DATA:  Feeding tube placement EXAM: PORTABLE ABDOMEN - 1 VIEW COMPARISON:  05/20/2021 FINDINGS: Limited radiograph of the lower chest and upper abdomen was obtained for the purposes of enteric tube localization. Enteric tube is seen coursing below the diaphragm with distal tip terminating within the expected location of the gastric body. Air-filled large and small bowel loops throughout the abdomen, similar to prior. IMPRESSION: Enteric tube tip terminates within the gastric body. Electronically Signed   By: Duanne Guess D.O.   On: 05/24/2021 13:50   EEG adult  Result Date: 05/23/2021 Charlsie Quest, MD     05/23/2021  4:11 PM Patient Name: Cortez Flippen MRN: [id number] Epilepsy Attending: Charlsie Quest Referring Physician/Provider: Dr Bing Neighbors Date: 05/23/2021 Duration: 25.36 mins Patient history:  65 y.o. male currently admitted for ICH on 05/11/21 developed somnolence and aphasia with R gaze deviation that resolved with ativan. EEG to evaluate for seizure Level of alertness: asleep AEDs during EEG study: LEV Technical aspects: This EEG study was done with scalp electrodes positioned according to the 10-20 International system of electrode placement. Electrical activity was acquired at a sampling rate of  and reviewed with a high frequency filter of  and a low frequency filter of . EEG data were recorded continuously and digitally stored. Description: No posterior dominant rhythm was seen. Sleep was characterized by sleep spindles (12 to 14 Hz), maximal frontocentral region. EEG showed continuous generalized 3 to 6 Hz theta-delta slowing. There is an excessive amount of 15 to 18 Hz, 2-3 uV beta activity distributed symmetrically and diffusely.  Hyperventilation and photic  stimulation were not performed.   ABNORMALITY - Continuous slow, generalized - Excessive beta, generalized IMPRESSION: This study is suggestive of  moderate to severe diffuse encephalopathy, nonspecific etiology. The excessive beta activity seen in the background is most likely due to the effect of benzodiazepine and is a benign EEG pattern. No seizures or epileptiform discharges were seen throughout the recording. Priyanka Annabelle Harman   Overnight EEG with video  Result Date: 05/24/2021 Charlsie Quest, MD     05/25/2021  9:42 AM Patient Name: Trevian Hayashida MRN: [id number] Epilepsy Attending: Charlsie Quest Referring Physician/Provider: Dr Bing Neighbors Duration: 05/23/2021 1655 to 05/24/2021 1655  Patient history:  65 y.o. male currently admitted for ICH on 05/11/21 developed somnolence and aphasia with R gaze deviation that resolved with ativan. EEG to evaluate for seizure  Level of alertness: awake, asleep  AEDs during EEG study: LEV  Technical aspects: This EEG study was done with scalp electrodes positioned according to the 10-20 International system of electrode placement. Electrical activity was acquired at a sampling rate of 500Hz  and reviewed with a high frequency filter of 70Hz  and a low frequency filter of 1Hz . EEG data were recorded continuously and digitally stored.  Description: No posterior dominant rhythm was seen. Sleep was characterized by sleep spindles (12 to 14 Hz), asymmetry ( R<L) maximal frontocentral region. EEG showed continuous generalized and lateralized right hemisphere 3 to 6 Hz theta-delta slowing. Hyperventilation and photic stimulation were not performed.    ABNORMALITY - Continuous slow, generalized and lateralized right hemisphere - Spindle asymmetry, right<left  IMPRESSION: This study is suggestive of cortical dysfunction in right hemisphere likely secondary to underlying structural abnormality.  Additionally there is moderate to severe diffuse encephalopathy, nonspecific etiology. No seizures or definite epileptiform discharges were seen throughout the recording.   Charlsie Quest   MR MRV HEAD W WO CONTRAST  Result  Date: 05/30/2021 CLINICAL DATA:  Right parenchymal hemorrhage.  Abnormal arteriogram. EXAM: MR VENOGRAM HEAD WITHOUT AND WITH CONTRAST TECHNIQUE: Angiographic images of the intracranial venous structures were acquired using MRV technique without and with intravenous contrast. CONTRAST:  6.70mL GADAVIST GADOBUTROL 1 MMOL/ML IV SOLN COMPARISON:  Cerebral arteriogram 05/28/2021. MR venogram 05/12/2021. FINDINGS: Stable appearance of dominant left transverse sinus. Postcontrast images demonstrate no thrombus. Straight sinus and deep cerebral veins are intact. Cortical veins are unremarkable. Superior sagittal sinus normal. Sagittal sinus and intra on the jugular veins are normal bilaterally. IMPRESSION: Normal variant MR venogram without significant change. No sinus thrombosis. Electronically Signed   By: Marin Roberts M.D.   On: 05/30/2021 12:49   MR MRV HEAD W WO CONTRAST  Result Date: 05/12/2021 CLINICAL DATA:  Intracranial hemorrhage. EXAM: MR VENOGRAM HEAD WITHOUT AND WITH CONTRAST TECHNIQUE: Angiographic images of the intracranial venous structures were acquired using MRV technique without and with intravenous contrast. CONTRAST:  7mL GADAVIST GADOBUTROL 1 MMOL/ML IV SOLN COMPARISON:  Brain MRI performed earlier today 05/12/2021. Noncontrast head CT examinations 05/11/2021. CT angiogram head/neck 05/11/2021. FINDINGS: The superior sagittal sinus, internal cerebral veins, vein of Galen, straight sinus, transverse sinuses, sigmoid sinuses and visualized jugular veins are patent. No appreciable intracranial venous thrombosis. Hypoplastic right transverse and sigmoid dural venous sinuses. An acute parenchymal hemorrhage within the posterior right frontal lobe is grossly unchanged in size as compared to the brain MRI performed earlier today. There is mild curvilinear vascular enhancement overlying the site of hemorrhage, which may be reactive. Elsewhere, no abnormal intracranial enhancement is identified.  IMPRESSION: No evidence of intracranial venous  thrombosis. Non dominant right transverse and sigmoid dural venous sinuses. Electronically Signed   By: Jackey Loge DO   On: 05/12/2021 11:27   ECHOCARDIOGRAM COMPLETE  Result Date: 05/11/2021    ECHOCARDIOGRAM REPORT   Patient Name:   [facility] Date of Exam: 05/11/2021 Medical Rec #:  [id number]        Height:       68.0 in Accession #:    [id number]       Weight:       155.2 lb Date of Birth:  [birth year]         BSA:          1.835 m Patient Age:    65 years         BP:           115/77 mmHg Patient Gender: M                HR:           84 bpm. Exam Location:  Inpatient Procedure: 2D Echo, Cardiac Doppler and Color Doppler Indications:    Stroke I63.9  History:        Patient has no prior history of Echocardiogram examinations.  Sonographer:    Roosvelt Maser RDCS Referring Phys: [id number] Reyne Dumas [facility] IMPRESSIONS  1. Left ventricular ejection fraction, by estimation, is 65 to 70%. The left ventricle has normal function. The left ventricle has no regional wall motion abnormalities. Left ventricular diastolic parameters were normal.  2. Right ventricular systolic function is normal. The right ventricular size is normal. Tricuspid regurgitation signal is inadequate for assessing PA pressure.  3. The mitral valve is normal in structure. No evidence of mitral valve regurgitation.  4. The aortic valve was not well visualized. Aortic valve regurgitation is not visualized. No aortic stenosis is present.  5. The inferior vena cava is normal in size with greater than 50% respiratory variability, suggesting right atrial pressure of 3 mmHg. FINDINGS  Left Ventricle: Left ventricular ejection fraction, by estimation, is 65 to 70%. The left ventricle has normal function. The left ventricle has no regional wall motion abnormalities. The left ventricular internal cavity size was normal in size. There is  no left ventricular hypertrophy. Left ventricular diastolic parameters  were normal. Right Ventricle: The right ventricular size is normal. No increase in right ventricular wall thickness. Right ventricular systolic function is normal. Tricuspid regurgitation signal is inadequate for assessing PA pressure. Left Atrium: Left atrial size was normal in size. Right Atrium: Right atrial size was normal in size. Pericardium: There is no evidence of pericardial effusion. Mitral Valve: The mitral valve is normal in structure. No evidence of mitral valve regurgitation. Tricuspid Valve: The tricuspid valve is normal in structure. Tricuspid valve regurgitation is not demonstrated. Aortic Valve: The aortic valve was not well visualized. Aortic valve regurgitation is not visualized. No aortic stenosis is present. Aortic valve mean gradient measures 3.0 mmHg. Aortic valve peak gradient measures 6.7 mmHg. Aortic valve area, by VTI measures 2.77 cm. Pulmonic Valve: The pulmonic valve was not well visualized. Pulmonic valve regurgitation is not visualized. Aorta: The aortic root and ascending aorta are structurally normal, with no evidence of dilitation. Venous: The inferior vena cava is normal in size with greater than 50% respiratory variability, suggesting right atrial pressure of 3 mmHg. IAS/Shunts: The interatrial septum was not well visualized.  LEFT VENTRICLE PLAX 2D LVIDd:         4.90 cm  Diastology LVIDs:  2.80 cm  LV e' medial:    8.38 cm/s LV PW:         0.80 cm  LV E/e' medial:  9.0 LV IVS:        0.90 cm  LV e' lateral:   9.14 cm/s LVOT diam:     1.90 cm  LV E/e' lateral: 8.2 LV SV:         69 LV SV Index:   37 LVOT Area:     2.84 cm  RIGHT VENTRICLE RV Basal diam:  2.90 cm LEFT ATRIUM           Index       RIGHT ATRIUM           Index LA diam:      3.10 cm 1.69 cm/m  RA Area:     11.20 cm LA Vol (A2C): 26.8 ml 14.61 ml/m RA Volume:   21.90 ml  11.94 ml/m LA Vol (A4C): 39.2 ml 21.36 ml/m  AORTIC VALVE AV Area (Vmax):    2.64 cm AV Area (Vmean):   2.59 cm AV Area (VTI):      2.77 cm AV Vmax:           129.00 cm/s AV Vmean:          86.700 cm/s AV VTI:            0.248 m AV Peak Grad:      6.7 mmHg AV Mean Grad:      3.0 mmHg LVOT Vmax:         120.00 cm/s LVOT Vmean:        79.200 cm/s LVOT VTI:          0.242 m LVOT/AV VTI ratio: 0.98  AORTA Ao Root diam: 2.50 cm Ao Asc diam:  3.30 cm MITRAL VALVE MV Area (PHT): 3.60 cm     SHUNTS MV Decel Time: 211 msec     Systemic VTI:  0.24 m MV E velocity: 75.30 cm/s   Systemic Diam: 1.90 cm MV A velocity: 107.00 cm/s MV E/A ratio:  0.70 Epifanio Lesches MD Electronically signed by Epifanio Lesches MD Signature Date/Time: 05/11/2021/5:28:15 PM    Final    CT HEAD CODE STROKE WO CONTRAST  Result Date: 05/23/2021 CLINICAL DATA:  Code stroke.  Acute stroke. EXAM: CT HEAD WITHOUT CONTRAST TECHNIQUE: Contiguous axial images were obtained from the base of the skull through the vertex without intravenous contrast. COMPARISON:  Head CT from 05/11/2021 FINDINGS: Brain: 5.5 x 2.8 cm hematoma in the subcortical high right frontal region with extensive vasogenic edema in the adjacent brain that is progressed. There is worsening local mass effect with midline shift now measuring 13 mm. No entrapment. No cytotoxic edema seen. Vascular: No hyperdense vessel or unexpected calcification. Skull: Normal. Negative for fracture or focal lesion. Sinuses/Orbits: Negative Other: Critical Value/emergent results were called by telephone at the time of interpretation on 05/23/2021 at 9:10 am to provider [facility] , who verbally acknowledged these results. ASPECTS [facility] Stroke Program Early CT Score) Not scored in this setting IMPRESSION: Progressed right frontal hematoma and vasogenic edema with midline shift now measuring 13 mm. Electronically Signed   By: Marnee Spring M.D.   On: 05/23/2021 09:12   CT HEAD CODE STROKE WO CONTRAST  Result Date: 05/11/2021 CLINICAL DATA:  Code stroke. Left-sided weakness, nausea, and vomiting. EXAM: CT HEAD WITHOUT  CONTRAST TECHNIQUE: Contiguous axial images were obtained from the base of the skull through the vertex  without intravenous contrast. COMPARISON:  None. FINDINGS: Brain: An acute parenchymal hemorrhage in the high posterior right frontal lobe measures 4.2 x 2.3 x 3.1 cm (approximate volume of 15 mL). There is mild surrounding edema without midline shift, and there is a small amount of adjacent subarachnoid hemorrhage. A very small amount of coexistent subdural hemorrhage is also not excluded in this region. No acute infarct is identified elsewhere. The ventricles are normal in size. Hypodensities in the cerebral white matter bilaterally are nonspecific but compatible with mild chronic small vessel ischemic disease. Vascular: No hyperdense vessel. Skull: No fracture or suspicious osseous lesion. Sinuses/Orbits: Small osteoma in the left frontal sinus. Mild right and moderate left maxillary sinus mucosal thickening. Clear mastoid air cells. Unremarkable orbits. Other: None. ASPECTS [facility] Stroke Program Early CT Score) Not scored in the presence of acute hemorrhage. IMPRESSION: Acute parenchymal hemorrhage in the posterior right frontal lobe with small amount of adjacent subarachnoid hemorrhage. These results were communicated to Dr. Iver Nestle at 9:08 am on 05/11/2021 by text page via the [facility] messaging system. Electronically Signed   By: Sebastian Ache M.D.   On: 05/11/2021 09:12   VAS [place] LOWER EXTREMITY VENOUS (DVT)  Result Date: 05/26/2021  Lower Venous DVT Study Patient Name:  KHALED HERDA  Date of Exam:   05/26/2021 Medical Rec #: [id number]         Accession #:    [id number] Date of Birth: [birth year]          Patient Gender: M Patient Age:   65Y Exam Location:  [facility] Procedure:      VAS [place] LOWER EXTREMITY VENOUS (DVT) Referring Phys: [id number] Eliezer Champagne --------------------------------------------------------------------------------  Indications: Fever.  Limitations: Poor ultrasound/tissue  interface. Comparison Study: No previous exams. Performing Technologist: Ernestene Mention RVT, RDMS  Examination Guidelines: A complete evaluation includes B-mode imaging, spectral Doppler, color Doppler, and power Doppler as needed of all accessible portions of each vessel. Bilateral testing is considered an integral part of a complete examination. Limited examinations for reoccurring indications may be performed as noted. The reflux portion of the exam is performed with the patient in reverse Trendelenburg.  +---------+---------------+---------+-----------+----------+--------------+ RIGHT    CompressibilityPhasicitySpontaneityPropertiesThrombus Aging +---------+---------------+---------+-----------+----------+--------------+ CFV      Full           Yes      Yes                                 +---------+---------------+---------+-----------+----------+--------------+ SFJ      Full                                                        +---------+---------------+---------+-----------+----------+--------------+ FV Prox  Full           Yes      Yes                                 +---------+---------------+---------+-----------+----------+--------------+ FV Mid   Full           Yes      Yes                                 +---------+---------------+---------+-----------+----------+--------------+  FV DistalFull           Yes      Yes                                 +---------+---------------+---------+-----------+----------+--------------+ PFV      Full                                                        +---------+---------------+---------+-----------+----------+--------------+ POP      Full           Yes      Yes                                 +---------+---------------+---------+-----------+----------+--------------+ PTV      Full                                                         +---------+---------------+---------+-----------+----------+--------------+ PERO     Full                                                        +---------+---------------+---------+-----------+----------+--------------+   +---------+---------------+---------+-----------+----------+-------------------+ LEFT     CompressibilityPhasicitySpontaneityPropertiesThrombus Aging      +---------+---------------+---------+-----------+----------+-------------------+ CFV      Partial        Yes      Yes                  Acute- non                                                                occlusive           +---------+---------------+---------+-----------+----------+-------------------+ SFJ      Full                                                             +---------+---------------+---------+-----------+----------+-------------------+ FV Prox  Full           Yes      Yes                                      +---------+---------------+---------+-----------+----------+-------------------+ FV Mid   Full           Yes      Yes                                      +---------+---------------+---------+-----------+----------+-------------------+  FV DistalFull           Yes      Yes                                      +---------+---------------+---------+-----------+----------+-------------------+ PFV      Full                                                             +---------+---------------+---------+-----------+----------+-------------------+ POP      Full           Yes      Yes                                      +---------+---------------+---------+-----------+----------+-------------------+ PTV                                                   Not well visualized +---------+---------------+---------+-----------+----------+-------------------+ PERO     Full                                                              +---------+---------------+---------+-----------+----------+-------------------+ EIV                     Yes      Yes                  Patent by color and                                                       doppler             +---------+---------------+---------+-----------+----------+-------------------+     Summary: BILATERAL: - No evidence of superficial venous thrombosis in the lower extremities, bilaterally. -No evidence of popliteal cyst, bilaterally. RIGHT: - There is no evidence of deep vein thrombosis in the lower extremity.  LEFT: - Findings consistent with acute deep vein thrombosis involving the left common femoral vein.  *See table(s) above for measurements and observations. Electronically signed by Coral ElseVance Brabham MD on 05/26/2021 at 7:50:00 PM.    Final    CT ANGIO HEAD NECK W WO CM (CODE STROKE)  Result Date: 05/23/2021 CLINICAL DATA:  Enlarging hematoma EXAM: CT ANGIOGRAPHY HEAD AND NECK TECHNIQUE: Multidetector CT imaging of the head and neck was performed using the standard protocol during bolus administration of intravenous contrast. Multiplanar CT image reconstructions and MIPs were obtained to evaluate the vascular anatomy. Carotid stenosis measurements (when applicable) are obtained utilizing NASCET criteria, using the distal internal carotid diameter as the denominator. CONTRAST:  50mL OMNIPAQUE IOHEXOL 350 MG/ML SOLN COMPARISON:  CTA 05/11/2021 FINDINGS: CTA NECK FINDINGS  Aortic arch: Normal Right carotid system: Widely patent vessels with smooth contour. No atheromatous changes Left carotid system: Widely patent vessels with smooth contour. No noted atheromatous changes Vertebral arteries: No proximal subclavian stenosis. The vertebral arteries are widely patent. No suspected vertebral beading when allowing for streak artifact. Skeleton: Multilevel degenerative disc narrowing and ridging canal and foraminal encroachment the lower cervical levels. Other neck: No evidence of  inflammation or mass. Upper chest: Negative Review of the MIP images confirms the above findings CTA HEAD FINDINGS Anterior circulation: Major vessels are smooth and widely patent. No branch occlusion, generalized beading, or aneurysm. There is a cortical branch at the right frontal parietal convexity which is not continuous beyond the level of the upper and lateral hematoma. No visible nidus or early enhancing veins. Posterior circulation: Vertebral and basilar arteries are smooth and widely patent. No branch occlusion, beading, or aneurysm. Venous sinuses: MRV was obtained previously. The veins are largely nonenhancing on this study. Anatomic variants: None significant Review of the MIP images confirms the above findings IMPRESSION: Seemingly truncated arterial cortical branch at the upper and lateral hematoma, without discrete vascular malformation or spot sign. Recommend follow-up after resolution of mass effect to evaluate for small AVM. Electronically Signed   By: Marnee Spring M.D.   On: 05/23/2021 09:54   CT ANGIO HEAD NECK W WO CM (CODE STROKE)  Result Date: 05/11/2021 CLINICAL DATA:  Stroke follow-up.  Left-sided weakness. EXAM: CT ANGIOGRAPHY HEAD AND NECK TECHNIQUE: Multidetector CT imaging of the head and neck was performed using the standard protocol during bolus administration of intravenous contrast. Multiplanar CT image reconstructions and MIPs were obtained to evaluate the vascular anatomy. Carotid stenosis measurements (when applicable) are obtained utilizing NASCET criteria, using the distal internal carotid diameter as the denominator. CONTRAST:  50mL OMNIPAQUE IOHEXOL 350 MG/ML SOLN COMPARISON:  Same day CT head. FINDINGS: CTA NECK FINDINGS Aortic arch: Great vessel origins are patent. Right carotid system: No evidence of dissection, stenosis (50% or greater) or occlusion. Mild apparent irregularity of the ICA at the skull base in a region of streak artifact. Left carotid system: No  evidence of dissection, stenosis (50% or greater) or occlusion. Mild apparent irregularity of the ICA at the skull base in a region of streak artifact. Vertebral arteries: Codominant. No evidence of dissection, stenosis (50% or greater) or occlusion. Skeleton: Moderate degenerative disc disease at at C5-C6 and C6-C7 with disc height loss, endplate sclerosis and posterior disc osteophyte complexes. Other neck: No acute abnormality Upper chest: Visualized lung apices are clear. Review of the MIP images confirms the above findings CTA HEAD FINDINGS Anterior circulation: No large vessel occlusion or proximal hemodynamically significant stenosis. Evaluation of the distal vasculature is limited due to venous contamination. No evidence of and arteriovenous malformation or aneurysm in the region of intraparenchymal hemorrhage, although acute blood products limit evaluation. Posterior circulation: No large vessel occlusion or proximal hemodynamically significant stenosis. Venous sinuses: The superior sagittal sinus is narrowed in the region of hemorrhage, most likely from mass effect. Small right transverse and sigmoid sinuses. Review of the MIP images confirms the above findings IMPRESSION: CTA Head: 1. No large vessel occlusion or hemodynamically significant proximal arterial stenosis. 2. The superior sagittal sinus is narrowed in the region of hemorrhage with poorly visualized draining cortical veins in this region. While indeterminate, this may be secondary to mass effect given no definite/clear intraluminal thrombus. Given these findings and the location of hemorrhage; however, recommend low threshold for follow-up CTV or MRI  with contrast to ensure stability and exclude worsening thrombosis. 3. No evidence of and arteriovenous malformation or aneurysm in the region of intraparenchymal hemorrhage, although acute blood products limit evaluation. Follow-up imaging after resolution of hemorrhage could provide further  assessment if clinically indicated. CTA Neck: 1. No significant (greater than 50%) stenosis. 2. Mild apparent irregularity of bilateral ICAs at the skull base is favored artifactual given streak artifact from dental amalgam in this region. Fibromuscular dysplasia is a differential consideration. Findings and recommendations discussed with Dr. Iver Nestle via telephone at 9:59 a.m. 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 EXAM: BILATERAL COMMON CAROTID AND INNOMINATE ANGIOGRAPHY COMPARISON:  CT angiogram of the head and neck of May 23, 2021, and May 11, 2021. MEDICATIONS: Heparin none. No antibiotic was administered within 1 hour of the procedure. ANESTHESIA/SEDATION: Versed 1.5 mg IV; Fentanyl 37.5 mcg IV Moderate Sedation Time:  52 minutes The patient was continuously monitored during the procedure by the interventional radiology nurse under my direct supervision. CONTRAST:  Isovue 300 approximately 80 mL. FLUOROSCOPY TIME:  Fluoroscopy Time: 15 minutes 24 seconds (1503 mGy). COMPLICATIONS: None immediate. TECHNIQUE: Informed written consent was obtained from the patient after a thorough discussion of the procedural risks, benefits and alternatives. All questions were addressed. Maximal Sterile Barrier Technique was utilized including caps, mask, sterile gowns, sterile gloves, sterile drape, hand hygiene and skin antiseptic. A timeout was performed prior to the initiation of the procedure. The right groin was prepped and draped in the usual sterile fashion. Thereafter using modified Seldinger technique, transfemoral access into the right common femoral artery was obtained without difficulty. Over a 0.035 inch guidewire, a 5 French Pinnacle sheath was inserted. Through this, and also over 0.035 inch guidewire, a 5 [place]  JB 1 catheter was advanced to the aortic arch region and selectively positioned in the right common carotid artery, the right vertebral artery, the left common carotid artery and the left vertebral artery. FINDINGS: The right vertebral artery origin is widely patent. The vessel is seen to opacify to the cranial skull base. Wide opacification is seen of right vertebrobasilar junction and the right posterior-inferior cerebellar artery. The basilar artery, the posterior cerebral arteries, the superior cerebellar arteries and the anterior-inferior cerebellar arteries opacify into the capillary and venous phases. Venous phase demonstrates non opacification of the right transverse sinus extending from the right lateral origin of the torcula to the transverse sigmoid junction. Retrograde opacification of the sigmoid sinus is seen from the superior cerebellar veins with retrograde flow into the right transverse sinus sigmoid junction. The right common carotid arteriogram demonstrates the right external carotid artery and its major branches to be widely patent. The right internal carotid artery at the bulb to the cranial skull base is widely patent. The petrous, the cavernous and the supraclinoid segments are widely patent. The right middle cerebral artery is patent into the anterior temporal branch and the bifurcation branches. Bundling of the right middle cerebral artery M3 M4 branch is noted secondary to the superiorly and the medially located intracranial hemorrhage. The right anterior cerebral artery opacifies into the capillary and venous phases. The venous phase demonstrates a minimally opacified right transverse sinus. The delayed lateral projection of the DSA demonstrates delayed clearance of the arterial portion of the contrast compared to the venous. The left  common carotid arteriogram demonstrates the left external carotid artery and its major branches to be widely patent. The left internal carotid artery at the  bulb to the cranial skull base is widely patent. The petrous, the cavernous and the supraclinoid segments are widely patent. The left middle cerebral and the left anterior cerebral artery opacify into the capillary and venous phases. Left vertebral origin is widely patent. The vessel is seen to opacify to the cranial skull base. The left vertebrobasilar junction and the left posterior-inferior cerebral artery demonstrate wide patency. The basilar artery, and the opacified portions of the posterior cerebral arteries, the superior cerebellar arteries and the anterior-inferior cerebellar arteries is seen into the capillary and the venous phases. The venous phase again demonstrates non opacification of the right transverse sinus with suggestion of small dural veins. Again demonstrated is the retrograde opacification of the right sigmoid sinus transverse sinus junction from the ipsilateral superior cerebellar venous structures. IMPRESSION: Non opacification of the right transverse sinus extending from the torcula to the right transverse sinus/sigmoid sinus junction with retrograde opacification of the right sigmoid sinus from the superior cerebellar veins on the on the right side. Above findings supportive of venous hypertension with delayed clearance of contrast from the right cerebral hemisphere, and also with retrograde opacification of the right sigmoid sinus from the right superior cerebellar hemispheric veins. Attenuated caliber of the right sigmoid sinus noted though the right internal jugular vein appears comparable in size compared to the contralateral left side. Angiographically no evidence of intracranial aneurysm arteriovenous shunting, or of arteriovenous malformation or dissections. PLAN: Follow-up MRA of the brain, and MRV of the dural venous sinuses with and without contrast in 3 months. Electronically Signed   By: Julieanne Cotton M.D.   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 EXAM: BILATERAL COMMON CAROTID AND INNOMINATE ANGIOGRAPHY COMPARISON:  CT angiogram of the head and neck of May 23, 2021, and May 11, 2021. MEDICATIONS: Heparin none. No antibiotic was administered within 1 hour of the procedure. ANESTHESIA/SEDATION: Versed 1.5 mg IV; Fentanyl 37.5 mcg IV Moderate Sedation Time:  52 minutes The patient was continuously monitored during the procedure by the interventional radiology nurse under my direct supervision. CONTRAST:  Isovue 300 approximately 80 mL. FLUOROSCOPY TIME:  Fluoroscopy Time: 15 minutes 24 seconds (1503 mGy). COMPLICATIONS: None immediate. TECHNIQUE: Informed written consent was obtained from the patient after a thorough discussion of the procedural risks, benefits and alternatives. All questions were addressed. Maximal Sterile Barrier Technique was utilized including caps, mask, sterile gowns, sterile gloves, sterile drape, hand hygiene and skin antiseptic. A timeout was performed prior to the initiation of the procedure. The right groin was prepped and draped in the usual sterile fashion. Thereafter using modified Seldinger technique, transfemoral access into the right common femoral artery was obtained without difficulty. Over a 0.035 inch guidewire, a 5 French Pinnacle sheath was inserted. Through this, and also over 0.035 inch guidewire, a 5 [place] JB 1 catheter was advanced to the aortic arch region and selectively positioned in the right common carotid artery, the right vertebral artery, the left common carotid artery and the left vertebral artery. FINDINGS: The right vertebral artery origin is widely patent. The vessel is seen to opacify to the cranial skull base. Wide opacification is seen of right vertebrobasilar junction and the right posterior-inferior cerebellar artery. The basilar artery, the posterior cerebral  arteries, the superior  cerebellar arteries and the anterior-inferior cerebellar arteries opacify into the capillary and venous phases. Venous phase demonstrates non opacification of the right transverse sinus extending from the right lateral origin of the torcula to the transverse sigmoid junction. Retrograde opacification of the sigmoid sinus is seen from the superior cerebellar veins with retrograde flow into the right transverse sinus sigmoid junction. The right common carotid arteriogram demonstrates the right external carotid artery and its major branches to be widely patent. The right internal carotid artery at the bulb to the cranial skull base is widely patent. The petrous, the cavernous and the supraclinoid segments are widely patent. The right middle cerebral artery is patent into the anterior temporal branch and the bifurcation branches. Bundling of the right middle cerebral artery M3 M4 branch is noted secondary to the superiorly and the medially located intracranial hemorrhage. The right anterior cerebral artery opacifies into the capillary and venous phases. The venous phase demonstrates a minimally opacified right transverse sinus. The delayed lateral projection of the DSA demonstrates delayed clearance of the arterial portion of the contrast compared to the venous. The left common carotid arteriogram demonstrates the left external carotid artery and its major branches to be widely patent. The left internal carotid artery at the bulb to the cranial skull base is widely patent. The petrous, the cavernous and the supraclinoid segments are widely patent. The left middle cerebral and the left anterior cerebral artery opacify into the capillary and venous phases. Left vertebral origin is widely patent. The vessel is seen to opacify to the cranial skull base. The left vertebrobasilar junction and the left posterior-inferior cerebral artery demonstrate wide patency. The basilar artery, and the opacified portions of the posterior  cerebral arteries, the superior cerebellar arteries and the anterior-inferior cerebellar arteries is seen into the capillary and the venous phases. The venous phase again demonstrates non opacification of the right transverse sinus with suggestion of small dural veins. Again demonstrated is the retrograde opacification of the right sigmoid sinus transverse sinus junction from the ipsilateral superior cerebellar venous structures. IMPRESSION: Non opacification of the right transverse sinus extending from the torcula to the right transverse sinus/sigmoid sinus junction with retrograde opacification of the right sigmoid sinus from the superior cerebellar veins on the on the right side. Above findings supportive of venous hypertension with delayed clearance of contrast from the right cerebral hemisphere, and also with retrograde opacification of the right sigmoid sinus from the right superior cerebellar hemispheric veins. Attenuated caliber of the right sigmoid sinus noted though the right internal jugular vein appears comparable in size compared to the contralateral left side. Angiographically no evidence of intracranial aneurysm arteriovenous shunting, or of arteriovenous malformation or dissections. PLAN: Follow-up MRA of the brain, and MRV of the dural venous sinuses with and without contrast in 3 months. Electronically Signed   By: Julieanne Cotton M.D.   On: 05/28/2021 16:01     Procedures: Cerebral angiogram IVC filter.   Subjective: Patient is looking better today, he is more responsive denies any dyspnea or pain.   Discharge Exam: Vitals:   06/03/21 0749 06/03/21 1128  BP: 118/70 121/75  Pulse: 92 91  Resp: 16 18  Temp: 98.6 F (37 C) 98.2 F (36.8 C)  SpO2: 99% 100%   Vitals:   06/02/21 1943 06/03/21 0145 06/03/21 0749 06/03/21 1128  BP: 131/73 122/77 118/70 121/75  Pulse: 97 98 92 91  Resp: 19 16 16 18   Temp: 99.3 F (37.4 C)  98.6  F (37 C) 98.2 F (36.8 C)  TempSrc: Oral  Oral  Oral  SpO2: 99% 94% 99% 100%  Weight:        General: Not in pain or dyspnea, deconditioned  Neurology: awake, responds with yes and no to simple questions and follows simple commands. Continue to have left sided hemiparesis.  E ENT: no pallor, no icterus, oral mucosa moist Cardiovascular: No JVD. S1-S2 present, rhythmic, no gallops, rubs, or murmurs. No lower extremity edema. Pulmonary: positive breath sounds bilaterally, adequate air movement, no wheezing, rhonchi or rales. Gastrointestinal. Abdomen soft and non tender Skin. No rashes Musculoskeletal: no joint deformities   The results of significant diagnostics from this hospitalization (including imaging, microbiology, ancillary and laboratory) are listed below for reference.     Microbiology: Recent Results (from the past 240 hour(s))  Culture, Urine     Status: Abnormal   Collection Time: 05/26/21  8:58 AM   Specimen: Urine, Catheterized  Result Value Ref Range Status   Specimen Description URINE, CATHETERIZED  Final   Special Requests   Final    Normal Performed at [facility], [address], [place], [place] [address]    Culture 30,000 COLONIES/mL STAPHYLOCOCCUS AUREUS (A)  Final   Report Status 05/28/2021 FINAL  Final   Organism ID, Bacteria STAPHYLOCOCCUS AUREUS (A)  Final      Susceptibility   Staphylococcus aureus - MIC*    CIPROFLOXACIN <=0.5 SENSITIVE Sensitive     GENTAMICIN <=0.5 SENSITIVE Sensitive     NITROFURANTOIN 32 SENSITIVE Sensitive     OXACILLIN <=0.25 SENSITIVE Sensitive     TETRACYCLINE <=1 SENSITIVE Sensitive     VANCOMYCIN 1 SENSITIVE Sensitive     TRIMETH/SULFA <=10 SENSITIVE Sensitive     CLINDAMYCIN <=0.25 SENSITIVE Sensitive     RIFAMPIN <=0.5 SENSITIVE Sensitive     Inducible Clindamycin NEGATIVE Sensitive     * 30,000 COLONIES/mL STAPHYLOCOCCUS AUREUS     Labs: BNP (last 3 results) No results for input(s): BNP in the last 8760 hours. Basic Metabolic Panel: Recent Labs  Lab  05/29/21 0617 05/29/21 1655 05/30/21 0543 05/31/21 0449 06/01/21 0316 06/02/21 0344 06/03/21 0239  NA 158*   < > 156* 152* 147* 144 140  K 3.6  --   --  4.4 4.4 4.1 4.0  CL 124*  --   --  112* 114* 110 108  CO2 27  --   --  32 GLUCOSE 250*  --   --  210* 213* 200* 200*  BUN 25*  --   --  20 24* 24* 19  CREATININE 0.98  --   --  1.09 0.96 0.98 0.83  CALCIUM 9.4  --   --  9.1 9.0 8.8* 8.6*   < > = values in this interval not displayed.   Liver Function Tests: No results for input(s): AST, ALT, ALKPHOS, BILITOT, PROT, ALBUMIN in the last 168 hours. No results for input(s): LIPASE, AMYLASE in the last 168 hours. No results for input(s): AMMONIA in the last 168 hours. CBC: Recent Labs  Lab 05/28/21 0450 05/29/21 0617 05/31/21 0449 06/02/21 0344 06/03/21 0239  WBC 17.7* 12.9* 12.8* 20.7* 13.8*  NEUTROABS  --   --  10.0* 18.0* 11.1*  HGB 15.6 13.5 12.9* 12.1* 12.0*  HCT 49.7 43.8 41.9 38.2* 36.5*  MCV 95.4 95.4 96.8 94.1 92.2  PLT 492* 434* 376 277 221   Cardiac Enzymes: No results for input(s): CKTOTAL, CKMB, CKMBINDEX, TROPONINI in  the last 168 hours. BNP: Invalid input(s): POCBNP CBG: Recent Labs  Lab 06/02/21 2346 06/02/21 2349 06/03/21 0420 06/03/21 0746 06/03/21 1124  GLUCAP 165* 178* 189* 163* 143*   D-Dimer No results for input(s): DDIMER in the last 72 hours. Hgb A1c No results for input(s): HGBA1C in the last 72 hours. Lipid Profile No results for input(s): CHOL, HDL, LDLCALC, TRIG, CHOLHDL, LDLDIRECT in the last 72 hours. Thyroid function studies No results for input(s): TSH, T4TOTAL, T3FREE, THYROIDAB in the last 72 hours.  Invalid input(s): FREET3 Anemia work up No results for input(s): VITAMINB12, FOLATE, FERRITIN, TIBC, IRON, RETICCTPCT in the last 72 hours. Urinalysis    Component Value Date/Time   COLORURINE YELLOW 05/24/2021 2224   APPEARANCEUR CLOUDY (A) 05/24/2021 2224   APPEARANCEUR Clear 09/11/2020 1449   LABSPEC 1.023  05/24/2021 2224   PHURINE 6.0 05/24/2021 2224   GLUCOSEU 50 (A) 05/24/2021 2224   HGBUR MODERATE (A) 05/24/2021 2224   BILIRUBINUR NEGATIVE 05/24/2021 2224   BILIRUBINUR Negative 09/11/2020 1449   KETONESUR NEGATIVE 05/24/2021 2224   PROTEINUR 30 (A) 05/24/2021 2224   UROBILINOGEN 0.2 04/15/2016 1108   NITRITE POSITIVE (A) 05/24/2021 2224   LEUKOCYTESUR NEGATIVE 05/24/2021 2224   Sepsis Labs Invalid input(s): PROCALCITONIN,  WBC,  LACTICIDVEN Microbiology Recent Results (from the past 240 hour(s))  Culture, Urine     Status: Abnormal   Collection Time: 05/26/21  8:58 AM   Specimen: Urine, Catheterized  Result Value Ref Range Status   Specimen Description URINE, CATHETERIZED  Final   Special Requests   Final    Normal Performed at [facility], [address] [place], [place] [address]    Culture 30,000 COLONIES/mL STAPHYLOCOCCUS AUREUS (A)  Final   Report Status 05/28/2021 FINAL  Final   Organism ID, Bacteria STAPHYLOCOCCUS AUREUS (A)  Final      Susceptibility   Staphylococcus aureus - MIC*    CIPROFLOXACIN <=0.5 SENSITIVE Sensitive     GENTAMICIN <=0.5 SENSITIVE Sensitive     NITROFURANTOIN 32 SENSITIVE Sensitive     OXACILLIN <=0.25 SENSITIVE Sensitive     TETRACYCLINE <=1 SENSITIVE Sensitive     VANCOMYCIN 1 SENSITIVE Sensitive     TRIMETH/SULFA <=10 SENSITIVE Sensitive     CLINDAMYCIN <=0.25 SENSITIVE Sensitive     RIFAMPIN <=0.5 SENSITIVE Sensitive     Inducible Clindamycin NEGATIVE Sensitive     * 30,000 COLONIES/mL STAPHYLOCOCCUS AUREUS     Time coordinating discharge: 45 minutes  SIGNED:   Coralie Keens, MD  [facility] 06/03/2021, 1:20 PM

## 2021-06-03 NOTE — Progress Notes (Signed)
Physical Therapy Treatment Patient Details Name: Donald [name] MRN: [id number] DOB: 01-09-1956 Today's Date: 06/03/2021    History of Present Illness The pt is a 65 yo male re-admitted from CIR on 6/12 due to sudden onset of worsening mental status, seizures, and facial droop. Repeat CT revealed enlargement of original R frontal ICH with worsening midline shift. Significant PMH includes: memory deficits, OSA.Marland Kitchen    PT Comments    Patient progressing towards physical therapy goals. Patient continues to require totalA+2 for bed mobility due to no initiation. Patient was able to perform sit to stand x 3 with STEDY and maxA+2. Manual facilitation for upright posture. Performed PROM cervical extension and L rotation. Continue to recommend comprehensive inpatient rehab (CIR) for post-acute therapy needs.    Follow Up Recommendations  CIR     Equipment Recommendations  3in1 (PT);Wheelchair (measurements PT);Wheelchair cushion (measurements PT);Hospital bed    Recommendations for Other Services       Precautions / Restrictions Precautions Precautions: Fall Precaution Comments: L hemiparesis, L inattention, pusher to the L, cognitive deficits, cortrak Restrictions Weight Bearing Restrictions: No    Mobility  Bed Mobility Overal bed mobility: Needs Assistance Bed Mobility: Supine to Sit;Sit to Supine Rolling: Max assist   Supine to sit: Total assist;+2 for safety/equipment;+2 for physical assistance Sit to supine: Total assist;+2 for physical assistance;+2 for safety/equipment   General bed mobility comments: totalA for all aspects with no initiation of movement    Transfers Overall transfer level: Needs assistance Equipment used: Ambulation equipment used Transfers: Sit to/from Stand Sit to Stand: +2 physical assistance;+2 safety/equipment;Max assist         General transfer comment: maxA+2 to rise and steady. Sit to stand x 3  Ambulation/Gait                  Stairs             Wheelchair Mobility    Modified Rankin (Stroke Patients Only) Modified Rankin (Stroke Patients Only) Pre-Morbid Rankin Score: No symptoms Modified Rankin: Severe disability     Balance Overall balance assessment: Needs assistance Sitting-balance support: Single extremity supported;Feet supported Sitting balance-Leahy Scale: Zero Sitting balance - Comments: requires maxA to maintain static sitting. Less pushing noted this session Postural control: Posterior lean;Left lateral lean Standing balance support: Bilateral upper extremity supported Standing balance-Leahy Scale: Zero                              Cognition Arousal/Alertness: Lethargic Behavior During Therapy: Flat affect Overall Cognitive Status: Impaired/Different from baseline Area of Impairment: Following commands;Safety/judgement;Awareness;Problem solving                       Following Commands: Follows one step commands inconsistently;Follows one step commands with increased time Safety/Judgement: Decreased awareness of safety;Decreased awareness of deficits Awareness: Intellectual Problem Solving: Requires verbal cues;Requires tactile cues;Slow processing;Decreased initiation;Difficulty sequencing        Exercises      General Comments        Pertinent Vitals/Pain Pain Assessment: Faces Faces Pain Scale: Hurts little more Pain Location: Neck and L shoulder Pain Descriptors / Indicators: Grimacing Pain Intervention(s): Monitored during session    Home Living                      Prior Function            PT Goals (current goals can now  be found in the care plan section) Acute Rehab PT Goals Patient Stated Goal: spouse wants pt to return to CIR PT Goal Formulation: With patient/family Time For Goal Achievement: 06/12/21 Potential to Achieve Goals: Fair Progress towards PT goals: Progressing toward goals    Frequency    Min  4X/week      PT Plan Current plan remains appropriate    Co-evaluation PT/OT/SLP Co-Evaluation/Treatment: Yes Reason for Co-Treatment: For patient/therapist safety;To address functional/ADL transfers PT goals addressed during session: Mobility/safety with mobility;Balance        AM-PAC PT "6 Clicks" Mobility   Outcome Measure  Help needed turning from your back to your side while in a flat bed without using bedrails?: Total Help needed moving from lying on your back to sitting on the side of a flat bed without using bedrails?: Total Help needed moving to and from a bed to a chair (including a wheelchair)?: Total Help needed standing up from a chair using your arms (e.g., wheelchair or bedside chair)?: Total Help needed to walk in hospital room?: Total Help needed climbing 3-5 steps with a railing? : Total 6 Click Score: 6    End of Session Equipment Utilized During Treatment: Gait belt Activity Tolerance: Patient tolerated treatment well Patient left: in bed;with call bell/phone within reach;with bed alarm set;with family/visitor present Nurse Communication: Mobility status PT Visit Diagnosis: Other abnormalities of gait and mobility (R26.89);Muscle weakness (generalized) (M62.81);Hemiplegia and hemiparesis Hemiplegia - Right/Left: Left Hemiplegia - dominant/non-dominant: Non-dominant Hemiplegia - caused by: Nontraumatic intracerebral hemorrhage     Time: 6144-3154 PT Time Calculation (min) (ACUTE ONLY): 32 min  Charges:  $Therapeutic Activity: 8-22 mins                     Jordanny Waddington A. Dan Humphreys PT, DPT Acute Rehabilitation Services Pager [phone] Office [phone]    Viviann Spare 06/03/2021, 5:46 PM

## 2021-06-03 NOTE — Progress Notes (Signed)
Occupational Therapy Treatment Patient Details Name: Donald [name] MRN: [id number] DOB: 1956/10/25 Today's Date: 06/03/2021    History of present illness The pt is a 65 yo male re-admitted from CIR on 6/12 due to sudden onset of worsening mental status, seizures, and facial droop. Repeat CT revealed enlargement of original R frontal ICH with worsening midline shift. Significant PMH includes: memory deficits, OSA..   OT comments  Patient with incremental gains to patient focused OT goals.  Patient needing up to +2 for bed mobility, edge of bed sitting, and attempted standing in the STEDY.  Time spent trying to correct posture with verbal and tactile input, and bringing his head up to track persons, and attend to his left visual field.  Barriers are listed below.  CIR has been recommended, but OT will follow in the acute setting to maximize functional status.    Follow Up Recommendations  CIR    Equipment Recommendations  Wheelchair (measurements OT);Wheelchair cushion (measurements OT);Hospital bed    Recommendations for Other Services      Precautions / Restrictions Precautions Precautions: Fall Precaution Comments: L hemiparesis, L inattention, pusher to the L, cognitive deficits, cortrak Restrictions Weight Bearing Restrictions: No       Mobility Bed Mobility Overal bed mobility: Needs Assistance Bed Mobility: Supine to Sit;Sit to Supine Rolling: Max assist   Supine to sit: Total assist;+2 for safety/equipment;+2 for physical assistance Sit to supine: Total assist;+2 for physical assistance;+2 for safety/equipment     Patient Response: Flat affect  Transfers Overall transfer level: Needs assistance   Transfers: Sit to/from Stand Sit to Stand: +2 physical assistance;+2 safety/equipment;Max assist              Balance Overall balance assessment: Needs assistance Sitting-balance support: Single extremity supported;Feet supported Sitting balance-Leahy Scale:  Zero   Postural control: Posterior lean;Left lateral lean Standing balance support: Bilateral upper extremity supported Standing balance-Leahy Scale: Zero                             A   Vision   Eye Alignment: Impaired (comment) Alignment/Gaze Preference: Head turned;Head tilt;Gaze right Tracking/Visual Pursuits: Requires cues, head turns, or add eye shifts to track;Decreased smoothness of vertical tracking;Decreased smoothness of eye movement to LEFT superior field;Decreased smoothness of eye movement to LEFT inferior field Visual Fields: Left visual field deficit;Left homonymous hemianopsia                         Pertinent Vitals/ Pain       Pain Assessment: Faces Faces Pain Scale: Hurts little more Pain Location: Neck and L shoulder Pain Descriptors / Indicators: Grimacing Pain Intervention(s): Premedicated before session                                                          Frequency  Min 2X/week        Progress Toward Goals  OT Goals(current goals can now be found in the care plan section)     Acute Rehab OT Goals Patient Stated Goal: spouse wants pt to return to CIR OT Goal Formulation: With family Time For Goal Achievement: 06/12/21 Potential to Achieve Goals: Good  Plan Discharge plan remains appropriate    Co-evaluation  PT/OT/SLP Co-Evaluation/Treatment: Yes Reason for Co-Treatment: Complexity of the patient's impairments (multi-system involvement);For patient/therapist safety          AM-PAC OT "6 Clicks" Daily Activity     Outcome Measure   Help from another person eating meals?: Total Help from another person taking care of personal grooming?: Total Help from another person toileting, which includes using toliet, bedpan, or urinal?: Total Help from another person bathing (including washing, rinsing, drying)?: Total Help from another person to put on and taking off regular upper body  clothing?: Total Help from another person to put on and taking off regular lower body clothing?: Total 6 Click Score: 6    End of Session Equipment Utilized During Treatment: Gait belt;Other (comment) (STEDY)  OT Visit Diagnosis: Unsteadiness on feet (R26.81);Muscle weakness (generalized) (M62.81) Symptoms and signs involving cognitive functions: Nontraumatic SAH Hemiplegia - Right/Left: Left Hemiplegia - dominant/non-dominant: Non-Dominant Hemiplegia - caused by: Nontraumatic SAH Pain - Right/Left: Right Pain - part of body: Shoulder   Activity Tolerance Patient tolerated treatment well   Patient Left in bed;with call bell/phone within reach;with family/visitor present   Nurse Communication          Time: 9211-9417 OT Time Calculation (min): 26 min  Charges: OT General Charges $OT Visit: 1 Visit OT Treatments $Therapeutic Activity: 8-22 mins  06/03/2021  Rich, OTR/L  Acute Rehabilitation Services  Office:  [phone]    Donald [name] 06/03/2021, 4:22 PM

## 2021-06-03 NOTE — Evaluation (Signed)
Speech Language Pathology Evaluation Patient Details Name: Donald [name] MRN: [id number] DOB: 04-14-1956 Today's Date: 06/03/2021 Time: 4401-0272 SLP Time Calculation (min) (ACUTE ONLY): 14 min  Problem List:  Patient Active Problem List   Diagnosis Date Noted   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HCC) 05/30/2021   Right lower lobe pneumonia 05/30/2021   Prediabetes 05/30/2021   Hyperglycemia 05/30/2021   Intracerebral hemorrhage 05/24/2021   Seizure (HCC) 05/23/2021   Malnutrition of moderate degree 05/22/2021   Memory deficits    OSA (obstructive sleep apnea)    Generalized OA    Hypokalemia    Leukocytosis    ICH (intracerebral hemorrhage) (HCC) 05/11/2021   Annual physical exam 04/03/2017   Past Medical History:  Past Medical History:  Diagnosis Date   Arthritis    Past Surgical History:  Past Surgical History:  Procedure Laterality Date   APPENDECTOMY     IR ANGIO INTRA EXTRACRAN SEL COM CAROTID INNOMINATE BILAT MOD SED  05/28/2021   IR ANGIO VERTEBRAL SEL VERTEBRAL BILAT MOD SED  05/28/2021   IR IVC FILTER PLMT / S&I /IMG GUID/MOD SED  05/27/2021   IR US GUIDE VASC ACCESS RIGHT  05/27/2021   HPI:  Donald [name] is a 65 y.o. male with a medical history significant for dementia with behavioral disturbance, OSA, arthritis  admitted 5/31 with acute parenchymal hemorrhage in the posterior right frontal lobe with small amount of adjacent subarachnoid hemorrhage. Seen by ST and progressed from puree/thin to Dys 2. Transferred to CIR and experienced increased stroke symptoms 6/12 morning. CT revealed progressed right frontal hematoma and vasogenic edema with midline shift now measuring 13 mm and transferred to acute. Per chart and wife history he had some short term memory deficits and evaluated and diagnosed with mild cognitive deficits at [facility].   Assessment / Plan / Recommendation Clinical Impression  Therapist noted pt did not have orders for speech-language-cognitive assessment and  was being treated for this prior to rehab admission. He has significantly declined since initial evaluation in regards to level of alertness, cognition and speech intelligibility. Severe neglect of left side of his environment with head in a mostly fixed position to the right. He was awake and drowsy able to follow simple commands with tactile/verbal cues to initiate during functional motor tasks. Responded verbally to therapists questions on 2 occasions in phrase that was 100% unintelligible. ST will continue treatment with focus on attention, intelligibility and communication from cognitive aspect.    SLP Assessment  SLP Recommendation/Assessment: Patient needs continued Speech Lanaguage Pathology Services SLP Visit Diagnosis: Cognitive communication deficit (R41.841)    Follow Up Recommendations  Skilled Nursing facility    Frequency and Duration min 2x/week  2 weeks      SLP Evaluation Cognition  Overall Cognitive Status: Impaired/Different from baseline Arousal/Alertness:  (drowsy, awake) Orientation Level: Oriented to person;Disoriented to situation Attention: Sustained Sustained Attention: Impaired Sustained Attention Impairment: Verbal basic;Functional basic Memory: Impaired Awareness: Impaired Awareness Impairment: Intellectual impairment;Emergent impairment;Anticipatory impairment Problem Solving: Impaired Problem Solving Impairment: Functional basic Executive Function: Initiating;Reasoning;Sequencing;Organizing;Self Correcting;Self Monitoring Safety/Judgment: Impaired       Comprehension  Auditory Comprehension Overall Auditory Comprehension: Other (comment) (affected by reduced attention/cognition not language) Commands:  (followed simple one step, difficulty initiating and ceasing movements) Interfering Components: Attention;Processing speed;Working memory;Visual impairments EffectiveTechniques: Barrister's clerk:  Not tested Reading Comprehension Reading Status: Not tested    Expression Expression Primary Mode of Expression: Verbal Verbal Expression Overall Verbal Expression:  (dysarthric, language suspect intact) Pragmatics: Impairment  Impairments: Abnormal affect;Eye contact Interfering Components: Attention Written Expression Dominant Hand: Right Written Expression: Not tested   Oral / Motor  Oral Motor/Sensory Function Overall Oral Motor/Sensory Function: Moderate impairment Facial ROM: Reduced left;Suspected CN VII (facial) dysfunction Facial Symmetry: Abnormal symmetry left;Suspected CN VII (facial) dysfunction Facial Strength: Reduced left;Suspected CN VII (facial) dysfunction Lingual ROM: Reduced left;Suspected CN XII (hypoglossal) dysfunction Lingual Strength: Reduced;Suspected CN XII (hypoglossal) dysfunction Motor Speech Overall Motor Speech: Impaired Respiration: Impaired Level of Impairment: Word Phonation: Wet;Low vocal intensity;Hoarse Resonance: Within functional limits Articulation: Impaired Level of Impairment: Word Intelligibility: Intelligibility reduced Word: 25-49% accurate Phrase: 0-24% accurate Motor Planning: Witnin functional limits   GO                    [name] 06/03/2021, 10:28 AM  [name] [name].Ed Nurse, children's [phone] Office [phone]

## 2021-06-03 NOTE — Progress Notes (Signed)
STROKE TEAM PROGRESS NOTE   SUBJECTIVE (INTERVAL HISTORY) His wife is at the bedside. Pt neuro improving with more alertness and interactivity.  , no acute event overnight.    He has been started on antibiotics.  He is afebrile.  Vital signs are stable.  Neurological exam is unchanged.  Blood pressure adequately controlled.   Continues to have dysphagia and has tube feeding.  But speech therapy plan to do swallow eval later today.    Serum sodium is down to 140.  Patient has been started on amantadine to promote wakefulness OBJECTIVE Temp:  [98 F (36.7 C)-99.3 F (37.4 C)] 98.2 F (36.8 C) (06/23 1128) Pulse Rate:  [91-98] 91 (06/23 1128) Cardiac Rhythm: Normal sinus rhythm (06/23 0805) Resp:  [16-19] 18 (06/23 1128) BP: (118-131)/(70-85) 121/75 (06/23 1128) SpO2:  [94 %-100 %] 100 % (06/23 1128)  Recent Labs  Lab 06/02/21 2346 06/02/21 2349 06/03/21 0420 06/03/21 0746 06/03/21 1124  GLUCAP 165* 178* 189* 163* 143*   Recent Labs  Lab 05/29/21 0617 05/29/21 1655 05/30/21 0543 05/31/21 0449 06/01/21 0316 06/02/21 0344 06/03/21 0239  NA 158*   < > 156* 152* 147* 144 140  K 3.6  --   --  4.4 4.4 4.1 4.0  CL 124*  --   --  112* 114* 110 108  CO2 27  --   --  32 GLUCOSE 250*  --   --  210* 213* 200* 200*  BUN 25*  --   --  20 24* 24* 19  CREATININE 0.98  --   --  1.09 0.96 0.98 0.83  CALCIUM 9.4  --   --  9.1 9.0 8.8* 8.6*   < > = values in this interval not displayed.   No results for input(s): AST, ALT, ALKPHOS, BILITOT, PROT, ALBUMIN in the last 168 hours.  Recent Labs  Lab 05/28/21 0450 05/29/21 0617 05/31/21 0449 06/02/21 0344 06/03/21 0239  WBC 17.7* 12.9* 12.8* 20.7* 13.8*  NEUTROABS  --   --  10.0* 18.0* 11.1*  HGB 15.6 13.5 12.9* 12.1* 12.0*  HCT 49.7 43.8 41.9 38.2* 36.5*  MCV 95.4 95.4 96.8 94.1 92.2  PLT 492* 434* 376 277 221   No results for input(s): CKTOTAL, CKMB, CKMBINDEX, TROPONINI in the last 168 hours. No results for input(s):  LABPROT, INR in the last 72 hours. No results for input(s): COLORURINE, LABSPEC, PHURINE, GLUCOSEU, HGBUR, BILIRUBINUR, KETONESUR, PROTEINUR, UROBILINOGEN, NITRITE, LEUKOCYTESUR in the last 72 hours.  Invalid input(s): APPERANCEUR      Component Value Date/Time   CHOL 232 (H) 05/11/2021 1118   CHOL 204 (H) 09/11/2020 1449   TRIG 73 05/11/2021 1118   HDL 77 05/11/2021 1118   HDL 63 09/11/2020 1449   CHOLHDL 3.0 05/11/2021 1118   VLDL 15 05/11/2021 1118   LDLCALC 140 (H) 05/11/2021 1118   LDLCALC 132 (H) 09/11/2020 1449   LDLCALC 107 (H) 04/29/2020 0826   Lab Results  Component Value Date   HGBA1C 5.8 (H) 05/11/2021      Component Value Date/Time   LABOPIA NONE DETECTED 05/11/2021 1109   COCAINSCRNUR NONE DETECTED 05/11/2021 1109   LABBENZ NONE DETECTED 05/11/2021 1109   AMPHETMU NONE DETECTED 05/11/2021 1109   THCU NONE DETECTED 05/11/2021 1109   LABBARB NONE DETECTED 05/11/2021 1109    No results for input(s): ETH in the last 168 hours.   I have personally reviewed the radiological images below and agree with the radiology interpretations.  DG  Abd 1 View  Result Date: 05/20/2021 CLINICAL DATA:  Abdominal pain. EXAM: ABDOMEN - 1 VIEW COMPARISON:  None. FINDINGS: Large amount of intestinal gas throughout small and large bowel suggesting ileus. There does not appear to be a large amount of fecal matter. No focal obstruction is evident. No free air. No abnormal calcifications or significant bone findings. IMPRESSION: Large amount of intestinal gas suggesting ileus. Electronically Signed   By: Paulina Fusi M.D.   On: 05/20/2021 19:25   CT HEAD WO CONTRAST  Result Date: 05/27/2021 CLINICAL DATA:  65 year old male with right hemisphere hemorrhage at code stroke presentation 05/11/2021. Subsequent encounter. EXAM: CT HEAD WITHOUT CONTRAST TECHNIQUE: Contiguous axial images were obtained from the base of the skull through the vertex without intravenous contrast. COMPARISON:  Head CT  05/23/2021 and earlier. FINDINGS: Brain: Evolving right superior frontal lobe hemorrhage. Slowly fading hyperdense and mixed density blood products involving the superior and middle frontal gyri in an area of up to 48 x 35 x 50 mm (AP by transverse by CC). Continued regional edema and mass effect including effaced right lateral ventricle and leftward midline shift up to 7 mm at the midportion of the septum pellucidum. No ventriculomegaly. No intraventricular or significant extra-axial hemorrhage. Basilar cisterns remain patent. Stable gray-white matter differentiation elsewhere. No acute cortically based infarct. Vascular: Mild Calcified atherosclerosis at the skull base. Skull: Stable, negative. Sinuses/Orbits: Mild to moderate maxillary alveolar recess mucosal thickening. Otherwise paranasal sinuses and mastoids are stable and well aerated. Other: Right nasoenteric tube in place. Visualized orbits and scalp soft tissues are within normal limits. IMPRESSION: 1. Slowly evolving right superior frontal lobe hemorrhage, not significantly changed in size or configuration since 05/23/2021, with stable regional edema and mass effect. 2. No new intracranial abnormality. Electronically Signed   By: Odessa Fleming M.D.   On: 05/27/2021 06:24   CT HEAD WO CONTRAST  Result Date: 05/23/2021 CLINICAL DATA:  Follow-up intracerebral hemorrhage EXAM: CT HEAD WITHOUT CONTRAST TECHNIQUE: Contiguous axial images were obtained from the base of the skull through the vertex without intravenous contrast. COMPARISON:  CT 05/23/2021 FINDINGS: Brain: No significant interval change in the overall size of the intraparenchymal hemorrhagic component seen in the right frontal with some extensive surrounding hypo attenuating edematous changes. Locoregional mass effect with sulcal effacement and effacement of the right lateral ventricle as well as stable subfalcine herniation with a right to left midline shift of approximately 14 mm. Slight  medialization of the right uncus without clear transtentorial herniation is also unchanged from prior. Basal cisterns remain patent. No new sites of hemorrhage. No new loss of gray-white differentiation. Vascular: No hyperdense vessel or unexpected calcification. Skull: Normal. Negative for fracture or focal lesion. EEG leads in place. Sinuses/Orbits: Mild mural thickening in the maxillary sinuses. Paranasal sinuses and mastoid air cells are otherwise predominantly clear. Included orbital structures are unremarkable. Other: None. IMPRESSION: 1. Stable appearance of the right frontal lobe intraparenchymal hemorrhage with surrounding cerebral edema, local mass effect as well as subfalcine midline shift of approximately 14 mm in slight medial ization of the right uncus, not significantly changed from comparison imaging. 2. No new sites of hemorrhage or new gray-white differentiation loss. Electronically Signed   By: Kreg Shropshire M.D.   On: 05/23/2021 23:58   CT HEAD WO CONTRAST  Result Date: 05/23/2021 CLINICAL DATA:  Follow-up right frontal parenchymal hemorrhage seen earlier today. EXAM: CT HEAD WITHOUT CONTRAST TECHNIQUE: Contiguous axial images were obtained from the base of the skull through the  vertex without intravenous contrast. COMPARISON:  Earlier today. FINDINGS: Brain: No significant change in the previously described high right frontal lobe parenchymal hematoma with extensive surrounding edema and midline shift to the left. The midline shift currently measures 10 mm on image number 16/3. No interval ventricular enlargement. No new hemorrhage. Vascular: No hyperdense vessel or unexpected calcification. Skull: Normal. Negative for fracture or focal lesion. Sinuses/Orbits: Stable small left frontal sinus osteoma. Mild moderate left maxillary sinus mucosal thickening without significant change. Unremarkable orbits. Other: Bilateral concha bullosa. IMPRESSION: 1. Stable large high right frontal parenchymal  hematoma with extensive associated edema and midline shift to the left. 2. Stable left maxillary chronic sinusitis. Electronically Signed   By: Beckie SaltsSteven  Reid M.D.   On: 05/23/2021 18:00   CT HEAD WO CONTRAST  Result Date: 05/23/2021 CLINICAL DATA:  Cerebral hemorrhage, decreased responsiveness EXAM: CT HEAD WITHOUT CONTRAST TECHNIQUE: Contiguous axial images were obtained from the base of the skull through the vertex without intravenous contrast. COMPARISON:  Earlier same day FINDINGS: Brain: Parenchymal hematoma centered within the high right frontal lobe does not measure substantially changed from the prior study. Extensive surrounding edema is again noted with regional mass effect. Leftward midline shift measures similar at 14 mm at the level of the septum pellucidum. Ventricle caliber is unchanged with no new trapping. No significant central herniation. There is no new loss of gray-white differentiation. Intraventricular hemorrhage is identified. Vascular: No new findings. Skull: Unremarkable. Sinuses/Orbits: No acute finding. Other: None. IMPRESSION: No substantial change in right frontal parenchymal hemorrhage, edema, and mass effect including midline shift. No new ventricle trapping or acute infarction. Electronically Signed   By: Guadlupe SpanishPraneil  Patel M.D.   On: 05/23/2021 12:49   CT HEAD WO CONTRAST  Result Date: 05/11/2021 CLINICAL DATA:  Stroke.  Intracranial hemorrhage follow-up. EXAM: CT HEAD WITHOUT CONTRAST TECHNIQUE: Contiguous axial images were obtained from the base of the skull through the vertex without intravenous contrast. COMPARISON:  Same day head CT. FINDINGS: Brain: Mild interval increase in the size of the right frontal intraparenchymal hemorrhage, measuring 4.5 x 2.6 x 3.5 cm (previously 4.2 x 2.3 x 3.1 cm). Similar adjacent small volume subarachnoid hemorrhage. A small amount of subdural hemorrhage in this region is also not excluded. Surrounding edema is similar. Similar local mass  effect without midline shift. Basal cisterns are patent. No evidence of acute large vascular territory infarct elsewhere. Similar mild white matter hypodensities, likely chronic microvascular ischemic disease. No hydrocephalus. Vascular: No hyperdense vessel.  Calcific atherosclerosis. Skull: No acute fracture. Sinuses/Orbits: Small left frontal sinus osteoma. Mild right and moderate left maxillary sinus mucosal thickening. Unremarkable orbits. Other: No mastoid effusions. IMPRESSION: Mild interval increase in the size of the right frontal intraparenchymal hemorrhage, measuring 4.5 x 2.6 x 3.5 cm (previously 4.2 x 2.3 x 3.1 cm). Similar adjacent small volume hemorrhage. Electronically Signed   By: Feliberto HartsFrederick S Jones MD   On: 05/11/2021 17:05   MR BRAIN WO CONTRAST  Result Date: 05/12/2021 CLINICAL DATA:  [age] year old male code stroke presentation with intra-axial right hemisphere hemorrhage. Hypertensive (194/104) on presentation. EXAM: MRI HEAD WITHOUT CONTRAST TECHNIQUE: Multiplanar, multiecho pulse sequences of the brain and surrounding structures were obtained without intravenous contrast. COMPARISON:  CT head CTA head and neck 05/11/2021 FINDINGS: Brain: Coronal T2 weighted imaging could not be obtained. And some of the exam is intermittently degraded by motion artifact despite repeated imaging attempts. T1 isointense intra-axial hemorrhage in the posterior right frontal lobe demonstrates a layering hematocrit level (series 13,  image 19) and blood encompasses 43 by 38 x 33 mm (AP by transverse by CC) for an estimated volume of 26 mL. Regional edema. Mild regional mass effect. Trace subarachnoid hemorrhage also suspected on axial FLAIR imaging. Additionally, on SWI there is also chronic hemosiderin in the right parietal lobe on series 19 image 34. But no other definite chronic blood products. Susceptibility related abnormal diffusion in the posterior right frontal lobe with no convincing larger area of  restricted diffusion. No restricted diffusion elsewhere. No IVH or ventriculomegaly. Normal basilar cisterns. Negative pituitary and cervicomedullary junction. Wallace Cullens and white matter signal outside of the affected right hemisphere is largely normal for age with mild nonspecific white matter changes. Vascular: Major intracranial vascular flow voids are preserved. Skull and upper cervical spine: Visualized bone marrow signal is within normal limits. Grossly negative cervical spine. Sinuses/Orbits: Negative orbits. Mild to moderate maxillary sinus mucosal thickening. Other: Mastoids are clear. Grossly normal visible internal auditory structures. IMPRESSION: 1. Posterior right frontal lobe intra-axial hemorrhage with layering hematocrit level has not significantly changed (estimated blood volume of 26 mL). Surrounding edema and mild regional mass effect. Trace subarachnoid hemorrhage. 2. No larger underlying infarct is evident. But chronic hemosiderin in the right parietal lobe indicates a previous intra-axial hemorrhage in the region. 3. No other acute intracranial abnormality. Electronically Signed   By: Odessa Fleming M.D.   On: 05/12/2021 07:26   IR IVC FILTER PLMT / S&I Lenise Arena GUID/MOD SED  Result Date: 05/27/2021 INDICATION: 65 year old gentleman with lower extremity DVT and acute intracranial hemorrhage presents to IR for IVC filter placement EXAM: Retrievable IVC filter placement utilizing fluoroscopic and ultrasound guidance MEDICATIONS: None. ANESTHESIA/SEDATION: One mg IV Versed; 50 mcg IV Fentanyl Moderate Sedation Time:  12 minutes The patient was continuously monitored during the procedure by the interventional radiology nurse under my direct supervision. FLUOROSCOPY TIME:  Fluoroscopy Time: 1 minutes 6 seconds (9 mGy). COMPLICATIONS: None immediate. PROCEDURE: Informed written consent was obtained from the patient after a thorough discussion of the procedural risks, benefits and alternatives. All questions were  addressed. Maximal Sterile Barrier Technique was utilized including caps, mask, sterile gowns, sterile gloves, sterile drape, hand hygiene and skin antiseptic. A timeout was performed prior to the initiation of the procedure. Patient positioned supine on the angiography table. Right neck prepped and draped in usual sterile fashion. All elements of maximal sterile barrier were utilized including, cap, mask, sterile gown, sterile gloves, large sterile drape, hand scrubbing and 2% Chlorhexidine for skin cleaning. The right internal jugular vein was evaluated with ultrasound and shown to be patent. A permanent ultrasound image was obtained and placed in the patient's medical record. Using sterile gel and a sterile probe cover, the right internal jugular vein was entered with a 21 ga needle during real time ultrasound guidance. 21 gauge needle exchanged for a transitional dilator set over 0.018 inch guidewire. Transitional dilator set exchanged for 10 fr sheath over 0.035 inch guidewire. Sheath placed to the infrarenal IVC and CO2 venogram performed to delineate the position of the renal veins. Retrievable Denali IVC filter deployed in the infrarenal location. Sheath removed and hemostasis achieved with manual compression. IMPRESSION: Retrievable Denali IVC filter placed. PLAN: This IVC filter is potentially retrievable. The patient will be assessed for filter retrieval by Interventional Radiology in approximately 8-12 weeks. Further recommendations regarding filter retrieval, continued surveillance or declaration of device permanence, will be made at that time. Electronically Signed   By: Acquanetta Belling M.D.   On: 05/27/2021  16:38   IR US Guide Vasc Access Right  Result Date: 05/27/2021 INDICATION: 65 year old gentleman with lower extremity DVT and acute intracranial hemorrhage presents to IR for IVC filter placement EXAM: Retrievable IVC filter placement utilizing fluoroscopic and ultrasound guidance MEDICATIONS:  None. ANESTHESIA/SEDATION: One mg IV Versed; 50 mcg IV Fentanyl Moderate Sedation Time:  12 minutes The patient was continuously monitored during the procedure by the interventional radiology nurse under my direct supervision. FLUOROSCOPY TIME:  Fluoroscopy Time: 1 minutes 6 seconds (9 mGy). COMPLICATIONS: None immediate. PROCEDURE: Informed written consent was obtained from the patient after a thorough discussion of the procedural risks, benefits and alternatives. All questions were addressed. Maximal Sterile Barrier Technique was utilized including caps, mask, sterile gowns, sterile gloves, sterile drape, hand hygiene and skin antiseptic. A timeout was performed prior to the initiation of the procedure. Patient positioned supine on the angiography table. Right neck prepped and draped in usual sterile fashion. All elements of maximal sterile barrier were utilized including, cap, mask, sterile gown, sterile gloves, large sterile drape, hand scrubbing and 2% Chlorhexidine for skin cleaning. The right internal jugular vein was evaluated with ultrasound and shown to be patent. A permanent ultrasound image was obtained and placed in the patient's medical record. Using sterile gel and a sterile probe cover, the right internal jugular vein was entered with a 21 ga needle during real time ultrasound guidance. 21 gauge needle exchanged for a transitional dilator set over 0.018 inch guidewire. Transitional dilator set exchanged for 10 fr sheath over 0.035 inch guidewire. Sheath placed to the infrarenal IVC and CO2 venogram performed to delineate the position of the renal veins. Retrievable Denali IVC filter deployed in the infrarenal location. Sheath removed and hemostasis achieved with manual compression. IMPRESSION: Retrievable Denali IVC filter placed. PLAN: This IVC filter is potentially retrievable. The patient will be assessed for filter retrieval by Interventional Radiology in approximately 8-12 weeks. Further  recommendations regarding filter retrieval, continued surveillance or declaration of device permanence, will be made at that time. Electronically Signed   By: Acquanetta Belling M.D.   On: 05/27/2021 16:38   DG Chest Port 1 View  Result Date: 06/02/2021 CLINICAL DATA:  Increased white blood cell count.  Fever. EXAM: PORTABLE CHEST 1 VIEW COMPARISON:  May 31, 2021. FINDINGS: New medial left basilar/retrocardiac opacity. No visible pneumothorax or pleural effusions on this single semi erect radiograph. Cardiomediastinal silhouette is within normal limits and similar to prior. Enteric tube courses below the diaphragm with the tip outside the field of view. IMPRESSION: New medial left basilar/retrocardiac airspace opacity, which is suspicious for pneumonia given reported leukocytosis and fever. Electronically Signed   By: Feliberto Harts MD   On: 06/02/2021 13:57   DG Chest Port 1 View  Result Date: 05/31/2021 CLINICAL DATA:  Dyspnea EXAM: PORTABLE CHEST 1 VIEW COMPARISON:  Portable exam 0912 hours compared to 05/24/2021 FINDINGS: Feeding tube traverses esophagus and visualized stomach. Normal heart size, mediastinal contours, and pulmonary vascularity. Lungs clear. No pulmonary infiltrate, pleural effusion, or pneumothorax. Osseous structures unremarkable. IMPRESSION: No acute abnormalities. Electronically Signed   By: Ulyses Southward M.D.   On: 05/31/2021 10:54   DG CHEST PORT 1 VIEW  Result Date: 05/24/2021 CLINICAL DATA:  Leukocytosis EXAM: PORTABLE CHEST 1 VIEW COMPARISON:  04/28/15 FINDINGS: Cardiac shadow is within normal limits. Mild aortic calcifications are noted. The lungs are well aerated bilaterally with minimal left basilar atelectasis. No bony abnormality is seen. IMPRESSION: Minimal left basilar atelectasis. Electronically Signed   By:  Alcide Clever M.D.   On: 05/24/2021 10:52   DG Abd Portable 1V  Result Date: 05/24/2021 CLINICAL DATA:  Feeding tube placement EXAM: PORTABLE ABDOMEN - 1 VIEW  COMPARISON:  05/20/2021 FINDINGS: Limited radiograph of the lower chest and upper abdomen was obtained for the purposes of enteric tube localization. Enteric tube is seen coursing below the diaphragm with distal tip terminating within the expected location of the gastric body. Air-filled large and small bowel loops throughout the abdomen, similar to prior. IMPRESSION: Enteric tube tip terminates within the gastric body. Electronically Signed   By: Duanne Guess D.O.   On: 05/24/2021 13:50   EEG adult  Result Date: 05/23/2021 Charlsie Quest, MD     05/23/2021  4:11 PM Patient Name: Viraat Vanpatten MRN: [id number] Epilepsy Attending: Charlsie Quest Referring Physician/Provider: Dr Bing Neighbors Date: 05/23/2021 Duration: 25.36 mins Patient history:  65 y.o. male currently admitted for ICH on 05/11/21 developed somnolence and aphasia with R gaze deviation that resolved with ativan. EEG to evaluate for seizure Level of alertness: asleep AEDs during EEG study: LEV Technical aspects: This EEG study was done with scalp electrodes positioned according to the 10-20 International system of electrode placement. Electrical activity was acquired at a sampling rate of 500Hz  and reviewed with a high frequency filter of 70Hz  and a low frequency filter of 1Hz . EEG data were recorded continuously and digitally stored. Description: No posterior dominant rhythm was seen. Sleep was characterized by sleep spindles (12 to 14 Hz), maximal frontocentral region. EEG showed continuous generalized 3 to 6 Hz theta-delta slowing. There is an excessive amount of 15 to 18 Hz, 2-3 uV beta activity distributed symmetrically and diffusely.  Hyperventilation and photic stimulation were not performed.   ABNORMALITY - Continuous slow, generalized - Excessive beta, generalized IMPRESSION: This study is suggestive of moderate to severe diffuse encephalopathy, nonspecific etiology. The excessive beta activity seen in the background is most likely  due to the effect of benzodiazepine and is a benign EEG pattern. No seizures or epileptiform discharges were seen throughout the recording. Priyanka Annabelle Harman   Overnight EEG with video  Result Date: 05/24/2021 Charlsie Quest, MD     05/25/2021  9:42 AM Patient Name: Demetrious Rainford MRN: [id number] Epilepsy Attending: Charlsie Quest Referring Physician/Provider: Dr Bing Neighbors Duration: 05/23/2021 1655 to 05/24/2021 1655  Patient history:  65 y.o. male currently admitted for ICH on 05/11/21 developed somnolence and aphasia with R gaze deviation that resolved with ativan. EEG to evaluate for seizure  Level of alertness: awake, asleep  AEDs during EEG study: LEV  Technical aspects: This EEG study was done with scalp electrodes positioned according to the 10-20 International system of electrode placement. Electrical activity was acquired at a sampling rate of 500Hz  and reviewed with a high frequency filter of 70Hz  and a low frequency filter of 1Hz . EEG data were recorded continuously and digitally stored.  Description: No posterior dominant rhythm was seen. Sleep was characterized by sleep spindles (12 to 14 Hz), asymmetry ( R<L) maximal frontocentral region. EEG showed continuous generalized and lateralized right hemisphere 3 to 6 Hz theta-delta slowing. Hyperventilation and photic stimulation were not performed.    ABNORMALITY - Continuous slow, generalized and lateralized right hemisphere - Spindle asymmetry, right<left  IMPRESSION: This study is suggestive of cortical dysfunction in right hemisphere likely secondary to underlying structural abnormality.  Additionally there is moderate to severe diffuse encephalopathy, nonspecific etiology. No seizures or definite epileptiform discharges were seen throughout the  recording.   Charlsie Quest   MR MRV HEAD W WO CONTRAST  Result Date: 05/30/2021 CLINICAL DATA:  Right parenchymal hemorrhage.  Abnormal arteriogram. EXAM: MR VENOGRAM HEAD WITHOUT AND WITH  CONTRAST TECHNIQUE: Angiographic images of the intracranial venous structures were acquired using MRV technique without and with intravenous contrast. CONTRAST:  6.50mL GADAVIST GADOBUTROL 1 MMOL/ML IV SOLN COMPARISON:  Cerebral arteriogram 05/28/2021. MR venogram 05/12/2021. FINDINGS: Stable appearance of dominant left transverse sinus. Postcontrast images demonstrate no thrombus. Straight sinus and deep cerebral veins are intact. Cortical veins are unremarkable. Superior sagittal sinus normal. Sagittal sinus and intra on the jugular veins are normal bilaterally. IMPRESSION: Normal variant MR venogram without significant change. No sinus thrombosis. Electronically Signed   By: Marin Roberts M.D.   On: 05/30/2021 12:49   MR MRV HEAD W WO CONTRAST  Result Date: 05/12/2021 CLINICAL DATA:  Intracranial hemorrhage. EXAM: MR VENOGRAM HEAD WITHOUT AND WITH CONTRAST TECHNIQUE: Angiographic images of the intracranial venous structures were acquired using MRV technique without and with intravenous contrast. CONTRAST:  28mL GADAVIST GADOBUTROL 1 MMOL/ML IV SOLN COMPARISON:  Brain MRI performed earlier today 05/12/2021. Noncontrast head CT examinations 05/11/2021. CT angiogram head/neck 05/11/2021. FINDINGS: The superior sagittal sinus, internal cerebral veins, vein of Galen, straight sinus, transverse sinuses, sigmoid sinuses and visualized jugular veins are patent. No appreciable intracranial venous thrombosis. Hypoplastic right transverse and sigmoid dural venous sinuses. An acute parenchymal hemorrhage within the posterior right frontal lobe is grossly unchanged in size as compared to the brain MRI performed earlier today. There is mild curvilinear vascular enhancement overlying the site of hemorrhage, which may be reactive. Elsewhere, no abnormal intracranial enhancement is identified. IMPRESSION: No evidence of intracranial venous thrombosis. Non dominant right transverse and sigmoid dural venous sinuses.  Electronically Signed   By: Jackey Loge DO   On: 05/12/2021 11:27   ECHOCARDIOGRAM COMPLETE  Result Date: 05/11/2021    ECHOCARDIOGRAM REPORT   Patient Name:   [facility] Date of Exam: 05/11/2021 Medical Rec #:  [id number]        Height:       68.0 in Accession #:    [id number]       Weight:       155.2 lb Date of Birth:  03-13-1956         BSA:          1.835 m Patient Age:    65 years         BP:           115/77 mmHg Patient Gender: M                HR:           84 bpm. Exam Location:  Inpatient Procedure: 2D Echo, Cardiac Doppler and Color Doppler Indications:    Stroke I63.9  History:        Patient has no prior history of Echocardiogram examinations.  Sonographer:    Roosvelt Maser RDCS Referring Phys: [id number] Reyne Dumas [facility] IMPRESSIONS  1. Left ventricular ejection fraction, by estimation, is 65 to 70%. The left ventricle has normal function. The left ventricle has no regional wall motion abnormalities. Left ventricular diastolic parameters were normal.  2. Right ventricular systolic function is normal. The right ventricular size is normal. Tricuspid regurgitation signal is inadequate for assessing PA pressure.  3. The mitral valve is normal in structure. No evidence of mitral valve regurgitation.  4. The aortic valve was not well visualized. Aortic  valve regurgitation is not visualized. No aortic stenosis is present.  5. The inferior vena cava is normal in size with greater than 50% respiratory variability, suggesting right atrial pressure of 3 mmHg. FINDINGS  Left Ventricle: Left ventricular ejection fraction, by estimation, is 65 to 70%. The left ventricle has normal function. The left ventricle has no regional wall motion abnormalities. The left ventricular internal cavity size was normal in size. There is  no left ventricular hypertrophy. Left ventricular diastolic parameters were normal. Right Ventricle: The right ventricular size is normal. No increase in right ventricular wall thickness. Right  ventricular systolic function is normal. Tricuspid regurgitation signal is inadequate for assessing PA pressure. Left Atrium: Left atrial size was normal in size. Right Atrium: Right atrial size was normal in size. Pericardium: There is no evidence of pericardial effusion. Mitral Valve: The mitral valve is normal in structure. No evidence of mitral valve regurgitation. Tricuspid Valve: The tricuspid valve is normal in structure. Tricuspid valve regurgitation is not demonstrated. Aortic Valve: The aortic valve was not well visualized. Aortic valve regurgitation is not visualized. No aortic stenosis is present. Aortic valve mean gradient measures 3.0 mmHg. Aortic valve peak gradient measures 6.7 mmHg. Aortic valve area, by VTI measures 2.77 cm. Pulmonic Valve: The pulmonic valve was not well visualized. Pulmonic valve regurgitation is not visualized. Aorta: The aortic root and ascending aorta are structurally normal, with no evidence of dilitation. Venous: The inferior vena cava is normal in size with greater than 50% respiratory variability, suggesting right atrial pressure of 3 mmHg. IAS/Shunts: The interatrial septum was not well visualized.  LEFT VENTRICLE PLAX 2D LVIDd:         4.90 cm  Diastology LVIDs:         2.80 cm  LV e' medial:    8.38 cm/s LV PW:         0.80 cm  LV E/e' medial:  9.0 LV IVS:        0.90 cm  LV e' lateral:   9.14 cm/s LVOT diam:     1.90 cm  LV E/e' lateral: 8.2 LV SV:         69 LV SV Index:   37 LVOT Area:     2.84 cm  RIGHT VENTRICLE RV Basal diam:  2.90 cm LEFT ATRIUM           Index       RIGHT ATRIUM           Index LA diam:      3.10 cm 1.69 cm/m  RA Area:     11.20 cm LA Vol (A2C): 26.8 ml 14.61 ml/m RA Volume:   21.90 ml  11.94 ml/m LA Vol (A4C): 39.2 ml 21.36 ml/m  AORTIC VALVE AV Area (Vmax):    2.64 cm AV Area (Vmean):   2.59 cm AV Area (VTI):     2.77 cm AV Vmax:           129.00 cm/s AV Vmean:          86.700 cm/s AV VTI:            0.248 m AV Peak Grad:      6.7  mmHg AV Mean Grad:      3.0 mmHg LVOT Vmax:         120.00 cm/s LVOT Vmean:        79.200 cm/s LVOT VTI:          0.242 m LVOT/AV VTI ratio:  0.98  AORTA Ao Root diam: 2.50 cm Ao Asc diam:  3.30 cm MITRAL VALVE MV Area (PHT): 3.60 cm     SHUNTS MV Decel Time: 211 msec     Systemic VTI:  0.24 m MV E velocity: 75.30 cm/s   Systemic Diam: 1.90 cm MV A velocity: 107.00 cm/s MV E/A ratio:  0.70 Epifanio Lesches MD Electronically signed by Epifanio Lesches MD Signature Date/Time: 05/11/2021/5:28:15 PM    Final    CT HEAD CODE STROKE WO CONTRAST  Result Date: 05/23/2021 CLINICAL DATA:  Code stroke.  Acute stroke. EXAM: CT HEAD WITHOUT CONTRAST TECHNIQUE: Contiguous axial images were obtained from the base of the skull through the vertex without intravenous contrast. COMPARISON:  Head CT from 05/11/2021 FINDINGS: Brain: 5.5 x 2.8 cm hematoma in the subcortical high right frontal region with extensive vasogenic edema in the adjacent brain that is progressed. There is worsening local mass effect with midline shift now measuring 13 mm. No entrapment. No cytotoxic edema seen. Vascular: No hyperdense vessel or unexpected calcification. Skull: Normal. Negative for fracture or focal lesion. Sinuses/Orbits: Negative Other: Critical Value/emergent results were called by telephone at the time of interpretation on 05/23/2021 at 9:10 am to provider [facility] , who verbally acknowledged these results. ASPECTS [facility] Stroke Program Early CT Score) Not scored in this setting IMPRESSION: Progressed right frontal hematoma and vasogenic edema with midline shift now measuring 13 mm. Electronically Signed   By: Marnee Spring M.D.   On: 05/23/2021 09:12   CT HEAD CODE STROKE WO CONTRAST  Result Date: 05/11/2021 CLINICAL DATA:  Code stroke. Left-sided weakness, nausea, and vomiting. EXAM: CT HEAD WITHOUT CONTRAST TECHNIQUE: Contiguous axial images were obtained from the base of the skull through the vertex without  intravenous contrast. COMPARISON:  None. FINDINGS: Brain: An acute parenchymal hemorrhage in the high posterior right frontal lobe measures 4.2 x 2.3 x 3.1 cm (approximate volume of 15 mL). There is mild surrounding edema without midline shift, and there is a small amount of adjacent subarachnoid hemorrhage. A very small amount of coexistent subdural hemorrhage is also not excluded in this region. No acute infarct is identified elsewhere. The ventricles are normal in size. Hypodensities in the cerebral white matter bilaterally are nonspecific but compatible with mild chronic small vessel ischemic disease. Vascular: No hyperdense vessel. Skull: No fracture or suspicious osseous lesion. Sinuses/Orbits: Small osteoma in the left frontal sinus. Mild right and moderate left maxillary sinus mucosal thickening. Clear mastoid air cells. Unremarkable orbits. Other: None. ASPECTS [facility] Stroke Program Early CT Score) Not scored in the presence of acute hemorrhage. IMPRESSION: Acute parenchymal hemorrhage in the posterior right frontal lobe with small amount of adjacent subarachnoid hemorrhage. These results were communicated to Dr. Iver Nestle at 9:08 am on 05/11/2021 by text page via the [facility] messaging system. Electronically Signed   By: Sebastian Ache M.D.   On: 05/11/2021 09:12   VAS [place] LOWER EXTREMITY VENOUS (DVT)  Result Date: 05/26/2021  Lower Venous DVT Study Patient Name:  TYLYN DERWIN  Date of Exam:   05/26/2021 Medical Rec #: [id number]         Accession #:    [id number] Date of Birth: 1956/07/10          Patient Gender: M Patient Age:   65Y Exam Location:  [facility] Procedure:      VAS [place] LOWER EXTREMITY VENOUS (DVT) Referring Phys: [id number] Eliezer Champagne --------------------------------------------------------------------------------  Indications: Fever.  Limitations: Poor ultrasound/tissue interface. Comparison Study:  No previous exams. Performing Technologist: Ernestene Mention RVT, RDMS  Examination  Guidelines: A complete evaluation includes B-mode imaging, spectral Doppler, color Doppler, and power Doppler as needed of all accessible portions of each vessel. Bilateral testing is considered an integral part of a complete examination. Limited examinations for reoccurring indications may be performed as noted. The reflux portion of the exam is performed with the patient in reverse Trendelenburg.  +---------+---------------+---------+-----------+----------+--------------+ RIGHT    CompressibilityPhasicitySpontaneityPropertiesThrombus Aging +---------+---------------+---------+-----------+----------+--------------+ CFV      Full           Yes      Yes                                 +---------+---------------+---------+-----------+----------+--------------+ SFJ      Full                                                        +---------+---------------+---------+-----------+----------+--------------+ FV Prox  Full           Yes      Yes                                 +---------+---------------+---------+-----------+----------+--------------+ FV Mid   Full           Yes      Yes                                 +---------+---------------+---------+-----------+----------+--------------+ FV DistalFull           Yes      Yes                                 +---------+---------------+---------+-----------+----------+--------------+ PFV      Full                                                        +---------+---------------+---------+-----------+----------+--------------+ POP      Full           Yes      Yes                                 +---------+---------------+---------+-----------+----------+--------------+ PTV      Full                                                        +---------+---------------+---------+-----------+----------+--------------+ PERO     Full                                                         +---------+---------------+---------+-----------+----------+--------------+   +---------+---------------+---------+-----------+----------+-------------------+  LEFT     CompressibilityPhasicitySpontaneityPropertiesThrombus Aging      +---------+---------------+---------+-----------+----------+-------------------+ CFV      Partial        Yes      Yes                  Acute- non                                                                occlusive           +---------+---------------+---------+-----------+----------+-------------------+ SFJ      Full                                                             +---------+---------------+---------+-----------+----------+-------------------+ FV Prox  Full           Yes      Yes                                      +---------+---------------+---------+-----------+----------+-------------------+ FV Mid   Full           Yes      Yes                                      +---------+---------------+---------+-----------+----------+-------------------+ FV DistalFull           Yes      Yes                                      +---------+---------------+---------+-----------+----------+-------------------+ PFV      Full                                                             +---------+---------------+---------+-----------+----------+-------------------+ POP      Full           Yes      Yes                                      +---------+---------------+---------+-----------+----------+-------------------+ PTV                                                   Not well visualized +---------+---------------+---------+-----------+----------+-------------------+ PERO     Full                                                             +---------+---------------+---------+-----------+----------+-------------------+  EIV                     Yes      Yes                  Patent by color and                                                        doppler             +---------+---------------+---------+-----------+----------+-------------------+     Summary: BILATERAL: - No evidence of superficial venous thrombosis in the lower extremities, bilaterally. -No evidence of popliteal cyst, bilaterally. RIGHT: - There is no evidence of deep vein thrombosis in the lower extremity.  LEFT: - Findings consistent with acute deep vein thrombosis involving the left common femoral vein.  *See table(s) above for measurements and observations. Electronically signed by Coral Else MD on 05/26/2021 at 7:50:00 PM.    Final    CT ANGIO HEAD NECK W WO CM (CODE STROKE)  Result Date: 05/23/2021 CLINICAL DATA:  Enlarging hematoma EXAM: CT ANGIOGRAPHY HEAD AND NECK TECHNIQUE: Multidetector CT imaging of the head and neck was performed using the standard protocol during bolus administration of intravenous contrast. Multiplanar CT image reconstructions and MIPs were obtained to evaluate the vascular anatomy. Carotid stenosis measurements (when applicable) are obtained utilizing NASCET criteria, using the distal internal carotid diameter as the denominator. CONTRAST:  26mL OMNIPAQUE IOHEXOL 350 MG/ML SOLN COMPARISON:  CTA 05/11/2021 FINDINGS: CTA NECK FINDINGS Aortic arch: Normal Right carotid system: Widely patent vessels with smooth contour. No atheromatous changes Left carotid system: Widely patent vessels with smooth contour. No noted atheromatous changes Vertebral arteries: No proximal subclavian stenosis. The vertebral arteries are widely patent. No suspected vertebral beading when allowing for streak artifact. Skeleton: Multilevel degenerative disc narrowing and ridging canal and foraminal encroachment the lower cervical levels. Other neck: No evidence of inflammation or mass. Upper chest: Negative Review of the MIP images confirms the above findings CTA HEAD FINDINGS Anterior circulation: Major vessels are smooth and  widely patent. No branch occlusion, generalized beading, or aneurysm. There is a cortical branch at the right frontal parietal convexity which is not continuous beyond the level of the upper and lateral hematoma. No visible nidus or early enhancing veins. Posterior circulation: Vertebral and basilar arteries are smooth and widely patent. No branch occlusion, beading, or aneurysm. Venous sinuses: MRV was obtained previously. The veins are largely nonenhancing on this study. Anatomic variants: None significant Review of the MIP images confirms the above findings IMPRESSION: Seemingly truncated arterial cortical branch at the upper and lateral hematoma, without discrete vascular malformation or spot sign. Recommend follow-up after resolution of mass effect to evaluate for small AVM. Electronically Signed   By: Marnee Spring M.D.   On: 05/23/2021 09:54   CT ANGIO HEAD NECK W WO CM (CODE STROKE)  Result Date: 05/11/2021 CLINICAL DATA:  Stroke follow-up.  Left-sided weakness. EXAM: CT ANGIOGRAPHY HEAD AND NECK TECHNIQUE: Multidetector CT imaging of the head and neck was performed using the standard protocol during bolus administration of intravenous contrast. Multiplanar CT image reconstructions and MIPs were obtained to evaluate the vascular anatomy. Carotid stenosis measurements (when applicable) are obtained utilizing NASCET criteria, using the distal internal carotid diameter as the denominator. CONTRAST:  28mL  OMNIPAQUE IOHEXOL 350 MG/ML SOLN COMPARISON:  Same day CT head. FINDINGS: CTA NECK FINDINGS Aortic arch: Great vessel origins are patent. Right carotid system: No evidence of dissection, stenosis (50% or greater) or occlusion. Mild apparent irregularity of the ICA at the skull base in a region of streak artifact. Left carotid system: No evidence of dissection, stenosis (50% or greater) or occlusion. Mild apparent irregularity of the ICA at the skull base in a region of streak artifact. Vertebral arteries:  Codominant. No evidence of dissection, stenosis (50% or greater) or occlusion. Skeleton: Moderate degenerative disc disease at at C5-C6 and C6-C7 with disc height loss, endplate sclerosis and posterior disc osteophyte complexes. Other neck: No acute abnormality Upper chest: Visualized lung apices are clear. Review of the MIP images confirms the above findings CTA HEAD FINDINGS Anterior circulation: No large vessel occlusion or proximal hemodynamically significant stenosis. Evaluation of the distal vasculature is limited due to venous contamination. No evidence of and arteriovenous malformation or aneurysm in the region of intraparenchymal hemorrhage, although acute blood products limit evaluation. Posterior circulation: No large vessel occlusion or proximal hemodynamically significant stenosis. Venous sinuses: The superior sagittal sinus is narrowed in the region of hemorrhage, most likely from mass effect. Small right transverse and sigmoid sinuses. Review of the MIP images confirms the above findings IMPRESSION: CTA Head: 1. No large vessel occlusion or hemodynamically significant proximal arterial stenosis. 2. The superior sagittal sinus is narrowed in the region of hemorrhage with poorly visualized draining cortical veins in this region. While indeterminate, this may be secondary to mass effect given no definite/clear intraluminal thrombus. Given these findings and the location of hemorrhage; however, recommend low threshold for follow-up CTV or MRI with contrast to ensure stability and exclude worsening thrombosis. 3. No evidence of and arteriovenous malformation or aneurysm in the region of intraparenchymal hemorrhage, although acute blood products limit evaluation. Follow-up imaging after resolution of hemorrhage could provide further assessment if clinically indicated. CTA Neck: 1. No significant (greater than 50%) stenosis. 2. Mild apparent irregularity of bilateral ICAs at the skull base is favored  artifactual given streak artifact from dental amalgam in this region. Fibromuscular dysplasia is a differential consideration. Findings and recommendations discussed with Dr. Iver Nestle via telephone at 9:59 a.m. 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 EXAM: BILATERAL COMMON CAROTID AND INNOMINATE ANGIOGRAPHY COMPARISON:  CT angiogram of the head and neck of May 23, 2021, and May 11, 2021. MEDICATIONS: Heparin none. No antibiotic was administered within 1 hour of the procedure. ANESTHESIA/SEDATION: Versed 1.5 mg IV; Fentanyl 37.5 mcg IV Moderate Sedation Time:  52 minutes The patient was continuously monitored during the procedure by the interventional radiology nurse under my direct supervision. CONTRAST:  Isovue 300 approximately 80 mL. FLUOROSCOPY TIME:  Fluoroscopy Time: 15 minutes 24 seconds (1503 mGy). COMPLICATIONS: None immediate. TECHNIQUE: Informed written consent was obtained from the patient after a thorough discussion of the procedural risks, benefits and alternatives. All questions were addressed. Maximal Sterile Barrier Technique was utilized including caps, mask, sterile gowns, sterile gloves, sterile drape, hand hygiene and skin antiseptic. A timeout was performed prior to the initiation of the procedure. The right groin was prepped and draped in the usual sterile fashion. Thereafter using modified Seldinger technique, transfemoral access into the right common femoral artery was obtained without  difficulty. Over a 0.035 inch guidewire, a 5 French Pinnacle sheath was inserted. Through this, and also over 0.035 inch guidewire, a 5 [place] JB 1 catheter was advanced to the aortic arch region and selectively positioned in the right common carotid artery, the right vertebral artery, the left common carotid  artery and the left vertebral artery. FINDINGS: The right vertebral artery origin is widely patent. The vessel is seen to opacify to the cranial skull base. Wide opacification is seen of right vertebrobasilar junction and the right posterior-inferior cerebellar artery. The basilar artery, the posterior cerebral arteries, the superior cerebellar arteries and the anterior-inferior cerebellar arteries opacify into the capillary and venous phases. Venous phase demonstrates non opacification of the right transverse sinus extending from the right lateral origin of the torcula to the transverse sigmoid junction. Retrograde opacification of the sigmoid sinus is seen from the superior cerebellar veins with retrograde flow into the right transverse sinus sigmoid junction. The right common carotid arteriogram demonstrates the right external carotid artery and its major branches to be widely patent. The right internal carotid artery at the bulb to the cranial skull base is widely patent. The petrous, the cavernous and the supraclinoid segments are widely patent. The right middle cerebral artery is patent into the anterior temporal branch and the bifurcation branches. Bundling of the right middle cerebral artery M3 M4 branch is noted secondary to the superiorly and the medially located intracranial hemorrhage. The right anterior cerebral artery opacifies into the capillary and venous phases. The venous phase demonstrates a minimally opacified right transverse sinus. The delayed lateral projection of the DSA demonstrates delayed clearance of the arterial portion of the contrast compared to the venous. The left common carotid arteriogram demonstrates the left external carotid artery and its major branches to be widely patent. The left internal carotid artery at the bulb to the cranial skull base is widely patent. The petrous, the cavernous and the supraclinoid segments are widely patent. The left middle cerebral and the left  anterior cerebral artery opacify into the capillary and venous phases. Left vertebral origin is widely patent. The vessel is seen to opacify to the cranial skull base. The left vertebrobasilar junction and the left posterior-inferior cerebral artery demonstrate wide patency. The basilar artery, and the opacified portions of the posterior cerebral arteries, the superior cerebellar arteries and the anterior-inferior cerebellar arteries is seen into the capillary and the venous phases. The venous phase again demonstrates non opacification of the right transverse sinus with suggestion of small dural veins. Again demonstrated is the retrograde opacification of the right sigmoid sinus transverse sinus junction from the ipsilateral superior cerebellar venous structures. IMPRESSION: Non opacification of the right transverse sinus extending from the torcula to the right transverse sinus/sigmoid sinus junction with retrograde opacification of the right sigmoid sinus from the superior cerebellar veins on the on the right side. Above findings supportive of venous hypertension with delayed clearance of contrast from the right cerebral hemisphere, and also with retrograde opacification of the right sigmoid sinus from the right superior cerebellar hemispheric veins. Attenuated caliber of the right sigmoid sinus noted though the right internal jugular vein appears comparable in size compared to the contralateral left side. Angiographically no evidence of intracranial aneurysm arteriovenous shunting, or of arteriovenous malformation or dissections. PLAN: Follow-up MRA of the brain, and MRV of the dural venous sinuses with and without contrast in 3 months. Electronically Signed   By: Julieanne Cotton M.D.   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 EXAM: BILATERAL COMMON CAROTID AND INNOMINATE  ANGIOGRAPHY COMPARISON:  CT angiogram of the head and neck of May 23, 2021, and May 11, 2021. MEDICATIONS: Heparin none. No antibiotic was administered within 1 hour of the procedure. ANESTHESIA/SEDATION: Versed 1.5 mg IV; Fentanyl 37.5 mcg IV Moderate Sedation Time:  52 minutes The patient was continuously monitored during the procedure by the interventional radiology nurse under my direct supervision. CONTRAST:  Isovue 300 approximately 80 mL. FLUOROSCOPY TIME:  Fluoroscopy Time: 15 minutes 24 seconds (1503 mGy). COMPLICATIONS: None immediate. TECHNIQUE: Informed written consent was obtained from the patient after a thorough discussion of the procedural risks, benefits and alternatives. All questions were addressed. Maximal Sterile Barrier Technique was utilized including caps, mask, sterile gowns, sterile gloves, sterile drape, hand hygiene and skin antiseptic. A timeout was performed prior to the initiation of the procedure. The right groin was prepped and draped in the usual sterile fashion. Thereafter using modified Seldinger technique, transfemoral access into the right common femoral artery was obtained without difficulty. Over a 0.035 inch guidewire, a 5 French Pinnacle sheath was inserted. Through this, and also over 0.035 inch guidewire, a 5 [place] JB 1 catheter was advanced to the aortic arch region and selectively positioned in the right common carotid artery, the right vertebral artery, the left common carotid artery and the left vertebral artery. FINDINGS: The right vertebral artery origin is widely patent. The vessel is seen to opacify to the cranial skull base. Wide opacification is seen of right vertebrobasilar junction and the right posterior-inferior cerebellar artery. The basilar artery, the posterior cerebral arteries, the superior cerebellar arteries and the anterior-inferior cerebellar arteries opacify into the capillary and venous phases. Venous phase demonstrates non opacification of the  right transverse sinus extending from the right lateral origin of the torcula to the transverse sigmoid junction. Retrograde opacification of the sigmoid sinus is seen from the superior cerebellar veins with retrograde flow into the right transverse sinus sigmoid junction. The right common carotid arteriogram demonstrates the right external carotid artery and its major branches to be widely patent. The right internal carotid artery at the bulb to the cranial skull base is widely patent. The petrous, the cavernous and the supraclinoid segments are widely patent. The right middle cerebral artery is patent into the anterior temporal branch and the bifurcation branches. Bundling of the right middle cerebral artery M3 M4 branch is noted secondary to the superiorly and the medially located intracranial hemorrhage. The right anterior cerebral artery opacifies into the capillary and venous phases. The venous phase demonstrates a minimally opacified right transverse sinus. The delayed lateral projection of the DSA demonstrates delayed clearance of the arterial portion of the contrast compared to the venous. The left common carotid arteriogram demonstrates the left external carotid artery and its major branches to be widely patent. The left internal carotid artery at the bulb to the cranial skull base is widely patent. The petrous, the cavernous and the supraclinoid segments are widely patent. The left middle cerebral and the left anterior cerebral artery opacify into the capillary and venous phases. Left vertebral origin is widely patent. The vessel is seen to opacify to the cranial skull base. The left vertebrobasilar junction and the left posterior-inferior cerebral artery demonstrate wide patency. The basilar artery, and the opacified portions of the posterior cerebral arteries, the superior cerebellar arteries and the anterior-inferior cerebellar arteries is seen into  the capillary and the venous phases. The venous phase  again demonstrates non opacification of the right transverse sinus with suggestion of small dural veins. Again demonstrated is the retrograde opacification of the right sigmoid sinus transverse sinus junction from the ipsilateral superior cerebellar venous structures. IMPRESSION: Non opacification of the right transverse sinus extending from the torcula to the right transverse sinus/sigmoid sinus junction with retrograde opacification of the right sigmoid sinus from the superior cerebellar veins on the on the right side. Above findings supportive of venous hypertension with delayed clearance of contrast from the right cerebral hemisphere, and also with retrograde opacification of the right sigmoid sinus from the right superior cerebellar hemispheric veins. Attenuated caliber of the right sigmoid sinus noted though the right internal jugular vein appears comparable in size compared to the contralateral left side. Angiographically no evidence of intracranial aneurysm arteriovenous shunting, or of arteriovenous malformation or dissections. PLAN: Follow-up MRA of the brain, and MRV of the dural venous sinuses with and without contrast in 3 months. Electronically Signed   By: Julieanne Cotton M.D.   On: 05/28/2021 16:01     PHYSICAL EXAM  Temp:  [98 F (36.7 C)-99.3 F (37.4 C)] 98.2 F (36.8 C) (06/23 1128) Pulse Rate:  [91-98] 91 (06/23 1128) Resp:  [16-19] 18 (06/23 1128) BP: (118-131)/(70-85) 121/75 (06/23 1128) SpO2:  [94 %-100 %] 100 % (06/23 1128)  General - Well nourished, well developed, middle-aged African-American male sleepy but arousable.  Ophthalmologic - fundi not visualized due to noncooperation.  Cardiovascular - Regular rhythm and mild tachycardia.  Neuro - sleepy but eyes open with voice, he was able to follow peripheral commands on the right hand and most central commands.  Moderate dysarthria, oriented to place and people age but not to time. Right gaze preference, but unable to  cross midline. Not blinking to visual threat left greater than right. Left facial droop. Tongue protrusion not cooperative. Right UE spontaneous movement against gravity and RLE some spontaneous movement but not against gravity, LUE and LLE plegic with increased muscle tone. Sensation, coordination not cooperative and gait not tested.   ASSESSMENT/PLAN Mr. Taras Rask is a 65 y.o. male with history of dementia with behavioral disturbance and OSA on CPAP admitted for right gaze and left hemoplegia, left facial droop. 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 He was stabilized and sent to CIR. 6/12 found to be lethargic and drowsy, CT repeat showed increased cerebral edema and midline shift. EEG no seizure s/p ativan and keppra. He was transfer back to ICU for further management.   Encephalopathy  Improving Likely combination of cerebral edema, ? Seiziure, fever, leukocytosis and AKI Treat underlying condition as below Neuro check closely  Cerebral edema  CT head 6/12 showed progression of right frontal ICH and cerebral edema with midline shift CTA head and neck 6/12 no discrete AVM or spot sign CT repeat 6/16 unchanged, no progression of midline shift or hematoma. On 3% saline 50->65->30 cc/h->off Na Q6h Na 135->140->144->151->158->155->156->157>158 Na goal 145-155  ? 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 No CSVT on venous phase CT repeat mildly increased left  frontal ICH 05/12/21 MRI brain no significant change of ICH since last CT MRV Non dominant right transverse and sigmoid dural venous sinuses. CT repeat 6/16 unchanged, no  progression of midline shift or hematoma. Cerebral angiogram no AVM or aneurysm, concerning for Poor to no sig opacification of RT transverse sinus in its entirety, ?Suspicious for thrombotic occlusion. 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  No evidence of clot on venous sinus thrombosis.   2D Echo  EF 65-70% LDL 140 HgbA1c 5.8 UDS neg Heparin subq for VTE prophylaxis No antithrombotic prior to admission, now on No antithrombotic given ICH Ongoing aggressive stroke risk factor management Therapy recommendations:  pending Disposition:  Pending   ?? 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  DVT LE venous Doppler showed left common femoral vein DVT Not a candidate for anticoagulation at this time S/p IVC filter 6/16  Dementia  Has appointment with [facility] for LP and evaluation of dementia, but pt not able to go due to onset of ICH Delirium precaution Continue outpt follow up with [facility]  Hyperlipidemia Home meds:  none  LDL 140, goal < 70 Will consider low dose statin (lipitor 20) on discharge   Dysphagia  Speech to follow NPO  S/p cortrak on TF @ 50  Fever and leukocytosis Tmax 101.4->102.6->100.9->102.4->101.7->afebrile Leukocytosis WBC 7.6->12.4 ->8.1->7.8->22.9->20.4->24.8-> 23.9->17.7 CCM on board Continue monitoring UA neg CXR  Minimal left basilar atelectasis On Rocephin now  Other Stroke Risk Factors Advanced age Obstructive sleep apnea, on CPAP at home  Other Active Problems AKI Cre 1.20->1.45->1.03->1.02->0.98->1.09 - on IVF and TF Sleepiness and lethargy-Keppra DC'd 06/01/2021 and amantadine added  Hospital day # 11 Patient continues to show  progress.  And has shown response to   amantadine to promote wakefulness.   Continue ongoing therapies and continue mobilization out of bed as tolerated  .  Speech therapy to check swallow evaluation and transfer to inpatient rehab when bed available..  Long discussion with patient and wife at the bedside and brother over the phone answered questions.  Discussed with Dr. Ella Jubilee.  Stroke team will sign off.  Kindly call for questions.  Long discussion with patient and wife and answered questions Greater than 50% time during this 25-minute visit was spent on counseling and coordination of care about his dysphagia and stroke and answering questions.  Delia Heady, MD   06/03/2021 1:36 PM    To contact Stroke Continuity provider, please refer to [email or web address]. After hours, contact General Neurology

## 2021-06-03 NOTE — Progress Notes (Signed)
  Speech Language Pathology Treatment: Dysphagia;Cognitive-Linquistic  Patient Details Name: Donald [name] MRN: [id number] DOB: [birth year] Today's Date: 06/03/2021 Time: 5784-6962 SLP Time Calculation (min) (ACUTE ONLY): 14 min  Assessment / Plan / Recommendation Clinical Impression  Monday and Tuesday wife states pt was not awake and Keppra now stopped and he is starting to wake up more. Jamoni was drowsy but awake and followed simple commands with cues needed for attention. Head turned to right and brings to midline with tactile assist and eyes to his right. Hand over hand to initiate brushing teeth, moved brush bilaterally after cues with difficulty stopping task due to frontal lobe involvement.  Lips remain abducted and closes to functional introduction of spoon or [place] not volitionally. From a respiratory, oropharyngeal sensory-motor standpoint, impairments are significant marked by open mouth posture, wet vocal quality, decreased reflexive swallow and likely aspiration. Trials of ice and 1/2 tsp water given without swallow response. Few times he attempted to swallow were audible, loud intensity indicating likely weak and incomplete swallows. Educated wife ST will continue toward po goal however longer term nutrition in place of Cortrak is likely.    HPI HPI: Donald [name] is a 65 y.o. male with a medical history significant for dementia with behavioral disturbance, OSA, arthritis  admitted 5/31 with acute parenchymal hemorrhage in the posterior right frontal lobe with small amount of adjacent subarachnoid hemorrhage. Seen by ST and progressed from puree/thin to Dys 2. Transferred to CIR and experienced increased stroke symptoms 6/12 morning. CT revealed progressed right frontal hematoma and vasogenic edema with midline shift now measuring 13 mm and transferred to acute. Per chart and wife history he had some short term memory deficits and evaluated and diagnosed with mild cognitive  deficits at [facility].      SLP Plan  Continue with current plan of care       Recommendations  Diet recommendations: NPO Medication Administration: Via alternative means                Oral Care Recommendations: Oral care QID Follow up Recommendations: Skilled Nursing facility SLP Visit Diagnosis: Dysphagia, unspecified (R13.10) Plan: Continue with current plan of care                      [name] 06/03/2021, 9:54 AM  [name] [name].Ed Nurse, children's [phone] Office [phone]

## 2021-06-03 NOTE — Progress Notes (Addendum)
Inpatient Rehab Admissions Coordinator:   Met with patient and his spouse at the bedside.  Pt is much more alert today and responding appropriately to my questions with gestures and nods.  I let them know that we have insurance auth and could potentially admit today.  I spoke to Dr. Cathlean Sauer who will assess pt for possible admit to CIR today and I will f/u with family as soon as I hear back.    1324: Update from Dr. Cathlean Sauer, pt should be ready to admit to CIR tomorrow as long as he stays stable.  Will f/u in the AM.   Shann Medal, PT, DPT Admissions Coordinator [phone] 06/03/21  1:06 PM

## 2021-06-04 ENCOUNTER — Other Ambulatory Visit: Payer: Self-pay

## 2021-06-04 ENCOUNTER — Encounter (HOSPITAL_COMMUNITY): Payer: Self-pay | Admitting: Physical Medicine & Rehabilitation

## 2021-06-04 ENCOUNTER — Inpatient Hospital Stay (HOSPITAL_COMMUNITY)
Admission: RE | Admit: 2021-06-04 | Discharge: 2021-07-12 | DRG: 056 | Disposition: A | Discharge status: Home Health | Payer: No Typology Code available for payment source | Source: Intra-hospital | Attending: Physical Medicine & Rehabilitation | Admitting: Physical Medicine & Rehabilitation

## 2021-06-04 ENCOUNTER — Inpatient Hospital Stay (HOSPITAL_COMMUNITY): Payer: No Typology Code available for payment source

## 2021-06-04 DIAGNOSIS — R7303 Prediabetes: Secondary | ICD-10-CM | POA: Diagnosis present

## 2021-06-04 DIAGNOSIS — I69198 Other sequelae of nontraumatic intracerebral hemorrhage: Secondary | ICD-10-CM

## 2021-06-04 DIAGNOSIS — E876 Hypokalemia: Secondary | ICD-10-CM | POA: Diagnosis not present

## 2021-06-04 DIAGNOSIS — R413 Other amnesia: Secondary | ICD-10-CM | POA: Diagnosis present

## 2021-06-04 DIAGNOSIS — R54 Age-related physical debility: Secondary | ICD-10-CM | POA: Diagnosis present

## 2021-06-04 DIAGNOSIS — F0390 Unspecified dementia without behavioral disturbance: Secondary | ICD-10-CM | POA: Diagnosis present

## 2021-06-04 DIAGNOSIS — I82413 Acute embolism and thrombosis of femoral vein, bilateral: Secondary | ICD-10-CM | POA: Diagnosis present

## 2021-06-04 DIAGNOSIS — H539 Unspecified visual disturbance: Secondary | ICD-10-CM | POA: Diagnosis present

## 2021-06-04 DIAGNOSIS — I69192 Facial weakness following nontraumatic intracerebral hemorrhage: Secondary | ICD-10-CM

## 2021-06-04 DIAGNOSIS — R7401 Elevation of levels of liver transaminase levels: Secondary | ICD-10-CM | POA: Diagnosis present

## 2021-06-04 DIAGNOSIS — K567 Ileus, unspecified: Secondary | ICD-10-CM | POA: Diagnosis not present

## 2021-06-04 DIAGNOSIS — E871 Hypo-osmolality and hyponatremia: Secondary | ICD-10-CM | POA: Diagnosis not present

## 2021-06-04 DIAGNOSIS — S06360A Traumatic hemorrhage of cerebrum, unspecified, without loss of consciousness, initial encounter: Secondary | ICD-10-CM

## 2021-06-04 DIAGNOSIS — I69119 Unspecified symptoms and signs involving cognitive functions following nontraumatic intracerebral hemorrhage: Secondary | ICD-10-CM | POA: Diagnosis not present

## 2021-06-04 DIAGNOSIS — S06340S Traumatic hemorrhage of right cerebrum without loss of consciousness, sequela: Secondary | ICD-10-CM | POA: Diagnosis not present

## 2021-06-04 DIAGNOSIS — Z09 Encounter for follow-up examination after completed treatment for conditions other than malignant neoplasm: Secondary | ICD-10-CM

## 2021-06-04 DIAGNOSIS — R638 Other symptoms and signs concerning food and fluid intake: Secondary | ICD-10-CM | POA: Diagnosis not present

## 2021-06-04 DIAGNOSIS — Z9189 Other specified personal risk factors, not elsewhere classified: Secondary | ICD-10-CM

## 2021-06-04 DIAGNOSIS — Z6820 Body mass index (BMI) 20.0-20.9, adult: Secondary | ICD-10-CM | POA: Diagnosis not present

## 2021-06-04 DIAGNOSIS — S06349S Traumatic hemorrhage of right cerebrum with loss of consciousness of unspecified duration, sequela: Secondary | ICD-10-CM | POA: Diagnosis not present

## 2021-06-04 DIAGNOSIS — D62 Acute posthemorrhagic anemia: Secondary | ICD-10-CM | POA: Diagnosis present

## 2021-06-04 DIAGNOSIS — D72829 Elevated white blood cell count, unspecified: Secondary | ICD-10-CM | POA: Diagnosis not present

## 2021-06-04 DIAGNOSIS — R000 Tachycardia, unspecified: Secondary | ICD-10-CM | POA: Diagnosis present

## 2021-06-04 DIAGNOSIS — Z95828 Presence of other vascular implants and grafts: Secondary | ICD-10-CM

## 2021-06-04 DIAGNOSIS — E440 Moderate protein-calorie malnutrition: Secondary | ICD-10-CM | POA: Diagnosis present

## 2021-06-04 DIAGNOSIS — M1990 Unspecified osteoarthritis, unspecified site: Secondary | ICD-10-CM | POA: Diagnosis present

## 2021-06-04 DIAGNOSIS — I69154 Hemiplegia and hemiparesis following nontraumatic intracerebral hemorrhage affecting left non-dominant side: Secondary | ICD-10-CM | POA: Diagnosis present

## 2021-06-04 DIAGNOSIS — K560 Paralytic ileus: Secondary | ICD-10-CM | POA: Diagnosis not present

## 2021-06-04 DIAGNOSIS — I69191 Dysphagia following nontraumatic intracerebral hemorrhage: Secondary | ICD-10-CM | POA: Diagnosis not present

## 2021-06-04 DIAGNOSIS — Z515 Encounter for palliative care: Secondary | ICD-10-CM | POA: Diagnosis not present

## 2021-06-04 DIAGNOSIS — Z7189 Other specified counseling: Secondary | ICD-10-CM | POA: Diagnosis not present

## 2021-06-04 DIAGNOSIS — I619 Nontraumatic intracerebral hemorrhage, unspecified: Secondary | ICD-10-CM | POA: Diagnosis present

## 2021-06-04 DIAGNOSIS — R2689 Other abnormalities of gait and mobility: Secondary | ICD-10-CM | POA: Diagnosis present

## 2021-06-04 DIAGNOSIS — R739 Hyperglycemia, unspecified: Secondary | ICD-10-CM | POA: Diagnosis present

## 2021-06-04 DIAGNOSIS — S06310S Contusion and laceration of right cerebrum without loss of consciousness, sequela: Secondary | ICD-10-CM

## 2021-06-04 DIAGNOSIS — J690 Pneumonitis due to inhalation of food and vomit: Secondary | ICD-10-CM | POA: Diagnosis present

## 2021-06-04 DIAGNOSIS — R109 Unspecified abdominal pain: Secondary | ICD-10-CM

## 2021-06-04 DIAGNOSIS — R1310 Dysphagia, unspecified: Secondary | ICD-10-CM | POA: Diagnosis present

## 2021-06-04 DIAGNOSIS — S0633AA Contusion and laceration of cerebrum, unspecified, with loss of consciousness status unknown, initial encounter: Secondary | ICD-10-CM

## 2021-06-04 DIAGNOSIS — R1312 Dysphagia, oropharyngeal phase: Secondary | ICD-10-CM | POA: Diagnosis not present

## 2021-06-04 DIAGNOSIS — I69120 Aphasia following nontraumatic intracerebral hemorrhage: Secondary | ICD-10-CM

## 2021-06-04 DIAGNOSIS — R52 Pain, unspecified: Secondary | ICD-10-CM

## 2021-06-04 DIAGNOSIS — I611 Nontraumatic intracerebral hemorrhage in hemisphere, cortical: Secondary | ICD-10-CM | POA: Diagnosis not present

## 2021-06-04 DIAGNOSIS — I82411 Acute embolism and thrombosis of right femoral vein: Secondary | ICD-10-CM | POA: Diagnosis present

## 2021-06-04 DIAGNOSIS — I610 Nontraumatic intracerebral hemorrhage in hemisphere, subcortical: Secondary | ICD-10-CM | POA: Diagnosis not present

## 2021-06-04 DIAGNOSIS — D638 Anemia in other chronic diseases classified elsewhere: Secondary | ICD-10-CM | POA: Diagnosis present

## 2021-06-04 DIAGNOSIS — I82412 Acute embolism and thrombosis of left femoral vein: Secondary | ICD-10-CM | POA: Diagnosis not present

## 2021-06-04 DIAGNOSIS — G8112 Spastic hemiplegia affecting left dominant side: Secondary | ICD-10-CM | POA: Diagnosis not present

## 2021-06-04 LAB — CBC WITH DIFFERENTIAL/PLATELET
Abs Immature Granulocytes: 0.09 10*3/uL — ABNORMAL HIGH (ref 0.00–0.07)
Basophils Absolute: 0 10*3/uL (ref 0.0–0.1)
Basophils Relative: 0 %
Eosinophils Absolute: 0.1 10*3/uL (ref 0.0–0.5)
Eosinophils Relative: 1 %
HCT: 38 % — ABNORMAL LOW (ref 39.0–52.0)
Hemoglobin: 12.5 g/dL — ABNORMAL LOW (ref 13.0–17.0)
Immature Granulocytes: 1 %
Lymphocytes Relative: 12 %
Lymphs Abs: 1.3 10*3/uL (ref 0.7–4.0)
MCH: 30.1 pg (ref 26.0–34.0)
MCHC: 32.9 g/dL (ref 30.0–36.0)
MCV: 91.6 fL (ref 80.0–100.0)
Monocytes Absolute: 0.6 10*3/uL (ref 0.1–1.0)
Monocytes Relative: 5 %
Neutro Abs: 9.1 10*3/uL — ABNORMAL HIGH (ref 1.7–7.7)
Neutrophils Relative %: 81 %
Platelets: 230 10*3/uL (ref 150–400)
RBC: 4.15 MIL/uL — ABNORMAL LOW (ref 4.22–5.81)
RDW: 13.5 % (ref 11.5–15.5)
WBC: 11.2 10*3/uL — ABNORMAL HIGH (ref 4.0–10.5)
nRBC: 0 % (ref 0.0–0.2)

## 2021-06-04 LAB — GLUCOSE, CAPILLARY
Glucose-Capillary: 107 mg/dL — ABNORMAL HIGH (ref 70–99)
Glucose-Capillary: 115 mg/dL — ABNORMAL HIGH (ref 70–99)
Glucose-Capillary: 130 mg/dL — ABNORMAL HIGH (ref 70–99)
Glucose-Capillary: 146 mg/dL — ABNORMAL HIGH (ref 70–99)
Glucose-Capillary: 154 mg/dL — ABNORMAL HIGH (ref 70–99)

## 2021-06-04 LAB — BASIC METABOLIC PANEL
Anion gap: 7 (ref 5–15)
BUN: 17 mg/dL (ref 8–23)
CO2: 23 mmol/L (ref 22–32)
Calcium: 8.5 mg/dL — ABNORMAL LOW (ref 8.9–10.3)
Chloride: 106 mmol/L (ref 98–111)
Creatinine, Ser: 0.79 mg/dL (ref 0.61–1.24)
GFR, Estimated: >60 mL/min (ref >60)
Glucose, Bld: 115 mg/dL — ABNORMAL HIGH (ref 70–99)
Potassium: 4.4 mmol/L (ref 3.5–5.1)
Sodium: 136 mmol/L (ref 135–145)

## 2021-06-04 MED ORDER — TRAZODONE HCL 50 MG PO TABS
25.0000 mg | ORAL_TABLET | Freq: Every evening | ORAL | Status: DC | PRN
Start: 2021-06-04 — End: 2021-07-12
  Administered 2021-06-17 – 2021-07-01 (×4): 50 mg via ORAL
  Filled 2021-06-04 (×4): qty 1

## 2021-06-04 MED ORDER — DIPHENHYDRAMINE HCL 12.5 MG/5ML PO ELIX: 12.5000 mg | ORAL_SOLUTION | Freq: Four times a day (QID) | ORAL | Status: DC | PRN

## 2021-06-04 MED ORDER — PROSOURCE TF PO LIQD
45.0000 mL | Freq: Every day | ORAL | Status: DC
  Administered 2021-06-05 – 2021-06-07 (×3): 45 mL
  Filled 2021-06-04 (×3): qty 45

## 2021-06-04 MED ORDER — POLYETHYLENE GLYCOL 3350 17 G PO PACK
17.0000 g | PACK | Freq: Every day | ORAL | Status: DC | PRN
  Administered 2021-06-28 (×2): 17 g via ORAL
  Filled 2021-06-04 (×2): qty 1

## 2021-06-04 MED ORDER — ORAL CARE MOUTH RINSE
15.0000 mL | Freq: Two times a day (BID) | OROMUCOSAL | Status: DC
  Administered 2021-06-04 – 2021-07-11 (×57): 15 mL via OROMUCOSAL

## 2021-06-04 MED ORDER — ACETAMINOPHEN 325 MG PO TABS: 325.0000 mg | ORAL_TABLET | ORAL | Status: DC | PRN

## 2021-06-04 MED ORDER — PROCHLORPERAZINE MALEATE 5 MG PO TABS
5.0000 mg | ORAL_TABLET | Freq: Four times a day (QID) | ORAL | Status: DC | PRN
Start: 2021-06-04 — End: 2021-07-12

## 2021-06-04 MED ORDER — PROCHLORPERAZINE EDISYLATE 10 MG/2ML IJ SOLN: 5.0000 mg | Freq: Four times a day (QID) | INTRAMUSCULAR | Status: DC | PRN

## 2021-06-04 MED ORDER — SODIUM CHLORIDE 0.9 % IV SOLN
3.0000 g | Freq: Four times a day (QID) | INTRAVENOUS | Status: DC
  Administered 2021-06-04 – 2021-06-10 (×22): 3 g via INTRAVENOUS
  Filled 2021-06-04 (×4): qty 3
  Filled 2021-06-04: qty 8
  Filled 2021-06-04 (×3): qty 3
  Filled 2021-06-04 (×2): qty 8
  Filled 2021-06-04 (×8): qty 3
  Filled 2021-06-04: qty 8
  Filled 2021-06-04 (×4): qty 3
  Filled 2021-06-04: qty 8
  Filled 2021-06-04 (×2): qty 3

## 2021-06-04 MED ORDER — CHLORHEXIDINE GLUCONATE CLOTH 2 % EX PADS
6.0000 | MEDICATED_PAD | Freq: Every day | CUTANEOUS | Status: DC
  Administered 2021-06-09 – 2021-06-11 (×3): 6 via TOPICAL

## 2021-06-04 MED ORDER — GUAIFENESIN-DM 100-10 MG/5ML PO SYRP
5.0000 mL | ORAL_SOLUTION | Freq: Four times a day (QID) | ORAL | Status: DC | PRN
  Administered 2021-06-09: 10 mL via ORAL
  Filled 2021-06-04: qty 10

## 2021-06-04 MED ORDER — FREE WATER
250.0000 mL | Status: DC
  Administered 2021-06-04 – 2021-06-15 (×63): 250 mL

## 2021-06-04 MED ORDER — PROCHLORPERAZINE 25 MG RE SUPP: 12.5000 mg | Freq: Four times a day (QID) | RECTAL | Status: DC | PRN

## 2021-06-04 MED ORDER — INSULIN ASPART 100 UNIT/ML IJ SOLN
0.0000 [IU] | INTRAMUSCULAR | Status: DC
  Administered 2021-06-04: 1 [IU] via SUBCUTANEOUS
  Administered 2021-06-05 (×4): 2 [IU] via SUBCUTANEOUS
  Administered 2021-06-05: 1 [IU] via SUBCUTANEOUS
  Administered 2021-06-06 (×2): 2 [IU] via SUBCUTANEOUS
  Administered 2021-06-06: 1 [IU] via SUBCUTANEOUS
  Administered 2021-06-06 – 2021-06-07 (×4): 2 [IU] via SUBCUTANEOUS
  Administered 2021-06-07: 1 [IU] via SUBCUTANEOUS
  Administered 2021-06-07 – 2021-06-08 (×4): 2 [IU] via SUBCUTANEOUS
  Administered 2021-06-08: 1 [IU] via SUBCUTANEOUS
  Administered 2021-06-08: 2 [IU] via SUBCUTANEOUS
  Administered 2021-06-08: 1 [IU] via SUBCUTANEOUS
  Administered 2021-06-09: 3 [IU] via SUBCUTANEOUS
  Administered 2021-06-09 (×3): 2 [IU] via SUBCUTANEOUS
  Administered 2021-06-09: 1 [IU] via SUBCUTANEOUS
  Administered 2021-06-10: 2 [IU] via SUBCUTANEOUS
  Administered 2021-06-10: 1 [IU] via SUBCUTANEOUS
  Administered 2021-06-10 (×2): 2 [IU] via SUBCUTANEOUS
  Administered 2021-06-11: 1 [IU] via SUBCUTANEOUS
  Administered 2021-06-11: 3 [IU] via SUBCUTANEOUS
  Administered 2021-06-11 – 2021-06-12 (×2): 1 [IU] via SUBCUTANEOUS
  Administered 2021-06-12: 2 [IU] via SUBCUTANEOUS

## 2021-06-04 MED ORDER — CHLORHEXIDINE GLUCONATE 0.12 % MT SOLN
15.0000 mL | Freq: Two times a day (BID) | OROMUCOSAL | Status: DC
  Administered 2021-06-04 – 2021-07-11 (×67): 15 mL via OROMUCOSAL
  Filled 2021-06-04 (×62): qty 15

## 2021-06-04 MED ORDER — BISACODYL 10 MG RE SUPP
10.0000 mg | Freq: Every day | RECTAL | Status: DC | PRN
  Administered 2021-06-30 – 2021-07-04 (×2): 10 mg via RECTAL
  Filled 2021-06-04 (×2): qty 1

## 2021-06-04 MED ORDER — CHLORHEXIDINE GLUCONATE 0.12 % MT SOLN
15.0000 mL | Freq: Two times a day (BID) | OROMUCOSAL | Status: DC
  Administered 2021-06-05 – 2021-06-14 (×14): 15 mL via OROMUCOSAL
  Filled 2021-06-04 (×24): qty 15

## 2021-06-04 MED ORDER — INSULIN GLARGINE 100 UNIT/ML ~~LOC~~ SOLN
5.0000 [IU] | Freq: Every day | SUBCUTANEOUS | Status: DC
  Administered 2021-06-05 – 2021-06-10 (×6): 5 [IU] via SUBCUTANEOUS
  Filled 2021-06-04 (×6): qty 0.05

## 2021-06-04 MED ORDER — FLEET ENEMA 7-19 GM/118ML RE ENEM
1.0000 | ENEMA | Freq: Once | RECTAL | Status: AC | PRN
  Administered 2021-07-04: 1 via RECTAL
  Filled 2021-06-04: qty 1

## 2021-06-04 MED ORDER — AMANTADINE HCL 50 MG/5ML PO SOLN
100.0000 mg | Freq: Two times a day (BID) | ORAL | Status: DC
  Administered 2021-06-04 – 2021-06-17 (×26): 100 mg
  Filled 2021-06-04 (×27): qty 10

## 2021-06-04 MED ORDER — ALUM & MAG HYDROXIDE-SIMETH 200-200-20 MG/5ML PO SUSP: 30.0000 mL | ORAL | Status: DC | PRN

## 2021-06-04 MED ORDER — OSMOLITE 1.5 CAL PO LIQD
1000.0000 mL | ORAL | Status: DC
  Administered 2021-06-04 – 2021-06-07 (×2): 1000 mL

## 2021-06-04 NOTE — Discharge Instructions (Signed)
Follow up with Dr Neva Seat 7 to 10 days after his discharge form CIR. Continue antibiotic therapy with Augmentin for 7 more days, to stop on 06/10/21.   Continue with aspiration precautions Patient has a IVC filter that needs to be removed once his bleeding risk is low enough to start patient on full anticoagulation for his DVT. If patient continue to have swallow dysfunction will need a PEG tube.

## 2021-06-04 NOTE — Procedures (Signed)
Cortrak  Person Inserting Tube:  Lui Bellis C, RD Tube Type:  Cortrak - 43 inches Tube Size:  10 Tube Location:  Left nare Initial Placement:  Stomach Secured by: Bridle Technique Used to Measure Tube Placement:  Marking at nare/corner of mouth Cortrak Secured At:  70 cm  Cortrak Tube Team Note:  Consult received to place a Cortrak feeding tube.   X-ray is required, abdominal x-ray has been ordered by the Cortrak team. Please confirm tube placement before using the Cortrak tube.   If the tube becomes dislodged please keep the tube and contact the Cortrak team at [email or web address] ([email or web address]) for replacement.  If after hours and replacement cannot be delayed, place a NG tube and confirm placement with an abdominal x-ray.    Holten Spano P., RD, LDN, CNSC See AMiON for contact information     

## 2021-06-04 NOTE — Progress Notes (Signed)
Inpatient Rehabilitation Medication Review by a Pharmacist  A complete drug regimen review was completed for this patient to identify any potential clinically significant medication issues.  Clinically significant medication issues were identified:  no  Check AMION for pharmacist assigned to patient if future medication questions/issues arise during this admission.  Pharmacist comments:   Time spent performing this drug regimen review (minutes):  15   Abad Manard 06/04/2021 5:24 PM

## 2021-06-04 NOTE — H&P (Signed)
Physical Medicine and Rehabilitation Admission H&P    CC: Functional deficits due to IPH with extension.    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  He was started on Cleviprex for BP control and Dr. Roda Shutters question CAA as cause of bleed.  He continued to be limited by left-sided weakness with right gaze preference affecting ADLs and mobility.  He was admitted to [facility] on 05/23/2021 for intensive inpatient rehab program.  He was found to have ileus but was asymptomatic and monitored.  Labs showed mild hyponatremia which was stable.  On a.m. on 06/12, he was noted to have decreasing level of consciousness with neurological changes and was transferred to acute for management.  CT of head done revealing interval progression of right frontal hematoma with worsening of mass-effect and midline shift measuring 13 mm.  He was started on hypertonic saline and I am adamant administered due to concerns of seizure activity.    CT brain repeated showing truncated arterial cortical branch at upper and lateral hematoma without malformation or spot sign with recommendations to have follow-up study after resolution of mass-effect.  MRV repeated and was negative for thrombosis.  BLE Dopplers done 06/15 revealing acute DVT in left CFV and IVC filter placed by radiology.  He was placed on Keppra 1000 mg twice daily   EEG was negative for seizure activity. He was treated with 5 day course of rocephin due to concerns of  LLL PNA and is NPO with tube feeds for nutritional support.  Mentation has been improving and amantadine added to help with activation. On 06/20, he was noted to have copious oral secretions and on  developed fevers -T 100.5 with rise in WBC  t 20.7. CXR done showing new medial basilar/retrocardiac airspace opacity concerning for PNA and he was started on Unasyn for treatment. Cortak displaced today and for MBS. Mentation improved with d/c of keppra but has had recurrent lethargy today.   He continues to be limited by left sided weakness with right gaze preference and inability to cross midline, cognitive deficits with delay in processing and motor planning deficits, left hemiplegia with emerging tone affecting mobility and ADLs CIR recommended due to functional decline. As per wife at bedside, patient's Cortrak was clogged since yesterday.    Review of Systems  Unable to perform ROS: Mental acuity    Past Medical History:  Diagnosis Date   Arthritis     Past Surgical History:  Procedure Laterality Date   APPENDECTOMY     IR ANGIO INTRA EXTRACRAN SEL COM CAROTID INNOMINATE BILAT MOD SED  05/28/2021   IR ANGIO VERTEBRAL SEL VERTEBRAL BILAT MOD SED  05/28/2021   IR IVC FILTER PLMT / S&I /IMG GUID/MOD SED  05/27/2021   IR US GUIDE VASC ACCESS RIGHT  05/27/2021    Family History  Problem Relation Age of Onset   Hypertension Mother    Colon polyps Neg Hx    Colon cancer Neg Hx    Esophageal cancer Neg Hx    Rectal cancer Neg Hx    Stomach cancer Neg Hx     Social History: Married. From [place]--has been in [place] since 1986. 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 Wife reports that he has never smoked. He has never used smokeless tobacco. He reports that he does not drink alcohol--occasional glass of wine. He does not use drugs.    Allergies  Allergen Reactions   Sulfa Antibiotics Other (See Comments)    Nausea and eye swelling     Medications Prior to Admission  Medication Sig Dispense Refill   acetaminophen (TYLENOL) 160 MG/5ML solution Place 20.3 mLs (650 mg total) into feeding tube every 4 (four) hours as needed for mild pain or fever. 120 mL 0    amantadine (SYMMETREL) 50 MG/5ML solution Take 10 mLs (100 mg total) by mouth 2 (two) times daily. 600 mL 0   amoxicillin-clavulanate (AUGMENTIN) 250-62.5 MG/5ML suspension Take 5 mLs (250 mg total) by mouth 2 (two) times daily for 7 days. 100 mL 0   insulin aspart (NOVOLOG) 100 UNIT/ML injection Inject 0-9 Units into the skin every 4 (four) hours. For glucose 121 to 150 use 1 unit, for 151-200 use 2 units, for 201-250 use 3 units, for 251 to 300 use 5 units, for 301 to 350 use 7 units, for 351 or greater use 9 units. 10 mL 11   insulin glargine (LANTUS) 100 UNIT/ML injection Inject 0.05 mLs (5 Units total) into the skin daily. 1.5 mL 0   Nutritional Supplements (FEEDING SUPPLEMENT, OSMOLITE 1.5 CAL,) LIQD Place 1,000 mLs into feeding tube continuous. 1000 mL 0   Nutritional Supplements (FEEDING SUPPLEMENT, PROSOURCE TF,) liquid Place 45 mLs into feeding tube daily. 1350 mL 0   Water For Irrigation, Sterile (FREE WATER) SOLN Place 250 mLs into feeding tube every 6 (six) hours. 1000 mL 0    Drug Regimen Review  Drug regimen was reviewed and remains appropriate with no significant issues identified  Home: Home Living Family/patient expects to be discharged to:: Private residence Living Arrangements: Spouse/significant other   Functional History:    Functional Status:  Mobility:          ADL:    Cognition: Cognition Orientation Level: Oriented to person     Blood pressure 133/81, pulse (!) 103, temperature 98.5 F (36.9 C), temperature source Oral, resp. rate 17, height 5\' 5"  (1.651 m), weight 65 kg, SpO2 100 %. Physical Exam Vitals reviewed. Constitutional:      Comments: Up in bed with neck flexed and gurgling sounds due to oral secretions. Attempted to suction secretions from oral cavity but he tended to clench down on tubing.   Cortak in place.   HENT:    Head:    Comments: Lips with peeling skin. Visible part of tongue noted to have white coating.  Eyes:    Comments:  Eyes injected bilaterally  Cardiovascular:    Rate and Rhythm: Tachycardia present. Pulmonary:    Breath sounds: Rhonchi and rales (LUL/RUL) present. Abdomen: NT, ND Neurological:    Comments: Lethargic and unable to keep eyes open. He was briefly arousable and able to state his name--echolalia noted.  Unable to raise his head and kept head flexed to the right. Dense left hemiplegia. Did grip briefly with RUE.      Results for orders placed or performed during the hospital encounter of 05/23/21 (from the past 48 hour(s))  Glucose, capillary     Status: Abnormal   Collection Time: 06/02/21  7:44 PM  Result Value Ref Range   Glucose-Capillary 147 (H) 70 - 99 mg/dL    Comment: Glucose reference range applies only to samples taken after fasting for at  least 8 hours.  Glucose, capillary     Status: Abnormal   Collection Time: 06/02/21  8:45 PM  Result Value Ref Range   Glucose-Capillary 144 (H) 70 - 99 mg/dL    Comment: Glucose reference range applies only to samples taken after fasting for at least 8 hours.   Comment 1 Notify RN    Comment 2 Document in Chart   Glucose, capillary     Status: Abnormal   Collection Time: 06/02/21 11:46 PM  Result Value Ref Range   Glucose-Capillary 165 (H) 70 - 99 mg/dL    Comment: Glucose reference range applies only to samples taken after fasting for at least 8 hours.  Glucose, capillary     Status: Abnormal   Collection Time: 06/02/21 11:49 PM  Result Value Ref Range   Glucose-Capillary 178 (H) 70 - 99 mg/dL    Comment: Glucose reference range applies only to samples taken after fasting for at least 8 hours.   Comment 1 Notify RN    Comment 2 Document in Chart   CBC with Differential/Platelet     Status: Abnormal   Collection Time: 06/03/21  2:39 AM  Result Value Ref Range   WBC 13.8 (H) 4.0 - 10.5 K/uL   RBC 3.96 (L) 4.22 - 5.81 MIL/uL   Hemoglobin 12.0 (L) 13.0 - 17.0 g/dL   HCT 40.936.5 (L) 81.139.0 - 91.452.0 %   MCV 92.2 80.0 - 100.0 fL   MCH 30.3  26.0 - 34.0 pg   MCHC 32.9 30.0 - 36.0 g/dL   RDW 78.213.9 95.611.5 - 21.315.5 %   Platelets 221 150 - 400 K/uL    Comment: REPEATED TO VERIFY   nRBC 0.0 0.0 - 0.2 %   Neutrophils Relative % 80 %   Neutro Abs 11.1 (H) 1.7 - 7.7 K/uL   Lymphocytes Relative 13 %   Lymphs Abs 1.8 0.7 - 4.0 K/uL   Monocytes Relative 5 %   Monocytes Absolute 0.7 0.1 - 1.0 K/uL   Eosinophils Relative 1 %   Eosinophils Absolute 0.1 0.0 - 0.5 K/uL   Basophils Relative 0 %   Basophils Absolute 0.0 0.0 - 0.1 K/uL   Immature Granulocytes 1 %   Abs Immature Granulocytes 0.09 (H) 0.00 - 0.07 K/uL    Comment: Performed at [facility], [address] [place], [place] [address]  Basic metabolic panel     Status: Abnormal   Collection Time: 06/03/21  2:39 AM  Result Value Ref Range   Sodium 140 135 - 145 mmol/L   Potassium 4.0 3.5 - 5.1 mmol/L   Chloride 108 98 - 111 mmol/L   CO2 26 22 - 32 mmol/L   Glucose, Bld 200 (H) 70 - 99 mg/dL    Comment: Glucose reference range applies only to samples taken after fasting for at least 8 hours.   BUN 19 8 - 23 mg/dL   Creatinine, Ser 8.460.83 0.61 - 1.24 mg/dL   Calcium 8.6 (L) 8.9 - 10.3 mg/dL   GFR, Estimated >96>60 >29>60 mL/min    Comment: (NOTE) Calculated using the CKD-EPI Creatinine Equation (2021)    Anion gap 6 5 - 15    Comment: Performed at [facility], [address] [place], [place] [address]  Glucose, capillary     Status: Abnormal   Collection Time: 06/03/21  4:20 AM  Result Value Ref Range   Glucose-Capillary 189 (H) 70 - 99 mg/dL    Comment: Glucose reference range  applies only to samples taken after fasting for at least 8 hours.   Comment 1 Notify RN    Comment 2 Document in Chart   Glucose, capillary     Status: Abnormal   Collection Time: 06/03/21  7:46 AM  Result Value Ref Range   Glucose-Capillary 163 (H) 70 - 99 mg/dL    Comment: Glucose reference range applies only to samples taken after fasting for at least 8 hours.  Glucose, capillary      Status: Abnormal   Collection Time: 06/03/21 11:24 AM  Result Value Ref Range   Glucose-Capillary 143 (H) 70 - 99 mg/dL    Comment: Glucose reference range applies only to samples taken after fasting for at least 8 hours.  Glucose, capillary     Status: Abnormal   Collection Time: 06/03/21  4:08 PM  Result Value Ref Range   Glucose-Capillary 160 (H) 70 - 99 mg/dL    Comment: Glucose reference range applies only to samples taken after fasting for at least 8 hours.  Glucose, capillary     Status: None   Collection Time: 06/03/21  7:45 PM  Result Value Ref Range   Glucose-Capillary 85 70 - 99 mg/dL    Comment: Glucose reference range applies only to samples taken after fasting for at least 8 hours.  Glucose, capillary     Status: Abnormal   Collection Time: 06/03/21 11:55 PM  Result Value Ref Range   Glucose-Capillary 102 (H) 70 - 99 mg/dL    Comment: Glucose reference range applies only to samples taken after fasting for at least 8 hours.  Glucose, capillary     Status: Abnormal   Collection Time: 06/04/21  4:42 AM  Result Value Ref Range   Glucose-Capillary 115 (H) 70 - 99 mg/dL    Comment: Glucose reference range applies only to samples taken after fasting for at least 8 hours.  Glucose, capillary     Status: Abnormal   Collection Time: 06/04/21  8:06 AM  Result Value Ref Range   Glucose-Capillary 130 (H) 70 - 99 mg/dL    Comment: Glucose reference range applies only to samples taken after fasting for at least 8 hours.  CBC with Differential/Platelet     Status: Abnormal   Collection Time: 06/04/21  8:37 AM  Result Value Ref Range   WBC 11.2 (H) 4.0 - 10.5 K/uL   RBC 4.15 (L) 4.22 - 5.81 MIL/uL   Hemoglobin 12.5 (L) 13.0 - 17.0 g/dL   HCT 84.6 (L) 96.2 - 95.2 %   MCV 91.6 80.0 - 100.0 fL   MCH 30.1 26.0 - 34.0 pg   MCHC 32.9 30.0 - 36.0 g/dL   RDW 84.1 32.4 - 40.1 %   Platelets 230 150 - 400 K/uL    Comment: REPEATED TO VERIFY   nRBC 0.0 0.0 - 0.2 %   Neutrophils Relative  % 81 %   Neutro Abs 9.1 (H) 1.7 - 7.7 K/uL   Lymphocytes Relative 12 %   Lymphs Abs 1.3 0.7 - 4.0 K/uL   Monocytes Relative 5 %   Monocytes Absolute 0.6 0.1 - 1.0 K/uL   Eosinophils Relative 1 %   Eosinophils Absolute 0.1 0.0 - 0.5 K/uL   Basophils Relative 0 %   Basophils Absolute 0.0 0.0 - 0.1 K/uL   Immature Granulocytes 1 %   Abs Immature Granulocytes 0.09 (H) 0.00 - 0.07 K/uL    Comment: Performed at [facility], [address], [place], [place]  [address]  Basic metabolic panel     Status: Abnormal   Collection Time: 06/04/21  8:37 AM  Result Value Ref Range   Sodium 136 135 - 145 mmol/L   Potassium 4.4 3.5 - 5.1 mmol/L   Chloride 106 98 - 111 mmol/L   CO2 23 22 - 32 mmol/L   Glucose, Bld 115 (H) 70 - 99 mg/dL    Comment: Glucose reference range applies only to samples taken after fasting for at least 8 hours.   BUN 17 8 - 23 mg/dL   Creatinine, Ser 9.98 0.61 - 1.24 mg/dL   Calcium 8.5 (L) 8.9 - 10.3 mg/dL   GFR, Estimated >33 >82 mL/min    Comment: (NOTE) Calculated using the CKD-EPI Creatinine Equation (2021)    Anion gap 7 5 - 15    Comment: Performed at [facility], [address], [place], [place] [address]  Glucose, capillary     Status: Abnormal   Collection Time: 06/04/21 11:38 AM  Result Value Ref Range   Glucose-Capillary 107 (H) 70 - 99 mg/dL    Comment: Glucose reference range applies only to samples taken after fasting for at least 8 hours.   DG Abd Portable 1V  Result Date: 06/04/2021 CLINICAL DATA:  Feeding tube placement EXAM: PORTABLE ABDOMEN - 1 VIEW COMPARISON:  Portable exam 1200 hours compared to 05/24/2021 FINDINGS: Tip of feeding tube projects over gastric antrum. IVC filter noted. Bowel gas pattern normal. Lung bases clear. IMPRESSION: Tip of feeding tube projects over gastric antrum. Electronically Signed   By: Ulyses Southward M.D.   On: 06/04/2021 14:05       Medical Problem List and Plan: 1.  Intraparenchymal hematoma of brain  with dense left sided hemiplegia, aphasia, and dysphagia.   -patient may shower  -ELOS/Goals: MinA 3 weeks 2.  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  -DVT/anticoagulation:  Mechanical:  Antiembolism stockings, knee (TED hose) Bilateral lower extremities  -antiplatelet therapy: N/A due to concerns of CAA 3. Pain Management: tylenol prn 4. Mood:  LCSW to follow for evaluation and support.   -antipsychotic agents: N/A 5. Neuropsych: This patient is not fully capable of making decisions on his own behalf. 6. Skin/Wound Care: Routine pressure relief measures.  7. Fluids/Electrolytes/Nutrition: Monitor I/O--check lytes in am. 8. Recurrent aspiration event: On Unasyn Day #3-->transition to oral in 5 days?  --monitor temp curve as well as WBC as continues to have low grade fevers  --Recheck CXR in am.  9. Leucocytosis: Resolving with resumption of atbx and down from 20.7-->11.2.  --Recheck CBC in am. 10. Blood pressure: Monitor BID--controlled without meds at this time. 11. Progressive cognitive decline: To reschedule dementia work up at [facility] after d/c. 12. Hyperglycemia: Hgb A1C- 5.8 and BS mildly elevated due to tube feeds. Continue lantus 5 units daily for now.   --Monitor BS every  4 hours and use SSI for elevated  BS 13. Dysphagia: Continue NPO status with TF for nutritional support.  --oral care every 4 hours while awake.  14. Tachycardia: continue to monitor HR TID  I have personally performed a face to face diagnostic evaluation, including, but not limited to relevant history and physical exam findings, of this patient and developed relevant assessment and plan.  Additionally, I have reviewed and concur with the physician assistant's documentation above.  Sula Soda, MD  Jacquelynn Cree, PA-C

## 2021-06-04 NOTE — Progress Notes (Addendum)
Inpatient Rehab Admissions Coordinator:   I have a bed available for Donald [name] to transfer to CIR today.  Awaiting final confirmation from Dr. Linna Darner that he is stable.  Will update pt/family and TOC.   1155: Dr. Linna Darner in agreement for d/c to CIR. Will let pt/family know.   Estill Dooms, PT, DPT Admissions Coordinator [phone] 06/04/21  11:08 AM

## 2021-06-04 NOTE — Progress Notes (Signed)
PMR Admission Coordinator Pre-Admission Assessment   Patient: Donald [name] is an 65 y.o., male MRN: [id number] DOB: 04-15-1956 Height:   Weight: 69.7 kg   Insurance Information HMO:     PPO: yes     PCP:      IPA:      80/20:      OTHER: PRIMARY: [employer] GEHA      Policy#: [id number] geha      Subscriber: pt CM Name: Donald [name]      Phone#: [phone]     Fax#: [phone] Pre-Cert#: [id number] [name] for CIR given by Donald [name] with [name] with H&P and initial evals due within 3 days of admission (6/27) to fax listed above      Employer: Benefits:  Phone #: [phone]     Name: Eff. Date: 07/11/15     Deduct: $350 ($170.16 met)      Out of Pocket Max: $6500 ($185.16 met)      Life Max: n/a CIR: $700/day for days 1-21      SNF: 85% Outpatient: 85%     Co-Ins: 15% Home Health: 85%      Co-Ins: 15% DME: 85%     Co-Ins: 15% Providers: preferred network  SECONDARY:       Policy#:      Phone#:   Development worker, community:       Phone#:   The Engineer, petroleum" for patients in Inpatient Rehabilitation Facilities with attached "Privacy Act [place] Records" was provided and verbally reviewed with: N/A   Emergency Contact Information Contact Information       Name Relation Home Work Mobile    Donald [name] Spouse [phone]   [phone]    Donald [name]     [phone]           Current Medical History  Patient Admitting Diagnosis: R posterior frontal IPH with evolution   History of Present Illness: 65 year old male with recent admission to [name] for intracranial hemorrhage.   Originally admitted to the hospital May 31 with intracranial hemorrhage of the posterior right frontal lobe.  He was transferred to inpatient rehab for further care on 6/9.  While at the rehab he had acute change in his mentation with difficulty swallowing and facial droop.  Stat head CT showed right intraparenchymal hemorrhage with worsening midline shift.  He was  transported to neuro ICU on 6/12 at [name] for further evaluation.  His blood pressure was 135/77, temperature 97.5, respiratory rate 18, oxygen saturation 100%, his lungs were clear to auscultation bilaterally, heart S1-S2, present, rhythmic, soft abdomen, no lower extremity edema.  Patient was responsive to noxious stimuli but not verbal or touch.  Not following commands.  Patient admitted to the neuro ICU, for further close neurologic monitoring.  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  Patient was placed on Keppra for seizure prophylaxis.  Medical treatment with hypertonic saline for intracranial hypertension.  Patient underwent cerebral angiogram with poor to no significant opacification of RT transverse sinus in this entirety.  Suspicion for thrombotic occlusion.  Grossly patent RT MCA and RT PCA branches with mass-effect on the RT ACA A2-A3 branches and RT MCA distal M3-M4 branches secondary to hematoma/edema. no AV shunting or AVM or aneurysm identified.  Therapy evaluations were completed and pt was recommended to return to CIR for intense rehabilitation.    Complete NIHSS TOTAL: 23   Patient's medical record from [name] has been reviewed by the rehabilitation admission  coordinator and physician.   Past Medical History      Past Medical History:  Diagnosis Date   Arthritis        Family History   family history includes Hypertension in his mother.   Prior Rehab/Hospitalizations Has the patient had prior rehab or hospitalizations prior to admission? Yes   Has the patient had major surgery during 100 days prior to admission? Yes              Current Medications   Current Facility-Administered Medications:   acetaminophen (TYLENOL) 160 MG/5ML solution 650 mg, 650 mg, Per Tube, Q4H PRN, Donald Brood, [name], 650 mg at 06/02/21 0007   acetaminophen (TYLENOL) suppository 650 mg, 650 mg, Rectal, Q4H PRN, [name],  Donald D, [name], 650 mg at 05/23/21 1552   amantadine (SYMMETREL) 50 MG/5ML solution 100 mg, 100 mg, Oral, BID, Donald [name], Donald [name], 100 mg at 06/03/21 1147   Ampicillin-Sulbactam (UNASYN) 3 g in sodium chloride 0.9 % 100 mL IVPB, 3 g, Intravenous, Q6H, Donald [name], Last Rate: 200 mL/hr at 06/04/21 0530, 3 g at 06/04/21 0530   chlorhexidine (PERIDEX) 0.12 % solution 15 mL, 15 mL, Mouth Rinse, BID, Donald [name], 15 mL at 06/03/21 2111   Chlorhexidine Gluconate Cloth 2 % PADS 6 each, 6 each, Topical, Daily, Donald [name], 6 each at 06/02/21 0819   feeding supplement (OSMOLITE 1.5 CAL) liquid 1,000 mL, 1,000 mL, Per Tube, Continuous, Donald [name], Last Rate: 50 mL/hr at 06/03/21 0555, 1,000 mL at 06/03/21 0555   feeding supplement (PROSource TF) liquid 45 mL, 45 mL, Per Tube, Daily, Donald [name], 45 mL at 06/03/21 1147   free water 250 mL, 250 mL, Per Tube, Q4H, Donald [name], 250 mL at 06/03/21 1146   insulin aspart (novoLOG) injection 0-9 Units, 0-9 Units, Subcutaneous, Q4H, Donald [name], 1 Units at 06/04/21 0900   insulin glargine (LANTUS) injection 5 Units, 5 Units, Subcutaneous, Daily, Donald [name], 5 Units at 06/03/21 1146   MEDLINE mouth rinse, 15 mL, Mouth Rinse, q12n4p, Donald [name], 15 mL at 06/03/21 1708   metoprolol tartrate (LOPRESSOR) injection 5 mg, 5 mg, Intravenous, Q6H PRN, Donald [name]   Patients Current Diet:  Diet Order                  Diet NPO time specified  Diet effective now                         Precautions / Restrictions Precautions Precautions: Fall Precaution Comments: L hemiparesis, L inattention, pusher to the L, cognitive deficits, cortrak Restrictions Weight Bearing Restrictions: No    Has the patient had 2 or more falls or a fall with injury in the past year? 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 Independent   Indoor Mobility: Did the patient need assistance with walking from room to room (with or without device)? Independent   Stairs: Did the patient need assistance with internal or external stairs (with or without device)? Independent   Functional Cognition: Did the patient need help planning regular tasks such as shopping  or remembering to take medications? Needed some help   Home Assistive Devices / Equipment Home Equipment: Shower seat - built in, Grab bars - toilet, Grab bars - tub/shower   Prior Device Use: Indicate devices/aids used by the patient prior to current illness, exacerbation or injury?  cane   Current Functional Level Cognition   Arousal/Alertness:  (drowsy, awake) Overall Cognitive Status: Impaired/Different from baseline Difficult to assess due to: Level of arousal Current Attention Level: Focused Orientation Level: Oriented to person Following Commands: Follows one step commands inconsistently, Follows one step commands with increased time Safety/Judgement: Decreased awareness of safety, Decreased awareness of deficits General Comments: Improved verbalizations this session. Following commands with increased time. Attention: Sustained Sustained Attention: Impaired Sustained Attention Impairment: Verbal basic, Functional basic Memory: Impaired Awareness: Impaired Awareness Impairment: Intellectual impairment, Emergent impairment, Anticipatory impairment Problem Solving: Impaired Problem Solving Impairment: Functional basic Executive Function: Initiating, Reasoning, Sequencing, Organizing, Self Correcting, Self Monitoring Safety/Judgment: Impaired    Extremity Assessment (includes  Sensation/Coordination)   Upper Extremity Assessment: RUE deficits/detail, LUE deficits/detail, Overall WFL for tasks assessed (difficult to assess d/t lethargy) RUE Deficits / Details: AAROM, WFLs strength 4/5 LUE Deficits / Details: No grasp strength or hand AROM. No active movement at scapula and noting increased tone. LUE Sensation: decreased light touch, decreased proprioception LUE Coordination: decreased fine motor, decreased gross motor  Lower Extremity Assessment: Defer to PT evaluation RLE Deficits / Details: grossly 3+/5, able to move PROM against gravity, follwed commands with 2-4 seconds increased time. RLE Sensation: WNL LLE Deficits / Details: no active movement LLE Sensation:  (pt accurately stating he felt presence or abscence of PT touch 3/5 times)     ADLs   Overall ADL's : Needs assistance/impaired Eating/Feeding: NPO Eating/Feeding Details (indicate cue type and reason): cortrak Grooming: Total assistance Upper Body Bathing: Total assistance, Bed level Lower Body Bathing: Total assistance, Bed level Upper Body Dressing : Total assistance, Bed level Lower Body Dressing: Total assistance, Bed level Toilet Transfer: Total assistance, +2 for physical assistance, +2 for safety/equipment, Maximal assistance Toilet Transfer Details (indicate cue type and reason): simulated for sit to stand, but unable to bear weight on L side so unsafe to stand as pt too fatigued. Pt with minimal initiation on R side Toileting- Clothing Manipulation and Hygiene: Total assistance Toileting - Clothing Manipulation Details (indicate cue type and reason): unable to tell that he had a BM in bed Functional mobility during ADLs: Maximal assistance, Total assistance, +2 for safety/equipment, Cueing for safety, Cueing for sequencing General ADL Comments: Pt lethargic throuhghout session, suspect due to meds. Pt sat EOB several minutes at mostly mod A, varing at times min - max A. Pillow under each  forearm for support. Pt with difficulty coming out of neck flexion.     Mobility   Overal bed mobility: Needs Assistance Bed Mobility: Supine to Sit, Sit to Supine Rolling: Max assist Sidelying to sit: Total assist, +2 for physical assistance, +2 for safety/equipment Supine to sit: Total assist, +2 for safety/equipment, +2 for physical assistance Sit to supine: Total assist, +2 for physical assistance, +2 for safety/equipment General bed mobility comments: totalA for all aspects with no initiation of movement     Transfers   Overall transfer level: Needs assistance Equipment used: Ambulation equipment used Transfer via Lift Equipment: Stedy Transfers: Sit to/from Stand Sit to Stand: +2 physical assistance, +2 safety/equipment, Max assist Squat pivot transfers: Max assist, +2 physical assistance General transfer comment: maxA+2 to rise and steady. Sit to  stand x 3     Ambulation / Gait / Stairs / Wheelchair Mobility   Ambulation/Gait General Gait Details: unable     Posture / Balance Dynamic Sitting Balance Sitting balance - Comments: requires maxA to maintain static sitting. Less pushing noted this session Balance Overall balance assessment: Needs assistance Sitting-balance support: Single extremity supported, Feet supported Sitting balance-Leahy Scale: Zero Sitting balance - Comments: requires maxA to maintain static sitting. Less pushing noted this session Postural control: Posterior lean, Left lateral lean Standing balance support: Bilateral upper extremity supported Standing balance-Leahy Scale: Zero Standing balance comment: posterior and lean to L.     Special needs/care consideration Diabetic management yes and Special service needs possible w/c eval    Previous Home Environment (from acute therapy documentation) Living Arrangements: Spouse/significant other  Lives With: Spouse Available Help at Discharge: Family, Available 24 hours/day Type of Home: House Home  Layout: One level Home Access: Stairs to enter Entrance Stairs-Rails: Left Entrance Stairs-Number of Steps: 4 Bathroom Shower/Tub: Multimedia programmer: Standard Bathroom Accessibility: Yes How Accessible: Accessible via walker Additional Comments: has access to RW from pt's [place] for Discharge Living Setting: Patient's home, Lives with (comment) (spouse, [name]) Type of Home at Discharge: House Discharge Home Layout: One level Discharge Home Access: Stairs to enter Entrance Stairs-Rails: Left Entrance Stairs-Number of Steps: 4 Discharge Bathroom Shower/Tub: Walk-in shower Discharge Bathroom Toilet: Standard Discharge [place] Accessibility: Yes How Accessible: Accessible via walker Does the patient have any problems obtaining your medications?: No   Social/Family/Support Systems Patient Roles: Spouse Anticipated Caregiver: Itay Mella Anticipated Caregiver's Contact Information: [phone] Ability/Limitations of Caregiver: mod assist w/c level Caregiver Availability: 24/7 Discharge Plan Discussed with Primary Caregiver: Yes Is Caregiver In Agreement with Plan?: Yes Does Caregiver/Family have Issues with Lodging/Transportation while Pt is in Rehab?: No   Goals Patient/Family Goal for Rehab: PT/OT mod assist, SLP min assist Expected length of stay: 28-32 days Additional Information: outpatient workup for cognitive decline in progress prior to admit Pt/Family Agrees to Admission and willing to participate: Yes Program Orientation Provided & Reviewed with Pt/Caregiver Including Roles  & Responsibilities: Yes Additional Information Needs: pt now pushing, explained to wife likely would be significantly more assist than previously anticipated.  Still must d/c home from [place] verbalized understanding  Barriers to Discharge: Insurance for SNF coverage, Home environment access/layout   Decrease burden of Care through IP  rehab admission: n/a   Possible need for SNF placement upon discharge: Not anticipated.  Pt/family prepared to take patient home after CIR.  Reviewed increased burden of care given extension of IPH and [name] verbalizes understanding.  I reviewed that insurance will not likely approve SNF following CIR.   Patient Condition: I have reviewed medical records from [facility], spoken with CM, and patient and spouse. I met with patient at the bedside for inpatient rehabilitation assessment.  Patient will benefit from ongoing PT, OT, and SLP, can actively participate in 3 hours of therapy a day 5 days of the week, and can make measurable gains during the admission.  Patient will also benefit from the coordinated team approach during an Inpatient Acute Rehabilitation admission.  The patient will receive intensive therapy as well as Rehabilitation physician, nursing, social worker, and care management interventions.  Due to bladder management, bowel management, safety, skin/wound care, disease management, medication administration, pain management, and patient education the patient requires 24 hour a day rehabilitation nursing.  The patient is currently max to  total +2 with mobility and basic ADLs.  Discharge setting and therapy post discharge at home with home health is anticipated.  Patient has agreed to participate in the Acute Inpatient Rehabilitation Program and will admit today.   Preadmission Screen Completed By:  Michel Santee, PT, DPT 06/04/2021 1:13 PM ______________________________________________________________________   Discussed status with Dr. Ranell Patrick on 06/04/21  at 1:13 PM  and received approval for admission today.   Admission Coordinator:  Michel Santee, PT, DPT time 1:13 PM Sudie Grumbling 06/04/21     Assessment/Plan: Diagnosis: Intracerebral hemorrhage Does the need for close, 24 hr/day Medical supervision in concert with the patient's rehab needs make it unreasonable for this patient to be  served in a less intensive setting? Yes Co-Morbidities requiring supervision/potential complications: hypokalemia, moderate malnutrition, overweight BMI 25.57, seizure, right femoral vein DVT, right lower lobe pneumonia Due to bladder management, bowel management, safety, skin/wound care, disease management, medication administration, pain management, and patient education, does the patient require 24 hr/day rehab nursing? Yes Does the patient require coordinated care of a physician, rehab nurse, PT, OT, and SLP to address physical and functional deficits in the context of the above medical diagnosis(es)? Yes Addressing deficits in the following areas: balance, endurance, locomotion, strength, transferring, bowel/bladder control, bathing, dressing, feeding, grooming, toileting, cognition, speech, language, swallowing, and psychosocial support Can the patient actively participate in an intensive therapy program of at least 3 hrs of therapy 5 days a week? Yes The potential for patient to make measurable gains while on inpatient rehab is good Anticipated functional outcomes upon discharge from inpatient rehab: min assist PT, min assist OT, min assist SLP Estimated rehab length of stay to reach the above functional goals is: 2-3 weeks Anticipated discharge destination: Home 10. Overall Rehab/Functional Prognosis: good     [name] Signature: Leeroy Cha, [name]          Revision History

## 2021-06-04 NOTE — Discharge Summary (Signed)
Physician Discharge Summary  [name] ZOX:[id number] DOB: 09/12/1956 DOA: 05/23/2021  PCP: [name], MD  Admit date: 05/23/2021 Discharge date: 06/04/2021  Admitted From: CIR Disposition:  CIR  Recommendations for Outpatient Follow-up:  Follow up with Dr Neva Seat 7 to 10 days after his discharge form CIR. Continue antibiotic therapy with Augmentin for 7 more days, to stop on 06/10/21.   Continue with aspiration precautions Patient has a IVC filter that needs to be removed once his bleeding risk is low enough to start patient on full anticoagulation for his DVT. If patient continue to have swallow dysfunction will need a PEG tube.    I spoke with patient's wife at the bedside, we talked in detail about patient's condition, plan of care and prognosis and all questions were addressed.  Home Health: Not applicable Equipment/Devices: Cortrak feeding tube  Discharge Condition: Stable  CODE STATUS: Full Diet recommendation: Tube feedings.  Brief/Interim Summary: Donald [name] was admitted to the hospital with the working diagnosis of acute worsening intracranial hemorrhage of the right frontal lobe complicated by left femoral DVT s/p IVC filter.  Complicated with encephalopathy, aspiration pneumonia and severe sepsis.    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  Originally admitted to the hospital May 31 with intracranial hemorrhage of the posterior right frontal lobe.  He was transferred to inpatient rehab for further care.  While at the rehab he had acute change in his mentation with difficulty swallowing and facial droop.  Stat head CT showed right intraparenchymal hemorrhage with worsening midline shift.  He was transported to neuro ICU for further evaluation.  His blood pressure was 135/77, temperature 97.5, respiratory rate 18, oxygen  saturation 100%, his lungs were clear to auscultation bilaterally, heart S1-S2, present, rhythmic, soft abdomen, no lower extremity edema.  Patient was responsive to noxious stimuli but not verbal or touch.  Not following commands.   Patient admitted to the neuro ICU, for further close neurologic monitoring.  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  Patient was placed on Keppra for seizure prophylaxis.  Medical treatment with hypertonic saline for intracranial hypertension.   Patient underwent cerebral angiogram with poor to no significant opacification of RT transverse sinus in this entirety.  Suspicion for thrombotic occlusion.  Grossly patent RT MCA and RT PCA branches with mass-effect on the RT ACA A2-A3 branches and RT MCA distal M3-M4 branches secondary to hematoma/edema. No AV shunting or AVM or aneurysm identified.   Further work up with MRV with no evidence of clot or venous sinus thrombosis.     He was found to have a right femoral DVT and an IVC filter was placed (06/15).     Plan to remove IVC filter when reduced bleeding risk and anticoagulation can be started. Family aware that after prolonged time filter can be source of further blood clots     He was diagnosed with left lower lobe pneumonia (not present on admission) and received antibiotic therapy with ceftriaxone.   Transferred to progressive care, continue to have severe swallow dysfunction. Has NG tube for nutrition. NG was dislodged overnight on 6/23-6/24 and replaced by Cortrak Tube team today 6/24    06/22 with signs of recurrent aspiration pneumonia, positive fever and worsening leukocytosis. Severe sepsis. Chest film with left lower infiltrate   Placed on Unasyn and discontinued keppra. His sepsis syndrome  and mentation has improved. Unasyn transitioned to Augmentin for discharge.   Patient will be transferred back to CIR to continue rehab.     Intracranial  hemorrhage right frontal lobe, complicated with toxic and metabolic acute encephalopathy. The source of his original bleed still not clear, the recommendation is to repeat CTA head and MRI with and without contrast once hematoma resolves as an outpatient to rule out underlying source which is masked by current intra cerebral hemorrhage.   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   Currently he continues to have left-sided hemiparesis, but able to answer simple questions and follow simple commands with his right side.  He does have persistent swallow dysfunction and has an NG tube in place for nutrition. His Keppra has been discontinued and now he is on amantadine.   Plan to continue aggressive physical therapy at inpatient rehab, continue to assess his swallowing, if persistent high risk of aspiration will need a more permanent enteral port, PEG tube.   2.  Left femoral deep vein thrombosis.  Patient underwent ultrasonography of his lower extremities finding acute deep vein thrombosis involving the left common femoral vein. Because of recent intracranial bleed, not candidate for full anticoagulation.  A IVC filter was placed May 28, 2019.    So far his family have declined prophylactic enoxaparin. I spoke with Dr Pearlean Brownie and patient is safe to use enoxaparin prophylactic dose. Advice his family to talk to Dr Pearlean Brownie as well about this issue in case they have more questions. I spoke with Dr. Pearlean Brownie today 6/24 about this and he will call the wife to further discuss but patient is okay to dc to CIR. Currently will continue to hold on enoxaparin prophylactic dose per his family request.    They have been made aware about risk of  developing Recurrent thromboembolism even in the presence of IVC filter.   3.  Left lower lobe aspiration pneumonia, recurrent, complicated with severe sepsis (endorgan damage encephalopathy), not present on admission.   Patient with significant swallow  dysfunction, and high risk of aspiration. He developed a more severe aspiration pneumonia on his left lower lobe.   He has been placed on antibiotic therapy with Unasyn that will be transition to Augmentin at discharge. His white cell count has been improving and his severe sepsis syndrome resolved.   4.  Prediabetes, hemoglobin A1c 5.8 complicated with hyperglycemia, moderate calorie protein malnutrition. Patient received insulin sliding scale along with basal for glucose control. He has been tolerating well tube feeds, continue to be n.p.o.   5.  Hypokalemia, hypernatremia, hyperchloremia, contraction metabolic alkalosis. Patient received supportive medical therapy including intravenous fluids during his hospitalization. Currently he is getting free water flushes and tube feeds.   Sodium has been improving, today 140, potassium 4.0, chloride 108, bicarb 26, glucose 200, BUN 19, creatinine 0.83.   6.  Dementia.  It is documented patient to have cognitive impairment, patient evaluated by [facility] neurology as an outpatient.   7. HTN. Blood pressure is 121/75 mmHg. Continue to hold on antihypertensive medications. Discharge Diagnoses:  Principal Problem:   Intracerebral hemorrhage Active Problems:   Hypokalemia   Malnutrition of moderate degree   Seizure (HCC)   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HCC)   Right lower lobe pneumonia   Prediabetes   Hyperglycemia    Discharge Instructions  Discharge Instructions     Change dressing (specify)   Complete by: As directed    Dressing change: 1 times per day using  4x4's.   Increase activity slowly   Complete by: As directed       Allergies as of 06/04/2021       Reactions   Sulfa Antibiotics Other (See Comments)   Nausea and eye swelling        Medication List     STOP taking these medications    amLODipine 10 MG tablet Commonly known as: NORVASC   atorvastatin 20 MG tablet Commonly known as: Lipitor   hydrochlorothiazide 12.5  MG capsule Commonly known as: MICROZIDE   MENS 50+ MULTI VITAMIN/MIN PO   pantoprazole 40 MG tablet Commonly known as: PROTONIX   polyethylene glycol 17 g packet Commonly known as: MIRALAX / GLYCOLAX   senna-docusate 8.6-50 MG tablet Commonly known as: Senokot-S       TAKE these medications    acetaminophen 160 MG/5ML solution Commonly known as: TYLENOL Place 20.3 mLs (650 mg total) into feeding tube every 4 (four) hours as needed for mild pain or fever.   amantadine 50 MG/5ML solution Commonly known as: SYMMETREL Take 10 mLs (100 mg total) by mouth 2 (two) times daily.   amoxicillin-clavulanate 250-62.5 MG/5ML suspension Commonly known as: AUGMENTIN Take 5 mLs (250 mg total) by mouth 2 (two) times daily for 7 days.   feeding supplement (OSMOLITE 1.5 CAL) Liqd Place 1,000 mLs into feeding tube continuous.   feeding supplement (PROSource TF) liquid Place 45 mLs into feeding tube daily.   free water Soln Place 250 mLs into feeding tube every 6 (six) hours.   insulin aspart 100 UNIT/ML injection Commonly known as: novoLOG Inject 0-9 Units into the skin every 4 (four) hours. For glucose 121 to 150 use 1 unit, for 151-200 use 2 units, for 201-250 use 3 units, for 251 to 300 use 5 units, for 301 to 350 use 7 units, for 351 or greater use 9 units.   insulin glargine 100 UNIT/ML injection Commonly known as: LANTUS Inject 0.05 mLs (5 Units total) into the skin daily.               Discharge Care Instructions  (From admission, onward)           Start     Ordered   06/04/21 0000  Change dressing (specify)       Comments: Dressing change: 1 times per day using 4x4's.   06/04/21 1208            Allergies  Allergen Reactions   Sulfa Antibiotics Other (See Comments)    Nausea and eye swelling     Consultations: Neurology, IR   Procedures/Studies: DG Abd 1 View  Result Date: 05/20/2021 CLINICAL DATA:  Abdominal pain. EXAM: ABDOMEN - 1 VIEW  COMPARISON:  None. FINDINGS: Large amount of intestinal gas throughout small and large bowel suggesting ileus. There does not appear to be a large amount of fecal matter. No focal obstruction is evident. No free air. No abnormal calcifications or significant bone findings. IMPRESSION: Large amount of intestinal gas suggesting ileus. Electronically Signed   By: Paulina Fusi M.D.   On: 05/20/2021 19:25   CT HEAD WO CONTRAST  Result Date: 05/27/2021 CLINICAL DATA:  65 year old male with right hemisphere hemorrhage at code stroke presentation 05/11/2021. Subsequent encounter. EXAM: CT HEAD WITHOUT CONTRAST TECHNIQUE: Contiguous axial images were obtained from the base of the skull through the vertex without intravenous contrast. COMPARISON:  Head CT 05/23/2021 and earlier. FINDINGS: Brain: Evolving right superior frontal lobe hemorrhage. Slowly fading hyperdense and mixed density  blood products involving the superior and middle frontal gyri in an area of up to 48 x 35 x 50 mm (AP by transverse by CC). Continued regional edema and mass effect including effaced right lateral ventricle and leftward midline shift up to 7 mm at the midportion of the septum pellucidum. No ventriculomegaly. No intraventricular or significant extra-axial hemorrhage. Basilar cisterns remain patent. Stable gray-white matter differentiation elsewhere. No acute cortically based infarct. Vascular: Mild Calcified atherosclerosis at the skull base. Skull: Stable, negative. Sinuses/Orbits: Mild to moderate maxillary alveolar recess mucosal thickening. Otherwise paranasal sinuses and mastoids are stable and well aerated. Other: Right nasoenteric tube in place. Visualized orbits and scalp soft tissues are within normal limits. IMPRESSION: 1. Slowly evolving right superior frontal lobe hemorrhage, not significantly changed in size or configuration since 05/23/2021, with stable regional edema and mass effect. 2. No new intracranial abnormality.  Electronically Signed   By: Odessa Fleming M.D.   On: 05/27/2021 06:24   CT HEAD WO CONTRAST  Result Date: 05/23/2021 CLINICAL DATA:  Follow-up intracerebral hemorrhage EXAM: CT HEAD WITHOUT CONTRAST TECHNIQUE: Contiguous axial images were obtained from the base of the skull through the vertex without intravenous contrast. COMPARISON:  CT 05/23/2021 FINDINGS: Brain: No significant interval change in the overall size of the intraparenchymal hemorrhagic component seen in the right frontal with some extensive surrounding hypo attenuating edematous changes. Locoregional mass effect with sulcal effacement and effacement of the right lateral ventricle as well as stable subfalcine herniation with a right to left midline shift of approximately 14 mm. Slight medialization of the right uncus without clear transtentorial herniation is also unchanged from prior. Basal cisterns remain patent. No new sites of hemorrhage. No new loss of gray-white differentiation. Vascular: No hyperdense vessel or unexpected calcification. Skull: Normal. Negative for fracture or focal lesion. EEG leads in place. Sinuses/Orbits: Mild mural thickening in the maxillary sinuses. Paranasal sinuses and mastoid air cells are otherwise predominantly clear. Included orbital structures are unremarkable. Other: None. IMPRESSION: 1. Stable appearance of the right frontal lobe intraparenchymal hemorrhage with surrounding cerebral edema, local mass effect as well as subfalcine midline shift of approximately 14 mm in slight medial ization of the right uncus, not significantly changed from comparison imaging. 2. No new sites of hemorrhage or new gray-white differentiation loss. Electronically Signed   By: Kreg Shropshire M.D.   On: 05/23/2021 23:58   CT HEAD WO CONTRAST  Result Date: 05/23/2021 CLINICAL DATA:  Follow-up right frontal parenchymal hemorrhage seen earlier today. EXAM: CT HEAD WITHOUT CONTRAST TECHNIQUE: Contiguous axial images were obtained from the  base of the skull through the vertex without intravenous contrast. COMPARISON:  Earlier today. FINDINGS: Brain: No significant change in the previously described high right frontal lobe parenchymal hematoma with extensive surrounding edema and midline shift to the left. The midline shift currently measures 10 mm on image number 16/3. No interval ventricular enlargement. No new hemorrhage. Vascular: No hyperdense vessel or unexpected calcification. Skull: Normal. Negative for fracture or focal lesion. Sinuses/Orbits: Stable small left frontal sinus osteoma. Mild moderate left maxillary sinus mucosal thickening without significant change. Unremarkable orbits. Other: Bilateral concha bullosa. IMPRESSION: 1. Stable large high right frontal parenchymal hematoma with extensive associated edema and midline shift to the left. 2. Stable left maxillary chronic sinusitis. Electronically Signed   By: Beckie Salts M.D.   On: 05/23/2021 18:00   CT HEAD WO CONTRAST  Result Date: 05/23/2021 CLINICAL DATA:  Cerebral hemorrhage, decreased responsiveness EXAM: CT HEAD WITHOUT CONTRAST TECHNIQUE: Contiguous  axial images were obtained from the base of the skull through the vertex without intravenous contrast. COMPARISON:  Earlier same day FINDINGS: Brain: Parenchymal hematoma centered within the high right frontal lobe does not measure substantially changed from the prior study. Extensive surrounding edema is again noted with regional mass effect. Leftward midline shift measures similar at 14 mm at the level of the septum pellucidum. Ventricle caliber is unchanged with no new trapping. No significant central herniation. There is no new loss of gray-white differentiation. Intraventricular hemorrhage is identified. Vascular: No new findings. Skull: Unremarkable. Sinuses/Orbits: No acute finding. Other: None. IMPRESSION: No substantial change in right frontal parenchymal hemorrhage, edema, and mass effect including midline shift. No  new ventricle trapping or acute infarction. Electronically Signed   By: Guadlupe Spanish M.D.   On: 05/23/2021 12:49   CT HEAD WO CONTRAST  Result Date: 05/11/2021 CLINICAL DATA:  Stroke.  Intracranial hemorrhage follow-up. EXAM: CT HEAD WITHOUT CONTRAST TECHNIQUE: Contiguous axial images were obtained from the base of the skull through the vertex without intravenous contrast. COMPARISON:  Same day head CT. FINDINGS: Brain: Mild interval increase in the size of the right frontal intraparenchymal hemorrhage, measuring 4.5 x 2.6 x 3.5 cm (previously 4.2 x 2.3 x 3.1 cm). Similar adjacent small volume subarachnoid hemorrhage. A small amount of subdural hemorrhage in this region is also not excluded. Surrounding edema is similar. Similar local mass effect without midline shift. Basal cisterns are patent. No evidence of acute large vascular territory infarct elsewhere. Similar mild white matter hypodensities, likely chronic microvascular ischemic disease. No hydrocephalus. Vascular: No hyperdense vessel.  Calcific atherosclerosis. Skull: No acute fracture. Sinuses/Orbits: Small left frontal sinus osteoma. Mild right and moderate left maxillary sinus mucosal thickening. Unremarkable orbits. Other: No mastoid effusions. IMPRESSION: Mild interval increase in the size of the right frontal intraparenchymal hemorrhage, measuring 4.5 x 2.6 x 3.5 cm (previously 4.2 x 2.3 x 3.1 cm). Similar adjacent small volume hemorrhage. Electronically Signed   By: Feliberto Harts MD   On: 05/11/2021 17:05   MR BRAIN WO CONTRAST  Result Date: 05/12/2021 CLINICAL DATA:  64 year old male code stroke presentation with intra-axial right hemisphere hemorrhage. Hypertensive (194/104) on presentation. EXAM: MRI HEAD WITHOUT CONTRAST TECHNIQUE: Multiplanar, multiecho pulse sequences of the brain and surrounding structures were obtained without intravenous contrast. COMPARISON:  CT head CTA head and neck 05/11/2021 FINDINGS: Brain: Coronal T2  weighted imaging could not be obtained. And some of the exam is intermittently degraded by motion artifact despite repeated imaging attempts. T1 isointense intra-axial hemorrhage in the posterior right frontal lobe demonstrates a layering hematocrit level (series 13, image 19) and blood encompasses 43 by 38 x 33 mm (AP by transverse by CC) for an estimated volume of 26 mL. Regional edema. Mild regional mass effect. Trace subarachnoid hemorrhage also suspected on axial FLAIR imaging. Additionally, on SWI there is also chronic hemosiderin in the right parietal lobe on series 19 image 34. But no other definite chronic blood products. Susceptibility related abnormal diffusion in the posterior right frontal lobe with no convincing larger area of restricted diffusion. No restricted diffusion elsewhere. No IVH or ventriculomegaly. Normal basilar cisterns. Negative pituitary and cervicomedullary junction. Wallace Cullens and white matter signal outside of the affected right hemisphere is largely normal for age with mild nonspecific white matter changes. Vascular: Major intracranial vascular flow voids are preserved. Skull and upper cervical spine: Visualized bone marrow signal is within normal limits. Grossly negative cervical spine. Sinuses/Orbits: Negative orbits. Mild to moderate maxillary sinus  mucosal thickening. Other: Mastoids are clear. Grossly normal visible internal auditory structures. IMPRESSION: 1. Posterior right frontal lobe intra-axial hemorrhage with layering hematocrit level has not significantly changed (estimated blood volume of 26 mL). Surrounding edema and mild regional mass effect. Trace subarachnoid hemorrhage. 2. No larger underlying infarct is evident. But chronic hemosiderin in the right parietal lobe indicates a previous intra-axial hemorrhage in the region. 3. No other acute intracranial abnormality. Electronically Signed   By: Odessa Fleming M.D.   On: 05/12/2021 07:26   IR IVC FILTER PLMT / S&I Lenise Arena GUID/MOD  SED  Result Date: 05/27/2021 INDICATION: 65 year old gentleman with lower extremity DVT and acute intracranial hemorrhage presents to IR for IVC filter placement EXAM: Retrievable IVC filter placement utilizing fluoroscopic and ultrasound guidance MEDICATIONS: None. ANESTHESIA/SEDATION: One mg IV Versed; 50 mcg IV Fentanyl Moderate Sedation Time:  12 minutes The patient was continuously monitored during the procedure by the interventional radiology nurse under my direct supervision. FLUOROSCOPY TIME:  Fluoroscopy Time: 1 minutes 6 seconds (9 mGy). COMPLICATIONS: None immediate. PROCEDURE: Informed written consent was obtained from the patient after a thorough discussion of the procedural risks, benefits and alternatives. All questions were addressed. Maximal Sterile Barrier Technique was utilized including caps, mask, sterile gowns, sterile gloves, sterile drape, hand hygiene and skin antiseptic. A timeout was performed prior to the initiation of the procedure. Patient positioned supine on the angiography table. Right neck prepped and draped in usual sterile fashion. All elements of maximal sterile barrier were utilized including, cap, mask, sterile gown, sterile gloves, large sterile drape, hand scrubbing and 2% Chlorhexidine for skin cleaning. The right internal jugular vein was evaluated with ultrasound and shown to be patent. A permanent ultrasound image was obtained and placed in the patient's medical record. Using sterile gel and a sterile probe cover, the right internal jugular vein was entered with a 21 ga needle during real time ultrasound guidance. 21 gauge needle exchanged for a transitional dilator set over 0.018 inch guidewire. Transitional dilator set exchanged for 10 fr sheath over 0.035 inch guidewire. Sheath placed to the infrarenal IVC and CO2 venogram performed to delineate the position of the renal veins. Retrievable Denali IVC filter deployed in the infrarenal location. Sheath removed and  hemostasis achieved with manual compression. IMPRESSION: Retrievable Denali IVC filter placed. PLAN: This IVC filter is potentially retrievable. The patient will be assessed for filter retrieval by Interventional Radiology in approximately 8-12 weeks. Further recommendations regarding filter retrieval, continued surveillance or declaration of device permanence, will be made at that time. Electronically Signed   By: Acquanetta Belling M.D.   On: 05/27/2021 16:38   IR US Guide Vasc Access Right  Result Date: 05/27/2021 INDICATION: 65 year old gentleman with lower extremity DVT and acute intracranial hemorrhage presents to IR for IVC filter placement EXAM: Retrievable IVC filter placement utilizing fluoroscopic and ultrasound guidance MEDICATIONS: None. ANESTHESIA/SEDATION: One mg IV Versed; 50 mcg IV Fentanyl Moderate Sedation Time:  12 minutes The patient was continuously monitored during the procedure by the interventional radiology nurse under my direct supervision. FLUOROSCOPY TIME:  Fluoroscopy Time: 1 minutes 6 seconds (9 mGy). COMPLICATIONS: None immediate. PROCEDURE: Informed written consent was obtained from the patient after a thorough discussion of the procedural risks, benefits and alternatives. All questions were addressed. Maximal Sterile Barrier Technique was utilized including caps, mask, sterile gowns, sterile gloves, sterile drape, hand hygiene and skin antiseptic. A timeout was performed prior to the initiation of the procedure. Patient positioned supine on the angiography table. Right  neck prepped and draped in usual sterile fashion. All elements of maximal sterile barrier were utilized including, cap, mask, sterile gown, sterile gloves, large sterile drape, hand scrubbing and 2% Chlorhexidine for skin cleaning. The right internal jugular vein was evaluated with ultrasound and shown to be patent. A permanent ultrasound image was obtained and placed in the patient's medical record. Using sterile gel  and a sterile probe cover, the right internal jugular vein was entered with a 21 ga needle during real time ultrasound guidance. 21 gauge needle exchanged for a transitional dilator set over 0.018 inch guidewire. Transitional dilator set exchanged for 10 fr sheath over 0.035 inch guidewire. Sheath placed to the infrarenal IVC and CO2 venogram performed to delineate the position of the renal veins. Retrievable Denali IVC filter deployed in the infrarenal location. Sheath removed and hemostasis achieved with manual compression. IMPRESSION: Retrievable Denali IVC filter placed. PLAN: This IVC filter is potentially retrievable. The patient will be assessed for filter retrieval by Interventional Radiology in approximately 8-12 weeks. Further recommendations regarding filter retrieval, continued surveillance or declaration of device permanence, will be made at that time. Electronically Signed   By: Acquanetta Belling M.D.   On: 05/27/2021 16:38   DG Chest Port 1 View  Result Date: 06/02/2021 CLINICAL DATA:  Increased white blood cell count.  Fever. EXAM: PORTABLE CHEST 1 VIEW COMPARISON:  May 31, 2021. FINDINGS: New medial left basilar/retrocardiac opacity. No visible pneumothorax or pleural effusions on this single semi erect radiograph. Cardiomediastinal silhouette is within normal limits and similar to prior. Enteric tube courses below the diaphragm with the tip outside the field of view. IMPRESSION: New medial left basilar/retrocardiac airspace opacity, which is suspicious for pneumonia given reported leukocytosis and fever. Electronically Signed   By: Feliberto Harts MD   On: 06/02/2021 13:57   DG Chest Port 1 View  Result Date: 05/31/2021 CLINICAL DATA:  Dyspnea EXAM: PORTABLE CHEST 1 VIEW COMPARISON:  Portable exam 0912 hours compared to 05/24/2021 FINDINGS: Feeding tube traverses esophagus and visualized stomach. Normal heart size, mediastinal contours, and pulmonary vascularity. Lungs clear. No pulmonary  infiltrate, pleural effusion, or pneumothorax. Osseous structures unremarkable. IMPRESSION: No acute abnormalities. Electronically Signed   By: Ulyses Southward M.D.   On: 05/31/2021 10:54   DG CHEST PORT 1 VIEW  Result Date: 05/24/2021 CLINICAL DATA:  Leukocytosis EXAM: PORTABLE CHEST 1 VIEW COMPARISON:  04/28/15 FINDINGS: Cardiac shadow is within normal limits. Mild aortic calcifications are noted. The lungs are well aerated bilaterally with minimal left basilar atelectasis. No bony abnormality is seen. IMPRESSION: Minimal left basilar atelectasis. Electronically Signed   By: Alcide Clever M.D.   On: 05/24/2021 10:52   DG Abd Portable 1V  Result Date: 05/24/2021 CLINICAL DATA:  Feeding tube placement EXAM: PORTABLE ABDOMEN - 1 VIEW COMPARISON:  05/20/2021 FINDINGS: Limited radiograph of the lower chest and upper abdomen was obtained for the purposes of enteric tube localization. Enteric tube is seen coursing below the diaphragm with distal tip terminating within the expected location of the gastric body. Air-filled large and small bowel loops throughout the abdomen, similar to prior. IMPRESSION: Enteric tube tip terminates within the gastric body. Electronically Signed   By: Duanne Guess D.O.   On: 05/24/2021 13:50   EEG adult  Result Date: 05/23/2021 Charlsie Quest, MD     05/23/2021  4:11 PM Patient Name: Donald [name] MRN: [id number] Epilepsy Attending: Charlsie Quest Referring Physician/Provider: Dr Bing Neighbors Date: 05/23/2021 Duration: 25.36 mins Patient history:  65 y.o. male currently admitted for ICH on 05/11/21 developed somnolence and aphasia with R gaze deviation that resolved with ativan. EEG to evaluate for seizure Level of alertness: asleep AEDs during EEG study: LEV Technical aspects: This EEG study was done with scalp electrodes positioned according to the 10-20 International system of electrode placement. Electrical activity was acquired at a sampling rate of 500Hz  and reviewed  with a high frequency filter of 70Hz  and a low frequency filter of 1Hz . EEG data were recorded continuously and digitally stored. Description: No posterior dominant rhythm was seen. Sleep was characterized by sleep spindles (12 to 14 Hz), maximal frontocentral region. EEG showed continuous generalized 3 to 6 Hz theta-delta slowing. There is an excessive amount of 15 to 18 Hz, 2-3 uV beta activity distributed symmetrically and diffusely.  Hyperventilation and photic stimulation were not performed.   ABNORMALITY - Continuous slow, generalized - Excessive beta, generalized IMPRESSION: This study is suggestive of moderate to severe diffuse encephalopathy, nonspecific etiology. The excessive beta activity seen in the background is most likely due to the effect of benzodiazepine and is a benign EEG pattern. No seizures or epileptiform discharges were seen throughout the recording. Priyanka Annabelle Harman   Overnight EEG with video  Result Date: 05/24/2021 Charlsie Quest, MD     05/25/2021  9:42 AM Patient Name: Donald [name] MRN: [id number] Epilepsy Attending: Charlsie Quest Referring Physician/Provider: Dr Bing Neighbors Duration: 05/23/2021 1655 to 05/24/2021 1655  Patient history:  65 y.o. male currently admitted for ICH on 05/11/21 developed somnolence and aphasia with R gaze deviation that resolved with ativan. EEG to evaluate for seizure  Level of alertness: awake, asleep  AEDs during EEG study: LEV  Technical aspects: This EEG study was done with scalp electrodes positioned according to the 10-20 International system of electrode placement. Electrical activity was acquired at a sampling rate of 500Hz  and reviewed with a high frequency filter of 70Hz  and a low frequency filter of 1Hz . EEG data were recorded continuously and digitally stored.  Description: No posterior dominant rhythm was seen. Sleep was characterized by sleep spindles (12 to 14 Hz), asymmetry ( R<L) maximal frontocentral region. EEG showed continuous  generalized and lateralized right hemisphere 3 to 6 Hz theta-delta slowing. Hyperventilation and photic stimulation were not performed.    ABNORMALITY - Continuous slow, generalized and lateralized right hemisphere - Spindle asymmetry, right<left  IMPRESSION: This study is suggestive of cortical dysfunction in right hemisphere likely secondary to underlying structural abnormality.  Additionally there is moderate to severe diffuse encephalopathy, nonspecific etiology. No seizures or definite epileptiform discharges were seen throughout the recording.   Charlsie Quest   MR MRV HEAD W WO CONTRAST  Result Date: 05/30/2021 CLINICAL DATA:  Right parenchymal hemorrhage.  Abnormal arteriogram. EXAM: MR VENOGRAM HEAD WITHOUT AND WITH CONTRAST TECHNIQUE: Angiographic images of the intracranial venous structures were acquired using MRV technique without and with intravenous contrast. CONTRAST:  6.69mL GADAVIST GADOBUTROL 1 MMOL/ML IV SOLN COMPARISON:  Cerebral arteriogram 05/28/2021. MR venogram 05/12/2021. FINDINGS: Stable appearance of dominant left transverse sinus. Postcontrast images demonstrate no thrombus. Straight sinus and deep cerebral veins are intact. Cortical veins are unremarkable. Superior sagittal sinus normal. Sagittal sinus and intra on the jugular veins are normal bilaterally. IMPRESSION: Normal variant MR venogram without significant change. No sinus thrombosis. Electronically Signed   By: Marin Roberts M.D.   On: 05/30/2021 12:49   MR MRV HEAD W WO CONTRAST  Result Date: 05/12/2021 CLINICAL DATA:  Intracranial hemorrhage.  EXAM: MR VENOGRAM HEAD WITHOUT AND WITH CONTRAST TECHNIQUE: Angiographic images of the intracranial venous structures were acquired using MRV technique without and with intravenous contrast. CONTRAST:  7mL GADAVIST GADOBUTROL 1 MMOL/ML IV SOLN COMPARISON:  Brain MRI performed earlier today 05/12/2021. Noncontrast head CT examinations 05/11/2021. CT angiogram head/neck  05/11/2021. FINDINGS: The superior sagittal sinus, internal cerebral veins, vein of Galen, straight sinus, transverse sinuses, sigmoid sinuses and visualized jugular veins are patent. No appreciable intracranial venous thrombosis. Hypoplastic right transverse and sigmoid dural venous sinuses. An acute parenchymal hemorrhage within the posterior right frontal lobe is grossly unchanged in size as compared to the brain MRI performed earlier today. There is mild curvilinear vascular enhancement overlying the site of hemorrhage, which may be reactive. Elsewhere, no abnormal intracranial enhancement is identified. IMPRESSION: No evidence of intracranial venous thrombosis. Non dominant right transverse and sigmoid dural venous sinuses. Electronically Signed   By: Jackey Loge DO   On: 05/12/2021 11:27   ECHOCARDIOGRAM COMPLETE  Result Date: 05/11/2021    ECHOCARDIOGRAM REPORT   Patient Name:   Donald [name] Date of Exam: 05/11/2021 Medical Rec #:  [id number]        Height:       68.0 in Accession #:    [id number]       Weight:       155.2 lb Date of Birth:  07-14-1956         BSA:          1.835 m Patient Age:    65 years         BP:           115/77 mmHg Patient Gender: M                HR:           84 bpm. Exam Location:  Inpatient Procedure: 2D Echo, Cardiac Doppler and Color Doppler Indications:    Stroke I63.9  History:        Patient has no prior history of Echocardiogram examinations.  Sonographer:    Roosvelt Maser RDCS Referring Phys: [id number] [facility] IMPRESSIONS  1. Left ventricular ejection fraction, by estimation, is 65 to 70%. The left ventricle has normal function. The left ventricle has no regional wall motion abnormalities. Left ventricular diastolic parameters were normal.  2. Right ventricular systolic function is normal. The right ventricular size is normal. Tricuspid regurgitation signal is inadequate for assessing PA pressure.  3. The mitral valve is normal in structure. No evidence of mitral  valve regurgitation.  4. The aortic valve was not well visualized. Aortic valve regurgitation is not visualized. No aortic stenosis is present.  5. The inferior vena cava is normal in size with greater than 50% respiratory variability, suggesting right atrial pressure of 3 mmHg. FINDINGS  Left Ventricle: Left ventricular ejection fraction, by estimation, is 65 to 70%. The left ventricle has normal function. The left ventricle has no regional wall motion abnormalities. The left ventricular internal cavity size was normal in size. There is  no left ventricular hypertrophy. Left ventricular diastolic parameters were normal. Right Ventricle: The right ventricular size is normal. No increase in right ventricular wall thickness. Right ventricular systolic function is normal. Tricuspid regurgitation signal is inadequate for assessing PA pressure. Left Atrium: Left atrial size was normal in size. Right Atrium: Right atrial size was normal in size. Pericardium: There is no evidence of pericardial effusion. Mitral Valve: The mitral valve is normal in structure. No evidence  of mitral valve regurgitation. Tricuspid Valve: The tricuspid valve is normal in structure. Tricuspid valve regurgitation is not demonstrated. Aortic Valve: The aortic valve was not well visualized. Aortic valve regurgitation is not visualized. No aortic stenosis is present. Aortic valve mean gradient measures 3.0 mmHg. Aortic valve peak gradient measures 6.7 mmHg. Aortic valve area, by VTI measures 2.77 cm. Pulmonic Valve: The pulmonic valve was not well visualized. Pulmonic valve regurgitation is not visualized. Aorta: The aortic root and ascending aorta are structurally normal, with no evidence of dilitation. Venous: The inferior vena cava is normal in size with greater than 50% respiratory variability, suggesting right atrial pressure of 3 mmHg. IAS/Shunts: The interatrial septum was not well visualized.  LEFT VENTRICLE PLAX 2D LVIDd:         4.90 cm   Diastology LVIDs:         2.80 cm  LV e' medial:    8.38 cm/s LV PW:         0.80 cm  LV E/e' medial:  9.0 LV IVS:        0.90 cm  LV e' lateral:   9.14 cm/s LVOT diam:     1.90 cm  LV E/e' lateral: 8.2 LV SV:         69 LV SV Index:   37 LVOT Area:     2.84 cm  RIGHT VENTRICLE RV Basal diam:  2.90 cm LEFT ATRIUM           Index       RIGHT ATRIUM           Index LA diam:      3.10 cm 1.69 cm/m  RA Area:     11.20 cm LA Vol (A2C): 26.8 ml 14.61 ml/m RA Volume:   21.90 ml  11.94 ml/m LA Vol (A4C): 39.2 ml 21.36 ml/m  AORTIC VALVE AV Area (Vmax):    2.64 cm AV Area (Vmean):   2.59 cm AV Area (VTI):     2.77 cm AV Vmax:           129.00 cm/s AV Vmean:          86.700 cm/s AV VTI:            0.248 m AV Peak Grad:      6.7 mmHg AV Mean Grad:      3.0 mmHg LVOT Vmax:         120.00 cm/s LVOT Vmean:        79.200 cm/s LVOT VTI:          0.242 m LVOT/AV VTI ratio: 0.98  AORTA Ao Root diam: 2.50 cm Ao Asc diam:  3.30 cm MITRAL VALVE MV Area (PHT): 3.60 cm     SHUNTS MV Decel Time: 211 msec     Systemic VTI:  0.24 m MV E velocity: 75.30 cm/s   Systemic Diam: 1.90 cm MV A velocity: 107.00 cm/s MV E/A ratio:  0.70 Epifanio Lesches MD Electronically signed by Epifanio Lesches MD Signature Date/Time: 05/11/2021/5:28:15 PM    Final    CT HEAD CODE STROKE WO CONTRAST  Result Date: 05/23/2021 CLINICAL DATA:  Code stroke.  Acute stroke. EXAM: CT HEAD WITHOUT CONTRAST TECHNIQUE: Contiguous axial images were obtained from the base of the skull through the vertex without intravenous contrast. COMPARISON:  Head CT from 05/11/2021 FINDINGS: Brain: 5.5 x 2.8 cm hematoma in the subcortical high right frontal region with extensive vasogenic edema in the adjacent brain that is progressed.  There is worsening local mass effect with midline shift now measuring 13 mm. No entrapment. No cytotoxic edema seen. Vascular: No hyperdense vessel or unexpected calcification. Skull: Normal. Negative for fracture or focal lesion.  Sinuses/Orbits: Negative Other: Critical Value/emergent results were called by telephone at the time of interpretation on 05/23/2021 at 9:10 am to provider [facility] , who verbally acknowledged these results. ASPECTS [facility] Stroke Program Early CT Score) Not scored in this setting IMPRESSION: Progressed right frontal hematoma and vasogenic edema with midline shift now measuring 13 mm. Electronically Signed   By: Marnee Spring M.D.   On: 05/23/2021 09:12   CT HEAD CODE STROKE WO CONTRAST  Result Date: 05/11/2021 CLINICAL DATA:  Code stroke. Left-sided weakness, nausea, and vomiting. EXAM: CT HEAD WITHOUT CONTRAST TECHNIQUE: Contiguous axial images were obtained from the base of the skull through the vertex without intravenous contrast. COMPARISON:  None. FINDINGS: Brain: An acute parenchymal hemorrhage in the high posterior right frontal lobe measures 4.2 x 2.3 x 3.1 cm (approximate volume of 15 mL). There is mild surrounding edema without midline shift, and there is a small amount of adjacent subarachnoid hemorrhage. A very small amount of coexistent subdural hemorrhage is also not excluded in this region. No acute infarct is identified elsewhere. The ventricles are normal in size. Hypodensities in the cerebral white matter bilaterally are nonspecific but compatible with mild chronic small vessel ischemic disease. Vascular: No hyperdense vessel. Skull: No fracture or suspicious osseous lesion. Sinuses/Orbits: Small osteoma in the left frontal sinus. Mild right and moderate left maxillary sinus mucosal thickening. Clear mastoid air cells. Unremarkable orbits. Other: None. ASPECTS [facility] Stroke Program Early CT Score) Not scored in the presence of acute hemorrhage. IMPRESSION: Acute parenchymal hemorrhage in the posterior right frontal lobe with small amount of adjacent subarachnoid hemorrhage. These results were communicated to Dr. Iver Nestle at 9:08 am on 05/11/2021 by text page via the [facility] messaging  system. Electronically Signed   By: Sebastian Ache M.D.   On: 05/11/2021 09:12   VAS [place] LOWER EXTREMITY VENOUS (DVT)  Result Date: 05/26/2021  Lower Venous DVT Study Patient Name:  Donald [name]  Date of Exam:   05/26/2021 Medical Rec #: [id number]         Accession #:    [id number] Date of Birth: 1956/11/13          Patient Gender: M Patient Age:   65Y Exam Location:  [facility] Procedure:      VAS [place] LOWER EXTREMITY VENOUS (DVT) Referring Phys: [id number] Eliezer Champagne --------------------------------------------------------------------------------  Indications: Fever.  Limitations: Poor ultrasound/tissue interface. Comparison Study: No previous exams. Performing Technologist: Ernestene Mention RVT, RDMS  Examination Guidelines: A complete evaluation includes B-mode imaging, spectral Doppler, color Doppler, and power Doppler as needed of all accessible portions of each vessel. Bilateral testing is considered an integral part of a complete examination. Limited examinations for reoccurring indications may be performed as noted. The reflux portion of the exam is performed with the patient in reverse Trendelenburg.  +---------+---------------+---------+-----------+----------+--------------+ RIGHT    CompressibilityPhasicitySpontaneityPropertiesThrombus Aging +---------+---------------+---------+-----------+----------+--------------+ CFV      Full           Yes      Yes                                 +---------+---------------+---------+-----------+----------+--------------+ SFJ      Full                                                        +---------+---------------+---------+-----------+----------+--------------+  FV Prox  Full           Yes      Yes                                 +---------+---------------+---------+-----------+----------+--------------+ FV Mid   Full           Yes      Yes                                  +---------+---------------+---------+-----------+----------+--------------+ FV DistalFull           Yes      Yes                                 +---------+---------------+---------+-----------+----------+--------------+ PFV      Full                                                        +---------+---------------+---------+-----------+----------+--------------+ POP      Full           Yes      Yes                                 +---------+---------------+---------+-----------+----------+--------------+ PTV      Full                                                        +---------+---------------+---------+-----------+----------+--------------+ PERO     Full                                                        +---------+---------------+---------+-----------+----------+--------------+   +---------+---------------+---------+-----------+----------+-------------------+ LEFT     CompressibilityPhasicitySpontaneityPropertiesThrombus Aging      +---------+---------------+---------+-----------+----------+-------------------+ CFV      Partial        Yes      Yes                  Acute- non                                                                occlusive           +---------+---------------+---------+-----------+----------+-------------------+ SFJ      Full                                                             +---------+---------------+---------+-----------+----------+-------------------+  FV Prox  Full           Yes      Yes                                      +---------+---------------+---------+-----------+----------+-------------------+ FV Mid   Full           Yes      Yes                                      +---------+---------------+---------+-----------+----------+-------------------+ FV DistalFull           Yes      Yes                                       +---------+---------------+---------+-----------+----------+-------------------+ PFV      Full                                                             +---------+---------------+---------+-----------+----------+-------------------+ POP      Full           Yes      Yes                                      +---------+---------------+---------+-----------+----------+-------------------+ PTV                                                   Not well visualized +---------+---------------+---------+-----------+----------+-------------------+ PERO     Full                                                             +---------+---------------+---------+-----------+----------+-------------------+ EIV                     Yes      Yes                  Patent by color and                                                       doppler             +---------+---------------+---------+-----------+----------+-------------------+     Summary: BILATERAL: - No evidence of superficial venous thrombosis in the lower extremities, bilaterally. -No evidence of popliteal cyst, bilaterally. RIGHT: - There is no evidence of deep vein thrombosis in the lower extremity.  LEFT: - Findings consistent with acute deep vein thrombosis involving the left common femoral vein.  *  See table(s) above for measurements and observations. Electronically signed by Coral Else MD on 05/26/2021 at 7:50:00 PM.    Final    CT ANGIO HEAD NECK W WO CM (CODE STROKE)  Result Date: 05/23/2021 CLINICAL DATA:  Enlarging hematoma EXAM: CT ANGIOGRAPHY HEAD AND NECK TECHNIQUE: Multidetector CT imaging of the head and neck was performed using the standard protocol during bolus administration of intravenous contrast. Multiplanar CT image reconstructions and MIPs were obtained to evaluate the vascular anatomy. Carotid stenosis measurements (when applicable) are obtained utilizing NASCET criteria, using the distal internal  carotid diameter as the denominator. CONTRAST:  50mL OMNIPAQUE IOHEXOL 350 MG/ML SOLN COMPARISON:  CTA 05/11/2021 FINDINGS: CTA NECK FINDINGS Aortic arch: Normal Right carotid system: Widely patent vessels with smooth contour. No atheromatous changes Left carotid system: Widely patent vessels with smooth contour. No noted atheromatous changes Vertebral arteries: No proximal subclavian stenosis. The vertebral arteries are widely patent. No suspected vertebral beading when allowing for streak artifact. Skeleton: Multilevel degenerative disc narrowing and ridging canal and foraminal encroachment the lower cervical levels. Other neck: No evidence of inflammation or mass. Upper chest: Negative Review of the MIP images confirms the above findings CTA HEAD FINDINGS Anterior circulation: Major vessels are smooth and widely patent. No branch occlusion, generalized beading, or aneurysm. There is a cortical branch at the right frontal parietal convexity which is not continuous beyond the level of the upper and lateral hematoma. No visible nidus or early enhancing veins. Posterior circulation: Vertebral and basilar arteries are smooth and widely patent. No branch occlusion, beading, or aneurysm. Venous sinuses: MRV was obtained previously. The veins are largely nonenhancing on this study. Anatomic variants: None significant Review of the MIP images confirms the above findings IMPRESSION: Seemingly truncated arterial cortical branch at the upper and lateral hematoma, without discrete vascular malformation or spot sign. Recommend follow-up after resolution of mass effect to evaluate for small AVM. Electronically Signed   By: Marnee Spring M.D.   On: 05/23/2021 09:54   CT ANGIO HEAD NECK W WO CM (CODE STROKE)  Result Date: 05/11/2021 CLINICAL DATA:  Stroke follow-up.  Left-sided weakness. EXAM: CT ANGIOGRAPHY HEAD AND NECK TECHNIQUE: Multidetector CT imaging of the head and neck was performed using the standard protocol  during bolus administration of intravenous contrast. Multiplanar CT image reconstructions and MIPs were obtained to evaluate the vascular anatomy. Carotid stenosis measurements (when applicable) are obtained utilizing NASCET criteria, using the distal internal carotid diameter as the denominator. CONTRAST:  50mL OMNIPAQUE IOHEXOL 350 MG/ML SOLN COMPARISON:  Same day CT head. FINDINGS: CTA NECK FINDINGS Aortic arch: Great vessel origins are patent. Right carotid system: No evidence of dissection, stenosis (50% or greater) or occlusion. Mild apparent irregularity of the ICA at the skull base in a region of streak artifact. Left carotid system: No evidence of dissection, stenosis (50% or greater) or occlusion. Mild apparent irregularity of the ICA at the skull base in a region of streak artifact. Vertebral arteries: Codominant. No evidence of dissection, stenosis (50% or greater) or occlusion. Skeleton: Moderate degenerative disc disease at at C5-C6 and C6-C7 with disc height loss, endplate sclerosis and posterior disc osteophyte complexes. Other neck: No acute abnormality Upper chest: Visualized lung apices are clear. Review of the MIP images confirms the above findings CTA HEAD FINDINGS Anterior circulation: No large vessel occlusion or proximal hemodynamically significant stenosis. Evaluation of the distal vasculature is limited due to venous contamination. No evidence of and arteriovenous malformation or aneurysm in the region of  intraparenchymal hemorrhage, although acute blood products limit evaluation. Posterior circulation: No large vessel occlusion or proximal hemodynamically significant stenosis. Venous sinuses: The superior sagittal sinus is narrowed in the region of hemorrhage, most likely from mass effect. Small right transverse and sigmoid sinuses. Review of the MIP images confirms the above findings IMPRESSION: CTA Head: 1. No large vessel occlusion or hemodynamically significant proximal arterial  stenosis. 2. The superior sagittal sinus is narrowed in the region of hemorrhage with poorly visualized draining cortical veins in this region. While indeterminate, this may be secondary to mass effect given no definite/clear intraluminal thrombus. Given these findings and the location of hemorrhage; however, recommend low threshold for follow-up CTV or MRI with contrast to ensure stability and exclude worsening thrombosis. 3. No evidence of and arteriovenous malformation or aneurysm in the region of intraparenchymal hemorrhage, although acute blood products limit evaluation. Follow-up imaging after resolution of hemorrhage could provide further assessment if clinically indicated. CTA Neck: 1. No significant (greater than 50%) stenosis. 2. Mild apparent irregularity of bilateral ICAs at the skull base is favored artifactual given streak artifact from dental amalgam in this region. Fibromuscular dysplasia is a differential consideration. Findings and recommendations discussed with Dr. Iver Nestle via telephone at 9:59 a.m. 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 EXAM: BILATERAL COMMON CAROTID AND INNOMINATE ANGIOGRAPHY COMPARISON:  CT angiogram of the head and neck of May 23, 2021, and May 11, 2021. MEDICATIONS: Heparin none. No antibiotic was administered within 1 hour of the procedure. ANESTHESIA/SEDATION: Versed 1.5 mg IV; Fentanyl 37.5 mcg IV Moderate Sedation Time:  52 minutes The patient was continuously monitored during the procedure by the interventional radiology nurse under my direct supervision. CONTRAST:  Isovue 300 approximately 80 mL. FLUOROSCOPY TIME:  Fluoroscopy Time: 15 minutes 24 seconds (1503 mGy). COMPLICATIONS: None immediate. TECHNIQUE: Informed written consent was obtained from the patient after a  thorough discussion of the procedural risks, benefits and alternatives. All questions were addressed. Maximal Sterile Barrier Technique was utilized including caps, mask, sterile gowns, sterile gloves, sterile drape, hand hygiene and skin antiseptic. A timeout was performed prior to the initiation of the procedure. The right groin was prepped and draped in the usual sterile fashion. Thereafter using modified Seldinger technique, transfemoral access into the right common femoral artery was obtained without difficulty. Over a 0.035 inch guidewire, a 5 French Pinnacle sheath was inserted. Through this, and also over 0.035 inch guidewire, a 5 [place] JB 1 catheter was advanced to the aortic arch region and selectively positioned in the right common carotid artery, the right vertebral artery, the left common carotid artery and the left vertebral artery. FINDINGS: The right vertebral artery origin is widely patent. The vessel is seen to opacify to the cranial skull base. Wide opacification is seen of right vertebrobasilar junction and the right posterior-inferior cerebellar artery. The basilar artery, the posterior cerebral arteries, the superior cerebellar arteries and the anterior-inferior cerebellar arteries opacify into the capillary and venous phases. Venous phase demonstrates non opacification of the right transverse sinus extending from the right lateral origin of the torcula to the transverse sigmoid junction. Retrograde opacification of the sigmoid sinus is seen from the superior cerebellar veins with retrograde flow into the right transverse sinus sigmoid junction. The right common carotid arteriogram demonstrates the right external carotid artery  and its major branches to be widely patent. The right internal carotid artery at the bulb to the cranial skull base is widely patent. The petrous, the cavernous and the supraclinoid segments are widely patent. The right middle cerebral artery is patent into the anterior  temporal branch and the bifurcation branches. Bundling of the right middle cerebral artery M3 M4 branch is noted secondary to the superiorly and the medially located intracranial hemorrhage. The right anterior cerebral artery opacifies into the capillary and venous phases. The venous phase demonstrates a minimally opacified right transverse sinus. The delayed lateral projection of the DSA demonstrates delayed clearance of the arterial portion of the contrast compared to the venous. The left common carotid arteriogram demonstrates the left external carotid artery and its major branches to be widely patent. The left internal carotid artery at the bulb to the cranial skull base is widely patent. The petrous, the cavernous and the supraclinoid segments are widely patent. The left middle cerebral and the left anterior cerebral artery opacify into the capillary and venous phases. Left vertebral origin is widely patent. The vessel is seen to opacify to the cranial skull base. The left vertebrobasilar junction and the left posterior-inferior cerebral artery demonstrate wide patency. The basilar artery, and the opacified portions of the posterior cerebral arteries, the superior cerebellar arteries and the anterior-inferior cerebellar arteries is seen into the capillary and the venous phases. The venous phase again demonstrates non opacification of the right transverse sinus with suggestion of small dural veins. Again demonstrated is the retrograde opacification of the right sigmoid sinus transverse sinus junction from the ipsilateral superior cerebellar venous structures. IMPRESSION: Non opacification of the right transverse sinus extending from the torcula to the right transverse sinus/sigmoid sinus junction with retrograde opacification of the right sigmoid sinus from the superior cerebellar veins on the on the right side. Above findings supportive of venous hypertension with delayed clearance of contrast from the right  cerebral hemisphere, and also with retrograde opacification of the right sigmoid sinus from the right superior cerebellar hemispheric veins. Attenuated caliber of the right sigmoid sinus noted though the right internal jugular vein appears comparable in size compared to the contralateral left side. Angiographically no evidence of intracranial aneurysm arteriovenous shunting, or of arteriovenous malformation or dissections. PLAN: Follow-up MRA of the brain, and MRV of the dural venous sinuses with and without contrast in 3 months. Electronically Signed   By: Julieanne CottonSanjeev  Deveshwar M.D.   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 EXAM: BILATERAL COMMON CAROTID AND INNOMINATE ANGIOGRAPHY COMPARISON:  CT angiogram of the head and neck of May 23, 2021, and May 11, 2021. MEDICATIONS: Heparin none. No antibiotic was administered within 1 hour of the procedure. ANESTHESIA/SEDATION: Versed 1.5 mg IV; Fentanyl 37.5 mcg IV Moderate Sedation Time:  52 minutes The patient was continuously monitored during the procedure by the interventional radiology nurse under my direct supervision. CONTRAST:  Isovue 300 approximately 80 mL. FLUOROSCOPY TIME:  Fluoroscopy Time: 15 minutes 24 seconds (1503 mGy). COMPLICATIONS: None immediate. TECHNIQUE: Informed written consent was obtained from the patient after a thorough discussion of the procedural risks, benefits and alternatives. All questions were addressed. Maximal Sterile Barrier Technique was utilized including caps, mask, sterile gowns, sterile gloves, sterile drape, hand hygiene and skin antiseptic. A timeout was performed prior to the initiation of the procedure. The right groin was prepped and draped  in the usual sterile fashion. Thereafter using modified Seldinger technique, transfemoral access into the right common femoral artery was obtained without  difficulty. Over a 0.035 inch guidewire, a 5 French Pinnacle sheath was inserted. Through this, and also over 0.035 inch guidewire, a 5 [place] JB 1 catheter was advanced to the aortic arch region and selectively positioned in the right common carotid artery, the right vertebral artery, the left common carotid artery and the left vertebral artery. FINDINGS: The right vertebral artery origin is widely patent. The vessel is seen to opacify to the cranial skull base. Wide opacification is seen of right vertebrobasilar junction and the right posterior-inferior cerebellar artery. The basilar artery, the posterior cerebral arteries, the superior cerebellar arteries and the anterior-inferior cerebellar arteries opacify into the capillary and venous phases. Venous phase demonstrates non opacification of the right transverse sinus extending from the right lateral origin of the torcula to the transverse sigmoid junction. Retrograde opacification of the sigmoid sinus is seen from the superior cerebellar veins with retrograde flow into the right transverse sinus sigmoid junction. The right common carotid arteriogram demonstrates the right external carotid artery and its major branches to be widely patent. The right internal carotid artery at the bulb to the cranial skull base is widely patent. The petrous, the cavernous and the supraclinoid segments are widely patent. The right middle cerebral artery is patent into the anterior temporal branch and the bifurcation branches. Bundling of the right middle cerebral artery M3 M4 branch is noted secondary to the superiorly and the medially located intracranial hemorrhage. The right anterior cerebral artery opacifies into the capillary and venous phases. The venous phase demonstrates a minimally opacified right transverse sinus. The delayed lateral projection of the DSA demonstrates delayed clearance of the arterial portion of the contrast compared to the venous. The left common carotid  arteriogram demonstrates the left external carotid artery and its major branches to be widely patent. The left internal carotid artery at the bulb to the cranial skull base is widely patent. The petrous, the cavernous and the supraclinoid segments are widely patent. The left middle cerebral and the left anterior cerebral artery opacify into the capillary and venous phases. Left vertebral origin is widely patent. The vessel is seen to opacify to the cranial skull base. The left vertebrobasilar junction and the left posterior-inferior cerebral artery demonstrate wide patency. The basilar artery, and the opacified portions of the posterior cerebral arteries, the superior cerebellar arteries and the anterior-inferior cerebellar arteries is seen into the capillary and the venous phases. The venous phase again demonstrates non opacification of the right transverse sinus with suggestion of small dural veins. Again demonstrated is the retrograde opacification of the right sigmoid sinus transverse sinus junction from the ipsilateral superior cerebellar venous structures. IMPRESSION: Non opacification of the right transverse sinus extending from the torcula to the right transverse sinus/sigmoid sinus junction with retrograde opacification of the right sigmoid sinus from the superior cerebellar veins on the on the right side. Above findings supportive of venous hypertension with delayed clearance of contrast from the right cerebral hemisphere, and also with retrograde opacification of the right sigmoid sinus from the right superior cerebellar hemispheric veins. Attenuated caliber of the right sigmoid sinus noted though the right internal jugular vein appears comparable in size compared to the contralateral left side. Angiographically no evidence of intracranial aneurysm arteriovenous shunting, or of arteriovenous malformation or dissections. PLAN: Follow-up MRA of the brain, and MRV of the dural venous sinuses with and without  contrast  in 3 months. Electronically Signed   By: Julieanne Cotton M.D.   On: 05/28/2021 16:01   (Echo, Carotid, EGD, Colonoscopy, ERCP)    Subjective:   Discharge Exam: Vitals:   06/04/21 0809 06/04/21 1140  BP: 121/80 (P) 125/73  Pulse: 97 (!) (P) 101  Resp: 16 (P) 16  Temp: 98.6 F (37 C) (P) 99.3 F (37.4 C)  SpO2: 100% (P) 100%   Vitals:   06/03/21 2350 06/04/21 0806 06/04/21 0809 06/04/21 1140  BP: 132/81 121/80 121/80 (P) 125/73  Pulse: 99 94 97 (!) (P) 101  Resp: 19 18 16  (P) 16  Temp: 98.2 F (36.8 C)  98.6 F (37 C) (P) 99.3 F (37.4 C)  TempSrc: Oral  Axillary (P) Axillary  SpO2: 100% 100% 100% (P) 100%  Weight:        General: Not in pain or dyspnea, deconditioned Neurology: awake, responds with yes and no to simple questions and follows simple commands. Continue to have left sided hemiparesis.  HEENT: no pallor, no icterus, oral mucosa moist Cardiovascular: No JVD. S1-S2 present, rhythmic, no gallops, rubs, or murmurs. No lower extremity edema. Pulmonary: positive breath sounds bilaterally, adequate air movement, no wheezing, rhonchi or rales. Gastrointestinal. Abdomen soft and non tender Skin. No rashes Musculoskeletal: no joint deformities    The results of significant diagnostics from this hospitalization (including imaging, microbiology, ancillary and laboratory) are listed below for reference.     Microbiology: Recent Results (from the past 240 hour(s))  Culture, Urine     Status: Abnormal   Collection Time: 05/26/21  8:58 AM   Specimen: Urine, Catheterized  Result Value Ref Range Status   Specimen Description URINE, CATHETERIZED  Final   Special Requests   Final    Normal Performed at [facility], [address], [place], [place]    Culture 30,000 COLONIES/mL STAPHYLOCOCCUS AUREUS (A)  Final   Report Status 05/28/2021 FINAL  Final   Organism ID, Bacteria STAPHYLOCOCCUS AUREUS (A)  Final      Susceptibility    Staphylococcus aureus - MIC*    CIPROFLOXACIN <=0.5 SENSITIVE Sensitive     GENTAMICIN <=0.5 SENSITIVE Sensitive     NITROFURANTOIN 32 SENSITIVE Sensitive     OXACILLIN <=0.25 SENSITIVE Sensitive     TETRACYCLINE <=1 SENSITIVE Sensitive     VANCOMYCIN 1 SENSITIVE Sensitive     TRIMETH/SULFA <=10 SENSITIVE Sensitive     CLINDAMYCIN <=0.25 SENSITIVE Sensitive     RIFAMPIN <=0.5 SENSITIVE Sensitive     Inducible Clindamycin NEGATIVE Sensitive     * 30,000 COLONIES/mL STAPHYLOCOCCUS AUREUS     Labs: BNP (last 3 results) No results for input(s): BNP in the last 8760 hours. Basic Metabolic Panel: Recent Labs  Lab 05/31/21 0449 06/01/21 0316 06/02/21 0344 06/03/21 0239 06/04/21 0837  NA 152* 147* 144 140 136  K 4.4 4.4 4.1 4.0 4.4  CL 112* 114* 110 108 106  CO2 32 29 27 26 23   GLUCOSE 210* 213* 200* 200* 115*  BUN 20 24* 24* 19 17  CREATININE 1.09 0.96 0.98 0.83 0.79  CALCIUM 9.1 9.0 8.8* 8.6* 8.5*   Liver Function Tests: No results for input(s): AST, ALT, ALKPHOS, BILITOT, PROT, ALBUMIN in the last 168 hours. No results for input(s): LIPASE, AMYLASE in the last 168 hours. No results for input(s): AMMONIA in the last 168 hours. CBC: Recent Labs  Lab 05/29/21 0617 05/31/21 0449 06/02/21 0344 06/03/21 0239 06/04/21 0837  WBC 12.9* 12.8* 20.7* 13.8*  11.2*  NEUTROABS  --  10.0* 18.0* 11.1* 9.1*  HGB 13.5 12.9* 12.1* 12.0* 12.5*  HCT 43.8 41.9 38.2* 36.5* 38.0*  MCV 95.4 96.8 94.1 92.2 91.6  PLT 434* 376 277 221 230   Cardiac Enzymes: No results for input(s): CKTOTAL, CKMB, CKMBINDEX, TROPONINI in the last 168 hours. BNP: Invalid input(s): POCBNP CBG: Recent Labs  Lab 06/03/21 1945 06/03/21 2355 06/04/21 0442 06/04/21 0806 06/04/21 1138  GLUCAP 85 102* 115* 130* 107*   D-Dimer No results for input(s): DDIMER in the last 72 hours. Hgb A1c No results for input(s): HGBA1C in the last 72 hours. Lipid Profile No results for input(s): CHOL, HDL, LDLCALC, TRIG,  CHOLHDL, LDLDIRECT in the last 72 hours. Thyroid function studies No results for input(s): TSH, T4TOTAL, T3FREE, THYROIDAB in the last 72 hours.  Invalid input(s): FREET3 Anemia work up No results for input(s): VITAMINB12, FOLATE, FERRITIN, TIBC, IRON, RETICCTPCT in the last 72 hours. Urinalysis    Component Value Date/Time   COLORURINE YELLOW 05/24/2021 2224   APPEARANCEUR CLOUDY (A) 05/24/2021 2224   APPEARANCEUR Clear 09/11/2020 1449   LABSPEC 1.023 05/24/2021 2224   PHURINE 6.0 05/24/2021 2224   GLUCOSEU 50 (A) 05/24/2021 2224   HGBUR MODERATE (A) 05/24/2021 2224   BILIRUBINUR NEGATIVE 05/24/2021 2224   BILIRUBINUR Negative 09/11/2020 1449   KETONESUR NEGATIVE 05/24/2021 2224   PROTEINUR 30 (A) 05/24/2021 2224   UROBILINOGEN 0.2 04/15/2016 1108   NITRITE POSITIVE (A) 05/24/2021 2224   LEUKOCYTESUR NEGATIVE 05/24/2021 2224   Sepsis Labs Invalid input(s): PROCALCITONIN,  WBC,  LACTICIDVEN Microbiology Recent Results (from the past 240 hour(s))  Culture, Urine     Status: Abnormal   Collection Time: 05/26/21  8:58 AM   Specimen: Urine, Catheterized  Result Value Ref Range Status   Specimen Description URINE, CATHETERIZED  Final   Special Requests   Final    Normal Performed at [facility], [address] [place], [place] [address]    Culture 30,000 COLONIES/mL STAPHYLOCOCCUS AUREUS (A)  Final   Report Status 05/28/2021 FINAL  Final   Organism ID, Bacteria STAPHYLOCOCCUS AUREUS (A)  Final      Susceptibility   Staphylococcus aureus - MIC*    CIPROFLOXACIN <=0.5 SENSITIVE Sensitive     GENTAMICIN <=0.5 SENSITIVE Sensitive     NITROFURANTOIN 32 SENSITIVE Sensitive     OXACILLIN <=0.25 SENSITIVE Sensitive     TETRACYCLINE <=1 SENSITIVE Sensitive     VANCOMYCIN 1 SENSITIVE Sensitive     TRIMETH/SULFA <=10 SENSITIVE Sensitive     CLINDAMYCIN <=0.25 SENSITIVE Sensitive     RIFAMPIN <=0.5 SENSITIVE Sensitive     Inducible Clindamycin NEGATIVE Sensitive     *  30,000 COLONIES/mL STAPHYLOCOCCUS AUREUS     Time coordinating discharge: 45 minutes  SIGNED:   Verdia Kuba, MD  [facility] 06/04/2021, 12:23 PM Pager   If 7PM-7AM, please contact night-coverage [email or web address] Password [email or web address]

## 2021-06-05 ENCOUNTER — Inpatient Hospital Stay (HOSPITAL_COMMUNITY): Payer: No Typology Code available for payment source

## 2021-06-05 LAB — COMPREHENSIVE METABOLIC PANEL
ALT: 222 U/L — ABNORMAL HIGH (ref 0–44)
AST: 88 U/L — ABNORMAL HIGH (ref 15–41)
Albumin: 1.9 g/dL — ABNORMAL LOW (ref 3.5–5.0)
Alkaline Phosphatase: 146 U/L — ABNORMAL HIGH (ref 38–126)
Anion gap: 10 (ref 5–15)
BUN: 18 mg/dL (ref 8–23)
CO2: 24 mmol/L (ref 22–32)
Calcium: 8.5 mg/dL — ABNORMAL LOW (ref 8.9–10.3)
Chloride: 98 mmol/L (ref 98–111)
Creatinine, Ser: 0.82 mg/dL (ref 0.61–1.24)
GFR, Estimated: >60 mL/min (ref >60)
Glucose, Bld: 146 mg/dL — ABNORMAL HIGH (ref 70–99)
Potassium: 3.7 mmol/L (ref 3.5–5.1)
Sodium: 132 mmol/L — ABNORMAL LOW (ref 135–145)
Total Bilirubin: 1 mg/dL (ref 0.3–1.2)
Total Protein: 6.6 g/dL (ref 6.5–8.1)

## 2021-06-05 LAB — CBC WITH DIFFERENTIAL/PLATELET
Abs Immature Granulocytes: 0.13 10*3/uL — ABNORMAL HIGH (ref 0.00–0.07)
Basophils Absolute: 0 10*3/uL (ref 0.0–0.1)
Basophils Relative: 0 %
Eosinophils Absolute: 0.1 10*3/uL (ref 0.0–0.5)
Eosinophils Relative: 1 %
HCT: 36.4 % — ABNORMAL LOW (ref 39.0–52.0)
Hemoglobin: 11.7 g/dL — ABNORMAL LOW (ref 13.0–17.0)
Immature Granulocytes: 2 %
Lymphocytes Relative: 19 %
Lymphs Abs: 1.6 10*3/uL (ref 0.7–4.0)
MCH: 29.3 pg (ref 26.0–34.0)
MCHC: 32.1 g/dL (ref 30.0–36.0)
MCV: 91.2 fL (ref 80.0–100.0)
Monocytes Absolute: 0.6 10*3/uL (ref 0.1–1.0)
Monocytes Relative: 7 %
Neutro Abs: 6.1 10*3/uL (ref 1.7–7.7)
Neutrophils Relative %: 71 %
Platelets: 269 10*3/uL (ref 150–400)
RBC: 3.99 MIL/uL — ABNORMAL LOW (ref 4.22–5.81)
RDW: 13.4 % (ref 11.5–15.5)
WBC: 8.5 10*3/uL (ref 4.0–10.5)
nRBC: 0 % (ref 0.0–0.2)

## 2021-06-05 LAB — VITAMIN B12: Vitamin B-12: 1773 pg/mL — ABNORMAL HIGH (ref 180–914)

## 2021-06-05 LAB — MAGNESIUM: Magnesium: 2.2 mg/dL (ref 1.7–2.4)

## 2021-06-05 LAB — GLUCOSE, CAPILLARY
Glucose-Capillary: 166 mg/dL — ABNORMAL HIGH (ref 70–99)
Glucose-Capillary: 138 mg/dL — ABNORMAL HIGH (ref 70–99)
Glucose-Capillary: 151 mg/dL — ABNORMAL HIGH (ref 70–99)
Glucose-Capillary: 100 mg/dL — ABNORMAL HIGH (ref 70–99)
Glucose-Capillary: 162 mg/dL — ABNORMAL HIGH (ref 70–99)

## 2021-06-05 LAB — RETICULOCYTES
Immature Retic Fract: 17.3 % — ABNORMAL HIGH (ref 2.3–15.9)
RBC.: 3.96 MIL/uL — ABNORMAL LOW (ref 4.22–5.81)
Retic Count, Absolute: 39.6 10*3/uL (ref 19.0–186.0)
Retic Ct Pct: 1 % (ref 0.4–3.1)

## 2021-06-05 LAB — IRON AND TIBC
Iron: 44 ug/dL — ABNORMAL LOW (ref 45–182)
Saturation Ratios: 18 % (ref 17.9–39.5)
TIBC: 241 ug/dL — ABNORMAL LOW (ref 250–450)
UIBC: 197 ug/dL

## 2021-06-05 LAB — FERRITIN: Ferritin: 976 ng/mL — ABNORMAL HIGH (ref 24–336)

## 2021-06-05 LAB — FOLATE: Folate: 15 ng/mL (ref >5.9)

## 2021-06-05 NOTE — Progress Notes (Signed)
PROGRESS NOTE   Subjective/Complaints: No complaints this morning Wife is at bedside Right hand very cold, able to squeeze mine with strong grip Makes good eye contact, vocalizes his name  ROS: denies pain   Objective:   DG Chest 2 View  Result Date: 06/05/2021 CLINICAL DATA:  Pneumonia EXAM: CHEST - 2 VIEW COMPARISON:  06/02/2021 FINDINGS: Retrocardiac infiltrate persists and is stable. The lungs are otherwise clear. No pneumothorax or pleural effusion. Cardiac size within normal limits. Pulmonary vascularity is normal. Nasoenteric feeding tube tip is seen within the expected mid to distal body of the stomach. IMPRESSION: Stable retrocardiac infiltrate. Electronically Signed   By: Helyn Numbers MD   On: 06/05/2021 07:04   DG Abd Portable 1V  Result Date: 06/04/2021 CLINICAL DATA:  Feeding tube placement EXAM: PORTABLE ABDOMEN - 1 VIEW COMPARISON:  Portable exam 1200 hours compared to 05/24/2021 FINDINGS: Tip of feeding tube projects over gastric antrum. IVC filter noted. Bowel gas pattern normal. Lung bases clear. IMPRESSION: Tip of feeding tube projects over gastric antrum. Electronically Signed   By: Ulyses Southward M.D.   On: 06/04/2021 14:05   Recent Labs    06/04/21 0837 06/05/21 0518  WBC 11.2* 8.5  HGB 12.5* 11.7*  HCT 38.0* 36.4*  PLT 230 269   Recent Labs    06/04/21 0837 06/05/21 0518  NA 136 132*  K 4.4 3.7  CL 106 98  CO2 23 24  GLUCOSE 115* 146*  BUN 17 18  CREATININE 0.79 0.82  CALCIUM 8.5* 8.5*    Intake/Output Summary (Last 24 hours) at 06/05/2021 1401 Last data filed at 06/05/2021 0417 Gross per 24 hour  Intake 1801.2 ml  Output 1650 ml  Net 151.2 ml        Physical Exam: Vital Signs Blood pressure 126/81, pulse 92, temperature 97.7 F (36.5 C), temperature source Oral, resp. rate 16, height 5\' 5"  (1.651 m), weight 62.9 kg, SpO2 100 %. Constitutional:      Comments: Up in bed with neck  flexed and gurgling sounds due to oral secretions. Attempted to suction secretions from oral cavity but he tended to clench down on tubing.   Cortak in place.   HENT:    Head:    Comments: Lips with peeling skin. Visible part of tongue noted to have white coating.  Eyes:    Comments: Eyes injected bilaterally  Cardiovascular:    Rate and Rhythm: Tachycardia present. Pulmonary:    Breath sounds: Rhonchi and rales (LUL/RUL) present. Abdomen: NT, ND Neurological:    Comments: Lethargic and unable to keep eyes open. He was briefly arousable and able to state his name--echolalia noted.  Unable to raise his head and kept head flexed to the right. Dense left hemiplegia. Able to squeeze by hand with his right hand- strong grip- hand is cold.    Assessment/Plan: 1. Functional deficits which require 3+ hours per day of interdisciplinary therapy in a comprehensive inpatient rehab setting. Physiatrist is providing close team supervision and 24 hour management of active medical problems listed below. Physiatrist and rehab team continue to assess barriers to discharge/monitor patient progress toward functional and medical goals  Care Tool:  Bathing  Body parts bathed by helper: Right arm, Left arm, Left lower leg, Face, Chest, Abdomen, Front perineal area, Buttocks, Right upper leg, Left upper leg, Right lower leg     Bathing assist Assist Level: 2 Helpers     Upper Body Dressing/Undressing Upper body dressing   What is the patient wearing?: Pull over shirt    Upper body assist Assist Level: Dependent - Patient 0%    Lower Body Dressing/Undressing Lower body dressing      What is the patient wearing?: Pants     Lower body assist Assist for lower body dressing: Dependent - Patient 0%     Toileting Toileting    Toileting assist Assist for toileting: Dependent - Patient 0%     Transfers Chair/bed transfer  Transfers assist            Locomotion Ambulation   Ambulation assist              Walk 10 feet activity   Assist           Walk 50 feet activity   Assist           Walk 150 feet activity   Assist           Walk 10 feet on uneven surface  activity   Assist           Wheelchair     Assist               Wheelchair 50 feet with 2 turns activity    Assist            Wheelchair 150 feet activity     Assist          Blood pressure 126/81, pulse 92, temperature 97.7 F (36.5 C), temperature source Oral, resp. rate 16, height 5\' 5"  (1.651 m), weight 62.9 kg, SpO2 100 %.    Medical Problem List and Plan: 1.  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              -patient may shower             -ELOS/Goals: MinA 3 weeks  Initial CIR evals today 2.  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 -DVT/anticoagulation:  Mechanical:  Antiembolism stockings, knee (TED hose) Bilateral lower extremities             -antiplatelet therapy: N/A due to concerns of CAA 3. Pain Management: N/A.  4. Mood:  LCSW to follow for evaluation and support.             -antipsychotic agents: N/A 5. Neuropsych: This patient is not fully capable of making decisions on his own behalf. 6. Skin/Wound Care: Routine pressure relief measures. 7. Fluids/Electrolytes/Nutrition: Monitor I/O--check lytes in am. 8. Recurrent aspiration event: On Unasyn Day #3-->transition to oral in 5 days?             --monitor temp curve as well as WBC as continues to have low grade fevers             --CXR reviewed and stable.  9. Leucocytosis: Resolving with resumption of atbx and down from 20.7-->11.2-->normal, repeat Monday 10. Blood pressure: Monitor BID--controlled without meds at this time. 11. Progressive cognitive decline: To reschedule dementia work up at [facility] after d/c. 12. Hyperglycemia: Hgb A1C- 5.8 and BS mildly elevated  due to tube feeds. Continue lantus 5 units daily for now.             --  Monitor BS every  4 hours and use SSI for elevated  BS 13. Dysphagia: Continue NPO status with TF for nutritional support.             --oral care every 4 hours while awake. 14. Tachycardia: continue to monitor HR TID 15. Elevated liver transaminases: discontinue Tylenol, and repeat CMP tomorrow.   LOS: 1 days A FACE TO FACE EVALUATION WAS PERFORMED  Clint Bolder P Fate Galanti 06/05/2021, 2:01 PM

## 2021-06-05 NOTE — Evaluation (Signed)
Speech Language Pathology Assessment and Plan  Patient Details  Name: Donald [name] MRN: [id number] Date of Birth: Nov 16, 1956  SLP Diagnosis: Dysphagia;Cognitive Impairments;Speech and Language deficits;Dysarthria  Rehab Potential: Good ELOS: 3-3.5 weeks   Today's Date: 06/05/2021 SLP Individual Time: 1105-1200 SLP Individual Time Calculation (min): 55 min  Hospital Problem: Principal Problem:   Intraparenchymal hematoma of brain (HCC)  Past Medical History:  Past Medical History:  Diagnosis Date   Arthritis    Past Surgical History:  Past Surgical History:  Procedure Laterality Date   APPENDECTOMY     IR ANGIO INTRA EXTRACRAN SEL COM CAROTID INNOMINATE BILAT MOD SED  05/28/2021   IR ANGIO VERTEBRAL SEL VERTEBRAL BILAT MOD SED  05/28/2021   IR IVC FILTER PLMT / S&I /IMG GUID/MOD SED  05/27/2021   IR US GUIDE VASC ACCESS RIGHT  05/27/2021    Assessment / Plan / Recommendation Clinical Impression Donald [name] is a 65 year old right-handed male with history of OA and and progressive dementia work-up who was originally admitted on 05/11/2021 with acute left hemiparesis with sensory deficits, right gaze preference and left facial droop due to 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  He was started on Cleviprex for BP control and Dr. Erlinda Hong question CAA as cause of bleed.  He continued to be limited by left-sided weakness with right gaze preference affecting ADLs and mobility.  He was admitted to CIR on 05/23/2021 for intensive inpatient rehab program.  He was found to have ileus but was asymptomatic and monitored.  Labs showed mild hyponatremia which was stable.  On a.m. on 06/12, he was noted to have decreasing level of consciousness with neurological changes and was transferred to acute for management.  CT of head done revealing interval progression of right frontal hematoma with worsening of mass-effect and midline shift measuring 13 mm.  He was started on hypertonic  saline and I am adamant administered due to concerns of seizure activity.     CT brain repeated showing truncated arterial cortical branch at upper and lateral hematoma without malformation or spot sign with recommendations to have follow-up study after resolution of mass-effect.  MRV repeated and was negative for thrombosis.  BLE Dopplers done 06/15 revealing acute DVT in left CFV and IVC filter placed by radiology.  He was placed on Keppra 1000 mg twice daily   EEG was negative for seizure activity. He was treated with 5 day course of rocephin due to concerns of  LLL PNA and is NPO with tube feeds for nutritional support.  Mentation has been improving and amantadine added to help with activation. On 06/20, he was noted to have copious oral secretions and on  developed fevers -T 100.5 with rise in WBC t 20.7. CXR done showing new medial basilar/retrocardiac airspace opacity concerning for PNA and he was started on Unasyn for treatment. Cortak displaced today and for MBS. Mentation improved with d/c of keppra but has had recurrent lethargy today.   He continues to be limited by left sided weakness with right gaze preference and inability to cross midline, cognitive deficits with delay in processing and motor planning deficits, left hemiplegia with emerging tone affecting mobility and ADLs CIR recommended due to functional decline.  Pt presents with severe multifactorial dysphagia characterized by decreased alertness for safe PO intake and left sided oral motor weakness.  Pt has severe L neglect, unable to bring head close to midline despite max multimodal cues. Pt also exhibited decreased management of his secretions and required frequent suctioning  to attempt to clear wet vocal quality and weak coughing. Pt able to throat clear and cough on command, however weak and ineffective. With limited trials of ice chips, pt demonstrated minimal and inefficient oral manipulation of boluses and both immediate and delayed  coughing.  His risk of aspiration remains high at this time and it is recommended that pt remain NPO with alternative means of nutrition and trials of POs with SLP only following thorough oral care.  Pt presents with mod impairment in expressive and receptive language at this time. Through portions of the MAST, he was able to name basic objects 4/5 and complete automatics (1-10, DOW) with 80% accuracy. Can state his name and wife's name consistently. Simple yes/no questions ~60% accurate, more complex yes/no <25%. 1-step directions 75% accurate, 2-step 25-50%. Performance throughout assessment impacted by fluctuating alertness. Pt with very low vocal volume, intelligibility impacted by secretions and wet, gurgling voice. Pt will benefit from skilled ST at CIR to target speech, language, swallowing, dysarthria and cognition to increase safety and independence at discharge.   Skilled Therapeutic Interventions          Pt participating in Bedside Swallow Evaluation, portions of MS Aphasia Screening Test and further non-standardized assessments of speech, language and cognition.    SLP Assessment  Patient will need skilled [facility] during CIR admission    Recommendations  SLP Diet Recommendations: NPO Medication Administration: Via alternative means Oral Care Recommendations: Oral care QID Patient destination: Home Follow up Recommendations: Home Health SLP;24 hour supervision/assistance Equipment Recommended: None recommended by SLP    SLP Frequency 3 to 5 out of 7 days   SLP Duration  SLP Intensity  SLP Treatment/Interventions 3-3.5 weeks  Minumum of 1-2 x/day, 30 to 90 minutes  Cognitive remediation/compensation;Dysphagia/aspiration precaution training;Internal/external aids;Speech/Language facilitation;Environmental controls;Cueing hierarchy;Functional tasks;Patient/family education;Therapeutic Exercise;Therapeutic Activities;Multimodal communication approach     Pain Pain Assessment Pain Scale: 0-10 Pain Score: 3  Pain Location: Neck Pain Descriptors / Indicators: Aching Pain Intervention(s): Rest;Repositioned  Prior Functioning Cognitive/Linguistic Baseline: Baseline deficits Baseline deficit details: 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 Type of Home: House  Lives With: Spouse Available Help at Discharge: Family;Available 24 hours/day Vocation: Retired  Programmer, systems Overall Cognitive Status: Impaired/Different from baseline Arousal/Alertness: Lethargic Orientation Level: Oriented to person Attention: Sustained Sustained Attention: Impaired Sustained Attention Impairment: Verbal basic;Functional basic Memory: Impaired Memory Impairment: Decreased recall of new information;Storage deficit;Retrieval deficit Immediate Memory Recall:  (unable) Memory Recall Sock: Not able to recall Memory Recall Blue: Not able to recall Memory Recall Bed: Not able to recall Awareness: Impaired Awareness Impairment: Intellectual impairment;Emergent impairment;Anticipatory impairment Problem Solving: Impaired Problem Solving Impairment: Functional basic Executive Function: Initiating;Reasoning;Sequencing;Organizing;Self Correcting;Self Monitoring Initiating: Impaired Initiating Impairment: Functional basic Safety/Judgment: Impaired  Comprehension Auditory Comprehension Overall Auditory Comprehension: Impaired Yes/No Questions: Impaired Basic Biographical Questions: 76-100% accurate Complex Questions: 50-74% accurate Commands: Impaired One Step Basic Commands: 75-100% accurate Two Step Basic Commands: 25-49% accurate Interfering Components: Attention;Processing speed;Working memory;Visual impairments EffectiveTechniques: Tour manager: Not tested Reading Comprehension Reading Status: Not tested Expression Expression Primary Mode of Expression: Verbal Verbal Expression Overall  Verbal Expression: Impaired Automatic Speech: Name;Counting;Day of week Level of Generative/Spontaneous Verbalization: Word;Phrase Interfering Components: Attention;Speech intelligibility Non-Verbal Means of Communication: Gestures (thumbs up and down occasionally) Written Expression Dominant Hand: Right Written Expression: Not tested Oral Motor Oral Motor/Sensory Function Overall Oral Motor/Sensory Function: Moderate impairment Facial ROM: Reduced left;Suspected CN VII (facial) dysfunction Facial Symmetry: Abnormal symmetry left;Suspected CN VII (facial) dysfunction Facial  Strength: Reduced left;Suspected CN VII (facial) dysfunction Lingual ROM: Reduced left;Suspected CN XII (hypoglossal) dysfunction Lingual Strength: Reduced;Suspected CN XII (hypoglossal) dysfunction Motor Speech Overall Motor Speech: Impaired Respiration: Impaired Level of Impairment: Word Phonation: Wet;Low vocal intensity;Hoarse Articulation: Impaired Level of Impairment: Word Intelligibility: Intelligibility reduced Word: 25-49% accurate Phrase: 0-24% accurate Motor Planning: Witnin functional limits Effective Techniques: Slow rate;Increased vocal intensity;Over-articulate;Pause;Pacing  Care Tool Care Tool Cognition Expression of Ideas and Wants Expression of Ideas and Wants: Rarely/Never expressess or very difficult - rarely/never expresses self or speech is very difficult to understand   Understanding Verbal and Non-Verbal Content Understanding Verbal and Non-Verbal Content: Sometimes understands - understands only basic conversations or simple, direct phrases. Frequently requires cues to understand   Memory/Recall Ability *first 3 days only Memory/Recall Ability *first 3 days only: That he or she is in a hospital/hospital unit    Intelligibility: Intelligibility reduced - impacted by significant oral and pharyngeal secretions Word: 25-49% accurate Phrase: 0-24% accurate  Bedside Swallowing  Assessment General Date of Onset: 05/11/21 Previous Swallow Assessment: see HPI Diet Prior to this Study: NPO Temperature Spikes Noted: No Respiratory Status: Room air History of Recent Intubation: No Behavior/Cognition: Cooperative;Requires cueing;Lethargic/Drowsy Oral Cavity - Dentition: Adequate natural dentition Self-Feeding Abilities: Total assist Vision: Impaired for self-feeding Patient Positioning: Upright in bed Baseline Vocal Quality: Low vocal intensity;Wet Volitional Cough: Weak;Wet Volitional Swallow: Able to elicit  Oral Care Assessment Does patient have any of the following "high(er) risk" factors?: Diet - patient on tube feedings Ice Chips Ice chips: Impaired Presentation: Spoon Oral Phase Impairments: Reduced lingual movement/coordination;Impaired mastication;Poor awareness of bolus Oral Phase Functional Implications: Prolonged oral transit Pharyngeal Phase Impairments: Suspected delayed Swallow;Wet Vocal Quality;Cough - Immediate Thin Liquid Thin Liquid: Not tested Nectar Thick Nectar Thick Liquid: Not tested Honey Thick Honey Thick Liquid: Not tested Puree Puree: Not tested Solid Solid: Not tested BSE Assessment Risk for Aspiration Impact on safety and function: Moderate aspiration risk Other Related Risk Factors: Lethargy;Cognitive impairment;Decreased management of secretions  Short Term Goals: Week 1: SLP Short Term Goal 1 (Week 1): Pt will consume therapeutic trials of ice chips with minimal overt s/s aspiration and mod A for use of swallow precautions SLP Short Term Goal 2 (Week 1): Patient will utiize visual aides to orient to time/date/place/situation with modA cues. SLP Short Term Goal 3 (Week 1): Pt will name common objects with 90% accuracy mod A cues SLP Short Term Goal 4 (Week 1): Pt will follow simple 1-2 step commands with 80% accuracy mod A cues SLP Short Term Goal 5 (Week 1): Pt will verbally communicate basic wants/needs in 2-3 words  phrases with mod A SLP Short Term Goal 6 (Week 1): Pt will attend to therapeutic tasks for >5 minutes provided mod A cues  Refer to Care Plan for Long Term Goals  Recommendations for other services: Neuropsych  Discharge Criteria: Patient will be discharged from SLP if patient refuses treatment 3 consecutive times without medical reason, if treatment goals not met, if there is a change in medical status, if patient makes no progress towards goals or if patient is discharged from hospital.  The above assessment, treatment plan, treatment alternatives and goals were discussed and mutually agreed upon: by patient and by family  [name] 06/05/2021, 12:28 PM

## 2021-06-05 NOTE — Evaluation (Signed)
Physical Therapy Assessment and Plan  Patient Details  Name: Donald [name] MRN: [id number] Date of Birth: 28-Dec-1955  PT Diagnosis: Abnormal posture, Abnormality of gait, Coordination disorder, Hemiparesis non-dominant, Impaired cognition, Impaired sensation, Muscle spasms, and Muscle weakness Rehab Potential:   ELOS: 28-32 days   Today's Date: 06/05/2021 PT Individual Time: 1306-1405 PT Individual Time Calculation (min): 59 min    Hospital Problem: Principal Problem:   Intraparenchymal hematoma of brain ([place])   Past Medical History:  Past Medical History:  Diagnosis Date   Arthritis    Past Surgical History:  Past Surgical History:  Procedure Laterality Date   APPENDECTOMY     IR ANGIO INTRA EXTRACRAN SEL COM CAROTID INNOMINATE BILAT MOD SED  05/28/2021   IR ANGIO VERTEBRAL SEL VERTEBRAL BILAT MOD SED  05/28/2021   IR IVC FILTER PLMT / S&I /IMG GUID/MOD SED  05/27/2021   IR US GUIDE VASC ACCESS RIGHT  05/27/2021    Assessment & Plan Clinical Impression: Patient is a 65 year old right-handed male with history of OA and and progressive dementia work-up who was originally admitted on 05/11/2021 with acute left hemiparesis with sensory deficits, right gaze preference and left facial droop due to 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  He was started on Cleviprex for BP control and Dr. Erlinda Hong question CAA as cause of bleed.  He continued to be limited by left-sided weakness with right gaze preference affecting ADLs and mobility.  He was admitted to [facility] on 05/23/2021 for intensive inpatient rehab program.  He was found to have ileus but was asymptomatic and monitored.  Labs showed mild hyponatremia which was stable.  On a.m. on 06/12, he was noted to have decreasing level of consciousness with neurological changes and was transferred to acute for management.  CT of head done revealing interval progression of right frontal hematoma with worsening of mass-effect and midline  shift measuring 13 mm.  He was started on hypertonic saline and I am adamant administered due to concerns of seizure activity.     CT brain repeated showing truncated arterial cortical branch at upper and lateral hematoma without malformation or spot sign with recommendations to have follow-up study after resolution of mass-effect.  MRV repeated and was negative for thrombosis.  BLE Dopplers done 06/15 revealing acute DVT in left CFV and IVC filter placed by radiology.  He was placed on Keppra 1000 mg twice daily   EEG was negative for seizure activity. He was treated with 5 day course of rocephin due to concerns of  LLL PNA and is NPO with tube feeds for nutritional support.  Mentation has been improving and amantadine added to help with activation. On 06/20, he was noted to have copious oral secretions and on  developed fevers -T 100.5 with rise in WBC t 20.7. CXR done showing new medial basilar/retrocardiac airspace opacity concerning for PNA and he was started on Unasyn for treatment. Cortak displaced today and for MBS. Mentation improved with d/c of keppra but has had recurrent lethargy today.   He continues to be limited by left sided weakness with right gaze preference and inability to cross midline, cognitive deficits with delay in processing and motor planning deficits, left hemiplegia with emerging tone affecting mobility and ADLs CIR recommended due to functional decline..  Patient transferred to CIR on 06/04/2021 .   Patient currently requires total with mobility secondary to muscle weakness, muscle joint tightness, and muscle paralysis, decreased cardiorespiratoy endurance, impaired timing and sequencing, abnormal tone, unbalanced muscle activation,  motor apraxia, decreased coordination, and decreased motor planning, decreased visual acuity, decreased visual perceptual skills, decreased visual motor skills, field cut, and hemianopsia, decreased midline orientation and decreased attention to left,  decreased initiation, decreased attention, decreased awareness, decreased problem solving, decreased safety awareness, decreased memory, and delayed processing, and decreased sitting balance, decreased standing balance, decreased postural control, hemiplegia, and decreased balance strategies.  Prior to hospitalization, patient was independent  with mobility and lived with Spouse in a House home.  Home access is 4Stairs to enter.  Patient will benefit from skilled PT intervention to maximize safe functional mobility, minimize fall risk, and decrease caregiver burden for planned discharge home with 24 hour assist.  Anticipate patient will  Benefit from SNF placement  at discharge.  PT - End of Session Activity Tolerance: Tolerates < 10 min activity, no significant change in vital signs Endurance Deficit: Yes Endurance Deficit Description: Unable to maintain eyes open throughout majority of session PT Assessment PT Barriers to Discharge: [place] environment;Decreased caregiver support;Home environment access/layout;Incontinence;Lack of/limited family support;Insurance for SNF coverage;Medication compliance;Behavior;Nutrition means PT Patient demonstrates impairments in the following area(s): Balance;Behavior;Endurance;Motor;Nutrition;Perception;Safety;Skin Integrity PT Transfers Functional Problem(s): Bed Mobility;Bed to Chair;Car;Furniture PT Locomotion Functional Problem(s): Ambulation;Wheelchair Mobility;Stairs PT Plan PT Intensity: Minimum of 1-2 x/day ,45 to 90 minutes PT Frequency: 5 out of 7 days PT Duration Estimated Length of Stay: 28-32 days PT Treatment/Interventions: Ambulation/gait training;Balance/vestibular training;Cognitive remediation/compensation;DME/adaptive equipment instruction;Discharge planning;Community reintegration;Disease management/prevention;Functional electrical stimulation;Functional mobility training;Neuromuscular re-education;Pain management;Patient/family  education;Psychosocial support;Skin care/wound management;Splinting/orthotics;Stair training;Therapeutic Activities;Therapeutic Exercise;Wheelchair propulsion/positioning;UE/LE Coordination activities;UE/LE Strength taining/ROM;Visual/perceptual remediation/compensation PT Transfers Anticipated Outcome(s): mod assis ttransfers to and fom bed PT Locomotion Anticipated Outcome(s): min-mod assist WC mobility. PT Recommendation Follow Up Recommendations: Home health PT;Skilled nursing facility Patient destination: [place] (SNF) Equipment Recommended: To be determined;Wheelchair cushion (measurements);Wheelchair (measurements)   PT Evaluation Precautions/Restrictions Precautions Precautions: Fall Precaution Comments: L hemiparesis, L inattention, pusher to the L, cognitive deficits, cortrak Restrictions Weight Bearing Restrictions: No General   Vital SignsTherapy Vitals Temp: 97.8 F (36.6 C) Pulse Rate: 99 Resp: 17 BP: 101/77 Patient Position (if appropriate): Lying Oxygen Therapy SpO2: 100 % O2 Device: Room Air Pain Pain Assessment Pain Scale: 0-10 Pain Score: 3  Pain Location: Neck Pain Descriptors / Indicators: Aching Pain Intervention(s): Rest;Repositioned Home Living/Prior Functioning Home Living Available Help at Discharge: Family;Available 24 hours/day Type of Home: House Home Access: Stairs to enter [employer] of Steps: 4 Entrance Stairs-Rails: Left Home Layout: One level Bathroom Shower/Tub: Multimedia programmer: Standard Bathroom Accessibility: Yes Additional Comments: has access to RW from pt's MIL  Lives With: Spouse Prior Function Level of Independence: Independent with basic ADLs  Able to Take Stairs?: Yes Driving: Yes 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 Vision/Perception  Vision - Assessment Eye Alignment: Impaired  (comment) Ocular Range of Motion: Restricted on the left Alignment/Gaze Preference: Head turned;Head tilt;Gaze right Tracking/Visual Pursuits: Requires cues, head turns, or add eye shifts to track;Decreased smoothness of vertical tracking;Decreased smoothness of eye movement to LEFT superior field;Decreased smoothness of eye movement to LEFT inferior field Saccades: Decreased speed of saccadic movement Perception Perception: Impaired Inattention/Neglect: Does not attend to left visual field;Does not attend to left side of body Praxis Praxis: Impaired Praxis Impairment Details: Initiation;Ideation;Motor planning  Cognition Overall Cognitive Status: Impaired/Different from baseline Arousal/Alertness: Lethargic Orientation Level: Oriented to person Attention: Sustained Sustained Attention: Impaired Sustained Attention Impairment: Verbal basic;Functional basic Memory: Impaired Memory Impairment: Decreased recall of  new information;Storage deficit;Retrieval deficit Immediate Memory Recall:  (unable) Memory Recall Sock: Not able to recall Memory Recall Blue: Not able to recall Memory Recall Bed: Not able to recall Awareness: Impaired Awareness Impairment: Intellectual impairment;Emergent impairment;Anticipatory impairment Problem Solving: Impaired Problem Solving Impairment: Functional basic Executive Function: Initiating;Reasoning;Sequencing;Organizing;Self Correcting;Self Monitoring Initiating: Impaired Initiating Impairment: Functional basic Safety/Judgment: Impaired Sensation Sensation Light Touch: Impaired Detail Light Touch Impaired Details: Impaired LUE;Impaired LLE Hot/Cold: Not tested Proprioception: Impaired by gross assessment Stereognosis: Impaired by gross assessment Additional Comments: intermittent hypersensitity to pressure and movement in the LLE Coordination Gross Motor Movements are Fluid and Coordinated: No Fine Motor Movements are Fluid and Coordinated:  No Coordination and Movement Description: dense L sided Hemiplegia Finger Nose Finger Test: unable to perform on L Heel Shin Test: poor awareness of positioning of the LLE. unable to perform on the L Motor  Motor Motor: Hemiplegia;Abnormal tone;Motor apraxia;Motor impersistence;Abnormal postural alignment and control Motor - Skilled Clinical Observations: Dense L hemiplegia   Trunk/Postural Assessment  Cervical Assessment Cervical Assessment: Exceptions to [facility] (gaze R/head forward- torticollis) Thoracic Assessment Thoracic Assessment: Exceptions to [facility] (severe rounded shoulders in sitting) Lumbar Assessment Lumbar Assessment: Exceptions to [facility] (post pelvic til, lean L) Postural Control Postural Control: Deficits on evaluation Head Control: L tilt Trunk Control: lateral bias to the l Righting Reactions: delayed on the R and absent on the L  Balance Balance Balance Assessed: Yes Static Sitting Balance Static Sitting - Balance Support: Right upper extremity supported;Feet supported Static Sitting - Level of Assistance: 1: +1 Total assist;2: Max assist Dynamic Sitting Balance Dynamic Sitting - Balance Support: Right upper extremity supported;Feet supported Dynamic Sitting - Level of Assistance: 1: +1 Total assist Extremity Assessment  RUE Assessment RUE Assessment: Within Functional Limits LUE Assessment LUE Assessment: Exceptions to [facility] Active Range of Motion (AROM) Comments: Brunnstrum level I in LUE, L hand Brunstrum levels for arm and hand: Arm;Hand Brunstrum level for arm: Stage I Presynergy Brunstrum level for hand: Stage I Flaccidity RLE Assessment RLE Assessment: Within Functional Limits General Strength Comments: grossly 4+/5 functionally LLE Assessment LLE Assessment: Exceptions to [facility] General Strength Comments: 0/5 throughout session  Care Tool Care Tool Bed Mobility Roll left and right activity   Roll left and right assist level: 2 Helpers    Sit to lying  activity   Sit to lying assist level: 2 Helpers    Lying to sitting edge of bed activity   Lying to sitting edge of bed assist level: 2 Helpers     Care Tool Transfers Sit to stand transfer Sit to stand activity did not occur: Safety/medical concerns      Chair/bed transfer   Chair/bed transfer assist level: Dependent - Librarian, academic transfer activity did not occur: Safety/medical concerns        Care Tool Locomotion Ambulation Ambulation activity did not occur: Safety/medical concerns        Walk 10 feet activity Walk 10 feet activity did not occur: Safety/medical concerns       Walk 50 feet with 2 turns activity Walk 50 feet with 2 turns activity did not occur: Safety/medical concerns      Walk 150 feet activity Walk 150 feet activity did not occur: Safety/medical concerns      Walk 10 feet on uneven surfaces activity Walk 10 feet on uneven surfaces activity did not occur: Safety/medical concerns      Stairs Stair  activity did not occur: Safety/medical concerns        Walk up/down 1 step activity Walk up/down 1 step or curb (drop down) activity did not occur: Safety/medical concerns     Walk up/down 4 steps activity did not occuR: Safety/medical concerns  Walk up/down 4 steps activity      Walk up/down 12 steps activity Walk up/down 12 steps activity did not occur: Safety/medical concerns      Pick up small objects from floor Pick up small object from the floor (from standing position) activity did not occur: Safety/medical concerns      Wheelchair Will patient use wheelchair at discharge?: Yes Type of Wheelchair: Manual   Wheelchair assist level: Dependent - Patient 0% Max wheelchair distance: 150  Wheel 50 feet with 2 turns activity   Assist Level: Dependent - Patient 0%  Wheel 150 feet activity   Assist Level: Dependent - Patient 0%    Refer to Care Plan for Long Term Goals  SHORT TERM GOAL WEEK  1 PT Short Term Goal 1 (Week 1): Pt will perform bed mobiltiy with max assist  of 1. PT Short Term Goal 2 (Week 1): Pt will tolerate sitting in WC >2 hours PT Short Term Goal 3 (Week 1): Pt will maintain sitting balance EOB with max assist up to 2 minutes PT Short Term Goal 4 (Week 1): Pt will transfer to [facility] with max assist of 2 consistently  Recommendations for other services: None   Skilled Therapeutic Intervention  Pt received supine in bed and agreeable to PT. Rolling R and L to clean following incontinent BM with total A +2 for R>L. [place] move transfer to [facility] with +2 assist. PT instructed patient in PT Evaluation and initiated treatment intervention; see above for results. PT educated patient in [place], rehab potential, rehab goals, and discharge recommendations along with recommendation for follow-up rehabilitation services.  Sitting balance in WC and EOB performed with max assist statically and total A for all reaching due to extreme anterior LOB and poor midline awareness. Pt returned to room and performed squat pivot transfer to bed with total A +2 for safety. Sit>supine completed with +2 assist, and left supine in bed with call bell in reach and all needs met.     Mobility Bed Mobility Rolling Right: 2 Helpers Rolling Left: 2 Helpers Supine to Sit: 2 Chartered certified accountant Transfers: 2 Helpers Transfer via Lift Equipment: Occupational psychologist Ambulation: No Gait Gait: No Stairs / Additional Locomotion Stairs: No Architect: Yes Wheelchair Assistance: Dependent - Patient 0% Distance: 150 in TIS [facility]   Discharge Criteria: Patient will be discharged from PT if patient refuses treatment 3 consecutive times without medical reason, if treatment goals not met, if there is a change in medical status, if patient makes no progress towards goals or if patient is discharged from hospital.  The above assessment, treatment plan, treatment  alternatives and goals were discussed and mutually agreed upon: by patient  Lorie Phenix 06/05/2021, 3:36 PM

## 2021-06-05 NOTE — Plan of Care (Signed)
  Problem: Consults Goal: RH STROKE PATIENT EDUCATION Description: See Patient Education module for education specifics  Outcome: Progressing Goal: Nutrition Consult-if indicated Outcome: Progressing   Problem: RH BOWEL ELIMINATION Goal: RH STG MANAGE BOWEL WITH ASSISTANCE Description: STG Manage Bowel with min Assistance. Outcome: Progressing   Problem: RH BLADDER ELIMINATION Goal: RH STG MANAGE BLADDER WITH ASSISTANCE Description: STG Manage Bladder With min Assistance Outcome: Progressing   Problem: RH SKIN INTEGRITY Goal: RH STG MAINTAIN SKIN INTEGRITY WITH ASSISTANCE Description: STG Maintain Skin Integrity With min Assistance. Outcome: Progressing   Problem: RH SAFETY Goal: RH STG ADHERE TO SAFETY PRECAUTIONS W/ASSISTANCE/DEVICE Description: STG Adhere to Safety Precautions With cues and reminders Outcome: Progressing   Problem: RH KNOWLEDGE DEFICIT Goal: RH STG INCREASE KNOWLEDGE OF HYPERTENSION Description: Pt and family will be able to demonstrate understanding of medication management, dietary and lifestyle modifications to better control blood pressure and prevent stroke using handouts and booklets provided with mod I assist.  Outcome: Progressing Goal: RH STG INCREASE KNOWLEGDE OF HYPERLIPIDEMIA Description: Pt and family will be able to demonstrate understanding of medication management, dietary and lifestyle modifications to better control cholesterol levels and prevent stroke using handouts and booklets provided with mod I assist.  Outcome: Progressing Goal: RH STG INCREASE KNOWLEDGE OF STROKE PROPHYLAXIS Description: Pt and family will be able to demonstrate understanding of medication management, dietary and lifestyle modifications to better control blood pressure and prevent stroke using handouts and booklets provided with mod I assist.  Outcome: Progressing   

## 2021-06-05 NOTE — Evaluation (Signed)
Occupational Therapy Assessment and Plan  Patient Details  Name: Donald [name] MRN: [id number] Date of Birth: [birth year]  OT Diagnosis: abnormal posture, acute pain, apraxia, cognitive deficits, disturbance of vision, flaccid hemiplegia and hemiparesis, hemiplegia affecting non-dominant side, muscle weakness (generalized), and pain in joint Rehab Potential: Rehab Potential (ACUTE ONLY): Fair ELOS: 3.5 to 4 weeks   Today's Date: 06/05/2021 OT Individual Time: 0900-1000 OT Individual Time Calculation (min): 60 min     Hospital Problem: Principal Problem:   Intraparenchymal hematoma of brain ([place])   Past Medical History:  Past Medical History:  Diagnosis Date   Arthritis    Past Surgical History:  Past Surgical History:  Procedure Laterality Date   APPENDECTOMY     IR ANGIO INTRA EXTRACRAN SEL COM CAROTID INNOMINATE BILAT MOD SED  05/28/2021   IR ANGIO VERTEBRAL SEL VERTEBRAL BILAT MOD SED  05/28/2021   IR IVC FILTER PLMT / S&I /IMG GUID/MOD SED  05/27/2021   IR US GUIDE VASC ACCESS RIGHT  05/27/2021    Assessment & Plan Clinical Impression: The pt is a Donald [name] re-admitted from [place] on 6/12 due to sudden onset of worsening mental status, seizures, and facial droop. Repeat CT revealed enlargement of original R frontal ICH with worsening midline shift. Significant PMH includes: memory deficits, OSA  Patient currently requires total with basic self-care skills secondary to muscle weakness, decreased cardiorespiratoy endurance, impaired timing and sequencing, abnormal tone, unbalanced muscle activation, motor apraxia, decreased coordination, and decreased motor planning, decreased visual acuity, decreased visual perceptual skills, and decreased visual motor skills, decreased midline orientation, decreased attention to left, left side neglect, decreased motor planning, and ideational apraxia, decreased initiation, decreased attention, decreased awareness, decreased problem solving,  decreased safety awareness, decreased memory, and delayed processing, and decreased sitting balance, decreased standing balance, decreased postural control, hemiplegia, and decreased balance strategies.  Prior to hospitalization, patient could complete BADL with independent .  Patient will benefit from skilled intervention to decrease level of assist with basic self-care skills and increase independence with basic self-care skills prior to discharge home with care partner.  Anticipate patient will require 24 hour supervision and moderate physical assestance and follow up home health and SNF may be required d/t potential BOC .  OT - End of Session Activity Tolerance: Tolerates < 10 min activity, no significant change in vital signs Endurance Deficit: Yes OT Assessment Rehab Potential (ACUTE ONLY): Fair OT Barriers to Discharge: Inaccessible home environment;Decreased caregiver support;Home environment access/layout;Incontinence OT Patient demonstrates impairments in the following area(s): Balance;Cognition;Endurance;Motor;Nutrition;Perception;Pain;Safety;Sensory;Vision;Skin Integrity OT Basic ADL's Functional Problem(s): Grooming;Eating;Bathing;Dressing;Toileting OT Transfers Functional Problem(s): Tub/Shower;Toilet OT Plan OT Intensity: Minimum of 1-2 x/day, 45 to 90 minutes OT Frequency: 5 out of 7 days OT Duration/Estimated Length of Stay: 3.5 to 4 weeks OT Treatment/Interventions: Balance/vestibular training;Cognitive remediation/compensation;Community reintegration;Discharge planning;Disease mangement/prevention;DME/adaptive equipment instruction;Functional electrical stimulation;Functional mobility training;Neuromuscular re-education;Pain management;Patient/family education;Psychosocial support;Self Care/advanced ADL retraining;Skin care/wound managment;Splinting/orthotics;Therapeutic Activities;Therapeutic Exercise;UE/LE Strength taining/ROM;UE/LE Coordination activities;Visual/perceptual  remediation/compensation;Wheelchair propulsion/positioning OT Self Feeding Anticipated Outcome(s): MIN depending on change in NPO status OT Basic Self-Care Anticipated Outcome(s): MOD OT Toileting Anticipated Outcome(s): MOD A bath; MAX toilet OT Bathroom Transfers Anticipated Outcome(s): MOD OT Recommendation Patient destination: Home Follow Up Recommendations: Home health OT;Skilled nursing facility Equipment Recommended: 3 in 1 bedside comode;To be determined   OT Evaluation Precautions/Restrictions  Precautions Precautions: Fall Precaution Comments: L hemiparesis, L inattention, pusher to the L, cognitive deficits, cortrak Restrictions Weight Bearing Restrictions: No General Chart Reviewed: Yes Family/Caregiver Present: Yes Vital Signs Therapy Vitals Pulse  Rate: 92 BP: 126/81 Pain Pain Assessment Pain Scale: 0-10 Pain Score: 3  Pain Location: Neck Pain Descriptors / Indicators: Aching Pain Intervention(s): Rest;Repositioned Home Living/Prior Functioning Home Living Family/patient expects to be discharged to:: Private residence Living Arrangements: Spouse/significant other Available Help at Discharge: Family, Available 24 hours/day Type of Home: House Home Access: Stairs to enter Technical brewer of Steps: 4 Entrance Stairs-Rails: Left Home Layout: One level Bathroom Shower/Tub: Multimedia programmer: Standard Bathroom Accessibility: Yes Additional Comments: has access to RW from pt's MIL  Lives With: Spouse IADL History Occupation: Full time employment Prior Function Level of Independence: Independent with basic ADLs  Able to Take Stairs?: Yes Driving: Yes 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 Vision Baseline Vision/History: No visual deficits Patient Visual Report: Other (comment) (able to shift eyes to midline) Vision Assessment?: Vision impaired- to be  further tested in functional context Ocular Range of Motion: Restricted on the left Alignment/Gaze Preference: Head turned;Head tilt;Gaze right Tracking/Visual Pursuits: Requires cues, head turns, or add eye shifts to track;Decreased smoothness of vertical tracking;Decreased smoothness of eye movement to LEFT superior field;Decreased smoothness of eye movement to LEFT inferior field Saccades: Decreased speed of saccadic movement Visual Fields: Left visual field deficit;Left homonymous hemianopsia Perception  Perception: Impaired Inattention/Neglect: Does not attend to left visual field;Does not attend to left side of body Praxis Praxis: Impaired Praxis Impairment Details: Initiation Cognition Overall Cognitive Status: Impaired/Different from baseline Arousal/Alertness: Lethargic Orientation Level: Person;Place Year: 2022 Month: May Day of Week: Incorrect Memory: Impaired Memory Impairment: Decreased recall of new information;Storage deficit;Retrieval deficit Immediate Memory Recall:  (unable) Memory Recall Sock: Not able to recall Memory Recall Blue: Not able to recall Memory Recall Bed: Not able to recall Attention: Sustained Sustained Attention: Impaired Sustained Attention Impairment: Verbal basic;Functional basic Awareness: Impaired Awareness Impairment: Intellectual impairment;Emergent impairment;Anticipatory impairment Problem Solving: Impaired Problem Solving Impairment: Functional basic Executive Function: Initiating;Reasoning;Sequencing;Organizing;Self Correcting;Self Monitoring Initiating: Impaired Initiating Impairment: Functional basic Safety/Judgment: Impaired Sensation Sensation Light Touch: Impaired Detail Light Touch Impaired Details: Impaired LUE;Impaired LLE Hot/Cold: Not tested Proprioception: Impaired by gross assessment Stereognosis: Impaired by gross assessment Coordination Gross Motor Movements are Fluid and Coordinated: No Fine Motor Movements are  Fluid and Coordinated: No Coordination and Movement Description: dense L sided Hemiplegia Finger Nose Finger Test: unable to perform on L Heel Shin Test: poor awareness of positioning of the LLE. unable to perform on the L Motor  Motor Motor: Hemiplegia;Abnormal tone;Motor apraxia;Motor impersistence;Abnormal postural alignment and control Motor - Skilled Clinical Observations: Dense L hemiplegia  Trunk/Postural Assessment  Cervical Assessment Cervical Assessment: Exceptions to [facility] (gaze R/head forward- torticollis) Thoracic Assessment Thoracic Assessment: Exceptions to [facility] (rounded shoulders) Lumbar Assessment Lumbar Assessment: Exceptions to [facility] (post pelvic til, lean L) Postural Control Postural Control: Deficits on evaluation Head Control: L tilt Trunk Control: lateral bias to the l Righting Reactions: delayed on the R and absent on the L  Balance Static Sitting Balance Static Sitting - Level of Assistance: 1: +2 Total assist Extremity/Trunk Assessment RUE Assessment RUE Assessment: Within Functional Limits LUE Assessment LUE Assessment: Exceptions to [facility] Active Range of Motion (AROM) Comments: Brunnstrum level I in LUE, L hand Brunstrum levels for arm and hand: Arm;Hand Brunstrum level for arm: Stage I Presynergy Brunstrum level for hand: Stage I Flaccidity  Care Tool Care Tool Self Care Eating        Oral Care         Bathing  Body parts bathed by helper: Right arm;Left arm;Left lower leg;Face;Chest;Abdomen;Front perineal area;Buttocks;Right upper leg;Left upper leg;Right lower leg   Assist Level: 2 Helpers    Upper Body Dressing(including orthotics)   What is the patient wearing?: Pull over shirt   Assist Level: Dependent - Patient 0%    Lower Body Dressing (excluding footwear)   What is the patient wearing?: Pants Assist for lower body dressing: Dependent - Patient 0%    Putting on/Taking off footwear   What is the patient wearing?: Non-skid slipper  socks Assist for footwear: Dependent - Patient 0%       Care Tool Toileting Toileting activity   Assist for toileting: Dependent - Patient 0%     Care Tool Bed Mobility Roll left and right activity   Roll left and right assist level: 2 Helpers    Sit to lying activity   Sit to lying assist level: 2 Helpers    Lying to sitting edge of bed activity         Care Tool Transfers Sit to stand transfer        Chair/bed transfer         Toilet transfer         Care Tool Cognition Expression of Ideas and Wants Expression of Ideas and Wants: Rarely/Never expressess or very difficult - rarely/never expresses self or speech is very difficult to understand   Understanding Verbal and Non-Verbal Content Understanding Verbal and Non-Verbal Content: Sometimes understands - understands only basic conversations or simple, direct phrases. Frequently requires cues to understand   Memory/Recall Ability *first 3 days only Memory/Recall Ability *first 3 days only: That he or she is in a hospital/hospital unit    Refer to Care Plan for [place] 1 OT Short Term Goal 1 (Week 1): Pt will maintain static sitting balance with max A of 1 in prep for seated ADL. OT Short Term Goal 2 (Week 1): Pt will completed grooming task with mod A in supported sitting. OT Short Term Goal 3 (Week 1): Pt will don shirt with max A in supported sitting. OT Short Term Goal 4 (Week 1): Pt will maintain midline orientation in supported sitting with overall mod A.  Recommendations for other services: None    Skilled Therapeutic Intervention 1:1. Pt received in bed agreeable to OT with thumbs up. Pt only able to verbalize "ow" when OT faciliatting midline with cervical rotation and lateral flexion to L. Pt completes bed level BADL with total A required and demo poor activitiy tolernace closing eyes after UB dressing. Pt demo severe L neglect to space and body impacting performance of BADLs  and functional mobility. Wife present and OT discusses home set up and need for physical assistance at home. Exited session with pt seated in bed, exit alarm on and call light in reach    ADL ADL Eating: NPO Grooming: Dependent Upper Body Bathing: Dependent Where Assessed-Upper Body Bathing: Bed level Lower Body Bathing: Dependent Where Assessed-Lower Body Bathing: Bed level Upper Body Dressing: Dependent Where Assessed-Upper Body Dressing: Bed level Lower Body Dressing: Dependent Where Assessed-Lower Body Dressing: Bed level Toileting: Dependent Where Assessed-Toileting: Bed level Toilet Transfer: Not assessed Tub/Shower Transfer: Not assessed Walk-In Shower Transfer: Not assessed Mobility  Bed Mobility Rolling Right: 2 Helpers Rolling Left: 2 Helpers   Discharge Criteria: Patient will be discharged from OT if patient refuses treatment 3 consecutive times without medical reason, if treatment goals not met, if there is a  change in medical status, if patient makes no progress towards goals or if patient is discharged from hospital.  The above assessment, treatment plan, treatment alternatives and goals were discussed and mutually agreed upon: by patient  Tonny Branch 06/05/2021, 12:28 PM

## 2021-06-06 ENCOUNTER — Inpatient Hospital Stay (HOSPITAL_COMMUNITY): Payer: No Typology Code available for payment source

## 2021-06-06 DIAGNOSIS — R7401 Elevation of levels of liver transaminase levels: Secondary | ICD-10-CM | POA: Diagnosis present

## 2021-06-06 LAB — COMPREHENSIVE METABOLIC PANEL
ALT: 228 U/L — ABNORMAL HIGH (ref 0–44)
AST: 127 U/L — ABNORMAL HIGH (ref 15–41)
Albumin: 2.2 g/dL — ABNORMAL LOW (ref 3.5–5.0)
Alkaline Phosphatase: 144 U/L — ABNORMAL HIGH (ref 38–126)
Anion gap: 8 (ref 5–15)
BUN: 18 mg/dL (ref 8–23)
CO2: 23 mmol/L (ref 22–32)
Calcium: 8.7 mg/dL — ABNORMAL LOW (ref 8.9–10.3)
Chloride: 103 mmol/L (ref 98–111)
Creatinine, Ser: 0.78 mg/dL (ref 0.61–1.24)
GFR, Estimated: >60 mL/min (ref >60)
Glucose, Bld: 166 mg/dL — ABNORMAL HIGH (ref 70–99)
Potassium: 4.7 mmol/L (ref 3.5–5.1)
Sodium: 134 mmol/L — ABNORMAL LOW (ref 135–145)
Total Bilirubin: 1.1 mg/dL (ref 0.3–1.2)
Total Protein: 7 g/dL (ref 6.5–8.1)

## 2021-06-06 LAB — GLUCOSE, CAPILLARY
Glucose-Capillary: 154 mg/dL — ABNORMAL HIGH (ref 70–99)
Glucose-Capillary: 161 mg/dL — ABNORMAL HIGH (ref 70–99)
Glucose-Capillary: 195 mg/dL — ABNORMAL HIGH (ref 70–99)
Glucose-Capillary: 144 mg/dL — ABNORMAL HIGH (ref 70–99)
Glucose-Capillary: 116 mg/dL — ABNORMAL HIGH (ref 70–99)
Glucose-Capillary: 106 mg/dL — ABNORMAL HIGH (ref 70–99)

## 2021-06-06 NOTE — Consult Note (Signed)
Medical Consultation   Donald [name]  YHC:[id number]  DOB: 16-Feb-1956  DOA: 06/04/2021  PCP: [name], MD   Outpatient Specialists: none   Requesting physician: Dr. Carlis Abbott  Reason for consultation: Transaminitis    History of Present Illness: Donald [name] is an 65 y.o. male with history of OA and progressive dementia work up who was admitted on 05/11/21 for acute right posterior front lobe parenchymal hemorrhage. He was then admitted to [facility] on 05/23/2021 for an inpatient rehab program due to left sided weakness with right gaze affecting his mobility. He had decreasing levels of consciousness and CT head revealed interval progression of right frontal hematoma with worsening mass-effect and midline shift and admitted back to neuro ICU. He then developed acute DVT in left CFV and IVC filter was placed by radiology on 05/26/21. He was treated with 5 days of rocephin for concerns of LLL PNA. He has been NPO with tube feeds. 05/31/21 he developed fevers with increased oral secretions and rise in WBC. CXR showed new findings concerning for PNA and he was started on unasyn for treatment. He has been discharged back to CIR.   We were asked to consult on patient due to rising LFTs. He does not drink alcohol. Has had tylenol PRN while in hospital. He is not volume overloaded. History from wife who says he has no hx of sickle cell or trait and states they were tested a long time ago. No family history of blood disorders or hemochromatosis. He does not eat a lot of red meat and only takes a MV. Has not had any blood transfusions.    Past Medical History: Past Medical History:  Diagnosis Date   Arthritis     Past Surgical History: Past Surgical History:  Procedure Laterality Date   APPENDECTOMY     IR ANGIO INTRA EXTRACRAN SEL COM CAROTID INNOMINATE BILAT MOD SED  05/28/2021   IR ANGIO VERTEBRAL SEL VERTEBRAL BILAT MOD SED  05/28/2021   IR IVC FILTER PLMT / S&I /IMG  GUID/MOD SED  05/27/2021   IR US GUIDE VASC ACCESS RIGHT  05/27/2021     Allergies:   Allergies  Allergen Reactions   Sulfa Antibiotics Other (See Comments)    Nausea and eye swelling      Social History:  reports that he has never smoked. He has never used smokeless tobacco. He reports that he does not drink alcohol and does not use drugs.   Family History: Family History  Problem Relation Age of Onset   Hypertension Mother    Colon polyps Neg Hx    Colon cancer Neg Hx    Esophageal cancer Neg Hx    Rectal cancer Neg Hx    Stomach cancer Neg Hx     Family history reviewed and not pertinent    Physical Exam: Vitals:   06/05/21 1931 06/06/21 0500 06/06/21 0508 06/06/21 0857  BP: 122/87  111/73 129/89  Pulse: 94  91 92  Resp: 16  18   Temp: 98.8 F (37.1 C)  98.1 F (36.7 C)   TempSrc: Oral  Oral   SpO2: 100%  100% 100%  Weight:  63.2 kg    Height:        Constitutional: Alert and awake, not in any acute distress. Eyes: PERLA, EOMI, irises appear normal, rightward drift with eye gaze.  ENMT: external ears and nose appear normal, hearing appears intact.  Lips appears normal, oropharynx mucosa, tongue, posterior pharynx appear normal. Right sided mouth droop  Neck: neck appears normal, no masses, normal ROM, no thyromegaly, no JVD  CVS: S1-S2 clear, no murmur rubs or gallops, no LE edema, normal pedal pulses  Respiratory:  clear to auscultation bilaterally, no wheezing, rales or rhonchi. Respiratory effort normal. No accessory muscle use.  Abdomen: soft nontender, nondistended, normal bowel sounds, no hepatosplenomegaly, no hernias  Musculoskeletal: : no cyanosis, clubbing or edema noted bilaterally                        Neuro: hemiplegia of left upper and lower extremities.  Psych: stable mood, flat affect. Mildly dysarthric speech.  Skin: no rashes or lesions or ulcers, no induration or nodules. He has no palmar erythema or telangiectasias.     Data reviewed:  I have personally reviewed following labs and imaging studies Labs:  CBC: Recent Labs  Lab 05/31/21 0449 06/02/21 0344 06/03/21 0239 06/04/21 0837 06/05/21 0518  WBC 12.8* 20.7* 13.8* 11.2* 8.5  NEUTROABS 10.0* 18.0* 11.1* 9.1* 6.1  HGB 12.9* 12.1* 12.0* 12.5* 11.7*  HCT 41.9 38.2* 36.5* 38.0* 36.4*  MCV 96.8 94.1 92.2 91.6 91.2  PLT 376 277 221 230 269    Basic Metabolic Panel: Recent Labs  Lab 06/02/21 0344 06/03/21 0239 06/04/21 0837 06/05/21 0518 06/06/21 0604  NA 144 140 136 132* 134*  K 4.1 4.0 4.4 3.7 4.7  CL 110 108 106 98 103  CO2 27 26 23 24 23   GLUCOSE 200* 200* 115* 146* 166*  BUN 24* 19 17 18 18   CREATININE 0.98 0.83 0.79 0.82 0.78  CALCIUM 8.8* 8.6* 8.5* 8.5* 8.7*  MG  --   --   --  2.2  --    GFR Estimated Creatinine Clearance: 81.1 mL/min (by C-G formula based on SCr of 0.78 mg/dL). Liver Function Tests: Recent Labs  Lab 06/05/21 0518 06/06/21 0604  AST 88* 127*  ALT 222* 228*  ALKPHOS 146* 144*  BILITOT 1.0 1.1  PROT 6.6 7.0  ALBUMIN 1.9* 2.2*   No results for input(s): LIPASE, AMYLASE in the last 168 hours. No results for input(s): AMMONIA in the last 168 hours. Coagulation profile No results for input(s): INR, PROTIME in the last 168 hours.  Cardiac Enzymes: No results for input(s): CKTOTAL, CKMB, CKMBINDEX, TROPONINI in the last 168 hours. BNP: Invalid input(s): POCBNP CBG: Recent Labs  Lab 06/05/21 2210 06/06/21 0118 06/06/21 0512 06/06/21 0755 06/06/21 1153  GLUCAP 162* 154* 161* 195* 144*   D-Dimer No results for input(s): DDIMER in the last 72 hours. Hgb A1c No results for input(s): HGBA1C in the last 72 hours. Lipid Profile No results for input(s): CHOL, HDL, LDLCALC, TRIG, CHOLHDL, LDLDIRECT in the last 72 hours. Thyroid function studies No results for input(s): TSH, T4TOTAL, T3FREE, THYROIDAB in the last 72 hours.  Invalid input(s): FREET3 Anemia work up Recent Labs    06/05/21 0518   VITAMINB12 1,773*  FOLATE 15.0  FERRITIN 976*  TIBC 241*  IRON 44*  RETICCTPCT 1.0   Urinalysis    Component Value Date/Time   COLORURINE YELLOW 05/24/2021 2224   APPEARANCEUR CLOUDY (A) 05/24/2021 2224   APPEARANCEUR Clear 09/11/2020 1449   LABSPEC 1.023 05/24/2021 2224   PHURINE 6.0 05/24/2021 2224   GLUCOSEU 50 (A) 05/24/2021 2224   HGBUR MODERATE (A) 05/24/2021 2224   BILIRUBINUR NEGATIVE 05/24/2021 2224   BILIRUBINUR Negative 09/11/2020 1449   KETONESUR NEGATIVE 05/24/2021  2224   PROTEINUR 30 (A) 05/24/2021 2224   UROBILINOGEN 0.2 04/15/2016 1108   NITRITE POSITIVE (A) 05/24/2021 2224   LEUKOCYTESUR NEGATIVE 05/24/2021 2224     Sepsis Labs Invalid input(s): PROCALCITONIN,  WBC,  LACTICIDVEN Microbiology No results found for this or any previous visit (from the past 240 hour(s)).     Inpatient Medications:   Scheduled Meds:  amantadine  100 mg Per Tube BID   chlorhexidine  15 mL Mouth Rinse BID   chlorhexidine  15 mL Mouth Rinse BID   Chlorhexidine Gluconate Cloth  6 each Topical Daily   feeding supplement (PROSource TF)  45 mL Per Tube Daily   free water  250 mL Per Tube Q4H   insulin aspart  0-9 Units Subcutaneous Q4H   insulin glargine  5 Units Subcutaneous Daily   mouth rinse  15 mL Mouth Rinse q12n4p   Continuous Infusions:  ampicillin-sulbactam (UNASYN) IV 3 g (06/06/21 0848)   feeding supplement (OSMOLITE 1.5 CAL) 50 mL/hr at 06/05/21 0417     Radiological Exams on Admission: DG Chest 2 View  Result Date: 06/05/2021 CLINICAL DATA:  Pneumonia EXAM: CHEST - 2 VIEW COMPARISON:  06/02/2021 FINDINGS: Retrocardiac infiltrate persists and is stable. The lungs are otherwise clear. No pneumothorax or pleural effusion. Cardiac size within normal limits. Pulmonary vascularity is normal. Nasoenteric feeding tube tip is seen within the expected mid to distal body of the stomach. IMPRESSION: Stable retrocardiac infiltrate. Electronically Signed   By: Helyn Numbers MD   On: 06/05/2021 07:04    Impression/Recommendations  Transaminitis -recommended RUQ ultrasound and hepatitis panel -hold tylenol.  -trend hepatic panel and ferritin/iron studies. Appears to be acute.  -ferritin is 976 and likely iron overloaded with evidence of possible liver damage. Recommended testing for hemachromatosis and consulting hematology for therapeutic phlebotomy/further work up as I do not know cause of iron overload. Does not appear that he received any blood transfusions, no hx of sickle cell, liver US to r/o liver disease, vs. Other rare causes. May need liver MRI if continue to trend upwards to assess possible liver iron deposition.  -Patient has mild anemia at 11.7.  -also can commonly see unasyn increase LFTs, but in setting of such markedly raised ferritin, likely secondary to iron overload.  -avoid exogenous iron intake    Thank you for this consultation.  Please reach out again if you need our team to follow along.   Time Spent: 30 minutes.   Orland Mustard M.D. [facility] 06/06/2021, 2:01 PM

## 2021-06-06 NOTE — Progress Notes (Addendum)
PROGRESS NOTE   Subjective/Complaints: Wife felt deep suctioning was helpful in clearing secretions and has allowed him to better communicate. When I asked patient who is next to him he says "the most beautiful woman in the world"- huge improvement!!  ROS: denies pain, +secretions   Objective:   DG Chest 2 View  Result Date: 06/05/2021 CLINICAL DATA:  Pneumonia EXAM: CHEST - 2 VIEW COMPARISON:  06/02/2021 FINDINGS: Retrocardiac infiltrate persists and is stable. The lungs are otherwise clear. No pneumothorax or pleural effusion. Cardiac size within normal limits. Pulmonary vascularity is normal. Nasoenteric feeding tube tip is seen within the expected mid to distal body of the stomach. IMPRESSION: Stable retrocardiac infiltrate. Electronically Signed   By: Helyn Numbers MD   On: 06/05/2021 07:04   Recent Labs    06/04/21 0837 06/05/21 0518  WBC 11.2* 8.5  HGB 12.5* 11.7*  HCT 38.0* 36.4*  PLT 230 269   Recent Labs    06/05/21 0518 06/06/21 0604  NA 132* 134*  K 3.7 4.7  CL 98 103  CO2 24 23  GLUCOSE 146* 166*  BUN 18 18  CREATININE 0.82 0.78  CALCIUM 8.5* 8.7*    Intake/Output Summary (Last 24 hours) at 06/06/2021 1304 Last data filed at 06/06/2021 0900 Gross per 24 hour  Intake 968 ml  Output 650 ml  Net 318 ml        Physical Exam: Vital Signs Blood pressure 129/89, pulse 92, temperature 98.1 F (36.7 C), temperature source Oral, resp. rate 18, height 5\' 5"  (1.651 m), weight 63.2 kg, SpO2 100 %. Constitutional:      Comments: Up in bed with neck flexed and gurgling sounds due to oral secretions. Attempted to suction secretions from oral cavity but he tended to clench down on tubing.   Cortak in place.   HENT:    Head:    Comments: Lips with peeling skin. Visible part of tongue noted to have white coating.  Eyes:    Comments: Eyes injected bilaterally  Cardiovascular:    Rate and Rhythm: Tachycardia  present. Pulmonary:    Breath sounds: Rhonchi and rales (LUL/RUL) present. Abdomen: NT, ND Neurological:    Comments: Much more alert and maintains eye focus.  Unable to raise his head and kept head flexed to the right. Dense left hemiplegia. Able to squeeze by hand with his right hand- strong grip- hand is cold. Improved communication   Assessment/Plan: 1. Functional deficits which require 3+ hours per day of interdisciplinary therapy in a comprehensive inpatient rehab setting. Physiatrist is providing close team supervision and 24 hour management of active medical problems listed below. Physiatrist and rehab team continue to assess barriers to discharge/monitor patient progress toward functional and medical goals  Care Tool:  Bathing        Body parts bathed by helper: Right arm     Bathing assist Assist Level: 2 Helpers     Upper Body Dressing/Undressing Upper body dressing   What is the patient wearing?: Pull over shirt    Upper body assist Assist Level: Dependent - Patient 0%    Lower Body Dressing/Undressing Lower body dressing      What is  the patient wearing?: Hospital gown only     Lower body assist Assist for lower body dressing: Dependent - Patient 0%     Toileting Toileting    Toileting assist Assist for toileting: Dependent - Patient 0%     Transfers Chair/bed transfer  Transfers assist     Chair/bed transfer assist level: Dependent - mechanical lift     Locomotion Ambulation   Ambulation assist   Ambulation activity did not occur: Safety/medical concerns          Walk 10 feet activity   Assist  Walk 10 feet activity did not occur: Safety/medical concerns        Walk 50 feet activity   Assist Walk 50 feet with 2 turns activity did not occur: Safety/medical concerns         Walk 150 feet activity   Assist Walk 150 feet activity did not occur: Safety/medical concerns         Walk 10 feet on uneven surface   activity   Assist Walk 10 feet on uneven surfaces activity did not occur: Safety/medical concerns         Wheelchair     Assist Will patient use wheelchair at discharge?: Yes Type of Wheelchair: Manual    Wheelchair assist level: Dependent - Patient 0% Max wheelchair distance: 150    Wheelchair 50 feet with 2 turns activity    Assist        Assist Level: Dependent - Patient 0%   Wheelchair 150 feet activity     Assist      Assist Level: Dependent - Patient 0%   Blood pressure 129/89, pulse 92, temperature 98.1 F (36.7 C), temperature source Oral, resp. rate 18, height 5\' 5"  (1.651 m), weight 63.2 kg, SpO2 100 %.    Medical Problem List and Plan: 1.  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              -patient may shower             -ELOS/Goals: MinA 3 weeks  Continue CIR 2.  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 Wife prefers no oral anticoagulation given hematoma.  -DVT/anticoagulation:  Mechanical:  Antiembolism stockings, knee (TED hose) Bilateral lower extremities             -antiplatelet therapy: N/A due to concerns of CAA 3. Pain Management: N/A.  4. Mood:  LCSW to follow for evaluation and support.             -antipsychotic agents: N/A 5. Neuropsych: This patient is not fully capable of making decisions on his own behalf. 6. Skin/Wound Care: Routine pressure relief measures. 7. Fluids/Electrolytes/Nutrition: Monitor I/O--check lytes in am. 8. Recurrent aspiration event: On Unasyn Day #3-->transition to oral in 5 days?             --monitor temp curve as well as WBC as continues to have low grade fevers             --CXR reviewed and stable.  9. Leucocytosis: Resolving with resumption of atbx and down from 20.7-->11.2-->normal, repeat Monday 10. Blood pressure: Monitor BID--controlled without meds at this time. 11. Progressive cognitive decline: To reschedule dementia  work up at [facility] after d/c. 12. Hyperglycemia: Hgb A1C- 5.8 and BS mildly elevated due to tube feeds. Continue lantus 5 units daily for now.             --Monitor BS every  4 hours and use SSI for elevated  BS 13. Dysphagia: Continue NPO status with TF for nutritional support.             --oral care every 4 hours while awake. 14. Tachycardia: continue to monitor HR TID 15. Elevated liver transaminases: discontinue Tylenol, rising on repeat CMP. Curbsided medicine who recommended liver US (ordered for today) and hepatitis panel (ordered with tomorrow's labs). Advised that combo of Unasyn and Augmentin could have caused it- now only on Unasyn- reconsult if continuing to worsen. Ferritin high- check hemochromatosis lab tomorrow and consider consulting hematology for phlebotomy if still rising.   LOS: 2 days A FACE TO FACE EVALUATION WAS PERFORMED  Clint Bolder P Paymon Rosensteel 06/06/2021, 1:04 PM

## 2021-06-06 NOTE — Plan of Care (Signed)
  Problem: Consults Goal: RH STROKE PATIENT EDUCATION Description: See Patient Education module for education specifics  Outcome: Progressing Goal: Nutrition Consult-if indicated Outcome: Progressing   Problem: RH BOWEL ELIMINATION Goal: RH STG MANAGE BOWEL WITH ASSISTANCE Description: STG Manage Bowel with min Assistance. Outcome: Progressing   Problem: RH BLADDER ELIMINATION Goal: RH STG MANAGE BLADDER WITH ASSISTANCE Description: STG Manage Bladder With min Assistance Outcome: Progressing   Problem: RH SKIN INTEGRITY Goal: RH STG MAINTAIN SKIN INTEGRITY WITH ASSISTANCE Description: STG Maintain Skin Integrity With min Assistance. Outcome: Progressing   Problem: RH SAFETY Goal: RH STG ADHERE TO SAFETY PRECAUTIONS W/ASSISTANCE/DEVICE Description: STG Adhere to Safety Precautions With cues and reminders Outcome: Progressing   Problem: RH KNOWLEDGE DEFICIT Goal: RH STG INCREASE KNOWLEDGE OF HYPERTENSION Description: Pt and family will be able to demonstrate understanding of medication management, dietary and lifestyle modifications to better control blood pressure and prevent stroke using handouts and booklets provided with mod I assist.  Outcome: Progressing Goal: RH STG INCREASE KNOWLEGDE OF HYPERLIPIDEMIA Description: Pt and family will be able to demonstrate understanding of medication management, dietary and lifestyle modifications to better control cholesterol levels and prevent stroke using handouts and booklets provided with mod I assist.  Outcome: Progressing Goal: RH STG INCREASE KNOWLEDGE OF STROKE PROPHYLAXIS Description: Pt and family will be able to demonstrate understanding of medication management, dietary and lifestyle modifications to better control blood pressure and prevent stroke using handouts and booklets provided with mod I assist.  Outcome: Progressing   

## 2021-06-07 DIAGNOSIS — I611 Nontraumatic intracerebral hemorrhage in hemisphere, cortical: Secondary | ICD-10-CM

## 2021-06-07 DIAGNOSIS — J690 Pneumonitis due to inhalation of food and vomit: Secondary | ICD-10-CM

## 2021-06-07 DIAGNOSIS — R1312 Dysphagia, oropharyngeal phase: Secondary | ICD-10-CM

## 2021-06-07 LAB — GLUCOSE, CAPILLARY
Glucose-Capillary: 107 mg/dL — ABNORMAL HIGH (ref 70–99)
Glucose-Capillary: 135 mg/dL — ABNORMAL HIGH (ref 70–99)
Glucose-Capillary: 159 mg/dL — ABNORMAL HIGH (ref 70–99)
Glucose-Capillary: 162 mg/dL — ABNORMAL HIGH (ref 70–99)
Glucose-Capillary: 186 mg/dL — ABNORMAL HIGH (ref 70–99)
Glucose-Capillary: 198 mg/dL — ABNORMAL HIGH (ref 70–99)

## 2021-06-07 LAB — BASIC METABOLIC PANEL
Anion gap: 9 (ref 5–15)
BUN: 14 mg/dL (ref 8–23)
CO2: 25 mmol/L (ref 22–32)
Calcium: 8.6 mg/dL — ABNORMAL LOW (ref 8.9–10.3)
Chloride: 100 mmol/L (ref 98–111)
Creatinine, Ser: 0.77 mg/dL (ref 0.61–1.24)
GFR, Estimated: >60 mL/min (ref >60)
Glucose, Bld: 165 mg/dL — ABNORMAL HIGH (ref 70–99)
Potassium: 4.2 mmol/L (ref 3.5–5.1)
Sodium: 134 mmol/L — ABNORMAL LOW (ref 135–145)

## 2021-06-07 LAB — CBC
HCT: 40.2 % (ref 39.0–52.0)
Hemoglobin: 12.9 g/dL — ABNORMAL LOW (ref 13.0–17.0)
MCH: 29.6 pg (ref 26.0–34.0)
MCHC: 32.1 g/dL (ref 30.0–36.0)
MCV: 92.2 fL (ref 80.0–100.0)
Platelets: 292 10*3/uL (ref 150–400)
RBC: 4.36 MIL/uL (ref 4.22–5.81)
RDW: 13.2 % (ref 11.5–15.5)
WBC: 8 10*3/uL (ref 4.0–10.5)
nRBC: 0 % (ref 0.0–0.2)

## 2021-06-07 LAB — HEPATITIS PANEL, ACUTE
HCV Ab: NONREACTIVE
Hep A IgM: NONREACTIVE
Hep B C IgM: NONREACTIVE
Hepatitis B Surface Ag: NONREACTIVE

## 2021-06-07 MED ORDER — PROSOURCE TF PO LIQD
45.0000 mL | Freq: Two times a day (BID) | ORAL | Status: DC
  Administered 2021-06-07 – 2021-06-15 (×16): 45 mL
  Filled 2021-06-07 (×16): qty 45

## 2021-06-07 MED ORDER — OSMOLITE 1.5 CAL PO LIQD
1000.0000 mL | ORAL | Status: DC
  Administered 2021-06-07 – 2021-06-09 (×4): 1000 mL
  Filled 2021-06-07 (×3): qty 1000

## 2021-06-07 NOTE — Care Management (Signed)
Inpatient Rehabilitation Center Individual Statement of Services  Patient Name:  Donald [name]  Date:  06/07/2021  Welcome to the Inpatient Rehabilitation Center.  Our goal is to provide you with an individualized program based on your diagnosis and situation, designed to meet your specific needs.  With this comprehensive rehabilitation program, you will be expected to participate in at least 3 hours of rehabilitation therapies Monday-Friday, with modified therapy programming on the weekends.  Your rehabilitation program will include the following services:  Physical Therapy (PT), Occupational Therapy (OT), Speech Therapy (ST), 24 hour per day rehabilitation nursing, Therapeutic Recreaction (TR), Psychology, Neuropsychology, Care Coordinator, Rehabilitation Medicine, Nutrition Services, Pharmacy Services, and Other  Weekly team conferences will be held on Tuesdays to discuss your progress.  Your Inpatient Rehabilitation Care Coordinator will talk with you frequently to get your input and to update you on team discussions.  Team conferences with you and your family in attendance may also be held.  Expected length of stay: 28-32 days    Overall anticipated outcome: Moderate Assistance  Depending on your progress and recovery, your program may change. Your Inpatient Rehabilitation Care Coordinator will coordinate services and will keep you informed of any changes. Your Inpatient Rehabilitation Care Coordinator's name and contact numbers are listed  below.  The following services may also be recommended but are not provided by the Inpatient Rehabilitation Center:  Driving Evaluations Home Health Rehabiltiation Services Outpatient Rehabilitation Services Vocational Rehabilitation   Arrangements will be made to provide these services after discharge if needed.  Arrangements include referral to agencies that provide these services.  Your insurance has been verified to be:  [facility] G[employer]  Your  primary doctor is:  Meredith Staggers  Pertinent information will be shared with your doctor and your insurance company.  Inpatient Rehabilitation Care Coordinator:  Susie Cassette [phone] or [phone]  Information discussed with and copy given to patient by: Gretchen Short, 06/07/2021, 9:09 AM

## 2021-06-07 NOTE — Progress Notes (Signed)
Speech Language Pathology Daily Session Note  Patient Details  Name: Donald [name] MRN: [id number] Date of Birth: Apr 04, 1956  Today's Date: 06/07/2021 SLP Individual Time: 1000-1045 SLP Individual Time Calculation (min): 45 min  Short Term Goals: Week 1: SLP Short Term Goal 1 (Week 1): Pt will consume therapeutic trials of ice chips with minimal overt s/s aspiration and mod A for use of swallow precautions SLP Short Term Goal 2 (Week 1): Patient will utiize visual aides to orient to time/date/place/situation with modA cues. SLP Short Term Goal 3 (Week 1): Pt will name common objects with 90% accuracy mod A cues SLP Short Term Goal 4 (Week 1): Pt will follow simple 1-2 step commands with 80% accuracy mod A cues SLP Short Term Goal 5 (Week 1): Pt will verbally communicate basic wants/needs in 2-3 words phrases with mod A SLP Short Term Goal 6 (Week 1): Pt will attend to therapeutic tasks for >5 minutes provided mod A cues  Skilled Therapeutic Interventions: Skilled treatment session focused on dysphagia and cognitive goals. Upon arrival, patient was awake while upright in the wheelchair. Patient with poor management of secretions resulting in a wet vocal quality and audible wet breathing. Patient unable to mobilize secretions despite Max A multimodal cues, therefore, RT consulted and utilized suction towards back of oral cavity to initiate a gag reflex which allowed some clearance of secretions for short spans of time. Patient demonstrated difficulty following commands related to his oral cavity such as "close your mouth" as patient tends to sit with an open oral cavity without initiation of a reflexive swallow present throughout session. However, with extra time and Max A multimodal cues, patient initiated a swallow X 4 throughout session but also reported he was "fearful" of swallowing. Both the SLP and wife provided support and SLP provided education. Patient was ~50% intelligible due to a low  vocal intensity and wet vocal quality with Max-Total A multimodal cues needed for orientation and recall of basic biographical information. Patient left upright in wheelchair with wife present. Continue with current plan of care.      Pain No/Denies Pain   Therapy/Group: Individual Therapy  Donald [name] 06/07/2021, 12:46 PM

## 2021-06-07 NOTE — Progress Notes (Signed)
Occupational Therapy Session Note  Patient Details  Name: Donald [name] MRN: [id number] Date of Birth: Mar 16, 1956  Today's Date: 06/07/2021 Session 1 OT Individual Time: 1747-1595 OT Individual Time Calculation (min): 56 min   Session 2 OT Individual Time: 3967-2897 OT Individual Time Calculation (min): 15 min    Short Term Goals: Week 1:  OT Short Term Goal 1 (Week 1): Pt will maintain static sitting balance with max A of 1 in prep for seated ADL. OT Short Term Goal 2 (Week 1): Pt will completed grooming task with mod A in supported sitting. OT Short Term Goal 3 (Week 1): Pt will don shirt with max A in supported sitting. OT Short Term Goal 4 (Week 1): Pt will maintain midline orientation in supported sitting with overall mod A.  Skilled Therapeutic Interventions/Progress Updates:  Session 1   Pt greeted semi-reclined in bed with nursing administering meds. Pt with head turned all the way R and drooling. OT used suction to help manage oral secretions. Pt noted to be incontinent of urine and soaked through chuck pad to bed sheets. Total A +2 for peri-care and brief change. OT tried to get pt to assist with washing between legs, but he was having difficulty attending to task and following commands. Pt with tone in L side and stated he had pain in his L hip, especially when rolling with total A +2 to change brief, chuck pad, and pull pants up over hips. Total A +2 to sit EOB. Worked on sitting balance with max A and very flexed posture. Total A+2 slideboard transfer over to TIS wc. Worked on midline orientation, visual scanning and L attention within UB bathing/dressing tasks, but still total A. Pt left tilted in TIS wc with alarm belt on, call bell in hand, and needs met.  Session 2 Pt greeted semi-reclined in bed asleep with spouse present. OT able to wake pt, but he kept his gaze to the R to his wife. OT provided total A to roll pt to the L. OT provided gentle massage and ROM to help  bring pt's head/neck and gaze to midline. OT educated spouse on hemi positioning. OT assisted with suctioning multiple times as well. Pt left in supported sidelying at 35 degrees with pillows under R arm, under head, and between legs. Pt too fatigued for further activity. Pt left with bed alarm on, call bell in reach, and needs met.   Therapy Documentation Precautions:  Precautions Precautions: Fall Precaution Comments: L hemiparesis, L inattention, pusher to the L, cognitive deficits, cortrak Restrictions Weight Bearing Restrictions: No General: General OT Amount of Missed Time: 15 Minutes  Pain: Denies pain    Therapy/Group: Individual Therapy  Valma Cava 06/07/2021, 3:28 PM

## 2021-06-07 NOTE — Progress Notes (Signed)
Inpatient Rehabilitation  Patient information reviewed and entered into eRehab system by Nishika Parkhurst Maghan Jessee, OTR/L.   Information including medical coding, functional ability and quality indicators will be reviewed and updated through discharge.    

## 2021-06-07 NOTE — IPOC Note (Addendum)
Overall Plan of Care [facility]) Patient Details Name: Donald [name] MRN: [id number] DOB: 08-07-1956  Admitting Diagnosis: Intraparenchymal hematoma of brain [facility])  Hospital Problems: Principal Problem:   Intraparenchymal hematoma of brain (HCC) Active Problems:   Transaminitis     Functional Problem List: Nursing Bladder, Bowel, Safety, Medication Management, Nutrition, Endurance  PT Balance, Behavior, Endurance, Motor, Nutrition, Perception, Safety, Skin Integrity  OT Balance, Cognition, Endurance, Motor, Nutrition, Perception, Pain, Safety, Sensory, Vision, Skin Integrity  SLP Behavior, Cognition, Perception, Safety, Nutrition, Linguistic, Endurance  TR         Basic ADL's: OT Grooming, Eating, Bathing, Dressing, Toileting     Advanced  ADL's: OT       Transfers: PT Bed Mobility, Bed to Chair, Car, Lobbyist, Technical brewer: PT Ambulation, Psychologist, prison and probation services, Stairs     Additional Impairments: OT    SLP Swallowing, Communication, Social Cognition comprehension, expression    TR      Anticipated Outcomes Item Anticipated Outcome  Self Feeding MIN depending on change in NPO status  Swallowing  Supervision A   Basic self-care  MOD  Toileting  MOD A bath; MAX toilet   Bathroom Transfers MOD  Bowel/Bladder  Manage bowel and bladder with min assist  Transfers  mod assis ttransfers to and fom bed  Locomotion  min-mod assist WC mobility.  Communication  Min A communication of wants/needs/thoughts, Min A comprehension  Cognition  Min A for basic cog  Pain  remain free of pain  Safety/Judgment  remain free of injury, prevent falls with cues and reminders   Therapy Plan: PT Intensity: Minimum of 1-2 x/day ,45 to 90 minutes PT Frequency: 5 out of 7 days PT Duration Estimated Length of Stay: 28-32 days OT Intensity: Minimum of 1-2 x/day, 45 to 90 minutes OT Frequency: 5 out of 7 days OT Duration/Estimated Length of Stay: 3.5 to 4  weeks SLP Intensity: Minumum of 1-2 x/day, 30 to 90 minutes SLP Frequency: 3 to 5 out of 7 days SLP Duration/Estimated Length of Stay: 3-3.5 weeks   Due to the current state of emergency, patients may not be receiving their 3-hours of Medicare-mandated therapy.   Team Interventions: Nursing Interventions Bladder Management, Bowel Management, Medication Management, Dysphagia/Aspiration Precaution Training, Discharge Planning, Psychosocial Support, Disease Management/Prevention, Cognitive Remediation/Compensation  PT interventions Ambulation/gait training, Balance/vestibular training, Cognitive remediation/compensation, DME/adaptive equipment instruction, Discharge planning, Community reintegration, Disease management/prevention, Functional electrical stimulation, Functional mobility training, Neuromuscular re-education, Pain management, Patient/family education, Psychosocial support, Skin care/wound management, Splinting/orthotics, Stair training, Therapeutic Activities, Therapeutic Exercise, Wheelchair propulsion/positioning, UE/LE Coordination activities, UE/LE Strength taining/ROM, Visual/perceptual remediation/compensation  OT Interventions Balance/vestibular training, Cognitive remediation/compensation, Community reintegration, Discharge planning, Disease mangement/prevention, DME/adaptive equipment instruction, Functional electrical stimulation, Functional mobility training, Neuromuscular re-education, Pain management, Patient/family education, Psychosocial support, Self Care/advanced ADL retraining, Skin care/wound managment, Splinting/orthotics, Therapeutic Activities, Therapeutic Exercise, UE/LE Strength taining/ROM, UE/LE Coordination activities, Visual/perceptual remediation/compensation, Wheelchair propulsion/positioning  SLP Interventions Cognitive remediation/compensation, Dysphagia/aspiration precaution training, Internal/external aids, Speech/Language facilitation, Environmental controls,  Cueing hierarchy, Functional tasks, Patient/family education, Therapeutic Exercise, Therapeutic Activities, Multimodal communication approach  TR Interventions    SW/CM Interventions Discharge Planning, Psychosocial Support, Patient/Family Education   Barriers to Discharge MD  Medical stability  Nursing Decreased caregiver support, Incontinence, Home environment access/layout 1 level, 4 steps, left rail, lives with spouse  PT Inaccessible home environment, Decreased caregiver support, Home environment access/layout, Incontinence, Lack of/limited family support, Insurance for SNF coverage, Medication compliance, Behavior, Nutrition means    OT  Inaccessible home environment, Decreased caregiver support, Home environment access/layout, Incontinence    SLP Nutrition means    SW       Team Discharge Planning: Destination: PT-Skilled Nursing Facility (SNF) ,OT- Home , SLP-Home Projected Follow-up: PT-Home health PT, Skilled nursing facility, OT-  Home health OT, Skilled nursing facility, SLP-Home Health SLP, 24 hour supervision/assistance Projected Equipment Needs: PT-To be determined, Wheelchair cushion (measurements), Wheelchair (measurements), OT- 3 in 1 bedside comode, To be determined, SLP-None recommended by SLP Equipment Details: PT- , OT-  Patient/family involved in discharge planning: PT-  ,  OT-Patient, SLP-Family member/caregiver, Patient  MD ELOS: 4 weeks Medical Rehab Prognosis:  Excellent Assessment: The patient has been admitted for CIR therapies with the diagnosis of Right ICH after fall. The team will be addressing functional mobility, strength, stamina, balance, safety, adaptive techniques and equipment, self-care, bowel and bladder mgt, patient and caregiver education, NMR, speech, swallowing, cognition, spasticity mgt. Goals have been set at [facility] assist with OT, min to mod assist with PT and min assist with cognition and communication.   Due to the current state of emergency,  patients may not be receiving their 3 hours per day of Medicare-mandated therapy.    Ranelle Oyster, MD, FAAPMR     See Team Conference Notes for weekly updates to the plan of care

## 2021-06-07 NOTE — Plan of Care (Signed)
  Problem: Consults Goal: RH STROKE PATIENT EDUCATION Description: See Patient Education module for education specifics  Outcome: Progressing Goal: Nutrition Consult-if indicated Outcome: Progressing   Problem: RH BOWEL ELIMINATION Goal: RH STG MANAGE BOWEL WITH ASSISTANCE Description: STG Manage Bowel with min Assistance. Outcome: Progressing   Problem: RH BLADDER ELIMINATION Goal: RH STG MANAGE BLADDER WITH ASSISTANCE Description: STG Manage Bladder With min Assistance Outcome: Progressing   Problem: RH SKIN INTEGRITY Goal: RH STG MAINTAIN SKIN INTEGRITY WITH ASSISTANCE Description: STG Maintain Skin Integrity With min Assistance. Outcome: Progressing   Problem: RH SAFETY Goal: RH STG ADHERE TO SAFETY PRECAUTIONS W/ASSISTANCE/DEVICE Description: STG Adhere to Safety Precautions With cues and reminders Outcome: Progressing   Problem: RH KNOWLEDGE DEFICIT Goal: RH STG INCREASE KNOWLEDGE OF HYPERTENSION Description: Pt and family will be able to demonstrate understanding of medication management, dietary and lifestyle modifications to better control blood pressure and prevent stroke using handouts and booklets provided with mod I assist.  Outcome: Progressing Goal: RH STG INCREASE KNOWLEGDE OF HYPERLIPIDEMIA Description: Pt and family will be able to demonstrate understanding of medication management, dietary and lifestyle modifications to better control cholesterol levels and prevent stroke using handouts and booklets provided with mod I assist.  Outcome: Progressing Goal: RH STG INCREASE KNOWLEDGE OF STROKE PROPHYLAXIS Description: Pt and family will be able to demonstrate understanding of medication management, dietary and lifestyle modifications to better control blood pressure and prevent stroke using handouts and booklets provided with mod I assist.  Outcome: Progressing   

## 2021-06-07 NOTE — Progress Notes (Addendum)
PROGRESS NOTE   Subjective/Complaints: Had a reasonable night. A little more fatigued appearing today. Still working to cough up secretions. Wife helps with cueing/physical suction  ROS: Limited due to cognitive/behavioral    Objective:   US Abdomen Limited RUQ (LIVER/GB)  Result Date: 06/06/2021 CLINICAL DATA:  Elevated LFTs EXAM: ULTRASOUND ABDOMEN LIMITED RIGHT UPPER QUADRANT COMPARISON:  None. FINDINGS: Gallbladder: No gallstones or wall thickening visualized. No sonographic Murphy sign noted by sonographer. Common bile duct: Diameter: 4 mm Liver: There is a cystic structure with septation in the anterior right hepatic lobe measuring 1.4 x 1.1 x 1.5 cm. Portal vein is patent on color Doppler imaging with normal direction of blood flow towards the liver. Other: None. IMPRESSION: 1. Septated cyst in the anterior right hepatic lobe measuring up to 1.5 cm. This could be more completely assessed with MRI or a follow-up ultrasound in 6 months. 2. No other abnormalities. Electronically Signed   By: Gerome Sam III M.D   On: 06/06/2021 20:43   Recent Labs    06/05/21 0518 06/07/21 0514  WBC 8.5 8.0  HGB 11.7* 12.9*  HCT 36.4* 40.2  PLT 269 292   Recent Labs    06/06/21 0604 06/07/21 0514  NA 134* 134*  K 4.7 4.2  CL 103 100  CO2 23 25  GLUCOSE 166* 165*  BUN 18 14  CREATININE 0.78 0.77  CALCIUM 8.7* 8.6*    Intake/Output Summary (Last 24 hours) at 06/07/2021 1147 Last data filed at 06/07/2021 0300 Gross per 24 hour  Intake 300 ml  Output --  Net 300 ml        Physical Exam: Vital Signs Blood pressure 126/75, pulse 93, temperature 98 F (36.7 C), temperature source Oral, resp. rate 16, height 5\' 5"  (1.651 m), weight 56.4 kg, SpO2 100 %. Constitutional: No distress . Vital signs reviewed. In w/c, eyes closed HEENT: EOMI, oral membranes moist, NGT Neck: supple Cardiovascular: RRR without murmur. No JVD     Respiratory/Chest: a lot of upper airway, pharyngeal sounds. Has hard moving secretions. Does not appear in distress.    GI/Abdomen: BS +, non-tender, non-distended Ext: no clubbing, cyanosis, or edema Psych: lethargic, cooperative when he arouses enough  Neurological:    Comments: poor oropharyngeal control. Left central 7. lethargic, follows basic commands. Left inattention. Dense left hemiparesis. Able to squeeze by hand with his right hand- strong grip- hand is cold. I    Assessment/Plan: 1. Functional deficits which require 3+ hours per day of interdisciplinary therapy in a comprehensive inpatient rehab setting. Physiatrist is providing close team supervision and 24 hour management of active medical problems listed below. Physiatrist and rehab team continue to assess barriers to discharge/monitor patient progress toward functional and medical goals  Care Tool:  Bathing        Body parts bathed by helper: Right arm     Bathing assist Assist Level: 2 Helpers     Upper Body Dressing/Undressing Upper body dressing   What is the patient wearing?: Pull over shirt    Upper body assist Assist Level: Dependent - Patient 0%    Lower Body Dressing/Undressing Lower body dressing  What is the patient wearing?: Hospital gown only     Lower body assist Assist for lower body dressing: Dependent - Patient 0%     Toileting Toileting    Toileting assist Assist for toileting: Dependent - Patient 0%     Transfers Chair/bed transfer  Transfers assist     Chair/bed transfer assist level: Dependent - mechanical lift     Locomotion Ambulation   Ambulation assist   Ambulation activity did not occur: Safety/medical concerns          Walk 10 feet activity   Assist  Walk 10 feet activity did not occur: Safety/medical concerns        Walk 50 feet activity   Assist Walk 50 feet with 2 turns activity did not occur: Safety/medical concerns         Walk  150 feet activity   Assist Walk 150 feet activity did not occur: Safety/medical concerns         Walk 10 feet on uneven surface  activity   Assist Walk 10 feet on uneven surfaces activity did not occur: Safety/medical concerns         Wheelchair     Assist Will patient use wheelchair at discharge?: Yes Type of Wheelchair: Manual    Wheelchair assist level: Dependent - Patient 0% Max wheelchair distance: 150    Wheelchair 50 feet with 2 turns activity    Assist        Assist Level: Dependent - Patient 0%   Wheelchair 150 feet activity     Assist      Assist Level: Dependent - Patient 0%   Blood pressure 126/75, pulse 93, temperature 98 F (36.7 C), temperature source Oral, resp. rate 16, height 5\' 5"  (1.651 m), weight 56.4 kg, SpO2 100 %.    Medical Problem List and Plan: 1.  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              -patient may shower             -ELOS/Goals: MinA 3-4weeks  -Continue CIR therapies including PT, OT, and SLP -reviewed all aspects of care with Mrs Penton today  2.  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 Wife prefers no oral anticoagulation given hematoma.  -DVT/anticoagulation:  Mechanical:  Antiembolism stockings, knee (TED hose) Bilateral lower extremities             -antiplatelet therapy: N/A due to concerns of CAA 3. Pain Management: N/A.  4. Mood/arousal:  has been more alert on amantadine  -consider ritalin trial also             -antipsychotic agents: N/A 5. Neuropsych: This patient is not fully capable of making decisions on his own behalf. 6. Skin/Wound Care: Routine pressure relief measures. 7. Fluids/Electrolytes/Nutrition: Monitor I/O--check lytes in am. 8. Recurrent aspiration event: On Unasyn Day #4-->transition to oral in 5 days?             --only low grade temp. Wbc's 8k             --CXR recently stable.  9. Leucocytosis:  resolved 10. Blood pressure: Monitor BID--controlled without meds at this time. 11. Progressive cognitive decline: To reschedule dementia work up at [facility] after d/c. 12. Hyperglycemia: Hgb A1C- 5.8 and BS mildly elevated due to tube feeds. Continue lantus 5 units daily for now.             --Monitor BS  every  4 hours and use SSI for elevated  BS  -fair control 13. Dysphagia: Continue NPO status with TF for nutritional support.             --oral care every 4 hours while awake. 14. Tachycardia: 90's - monitor HR TID 15. Elevated liver transaminases: Liver u/s with one small cystic structure.   -hep panel negative so far -recheck LFT's Tomorrow -may have been d/t abx/combo, tylenol has been stopped -Ferritin elevated but Hgb, Fe++ and TIBC all low which indicate anemia of chronic disease.      LOS: 3 days A FACE TO FACE EVALUATION WAS PERFORMED  Ranelle Oyster 06/07/2021, 11:47 AM

## 2021-06-07 NOTE — Progress Notes (Signed)
Initial Nutrition Assessment  DOCUMENTATION CODES:   Non-severe (moderate) malnutrition in context of chronic illness  INTERVENTION:  Increase Osmolite 1.5 cal formula via Cortrak NGT to new goal rate of 65 ml/hr x 20 hours (may hold TF for up to 4 hours for therapy)  Provide 45 ml Prosource TF BID per tube.   Free water flushes of 250 ml q 4 hours per tube. (MD to adjust as appropriate).  Tube feeding regimen provides 2030 kcal, 104 grams of protein, and 2488 ml free water.   NUTRITION DIAGNOSIS:   Moderate Malnutrition related to chronic illness as evidenced by mild fat depletion, moderate muscle depletion.  GOAL:   Patient will meet greater than or equal to 90% of their needs  MONITOR:   TF tolerance, Skin, Weight trends, Labs, I & O's, Diet advancement  REASON FOR ASSESSMENT:   New TF    ASSESSMENT:   64 year old right-handed male with history of OA and and progressive dementia work-up who was originally admitted on 05/11/2021 with acute left hemiparesis with sensory deficits, right gaze preference and left facial droop due to 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 CT of head done 6/12 revealing interval progression of right frontal hematoma with worsening of mass-effect and midline shift. CT brain repeated showing truncated arterial cortical branch at upper and lateral hematoma. He continues to be limited by left sided weakness with right gaze preference and inability to cross midline, cognitive deficits with delay in processing and motor planning deficits, left hemiplegia with emerging tone affecting mobility and ADLs CIR recommended due to functional decline  Pt continues NPO status. Pt asleep during time of visit and did not awaken to RD visit. Family at bedside reports pt has been tolerating hs tube feeds well with no difficulties. Noted pt with a 18% weight loss over the past 1 month. RD to continue to monitor wt closely. RD to modify tube feeds  orders to allow for feeds to infuse over 20 hours so that TF may be held for up to 4 hours for therapy.   NUTRITION - FOCUSED PHYSICAL EXAM:  Flowsheet Row Most Recent Value  Orbital Region Mild depletion  Upper Arm Region Mild depletion  Thoracic and Lumbar Region Unable to assess  Buccal Region Moderate depletion  Temple Region Moderate depletion  Clavicle Bone Region Moderate depletion  Clavicle and Acromion Bone Region Moderate depletion  Scapular Bone Region Unable to assess  Dorsal Hand Unable to assess  Patellar Region Moderate depletion  Anterior Thigh Region Moderate depletion  Posterior Calf Region Moderate depletion  Edema (RD Assessment) None  Hair Reviewed  Eyes Reviewed  Mouth Reviewed  Skin Reviewed  Nails Reviewed      Labs and medications reviewed.   Diet Order:   Diet Order             Diet NPO time specified  Diet effective now                   EDUCATION NEEDS:   Not appropriate for education at this time  Skin:  Skin Assessment: Skin Integrity Issues: Skin Integrity Issues:: Other (Comment) Other: puncture R neck  Last BM:  6/25  Height:   Ht Readings from Last 1 Encounters:  06/04/21 5\' 5"  (1.651 m)    Weight:   Wt Readings from Last 1 Encounters:  06/07/21 56.4 kg    Ideal Body Weight:  61.8 kg  BMI:  Body mass index is 20.69 kg/m.  Estimated Nutritional  Needs:   Kcal:  1850-2050  Protein:  90-110 grams  Fluid:  >/= 1.9 L/day  Roslyn Smiling, MS, RD, LDN RD pager number/after hours weekend pager number on Amion.

## 2021-06-07 NOTE — Progress Notes (Signed)
Physical Therapy Session Note  Patient Details  Name: Donald [name] MRN: [id number] Date of Birth: 09-Apr-1956  Today's Date: 06/07/2021 PT Individual Time: 1300-1400 PT Individual Time Calculation (min): 60 min   Short Term Goals: Week 1:  PT Short Term Goal 1 (Week 1): Pt will perform bed mobiltiy with max assist  of 1. PT Short Term Goal 2 (Week 1): Pt will tolerate sitting in WC >2 hours PT Short Term Goal 3 (Week 1): Pt will maintain sitting balance EOB with max assist up to 2 minutes PT Short Term Goal 4 (Week 1): Pt will transfer to [facility] with max assist of 2 consistently Week 2:     Skilled Therapeutic Interventions/Progress Updates:   Pain:  Pt reports L flank pain.  Treatment to tolerance.  Rest breaks and repositioning as needed.  Core elongation to address tightness.  Pt initially oob in wc,  large puddle of urine under wc and visible driipping of urine from base of wc indicative of urinary incontinence.  Pt transferred to bed via hoyer lift and NT addressed perineal care and brief change while therapist addressed cleaning of wc, wc cushion, cushion cover which had to be laundered.  PT tech reports pt has been oob since 9:00am and nursing was notified of need to return to bed following ST for rest break.  Nursing notified and discussed the following: Pt unable to tolerate extended periods of time oob in wc due to limited tolerance for upright, fatigue. 2. Need for timed toileting to prevent episodes of extended exposture to moisture/urinary incontinence, skin breakdown risk.  Session continued w/pt working on rolling in bed w/total assist, hand over hand assist, max cues and assist to flex LLE, max cues to flex RLE, max tactile cues to initiate bridging and total assist for donning shorts. Supine to L side to sit w/total assist, hand over hand assist to sequence LE and UE movements, tracks therapist w/rolling, assists w/RUE attempting to pull up on therapist to sitting. Once  upright, pt worked on placing of trunk by therapist at midline, pt briefly maintining, mod to max assist for balance. Pt engaged in visually scanning, turning head to, and reaching for brightly colored blocks placed at midline and placing them in bin just to L of midline w/gradual progression crossing midline w/inconsistent ability to scan/rotate cspine and fails w/progression of more than 20degrees past midline.  Therapist used tactile, verbal, auditory , visual cues to promote attention to L and at times hand over hand assist needed. Pt became fatigued, falling asleep.  Therefore, returned to supine.  Rolled L/R total assist as above for repositioning.  When lying flat c/o L flank pain which resolves w/slight elevation of head and foot of bed but may be due to develeoping tightness in trunk due to increased L  Pt left supine w/rails up x 4 alarm set, bed in lowest position, and needs in reach. Therapy Documentation Precautions:  Precautions Precautions: Fall Precaution Comments: L hemiparesis, L inattention, pusher to the L, cognitive deficits, cortrak Restrictions Weight Bearing Restrictions: No   Therapy/Group: Individual Therapy Donald [name], PT   Shearon Balo 06/07/2021, 3:46 PM

## 2021-06-07 NOTE — Discharge Instructions (Addendum)
Inpatient Rehab Discharge Instructions  Donald [name] Discharge date and time: 07/12/21   Activities/Precautions/ Functional Status: Activity: no lifting, driving, or strenuous exercise for till cleared by MD Diet:  As tolerated Wound Care: keep wound clean and dry   Functional status:  ___ No restrictions     ___ Walk up steps independently __X_ 24/7 supervision/assistance   ___ Walk up steps with assistance ___ Intermittent supervision/assistance  ___ Bathe/dress independently ___ Walk with walker     __X_ Bathe/dress with assistance ___ Walk Independently    ___ Shower independently ___ Walk with assistance    ___ Shower with assistance _X__ No alcohol     ___ Return to work/school ________   COMMUNITY REFERRALS UPON DISCHARGE:    Home Health:   PT     OT     ST    RN    SNA    SW                   Agency:      [facility] [employer]      Phone: Office #[phone]; liaison- Elnita Maxwell #[phone] *please expect follow-up within 2-3 days of discharge to schedule your home visit. If you have not received follow-up, be sure to contact the liaison [place] first, AND then branch directly*    Medical Equipment/Items Ordered: wheelchair, drop arm bedside commode *[facility] WILL CALL WHEN THE SPECIALTY WHEELCHAIR IS READY. BE SURE TO SWAP OUT THE CURRENT WHEELCHAIR FOR THE SPECIALTY BY NOTIFYING THE OFFICE SO YOU ARE NOT DOUBLE CHARGED*                                                 Agency/Supplier: [facility] [phone]  Special Instructions: Boost every 20 minutes when in chair.   STROKE/TIA DISCHARGE INSTRUCTIONS SMOKING Cigarette smoking nearly doubles your risk of having a stroke & is the single most alterable risk factor  If you smoke or have smoked in the last 12 months, you are advised to quit smoking for your health. Most of the excess cardiovascular risk related to smoking disappears within a year of stopping. Ask you doctor about anti-smoking  medications [place] Quit Line: 1-800-QUIT NOW Free Smoking Cessation Classes [phone]  CHOLESTEROL Know your levels; limit fat & cholesterol in your diet  Lipid Panel     Component Value Date/Time   CHOL 232 (H) 05/11/2021 1118   CHOL 204 (H) 09/11/2020 1449   TRIG 73 05/11/2021 1118   HDL 77 05/11/2021 1118   HDL 63 09/11/2020 1449   CHOLHDL 3.0 05/11/2021 1118   VLDL 15 05/11/2021 1118   LDLCALC 140 (H) 05/11/2021 1118   LDLCALC 132 (H) 09/11/2020 1449   LDLCALC 107 (H) 04/29/2020 0826     Many patients benefit from treatment even if their cholesterol is at goal. Goal: Total Cholesterol (CHOL) less than 160 Goal:  Triglycerides (TRIG) less than 150 Goal:  HDL greater than 40 Goal:  LDL (LDLCALC) less than 100   BLOOD PRESSURE American Stroke Association blood pressure target is less that 120/80 mm/Hg  Your discharge blood pressure is:  BP: 114/82 Monitor your blood pressure Limit your salt and alcohol intake Many individuals will require more than one medication for high blood pressure  DIABETES (A1c is a blood sugar average for last 3 months) Goal HGBA1c is under 7% (  HBGA1c is blood sugar average for last 3 months)  Diabetes:     Lab Results  Component Value Date   HGBA1C 5.8 (H) 05/11/2021    Your HGBA1c can be lowered with medications, healthy diet, and exercise. Check your blood sugar as directed by your physician Call your physician if you experience unexplained or low blood sugars.  PHYSICAL ACTIVITY/REHABILITATION Goal is 30 minutes at least 4 days per week  Activity: Increase activity slowly, and No driving, Therapies:  Return to work:  Activity decreases your risk of heart attack and stroke and makes your heart stronger.  It helps control your weight and blood pressure; helps you relax and can improve your mood. Participate in a regular exercise program. Talk with your doctor about the best form of exercise for you (dancing, walking, swimming, cycling).   DIET/WEIGHT Goal is to maintain a healthy weight  Your discharge diet is:  Diet Order             DIET DYS 2 Room service appropriate? Yes; Fluid consistency: Thin  Diet effective now                   liquids Your height is:  Height: 5\' 5"  (165.1 cm) Your current weight is: Weight: 62.7 kg Your Body Mass Index (BMI) is:  BMI (Calculated): 21.53 Following the type of diet specifically designed for you will help prevent another stroke. You are at goal weight   Your goal Body Mass Index (BMI) is 19-24. Healthy food habits can help reduce 3 risk factors for stroke:  High cholesterol, hypertension, and excess weight.  RESOURCES Stroke/Support Group:  Call [phone]   STROKE EDUCATION PROVIDED/REVIEWED AND GIVEN TO PATIENT Stroke warning signs and symptoms How to activate emergency medical system (call 911). Medications prescribed at discharge. Need for follow-up after discharge. Personal risk factors for stroke. Pneumonia vaccine given:  Flu vaccine given: My questions have been answered, the writing is legible, and I understand these instructions.  I will adhere to these goals & educational materials that have been provided to me after my discharge from the hospital.      My questions have been answered and I understand these instructions. I will adhere to these goals and the provided educational materials after my discharge from the hospital.  Patient/Caregiver Signature _______________________________ Date __________  Clinician Signature _______________________________________ Date __________  Please bring this form and your medication list with you to all your follow-up doctor's appointments.

## 2021-06-08 LAB — GLUCOSE, CAPILLARY
Glucose-Capillary: 128 mg/dL — ABNORMAL HIGH (ref 70–99)
Glucose-Capillary: 140 mg/dL — ABNORMAL HIGH (ref 70–99)
Glucose-Capillary: 153 mg/dL — ABNORMAL HIGH (ref 70–99)
Glucose-Capillary: 163 mg/dL — ABNORMAL HIGH (ref 70–99)
Glucose-Capillary: 166 mg/dL — ABNORMAL HIGH (ref 70–99)
Glucose-Capillary: 187 mg/dL — ABNORMAL HIGH (ref 70–99)

## 2021-06-08 MED ORDER — SODIUM CHLORIDE 0.9 % IV SOLN: INTRAVENOUS | Status: DC | PRN

## 2021-06-08 NOTE — Progress Notes (Signed)
Speech Language Pathology Daily Session Note  Patient Details  Name: Donald [name] MRN: [id number] Date of Birth: 1956/10/15  Today's Date: 06/08/2021 SLP Individual Time: 0800-0830 SLP Individual Time Calculation (min): 30 min  Short Term Goals: Week 1: SLP Short Term Goal 1 (Week 1): Pt will consume therapeutic trials of ice chips with minimal overt s/s aspiration and mod A for use of swallow precautions SLP Short Term Goal 2 (Week 1): Patient will utiize visual aides to orient to time/date/place/situation with modA cues. SLP Short Term Goal 3 (Week 1): Pt will name common objects with 90% accuracy mod A cues SLP Short Term Goal 4 (Week 1): Pt will follow simple 1-2 step commands with 80% accuracy mod A cues SLP Short Term Goal 5 (Week 1): Pt will verbally communicate basic wants/needs in 2-3 words phrases with mod A SLP Short Term Goal 6 (Week 1): Pt will attend to therapeutic tasks for >5 minutes provided mod A cues  Skilled Therapeutic Interventions: Skilled ST intervention performed with focus on dysphagia and cognition. Patient was sleeping on arrival and presented with wet vocal quality and audible wet breathing upon entering secondary to poor management of secretions. HOB was elevated and patient was cued to attempt to cough and throat clear. Patient exhibited weak and ineffective throat clearing and cough. MD was present during session and aware of wet breath sounds. Quality/strength of cough improved after patient was asked to verbally communicate, which slightly improved vocal quality. SLP facilitated oral care with total A. Speech was minimally intelligible (<25%) at word level due to wet vocal quality and low intensity. Patient was oriented to place, and required max A verbal cues for all other orientation and biographical information. Patient was left in bed with HOB elevated, alarm activated, and needs within reach. Continue per ST POC.    Pain Pain Assessment Pain Scale:  0-10 Pain Score: 0-No pain  Therapy/Group: Individual Therapy  Tamala Ser 06/08/2021, 8:33 AM

## 2021-06-08 NOTE — Patient Care Conference (Signed)
Inpatient RehabilitationTeam Conference and Plan of Care Update Date: 06/08/2021   Time: 10:36 AM    Patient Name: Donald [name]      Medical Record Number: [id number]  Date of Birth: 07/11/1956 Sex: Male         Room/Bed: 4W05C/4W05C-01 Payor Info: Payor: Advertising copywriter / Plan: GEHA / Product Type: *No Product type* /    Admit Date/Time:  06/04/2021  4:18 PM  Primary Diagnosis:  Intraparenchymal hematoma of brain [facility])  Hospital Problems: Principal Problem:   Intraparenchymal hematoma of brain (HCC) Active Problems:   Transaminitis    Expected Discharge Date: Expected Discharge Date:  (4 weeks)  Team Members Present: Physician leading conference: Dr. Faith Rogue Care Coodinator Present: Cecile Sheerer, LCSWA;Dannielle Baskins Marlyne Beards, RN, BSN, CRRN Nurse Present: Kennyth Arnold, RN PT Present: Malachi Pro, PT OT Present: Towanda Malkin, OT SLP Present: Feliberto Gottron, SLP PPS Coordinator present : Fae Pippin, SLP     Current Status/Progress Goal Weekly Team Focus  Bowel/Bladder   Patient is incontinent of B&B  Regain continence  Scheduled toileting   Swallow/Nutrition/ Hydration   NPO with NG tube  Min A  trials of ice, management of secretions, cough strength   ADL's   Total A +2  Min/mod A  OOB tolerance, L NMR, sitting balance, activity tolerance, transfers, self-care retraining   Mobility   +2 assist for bed mobility, dependent otherwise  minA to modA  increased activity, OOB tolerance, L NMR, balance transfers   Communication   Max A due to wet vocal quality and low vocal intensity  Min A  self-monitoring and correcting of wet vocal quality   Safety/Cognition/ Behavioral Observations  Max A  Min A  sustained attention, attention to midline   Pain   Patient not complaining of pain  Patient will remain pain free  Assess patient's pain more frequently and prn   Skin   Patient has intact skin  Patient will maintain good skin integrity  Reposition q2 and as  needed     Discharge Planning:  D/c to home with his wife and support from their children.   Team Discussion: Left femoral DVT, Aspirated and now on IV antibiotics, most likely will need a PEG. Grossly incontinent of urine, incontinent of bowel. No complaints of pain, incision to right neck, and groin. Has cortrak in place, still has lots of secretions, encouraging to cough and has suction available. Has good family support. Patient on target to meet rehab goals: Was able to use washcloth to assist with some peri care. Still dependent for ADL's. +2 for bed mobility, dependent for all care. NPO , minimal swallows, sounds like he is drowning in his own secretions. Therapy requesting he be put on the night bath list by nursing.   *See Care Plan and progress notes for long and short-term goals.   Revisions to Treatment Plan:  MD adjusting medications, assessing labs.  Teaching Needs: Family education, medication management, skin/wound care, secretion management, transfer training, balance training, endurance training, safety awareness.  Current Barriers to Discharge: Decreased caregiver support, Medical stability, Home enviroment access/layout, IV antibiotics, Incontinence, Lack of/limited family support, Medication compliance, Behavior, and Nutritional means  Possible Resolutions to Barriers: Continue current medications, provide emotional support.     Medical Summary Current Status: dense left hemiparesis. significant dysphagia d/t right ICH. aspiration pneumonia--unasyn. remains NPO and aspiration risk.  Barriers to Discharge: Medical stability   Possible Resolutions to [employer] Focus: daily assessment of labs, radiological findings, nutritional status. rx PNA,  increase stimulant   Continued Need for Acute Rehabilitation Level of Care: The patient requires daily medical management by a physician with specialized training in physical medicine and rehabilitation for the following  reasons: Direction of a multidisciplinary physical rehabilitation program to maximize functional independence : Yes Medical management of patient stability for increased activity during participation in an intensive rehabilitation regime.: Yes Analysis of laboratory values and/or radiology reports with any subsequent need for medication adjustment and/or medical intervention. : Yes   I attest that I was present, lead the team conference, and concur with the assessment and plan of the team.   Tennis Must 06/08/2021, 4:26 PM

## 2021-06-08 NOTE — Progress Notes (Signed)
Physical Therapy Session Note  Patient Details  Name: Donald [name] MRN: [id number] Date of Birth: 05/14/1956  Today's Date: 06/08/2021 PT Individual Time: 1301-1405 PT Individual Time Calculation (min): 64 min   Short Term Goals: Week 1:  PT Short Term Goal 1 (Week 1): Pt will perform bed mobiltiy with max assist  of 1. PT Short Term Goal 2 (Week 1): Pt will tolerate sitting in WC >2 hours PT Short Term Goal 3 (Week 1): Pt will maintain sitting balance EOB with max assist up to 2 minutes PT Short Term Goal 4 (Week 1): Pt will transfer to [facility] with max assist of 2 consistently  Skilled Therapeutic Interventions/Progress Updates:     Pt received supine in bed. Brief noted to be soiled so session initiates with pt performing bilateral rolling to assist with pericare. Pt requires maxA and verbal and tactile cues to complete. Pt brief soiled with urine and feces. RN notified. Following brief change, pt performs supine to sit with maxA. Pt requires modA initially to maintain static sitting balance at EOB, with PT assisting with placement of R upper extremity to allow increased BOS and stability. Pt performs squat pivot transfer to the R with maxA and cues for body mechanics and sequencing. WC transport to gym for time management. PT trainee performs squat pivot from WC to mat table with maxA +1 and minA +2 for safety. Pt participates in static sitting balance activity at edge of mat, with both feet positioned flat on floor and upper extremities place on knees. Mirror positioned for visual feedback. Pt able to maintain static balance for 15-20 seconds at a time but then tends to lose balance to the L and either forward or backward. PT sits on exercise ball behind pt and provides manual stretch of pt's anterior chest musculature to allow for improved posture and chest wall motion for improved respirations. Pt has difficulty maintaining upright gaze, instead tending to focus gaze downward and to the R, also  keeping eyes closed for much of session. PT utilizes multimodal cueing in attempt to have pt attend more to the L and maintain open eyes. Pt performs minimal reaching tasks with R upper extremity but has difficulty with sustained attention to task. Squat pivot transfer from mat>WC>bed with maxA. Sit to supine totalA. Left with alarm intact and all needs within reach.  Therapy Documentation Precautions:  Precautions Precautions: Fall Precaution Comments: L hemiparesis, L inattention, pusher to the L, cognitive deficits, cortrak Restrictions Weight Bearing Restrictions: No    Therapy/Group: Individual Therapy  Beau Fanny, PT, DPT 06/08/2021, 2:49 PM

## 2021-06-08 NOTE — Plan of Care (Signed)
  Problem: Consults Goal: RH STROKE PATIENT EDUCATION Description: See Patient Education module for education specifics  Outcome: Progressing Goal: Nutrition Consult-if indicated Outcome: Progressing   Problem: RH BOWEL ELIMINATION Goal: RH STG MANAGE BOWEL WITH ASSISTANCE Description: STG Manage Bowel with min Assistance. Outcome: Progressing   Problem: RH BLADDER ELIMINATION Goal: RH STG MANAGE BLADDER WITH ASSISTANCE Description: STG Manage Bladder With min Assistance Outcome: Progressing   Problem: RH SKIN INTEGRITY Goal: RH STG MAINTAIN SKIN INTEGRITY WITH ASSISTANCE Description: STG Maintain Skin Integrity With min Assistance. Outcome: Progressing   Problem: RH SAFETY Goal: RH STG ADHERE TO SAFETY PRECAUTIONS W/ASSISTANCE/DEVICE Description: STG Adhere to Safety Precautions With cues and reminders Outcome: Progressing   Problem: RH KNOWLEDGE DEFICIT Goal: RH STG INCREASE KNOWLEDGE OF HYPERTENSION Description: Pt and family will be able to demonstrate understanding of medication management, dietary and lifestyle modifications to better control blood pressure and prevent stroke using handouts and booklets provided with mod I assist.  Outcome: Progressing Goal: RH STG INCREASE KNOWLEGDE OF HYPERLIPIDEMIA Description: Pt and family will be able to demonstrate understanding of medication management, dietary and lifestyle modifications to better control cholesterol levels and prevent stroke using handouts and booklets provided with mod I assist.  Outcome: Progressing Goal: RH STG INCREASE KNOWLEDGE OF STROKE PROPHYLAXIS Description: Pt and family will be able to demonstrate understanding of medication management, dietary and lifestyle modifications to better control blood pressure and prevent stroke using handouts and booklets provided with mod I assist.  Outcome: Progressing   

## 2021-06-08 NOTE — Progress Notes (Signed)
Occupational Therapy Session Note  Patient Details  Name: Donald [name] MRN: [id number] Date of Birth: 1956-10-02  Today's Date: 06/08/2021 OT Individual Time: 9381-0175 OT Individual Time Calculation (min): 37 min    Short Term Goals: Week 1:  OT Short Term Goal 1 (Week 1): Pt will maintain static sitting balance with max A of 1 in prep for seated ADL. OT Short Term Goal 2 (Week 1): Pt will completed grooming task with mod A in supported sitting. OT Short Term Goal 3 (Week 1): Pt will don shirt with max A in supported sitting. OT Short Term Goal 4 (Week 1): Pt will maintain midline orientation in supported sitting with overall mod A.  Skilled Therapeutic Interventions/Progress Updates:    Patient in bed, alert and attempts to answer questions - limited by difficulty swallowing his secretions.  Wife present.  Patient requests to work on neck and posture.  Patient's brief and bed wet with urine.  Dependent for rolling in bed and changing brief and linens, donning pants.  He attempts to help with hygiene with right hand and pulling up pants onto thighs.  Side lying to sitting edge of bed max /dep.  He requires max A to achieve upright posture and head to midline.  Attempted deep breathing and coughing in upright seated position - he was able to have productive cough with change of position from sitting to supine.  He remained in bed at close of session, HOB>30, call bell in reach and bed alarm set.    Therapy Documentation Precautions:  Precautions Precautions: Fall Precaution Comments: L hemiparesis, L inattention, pusher to the L, cognitive deficits, cortrak Restrictions Weight Bearing Restrictions: No   Therapy/Group: Individual Therapy  Barrie Lyme 06/08/2021, 7:26 AM

## 2021-06-08 NOTE — Progress Notes (Signed)
PROGRESS NOTE   Subjective/Complaints: SLP in room. Pt reports no problems overnight but still struggles to manage secretions.   ROS: Limited due to cognitive/behavioral    Objective:   US Abdomen Limited RUQ (LIVER/GB)  Result Date: 06/06/2021 CLINICAL DATA:  Elevated LFTs EXAM: ULTRASOUND ABDOMEN LIMITED RIGHT UPPER QUADRANT COMPARISON:  None. FINDINGS: Gallbladder: No gallstones or wall thickening visualized. No sonographic Murphy sign noted by sonographer. Common bile duct: Diameter: 4 mm Liver: There is a cystic structure with septation in the anterior right hepatic lobe measuring 1.4 x 1.1 x 1.5 cm. Portal vein is patent on color Doppler imaging with normal direction of blood flow towards the liver. Other: None. IMPRESSION: 1. Septated cyst in the anterior right hepatic lobe measuring up to 1.5 cm. This could be more completely assessed with MRI or a follow-up ultrasound in 6 months. 2. No other abnormalities. Electronically Signed   By: Gerome Sam III M.D   On: 06/06/2021 20:43   Recent Labs    06/07/21 0514  WBC 8.0  HGB 12.9*  HCT 40.2  PLT 292   Recent Labs    06/06/21 0604 06/07/21 0514  NA 134* 134*  K 4.7 4.2  CL 103 100  CO2 23 25  GLUCOSE 166* 165*  BUN 18 14  CREATININE 0.78 0.77  CALCIUM 8.7* 8.6*   No intake or output data in the 24 hours ending 06/08/21 1226       Physical Exam: Vital Signs Blood pressure 120/83, pulse 100, temperature 98 F (36.7 C), temperature source Oral, resp. rate 16, height 5\' 5"  (1.651 m), weight 65.4 kg, SpO2 100 %. Constitutional: No distress . Vital signs reviewed. HEENT: EOMI, oral membranes moist, decreased oromotor control Neck: supple Cardiovascular: RRR without murmur. No JVD    Respiratory/Chest: wet upper airway sounds, fairly clear at bases but not great effort. Struggles to cough up secretions    GI/Abdomen: BS +, non-tender, non-distended Ext: no  clubbing, cyanosis, or edema Psych: fairly alert this morning. Cooperative. Neurological:    Comments: poor oropharyngeal control. Left central 7. lethargic, follows basic commands. Left inattention. Dense left hemiparesis ongoing. Able to squeeze my right hand. Moves right leg spontaneously.    Assessment/Plan: 1. Functional deficits which require 3+ hours per day of interdisciplinary therapy in a comprehensive inpatient rehab setting. Physiatrist is providing close team supervision and 24 hour management of active medical problems listed below. Physiatrist and rehab team continue to assess barriers to discharge/monitor patient progress toward functional and medical goals  Care Tool:  Bathing        Body parts bathed by helper: Right arm     Bathing assist Assist Level: 2 Helpers     Upper Body Dressing/Undressing Upper body dressing   What is the patient wearing?: Pull over shirt    Upper body assist Assist Level: Dependent - Patient 0%    Lower Body Dressing/Undressing Lower body dressing      What is the patient wearing?: Hospital gown only     Lower body assist Assist for lower body dressing: Dependent - Patient 0%     Toileting Toileting    Toileting assist Assist for toileting:  Dependent - Patient 0%     Transfers Chair/bed transfer  Transfers assist     Chair/bed transfer assist level: Dependent - mechanical lift     Locomotion Ambulation   Ambulation assist   Ambulation activity did not occur: Safety/medical concerns          Walk 10 feet activity   Assist  Walk 10 feet activity did not occur: Safety/medical concerns        Walk 50 feet activity   Assist Walk 50 feet with 2 turns activity did not occur: Safety/medical concerns         Walk 150 feet activity   Assist Walk 150 feet activity did not occur: Safety/medical concerns         Walk 10 feet on uneven surface  activity   Assist Walk 10 feet on uneven  surfaces activity did not occur: Safety/medical concerns         Wheelchair     Assist Will patient use wheelchair at discharge?: Yes Type of Wheelchair: Manual    Wheelchair assist level: Dependent - Patient 0% Max wheelchair distance: 150    Wheelchair 50 feet with 2 turns activity    Assist        Assist Level: Dependent - Patient 0%   Wheelchair 150 feet activity     Assist      Assist Level: Dependent - Patient 0%   Blood pressure 120/83, pulse 100, temperature 98 F (36.7 C), temperature source Oral, resp. rate 16, height 5\' 5"  (1.651 m), weight 65.4 kg, SpO2 100 %.    Medical Problem List and Plan: 1.  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              -patient may shower             -ELOS/Goals: MinA 3-4weeks  -Continue CIR therapies including PT, OT, and SLP -team conf today 2.  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 Wife prefers no oral anticoagulation given hematoma.  -DVT/anticoagulation:  Mechanical:  Antiembolism stockings, knee (TED hose) Bilateral lower extremities             -antiplatelet therapy: N/A due to concerns of CAA 3. Pain Management: N/A.  4. Mood/arousal:  has been more alert on amantadine  -begin ritalin trial 5mg  bid             -antipsychotic agents: N/A 5. Neuropsych: This patient is not fully capable of making decisions on his own behalf. 6. Skin/Wound Care: Routine pressure relief measures. 7. Fluids/Electrolytes/Nutrition: Monitor I/O--check lytes in am. 8. Recurrent aspiration event: On Unasyn Day #5--continue for now             --only low grade temp. Wbc's 8k             --CXR recently stable.  9. Leucocytosis: resolved 10. Blood pressure: Monitor BID--controlled without meds at this time. 11. Progressive cognitive decline: To reschedule dementia work up at [facility] after d/c. 12. Hyperglycemia: Hgb A1C- 5.8 and BS mildly elevated due to tube  feeds. Continue lantus 5 units daily for now.             --Monitor BS every  4 hours and use SSI for elevated  BS  -fair control 13. Dysphagia: Continue NPO status with TF for nutritional support.             --oral care every 4 hours while awake. 14. Tachycardia: 90's -  monitor HR TID 15. Elevated liver transaminases: Liver u/s with one small cystic structure.   -hep panel negative   -LFT sample hemolyzed---nobody contacted me. Will re-order for 6/29 -may have been d/t abx/combo, tylenol has been stopped -Ferritin elevated but Hgb, Fe++ and TIBC all low which point towards anemia of chronic disease.      LOS: 4 days A FACE TO FACE EVALUATION WAS PERFORMED  Ranelle Oyster 06/08/2021, 12:26 PM

## 2021-06-08 NOTE — Progress Notes (Signed)
Occupational Therapy Session Note  Patient Details  Name: Donald [name] MRN: [id number] Date of Birth: 1956/08/17  Today's Date: 06/08/2021 OT Individual Time: 1100-1200 OT Individual Time Calculation (min): 60 min    Short Term Goals: Week 1:  OT Short Term Goal 1 (Week 1): Pt will maintain static sitting balance with max A of 1 in prep for seated ADL. OT Short Term Goal 2 (Week 1): Pt will completed grooming task with mod A in supported sitting. OT Short Term Goal 3 (Week 1): Pt will don shirt with max A in supported sitting. OT Short Term Goal 4 (Week 1): Pt will maintain midline orientation in supported sitting with overall mod A.  Skilled Therapeutic Interventions/Progress Updates:    Pt received in room in bed with wife at bedside and consented to OT tx. Pt presents with R side gaze and required max cuing to visually scan to look at therapist standing on L side. Pt assisted to sit EOB with max A, with max A and cuing to maintain upright position when seated. Pt's wife providing constant cues for pt to follow commands, therapist educated wife on sensory overload and need for increased time to process instruction. Pt instructed to wash wash with wash rag ad suction mouth as well as trying to cough as pt demo's garbled speech throughout session. Pt instructed to pick up items placed on R side and hand to therapist sitting on L side. Pt has difficulty crossing midline but is able to progress to reach to L side with multimodal cuing and time to increase attention to L side. Pt cued throughout session for upright posture with poor carryover. Educated pt and spouse about shoulder subluxation and proper support when seated and in bed. Kineseo-taped pt's L shoulder for support. After tx, pt left in bed with HOB at >30*, bed alarm on, and all needs met.   Therapy Documentation Precautions:  Precautions Precautions: Fall Precaution Comments: L hemiparesis, L inattention, pusher to the L, cognitive  deficits, cortrak Restrictions Weight Bearing Restrictions: No  Vital Signs: Therapy Vitals Temp: 98 F (36.7 C) Temp Source: Oral Pulse Rate: 100 Resp: 16 BP: 120/83 Patient Position (if appropriate): Lying Oxygen Therapy SpO2: 100 % O2 Device: Room Air Pain: 0/10      Therapy/Group: Individual Therapy  Enza Shone 06/08/2021, 7:54 AM

## 2021-06-08 NOTE — Progress Notes (Signed)
Patient ID: Donald [name], male   DOB: [birth year], 65 y.o.   MRN: [id number]  SW met with pt and pt wife in room to provide updates from team conference, and ELOS 4 weeks. SW to continue to provide updates after team conference.   Loralee Pacas, MSW, [place] Office: [phone] Cell: [phone] Fax: [phone]

## 2021-06-09 LAB — GLUCOSE, CAPILLARY
Glucose-Capillary: 136 mg/dL — ABNORMAL HIGH (ref 70–99)
Glucose-Capillary: 178 mg/dL — ABNORMAL HIGH (ref 70–99)
Glucose-Capillary: 181 mg/dL — ABNORMAL HIGH (ref 70–99)
Glucose-Capillary: 191 mg/dL — ABNORMAL HIGH (ref 70–99)
Glucose-Capillary: 213 mg/dL — ABNORMAL HIGH (ref 70–99)
Glucose-Capillary: 97 mg/dL (ref 70–99)

## 2021-06-09 LAB — CBC
HCT: 37.2 % — ABNORMAL LOW (ref 39.0–52.0)
Hemoglobin: 11.9 g/dL — ABNORMAL LOW (ref 13.0–17.0)
MCH: 29.4 pg (ref 26.0–34.0)
MCHC: 32 g/dL (ref 30.0–36.0)
MCV: 91.9 fL (ref 80.0–100.0)
Platelets: 372 10*3/uL (ref 150–400)
RBC: 4.05 MIL/uL — ABNORMAL LOW (ref 4.22–5.81)
RDW: 13.1 % (ref 11.5–15.5)
WBC: 8 10*3/uL (ref 4.0–10.5)
nRBC: 0 % (ref 0.0–0.2)

## 2021-06-09 LAB — HEPATIC FUNCTION PANEL
ALT: 146 U/L — ABNORMAL HIGH (ref 0–44)
AST: 55 U/L — ABNORMAL HIGH (ref 15–41)
Albumin: 2.2 g/dL — ABNORMAL LOW (ref 3.5–5.0)
Alkaline Phosphatase: 167 U/L — ABNORMAL HIGH (ref 38–126)
Bilirubin, Direct: 0.1 mg/dL (ref 0.0–0.2)
Indirect Bilirubin: 0.4 mg/dL (ref 0.3–0.9)
Total Bilirubin: 0.5 mg/dL (ref 0.3–1.2)
Total Protein: 6.6 g/dL (ref 6.5–8.1)

## 2021-06-09 NOTE — Progress Notes (Signed)
Occupational Therapy Session Note  Patient Details  Name: Donald [name] MRN: [id number] Date of Birth: 1956/05/23  Today's Date: 06/09/2021 OT Individual Time: 1302-1405 OT Individual Time Calculation (min): 63 min    Short Term Goals: Week 1:  OT Short Term Goal 1 (Week 1): Pt will maintain static sitting balance with max A of 1 in prep for seated ADL. OT Short Term Goal 2 (Week 1): Pt will completed grooming task with mod A in supported sitting. OT Short Term Goal 3 (Week 1): Pt will don shirt with max A in supported sitting. OT Short Term Goal 4 (Week 1): Pt will maintain midline orientation in supported sitting with overall mod A.  Skilled Therapeutic Interventions/Progress Updates:    Treatment session with focus on trunk control, sitting balance, midline orientation, and transfers.  Pt received in TIS w/c reclined back with eyes closed.  Pt reports "it's cold" but agreeable to therapy session.  RN present in room reconnecting tube feed.  Completed squat pivot transfer to R max-total assist with +2 due to pt pushing to L with RUE.  Therapist repositioned RUE and utilized Bobath technique to facilitate increased anterior weight shift and decrease pushing.  Engaged in trunk control, sitting balance, and midline orientation while sitting edge of mat with therapist placed behind pt and mirror in front.  Therapist providing facilitation for upright and midline posture, providing facilitation for improved alignment of neck due to torticollis to increase attention to midline.  Pt keeping eyes closed frequently, however able to open eyes to sound and with balance perturbations. Therapist providing hand under hand to facilitate increased reach while moving items from R to midline and back and then forward to remove from mirror.  Pt requiring multimodal cues for initiation and sustained attention to task.  Pt able to maintain sitting balance for ~5 seconds when fully upright after forward reaching task.   Completed squat pivot transfer mat > w/c > bed to L to decrease pushing, requiring max assist +2.  Pt returned to semi-reclined and left >30* due to NG tube and left with pillow under LUE for improved positioning and all needs in reach on R.  Therapy Documentation Precautions:  Precautions Precautions: Fall Precaution Comments: L hemiparesis, L inattention, pusher to the L, cognitive deficits, cortrak Restrictions Weight Bearing Restrictions: No  Pain:  Pt with no c/o pain   Therapy/Group: Individual Therapy  Rosalio Loud 06/09/2021, 3:12 PM

## 2021-06-09 NOTE — Progress Notes (Signed)
Physical Therapy Session Note  Patient Details  Name: Donald [name] MRN: [id number] Date of Birth: [birth year]  Today's Date: 06/09/2021 PT Individual Time: 4098-1191 PT Individual Time Calculation (min): 70 min   Short Term Goals: Week 1:  PT Short Term Goal 1 (Week 1): Pt will perform bed mobiltiy with max assist  of 1. PT Short Term Goal 2 (Week 1): Pt will tolerate sitting in WC >2 hours PT Short Term Goal 3 (Week 1): Pt will maintain sitting balance EOB with max assist up to 2 minutes PT Short Term Goal 4 (Week 1): Pt will transfer to [facility] with max assist of 2 consistently Week 2:    Week 3:     Skilled Therapeutic Interventions/Progress Updates:   Pain:  Pt reports L lowback and side pain.  See below for soft tissue mobilization and stretching. Treatment to tolerance.  Rest breaks and repositioning as needed.  Pt requested nursing ensure pt in clean/dry brief prior to session, NT handled this to prevent delay in session.  Pt initially supine, eyes closed but does respond verbally to therapist but speech is mumbling quality.  Pt rolls to L w/max assist, multimodal cues.  C/o pain L side/LLE w/transition to sitting, significant tightness noted, pain relieved when upright.  Noted pt shirt wet from prior incontinence episode.  NT in to assist therapist/patient w/changing shirt which requires max assist for balance, total assist for changing garment.  Worked on scooting hips to work on diaasociation pelvis/trunk. Pt leans to L w/total assist for board placement.  Total assist bed to wc.  Once in wc pt repositioned w/total assist, cues   Once in wc, pt transported to gym for continued session. Pt total assist of 2 for placement of harness, tends to lean heavily to L when placed in full upright of wc. Pt brought to standing in standing frame using mechanical lift feature. And max assist for management of Ues/trunk.  Pt initially c/o L flank and low back pain in heavily flexed posture.  Pt  provided manual overpressure for improved extension from behind pt w/focus on hips and shoulders to assist. Therapist also performed soft tissue mobilization of L paraspinals, lower attachment of lats and traps w/much improved posture at hips, decreased pain. Therapist then worked on upper trunk exension w/manual posterior shoulder girdle rotation using forearms to retract and depress scapula.  Therapist then provided manual cervical stretching to promote upright gaze and cervical rotation to L.  Once pt achieved more upright posture, pt engaged in reaching for brightly colored beanbags placed on his LUE, identified color of beanbag, then tossed in bin to L.  Pt able to scan 75% directly to L and maintain briefly during task.  Repeated 8X.   Pt required seated rest in semi tilted position then repeated above this time w/upright posture more readily achieved. Pt then scanned to L to find bin and remove beanbags w/R hand.   At end of activity, pt stated "I am going to need a rest break immediately after this"  Pt repositioned in wc, transported to room, brief checked and pt remained dry. Pt left oob in wc w/alarm belt set and needs in reach.  Wife at bedside andinformed of activities performed during session.    Therapy Documentation Precautions:  Precautions Precautions: Fall Precaution Comments: L hemiparesis, L inattention, pusher to the L, cognitive deficits, cortrak Restrictions Weight Bearing Restrictions: No   Therapy/Group: Individual Therapy Donald [name], PT   Donald [name] 06/09/2021, 12:35 PM

## 2021-06-09 NOTE — Progress Notes (Signed)
Occupational Therapy Session Note  Patient Details  Name: Donald [name] MRN: [id number] Date of Birth: 1956-07-10  Today's Date: 06/09/2021 OT Individual Time: 2202-5427 OT Individual Time Calculation (min): 30 min    Short Term Goals: Week 1:  OT Short Term Goal 1 (Week 1): Pt will maintain static sitting balance with max A of 1 in prep for seated ADL. OT Short Term Goal 2 (Week 1): Pt will completed grooming task with mod A in supported sitting. OT Short Term Goal 3 (Week 1): Pt will don shirt with max A in supported sitting. OT Short Term Goal 4 (Week 1): Pt will maintain midline orientation in supported sitting with overall mod A.  Skilled Therapeutic Interventions/Progress Updates:    Pt received in room in bed and consented to OT tx. Pt req max A to sit EOB, initially was able to maintain midline but as session progressed, continues to lean and push to L side. Pt instructed in sitting balance tasks and reaching across midline to pick up objects and reach fwd for anterior weight shift to improve core strength and trunk control. Pt requires increased time for processing instruction, encouraged to speak loudly and slowly in order for therapist to understand him. After tx, pt laid back down in bed comfortably and left with HOB at 30*, wife at bedside and all needs met.    Therapy Documentation Precautions:  Precautions Precautions: Fall Precaution Comments: L hemiparesis, L inattention, pusher to the L, cognitive deficits, cortrak Restrictions Weight Bearing Restrictions: No Vital Signs: Therapy Vitals Temp: 98.1 F (36.7 C) Temp Source: Oral Pulse Rate: 97 Resp: 18 BP: (!) 143/99 Patient Position (if appropriate): Lying Oxygen Therapy SpO2: 100 % O2 Device: Room Air Pain: none     Therapy/Group: Individual Therapy  [name] 06/09/2021, 7:56 AM

## 2021-06-09 NOTE — Progress Notes (Signed)
Speech Language Pathology Daily Session Note  Patient Details  Name: Donald [name] MRN: [id number] Date of Birth: 1956-10-15  Today's Date: 06/09/2021 SLP Individual Time: 1435-1500 SLP Individual Time Calculation (min): 25 min  Short Term Goals: Week 1: SLP Short Term Goal 1 (Week 1): Pt will consume therapeutic trials of ice chips with minimal overt s/s aspiration and mod A for use of swallow precautions SLP Short Term Goal 2 (Week 1): Patient will utiize visual aides to orient to time/date/place/situation with modA cues. SLP Short Term Goal 3 (Week 1): Pt will name common objects with 90% accuracy mod A cues SLP Short Term Goal 4 (Week 1): Pt will follow simple 1-2 step commands with 80% accuracy mod A cues SLP Short Term Goal 5 (Week 1): Pt will verbally communicate basic wants/needs in 2-3 words phrases with mod A SLP Short Term Goal 6 (Week 1): Pt will attend to therapeutic tasks for >5 minutes provided mod A cues  Skilled Therapeutic Interventions: Skilled treatment session focused on speech and dysphagia goals. Upon arrival, patient was awake in bed with a large amount of dry skin around his lips that was essentially acting as "glue." SLP provided thorough oral and lip care via the suction toothbrush. SLP also educated wife on how to provide appropriate oral care and spoke to LPN regarding QD oral care. Patient with a wet vocal quality and attempted to clear with Max A multimodal cues to cough without success. Suspect patient may benefit from RMT with focus on cough strength, therefore, SLP provided the patient with a flutter valve to practice appropriate lip seal and ability to follow directions. With mod verbal and tactile cues, patient able to utilize the flutter valve appropriately. SLP may introduce EMST next session. Patient's intelligibility continues to be impacted by an increased speech rate and wet vocal quality. However, with Mod verbal cues patient can utilize a slow rate.  Patient also able to produce spontaneous swallows X 12 today to assist in clearing secretions which is excellent progress compared to last session of 3 swallows. Patient left upright in bed with alarm on and all needs within reach. Continue with current plan of care.      Pain No/Denies Pain   Therapy/Group: Individual Therapy  [name] 06/09/2021, 3:36 PM

## 2021-06-09 NOTE — Progress Notes (Signed)
PROGRESS NOTE   Subjective/Complaints: Slept last night. No new problems this morning. Pt appears comfortable  ROS: limited by speech/cognition   Objective:   No results found. Recent Labs    06/07/21 0514 06/09/21 0504  WBC 8.0 8.0  HGB 12.9* 11.9*  HCT 40.2 37.2*  PLT 292 372   Recent Labs    06/07/21 0514  NA 134*  K 4.2  CL 100  CO2 25  GLUCOSE 165*  BUN 14  CREATININE 0.77  CALCIUM 8.6*    Intake/Output Summary (Last 24 hours) at 06/09/2021 0831 Last data filed at 06/09/2021 0500 Gross per 24 hour  Intake 7971.14 ml  Output --  Net 7971.14 ml         Physical Exam: Vital Signs Blood pressure (!) 143/99, pulse 97, temperature 98.1 F (36.7 C), temperature source Oral, resp. rate 18, height 5\' 5"  (1.651 m), weight 62.7 kg, SpO2 100 %. Constitutional: No distress . Vital signs reviewed. HEENT: EOMI, oral membranes moist Neck: supple Cardiovascular: RRR without murmur. No JVD    Respiratory/Chest: wet voice, upper airway sounds   GI/Abdomen: BS +, non-tender, non-distended Ext: no clubbing, cyanosis, or edema Psych: cooperative Neurological:    Comments: poor oropharyngeal control. Left central 7. lethargic, follows basic commands. Left inattention. Dense left hemiparesis ongoing. Able to squeeze my right hand. Moves right leg spontaneously.    Assessment/Plan: 1. Functional deficits which require 3+ hours per day of interdisciplinary therapy in a comprehensive inpatient rehab setting. Physiatrist is providing close team supervision and 24 hour management of active medical problems listed below. Physiatrist and rehab team continue to assess barriers to discharge/monitor patient progress toward functional and medical goals  Care Tool:  Bathing        Body parts bathed by helper: Right arm     Bathing assist Assist Level: 2 Helpers     Upper Body Dressing/Undressing Upper body dressing    What is the patient wearing?: Pull over shirt    Upper body assist Assist Level: Dependent - Patient 0%    Lower Body Dressing/Undressing Lower body dressing      What is the patient wearing?: Pants, Incontinence brief     Lower body assist Assist for lower body dressing: Dependent - Patient 0%     Toileting Toileting    Toileting assist Assist for toileting: Dependent - Patient 0%     Transfers Chair/bed transfer  Transfers assist     Chair/bed transfer assist level: Dependent - mechanical lift     Locomotion Ambulation   Ambulation assist   Ambulation activity did not occur: Safety/medical concerns          Walk 10 feet activity   Assist  Walk 10 feet activity did not occur: Safety/medical concerns        Walk 50 feet activity   Assist Walk 50 feet with 2 turns activity did not occur: Safety/medical concerns         Walk 150 feet activity   Assist Walk 150 feet activity did not occur: Safety/medical concerns         Walk 10 feet on uneven surface  activity   Assist Walk 10  feet on uneven surfaces activity did not occur: Safety/medical concerns         Wheelchair     Assist Will patient use wheelchair at discharge?: Yes Type of Wheelchair: Manual    Wheelchair assist level: Dependent - Patient 0% Max wheelchair distance: 150    Wheelchair 50 feet with 2 turns activity    Assist        Assist Level: Dependent - Patient 0%   Wheelchair 150 feet activity     Assist      Assist Level: Dependent - Patient 0%   Blood pressure (!) 143/99, pulse 97, temperature 98.1 F (36.7 C), temperature source Oral, resp. rate 18, height 5\' 5"  (1.651 m), weight 62.7 kg, SpO2 100 %.    Medical Problem List and Plan: 1.  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              -patient may shower             -ELOS/Goals: MinA 3-4weeks  -Continue CIR therapies  including PT, OT, and SLP   2.  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 Wife prefers no oral anticoagulation given hematoma.  -DVT/anticoagulation:  Mechanical:  Antiembolism stockings, knee (TED hose) Bilateral lower extremities             -antiplatelet therapy: N/A due to concerns of CAA 3. Pain Management: N/A.  4. Mood/arousal:  has been more alert on amantadine  -begin ritalin trial 5mg  bid             -antipsychotic agents: N/A 5. Neuropsych: This patient is not fully capable of making decisions on his own behalf. 6. Skin/Wound Care: Routine pressure relief measures. 7. Fluids/Electrolytes/Nutrition: Monitor I/O--check lytes in am. 8. Recurrent aspiration event: On Unasyn Day #6--continue for 7 days at least             --only low grade temp. Wbc's 8k             --CXR recently stable.   -high aspiration risk 9. Leucocytosis: resolved 10. Blood pressure: Monitor BID--controlled without meds at this time. 11. Progressive cognitive decline: To reschedule dementia work up at [facility] after d/c. 12. Hyperglycemia: Hgb A1C- 5.8 and BS mildly elevated due to tube feeds. Continue lantus 5 units daily for now.             --Monitor BS every  4 hours and use SSI for elevated  BS  -increase lantus to 8u daily 13. Dysphagia: Continue NPO status with TF for nutritional support.             --oral care every 4 hours while awake.  -he will likely need PEG 14. Tachycardia: 90's - monitor HR TID 15. Elevated liver transaminases: Liver u/s with one small cystic structure.   -hep panel negative   -LFT's demonstrate ipmrovement 6/29---recheck next week -may have been d/t abx/combo, tylenol which have been stopped -Ferritin elevated but Hgb, Fe++ and TIBC all low which point towards anemia of chronic disease.      LOS: 5 days A FACE TO FACE EVALUATION WAS PERFORMED  06/09/2021, 8:31 AM

## 2021-06-09 NOTE — Progress Notes (Signed)
Inpatient Rehabilitation Care Coordinator Assessment and Plan Patient Details  Name: Arav Bannister MRN: [id number] Date of Birth: [birth year]  Today's Date: 06/09/2021  Hospital Problems: Principal Problem:   Intraparenchymal hematoma of brain ([place]) Active Problems:   Transaminitis  Past Medical History:  Past Medical History:  Diagnosis Date   Arthritis    Past Surgical History:  Past Surgical History:  Procedure Laterality Date   APPENDECTOMY     IR ANGIO INTRA EXTRACRAN SEL COM CAROTID INNOMINATE BILAT MOD SED  05/28/2021   IR ANGIO VERTEBRAL SEL VERTEBRAL BILAT MOD SED  05/28/2021   IR IVC FILTER PLMT / S&I /IMG GUID/MOD SED  05/27/2021   IR US GUIDE VASC ACCESS RIGHT  05/27/2021   Social History:  reports that he has never smoked. He has never used smokeless tobacco. He reports that he does not drink alcohol and does not use drugs.  Family / Support Systems Marital Status: Married How Long?: 35 years Patient Roles: Spouse, Parent Spouse/Significant Other: [name] (wife): [phone] Children: four adult chidren: [place], [place], [place], and [place]. Other Supports: None reproted Anticipated Caregiver: Wife and children PRN Ability/Limitations of Caregiver: Pt wife reports she works from home but able to assist. Caregiver Availability: 24/7 Family Dynamics: Pt lives with his wife.  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 Some confusion present.) Physical Abuse: Denies Verbal Abuse: Denies Sexual Abuse: Denies Exploitation of patient/patient's resources: Denies Self-Neglect: Denies  Emotional Status Pt's affect, behavior and adjustment status: Pt appeared to be in good spirits at time of visit Recent Psychosocial Issues: Pt wife denies Psychiatric History: Pt wife denies Substance Abuse History: Pt wife denies  Patient / Family Perceptions, Expectations & Goals Pt/Family understanding of illness & functional limitations: Pt family have general understanding of care needs Premorbid pt/family roles/activities: Independent with ADLs/IADLs Anticipated changes in roles/activities/participation: Assistance with ADLs/IADLs Pt/family expectations/goals: Patient's wife goal is to be as independent as possible, atleast 75% of how he was prior to admission.  Community Resources Express Scripts: None Premorbid Home Care/DME Agencies: None Transportation available at discharge: Wife Resource referrals recommended: Neuropsychology  Discharge Planning Living Arrangements: Spouse/significant other Support Systems: Spouse/significant other, Children, Other relatives Type of Residence: Private residence Financial Resources: Other (Comment) Loss adjuster, chartered) Financial Screen Referred: No Living Expenses: Mortgage Money Management: Spouse Does the patient have any problems obtaining your medications?: No Home Management: Wife reports she primary performs most of home needs Patient/Family Preliminary Plans: No changes Care Coordinator Anticipated Follow Up Needs: HH/OP  Clinical Impression SW met with pt and pt wife in room to complete assessment. Pt wife spoke primarly for pt. Pt is not a English as a second language teacher. However, pt wife is a English as a second language teacher but does not use any VA benefits. No HCPOA. No DME.   Rana Snare 06/09/2021, 7:44 PM

## 2021-06-09 NOTE — Plan of Care (Signed)
  Problem: RH Balance Goal: LTG Patient will maintain dynamic sitting balance (PT) Description: LTG:  Patient will maintain dynamic sitting balance with assistance during mobility activities (PT) Flowsheets (Taken 06/05/2021 0818) LTG: Pt will maintain dynamic sitting balance during mobility activities with:: Minimal Assistance - Patient > 75%   Problem: Sit to Stand Goal: LTG:  Patient will perform sit to stand with assistance level (PT) Description: LTG:  Patient will perform sit to stand with assistance level (PT) Flowsheets (Taken 06/09/2021 0818) LTG: PT will perform sit to stand in preparation for functional mobility with assistance level: Moderate Assistance - Patient 50 - 74%   Problem: RH Bed Mobility Goal: LTG Patient will perform bed mobility with assist (PT) Description: LTG: Patient will perform bed mobility with assistance, with/without cues (PT). Flowsheets (Taken 06/05/2021 0818) LTG: Pt will perform bed mobility with assistance level of: Moderate Assistance - Patient 50 - 74%   Problem: RH Bed to Chair Transfers Goal: LTG Patient will perform bed/chair transfers w/assist (PT) Description: LTG: Patient will perform bed to chair transfers with assistance (PT). Flowsheets (Taken 06/05/2021 0818) LTG: Pt will perform Bed to Chair Transfers with assistance level: Moderate Assistance - Patient 50 - 74%   Problem: RH Ambulation Goal: LTG Patient will ambulate in controlled environment (PT) Description: LTG: Patient will ambulate in a controlled environment, # of feet with assistance (PT). Flowsheets (Taken 06/09/2021 0818) LTG: Pt will ambulate in controlled environ  assist needed:: Maximal Assistance - Patient 25 - 49% LTG: Ambulation distance in controlled environment: 1ft with PT only to force use of LLE   Problem: RH Wheelchair Mobility Goal: LTG Patient will propel w/c in controlled environment (PT) Description: LTG: Patient will propel wheelchair in controlled  environment, # of feet with assist (PT) Flowsheets (Taken 06/09/2021 0818) LTG: Pt will propel w/c in controlled environ  assist needed:: Minimal Assistance - Patient > 75% LTG: Propel w/c distance in controlled environment: 30ft with hemi [employer]

## 2021-06-10 LAB — GLUCOSE, CAPILLARY
Glucose-Capillary: 100 mg/dL — ABNORMAL HIGH (ref 70–99)
Glucose-Capillary: 125 mg/dL — ABNORMAL HIGH (ref 70–99)
Glucose-Capillary: 155 mg/dL — ABNORMAL HIGH (ref 70–99)
Glucose-Capillary: 157 mg/dL — ABNORMAL HIGH (ref 70–99)
Glucose-Capillary: 89 mg/dL (ref 70–99)
Glucose-Capillary: 89 mg/dL (ref 70–99)

## 2021-06-10 MED ORDER — INSULIN GLARGINE 100 UNIT/ML ~~LOC~~ SOLN
8.0000 [IU] | Freq: Every day | SUBCUTANEOUS | Status: DC
  Administered 2021-06-11 – 2021-06-17 (×7): 8 [IU] via SUBCUTANEOUS
  Filled 2021-06-10 (×8): qty 0.08

## 2021-06-10 MED ORDER — METHYLPHENIDATE HCL 5 MG PO TABS
5.0000 mg | ORAL_TABLET | Freq: Two times a day (BID) | ORAL | Status: DC
  Administered 2021-06-10 – 2021-06-18 (×17): 5 mg via NASOGASTRIC
  Filled 2021-06-10 (×17): qty 1

## 2021-06-10 MED ORDER — SODIUM CHLORIDE 0.9 % IV SOLN
3.0000 g | Freq: Four times a day (QID) | INTRAVENOUS | Status: AC
  Administered 2021-06-10: 3 g via INTRAVENOUS
  Filled 2021-06-10: qty 8

## 2021-06-10 NOTE — Progress Notes (Signed)
Orthopedic Tech Progress Note Patient Details:  Donald [name] 1956-09-13 [id number] Resting Hand Splint has been ordered  Patient ID: [name], male   DOB: [birth year], 65 y.o.   MRN: [id number]  Smitty Pluck 06/10/2021, 3:13 PM

## 2021-06-10 NOTE — Progress Notes (Signed)
Speech Language Pathology Daily Session Note  Patient Details  Name: Donald [name] MRN: [id number] Date of Birth: 12/03/1956  Today's Date: 06/10/2021 SLP Individual Time: 1045-1130 SLP Individual Time Calculation (min): 45 min  Short Term Goals: Week 1: SLP Short Term Goal 1 (Week 1): Pt will consume therapeutic trials of ice chips with minimal overt s/s aspiration and mod A for use of swallow precautions SLP Short Term Goal 2 (Week 1): Patient will utiize visual aides to orient to time/date/place/situation with modA cues. SLP Short Term Goal 3 (Week 1): Pt will name common objects with 90% accuracy mod A cues SLP Short Term Goal 4 (Week 1): Pt will follow simple 1-2 step commands with 80% accuracy mod A cues SLP Short Term Goal 5 (Week 1): Pt will verbally communicate basic wants/needs in 2-3 words phrases with mod A SLP Short Term Goal 6 (Week 1): Pt will attend to therapeutic tasks for >5 minutes provided mod A cues  Skilled Therapeutic Interventions: Skilled treatment session focused on dysphagia and speech goals. Upon arrival, patient was alert and with a clear vocal quality! Patient initially declined swallowing his saliva but with encouragement was able to swallow his saliva consistently throughout session with extra time and Min verbal cues. After thorough oral care by SLP, patient consumed trials of ice chips. Patient demonstrated what appeared to be a delayed swallow initiation but no overt s/s of aspiration noted. Recommend MBS tomorrow to assess function. Due to patient's clear vocal quality, but demonstrated improved overall intelligibility but continues to require Mod A verbal cues for use of a decrease in speech rate. Patient appeared brighter today and was able to turn his head and eye past midline X 1. Patient left upright in wheelchair with alarm on and wife present. Continue with current plan of care.      Pain No/Denies Pain   Therapy/Group: Individual Therapy  Judene Logue,  Tijah Hane 06/10/2021, 3:15 PM

## 2021-06-10 NOTE — Progress Notes (Signed)
Occupational Therapy Session Note  Patient Details  Name: Donald [name] MRN: [id number] Date of Birth: October 28, 1956  Today's Date: 06/10/2021 OT Individual Time: 9030-0923 OT Individual Time Calculation (min): 55 min    Short Term Goals: Week 1:  OT Short Term Goal 1 (Week 1): Pt will maintain static sitting balance with max A of 1 in prep for seated ADL. OT Short Term Goal 2 (Week 1): Pt will completed grooming task with mod A in supported sitting. OT Short Term Goal 3 (Week 1): Pt will don shirt with max A in supported sitting. OT Short Term Goal 4 (Week 1): Pt will maintain midline orientation in supported sitting with overall mod A.   Skilled Therapeutic Interventions/Progress Updates:    Pt greeted at time of session semireclined in bed resting, RN performing med pass via NG tube. Sit up at EOB total A of 1, performing seated UB ADL for bathing/removing shirt with total A. Pt with sigificant L lean, pushing with RUE. With extended time able to sit for brief periods with CGA when using RUE for tasks but mostly Mod/Max for sitting balance EOB. Flexed posture noted as well with Max A to improve, unable to fully come up into upright sitting. Focus on using RUE for functional tasks reaching for shirt, wash cloth, etc and using LUE as support on bed surface with Max A. Donned new shirt with hemitechnique total A/2 helpers. Donned pants bed level dependent with 2 helpers to get over hips. Supine > sit 2 helpers and slide board bed > chair total A of 2 with bobath method. Up in chair positioned with pillows to decrease lean and elevate LUE. Alarm on and TIS reclined. Call bell in reach.    Therapy Documentation Precautions:  Precautions Precautions: Fall Precaution Comments: L hemiparesis, L inattention, pusher to the L, cognitive deficits, cortrak Restrictions Weight Bearing Restrictions: No    Therapy/Group: Individual Therapy  Erasmo Score 06/10/2021, 7:16 AM

## 2021-06-10 NOTE — Progress Notes (Signed)
Pt stated he has abdomen pain 9/10. He was bladder scanned and cath. We got 400 back. Pt said his pain level decreased after he was cath. Jesusita Oka was notified and there are no new orders. I Will continue to monitor.

## 2021-06-10 NOTE — Progress Notes (Signed)
Nutrition Follow-up  DOCUMENTATION CODES:   Non-severe (moderate) malnutrition in context of chronic illness  INTERVENTION:  Continue Osmolite 1.5 cal formula via Cortrak NGT at goal rate of 65 ml/hr x 20 hours (may hold TF for up to 4 hours for therapy)   Provide 45 ml Prosource TF BID per tube.   Free water flushes of 250 ml q 4 hours per tube. (MD to adjust as appropriate).   Tube feeding regimen provides 2030 kcal, 104 grams of protein, and 2488 ml free water.    NUTRITION DIAGNOSIS:   Moderate Malnutrition related to chronic illness as evidenced by mild fat depletion, moderate muscle depletion; ongoing  GOAL:   Patient will meet greater than or equal to 90% of their needs; met with TF  MONITOR:   TF tolerance, Skin, Weight trends, Labs, I & O's, Diet advancement  REASON FOR ASSESSMENT:   New TF    ASSESSMENT:   65 year old right-handed male with history of OA and and progressive dementia work-up who was originally admitted on 05/11/2021 with acute left hemiparesis with sensory deficits, right gaze preference and left facial droop due to 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 CT of head done 6/12 revealing interval progression of right frontal hematoma with worsening of mass-effect and midline shift. CT brain repeated showing truncated arterial cortical branch at upper and lateral hematoma. He continues to be limited by left sided weakness with right gaze preference and inability to cross midline, cognitive deficits with delay in processing and motor planning deficits, left hemiplegia with emerging tone affecting mobility and ADLs CIR recommended due to functional decline  Pt continues high aspiration risk, NPO status. Per MD, likely need PEG placement. Pt has been tolerating his tube feeds well. Plans to continue current tube feeding orders. Labs and medications reviewed.   Diet Order:   Diet Order             Diet NPO time specified  Diet effective  now                   EDUCATION NEEDS:   Not appropriate for education at this time  Skin:  Skin Assessment: Reviewed RN Assessment Skin Integrity Issues:: Other (Comment) Other: puncture R neck  Last BM:  6/30  Height:   Ht Readings from Last 1 Encounters:  06/04/21 $RemoveB'5\' 5"'[id number]$  (1.651 m)    Weight:   Wt Readings from Last 1 Encounters:  06/10/21 63 kg    Ideal Body Weight:  61.8 kg  BMI:  Body mass index is 23.11 kg/m.  Estimated Nutritional Needs:   Kcal:  1850-2050  Protein:  90-110 grams  Fluid:  >/= 1.9 L/day  Corrin Parker, MS, RD, LDN RD pager number/after hours weekend pager number on Amion.

## 2021-06-10 NOTE — Progress Notes (Addendum)
PROGRESS NOTE   Subjective/Complaints: Up with OT at bedside washing up. No problems overnight reported.   ROS: Limited due to cognitive/behavioral    Objective:   No results found. Recent Labs    06/09/21 0504  WBC 8.0  HGB 11.9*  HCT 37.2*  PLT 372   No results for input(s): NA, K, CL, CO2, GLUCOSE, BUN, CREATININE, CALCIUM in the last 72 hours.   Intake/Output Summary (Last 24 hours) at 06/10/2021 1025 Last data filed at 06/10/2021 0605 Gross per 24 hour  Intake 432.86 ml  Output 400 ml  Net 32.86 ml         Physical Exam: Vital Signs Blood pressure 126/78, pulse 97, temperature 99.6 F (37.6 C), temperature source Oral, resp. rate 18, height 5\' 5"  (1.651 m), weight 63 kg, SpO2 100 %. Constitutional: No distress . Vital signs reviewed. HEENT: EOMI, oral membranes moist, NGT Neck: supple Cardiovascular: RRR without murmur. No JVD    Respiratory/Chest: CTA Bilaterally without wheezes or rales. Normal effort    GI/Abdomen: BS +, non-tender, non-distended Ext: no clubbing, cyanosis, or edema Psych: flat but cooperates Neurological:    Comments: poor oropharyngeal control. Dysarthric, dysphonic. Left central 7. More alert.  follows basic commands. Left inattention. Dense left hemiparesis ongoing. Able to squeeze my right hand. Moves right leg spontaneously. Needs assistance with sitting balance   Assessment/Plan: 1. Functional deficits which require 3+ hours per day of interdisciplinary therapy in a comprehensive inpatient rehab setting. Physiatrist is providing close team supervision and 24 hour management of active medical problems listed below. Physiatrist and rehab team continue to assess barriers to discharge/monitor patient progress toward functional and medical goals  Care Tool:  Bathing    Body parts bathed by patient: Left arm, Chest, Abdomen, Face, Right upper leg, Left upper leg   Body parts  bathed by helper: Right arm     Bathing assist Assist Level: Total Assistance - Patient < 25%     Upper Body Dressing/Undressing Upper body dressing   What is the patient wearing?: Pull over shirt    Upper body assist Assist Level: 2 Helpers    Lower Body Dressing/Undressing Lower body dressing      What is the patient wearing?: Pants     Lower body assist Assist for lower body dressing: 2 Helpers     Toileting Toileting    Toileting assist Assist for toileting: Dependent - Patient 0%     Transfers Chair/bed transfer  Transfers assist     Chair/bed transfer assist level: 2 Helpers     Locomotion Ambulation   Ambulation assist   Ambulation activity did not occur: Safety/medical concerns          Walk 10 feet activity   Assist  Walk 10 feet activity did not occur: Safety/medical concerns        Walk 50 feet activity   Assist Walk 50 feet with 2 turns activity did not occur: Safety/medical concerns         Walk 150 feet activity   Assist Walk 150 feet activity did not occur: Safety/medical concerns         Walk 10 feet on uneven surface  activity   Assist Walk 10 feet on uneven surfaces activity did not occur: Safety/medical concerns         Wheelchair     Assist Will patient use wheelchair at discharge?: Yes Type of Wheelchair: Manual    Wheelchair assist level: Dependent - Patient 0% Max wheelchair distance: 150    Wheelchair 50 feet with 2 turns activity    Assist        Assist Level: Dependent - Patient 0%   Wheelchair 150 feet activity     Assist      Assist Level: Dependent - Patient 0%   Blood pressure 126/78, pulse 97, temperature 99.6 F (37.6 C), temperature source Oral, resp. rate 18, height 5\' 5"  (1.651 m), weight 63 kg, SpO2 100 %.    Medical Problem List and Plan: 1.  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               -patient may shower             -ELOS/Goals: MinA 3-4weeks  -Continue CIR therapies including PT, OT, and SLP    2.  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 Wife prefers no oral anticoagulation given hematoma.  -DVT/anticoagulation:  Mechanical:  Antiembolism stockings, knee (TED hose) Bilateral lower extremities             -antiplatelet therapy: N/A due to concerns of CAA 3. Pain Management: N/A.  4. Mood/arousal:  has been more alert on amantadine  -initiate ritalin trial (did not order yet) 5mg  bid             -antipsychotic agents: N/A 5. Neuropsych: This patient is not fully capable of making decisions on his own behalf. 6. Skin/Wound Care: Routine pressure relief measures. 7. Fluids/Electrolytes/Nutrition: Monitor I/O . 8. Recurrent aspiration event:    -unasyn started 6/20---will dc today             --only low grade temp. Wbc's 8k             --CXR recently stable.   -remains high aspiration risk 9. Leucocytosis: resolved 10. Blood pressure: Monitor BID--controlled without meds at this time. 11. Progressive cognitive decline: To reschedule dementia work up at [facility] after d/c. 12. Hyperglycemia: Hgb A1C- 5.8 and BS mildly elevated due to tube feeds. Continue lantus 5 units daily for now.             --Monitor BS every  4 hours and use SSI for elevated  BS  -increase lantus to 8u daily---observe  CBG (last 3)  Recent Labs    06/09/21 2002 06/10/21 0029 06/10/21 0401  GLUCAP 136* 155* 157*    13. Dysphagia: Continue NPO status with TF for nutritional support.             --oral care every 4 hours while awake.  -he will likely need PEG 14. Tachycardia: 90's - monitor HR TID 15. Elevated liver transaminases: Liver u/s with one small cystic structure.   -hep panel negative   -LFT's demonstrate ipmrovement 6/29---recheck next week -may have been d/t abx/combo, tylenol which have been stopped -Ferritin elevated but Hgb, Fe++ and TIBC all low which point  towards anemia of chronic disease.      LOS: 6 days A FACE TO FACE EVALUATION WAS PERFORMED  06/12/21 06/10/2021, 10:25 AM

## 2021-06-10 NOTE — Progress Notes (Signed)
Physical Therapy Session Note  Patient Details  Name: Donald [name] MRN: [id number] Date of Birth: July 27, 1956  Today's Date: 06/10/2021 PT Individual Time: 1003-1045 PT Individual Time Calculation (min): 42 min   Short Term Goals: Week 1:  PT Short Term Goal 1 (Week 1): Pt will perform bed mobiltiy with max assist  of 1. PT Short Term Goal 2 (Week 1): Pt will tolerate sitting in WC >2 hours PT Short Term Goal 3 (Week 1): Pt will maintain sitting balance EOB with max assist up to 2 minutes PT Short Term Goal 4 (Week 1): Pt will transfer to [facility] with max assist of 2 consistently   Skilled Therapeutic Interventions/Progress Updates:   Pt received supine in bed and agreeable to PT. Supine>sit transfer with max assist and cues for improved attention to the L. Sitting balance EOB x 5 minutes. SB transfer to [facility] with max assist +2 for safety and max multimodal instruction for improved midline orientation, attention to the LLE and sequencing of movement.   Pt transported to orthogym in [place]. Sit>stand in standing frame x 3 bouts. 1 min +3 min +2.5 min. Prolonged cervical and trunkal extension on the first 2 bouts to allow full ROM through trunk. On third bout, pt performed forward and cross body reach to grad horse shoe, and then hand to therapist on the R with partial weight shift to the R to perform lateral reach. Max assist to facilitate cervical rotation to the R to locate target. Patient returned to room and left sitting in [facility] with call bell in reach and all needs met.         Therapy Documentation Precautions:  Precautions Precautions: Fall Precaution Comments: L hemiparesis, L inattention, pusher to the L, cognitive deficits, cortrak Restrictions Weight Bearing Restrictions: No    Vital Signs: Therapy Vitals Temp: 97.9 F (36.6 C) Temp Source: Oral Pulse Rate: 92 Resp: 15 BP: 113/79 Patient Position (if appropriate): Sitting Oxygen Therapy SpO2: 100 % O2 Device: Room  Air Pain: denies    Therapy/Group: Individual Therapy  Lorie Phenix 06/10/2021, 3:22 PM

## 2021-06-10 NOTE — Progress Notes (Signed)
Occupational Therapy Session Note  Patient Details  Name: Donald [name] MRN: [id number] Date of Birth: [birth year]  Today's Date: 06/10/2021 OT Individual Time: 1341-1450 OT Individual Time Calculation (min): 69 min   Short Term Goals: Week 1:  OT Short Term Goal 1 (Week 1): Pt will maintain static sitting balance with max A of 1 in prep for seated ADL. OT Short Term Goal 2 (Week 1): Pt will completed grooming task with mod A in supported sitting. OT Short Term Goal 3 (Week 1): Pt will don shirt with max A in supported sitting. OT Short Term Goal 4 (Week 1): Pt will maintain midline orientation in supported sitting with overall mod A.  Skilled Therapeutic Interventions/Progress Updates:    Pt greeted seated in TIS wc. Pt had been up all day and reported fatigue. Pt brought to the sink in TIS wc and tilted forward. Worked on head and trunk position in sitting using mirror feedbakc to try to facilitate more neck extension. Guided A from OT to get pt's head up, unable to achieve head/neck extension on his own. Gentle stretching and ROM to L wrist, elbow, hand, forearm, and shoulder. Pt flexor tone throughout UE. UE also painful with ROM. Gentle massage and ROM to loosen. Attempted 3 sit<>stands in [place] with only 1 person assist, but unable to get upright. OT placed pillow between legs to limit L hip adduction in standing. Pt able to get enough hip extension with max/total +2 to pull down [place] seat. Facilitation to try to achieve more trunk, head and neck extension while perched on [place] using OTS, but difficulty due to weakness. Pt noted to be inocntinent of urine and was brought back to bed with [place] and Total A +2. Total +2 to transfer back into bed. Rolling with total A +2 and pillow placed between legs to decrease L hip pain when rolling. OT fit pt for SAEBO e-stim and placed on L shoulder for pain management. AEBO left on for 30 minutes. OT returned to remove SAEBO with skin intact and no  adverse reactions.  Sa[place]o Stim One 330 pulse width 35 Hz pulse rate On 8 sec/ off 8 sec Ramp up/ down 2 sec Symmetrical Biphasic wave form  Max intensity 195mA at 500 Ohm load  Pt left semi-reclined in bed at end of session with bed alarm on, call bell in reach and needs met.   Therapy Documentation Precautions:  Precautions Precautions: Fall Precaution Comments: L hemiparesis, L inattention, pusher to the L, cognitive deficits, cortrak Restrictions Weight Bearing Restrictions: No Pain:  Pt reports pain in L shoulder, repositionined and SAEBO used for pain management    Therapy/Group: Individual Therapy  Valma Cava 06/10/2021, 3:06 PM

## 2021-06-11 ENCOUNTER — Inpatient Hospital Stay (HOSPITAL_COMMUNITY): Payer: No Typology Code available for payment source

## 2021-06-11 LAB — GLUCOSE, CAPILLARY
Glucose-Capillary: 227 mg/dL — ABNORMAL HIGH (ref 70–99)
Glucose-Capillary: 148 mg/dL — ABNORMAL HIGH (ref 70–99)
Glucose-Capillary: 61 mg/dL — ABNORMAL LOW (ref 70–99)
Glucose-Capillary: 77 mg/dL (ref 70–99)
Glucose-Capillary: 120 mg/dL — ABNORMAL HIGH (ref 70–99)

## 2021-06-11 LAB — HEMOCHROMATOSIS DNA-PCR(C282Y,H63D)

## 2021-06-11 MED ORDER — OSMOLITE 1.5 CAL PO LIQD
480.0000 mL | ORAL | Status: DC
  Administered 2021-06-12 – 2021-06-13 (×2): 480 mL
  Filled 2021-06-11 (×2): qty 711

## 2021-06-11 NOTE — Progress Notes (Signed)
PROGRESS NOTE   Subjective/Complaints: Had a reasonable night. Had MBS this morning. Has been more alert.   ROS: Limited due to cognitive/behavioral    Objective:   No results found. Recent Labs    06/09/21 0504  WBC 8.0  HGB 11.9*  HCT 37.2*  PLT 372   No results for input(s): NA, K, CL, CO2, GLUCOSE, BUN, CREATININE, CALCIUM in the last 72 hours.   Intake/Output Summary (Last 24 hours) at 06/11/2021 1102 Last data filed at 06/10/2021 1245 Gross per 24 hour  Intake 100 ml  Output --  Net 100 ml         Physical Exam: Vital Signs Blood pressure 120/79, pulse (!) 102, temperature 98.6 F (37 C), resp. rate 18, height 5\' 5"  (1.651 m), weight 62.6 kg, SpO2 100 %. Constitutional: No distress . Vital signs reviewed. HEENT: EOMI, oral membranes moist Neck: supple Cardiovascular: RRR without murmur. No JVD    Respiratory/Chest: CTA Bilaterally without wheezes or rales. Normal effort    GI/Abdomen: BS +, non-tender, non-distended Ext: no clubbing, cyanosis, or edema Psych: flat, cooperative Neurological:    Comments: remains dysarthric, dysphonic. Left central 7. More alert.  follows basic commands. Left inattention. Dense left hemiparesis ongoing. Able to squeeze my right hand. Moves right leg spontaneously.     Assessment/Plan: 1. Functional deficits which require 3+ hours per day of interdisciplinary therapy in a comprehensive inpatient rehab setting. Physiatrist is providing close team supervision and 24 hour management of active medical problems listed below. Physiatrist and rehab team continue to assess barriers to discharge/monitor patient progress toward functional and medical goals  Care Tool:  Bathing    Body parts bathed by patient: Left arm, Chest, Abdomen, Face, Right upper leg, Left upper leg   Body parts bathed by helper: Right arm     Bathing assist Assist Level: Total Assistance - Patient <  25%     Upper Body Dressing/Undressing Upper body dressing   What is the patient wearing?: Pull over shirt    Upper body assist Assist Level: 2 Helpers    Lower Body Dressing/Undressing Lower body dressing      What is the patient wearing?: Pants     Lower body assist Assist for lower body dressing: 2 Helpers     Toileting Toileting    Toileting assist Assist for toileting: Dependent - Patient 0%     Transfers Chair/bed transfer  Transfers assist     Chair/bed transfer assist level: 2 Helpers     Locomotion Ambulation   Ambulation assist   Ambulation activity did not occur: Safety/medical concerns          Walk 10 feet activity   Assist  Walk 10 feet activity did not occur: Safety/medical concerns        Walk 50 feet activity   Assist Walk 50 feet with 2 turns activity did not occur: Safety/medical concerns         Walk 150 feet activity   Assist Walk 150 feet activity did not occur: Safety/medical concerns         Walk 10 feet on uneven surface  activity   Assist Walk 10 feet on  uneven surfaces activity did not occur: Safety/medical concerns         Wheelchair     Assist Will patient use wheelchair at discharge?: Yes Type of Wheelchair: Manual    Wheelchair assist level: Dependent - Patient 0% Max wheelchair distance: 150    Wheelchair 50 feet with 2 turns activity    Assist        Assist Level: Dependent - Patient 0%   Wheelchair 150 feet activity     Assist      Assist Level: Dependent - Patient 0%   Blood pressure 120/79, pulse (!) 102, temperature 98.6 F (37 C), resp. rate 18, height 5\' 5"  (1.651 m), weight 62.6 kg, SpO2 100 %.    Medical Problem List and Plan: 1.  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              -patient may shower             -ELOS/Goals: MinA 3-4weeks  -Continue CIR therapies including PT, OT, and SLP     2.   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 Wife prefers no oral anticoagulation given hematoma.  -DVT/anticoagulation:  Mechanical:  Antiembolism stockings, knee (TED hose) Bilateral lower extremities             -antiplatelet therapy: N/A due to concerns of CAA 3. Pain Management: N/A.  4. Mood/arousal:  has been more alert on amantadine  -continue ritalin 5mg  bid             -antipsychotic agents: N/A 5. Neuropsych: This patient is not fully capable of making decisions on his own behalf. 6. Skin/Wound Care: Routine pressure relief measures. 7. Fluids/Electrolytes/Nutrition: Monitor I/O . 8. Recurrent aspiration event:    -unasyn started 6/20---stopped 6/30             --  Wbc's 8k             --CXR recently stable.   -continue aspiration precautions 9. Leucocytosis: resolved 10. Blood pressure: Monitor BID--controlled without meds at this time. 11. Progressive cognitive decline: To reschedule dementia work up at [facility] after d/c. 12. Hyperglycemia: Hgb A1C- 5.8 and BS mildly elevated due to tube feeds. Continue lantus 5 units daily for now.             --Monitor BS every  4 hours and use SSI for elevated  BS  -increased lantus to 8u daily---observe  CBG (last 3)  Recent Labs    06/10/21 2351 06/11/21 0410 06/11/21 0810  GLUCAP 89 227* 148*    13. Dysphagia: MBS this morning. D1/thins diet initiated!08/12/21             --oral care every 4 hours while awake.  -will reduce TF to help with PO appetite 14. Tachycardia: 90's - monitor HR TID 15. Elevated liver transaminases: Liver u/s with one small cystic structure.   -hep panel negative   -LFT's demonstrate ipmrovement 6/29---recheck Tuesday -may have been d/t abx/combo, tylenol which have been stopped -Ferritin elevated but Hgb, Fe++ and TIBC all low which point towards anemia of chronic disease.      LOS: 7 days A FACE TO FACE EVALUATION WAS PERFORMED  Marland Kitchen 06/11/2021, 11:02 AM

## 2021-06-11 NOTE — Progress Notes (Signed)
Speech Language Pathology Weekly Progress and Note  Patient Details  Name: Eliceo Gladu MRN: [id number] Date of Birth: 1956-11-29  Beginning of progress report period: June 04, 2022 End of progress report period: June 11, 2021   Short Term Goals: Week 1: SLP Short Term Goal 1 (Week 1): Pt will consume therapeutic trials of ice chips with minimal overt s/s aspiration and mod A for use of swallow precautions SLP Short Term Goal 1 - Progress (Week 1): Met SLP Short Term Goal 2 (Week 1): Patient will utiize visual aides to orient to time/date/place/situation with modA cues. SLP Short Term Goal 2 - Progress (Week 1): Not met SLP Short Term Goal 3 (Week 1): Pt will name common objects with 90% accuracy mod A cues SLP Short Term Goal 3 - Progress (Week 1): Met SLP Short Term Goal 4 (Week 1): Pt will follow simple 1-2 step commands with 80% accuracy mod A cues SLP Short Term Goal 4 - Progress (Week 1): Met SLP Short Term Goal 5 (Week 1): Pt will verbally communicate basic wants/needs in 2-3 words phrases with mod A SLP Short Term Goal 5 - Progress (Week 1): Met SLP Short Term Goal 6 (Week 1): Pt will attend to therapeutic tasks for >5 minutes provided mod A cues SLP Short Term Goal 6 - Progress (Week 1): Met    New Short Term Goals: Week 2: SLP Short Term Goal 1 (Week 2): Patient will consume current diet with minimal overt s/s of aspiration and Mod verbal cues for use of swallowing compensatory strategies. SLP Short Term Goal 2 (Week 2): Patient will locate/attend to left field of enviornment in 50% of opportunities with Max A verbal cues. SLP Short Term Goal 3 (Week 2): Patient will orient to place, time and situation with Max visual aids. SLP Short Term Goal 4 (Week 2): Patient will demonstrate sustained attention to functional tasks for 5 minutes with Mod verbal cues for recdirection. SLP Short Term Goal 5 (Week 2): Patient will utilize speech intelligibility strategies to achieve ~75%  intelligibility at the phrase level with Min verbal cues.  Weekly Progress Updates: Patient has made functional gains and has met 5 of 6 STGs this reporting period. Currently, patient continues to require Mod verbal cues and extra time to initiate swallowing his saliva but demonstrates overall increased ability to manage his secretions. Due to this, patient had an MBS today and started on a diet of Dys. 1 textures with thin liquids. No penetration or aspiration was noted but intermittent oral holding did occur requiring cues. RMT was also initiated with focus on cough strength. Patient can follow 1-step commands appropriately and can verbalize his wants/needs at the phrase and sentence level with intermittent dysfluency. Intelligibility is also reduced requiring Mod verbal cues. Patient also requires Max A verbal cues for sustained attention and visual scanning to left field of environment. Patient and family education ongoing. Patient would benefit from continued skilled SLP intervention to maximize his swallowing, cognitive and speech function prior to discharge.      Intensity: Minumum of 1-2 x/day, 30 to 90 minutes Frequency: 3 to 5 out of 7 days Duration/Length of Stay: 3-3.5 weeks Treatment/Interventions: Cognitive remediation/compensation;Dysphagia/aspiration precaution training;Internal/external aids;Speech/Language facilitation;Environmental controls;Cueing hierarchy;Functional tasks;Patient/family education;Therapeutic Exercise;Therapeutic Activities;Multimodal communication approach     Koen Antilla 06/11/2021, 4:04 PM

## 2021-06-11 NOTE — Progress Notes (Signed)
Modified Barium Swallow Progress Note  Patient Details  Name: Donald [name] MRN: [id number] Date of Birth: 02-14-1956  Today's Date: 06/11/2021  Modified Barium Swallow completed.  Full report located under Chart Review in the Imaging Section.  Brief recommendations include the following:  Clinical Impression  Patient demonstrates a moderate-severe oral dysphagia that is exacerbated by cognitive deficits. Patient demonstrates oral holding with lingual pumping and poor posterior propulsion with poor attention to bolus resulting in inconsistent timing of swallow initiation. Despite inconsistent timing, patient able to fully protect his airway even when challenged with sequential sips via straw. Recommend patient initiate a diet of Dys. 1 textures with thin liquids via straw. Full supervision needed for verbal and tactile cues to reduce oral holding and important to limit distractions at meals.   Swallow Evaluation Recommendations       SLP Diet Recommendations: Dysphagia 1 (Puree) solids;Thin liquid   Liquid Administration via: Straw;Cup   Medication Administration: via tube   Supervision: Patient able to self feed;Full supervision/cueing for compensatory strategies   Compensations: Minimize environmental distractions;Lingual sweep for clearance of pocketing;Follow solids with liquid       Oral Care Recommendations: Oral care BID        [name] 06/11/2021,3:55 PM

## 2021-06-11 NOTE — Significant Event (Signed)
Hypoglycemic Event  CBG:  61 Treatment: 4 oz juice/soda  Symptoms: None  Follow-up CBG: Time:1727 CBG Result:77  Possible Reasons for Event: Inadequate meal intake  Comments/MD notified: Patient was asymptomatic. Patient CBG stabled . No MD notified.    [employer]

## 2021-06-11 NOTE — Progress Notes (Signed)
Physical Therapy Weekly Progress Note  Patient Details  Name: Donald [name] MRN: [id number] Date of Birth: 09/10/1956  Beginning of progress report period: June 05, 2021 End of progress report period: June 11, 2021  Today's Date: 06/11/2021 PT Individual Time: 1105-1200 PT Individual Time Calculation (min): 55 min   Patient has met 4 of 4 short term goals.  Pt is making slow progress towards LTG of mod assist overall. Mild  Improvements in arousal, attention and postural  control have allowed to pt to progress to max assist bed mobility of 1, Max assist +2 for SB transfers with improved initiation with lateral scoots, and improved tolerance to sitting in WC 2-3 hours between therapies.   Patient continues to demonstrate the following deficits  and therefore will continue to benefit from skilled PT interventiomuscle weakness, muscle joint tightness, and muscle paralysis, decreased cardiorespiratoy endurance, impaired timing and sequencing, abnormal tone, unbalanced muscle activation, motor apraxia, decreased coordination, and decreased motor planning, decreased visual perceptual skills, decreased visual motor skills, field cut, and hemianopsia, decreased midline orientation, decreased attention to left, left side neglect, and decreased motor planning, decreased initiation, decreased attention, decreased awareness, decreased problem solving, decreased safety awareness, decreased memory, and delayed processing, and decreased sitting balance, decreased standing balance, decreased postural control, hemiplegia, and decreased balance strategiesn to increase functional independence with mobility.  Patient progressing toward long term goals..  Continue plan of care.  PT Short Term Goals Week 1:  PT Short Term Goal 1 (Week 1): Pt will perform bed mobiltiy with max assist  of 1. PT Short Term Goal 1 - Progress (Week 1): Met PT Short Term Goal 2 (Week 1): Pt will tolerate sitting in WC >2 hours PT Short  Term Goal 2 - Progress (Week 1): Met PT Short Term Goal 3 (Week 1): Pt will maintain sitting balance EOB with max assist up to 2 minutes PT Short Term Goal 3 - Progress (Week 1): Met PT Short Term Goal 4 (Week 1): Pt will transfer to [facility] with max assist of 2 consistently PT Short Term Goal 4 - Progress (Week 1): Met Week 2:  PT Short Term Goal 1 (Week 2): Pt will perform bed<>WC transfer with max assist of 1 PT Short Term Goal 2 (Week 2): Pt will perform gait training at rail in hall x 47f with max A +2 PT Short Term Goal 3 (Week 2): Pt will perform WC propulsion x57fand mod assist using hemi technique PT Short Term Goal 4 (Week 2): Pt will perform sitting balance EOB with mod assist up to 5 minutes  Skilled Therapeutic Interventions/Progress Updates:   Pt received supine in bed and agreeable to PT requesting to urinate. Supine>sit transfer with max assist and max cues through log roll to the R, mild discomfort on the L proximal thigh with roll. Once sitting EOB Pt able to maintain balance with mod assist for safety to prevent L LOB, but unable to void into urinal. Lateral lean with max assist pull pants back to waist.   SB transfer to WC with max A and +2 Pt for safety and to stabilize SB. Pt able to initiate each scoot with increased time and PT blocking the LLE and provide assist for lateral transfer across SB.   Sit<>stand x 4 at rail in hall with max-total A to block the LLE as well as facilitate hip, trunk, and cervical extension. Visual feedback from mirror to improve activation of extensors in stance with mild improvement at hip. PT  encouraged visual scanning to the L once in stance to locate multiple targets the L of mirror. Able to scan past midline 60% of trials on this day.   Pt reported need for urination. Transported to room in TIS [facility] [place]. Sit<>stand with UE  on bed rail and max assist to facilitate hip/trunk extension. Attempted urination in standing, but pt noted to have been  incontinent. Total A +2 for clothing management to doff/don brief in standing.   Patient returned to room and left sitting in [facility] with call bell in reach and all needs met.           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

## 2021-06-11 NOTE — Progress Notes (Signed)
1Occupational Therapy Weekly Progress Note  Patient Details  Name: Donald [name] MRN: [id number] Date of Birth: 09-29-1956  Beginning of progress report period: June 05, 2021 End of progress report period: June 11, 2021  Today's Date: 06/11/2021 OT Individual Time: 9147-8295 OT Individual Time Calculation (min): 58 min    Patient has met 1 of 4 short term goals.  Patient is making slow, but steady progress towards OT goals. Pt has demonstrated improved attention, awareness, initiation, and overall arousal within BADL tasks. Static sitting balance has improved to max A of 1, but still needs +2 for any functional BADL tasks and total A for slideboard transfers. Continue current POC.   Patient continues to demonstrate the following deficits: muscle weakness and muscle joint tightness, decreased cardiorespiratoy endurance, impaired timing and sequencing, abnormal tone, unbalanced muscle activation, motor apraxia, ataxia, decreased coordination, and decreased motor planning, decreased midline orientation, decreased attention to left, left side neglect, and decreased motor planning, decreased initiation, decreased attention, decreased awareness, decreased problem solving, decreased safety awareness, decreased memory, and delayed processing, and decreased sitting balance, decreased standing balance, decreased postural control, hemiplegia, decreased balance strategies, and difficulty maintaining precautions and therefore will continue to benefit from skilled OT intervention to enhance overall performance with BADL and Reduce care partner burden.  Patient progressing toward long term goals..  Continue plan of care.  OT Short Term Goals Week 1:  OT Short Term Goal 1 (Week 1): Pt will maintain static sitting balance with max A of 1 in prep for seated ADL. OT Short Term Goal 1 - Progress (Week 1): Met OT Short Term Goal 2 (Week 1): Pt will completed grooming task with mod A in supported sitting. OT Short  Term Goal 2 - Progress (Week 1): Progressing toward goal OT Short Term Goal 3 (Week 1): Pt will don shirt with max A in supported sitting. OT Short Term Goal 3 - Progress (Week 1): Progressing toward goal OT Short Term Goal 4 (Week 1): Pt will maintain midline orientation in supported sitting with overall mod A. OT Short Term Goal 4 - Progress (Week 1): Progressing toward goal Week 2:  OT Short Term Goal 1 (Week 2): Pt will don shirt with max A in supported sitting. OT Short Term Goal 2 (Week 2): Pt will maintain midline orientation in supported sitting with overall max A. OT Short Term Goal 3 (Week 2): Pt will completed grooming task with mod A in supported sitting.  Skilled Therapeutic Interventions/Progress Updates:    Pt greeted semi-reclined in bed awake and agreeable to OT treatment session. Pt stated his brief had just been changed, and pt confirmed to be clean. Worked on washing B LE's at bed level. Max cues to initiate. Pt needed OT assist to bend L LE and bring L hip into neutral position as it had been externally rotated all night. Pt  with increased pain noted at hip and running down leg. Max cues and hand over hand to bring attention to L side to wash L thigh. Total A to thread pant legs, then worked on trying to bridge hips, but unable to activate glutes enough to lift. Rolling to pull up pats with Max +2 to the R, then total A to roll to the R due to pain throughout L side of body. Pt in flexed posturing due to pain. Max cues to initiate bed mobility, but kept pulling R LE back into bed and easily distracted. Total +2 to get to sitting EOB with painful L  side. Worked on sitting balance at EOB during UB bathing/dressing tasks. Pt was able to achieve static sitting balance with intermittent mod A, but mostly Max A for static sitting and +2 needed to assist with any dynamic task or functional task. Worked on L attention and L NMR with hand over hand A to integrate L UE into tasks. Pt needed  support at L shoulder to reduce sublux before OT assisted with horizontal adduction to wash R arm. Pt unable to initiate or sequence UB dressing task requiring total A. Suction used a few times at bedside to manage secretions. Worked on head/neck and trunk extension in unsupported sitting to reach for OTS just outside base of support. Total A +2 to attempt lateral scoot at EOB and Total +2 to return to supine,. Pt left semi-reclined in bed with bed alarm on, call bell in reach, and spouse present.    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 Pt also has pain in shoulder. Rest and repositioned for pain management.    Therapy/Group: Individual Therapy  Valma Cava 06/11/2021, 8:45 AM

## 2021-06-11 NOTE — Progress Notes (Signed)
Occupational Therapy Session Note  Patient Details  Name: Donald [name] MRN: [id number] Date of Birth: [birth year]  Today's Date: 06/11/2021 OT Individual Time: 1230-1340 OT Individual Time Calculation (min): 70 min    Short Term Goals: Week 2:  OT Short Term Goal 1 (Week 2): Pt will don shirt with max A in supported sitting. OT Short Term Goal 2 (Week 2): Pt will maintain midline orientation in supported sitting with overall max A. OT Short Term Goal 3 (Week 2): Pt will completed grooming task with mod A in supported sitting.  Skilled Therapeutic Interventions/Progress Updates:    Pt greeted seated in TIS wc and agreeable to OT treatment session. OT brought pt lunch tray of thin liquids and D1 puree diet. Worked on visual scanning to midline with max verbal cues for head and eye turns to locate food items. Pt was able to manage utensil and bring one bight of food to mouth, but then would just play with food bolus in mouth and needed max multimodal cues and increased time with multiple swallows to get down food. Pt also orally holding thin liquid and siwshing it around. Pt did better with familiar taste of coconut water his wife brought. SLP enterred room to see how pt was doing with tray. Suction needed on one occasion get out out food bolus. SLP discussed with wife about bringing in puree food items that pt liked. Overall pt took 4 bites of food with max multimodal cues to elicit swallow. Pt brought to therapy gym and worked on sit<stand and standing endurance with standing frame. Belt used for first stand, then max A +2 for the next two stands. Facilitation to achieve upright trunk. Tolerated standing for 1 minute intervals. Pt returned to room and total A +2 slideboard transfer back to bed. Pt left semi-reclined in bed with bed alarm on, call bell in reach, and needs met.  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

## 2021-06-12 LAB — CBC
HCT: 39.9 % (ref 39.0–52.0)
Hemoglobin: 12.6 g/dL — ABNORMAL LOW (ref 13.0–17.0)
MCH: 29.4 pg (ref 26.0–34.0)
MCHC: 31.6 g/dL (ref 30.0–36.0)
MCV: 93.2 fL (ref 80.0–100.0)
Platelets: 437 10*3/uL — ABNORMAL HIGH (ref 150–400)
RBC: 4.28 MIL/uL (ref 4.22–5.81)
RDW: 13.3 % (ref 11.5–15.5)
WBC: 5.9 10*3/uL (ref 4.0–10.5)
nRBC: 0 % (ref 0.0–0.2)

## 2021-06-12 LAB — BASIC METABOLIC PANEL
Anion gap: 8 (ref 5–15)
BUN: 13 mg/dL (ref 8–23)
CO2: 28 mmol/L (ref 22–32)
Calcium: 9.1 mg/dL (ref 8.9–10.3)
Chloride: 98 mmol/L (ref 98–111)
Creatinine, Ser: 0.81 mg/dL (ref 0.61–1.24)
GFR, Estimated: >60 mL/min (ref >60)
Glucose, Bld: 134 mg/dL — ABNORMAL HIGH (ref 70–99)
Potassium: 3.6 mmol/L (ref 3.5–5.1)
Sodium: 134 mmol/L — ABNORMAL LOW (ref 135–145)

## 2021-06-12 LAB — GLUCOSE, CAPILLARY
Glucose-Capillary: 108 mg/dL — ABNORMAL HIGH (ref 70–99)
Glucose-Capillary: 187 mg/dL — ABNORMAL HIGH (ref 70–99)
Glucose-Capillary: 128 mg/dL — ABNORMAL HIGH (ref 70–99)
Glucose-Capillary: 102 mg/dL — ABNORMAL HIGH (ref 70–99)
Glucose-Capillary: 75 mg/dL (ref 70–99)
Glucose-Capillary: 142 mg/dL — ABNORMAL HIGH (ref 70–99)

## 2021-06-12 MED ORDER — INSULIN ASPART 100 UNIT/ML IJ SOLN
0.0000 [IU] | Freq: Three times a day (TID) | INTRAMUSCULAR | Status: DC
  Administered 2021-06-12 – 2021-06-17 (×6): 1 [IU] via SUBCUTANEOUS

## 2021-06-12 MED ORDER — MEGESTROL ACETATE 400 MG/10ML PO SUSP
400.0000 mg | Freq: Two times a day (BID) | ORAL | Status: DC
  Administered 2021-06-12 – 2021-06-22 (×21): 400 mg via ORAL
  Filled 2021-06-12 (×21): qty 10

## 2021-06-12 NOTE — Plan of Care (Signed)
  Problem: Consults Goal: RH STROKE PATIENT EDUCATION Description: See Patient Education module for education specifics  Outcome: Progressing Goal: Nutrition Consult-if indicated Outcome: Progressing   Problem: RH SKIN INTEGRITY Goal: RH STG MAINTAIN SKIN INTEGRITY WITH ASSISTANCE Description: STG Maintain Skin Integrity With min Assistance. Outcome: Progressing   Problem: RH SAFETY Goal: RH STG ADHERE TO SAFETY PRECAUTIONS W/ASSISTANCE/DEVICE Description: STG Adhere to Safety Precautions With cues and reminders Outcome: Progressing   Problem: RH KNOWLEDGE DEFICIT Goal: RH STG INCREASE KNOWLEDGE OF HYPERTENSION Description: Pt and family will be able to demonstrate understanding of medication management, dietary and lifestyle modifications to better control blood pressure and prevent stroke using handouts and booklets provided with mod I assist.  Outcome: Progressing Goal: RH STG INCREASE KNOWLEGDE OF HYPERLIPIDEMIA Description: Pt and family will be able to demonstrate understanding of medication management, dietary and lifestyle modifications to better control cholesterol levels and prevent stroke using handouts and booklets provided with mod I assist.  Outcome: Progressing Goal: RH STG INCREASE KNOWLEDGE OF STROKE PROPHYLAXIS Description: Pt and family will be able to demonstrate understanding of medication management, dietary and lifestyle modifications to better control blood pressure and prevent stroke using handouts and booklets provided with mod I assist.  Outcome: Progressing

## 2021-06-12 NOTE — Progress Notes (Signed)
Occupational Therapy Session Note  Patient Details  Name: Donald [name] MRN: [id number] Date of Birth: 05/05/1956  Today's Date: 06/12/2021 OT Individual Time: 1000-1100 OT Individual Time Calculation (min): 60 min    Short Term Goals: Week 1:  OT Short Term Goal 1 (Week 1): Pt will maintain static sitting balance with max A of 1 in prep for seated ADL. OT Short Term Goal 1 - Progress (Week 1): Met OT Short Term Goal 2 (Week 1): Pt will completed grooming task with mod A in supported sitting. OT Short Term Goal 2 - Progress (Week 1): Progressing toward goal OT Short Term Goal 3 (Week 1): Pt will don shirt with max A in supported sitting. OT Short Term Goal 3 - Progress (Week 1): Progressing toward goal OT Short Term Goal 4 (Week 1): Pt will maintain midline orientation in supported sitting with overall mod A. OT Short Term Goal 4 - Progress (Week 1): Progressing toward goal  Skilled Therapeutic Interventions/Progress Updates:     Pt received in bed with unrated pain in L side. Repositioning provided for pain relief  ADL:  Pt completes UB dressing with MAX A seated in TIS with MAX facilitation of L gaze to locate shirt just L of midline with pt demo impaired tracking to L Pt completes LB dressing with total A at bed levelw ith MOD-total A for rolling in B directions using bed rails Pt completes footwear with dependent Total A SB transfer to TIS from EOB with VC forhand placement and counting to improve initiation  NMR: Scapular mobilizations with total A and education for importance of scapular mobility to improve functional reach/NMR. No activation noted despite cuing. Cervical stretching (lateral flexion/L rotation) to improve torticollis.  Saebo Stim One 330 pulse width 35 Hz pulse rate On 8 sec/ off 8 sec Ramp up/ down 2 sec Symmetrical Biphasic wave form  Max intensity 163m at 500 Ohm load Deltoid stim 60 min with good contraction. 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, cognitive deficits, cortrak Restrictions Weight Bearing Restrictions: No General:   Vital Signs: Therapy Vitals Temp: 98.6 F (37 C) Temp Source: Oral Pulse Rate: (!) 102 Resp: 18 BP: 137/87 Patient Position (if appropriate): Lying Oxygen Therapy SpO2: 100 % O2 Device: Room Air Pain:   ADL: ADL Eating: NPO Grooming: Dependent Upper Body Bathing: Dependent Where Assessed-Upper Body Bathing: Bed level Lower Body Bathing: Dependent Where Assessed-Lower Body Bathing: Bed level Upper Body Dressing: Dependent Where Assessed-Upper Body Dressing: Bed level Lower Body Dressing: Dependent Where Assessed-Lower Body Dressing: Bed level Toileting: Dependent Where Assessed-Toileting: Bed level Toilet Transfer: Not assessed Tub/Shower Transfer: Not assessed WGafferTransfer: Not assessed Vision   Perception    Praxis   Exercises:   Other Treatments:     Therapy/Group: Individual Therapy  STonny Branch7/01/2021, 6:49 AM

## 2021-06-12 NOTE — Progress Notes (Signed)
PROGRESS NOTE   Subjective/Complaints: Pt awake in bed. Alert, in good spirits! Told me he ate all of his dinner last night but nurses report 0%. Refused breafkast also  ROS: Limited due to cognitive/behavioral    Objective:   DG Swallowing Func-Speech Pathology  Result Date: 06/11/2021 Formatting of this result is different from the original. Objective Swallowing Evaluation: Type of Study: MBS-Modified Barium Swallow Study  Patient Details Name: Donald [name] MRN: [id number] Date of Birth: 06/16/1956 Today's Date: 06/11/2021 Past Medical History: Past Medical History: Diagnosis Date  Arthritis  Past Surgical History: Past Surgical History: Procedure Laterality Date  APPENDECTOMY    IR ANGIO INTRA EXTRACRAN SEL COM CAROTID INNOMINATE BILAT MOD SED  05/28/2021  IR ANGIO VERTEBRAL SEL VERTEBRAL BILAT MOD SED  05/28/2021  IR IVC FILTER PLMT / S&I /IMG GUID/MOD SED  05/27/2021  IR US GUIDE VASC ACCESS RIGHT  05/27/2021 HPI: See H&P  No data recorded Assessment / Plan / Recommendation CHL IP CLINICAL IMPRESSIONS 06/11/2021 Clinical Impression Patient demonstrates a moderate-severe oral dysphagia that is exacerbated by cognitive deficits. Patient demonstrates oral holding with lingual pumping and poor posterior propulsion with poor attention to bolus resulting in inconsistent timing of swallow initiation. Despite inconsistent timing, patient able to fully protect his airway even when challenged with sequential sips via straw. Recommend patient initiate a diet of Dys. 1 textures with thin liquids via straw. Full supervision needed for verbal and tactile cues to reduce oral holding and important to limit distractions at meals. SLP Visit Diagnosis Dysphagia, oral phase (R13.11) Attention and concentration deficit following -- Frontal lobe and executive function deficit following -- Impact on safety and function Mild aspiration risk;Moderate aspiration risk   CHL IP  TREATMENT RECOMMENDATION 06/11/2021 Treatment Recommendations Therapy as outlined in treatment plan below   Prognosis 06/11/2021 Prognosis for Safe Diet Advancement Good Barriers to Reach Goals Cognitive deficits Barriers/Prognosis Comment -- CHL IP DIET RECOMMENDATION 06/11/2021 SLP Diet Recommendations Dysphagia 1 (Puree) solids;Thin liquid Liquid Administration via Straw;Cup Medication Administration Crushed with puree Compensations Minimize environmental distractions;Lingual sweep for clearance of pocketing;Follow solids with liquid Postural Changes --   CHL IP OTHER RECOMMENDATIONS 06/11/2021 Recommended Consults -- Oral Care Recommendations Oral care BID Other Recommendations --   CHL IP FOLLOW UP RECOMMENDATIONS 06/11/2021 Follow up Recommendations Home health SLP   CHL IP FREQUENCY AND DURATION 06/11/2021 Speech Therapy Frequency (ACUTE ONLY) min 3x week Treatment Duration 2 weeks      CHL IP ORAL PHASE 06/11/2021 Oral Phase Impaired Oral - Pudding Teaspoon -- Oral - Pudding Cup -- Oral - Honey Teaspoon -- Oral - Honey Cup -- Oral - Nectar Teaspoon Reduced posterior propulsion;Delayed oral transit;Lingual pumping;Weak lingual manipulation Oral - Nectar Cup Lingual pumping;Delayed oral transit;Reduced posterior propulsion;Weak lingual manipulation Oral - Nectar Straw Delayed oral transit;Reduced posterior propulsion;Lingual pumping;Weak lingual manipulation Oral - Thin Teaspoon Delayed oral transit;Lingual pumping;Reduced posterior propulsion;Weak lingual manipulation Oral - Thin Cup -- Oral - Thin Straw Lingual pumping;Reduced posterior propulsion;Delayed oral transit;Weak lingual manipulation Oral - Puree Weak lingual manipulation;Lingual pumping;Delayed oral transit;Reduced posterior propulsion Oral - Mech Soft Impaired mastication;Delayed oral transit;Weak lingual manipulation;Lingual pumping;Reduced posterior propulsion Oral - Regular -- Oral - Multi-Consistency --  Oral - Pill -- Oral Phase - Comment --  CHL IP  PHARYNGEAL PHASE 06/11/2021 Pharyngeal Phase Impaired Pharyngeal- Pudding Teaspoon -- Pharyngeal -- Pharyngeal- Pudding Cup -- Pharyngeal -- Pharyngeal- Honey Teaspoon -- Pharyngeal -- Pharyngeal- Honey Cup -- Pharyngeal -- Pharyngeal- Nectar Teaspoon Delayed swallow initiation-vallecula Pharyngeal Material does not enter airway Pharyngeal- Nectar Cup Delayed swallow initiation-pyriform sinuses Pharyngeal -- Pharyngeal- Nectar Straw Delayed swallow initiation-pyriform sinuses Pharyngeal Material does not enter airway Pharyngeal- Thin Teaspoon Delayed swallow initiation-vallecula Pharyngeal -- Pharyngeal- Thin Cup -- Pharyngeal -- Pharyngeal- Thin Straw Delayed swallow initiation-pyriform sinuses Pharyngeal -- Pharyngeal- Puree Delayed swallow initiation-vallecula Pharyngeal -- Pharyngeal- Mechanical Soft (had to suction from oral cavity) Pharyngeal -- Pharyngeal- Regular -- Pharyngeal -- Pharyngeal- Multi-consistency -- Pharyngeal -- Pharyngeal- Pill -- Pharyngeal -- Pharyngeal Comment --  CHL IP CERVICAL ESOPHAGEAL PHASE 06/11/2021 Cervical Esophageal Phase WFL Pudding Teaspoon -- Pudding Cup -- Honey Teaspoon -- Honey Cup -- Nectar Teaspoon -- Nectar Cup -- Nectar Straw -- Thin Teaspoon -- Thin Cup -- Thin Straw -- Puree -- Mechanical Soft -- Regular -- Multi-consistency -- Pill -- Cervical Esophageal Comment -- PAYNE, COURTNEY 06/11/2021, 3:55 PM  Feliberto Gottron, MA, CCC-SLP [phone]             Recent Labs    06/12/21 0545  WBC 5.9  HGB 12.6*  HCT 39.9  PLT 437*   Recent Labs    06/12/21 0545  NA 134*  K 3.6  CL 98  CO2 28  GLUCOSE 134*  BUN 13  CREATININE 0.81  CALCIUM 9.1     Intake/Output Summary (Last 24 hours) at 06/12/2021 1143 Last data filed at 06/12/2021 1194 Gross per 24 hour  Intake 0 ml  Output --  Net 0 ml         Physical Exam: Vital Signs Blood pressure 137/87, pulse (!) 102, temperature 98.6 F (37 C), temperature source Oral, resp. rate 18, height 5\' 5"  (1.651 m),  weight 65.1 kg, SpO2 100 %. Constitutional: No distress . Vital signs reviewed. HEENT: EOMI, oral membranes moist, NGT. Voice less wet-sounding today Neck: supple Cardiovascular: RRR without murmur. No JVD    Respiratory/Chest: CTA Bilaterally without wheezes or rales. Normal effort    GI/Abdomen: BS +, non-tender, non-distended Ext: no clubbing, cyanosis, or edema Psych: pleasant and cooperative Neurological:    Comments: dysarthric but voice clearer. Left central 7. More alert.  follows basic commands. Left inattention. Dense left hemiparesis without change. Able to squeeze my right hand. Moves right leg spontaneously.     Assessment/Plan: 1. Functional deficits which require 3+ hours per day of interdisciplinary therapy in a comprehensive inpatient rehab setting. Physiatrist is providing close team supervision and 24 hour management of active medical problems listed below. Physiatrist and rehab team continue to assess barriers to discharge/monitor patient progress toward functional and medical goals  Care Tool:  Bathing    Body parts bathed by patient: Left arm, Chest, Abdomen, Face, Right upper leg, Left upper leg   Body parts bathed by helper: Right arm     Bathing assist Assist Level: Total Assistance - Patient < 25%     Upper Body Dressing/Undressing Upper body dressing   What is the patient wearing?: Pull over shirt    Upper body assist Assist Level: 2 Helpers    Lower Body Dressing/Undressing Lower body dressing      What is the patient wearing?: Pants     Lower body assist Assist for lower body dressing: 2 Helpers  Toileting Toileting    Toileting assist Assist for toileting: Dependent - Patient 0%     Transfers Chair/bed transfer  Transfers assist     Chair/bed transfer assist level: 2 Helpers     Locomotion Ambulation   Ambulation assist   Ambulation activity did not occur: Safety/medical concerns          Walk 10 feet  activity   Assist  Walk 10 feet activity did not occur: Safety/medical concerns        Walk 50 feet activity   Assist Walk 50 feet with 2 turns activity did not occur: Safety/medical concerns         Walk 150 feet activity   Assist Walk 150 feet activity did not occur: Safety/medical concerns         Walk 10 feet on uneven surface  activity   Assist Walk 10 feet on uneven surfaces activity did not occur: Safety/medical concerns         Wheelchair     Assist Will patient use wheelchair at discharge?: Yes Type of Wheelchair: Manual    Wheelchair assist level: Dependent - Patient 0% Max wheelchair distance: 150    Wheelchair 50 feet with 2 turns activity    Assist        Assist Level: Dependent - Patient 0%   Wheelchair 150 feet activity     Assist      Assist Level: Dependent - Patient 0%   Blood pressure 137/87, pulse (!) 102, temperature 98.6 F (37 C), temperature source Oral, resp. rate 18, height 5\' 5"  (1.651 m), weight 65.1 kg, SpO2 100 %.    Medical Problem List and Plan: 1.  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              -patient may shower             -ELOS/Goals: MinA 3-4weeks  -Continue CIR therapies including PT, OT, and SLP     2.  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 Wife prefers no oral anticoagulation given hematoma.  -DVT/anticoagulation:  Mechanical:  Antiembolism stockings, knee (TED hose) Bilateral lower extremities             -antiplatelet therapy: N/A due to concerns of CAA 3. Pain Management: N/A.  4. Mood/arousal:  has been more alert on amantadine---continue for short term  -continue ritalin 5mg  bid             -antipsychotic agents: N/A 5. Neuropsych: This patient is not fully capable of making decisions on his own behalf. 6. Skin/Wound Care: Routine pressure relief measures. 7. Fluids/Electrolytes/Nutrition: Monitor I/O . 8.  Recurrent aspiration event:    -unasyn started 6/20---stopped 6/30             --  Wbc's 8k             --CXR recently stable.   -continue aspiration precautions, sounds better 7/2 9. Leucocytosis: resolved 10. Blood pressure: Monitor BID--controlled without meds at this time. 11. Progressive cognitive decline: To reschedule dementia work up at [facility] after d/c. 12. Hyperglycemia: Hgb A1C- 5.8 and BS mildly elevated due to tube feeds. Continue lantus 5 units daily for now.             --Monitor BS every  4 hours and use SSI for elevated  BS  -increased lantus to 8u daily---no changes as we're moving to PO diet  CBG (last 3)  Recent Labs  06/12/21 0123 06/12/21 0410 06/12/21 0615  GLUCAP 108* 187* 128*    13. Dysphagia: MBS this morning. D1/thins diet initiated!Marland Kitchen             --oral care every 4 hours while awake.  -have changed TF's to night to help stimulate PO intake  7/2 -megace trial  14. Tachycardia: 90's - monitor HR TID 15. Elevated liver transaminases: Liver u/s with one small cystic structure.   -hep panel negative   -LFT's demonstrate ipmrovement 6/29---recheck Tuesday -may have been d/t abx/combo, tylenol which have been stopped -Ferritin elevated but Hgb, Fe++ and TIBC all low which point towards anemia of chronic disease.      LOS: 8 days A FACE TO FACE EVALUATION WAS PERFORMED  Ranelle Oyster 06/12/2021, 11:43 AM

## 2021-06-13 LAB — GLUCOSE, CAPILLARY
Glucose-Capillary: 137 mg/dL — ABNORMAL HIGH (ref 70–99)
Glucose-Capillary: 101 mg/dL — ABNORMAL HIGH (ref 70–99)
Glucose-Capillary: 86 mg/dL (ref 70–99)
Glucose-Capillary: 117 mg/dL — ABNORMAL HIGH (ref 70–99)

## 2021-06-13 NOTE — Plan of Care (Signed)
  Problem: Consults Goal: RH STROKE PATIENT EDUCATION Description: See Patient Education module for education specifics  Outcome: Progressing Goal: Nutrition Consult-if indicated Outcome: Progressing   Problem: RH BOWEL ELIMINATION Goal: RH STG MANAGE BOWEL WITH ASSISTANCE Description: STG Manage Bowel with min Assistance. Outcome: Progressing   Problem: RH BLADDER ELIMINATION Goal: RH STG MANAGE BLADDER WITH ASSISTANCE Description: STG Manage Bladder With min Assistance Outcome: Progressing   Problem: RH SKIN INTEGRITY Goal: RH STG MAINTAIN SKIN INTEGRITY WITH ASSISTANCE Description: STG Maintain Skin Integrity With min Assistance. Outcome: Progressing   Problem: RH SAFETY Goal: RH STG ADHERE TO SAFETY PRECAUTIONS W/ASSISTANCE/DEVICE Description: STG Adhere to Safety Precautions With cues and reminders Outcome: Progressing   Problem: RH KNOWLEDGE DEFICIT Goal: RH STG INCREASE KNOWLEDGE OF HYPERTENSION Description: Pt and family will be able to demonstrate understanding of medication management, dietary and lifestyle modifications to better control blood pressure and prevent stroke using handouts and booklets provided with mod I assist.  Outcome: Progressing Goal: RH STG INCREASE KNOWLEGDE OF HYPERLIPIDEMIA Description: Pt and family will be able to demonstrate understanding of medication management, dietary and lifestyle modifications to better control cholesterol levels and prevent stroke using handouts and booklets provided with mod I assist.  Outcome: Progressing Goal: RH STG INCREASE KNOWLEDGE OF STROKE PROPHYLAXIS Description: Pt and family will be able to demonstrate understanding of medication management, dietary and lifestyle modifications to better control blood pressure and prevent stroke using handouts and booklets provided with mod I assist.  Outcome: Progressing   

## 2021-06-13 NOTE — Progress Notes (Signed)
PROGRESS NOTE   Subjective/Complaints: Pt up in bed. Very alert. Welcomed me into the room!   ROS: Limited due to cognitive/behavioral    Objective:   DG Swallowing Func-Speech Pathology  Result Date: 06/11/2021 Formatting of this result is different from the original. Objective Swallowing Evaluation: Type of Study: MBS-Modified Barium Swallow Study  Patient Details Name: Donald [name] MRN: [id number] Date of Birth: 05/16/1956 Today's Date: 06/11/2021 Past Medical History: Past Medical History: Diagnosis Date  Arthritis  Past Surgical History: Past Surgical History: Procedure Laterality Date  APPENDECTOMY    IR ANGIO INTRA EXTRACRAN SEL COM CAROTID INNOMINATE BILAT MOD SED  05/28/2021  IR ANGIO VERTEBRAL SEL VERTEBRAL BILAT MOD SED  05/28/2021  IR IVC FILTER PLMT / S&I /IMG GUID/MOD SED  05/27/2021  IR US GUIDE VASC ACCESS RIGHT  05/27/2021 HPI: See H&P  No data recorded Assessment / Plan / Recommendation CHL IP CLINICAL IMPRESSIONS 06/11/2021 Clinical Impression Patient demonstrates a moderate-severe oral dysphagia that is exacerbated by cognitive deficits. Patient demonstrates oral holding with lingual pumping and poor posterior propulsion with poor attention to bolus resulting in inconsistent timing of swallow initiation. Despite inconsistent timing, patient able to fully protect his airway even when challenged with sequential sips via straw. Recommend patient initiate a diet of Dys. 1 textures with thin liquids via straw. Full supervision needed for verbal and tactile cues to reduce oral holding and important to limit distractions at meals. SLP Visit Diagnosis Dysphagia, oral phase (R13.11) Attention and concentration deficit following -- Frontal lobe and executive function deficit following -- Impact on safety and function Mild aspiration risk;Moderate aspiration risk   CHL IP TREATMENT RECOMMENDATION 06/11/2021 Treatment Recommendations Therapy as  outlined in treatment plan below   Prognosis 06/11/2021 Prognosis for Safe Diet Advancement Good Barriers to Reach Goals Cognitive deficits Barriers/Prognosis Comment -- CHL IP DIET RECOMMENDATION 06/11/2021 SLP Diet Recommendations Dysphagia 1 (Puree) solids;Thin liquid Liquid Administration via Straw;Cup Medication Administration Crushed with puree Compensations Minimize environmental distractions;Lingual sweep for clearance of pocketing;Follow solids with liquid Postural Changes --   CHL IP OTHER RECOMMENDATIONS 06/11/2021 Recommended Consults -- Oral Care Recommendations Oral care BID Other Recommendations --   CHL IP FOLLOW UP RECOMMENDATIONS 06/11/2021 Follow up Recommendations Home health SLP   CHL IP FREQUENCY AND DURATION 06/11/2021 Speech Therapy Frequency (ACUTE ONLY) min 3x week Treatment Duration 2 weeks      CHL IP ORAL PHASE 06/11/2021 Oral Phase Impaired Oral - Pudding Teaspoon -- Oral - Pudding Cup -- Oral - Honey Teaspoon -- Oral - Honey Cup -- Oral - Nectar Teaspoon Reduced posterior propulsion;Delayed oral transit;Lingual pumping;Weak lingual manipulation Oral - Nectar Cup Lingual pumping;Delayed oral transit;Reduced posterior propulsion;Weak lingual manipulation Oral - Nectar Straw Delayed oral transit;Reduced posterior propulsion;Lingual pumping;Weak lingual manipulation Oral - Thin Teaspoon Delayed oral transit;Lingual pumping;Reduced posterior propulsion;Weak lingual manipulation Oral - Thin Cup -- Oral - Thin Straw Lingual pumping;Reduced posterior propulsion;Delayed oral transit;Weak lingual manipulation Oral - Puree Weak lingual manipulation;Lingual pumping;Delayed oral transit;Reduced posterior propulsion Oral - Mech Soft Impaired mastication;Delayed oral transit;Weak lingual manipulation;Lingual pumping;Reduced posterior propulsion Oral - Regular -- Oral - Multi-Consistency -- Oral - Pill -- Oral Phase - Comment --  CHL IP  PHARYNGEAL PHASE 06/11/2021 Pharyngeal Phase Impaired Pharyngeal- Pudding  Teaspoon -- Pharyngeal -- Pharyngeal- Pudding Cup -- Pharyngeal -- Pharyngeal- Honey Teaspoon -- Pharyngeal -- Pharyngeal- Honey Cup -- Pharyngeal -- Pharyngeal- Nectar Teaspoon Delayed swallow initiation-vallecula Pharyngeal Material does not enter airway Pharyngeal- Nectar Cup Delayed swallow initiation-pyriform sinuses Pharyngeal -- Pharyngeal- Nectar Straw Delayed swallow initiation-pyriform sinuses Pharyngeal Material does not enter airway Pharyngeal- Thin Teaspoon Delayed swallow initiation-vallecula Pharyngeal -- Pharyngeal- Thin Cup -- Pharyngeal -- Pharyngeal- Thin Straw Delayed swallow initiation-pyriform sinuses Pharyngeal -- Pharyngeal- Puree Delayed swallow initiation-vallecula Pharyngeal -- Pharyngeal- Mechanical Soft (had to suction from oral cavity) Pharyngeal -- Pharyngeal- Regular -- Pharyngeal -- Pharyngeal- Multi-consistency -- Pharyngeal -- Pharyngeal- Pill -- Pharyngeal -- Pharyngeal Comment --  CHL IP CERVICAL ESOPHAGEAL PHASE 06/11/2021 Cervical Esophageal Phase WFL Pudding Teaspoon -- Pudding Cup -- Honey Teaspoon -- Honey Cup -- Nectar Teaspoon -- Nectar Cup -- Nectar Straw -- Thin Teaspoon -- Thin Cup -- Thin Straw -- Puree -- Mechanical Soft -- Regular -- Multi-consistency -- Pill -- Cervical Esophageal Comment -- PAYNE, COURTNEY 06/11/2021, 3:55 PM  Feliberto Gottron, MA, CCC-SLP [phone]             Recent Labs    06/12/21 0545  WBC 5.9  HGB 12.6*  HCT 39.9  PLT 437*   Recent Labs    06/12/21 0545  NA 134*  K 3.6  CL 98  CO2 28  GLUCOSE 134*  BUN 13  CREATININE 0.81  CALCIUM 9.1     Intake/Output Summary (Last 24 hours) at 06/13/2021 0930 Last data filed at 06/13/2021 4034 Gross per 24 hour  Intake 180 ml  Output --  Net 180 ml         Physical Exam: Vital Signs Blood pressure 111/76, pulse 99, temperature 98.7 F (37.1 C), temperature source Oral, resp. rate 18, height 5\' 5"  (1.651 m), weight 63 kg, SpO2 100 %. Constitutional: No distress . Vital signs  reviewed. HEENT: EOMI, oral membranes moist, NGT Neck: supple Cardiovascular: RRR without murmur. No JVD    Respiratory/Chest: CTA Bilaterally without wheezes or rales. Normal effort    GI/Abdomen: BS +, non-tender, non-distended Ext: no clubbing, cyanosis, or edema Psych: pleasant and cooperative  Neurological:    Comments: speech MUCH clearer. Improved volume. Left central 7. More alert.  follows basic commands. Left inattention. Dense left hemiparesis without change. RUE and RLE 4/5. Decreased LT Left side    Assessment/Plan: 1. Functional deficits which require 3+ hours per day of interdisciplinary therapy in a comprehensive inpatient rehab setting. Physiatrist is providing close team supervision and 24 hour management of active medical problems listed below. Physiatrist and rehab team continue to assess barriers to discharge/monitor patient progress toward functional and medical goals  Care Tool:  Bathing    Body parts bathed by patient: Left arm, Chest, Abdomen, Face, Right upper leg, Left upper leg   Body parts bathed by helper: Right arm     Bathing assist Assist Level: Total Assistance - Patient < 25%     Upper Body Dressing/Undressing Upper body dressing   What is the patient wearing?: Pull over shirt    Upper body assist Assist Level: 2 Helpers    Lower Body Dressing/Undressing Lower body dressing      What is the patient wearing?: Pants     Lower body assist Assist for lower body dressing: 2 Helpers     Toileting Toileting    Toileting assist Assist for toileting: Dependent - Patient 0%  Transfers Chair/bed transfer  Transfers assist     Chair/bed transfer assist level: 2 Helpers     Locomotion Ambulation   Ambulation assist   Ambulation activity did not occur: Safety/medical concerns          Walk 10 feet activity   Assist  Walk 10 feet activity did not occur: Safety/medical concerns        Walk 50 feet  activity   Assist Walk 50 feet with 2 turns activity did not occur: Safety/medical concerns         Walk 150 feet activity   Assist Walk 150 feet activity did not occur: Safety/medical concerns         Walk 10 feet on uneven surface  activity   Assist Walk 10 feet on uneven surfaces activity did not occur: Safety/medical concerns         Wheelchair     Assist Will patient use wheelchair at discharge?: Yes Type of Wheelchair: Manual    Wheelchair assist level: Dependent - Patient 0% Max wheelchair distance: 150    Wheelchair 50 feet with 2 turns activity    Assist        Assist Level: Dependent - Patient 0%   Wheelchair 150 feet activity     Assist      Assist Level: Dependent - Patient 0%   Blood pressure 111/76, pulse 99, temperature 98.7 F (37.1 C), temperature source Oral, resp. rate 18, height 5\' 5"  (1.651 m), weight 63 kg, SpO2 100 %.    Medical Problem List and Plan: 1.  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              -patient may shower             -ELOS/Goals: MinA 3-4weeks  -Continue CIR therapies including PT, OT, and SLP      2.  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 Wife prefers no oral anticoagulation given hematoma.  -DVT/anticoagulation:  Mechanical:  Antiembolism stockings, knee (TED hose) Bilateral lower extremities             -antiplatelet therapy: N/A due to concerns of CAA 3. Pain Management: N/A.  4. Mood/arousal:  has been more alert on amantadine---continue for short term  -continue ritalin 5mg  bid             -antipsychotic agents: N/A 5. Neuropsych: This patient is not fully capable of making decisions on his own behalf. 6. Skin/Wound Care: Routine pressure relief measures. 7. Fluids/Electrolytes/Nutrition: Monitor I/O . 8. Recurrent aspiration event:    -unasyn started 6/20---stopped 6/30             --  Wbc's 8k             --CXR  recently stable.   -continue aspiration precautions, sounds better 7/2-3 9. Leucocytosis: resolved 10. Blood pressure: Monitor BID--controlled without meds at this time. 11. Progressive cognitive decline: To reschedule dementia work up at [facility] after d/c. 12. Hyperglycemia: Hgb A1C- 5.8 and BS mildly elevated due to tube feeds. Continue lantus 5 units daily for now.             --Monitor BS every  4 hours and use SSI for elevated  BS  -increased lantus to 8u daily---controlled 7/3  CBG (last 3)  Recent Labs    06/12/21 1635 06/12/21 2109 06/13/21 0609  GLUCAP 75 142* 137*    13. Dysphagia: MBS this morning. D1/thins diet  initiated!Marland Kitchen             --oral care every 4 hours while awake.  -have changed TF's to night to help stimulate PO intake  7/2 -megace trial started  7/3 told him again he would have to start eating to have NGT removed   -ate 50% of breakfast this morning! 14. Tachycardia: 90's - monitor HR TID 15. Elevated liver transaminases: Liver u/s with one small cystic structure.   -hep panel negative, hemachromatosis negative  -LFT's demonstrate ipmrovement 6/29---recheck Tuesday -may have been d/t abx/combo, tylenol which have been stopped -Ferritin elevated but Hgb, Fe++ and TIBC all low which point towards anemia of chronic disease.      LOS: 9 days A FACE TO FACE EVALUATION WAS PERFORMED  Ranelle Oyster 06/13/2021, 9:30 AM

## 2021-06-14 DIAGNOSIS — I610 Nontraumatic intracerebral hemorrhage in hemisphere, subcortical: Secondary | ICD-10-CM

## 2021-06-14 LAB — GLUCOSE, CAPILLARY
Glucose-Capillary: 100 mg/dL — ABNORMAL HIGH (ref 70–99)
Glucose-Capillary: 104 mg/dL — ABNORMAL HIGH (ref 70–99)
Glucose-Capillary: 90 mg/dL (ref 70–99)
Glucose-Capillary: 110 mg/dL — ABNORMAL HIGH (ref 70–99)

## 2021-06-14 MED ORDER — OSMOLITE 1.5 CAL PO LIQD
480.0000 mL | ORAL | Status: DC
  Administered 2021-06-14: 480 mL
  Filled 2021-06-14: qty 711

## 2021-06-14 NOTE — Progress Notes (Signed)
Speech Language Pathology Daily Session Note  Patient Details  Name: Donald [name] MRN: [id number] Date of Birth: 1956-10-10  Today's Date: 06/14/2021 SLP Individual Time: 4403-4742 SLP Individual Time Calculation (min): 45 min  Short Term Goals: Week 2: SLP Short Term Goal 1 (Week 2): Patient will consume current diet with minimal overt s/s of aspiration and Mod verbal cues for use of swallowing compensatory strategies. SLP Short Term Goal 2 (Week 2): Patient will locate/attend to left field of enviornment in 50% of opportunities with Max A verbal cues. SLP Short Term Goal 3 (Week 2): Patient will orient to place, time and situation with Max visual aids. SLP Short Term Goal 4 (Week 2): Patient will demonstrate sustained attention to functional tasks for 5 minutes with Mod verbal cues for recdirection. SLP Short Term Goal 5 (Week 2): Patient will utilize speech intelligibility strategies to achieve ~75% intelligibility at the phrase level with Min verbal cues.  Skilled Therapeutic Interventions: Skilled ST intervention performed with focus on cognition and dysphagia. Max A verbal and visual cues to orient to place and time. Utilized Public affairs consultant across from bed to identify date and location. Patient consumed sips of thin liquid through straw with timely swallow initiation. Bolus holding occurred during one occasion which appeared attributed to re-positioning in bed with bolus in mouth. No overt signs or symptoms of aspiration noted among all straw sips (x6). Patient was perceived as approximately 50% intelligible without use of speech intelligibility strategies, and 75% intelligible with mod A verbal cues and reminders to enhance vocal intensity. Patient was left in bed with alarm activated and call bell within reach. Facilitated visual scanning to left requiring max verbal, tactile, and visual cues to attend to left during 10% of occasions. Continue per ST POC.     Pain Pain  Assessment Pain Scale: 0-10 Pain Score: 0-No pain  Therapy/Group: Individual Therapy  Tamala Ser 06/14/2021, 8:38 AM

## 2021-06-14 NOTE — Progress Notes (Signed)
PROGRESS NOTE   Subjective/Complaints:   Per wife pt states he is full for breakfast  Discussed neurostim meds and appetite stim medication   ROS: Limited due to cognitive/behavioral    Objective:   No results found. Recent Labs    06/12/21 0545  WBC 5.9  HGB 12.6*  HCT 39.9  PLT 437*    Recent Labs    06/12/21 0545  NA 134*  K 3.6  CL 98  CO2 28  GLUCOSE 134*  BUN 13  CREATININE 0.81  CALCIUM 9.1      Intake/Output Summary (Last 24 hours) at 06/14/2021 1141 Last data filed at 06/14/2021 0700 Gross per 24 hour  Intake 358 ml  Output --  Net 358 ml          Physical Exam: Vital Signs Blood pressure 123/80, pulse 97, temperature 98 F (36.7 C), resp. rate 18, height 5\' 5"  (1.651 m), weight 60.2 kg, SpO2 100 %.  General: No acute distress Mood and affect are appropriate Heart: Regular rate and rhythm no rubs murmurs or extra sounds Lungs: Clear to auscultation, breathing unlabored, no rales or wheezes Abdomen: Positive bowel sounds, soft nontender to palpation, nondistended Extremities: No clubbing, cyanosis, or edema Skin: No evidence of breakdown, no evidence of rash   Neurological:    Comments: speech MUCH clearer. Improved volume. Left central 7. More alert.  follows basic commands. Left inattention. Dense left hemiparesis without change. RUE and RLE 4/5. Decreased LT Left side    Assessment/Plan: 1. Functional deficits which require 3+ hours per day of interdisciplinary therapy in a comprehensive inpatient rehab setting. Physiatrist is providing close team supervision and 24 hour management of active medical problems listed below. Physiatrist and rehab team continue to assess barriers to discharge/monitor patient progress toward functional and medical goals  Care Tool:  Bathing    Body parts bathed by patient: Face   Body parts bathed by helper: Right arm, Left arm, Chest, Abdomen,  Front perineal area, Buttocks, Right upper leg, Left upper leg, Right lower leg, Left lower leg     Bathing assist Assist Level: 2 Helpers     Upper Body Dressing/Undressing Upper body dressing   What is the patient wearing?: Pull over shirt    Upper body assist Assist Level: 2 Helpers    Lower Body Dressing/Undressing Lower body dressing      What is the patient wearing?: Pants     Lower body assist Assist for lower body dressing: 2 Helpers     Toileting Toileting    Toileting assist Assist for toileting: 2 Helpers     Transfers Chair/bed transfer  Transfers assist     Chair/bed transfer assist level: 2 Helpers     Locomotion Ambulation   Ambulation assist   Ambulation activity did not occur: Safety/medical concerns          Walk 10 feet activity   Assist  Walk 10 feet activity did not occur: Safety/medical concerns        Walk 50 feet activity   Assist Walk 50 feet with 2 turns activity did not occur: Safety/medical concerns         Walk 150  feet activity   Assist Walk 150 feet activity did not occur: Safety/medical concerns         Walk 10 feet on uneven surface  activity   Assist Walk 10 feet on uneven surfaces activity did not occur: Safety/medical concerns         Wheelchair     Assist Will patient use wheelchair at discharge?: Yes Type of Wheelchair: Manual    Wheelchair assist level: Dependent - Patient 0% Max wheelchair distance: 150    Wheelchair 50 feet with 2 turns activity    Assist        Assist Level: Dependent - Patient 0%   Wheelchair 150 feet activity     Assist      Assist Level: Dependent - Patient 0%   Blood pressure 123/80, pulse 97, temperature 98 F (36.7 C), resp. rate 18, height 5\' 5"  (1.651 m), weight 60.2 kg, SpO2 100 %.    Medical Problem List and Plan: 1.  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               -patient may shower             -ELOS/Goals: MinA 3-4weeks  -Continue CIR therapies including PT, OT, and SLP      2.  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 Wife prefers no oral anticoagulation given hematoma.  -DVT/anticoagulation:  Mechanical:  Antiembolism stockings, knee (TED hose) Bilateral lower extremities             -antiplatelet therapy: N/A due to concerns of CAA 3. Pain Management: N/A.  4. Mood/arousal:  has been more alert on amantadine---continue for short term  -continue ritalin 5mg  bid             -antipsychotic agents: N/A 5. Neuropsych: This patient is not fully capable of making decisions on his own behalf. 6. Skin/Wound Care: Routine pressure relief measures. 7. Fluids/Electrolytes/Nutrition: Monitor I/O . 8. Recurrent aspiration event:    -unasyn started 6/20---stopped 6/30             --  Wbc's 8k             --CXR recently stable.   -continue aspiration precautions, sounds better 7/2-3 9. Leucocytosis: resolved 10. Blood pressure: Monitor BID--controlled without meds at this time. Vitals:   06/13/21 1929 06/14/21 0448  BP: 114/75 123/80  Pulse: (!) 102 97  Resp: 18 18  Temp: 98.1 F (36.7 C) 98 F (36.7 C)  SpO2: 100% 100%   Controlled 7/4 11. Progressive cognitive decline: To reschedule dementia work up at [facility] after d/c. 12. Hyperglycemia: Hgb A1C- 5.8 and BS mildly elevated due to tube feeds. Continue lantus 5 units daily for now.             --Monitor BS every  4 hours and use SSI for elevated  BS  -increased lantus to 8u daily---controlled 7/3  CBG (last 3)  Recent Labs    06/13/21 2104 06/14/21 0618 06/14/21 1128  GLUCAP 117* 100* 104*    Controlled 7/4 13. Dysphagia: MBS this morning. D1/thins diet initiated!08/15/21             --oral care every 4 hours while awake.  -have changed TF's to night to help stimulate PO intake  7/2 -megace trial started  7/3 told him again he would have to start eating to have NGT  removed   -ate 50% of breakfast this  morning! Will stop TF at 5a to increase appetite for breakfast  14. Tachycardia: 90's - monitor HR TID 15. Elevated liver transaminases: Liver u/s with one small cystic structure.   -hep panel negative, hemachromatosis negative  -LFT's demonstrate ipmrovement 6/29---recheck Tuesday -may have been d/t abx/combo, tylenol which have been stopped -Ferritin elevated but Hgb, Fe++ and TIBC all low which point towards anemia of chronic disease.      LOS: 10 days A FACE TO FACE EVALUATION WAS PERFORMED  Erick Colace 06/14/2021, 11:41 AM

## 2021-06-14 NOTE — Progress Notes (Signed)
Physical Therapy Session Note  Patient Details  Name: Donald [name] MRN: [id number] Date of Birth: 24-Jul-1956  Today's Date: 06/14/2021 PT Individual Time: 1300-1400 PT Individual Time Calculation (min): 60 min   Short Term Goals: Week 2:  PT Short Term Goal 1 (Week 2): Pt will perform bed<>WC transfer with max assist of 1 PT Short Term Goal 2 (Week 2): Pt will perform gait training at rail in hall x 67ft with max A +2 PT Short Term Goal 3 (Week 2): Pt will perform WC propulsion x13ft and mod assist using hemi technique PT Short Term Goal 4 (Week 2): Pt will perform sitting balance EOB with mod assist up to 5 minutes  Skilled Therapeutic Interventions/Progress Updates: Pt presents almost sitting up w/ bed controls, just completed eating lunch.  Pt responding to PT and agreeable to PT.  Pt rolls to left side after hooklying position attained (active RLE, Manual positioning LLE by PT) w/ max A.  Pt initiates Les off EOB, but then somewhat resistant 2/2 c/o LE pain.  Pt the completed sidelying to sit w/ Total A and R UE on rail.  Pt then assisted to sitting EOB feet flat, w/ flexed posture.  Verbal cueing for upright posture w/ visual cues (look out the door, look at the ceiling), but only able to maintain x 30 secs.  Pt cued for attention to Left side.Pt performed sit to stand w/ total A and blocking of L knee and then SPT bed > w/c.  Pt assisted for positioning in TIS.  Pt wheeled to main gym for energy conservation.  Pt transferred sit to stand and SPT w/ assist x 2 w/c > mat table.  Pt w/ improved sitting posture, T-ball in back to increase chest expansion.  PT facilitating L UE WB through hand, elbow and shoulder w/ initial c/o pain L wrist, but improved stretch noted into extension as continued.  Pt reaching for cones w/ R hand to left and placing on mat table next to him on right and then onto higher table to Right outside of BOS.  Pt performed lifting beach ball overhead and then forward outside  of BOS to tap mirror x 5.  Pt w/ noted fatigue.  Pt returned to TIS w/ SPT, blocking L knee.  Gentle stretch to C/spine out of torticollis, w/ c/o soreness.  Pt returned to room and reclined in TIS, chair alarm on and all needs in reach.  Nsg notified.     Therapy Documentation Precautions:  Precautions Precautions: Fall Precaution Comments: L hemiparesis, L inattention, pusher to the L, cognitive deficits, cortrak Restrictions Weight Bearing Restrictions: No General:   Vital Signs:   Pain:Pt states 0/10 pain initially, but w/ activity, including gentle wrist extension, c/o pain L wrist and LE.       Therapy/Group: Individual Therapy  Lucio Edward 06/14/2021, 2:04 PM

## 2021-06-14 NOTE — Progress Notes (Signed)
Occupational Therapy Session Note  Patient Details  Name: Donald [name] MRN: [id number] Date of Birth: 1956/12/09  Today's Date: 06/14/2021 OT Individual Time: 7829-5621 OT Individual Time Calculation (min): 71 min    Skilled Therapeutic Interventions/Progress Updates:    Pt greeted in bed with no c/o pain initially. Agreeable to engage in ADLs today. Since no +2 available for majority of session, bathing/dressing tasks were completed bedlevel utilizing chair position to improve postural control and postural awareness. Supervision for face washing and pt then required step by step verbal + hand over hand cues to wash other body areas that he was able to reach. He needed assistance for thoroughness when washing all areas excluding face. Really focused on pt processing instruction and then following basic 1 step instruction. Increased time for all processing today. +2 assist for flexing trunk forward to wash back and pull shirt over trunk. +2 also for perihygiene + brief change with pt having bowel incontinence in brief. He wasn't able to tolerate figure 4 position due to hip tightness bilaterally, worked on maintaining modified figure 4 to work on functional stretching. Around this time, pts spouse Lentine arrived. OT showed her gentle PROM to complete with pt for shoulder, elbow, wrist, forearm supinators, and digits. Education emphasis was placed on joint protection, including pt in regards to visually attending to his limb, touching affected limb, and counting aloud for reps/stretch holds. Note that pt required mod cues and increased time to scan for ADL items presented on his Lt side, very limited ability to rotate his neck towards the Lt side. Total A to correct Rt lateral LOBs, which was frequent. He remained sitting up with all needs within reach on his Rt side, hemiplegic side protected, and bed alarm set.   Therapy Documentation Precautions:  Precautions Precautions: Fall Precaution  Comments: L hemiparesis, L inattention, pusher to the L, cognitive deficits, cortrak Restrictions Weight Bearing Restrictions: No  Pain: in the spastic Lt UE at times during ROM, exercises modified to minimize pain. He did not want any pain medicine from RN when asked about this Pain Assessment Pain Scale: 0-10 Pain Score: 0-No pain ADL: ADL Eating: NPO Grooming: Dependent Upper Body Bathing: Dependent Where Assessed-Upper Body Bathing: Bed level Lower Body Bathing: Dependent Where Assessed-Lower Body Bathing: Bed level Upper Body Dressing: Dependent Where Assessed-Upper Body Dressing: Bed level Lower Body Dressing: Dependent Where Assessed-Lower Body Dressing: Bed level Toileting: Dependent Where Assessed-Toileting: Bed level Toilet Transfer: Not assessed Tub/Shower Transfer: Not assessed Walk-In Shower Transfer: Not assessed      Therapy/Group: Individual Therapy  Tionne Carelli A Arrianna Catala 06/14/2021, 11:14 AM

## 2021-06-14 NOTE — Progress Notes (Signed)
Occupational Therapy Session Note  Patient Details  Name: Donald [name] MRN: [id number] Date of Birth: 12-13-1955  Today's Date: 06/14/2021 OT Individual Time: 1430-1500 OT Individual Time Calculation (min): 30 min    Short Term Goals: Week 2:  OT Short Term Goal 1 (Week 2): Pt will don shirt with max A in supported sitting. OT Short Term Goal 2 (Week 2): Pt will maintain midline orientation in supported sitting with overall max A. OT Short Term Goal 3 (Week 2): Pt will completed grooming task with mod A in supported sitting.  Skilled Therapeutic Interventions/Progress Updates:    Patient seated in TIS w/c, alert and anxious to get back to bed.  Wife present for therapy session.  Patient with pain in left hip, knee and shoulder with repositioning.  Note moderate flexion tightness t/o left side.  SB transfer w/c to bed with max A of one and stand by of +2.  Unsupported sitting CS with flexed posture - requires mod A for anterior pelvic tilt, midline orientation, head and shoulder positioning.  Significant inattention to left side.  Able to located and focus on objects on left side with mod cues.  Sit to supine with max A of one, +2 to position toward HOB.  Completed inhibition activities for flexors on left with good ROM and response to positioning and light stretch.  He remained in bed at close of session, bed alarm set and call bell in hand.    Therapy Documentation Precautions:  Precautions Precautions: Fall Precaution Comments: L hemiparesis, L inattention, pusher to the L, cognitive deficits, cortrak Restrictions Weight Bearing Restrictions: No   Therapy/Group: Individual Therapy  Barrie Lyme 06/14/2021, 7:45 AM

## 2021-06-15 LAB — COMPREHENSIVE METABOLIC PANEL
ALT: 59 U/L — ABNORMAL HIGH (ref 0–44)
AST: 28 U/L (ref 15–41)
Albumin: 2.7 g/dL — ABNORMAL LOW (ref 3.5–5.0)
Alkaline Phosphatase: 212 U/L — ABNORMAL HIGH (ref 38–126)
Anion gap: 5 (ref 5–15)
BUN: 14 mg/dL (ref 8–23)
CO2: 26 mmol/L (ref 22–32)
Calcium: 9.1 mg/dL (ref 8.9–10.3)
Chloride: 105 mmol/L (ref 98–111)
Creatinine, Ser: 0.86 mg/dL (ref 0.61–1.24)
GFR, Estimated: >60 mL/min (ref >60)
Glucose, Bld: 122 mg/dL — ABNORMAL HIGH (ref 70–99)
Potassium: 3.5 mmol/L (ref 3.5–5.1)
Sodium: 136 mmol/L (ref 135–145)
Total Bilirubin: 0.6 mg/dL (ref 0.3–1.2)
Total Protein: 7.1 g/dL (ref 6.5–8.1)

## 2021-06-15 LAB — CBC
HCT: 40.2 % (ref 39.0–52.0)
Hemoglobin: 12.9 g/dL — ABNORMAL LOW (ref 13.0–17.0)
MCH: 29.7 pg (ref 26.0–34.0)
MCHC: 32.1 g/dL (ref 30.0–36.0)
MCV: 92.4 fL (ref 80.0–100.0)
Platelets: 396 10*3/uL (ref 150–400)
RBC: 4.35 MIL/uL (ref 4.22–5.81)
RDW: 13.7 % (ref 11.5–15.5)
WBC: 5.6 10*3/uL (ref 4.0–10.5)
nRBC: 0 % (ref 0.0–0.2)

## 2021-06-15 LAB — GLUCOSE, CAPILLARY
Glucose-Capillary: 122 mg/dL — ABNORMAL HIGH (ref 70–99)
Glucose-Capillary: 124 mg/dL — ABNORMAL HIGH (ref 70–99)
Glucose-Capillary: 111 mg/dL — ABNORMAL HIGH (ref 70–99)
Glucose-Capillary: 97 mg/dL (ref 70–99)

## 2021-06-15 MED ORDER — FREE WATER
150.0000 mL | Status: DC
  Administered 2021-06-15 – 2021-06-17 (×11): 150 mL

## 2021-06-15 MED ORDER — ENSURE ENLIVE PO LIQD
237.0000 mL | Freq: Three times a day (TID) | ORAL | Status: DC
  Administered 2021-06-15 – 2021-06-19 (×8): 237 mL via ORAL
  Filled 2021-06-15: qty 237

## 2021-06-15 MED ORDER — NALOXONE HCL 2 MG/2ML IJ SOSY
PREFILLED_SYRINGE | INTRAMUSCULAR | Status: AC
  Filled 2021-06-15: qty 2

## 2021-06-15 NOTE — Progress Notes (Signed)
Occupational Therapy Session Note  Patient Details  Name: Donald [name] MRN: [id number] Date of Birth: 09/19/1956  Today's Date: 06/15/2021 OT Individual Time: 7482-7078  &   1300-1330 OT Individual Time Calculation (min): 67 min  &  30 min   Short Term Goals: Week 2:  OT Short Term Goal 1 (Week 2): Pt will don shirt with max A in supported sitting. OT Short Term Goal 2 (Week 2): Pt will maintain midline orientation in supported sitting with overall max A. OT Short Term Goal 3 (Week 2): Pt will completed grooming task with mod A in supported sitting.  Skilled Therapeutic Interventions/Progress Updates:    AM session:   Patient in bed, alert - pleasant and cooperative.  He notes some pain in lower abdomin.  Able to feed himself breakfast (to include scooping, spoon to mouth and cup to mouth) with set up and increased time (approx 50%) with items on right side of tray.  Completed lower body dressing and change of wet brief at bed level - he assists with pulling pants over thighs overall requiring max/dep assist.  Rolling in bed with max A due to left hip pain.  Side lying to sitting edge of bed with max A.  He is able to sit with flexed posture CS/min A - mod A for upright trunk/head posture.  OH shirt max A.  Oral care min A.  Max A to return to supine position in bed.  He remained in bed at close of session, bed alarm set and call bell in hand.     PM session:   Patient seated in w/c, alert and states that he would like a change of position.  SB transfer w/c to bed max A of one with stand by of +2.  Completed unsupported sitting with focus on trunk mobility, posture, balance, left side awareness, weight bearing L UE, PROM, inhibition of flexors.  Sit to supine max A.  Assisted with pants down and positioning on bed pan due to need to void at close of session (brief dry)  nurse tech aware.  He remained in bed, bed alarm set, call bell in hand.       Therapy Documentation Precautions:   Precautions Precautions: Fall Precaution Comments: L hemiparesis, L inattention, pusher to the L, cognitive deficits, cortrak Restrictions Weight Bearing Restrictions: No   Therapy/Group: Individual Therapy  Barrie Lyme 06/15/2021, 7:30 AM

## 2021-06-15 NOTE — Progress Notes (Signed)
PROGRESS NOTE   Subjective/Complaints: Pt up at EOB. Ate approximately 50% breakfast today. Sitting EOB with OT.   ROS: Limited due to cognitive/behavioral   Objective:   No results found. Recent Labs    06/15/21 0539  WBC 5.6  HGB 12.9*  HCT 40.2  PLT 396   Recent Labs    06/15/21 0539  NA 136  K 3.5  CL 105  CO2 26  GLUCOSE 122*  BUN 14  CREATININE 0.86  CALCIUM 9.1     Intake/Output Summary (Last 24 hours) at 06/15/2021 1051 Last data filed at 06/15/2021 0926 Gross per 24 hour  Intake 520 ml  Output --  Net 520 ml         Physical Exam: Vital Signs Blood pressure 112/68, pulse 100, temperature 98.2 F (36.8 C), temperature source Oral, resp. rate 16, height 5\' 5"  (1.651 m), weight 61.4 kg, SpO2 100 %.  Constitutional: No distress . Vital signs reviewed. HEENT: EOMI, oral membranes moist Neck: supple Cardiovascular: RRR without murmur. No JVD    Respiratory/Chest: CTA Bilaterally without wheezes or rales. Normal effort    GI/Abdomen: BS +, non-tender, non-distended Ext: no clubbing, cyanosis, or edema Psych: pleasant and cooperative  Neurological:    Comments: loud, clear voice.  Left central 7. More alert.  follows basic commands. Obvious STM deficits. Left inattention. Dense left hemiparesis without change. RUE and RLE 4/5. Decreased LT Left side    Assessment/Plan: 1. Functional deficits which require 3+ hours per day of interdisciplinary therapy in a comprehensive inpatient rehab setting. Physiatrist is providing close team supervision and 24 hour management of active medical problems listed below. Physiatrist and rehab team continue to assess barriers to discharge/monitor patient progress toward functional and medical goals  Care Tool:  Bathing    Body parts bathed by patient: Face   Body parts bathed by helper: Right arm, Left arm, Chest, Abdomen, Front perineal area, Buttocks, Right  upper leg, Left upper leg, Right lower leg, Left lower leg     Bathing assist Assist Level: 2 Helpers     Upper Body Dressing/Undressing Upper body dressing   What is the patient wearing?: Pull over shirt    Upper body assist Assist Level: 2 Helpers    Lower Body Dressing/Undressing Lower body dressing      What is the patient wearing?: Pants     Lower body assist Assist for lower body dressing: 2 Helpers     Toileting Toileting    Toileting assist Assist for toileting: 2 Helpers     Transfers Chair/bed transfer  Transfers assist     Chair/bed transfer assist level: 2 Helpers     Locomotion Ambulation   Ambulation assist   Ambulation activity did not occur: Safety/medical concerns          Walk 10 feet activity   Assist  Walk 10 feet activity did not occur: Safety/medical concerns        Walk 50 feet activity   Assist Walk 50 feet with 2 turns activity did not occur: Safety/medical concerns         Walk 150 feet activity   Assist Walk 150 feet activity did  not occur: Safety/medical concerns         Walk 10 feet on uneven surface  activity   Assist Walk 10 feet on uneven surfaces activity did not occur: Safety/medical concerns         Wheelchair     Assist Will patient use wheelchair at discharge?: Yes Type of Wheelchair: Manual    Wheelchair assist level: Dependent - Patient 0% Max wheelchair distance: 150    Wheelchair 50 feet with 2 turns activity    Assist        Assist Level: Dependent - Patient 0%   Wheelchair 150 feet activity     Assist      Assist Level: Dependent - Patient 0%   Blood pressure 112/68, pulse 100, temperature 98.2 F (36.8 C), temperature source Oral, resp. rate 16, height 5\' 5"  (1.651 m), weight 61.4 kg, SpO2 100 %.    Medical Problem List and Plan: 1.  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               -patient may shower             -ELOS/Goals: MinA 3-4weeks  -Continue CIR therapies including PT, OT, and SLP       2.  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 Wife prefers no oral anticoagulation given hematoma.  -DVT/anticoagulation:  Mechanical:  Antiembolism stockings, knee (TED hose) Bilateral lower extremities             -antiplatelet therapy: N/A due to concerns of CAA 3. Pain Management: N/A.  4. Mood/arousal:    -continue amantadine and ritalin 5mg  bid for arousal/initiaton             -antipsychotic agents: N/A 5. Neuropsych: This patient is not fully capable of making decisions on his own behalf. 6. Skin/Wound Care: Routine pressure relief measures. 7. Fluids/Electrolytes/Nutrition: Monitor I/O . 8. Recurrent aspiration event:    -unasyn started 6/20---stopped 6/30             --  Wbc's 8k             --CXR recently stable.   -continue aspiration precautions, sounds better 7/2-3 9. Leucocytosis: resolved 10. Blood pressure: Monitor BID--controlled without meds at this time. Vitals:   06/14/21 1944 06/15/21 0540  BP: 125/89 112/68  Pulse: (!) 102 100  Resp: 14 16  Temp: 98.1 F (36.7 C) 98.2 F (36.8 C)  SpO2: 100% 100%   Controlled 7/5 11. Progressive cognitive decline: To reschedule dementia work up at [facility] after d/c. 12. Hyperglycemia: Hgb A1C- 5.8 and BS mildly elevated due to tube feeds. Continue lantus 5 units daily for now.             --Monitor BS every  4 hours and use SSI for elevated  BS  -increased lantus to 8u daily---controlled 7/3  CBG (last 3)  Recent Labs    06/14/21 1653 06/14/21 2117 06/15/21 0626  GLUCAP 90 110* 122*   Controlled 7/4 13. Dysphagia: MBS this morning. D1/thins diet initiated!2118             --oral care every 4 hours while awake.  -have changed TF's to night to help stimulate PO intake  7/2 -megace trial started  7/5 eating better, around 50% a lot of meals -dc TF except for flushes -if continued improved  intake, will dc NGT tomorrow or Thursday 14. Tachycardia: 90's - monitor HR TID 15.  Elevated liver transaminases: Liver u/s with one small cystic structure.   -all LFT's nearly back wnl  16. Mild anemia-Ferritin elevated but Hgb, Fe++ and TIBC all low which point towards anemia of chronic disease.      LOS: 11 days A FACE TO FACE EVALUATION WAS PERFORMED  Ranelle Oyster 06/15/2021, 10:51 AM

## 2021-06-15 NOTE — Progress Notes (Signed)
Patient ID: Donald [name], male   DOB: [birth year], 65 y.o.   MRN: [id number] Spoke with wife via telephone to discuss team conference update on husband's progress this week. She feels he is getting too much tube feedings and this is the reason he can not eat much of the food due to he is already full. She will address with bedside RN and MD. Made aware of target discharge date of 7/22. She is pleased with his progress this week. Continue to follow and work on discharge needs.

## 2021-06-15 NOTE — Progress Notes (Signed)
Speech Language Pathology Daily Session Note  Patient Details  Name: Donald [name] MRN: [id number] Date of Birth: Jun 29, 1956  Today's Date: 06/15/2021 SLP Individual Time: 1415-1455 SLP Individual Time Calculation (min): 40 min  Short Term Goals: Week 2: SLP Short Term Goal 1 (Week 2): Patient will consume current diet with minimal overt s/s of aspiration and Mod verbal cues for use of swallowing compensatory strategies. SLP Short Term Goal 2 (Week 2): Patient will locate/attend to left field of enviornment in 50% of opportunities with Max A verbal cues. SLP Short Term Goal 3 (Week 2): Patient will orient to place, time and situation with Max visual aids. SLP Short Term Goal 4 (Week 2): Patient will demonstrate sustained attention to functional tasks for 5 minutes with Mod verbal cues for recdirection. SLP Short Term Goal 5 (Week 2): Patient will utilize speech intelligibility strategies to achieve ~75% intelligibility at the phrase level with Min verbal cues.  Skilled Therapeutic Interventions: Skilled treatment session focused on dysphagia and cognitive goals. Upon arrival, patient was awake in bed and just beginning to eat his lunch meal of Dys. 1 textures with thin liquids. Patient consumed meal without overt s/s of aspiration and with minimal oral holding. Encouragement was provided to maximize PO intake but also suspect decreased attention from discomfort in left hip while sitting upright also impacted patient's overall PO intake. Recommend patient continue current diet. SLP also facilitated session by providing Max-Total A tactile and visual cues for left visual scanning on a calendar to locate the date. Total A multimodal cues were also needed to identify important dates to the patient and his family on the calendar. Therefore, SLP made a custom calendar and placed important dates to maximize recall and carryover of functional information. Patient left supine in bed with alarm on and all  needs within reach. Continue with current plan of care.      Pain Discomfort in left hip Patient repositioned   Therapy/Group: Individual Therapy  [name] 06/15/2021, 3:15 PM

## 2021-06-15 NOTE — Progress Notes (Signed)
Physical Therapy Session Note  Patient Details  Name: Donald [name] MRN: [id number] Date of Birth: 06/26/1956  Today's Date: 06/15/2021 PT Individual Time: 6213-0865 and 7846-9629 PT Individual Time Calculation (min): 42 min and 23 min  Short Term Goals: Week 2:  PT Short Term Goal 1 (Week 2): Pt will perform bed<>WC transfer with max assist of 1 PT Short Term Goal 2 (Week 2): Pt will perform gait training at rail in hall x 64ft with max A +2 PT Short Term Goal 3 (Week 2): Pt will perform WC propulsion x39ft and mod assist using hemi technique PT Short Term Goal 4 (Week 2): Pt will perform sitting balance EOB with mod assist up to 5 minutes  Skilled Therapeutic Interventions/Progress Updates:     Pt received supine in bed and agrees to therapy. Pt initially says he does not have any pain but upon initiation of supine to sit, pt begins reporting significant pain in L anterior hip/thigh. Number not provided. PT provides rest breaks, repositioning, and mobility to manage pain. Supine to sit with maxA. Pt able to maintain sitting balance at EOB with CGA. Sit to stand and stand pivot to [facility] with maxA. WC transport to gym for time management. Squat pivot to mat table with maxA. Pt positioned with mirror for visual feedback, and exercise ball placed behind pt to allow for semifowler positioning and stretching of anterior muscle groups. PT performs PROM on bilateral lower extremities in attempt to locate source of L lower extremity pain. Pt also performs LAQs x10 with R leg and cued to attempt with L leg. PT provides majority of AAROM but notes trace activation of L quad and hip abductors. Pt attempts to stand with maxA+2 with 3 musketeers technique but is unable to achieve full upright positioning. Assisted in supine and pt performs x10 supine bridges with manual pressure through L tibia to encourage activation. Supine to sit with maxA. Stand pivot back to WC with maxA. Pt left seated in WC with alarm  intact and all needs within reach.   2nd Session: Pt received supine in flat bed and agrees to therapy. SLP present and states that pt laying flat because his L hip was in significant amount of pain in seated position and pt only found relief when laying flat. HOB raised slowly to 30 degrees. Pt then cued to perform bridge with R leg in order to abduct L leg toward edge of bed to initiate supine to sit. Pt is able to complete with rehab tech stabilizing R ankle and PT providing manual assistance to abduct L lower extremity. Once L leg clears edge of bed, PT provides cueing for body mechanics and sequencing of sidelying to sit, and provides modA to complete entire transition. Pt seated at EOB while therapist positions WC for transfer. Pt practices sideleaning on R elbow and then L elbow for practice with functional positions as well as WB through L hemiparetic upper extremity. Slideboard positioned for transfer but in process of positioning slideboard, pt reports significant pain in L anterior hip and thigh and begins to lie back down. Pt very distracted by pain and is unable to follow directions well upon initiation of pain symptoms. PT provides modA for return to supine and totalA +2 utilized for supine slide to [facility], as pt is unable to follow directions for hand placement. Pt performs bilateral rolling with maxA for chuck pad placement. Left with alarm intact and all needs within reach.  Therapy Documentation Precautions:  Precautions Precautions: Fall  Precaution Comments: L hemiparesis, L inattention, pusher to the L, cognitive deficits, cortrak Restrictions Weight Bearing Restrictions: No   Therapy/Group: Individual Therapy  Beau Fanny, PT, DPT 06/15/2021, 3:40 PM

## 2021-06-15 NOTE — Progress Notes (Signed)
Nutrition Follow-up  DOCUMENTATION CODES:   Non-severe (moderate) malnutrition in context of chronic illness  INTERVENTION:  Provide Ensure Enlive po TID, each supplement provides 350 kcal and 20 grams of protein.  Encourage adequate PO intake.   NUTRITION DIAGNOSIS:   Moderate Malnutrition related to chronic illness as evidenced by mild fat depletion, moderate muscle depletion; ongoing  GOAL:   Patient will meet greater than or equal to 90% of their needs; progressing  MONITOR:   PO intake, Supplement acceptance, Diet advancement, Skin, Weight trends, Labs, I & O's  REASON FOR ASSESSMENT:   New TF    ASSESSMENT:   65 year old right-handed male with history of OA and and progressive dementia work-up who was originally admitted on 05/11/2021 with acute left hemiparesis with sensory deficits, right gaze preference and left facial droop due to 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 CT of head done 6/12 revealing interval progression of right frontal hematoma with worsening of mass-effect and midline shift. CT brain repeated showing truncated arterial cortical branch at upper and lateral hematoma. He continues to be limited by left sided weakness with right gaze preference and inability to cross midline, cognitive deficits with delay in processing and motor planning deficits, left hemiplegia with emerging tone affecting mobility and ADLs CIR recommended due to functional decline  Pt is currently on a dysphagia 1 diet with thin liquids. Meal completion 50%. Family at bedside has been encouraging pt po intake. Pt reports decreased appetite however has been still been trying to eat his food at meals. Tube feeding discontinued. Cortrak NGT to remain in place until tomorrow or Thursday pending improvement in po intake. RD to order nutritional supplements to aid in PO.   Labs and medications reviewed.   Diet Order:   Diet Order             DIET - DYS 1 Room service  appropriate? Yes; Fluid consistency: Thin  Diet effective now                   EDUCATION NEEDS:   Not appropriate for education at this time  Skin:  Skin Assessment: Reviewed RN Assessment Skin Integrity Issues:: Other (Comment) Other: puncture R neck  Last BM:  7/4  Height:   Ht Readings from Last 1 Encounters:  06/04/21 5\' 5"  (1.651 m)    Weight:   Wt Readings from Last 1 Encounters:  06/15/21 61.4 kg   BMI:  Body mass index is 22.53 kg/m.  Estimated Nutritional Needs:   Kcal:  1850-2050  Protein:  90-110 grams  Fluid:  >/= 1.9 L/day  08/16/21, MS, RD, LDN RD pager number/after hours weekend pager number on Amion.

## 2021-06-15 NOTE — Patient Care Conference (Signed)
Inpatient RehabilitationTeam Conference and Plan of Care Update Date: 06/15/2021   Time: 10:34 AM    Patient Name: Donald [name]      Medical Record Number: [id number]  Date of Birth: February 26, 1956 Sex: Male         Room/Bed: 4W05C/4W05C-01 Payor Info: Payor: Advertising copywriter / Plan: GEHA / Product Type: *No Product type* /    Admit Date/Time:  06/04/2021  4:18 PM  Primary Diagnosis:  Intraparenchymal hematoma of brain [facility])  Hospital Problems: Principal Problem:   Intraparenchymal hematoma of brain [facility]) Active Problems:   Transaminitis    Expected Discharge Date: Expected Discharge Date: 07/02/21  Team Members Present: Physician leading conference: Dr. Faith Rogue Care Coodinator Present: Kennyth Arnold, RN, BSN, CRRN Nurse Present: Kennyth Arnold, RN PT Present: Malachi Pro, PT OT Present: Towanda Malkin, OT SLP Present: Feliberto Gottron, SLP PPS Coordinator present : Fae Pippin, SLP     Current Status/Progress Goal Weekly Team Focus  Bowel/Bladder   pt is incont of B/B. last BM-7/4  Regain continence  assess q shift and PRN. scheduled toileting.   Swallow/Nutrition/ Hydration   Dys 1 diet and thin liquids  Min A  Tolerance of Dys 1 and thin liquids and use of swallow strategies   ADL's   adl max A, bed mobility max A, SB transfer max A  Min/mod A  sitting balance, posture, L NMRE, adl and transfer training   Mobility   maxA bed mobility, maxA stand pivot and squat pivot transfers  minA to modA  gait training, increased standing activities, L NMR, balance, transfers   Communication   Mod A for speech intelligibility  Min A  improving speech intelligiblity, vocal intensity, RMT   Safety/Cognition/ Behavioral Observations  Max A  Min A  Sustained attention, attending to midline   Pain   t not complaining of pain  Patient will remain pain free  assess pain q shift and PRN   Skin   skin intact  Patient will maintain good skin integrity  assess skin q shift and  PRN.     Discharge Planning:  D/c to home with 24/7 care from wife who works from home. PRN support from children.   Team Discussion: Perked up and eating more. May pull NG tube out. Tone and pain to the left side. Patient is incontinent of B/B, MASD to buttocks covered with barrier cream and foam. Patient on target to meet rehab goals: Left side inattention, needs things on the right. Will assist, still max assist. Has shoulder pain. Max assist bed mobility, stand pivot transfers. Pain with mobility. SLP reports cognitive deficits, improved arousal, dys 1 diet, thin liquids. Patient is unsure what he is eating so he doesn't want to swallow. Had conversation with wife about how to puree what he likes and bring it in so that he would eat better.   *See Care Plan and progress notes for long and short-term goals.   Revisions to Treatment Plan:  Possible transition to PO diet only.  Teaching Needs: Family education, medication management, pain management, bowel/bladder management, skin/wound care, transfer training, gait training, balance training, endurance training.  Current Barriers to Discharge: Decreased caregiver support, Medical stability, Home enviroment access/layout, Incontinence, Wound care, Lack of/limited family support, Medication compliance, Behavior, and Nutritional means  Possible Resolutions to Barriers: Continue current medications, provide emotional support.     Medical Summary Current Status: more alert, eating a bit more. memory deficits, early tone left side. left inattention.  Barriers to Discharge:  Medical stability;Nutrition means   Possible Resolutions to [employer] Focus: appetite stimulant, transition to po diet. stimulants for arousal/attention. maximize sleep   Continued Need for Acute Rehabilitation Level of Care: The patient requires daily medical management by a physician with specialized training in physical medicine and rehabilitation for the  following reasons: Direction of a multidisciplinary physical rehabilitation program to maximize functional independence : Yes Medical management of patient stability for increased activity during participation in an intensive rehabilitation regime.: Yes Analysis of laboratory values and/or radiology reports with any subsequent need for medication adjustment and/or medical intervention. : Yes   I attest that I was present, lead the team conference, and concur with the assessment and plan of the team.   Tennis Must 06/15/2021, 3:40 PM

## 2021-06-16 LAB — GLUCOSE, CAPILLARY
Glucose-Capillary: 90 mg/dL (ref 70–99)
Glucose-Capillary: 123 mg/dL — ABNORMAL HIGH (ref 70–99)
Glucose-Capillary: 102 mg/dL — ABNORMAL HIGH (ref 70–99)
Glucose-Capillary: 95 mg/dL (ref 70–99)

## 2021-06-16 NOTE — Progress Notes (Signed)
Occupational Therapy Session Note  Patient Details  Name: Donald [name] MRN: [id number] Date of Birth: [birth year]  Today's Date: 06/16/2021 OT Individual Time: 1412-1500 OT Individual Time Calculation (min): 48 min    Short Term Goals: Week 2:  OT Short Term Goal 1 (Week 2): Pt will don shirt with max A in supported sitting. OT Short Term Goal 2 (Week 2): Pt will maintain midline orientation in supported sitting with overall max A. OT Short Term Goal 3 (Week 2): Pt will completed grooming task with mod A in supported sitting.  Skilled Therapeutic Interventions/Progress Updates:    Pt received in room in bed and consented to OT tx. Pt instructed in bed mobility to sit EOB with max A. Pt then instructed in functional transfers from EOB to TIS w/c with max A for SPT with 2 helpers to ensure safety. Pt brought down to therapy gym and transferred to mat with two helpers for SPT. Pt note to have posterior pelvic tilt, added wedge cushion underneath pt to facilitate neutral pelvis for improved postural control and maintaining midline. Second helper positioned behind pt for support when pt was instructed to reach outside of BOS, causing him to lose balance and requiring assist to return to midline. Pt instructed in L attention tasks, required max multimodal cuing to pick up pegs out of therapist's hand on pt's left side of body. Pt then instructed to cross midline to place in pegboard with multimodal cuing for correct placement. Pt continues to demo L neglect and L inattention during tasks req cuing for visual scanning and to attend to L side to complete activities. After tx, pt completed SPT from EOM to TIS w/c with 2 helpers, left in room up in [name] w/c with seatbelt alarm on and all needs met.   Therapy Documentation Precautions:  Precautions Precautions: Fall Precaution Comments: L hemiparesis, L inattention, pusher to the L, cognitive deficits, cortrak Restrictions Weight Bearing Restrictions:  No  Vital Signs: Therapy Vitals Temp: 99 F (37.2 C) Pulse Rate: (!) 104 Resp: 20 BP: 114/66 Patient Position (if appropriate): Lying Oxygen Therapy SpO2: 100 % O2 Device: Room Air Pain: 5/10 L shoulder    Therapy/Group: Individual Therapy  Laurian Edrington 06/16/2021, 3:37 PM

## 2021-06-16 NOTE — Progress Notes (Signed)
Occupational Therapy Session Note  Patient Details  Name: Donald [name] MRN: [id number] Date of Birth: Jun 15, 1956  Today's Date: 06/16/2021 OT Individual Time: 0160-1093 OT Individual Time Calculation (min): 60 min    Short Term Goals:  Week 2:  OT Short Term Goal 1 (Week 2): Pt will don shirt with max A in supported sitting. OT Short Term Goal 2 (Week 2): Pt will maintain midline orientation in supported sitting with overall max A. OT Short Term Goal 3 (Week 2): Pt will completed grooming task with mod A in supported sitting.  Skilled Therapeutic Interventions/Progress Updates:    Pt received supine with RN present. Pt was agreeable to OT session despite fatigue. Once movement began, pt immediately began c/o very limiting pain in his L thigh , described as muscle spasms, that increased during activity but subsided with rest breaks and sitting EOB. He required max A to transfer to sitting EOB. Once EOB he required intermittent mod A and CGA overall for sitting balance with forward flexed posture and L lean. Remainder of session focused on EOB ADLs- pt required multimodal cueing for initiation, L attention and sequencing ADLs. He demonstrated unawareness and actual denial of existence of his LUE at times. Max A overall for ADLs. Max A to stand from EOB with good initiation and manual facilitation required to bring his hips into more of an anterior weight shift and to facilitate upright posture. Pt unable to bring head out of forward flexion despite manual facilitation. He completed a squat pivot transfer to the TIS w/c with max A. He required min A for oral care while seated. Pt was left sitting up in the TIS w/c, with a L arm trough now in place. Chair alarm belt fastened.   Therapy Documentation Precautions:  Precautions Precautions: Fall Precaution Comments: L hemiparesis, L inattention, pusher to the L, cognitive deficits, cortrak Restrictions Weight Bearing Restrictions:  No   Therapy/Group: Individual Therapy  Crissie Reese 06/16/2021, 7:27 AM

## 2021-06-16 NOTE — Progress Notes (Signed)
Speech Language Pathology Daily Session Note  Patient Details  Name: Donald [name] MRN: [id number] Date of Birth: 1956-10-30  Today's Date: 06/16/2021 SLP Individual Time: 8756-4332 SLP Individual Time Calculation (min): 40 min  Short Term Goals: Week 2: SLP Short Term Goal 1 (Week 2): Patient will consume current diet with minimal overt s/s of aspiration and Mod verbal cues for use of swallowing compensatory strategies. SLP Short Term Goal 2 (Week 2): Patient will locate/attend to left field of enviornment in 50% of opportunities with Max A verbal cues. SLP Short Term Goal 3 (Week 2): Patient will orient to place, time and situation with Max visual aids. SLP Short Term Goal 4 (Week 2): Patient will demonstrate sustained attention to functional tasks for 5 minutes with Mod verbal cues for recdirection. SLP Short Term Goal 5 (Week 2): Patient will utilize speech intelligibility strategies to achieve ~75% intelligibility at the phrase level with Min verbal cues.  Skilled Therapeutic Interventions: Skilled treatment session focused on dysphagia and cognitive goals. SLP facilitated session by providing overall Mod A verbal and tactile cues for sustained attention and initiation of self-feeding with snack of Dys. 1 textures with thin liquids. Patient consumed meal without overt s/s of aspiration with mildly prolonged AP transit noted. Recommend patient continue current diet. SLP also facilitated session by providing Max A verbal and tactile cues for left visual scanning and overall attention for utilization of a calendar to the date, however, patient was independently oriented to the place. Patient left upright in bed with alarm on and all needs within reach. Continue with current plan of care.      Pain No/Denies Pain   Therapy/Group: Individual Therapy  Yarixa Lightcap 06/16/2021, 3:15 PM

## 2021-06-16 NOTE — Progress Notes (Signed)
Physical Therapy Session Note  Patient Details  Name: Donald [name] MRN: [id number] Date of Birth: Sep 10, 1956  Today's Date: 06/16/2021 PT Individual Time: 0900-0956 PT Individual Time Calculation (min): 56 min   Short Term Goals: Week 2:  PT Short Term Goal 1 (Week 2): Pt will perform bed<>WC transfer with max assist of 1 PT Short Term Goal 2 (Week 2): Pt will perform gait training at rail in hall x 40ft with max A +2 PT Short Term Goal 3 (Week 2): Pt will perform WC propulsion x36ft and mod assist using hemi technique PT Short Term Goal 4 (Week 2): Pt will perform sitting balance EOB with mod assist up to 5 minutes  Skilled Therapeutic Interventions/Progress Updates:     Patient in TIS w/c asleep upon PT arrival. Patient aroused to verbal stimuli and agreeable to PT session. Patient reported un-rated L arm and leg pain during session, RN made aware. PT provided repositioning, rest breaks, and distraction as pain interventions throughout session. Patient initially declined therapy stating he needed to sleep, but was agreeable to addressing L shoulder pain and subluxation with kinesiotape.  Applied Kinesiotape over anterior, lateral, and posterior deltoid using structural support technique with the aim of reducing inferior subluxation of the R humeral head and reducing pain. Prior to application, patient denied any history of skin irritation or allergy to adhesive. Educated patient on purpose of kinesiotape placement and signs symptoms of allergic reaction or irritation. Cleaned patient's skin and applied test strip at beginning of session and removed at end of session without sings of skin irritation. Instructed patient that the tape can be left on up to 3 days and can be worn in the shower. Instructed to removed the tape if peeling off, signs of skin irritation arise, or it has been on for >3 days. Informed patient that the tape is best removed in the shower or with a wet wash cloth. Patient  stated understanding. Rehab team informed about tape placement to assist with monitoring patient's response.  Performed L wrist, elbow, shoulder to 90 deg flexion/extension, shoulder abduction to 90 deg and IR/ER with graded reciprocal movements to improved ROM due to spasticity and joint tightness. Performed grade 3/4 mobilizations to L wrist to promote wrist extension and grade 1/2 mobilizations for L shoulder distraction for pain reduction. Performed L scapular mobilizations in all planes with limited mobility.   Performed stand/squat pivot TIS w/c>bed with mod-max A +2, limited hip/knee/trunk extension in standing. Patient performed sit to supine with max A with cues to bring his knees to his chest to lift his legs onto the bed. Provided patient with manual stretch to extend L hip/knee in supine x2 min. Patient performed rolling from supine to prone with max A +2 and increased time due to pain. Laid in prone with 2 pillows under hips due to reduced hip extension tolerance x2 min, 1 pillow x1 min and no pillows x1 min to patient tolerance. Returned to supine with max A +2 with patient able to fully extend his L knee and hip in supine >1 min.   Stretching/ROM and joint mobilizations performed for management of hypertonicity of L upper and lower extremity during session.   Patient verbally perseverative and speaking to himself throughout session.   Patient in bed with HOB elevated at end of session with breaks locked, bed alarm set, and all needs within reach.   Therapy Documentation Precautions:  Precautions Precautions: Fall Precaution Comments: L hemiparesis, L inattention, pusher to the L, cognitive deficits,  cortrak Restrictions Weight Bearing Restrictions: No    Therapy/Group: Individual Therapy  Sarai January L Charnette Younkin PT, DPT  06/16/2021, 6:09 PM

## 2021-06-17 LAB — GLUCOSE, CAPILLARY
Glucose-Capillary: 99 mg/dL (ref 70–99)
Glucose-Capillary: 121 mg/dL — ABNORMAL HIGH (ref 70–99)
Glucose-Capillary: 98 mg/dL (ref 70–99)
Glucose-Capillary: 114 mg/dL — ABNORMAL HIGH (ref 70–99)

## 2021-06-17 MED ORDER — AMANTADINE HCL 50 MG/5ML PO SOLN
100.0000 mg | Freq: Every day | ORAL | Status: DC
  Administered 2021-06-18: 100 mg
  Filled 2021-06-17: qty 10

## 2021-06-17 MED ORDER — ACETAMINOPHEN 325 MG PO TABS
650.0000 mg | ORAL_TABLET | Freq: Two times a day (BID) | ORAL | Status: DC
  Administered 2021-06-17 – 2021-06-19 (×4): 650 mg via ORAL
  Filled 2021-06-17 (×4): qty 2

## 2021-06-17 NOTE — Progress Notes (Addendum)
Occupational Therapy Session Note  Patient Details  Name: Donald [name] MRN: [id number] Date of Birth: 10/17/1956  Today's Date: 06/17/2021 OT Individual Time: 1430-1500 OT Individual Time Calculation (min): 30 min    Short Term Goals: Week 1:  OT Short Term Goal 1 (Week 1): Pt will maintain static sitting balance with max A of 1 in prep for seated ADL. OT Short Term Goal 1 - Progress (Week 1): Met OT Short Term Goal 2 (Week 1): Pt will completed grooming task with mod A in supported sitting. OT Short Term Goal 2 - Progress (Week 1): Progressing toward goal OT Short Term Goal 3 (Week 1): Pt will don shirt with max A in supported sitting. OT Short Term Goal 3 - Progress (Week 1): Progressing toward goal OT Short Term Goal 4 (Week 1): Pt will maintain midline orientation in supported sitting with overall mod A. OT Short Term Goal 4 - Progress (Week 1): Progressing toward goal Week 2:  OT Short Term Goal 1 (Week 2): Pt will don shirt with max A in supported sitting. OT Short Term Goal 2 (Week 2): Pt will maintain midline orientation in supported sitting with overall max A. OT Short Term Goal 3 (Week 2): Pt will completed grooming task with mod A in supported sitting.  Skilled Therapeutic Interventions/Progress Updates:    Pt sitting up in w/c, no c/o pain and agreeable to OT session.  Pt participated in left visual scanning and attention activites while transported to ortho gym to count number of doors on left side then to complete BITs activities. Pt required max Vcs throughout and intermittent Tcs and visual cues to successfully atttend and scan into left visual field.  Pt returned to room, call bell in reach, seat alarm on at end of session.   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

## 2021-06-17 NOTE — Progress Notes (Signed)
Speech Language Pathology Daily Session Note  Patient Details  Name: Donald [name] MRN: [id number] Date of Birth: July 21, 1956  Today's Date: 06/17/2021 SLP Individual Time: 0725-0810 SLP Individual Time Calculation (min): 45 min  Short Term Goals: Week 2: SLP Short Term Goal 1 (Week 2): Patient will consume current diet with minimal overt s/s of aspiration and Mod verbal cues for use of swallowing compensatory strategies. SLP Short Term Goal 2 (Week 2): Patient will locate/attend to left field of enviornment in 50% of opportunities with Max A verbal cues. SLP Short Term Goal 3 (Week 2): Patient will orient to place, time and situation with Max visual aids. SLP Short Term Goal 4 (Week 2): Patient will demonstrate sustained attention to functional tasks for 5 minutes with Mod verbal cues for recdirection. SLP Short Term Goal 5 (Week 2): Patient will utilize speech intelligibility strategies to achieve ~75% intelligibility at the phrase level with Min verbal cues.  Skilled Therapeutic Interventions: Skilled treatment session focused on dysphagia and cognitive goals. Upon arrival, patient was awake in bed and reported he was hungry. SLP facilitated session by providing set-up assist and initial verbal cues with extra time for initiation of self-feeding. Patient consumed 100% of meal with overt cough X 1, suspect due to mixed consistencies. Recommend patient continue current diet. Overall, patient demonstrated increased sustained attention and initiation with self-feeding resulting in improved PO intake. SLP also facilitated session by providing Mod-Max A verbal and visual cues for use of external aids for orientation to time and to locate items within his left visual field. Patient left upright in bed with alarm on and all needs within reach. Continue with current plan of care.      Pain Pain Assessment Pain Scale: 0-10 Pain Score: 0-No pain  Therapy/Group: Individual Therapy  Julianah Marciel,  Amil Bouwman 06/17/2021, 11:51 AM

## 2021-06-17 NOTE — Progress Notes (Signed)
Occupational Therapy Session Note  Patient Details  Name: Donald [name] MRN: [id number] Date of Birth: [birth year]  Today's Date: 06/17/2021 OT Individual Time: 5056-9794 OT Individual Time Calculation (min): 59 min    Short Term Goals: Week 1:  OT Short Term Goal 1 (Week 1): Pt will maintain static sitting balance with max A of 1 in prep for seated ADL. OT Short Term Goal 1 - Progress (Week 1): Met OT Short Term Goal 2 (Week 1): Pt will completed grooming task with mod A in supported sitting. OT Short Term Goal 2 - Progress (Week 1): Progressing toward goal OT Short Term Goal 3 (Week 1): Pt will don shirt with max A in supported sitting. OT Short Term Goal 3 - Progress (Week 1): Progressing toward goal OT Short Term Goal 4 (Week 1): Pt will maintain midline orientation in supported sitting with overall mod A. OT Short Term Goal 4 - Progress (Week 1): Progressing toward goal Week 2:  OT Short Term Goal 1 (Week 2): Pt will don shirt with max A in supported sitting. OT Short Term Goal 2 (Week 2): Pt will maintain midline orientation in supported sitting with overall max A. OT Short Term Goal 3 (Week 2): Pt will completed grooming task with mod A in supported sitting.  Skilled Therapeutic Interventions/Progress Updates:    Pt received in room in bed and consented to OT tx. Pt seen for washing up and dressing at bed level this day. Completed LB bathing and dressing while supine in bed for increased safety and independence, UB bathing and dressing sitting EOB. Pt handed wash cloth and req max verbal cuing for washing up thoroughness, therapist mod A for proficiency. Pt required cuing to attend to L side of body during all bathing and dressing tasks. Pt req total A for brief change and to thread feet through pants while supine, max A to roll to R side and mod A to roll to L side to hike pants. Pt req max A to sit EOB and 2nd helper standing by for sitting balance during UB dressing hemi technique  instruction. Pt demo's poor carryover of dressing techniques and does no follow simple commands/instruction and does not attend to L arm during dressing. Pt req max A to doff and don shirt. Max A for SPT to w/c with pt given max cues to take steps/pivot with poor carryover. Pt then instructed in oral hygiene seated sink side with increased time and SUP. Pt requires cuing for initiation and termination of oral care task. After ADL, pt taken down to gym, therapist completed PROM on L arm, however pt resistant to shoulder ROM 2/2 pain. Performed elbow and wrist PROM for 2x10 flexion and extension, only able to perform shoulder flexion and abduction 1x5 each way to nearly 90* 2/2 pt's c/o pain. After tx, pt left up in TIS w/c in room with wife present, seatbelt alarm on and all needs met.   Therapy Documentation Precautions:  Precautions Precautions: Fall Precaution Comments: L hemiparesis, L inattention, pusher to the L, cognitive deficits, cortrak Restrictions Weight Bearing Restrictions: No Vital Signs: Therapy Vitals Temp: 98.4 F (36.9 C) Temp Source: Oral Pulse Rate: 94 BP: 131/86 Oxygen Therapy SpO2: 100 % Pain: Pain Assessment Pain Scale: 0-10 Pain Score: 0-No pain    Therapy/Group: Individual Therapy  Donald [name] 06/17/2021, 7:56 AM

## 2021-06-17 NOTE — Progress Notes (Signed)
Physical Therapy Session Note  Patient Details  Name: Donald [name] MRN: [id number] Date of Birth: 1956/09/16  Today's Date: 06/17/2021 PT Individual Time: 1304-1400 PT Individual Time Calculation (min): 56 min   Short Term Goals: Week 2:  PT Short Term Goal 1 (Week 2): Pt will perform bed<>WC transfer with max assist of 1 PT Short Term Goal 2 (Week 2): Pt will perform gait training at rail in hall x 26ft with max A +2 PT Short Term Goal 3 (Week 2): Pt will perform WC propulsion x70ft and mod assist using hemi technique PT Short Term Goal 4 (Week 2): Pt will perform sitting balance EOB with mod assist up to 5 minutes  Skilled Therapeutic Interventions/Progress Updates:     Pt received seated in [facility] and agrees to therapy. WC transport to gym for time management. Pt performs stand pivot transfer to mat table with modA, achieving nearly full upright stand, with PT blocking L knee. Following extended seated rest break, pt ambulates x25' with 3 musketeers technique and PT providing totalA to manage L leg. Pt begins to complain of pain in L hip during ambulation. Pt seated at edge of mat with blue platform between legs. Pt performs anterior scoot onto platform and attempts to reach for objects on floor and to the R to encourage neutral posture. Pt continues to complain of L hip pain. Scoot back onto mat with maxA +2. Pt assisted into prone with totalA +2 to provide manual stretch of hip flexors. PT provides manual overpressure on L PSIS and also gradually flexing L knee for quad stretch. Pt eventually reports decrease in pain but is generally complaining of discomfort throughout session. Pt assisted into tall kneeling with platform for upper extremity support. Pt requires maxA to maintain posture and balance in position, with emphasis on hip extension. Pt then assisted in quadruped positioning but cannot tolerate for more than a few seconds due to hip pain. Pt rests in prone prior to being assisted to  sitting position with totalA +2. Squat pivot back to chair with maxA. Left seated with alarm intact and all needs within reach.  Therapy Documentation Precautions:  Precautions Precautions: Fall Precaution Comments: L hemiparesis, L inattention, pusher to the L, cognitive deficits, cortrak Restrictions Weight Bearing Restrictions: No   Therapy/Group: Individual Therapy  Beau Fanny, PT, DPT 06/17/2021, 4:04 PM

## 2021-06-17 NOTE — Progress Notes (Signed)
PROGRESS NOTE   Subjective/Complaints: No new complaints. Ate almost 100% breakfast today! Denies pain.   ROS: Patient denies fever, rash, sore throat, blurred vision, nausea, vomiting, diarrhea, cough, shortness of breath or chest pain, joint or back pain, headache, or mood change.   Objective:   No results found. Recent Labs    06/15/21 0539  WBC 5.6  HGB 12.9*  HCT 40.2  PLT 396   Recent Labs    06/15/21 0539  NA 136  K 3.5  CL 105  CO2 26  GLUCOSE 122*  BUN 14  CREATININE 0.86  CALCIUM 9.1     Intake/Output Summary (Last 24 hours) at 06/17/2021 1051 Last data filed at 06/16/2021 1751 Gross per 24 hour  Intake 180 ml  Output --  Net 180 ml         Physical Exam: Vital Signs Blood pressure 131/86, pulse 94, temperature 98.4 F (36.9 C), temperature source Oral, resp. rate 17, height 5\' 5"  (1.651 m), weight 61.3 kg, SpO2 100 %.  Constitutional: No distress . Vital signs reviewed. HEENT: EOMI, oral membranes moist, NGT Neck: supple Cardiovascular: RRR without murmur. No JVD    Respiratory/Chest: CTA Bilaterally without wheezes or rales. Normal effort    GI/Abdomen: BS +, non-tender, non-distended Ext: no clubbing, cyanosis, or edema Psych: pleasant and cooperative  Neurological:    Comments: left C7.  More alert.  follows basic commands. Ongoing STM deficits. Left inattention. Dense left hemiparesis without change. RUE and RLE 4/5. Decreased LT Left side  Musc: no pain with ROM  Assessment/Plan: 1. Functional deficits which require 3+ hours per day of interdisciplinary therapy in a comprehensive inpatient rehab setting. Physiatrist is providing close team supervision and 24 hour management of active medical problems listed below. Physiatrist and rehab team continue to assess barriers to discharge/monitor patient progress toward functional and medical goals  Care Tool:  Bathing    Body parts  bathed by patient: Face   Body parts bathed by helper: Right arm, Left arm, Chest, Abdomen, Front perineal area, Buttocks, Right upper leg, Left upper leg, Right lower leg, Left lower leg     Bathing assist Assist Level: 2 Helpers     Upper Body Dressing/Undressing Upper body dressing   What is the patient wearing?: Pull over shirt    Upper body assist Assist Level: Total Assistance - Patient < 25%    Lower Body Dressing/Undressing Lower body dressing      What is the patient wearing?: Pants     Lower body assist Assist for lower body dressing: Maximal Assistance - Patient 25 - 49%     Toileting Toileting    Toileting assist Assist for toileting: 2 Helpers     Transfers Chair/bed transfer  Transfers assist     Chair/bed transfer assist level: 2 Helpers     Locomotion Ambulation   Ambulation assist   Ambulation activity did not occur: Safety/medical concerns          Walk 10 feet activity   Assist  Walk 10 feet activity did not occur: Safety/medical concerns        Walk 50 feet activity   Assist Walk 50 feet  with 2 turns activity did not occur: Safety/medical concerns         Walk 150 feet activity   Assist Walk 150 feet activity did not occur: Safety/medical concerns         Walk 10 feet on uneven surface  activity   Assist Walk 10 feet on uneven surfaces activity did not occur: Safety/medical concerns         Wheelchair     Assist Will patient use wheelchair at discharge?: Yes Type of Wheelchair: Manual    Wheelchair assist level: Dependent - Patient 0% Max wheelchair distance: 150    Wheelchair 50 feet with 2 turns activity    Assist        Assist Level: Dependent - Patient 0%   Wheelchair 150 feet activity     Assist      Assist Level: Dependent - Patient 0%   Blood pressure 131/86, pulse 94, temperature 98.4 F (36.9 C), temperature source Oral, resp. rate 17, height 5\' 5"  (1.651 m), weight  61.3 kg, SpO2 100 %.    Medical Problem List and Plan: 1.  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              -patient may shower             -ELOS/Goals: MinA 3-4weeks  -Continue CIR therapies including PT, OT, and SLP        2.  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 Wife prefers no oral anticoagulation given hematoma.  -DVT/anticoagulation:  Mechanical:  Antiembolism stockings, knee (TED hose) Bilateral lower extremities             -antiplatelet therapy: N/A due to concerns of CAA 3. Pain Management: N/A.  4. Mood/arousal:    -reduce amantadine to 100mg  qd. continue ritalin 5mg  bid for arousal/initiaton             -antipsychotic agents: N/A 5. Neuropsych: This patient is not fully capable of making decisions on his own behalf. 6. Skin/Wound Care: Routine pressure relief measures. 7. Fluids/Electrolytes/Nutrition: Monitor I/O . 8. Recurrent aspiration event:    -unasyn started 6/20---stopped 6/30             --  Wbc's 8k             --CXR recently stable.   -sounding much better! 9. Leucocytosis: resolved 10. Blood pressure: Monitor BID--controlled without meds at this time. Vitals:   06/16/21 2013 06/17/21 0433  BP: 122/85 131/86  Pulse: (!) 103 94  Resp: 17   Temp: 98.6 F (37 C) 98.4 F (36.9 C)  SpO2: 100% 100%   Controlled 7/5 11. Progressive cognitive decline: To reschedule dementia work up at [facility] after d/c. 12. Hyperglycemia: Hgb A1C- 5.8 and BS mildly elevated due to tube feeds. Continue lantus 5 units daily for now.             --Monitor BS every  4 hours and use SSI for elevated  BS  -  lantus 8u daily  CBG (last 3)  Recent Labs    06/16/21 1638 06/16/21 2114 06/17/21 0559  GLUCAP 102* 95 99   Controlled 7/7---now that off TF will hold lantus 13. Dysphagia: MBS this morning. D1/thins diet initiated!08/17/21             --oral care every 4 hours while awake.  -have changed TF's  to night to help stimulate PO intake  7/2 -megace  trial started  7/7 eating well enough. Dc NGT 14. Tachycardia: 90's - monitor HR TID 15. Elevated liver transaminases: Liver u/s with one small cystic structure.   -all LFT's nearly back wnl  16. Mild anemia-Ferritin elevated but Hgb, Fe++ and TIBC all low which point towards anemia of chronic disease.      LOS: 13 days A FACE TO FACE EVALUATION WAS PERFORMED  Ranelle Oyster 06/17/2021, 10:51 AM

## 2021-06-18 LAB — GLUCOSE, CAPILLARY
Glucose-Capillary: 97 mg/dL (ref 70–99)
Glucose-Capillary: 140 mg/dL — ABNORMAL HIGH (ref 70–99)
Glucose-Capillary: 91 mg/dL (ref 70–99)
Glucose-Capillary: 109 mg/dL — ABNORMAL HIGH (ref 70–99)

## 2021-06-18 MED ORDER — INSULIN ASPART 100 UNIT/ML IJ SOLN
0.0000 [IU] | Freq: Three times a day (TID) | INTRAMUSCULAR | Status: DC
Start: 2021-06-18 — End: 2021-06-19
  Administered 2021-06-18: 1 [IU] via SUBCUTANEOUS

## 2021-06-18 MED ORDER — METHYLPHENIDATE HCL 5 MG PO TABS
5.0000 mg | ORAL_TABLET | Freq: Two times a day (BID) | ORAL | Status: DC
  Administered 2021-06-19 – 2021-06-25 (×13): 5 mg via ORAL
  Filled 2021-06-18 (×13): qty 1

## 2021-06-18 MED ORDER — INSULIN ASPART 100 UNIT/ML IJ SOLN: 0.0000 [IU] | Freq: Three times a day (TID) | INTRAMUSCULAR | Status: DC

## 2021-06-18 MED ORDER — AMANTADINE HCL 100 MG PO CAPS
100.0000 mg | ORAL_CAPSULE | Freq: Two times a day (BID) | ORAL | Status: DC
  Administered 2021-06-18 – 2021-06-21 (×5): 100 mg via ORAL
  Filled 2021-06-18 (×7): qty 1

## 2021-06-18 NOTE — Progress Notes (Signed)
Physical Therapy Weekly Progress Note  Patient Details  Name: Donald [name] MRN: [id number] Date of Birth: 12-Dec-1956  Beginning of progress report period: June 11, 2021 End of progress report period: June 18, 2021  Today's Date: 06/18/2021 PT Individual Time: 2130-8657 PT Individual Time Calculation (min): 55 min   Patient has met 1 of 4 short term goals.  Pt is progressing slowly toward PT goals, improving independence with transfers and with gradually improving sitting balance, but still requiring maxA +1 to maxA+2 for majority of mobility tasks. Pt progress has been impaired by ongoing issues with pain and fear of pain provocation, as well as deficits associated with flexor tone and motor planning.   Patient continues to demonstrate the following deficits muscle weakness, decreased cardiorespiratoy endurance, abnormal tone, unbalanced muscle activation, ataxia, decreased coordination, and decreased motor planning, decreased midline orientation and decreased attention to left, and decreased sitting balance, decreased standing balance, decreased postural control, hemiplegia, and decreased balance strategies and therefore will continue to benefit from skilled PT intervention to increase functional independence with mobility.  Patient progressing toward long term goals..  Continue plan of care.  PT Short Term Goals Week 2:  PT Short Term Goal 1 (Week 2): Pt will perform bed<>WC transfer with max assist of 1 PT Short Term Goal 1 - Progress (Week 2): Met PT Short Term Goal 2 (Week 2): Pt will perform gait training at rail in hall x 63ft with max A +2 PT Short Term Goal 2 - Progress (Week 2): Progressing toward goal PT Short Term Goal 3 (Week 2): Pt will perform WC propulsion x59ft and mod assist using hemi technique PT Short Term Goal 3 - Progress (Week 2): Progressing toward goal PT Short Term Goal 4 (Week 2): Pt will perform sitting balance EOB with mod assist up to 5 minutes PT Short Term  Goal 4 - Progress (Week 2): Progressing toward goal Week 3:  PT Short Term Goal 1 (Week 3): Pt will perform bed mobility with modA +1. PT Short Term Goal 2 (Week 3): Pt will perform sitting balance EOB with modA >5 minutes. PT Short Term Goal 3 (Week 3): Pt will perform gait training  x 29ft with max A +2 PT Short Term Goal 4 (Week 3): Pt will perform WC propulsion x25ft and mod assist using hemi technique  Skilled Therapeutic Interventions/Progress Updates:  Ambulation/gait training;Balance/vestibular training;Cognitive remediation/compensation;DME/adaptive equipment instruction;Discharge planning;Community reintegration;Disease management/prevention;Functional electrical stimulation;Functional mobility training;Neuromuscular re-education;Pain management;Patient/family education;Psychosocial support;Skin care/wound management;Splinting/orthotics;Stair training;Therapeutic Activities;Therapeutic Exercise;Wheelchair propulsion/positioning;UE/LE Coordination activities;UE/LE Strength taining/ROM;Visual/perceptual remediation/compensation   Pt received supine in bed and agrees to therapy. Pt complains of pain in L hip, no number provided. PT provides rest breaks and repositioning to manage pain. Pt cued to perform supine to sit and is able to perform roll to the L with minA, but has difficulty sequencing remainder of transitional movement. PT provides maxA to complete. Upon sitting EOB, PT notes that pt's brief is saturated with urine. Pt returns to supine with maxA and performs bilateral rolling with modA to doff brief and pants for hygiene. Pt performs supine to sit with maxA. Squat pivot to WC with maxA to the R. WC transport to gym for time management. Remainder of session with emphasis on achieving more neutral posture of cervical spin, as pt presents with forward flexed and R rotation of neck. PT placed pt's tilt in space WC in full reclined position, and utilizes pillow case over pt's forehead to provide  consistent and light extension force to  stretch neck flexors. PT gradually incorporates L cervical rotation and utilizes manual pressure over SCM for comfort and increased muscle release. Following soft tissue lengthening, pt presents with close to neutral cervical posture and is able to hold head against headrest with verbal cues. Pt left seated in WC with alarm intact and all needs within reach.  Therapy Documentation Precautions:  Precautions Precautions: Fall Precaution Comments: L hemiparesis, L inattention, pusher to the L, cognitive deficits, cortrak Restrictions Weight Bearing Restrictions: No   Therapy/Group: Individual Therapy  [name], PT, DPT 06/18/2021, 5:24 PM

## 2021-06-18 NOTE — Progress Notes (Signed)
Educated pt on stroke signs and symptoms, diet, exercise. Pt needs reinforcement. Mylo Red, LPN

## 2021-06-18 NOTE — Progress Notes (Signed)
PROGRESS NOTE   Subjective/Complaints: Up alert in bed. Trying to open a greeting card. Eating well now!  ROS: limited d/t memory, cognition   Objective:   No results found. No results for input(s): WBC, HGB, HCT, PLT in the last 72 hours.  No results for input(s): NA, K, CL, CO2, GLUCOSE, BUN, CREATININE, CALCIUM in the last 72 hours.    Intake/Output Summary (Last 24 hours) at 06/18/2021 1040 Last data filed at 06/18/2021 0700 Gross per 24 hour  Intake 417 ml  Output --  Net 417 ml         Physical Exam: Vital Signs Blood pressure (!) 147/88, pulse 97, temperature 98 F (36.7 C), resp. rate 17, height 5\' 5"  (1.651 m), weight 62.2 kg, SpO2 100 %.  Constitutional: No distress . Vital signs reviewed. HEENT: EOMI, oral membranes moist Neck: supple Cardiovascular: RRR without murmur. No JVD    Respiratory/Chest: CTA Bilaterally without wheezes or rales. Normal effort    GI/Abdomen: BS +, non-tender, non-distended Ext: no clubbing, cyanosis, or edema Psych: pleasant and cooperative, confused Neurological:    Comments: left C7.  More alert.  follows basic commands. persistent STM deficits. Left inattention. Dense left hemiparesis without change. RUE and RLE 4/5. Decreased LT Left side  ongoing. DTR's 3+ on left side Musc: no pain with ROM  Assessment/Plan: 1. Functional deficits which require 3+ hours per day of interdisciplinary therapy in a comprehensive inpatient rehab setting. Physiatrist is providing close team supervision and 24 hour management of active medical problems listed below. Physiatrist and rehab team continue to assess barriers to discharge/monitor patient progress toward functional and medical goals  Care Tool:  Bathing    Body parts bathed by patient: Face   Body parts bathed by helper: Right arm, Left arm, Chest, Abdomen, Front perineal area, Buttocks, Right upper leg, Left upper leg, Right  lower leg, Left lower leg     Bathing assist Assist Level: 2 Helpers     Upper Body Dressing/Undressing Upper body dressing   What is the patient wearing?: Pull over shirt    Upper body assist Assist Level: Total Assistance - Patient < 25%    Lower Body Dressing/Undressing Lower body dressing      What is the patient wearing?: Pants     Lower body assist Assist for lower body dressing: Maximal Assistance - Patient 25 - 49%     Toileting Toileting    Toileting assist Assist for toileting: 2 Helpers     Transfers Chair/bed transfer  Transfers assist     Chair/bed transfer assist level: Maximal Assistance - Patient 25 - 49%     Locomotion Ambulation   Ambulation assist   Ambulation activity did not occur: Safety/medical concerns  Assist level: 2 helpers Assistive device:  (3 musketeers) Max distance: 25'   Walk 10 feet activity   Assist  Walk 10 feet activity did not occur: Safety/medical concerns  Assist level: 2 helpers Assistive device:  (3 musketeers)   Walk 50 feet activity   Assist Walk 50 feet with 2 turns activity did not occur: Safety/medical concerns         Walk 150 feet activity  Assist Walk 150 feet activity did not occur: Safety/medical concerns         Walk 10 feet on uneven surface  activity   Assist Walk 10 feet on uneven surfaces activity did not occur: Safety/medical concerns         Wheelchair     Assist Will patient use wheelchair at discharge?: Yes Type of Wheelchair: Manual    Wheelchair assist level: Dependent - Patient 0% Max wheelchair distance: 150    Wheelchair 50 feet with 2 turns activity    Assist        Assist Level: Dependent - Patient 0%   Wheelchair 150 feet activity     Assist      Assist Level: Dependent - Patient 0%   Blood pressure (!) 147/88, pulse 97, temperature 98 F (36.7 C), resp. rate 17, height 5\' 5"  (1.651 m), weight 62.2 kg, SpO2 100 %.     Medical Problem List and Plan: 1.  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              -patient may shower             -ELOS/Goals: MinA 3-4weeks  -Continue CIR therapies including PT, OT, and SLP         2.  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 Wife prefers no oral anticoagulation given hematoma.  -DVT/anticoagulation:  Mechanical:  Antiembolism stockings, knee (TED hose) Bilateral lower extremities             -antiplatelet therapy: N/A due to concerns of CAA 3. Pain Management: N/A.  4. Mood/arousal:    -reduce amantadine to 100mg  qd. continue ritalin 5mg  bid for arousal/initiaton             -antipsychotic agents: N/A 5. Neuropsych: This patient is not fully capable of making decisions on his own behalf. 6. Skin/Wound Care: Routine pressure relief measures. 7. Fluids/Electrolytes/Nutrition: monitor fluid intake while on PO alone  -I's/O's sl + 7/7 -recheck bmet Monday  . 8. Recurrent aspiration event:    -unasyn started 6/20---stopped 6/30             -- Wbc's 8k             --CXR recently stable.   7/8 -lungs clear, much improved! 9. Leucocytosis: resolved 10. Blood pressure: Monitor BID--controlled without meds at this time. Vitals:   06/17/21 2006 06/18/21 0443  BP: 128/84 (!) 147/88  Pulse: (!) 108 97  Resp: 18 17  Temp: 98.3 F (36.8 C) 98 F (36.7 C)  SpO2: 100% 100%   Controlled 7/8 11. Progressive cognitive decline: To reschedule dementia work up at [facility] after d/c. 12. Hyperglycemia: Hgb A1C- 5.8 and BS mildly elevated due to tube feeds. Continue lantus 5 units daily for now.             --Monitor BS every  4 hours and use SSI for elevated  BS  -  lantus 8u daily  CBG (last 3)  Recent Labs    06/17/21 1621 06/17/21 2104 06/18/21 0619  GLUCAP 98 114* 97   7/8 holding lantus, cbg's controlled 13. Dysphagia: MBS this morning. D1/thins diet initiated!08/18/21             --oral care  every 4 hours while awake.  -have changed TF's to night to help stimulate PO intake  7/2 -megace trial started  7/8 tolerating diet   -eating most of  meals now! 14. Tachycardia: 90's - monitor HR TID 15. Elevated liver transaminases: Liver u/s with one small cystic structure.   -all LFT's nearly back wnl  16. Mild anemia-Ferritin elevated but Hgb, Fe++ and TIBC all low which point towards anemia of chronic disease.      LOS: 14 days A FACE TO FACE EVALUATION WAS PERFORMED  Ranelle Oyster 06/18/2021, 10:40 AM

## 2021-06-18 NOTE — Progress Notes (Signed)
Speech Language Pathology Weekly Progress and Session Note  Patient Details  Name: Donald [name] MRN: [id number] Date of Birth: 1956-08-05  Beginning of progress report period: June 11, 2021 End of progress report period: June 18, 2021  Today's Date: 06/18/2021 SLP Individual Time: 0700-0800 SLP Individual Time Calculation (min): 60 min  Short Term Goals: Week 2: SLP Short Term Goal 1 (Week 2): Patient will consume current diet with minimal overt s/s of aspiration and Mod verbal cues for use of swallowing compensatory strategies. SLP Short Term Goal 1 - Progress (Week 2): Met SLP Short Term Goal 2 (Week 2): Patient will locate/attend to left field of enviornment in 50% of opportunities with Max A verbal cues. SLP Short Term Goal 2 - Progress (Week 2): Met SLP Short Term Goal 3 (Week 2): Patient will orient to place, time and situation with Max visual aids. SLP Short Term Goal 3 - Progress (Week 2): Met SLP Short Term Goal 4 (Week 2): Patient will demonstrate sustained attention to functional tasks for 5 minutes with Mod verbal cues for recdirection. SLP Short Term Goal 4 - Progress (Week 2): Met SLP Short Term Goal 5 (Week 2): Patient will utilize speech intelligibility strategies to achieve ~75% intelligibility at the phrase level with Min verbal cues. SLP Short Term Goal 5 - Progress (Week 2): Met    New Short Term Goals: Week 3: SLP Short Term Goal 1 (Week 3): Patient will consume current diet with minimal overt s/s of aspiration and Min verbal cues for use of swallowing compensatory strategies. SLP Short Term Goal 2 (Week 3): Patient will locate/attend to left field of enviornment in 75% of opportunities with Mod A verbal cues. SLP Short Term Goal 3 (Week 3): Patient will orient to place, time and situation with Mod visual cues. SLP Short Term Goal 4 (Week 3): Patient will demonstrate sustained attention to functional tasks for 10 minutes with Mod verbal cues for recdirection. SLP  Short Term Goal 5 (Week 3): Patient will demonstrate efficient mastication with complete oral clearance without overt s/s of aspiraiton with trials of Dys. 2 textures over 2 sessions with overall Min A verbal cues prior to  upgade.  Weekly Progress Updates: Patient has made excellent gains and has met 5 of 5 STGs this reporting period. Currently, patient demonstrates improved overall PO intake and is consuming Dys. 1 textures with thin liquids with minimal overt s/s of aspiration with only Min-Mod A verbal cues needed for attention to bolus and intermittent oral holding. Recommend trials of upgraded textures this upcoming week. Due to improved management of secretions, patient's overall speech intelligibility has dramatically improved and patient is 100% intelligible at the phrase and sentence level. Patient continues to demonstrate moderate-severe cognitive impairments and requires overall Mod-Max A verbal cues to complete functional and familiar tasks safely in regards to sustained attention, visual scanning to the left field of environment, orientation and biographical information, however, suspect some of this is impacted by his baseline memory deficits. Patient and family education is ongoing. Patient would benefit from continued skilled SLP intervention to maximize his cognitive and swallowing function prior to discharge.      Intensity: Minumum of 1-2 x/day, 30 to 90 minutes Frequency: 3 to 5 out of 7 days Duration/Length of Stay: 07/02/21 Treatment/Interventions: Cognitive remediation/compensation;Dysphagia/aspiration precaution training;Internal/external aids;Speech/Language facilitation;Environmental controls;Cueing hierarchy;Functional tasks;Patient/family education;Therapeutic Activities   Daily Session  Skilled Therapeutic Interventions:  Skilled treatment session focused on dysphagia and cognitive goals. SLP facilitated session by providing set-up assist and  initial verbal cues with extra  time for initiation of self-feeding. Patient consumed 100% of meal without overt s/s of aspiration and overall supervision level verbal cues for use of swallowing compensatory strategies. Recommend patient continue current diet. Overall, patient continues to demonstrate increased sustained attention and initiation with self-feeding resulting in improved PO intake. SLP also facilitated session by providing extra time and Min verbal cues for visual scanning and overall attention while trying to write a message in a birthday card for his wife. Patient also demonstrated deficits in reading aloud but difficult to differentiate language vs cognition vs both. Patient left upright in bed with alarm on and all needs within reach. Continue with current plan of care.      Pain No/Denies Pain   Therapy/Group: Individual Therapy  Ruthia Person 06/18/2021, 6:28 AM

## 2021-06-19 LAB — GLUCOSE, CAPILLARY
Glucose-Capillary: 94 mg/dL (ref 70–99)
Glucose-Capillary: 109 mg/dL — ABNORMAL HIGH (ref 70–99)
Glucose-Capillary: 125 mg/dL — ABNORMAL HIGH (ref 70–99)
Glucose-Capillary: 112 mg/dL — ABNORMAL HIGH (ref 70–99)
Glucose-Capillary: 118 mg/dL — ABNORMAL HIGH (ref 70–99)

## 2021-06-19 MED ORDER — ACETAMINOPHEN 325 MG PO TABS
650.0000 mg | ORAL_TABLET | Freq: Two times a day (BID) | ORAL | Status: DC | PRN
  Administered 2021-06-20 – 2021-07-04 (×10): 650 mg via ORAL
  Filled 2021-06-19 (×10): qty 2

## 2021-06-19 MED ORDER — LIDOCAINE 5 % EX PTCH
1.0000 | MEDICATED_PATCH | CUTANEOUS | Status: DC
  Administered 2021-06-19 – 2021-07-11 (×16): 1 via TRANSDERMAL
  Filled 2021-06-19 (×16): qty 1

## 2021-06-19 NOTE — Progress Notes (Signed)
PROGRESS NOTE   Subjective/Complaints: Sitting up, smiling. Repeats from his wife "no pain, no gain." Pocketing the Ensure and spitting it out- wife mostly throwing these and planning to bring in smoothies from home, will d/c order  ROS: Limited d/t memory, cognition   Objective:   No results found. No results for input(s): WBC, HGB, HCT, PLT in the last 72 hours.  No results for input(s): NA, K, CL, CO2, GLUCOSE, BUN, CREATININE, CALCIUM in the last 72 hours.    Intake/Output Summary (Last 24 hours) at 06/19/2021 1739 Last data filed at 06/19/2021 1445 Gross per 24 hour  Intake 240 ml  Output --  Net 240 ml         Physical Exam: Vital Signs Blood pressure 106/63, pulse (!) 104, temperature (!) 97.5 F (36.4 C), temperature source Oral, resp. rate 18, height 5\' 5"  (1.651 m), weight 60.8 kg, SpO2 100 %. Gen: no distress, normal appearing HEENT: oral mucosa pink and moist, NCAT Cardio: tachcyardia Chest: normal effort, normal rate of breathing Abd: soft, non-distended Ext: no edema  Psych: pleasant and cooperative, confused Neurological:    Comments: left C7.  More alert.  follows basic commands. persistent STM deficits. Left inattention. Dense left hemiparesis without change. RUE and RLE 4/5. Decreased LT Left side  ongoing. DTR's 3+ on left side Musc: no pain with ROM  Assessment/Plan: 1. Functional deficits which require 3+ hours per day of interdisciplinary therapy in a comprehensive inpatient rehab setting. Physiatrist is providing close team supervision and 24 hour management of active medical problems listed below. Physiatrist and rehab team continue to assess barriers to discharge/monitor patient progress toward functional and medical goals  Care Tool:  Bathing    Body parts bathed by patient: Face   Body parts bathed by helper: Right arm, Left arm, Chest, Abdomen, Front perineal area, Buttocks, Right  upper leg, Left upper leg, Right lower leg, Left lower leg     Bathing assist Assist Level: 2 Helpers     Upper Body Dressing/Undressing Upper body dressing   What is the patient wearing?: Pull over shirt    Upper body assist Assist Level: Total Assistance - Patient < 25%    Lower Body Dressing/Undressing Lower body dressing      What is the patient wearing?: Pants     Lower body assist Assist for lower body dressing: Maximal Assistance - Patient 25 - 49%     Toileting Toileting    Toileting assist Assist for toileting: 2 Helpers     Transfers Chair/bed transfer  Transfers assist     Chair/bed transfer assist level: Maximal Assistance - Patient 25 - 49%     Locomotion Ambulation   Ambulation assist   Ambulation activity did not occur: Safety/medical concerns  Assist level: 2 helpers Assistive device:  (3 musketeers) Max distance: 25'   Walk 10 feet activity   Assist  Walk 10 feet activity did not occur: Safety/medical concerns  Assist level: 2 helpers Assistive device:  (3 musketeers)   Walk 50 feet activity   Assist Walk 50 feet with 2 turns activity did not occur: Safety/medical concerns         Walk  150 feet activity   Assist Walk 150 feet activity did not occur: Safety/medical concerns         Walk 10 feet on uneven surface  activity   Assist Walk 10 feet on uneven surfaces activity did not occur: Safety/medical concerns         Wheelchair     Assist Will patient use wheelchair at discharge?: Yes Type of Wheelchair: Manual    Wheelchair assist level: Dependent - Patient 0% Max wheelchair distance: 150    Wheelchair 50 feet with 2 turns activity    Assist        Assist Level: Dependent - Patient 0%   Wheelchair 150 feet activity     Assist      Assist Level: Dependent - Patient 0%   Blood pressure 106/63, pulse (!) 104, temperature (!) 97.5 F (36.4 C), temperature source Oral, resp. rate 18,  height 5\' 5"  (1.651 m), weight 60.8 kg, SpO2 100 %.    Medical Problem List and Plan: 1.  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              -patient may shower             -ELOS/Goals: MinA 3-4weeks  -Continue CIR therapies including PT, OT, and SLP         2.  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 Wife prefers no oral anticoagulation given hematoma.  -DVT/anticoagulation:  Mechanical:  Antiembolism stockings, knee (TED hose) Bilateral lower extremities             -antiplatelet therapy: N/A due to concerns of CAA 3. Left shoulder pain: lidocaine patch ordered.  4. Mood/arousal:    -reduce amantadine to 100mg  qd. continue ritalin 5mg  bid for arousal/initiaton             -antipsychotic agents: N/A 5. Neuropsych: This patient is not fully capable of making decisions on his own behalf. 6. Skin/Wound Care: Routine pressure relief measures. 7. Fluids/Electrolytes/Nutrition: monitor fluid intake while on PO alone  -I's/O's sl + 7/7 -recheck bmet Monday  . 8. Recurrent aspiration event:    -unasyn started 6/20---stopped 6/30             -- Wbc's 8k             --CXR recently stable.   7/8 -lungs clear, much improved! 9. Leucocytosis: resolved 10. Blood pressure: Monitor BID--controlled without meds at this time. Vitals:   06/19/21 0415 06/19/21 1437  BP: (!) 139/91 106/63  Pulse: 87 (!) 104  Resp: 17 18  Temp: 98.5 F (36.9 C) (!) 97.5 F (36.4 C)  SpO2: 100% 100%   Controlled 7/8 11. Progressive cognitive decline: To reschedule dementia work up at [facility] after d/c. 12. Hyperglycemia: Hgb A1C- 5.8 and BS mildly elevated due to tube feeds. Continue lantus 5 units daily for now.             --Monitor BS every  4 hours and use SSI for elevated  BS  -  lantus 8u daily  CBG (last 3)  Recent Labs    06/19/21 0602 06/19/21 1209 06/19/21 1706  GLUCAP 94 109* 125*  D/c ensure- wife to bring in  smoothies from home.  13. Dysphagia: MBS this morning. D1/thins diet initiated!08/20/21             --oral care every 4 hours while awake.  -have changed TF's to night to help  stimulate PO intake  7/2 -megace trial started  7/8 tolerating diet   -eating most of meals now! 14. Tachycardia: 90's - monitor HR TID 15. Elevated liver transaminases: Liver u/s with one small cystic structure.   -all LFT's nearly back wnl  -made Tylenol PRN as per wife's request 16. Mild anemia-Ferritin elevated but Hgb, Fe++ and TIBC all low which point towards anemia of chronic disease.  17. Lower back pain: kpad ordered.      LOS: 15 days A FACE TO FACE EVALUATION WAS PERFORMED  Horton Chin 06/19/2021, 5:39 PM

## 2021-06-19 NOTE — Progress Notes (Signed)
Physical Therapy Session Note  Patient Details  Name: Donald [name] MRN: [id number] Date of Birth: 03-14-1956  Today's Date: 06/19/2021 PT Individual Time: 0800-0855 PT Individual Time Calculation (min): 55 min   Short Term Goals: Week 1:  PT Short Term Goal 1 (Week 1): Pt will perform bed mobiltiy with max assist  of 1. PT Short Term Goal 1 - Progress (Week 1): Met PT Short Term Goal 2 (Week 1): Pt will tolerate sitting in WC >2 hours PT Short Term Goal 2 - Progress (Week 1): Met PT Short Term Goal 3 (Week 1): Pt will maintain sitting balance EOB with max assist up to 2 minutes PT Short Term Goal 3 - Progress (Week 1): Met PT Short Term Goal 4 (Week 1): Pt will transfer to [facility] with max assist of 2 consistently PT Short Term Goal 4 - Progress (Week 1): Met Week 2:  PT Short Term Goal 1 (Week 2): Pt will perform bed<>WC transfer with max assist of 1 PT Short Term Goal 1 - Progress (Week 2): Met PT Short Term Goal 2 (Week 2): Pt will perform gait training at rail in hall x 34ft with max A +2 PT Short Term Goal 2 - Progress (Week 2): Progressing toward goal PT Short Term Goal 3 (Week 2): Pt will perform WC propulsion x65ft and mod assist using hemi technique PT Short Term Goal 3 - Progress (Week 2): Progressing toward goal PT Short Term Goal 4 (Week 2): Pt will perform sitting balance EOB with mod assist up to 5 minutes PT Short Term Goal 4 - Progress (Week 2): Progressing toward goal Week 3:  PT Short Term Goal 1 (Week 3): Pt will perform bed mobility with modA +1. PT Short Term Goal 2 (Week 3): Pt will perform sitting balance EOB with modA >5 minutes. PT Short Term Goal 3 (Week 3): Pt will perform gait training  x 53ft with max A +2 PT Short Term Goal 4 (Week 3): Pt will perform WC propulsion x30ft and mod assist using hemi technique  Skilled Therapeutic Interventions/Progress Updates:  Pain:  Pt reports L LE and flank pain w/mobility.  Care taken, additional time given to adjust to  changes in positioning, Treatment to tolerance.  Rest breaks and repositioning as needed.  Pt initially supine and agreeable to treatment session w/focus on sitting balance, stretching, ADLs. Pt lifts R foot w/additional time and tactile cues to inititate during dressing.   Bridges w/the RLE placed in position, repeated mult times for therapist to raise shorts. Supine to side to sit w/max assist, additional time, initially leans L.  Worked on reaching to far R for L trunk elongation, midline orientation.  At times pt able to sit w/cga and RUE support but gradually loses balance to L w/max assist to recover, pt aware of balance loss but unable to react. In sitting, pt washes face w/set up and cues to attend to L side, overall min assist for balance, cues for upright posture, sits w/downward gaze and signficant cervical and thoracic flexion, able to self correct when cued to improved but not fully erect posture. Bed to wc via dependent sliding board transfer, tends to push against direction of transfer w//RUE when transferring to R. Once in wc, pt brushes teeth at sink w/set up but much difficulty and additional time to swish and spit out toothpaste/water, cueing. In wc worked on cervial AROM while pt discussed foods he enjoyed eating/[place] and explained basic preparation of foods. Pt alert thru full  hour session but falling asleep in wc at end of session.  Tilted in wc for safety. Pt left oob in wc w/alarm belt set and needs in reach  Therapy Documentation Precautions:  Precautions Precautions: Fall Precaution Comments: L hemiparesis, L inattention, pusher to the L, cognitive deficits, cortrak Restrictions Weight Bearing Restrictions: No    Therapy/Group: Individual Therapy Callie Fielding, [place] 06/19/2021, 8:57 AM

## 2021-06-20 LAB — GLUCOSE, CAPILLARY
Glucose-Capillary: 87 mg/dL (ref 70–99)
Glucose-Capillary: 97 mg/dL (ref 70–99)
Glucose-Capillary: 105 mg/dL — ABNORMAL HIGH (ref 70–99)
Glucose-Capillary: 129 mg/dL — ABNORMAL HIGH (ref 70–99)

## 2021-06-20 NOTE — Progress Notes (Signed)
Speech Language Pathology Daily Session Note  Patient Details  Name: Donald [name] MRN: [id number] Date of Birth: 1956-11-28  Today's Date: 06/20/2021 SLP Individual Time: 1102-1203 SLP Individual Time Calculation (min): 61 min  Short Term Goals: Week 3: SLP Short Term Goal 1 (Week 3): Patient will consume current diet with minimal overt s/s of aspiration and Min verbal cues for use of swallowing compensatory strategies. SLP Short Term Goal 2 (Week 3): Patient will locate/attend to left field of enviornment in 75% of opportunities with Mod A verbal cues. SLP Short Term Goal 3 (Week 3): Patient will orient to place, time and situation with Mod visual cues. SLP Short Term Goal 4 (Week 3): Patient will demonstrate sustained attention to functional tasks for 10 minutes with Mod verbal cues for recdirection. SLP Short Term Goal 5 (Week 3): Patient will demonstrate efficient mastication with complete oral clearance without overt s/s of aspiraiton with trials of Dys. 2 textures over 2 sessions with overall Min A verbal cues prior to  upgade.  Skilled Therapeutic Interventions: Pt seen for skilled ST with focus on cognitive and swallowing goals. SLP facilitating sustained attention during functional tasks including brushing teeth and self-feeding. Pt with episodes of closing eyes for prolonged periods while remaining awake and conversational, max cues from wife and SLP intermittently effective in getting patient to open eyes. Pt requiring mod fading to min A cues for sustained attention up to 5 minutes. Following oral care, pt tolerating trials of Dys 2 solids with minimally extended mastication and occasional double swallow, no overt s/s aspiration across any trials. Pt not fond of Dys 2 options presented to him (fresh soft strawberries, muffin) so trials were limited this date. Recommend wife bringing in food from home consistent with Dys 2 textures to trial during ST sessions only to increase pt  motivation. Pt left in bed with wife present and all needs met. Cont ST POC.   Pain Pain Assessment Pain Scale: 0-10 Pain Score: 0-No pain Pain Type: Acute pain Pain Location: Generalized Pain Orientation: Left Pain Descriptors / Indicators: Aching;Constant Pain Frequency: Constant Pain Onset: On-going Patients Stated Pain Goal: 0 Pain Intervention(s): Medication (See eMAR) (scheduled lidocane)  Therapy/Group: Individual Therapy  [name] 06/20/2021, 12:04 PM

## 2021-06-20 NOTE — Progress Notes (Signed)
PROGRESS NOTE   Subjective/Complaints: "Feeling much better." Listening to gospel music. No complaints. Sitting upright in bed.   ROS: Limited. Lid/t memory, cognition   Objective:   No results found. No results for input(s): WBC, HGB, HCT, PLT in the last 72 hours.  No results for input(s): NA, K, CL, CO2, GLUCOSE, BUN, CREATININE, CALCIUM in the last 72 hours.    Intake/Output Summary (Last 24 hours) at 06/20/2021 1817 Last data filed at 06/20/2021 1338 Gross per 24 hour  Intake 400 ml  Output --  Net 400 ml         Physical Exam: Vital Signs Blood pressure 115/81, pulse 91, temperature 97.7 F (36.5 C), temperature source Oral, resp. rate 16, height 5\' 5"  (1.651 m), weight 59.8 kg, SpO2 100 %. Gen: no distress, normal appearing HEENT: oral mucosa pink and moist, NCAT Cardio: Reg rate Chest: normal effort, normal rate of breathing Abd: soft, non-distended Ext: no edema Psych: pleasant and cooperative, confused Neurological:    Comments: left C7.  More alert.  follows basic commands. persistent STM deficits. Left inattention. Dense left hemiparesis without change. RUE and RLE 4/5. Decreased LT Left side  ongoing. DTR's 3+ on left side Musc: no pain with ROM  Assessment/Plan: 1. Functional deficits which require 3+ hours per day of interdisciplinary therapy in a comprehensive inpatient rehab setting. Physiatrist is providing close team supervision and 24 hour management of active medical problems listed below. Physiatrist and rehab team continue to assess barriers to discharge/monitor patient progress toward functional and medical goals  Care Tool:  Bathing    Body parts bathed by patient: Face   Body parts bathed by helper: Right arm, Left arm, Chest, Abdomen, Front perineal area, Buttocks, Right upper leg, Left upper leg, Right lower leg, Left lower leg     Bathing assist Assist Level: 2 Helpers      Upper Body Dressing/Undressing Upper body dressing   What is the patient wearing?: Pull over shirt    Upper body assist Assist Level: Total Assistance - Patient < 25%    Lower Body Dressing/Undressing Lower body dressing      What is the patient wearing?: Pants     Lower body assist Assist for lower body dressing: Maximal Assistance - Patient 25 - 49%     Toileting Toileting    Toileting assist Assist for toileting: 2 Helpers     Transfers Chair/bed transfer  Transfers assist     Chair/bed transfer assist level: Maximal Assistance - Patient 25 - 49%     Locomotion Ambulation   Ambulation assist   Ambulation activity did not occur: Safety/medical concerns  Assist level: 2 helpers Assistive device:  (3 musketeers) Max distance: 25'   Walk 10 feet activity   Assist  Walk 10 feet activity did not occur: Safety/medical concerns  Assist level: 2 helpers Assistive device:  (3 musketeers)   Walk 50 feet activity   Assist Walk 50 feet with 2 turns activity did not occur: Safety/medical concerns         Walk 150 feet activity   Assist Walk 150 feet activity did not occur: Safety/medical concerns  Walk 10 feet on uneven surface  activity   Assist Walk 10 feet on uneven surfaces activity did not occur: Safety/medical concerns         Wheelchair     Assist Will patient use wheelchair at discharge?: Yes Type of Wheelchair: Manual    Wheelchair assist level: Dependent - Patient 0% Max wheelchair distance: 150    Wheelchair 50 feet with 2 turns activity    Assist        Assist Level: Dependent - Patient 0%   Wheelchair 150 feet activity     Assist      Assist Level: Dependent - Patient 0%   Blood pressure 115/81, pulse 91, temperature 97.7 F (36.5 C), temperature source Oral, resp. rate 16, height 5\' 5"  (1.651 m), weight 59.8 kg, SpO2 100 %.    Medical Problem List and Plan: 1.  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              -patient may shower             -ELOS/Goals: MinA 3-4weeks  -Continue CIR therapies including PT, OT, and SLP         2.  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 Wife prefers no oral anticoagulation given hematoma.  -DVT/anticoagulation:  Mechanical:  Antiembolism stockings, knee (TED hose) Bilateral lower extremities             -antiplatelet therapy: N/A due to concerns of CAA 3. Left shoulder pain: continue lidocaine patch 4. Decreased arousal:    -reduce amantadine to 100mg  qd. Continue ritalin 5mg  bid for arousal/initiaton             -antipsychotic agents: N/A 5. Neuropsych: This patient is not fully capable of making decisions on his own behalf. 6. Skin/Wound Care: Routine pressure relief measures. 7. Fluids/Electrolytes/Nutrition: monitor fluid intake while on PO alone  -I's/O's sl + 7/7 -recheck bmet Monday  . 8. Recurrent aspiration event:    -unasyn started 6/20---stopped 6/30             -- Wbc's 8k             --CXR recently stable.   Lungs clear, much improved! 9. Leucocytosis: resolved 10. Blood pressure: Monitor BID--controlled without meds at this time. Vitals:   06/20/21 0453 06/20/21 1337  BP: (!) 142/94 115/81  Pulse: 92 91  Resp: 18 16  Temp: 98.1 F (36.7 C) 97.7 F (36.5 C)  SpO2: 100% 100%   Controlled 7/10 11. Progressive cognitive decline: To reschedule dementia work up at [facility] after d/c. 12. Hyperglycemia: Hgb A1C- 5.8.             --Monitor BS every  4 hours and use SSI for elevated  BS   CBG (last 3)  Recent Labs    06/20/21 0635 06/20/21 1131 06/20/21 1636  GLUCAP 87 97 105*  D/c ensure (wife throwing them away): wife to bring in smoothies from home. Off Lantus. CBGs 87-105. Decrease CBGs to daily.  13. Dysphagia: MBS this morning. D1/thins diet initiated!08/21/21             --oral care every 4 hours while awake.  -off TF  7/2  -megace trial started  7/8 tolerating diet   -eating most of meals now! 14. Tachycardia: 90's - monitor HR TID 15. Elevated liver transaminases: Liver u/s with one small cystic structure.   -all LFT's nearly back wnl  -  made Tylenol PRN as per wife's request 16. Mild anemia-Ferritin elevated but Hgb, Fe++ and TIBC all low which point towards anemia of chronic disease.  17. Lower back pain: kpad ordered.      LOS: 16 days A FACE TO FACE EVALUATION WAS PERFORMED  Horton Chin 06/20/2021, 6:17 PM

## 2021-06-21 LAB — GLUCOSE, CAPILLARY: Glucose-Capillary: 107 mg/dL — ABNORMAL HIGH (ref 70–99)

## 2021-06-21 MED ORDER — GABAPENTIN 100 MG PO CAPS
100.0000 mg | ORAL_CAPSULE | Freq: Three times a day (TID) | ORAL | Status: DC
  Administered 2021-06-21 – 2021-06-25 (×12): 100 mg via ORAL
  Filled 2021-06-21 (×12): qty 1

## 2021-06-21 MED ORDER — AMANTADINE HCL 50 MG/5ML PO SOLN
100.0000 mg | Freq: Two times a day (BID) | ORAL | Status: DC
  Administered 2021-06-21 – 2021-06-22 (×2): 100 mg via ORAL
  Filled 2021-06-21 (×2): qty 10

## 2021-06-21 NOTE — Progress Notes (Signed)
Occupational Therapy Weekly Progress Note  Patient Details  Name: Donald [name] MRN: [id number] Date of Birth: 06/03/1956  Beginning of progress report period: June 05, 2021 End of progress report period: June 21, 2021  Today's Date: 06/21/2021 Session 1 OT Individual Time: 7342-8768 OT Individual Time Calculation (min): 90 min   Session 2 OT Individual Time: 1345-1400 OT Individual Time Calculation (min): 15 min    Patient has met 3 of 3 short term goals.  Patient has made moderate progress towards OT goals this week. Pt continues to need max A +2 for functional transfers, but was able to get standing enough for max+2 stand-pivot transfer. Overall attention and initiation within functional tasks is improving as well. L UE continues to be flaccid with continued innattention to L side.   Patient continues to demonstrate the following deficits: muscle weakness and muscle joint tightness, decreased cardiorespiratoy endurance, impaired timing and sequencing, abnormal tone, unbalanced muscle activation, motor apraxia, ataxia, decreased coordination, and decreased motor planning, decreased midline orientation, decreased attention to left, left side neglect, and decreased motor planning, decreased initiation, decreased attention, decreased awareness, decreased problem solving, decreased safety awareness, decreased memory, and delayed processing, and decreased sitting balance, decreased standing balance, decreased postural control, hemiplegia, and decreased balance strategies and therefore will continue to benefit from skilled OT intervention to enhance overall performance with BADL.  Patient progressing toward long term goals..  Continue plan of care.  OT Short Term Goals Week 2:  OT Short Term Goal 1 (Week 2): Pt will don shirt with max A in supported sitting. OT Short Term Goal 1 - Progress (Week 2): Met OT Short Term Goal 2 (Week 2): Pt will maintain midline orientation in supported sitting  with overall max A. OT Short Term Goal 2 - Progress (Week 2): Met OT Short Term Goal 3 (Week 2): Pt will completed grooming task with mod A in supported sitting. OT Short Term Goal 3 - Progress (Week 2): Met Week 3:  OT Short Term Goal 1 (Week 3): Patient will complete toilet transfer with max A of 1 OT Short Term Goal 2 (Week 3): Pt will recall hemi dressing techniques with min questioning cues OT Short Term Goal 3 (Week 3): Patient will complete 1 step of UB dressing task  Skilled Therapeutic Interventions/Progress Updates:    Session 1 Pt greeted seated in TIS wc and agreeable to OT treatment session. Pt reported need to go to the bathroom. Pt needed 2 trials to achieve sit<>stand with max A, then max A to pivot over to [facility]. +2 needed for clothing management and hygiene as 1 person assisted with standing. Pt with successful BM and voided bladder. LB bathing completed while seated in BSC with max A. +2 again for clothing management after bathing for 1 person to stand and +2 to pull up pants. Max A to stand-pivot back to TIS wc. Pt brought to the sink for UB bathing/dressing. Worked on L visual scanning to locate items at the sink and hand over hand to integrate L UE for neuro re-ed. Reviewed hemi dressing techniques but still needed max A and max multimodal cues. Shaving task completed at the sink with Min A from OT to make sure he got the L side of his face. OT set pt up with lunch. Pt said he was not hungry but agreed to take a bite of ice cream. Ice cream placed on L side of tray and pt needed max multimodal cues to look L and locate food.  Pt with improved initiation to swallow food bolus. OT placed SAEBO e-stim on shoulder. SAEBO left on for 60 minutes. OT returned to remove SAEBO with skin intact and no adverse reactions.  [place] Stim One 330 pulse width 35 Hz pulse rate On 8 sec/ off 8 sec Ramp up/ down 2 sec Symmetrical Biphasic wave form  Max intensity 13mA at 500 Ohm load  Session 2 Pt  greeted seated in TIS wc and agreeable to OT treatment session. Pt wanted to get back in the bed. Max A of 1 Stand-pivot back to bed. Max A to lift Les back in bed. OT provided gentle stretching and ROM to L UE 2/2 strong flexor tone. Pt left semi-reclined in bed with bed alarm on, call bell in reach, and needs met.   Therapy Documentation Precautions:  Precautions Precautions: Fall Precaution Comments: L hemiparesis, L inattention, pusher to the L, cognitive deficits, cortrak Restrictions Weight Bearing Restrictions: No Pain:   Pt reports pain in L UE with ROM. Rest and repositioned for comfort.   Therapy/Group: Individual Therapy  Valma Cava 06/21/2021, 2:06 PM

## 2021-06-21 NOTE — Progress Notes (Signed)
PROGRESS NOTE   Subjective/Complaints: No new complaints. Has pain in left arm/leg.   ROS: Patient denies fever, rash, sore throat, blurred vision, nausea, vomiting, diarrhea, cough, shortness of breath or chest pain,   headache, or mood change.    Objective:   No results found. No results for input(s): WBC, HGB, HCT, PLT in the last 72 hours.  No results for input(s): NA, K, CL, CO2, GLUCOSE, BUN, CREATININE, CALCIUM in the last 72 hours.    Intake/Output Summary (Last 24 hours) at 06/21/2021 0937 Last data filed at 06/21/2021 0806 Gross per 24 hour  Intake 260 ml  Output --  Net 260 ml         Physical Exam: Vital Signs Blood pressure (!) 139/93, pulse 91, temperature 98.2 F (36.8 C), temperature source Oral, resp. rate 18, height 5\' 5"  (1.651 m), weight 60.6 kg, SpO2 100 %. Constitutional: No distress . Vital signs reviewed. HEENT: EOMI, oral membranes moist Neck: supple Cardiovascular: RRR without murmur. No JVD    Respiratory/Chest: CTA Bilaterally without wheezes or rales. Normal effort    GI/Abdomen: BS +, non-tender, non-distended Ext: no clubbing, cyanosis, or edema Psych: pleasant and cooperative  Neurological:    Comments: left C7.  More alert.  follows basic commands. persistent STM deficits. Left inattention. Dense left hemiparesis without change. RUE and RLE 4/5. Decreased LT Left side  ongoing. DTR's 3+ on left side. Early tone MAS 1+/4 LUE and LLE.  Musc: both left arm and leg tender with ROM today  Assessment/Plan: 1. Functional deficits which require 3+ hours per day of interdisciplinary therapy in a comprehensive inpatient rehab setting. Physiatrist is providing close team supervision and 24 hour management of active medical problems listed below. Physiatrist and rehab team continue to assess barriers to discharge/monitor patient progress toward functional and medical goals  Care  Tool:  Bathing    Body parts bathed by patient: Face   Body parts bathed by helper: Right arm, Left arm, Chest, Abdomen, Front perineal area, Buttocks, Right upper leg, Left upper leg, Right lower leg, Left lower leg     Bathing assist Assist Level: 2 Helpers     Upper Body Dressing/Undressing Upper body dressing   What is the patient wearing?: Pull over shirt    Upper body assist Assist Level: Total Assistance - Patient < 25%    Lower Body Dressing/Undressing Lower body dressing      What is the patient wearing?: Pants     Lower body assist Assist for lower body dressing: Maximal Assistance - Patient 25 - 49%     Toileting Toileting    Toileting assist Assist for toileting: 2 Helpers     Transfers Chair/bed transfer  Transfers assist     Chair/bed transfer assist level: Maximal Assistance - Patient 25 - 49%     Locomotion Ambulation   Ambulation assist   Ambulation activity did not occur: Safety/medical concerns  Assist level: 2 helpers Assistive device:  (3 musketeers) Max distance: 25'   Walk 10 feet activity   Assist  Walk 10 feet activity did not occur: Safety/medical concerns  Assist level: 2 helpers Assistive device:  (3 musketeers)   Walk  50 feet activity   Assist Walk 50 feet with 2 turns activity did not occur: Safety/medical concerns         Walk 150 feet activity   Assist Walk 150 feet activity did not occur: Safety/medical concerns         Walk 10 feet on uneven surface  activity   Assist Walk 10 feet on uneven surfaces activity did not occur: Safety/medical concerns         Wheelchair     Assist Will patient use wheelchair at discharge?: Yes Type of Wheelchair: Manual    Wheelchair assist level: Dependent - Patient 0% Max wheelchair distance: 150    Wheelchair 50 feet with 2 turns activity    Assist        Assist Level: Dependent - Patient 0%   Wheelchair 150 feet activity      Assist      Assist Level: Dependent - Patient 0%   Blood pressure (!) 139/93, pulse 91, temperature 98.2 F (36.8 C), temperature source Oral, resp. rate 18, height 5\' 5"  (1.651 m), weight 60.6 kg, SpO2 100 %.    Medical Problem List and Plan: 1.  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              -patient may shower             -ELOS/Goals: 7/22, min assist goals  -Continue CIR therapies including PT, OT, and SLP         2.  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 Wife prefers no oral anticoagulation given hematoma.  -DVT/anticoagulation:  Mechanical:  Antiembolism stockings, knee (TED hose) Bilateral lower extremities             -antiplatelet therapy: N/A due to concerns of CAA 3. Left sided limb pain/low back pain: increased tone, suspect neuropathic component - continue lidocaine patch -trial of gabapentin 100mg  tid -kpad for back 4. Decreased arousal:    -reduce amantadine to 100mg  qd. Continue ritalin 5mg  bid for arousal/initiaton             -antipsychotic agents: N/A 5. Neuropsych: This patient is not fully capable of making decisions on his own behalf. 6. Skin/Wound Care: Routine pressure relief measures. 7. Fluids/Electrolytes/Nutrition: monitor fluid intake while on PO alone  -I's/O's sl + 7/7 -recheck cmet tomorrow . 8. Recurrent aspiration event:    -unasyn started 6/20---stopped 6/30             -- Wbc's 8k             --CXR recently stable.   Lungs clear, much improved! 9. Leucocytosis: resolved 10. Blood pressure: Monitor BID--controlled without meds at this time. Vitals:   06/20/21 2004 06/21/21 0406  BP: 117/80 (!) 139/93  Pulse: 63 91  Resp: 18 18  Temp: 98.4 F (36.9 C) 98.2 F (36.8 C)  SpO2: 100% 100%   Controlled generally 7/11 11. Progressive cognitive decline: To reschedule dementia work up at [facility] after d/c. 12. Hyperglycemia: Hgb A1C- 5.8.              --Monitor BS every  4 hours and use SSI for elevated  BS   CBG (last 3)  Recent Labs    06/20/21 1131 06/20/21 1636 06/20/21 2053  GLUCAP 97 105* 129*  Controlled, check daily only.  13. Dysphagia: MBS this morning. D1/thins diet initiated!08/21/21  7/2 -megace trial started  7/8 -eating most of meals now! 14. Tachycardia: 90's - monitor HR TID 15. Elevated liver transaminases: Liver u/s with one small cystic structure.   -all LFT's nearly back wnl  -made Tylenol PRN as per wife's request 16. Mild anemia-Ferritin elevated but Hgb, Fe++ and TIBC all low which point towards anemia of chronic disease.         LOS: 17 days A FACE TO FACE EVALUATION WAS PERFORMED  Ranelle Oyster 06/21/2021, 9:37 AM

## 2021-06-21 NOTE — Progress Notes (Signed)
Speech Language Pathology Daily Session Note  Patient Details  Name: Donald [name] MRN: [id number] Date of Birth: [birth year]  Today's Date: 06/21/2021 SLP Individual Time: 0720-0805 SLP Individual Time Calculation (min): 45 min  Short Term Goals: Week 3: SLP Short Term Goal 1 (Week 3): Patient will consume current diet with minimal overt s/s of aspiration and Min verbal cues for use of swallowing compensatory strategies. SLP Short Term Goal 2 (Week 3): Patient will locate/attend to left field of enviornment in 75% of opportunities with Mod A verbal cues. SLP Short Term Goal 3 (Week 3): Patient will orient to place, time and situation with Mod visual cues. SLP Short Term Goal 4 (Week 3): Patient will demonstrate sustained attention to functional tasks for 10 minutes with Mod verbal cues for recdirection. SLP Short Term Goal 5 (Week 3): Patient will demonstrate efficient mastication with complete oral clearance without overt s/s of aspiraiton with trials of Dys. 2 textures over 2 sessions with overall Min A verbal cues prior to  upgade.  Skilled Therapeutic Interventions:  Skilled treatment session focused on dysphagia and cognitive goals. SLP facilitated session by providing overall Min A verbal cues for selective attention to self-feeding and Min-Mod A verbal cues for visual scanning to his left field of environment to locate items on his table. Patient with intermittent language of confusion throughout session today. Patient consumed breakfast meal of dys. 1 textures with thin liquids without overt s/s of aspiration and required supervision level verbal cues for use of swallowing strategies. Patient also demonstrated mildly prolonged mastication of dys. 2 textures with minimal oral residue. Recommend trial tray prior to upgrade. Patient left upright in bed with alarm on and all needs within reach. Continue with current plan of care.      Pain Pain Assessment Pain Scale: 0-10 Pain Score: 0-No  pain  Therapy/Group: Individual Therapy  Donald [name] 06/21/2021, 12:18 PM

## 2021-06-21 NOTE — Progress Notes (Signed)
Pt slept through the night.

## 2021-06-21 NOTE — Progress Notes (Signed)
Physical Therapy Session Note  Patient Details  Name: Donald [name] MRN: [id number] Date of Birth: October 12, 1956  Today's Date: 06/21/2021 PT Individual Time: 0905-1000 PT Individual Time Calculation (min): 55 min   Short Term Goals: Week 3:  PT Short Term Goal 1 (Week 3): Pt will perform bed mobility with modA +1. PT Short Term Goal 2 (Week 3): Pt will perform sitting balance EOB with modA >5 minutes. PT Short Term Goal 3 (Week 3): Pt will perform gait training  x 53ft with max A +2 PT Short Term Goal 4 (Week 3): Pt will perform WC propulsion x40ft and mod assist using hemi technique  Skilled Therapeutic Interventions/Progress Updates:     Patient in bed upon PT arrival. Patient alert and agreeable to PT session. Patient reported un-rated L hemibody pain with mobility throughout session, RN made aware. PT provided repositioning, rest breaks, and distraction as pain interventions throughout session.   Noted kinesiotape applied last Monday still on patient's L shoulder. Used saturated wet washcloth to remove tape for improved skin integrity and pain management with removal.   Therapeutic Activity: Bed Mobility: Patient performed rolling R/L with max A +2 to don shorts. He then requested to have a BM and was agreeable to transferring to the [facility]. He performed supine to sit with min/mod A and sat EOB with min A progressing to supervision. Provided verbal cues for placing his R hand on his knee to prevent pushing to the L, lowered bed rail to prevent patient from reaching for this. Transfers: Patient performed stand pivot bed<>BSC and bed>TIS w/c with max A of 1 person and a second person min A-CGA for safety/balance. Provided mod-max multimodal cues for forward weight shift, hip/knee/trunk extension, and sequencing for stepping and weight shifting. Patient requires increased time for initiation with mobility. Patient was unsuccessful with BM on BSC with increased internal vs external distraction.  Performed peri-care and lower body clothing management with total A with patient in standing with mod/min A for standing balance with single upper extremity support.  Neuromuscular Re-ed: Patient performed the following functional motor control and standing balance activities: -sit to/from stand at the sink with mod-min A +2 with facilitation for forward weight shift and gluteal activation and mirror in front for visual feedback; provided hand-over-hand assist for L hand placement on the sink and approximation through the wrist to promote weight bearing through his L hand -standing balance >4 min, as above, focused on erect posture, cervical extension, gluteal and quad activation, L upper extremity weight bearing with elbow extension, and L trunk elongation -standing balance as above for 2 min progressing to weight shifting R/L and extending he L leg to push his body to the R for self-initiation of weight shift  Patient in TIS w/c with L arm elevated in trough at end of session with breaks locked, seat blet alarm set, and all needs within reach.   Therapy Documentation Precautions:  Precautions Precautions: Fall Precaution Comments: L hemiparesis, L inattention, pusher to the L, cognitive deficits, cortrak Restrictions Weight Bearing Restrictions: No    Therapy/Group: Individual Therapy  Rim Thatch L Lexi Conaty PT, DPT  06/21/2021, 4:14 PM

## 2021-06-22 ENCOUNTER — Inpatient Hospital Stay (HOSPITAL_COMMUNITY): Payer: No Typology Code available for payment source

## 2021-06-22 LAB — COMPREHENSIVE METABOLIC PANEL
ALT: 38 U/L (ref 0–44)
AST: 24 U/L (ref 15–41)
Albumin: 2.9 g/dL — ABNORMAL LOW (ref 3.5–5.0)
Alkaline Phosphatase: 225 U/L — ABNORMAL HIGH (ref 38–126)
Anion gap: 6 (ref 5–15)
BUN: 7 mg/dL — ABNORMAL LOW (ref 8–23)
CO2: 24 mmol/L (ref 22–32)
Calcium: 9.3 mg/dL (ref 8.9–10.3)
Chloride: 106 mmol/L (ref 98–111)
Creatinine, Ser: 0.82 mg/dL (ref 0.61–1.24)
GFR, Estimated: >60 mL/min (ref >60)
Glucose, Bld: 102 mg/dL — ABNORMAL HIGH (ref 70–99)
Potassium: 3.4 mmol/L — ABNORMAL LOW (ref 3.5–5.1)
Sodium: 136 mmol/L (ref 135–145)
Total Bilirubin: 0.5 mg/dL (ref 0.3–1.2)
Total Protein: 6.8 g/dL (ref 6.5–8.1)

## 2021-06-22 LAB — CBC
HCT: 40.8 % (ref 39.0–52.0)
Hemoglobin: 13.4 g/dL (ref 13.0–17.0)
MCH: 30.5 pg (ref 26.0–34.0)
MCHC: 32.8 g/dL (ref 30.0–36.0)
MCV: 92.7 fL (ref 80.0–100.0)
Platelets: 251 10*3/uL (ref 150–400)
RBC: 4.4 MIL/uL (ref 4.22–5.81)
RDW: 14.6 % (ref 11.5–15.5)
WBC: 5.1 10*3/uL (ref 4.0–10.5)
nRBC: 0 % (ref 0.0–0.2)

## 2021-06-22 LAB — GLUCOSE, CAPILLARY: Glucose-Capillary: 97 mg/dL (ref 70–99)

## 2021-06-22 MED ORDER — TROLAMINE SALICYLATE 10 % EX CREA
TOPICAL_CREAM | Freq: Two times a day (BID) | CUTANEOUS | Status: DC
  Filled 2021-06-22: qty 85

## 2021-06-22 MED ORDER — PROSOURCE PLUS PO LIQD
30.0000 mL | Freq: Two times a day (BID) | ORAL | Status: DC
  Administered 2021-06-22 – 2021-06-29 (×12): 30 mL via ORAL
  Filled 2021-06-22 (×17): qty 30

## 2021-06-22 MED ORDER — AMANTADINE HCL 50 MG/5ML PO SOLN
100.0000 mg | Freq: Every day | ORAL | Status: DC
  Administered 2021-06-23 – 2021-06-25 (×3): 100 mg via ORAL
  Filled 2021-06-22 (×3): qty 10

## 2021-06-22 MED ORDER — BOOST / RESOURCE BREEZE PO LIQD CUSTOM
1.0000 | Freq: Every day | ORAL | Status: DC
  Administered 2021-06-22 – 2021-07-05 (×7): 1 via ORAL

## 2021-06-22 MED ORDER — MUSCLE RUB 10-15 % EX CREA
TOPICAL_CREAM | Freq: Two times a day (BID) | CUTANEOUS | Status: DC
  Administered 2021-07-05: 1 via TOPICAL
  Filled 2021-06-22: qty 85

## 2021-06-22 MED ORDER — MEGESTROL ACETATE 400 MG/10ML PO SUSP
400.0000 mg | Freq: Every day | ORAL | Status: DC
  Administered 2021-06-23 – 2021-07-01 (×8): 400 mg via ORAL
  Filled 2021-06-22 (×9): qty 10

## 2021-06-22 NOTE — Progress Notes (Signed)
Patient ID: Donald [name], male   DOB: 05/30/1956, 65 y.o.   MRN: [id number]  SW met with pt and pt wife in room to provide updates from team conference including gains made in rehab, continued physical support pt will require at discharge, [facility] preference, and scheduling family education. Pt wife will speak with her son about coming in for family education. SW to provide [facility] list.   *SW gave [facility]HA list to pt wife, and SW will f/u about preference.   Loralee Pacas, MSW, [place] Office: [phone] Cell: [phone] Fax: [phone]

## 2021-06-22 NOTE — Progress Notes (Signed)
Physical Therapy Session Note  Patient Details  Name: Donald [name] MRN: [id number] Date of Birth: [birth year]  Today's Date: 06/22/2021 PT Individual Time: 0902-0959 PT Individual Time Calculation (min): 57 min   Short Term Goals: Week 3:  PT Short Term Goal 1 (Week 3): Pt will perform bed mobility with modA +1. PT Short Term Goal 2 (Week 3): Pt will perform sitting balance EOB with modA >5 minutes. PT Short Term Goal 3 (Week 3): Pt will perform gait training  x 59ft with max A +2 PT Short Term Goal 4 (Week 3): Pt will perform WC propulsion x49ft and mod assist using hemi technique  Skilled Therapeutic Interventions/Progress Updates:     Pt received supine in bed and agrees to therapy. Initially no complaint of pain. Supine to sit with maxA and cues for logrolling and body mechanics. Pt beings grimacing in pain and rubbing L anterior thigh. PT provides rest break to allow for pain control. Pt performs squat pivot transfer to WC with modA. WC transport to gym for time management. Pt transfers to Nustep with modA. Nustep performed with intent of providing some strengthening and reciprocal coordination training as well as gentle ROM of both legs to ease tone and pain provocation. PT attempts to provide manual assistance with Nustep but pt again grimaces in pain and cannot tolerate. When performing without assistance, pt moves Nustep through extremely small ROM on workload of 1 and continues to complain of pain, in both R leg and L leg.   Pt performs sit to stand with modA +2, then ambulates with 3 musketeers technique x10', with PT providing total management of L leg and blocking during stance phase, also facilitating lateral weight shifting to both L and R. Pt has poor trunk control and PT utilizes own head to provide facilitation of trunk extension.  Following seated rest break, pt transfer to [facility] with stand step technique, ambulating 5'. Pt left seated in WC with alarm intact and all needs within  reach.  Therapy Documentation Precautions:  Precautions Precautions: Fall Precaution Comments: L hemiparesis, L inattention, pusher to the L, cognitive deficits, cortrak Restrictions Weight Bearing Restrictions: No   Therapy/Group: Individual Therapy  Beau Fanny, PT, DPT 06/22/2021, 4:11 PM

## 2021-06-22 NOTE — Progress Notes (Signed)
Occupational Therapy Session Note  Patient Details  Name: Donald [name] MRN: [id number] Date of Birth: 11/27/1956  Today's Date: 06/22/2021 Session 1 OT Individual Time: 1100-1145 OT Individual Time Calculation (min): 45 min   Session 2 OT Individual Time: 0141-0301 OT Individual Time Calculation (min): 28 min    Short Term Goals: Week 3:  OT Short Term Goal 1 (Week 3): Patient will complete toilet transfer with max A of 1 OT Short Term Goal 2 (Week 3): Pt will recall hemi dressing techniques with min questioning cues OT Short Term Goal 3 (Week 3): Patient will complete 1 step of UB dressing task  Skilled Therapeutic Interventions/Progress Updates:  Session 1   Pt greeted seated in TIS wc and agreeable to OT treatment session focused on self-care retraining. Pt wanted to wash up and change clothes. Pt brought to the sink and worked on sit<>stands for LB bathing/dressing. PT needed mod A to stand with improved power up today. +2 needed to assist with clothing management and washing buttocks/peri-area. Pt with lateral lean to the L in standing. Used mirror feedback and NDT techniques to facilitate more upright posture. Attempted figure 4 position but both pt's hips very tight and unable to achieve. Total A to thread pants, then mod A again to stand w/ verbal cues for hand placement. UB bathing/dressing seated in wc with focus on core strength to lean forward and collect items. Worked on visual scanning and L attention to locate warm water, items on L side, and L arm placement. OT placed SAEBO e-stim on shoulder. SAEBO left on for 60 minutes. OT returned to remove SAEBO with skin intact and no adverse reactions.  Sa[place]o Stim One 330 pulse width 35 Hz pulse rate On 8 sec/ off 8 sec Ramp up/ down 2 sec Symmetrical Biphasic wave form  Max intensity 172mA at 500 Ohm load  Pt left seated in TIS wc with alarm belt on, call bell in reach, and needs met.   Session 2 Pt greeted sitting in TIS  wc and agreeable to OT treatment session. Pt brought down to therapy gym and OT provided gentle massage, stretching and ROM to L UE. Pt wrist, elbow, pecs were very tight, but ROM improved with these techniques. Educated on self-ROM. Pt needed max verbal cues to look L and loate L UE and OT assist to initially keep his grip on L hand (using R). Pt eventually able to demonstrate self ROM with elbow flex/ext and slight shoulder FF. 3 sets of 10. Pt returned to room and completed max A stand-pivot back to bed with some more pusher tendencies noted from fatigue. Pt left semi-reclined in bed with bed alarm on, call bell in reach, and needs met.   Therapy Documentation Precautions:  Precautions Precautions: Fall Precaution Comments: L hemiparesis, L inattention, pusher to the L, cognitive deficits, cortrak Restrictions Weight Bearing Restrictions: No  Pain:  Pain in L shoulder, rest and repositioned for comfort.   Therapy/Group: Individual Therapy  Valma Cava 06/22/2021, 2:06 PM

## 2021-06-22 NOTE — Progress Notes (Signed)
Speech Language Pathology Daily Session Note  Patient Details  Name: Donald [name] MRN: [id number] Date of Birth: 1956/11/21  Today's Date: 06/22/2021 SLP Individual Time: 0700-0750 SLP Individual Time Calculation (min): 50 min  Short Term Goals: Week 3: SLP Short Term Goal 1 (Week 3): Patient will consume current diet with minimal overt s/s of aspiration and Min verbal cues for use of swallowing compensatory strategies. SLP Short Term Goal 2 (Week 3): Patient will locate/attend to left field of enviornment in 75% of opportunities with Mod A verbal cues. SLP Short Term Goal 3 (Week 3): Patient will orient to place, time and situation with Mod visual cues. SLP Short Term Goal 4 (Week 3): Patient will demonstrate sustained attention to functional tasks for 10 minutes with Mod verbal cues for recdirection. SLP Short Term Goal 5 (Week 3): Patient will demonstrate efficient mastication with complete oral clearance without overt s/s of aspiraiton with trials of Dys. 2 textures over 2 sessions with overall Min A verbal cues prior to  upgade.  Skilled Therapeutic Interventions: Skilled treatment session focused on dysphagia and cognitive goals. Upon arrival, patient was awake and alert. Patient required increased cueing for diet initiation as well as attention to self-feeding this meal. Patient consumed current diet of Dys. 2 textures with thin liquids without overt s/s of aspiration. Recommend patient continue current diet. Throughout meal, patient required Mod verbal cues for visual scanning to left field of environment with mild language of confusion noted. Patient requested to use the bathroom and required Min verbal cues for awareness of bedpan. Patient unable to have a bowel movement but was continent of urine. Patient left upright in bed with alarm on and all needs within reach. Continue with current plan of care.       Pain Pain Assessment Pain Scale: 0-10 Pain Score: 0-No  pain  Therapy/Group: Individual Therapy  Arjay Jaskiewicz 06/22/2021, 10:02 AM

## 2021-06-22 NOTE — Patient Care Conference (Signed)
Inpatient RehabilitationTeam Conference and Plan of Care Update Date: 06/22/2021   Time: 10:31 AM    Patient Name: Donald [name]      Medical Record Number: [id number]  Date of Birth: 04/30/1956 Sex: Male         Room/Bed: 4W05C/4W05C-01 Payor Info: Payor: Advertising copywriter / Plan: [employer]HA / Product Type: *No Product type* /    Admit Date/Time:  06/04/2021  4:18 PM  Primary Diagnosis:  Intraparenchymal hematoma of brain [facility])  Hospital Problems: Principal Problem:   Intraparenchymal hematoma of brain [facility]) Active Problems:   Transaminitis    Expected Discharge Date: Expected Discharge Date: 07/02/21  Team Members Present: Physician leading conference: Dr. Faith Rogue Care Coodinator Present: Cecile Sheerer, LCSWA;Dylin Ihnen Marlyne Beards, RN, BSN, CRRN Nurse Present: Kennyth Arnold, RN PT Present: Malachi Pro, PT OT Present: Kearney Hard, OT SLP Present: Feliberto Gottron, SLP PPS Coordinator present : Fae Pippin, SLP     Current Status/Progress Goal Weekly Team Focus  Bowel/Bladder   Incontinent of bowel and bladder: LBM 06/21/21  To become continent of bowel and bladder with minimal assistance  Assess bowel and bladder needs q shift and prn and offer toileting q 2 hours while awake.   Swallow/Nutrition/ Hydration   Dys. 2 textures with thin liquids, Min A  Min A  tolerance of diet upgrade, use of swallow strategies   ADL's   Max A overall, max A stsand-pivots  Min/mod A  L UE NMR, NMES, transfers, L attention, sitting balance, self-care retraining   Mobility   mod-min A +1-2 for bed mobility and transfers, requires increased time for initiation and limited by L hemibody pain/sensistivity and flexor tone  minA to modA  gait training, increased standing activities, L NMR, balance, transfers, patient/caregiver education   Communication   Min-Mod A  Min A  use of speech intelligibility strategies, RMT   Safety/Cognition/ Behavioral Observations  Mod A  Min A  attention,  orientation, left visual scanning   Pain   Pt rates pain 0/10.  Pt rates pain <3/10.  Assess pain q shift and provide prn pain medication as requested and observed.   Skin   MASD to buttocks.  Remain free from skin breakdown and infection.  Assess skin q shift and prn.     Discharge Planning:  D/c to home with 24/7 care from wife who works from home. PRN support from children.   Team Discussion: Eating well, speech is better, pain better. Diffuse left side pain. Incontinent B/B.  Patient on target to meet rehab goals: yes, max assist for transfers, +2 for toileting, min assist for stand pivot transfers. Really limited by pain in the left leg. Today had complaints of pain in the right leg. Tried the Nustep but not successful at all. Dys 1 diet, thin liquids, upgraded to Dys 2. Scanning to the left better.   *See Care Plan and progress notes for long and short-term goals.   Revisions to Treatment Plan:  Assessing pain on left side for neuropathic and joint related.  Teaching Needs: Family education,medication management, pain management, skin/wound care, bowel/bladder management, transfer training, gait training, balance training, endurance training, safety awareness.  Current Barriers to Discharge: Decreased caregiver support, Medical stability, Home enviroment access/layout, Incontinence, Wound care, Lack of/limited family support, Medication compliance, Behavior, and Nutritional means  Possible Resolutions to Barriers: Continue current medications, provide emotional support.     Medical Summary Current Status: improved arousal, speech, swallowing. tolerating diet. Left sided pain may be neuropathic and joint related. ?  HO, AP still elevated. other LFT's down  Barriers to Discharge: Medical stability   Possible Resolutions to Barriers/Weekly Focus: pain control, maximize nutritional intake. assessment of pain generators on left side   Continued Need for Acute Rehabilitation Level  of Care: The patient requires daily medical management by a physician with specialized training in physical medicine and rehabilitation for the following reasons: Direction of a multidisciplinary physical rehabilitation program to maximize functional independence : Yes Medical management of patient stability for increased activity during participation in an intensive rehabilitation regime.: Yes Analysis of laboratory values and/or radiology reports with any subsequent need for medication adjustment and/or medical intervention. : Yes   I attest that I was present, lead the team conference, and concur with the assessment and plan of the team.   Tennis Must 06/22/2021, 4:14 PM

## 2021-06-22 NOTE — Progress Notes (Addendum)
PROGRESS NOTE   Subjective/Complaints: Up in bed. In good spirits. Still having pain in left arm and leg. Shoulder and ? Hip although pain has been variable.   ROS: Limited due to cognitive/behavioral    Objective:   No results found. Recent Labs    06/22/21 0634  WBC 5.1  HGB 13.4  HCT 40.8  PLT 251    Recent Labs    06/22/21 0634  NA 136  K 3.4*  CL 106  CO2 24  GLUCOSE 102*  BUN 7*  CREATININE 0.82  CALCIUM 9.3      Intake/Output Summary (Last 24 hours) at 06/22/2021 0951 Last data filed at 06/22/2021 0813 Gross per 24 hour  Intake 600 ml  Output --  Net 600 ml         Physical Exam: Vital Signs Blood pressure (!) 159/97, pulse 93, temperature 98 F (36.7 C), temperature source Oral, resp. rate 17, height 5\' 5"  (1.651 m), weight 61 kg, SpO2 100 %. Constitutional: No distress . Vital signs reviewed. HEENT: EOMI, oral membranes moist Neck: supple Cardiovascular: RRR without murmur. No JVD    Respiratory/Chest: CTA Bilaterally without wheezes or rales. Normal effort    GI/Abdomen: BS +, non-tender, non-distended Ext: no clubbing, cyanosis, or edema Psych: pleasant and cooperative Neurological:    Comments: left C7.  very alert.  follows basic commands. persistent STM deficits. Left inattention present. Dense left hemiparesis without change. RUE and RLE 4/5. Decreased LT Left side  ongoing. DTR's 3+ on left side. Early tone MAS 1+ to 2/4 LUE and tr LLE.  Musc: both left arm and leg tender with ROM today. More pain at left shoulder and hip it appears  Assessment/Plan: 1. Functional deficits which require 3+ hours per day of interdisciplinary therapy in a comprehensive inpatient rehab setting. Physiatrist is providing close team supervision and 24 hour management of active medical problems listed below. Physiatrist and rehab team continue to assess barriers to discharge/monitor patient progress  toward functional and medical goals  Care Tool:  Bathing    Body parts bathed by patient: Face   Body parts bathed by helper: Right arm, Left arm, Chest, Abdomen, Front perineal area, Buttocks, Right upper leg, Left upper leg, Right lower leg, Left lower leg     Bathing assist Assist Level: 2 Helpers     Upper Body Dressing/Undressing Upper body dressing   What is the patient wearing?: Pull over shirt    Upper body assist Assist Level: Total Assistance - Patient < 25%    Lower Body Dressing/Undressing Lower body dressing      What is the patient wearing?: Pants     Lower body assist Assist for lower body dressing: Maximal Assistance - Patient 25 - 49%     Toileting Toileting    Toileting assist Assist for toileting: 2 Helpers     Transfers Chair/bed transfer  Transfers assist     Chair/bed transfer assist level: Maximal Assistance - Patient 25 - 49%     Locomotion Ambulation   Ambulation assist   Ambulation activity did not occur: Safety/medical concerns  Assist level: 2 helpers Assistive device:  (3 musketeers) Max distance: 54'  Walk 10 feet activity   Assist  Walk 10 feet activity did not occur: Safety/medical concerns  Assist level: 2 helpers Assistive device:  (3 musketeers)   Walk 50 feet activity   Assist Walk 50 feet with 2 turns activity did not occur: Safety/medical concerns         Walk 150 feet activity   Assist Walk 150 feet activity did not occur: Safety/medical concerns         Walk 10 feet on uneven surface  activity   Assist Walk 10 feet on uneven surfaces activity did not occur: Safety/medical concerns         Wheelchair     Assist Will patient use wheelchair at discharge?: Yes Type of Wheelchair: Manual    Wheelchair assist level: Dependent - Patient 0% Max wheelchair distance: 150    Wheelchair 50 feet with 2 turns activity    Assist        Assist Level: Dependent - Patient 0%    Wheelchair 150 feet activity     Assist      Assist Level: Dependent - Patient 0%   Blood pressure (!) 159/97, pulse 93, temperature 98 F (36.7 C), temperature source Oral, resp. rate 17, height 5\' 5"  (1.651 m), weight 61 kg, SpO2 100 %.    Medical Problem List and Plan: 1.  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              -patient may shower             -ELOS/Goals: 7/22, min assist goals  -Continue CIR therapies including PT, OT, and SLP          2.  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 Wife prefers no oral anticoagulation given hematoma.  -DVT/anticoagulation:  Mechanical:  Antiembolism stockings, knee (TED hose) Bilateral lower extremities             -antiplatelet therapy: N/A due to concerns of CAA 3. Left sided limb pain/low back pain: increased tone, suspect neuropathic component - continue lidocaine patch -trial of gabapentin 100mg  tid -kpad for back -will check xrays of left shoulder and hip- will add aspercreme for left shoulder 4. Decreased arousal:    -decrease amantadine to qd. Continue ritalin 5mg  bid for arousal/initiaton             -antipsychotic agents: N/A 5. Neuropsych: This patient is not fully capable of making decisions on his own behalf. 6. Skin/Wound Care: Routine pressure relief measures. 7. Fluids/Electrolytes/Nutrition: monitor fluid intake while on PO alone  7/12 -I personally reviewed the patient's labs today.     -K+ sl low---replete   -albumin improving 8. Recurrent aspiration event:    -unasyn started 6/20---stopped 6/30             -- Wbc's 5k             --CXR recently stable, lungs clear 9. Leucocytosis: resolved 10. Blood pressure:  Vitals:   06/21/21 2001 06/22/21 0515  BP: 113/78 (!) 159/97  Pulse: 91 93  Resp: 17 17  Temp: 98.3 F (36.8 C) 98 F (36.7 C)  SpO2: 100% 100%   DBP elevation this morning--will monitor for now 11.  Progressive cognitive decline: To reschedule dementia work up at [facility] after d/c. 12. Hyperglycemia: Hgb A1C- 5.8.             --Monitor BS every  4 hours and use SSI  for elevated  BS   CBG (last 3)  Recent Labs    06/20/21 2053 06/21/21 1145 06/22/21 0557  GLUCAP 129* 107* 97  Controlled, checking daily only.  13. Dysphagia: . D2/thins diet initiated!.                -megace trial beneficial begin weaning    14. Tachycardia: 90's - monitor HR TID 15. Elevated liver transaminases: Liver u/s with one small cystic structure.   -all LFT's nearly back wnl  -AP slightly elevated, other LFT's better 16. Mild anemia-Ferritin elevated but Hgb, Fe++ and TIBC all low which point towards anemia of chronic disease.         LOS: 18 days A FACE TO FACE EVALUATION WAS PERFORMED  Ranelle Oyster 06/22/2021, 9:51 AM

## 2021-06-22 NOTE — Progress Notes (Signed)
Speech Language Pathology Daily Session Note  Patient Details  Name: Donald [name] MRN: [id number] Date of Birth: 11-Feb-1956  Today's Date: 06/22/2021 SLP Individual Time: 5427-0623 SLP Individual Time Calculation (min): 15 min  Short Term Goals: Week 3: SLP Short Term Goal 1 (Week 3): Patient will consume current diet with minimal overt s/s of aspiration and Min verbal cues for use of swallowing compensatory strategies. SLP Short Term Goal 2 (Week 3): Patient will locate/attend to left field of enviornment in 75% of opportunities with Mod A verbal cues. SLP Short Term Goal 3 (Week 3): Patient will orient to place, time and situation with Mod visual cues. SLP Short Term Goal 4 (Week 3): Patient will demonstrate sustained attention to functional tasks for 10 minutes with Mod verbal cues for recdirection. SLP Short Term Goal 5 (Week 3): Patient will demonstrate efficient mastication with complete oral clearance without overt s/s of aspiraiton with trials of Dys. 2 textures over 2 sessions with overall Min A verbal cues prior to  upgade.  Skilled Therapeutic Interventions: Skilled treatment session focused on dysphagia goals. SLP facilitated session by providing skilled observation with lunch meal of Dys. 2 textures. Patient demonstrated mildly prolonged mastication with mild oral stasis that cleared with a liquid wash. No overt s/s of aspiration noted. Recommend patient continue current diet. Patient's wife present and providing appropriate cueing, therefore, she is signed off to provide supervision with meals. Patient left upright in wheelchair with alarm on and wife present. Continue with current plan of care.      Pain No/Denies Pain   Therapy/Group: Individual Therapy  Donald [name] 06/22/2021, 1:00 PM

## 2021-06-22 NOTE — Progress Notes (Signed)
Nutrition Follow-up  DOCUMENTATION CODES:   Non-severe (moderate) malnutrition in context of chronic illness  INTERVENTION:  Provide 30 ml Prosource plus po BID, each supplement provides 100 kcal and 15 grams of protein.   Provide Boost Breeze po once daily, each supplement provides 250 kcal and 9 grams of protein  Encourage adequate PO intake.   NUTRITION DIAGNOSIS:   Moderate Malnutrition related to chronic illness as evidenced by mild fat depletion, moderate muscle depletion; ongoing  GOAL:   Patient will meet greater than or equal to 90% of their needs; progressing  MONITOR:   PO intake, Supplement acceptance, Diet advancement, Skin, Weight trends, Labs, I & O's  REASON FOR ASSESSMENT:   New TF    ASSESSMENT:   65 year old right-handed male with history of OA and and progressive dementia work-up who was originally admitted on 05/11/2021 with acute left hemiparesis with sensory deficits, right gaze preference and left facial droop due to 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 CT of head done 6/12 revealing interval progression of right frontal hematoma with worsening of mass-effect and midline shift. CT brain repeated showing truncated arterial cortical branch at upper and lateral hematoma. He continues to be limited by left sided weakness with right gaze preference and inability to cross midline, cognitive deficits with delay in processing and motor planning deficits, left hemiplegia with emerging tone affecting mobility and ADLs CIR recommended due to functional decline Cortrak NGT removed 7/7.   Diet has been advanced to a dysphagia 2 diet with thin liquids. Meal completion has been 70-100% recently. Family at bedside reports pt has been eating well at meals. RD to order nutritional supplements to aid in caloric and protein needs. Family to continue to encourage PO intake at meals.   Labs and medications reviewed.   Diet Order:   Diet Order              DIET DYS 2 Room service appropriate? Yes; Fluid consistency: Thin  Diet effective 0500 tomorrow                   EDUCATION NEEDS:   Not appropriate for education at this time  Skin:  Skin Assessment: Reviewed RN Assessment Skin Integrity Issues:: Other (Comment) Other: puncture R neck  Last BM:  7/11  Height:   Ht Readings from Last 1 Encounters:  06/04/21 5\' 5"  (1.651 m)    Weight:   Wt Readings from Last 1 Encounters:  06/22/21 61 kg   BMI:  Body mass index is 22.38 kg/m.  Estimated Nutritional Needs:   Kcal:  1850-2050  Protein:  90-110 grams  Fluid:  >/= 1.9 L/day  08/23/21, MS, RD, LDN RD pager number/after hours weekend pager number on Amion.

## 2021-06-23 ENCOUNTER — Inpatient Hospital Stay (HOSPITAL_COMMUNITY): Payer: No Typology Code available for payment source

## 2021-06-23 LAB — GLUCOSE, CAPILLARY
Glucose-Capillary: 103 mg/dL — ABNORMAL HIGH (ref 70–99)
Glucose-Capillary: 96 mg/dL (ref 70–99)
Glucose-Capillary: 108 mg/dL — ABNORMAL HIGH (ref 70–99)

## 2021-06-23 NOTE — Progress Notes (Signed)
Occupational Therapy Session Note  Patient Details  Name: Donald [name] MRN: [id number] Date of Birth: 1956/07/15  Today's Date: 06/23/2021 OT Individual Time: 0131-4388 OT Individual Time Calculation (min): 42 min    Short Term Goals: Week 2:  OT Short Term Goal 1 (Week 2): Pt will don shirt with max A in supported sitting. OT Short Term Goal 1 - Progress (Week 2): Met OT Short Term Goal 2 (Week 2): Pt will maintain midline orientation in supported sitting with overall max A. OT Short Term Goal 2 - Progress (Week 2): Met OT Short Term Goal 3 (Week 2): Pt will completed grooming task with mod A in supported sitting. OT Short Term Goal 3 - Progress (Week 2): Met Week 3:  OT Short Term Goal 1 (Week 3): Patient will complete toilet transfer with max A of 1 OT Short Term Goal 2 (Week 3): Pt will recall hemi dressing techniques with min questioning cues OT Short Term Goal 3 (Week 3): Patient will complete 1 step of UB dressing task   Skilled Therapeutic Interventions/Progress Updates:    Pt greeted at time of session sitting up in wheelchair with wife present. Pt agreeable to OT session and did have pain in L shoulder throughout session which has been ongoing and rest breaks and positional changes as needed. Repositioning in wheelchair prior to leaving room with Max A attempting to have pt forward weight shift and use RUE to position self. Pt transport > ortho gym and with 2+ assist using standing frame for approx 5-10 mins in standing gradually increasing hip extension for upright posture. Pt performing several UE tasks in standing including rolling weighted ball, reaching for objects, and following visual cues for improved posture. Focus of remaining session on LUE PROM and attempting to carryover self ROM. Transport back to room, TIS reclined, alarm on, call bell in reach and wife present.   Therapy Documentation Precautions:  Precautions Precautions: Fall Precaution Comments: L  hemiparesis, L inattention, pusher to the L, cognitive deficits, cortrak Restrictions Weight Bearing Restrictions: No    Therapy/Group: Individual Therapy  Viona Gilmore 06/23/2021, 12:56 PM

## 2021-06-23 NOTE — Progress Notes (Signed)
Occupational Therapy Session Note  Patient Details  Name: Donald [name] MRN: [id number] Date of Birth: 1956-12-02  Today's Date: 06/23/2021 OT Individual Time: 2330-0762 OT Individual Time Calculation (min): 42 min    Short Term Goals:  Week 3:  OT Short Term Goal 1 (Week 3): Patient will complete toilet transfer with max A of 1 OT Short Term Goal 2 (Week 3): Pt will recall hemi dressing techniques with min questioning cues OT Short Term Goal 3 (Week 3): Patient will complete 1 step of UB dressing task  Skilled Therapeutic Interventions/Progress Updates:    Pt received in room in w/c and consented to OT tx. Pt reported he needed to use the restroom, training in toilet transfers to [facility] positioned over toilet to increase independence with ADLs. Pt req mod A to power up and turn to sit on commode, however was able ot hold up his weight well through BLEs when standing. 2nd helper required to assist with clothing mgmt. While sitting on commode, pt req SBA for sitting balance and cuing to lean to R side as pt demo'd mild lean and push to L side. Pt had one small BM and NT notified. 2nd helper for posterior hygiene thoroughness, pt able to lean to L side to wipe initially, but safety was questionable. 2nd person also assisted with clothing mgmt after toileting. Pt taken down to gym for remaining time for PROM for LUE to increase ROM and decrease risk for contractures. Pt c/o pain but agreeable for elbow flexion movement, shoulder flexion, and wrist flexion and extension all for 2x10 very slow stretches. After tx, pt left up in w/c with seatbelt alarm on and awaiting lunch, all needs met.   Therapy Documentation Precautions:  Precautions Precautions: Fall Precaution Comments: L hemiparesis, L inattention, pusher to the L, cognitive deficits, cortrak Restrictions Weight Bearing Restrictions: No  Vital Signs: Therapy Vitals Temp: 98.6 F (37 C) Temp Source: Oral Pulse Rate: 79 Resp: 20 BP:  134/84 Patient Position (if appropriate): Lying Oxygen Therapy SpO2: 99 % O2 Device: Room Air Pain:  C/o pain in LUE and L hip during movement, no number given     Therapy/Group: Individual Therapy  Donald [name] 06/23/2021, 7:44 AM

## 2021-06-23 NOTE — Progress Notes (Signed)
PROGRESS NOTE   Subjective/Complaints: No new issues. Has complained of left leg pain still, shoulder also. Cream helping a bit?  ROS: Limited due to cognitive/behavioral    Objective:   DG Shoulder Left  Result Date: 06/22/2021 CLINICAL DATA:  Pain EXAM: LEFT SHOULDER - 2+ VIEW COMPARISON:  None. FINDINGS: Alignment is anatomic. No acute fracture. Joint spaces are preserved. No intrinsic osseous lesion. IMPRESSION: Negative. Electronically Signed   By: Guadlupe Spanish M.D.   On: 06/22/2021 20:28   DG HIP UNILAT W OR W/O PELVIS 1V LEFT  Result Date: 06/22/2021 CLINICAL DATA:  Hip pain EXAM: DG HIP (WITH OR WITHOUT PELVIS) 3V COMPARISON:  None. FINDINGS: Pelvic ring is intact. Contrast material is noted within the rectum. Degenerative changes of the hip joints are noted. Some mild cortical irregularity is noted in the intratrochanteric region which is likely degenerative in nature although the possibility of an occult fracture could not be totally excluded. CT or MRI would be helpful as clinically indicated. IMPRESSION: Questionable findings in the proximal left femur. Cross-sectional imaging would be helpful for further evaluation. Electronically Signed   By: Alcide Clever M.D.   On: 06/22/2021 20:29   Recent Labs    06/22/21 0634  WBC 5.1  HGB 13.4  HCT 40.8  PLT 251    Recent Labs    06/22/21 0634  NA 136  K 3.4*  CL 106  CO2 24  GLUCOSE 102*  BUN 7*  CREATININE 0.82  CALCIUM 9.3      Intake/Output Summary (Last 24 hours) at 06/23/2021 0818 Last data filed at 06/22/2021 1800 Gross per 24 hour  Intake 440 ml  Output --  Net 440 ml         Physical Exam: Vital Signs Blood pressure 134/84, pulse 79, temperature 98.6 F (37 C), temperature source Oral, resp. rate 20, height 5\' 5"  (1.651 m), weight 60.5 kg, SpO2 99 %. Constitutional: No distress . Vital signs reviewed. HEENT: EOMI, oral membranes moist Neck:  supple Cardiovascular: RRR without murmur. No JVD    Respiratory/Chest: CTA Bilaterally without wheezes or rales. Normal effort    GI/Abdomen: BS +, non-tender, non-distended Ext: no clubbing, cyanosis, or edema Psych: pleasant and cooperative  Neurological:    Comments: left C7.  very alert.  follows basic commands. STM deficits. Left inattention present. Dense left hemiparesis without change. RUE and RLE 4/5. Decreased LT Left side  ongoing. DTR's 3+ on left side. Early tone MAS 1+ to 2/4 LUE and tr LLE.  Musc: left shoulder tender with ROM, left leg tender too in hip?  Assessment/Plan: 1. Functional deficits which require 3+ hours per day of interdisciplinary therapy in a comprehensive inpatient rehab setting. Physiatrist is providing close team supervision and 24 hour management of active medical problems listed below. Physiatrist and rehab team continue to assess barriers to discharge/monitor patient progress toward functional and medical goals  Care Tool:  Bathing    Body parts bathed by patient: Face   Body parts bathed by helper: Right arm, Left arm, Chest, Abdomen, Front perineal area, Buttocks, Right upper leg, Left upper leg, Right lower leg, Left lower leg     Bathing  assist Assist Level: 2 Helpers     Upper Body Dressing/Undressing Upper body dressing   What is the patient wearing?: Pull over shirt    Upper body assist Assist Level: Total Assistance - Patient < 25%    Lower Body Dressing/Undressing Lower body dressing      What is the patient wearing?: Pants     Lower body assist Assist for lower body dressing: Maximal Assistance - Patient 25 - 49%     Toileting Toileting    Toileting assist Assist for toileting: 2 Helpers     Transfers Chair/bed transfer  Transfers assist     Chair/bed transfer assist level: Maximal Assistance - Patient 25 - 49%     Locomotion Ambulation   Ambulation assist   Ambulation activity did not occur:  Safety/medical concerns  Assist level: 2 helpers Assistive device:  (3 musketeers) Max distance: 25'   Walk 10 feet activity   Assist  Walk 10 feet activity did not occur: Safety/medical concerns  Assist level: 2 helpers Assistive device:  (3 musketeers)   Walk 50 feet activity   Assist Walk 50 feet with 2 turns activity did not occur: Safety/medical concerns         Walk 150 feet activity   Assist Walk 150 feet activity did not occur: Safety/medical concerns         Walk 10 feet on uneven surface  activity   Assist Walk 10 feet on uneven surfaces activity did not occur: Safety/medical concerns         Wheelchair     Assist Will patient use wheelchair at discharge?: Yes Type of Wheelchair: Manual    Wheelchair assist level: Dependent - Patient 0% Max wheelchair distance: 150    Wheelchair 50 feet with 2 turns activity    Assist        Assist Level: Dependent - Patient 0%   Wheelchair 150 feet activity     Assist      Assist Level: Dependent - Patient 0%   Blood pressure 134/84, pulse 79, temperature 98.6 F (37 C), temperature source Oral, resp. rate 20, height 5\' 5"  (1.651 m), weight 60.5 kg, SpO2 99 %.    Medical Problem List and Plan: 1.  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              -patient may shower             -ELOS/Goals: 7/22, min assist goals  -Continue CIR therapies including PT, OT, and SLP          2.  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 Wife prefers no oral anticoagulation given hematoma.  -DVT/anticoagulation:  Mechanical:  Antiembolism stockings, knee (TED hose) Bilateral lower extremities             -antiplatelet therapy: N/A due to concerns of CAA 3. Left sided limb pain/low back pain: increased tone, suspect neuropathic component - continue lidocaine patch -trial of gabapentin 100mg  tid -kpad for back -shoulder xr  unremarkable. ?IT area on left hip xr  -given pain, will check hip CT 4. Decreased arousal:    -decreased amantadine to qd. Continue ritalin 5mg  bid for arousal/initiaton             -antipsychotic agents: N/A 5. Neuropsych: This patient is not fully capable of making decisions on his own behalf. 6. Skin/Wound Care: Routine pressure relief measures. 7. Fluids/Electrolytes/Nutrition: monitor fluid intake while on PO  alone  7/12 -I personally reviewed the patient's labs today.     -K+ sl low---replete   -albumin improving 8. Recurrent aspiration event:    -unasyn started 6/20---stopped 6/30             -- Wbc's 5k             --CXR recently stable, lungs clear 9. Leucocytosis: resolved 10. Blood pressure:  Marland Kitchen Vitals:   06/22/21 2003 06/23/21 0511  BP: 130/86 134/84  Pulse: 92 79  Resp: 18 20  Temp: (!) 97.3 F (36.3 C) 98.6 F (37 C)  SpO2: 100% 99%  Reasonable control 11. Progressive cognitive decline: To reschedule dementia work up at [facility] after d/c. 12. Hyperglycemia: Hgb A1C- 5.8.             --Monitor BS every  4 hours and use SSI for elevated  BS   CBG (last 3)  Recent Labs    06/21/21 1145 06/22/21 0557 06/23/21 0617  GLUCAP 107* 97 103*  Controlled, checking daily only.  13. Dysphagia: . D2/thins diet initiated!.                -megace trial beneficial begin weaning    14. Tachycardia: 90's - monitor HR TID 15. Elevated liver transaminases: Liver u/s with one small cystic structure.   -all LFT's nearly back wnl  -AP slightly elevated, other LFT's better 16. Mild anemia-Ferritin elevated but Hgb, Fe++ and TIBC all low which point towards anemia of chronic disease.         LOS: 19 days A FACE TO FACE EVALUATION WAS PERFORMED  Ranelle Oyster 06/23/2021, 8:18 AM

## 2021-06-23 NOTE — Progress Notes (Signed)
Physical Therapy Session Note  Patient Details  Name: Donald [name] MRN: [id number] Date of Birth: 01-Apr-1956  Today's Date: 06/23/2021 PT Individual Time: 6789-3810 and 1751-0258 PT Individual Time Calculation (min): 32 min and 41 min  Short Term Goals: Week 3:  PT Short Term Goal 1 (Week 3): Pt will perform bed mobility with modA +1. PT Short Term Goal 2 (Week 3): Pt will perform sitting balance EOB with modA >5 minutes. PT Short Term Goal 3 (Week 3): Pt will perform gait training  x 72ft with max A +2 PT Short Term Goal 4 (Week 3): Pt will perform WC propulsion x38ft and mod assist using hemi technique  Skilled Therapeutic Interventions/Progress Updates:    Session 1: Pt received supine in bed, awake and agreeable to therapy session. Pt continues to complain of L hemibody pain LE>UE - noted with any increased passive L hip flexion ROM with pt reporting significantly increased pain - adjusted techniques and performed movements slowly for pain management. Supine>sitting L EOB, HOB flat but using bedrails, with max assist for B LE management and trunk upright. Sitting EOB, with +2 guarding trunk due to posterior lean requiring min/mod assist for balance at least 50% of the time during UB and LB dressing. Pt continues to demonstrate L inattention requiring max cuing to doff gown from L arm and attend to this task - requires max assist to don clean shirt with total cuing for L UE attention and sequencing of task - requires max/total assist to don pants with pt unable to lift L LE in open chain to assist with threading. Sit>stand EOB>3 Musketeer support with +2 max assist - demos posterior lean coming to stand with significantly excessive trunk/hip flexion causing crouched posturing in standing - pulled pants over hips total assist. R squat pivot EOB>w/c with max assist for lifting/pivoting hips due to pt continuing to demo significantly impaired motor planning and inability tolerate forward trunk  flexion to complete transfer. Pt left sitting tilted back in TIS w/c with needs in reach, seat belt alarm on, and  L UE in arm trough.   Session 2: Pt received sitting in TIS w/c with his wife present and pt agreeable to therapy session.  Transported to/from gym in w/c for time management and energy conservation. Sit>stand w/c>3 Musketeer support with +2 max assist for lifting to stand due to heavy posterior lean from lack of forward trunk flexion/anterior weight shift - pt continues to be limited in ability to perform anterior trunk lean/hip flexion due to onset of severe L LE pain with this position - pt continues to demo very delayed initiation and significantly impaired motor planning. Donned L LE DF assist ACE wrap. Gait training ~16ft, ~100ft via 3 Musketeer support - +2 max assist with 3rd person assist for w/c follow - pt demos sustained trunk/hip flexed and forward crouched posturing throughout gait despite cuing and max/total facilitation for improved upright trunk/hip extension - pt able to advance R LE while therapist blocked L knee (though no true "buckling") and facilitated L weight shift and hip extension for anterior pelvic translation over L stance limb - pt demos ability to initiate L swing advancement but requires total assist to complete - throughout pt also maintains very narrow BOS. Pt continues to have significant L hemibody pain even with sensitivity to slow, gentle movements - provided pt with ACE wrap sling for L UE during 2nd walk due to reports of increased shoulder pain with 3 Musketeer type positioning. R squat pivot  transfer w/c>EOM with max/total assist due to pt continuing to lack ability to flex trunk and weight shift anteriorly to clear hips during transfer (due to pain with that movement). Sitting EOM initially has L lean/LOB but once recovered maintained upright with close supervision. Sit>stand EOM>3 musketeer support +2 max assist as described above - in standing had pt  perform R UE reaching task to grasp 2 horseshoes from the top of the mirror targeting increased trunk/hip extension and anterior weight shift - requires +2 max assist for balance during this, blocking L knee, and facilitation for improved upright. L squat pivot EOM>TIS w/c with +2 max assist as described earlier with significant motor planning impairments and pain limitations. At end of session pt left sitting tilted back in TIS w/c with needs in reach, L UE supported on arm trough, and seat belt alarm on.   Therapy Documentation Precautions:  Precautions Precautions: Fall Precaution Comments: L hemiparesis, L inattention, pusher to the L, cognitive deficits, cortrak Restrictions Weight Bearing Restrictions: No   Pain: Session 1: Reports significant L LE pain when rolling onto that side with L LE in hip/knee flexed positioning - throughout session it appears pt has increased L LE pain during any activity/task passive or active that causes L hip flexion - tried to limit during session for pain management.  Session 2: Continues to have significant pain in L hemibody with gentle passive or active movements - especially pain in L LE during active or passive hip flexion - performed these movements slowly and within tolerable range during transfers.     Therapy/Group: Individual Therapy  Ginny Forth , PT, DPT, NCS, CSRS  06/23/2021, 12:47 PM

## 2021-06-23 NOTE — Progress Notes (Signed)
Speech Language Pathology Daily Session Note  Patient Details  Name: Donald [name] MRN: [id number] Date of Birth: 1956/07/08  Today's Date: 06/23/2021 SLP Individual Time: 0715-0755 SLP Individual Time Calculation (min): 40 min  Short Term Goals: Week 3: SLP Short Term Goal 1 (Week 3): Patient will consume current diet with minimal overt s/s of aspiration and Min verbal cues for use of swallowing compensatory strategies. SLP Short Term Goal 2 (Week 3): Patient will locate/attend to left field of enviornment in 75% of opportunities with Mod A verbal cues. SLP Short Term Goal 3 (Week 3): Patient will orient to place, time and situation with Mod visual cues. SLP Short Term Goal 4 (Week 3): Patient will demonstrate sustained attention to functional tasks for 10 minutes with Mod verbal cues for recdirection. SLP Short Term Goal 5 (Week 3): Patient will demonstrate efficient mastication with complete oral clearance without overt s/s of aspiraiton with trials of Dys. 2 textures over 2 sessions with overall Min A verbal cues prior to  upgade.  Skilled Therapeutic Interventions: Skilled treatment session focused on dysphagia goals. Upon arrival, patient was incontinent of urine. Patient followed directions for bed mobility with extra time and overall Min tactile cues. SLP facilitated session by providing skilled observation with breakfast meal of Dys. 2 textures with thin liquids. Patient demonstrated mildly prolonged mastication with mild oral stasis that cleared with a liquid wash. No overt s/s of aspiration noted. Recommend patient continue current diet. Patient continues to demonstrate language of confusion and was disoriented to place with Max verbal cues needed for utilization of a calendar for orientation to date. Mod verbal cues were needed for sustained attention to self-feeding but demonstrated increased attention to left visual field to locate items on tray and to make eye contact with clinician.  Patient left upright in bed with alarm on and all needs within reach. Continue with current plan of care.      Pain Pain Assessment Pain Scale: 0-10 Pain Score: 0-No pain Faces Pain Scale: No hurt  Therapy/Group: Individual Therapy  Montague Corella 06/23/2021, 12:52 PM

## 2021-06-24 DIAGNOSIS — M898X9 Other specified disorders of bone, unspecified site: Secondary | ICD-10-CM

## 2021-06-24 LAB — GLUCOSE, CAPILLARY: Glucose-Capillary: 85 mg/dL (ref 70–99)

## 2021-06-24 MED ORDER — PREDNISONE 20 MG PO TABS
20.0000 mg | ORAL_TABLET | Freq: Two times a day (BID) | ORAL | Status: DC
  Administered 2021-06-24 – 2021-06-28 (×8): 20 mg via ORAL
  Filled 2021-06-24 (×9): qty 1

## 2021-06-24 MED ORDER — TIZANIDINE HCL 2 MG PO TABS
2.0000 mg | ORAL_TABLET | Freq: Two times a day (BID) | ORAL | Status: DC
  Administered 2021-06-24 – 2021-06-29 (×10): 2 mg via ORAL
  Filled 2021-06-24 (×11): qty 1

## 2021-06-24 NOTE — Plan of Care (Signed)
  Problem: RH Cognition - SLP Goal: RH LTG Patient will demonstrate orientation with cues Description:  LTG:  Patient will demonstrate orientation to person/place/time/situation with cues (SLP)   Flowsheets (Taken 06/24/2021 2924) LTG: Patient will demonstrate orientation using cueing (SLP): Minimal Assistance - Patient > 75% Note: Downgraded due to slow progress

## 2021-06-24 NOTE — Progress Notes (Signed)
Occupational Therapy Session Note  Patient Details  Name: Donald [name] MRN: [id number] Date of Birth: [birth year]  Today's Date: 06/24/2021 OT Individual Time: 6381-7711 OT Individual Time Calculation (min): 60 min   Session 2 OT Individual Time: 6579-0383 OT Individual Time Calculation (min): 30 min    Short Term Goals: Week 3:  OT Short Term Goal 1 (Week 3): Patient will complete toilet transfer with max A of 1 OT Short Term Goal 2 (Week 3): Pt will recall hemi dressing techniques with min questioning cues OT Short Term Goal 3 (Week 3): Patient will complete 1 step of UB dressing task  Skilled Therapeutic Interventions/Progress Updates:  Session 1   Pt greeted semi-reclined in bed and agreeable to OT treatment session focused on self-care retraining. Worked on bed mobility with pt having diffiuclty initiating this am. Pt also with very tight tone in L LE and flexor tone throughout L UE. Pt needed max A to get to sitting EOB. Increased time and facilitation to get sitting balance 2/2 retropulsion and L hip pain making it difficult to anetrior weight shift. Sit<>stand with max A and max A to pivot to TIS wc. Bathing/dressing completed at the sink with focus on L attention, L visual scanning and sit<>stands, max A to get to standing, then +2 to assist with washing buttocks and clothing management. L UE massage and stretching to decrease tone. OT placed SAEBO e-stim on shoulder. SAEBO left on for 60 minutes. OT returned to remove SAEBO with skin intact and no adverse reactions.  [place] Stim One 330 pulse width 35 Hz pulse rate On 8 sec/ off 8 sec Ramp up/ down 2 sec Symmetrical Biphasic wave form  Max intensity 119mA at 500 Ohm load  Pt left seated in TIS wc with alarm belt on, call bell in reach and needs met.   Session 2 Pt greeted semi-reclined in bed initially declining therapy. Pt agreeable ROM of L UE. Pt with very tight flexor tone throughout UE. OT provided massage and gentle  ROM to achieve for shoulder, elbow, wrist/and extension, but unable to get full ROM. OT applied kinesiotape to L hand for sensory, bloodflow, and attention to L hand. Pt with some intermittent confusion this afternoon thinking his pastor was in the room. Pt left semi-reclined in bed with bed alarm on, call bell in reach, and needs met.   Therapy Documentation Precautions:  Precautions Precautions: Fall Precaution Comments: L hemiparesis, L inattention, pusher to the L, cognitive deficits, cortrak Restrictions Weight Bearing Restrictions: No Pain: Pain Assessment Pain with ROM Rest and repositioned for comfort   Therapy/Group: Individual Therapy  Valma Cava 06/24/2021, 2:50 PM

## 2021-06-24 NOTE — Progress Notes (Signed)
Pt has refused to take several medications stating " I'm taking to many mediations in my body", "let me think about it', "can we discuss it late". Nurse has attempted to educate pt on importance of adhering to medication regime while in rehab. Pt is agreeable to take medications when spouse if present.

## 2021-06-24 NOTE — Progress Notes (Signed)
Physical Therapy Session Note  Patient Details  Name: Donald [name] MRN: [id number] Date of Birth: 08/29/1956  Today's Date: 06/24/2021 PT Individual Time: 1010-1105 PT Individual Time Calculation (min): 55 min   Short Term Goals: Week 3:  PT Short Term Goal 1 (Week 3): Pt will perform bed mobility with modA +1. PT Short Term Goal 2 (Week 3): Pt will perform sitting balance EOB with modA >5 minutes. PT Short Term Goal 3 (Week 3): Pt will perform gait training  x 68ft with max A +2 PT Short Term Goal 4 (Week 3): Pt will perform WC propulsion x5ft and mod assist using hemi technique  Skilled Therapeutic Interventions/Progress Updates:     Pt received supine in bed and agrees to therapy. Pt cues to initiate bed mobility by utilizing R leg to bridge and allow abduction of L leg toward EOB. PT assists with L leg management, attempting to lift gently, and pt immediately beings to report significant pain in L leg. Supine to sit with maxA +1, very slowly to prevent pain provocation. Pt requires extra time sitting at EOB due to feeling pain in L leg and noted increase in tone. Stand pivot transfer to [facility] with maxA +1. WC transport to gym for time management. PT educates pt on plan to assist pt into prone positioning to provide passive stretch of L hip flexors to decrease tone and discomfort. Pt agreeable. maxA for stand pivot to mat. PT and tech provide maxA +2 for sit to supine, then dep +2 A for supine to prone transition. Pt demos sharp increase in flexor tone during turn and corresponding increase in pain symptoms. Much of rest of session spent atrempting to manage pain and educate pt on importance of mobility and movement. Pt demos significant confusion, stating that "running" is an exercise that does not provoke pain and that he is currently able to perform. Pt gradually improves tone in front body and reports improvement in pain as he is able to rest in prone position. Pt requires dep +2 A for return  to seated position. MaxA for stand pivot back to WC. Left seated in WC with alarm intact and all needs within reach.  Therapy Documentation Precautions:  Precautions Precautions: Fall Precaution Comments: L hemiparesis, L inattention, pusher to the L, cognitive deficits, cortrak Restrictions Weight Bearing Restrictions: No    Therapy/Group: Individual Therapy  Beau Fanny, PT, DPT 06/24/2021, 4:44 PM

## 2021-06-24 NOTE — Progress Notes (Addendum)
PROGRESS NOTE   Subjective/Complaints: Pt up in w/c eating breakfast with SLP. In good spirits. Left sided pain about the same  ROS: Limited due to cognitive/behavioral    Objective:   CT HIP LEFT WO CONTRAST  Result Date: 06/23/2021 CLINICAL DATA:  Hip pain, stress fracture suspected. Patient denies recent injury or surgery. EXAM: CT OF THE LEFT HIP WITHOUT CONTRAST TECHNIQUE: Multidetector CT imaging of the left hip was performed according to the standard protocol. Multiplanar CT image reconstructions were also generated. COMPARISON:  Left hip radiographs 06/22/2021. One view abdomen 06/04/2021. FINDINGS: Bones/Joint/Cartilage No evidence of acute fracture, dislocation or femoral head avascular necrosis. There are mild underlying left hip degenerative changes. Multifocal heterotopic soft tissue calcification is seen surrounding the hip, further described below. There is a probable underlying left hip joint effusion versus synovitis. Ligaments Suboptimally assessed by CT. Muscles and Tendons There is heterotopic calcification within the muscles surrounding the left hip, including the left iliacus muscle. Some of this calcification is likely within the joint capsule. This calcification appears diffuse and is likely related to remote trauma and/or chronic inflammation. No apparent soft tissue mass. No significant focal muscular atrophy. Soft tissues Mild subcutaneous edema lateral to the left hip without focal fluid collection. Diffuse periarticular soft tissue calcifications as described above. IMPRESSION: 1. No evidence of acute left hip fracture or dislocation. 2. Prominent left hip periarticular soft tissue calcifications, likely involving the muscles and joint capsule. This likely relates to remote injury and/or chronic inflammation. 3. Mild left hip degenerative changes. Electronically Signed   By: Richardean Sale M.D.   On: 06/23/2021 18:57    DG Shoulder Left  Result Date: 06/22/2021 CLINICAL DATA:  Pain EXAM: LEFT SHOULDER - 2+ VIEW COMPARISON:  None. FINDINGS: Alignment is anatomic. No acute fracture. Joint spaces are preserved. No intrinsic osseous lesion. IMPRESSION: Negative. Electronically Signed   By: Macy Mis M.D.   On: 06/22/2021 20:28   DG HIP UNILAT W OR W/O PELVIS 1V LEFT  Result Date: 06/22/2021 CLINICAL DATA:  Hip pain EXAM: DG HIP (WITH OR WITHOUT PELVIS) 3V COMPARISON:  None. FINDINGS: Pelvic ring is intact. Contrast material is noted within the rectum. Degenerative changes of the hip joints are noted. Some mild cortical irregularity is noted in the intratrochanteric region which is likely degenerative in nature although the possibility of an occult fracture could not be totally excluded. CT or MRI would be helpful as clinically indicated. IMPRESSION: Questionable findings in the proximal left femur. Cross-sectional imaging would be helpful for further evaluation. Electronically Signed   By: Inez Catalina M.D.   On: 06/22/2021 20:29   Recent Labs    06/22/21 0634  WBC 5.1  HGB 13.4  HCT 40.8  PLT 251    Recent Labs    06/22/21 0634  NA 136  K 3.4*  CL 106  CO2 24  GLUCOSE 102*  BUN 7*  CREATININE 0.82  CALCIUM 9.3      Intake/Output Summary (Last 24 hours) at 06/24/2021 0927 Last data filed at 06/23/2021 1815 Gross per 24 hour  Intake 400 ml  Output 150 ml  Net 250 ml  Physical Exam: Vital Signs Blood pressure 130/83, pulse 86, temperature 98.4 F (36.9 C), temperature source Oral, resp. rate 18, height $RemoveBe'5\' 5"'TWInaejvq$  (1.651 m), weight 60.6 kg, SpO2 100 %. Constitutional: No distress . Vital signs reviewed. HEENT: EOMI, oral membranes moist Neck: supple Cardiovascular: RRR without murmur. No JVD    Respiratory/Chest: CTA Bilaterally without wheezes or rales. Normal effort    GI/Abdomen: BS +, non-tender, non-distended Ext: no clubbing, cyanosis, or edema Psych: pleasant and  cooperative  Neurological:    Comments: left C7.  very alert.  follows basic commands. STM deficits. Left inattention present. Dense left hemiparesis without change. RUE and RLE 4/5. Decreased LT Left side  ongoing. DTR's 3+ on left side. Early tone MAS 1+ to 2/4 LUE and tr LLE.  Musc: left shoulder tender with ROM, left leg/hip tender with PROM as well  Assessment/Plan: 1. Functional deficits which require 3+ hours per day of interdisciplinary therapy in a comprehensive inpatient rehab setting. Physiatrist is providing close team supervision and 24 hour management of active medical problems listed below. Physiatrist and rehab team continue to assess barriers to discharge/monitor patient progress toward functional and medical goals  Care Tool:  Bathing    Body parts bathed by patient: Face   Body parts bathed by helper: Right arm, Left arm, Chest, Abdomen, Front perineal area, Buttocks, Right upper leg, Left upper leg, Right lower leg, Left lower leg     Bathing assist Assist Level: 2 Helpers     Upper Body Dressing/Undressing Upper body dressing   What is the patient wearing?: Pull over shirt    Upper body assist Assist Level: Total Assistance - Patient < 25%    Lower Body Dressing/Undressing Lower body dressing      What is the patient wearing?: Pants     Lower body assist Assist for lower body dressing: Maximal Assistance - Patient 25 - 49%     Toileting Toileting    Toileting assist Assist for toileting: 2 Helpers     Transfers Chair/bed transfer  Transfers assist     Chair/bed transfer assist level: Maximal Assistance - Patient 25 - 49% (squat pivot)     Locomotion Ambulation   Ambulation assist   Ambulation activity did not occur: Safety/medical concerns  Assist level: 2 helpers Assistive device: Other (comment) (3 Musketeer) Max distance: 53ft   Walk 10 feet activity   Assist  Walk 10 feet activity did not occur: Safety/medical  concerns  Assist level: 2 helpers Assistive device:  (3 musketeers)   Walk 50 feet activity   Assist Walk 50 feet with 2 turns activity did not occur: Safety/medical concerns         Walk 150 feet activity   Assist Walk 150 feet activity did not occur: Safety/medical concerns         Walk 10 feet on uneven surface  activity   Assist Walk 10 feet on uneven surfaces activity did not occur: Safety/medical concerns         Wheelchair     Assist Will patient use wheelchair at discharge?: Yes Type of Wheelchair: Manual    Wheelchair assist level: Dependent - Patient 0% Max wheelchair distance: 150    Wheelchair 50 feet with 2 turns activity    Assist        Assist Level: Dependent - Patient 0%   Wheelchair 150 feet activity     Assist      Assist Level: Dependent - Patient 0%   Blood pressure 130/83,  pulse 86, temperature 98.4 F (36.9 C), temperature source Oral, resp. rate 18, height $RemoveBe'5\' 5"'ntLiJNPHS$  (1.651 m), weight 60.6 kg, SpO2 100 %.    Medical Problem List and Plan: 1.  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              -patient may shower             -ELOS/Goals: 7/22, min assist goals  -Continue CIR therapies including PT, OT, and SLP          2.  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 Wife prefers no oral anticoagulation given hematoma.  -DVT/anticoagulation:  Mechanical:  Antiembolism stockings, knee (TED hose) Bilateral lower extremities             -antiplatelet therapy: N/A due to concerns of CAA 3. Left sided limb pain/low back pain: increased tone, suspect neuropathic component. Shoulder xr unremarkable. Hip xr ? IT fx--->hip CT - continue lidocaine patch -trial of gabapentin $RemoveBefor'100mg'toOFvbXxWnFq$  tid -kpad for back 7/14 -left hip CT demonstrates soft tissue calcification around joint. Suspect this could be early HO, certainly could cause pain. Alk Phos also elevated.  Early adhesive capsulitis left shoulder/hemiplegic shoulder  -begin tizanidine for tone, $RemoveB'2mg'GpjNheRH$  bid to start  -continue ROM, activities with therapy  -Will give burst of steroids for a few days as well  4. Arousal:  improved  -decreased amantadine to qd. Continue ritalin $RemoveBeforeDE'5mg'SmlFaHMJufLFroQ$  bid for arousal/initiaton             -antipsychotic agents: N/A 5. Neuropsych: This patient is not fully capable of making decisions on his own behalf. 6. Skin/Wound Care: Routine pressure relief measures. 7. Fluids/Electrolytes/Nutrition: monitor fluid intake while on PO alone  7/12 -I personally reviewed the patient's labs today.     -K+ sl low---repleting   -albumin improving 8. Recurrent aspiration event:    -unasyn started 6/20---stopped 6/30             -- Wbc's 5k             --CXR recently stable, lungs clear 9. Leucocytosis: resolved 10. Blood pressure:  Marland Kitchen Vitals:   06/23/21 2003 06/24/21 0448  BP: 125/85 130/83  Pulse: 98 86  Resp: 18 18  Temp: 98.8 F (37.1 C) 98.4 F (36.9 C)  SpO2: 99% 100%  Reasonable control 7/14 11. Progressive cognitive decline: To reschedule dementia work up at [facility] after d/c. 12. Hyperglycemia: Hgb A1C- 5.8.             --Monitor BS every  4 hours and use SSI for elevated  BS   CBG (last 3)  Recent Labs    06/23/21 1111 06/23/21 1618 06/24/21 0601  GLUCAP 96 108* 85  Controlled, checking daily only.  7/14 sugars likely to increase with steroids 13. Dysphagia: . D2/thins diet initiated!.              -megace trial VERY beneficial begin weaning. Has app for food again!    14. Tachycardia: 90's - monitor HR TID 15. Elevated liver transaminases: Liver u/s with one small cystic structure.   -all LFT's nearly back wnl  -AP slightly elevated, other LFT's better 16. Mild anemia-Ferritin elevated but Hgb, Fe++ and TIBC all low which point towards anemia of chronic disease.         LOS: 20 days A FACE TO FACE EVALUATION WAS PERFORMED  Meredith Staggers 06/24/2021,  9:27 AM

## 2021-06-24 NOTE — Progress Notes (Signed)
Speech Language Pathology Weekly Progress and Session Note  Patient Details  Name: Donald [name] MRN: [id number] Date of Birth: 08-28-1956  Beginning of progress report period: June 18, 2021 End of progress report period: June 24, 2021  Today's Date: 06/24/2021 SLP Individual Time: 0830-0910 SLP Individual Time Calculation (min): 40 min  Short Term Goals: Week 3: SLP Short Term Goal 1 (Week 3): Patient will consume current diet with minimal overt s/s of aspiration and Min verbal cues for use of swallowing compensatory strategies. SLP Short Term Goal 1 - Progress (Week 3): Met SLP Short Term Goal 2 (Week 3): Patient will locate/attend to left field of enviornment in 75% of opportunities with Mod A verbal cues. SLP Short Term Goal 2 - Progress (Week 3): Met SLP Short Term Goal 3 (Week 3): Patient will orient to place, time and situation with Mod visual cues. SLP Short Term Goal 3 - Progress (Week 3): Met SLP Short Term Goal 4 (Week 3): Patient will demonstrate sustained attention to functional tasks for 10 minutes with Mod verbal cues for recdirection. SLP Short Term Goal 4 - Progress (Week 3): Met SLP Short Term Goal 5 (Week 3): Patient will demonstrate efficient mastication with complete oral clearance without overt s/s of aspiraiton with trials of Dys. 2 textures over 2 sessions with overall Min A verbal cues prior to  upgade. SLP Short Term Goal 5 - Progress (Week 3): Met    New Short Term Goals: Week 4: SLP Short Term Goal 1 (Week 4): STGs=LTGs due to ELOS  Weekly Progress Updates: Patient continues to make excellent gains and has met 5 of 5 STGs this reporting period. Currently, patient is consuming dys. 2 textures with thin liquids with minimal overt s/s of aspiration and overall Min A verbal cues for use of swallowing compensatory strategies. Patient demonstrates improved overall speech intelligibility but requires Min-Mod A verbal cues at times for use of an increased vocal  intensity. Patient also demonstrates improved sustained attention to tasks as well as improved ability to scan to left field of environment, however, patient continues to require overall Mod-Max A verbal cues for orientation to place and time with intermittent language of confusion noted. Patient and family education ongoing. Patient would benefit from continued skilled SLP intervention to maximize his cognitive and swallowing function as well as his speech intelligibility prior to discharge.      Intensity: Minumum of 1-2 x/day, 30 to 90 minutes Frequency: 3 to 5 out of 7 days Duration/Length of Stay: 07/02/21 Treatment/Interventions: Cognitive remediation/compensation;Dysphagia/aspiration precaution training;Internal/external aids;Speech/Language facilitation;Environmental controls;Cueing hierarchy;Functional tasks;Patient/family education;Therapeutic Activities   Daily Session  Skilled Therapeutic Interventions:   Skilled treatment session focused on cognitive and dysphagia goals. SLP facilitated session by providing extra time and supervision verbal cues of use of visual aids for orientation to place and Mod verbal and visual cues for orientation to date. However, patient was independently oriented to situation. With min verbal cues, patient able to scan to left field of environment to locate items on tray and to make eye contact with clinician during conversation. Patient consumed breakfast meal of Dys. 2 textures with thin liquids without overt s/s of aspiration but required Mod verbal cues for sustained attention to self-feeding and Min verbal cues for use of swallowing compensatory strategies. Recommend patient continue current diet. Patient left upright in wheelchair with alarm on and all needs within reach. Continue with current plan of care.      Pain No/Denies Pain   Therapy/Group: Individual Therapy  Etienne Millward 06/24/2021, 6:34 AM

## 2021-06-25 LAB — GLUCOSE, CAPILLARY: Glucose-Capillary: 106 mg/dL — ABNORMAL HIGH (ref 70–99)

## 2021-06-25 MED ORDER — GABAPENTIN 100 MG PO CAPS
200.0000 mg | ORAL_CAPSULE | Freq: Three times a day (TID) | ORAL | Status: DC
  Administered 2021-06-25 – 2021-06-29 (×12): 200 mg via ORAL
  Filled 2021-06-25 (×12): qty 2

## 2021-06-25 MED ORDER — METHYLPHENIDATE HCL 5 MG PO TABS
10.0000 mg | ORAL_TABLET | Freq: Two times a day (BID) | ORAL | Status: DC
  Administered 2021-06-25 – 2021-07-09 (×26): 10 mg via ORAL
  Filled 2021-06-25 (×28): qty 2

## 2021-06-25 NOTE — Progress Notes (Signed)
Speech Language Pathology Daily Session Note  Patient Details  Name: Donald [name] MRN: [id number] Date of Birth: [birth year]  Today's Date: 06/25/2021 SLP Individual Time: 0700-0800 SLP Individual Time Calculation (min): 60 min  Short Term Goals: Week 4: SLP Short Term Goal 1 (Week 4): STGs=LTGs due to ELOS  Skilled Therapeutic Interventions: Skilled treatment session focused on cognitive and dysphagia goals. Upon arrival, patient had been incontinent of urine and was sheets and his shirt had been soaked through. Patient with minimal awareness of this. Throughout bed mobility and while donning a new shirt, patient required extra time and Mod A verbal and tactile cues to follow directions. Patient also appeared mildly resistive due to pain/discomfort with movement.  Patient consumed breakfast meal of Dys. 2 textures with thin liquids without overt s/s of aspiration but required Min verbal cues for use of swallowing compensatory strategies. Recommend patient continue current diet. SLP provided Mod A multimodal cues for orientation to place and time today. Patient with increased attention to meal/self-feeding today but required increased cueing for visual scanning to left field of environment. Patient left upright in bed with alarm on and all needs within reach. Continue with current plan of care.      Pain No/Denies Pain   Therapy/Group: Individual Therapy  Lizzie Cokley 06/25/2021, 8:47 AM

## 2021-06-25 NOTE — Progress Notes (Signed)
Occupational Therapy Session Note  Patient Details  Name: Donald [name] MRN: [id number] Date of Birth: [birth year]  Today's Date: 06/25/2021 OT Individual Time: 3354-5625 OT Individual Time Calculation (min): 73 min   Short Term Goals: Week 3:  OT Short Term Goal 1 (Week 3): Patient will complete toilet transfer with max A of 1 OT Short Term Goal 2 (Week 3): Pt will recall hemi dressing techniques with min questioning cues OT Short Term Goal 3 (Week 3): Patient will complete 1 step of UB dressing task  Skilled Therapeutic Interventions/Progress Updates:    Pt greeted semi-reclined in bed and agreeable to OT treatment session. Pt with tight hip and L UE this am and crying in pain when OT assisted pt to EOB. Pt with poor initiation to get to EOB requiring total A. Worked on sitting balance at EOB with max A 2/2 posterior lean, progressing to mod A with cues and facilitation. Pt gauriding L hip and submissive to pain. Max A squat-pivot over to TIS shower chair. Used chair cutout to doff brief. Bathing completed shower level with max A. Worked on initiating bathing tasks with mod multimodal cues. Hand over hand A to integrate L UE into bathing tasks with tight flexor tone. Max A to then transfer over to [place] wc after shower for dressing tasks. Worked on L visual scanning and attention to L side within UB and LB dressing. Sit<stands at the sink with mod/max A and +2 needed for clothing management. Gentle ROM and massage to L UE with decreased tone noted. OT placed SAEBO e-stim on shoulder for sublux and pain. SAEBO left on for 60 minutes. OT returned to remove SAEBO with skin intact and no adverse reactions.  [place] Stim One 330 pulse width 35 Hz pulse rate On 8 sec/ off 8 sec Ramp up/ down 2 sec Symmetrical Biphasic wave form  Max intensity 135mA at 500 Ohm load  Pt left seated in TIS wc with alarm belt on, call bell in reach, and needs met.   Therapy Documentation Precautions:   Precautions Precautions: Fall Precaution Comments: L hemiparesis, L inattention, pusher to the L, cognitive deficits, cortrak Restrictions Weight Bearing Restrictions: No  Pain: Patient with pain in L UE and L hip with transfers and movement. Rest and repositioned for comfort.   Therapy/Group: Individual Therapy  Valma Cava 06/25/2021, 11:20 AM

## 2021-06-25 NOTE — Progress Notes (Signed)
Physical Therapy Weekly Progress Note  Patient Details  Name: Donald [name] MRN: [id number] Date of Birth: [birth year]  Beginning of progress report period: June 18, 2021 End of progress report period: June 25, 2021  Today's Date: 06/25/2021 PT Individual Time: 1418-1530 PT Individual Time Calculation (min): 72 min   Patient has met 3 of 4 short term goals.  Pt is progressing slowly toward PT goals, limited by pain provocation in L leg and limited pain tolerance. Pt has improved independence with bed mobility and balance seated at EOB, able to sit >5 minutes at EOB with supervision. Pt has made little to no progress with L hemibody activation. Focus of coming week to be family education and caregiver training as pt DC set for 7/15.  Patient continues to demonstrate the following deficits muscle weakness, decreased cardiorespiratoy endurance, abnormal tone, unbalanced muscle activation, motor apraxia, decreased coordination, and decreased motor planning, decreased midline orientation and decreased attention to left, decreased initiation, decreased attention, decreased awareness, decreased problem solving, and decreased memory, and decreased sitting balance, decreased standing balance, decreased postural control, hemiplegia, and decreased balance strategies and therefore will continue to benefit from skilled PT intervention to increase functional independence with mobility.  Patient progressing toward long term goals..  Continue plan of care.  PT Short Term Goals Week 3:  PT Short Term Goal 1 (Week 3): Pt will perform bed mobility with modA +1. PT Short Term Goal 1 - Progress (Week 3): Met PT Short Term Goal 2 (Week 3): Pt will perform sitting balance EOB with modA >5 minutes. PT Short Term Goal 2 - Progress (Week 3): Met PT Short Term Goal 3 (Week 3): Pt will perform gait training  x 46ft with max A +2 PT Short Term Goal 3 - Progress (Week 3): Met PT Short Term Goal 4 (Week 3): Pt will perform  WC propulsion x67ft and mod assist using hemi technique PT Short Term Goal 4 - Progress (Week 3): Progressing toward goal Week 4:  PT Short Term Goal 1 (Week 4): STGs = LTGs  Skilled Therapeutic Interventions/Progress Updates:  Ambulation/gait training;Balance/vestibular training;Cognitive remediation/compensation;DME/adaptive equipment instruction;Discharge planning;Community reintegration;Disease management/prevention;Functional electrical stimulation;Functional mobility training;Neuromuscular re-education;Pain management;Patient/family education;Psychosocial support;Skin care/wound management;Splinting/orthotics;Stair training;Therapeutic Activities;Therapeutic Exercise;Wheelchair propulsion/positioning;UE/LE Coordination activities;UE/LE Strength taining/ROM;Visual/perceptual remediation/compensation   Pt received seated in recliner and agrees to therapy. No initial complaint of pain. WC transport to gym for time management. Pt performs squat pivot transfer from WC to mat with maxA, with PT cueing pt on head hips relationship. Pt resists forward flexion and requires additional cueing for body mechanics for safe transfer. PT also performs transfer very slowly to prevent provocation of L lower extremity pain. Pt performs sit to supine very slowly with modA, initially transitioning to R sideleaning on elbow and then rolling backward with assistance. In supine, PT provides very gentle PROM to L leg to guide it into extension, as pt typically presents with flexor tone and pain with movement. Pt able to tolerate slow transition to L leg extension. When L leg brought slowly back into flexion, however, pt reports significant pain and is unable to tolerate motion. Pt assisted back to seated position with maxA. While seated at edge of mat, pt requests water and utilizes R upper extremity to hold cup and bring to lips. PT provides verbal cues for posture and hip positioning but pt does not require physical  assistance, maintaining static sitting balance >5 minutes. Activity transitioned to having pt reach initially to the R to grab cup, then reaching  across body to the L to grab cup. When reaching to the L, pt consistently loses balance and requires totalA to prevent fall. Pt is unable to motor plan movement or make postural adjustment to complete safely. Squat pivot back to WC with maxA.   In room, PT provides update on pt's status to pt's wife and discusses needs for safe DC, including possible equipment and family education. Wife verbalizes understanding. Pt left seated with alarm intact and all needs within reach.  Therapy Documentation Precautions:  Precautions Precautions: Fall Precaution Comments: L hemiparesis, L inattention, pusher to the L, cognitive deficits, cortrak Restrictions Weight Bearing Restrictions: No  Therapy/Group: Individual Therapy  [name], PT, DPT 06/25/2021, 3:00 PM

## 2021-06-25 NOTE — Progress Notes (Signed)
PROGRESS NOTE   Subjective/Complaints: Pt listening to prayers, spiritual music. No new issues this morning.   ROS: Limited due to cognitive/behavioral    Objective:   CT HIP LEFT WO CONTRAST  Result Date: 06/23/2021 CLINICAL DATA:  Hip pain, stress fracture suspected. Patient denies recent injury or surgery. EXAM: CT OF THE LEFT HIP WITHOUT CONTRAST TECHNIQUE: Multidetector CT imaging of the left hip was performed according to the standard protocol. Multiplanar CT image reconstructions were also generated. COMPARISON:  Left hip radiographs 06/22/2021. One view abdomen 06/04/2021. FINDINGS: Bones/Joint/Cartilage No evidence of acute fracture, dislocation or femoral head avascular necrosis. There are mild underlying left hip degenerative changes. Multifocal heterotopic soft tissue calcification is seen surrounding the hip, further described below. There is a probable underlying left hip joint effusion versus synovitis. Ligaments Suboptimally assessed by CT. Muscles and Tendons There is heterotopic calcification within the muscles surrounding the left hip, including the left iliacus muscle. Some of this calcification is likely within the joint capsule. This calcification appears diffuse and is likely related to remote trauma and/or chronic inflammation. No apparent soft tissue mass. No significant focal muscular atrophy. Soft tissues Mild subcutaneous edema lateral to the left hip without focal fluid collection. Diffuse periarticular soft tissue calcifications as described above. IMPRESSION: 1. No evidence of acute left hip fracture or dislocation. 2. Prominent left hip periarticular soft tissue calcifications, likely involving the muscles and joint capsule. This likely relates to remote injury and/or chronic inflammation. 3. Mild left hip degenerative changes. Electronically Signed   By: Carey Bullocks M.D.   On: 06/23/2021 18:57   No results for  input(s): WBC, HGB, HCT, PLT in the last 72 hours.   No results for input(s): NA, K, CL, CO2, GLUCOSE, BUN, CREATININE, CALCIUM in the last 72 hours.     Intake/Output Summary (Last 24 hours) at 06/25/2021 1018 Last data filed at 06/25/2021 0830 Gross per 24 hour  Intake 600 ml  Output --  Net 600 ml         Physical Exam: Vital Signs Blood pressure 119/78, pulse 82, temperature 98.2 F (36.8 C), temperature source Oral, resp. rate 20, height 5\' 5"  (1.651 m), weight 60.6 kg, SpO2 100 %. Constitutional: No distress . Vital signs reviewed. HEENT: EOMI, oral membranes moist Neck: supple Cardiovascular: RRR without murmur. No JVD    Respiratory/Chest: CTA Bilaterally without wheezes or rales. Normal effort    GI/Abdomen: BS +, non-tender, non-distended Ext: no clubbing, cyanosis, or edema Psych: pleasant and cooperative  Neurological:    Comments: left C7. alert.  follows basic commands. STM deficits. Left inattention present. Dense left hemiparesis without change. RUE and RLE 4/5. Decreased LT Left side  ongoing. DTR's 3+ on left side. Early tone MAS 1+ to 2/4 LUE and tr LLE.  Musc: left shoulder/UE and Left hip/LE remain tender with PROM  Assessment/Plan: 1. Functional deficits which require 3+ hours per day of interdisciplinary therapy in a comprehensive inpatient rehab setting. Physiatrist is providing close team supervision and 24 hour management of active medical problems listed below. Physiatrist and rehab team continue to assess barriers to discharge/monitor patient progress toward functional and medical goals  Care Tool:  Bathing    Body parts bathed by patient: Face   Body parts bathed by helper: Right arm, Left arm, Chest, Abdomen, Front perineal area, Buttocks, Right upper leg, Left upper leg, Right lower leg, Left lower leg     Bathing assist Assist Level: 2 Helpers     Upper Body Dressing/Undressing Upper body dressing   What is the patient wearing?: Pull  over shirt    Upper body assist Assist Level: Total Assistance - Patient < 25%    Lower Body Dressing/Undressing Lower body dressing      What is the patient wearing?: Pants     Lower body assist Assist for lower body dressing: Maximal Assistance - Patient 25 - 49%     Toileting Toileting    Toileting assist Assist for toileting: 2 Helpers     Transfers Chair/bed transfer  Transfers assist     Chair/bed transfer assist level: Maximal Assistance - Patient 25 - 49% (squat pivot)     Locomotion Ambulation   Ambulation assist   Ambulation activity did not occur: Safety/medical concerns  Assist level: 2 helpers Assistive device: Other (comment) (3 Musketeer) Max distance: 54ft   Walk 10 feet activity   Assist  Walk 10 feet activity did not occur: Safety/medical concerns  Assist level: 2 helpers Assistive device:  (3 musketeers)   Walk 50 feet activity   Assist Walk 50 feet with 2 turns activity did not occur: Safety/medical concerns         Walk 150 feet activity   Assist Walk 150 feet activity did not occur: Safety/medical concerns         Walk 10 feet on uneven surface  activity   Assist Walk 10 feet on uneven surfaces activity did not occur: Safety/medical concerns         Wheelchair     Assist Will patient use wheelchair at discharge?: Yes Type of Wheelchair: Manual    Wheelchair assist level: Dependent - Patient 0% Max wheelchair distance: 150    Wheelchair 50 feet with 2 turns activity    Assist        Assist Level: Dependent - Patient 0%   Wheelchair 150 feet activity     Assist      Assist Level: Dependent - Patient 0%   Blood pressure 119/78, pulse 82, temperature 98.2 F (36.8 C), temperature source Oral, resp. rate 20, height 5\' 5"  (1.651 m), weight 60.6 kg, SpO2 100 %.    Medical Problem List and Plan: 1.  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              -patient may shower             -ELOS/Goals: 7/22, min assist goals  -Continue CIR therapies including PT, OT, and SLP          2.  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 Wife prefers no oral anticoagulation given hematoma.  -DVT/anticoagulation:  Mechanical:  Antiembolism stockings, knee (TED hose) Bilateral lower extremities             -antiplatelet therapy: N/A due to concerns of CAA 3. Left sided limb pain/low back pain: likely neuropathic pain and CT/exam/labs consistent with HO - continue lidocaine patch -increase gabapentin 200mg  tid arousal permitting -kpad for back -continue tizanidine for tone, 2mg  bid as tolerated -continue ROM, activities with therapy -Continue prednisone 20mg  bid for at least a few days, taper next week  4. Arousal:  improved  -dc amantadine, increase ritalin to 10mg  bid for arousal/initiaton             -antipsychotic agents: N/A 5. Neuropsych: This patient is not fully capable of making decisions on his own behalf. 6. Skin/Wound Care: Routine pressure relief measures. 7. Fluids/Electrolytes/Nutrition: monitor fluid intake while on PO alone  7/15     -K+ sl low---repleting   -albumin improving   -check labs monday 8. Recurrent aspiration event:    -unasyn started 6/20---stopped 6/30             -- Wbc's 5k             --CXR recently stable, lungs clear 9. Leucocytosis: resolved 10. Blood pressure:  7/30 Vitals:   06/24/21 2009 06/25/21 0428  BP: 106/74 119/78  Pulse: 89 82  Resp: 20 20  Temp: 98.3 F (36.8 C) 98.2 F (36.8 C)  SpO2: 100% 100%  Reasonable control 7/15 11. Progressive cognitive decline: To reschedule dementia work up at [facility] after d/c. 12. Hyperglycemia: Hgb A1C- 5.8.             --Monitor BS every  4 hours and use SSI for elevated  BS   CBG (last 3)  Recent Labs    06/23/21 1618 06/24/21 0601 06/25/21 0559  GLUCAP 108* 85 106*  Controlled, checking daily only.  7/15  sugars likely to increase with steroids 13. Dysphagia: . D2/thins diet initiated!.              -megace trial VERY beneficial begin weaning. Has app for food again!    14. Tachycardia: 80's-90's - monitor HR TID 15. Elevated liver transaminases: Liver u/s with one small cystic structure.   -all LFT's nearly back wnl  -AP slightly elevated likely related to HO 16. Mild anemia-Ferritin elevated but Hgb, Fe++ and TIBC all low which point towards anemia of chronic disease.         LOS: 21 days A FACE TO FACE EVALUATION WAS PERFORMED  8/15 06/25/2021, 10:18 AM

## 2021-06-26 LAB — GLUCOSE, CAPILLARY: Glucose-Capillary: 107 mg/dL — ABNORMAL HIGH (ref 70–99)

## 2021-06-26 NOTE — Progress Notes (Signed)
Speech Language Pathology Daily Session Note  Patient Details  Name: Ronn Smolinsky MRN: [id number] Date of Birth: [birth year]  Today's Date: 06/26/2021 SLP Individual Time: 7711-6579 SLP Individual Time Calculation (min): 45 min  Short Term Goals: Week 4: SLP Short Term Goal 1 (Week 4): STGs=LTGs due to ELOS  Skilled Therapeutic Interventions: Skilled SLP intervention focused on dysphagia and cognition. Pt seen with dys 2 breakfast tray and thin liquids.He attended to food items on left side of tray and made eye contact while conversing with SLP on left side of bed with Min A verbal cues. He required redirection during meal due to getting distracted by own thoughts and conversing with SLP. Pt tolerated dys 2 textures and thin liquids with no overt s/sx of aspiration or penetration. He requested salmon for lunch. Pt instructed to call kitchen and request salmon in dys 2 consistency for lunch at end of session.  Pt left seated upright in bed with bed alarm set and call button within reach. Cont with therapy per plan of care.     Pain Pain Assessment Pain Scale: Faces Faces Pain Scale: No hurt  Therapy/Group: Individual Therapy  Carlean Jews Lonney Revak 06/26/2021, 8:13 AM

## 2021-06-26 NOTE — Progress Notes (Addendum)
Occupational Therapy Session Note  Patient Details  Name: Donald [name] MRN: [id number] Date of Birth: [birth year]  Today's Date: 06/26/2021 OT Individual Time: 1115-1200 OT Individual Time Calculation (min): 45 min    Short Term Goals: Week 1:  OT Short Term Goal 1 (Week 1): Pt will maintain static sitting balance with max A of 1 in prep for seated ADL. OT Short Term Goal 1 - Progress (Week 1): Met OT Short Term Goal 2 (Week 1): Pt will completed grooming task with mod A in supported sitting. OT Short Term Goal 2 - Progress (Week 1): Progressing toward goal OT Short Term Goal 3 (Week 1): Pt will don shirt with max A in supported sitting. OT Short Term Goal 3 - Progress (Week 1): Progressing toward goal OT Short Term Goal 4 (Week 1): Pt will maintain midline orientation in supported sitting with overall mod A. OT Short Term Goal 4 - Progress (Week 1): Progressing toward goal  Skilled Therapeutic Interventions/Progress Updates:     Pt received in bed with unrated pain in L hip. Pt hypersensitive to touch as well as quick movmeent in L hemi body. Pt provided with ice at end of session for pain relief and wife present/verbalized understanding needing to remove after 15 min  ADL:  LB dressin at bed level with MIN A to roll L and MAX A to roll R for advancing pants past hips. Pt required increased time to process commands as well as attend to L visual field.  Therapeutic activity MAX A swuat pivot transfer to TIS with +2 providing guarding and increased time for pt to process hand placement and anterior weight shift.  STS at high low table 2x with MOD A+2 for facilitation of terminal hip extension and reaching high/R for clothes pins on line and placing into bucket L of centerling.Good weight shift achieved in mod ranges outside BOS to R to break up pushing tendencies.  Pt left at end of session in TIS with exit alarm on, call light in reach and all needs met   Therapy  Documentation Precautions:  Precautions Precautions: Fall Precaution Comments: L hemiparesis, L inattention, pusher to the L, cognitive deficits, cortrak Restrictions Weight Bearing Restrictions: No General:   Vital Signs: Therapy Vitals Temp: 98.5 F (36.9 C) Temp Source: Oral Pulse Rate: 89 Resp: 20 BP: 117/81 Patient Position (if appropriate): Lying Oxygen Therapy SpO2: 100 % O2 Device: Room Air Pain:   ADL: ADL Eating: NPO Grooming: Dependent Upper Body Bathing: Dependent Where Assessed-Upper Body Bathing: Bed level Lower Body Bathing: Dependent Where Assessed-Lower Body Bathing: Bed level Upper Body Dressing: Dependent Where Assessed-Upper Body Dressing: Bed level Lower Body Dressing: Dependent Where Assessed-Lower Body Dressing: Bed level Toileting: Dependent Where Assessed-Toileting: Bed level Toilet Transfer: Not assessed Tub/Shower Transfer: Not assessed Gaffer Transfer: Not assessed Vision   Perception    Praxis   Exercises:   Other Treatments:     Therapy/Group: Individual Therapy  Tonny Branch 06/26/2021, 6:40 AM

## 2021-06-27 LAB — GLUCOSE, CAPILLARY: Glucose-Capillary: 113 mg/dL — ABNORMAL HIGH (ref 70–99)

## 2021-06-27 MED ORDER — SORBITOL 70 % SOLN
30.0000 mL | Freq: Once | Status: AC
  Administered 2021-06-27: 30 mL via ORAL
  Filled 2021-06-27: qty 30

## 2021-06-27 NOTE — Progress Notes (Signed)
Speech Language Pathology Daily Session Note  Patient Details  Name: Donald [name] MRN: [id number] Date of Birth: 1956/11/24  Today's Date: 06/27/2021 SLP Individual Time: 1500-1530 SLP Individual Time Calculation (min): 30 min  Short Term Goals: Week 4: SLP Short Term Goal 1 (Week 4): STGs=LTGs due to ELOS  Skilled Therapeutic Interventions:  Pt was seen for skilled ST targeting cognitive goals.  Pt was on the phone upon therapist's arrival but appropriately ended call to participate in treatment.  SLP facilitated the session with a basic card game to address visual scanning and sustained attention.  Pt sustained his attention to task for ~1-3 minute intervals before requiring cues for redirection.  Pt needed up to max assist multimodal cues to locate cards to the left of midline.  Pt also benefited from mod-max assist multimodal cues to slow rate of play due to impulsivity.  Pt was left in bed with bed alarm set and call bell within reach.  Continue per current plan of care.    Pain Pain Assessment Pain Scale: 0-10 Pain Score: 0-No pain  Therapy/Group: Individual Therapy  Donald [name], Melanee Spry 06/27/2021, 4:11 PM

## 2021-06-27 NOTE — Progress Notes (Addendum)
PROGRESS NOTE   Subjective/Complaints:   Pt reports no pain- doesn't remember when last had BM.  But yesterday said was 3 days ago? Denies constipation.    LBM actually 7/14- will give Sorbitol to get pt to have BM.    ROS: limited due to cognition   Objective:   No results found. No results for input(s): WBC, HGB, HCT, PLT in the last 72 hours.   No results for input(s): NA, K, CL, CO2, GLUCOSE, BUN, CREATININE, CALCIUM in the last 72 hours.     Intake/Output Summary (Last 24 hours) at 06/27/2021 1118 Last data filed at 06/27/2021 0803 Gross per 24 hour  Intake 400 ml  Output 550 ml  Net -150 ml         Physical Exam: Vital Signs Blood pressure 119/77, pulse 91, temperature 98.3 F (36.8 C), temperature source Oral, resp. rate 20, height 5\' 5"  (1.651 m), weight 61.4 kg, SpO2 100 %.    General: awake, alert, appropriate, listening to prayers/music; laying in bed; NAD HENT: dysconjugate gaze- L eye more medially gazing- resolved with EOMs to other directions- due to CN 7 palsy. ; oropharynx moist CV: regular rate; no JVD Pulmonary: CTA B/L; no W/R/R- good air movement GI: soft, NT, ND, (+)BS- hypoactive; Not distended Psychiatric: appropriate; interactive  Neurological: alert- doesn't remember LBM.     Comments: left C7. alert.  follows basic commands. STM deficits. Left inattention present. Dense left hemiparesis without change. RUE and RLE 4/5. Decreased LT Left side  ongoing. DTR's 3+ on left side. Early tone MAS 1+ to 2/4 LUE and tr LLE. - wearing L resting wrist splint Musc: left shoulder/UE and Left hip/LE remain tender with PROM  Assessment/Plan: 1. Functional deficits which require 3+ hours per day of interdisciplinary therapy in a comprehensive inpatient rehab setting. Physiatrist is providing close team supervision and 24 hour management of active medical problems listed below. Physiatrist and  rehab team continue to assess barriers to discharge/monitor patient progress toward functional and medical goals  Care Tool:  Bathing    Body parts bathed by patient: Face   Body parts bathed by helper: Right arm, Left arm, Chest, Abdomen, Front perineal area, Buttocks, Right upper leg, Left upper leg, Right lower leg, Left lower leg     Bathing assist Assist Level: 2 Helpers     Upper Body Dressing/Undressing Upper body dressing   What is the patient wearing?: Pull over shirt    Upper body assist Assist Level: Total Assistance - Patient < 25%    Lower Body Dressing/Undressing Lower body dressing      What is the patient wearing?: Pants     Lower body assist Assist for lower body dressing: Maximal Assistance - Patient 25 - 49%     Toileting Toileting    Toileting assist Assist for toileting: 2 Helpers     Transfers Chair/bed transfer  Transfers assist     Chair/bed transfer assist level: Maximal Assistance - Patient 25 - 49% (squat pivot)     Locomotion Ambulation   Ambulation assist   Ambulation activity did not occur: Safety/medical concerns  Assist level: 2 helpers Assistive device: Other (comment) (3 Musketeer)  Max distance: 42ft   Walk 10 feet activity   Assist  Walk 10 feet activity did not occur: Safety/medical concerns  Assist level: 2 helpers Assistive device:  (3 musketeers)   Walk 50 feet activity   Assist Walk 50 feet with 2 turns activity did not occur: Safety/medical concerns         Walk 150 feet activity   Assist Walk 150 feet activity did not occur: Safety/medical concerns         Walk 10 feet on uneven surface  activity   Assist Walk 10 feet on uneven surfaces activity did not occur: Safety/medical concerns         Wheelchair     Assist Will patient use wheelchair at discharge?: Yes Type of Wheelchair: Manual    Wheelchair assist level: Dependent - Patient 0% Max wheelchair distance: 150     Wheelchair 50 feet with 2 turns activity    Assist        Assist Level: Dependent - Patient 0%   Wheelchair 150 feet activity     Assist      Assist Level: Dependent - Patient 0%   Blood pressure 119/77, pulse 91, temperature 98.3 F (36.8 C), temperature source Oral, resp. rate 20, height 5\' 5"  (1.651 m), weight 61.4 kg, SpO2 100 %.    Medical Problem List and Plan: 1.  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              -patient may shower             -ELOS/Goals: 7/22, min assist goals  -Continue CIR- PT, OT and SLP 2.  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 Wife prefers no oral anticoagulation given hematoma.  -DVT/anticoagulation:  Mechanical:  Antiembolism stockings, knee (TED hose) Bilateral lower extremities             -antiplatelet therapy: N/A due to concerns of CAA 3. Left sided limb pain/low back pain: likely neuropathic pain and CT/exam/labs consistent with HO - continue lidocaine patch -increase gabapentin 200mg  tid arousal permitting -kpad for back -continue tizanidine for tone, 2mg  bid as tolerated -continue ROM, activities with therapy -Continue prednisone 20mg  bid for at least a few days, taper next week -7/17- pain resolved per pt- con't current regimen 4. Arousal:  improved  -dc amantadine, increase ritalin to 10mg  bid for arousal/initiaton  7/17- calm, but more awake- con't regimen             -antipsychotic agents: N/A 5. Neuropsych: This patient is not fully capable of making decisions on his own behalf. 6. Skin/Wound Care: Routine pressure relief measures. 7. Fluids/Electrolytes/Nutrition: monitor fluid intake while on PO alone  7/15     -K+ sl low---repleting   -albumin improving   -check labs monday 8. Recurrent aspiration event:    -unasyn started 6/20---stopped 6/30             -- Wbc's 5k             --CXR recently stable, lungs clear 9.  Leucocytosis: resolved 10. Blood pressure:  [birth year]1981 Vitals:   06/26/21 1942 06/27/21 0425  BP: 120/78 119/77  Pulse: 98 91  Resp: 20 20  Temp: 97.6 F (36.4 C) 98.3 F (36.8 C)  SpO2: 100% 100%  Reasonable control 7/15 11. Progressive cognitive decline: To reschedule dementia work up at [facility] after d/c. 12. Hyperglycemia: Hgb A1C- 5.8.             --  Monitor BS every  4 hours and use SSI for elevated  BS   CBG (last 3)  Recent Labs    06/25/21 0559 06/26/21 0636 06/27/21 0605  GLUCAP 106* 107* 113*  Controlled, checking daily only.  7/17- BG's look great con't regimen 13. Dysphagia: . D2/thins diet initiated!.              -megace trial VERY beneficial begin weaning. Has app for food again!    14. Tachycardia: 80's-90's - monitor HR TID 15. Elevated liver transaminases: Liver u/s with one small cystic structure.   -all LFT's nearly back wnl  -AP slightly elevated likely related to HO 16. Mild anemia-Ferritin elevated but Hgb, Fe++ and TIBC all low which point towards anemia of chronic disease.   17. Constipation  7/17- LBM 3+ days ago per chart- was medium- will give sorbitol to get pt to have BM.      LOS: 23 days A FACE TO FACE EVALUATION WAS PERFORMED  Donald [name] 06/27/2021, 11:18 AM

## 2021-06-28 LAB — CBC
HCT: 44.5 % (ref 39.0–52.0)
Hemoglobin: 14.7 g/dL (ref 13.0–17.0)
MCH: 30.7 pg (ref 26.0–34.0)
MCHC: 33 g/dL (ref 30.0–36.0)
MCV: 92.9 fL (ref 80.0–100.0)
Platelets: 287 10*3/uL (ref 150–400)
RBC: 4.79 MIL/uL (ref 4.22–5.81)
RDW: 14.2 % (ref 11.5–15.5)
WBC: 8 10*3/uL (ref 4.0–10.5)
nRBC: 0 % (ref 0.0–0.2)

## 2021-06-28 LAB — BASIC METABOLIC PANEL
Anion gap: 11 (ref 5–15)
BUN: 14 mg/dL (ref 8–23)
CO2: 23 mmol/L (ref 22–32)
Calcium: 10.2 mg/dL (ref 8.9–10.3)
Chloride: 104 mmol/L (ref 98–111)
Creatinine, Ser: 0.75 mg/dL (ref 0.61–1.24)
GFR, Estimated: >60 mL/min (ref >60)
Glucose, Bld: 99 mg/dL (ref 70–99)
Potassium: 3.9 mmol/L (ref 3.5–5.1)
Sodium: 138 mmol/L (ref 135–145)

## 2021-06-28 LAB — GLUCOSE, CAPILLARY: Glucose-Capillary: 113 mg/dL — ABNORMAL HIGH (ref 70–99)

## 2021-06-28 MED ORDER — PREDNISONE 20 MG PO TABS
20.0000 mg | ORAL_TABLET | Freq: Every day | ORAL | Status: DC
  Administered 2021-06-29 – 2021-07-01 (×3): 20 mg via ORAL
  Filled 2021-06-28 (×3): qty 1

## 2021-06-28 MED ORDER — SORBITOL 70 % SOLN
60.0000 mL | Status: AC
  Administered 2021-06-28: 60 mL via ORAL
  Filled 2021-06-28: qty 60

## 2021-06-28 NOTE — NC FL2 (Signed)
[place] MEDICAID FL2 LEVEL OF CARE SCREENING TOOL     IDENTIFICATION  Patient Name: Donald [name] Birthdate: 1956-01-24 Sex: male Admission Date (Current Location): 06/04/2021  [facility] [place] and [place] Number:  Producer, television/film/video and Address:  [facility], [address], [place], [place] [address]      Provider Number: [id number]  Attending Physician Name and Address:  Ranelle Oyster, MD  Relative Name and Phone Number:  Victormanuel Mclure #[phone]    Current Level of Care: Hospital Recommended Level of Care: Skilled Nursing Facility Prior Approval Number:    Date Approved/Denied:   PASRR Number: [id number]  Discharge Plan: SNF    Current Diagnoses: Patient Active Problem List   Diagnosis Date Noted   Transaminitis 06/06/2021   Intraparenchymal hematoma of brain (HCC) 06/04/2021   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HCC) 05/30/2021   Right lower lobe pneumonia 05/30/2021   Prediabetes 05/30/2021   Hyperglycemia 05/30/2021   Intracerebral hemorrhage 05/24/2021   Seizure (HCC) 05/23/2021   Malnutrition of moderate degree 05/22/2021   Memory deficits    OSA (obstructive sleep apnea)    Generalized OA    Hypokalemia    Leukocytosis    Annual physical exam 04/03/2017    Orientation RESPIRATION BLADDER Height & Weight     Time, Place, Situation, Self  Normal Incontinent Weight: 134 lb 4.2 oz (60.9 kg) Height:  5\' 5"  (165.1 cm)  BEHAVIORAL SYMPTOMS/MOOD NEUROLOGICAL BOWEL NUTRITION STATUS    Convulsions/Seizures Incontinent Diet (D2 thin)  AMBULATORY STATUS COMMUNICATION OF NEEDS Skin   Extensive Assist Verbally Normal                       Personal Care Assistance Level of Assistance  Bathing, Feeding, Dressing Bathing Assistance: Limited assistance Feeding assistance: Limited assistance Dressing Assistance: Limited assistance     Functional Limitations Info             SPECIAL CARE FACTORS FREQUENCY  PT (By licensed PT),  OT (By licensed OT), Speech therapy     PT Frequency: 5xs per week OT Frequency: 5xs per week     Speech Therapy Frequency: 5xs per week      Contractures Contractures Info: Not present    Additional Factors Info  Code Status, Allergies Code Status Info: Full Allergies Info: Sulfa antibiotics           Current Medications (06/28/2021):  This is the current hospital active medication list Current Facility-Administered Medications  Medication Dose Route Frequency Provider Last Rate Last Admin   (feeding supplement) PROSource Plus liquid 30 mL  30 mL Oral BID BM 06/30/2021, MD   30 mL at 06/28/21 1406   0.9 %  sodium chloride infusion   Intravenous PRN 06/30/21, MD   Stopped at 06/09/21 0330   acetaminophen (TYLENOL) tablet 650 mg  650 mg Oral BID PRN 06/11/21, MD   650 mg at 06/27/21 0859   alum & mag hydroxide-simeth (MAALOX/MYLANTA) 200-200-20 MG/5ML suspension 30 mL  30 mL Oral Q4H PRN Love, Pamela S, PA-C       bisacodyl (DULCOLAX) suppository 10 mg  10 mg Rectal Daily PRN Love, Pamela S, PA-C       chlorhexidine (PERIDEX) 0.12 % solution 15 mL  15 mL Mouth Rinse BID Love, Pamela S, PA-C   15 mL at 06/28/21 0818   diphenhydrAMINE (BENADRYL) 12.5 MG/5ML elixir 12.5-25 mg  12.5-25 mg Oral Q6H PRN Love,  Evlyn Kanner, PA-C       feeding supplement (BOOST / RESOURCE BREEZE) liquid 1 Container  1 Container Oral Q1500 Ranelle Oyster, MD   1 Container at 06/27/21 1434   gabapentin (NEURONTIN) capsule 200 mg  200 mg Oral TID Ranelle Oyster, MD   200 mg at 06/28/21 1401   guaiFENesin-dextromethorphan (ROBITUSSIN DM) 100-10 MG/5ML syrup 5-10 mL  5-10 mL Oral Q6H PRN Jacquelynn Cree, PA-C   10 mL at 06/09/21 0016   lidocaine (LIDODERM) 5 % 1 patch  1 patch Transdermal Q24H Horton Chin, MD   1 patch at 06/28/21 1402   MEDLINE mouth rinse  15 mL Mouth Rinse q12n4p Jacquelynn Cree, PA-C   15 mL at 06/28/21 1405   megestrol (MEGACE) 400 MG/10ML suspension 400 mg   400 mg Oral Daily Ranelle Oyster, MD   400 mg at 06/28/21 0818   methylphenidate (RITALIN) tablet 10 mg  10 mg Oral BID WC Ranelle Oyster, MD   10 mg at 06/28/21 1401   Muscle Rub CREA   Topical BID Ranelle Oyster, MD   Given at 06/28/21 0818   polyethylene glycol (MIRALAX / GLYCOLAX) packet 17 g  17 g Oral Daily PRN Jacquelynn Cree, PA-C   17 g at 06/28/21 2500   [START ON 06/29/2021] predniSONE (DELTASONE) tablet 20 mg  20 mg Oral Q breakfast Ranelle Oyster, MD       prochlorperazine (COMPAZINE) tablet 5-10 mg  5-10 mg Oral Q6H PRN Love, Pamela S, PA-C       Or   prochlorperazine (COMPAZINE) injection 5-10 mg  5-10 mg Intramuscular Q6H PRN Love, Pamela S, PA-C       Or   prochlorperazine (COMPAZINE) suppository 12.5 mg  12.5 mg Rectal Q6H PRN Love, Pamela S, PA-C       sodium phosphate (FLEET) 7-19 GM/118ML enema 1 enema  1 enema Rectal Once PRN Love, Pamela S, PA-C       tiZANidine (ZANAFLEX) tablet 2 mg  2 mg Oral BID Ranelle Oyster, MD   2 mg at 06/28/21 0818   traZODone (DESYREL) tablet 25-50 mg  25-50 mg Oral QHS PRN Jacquelynn Cree, PA-C   50 mg at 06/19/21 2019     Discharge Medications: Please see discharge summary for a list of discharge medications.  Relevant Imaging Results:  Relevant Lab Results:   Additional Information SS# [id number]  Gretchen Short, LCSW

## 2021-06-28 NOTE — Progress Notes (Signed)
Physical Therapy Session Note  Patient Details  Name: Donald [name] MRN: [id number] Date of Birth: 07/29/1956  Today's Date: 06/28/2021 PT Individual Time: 1420-1535 PT Individual Time Calculation (min): 75 min   Short Term Goals: Week 4:  PT Short Term Goal 1 (Week 4): STGs = LTGs  Skilled Therapeutic Interventions/Progress Updates:     Patient in TIS w/c with his wife, son, and daughter present for family education upon PT arrival. Patient alert and agreeable to PT session. Patient with intermittent pain with grimacing and crying out with movement of L hemibody throughout session. RN made aware. PT provided repositioning, rest breaks, and distraction as pain interventions throughout session. Educated patient's family on care and mobility of L side to reduce pain and educated on hypersensitivity and central pain response following stroke.   Therapeutic Activity: Bed Mobility: Patient performed rolling L with min A and R with max-total A for removal of Hoyer sling with total A. Educated on easier rolling to the L with use of R side to push and reach and tucking technique to minimize rolling and time on R side due to increased assist. He performed supine to/from sit with max A. Provided verbal cues for safe body mechanics and support of L hemi-body for pain managment. Patient sat EOB with mod A initially with strong L lean, cued patient to lean on his R elbow to promote R lean and patient able to return to midline after with CGA for sitting balance. Patient reported increased fatigue and desire to return to lying, as above. Transfers: Patient requested to perform toileting for a BM at beginning of session. He performed sit to/from stand pulling up at the sink x2 with mod A +2 with patient's son and PT providing assist. Replaced w/c with BSC behind patient and performing peri-care and lower body clothing management with total A. Patient was unsuccessful with BM or voiding with urinal placement with  total A during toileting. Discussed bathroom set-up, patient's wife reports that there is very limited distance behind the sink and that this strategy for toileting would be unable to be performed int he bathroom. She also reports that she would be alone most of the day and would be unable to perform this independently. Educated on technique for bed level toileting or hoyer lift with toilet sling to BSC. Demonstrated and provided hands on training for dependent Hoyer lift transfer with sling placement and removal TIS w/c>bed. Demonstrated 1 person assist and had patient's wife perform manual hydrolic pump and maneuvering the Hoyer lift with the patient to demonstrate physical demand of 1 person lift assist. Patient's wife reports that there is not enough room in the bedroom that the patient will be staying in for the lift, a hospital bed, or a TIS w/c in the home. She also reports that she would need a second person to assist her with the [facility] lift due to increased physical demand and patient safety.   Patient's wife expressed concern about being able to safely assist the patient when she is alone during the day at his current level of function. Educated on fluctuations in patient's progress due to deficits from stroke, both cognitive and physical, and discussed alternative d/c options. She asked about transfer to a intensive rehab facility, educated on CIR being an intensive rehab facility more appropriate options of d/c home with [facility] therapies and hiring a aid to provide second person assist versus SNF placement to allow time for patient to progress and family to prepare for d/c  home. Patient's wife requested more information and consented to initiation of a search for SNF placement. CSW made aware of discussion following session.   Patient in bed with his family at bedside at end of session with breaks locked, bed alarm set, and all needs within reach.   Therapy Documentation Precautions:   Precautions Precautions: Fall Precaution Comments: L hemiparesis, L inattention, pusher to the L, cognitive deficits, cortrak Restrictions Weight Bearing Restrictions: No    Therapy/Group: Individual Therapy  Samanvitha Germany L Ezana Hubbert PT, DPT  06/28/2021, 9:22 PM

## 2021-06-28 NOTE — Progress Notes (Signed)
PROGRESS NOTE   Subjective/Complaints:   No new issues this morning. Seems to be in good spirits. Still no bm  ROS: Patient denies fever, rash, sore throat, blurred vision, nausea, vomiting, diarrhea, cough, shortness of breath or chest pain, joint or back pain, headache, or mood change.    Objective:   No results found. Recent Labs    06/28/21 0735  WBC 8.0  HGB 14.7  HCT 44.5  PLT 287     Recent Labs    06/28/21 0735  NA 138  K 3.9  CL 104  CO2 23  GLUCOSE 99  BUN 14  CREATININE 0.75  CALCIUM 10.2       Intake/Output Summary (Last 24 hours) at 06/28/2021 1234 Last data filed at 06/28/2021 0902 Gross per 24 hour  Intake 600 ml  Output --  Net 600 ml         Physical Exam: Vital Signs Blood pressure (!) 156/99, pulse 91, temperature 98.6 F (37 C), temperature source Oral, resp. rate 20, height 5\' 5"  (1.651 m), weight 60.9 kg, SpO2 100 %.    Constitutional: No distress . Vital signs reviewed. HEENT: EOMI, oral membranes moist Neck: supple Cardiovascular: RRR without murmur. No JVD    Respiratory/Chest: CTA Bilaterally without wheezes or rales. Normal effort    GI/Abdomen: BS +, non-tender, non-distended Ext: no clubbing, cyanosis, or edema Psych: pleasant and cooperative  Neurological: alert- doesn't remember LBM.     Comments: left C7. STM deficits. Left inattention present. Dense left hemiparesis without change. RUE and RLE 4/5. Decreased LT Left side  ongoing. DTR's 3+ on left side. Early tone MAS 1+ to 2/4 LUE and tr LLE. - wearing L resting wrist splint Musc: left shoulder/UE and Left hip/LE remain somewhat tender with PROM  Assessment/Plan: 1. Functional deficits which require 3+ hours per day of interdisciplinary therapy in a comprehensive inpatient rehab setting. Physiatrist is providing close team supervision and 24 hour management of active medical problems listed  below. Physiatrist and rehab team continue to assess barriers to discharge/monitor patient progress toward functional and medical goals  Care Tool:  Bathing    Body parts bathed by patient: Left arm, Chest, Abdomen, Front perineal area, Left upper leg, Right upper leg, Face   Body parts bathed by helper: Right lower leg, Buttocks, Left lower leg     Bathing assist Assist Level: Maximal Assistance - Patient 24 - 49%     Upper Body Dressing/Undressing Upper body dressing   What is the patient wearing?: Pull over shirt    Upper body assist Assist Level: Maximal Assistance - Patient 25 - 49%    Lower Body Dressing/Undressing Lower body dressing      What is the patient wearing?: Pants     Lower body assist Assist for lower body dressing: Maximal Assistance - Patient 25 - 49%     Toileting Toileting    Toileting assist Assist for toileting: Total Assistance - Patient < 25%     Transfers Chair/bed transfer  Transfers assist     Chair/bed transfer assist level: Maximal Assistance - Patient 25 - 49%     Locomotion Ambulation   Ambulation assist  Ambulation activity did not occur: Safety/medical concerns  Assist level: 2 helpers Assistive device: Other (comment) (3 Musketeer) Max distance: 47ft   Walk 10 feet activity   Assist  Walk 10 feet activity did not occur: Safety/medical concerns  Assist level: 2 helpers Assistive device:  (3 musketeers)   Walk 50 feet activity   Assist Walk 50 feet with 2 turns activity did not occur: Safety/medical concerns         Walk 150 feet activity   Assist Walk 150 feet activity did not occur: Safety/medical concerns         Walk 10 feet on uneven surface  activity   Assist Walk 10 feet on uneven surfaces activity did not occur: Safety/medical concerns         Wheelchair     Assist Will patient use wheelchair at discharge?: Yes Type of Wheelchair: Manual    Wheelchair assist level:  Dependent - Patient 0% Max wheelchair distance: 150    Wheelchair 50 feet with 2 turns activity    Assist        Assist Level: Dependent - Patient 0%   Wheelchair 150 feet activity     Assist      Assist Level: Dependent - Patient 0%   Blood pressure (!) 156/99, pulse 91, temperature 98.6 F (37 C), temperature source Oral, resp. rate 20, height 5\' 5"  (1.651 m), weight 60.9 kg, SpO2 100 %.    Medical Problem List and Plan: 1.  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              -patient may shower             -ELOS/Goals: 7/22, min assist goals  -Continue CIR therapies including PT, OT, and SLP  2.  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 Wife prefers no oral anticoagulation given hematoma.  -DVT/anticoagulation:  Mechanical:  Antiembolism stockings, knee (TED hose) Bilateral lower extremities             -antiplatelet therapy: N/A due to concerns of CAA 3. Left sided limb pain/low back pain: likely neuropathic pain and CT/exam/labs consistent with HO - continue lidocaine patch -increase gabapentin 200mg  tid arousal permitting -kpad for back -continue tizanidine for tone, 2mg  bid as tolerated -continue ROM, activities with therapy -7/18 pain improving? --wean prednisone 4. Arousal:  improved  -dc amantadine, increase ritalin to 10mg  bid for arousal/initiaton  7/17- calm, but more awake- con't regimen             -antipsychotic agents: N/A 5. Neuropsych: This patient is not fully capable of making decisions on his own behalf. 6. Skin/Wound Care: Routine pressure relief measures. 7. Fluids/Electrolytes/Nutrition: monitor fluid intake while on PO alone  7/18     -K+ sl low---repleting   -albumin improving   -labs all reviewed tody 8. Recurrent aspiration event:    -unasyn started 6/20---stopped 6/30             -- Wbc's 5k             --CXR recently stable, lungs clear 9.  Leucocytosis: resolved 10. Blood pressure:  Vitals:   06/27/21 1959 06/28/21 0411  BP: 137/88 (!) 156/99  Pulse: 96 91  Resp: 20 20  Temp: 97.6 F (36.4 C) 98.6 F (37 C)  SpO2: 100% 100%  Reasonable control 7/18 11. Progressive cognitive decline: To reschedule dementia work up at [facility] after d/c. 12. Hyperglycemia: Hgb  A1C- 5.8.             --Monitor BS every  4 hours and use SSI for elevated  BS   CBG (last 3)  Recent Labs    06/26/21 0636 06/27/21 0605 06/28/21 0559  GLUCAP 107* 113* 113*  Controlled, checking daily only.   13. Dysphagia: . D2/thins diet initiated!.              -megace trial VERY beneficial begin weaning. Has app for food again!    14. Tachycardia: 80's-90's - monitor HR TID 15. Elevated liver transaminases: Liver u/s with one small cystic structure.   -all LFT's nearly back wnl  -AP slightly elevated likely related to HO 16. Mild anemia-Ferritin elevated but Hgb, Fe++ and TIBC all low which point towards anemia of chronic disease.   17. Constipation  7/18 still no bm---sorbitol today again.      LOS: 24 days A FACE TO FACE EVALUATION WAS PERFORMED  Ranelle Oyster 06/28/2021, 12:34 PM

## 2021-06-28 NOTE — Progress Notes (Signed)
Speech Language Pathology Daily Session Note  Patient Details  Name: Charlis Harner MRN: [id number] Date of Birth: 17-Feb-1956  Today's Date: 06/28/2021 SLP Individual Time: 0715-0800 SLP Individual Time Calculation (min): 45 min  Short Term Goals: Week 4: SLP Short Term Goal 1 (Week 4): STGs=LTGs due to ELOS  Skilled Therapeutic Interventions: Skilled treatment session focused on cognitive and dysphagia goals. Upon arrival, patient had been incontinent of urine and his shirt had been soaked through. Patient with minimal awareness of this. Throughout bed mobility and while donning a new shirt, patient required extra time and Min A verbal and tactile cues to follow directions. Patient also appeared mildly resistive due to pain/discomfort with movement.  Patient consumed breakfast meal of Dys. 2 textures with thin liquids without overt s/s of aspiration but required supervision verbal cues for use of swallowing compensatory strategies. Recommend patient continue current diet. SLP provided Mod A multimodal cues for orientation to place and time today with intermittent language of confusion noted. Patient performed oral care via the suction toothbrush with Min verbal cues needed for cessation of task. Patient left upright in bed with alarm on and all needs within reach. Continue with current plan of care.     Pain Pain Assessment Pain Scale: 0-10 Pain Score: 0-No pain  Therapy/Group: Individual Therapy  Evie Croston 06/28/2021, 8:47 AM

## 2021-06-28 NOTE — Plan of Care (Signed)
  Problem: RH Toilet Transfers Goal: LTG Patient will perform toilet transfers w/assist (OT) Description: LTG: Patient will perform toilet transfers with assist, with/without cues using equipment (OT) Flowsheets (Taken 06/28/2021 0945) LTG: Pt will perform toilet transfers with assistance level of: Maximal Assistance - Patient 25 - 49%

## 2021-06-28 NOTE — Plan of Care (Signed)
  Problem: RH Bathing Goal: LTG Patient will bathe all body parts with assist levels (OT) Description: LTG: Patient will bathe all body parts with assist levels (OT) Flowsheets (Taken 06/28/2021 0941) LTG: Pt will perform bathing with assistance level/cueing: Maximal Assistance - Patient 25 - 49% Note: Goal downgraded 7/18 due to lack of progress-ESD   Problem: RH Dressing Goal: LTG Patient will perform lower body dressing w/assist (OT) Description: LTG: Patient will perform lower body dressing with assist, with/without cues in positioning using equipment (OT) Outcome: Not Applicable Flowsheets (Taken 06/28/2021 0941) LTG: Pt will perform lower body dressing with assistance level of: (dc goal 7/18-ESD) -- Note: D/c goal 7/18 due to lack of progress-ESD   Problem: RH Toileting Goal: LTG Patient will perform toileting task (3/3 steps) with assistance level (OT) Description: LTG: Patient will perform toileting task (3/3 steps) with assistance level (OT)  Outcome: Not Applicable Flowsheets (Taken 06/28/2021 0941) LTG: Pt will perform toileting task (3/3 steps) with assistance level: (dc goal 7/18-ESD) -- Note: D/c goal 7/18 due to lack of progress-ESD

## 2021-06-28 NOTE — Progress Notes (Signed)
Patient ID: Daekwon Beswick, male   DOB: 07-01-1956, 65 y.o.   MRN: [id number]  SW received phone call from pt wife reporting that her son and dtr were coming in for family education today. SW will alert therapy team. SW discussed recommended DME: hoyer lift, DABSC, and tilt in space w/c. SW informed her therapy will discuss in further detail.   SW informed therapy team on above.   SW ordered [employer] and DABSC with [employer] via parachute.   Cecile Sheerer, MSW, LCSWA Office: [phone] Cell: [phone] Fax: [phone]

## 2021-06-28 NOTE — Progress Notes (Signed)
Occupational Therapy Weekly Progress Note  Patient Details  Name: Donald [name] MRN: [id number] Date of Birth: 09/26/1956  Beginning of progress report period: June 05, 2021 End of progress report period: June 28, 2021  Today's Date: 06/28/2021 Session 1 OT Individual Time: 9379-0240 OT Individual Time Calculation (min): 60 min   Session 2 OT Individual Time: 1200-1230 OT Individual Time Calculation (min): 30 min    Patient has met 3 of 3 short term goals.  Patient has made slow but steady progress towards OT goals this week. Pt has progressed with transfers using the [place] and can stand in [place] with mod/max A. Sitting balance and L attention has also improved within functional BADL tasks. Continue current POC.   Patient continues to demonstrate the following deficits: muscle weakness, impaired timing and sequencing, abnormal tone, unbalanced muscle activation, motor apraxia, ataxia, decreased coordination, and decreased motor planning, decreased attention to left, left side neglect, and decreased motor planning, decreased initiation, decreased attention, decreased awareness, decreased problem solving, decreased safety awareness, decreased memory, and delayed processing, and decreased sitting balance, decreased standing balance, decreased postural control, hemiplegia, and decreased balance strategies and therefore will continue to benefit from skilled OT intervention to enhance overall performance with BADL and Reduce care partner burden.  Patient not progressing toward long term goals.  See goal revision..  Plan of care revisions: Goals downgraded to mod/max A overall.  OT Short Term Goals Week 3:  OT Short Term Goal 1 (Week 3): Patient will complete toilet transfer with max A of 1 OT Short Term Goal 1 - Progress (Week 3): Met OT Short Term Goal 2 (Week 3): Pt will recall hemi dressing techniques with min questioning cues OT Short Term Goal 2 - Progress (Week 3): Met OT Short Term  Goal 3 (Week 3): Patient will complete 1 step of UB dressing task OT Short Term Goal 3 - Progress (Week 3): Met Week 4:  OT Short Term Goal 1 (Week 4): LTG=STG 2.2 ELOS  Skilled Therapeutic Interventions/Progress Updates:  Session 1   Pt greeted semi-reclined in bed and agreeable to OT treatment session. Noted that pt had been incontinent of urine. Pt needed max A to roll to the R and was very gaurded from pain on L side of body. Flexor tone through L UE and L LE. Pt resistive to rolling. Mod A to then roll  to the L as pt could reach with R UE. Pt needed total A to get to sitting EOB. Pt initially pushing back and to the L needing max A to maintain sitting balance. OT facilitated anterior weight shift and provided verbal and visual cues to achieve supervision static sitting. OT thread pant legs, then used Stedy for sit<>stand with max A> Pt pushing to the L, but able to get stedy seat down. Pt tolerated sitting in Stedy at the sink while working on midline Teaching laboratory technician. UB bathing/dressing from [place] with max A. Blocked practice for sit<>stand from [employer], and then from wc seat height. Min A from [place] but mod/max A from wc height. OT placed pillow between knees for sit<>stands in stedy to keep LLE from adduction. OT placed SAEBO e-stim on shoulder. SAEBO left on for 60 minutes. OT returned to remove SAEBO with skin intact and no adverse reactions.  [place] Stim One 330 pulse width 35 Hz pulse rate On 8 sec/ off 8 sec Ramp up/ down 2 sec Symmetrical Biphasic wave form  Max intensity 180m at 500 Ohm  load  Patient left seated in TIS wc with alarm belt on and family present.   Session 2 Pt greeted seated in wc and agreeable to OT treatment session. Spouse present and OT discussed DME needs including possible hospital bed, specialty wc, drop arm BSC, and likely hoyer lift. OT educated on the need for 2 people for toilet transfers as one person will need to assist with  standing while +2 asissts with clothing management and hygiene. Nurse tech was +2 for transfer using Stedy to practice sit<>stands. Mod/max A sit<>stand in Stedy to transfer to [facility]. Pt unable to void bowel or bladder. Discussed with spouse having pt stand at counter and then +2 put BSC under patient for toilet transfers. OT issued handout for home measurement sheet to see if wc will fit through doorways. Pt left seated in TIS wc with alarm belt on, call bell in reach, wife present, and needs met.    Therapy Documentation Precautions:  Precautions Precautions: Fall Precaution Comments: L hemiparesis, L inattention, pusher to the L, cognitive deficits, cortrak Restrictions Weight Bearing Restrictions: No Pain:  Pt reports pain In L UE and L hip with movement. Rest and repositioned for comfort.   Therapy/Group: Individual Therapy  Valma Cava 06/28/2021, 1:03 PM

## 2021-06-28 NOTE — Progress Notes (Signed)
Soap suds enema administered per orders, pt tolerated well, will continue to monitor  Loleta Dicker LPN

## 2021-06-29 LAB — GLUCOSE, CAPILLARY: Glucose-Capillary: 94 mg/dL (ref 70–99)

## 2021-06-29 MED ORDER — GABAPENTIN 100 MG PO CAPS
100.0000 mg | ORAL_CAPSULE | Freq: Three times a day (TID) | ORAL | Status: DC
  Administered 2021-06-29 – 2021-07-01 (×6): 100 mg via ORAL
  Filled 2021-06-29 (×6): qty 1

## 2021-06-29 MED ORDER — TIZANIDINE HCL 2 MG PO TABS
2.0000 mg | ORAL_TABLET | Freq: Three times a day (TID) | ORAL | Status: DC
  Administered 2021-06-29 – 2021-07-02 (×9): 2 mg via ORAL
  Filled 2021-06-29 (×9): qty 1

## 2021-06-29 NOTE — Progress Notes (Signed)
Speech Language Pathology Daily Session Note  Patient Details  Name: Donald [name] MRN: [id number] Date of Birth: 1956/07/22  Today's Date: 06/29/2021 SLP Individual Time: 1308-1406 SLP Individual Time Calculation (min): 58 min  Short Term Goals: Week 4: SLP Short Term Goal 1 (Week 4): STGs=LTGs due to ELOS  Skilled Therapeutic Interventions: Skilled treatment session focused on cognitive goals. SLP facilitated session by providing extra time and overall Mod-Max A verbal and tactile cues for sustained attention, left visual scanning and error awareness during a sorting task from a field of 4. Patient also required Mod verbal cues for orientation to place and time with use of visual aids but was independently oriented to situation. Patient completed 25 repetitions of EMST at 20 cm H2O without adverse effects, patient unable to rate perceived effort level. Overall, patient continues to demonstrate mild confusion and poor awareness of deficits as he often does not recall he is in his hospital room and requests to walk frequently after meals. SLP providing ongoing education.  Patient left upright in the wheelchair with alarm on and all needs within reach. Continue with current plan of care.      Pain No/Denies Pain   Therapy/Group: Individual Therapy  Donald [name] 06/29/2021, 2:10 PM

## 2021-06-29 NOTE — Progress Notes (Signed)
PROGRESS NOTE   Subjective/Complaints:  Up in w/c. No new issues. No results with Sorbitol or SSE  ROS: Patient denies fever, rash, sore throat, blurred vision, nausea, vomiting, diarrhea, cough, shortness of breath or chest pain, joint or back pain, headache, or mood change.    Objective:   No results found. Recent Labs    06/28/21 0735  WBC 8.0  HGB 14.7  HCT 44.5  PLT 287     Recent Labs    06/28/21 0735  NA 138  K 3.9  CL 104  CO2 23  GLUCOSE 99  BUN 14  CREATININE 0.75  CALCIUM 10.2       Intake/Output Summary (Last 24 hours) at 06/29/2021 0959 Last data filed at 06/29/2021 0700 Gross per 24 hour  Intake 756 ml  Output 325 ml  Net 431 ml         Physical Exam: Vital Signs Blood pressure 137/80, pulse 71, temperature 97.9 F (36.6 C), temperature source Oral, resp. rate 18, height 5\' 5"  (1.651 m), weight 60.9 kg, SpO2 100 %.    Constitutional: No distress . Vital signs reviewed. HEENT: EOMI, oral membranes moist Neck: supple Cardiovascular: RRR without murmur. No JVD    Respiratory/Chest: CTA Bilaterally without wheezes or rales. Normal effort    GI/Abdomen: BS +, non-tender, non-distended Ext: no clubbing, cyanosis, or edema Psych: pleasant and cooperative  Neurological: alert- doesn't remember LBM.     Comments: left C7. STM deficits. Left inattention present. Dense left hemiparesis without change. RUE and RLE 4/5. Decreased LT Left side  ongoing. DTR's 3+ on left side. Early tone MAS 1+ to 2/4 LUE and 1+ LLE. - wearing L resting wrist splint, arm wedged inside of w/c arm Musc: left shoulder/UE and Left hip/LE remain tender with PROM  Assessment/Plan: 1. Functional deficits which require 3+ hours per day of interdisciplinary therapy in a comprehensive inpatient rehab setting. Physiatrist is providing close team supervision and 24 hour management of active medical problems listed  below. Physiatrist and rehab team continue to assess barriers to discharge/monitor patient progress toward functional and medical goals  Care Tool:  Bathing    Body parts bathed by patient: Left arm, Chest, Abdomen, Front perineal area, Left upper leg, Right upper leg, Face   Body parts bathed by helper: Right lower leg, Buttocks, Left lower leg     Bathing assist Assist Level: Maximal Assistance - Patient 24 - 49%     Upper Body Dressing/Undressing Upper body dressing   What is the patient wearing?: Pull over shirt    Upper body assist Assist Level: Maximal Assistance - Patient 25 - 49%    Lower Body Dressing/Undressing Lower body dressing      What is the patient wearing?: Pants     Lower body assist Assist for lower body dressing: Maximal Assistance - Patient 25 - 49%     Toileting Toileting    Toileting assist Assist for toileting: Total Assistance - Patient < 25%     Transfers Chair/bed transfer  Transfers assist     Chair/bed transfer assist level: Maximal Assistance - Patient 25 - 49%     Locomotion Ambulation   Ambulation  assist   Ambulation activity did not occur: Safety/medical concerns  Assist level: 2 helpers Assistive device: Other (comment) (3 Musketeer) Max distance: 26ft   Walk 10 feet activity   Assist  Walk 10 feet activity did not occur: Safety/medical concerns  Assist level: 2 helpers Assistive device:  (3 musketeers)   Walk 50 feet activity   Assist Walk 50 feet with 2 turns activity did not occur: Safety/medical concerns         Walk 150 feet activity   Assist Walk 150 feet activity did not occur: Safety/medical concerns         Walk 10 feet on uneven surface  activity   Assist Walk 10 feet on uneven surfaces activity did not occur: Safety/medical concerns         Wheelchair     Assist Will patient use wheelchair at discharge?: Yes Type of Wheelchair: Manual    Wheelchair assist level:  Dependent - Patient 0% Max wheelchair distance: 150    Wheelchair 50 feet with 2 turns activity    Assist        Assist Level: Dependent - Patient 0%   Wheelchair 150 feet activity     Assist      Assist Level: Dependent - Patient 0%   Blood pressure 137/80, pulse 71, temperature 97.9 F (36.6 C), temperature source Oral, resp. rate 18, height 5\' 5"  (1.651 m), weight 60.9 kg, SpO2 100 %.    Medical Problem List and Plan: 1.  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              -patient may shower             -ELOS/Goals: 7/22, min assist goals  -Continue CIR therapies including PT, OT, and SLP  2.  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 Wife prefers no oral anticoagulation given hematoma.  -DVT/anticoagulation:  Mechanical:  Antiembolism stockings, knee (TED hose) Bilateral lower extremities             -antiplatelet therapy: N/A due to concerns of CAA 3. Left sided limb pain/low back pain: likely neuropathic pain and CT/exam/labs consistent with HO - continue lidocaine patch -increase gabapentin 200mg  tid arousal permitting -kpad for back -continue tizanidine for tone, 2mg  bid as tolerated -continue ROM, activities with therapy -7/19 pain +/- improved. Weaning steroid  -increase tizanidine to tid 4. Arousal:  improved  -dc amantadine, increase ritalin to 10mg  bid for arousal/initiaton  7/17- calm, but more awake- con't regimen             -antipsychotic agents: N/A 5. Neuropsych: This patient is not fully capable of making decisions on his own behalf. 6. Skin/Wound Care: Routine pressure relief measures. 7. Fluids/Electrolytes/Nutrition: monitor fluid intake while on PO alone  7/18     -K+ sl low---repleting   -albumin improving   -labs recently reviewed 8. Recurrent aspiration event:    -unasyn started 6/20---stopped 6/30             -- Wbc's 5k             --CXR recently  stable, lungs clear 9. Leucocytosis: resolved 10. Blood pressure:  Vitals:   06/28/21 2118 06/29/21 0628  BP: (!) 143/100 137/80  Pulse: 88 71  Resp: 18 18  Temp: 98.3 F (36.8 C) 97.9 F (36.6 C)  SpO2: 100% 100%  Reasonable control 7/18 11. Progressive cognitive decline: To reschedule dementia work up  at [facility] after d/c. 12. Hyperglycemia: Hgb A1C- 5.8.             --Monitor BS every  4 hours and use SSI for elevated  BS   CBG (last 3)  Recent Labs    06/27/21 0605 06/28/21 0559 06/29/21 0644  GLUCAP 113* 113* 94  Controlled 7/19.   13. Dysphagia: . D2/thins diet initiated!.              -megace trial VERY beneficial begin weaning. Has app for food again!    14. Tachycardia: 80's-90's - monitor HR TID 15. Elevated liver transaminases: Liver u/s with one small cystic structure.   -all LFT's nearly back wnl  -AP slightly elevated likely related to HO 16. Mild anemia-Ferritin elevated but Hgb, Fe++ and TIBC all low which point towards anemia of chronic disease.   17. Constipation  7/19 still no bm.  mg citrate today     LOS: 25 days A FACE TO FACE EVALUATION WAS PERFORMED  Ranelle Oyster 06/29/2021, 9:59 AM

## 2021-06-29 NOTE — Progress Notes (Signed)
Nutrition Follow-up  DOCUMENTATION CODES:   Non-severe (moderate) malnutrition in context of chronic illness  INTERVENTION:  Continue 30 ml Prosource plus po BID, each supplement provides 100 kcal and 15 grams of protein.   Continue Boost Breeze po once daily, each supplement provides 250 kcal and 9 grams of protein   Encourage adequate PO intake.   NUTRITION DIAGNOSIS:   Moderate Malnutrition related to chronic illness as evidenced by mild fat depletion, moderate muscle depletion; ongoing  GOAL:   Patient will meet greater than or equal to 90% of their needs; progressing  MONITOR:   PO intake, Supplement acceptance, Diet advancement, Skin, Weight trends, Labs, I & O's  REASON FOR ASSESSMENT:   New TF    ASSESSMENT:   65 year old right-handed male with history of OA and and progressive dementia work-up who was originally admitted on 05/11/2021 with acute left hemiparesis with sensory deficits, right gaze preference and left facial droop due to 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 CT of head done 6/12 revealing interval progression of right frontal hematoma with worsening of mass-effect and midline shift. CT brain repeated showing truncated arterial cortical branch at upper and lateral hematoma. He continues to be limited by left sided weakness with right gaze preference and inability to cross midline, cognitive deficits with delay in processing and motor planning deficits, left hemiplegia with emerging tone affecting mobility and ADLs CIR recommended due to functional decline Cortrak NGT removed 7/7.  Pt continues on a dysphagia 2 diet with thin liquids. Meal completion has been 50-100%. Pt currently has nutritional supplements ordered and has been consuming them. RD to continue with current orders to aid in caloric and protein needs. Labs and medications reviewed.   Diet Order:   Diet Order             DIET DYS 2 Room service appropriate? Yes; Fluid  consistency: Thin  Diet effective 0500 tomorrow                   EDUCATION NEEDS:   Not appropriate for education at this time  Skin:  Skin Assessment: Reviewed RN Assessment Skin Integrity Issues:: Other (Comment) Other: puncture R neck  Last BM:  7/14  Height:   Ht Readings from Last 1 Encounters:  06/04/21 5\' 5"  (1.651 m)    Weight:   Wt Readings from Last 1 Encounters:  06/29/21 60.9 kg    Ideal Body Weight:  61.8 kg  BMI:  Body mass index is 22.34 kg/m.  Estimated Nutritional Needs:   Kcal:  1850-2050  Protein:  90-110 grams  Fluid:  >/= 1.9 L/day  07/01/21, MS, RD, LDN RD pager number/after hours weekend pager number on Amion.

## 2021-06-29 NOTE — Patient Care Conference (Signed)
Inpatient RehabilitationTeam Conference and Plan of Care Update Date: 06/29/2021   Time: 10:30 AM    Patient Name: Donald [name]      Medical Record Number: [id number]  Date of Birth: February 21, 1956 Sex: Male         Room/Bed: 4W05C/4W05C-01 Payor Info: Payor: Advertising copywriter / Plan: GEHA / Product Type: *No Product type* /    Admit Date/Time:  06/04/2021  4:18 PM  Primary Diagnosis:  Intraparenchymal hematoma of brain [facility])  Hospital Problems: Principal Problem:   Intraparenchymal hematoma of brain (HCC) Active Problems:   Transaminitis    Expected Discharge Date: Expected Discharge Date:  (SNF)  Team Members Present: Physician leading conference: Dr. Faith Rogue Care Coodinator Present: Cecile Sheerer, LCSWA;Narciso Stoutenburg Marlyne Beards, RN, BSN, CRRN Nurse Present: Kennyth Arnold, RN PT Present: Malachi Pro, PT OT Present: Kearney Hard, OT SLP Present: Feliberto Gottron, SLP PPS Coordinator present : Fae Pippin, SLP     Current Status/Progress Goal Weekly Team Focus  Bowel/Bladder   Incontinent of B/B LBM 07/14 given Sorbital 07/17    07/18 soap suds enema on 07/18 miralax  continent of B/B with normal bowel pattern  Timed Toileting laxatives/stool softners as ordererd   Swallow/Nutrition/ Hydration   Dys. 2 textures with thin liquids, Min A  Min A  trials of Dys. 3 textures, family education   ADL's   Max A  Goals downgraded to mod/max A  L UE NMR, NMES, dc planning, pt/family education, self-care retraining, sit<>stands   Mobility   modA bed mobility, maxA transfers, ongoing issues with pain  minA to modA  Sitting balance, L hemibody NMR, transfers   Communication   Min A  Min A  Family Education   Safety/Cognition/ Behavioral Observations  Min-Mod A  Min A  orientstion, attention, left visual scanning   Pain   denies  free of pain  assess qshift and prn medicate as directed and monitor for relief   Skin   skin intact  remain free of breakdown or infection  assess  skin qshift and prn     Discharge Planning:  SNF vs home? SW to confirm d/c plan with wife. SW sent out SNF referral. Family edu completed on 7/18 with wife, son and dtr.   Team Discussion: Left hip tone, on steroids, sometimes helps. Eating better. Requiring a soap suds enema. Incontinent B/B, pain to left leg, using muscle rub.  Patient on target to meet rehab goals: no, making minimal progress. Max assist d/t pain on the left side. Downgraded goal to mod assist. Sits at an angle. Not any progress with PT. Speech intelligibility has improved. Dys 2, thin liquids.   *See Care Plan and progress notes for long and short-term goals.   Revisions to Treatment Plan:  Adjusting spacticity medications, and bowel regimen.  Teaching Needs: Family education, medication management, pain management, bowel/bladder management, transfer training, gait training, balance training, endurance training, safety awareness.  Current Barriers to Discharge: Decreased caregiver support, Medical stability, Home enviroment access/layout, Incontinence, Lack of/limited family support, Medication compliance, Behavior, and Nutritional means  Possible Resolutions to Barriers: Continue current medications, provide emotional support.     Medical Summary Current Status: hemiplegic left shoulder, heterotopic bone around left hip? also increased tone. addressing pain, range of motion. constipated. has been eating better  Barriers to Discharge: Medical stability   Possible Resolutions to Barriers/Weekly Focus: adjusting spasticity,pain mgt regimen. bowel program. daily assessment of labs/pt data   Continued Need for Acute Rehabilitation Level of Care: The patient  requires daily medical management by a physician with specialized training in physical medicine and rehabilitation for the following reasons: Direction of a multidisciplinary physical rehabilitation program to maximize functional independence : Yes Medical  management of patient stability for increased activity during participation in an intensive rehabilitation regime.: Yes Analysis of laboratory values and/or radiology reports with any subsequent need for medication adjustment and/or medical intervention. : Yes   I attest that I was present, lead the team conference, and concur with the assessment and plan of the team.   Tennis Must 06/29/2021, 4:30 PM

## 2021-06-29 NOTE — Progress Notes (Signed)
Physical Therapy Session Note  Patient Details  Name: Donald [name] MRN: [id number] Date of Birth: 1956/02/21  Today's Date: 06/29/2021 PT Individual Time: 6644-0347 PT Individual Time Calculation (min): 55 min   Short Term Goals: Week 4:  PT Short Term Goal 1 (Week 4): STGs = LTGs  Skilled Therapeutic Interventions/Progress Updates:     Pt received seated in WC and agrees to therapy. No complaint of pain. WC transport to gym for time management. Pt performs stand pivot transfer to mat table with maxA +1. During transfer, pt encouraged to remain standing for as long as possible for strengthening of both legs. PT provides maxA throughout for posture and balance, with pt demonstrating little to no activation of hip extensors. From edge of mat, pt performs multiple sit to stand transfers with various techniques, routinely requiring maxA +2. PT blocks L knee and provides manual facilitation of hip extension, with tech assisting to encourage upright trunk posturing. Pt has extreme difficulty with trunk extension, despite max verbal and tactile cues to do so. Pt demos significant flexor tone in anterior trunk, affecting posture and ability to weight shift for assistance with transfers. PT cues pt extensively on need to shift weight anteriorly for transitional movements but pt not able to significantly perform without heavy assistance at trunk. Pt requires extended seated rest breaks following each attempt at standing.   Stand pivot back to WC with maxA +1. Pt left seated with alarm intact and all needs within reach.  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

## 2021-06-29 NOTE — Progress Notes (Signed)
Occupational Therapy Session Note  Patient Details  Name: Donald [name] MRN: [id number] Date of Birth: 18-Jan-1956  Today's Date: 06/29/2021 Session 1 OT Individual Time: 5462-7035 OT Individual Time Calculation (min): 60 min   Session 2 OT Individual Time: 1240-1306 OT Individual Time Calculation (min): 26 min    Short Term Goals: Week 4:  OT Short Term Goal 1 (Week 4): LTG=STG 2.2 ELOS  Skilled Therapeutic Interventions/Progress Updates:    Session 1 Pt greeted semi-reclined in bed and agreeable to OT treatment session focused on self-care retraining at shower level. Pt completed bed mobility with total A 2/2 gaurding of L side 2/2 pain and tone in L side. Worked on sitting balance at EOB with max A, progressing to min/CGA. Stand-pivot bed>roll in shower chair with max A. Bathing completed from roll-in shower chair with improved initiation to wash body parts. Used cut out in seat to wash pt's buttocks and peri-area. Hand over hand A to integrate L U E for neuro re-ed. UB dressing with mod cues to locate L UE and help pull fabric over arm. Sit<>stands at the sink while +2 assisted with pulling up pants and exchanging shower chiar for w/c due to space constraints. ROM to L UE then OT placed SAEBO e-stim. SAEBO left on for 60 minutes. OT returned to remove SAEBO with skin intact and no adverse reactions.  Saebo Stim One 330 pulse width 35 Hz pulse rate On 8 sec/ off 8 sec Ramp up/ down 2 sec Symmetrical Biphasic wave form  Max intensity 127m at 500 Ohm load  Pt left seated in TIS wc with alarm belt on, call bell in reach, and needs met.   Session 2 Pt greeted sitting in TIS wc and reported he had already eaten lunch. Noted L hand/forearm kinesiotape was peeling off, so OT removed old tape and cleaned tape residue. OT checked skin with no areas of redness or skin breakdown. OT re-applied kinesiotape to encourage extension of digits, circulation, pain, and to help bring attention to L  UE. OT brought pt through gentle self ROM exercises including wrist flex/ext, pronation/supination, elbow flex/ext, and shoulder flex/ext. Pt reported pain in L UE throughout exercises but did better when he was in control of the movements. PT left seated in TIS wc with alarm belt on, call bell in reach and needs met.   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

## 2021-06-29 NOTE — Progress Notes (Addendum)
Patient ID: Donald [name], male   DOB: 1956/11/14, 65 y.o.   MRN: [id number]  7/18- SW received updates from therapy reporting that the wife felt she was not able to take care of pt at this time based on his care needs. States she would like to explore SNF. SW sent out SNF list; pt has NCPASRR#.  7/19- SW met with pt and pt wife in room to provide updates from team conference, and discuss above. Wife confirms she would like to explore SNF. States she has a 11am tour time scheduled with [facility] at [place]. SW provided her with SNF list based on insurance coverage [facility]). SW stated will f/u on Thursday to get preference. SW stressed that everything is based on insurance approval. Discussed what things look like if pt has to go home- pt will get [facility] therapies and request an aide for personal care. She does confirm home is too small for a tilt-n-space w/c and hoyer lift.   *SW received return phone call from pt wife to discuss family education in the event insurance declines. Family edu on TR (7/21) 10am-12pm and Fri (7/22) 9am-12pm. Family is prepared to buy a stedy if needed; will bring in pictures to show therapy for approval on which is best one to purchase.   Loralee Pacas, MSW, [place] Office: [phone] Cell: [phone] Fax: [phone]

## 2021-06-30 LAB — GLUCOSE, CAPILLARY
Glucose-Capillary: 97 mg/dL (ref 70–99)
Glucose-Capillary: 150 mg/dL — ABNORMAL HIGH (ref 70–99)

## 2021-06-30 NOTE — Progress Notes (Signed)
Occupational Therapy Session Note  Patient Details  Name: Donald [name] MRN: [id number] Date of Birth: February 07, 1956  Today's Date: 06/30/2021 OT Individual Time: 1000-1115 OT Individual Time Calculation (min): 75 min    Short Term Goals: Week 4:  OT Short Term Goal 1 (Week 4): LTG=STG 2.2 ELOS  Skilled Therapeutic Interventions/Progress Updates:    Pt received supine with c/o pain in his R hamstring described as cramping. Gentle PROM provided. Pt with obvious cognitive deficits throughout session, requires mod-max cueing to follow directions, easily distractible and in pain with little movement. Pt was transferred to EOB with max A. He required mod-max A for sitting balance support with posterior and L LOB. He initiated UB ADLs before reporting he needs to have a BM, and requesting to get to [name]. Stedy used- max A to stand. Pt transferred to [facility] via stedy and able to remain sitting with lateral supports with CGA. Pt began reporting nausea and profusely sweating- becoming extremely diaphoretic. Pt becoming more confused and OT unable to get BP reading d/t pt moving so much. Assumed orthostatic event and with total A brought pt into standing in the stedy and quickly transferred pt back to bed and into trend position. BP assessed once pt safely supine- 87/69 a few minutes into supine, trend position. After 10 min assessed pt again in regular supine, twice with the dynamap and once manually and received reading of 130/102. LPN AL alerted to all of these events. Pt reported need to have BM again and a bedpan was placed. He completed rolling R with mod A and L with max A. Pt required mod cueing for hand placement. Pt constantly trying to take bedpan off and touch his bottom, redirection provided. Pt voided just several drops of a very loose stool. Pt completed rolling R and total A for peri hygiene. Pt was left supine with all needs met, bed alarm set and wife present. LPN aware of all events.   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

## 2021-06-30 NOTE — Progress Notes (Signed)
Patient slept for 7 hours. Incontinent care provided throughout the night. Dulcolax suppository given this am, pending results.

## 2021-06-30 NOTE — Progress Notes (Signed)
Physical Therapy Session Note  Patient Details  Name: Donald [name] MRN: [id number] Date of Birth: Sep 14, 1956  Today's Date: 06/30/2021 PT Individual Time: 1300-1410 PT Individual Time Calculation (min): 70 min   Short Term Goals: Week 1:  PT Short Term Goal 1 (Week 1): Pt will perform bed mobiltiy with max assist  of 1. PT Short Term Goal 1 - Progress (Week 1): Met PT Short Term Goal 2 (Week 1): Pt will tolerate sitting in WC >2 hours PT Short Term Goal 2 - Progress (Week 1): Met PT Short Term Goal 3 (Week 1): Pt will maintain sitting balance EOB with max assist up to 2 minutes PT Short Term Goal 3 - Progress (Week 1): Met PT Short Term Goal 4 (Week 1): Pt will transfer to [facility] with max assist of 2 consistently PT Short Term Goal 4 - Progress (Week 1): Met Week 2:  PT Short Term Goal 1 (Week 2): Pt will perform bed<>WC transfer with max assist of 1 PT Short Term Goal 1 - Progress (Week 2): Met PT Short Term Goal 2 (Week 2): Pt will perform gait training at rail in hall x 39ft with max A +2 PT Short Term Goal 2 - Progress (Week 2): Progressing toward goal PT Short Term Goal 3 (Week 2): Pt will perform WC propulsion x28ft and mod assist using hemi technique PT Short Term Goal 3 - Progress (Week 2): Progressing toward goal PT Short Term Goal 4 (Week 2): Pt will perform sitting balance EOB with mod assist up to 5 minutes PT Short Term Goal 4 - Progress (Week 2): Progressing toward goal Week 3:  PT Short Term Goal 1 (Week 3): Pt will perform bed mobility with modA +1. PT Short Term Goal 1 - Progress (Week 3): Met PT Short Term Goal 2 (Week 3): Pt will perform sitting balance EOB with modA >5 minutes. PT Short Term Goal 2 - Progress (Week 3): Met PT Short Term Goal 3 (Week 3): Pt will perform gait training  x 42ft with max A +2 PT Short Term Goal 3 - Progress (Week 3): Met PT Short Term Goal 4 (Week 3): Pt will perform WC propulsion x84ft and mod assist using hemi technique PT Short Term  Goal 4 - Progress (Week 3): Progressing toward goal  Skilled Therapeutic Interventions/Progress Updates:     Pt initially supine w/wife at bedside.  Agrees to treatment.  Pt max assist for rolling for donning of shorts and socks by therapist.  Supine to sit on edge of bed max assist of 2, pt pushing w/LUE. Worked on reaching toward foot of bed w/RUE to decrease pushing, elogate trunk on L.   Wife states she is planning to order "Stedy" (off brand) from [place] for ease of caregiver burden.  Confirms she plans to take pt home this Friday.  Encouraged wife to engage in today's session for additional training.  Wife explains she will wait for scheduled session 7/21 but due to extent of care needs therapist strongly encouraged wife who agreed to training.  Therapist educated wife on operation of Stedy brakes and seat paddles.  BP in sitting 115/91.  Pt c/o pain w/gentle positioning of LLE>LUE but significant pain w/both.  Appears neuropathic in nature and increased sensitivity from prior sessions w/this therapist. Sit to stand in stedy requires mod assist +2 due to strong pushing to L w/RUE/RLE today.  Educated wife on positioning of RUE to minimize pushing and therapist positioning to L for safety due to  L lean.  Also discussed ardous nature of this and need for + 2 even w/Stedy.  Repeated mult efforts for education.  Pt reports urgent need for BM.  +2 bed to [facility] using Stedy, total assist for clothing management and demonstrated for wife as max assist of +1 for realistic possibility at home if reliant on Stedy. Pt was able to have small solid BM.  Pt c/o discomfort in sitting, very restless during BM and praying for mercy to his God during activity.  Applied cold cloth to forehead/face.  Appears pale but unable to obtain BP w/mult efforts due to restlessness/inability to keep R arm still.  Total assist of 2 for pericare, clothing management, transfer back to edge of bed w/Stedy.   Max assist of 2 for sit to  supine.  Lips appearing pale, pt c/o nausea.  BP immediately assessed 66/37.   Bed placed in [place]         BP 74/49 Trendelenberg x 2 min                 bp 98/70 Trendelenberg x 3.5 min              bp 129/97 Pt returned to level and color normal, pt reports resolution of sxs.  Nursing notified of BP issues during BM, likely vasovagal in nature but requested contined monitoring/assessment of pt.    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

## 2021-06-30 NOTE — Progress Notes (Signed)
Patient ID: Donald [name], male   DOB: [birth year], 65 y.o.   MRN: [id number]  SW saw patient's wife who reported that she has decided that she will now take him home. States she will show the therapists today the stedy she intends to purchase. SW informed will provide updates to medical team and will discuss when pt will be discharged.   *SW waiting on updates from medical team on d/c date.   Cecile Sheerer, MSW, LCSWA Office: [phone] Cell: [phone] Fax: [phone]

## 2021-06-30 NOTE — Progress Notes (Signed)
Speech Language Pathology Daily Session Note  Patient Details  Name: Donald [name] MRN: [id number] Date of Birth: 1956-06-28  Today's Date: 06/30/2021 SLP Individual Time: 0705-0800 SLP Individual Time Calculation (min): 55 min  Short Term Goals: Week 4: SLP Short Term Goal 1 (Week 4): STGs=LTGs due to ELOS  Skilled Therapeutic Interventions: Skilled treatment session focused on cognitive goals. Upon arrival, patient yelling out in pain due to "burning" sensation in rectum. Per RN, patient with severe constipation and received a suppository this morning. Patient provided education regarding source of pain but was unable to fully understand requiring multiple repetitions of information. Patient declined his breakfast despite education but did consume several sips of liquids without overt s/s of aspiration. Patient appeared restless in bed due to pain and frequently requesting repositioning, removal of blankets, etc. Patient required encouragement to attempt to complete tasks independently. Max A verbal cues were also needed for overall attention to conversation/tasks as patient demonstrated intermittent language of confusion. Patient left semi-reclined in bed with alarm on, all needs within reach and meditation music playing. Continue with current plan of care.        Pain Pain in rectum/stomach RN aware, patient repositioned and medication administered   Therapy/Group: Individual Therapy  Ardis Fullwood 06/30/2021, 8:01 AM

## 2021-07-01 LAB — GLUCOSE, CAPILLARY
Glucose-Capillary: 122 mg/dL — ABNORMAL HIGH (ref 70–99)
Glucose-Capillary: 168 mg/dL — ABNORMAL HIGH (ref 70–99)

## 2021-07-01 MED ORDER — POLYETHYLENE GLYCOL 3350 17 G PO PACK
17.0000 g | PACK | Freq: Two times a day (BID) | ORAL | Status: DC
  Administered 2021-07-01 – 2021-07-12 (×17): 17 g via ORAL
  Filled 2021-07-01 (×19): qty 1

## 2021-07-01 MED ORDER — PREDNISONE 5 MG PO TABS
10.0000 mg | ORAL_TABLET | Freq: Every day | ORAL | Status: AC
  Administered 2021-07-02 – 2021-07-03 (×2): 10 mg via ORAL
  Filled 2021-07-01 (×2): qty 2

## 2021-07-01 MED ORDER — GABAPENTIN 600 MG PO TABS
300.0000 mg | ORAL_TABLET | Freq: Two times a day (BID) | ORAL | Status: DC
  Administered 2021-07-01 – 2021-07-02 (×4): 300 mg via ORAL
  Filled 2021-07-01 (×5): qty 1

## 2021-07-01 NOTE — Progress Notes (Signed)
Occupational Therapy Session Note  Patient Details  Name: Donald [name] MRN: [id number] Date of Birth: 1956-10-13  Today's Date: 07/01/2021 OT Individual Time: 4496-7591 OT Individual Time Calculation (min): 60 min    Short Term Goals: Week 4:  OT Short Term Goal 1 (Week 4): LTG=STG 2.2 ELOS  Skilled Therapeutic Interventions/Progress Updates:  Met pt lying in bed, bed alarm on, pt agreed to session. Treatment session was focused on self-care retraining. OT encouraged pt to complete bed mobility from flat surface, due to upcoming d/c home. Pt was 2 helpers for bed mobility > EOB. Pt was Max A for sit > stand transfer to Stedy > w/c. Pt brought to sink, required Max A for UB bathing and dressing  and total A for footwear. OT demonstrated hemi-dressing techniques with no carryover from pt. Throughout session, pt wanted OT and OTS to "be careful" when handling LLE. OT applied Saebo prior to leaving room, will retrieve during 2/2 session.  Pt was left in w/c, safety belt on, all needs met.   Pt's wife present during last 10 minutes of session, OT informed wife of family education session later in morning.   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

## 2021-07-01 NOTE — Progress Notes (Signed)
Speech Language Pathology Weekly Progress and Session Note  Patient Details  Name: Donald [name] MRN: [id number] Date of Birth: 1956-01-13  Beginning of progress report period: June 25, 2021 End of progress report period: July 01, 2021  Today's Date: 07/01/2021 SLP Individual Time: 9379-0240 SLP Individual Time Calculation (min): 25 min  Short Term Goals: Week 4: SLP Short Term Goal 1 (Week 4): STGs=LTGs due to ELOS SLP Short Term Goal 1 - Progress (Week 4): Progressing toward goal    New Short Term Goals: Week 5: SLP Short Term Goal 1 (Week 5): STGs=LTGs due to ELOS  Weekly Progress Updates: Patient continues to make functional progress towards goals. Patient's discharge date has been extended due to change in original discharge plan and to maximize time for family education. Patient is consuming Dys. 2 textures with thin liquids with minimal overt s/s of aspiration and overall supervision level verbal cues for use of swallowing compensatory strategies. Patient's overall cognitive functioning continues to fluctuate and patient requires overall Min-Mod A multimodal cues for orientation and attention to tasks. Patient and family education ongoing. Patient would benefit from continued skilled SLP intervention to maximize his swallowing and cognitive functioning prior to discharge.    Intensity: Minumum of 1-2 x/day, 30 to 90 minutes Frequency: 3 to 5 out of 7 days Duration/Length of Stay: 07/07/21 Treatment/Interventions: Cognitive remediation/compensation;Dysphagia/aspiration precaution training;Internal/external aids;Speech/Language facilitation;Environmental controls;Cueing hierarchy;Functional tasks;Patient/family education;Therapeutic Activities   Daily Session  Skilled Therapeutic Interventions:  Skilled treatment session focused on family education with the patient and his wife. Both were educated regarding patient's current swallowing function, diet recommendations, appropriate  textures, swallowing compensatory strategies and medication administration. The patient's wife was also educated regarding speech intelligibility strategies, RMT and importance of 24 hour supervision and utilization of a routine to maximize basic memory and cognition at home.  She verbalized understanding of all information and handouts were also given to reinforce information. Patient handed off to OT. Continue with current plan of care.     Pain Pain Assessment Pain Scale: 0-10 Pain Score: 0-No pain Faces Pain Scale: No hurt  Therapy/Group: Individual Therapy  Eloina Ergle 07/01/2021, 12:10 PM

## 2021-07-01 NOTE — Plan of Care (Signed)
  Problem: RH Balance Goal: LTG: Patient will maintain dynamic sitting balance (OT) Description: LTG:  Patient will maintain dynamic sitting balance with assistance during activities of daily living (OT) Flowsheets (Taken 07/01/2021 0857) LTG: Pt will maintain dynamic sitting balance during ADLs with: Moderate Assistance - Patient 50 - 74% Note: LTG downgraded 2/2 lack of progress- ESD 7/21   Problem: Sit to Stand Goal: LTG:  Patient will perform sit to stand in prep for activites of daily living with assistance level (OT) Description: LTG:  Patient will perform sit to stand in prep for activites of daily living with assistance level (OT) Flowsheets (Taken 07/01/2021 0802) LTG: PT will perform sit to stand in prep for activites of daily living with assistance level: Maximal Assistance - Patient 25 - 49% Note: Goal downgraded 7/21 due to lack of progress-ESD   Problem: RH Pre-functional/Other (Specify) Goal: RH LTG OT (Specify) 1 Description: RH LTG OT (Specify) 1 Flowsheets (Taken 07/01/2021 0800) LTG: Other OT (Specify) 1: Caregiver will demonstrate proficiency with STedy transfer   Problem: RH Pre-functional/Other (Specify) Goal: RH LTG OT (Specify) 2 Description: RH LTG OT (Specify) 2  Flowsheets (Taken 07/01/2021 0801) LTG: Other OT (Specify) 2: Caregiver will demonstrate L UE positioning and ROM techniques

## 2021-07-01 NOTE — Progress Notes (Signed)
Occupational Therapy Session Note  Patient Details  Name: Donald [name] MRN: [id number] Date of Birth: October 20, 1956  Today's Date: 07/01/2021 OT Individual Time: 1120-1202 OT Individual Time Calculation (min): 42 min    Short Term Goals: Week 4:  OT Short Term Goal 1 (Week 4): LTG=STG 2.2 ELOS  Skilled Therapeutic Interventions/Progress Updates:    Pt greeted seated in TIS wc and agreeable to OT treatment session focused on family education. OT educated on self-ROM techniques, demonstrated L UE stretching, ROM, and use of PRAFO and resting hand splint. Educated wife on body mechanics and positioning to best assist with transfer using Stedy.  Educated family on wc mechanics including leg rests. Discussed DME needs and answered any questions regarding BADL participation at home. Pt left seated on BSC with Stedy in front of pt, wife present, and nurse tech to assist with transfer off of commode.  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 Rest and repositioned for comfort.   Therapy/Group: Individual Therapy  Mal Amabile 07/01/2021, 12:09 PM

## 2021-07-01 NOTE — Progress Notes (Signed)
Physical Therapy Session Note  Patient Details  Name: Donald [name] MRN: [id number] Date of Birth: Sep 28, 1956  Today's Date: 07/01/2021 PT Individual Time: 1000-1054 PT Individual Time Calculation (min): 54 min   Short Term Goals: Week 4:  PT Short Term Goal 1 (Week 4): STGs = LTGs  Skilled Therapeutic Interventions/Progress Updates:     Pt received seated in WC and agrees to therapy. Wife present for family education. Pt reports that he has had bowel movement in his brief. Pt performs squat pivot to bed with modA +1 and sit to supine with maxA. PT checks brief and brief noted to be clean. Supine to sit with maxA and stand pivot to chair with modA. WC transport to gym for time management. PT educates on parts and operation of Stedy, then has pt perform sit to stand transfer with maxA. Once standing, pt achieves much improved posture relative to all previous sessions with trunk extended and head up. Pt remains seated in [place] and performs several activities to engage core, including rotating to the R and L and reaching outside BOS, though requires modA when reaching. Pt returns to [facility] and wife practices Stedy transfer. PT provides supervision and cues on safety, with wife demonstrating good follow through. Wife able to assist pt safely with sit to stand transfer into and out of stedy. WC transport back to room. Pt left seated in WC with alarm intact and all needs within reach.  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

## 2021-07-01 NOTE — Progress Notes (Signed)
PROGRESS NOTE   Subjective/Complaints:  Had 2 bm's yesterday. C/o pain on left side when I moved limbs today---at left medial thigh and left wrist  ROS: Limited due to cognitive/behavioral    Objective:   No results found. No results for input(s): WBC, HGB, HCT, PLT in the last 72 hours.    No results for input(s): NA, K, CL, CO2, GLUCOSE, BUN, CREATININE, CALCIUM in the last 72 hours.      Intake/Output Summary (Last 24 hours) at 07/01/2021 0929 Last data filed at 07/01/2021 0800 Gross per 24 hour  Intake 180 ml  Output --  Net 180 ml         Physical Exam: Vital Signs Blood pressure (!) 143/95, pulse (!) 106, temperature 99 F (37.2 C), temperature source Oral, resp. rate 17, height 5\' 5"  (1.651 m), weight 58.5 kg, SpO2 100 %.    Constitutional: No distress . Vital signs reviewed. HEENT: EOMI, oral membranes moist Neck: supple Cardiovascular: RRR without murmur. No JVD    Respiratory/Chest: CTA Bilaterally without wheezes or rales. Normal effort    GI/Abdomen: BS +, non-tender, non-distended Ext: no clubbing, cyanosis, or edema Psych: pleasant and cooperative   Neurological: alert- doesn't remember LBM.     Comments: left C7. STM deficits. Left inattention present. Dense left hemiparesis without change. RUE and RLE 4/5. Decreased LT Left side  ongoing. DTR's 3+ on left side. Early flexor tone MAS 1+ to 2/4 LUE and tr LLE.  Musc: pain at left wrist and thigh.   Assessment/Plan: 1. Functional deficits which require 3+ hours per day of interdisciplinary therapy in a comprehensive inpatient rehab setting. Physiatrist is providing close team supervision and 24 hour management of active medical problems listed below. Physiatrist and rehab team continue to assess barriers to discharge/monitor patient progress toward functional and medical goals  Care Tool:  Bathing    Body parts bathed by patient: Left  arm, Chest, Abdomen, Front perineal area, Left upper leg, Right upper leg, Face   Body parts bathed by helper: Right lower leg, Buttocks, Left lower leg     Bathing assist Assist Level: Maximal Assistance - Patient 24 - 49%     Upper Body Dressing/Undressing Upper body dressing   What is the patient wearing?: Pull over shirt    Upper body assist Assist Level: Maximal Assistance - Patient 25 - 49%    Lower Body Dressing/Undressing Lower body dressing      What is the patient wearing?: Pants     Lower body assist Assist for lower body dressing: Maximal Assistance - Patient 25 - 49%     Toileting Toileting    Toileting assist Assist for toileting: Total Assistance - Patient < 25%     Transfers Chair/bed transfer  Transfers assist     Chair/bed transfer assist level: Maximal Assistance - Patient 25 - 49%     Locomotion Ambulation   Ambulation assist   Ambulation activity did not occur: Safety/medical concerns  Assist level: 2 helpers Assistive device: Other (comment) (3 Musketeer) Max distance: 61ft   Walk 10 feet activity   Assist  Walk 10 feet activity did not occur: Safety/medical concerns  Assist level:  2 helpers Assistive device:  (3 musketeers)   Walk 50 feet activity   Assist Walk 50 feet with 2 turns activity did not occur: Safety/medical concerns         Walk 150 feet activity   Assist Walk 150 feet activity did not occur: Safety/medical concerns         Walk 10 feet on uneven surface  activity   Assist Walk 10 feet on uneven surfaces activity did not occur: Safety/medical concerns         Wheelchair     Assist Will patient use wheelchair at discharge?: Yes Type of Wheelchair: Manual    Wheelchair assist level: Dependent - Patient 0% Max wheelchair distance: 150    Wheelchair 50 feet with 2 turns activity    Assist        Assist Level: Dependent - Patient 0%   Wheelchair 150 feet activity      Assist      Assist Level: Dependent - Patient 0%   Blood pressure (!) 143/95, pulse (!) 106, temperature 99 F (37.2 C), temperature source Oral, resp. rate 17, height 5\' 5"  (1.651 m), weight 58.5 kg, SpO2 100 %.    Medical Problem List and Plan: 1.  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              -patient may shower             -ELOS/Goals: will have to extend stay into next week to accommodate set up at home  -Continue CIR therapies including PT, OT, and SLP  2.  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 Wife prefers no oral anticoagulation given hematoma.  -DVT/anticoagulation:  Mechanical:  Antiembolism stockings, knee (TED hose) Bilateral lower extremities             -antiplatelet therapy: N/A due to concerns of CAA 3. Left sided limb pain/low back pain: likely multifactorial and CT/exam/labs consistent with HO - continue lidocaine patch -increase gabapentin 200mg  tid arousal permitting -kpad for back -continue tizanidine for tone, 2mg  bid as tolerated -continue ROM, activities with therapy -7/21 pain inconsistent, behavioral component -Weaning steroid to off  -increased tizanidine to tid  -will increase gabapentin again given variable, diffuse nature of pain. This could just be neuropathic 4. Arousal:  improved  - ritalin  10mg  bid for arousal/initiaton  7/17- calm, but more awake- con't regimen             -antipsychotic agents: N/A 5. Neuropsych: This patient is not fully capable of making decisions on his own behalf. 6. Skin/Wound Care: Routine pressure relief measures. 7. Fluids/Electrolytes/Nutrition: monitor fluid intake while on PO alone  7/18     -K+ sl low---repleting   -albumin improving   -eating well 8. Recurrent aspiration event:    -unasyn started 6/20---stopped 6/30             -- Wbc's 5k             --CXR recently stable, lungs clear 9. Leucocytosis:  resolved 10. Blood pressure:  Vitals:   06/30/21 1945 07/01/21 0344  BP: 125/87 (!) 143/95  Pulse: (!) 105 (!) 106  Resp: 16 17  Temp: 98.6 F (37 C) 99 F (37.2 C)  SpO2: 100% 100%  Borderline control 7/21 11. Progressive cognitive decline: To reschedule dementia work up at [facility] after d/c. 12. Hyperglycemia: Hgb A1C- 5.8.             --  Monitor BS every  4 hours and use SSI for elevated  BS   CBG (last 3)  Recent Labs    06/30/21 0618 06/30/21 2051 07/01/21 0646  GLUCAP 97 150* 122*  Controlled 7/19.   13. Dysphagia: . D2/thins diet initiated!.              -megace trial VERY beneficial begin weaning. Has app for food again!    14. Tachycardia: 80's-90's - monitor HR TID 15. Elevated liver transaminases: Liver u/s with one small cystic structure.   -all LFT's nearly back wnl  -AP slightly elevated likely related to HO 16. Mild anemia-Ferritin elevated but Hgb, Fe++ and TIBC all low which point towards anemia of chronic disease.   17. Constipation  7/21 moved bowels after mg citrate     LOS: 27 days A FACE TO FACE EVALUATION WAS PERFORMED  Ranelle Oyster 07/01/2021, 9:29 AM

## 2021-07-01 NOTE — Plan of Care (Signed)
  Problem: RH Pre-functional/Other (Specify) Goal: RH LTG OT (Specify) 1 Description: RH LTG OT (Specify) 1 Flowsheets (Taken 07/01/2021 0800) LTG: Other OT (Specify) 1: Caregiver will demonstrate proficiency with STedy transfer   Problem: RH Pre-functional/Other (Specify) Goal: RH LTG OT (Specify) 2 Description: RH LTG OT (Specify) 2  Flowsheets (Taken 07/01/2021 0801) LTG: Other OT (Specify) 2: Caregiver will demonstrate L UE positioning and ROM techniques

## 2021-07-01 NOTE — Plan of Care (Deleted)
  Problem: RH Pre-functional/Other (Specify) Goal: RH LTG OT (Specify) 1 Description: RH LTG OT (Specify) 1 Flowsheets (Taken 07/01/2021 0800) LTG: Other OT (Specify) 1: Caregiver will demonstrate proficiency with [facility] transfer

## 2021-07-02 LAB — GLUCOSE, CAPILLARY: Glucose-Capillary: 90 mg/dL (ref 70–99)

## 2021-07-02 MED ORDER — TIZANIDINE HCL 2 MG PO TABS
2.0000 mg | ORAL_TABLET | Freq: Four times a day (QID) | ORAL | Status: DC
  Administered 2021-07-02: 2 mg via ORAL
  Filled 2021-07-02 (×3): qty 1

## 2021-07-02 NOTE — Progress Notes (Signed)
Pt spouse conveyed concerns over medication interactions that may be causing drowsiness, constipation, etc. Spouse requested concerns be conveyed to Provider and requested to speak with a Provider. Nurse encouraged spouse to be present tomorrow morning for rounds and that concerns will be documented.

## 2021-07-02 NOTE — Progress Notes (Signed)
Patient ID: Donald [name], male   DOB: [birth year], 65 y.o.   MRN: [id number]  Medical team will extend pt to Wednesday (7/27) to allow for more family edu.  SW met with pt wife in hallway to discuss above. Pt wife will f/u with SW about HHA preference as she did not have her list with her.   Donald [name], MSW, [place] Office: [phone] Cell: [phone] Fax: [phone]

## 2021-07-02 NOTE — Progress Notes (Signed)
Occupational Therapy Session Note  Patient Details  Name: Thedore Pickel MRN: [id number] Date of Birth: Jun 25, 1956  Today's Date: 07/02/2021 OT Individual Time: 0915-1008 OT Individual Time Calculation (min): 53 min    Short Term Goals: Week 4:  OT Short Term Goal 1 (Week 4): LTG=STG 2.2 ELOS   Skilled Therapeutic Interventions/Progress Updates:    Pt received supine with his wife present for family education session. Session focused on demonstrating bed level ADLs providing cues for caregiver body mechanics, recommended equipment, skin hygiene/integrity, LUE management and setting up a daily routine. Pt required mod assist for rolling R and max A to roll L. Pt frequently reporting pain in his LUE/LE with any tactile input. Pt with very low frustration/pain tolerance. Max A for ADLs overall. Pt was passed off to PT in room for next family edu session.   Therapy Documentation Precautions:  Precautions Precautions: Fall Precaution Comments: L hemiparesis, L inattention, pusher to the L, cognitive deficits, cortrak Restrictions Weight Bearing Restrictions: No   Therapy/Group: Individual Therapy  Crissie Reese 07/02/2021, 6:36 AM

## 2021-07-02 NOTE — Progress Notes (Signed)
Occupational Therapy Session Note  Patient Details  Name: Donald [name] MRN: [id number] Date of Birth: 1956/10/28  Today's Date: 07/02/2021 OT Individual Time: 1455-1550 OT Individual Time Calculation (min): 55 min    Short Term Goals: Week 3:  OT Short Term Goal 1 (Week 3): Patient will complete toilet transfer with max A of 1 OT Short Term Goal 1 - Progress (Week 3): Met OT Short Term Goal 2 (Week 3): Pt will recall hemi dressing techniques with min questioning cues OT Short Term Goal 2 - Progress (Week 3): Met OT Short Term Goal 3 (Week 3): Patient will complete 1 step of UB dressing task OT Short Term Goal 3 - Progress (Week 3): Met  Skilled Therapeutic Interventions/Progress Updates:     Pt received in bed with unrated, hypersensitivity with L hemi body movement. Slow controlled movements performed to reducae pain and resposition at end of session for comfort  ADL:  Pt requires total A overall and signficantly increased time for L attention for rolling and manage Les during threading pants/rolling to advance pants past hips. Pt requires total A to don socks and sit up to EOB with MAX A. MAX A squat pivot transfer with poor anterior weight shift d/t pain gaurding noted. Grooming with items on L for L attention with VC/A for termination of tasks. Significanlty impaired initiation and processing speed impacting ADL performance.   Therapeutic activity Family training with stedy with son, Inocente Salles, and wife. Pt completes STS in stedy with OT to demo body mechanics and safety features. Able to return demo with son and wife with min cuing.   Pt left at end of session in bed with exit alarm on, call light in reach and all needs met   Therapy Documentation Precautions:  Precautions Precautions: Fall Precaution Comments: L hemiparesis, L inattention, pusher to the L, cognitive deficits, cortrak Restrictions Weight Bearing Restrictions: No General:   Vital Signs: Therapy  Vitals Temp: 98.9 F (37.2 C) Pulse Rate: 93 Resp: 17 BP: (!) 156/85 Patient Position (if appropriate): Lying Oxygen Therapy SpO2: 100 % O2 Device: Room Air Pain:   ADL: ADL Eating: NPO Grooming: Dependent Upper Body Bathing: Dependent Where Assessed-Upper Body Bathing: Bed level Lower Body Bathing: Dependent Where Assessed-Lower Body Bathing: Bed level Upper Body Dressing: Dependent Where Assessed-Upper Body Dressing: Bed level Lower Body Dressing: Dependent Where Assessed-Lower Body Dressing: Bed level Toileting: Dependent Where Assessed-Toileting: Bed level Toilet Transfer: Not assessed Tub/Shower Transfer: Not assessed Gaffer Transfer: Not assessed Vision   Perception    Praxis   Exercises:   Other Treatments:     Therapy/Group: Individual Therapy  Tonny Branch 07/02/2021, 6:46 AM

## 2021-07-02 NOTE — Progress Notes (Signed)
PROGRESS NOTE   Subjective/Complaints:  Pt slept 4-5 hours. Pain sometimes woke him. Received tylenol with relief.   ROS: Limited due to cognitive/behavioral    Objective:   No results found. No results for input(s): WBC, HGB, HCT, PLT in the last 72 hours.    No results for input(s): NA, K, CL, CO2, GLUCOSE, BUN, CREATININE, CALCIUM in the last 72 hours.      Intake/Output Summary (Last 24 hours) at 07/02/2021 1122 Last data filed at 07/02/2021 0757 Gross per 24 hour  Intake 440 ml  Output --  Net 440 ml         Physical Exam: Vital Signs Blood pressure (!) 156/85, pulse 93, temperature 98.9 F (37.2 C), resp. rate 17, height 5\' 5"  (1.651 m), weight 58.2 kg, SpO2 100 %.  Constitutional: No distress . Vital signs reviewed. HEENT: EOMI, oral membranes moist Neck: supple Cardiovascular: RRR without murmur. No JVD    Respiratory/Chest: CTA Bilaterally without wheezes or rales. Normal effort    GI/Abdomen: BS +, non-tender, non-distended Ext: no clubbing, cyanosis, or edema Psych: pleasant and cooperative  Neurological: alert- doesn't remember LBM.     Comments: left C7. STM deficits. Left inattention present. Dense left hemiparesis without change. RUE and RLE 4/5. Decreased LT Left side  ongoing. DTR's 3+ on left side. Early flexor tone MAS 1+ to 2/4 LUE and tr LLE.  Musc: pain at left wrist and thigh. Left shoulder tender/tight with ROM  Assessment/Plan: 1. Functional deficits which require 3+ hours per day of interdisciplinary therapy in a comprehensive inpatient rehab setting. Physiatrist is providing close team supervision and 24 hour management of active medical problems listed below. Physiatrist and rehab team continue to assess barriers to discharge/monitor patient progress toward functional and medical goals  Care Tool:  Bathing    Body parts bathed by patient: Chest, Abdomen, Face   Body parts  bathed by helper: Right arm, Left arm, Front perineal area, Buttocks, Right upper leg, Left upper leg, Right lower leg, Left lower leg     Bathing assist Assist Level: Maximal Assistance - Patient 24 - 49%     Upper Body Dressing/Undressing Upper body dressing   What is the patient wearing?: Pull over shirt    Upper body assist Assist Level: Maximal Assistance - Patient 25 - 49%    Lower Body Dressing/Undressing Lower body dressing      What is the patient wearing?: Pants, Incontinence brief     Lower body assist Assist for lower body dressing: Maximal Assistance - Patient 25 - 49%     Toileting Toileting    Toileting assist Assist for toileting: Total Assistance - Patient < 25%     Transfers Chair/bed transfer  Transfers assist     Chair/bed transfer assist level: Maximal Assistance - Patient 25 - 49%     Locomotion Ambulation   Ambulation assist   Ambulation activity did not occur: Safety/medical concerns  Assist level: 2 helpers Assistive device: Other (comment) (3 Musketeer) Max distance: 45ft   Walk 10 feet activity   Assist  Walk 10 feet activity did not occur: Safety/medical concerns  Assist level: 2 helpers Assistive device:  (3  musketeers)   Walk 50 feet activity   Assist Walk 50 feet with 2 turns activity did not occur: Safety/medical concerns         Walk 150 feet activity   Assist Walk 150 feet activity did not occur: Safety/medical concerns         Walk 10 feet on uneven surface  activity   Assist Walk 10 feet on uneven surfaces activity did not occur: Safety/medical concerns         Wheelchair     Assist Will patient use wheelchair at discharge?: Yes Type of Wheelchair: Manual    Wheelchair assist level: Dependent - Patient 0% Max wheelchair distance: 150    Wheelchair 50 feet with 2 turns activity    Assist        Assist Level: Dependent - Patient 0%   Wheelchair 150 feet activity      Assist      Assist Level: Dependent - Patient 0%   Blood pressure (!) 156/85, pulse 93, temperature 98.9 F (37.2 C), resp. rate 17, height 5\' 5"  (1.651 m), weight 58.2 kg, SpO2 100 %.    Medical Problem List and Plan: 1.  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              -patient may shower             -ELOS/Goals: have extended stay to 7/72 to accommodate set up at home  -Continue CIR therapies including PT, OT, and SLP  2.  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 Wife prefers no oral anticoagulation given hematoma.  -DVT/anticoagulation:  Mechanical:  Antiembolism stockings, knee (TED hose) Bilateral lower extremities             -antiplatelet therapy: N/A due to concerns of CAA 3. Low back pain, left hemiplegic shoulder, ?HO left hip, spasticity/neuropathic pain:   - continue lidocaine patch -increased gabapentin 200mg  tid- monitor for tolerance -kpad for back, tylenol prn -increase tizanidine to 2mg  QID -continue ROM, activities with therapy -weaning prednisone to off -pain has been variable in location, sometimes affecting right side 4. Arousal:  improved  - ritalin  10mg  bid for arousal/initiaton             -antipsychotic agents: N/A 5. Neuropsych: This patient is not fully capable of making decisions on his own behalf. 6. Skin/Wound Care: Routine pressure relief measures. 7. Fluids/Electrolytes/Nutrition: monitor fluid intake while on PO alone  7/18     -K+ sl low---repleting   -albumin improving   -eating well 8. Recurrent aspiration event:    -unasyn started 6/20---stopped 6/30             -- Wbc's 5k             --CXR recently stable, lungs clear 10. Blood pressure:  Vitals:   07/01/21 1926 07/02/21 0523  BP: 113/82 (!) 156/85  Pulse: (!) 101 93  Resp: 18 17  Temp: 97.6 F (36.4 C) 98.9 F (37.2 C)  SpO2: 100% 100%  Borderline control 7/22---increase tizanidine as  above 11. Progressive cognitive decline: To reschedule dementia work up at [facility] after d/c. 12. Hyperglycemia: Hgb A1C- 5.8.             --Monitor BS every  4 hours and use SSI for elevated  BS   CBG (last 3)  Recent Labs    07/01/21 0646 07/01/21 2054 07/02/21 0624  GLUCAP 122* 168*  90  Controlled 7/19.   13. Dysphagia: . D2/thins diet initiated!.              -megace trial VERY beneficial begin weaning. Has app for food again!    14. Tachycardia: 80's-90's - monitor HR TID 15. Elevated liver transaminases: Liver u/s with one small cystic structure.   -all LFT's nearly back wnl  -AP slightly elevated likely related to HO 16. Mild anemia-Ferritin elevated but Hgb, Fe++ and TIBC all low which point towards anemia of chronic disease.   17. Constipation  7/21 moved bowels after mg citrate     LOS: 28 days A FACE TO FACE EVALUATION WAS PERFORMED  Ranelle Oyster 07/02/2021, 11:22 AM

## 2021-07-02 NOTE — Progress Notes (Signed)
Patient slept for 4 hours despite prn sleep aide administered. Tylenol 650 mg given at hs for left shoulder pain-effective. Pt able to tolerate resting hand splint and prafo boot throughout the night.

## 2021-07-02 NOTE — Progress Notes (Signed)
Physical Therapy Weekly Progress Note  Patient Details  Name: Donald [name] MRN: [id number] Date of Birth: January 12, 1956  Beginning of progress report period: June 25, 2021 End of progress report period: July 02, 2021  Today's Date: 07/02/2021 PT Individual Time: 1009-1107 PT Individual Time Calculation (min): 58 min   Patient has met 0 out of 0 short term goals due to goals not being set as pt was expected to DC 7/22 and was extended to 7/27.  Pt has progressed very slowly toward mobility goals, demonstrating little improvement in L hemibody motor function, attention, balance, or transfers. Pt has not been able to progress to functional ambulation due to deficits and has remained at essentially a maxA level. When pt presents with increased energy and alertness, he can require as little as modA for bed mobility and transfers, but frequently pt requires totalA due to deficit in initiation as well as complaint of L sided pain with any movement. Pt's wife has attended multiple family education sessions and has performed well with learning use of Stedy for transfers and providing safe assistance for pt mobility.  Patient continues to demonstrate the following deficits muscle weakness, decreased cardiorespiratoy endurance, abnormal tone, unbalanced muscle activation, motor apraxia, decreased coordination, and decreased motor planning, decreased attention to left, decreased initiation, decreased attention, decreased awareness, decreased problem solving, decreased safety awareness, decreased memory, and delayed processing, and decreased sitting balance, decreased standing balance, decreased postural control, hemiplegia, and decreased balance strategies and therefore will continue to benefit from skilled PT intervention to increase functional independence with mobility.  Patient not progressing toward long term goals.  See goal revision..  Plan of care revisions: Goals being downgraded to reflect lack of  progress.  PT Short Term Goals Week 4:  PT Short Term Goal 1 (Week 4): STGs = LTGs Week 5:  PT Short Term Goal 1 (Week 5): STGs=LTGs  Skilled Therapeutic Interventions/Progress Updates:     Pt received supine in bed and agrees to therapy. Wife present for family education. Pt reports pain in L leg and low back during session. PT provides rest breaks and repositioning to manage pain. PT demonstrates handling of pt during bed mobility for wife. Pt given time to attempt to assist with bed mobility but has very poor initiation, eventually requiring maxA +1 to complete. Stand pivot to WC with maxA +1. WC transport to gym for time management. PT demos car transfer, providing maxA +1 for squat pivot to the L. Pt does not fully get buttocks on seat and requires totalA to lift hips up onto carseat for safe transfer. MaxA for stand pivot back to WC. PT provides additional cueing for pt to help with sequencing transfer, and attempts again with stand pivot technique, again requiring maxA. Pt's son arrives for family education. Pt transported downstairs for actual car transfer in wife' [employer]. PT uses slideboard and demonstrates transfer, with pt requiring maxA and having LOB to the L laying sideways on seat of car. Pt complaining of significant pain in L leg and requiring totalA to return to [facility]. Following car transfer, wife verbalizes that she feels it will be safer to have medical transportation take pt home.  WC transport back inside. Squat pivot back to bed with maxA +1. Sit to supine with totalA +1. Left with alarm intact and all needs within reach.  Therapy Documentation Precautions:  Precautions Precautions: Fall Precaution Comments: L hemiparesis, L inattention, pusher to the L, cognitive deficits, cortrak Restrictions Weight Bearing Restrictions: No  Therapy/Group: Individual Therapy  [name], PT, DPT 07/02/2021, 12:56 PM

## 2021-07-03 DIAGNOSIS — S06340S Traumatic hemorrhage of right cerebrum without loss of consciousness, sequela: Secondary | ICD-10-CM

## 2021-07-03 LAB — GLUCOSE, CAPILLARY
Glucose-Capillary: 116 mg/dL — ABNORMAL HIGH (ref 70–99)
Glucose-Capillary: 121 mg/dL — ABNORMAL HIGH (ref 70–99)
Glucose-Capillary: 104 mg/dL — ABNORMAL HIGH (ref 70–99)

## 2021-07-03 MED ORDER — GABAPENTIN 800 MG PO TABS
400.0000 mg | ORAL_TABLET | Freq: Every day | ORAL | Status: DC
  Filled 2021-07-03: qty 0.5

## 2021-07-03 MED ORDER — GABAPENTIN 400 MG PO CAPS
400.0000 mg | ORAL_CAPSULE | Freq: Every day | ORAL | Status: DC
  Administered 2021-07-03 – 2021-07-10 (×7): 400 mg via ORAL
  Filled 2021-07-03 (×8): qty 1

## 2021-07-03 NOTE — Progress Notes (Signed)
Pt and family continues to decline Pro[employer] and Boost supplements for additional nutrition.

## 2021-07-03 NOTE — Progress Notes (Signed)
PROGRESS NOTE   Subjective/Complaints: Wife at bedside concerns about somnolence related to his medications.  Wife/patient refused gabapentin and tizanidine this AM.  Discussed pain interfering with therapy as well as sleep.  Some problem with spasms as well.  A bowel movement was recorded this morning. Other concern is constipation, no abdominal pain no nausea or vomiting. Discussed side effects versus benefits of medications and the goal of increasing therapy participation  ROS: Limited due to cognitive/behavioral    Objective:   No results found. No results for input(s): WBC, HGB, HCT, PLT in the last 72 hours.    No results for input(s): NA, K, CL, CO2, GLUCOSE, BUN, CREATININE, CALCIUM in the last 72 hours.      Intake/Output Summary (Last 24 hours) at 07/03/2021 1111 Last data filed at 07/03/2021 0838 Gross per 24 hour  Intake 358 ml  Output --  Net 358 ml          Physical Exam: Vital Signs Blood pressure 131/83, pulse 98, temperature 98.3 F (36.8 C), temperature source Oral, resp. rate 15, height 5\' 5"  (1.651 m), weight 59.1 kg, SpO2 100 %.    General: No acute distress Mood and affect are appropriate Heart: Regular rate and rhythm no rubs murmurs or extra sounds Lungs: Clear to auscultation, breathing unlabored, no rales or wheezes Abdomen: Positive bowel sounds, soft nontender to palpation, nondistended Extremities: No clubbing, cyanosis, or edema Skin: No evidence of breakdown, no evidence of rash MSK: No evidence of knee effusion there is some tenderness palpation pretibial but no swelling, pain to palpation over the foot and ankle area but no ankle or foot swelling.  No pain with ankle or foot or knee range of motion there is some pain with hip range of motion Neurological: alert- doesn't remember LBM.     Comments: left C7. STM deficits. Left inattention present. Dense left hemiparesis without  change. RUE and RLE 4/5. Decreased LT Left side  ongoing. DTR's 3+ on left side.Musc: pain at left wrist and thigh. Left shoulder tender/tight with ROM Tone: MAS 3 in the left bicep and finger flexors, no appreciable tone in the left lower limb detected. Assessment/Plan: 1. Functional deficits which require 3+ hours per day of interdisciplinary therapy in a comprehensive inpatient rehab setting. Physiatrist is providing close team supervision and 24 hour management of active medical problems listed below. Physiatrist and rehab team continue to assess barriers to discharge/monitor patient progress toward functional and medical goals  Care Tool:  Bathing    Body parts bathed by patient: Chest, Abdomen, Face   Body parts bathed by helper: Right arm, Left arm, Front perineal area, Buttocks, Right upper leg, Left upper leg, Right lower leg, Left lower leg     Bathing assist Assist Level: Maximal Assistance - Patient 24 - 49%     Upper Body Dressing/Undressing Upper body dressing   What is the patient wearing?: Pull over shirt    Upper body assist Assist Level: Maximal Assistance - Patient 25 - 49%    Lower Body Dressing/Undressing Lower body dressing      What is the patient wearing?: Pants, Incontinence brief     Lower body assist  Assist for lower body dressing: Maximal Assistance - Patient 25 - 49%     Toileting Toileting    Toileting assist Assist for toileting: Total Assistance - Patient < 25%     Transfers Chair/bed transfer  Transfers assist     Chair/bed transfer assist level: Maximal Assistance - Patient 25 - 49%     Locomotion Ambulation   Ambulation assist   Ambulation activity did not occur: Safety/medical concerns  Assist level: 2 helpers Assistive device: Other (comment) (3 Musketeer) Max distance: 62ft   Walk 10 feet activity   Assist  Walk 10 feet activity did not occur: Safety/medical concerns  Assist level: 2 helpers Assistive device:   (3 musketeers)   Walk 50 feet activity   Assist Walk 50 feet with 2 turns activity did not occur: Safety/medical concerns         Walk 150 feet activity   Assist Walk 150 feet activity did not occur: Safety/medical concerns         Walk 10 feet on uneven surface  activity   Assist Walk 10 feet on uneven surfaces activity did not occur: Safety/medical concerns         Wheelchair     Assist Will patient use wheelchair at discharge?: Yes Type of Wheelchair: Manual    Wheelchair assist level: Dependent - Patient 0% Max wheelchair distance: 150    Wheelchair 50 feet with 2 turns activity    Assist        Assist Level: Dependent - Patient 0%   Wheelchair 150 feet activity     Assist      Assist Level: Dependent - Patient 0%   Blood pressure 131/83, pulse 98, temperature 98.3 F (36.8 C), temperature source Oral, resp. rate 15, height 5\' 5"  (1.651 m), weight 59.1 kg, SpO2 100 %.    Medical Problem List and Plan: 1.  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              -patient may shower             -ELOS/Goals: have extended stay to 7/72 to accommodate set up at home  -Continue CIR therapies including PT, OT, and SLP  2.  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 Wife prefers no oral anticoagulation given hematoma.  -DVT/anticoagulation:  Mechanical:  Antiembolism stockings, knee (TED hose) Bilateral lower extremities             -antiplatelet therapy: N/A due to concerns of CAA 3. Low back pain, left hemiplegic shoulder, ?HO left hip, spasticity/neuropathic pain:   - continue lidocaine patch -increased gabapentin 200mg  tid- monitor for tolerance -kpad for back, tylenol prn -increase tizanidine to 2mg  QID -continue ROM, activities with therapy -weaning prednisone to off -pain has been variable in location, sometimes affecting right side We discussed pain versus  spasticity and appears that pain is a more limiting factor for him.  Wife is concerned that both gabapentin and tizanidine may be interacting to cause excess drowsiness.  Will change to gabapentin 400 mg nightly and DC tizanidine for now 4. Arousal:  improved  - ritalin  10mg  bid for arousal/initiaton             -antipsychotic agents: N/A 5. Neuropsych: This patient is not fully capable of making decisions on his own behalf. 6. Skin/Wound Care: Routine pressure relief measures. 7. Fluids/Electrolytes/Nutrition: monitor fluid intake while on PO alone  7/18     -  K+ sl low---repleting   -albumin improving   -eating well 8. Recurrent aspiration event:    -unasyn started 6/20---stopped 6/30             -- Wbc's 5k             --CXR recently stable, lungs clear 10. Blood pressure:  Marland Kitchen Vitals:   07/02/21 2020 07/03/21 0403  BP: (!) 132/95 131/83  Pulse: (!) 102 98  Resp: 17 15  Temp: 98.1 F (36.7 C) 98.3 F (36.8 C)  SpO2: 100% 100%  Borderline control 7/22---increase tizanidine as above 11. Progressive cognitive decline: To reschedule dementia work up at [facility] after d/c. 12. Hyperglycemia: Hgb A1C- 5.8.             --Monitor BS every  4 hours and use SSI for elevated  BS   CBG (last 3)  Recent Labs    07/01/21 0646 07/01/21 2054 07/02/21 0624  GLUCAP 122* 168* 90   Controlled 7/23.   13. Dysphagia: . D2/thins diet initiated!.              -megace trial VERY beneficial begin weaning. Has app for food again!    14. Tachycardia: 80's-90's - monitor HR TID 15. Elevated liver transaminases: Liver u/s with one small cystic structure.   -all LFT's nearly back wnl  -AP slightly elevated likely related to HO 16. Mild anemia-Ferritin elevated but Hgb, Fe++ and TIBC all low which point towards anemia of chronic disease.   17. Constipation  7/21 moved bowels after mg citrate, has had bowel movements daily since that time, we will continue to monitor for now continue twice daily  MiraLAX.     LOS: 29 days A FACE TO FACE EVALUATION WAS PERFORMED  Erick Colace 07/03/2021, 11:11 AM

## 2021-07-03 NOTE — Progress Notes (Signed)
Pt spouse refused gabapentin and zanaflex for pt this am

## 2021-07-03 NOTE — Progress Notes (Signed)
Physical Therapy Session Note  Patient Details  Name: Donald [name] MRN: [id number] Date of Birth: [birth year]  Today's Date: 07/03/2021 PT Individual Time: 5726-2035 PT Individual Time Calculation (min): 46 min   Short Term Goals: Week 5:  PT Short Term Goal 1 (Week 5): STGs=LTGs  Skilled Therapeutic Interventions/Progress Updates:    Patient in supine in room with wife present.  Patient reports he has been up walking and is tired.  Supine to don shorts with total A pt rolling with max A, max cues and increased time to complete donning.  Patient up to sit EOB max A.  Performed sit to stand reaching for opposite armrest on tilt in space w/c with mod A, but unable to pivot so +2 A for placing hips into chair.  Patient assisted in w/c to dayroom.  Performed over the back pivot transfer to mat with +2 A for safety.  Patient seated EOM for manual trunk rotation with head rotation for visualizing object or therapist to R or L.  Facilitation for rib movement with rotation and for lateral leans seated EOM.  Patient requesting to toilet.  Assisted with over the back transfer to tilt in space w/c.  Patient assisted to room and performed sit to stand holding bed rail with mod to max A.  Total A to doff pants/briefs and +2 A to remove w/c and place 3:1.  Patient positioned on 3:1.  Patient sit to stand again reaching bed rail with max A.  Patient standing with A for hygiene and donning brief and shorts.  +2 A to remove 3:1 and place w/c.  Patient positioned for comfort and tilted in w/c.  Left with alarm belt on and call bell in reach.   Therapy Documentation Precautions:  Precautions Precautions: Fall Precaution Comments: L hemiparesis, L inattention, pusher to the L, cognitive deficits, cortrak Restrictions Weight Bearing Restrictions: No  Pain: Pain Assessment Faces Pain Scale: Hurts even more Pain Type: Acute pain Pain Location: Leg Pain Orientation: Left Pain Descriptors / Indicators:  Grimacing;Guarding Pain Onset: With Activity Pain Intervention(s): Repositioned;Massage (massage wtih muscle rub)    Therapy/Group: Individual Therapy  Elray Mcgregor Sheran Lawless, PT 07/03/2021, 5:44 PM

## 2021-07-04 LAB — GLUCOSE, CAPILLARY
Glucose-Capillary: 96 mg/dL (ref 70–99)
Glucose-Capillary: 115 mg/dL — ABNORMAL HIGH (ref 70–99)

## 2021-07-04 NOTE — Progress Notes (Addendum)
Patient with very poor appetite. Eating less than 5% of meals. Patient reports feeling "full" Suppository given this shift. Patient transferred to [facility] and was able to have small soft bowel movement. Rectal vault still with some soft stool present. Patient not allowing nurse to do any dig stim or attempt removal. Encouraging fluids. Continue reinforcing education.  1855- Enema administered. Will report off to oncoming shift.

## 2021-07-04 NOTE — Progress Notes (Signed)
PROGRESS NOTE   Subjective/Complaints: Patient is awake and alert this morning no complaints of pain.  He states that the left wrist splint gets hot when it is across his chest and abdomen area.  Reposition the patient with a pillow and he states that was more comfortable  ROS: Limited due to cognitive/behavioral    Objective:   No results found. No results for input(s): WBC, HGB, HCT, PLT in the last 72 hours.    No results for input(s): NA, K, CL, CO2, GLUCOSE, BUN, CREATININE, CALCIUM in the last 72 hours.      Intake/Output Summary (Last 24 hours) at 07/04/2021 1045 Last data filed at 07/04/2021 0804 Gross per 24 hour  Intake 110 ml  Output --  Net 110 ml          Physical Exam: Vital Signs Blood pressure (!) 126/98, pulse (!) 109, temperature 99 F (37.2 C), temperature source Oral, resp. rate 18, height 5\' 5"  (1.651 m), weight 58.7 kg, SpO2 100 %.    General: No acute distress Mood and affect are appropriate Heart: Regular rate and rhythm no rubs murmurs or extra sounds Lungs: Clear to auscultation, breathing unlabored, no rales or wheezes Abdomen: Positive bowel sounds, soft nontender to palpation, nondistended Extremities: No clubbing, cyanosis, or edema Skin: No evidence of breakdown, no evidence of rash MSK: No evidence of knee effusion there is some tenderness palpation pretibial but no swelling, pain to palpation over the foot and ankle area but no ankle or foot swelling.  No pain with ankle or foot or knee range of motion there is some pain with hip range of motion Neurological: alert- doesn't remember LBM.     Comments: left C7. STM deficits. Left inattention present. Dense left hemiparesis without change. RUE and RLE 4/5. Decreased LT Left side  ongoing. DTR's 3+ on left side.Musc: pain at left wrist and thigh. Left shoulder tender/tight with ROM Tone: MAS 3 in the left bicep and finger flexors,  no appreciable tone in the left lower limb detected. Assessment/Plan: 1. Functional deficits which require 3+ hours per day of interdisciplinary therapy in a comprehensive inpatient rehab setting. Physiatrist is providing close team supervision and 24 hour management of active medical problems listed below. Physiatrist and rehab team continue to assess barriers to discharge/monitor patient progress toward functional and medical goals  Care Tool:  Bathing    Body parts bathed by patient: Chest, Abdomen, Face   Body parts bathed by helper: Right arm, Left arm, Front perineal area, Buttocks, Right upper leg, Left upper leg, Right lower leg, Left lower leg     Bathing assist Assist Level: Maximal Assistance - Patient 24 - 49%     Upper Body Dressing/Undressing Upper body dressing   What is the patient wearing?: Pull over shirt    Upper body assist Assist Level: Maximal Assistance - Patient 25 - 49%    Lower Body Dressing/Undressing Lower body dressing      What is the patient wearing?: Pants, Incontinence brief     Lower body assist Assist for lower body dressing: Maximal Assistance - Patient 25 - 49%     Toileting Toileting    Toileting  assist Assist for toileting: Total Assistance - Patient < 25%     Transfers Chair/bed transfer  Transfers assist     Chair/bed transfer assist level: Maximal Assistance - Patient 25 - 49%     Locomotion Ambulation   Ambulation assist   Ambulation activity did not occur: Safety/medical concerns  Assist level: 2 helpers Assistive device: Other (comment) (3 Musketeer) Max distance: 13ft   Walk 10 feet activity   Assist  Walk 10 feet activity did not occur: Safety/medical concerns  Assist level: 2 helpers Assistive device:  (3 musketeers)   Walk 50 feet activity   Assist Walk 50 feet with 2 turns activity did not occur: Safety/medical concerns         Walk 150 feet activity   Assist Walk 150 feet activity did  not occur: Safety/medical concerns         Walk 10 feet on uneven surface  activity   Assist Walk 10 feet on uneven surfaces activity did not occur: Safety/medical concerns         Wheelchair     Assist Will patient use wheelchair at discharge?: Yes Type of Wheelchair: Manual    Wheelchair assist level: Dependent - Patient 0% Max wheelchair distance: 150    Wheelchair 50 feet with 2 turns activity    Assist        Assist Level: Dependent - Patient 0%   Wheelchair 150 feet activity     Assist      Assist Level: Dependent - Patient 0%   Blood pressure (!) 126/98, pulse (!) 109, temperature 99 F (37.2 C), temperature source Oral, resp. rate 18, height 5\' 5"  (1.651 m), weight 58.7 kg, SpO2 100 %.    Medical Problem List and Plan: 1.  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              -patient may shower             -ELOS/Goals: have extended stay to 7/72 to accommodate set up at home  -Continue CIR therapies including PT, OT, and SLP  2.  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 Wife prefers no oral anticoagulation given hematoma.  -DVT/anticoagulation:  Mechanical:  Antiembolism stockings, knee (TED hose) Bilateral lower extremities             -antiplatelet therapy: N/A due to concerns of CAA 3. Low back pain, left hemiplegic shoulder, ?HO left hip, spasticity/neuropathic pain:   - continue lidocaine patch -increased gabapentin 200mg  tid-changed to 400 mg nightly on 7/23 patient has no significant pain 724 -kpad for back, tylenol prn -increase tizanidine to 2mg  QID, stopped due to wife request -continue ROM, activities with therapy -weaning prednisone to off -pain has been variable in location, sometimes affecting right side We discussed pain versus spasticity and appears that pain is a more limiting factor for him.  Wife is concerned that both gabapentin and  tizanidine may be interacting to cause excess drowsiness.  Will change to gabapentin 400 mg nightly and DC tizanidine for now 4. Arousal:  improved  - ritalin  10mg  bid for arousal/initiaton             -antipsychotic agents: N/A 5. Neuropsych: This patient is not fully capable of making decisions on his own behalf. 6. Skin/Wound Care: Routine pressure relief measures. 7. Fluids/Electrolytes/Nutrition: monitor fluid intake while on PO alone  7/18     -K+ sl low---repleting   -albumin  improving   -eating well 8. Recurrent aspiration event:    -unasyn started 6/20---stopped 6/30             -- Wbc's 5k             --CXR recently stable, lungs clear 10. Blood pressure:  Marland Kitchen Vitals:   07/03/21 1912 07/04/21 0312  BP: (!) 147/90 (!) 126/98  Pulse: (!) 105 (!) 109  Resp: 18 18  Temp: 97.8 F (36.6 C) 99 F (37.2 C)  SpO2: 100% 100%  Borderline elevation, mild tachycardia, consider beta-blocker 11. Progressive cognitive decline: To reschedule dementia work up at [facility] after d/c. 12. Hyperglycemia: Hgb A1C- 5.8.             --Monitor BS every  4 hours and use SSI for elevated  BS   CBG (last 3)  Recent Labs    07/03/21 1636 07/03/21 2121 07/04/21 0604  GLUCAP 121* 104* 96   Controlled 7/23.   13. Dysphagia: . D2/thins diet initiated!.              -megace trial VERY beneficial begin weaning. Has app for food again!    14. Tachycardia: 80's-90's - monitor HR TID 15. Elevated liver transaminases: Liver u/s with one small cystic structure.   -all LFT's nearly back wnl  -AP slightly elevated likely related to HO 16. Mild anemia-Ferritin elevated but Hgb, Fe++ and TIBC all low which point towards anemia of chronic disease.   17. Constipation  7/21 moved bowels after mg citrate, has had bowel movements daily since that time, we will continue to monitor for now continue twice daily MiraLAX.     LOS: 30 days A FACE TO FACE EVALUATION WAS PERFORMED  Erick Colace 07/04/2021, 10:45 AM

## 2021-07-04 NOTE — Plan of Care (Signed)
  Problem: RH BLADDER ELIMINATION Goal: RH STG MANAGE BLADDER WITH ASSISTANCE Description: STG Manage Bladder With min Assistance 07/04/2021 1112 by Yolonda Kida, RN Outcome: Not Progressing 07/04/2021 1112 by Yolonda Kida, RN Outcome: Progressing   Problem: RH KNOWLEDGE DEFICIT Goal: RH STG INCREASE KNOWLEDGE OF HYPERTENSION Description: Pt and family will be able to demonstrate understanding of medication management, dietary and lifestyle modifications to better control blood pressure and prevent stroke using handouts and booklets provided with mod I assist.  07/04/2021 1112 by Yolonda Kida, RN Outcome: Not Progressing 07/04/2021 1112 by Yolonda Kida, RN Outcome: Progressing Goal: RH STG INCREASE KNOWLEGDE OF HYPERLIPIDEMIA Description: Pt and family will be able to demonstrate understanding of medication management, dietary and lifestyle modifications to better control cholesterol levels and prevent stroke using handouts and booklets provided with mod I assist.  07/04/2021 1112 by Yolonda Kida, RN Outcome: Not Progressing 07/04/2021 1112 by Yolonda Kida, RN Outcome: Progressing Goal: RH STG INCREASE KNOWLEDGE OF STROKE PROPHYLAXIS Description: Pt and family will be able to demonstrate understanding of medication management, dietary and lifestyle modifications to better control blood pressure and prevent stroke using handouts and booklets provided with mod I assist.  07/04/2021 1112 by Yolonda Kida, RN Outcome: Not Progressing 07/04/2021 1112 by Yolonda Kida, RN Outcome: Progressing

## 2021-07-05 ENCOUNTER — Inpatient Hospital Stay (HOSPITAL_COMMUNITY): Payer: No Typology Code available for payment source

## 2021-07-05 LAB — URINALYSIS, COMPLETE (UACMP) WITH MICROSCOPIC
Bilirubin Urine: NEGATIVE
Glucose, UA: NEGATIVE mg/dL
Ketones, ur: 5 mg/dL — AB
Leukocytes,Ua: NEGATIVE
Nitrite: NEGATIVE
Protein, ur: 30 mg/dL — AB
Specific Gravity, Urine: 1.029 (ref 1.005–1.030)
pH: 5 (ref 5.0–8.0)

## 2021-07-05 LAB — BASIC METABOLIC PANEL
Anion gap: 14 (ref 5–15)
BUN: 24 mg/dL — ABNORMAL HIGH (ref 8–23)
CO2: 20 mmol/L — ABNORMAL LOW (ref 22–32)
Calcium: 10.4 mg/dL — ABNORMAL HIGH (ref 8.9–10.3)
Chloride: 96 mmol/L — ABNORMAL LOW (ref 98–111)
Creatinine, Ser: 0.93 mg/dL (ref 0.61–1.24)
GFR, Estimated: >60 mL/min (ref >60)
Glucose, Bld: 120 mg/dL — ABNORMAL HIGH (ref 70–99)
Potassium: 4.5 mmol/L (ref 3.5–5.1)
Sodium: 130 mmol/L — ABNORMAL LOW (ref 135–145)

## 2021-07-05 LAB — CBC
HCT: 46.2 % (ref 39.0–52.0)
Hemoglobin: 15.1 g/dL (ref 13.0–17.0)
MCH: 30.9 pg (ref 26.0–34.0)
MCHC: 32.7 g/dL (ref 30.0–36.0)
MCV: 94.5 fL (ref 80.0–100.0)
Platelets: 226 10*3/uL (ref 150–400)
RBC: 4.89 MIL/uL (ref 4.22–5.81)
RDW: 14.6 % (ref 11.5–15.5)
WBC: 12.9 10*3/uL — ABNORMAL HIGH (ref 4.0–10.5)
nRBC: 0 % (ref 0.0–0.2)

## 2021-07-05 LAB — PREALBUMIN: Prealbumin: 22.1 mg/dL (ref 18–38)

## 2021-07-05 LAB — GLUCOSE, CAPILLARY: Glucose-Capillary: 107 mg/dL — ABNORMAL HIGH (ref 70–99)

## 2021-07-05 MED ORDER — MEGESTROL ACETATE 400 MG/10ML PO SUSP
400.0000 mg | Freq: Two times a day (BID) | ORAL | Status: DC
  Administered 2021-07-05: 400 mg via ORAL
  Filled 2021-07-05: qty 10

## 2021-07-05 MED ORDER — CEPHALEXIN 250 MG PO CAPS: 500.0000 mg | ORAL_CAPSULE | Freq: Three times a day (TID) | ORAL | Status: DC

## 2021-07-05 MED ORDER — SODIUM CHLORIDE 0.9 % IV SOLN: INTRAVENOUS | Status: DC

## 2021-07-05 MED ORDER — MEGESTROL ACETATE 400 MG/10ML PO SUSP
400.0000 mg | Freq: Two times a day (BID) | ORAL | Status: DC
  Administered 2021-07-05 – 2021-07-11 (×9): 400 mg via ORAL
  Filled 2021-07-05 (×11): qty 10

## 2021-07-05 MED ORDER — SODIUM CHLORIDE 0.9 % IV SOLN
1.0000 g | INTRAVENOUS | Status: DC
  Administered 2021-07-05 – 2021-07-07 (×3): 1 g via INTRAVENOUS
  Filled 2021-07-05 (×3): qty 10
  Filled 2021-07-05: qty 1
  Filled 2021-07-05: qty 10

## 2021-07-05 NOTE — Progress Notes (Signed)
Speech Language Pathology Daily Session Note  Patient Details  Name: Donald [name] MRN: [id number] Date of Birth: 06/23/1956  Today's Date: 07/05/2021 SLP Individual Time: 0805-0850 SLP Individual Time Calculation (min): 45 min  Short Term Goals: Week 5: SLP Short Term Goal 1 (Week 5): STGs=LTGs due to ELOS  Skilled Therapeutic Interventions: Skilled treatment session focused on cognitive goals. Upon arrival, patient reported stomach discomfort. RN aware. Patient reported that he had eaten all of his breakfast meal this morning but had clearly only eaten ~25%. Patient with low frustration tolerance today, suspect due to not feeling well and required extra time and multiple repetitions of questions to verbalize a simple answer. SLP provided Mod-Max A verbal and visual cues for orientation to date (his wedding anniversary). Patient requested to brush his teeth and performed oral care with set-up and supervision level verbal cues for cessation. Patient requested to use the bathroom. Declined BSC and required encouragement for use of bedpan. Patient requested to stay on bedpan and SLP informed NT and nursing secretary prior to leaving patient as he would not call for assistance. Patient left upright in bed with alarm on and all needs within reach. Continue with current plan of care.       Pain Discomfort in stomach, RN aware. Patient repositioned and eventually placed on beddpan   Therapy/Group: Individual Therapy  [name] 07/05/2021, 12:26 PM

## 2021-07-05 NOTE — Progress Notes (Signed)
Occupational Therapy Session Note  Patient Details  Name: Donald [name] MRN: [id number] Date of Birth: 10/14/1956  Today's Date: 07/05/2021 OT Individual Time: 3875-6433 OT Individual Time Calculation (min): 58 min    Short Term Goals: Week 4:  OT Short Term Goal 1 (Week 4): LTG=STG 2.2 ELOS  Skilled Therapeutic Interventions/Progress Updates:    Patient in bed, alert, on bed pan.  He states that he still needs to have a BM and agrees to trying to sit on toilet (does not want to sit on hard commode surface)  supine to sitting edge of bed with max A and c/o pain in left leg.  Sit to stand from bed surface to stedy with mod/max A, +2 for stedy management to toilet.  He was continent of small BM seated on toilet.  Dependent x2 for clothing mangement and hygiene.   Max A to stand from low toilet surface.  Dependent lower body dressing.  Stedy to w/c +2.  Sit pivot transfer w/c to/from mat table with max A and min of 2nd.  Patient resistant to working on posture and unsupported sitting due to fatigue and back pain - attempted to relieve back pain with mobility and light stretch.  He remained seated in TIS w/c at close of session, seat belt alarm set and call bell in hand, w/c reclined   Therapy Documentation Precautions:  Precautions Precautions: Fall Precaution Comments: L hemiparesis, L inattention, pusher to the L, cognitive deficits, cortrak Restrictions Weight Bearing Restrictions: No   Therapy/Group: Individual Therapy  Barrie Lyme 07/05/2021, 7:27 AM

## 2021-07-05 NOTE — Plan of Care (Signed)
  Problem: Consults Goal: RH STROKE PATIENT EDUCATION Description: See Patient Education module for education specifics  Outcome: Progressing Goal: Nutrition Consult-if indicated Outcome: Progressing   Problem: RH BOWEL ELIMINATION Goal: RH STG MANAGE BOWEL WITH ASSISTANCE Description: STG Manage Bowel with min Assistance. Outcome: Progressing   Problem: RH BLADDER ELIMINATION Goal: RH STG MANAGE BLADDER WITH ASSISTANCE Description: STG Manage Bladder With min Assistance Outcome: Progressing   Problem: RH SKIN INTEGRITY Goal: RH STG MAINTAIN SKIN INTEGRITY WITH ASSISTANCE Description: STG Maintain Skin Integrity With min Assistance. Outcome: Progressing   Problem: RH SAFETY Goal: RH STG ADHERE TO SAFETY PRECAUTIONS W/ASSISTANCE/DEVICE Description: STG Adhere to Safety Precautions With cues and reminders Outcome: Progressing   Problem: RH KNOWLEDGE DEFICIT Goal: RH STG INCREASE KNOWLEDGE OF HYPERTENSION Description: Pt and family will be able to demonstrate understanding of medication management, dietary and lifestyle modifications to better control blood pressure and prevent stroke using handouts and booklets provided with mod I assist.  Outcome: Progressing Goal: RH STG INCREASE KNOWLEGDE OF HYPERLIPIDEMIA Description: Pt and family will be able to demonstrate understanding of medication management, dietary and lifestyle modifications to better control cholesterol levels and prevent stroke using handouts and booklets provided with mod I assist.  Outcome: Progressing Goal: RH STG INCREASE KNOWLEDGE OF STROKE PROPHYLAXIS Description: Pt and family will be able to demonstrate understanding of medication management, dietary and lifestyle modifications to better control blood pressure and prevent stroke using handouts and booklets provided with mod I assist.  Outcome: Progressing   

## 2021-07-05 NOTE — Progress Notes (Signed)
Patient ID: Donald [name], male   DOB: June 19, 1956, 65 y.o.   MRN: [id number]  SW f/u with pt wife Donald [name] to discuss d/c DME recommendations and HHA prefernce. SW will order w/c and slide board; has DABSC already. She reports she will f/u with SW about HHA preference as she does not have the list at this time. She asked about transportation home for pt. SW discussed non-ambulance transport, access [place] if already established, and private [employer]. Pt wife reports she will look in further to this. Wife will f/u with SW about HHA.  SW ordered DME: transfer board and w/c.   Cecile Sheerer, MSW, LCSWA Office: [phone] Cell: [phone] Fax: [phone]

## 2021-07-05 NOTE — Progress Notes (Signed)
Patient heart elevated, pt asymptomatic at this time.  Marissa Nestle, PA made aware. New orders received for EKG, Bladder scan and urinalysis and culture.

## 2021-07-05 NOTE — Progress Notes (Signed)
Occupational Therapy Session Note  Patient Details  Name: Donald [name] MRN: [id number] Date of Birth: 1955-12-31  Today's Date: 07/05/2021 OT Individual Time: 1130-1201 OT Individual Time Calculation (min): 31 min    Short Term Goals: Week 2:  OT Short Term Goal 1 (Week 2): Pt will don shirt with max A in supported sitting. OT Short Term Goal 1 - Progress (Week 2): Met OT Short Term Goal 2 (Week 2): Pt will maintain midline orientation in supported sitting with overall max A. OT Short Term Goal 2 - Progress (Week 2): Met OT Short Term Goal 3 (Week 2): Pt will completed grooming task with mod A in supported sitting. OT Short Term Goal 3 - Progress (Week 2): Met Week 3:  OT Short Term Goal 1 (Week 3): Patient will complete toilet transfer with max A of 1 OT Short Term Goal 1 - Progress (Week 3): Met OT Short Term Goal 2 (Week 3): Pt will recall hemi dressing techniques with min questioning cues OT Short Term Goal 2 - Progress (Week 3): Met OT Short Term Goal 3 (Week 3): Patient will complete 1 step of UB dressing task OT Short Term Goal 3 - Progress (Week 3): Met Week 4:  OT Short Term Goal 1 (Week 4): LTG=STG 2.2 ELOS   Skilled Therapeutic Interventions/Progress Updates:    Pt greeted at time of session sitting up in TIS chair agreeable to OT session, wife entered part of the way through session and remained for standing trials and dressing. Pt did have pain in LUE which has been ongoing, positional changes provided and RN applying patch to shoulder at end of session. Sit <> stand at [facility] (as per notes/wife family has ordered one for home transfers) and Max A for sit > stand and fluctuating between Mod/Max for support in partial standing for approx 1-2 mins. Returned to sitting position and doffed shirt Max A and donning new shirt per wife request (not stretchy fabric and more difficult) needing 2nd helper to manage over head. Set up in TIS reclined alarm on call bell in reach hand off  to nursing for med pass.   Therapy Documentation Precautions:  Precautions Precautions: Fall Precaution Comments: L hemiparesis, L inattention, pusher to the L, cognitive deficits, cortrak Restrictions Weight Bearing Restrictions: No      Therapy/Group: Individual Therapy  Viona Gilmore 07/05/2021, 12:13 PM

## 2021-07-05 NOTE — Progress Notes (Signed)
PROGRESS NOTE   Subjective/Complaints: Pt ate less this weekend. Had bms enema yesterday though. Still only ate 25% for breakfast today.   ROS: Limited due to cognitive/behavioral   Objective:   No results found. No results for input(s): WBC, HGB, HCT, PLT in the last 72 hours.    No results for input(s): NA, K, CL, CO2, GLUCOSE, BUN, CREATININE, CALCIUM in the last 72 hours.      Intake/Output Summary (Last 24 hours) at 07/05/2021 0857 Last data filed at 07/05/2021 0700 Gross per 24 hour  Intake 60 ml  Output --  Net 60 ml         Physical Exam: Vital Signs Blood pressure (!) 111/93, pulse (!) 108, temperature 98.3 F (36.8 C), temperature source Oral, resp. rate 16, height 5\' 5"  (1.651 m), weight 60 kg, SpO2 100 %.    Constitutional: No distress . Vital signs reviewed. HEENT: EOMI, oral membranes moist Neck: supple Cardiovascular: RRR without murmur. No JVD    Respiratory/Chest: CTA Bilaterally without wheezes or rales. Normal effort    GI/Abdomen: BS +, non-tender, non-distended Ext: no clubbing, cyanosis, or edema Psych: pleasant, cooperates Skin: No evidence of breakdown, no evidence of rash Neurological: alert- doesn't remember LBM.     Comments: left C7. STM deficits. Left inattention present. Dense left hemiparesis without change. RUE and RLE 4/5. Decreased LT Left side  ongoing. DTR's 3+ on left side. Musc: pain at left shoulder, wrist and thigh with ROM. Left shoulder tender/tight with ROM Tone: MAS 2- in the left bicep and finger flexors, no appreciable tone in the left lower limb detected.    Assessment/Plan: 1. Functional deficits which require 3+ hours per day of interdisciplinary therapy in a comprehensive inpatient rehab setting. Physiatrist is providing close team supervision and 24 hour management of active medical problems listed below. Physiatrist and rehab team continue to assess  barriers to discharge/monitor patient progress toward functional and medical goals  Care Tool:  Bathing    Body parts bathed by patient: Chest, Abdomen, Face   Body parts bathed by helper: Right arm, Left arm, Front perineal area, Buttocks, Right upper leg, Left upper leg, Right lower leg, Left lower leg     Bathing assist Assist Level: Maximal Assistance - Patient 24 - 49%     Upper Body Dressing/Undressing Upper body dressing   What is the patient wearing?: Pull over shirt    Upper body assist Assist Level: Maximal Assistance - Patient 25 - 49%    Lower Body Dressing/Undressing Lower body dressing      What is the patient wearing?: Pants, Incontinence brief     Lower body assist Assist for lower body dressing: Maximal Assistance - Patient 25 - 49%     Toileting Toileting    Toileting assist Assist for toileting: Total Assistance - Patient < 25%     Transfers Chair/bed transfer  Transfers assist     Chair/bed transfer assist level: Maximal Assistance - Patient 25 - 49%     Locomotion Ambulation   Ambulation assist   Ambulation activity did not occur: Safety/medical concerns  Assist level: 2 helpers Assistive device: Other (comment) (3 Musketeer) Max distance: 44ft  Walk 10 feet activity   Assist  Walk 10 feet activity did not occur: Safety/medical concerns  Assist level: 2 helpers Assistive device:  (3 musketeers)   Walk 50 feet activity   Assist Walk 50 feet with 2 turns activity did not occur: Safety/medical concerns         Walk 150 feet activity   Assist Walk 150 feet activity did not occur: Safety/medical concerns         Walk 10 feet on uneven surface  activity   Assist Walk 10 feet on uneven surfaces activity did not occur: Safety/medical concerns         Wheelchair     Assist Will patient use wheelchair at discharge?: Yes Type of Wheelchair: Manual    Wheelchair assist level: Dependent - Patient 0% Max  wheelchair distance: 150    Wheelchair 50 feet with 2 turns activity    Assist        Assist Level: Dependent - Patient 0%   Wheelchair 150 feet activity     Assist      Assist Level: Dependent - Patient 0%   Blood pressure (!) 111/93, pulse (!) 108, temperature 98.3 F (36.8 C), temperature source Oral, resp. rate 16, height 5\' 5"  (1.651 m), weight 60 kg, SpO2 100 %.    Medical Problem List and Plan: 1.  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              -patient may shower             -ELOS/Goals: have extended stay to 7/27 to accommodate set up at home  -Continue CIR therapies including PT, OT, and SLP  2.  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 Wife prefers no oral anticoagulation given hematoma.  -DVT/anticoagulation:  Mechanical:  Antiembolism stockings, knee (TED hose) Bilateral lower extremities             -antiplatelet therapy: N/A due to concerns of CAA 3. Low back pain, left hemiplegic shoulder, ?HO left hip, spasticity/neuropathic pain:   - continue lidocaine patch -increased gabapentin 200mg  tid-changed to 400 mg nightly on 7/23 patient has no significant pain 724 -kpad for back, tylenol prn -increase tizanidine to 2mg  QID, stopped due to wife request -continue ROM, activities with therapy -weaning prednisone to off -pain has been variable in location, sometimes affecting right side -gabapentin changed to nightly and tizanidine stopped d/t lethargy. Observe today 4. Arousal:  improved  - ritalin  10mg  bid for arousal/initiaton             -antipsychotic agents: N/A 5. Neuropsych: This patient is not fully capable of making decisions on his own behalf. 6. Skin/Wound Care: Routine pressure relief measures. 7. Fluids/Electrolytes/Nutrition: monitor fluid intake while on PO alone  7/15--had been eating better until this weekend     -K+ sl low---repleting   -albumin  improving   -check bmet/cbc, prealbumin today 8. Recurrent aspiration event:    -unasyn started 6/20---stopped 6/30             -- Wbc's 5k             --CXR recently stable, lungs clear 10. Blood pressure:  Vitals:   07/04/21 2137 07/05/21 0626  BP: (!) 119/95 (!) 111/93  Pulse: (!) 108 (!) 108  Resp: 18 16  Temp: 98.9 F (37.2 C) 98.3 F (36.8 C)  SpO2: 100% 100%  Bp controlled. Tachycardic. Check labs  as above 11. Progressive cognitive decline: To reschedule dementia work up at [facility] after d/c. 12. Hyperglycemia: Hgb A1C- 5.8.             --Monitor BS every  4 hours and use SSI for elevated  BS   CBG (last 3)  Recent Labs    07/04/21 0604 07/04/21 1156 07/05/21 0621  GLUCAP 96 115* 107*  Controlled 7/25  13. Dysphagia: . D2/thins diet initiated!.              -megace trial VERY beneficial begin weaning. Has app for food again!    14. Tachycardia: 80's-90's - monitor HR TID 15. Elevated liver transaminases: Liver u/s with one small cystic structure.   -all LFT's nearly back wnl  -AP slightly elevated likely related to HO 16. Mild anemia-Ferritin elevated but Hgb, Fe++ and TIBC all low which point towards anemia of chronic disease.   17. Constipation  7/25 moved bowels after enema yesterday     LOS: 31 days A FACE TO FACE EVALUATION WAS PERFORMED  Ranelle Oyster 07/05/2021, 8:57 AM

## 2021-07-05 NOTE — Progress Notes (Signed)
Physical Therapy Session Note  Patient Details  Name: Donald [name] MRN: [id number] Date of Birth: 12-26-1955  Today's Date: 07/05/2021 PT Individual Time: 1500-1540 PT Individual Time Calculation (min): 40 min   Short Term Goals: Week 5:  PT Short Term Goal 1 (Week 5): STGs=LTGs  Skilled Therapeutic Interventions/Progress Updates:     Patient in TIS w/c with his wife in the room upon PT arrival. Patient alert and agreeable to PT session. Patient reported un-rated L hemi-body pain during session, indicated by groaning and grimacing with movement of limbs, RN made aware. PT provided repositioning, rest breaks, and distraction as pain interventions throughout session.   Vitals:  BP 123/78, HR 123 at rest in sitting Manual pulse 122 over 60 sec with steady rhythm  RN made aware and encouraged therapy to proceed due to patient's hx of A-fib and patient not in distress.  HR after standing x1 143 bpm  Returned patient to bed, PA and RN made aware. NT setting up EKG with PA at end of session. Patient denied symptoms of chest pain, SOB, or dizziness throughout session.   Therapeutic Activity: Bed Mobility: Patient performed sit to supine with max A of 1-2 for lower extremity and trunk management. Provided verbal cues for log roll technique and bringing knees to chest to lift his legs onto the bed. Transfers: Patient performed sit to/from stand x2 in the [place] with mod A +2 with increased time for initiation. Provided verbal cues for initiation, forward weight shift, and hip/knee/trunk extension in standing. Patient was transferred TIS w/c>[place] manual w/c>bed dependently in the [place] to simulate home transfers with person [place]. Patient's wife provided encouragement with transfers and standing.    Patient sat in [place] manual w/c as PT demonstrated tilt feature for improved positioning in the chair and educated on w/c parts and breakdown for transportation. Patient reported improved sitting  tolerance with improved positioning from padded contoured back rest, 20 degree tilt for reduced sacral sitting and forward translation of hips in upright sitting due to decreased trunk support,. This chair will improve patients transport in the home  due to 30" doorways and ability to fold w/c to place it in the car while providing improved positioning for sitting tolerance and maintaining seat height for transfers to standing in the [place]. Discussed with CSW about equipment recommendations at end of session.    Patient in bed with his wife, PA, and NT at bedside at end of session.   Therapy Documentation Precautions:  Precautions Precautions: Fall Precaution Comments: L hemiparesis, L inattention, pusher to the L, cognitive deficits, cortrak Restrictions Weight Bearing Restrictions: No    Therapy/Group: Individual Therapy  Cadie Sorci L Yatzil Clippinger PT, DPT  07/05/2021, 3:59 PM

## 2021-07-06 DIAGNOSIS — I82412 Acute embolism and thrombosis of left femoral vein: Secondary | ICD-10-CM

## 2021-07-06 DIAGNOSIS — K560 Paralytic ileus: Secondary | ICD-10-CM

## 2021-07-06 DIAGNOSIS — G8112 Spastic hemiplegia affecting left dominant side: Secondary | ICD-10-CM

## 2021-07-06 DIAGNOSIS — R638 Other symptoms and signs concerning food and fluid intake: Secondary | ICD-10-CM

## 2021-07-06 LAB — CBC WITH DIFFERENTIAL/PLATELET
Abs Immature Granulocytes: 0.18 10*3/uL — ABNORMAL HIGH (ref 0.00–0.07)
Basophils Absolute: 0 10*3/uL (ref 0.0–0.1)
Basophils Relative: 0 %
Eosinophils Absolute: 0 10*3/uL (ref 0.0–0.5)
Eosinophils Relative: 0 %
HCT: 39.6 % (ref 39.0–52.0)
Hemoglobin: 12.9 g/dL — ABNORMAL LOW (ref 13.0–17.0)
Immature Granulocytes: 1 %
Lymphocytes Relative: 12 %
Lymphs Abs: 1.7 10*3/uL (ref 0.7–4.0)
MCH: 30.4 pg (ref 26.0–34.0)
MCHC: 32.6 g/dL (ref 30.0–36.0)
MCV: 93.4 fL (ref 80.0–100.0)
Monocytes Absolute: 1.7 10*3/uL — ABNORMAL HIGH (ref 0.1–1.0)
Monocytes Relative: 12 %
Neutro Abs: 11 10*3/uL — ABNORMAL HIGH (ref 1.7–7.7)
Neutrophils Relative %: 75 %
Platelets: 235 10*3/uL (ref 150–400)
RBC: 4.24 MIL/uL (ref 4.22–5.81)
RDW: 14.7 % (ref 11.5–15.5)
WBC: 14.7 10*3/uL — ABNORMAL HIGH (ref 4.0–10.5)
nRBC: 0 % (ref 0.0–0.2)

## 2021-07-06 LAB — BASIC METABOLIC PANEL
Anion gap: 11 (ref 5–15)
BUN: 23 mg/dL (ref 8–23)
CO2: 21 mmol/L — ABNORMAL LOW (ref 22–32)
Calcium: 9.8 mg/dL (ref 8.9–10.3)
Chloride: 100 mmol/L (ref 98–111)
Creatinine, Ser: 0.85 mg/dL (ref 0.61–1.24)
GFR, Estimated: >60 mL/min (ref >60)
Glucose, Bld: 121 mg/dL — ABNORMAL HIGH (ref 70–99)
Potassium: 4.4 mmol/L (ref 3.5–5.1)
Sodium: 132 mmol/L — ABNORMAL LOW (ref 135–145)

## 2021-07-06 LAB — GLUCOSE, CAPILLARY: Glucose-Capillary: 157 mg/dL — ABNORMAL HIGH (ref 70–99)

## 2021-07-06 MED ORDER — BOOST / RESOURCE BREEZE PO LIQD CUSTOM
1.0000 | Freq: Three times a day (TID) | ORAL | Status: DC
  Administered 2021-07-06 – 2021-07-11 (×11): 1 via ORAL

## 2021-07-06 MED ORDER — METOCLOPRAMIDE HCL 5 MG/ML IJ SOLN
10.0000 mg | Freq: Four times a day (QID) | INTRAMUSCULAR | Status: DC
  Administered 2021-07-06 – 2021-07-09 (×14): 10 mg via INTRAVENOUS
  Filled 2021-07-06 (×14): qty 2

## 2021-07-06 MED ORDER — PROSOURCE PLUS PO LIQD
30.0000 mL | Freq: Three times a day (TID) | ORAL | Status: DC
  Administered 2021-07-07 – 2021-07-12 (×12): 30 mL via ORAL
  Filled 2021-07-06 (×14): qty 30

## 2021-07-06 NOTE — Progress Notes (Signed)
Physical Therapy Session Note  Patient Details  Name: Donald [name] MRN: [id number] Date of Birth: 03-08-1956  Today's Date: 07/06/2021 PT Missed Time: 60 Minutes Missed Time Reason: Patient fatigue  Short Term Goals: Week 4:  PT Short Term Goal 1 (Week 4): STGs = LTGs  Skilled Therapeutic Interventions/Progress Updates:     Pt asleep. PT will follow up as appropriate.  Therapy Documentation Precautions:  Precautions Precautions: Fall Precaution Comments: L hemiparesis, L inattention, pusher to the L, cognitive deficits, cortrak Restrictions Weight Bearing Restrictions: No    Therapy/Group: Individual Therapy  Beau Fanny PT, DPT 07/06/2021, 4:39 PM

## 2021-07-06 NOTE — Progress Notes (Signed)
Speech Language Pathology Daily Session Note  Patient Details  Name: Donald [name] MRN: [id number] Date of Birth: 16-Jan-1956  Today's Date: 07/06/2021 SLP Individual Time: 0715-0800 SLP Individual Time Calculation (min): 45 min  Short Term Goals: Week 5: SLP Short Term Goal 1 (Week 5): STGs=LTGs due to ELOS  Skilled Therapeutic Interventions: Skilled treatment session focused on cognitive and dysphagia goals. SLP facilitated session by providing Max A multimodal cues for orientation to situation and place. Patient appeared confrontational today and mildly argumentative when responding to simple yes/no questions regarding wants/needs. Patient angry because he was unable to eat pizza for breakfast but eventually agreeable to breakfast. Patient consumed ~25% of meal without overt s/s of aspiration but required more than a reasonable amount of time for mastication and oral transit, suspect due to distracted by pain. Recommend patient continue current diet. Patient also required more than a reasonable amount of time to complete basic self-care tasks like washing his face and was often praying aloud due to pain, etc. Patient left upright in bed with alarm on and all needs within reach. Continue with current plan of care.      Pain Pain in his right shoulder-patient repositioned   Therapy/Group: Individual Therapy  Laquilla Dault 07/06/2021, 12:25 PM

## 2021-07-06 NOTE — Progress Notes (Signed)
Patient > month out from bleed w/ history of L-CFV DVT and RLE edema. He is close to discharge and we contacted Dr. Roda Shutters regarding feasibility of DVT treatment.  Patient does have IVC filter. He again expressed concerns of CAA with risk of bleeding in his brain. He felt that treatment not indicated as IVC filter should protect from PE. He felt that anticoagulation would not be recommended except in case of life threatening situation and would recommend discussing w/family risks v/s benefits prior to starting any AC.

## 2021-07-06 NOTE — Progress Notes (Signed)
PROGRESS NOTE   Subjective/Complaints: Pt in bed, irritable. Still says he feels full.   ROS: Limited due to cognitive/behavioral    Objective:   DG Chest 2 View  Result Date: 07/06/2021 CLINICAL DATA:  Leukocytosis and abdominal pain. EXAM: CHEST - 2 VIEW COMPARISON:  06/05/2021. FINDINGS: Interval removal of feeding tube. Mediastinum and hilar structures normal. Lungs are clear. No pleural effusion or pneumothorax. Stable mild elevation left hemidiaphragm. Heart size normal. Degenerative change thoracic spine. IMPRESSION: Interval removal of feeding tube. Interim resolution of left base infiltrate. No acute cardiopulmonary disease. Electronically Signed   By: Maisie Fus  Register   On: 07/06/2021 05:17   DG Abd 1 View  Result Date: 07/06/2021 CLINICAL DATA:  Leukocytosis and abdominal pain. EXAM: ABDOMEN - 1 VIEW COMPARISON:  Abdomen 06/04/2021. FINDINGS: Interval removal of feeding tube. IVC filter noted stable position. Air-filled loops of small and large bowel noted with minimal distention. Findings suggest adynamic ileus. Oral contrast noted in the rectum. Degenerative changes lumbar spine and both hips. Pelvic calcifications consistent phleboliths. IMPRESSION: Air-filled loops of small and large bowel noted with minimal distention. Findings suggest adynamic ileus. Electronically Signed   By: Maisie Fus  Register   On: 07/06/2021 05:19   Recent Labs    07/05/21 1109 07/06/21 0938  WBC 12.9* 14.7*  HGB 15.1 12.9*  HCT 46.2 39.6  PLT 226 235      Recent Labs    07/05/21 1109 07/06/21 0514  NA 130* 132*  K 4.5 4.4  CL 96* 100  CO2 20* 21*  GLUCOSE 120* 121*  BUN 24* 23  CREATININE 0.93 0.85  CALCIUM 10.4* 9.8        Intake/Output Summary (Last 24 hours) at 07/06/2021 1218 Last data filed at 07/06/2021 0900 Gross per 24 hour  Intake 160 ml  Output 200 ml  Net -40 ml         Physical Exam: Vital Signs Blood  pressure 118/76, pulse (!) 122, temperature 98.2 F (36.8 C), resp. rate 20, height 5\' 5"  (1.651 m), weight 60.2 kg, SpO2 100 %.    Constitutional: No distress . Vital signs reviewed. HEENT: EOMI, oral membranes moist Neck: supple Cardiovascular: RRR without murmur. No JVD    Respiratory/Chest: CTA Bilaterally without wheezes or rales. Normal effort    GI/Abdomen: BS scarce, sl distended. Ext: no clubbing, cyanosis, L>R LE edema 1+ Psych: very irritable  Skin: No evidence of breakdown, no evidence of rash Neurological: alert- doesn't remember LBM.     Comments: left C7. STM deficits. Left inattention present. Dense left hemiparesis without change. RUE and RLE 4/5. Decreased LT Left side  ongoing. DTR's 3+ on left side. Musc: pain at left shoulder, wrist and thigh with ROM. Left shoulder tender/tight with ROM Tone: MAS 2-3 in the left bicep and finger flexors, no appreciable tone in the left lower limb detected although hip still seems tender with ROM    Assessment/Plan: 1. Functional deficits which require 3+ hours per day of interdisciplinary therapy in a comprehensive inpatient rehab setting. Physiatrist is providing close team supervision and 24 hour management of active medical problems listed below. Physiatrist and rehab team continue to assess barriers to  discharge/monitor patient progress toward functional and medical goals  Care Tool:  Bathing    Body parts bathed by patient: Chest, Abdomen, Face   Body parts bathed by helper: Right arm, Left arm, Front perineal area, Buttocks, Right upper leg, Left upper leg, Right lower leg, Left lower leg     Bathing assist Assist Level: Maximal Assistance - Patient 24 - 49%     Upper Body Dressing/Undressing Upper body dressing   What is the patient wearing?: Pull over shirt    Upper body assist Assist Level: Maximal Assistance - Patient 25 - 49%    Lower Body Dressing/Undressing Lower body dressing      What is the patient  wearing?: Pants, Incontinence brief     Lower body assist Assist for lower body dressing: Dependent - Patient 0%     Toileting Toileting    Toileting assist Assist for toileting: 2 Helpers     Transfers Chair/bed transfer  Transfers assist     Chair/bed transfer assist level: Dependent - Patient 0% ([place])     Locomotion Ambulation   Ambulation assist   Ambulation activity did not occur: Safety/medical concerns  Assist level: 2 helpers Assistive device: Other (comment) (3 Musketeer) Max distance: 71ft   Walk 10 feet activity   Assist  Walk 10 feet activity did not occur: Safety/medical concerns  Assist level: 2 helpers Assistive device:  (3 musketeers)   Walk 50 feet activity   Assist Walk 50 feet with 2 turns activity did not occur: Safety/medical concerns         Walk 150 feet activity   Assist Walk 150 feet activity did not occur: Safety/medical concerns         Walk 10 feet on uneven surface  activity   Assist Walk 10 feet on uneven surfaces activity did not occur: Safety/medical concerns         Wheelchair     Assist Will patient use wheelchair at discharge?: Yes Type of Wheelchair: Manual    Wheelchair assist level: Dependent - Patient 0% Max wheelchair distance: 150    Wheelchair 50 feet with 2 turns activity    Assist        Assist Level: Dependent - Patient 0%   Wheelchair 150 feet activity     Assist      Assist Level: Dependent - Patient 0%   Blood pressure 118/76, pulse (!) 122, temperature 98.2 F (36.8 C), resp. rate 20, height 5\' 5"  (1.651 m), weight 60.2 kg, SpO2 100 %.    Medical Problem List and Plan: 1.  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              -patient may shower             -ELOS/Goals: have extended stay to 7/30 given medical issues. Spoke with wife  -Continue CIR therapies including PT, OT, and SLP   2.   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 Wife prefers no oral anticoagulation given hematoma.  -DVT/anticoagulation:  Mechanical:  Antiembolism stockings, knee (TED hose) Bilateral lower extremities             -antiplatelet therapy: N/A due to concerns of CAA 3. Low back pain, left hemiplegic shoulder, ?HO left hip, spasticity/neuropathic pain:   - continue lidocaine patch -gabapentin changed to 400 mg nightly on 7/23 d/t day time fatigue.   -kpad for back, tylenol prn -off prednisone, tizanidine -ROM/splinting as possible  4. Arousal:  improved  - ritalin  10mg  bid for arousal/initiaton             -antipsychotic agents: N/A 5. Neuropsych: This patient is not fully capable of making decisions on his own behalf. 6. Skin/Wound Care: Routine pressure relief measures. 7. Fluids/Electrolytes/Nutrition: monitor fluid intake while on PO alone  7/26--still not eating much   -K+ 4.4   -sodium up to 132   -still looks prerenal--IVF continues. Has normal EF 8. Recurrent aspiration event:    -unasyn started 6/20---stopped 6/30             -- Wbc's 5k             --CXR recently stable, lungs clear 10. Blood pressure:  7/30 Vitals:   07/06/21 0955 07/06/21 1211  BP: 118/84 118/76  Pulse: (!) 115 (!) 122  Resp: 19 20  Temp: 98 F (36.7 C) 98.2 F (36.8 C)  SpO2: 100% 100%  Bp controlled. Still tachycardic d/t volume most likely 11. Progressive cognitive decline: To reschedule dementia work up at [facility] after d/c. 12. Hyperglycemia: Hgb A1C- 5.8.             --Monitor BS every  4 hours and use SSI for elevated  BS   CBG (last 3)  Recent Labs    07/04/21 1156 07/05/21 0621 07/06/21 1213  GLUCAP 115* 107* 157*   7/26 fair control  13. Dysphagia: . D2/thins diet initiated!.              -megace trial VERY beneficial begin weaning. Has app for food again!    14. Tachycardia: see above - monitor HR TID 15. Elevated liver transaminases: Liver u/s with one small cystic  structure.   -all LFT's nearly back wnl  -AP slightly elevated likely related to HO 16. Mild anemia-Ferritin elevated but Hgb, Fe++ and TIBC all low which point towards anemia of chronic disease.   17. Adynamic ileus  -reglan iv  -clear liquid diet, continue meds  -support volume, electrolytes 18. Leukocytosis: may be due to thrombi, reactive  -ucx pending  -continue empiric ceftriaxone  -monitor clinically, serial labs        LOS: 32 days A FACE TO FACE EVALUATION WAS PERFORMED  8/26 07/06/2021, 12:18 PM

## 2021-07-06 NOTE — Progress Notes (Signed)
Nutrition Follow-up  DOCUMENTATION CODES:   Non-severe (moderate) malnutrition in context of chronic illness  INTERVENTION:   -Increase Boost Breeze po to TID, each supplement provides 250 kcal and 9 grams of protein  -Increase 30 ml Prosource Plus to TID, each supplement provides 100 kcals and 15 grams protein -If prolonged NPO/clear liquid diet status is anticipated, consider initiation of nutrition support  NUTRITION DIAGNOSIS:   Moderate Malnutrition related to chronic illness as evidenced by mild fat depletion, moderate muscle depletion.  Ongoing  GOAL:   Patient will meet greater than or equal to 90% of their needs  Progressing   MONITOR:   PO intake, Supplement acceptance, Diet advancement, Skin, Weight trends, Labs, I & O's  REASON FOR ASSESSMENT:   New TF    ASSESSMENT:   65 year old right-handed male with history of OA and and progressive dementia work-up who was originally admitted on 05/11/2021 with acute left hemiparesis with sensory deficits, right gaze preference and left facial droop due to 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 CT of head done 6/12 revealing interval progression of right frontal hematoma with worsening of mass-effect and midline shift. CT brain repeated showing truncated arterial cortical branch at upper and lateral hematoma. He continues to be limited by left sided weakness with right gaze preference and inability to cross midline, cognitive deficits with delay in processing and motor planning deficits, left hemiplegia with emerging tone affecting mobility and ADLs CIR recommended due to functional decline  7/26- KUB revealed adynamic ileus, downgraded to clear liquid diet  Reviewed I/O's: -22 ml x 24 hours and +4.4 L since 06/22/21  UOP: 200 ml x 24 hours   Pt receiving personal care at time of visit. Per chart review, pt is very irritable today.   Pt with variable intake. Noted meal completion 20-100%. Pt consumed  only 5% of breakfast this morning. Pt is refusing supplements.   Medications reviewed and include reglan, miralax, and 0.9% sodioum chloride infusion @ 75 ml/hr.   Labs reviewed: Na: 132.    Diet Order:   Diet Order             Diet clear liquid Room service appropriate? Yes; Fluid consistency: Thin  Diet effective now                   EDUCATION NEEDS:   Not appropriate for education at this time  Skin:  Skin Assessment: Reviewed RN Assessment Skin Integrity Issues:: Other (Comment) Other: puncture R neck  Last BM:  07/04/21  Height:   Ht Readings from Last 1 Encounters:  06/04/21 5\' 5"  (1.651 m)    Weight:   Wt Readings from Last 1 Encounters:  07/06/21 60.2 kg    Ideal Body Weight:  61.8 kg  BMI:  Body mass index is 22.09 kg/m.  Estimated Nutritional Needs:   Kcal:  1850-2050  Protein:  90-110 grams  Fluid:  >/= 1.9 L/day    07/08/21, RD, LDN, CDCES Registered Dietitian II Certified Diabetes Care and Education Specialist Please refer to [facility] for RD and/or RD on-call/weekend/after hours pager

## 2021-07-06 NOTE — Progress Notes (Signed)
   07/06/21 0725  Assess: MEWS Score  Temp 98.7 F (37.1 C)  BP 116/80  Pulse Rate (!) 117  Resp 16  SpO2 100 %  Assess: MEWS Score  MEWS Temp 0  MEWS Systolic 0  MEWS Pulse 2  MEWS RR 0  MEWS LOC 0  MEWS Score 2  MEWS Score Color Yellow  Assess: if the MEWS score is Yellow or Red  Were vital signs taken at a resting state? Yes  Focused Assessment No change from prior assessment  Early Detection of Sepsis Score *See Row Information* Low  MEWS guidelines implemented *See Row Information* Yes  Treat  MEWS Interventions Escalated (See documentation below)  Take Vital Signs  Increase Vital Sign Frequency  Yellow: Q 2hr X 2 then Q 4hr X 2, if remains yellow, continue Q 4hrs  Escalate  MEWS: Escalate Yellow: discuss with charge nurse/RN and consider discussing with provider and RRT  Notify: Charge Nurse/RN  Name of Charge Nurse/RN Notified mekides, RN  Date Charge Nurse/RN Notified 07/06/21  Time Charge Nurse/RN Notified [id number]  Notify: Provider  Provider Name/Title pam love, pa  Date Provider Notified 07/05/21  Time Provider Notified 1500  Notification Type Face-to-face  Notification Reason Other (Comment) (elevated hr)  Provider response See new orders  Date of Provider Response 07/05/21  Time of Provider Response 1500  Document  Patient Outcome Other (Comment) (stable)  Progress note created (see row info) Yes

## 2021-07-06 NOTE — Progress Notes (Signed)
Occupational Therapy Session Note  Patient Details  Name: Kimari Coudriet MRN: [id number] Date of Birth: [birth year]  Today's Date: 07/06/2021 OT Individual Time: 1000-1030 OT Individual Time Calculation (min): 30 min   Short Term Goals: Week 4:  OT Short Term Goal 1 (Week 4): LTG=STG 2.2 ELOS  Skilled Therapeutic Interventions/Progress Updates:    Pt greeted semi-reclined in bed asleep, able to wake and agreeable to OT treatment session. Pt needed total A to get to EOB today, seemed more argumentative and had difficulty getting sitting balance. Pt with storng posterior and lateral lean to the L. Needed max A progressing to mod A, but then back to max A with dynamic task of thread pants. Max A Sit<>stand to pull up pants. Pt took rest break, then needed max/total A to stand-pivot to wc as pt pushing with pivot. OT noted pt had been incontinent of urine. Worked on sit<>stands at the sink with mod A while OT assisted with donning clean brief and pulling up pants. OT placed SAEBO e-stim on shoulder. SAEBO left on for 60 minutes. OT returned to remove SAEBO with skin intact and no adverse reactions.  Saebo Stim One 330 pulse width 35 Hz pulse rate On 8 sec/ off 8 sec Ramp up/ down 2 sec Symmetrical Biphasic wave form  Max intensity 175mA at 500 Ohm load  Pt left semi-reclined in bed with bed alarm on, call bell in reach and needs met.    Therapy Documentation Precautions:  Precautions Precautions: Fall Precaution Comments: L hemiparesis, L inattention, pusher to the L, cognitive deficits, cortrak Restrictions Weight Bearing Restrictions: No Pain: Pain Assessment Pain Scale: 0-10 Pain Score: 0-No pain   Therapy/Group: Individual Therapy  Valma Cava 07/06/2021, 10:38 AM

## 2021-07-06 NOTE — Plan of Care (Signed)
  Problem: Consults Goal: RH STROKE PATIENT EDUCATION Description: See Patient Education module for education specifics  Outcome: Progressing Goal: Nutrition Consult-if indicated Outcome: Progressing   Problem: RH BOWEL ELIMINATION Goal: RH STG MANAGE BOWEL WITH ASSISTANCE Description: STG Manage Bowel with min Assistance. Outcome: Progressing   Problem: RH BLADDER ELIMINATION Goal: RH STG MANAGE BLADDER WITH ASSISTANCE Description: STG Manage Bladder With min Assistance Outcome: Progressing   Problem: RH SKIN INTEGRITY Goal: RH STG MAINTAIN SKIN INTEGRITY WITH ASSISTANCE Description: STG Maintain Skin Integrity With min Assistance. Outcome: Progressing   Problem: RH SAFETY Goal: RH STG ADHERE TO SAFETY PRECAUTIONS W/ASSISTANCE/DEVICE Description: STG Adhere to Safety Precautions With cues and reminders Outcome: Progressing   Problem: RH KNOWLEDGE DEFICIT Goal: RH STG INCREASE KNOWLEDGE OF HYPERTENSION Description: Pt and family will be able to demonstrate understanding of medication management, dietary and lifestyle modifications to better control blood pressure and prevent stroke using handouts and booklets provided with mod I assist.  Outcome: Progressing Goal: RH STG INCREASE KNOWLEGDE OF HYPERLIPIDEMIA Description: Pt and family will be able to demonstrate understanding of medication management, dietary and lifestyle modifications to better control cholesterol levels and prevent stroke using handouts and booklets provided with mod I assist.  Outcome: Progressing Goal: RH STG INCREASE KNOWLEDGE OF STROKE PROPHYLAXIS Description: Pt and family will be able to demonstrate understanding of medication management, dietary and lifestyle modifications to better control blood pressure and prevent stroke using handouts and booklets provided with mod I assist.  Outcome: Progressing

## 2021-07-06 NOTE — Progress Notes (Signed)
Pt refuses lab recollect this am.

## 2021-07-06 NOTE — Patient Care Conference (Signed)
Inpatient RehabilitationTeam Conference and Plan of Care Update Date: 07/06/2021   Time: 10:33 AM    Patient Name: Donald [name]      Medical Record Number: [id number]  Date of Birth: 1955-12-28 Sex: Male         Room/Bed: 4W05C/4W05C-01 Payor Info: Payor: Advertising copywriter / Plan: [employer]HA / Product Type: *No Product type* /    Admit Date/Time:  06/04/2021  4:18 PM  Primary Diagnosis:  Intraparenchymal hematoma of brain [facility])  Hospital Problems: Principal Problem:   Intraparenchymal hematoma of brain [facility]) Active Problems:   Transaminitis    Expected Discharge Date: Expected Discharge Date: 07/09/21  Team Members Present: Physician leading conference: Dr. Faith Rogue Social Worker Present: Cecile Sheerer, LCSWA Nurse Present: Kennyth Arnold, RN PT Present: Malachi Pro, PT OT Present: Kearney Hard, OT SLP Present: Feliberto Gottron, SLP PPS Coordinator present : Fae Pippin, SLP     Current Status/Progress Goal Weekly Team Focus  Bowel/Bladder             Swallow/Nutrition/ Hydration   Dys. 2 textures with thin liquids, supervision  Min A  Family Education   ADL's   Max A overall  Mod/max A  L UE NMR, NMES, pt/family edycation, self-care retraining   Mobility   modA bed mobility, maxA/modA transfers, totalA car trasnfer  modA  DC prep   Communication   Min A  Min A  Family Educatin   Safety/Cognition/ Behavioral Observations  Mod A  Min A  Family Education   Pain             Skin               Discharge Planning:  Pt to d/c to home with support from wife and family. Family edu completed on 7/18 with wife, son and dtr. DME ordered. Waiting on HHA preference.   Team Discussion: Developed ileus. Very negative attitude today and depressed. Incontinent B/B. Reports left shoulder pain, MASD to bottom, using barrier cream.  Patient on target to meet rehab goals: Mod assist overall. Wife is purchasing a steady. Total assist today. Not in a good mood today.  Family education session, car transfer did not go well. Will most likely need transport home. SLP reports argumentative about everything. Not eating and drinking as much.  *See Care Plan and progress notes for long and short-term goals.   Revisions to Treatment Plan:  Treating ileus.  Teaching Needs: Family education, medication management, pain management, bowel/bladder management, skin/wound care, nutrition management, transfer training, steady training, balance training, endurance training, safety awareness.  Current Barriers to Discharge: Decreased caregiver support, Medical stability, Home enviroment access/layout, Incontinence, Wound care, Lack of/limited family support, Insurance for SNF coverage, Medication compliance, Behavior, and Nutritional means  Possible Resolutions to Barriers: Continue current medications, provide emotional support.     Medical Summary Current Status: ongoing left sided pain, tone/hemiplegic shoulder/HO. had to back off meds d/t lethargy. now with ileus  Barriers to Discharge: Medical stability   Possible Resolutions to Barriers/Weekly Focus: rx ileus, pain and nutritional mgt, daily assessment of labs   Continued Need for Acute Rehabilitation Level of Care: The patient requires daily medical management by a physician with specialized training in physical medicine and rehabilitation for the following reasons: Direction of a multidisciplinary physical rehabilitation program to maximize functional independence : Yes Medical management of patient stability for increased activity during participation in an intensive rehabilitation regime.: Yes Analysis of laboratory values and/or radiology reports with any subsequent need for  medication adjustment and/or medical intervention. : Yes   I attest that I was present, lead the team conference, and concur with the assessment and plan of the team.   Tennis Must 07/06/2021, 3:32 PM

## 2021-07-06 NOTE — Progress Notes (Signed)
Occupational Therapy Session Note  Patient Details  Name: Donald [name] MRN: [id number] Date of Birth: 1956/10/16  Today's Date: 07/06/2021 OT Individual Time: 1601-1630 OT Individual Time Calculation (min): 29 min    Short Term Goals: Week 2:  OT Short Term Goal 1 (Week 2): Pt will don shirt with max A in supported sitting. OT Short Term Goal 1 - Progress (Week 2): Met OT Short Term Goal 2 (Week 2): Pt will maintain midline orientation in supported sitting with overall max A. OT Short Term Goal 2 - Progress (Week 2): Met OT Short Term Goal 3 (Week 2): Pt will completed grooming task with mod A in supported sitting. OT Short Term Goal 3 - Progress (Week 2): Met Week 3:  OT Short Term Goal 1 (Week 3): Patient will complete toilet transfer with max A of 1 OT Short Term Goal 1 - Progress (Week 3): Met OT Short Term Goal 2 (Week 3): Pt will recall hemi dressing techniques with min questioning cues OT Short Term Goal 2 - Progress (Week 3): Met OT Short Term Goal 3 (Week 3): Patient will complete 1 step of UB dressing task OT Short Term Goal 3 - Progress (Week 3): Met Week 4:  OT Short Term Goal 1 (Week 4): LTG=STG 2.2 ELOS   Skilled Therapeutic Interventions/Progress Updates:    Pt greeted at time of session supine in bed resting with no pain at rest, but did have pain later in session with LUE PROM and rest breaks provided. Pt initially stating at beginning of session that he "does not feel like himself today" and hesitant to participate but willing to tolerate PROM to LUE during OT session. Focused on PROM at shoulder, elbow, forearm, and wrist (splint removed) within tolerance and static holds when able to decrease abnormal tone and prevent contractures. Skin in tact under splint, reapplied L resting hand splint and positioned pillow with L shoulder in slight abduction for continual stretch. Hand off to nursing for brief change/bladder scan.   Therapy Documentation Precautions:   Precautions Precautions: Fall Precaution Comments: L hemiparesis, L inattention, pusher to the L, cognitive deficits, cortrak Restrictions Weight Bearing Restrictions: No     Therapy/Group: Individual Therapy  Viona Gilmore 07/06/2021, 7:25 AM

## 2021-07-06 NOTE — Progress Notes (Signed)
Physical Therapy Session Note  Patient Details  Name: Donald [name] MRN: [id number] Date of Birth: 1956-09-10  Today's Date: 07/06/2021 PT Individual Time: 1140-1215 PT Individual Time Calculation (min): 35 min   Short Term Goals: Week 5:  PT Short Term Goal 1 (Week 5): STGs=LTGs  Skilled Therapeutic Interventions/Progress Updates:     Patient in TIS w/c upon PT arrival. Patient alert and agreeable to PT session. Patient reported 5-6/10 L hemibody pain with mobility during session, RN made aware. PT provided repositioning, rest breaks, and distraction as pain interventions throughout session. Patient requested to have a BM at beginning of session.  Therapeutic Activity: Bed Mobility: Patient performed sit to supine with max A for trunk and lower extremity management. Provided verbal cues for brining knees to chest and performing log roll technique for pain and tone management. Transfers: Patient performed sit to/from stand x2 at the sink to transition from w/c to [facility] with +2 assist for equipment management and safety with mod A and increased time to initiate. Provided verbal cues for hand placement, forward weight shift, initiation, hip/knee/trunk extension, and R weight shift due to progressive L lean with fatigue. Patient was unsuccessful with BM, perseverated on R hip pain on BSC despite several adjustments and padding seat with a towel. Patient performed stand pivot back to bed with max A of 1 and min A of a second person with increased flexed posturing. Provided cues for initiation and sequencing.   Performed prolonged progressive pressure to stretch L upper and lower extremity in bed to reduce increased flexor tone in supine. Patient relaxed with L arm elevated on a pillow and L WHO donned.   Reviewed features and breaking down and building up [place] tilt manual w/c for patient's wife. Educated on benefits of chair to reduce caregiver burden, improve patient's sitting positioning,  safety, and sitting tolerance at home, and opportunities for functional home mobility with progressing with HHPT. Patient's wife very pleased with this w/c. Measured patient's hips, patient will require a 16"x16" seat.   Patient in bed with his wife at beside at end of session with breaks locked, bed alarm set, and all needs within reach.   Therapy Documentation Precautions:  Precautions Precautions: Fall Precaution Comments: L hemiparesis, L inattention, pusher to the L, cognitive deficits, cortrak Restrictions Weight Bearing Restrictions: No General: PT Amount of Missed Time (min): 60 Minutes PT Missed Treatment Reason: Patient fatigue   Therapy/Group: Individual Therapy  Kadra Kohan L Tomma Ehinger PT, DPT  07/06/2021, 5:17 PM

## 2021-07-07 ENCOUNTER — Inpatient Hospital Stay (HOSPITAL_COMMUNITY): Payer: No Typology Code available for payment source

## 2021-07-07 LAB — URINE CULTURE: Culture: NO GROWTH

## 2021-07-07 LAB — CBC
HCT: 36.9 % — ABNORMAL LOW (ref 39.0–52.0)
Hemoglobin: 12.1 g/dL — ABNORMAL LOW (ref 13.0–17.0)
MCH: 31 pg (ref 26.0–34.0)
MCHC: 32.8 g/dL (ref 30.0–36.0)
MCV: 94.6 fL (ref 80.0–100.0)
Platelets: 229 10*3/uL (ref 150–400)
RBC: 3.9 MIL/uL — ABNORMAL LOW (ref 4.22–5.81)
RDW: 15 % (ref 11.5–15.5)
WBC: 13.2 10*3/uL — ABNORMAL HIGH (ref 4.0–10.5)
nRBC: 0 % (ref 0.0–0.2)

## 2021-07-07 LAB — BASIC METABOLIC PANEL
Anion gap: 11 (ref 5–15)
BUN: 18 mg/dL (ref 8–23)
CO2: 19 mmol/L — ABNORMAL LOW (ref 22–32)
Calcium: 9.5 mg/dL (ref 8.9–10.3)
Chloride: 102 mmol/L (ref 98–111)
Creatinine, Ser: 0.76 mg/dL (ref 0.61–1.24)
GFR, Estimated: >60 mL/min (ref >60)
Glucose, Bld: 111 mg/dL — ABNORMAL HIGH (ref 70–99)
Potassium: 4.2 mmol/L (ref 3.5–5.1)
Sodium: 132 mmol/L — ABNORMAL LOW (ref 135–145)

## 2021-07-07 LAB — GLUCOSE, CAPILLARY: Glucose-Capillary: 111 mg/dL — ABNORMAL HIGH (ref 70–99)

## 2021-07-07 NOTE — Progress Notes (Signed)
Physical Therapy Session Note  Patient Details  Name: Donald [name] MRN: [id number] Date of Birth: 1956-10-13  Today's Date: 07/07/2021 PT Individual Time: 8144-8185 PT Individual Time Calculation (min): 53 min   Short Term Goals: Week 5:  PT Short Term Goal 1 (Week 5): STGs=LTGs  Skilled Therapeutic Interventions/Progress Updates:     Pt received seated in [facility] and agrees to therapy. Reports pain in waist area as well as L leg. Number not provided. PT provides rest breaks and repositioning as needed to manage pain. Pt's HR assessed while resting at 110 BPM. WC transport to gym for time management. Pt requesting to "play basketball". Pt given ball in sitting and shoots at goal with R hand, working on attention and coordination. Pt then transitions to standing with maxA +2 and attempts shooting while standing. Pt is unable to handle ball with R hand while standing in order to shoot, but does attempt to dribble ball. PT switches soft ball for actual basketball, and pt then stands and dribbles ball with R hand x27 reps with PT providing modA +1 for stability. Pt takes seated rest break and performs standing dribbling 1 more bout with similar number of repetitions. Pt's HR assessed following activity at 149 bpm. WC transport back to room. Pt perform stand pivot to bed with maxA +1 and sit to supine with totalA. Left with alarm intact and all needs within reach. PT has discussion with wife regarding safety and PT recommendation for using medical transport to take pt home rather than attempting car transfer. Wife in agreement.  Therapy Documentation Precautions:  Precautions Precautions: Fall Precaution Comments: L hemiparesis, L inattention, pusher to the L, cognitive deficits, cortrak Restrictions Weight Bearing Restrictions: No   Therapy/Group: Individual Therapy  Beau Fanny, PT, DPT 07/07/2021, 4:11 PM

## 2021-07-07 NOTE — Progress Notes (Signed)
PROGRESS NOTE   Subjective/Complaints: Pt up in bed. Seems to be in better spirits today. Belly feels "warm". SLP at bedside.   ROS: Limited due to cognitive/behavioral    Objective:   DG Chest 2 View  Result Date: 07/06/2021 CLINICAL DATA:  Leukocytosis and abdominal pain. EXAM: CHEST - 2 VIEW COMPARISON:  06/05/2021. FINDINGS: Interval removal of feeding tube. Mediastinum and hilar structures normal. Lungs are clear. No pleural effusion or pneumothorax. Stable mild elevation left hemidiaphragm. Heart size normal. Degenerative change thoracic spine. IMPRESSION: Interval removal of feeding tube. Interim resolution of left base infiltrate. No acute cardiopulmonary disease. Electronically Signed   By: Maisie Fus  Register   On: 07/06/2021 05:17   DG Abd 1 View  Result Date: 07/06/2021 CLINICAL DATA:  Leukocytosis and abdominal pain. EXAM: ABDOMEN - 1 VIEW COMPARISON:  Abdomen 06/04/2021. FINDINGS: Interval removal of feeding tube. IVC filter noted stable position. Air-filled loops of small and large bowel noted with minimal distention. Findings suggest adynamic ileus. Oral contrast noted in the rectum. Degenerative changes lumbar spine and both hips. Pelvic calcifications consistent phleboliths. IMPRESSION: Air-filled loops of small and large bowel noted with minimal distention. Findings suggest adynamic ileus. Electronically Signed   By: Maisie Fus  Register   On: 07/06/2021 05:19   Recent Labs    07/06/21 0938 07/07/21 0507  WBC 14.7* 13.2*  HGB 12.9* 12.1*  HCT 39.6 36.9*  PLT 235 229      Recent Labs    07/06/21 0514 07/07/21 0507  NA 132* 132*  K 4.4 4.2  CL 100 102  CO2 21* 19*  GLUCOSE 121* 111*  BUN 23 18  CREATININE 0.85 0.76  CALCIUM 9.8 9.5        Intake/Output Summary (Last 24 hours) at 07/07/2021 0804 Last data filed at 07/07/2021 0100 Gross per 24 hour  Intake 460 ml  Output 200 ml  Net 260 ml          Physical Exam: Vital Signs Blood pressure (!) 113/91, pulse (!) 113, temperature 97.8 F (36.6 C), resp. rate 20, height 5\' 5"  (1.651 m), weight 60.9 kg, SpO2 100 %.    Constitutional: No distress . Vital signs reviewed. HEENT: EOMI, oral membranes moist Neck: supple Cardiovascular: RRR without murmur. No JVD    Respiratory/Chest: CTA Bilaterally without wheezes or rales. Normal effort    GI/Abdomen: BS more active, non-tender, non-distended Ext: no clubbing, cyanosis, 1+ LE edema Psych: pleasant and cooperative  Skin: No evidence of breakdown, no evidence of rash Neurological: alert- doesn't remember LBM.     Comments: left C7. STM deficits. Left inattention present. Dense left hemiparesis without change. RUE and RLE 4/5. Decreased LT Left side  ongoing. DTR's 3+ on left side. Musc: pain at left shoulder, wrist and thigh with ROM. Left shoulder tender/tight with ROM Tone: MAS 2-3 in the left bicep and finger flexors, no appreciable tone in the left lower limb detected although hip still seems tender with ROM    Assessment/Plan: 1. Functional deficits which require 3+ hours per day of interdisciplinary therapy in a comprehensive inpatient rehab setting. Physiatrist is providing close team supervision and 24 hour management of active medical problems listed  below. Physiatrist and rehab team continue to assess barriers to discharge/monitor patient progress toward functional and medical goals  Care Tool:  Bathing    Body parts bathed by patient: Chest, Abdomen, Face   Body parts bathed by helper: Right arm, Left arm, Front perineal area, Buttocks, Right upper leg, Left upper leg, Right lower leg, Left lower leg     Bathing assist Assist Level: Maximal Assistance - Patient 24 - 49%     Upper Body Dressing/Undressing Upper body dressing   What is the patient wearing?: Pull over shirt    Upper body assist Assist Level: Maximal Assistance - Patient 25 - 49%    Lower Body  Dressing/Undressing Lower body dressing      What is the patient wearing?: Pants, Incontinence brief     Lower body assist Assist for lower body dressing: Dependent - Patient 0%     Toileting Toileting    Toileting assist Assist for toileting: 2 Helpers     Transfers Chair/bed transfer  Transfers assist     Chair/bed transfer assist level: Dependent - Patient 0% (Stedy)     Locomotion Ambulation   Ambulation assist   Ambulation activity did not occur: Safety/medical concerns  Assist level: 2 helpers Assistive device: Other (comment) (3 Musketeer) Max distance: 46ft   Walk 10 feet activity   Assist  Walk 10 feet activity did not occur: Safety/medical concerns  Assist level: 2 helpers Assistive device:  (3 musketeers)   Walk 50 feet activity   Assist Walk 50 feet with 2 turns activity did not occur: Safety/medical concerns         Walk 150 feet activity   Assist Walk 150 feet activity did not occur: Safety/medical concerns         Walk 10 feet on uneven surface  activity   Assist Walk 10 feet on uneven surfaces activity did not occur: Safety/medical concerns         Wheelchair     Assist Will patient use wheelchair at discharge?: Yes Type of Wheelchair: Manual    Wheelchair assist level: Dependent - Patient 0% Max wheelchair distance: 150    Wheelchair 50 feet with 2 turns activity    Assist        Assist Level: Dependent - Patient 0%   Wheelchair 150 feet activity     Assist      Assist Level: Dependent - Patient 0%   Blood pressure (!) 113/91, pulse (!) 113, temperature 97.8 F (36.6 C), resp. rate 20, height 5\' 5"  (1.651 m), weight 60.9 kg, SpO2 100 %.    Medical Problem List and Plan: 1.  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              -patient may shower             -ELOS/Goals: have extended stay to 7/30 given medical issues.  Spoke with wife  -Continue CIR therapies including PT, OT, and SLP   2.  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 Wife prefers no oral anticoagulation given hematoma.  -DVT/anticoagulation:  Has IVCF -Mechanical:  Antiembolism stockings, knee (TED hose) Bilateral lower extremities -neurology feels risk of a/c outweighs any benefit             -antiplatelet therapy: N/A due to concerns of CAA 3. Low back pain, left hemiplegic shoulder, ?HO left hip, spasticity/neuropathic pain:   - continue lidocaine patch -gabapentin changed to 400  mg nightly on 7/23 d/t day time fatigue.   -kpad for back, tylenol prn -off prednisone, tizanidine -ROM/splinting as possible 4. Arousal:  improved  - ritalin  10mg  bid for arousal/initiaton             -antipsychotic agents: N/A 5. Neuropsych: This patient is not fully capable of making decisions on his own behalf. 6. Skin/Wound Care: Routine pressure relief measures. 7. Fluids/Electrolytes/Nutrition: monitor fluid intake while on PO alone  7/27--still not eating much   -K+ 4.2   -sodium holding 132   -BUN improving.    -continue IVF 8. Recurrent aspiration event:    -CXR recently stable, lungs clear 10. Blood pressure:  Vitals:   07/07/21 0203 07/07/21 0602  BP: 134/86 (!) 113/91  Pulse: (!) 116 (!) 113  Resp: 16 20  Temp: 98.1 F (36.7 C) 97.8 F (36.6 C)  SpO2: 100% 100%  Bp controlled. Tachy a little better 11. Progressive cognitive decline: To reschedule dementia work up at [facility] after d/c. 12. Hyperglycemia: Hgb A1C- 5.8.             --    CBG (last 3)  Recent Labs    07/05/21 0621 07/06/21 1213 07/07/21 0604  GLUCAP 107* 157* 111*   7/27 controlled  13. Dysphagia: . D2/thins diet initiated!.              -megace trial VERY beneficial begin weaning. Has app for food again!    14. Tachycardia: see above - monitor HR TID 15. Elevated liver transaminases: Liver u/s with one small cystic structure.   -all LFT's  nearly back wnl  -AP slightly elevated likely related to HO 16. Mild anemia-Ferritin elevated but Hgb, Fe++ and TIBC all low which point towards anemia of chronic disease.   17. Adynamic ileus  -don't see a lot of change on 7/27 kub, await reading  -pt does look better today  -continue reglan iv  -clear liquid diet, continue meds  -support volume, electrolytes as above 18. Leukocytosis: likely due to thrombi, reactive  -decr to 13k  -ucx still pending  -continue empiric ceftriaxone  =continue to follow clinically        LOS: 33 days A FACE TO FACE EVALUATION WAS PERFORMED  8/27 07/07/2021, 8:04 AM

## 2021-07-07 NOTE — Progress Notes (Signed)
Physical Therapy Session Note  Patient Details  Name: Donald [name] MRN: [id number] Date of Birth: 1956/11/11  Today's Date: 07/07/2021 PT Individual Time: 1105-1205 PT Individual Time Calculation (min): 60 min   Short Term Goals: Week 1:  PT Short Term Goal 1 (Week 1): Pt will perform bed mobiltiy with max assist  of 1. PT Short Term Goal 1 - Progress (Week 1): Met PT Short Term Goal 2 (Week 1): Pt will tolerate sitting in WC >2 hours PT Short Term Goal 2 - Progress (Week 1): Met PT Short Term Goal 3 (Week 1): Pt will maintain sitting balance EOB with max assist up to 2 minutes PT Short Term Goal 3 - Progress (Week 1): Met PT Short Term Goal 4 (Week 1): Pt will transfer to [facility] with max assist of 2 consistently PT Short Term Goal 4 - Progress (Week 1): Met Week 2:  PT Short Term Goal 1 (Week 2): Pt will perform bed<>WC transfer with max assist of 1 PT Short Term Goal 1 - Progress (Week 2): Met PT Short Term Goal 2 (Week 2): Pt will perform gait training at rail in hall x 51f with max A +2 PT Short Term Goal 2 - Progress (Week 2): Progressing toward goal PT Short Term Goal 3 (Week 2): Pt will perform WC propulsion [id number] mod assist using hemi technique PT Short Term Goal 3 - Progress (Week 2): Progressing toward goal PT Short Term Goal 4 (Week 2): Pt will perform sitting balance EOB with mod assist up to 5 minutes PT Short Term Goal 4 - Progress (Week 2): Progressing toward goal Week 3:  PT Short Term Goal 1 (Week 3): Pt will perform bed mobility with modA +1. PT Short Term Goal 1 - Progress (Week 3): Met PT Short Term Goal 2 (Week 3): Pt will perform sitting balance EOB with modA >5 minutes. PT Short Term Goal 2 - Progress (Week 3): Met PT Short Term Goal 3 (Week 3): Pt will perform gait training  x [id number] max A +2 PT Short Term Goal 3 - Progress (Week 3): Met PT Short Term Goal 4 (Week 3): Pt will perform WC propulsion [id number] mod assist using hemi technique PT Short Term  Goal 4 - Progress (Week 3): Progressing toward goal Week 4:  PT Short Term Goal 1 (Week 4): STGs = LTGs  Skilled Therapeutic Interventions/Progress Updates:   Pt initially supine, awake, agreeable to session.  Wife in room and participates somewhat in session.   Nursing assisted w/brief change/perineal care/total assist, pt max assist for rolling, very delayed motor planning/initiation. Wife requested we use brief she brought from home to assess for sizing when she purchases.  Therapist donned this brief and determined brief well sized for pt .  Total assist for lower body dressing. Supine to side to sit w/max assist. Pt leans moderately to L, instructed to reach for and hold to foot of bed and pt sits in this manner w/cga, able to transition to hands in lap and cga w/improved midline orientation. Wife assisted w/posiitoning of stedy and operation of brakes/seat paddles, pt Sit to stand w/mod assist/therapist to L of pt due to L lean. Pt then engaged in reaching towards wife on Pt R while standing in stedy to promote midline posture in standing.  Pt perfoms this mult times, standing activity approx 1 min intervals w/seated rest perched in stedy.  Pt transported to gym in stedy where session continued w/mirror for feedback.  Pt  engaged in reaching in perched position in stedy reaching for cones to R side and placing them in stack at midline.  Repeated mult rounds in both sitting and standing w/progress to stacking cones in stack overhead to R to encourage trunk extension + wt shift to R/deter L lean/allow for trunk elongation L, rotation of trunk, shoulder girdle, head to R.  Overall much improved midline orientation following activity.   Stedy to wc w/Stedy.  Pt positioned w/good midline posture and slightly reclined for safety.  Pt left oob in wc w/alarm belt set and needs in reach.  Wife at side.     Therapy Documentation Precautions:  Precautions Precautions: Fall Precaution Comments: L  hemiparesis, L inattention, pusher to the L, cognitive deficits, cortrak Restrictions Weight Bearing Restrictions: No    Therapy/Group: Individual Therapy Callie Fielding, Pablo Pena 07/07/2021, 3:13 PM

## 2021-07-07 NOTE — Progress Notes (Signed)
Speech Language Pathology Daily Session Note  Patient Details  Name: Donald [name] MRN: [id number] Date of Birth: [birth year]  Today's Date: 07/07/2021 SLP Individual Time: 5329-9242 SLP Individual Time Calculation (min): 45 min  Short Term Goals: Week 5: SLP Short Term Goal 1 (Week 5): STGs=LTGs due to ELOS  Skilled Therapeutic Interventions: Skilled treatment session focused on cognitive goals. Upon arrival, patient was awake and appeared to be mildly brighter compared to yesterday. Patient reported he was hungry but was unhappy with his current clear liquid diet. SLP provided extensive education regarding reasoning for current diet but patient required multiple reinforcement of information. Patient consumed minimal amounts of his clear liquid diet with oral holding noted. Patient reported this was due to a dry oral cavity. SLP provided set-up with a suction toothbrush and patient performed thorough oral care for more than a reasonable amount of time. Patient also apologized for previous behavior. Patient left upright in bed with alarm on and all needs within reach. Continue with current plan of care.      Pain Pain Assessment Pain Scale: 0-10 Pain Score: 0-No pain  Therapy/Group: Individual Therapy  Kristofor Michalowski 07/07/2021, 12:47 PM

## 2021-07-07 NOTE — Progress Notes (Signed)
Occupational Therapy Session Note  Patient Details  Name: Donald [name] MRN: [id number] Date of Birth: 1956-03-07  Today's Date: 07/07/2021 OT Individual Time: 1533-1600 OT Individual Time Calculation (min): 27 min    Short Term Goals: Week 1:  OT Short Term Goal 1 (Week 1): Pt will maintain static sitting balance with max A of 1 in prep for seated ADL. OT Short Term Goal 1 - Progress (Week 1): Met OT Short Term Goal 2 (Week 1): Pt will completed grooming task with mod A in supported sitting. OT Short Term Goal 2 - Progress (Week 1): Progressing toward goal OT Short Term Goal 3 (Week 1): Pt will don shirt with max A in supported sitting. OT Short Term Goal 3 - Progress (Week 1): Progressing toward goal OT Short Term Goal 4 (Week 1): Pt will maintain midline orientation in supported sitting with overall mod A. OT Short Term Goal 4 - Progress (Week 1): Progressing toward goal  Skilled Therapeutic Interventions/Progress Updates:     Pt received in bed with wife present and pt reporting pain in RLE. Pt provided with ice pack at end of session for pain and wife present to monitor/time.   Therapeutic activity Pt completes sitting balance at EOB with max-min A for static anddynamic sitting balance with little to no righting reactions/protective responses present for L/posterior leaning. Pt completes seated reaching task for bean bags with MAX A overall for initiation of movment and attention.  Pt left at end of session in bed with exit alarm on, call light in reach and all needs met   Therapy Documentation Precautions:  Precautions Precautions: Fall Precaution Comments: L hemiparesis, L inattention, pusher to the L, cognitive deficits, cortrak Restrictions Weight Bearing Restrictions: No General:   Vital Signs: Therapy Vitals Temp: 97.8 F (36.6 C) Pulse Rate: (!) 113 Resp: 20 BP: (!) 113/91 Patient Position (if appropriate): Lying Oxygen Therapy SpO2: 100 % O2 Device:  Room Air Pain:   ADL: ADL Eating: NPO Grooming: Dependent Upper Body Bathing: Dependent Where Assessed-Upper Body Bathing: Bed level Lower Body Bathing: Dependent Where Assessed-Lower Body Bathing: Bed level Upper Body Dressing: Dependent Where Assessed-Upper Body Dressing: Bed level Lower Body Dressing: Dependent Where Assessed-Lower Body Dressing: Bed level Toileting: Dependent Where Assessed-Toileting: Bed level Toilet Transfer: Not assessed Tub/Shower Transfer: Not assessed Gaffer Transfer: Not assessed Vision   Perception    Praxis   Exercises:   Other Treatments:     Therapy/Group: Individual Therapy  Tonny Branch 07/07/2021, 6:54 AM

## 2021-07-07 NOTE — Progress Notes (Addendum)
Patient ID: Donald [name], male   DOB: [birth year], 65 y.o.   MRN: [id number]  07/06/2021- SW met with pt and pt wife in room to provide updates from team conference, and d/c date 7/29 due to medical concerns. [facility]HA preference: [facility] and [facility]. Wife aware on transportation options available for pt. States she is likely to use a private transportation company. SW just requested pick up time so nursing staff can be made aware.   07/07/2021- SW sent HHPT/OT/SLP/SN/aide/SW referral to [facility] and waiting on follow-up. [facility] reports they are able to get pt the tiltnspace w/c needed. SW will confirm when it will be delivered.    Loralee Pacas, MSW, [place] Office: [phone] Cell: [phone] Fax: [phone]

## 2021-07-08 LAB — GLUCOSE, CAPILLARY: Glucose-Capillary: 95 mg/dL (ref 70–99)

## 2021-07-08 NOTE — Progress Notes (Signed)
Occupational Therapy Session Note  Patient Details  Name: Toris Laverdiere MRN: [id number] Date of Birth: 08/08/1956  Today's Date: 07/08/2021 OT Individual Time: 1404-1430 OT Individual Time Calculation (min): 26 min   Short Term Goals: Week 4:  OT Short Term Goal 1 (Week 4): LTG=STG 2.2 ELOS  Skilled Therapeutic Interventions/Progress Updates:    Pt greeted sidelying in bed with stool leaking from bottom and incontinent of urine as well. Worked on rolling using rolling assist device patients spouse bought. Max A to roll L and total A to roll R. Total A peri-care and brief change. Educated on importance of ROM of L UE and provided gentle stretching ROM, and supported position of L UE. Pt left semi-reclined in bed with bed alarm on, call bell in reach and needs met.   Therapy Documentation Precautions:  Precautions Precautions: Fall Precaution Comments: L hemiparesis, L inattention, pusher to the L, cognitive deficits, cortrak Restrictions Weight Bearing Restrictions: No  Pain:   Pt reports pain in L UE when repositioned and with ROM. Rest and repositioned for comfort.  Therapy/Group: Individual Therapy  Valma Cava 07/08/2021, 2:54 PM

## 2021-07-08 NOTE — Progress Notes (Signed)
PROGRESS NOTE   Subjective/Complaints: Pt in bed listening to gospel verses/music. In better spirits. Says he feels better today  ROS: Limited due to cognitive/behavioral    Objective:   DG Abd 1 View  Result Date: 07/07/2021 CLINICAL DATA:  Ileus EXAM: ABDOMEN - 1 VIEW COMPARISON:  Abdominal radiograph 07/05/2021 . FINDINGS: Gas is again noted throughout multiple loops of small bowel. No evidence of distension. Persistent and unchanged contrast material outlining a large stool ball in the rectum. The stool ball measures approximately 7.0 x 8.4 cm. [place] potentially retrievable IVC filter projects over the expected location. No acute osseous abnormality IMPRESSION: 1. Oral contrast material outlines a large 8.4 cm stool ball in the rectum. Findings are concerning for rectal fecal impaction. 2. Small gas noted throughout the nondilated small bowel consistent with ileus. Electronically Signed   By: Malachy Moan M.D.   On: 07/07/2021 09:18   Recent Labs    07/06/21 0938 07/07/21 0507  WBC 14.7* 13.2*  HGB 12.9* 12.1*  HCT 39.6 36.9*  PLT 235 229      Recent Labs    07/06/21 0514 07/07/21 0507  NA 132* 132*  K 4.4 4.2  CL 100 102  CO2 21* 19*  GLUCOSE 121* 111*  BUN 23 18  CREATININE 0.85 0.76  CALCIUM 9.8 9.5        Intake/Output Summary (Last 24 hours) at 07/08/2021 0921 Last data filed at 07/08/2021 0810 Gross per 24 hour  Intake 660 ml  Output --  Net 660 ml         Physical Exam: Vital Signs Blood pressure 139/88, pulse (!) 109, temperature 97.9 F (36.6 C), resp. rate 18, height 5\' 5"  (1.651 m), weight 61.7 kg, SpO2 100 %.    Constitutional: No distress . Vital signs reviewed. HEENT: EOMI, oral membranes moist Neck: supple Cardiovascular: RRR without murmur. No JVD    Respiratory/Chest: CTA Bilaterally without wheezes or rales. Normal effort    GI/Abdomen: BS more +, non-tender,  non-distended Ext: no clubbing, cyanosis, or edema Psych: pleasant and cooperative  Skin: No evidence of breakdown, no evidence of rash Neurological:  alert, stm deficits. Speech/voice louder.     Comments: left C7. STM deficits. Left inattention present. Dense left hemiparesis without change. RUE and RLE 4/5. Decreased LT Left side  ongoing. DTR's 3+ on left side. Musc: pain at left shoulder, wrist and thigh with ROM. Left shoulder tender/tight with ROM Tone: MAS 2-3 in the left bicep and finger flexors, no appreciable tone in the left lower limb detected although hip still seems tender with ROM--no change    Assessment/Plan: 1. Functional deficits which require 3+ hours per day of interdisciplinary therapy in a comprehensive inpatient rehab setting. Physiatrist is providing close team supervision and 24 hour management of active medical problems listed below. Physiatrist and rehab team continue to assess barriers to discharge/monitor patient progress toward functional and medical goals  Care Tool:  Bathing    Body parts bathed by patient: Chest, Abdomen, Face, Right upper leg, Left upper leg, Left arm   Body parts bathed by helper: Right arm, Buttocks, Left lower leg, Right lower leg, Front perineal area  Bathing assist Assist Level: Maximal Assistance - Patient 24 - 49%     Upper Body Dressing/Undressing Upper body dressing   What is the patient wearing?: Pull over shirt    Upper body assist Assist Level: Moderate Assistance - Patient 50 - 74%    Lower Body Dressing/Undressing Lower body dressing      What is the patient wearing?: Pants, Incontinence brief     Lower body assist Assist for lower body dressing: Total Assistance - Patient < 25%     Toileting Toileting    Toileting assist Assist for toileting: Maximal Assistance - Patient 25 - 49%     Transfers Chair/bed transfer  Transfers assist     Chair/bed transfer assist level: Total Assistance -  Patient < 25%     Locomotion Ambulation   Ambulation assist   Ambulation activity did not occur: Safety/medical concerns  Assist level: 2 helpers Assistive device: Other (comment) (3 Musketeer) Max distance: 64ft   Walk 10 feet activity   Assist  Walk 10 feet activity did not occur: Safety/medical concerns  Assist level: 2 helpers Assistive device:  (3 musketeers)   Walk 50 feet activity   Assist Walk 50 feet with 2 turns activity did not occur: Safety/medical concerns         Walk 150 feet activity   Assist Walk 150 feet activity did not occur: Safety/medical concerns         Walk 10 feet on uneven surface  activity   Assist Walk 10 feet on uneven surfaces activity did not occur: Safety/medical concerns         Wheelchair     Assist Will patient use wheelchair at discharge?: Yes Type of Wheelchair: Manual    Wheelchair assist level: Dependent - Patient 0% Max wheelchair distance: 150    Wheelchair 50 feet with 2 turns activity    Assist        Assist Level: Dependent - Patient 0%   Wheelchair 150 feet activity     Assist      Assist Level: Dependent - Patient 0%   Blood pressure 139/88, pulse (!) 109, temperature 97.9 F (36.6 C), resp. rate 18, height 5\' 5"  (1.651 m), weight 61.7 kg, SpO2 100 %.    Medical Problem List and Plan: 1.  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              -patient may shower             -ELOS/Goals: have extended stay to 7/30 given medical issues.     -will request palliative care consult to help wife/family with community resources/care needs  2.  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 Wife prefers no oral anticoagulation given hematoma.  -DVT/anticoagulation:  Has IVCF -Mechanical:  Antiembolism stockings, knee (TED hose) Bilateral lower extremities -neurology feels risk of a/c outweighs any benefit              -antiplatelet therapy: N/A due to concerns of CAA 3. Low back pain, left hemiplegic shoulder, ?HO left hip, spasticity/neuropathic pain:   - continue lidocaine patch -gabapentin changed to 400 mg nightly on 7/23 d/t day time fatigue.   -kpad for back, tylenol prn -off prednisone, tizanidine -ROM/splinting as possible 4. Arousal:  improved  - ritalin  10mg  bid for arousal/initiaton             -antipsychotic agents: N/A 5. Neuropsych: This patient is not fully  capable of making decisions on his own behalf. 6. Skin/Wound Care: Routine pressure relief measures. 7. Fluids/Electrolytes/Nutrition: monitor fluid intake while on PO alone  7/28--    -K+ 4.2   -sodium holding 132   -BUN improving.    -continue ivf   -check labs tomorrow 8. Recurrent aspiration event:    -CXR recently stable, lungs clear 10. Blood pressure:  Marland Kitchen Vitals:   07/07/21 1817 07/08/21 0528  BP: 121/79 139/88  Pulse: (!) 110 (!) 109  Resp: 18 18  Temp: 98.3 F (36.8 C) 97.9 F (36.6 C)  SpO2: 100% 100%  Bp controlled. Tachy slowly improving 11. Progressive cognitive decline: To reschedule dementia work up at [facility] after d/c. 12. Hyperglycemia: Hgb A1C- 5.8.             --    CBG (last 3)  Recent Labs    07/06/21 1213 07/07/21 0604 07/08/21 0555  GLUCAP 157* 111* 95   7/28 controlled  13. Dysphagia: . D2/thins diet --resume.              -megace      14. Tachycardia: see above - monitor HR TID 15. Elevated liver transaminases: Liver u/s with one small cystic structure.   -all LFT's nearly back wnl  -AP slightly elevated likely related to HO 16. Mild anemia-Ferritin elevated but Hgb, Fe++ and TIBC all low which point towards anemia of chronic disease.   17. Adynamic ileus  -pt does look better today  -continue reglan iv today  -advance diet to regular  -support volume, electrolytes as above 18. Leukocytosis: likely due to thrombi, reactive  -decr to 13k  -ucx negative  -dc  ceftriaxone  =check cbc tomorrow        LOS: 34 days A FACE TO FACE EVALUATION WAS PERFORMED  Ranelle Oyster 07/08/2021, 9:21 AM

## 2021-07-08 NOTE — Progress Notes (Signed)
Pt in bed upon arrival, spouse present. Pt updated on need for enema. Procedure explained. Pt placed on left side. SSE enema administered. NT present. No void after 15 mins. Spouse/pt instructed to contact staff when pt has bm. Spouse verbalized understanding.

## 2021-07-08 NOTE — Progress Notes (Addendum)
Patient ID: Donald [name], male   DOB: 1956-12-11, 65 y.o.   MRN: [id number]  SW received updates from [facility]yl/[facility] able to accept HHPT/OT/SLP/aide/SN/SW referral.  SW received updates from [facility] that they are working on getting the tiltnspace w/c and pt will d/c with the standard w/c.   SW met with pt wife to inform on above. Pt wife spoke with [facility] and understands there will be f/u with the tiltnspace w/c will be delivered. Pt wife will f/u with SW about scheduled transportation time for his p/u.  *Per attending, pt will d/c on Monday (8/1) due to medical reasons. Pt will be switched to qd for therapy. SW updated medical team. SW met with pt wife in room to discuss above. Pt wife spoke with attending with regard to delay in discharge. Pt wife also agreeable to palliative care referral. Wife will arrange transportation for late afternoon on Monday. Reports the stedy arrived, and she will get assistance from son and brother to assemble.   SW updated [facility] on change in d/c date.  *SW received updates from [facility] they are OOS of 16" w/c. Pt will receive an 18x16 until the tiltnspace w/c arrives.   Loralee Pacas, MSW, [place] Office: [phone] Cell: [phone] Fax: [phone]

## 2021-07-08 NOTE — Progress Notes (Signed)
Speech Language Pathology Daily Session Note  Patient Details  Name: Donald [name] MRN: [id number] Date of Birth: Apr 24, 1956  Today's Date: 07/08/2021 SLP Individual Time: 0900-0945 SLP Individual Time Calculation (min): 45 min  Short Term Goals: Week 5: SLP Short Term Goal 1 (Week 5): STGs=LTGs due to ELOS  Skilled Therapeutic Interventions: Skilled treatment session focused on cognitive and communication goals. SLP facilitated session by providing Mod A multimodal cues for use of an increased vocal intensity to achieve ~75% intelligibility at the phrase level. Patient with language of confusion throughout session and requesting to open a business "like this one" with SLP. SLP provided education and reorientation. Patient requesting to brush his teeth and performed task with set-up assist and Min verbal cues for cessation of task. Patient's wife present and all questions were answered to her satisfaction regarding current diet and recommendations for positioning. Patient left upright in bed with alarm on and all needs within reach. Continue with current plan of care.      Pain Patient reports mild back pain, patient repositioned   Therapy/Group: Individual Therapy  Mardel Grudzien 07/08/2021, 1:37 PM

## 2021-07-08 NOTE — Progress Notes (Signed)
Occupational Therapy Weekly Progress Note  Patient Details  Name: Donald [name] MRN: [id number] Date of Birth: 28-Sep-1956  Beginning of progress report period: June 05, 2021 End of progress report period: July 08, 2021  Today's Date: 07/08/2021 OT Individual Time: 7253-6644 OT Individual Time Calculation (min): 28 min  and Today's Date: 07/08/2021 OT Missed Time: 32 Minutes Missed Time Reason: Nursing care (Pt to get enema)  Patient is working towards discharge. Short term goals not set due to estimated length of stay. Patients discharge was pushed back due to medical issues including new ileus and stool impaction. This week has been focused on family education with family demonstrating good understanding of care needs. We are continuing family education in preparation for dc home hopefully in the beginning of the week.  Patient continues to demonstrate the following deficits: muscle weakness and muscle joint tightness, decreased cardiorespiratoy endurance, impaired timing and sequencing, abnormal tone, unbalanced muscle activation, motor apraxia, ataxia, decreased coordination, and decreased motor planning, decreased midline orientation, decreased attention to left, left side neglect, and decreased motor planning, decreased initiation, decreased attention, decreased awareness, decreased problem solving, decreased safety awareness, decreased memory, and delayed processing, and decreased sitting balance, decreased standing balance, decreased postural control, hemiplegia, and decreased balance strategies and therefore will continue to benefit from skilled OT intervention to enhance overall performance with BADL and Reduce care partner burden.  See Patient's Care Plan for progression toward long term goals.  Patient progressing toward long term goals..  Continue plan of care.  Skilled Therapeutic Interventions/Progress Updates:    Patient greeted siting in [place] with spouse present. Reported pt  needed to get back in the bed for enema and bowel disimpaction. Pt's spouse showed OT the device she bought to assist with bed mobility and rolling for hygiene. OT also reviewed Stedy transfer and discussed positioning in bed. Pt needed max A for stand-pivot transfer from wc back to bed. +2 to pull patient up in bed using bed functions. Educated on importance of positioning Stedy high enough in the bed to decrease need to pull patient up, as he will not have a hospital bed. OT provided gentle stretching and ROM to L UE, but pt unable to tolerate much ROM, with very tight flexor tone. Educated on SAEBO e-stim positions and provided spouse with Rx from from [facility]. OT placed SAEBO e-stim on wrist extensors. SAEBO left on for 60 minutes. OT returned to remove SAEBO with skin intact and no adverse reactions.  [place] Stim One 330 pulse width 35 Hz pulse rate On 8 sec/ off 8 sec Ramp up/ down 2 sec Symmetrical Biphasic wave form  Max intensity 185m at 500 Ohm load  Pt left semi-reclined in bed with bed alarm on, call bell in reach, and needs met.   Therapy Documentation Precautions:  Precautions Precautions: Fall Precaution Comments: L hemiparesis, L inattention, pusher to the L, cognitive deficits, cortrak Restrictions Weight Bearing Restrictions: No General: General OT Amount of Missed Time: 32 Minutes Pain: Patient reports pain in L UE when OT providing ROM. Rest and repositioned for comfort.    Therapy/Group: Individual Therapy  EValma Cava7/28/2022, 11:46 AM

## 2021-07-08 NOTE — Progress Notes (Signed)
Physical Therapy Session Note  Patient Details  Name: Donald [name] MRN: [id number] Date of Birth: September 11, 1956  Today's Date: 07/08/2021 PT Individual Time: 4332-9518 PT Individual Time Calculation (min): 41 min   Short Term Goals: Week 5:  PT Short Term Goal 1 (Week 5): STGs=LTGs  Skilled Therapeutic Interventions/Progress Updates:     Pt received supine in bed and agrees to therapy. Wife present for family education. Wife shows PT "pt turning device" which she had purchased for home use. PT practices using device to help turn pt in bed and problem solves with wife, and with pt reporting good comfort with use. Pt performs supine to sit with maxA. Stand pivot transfer to [facility] with modA and pt maintaining standing ~1 minute during transfer to work on strengthening in bilateral lower extremities. WC transport to gym. Wife attempts to assist pt with sit to stand transfer using Stedy. Pt is not able to complete and appears to be providing little initiation and effort, likely associated with pt's fatigue. PT demonstrates alternate hand placement on pt's trunk to perform transfer and pt completes sit to stand with Stedy and heavy maxA. Wife then assists with sit to stand from elevated platform height and return to [facility]. Pt left seated in WC with alarm intact and all needs within reach.  Therapy Documentation Precautions:  Precautions Precautions: Fall Precaution Comments: L hemiparesis, L inattention, pusher to the L, cognitive deficits, cortrak Restrictions Weight Bearing Restrictions: No   Therapy/Group: Individual Therapy  Beau Fanny, PT, DPT 07/08/2021, 12:50 PM

## 2021-07-09 LAB — BASIC METABOLIC PANEL
Anion gap: 8 (ref 5–15)
BUN: 11 mg/dL (ref 8–23)
CO2: 21 mmol/L — ABNORMAL LOW (ref 22–32)
Calcium: 9.3 mg/dL (ref 8.9–10.3)
Chloride: 105 mmol/L (ref 98–111)
Creatinine, Ser: 0.68 mg/dL (ref 0.61–1.24)
GFR, Estimated: >60 mL/min (ref >60)
Glucose, Bld: 91 mg/dL (ref 70–99)
Potassium: 4.3 mmol/L (ref 3.5–5.1)
Sodium: 134 mmol/L — ABNORMAL LOW (ref 135–145)

## 2021-07-09 LAB — CBC
HCT: 35.3 % — ABNORMAL LOW (ref 39.0–52.0)
Hemoglobin: 11.4 g/dL — ABNORMAL LOW (ref 13.0–17.0)
MCH: 30.6 pg (ref 26.0–34.0)
MCHC: 32.3 g/dL (ref 30.0–36.0)
MCV: 94.6 fL (ref 80.0–100.0)
Platelets: 289 10*3/uL (ref 150–400)
RBC: 3.73 MIL/uL — ABNORMAL LOW (ref 4.22–5.81)
RDW: 15.3 % (ref 11.5–15.5)
WBC: 8.8 10*3/uL (ref 4.0–10.5)
nRBC: 0 % (ref 0.0–0.2)

## 2021-07-09 LAB — GLUCOSE, CAPILLARY: Glucose-Capillary: 81 mg/dL (ref 70–99)

## 2021-07-09 MED ORDER — METOCLOPRAMIDE HCL 5 MG PO TABS
10.0000 mg | ORAL_TABLET | Freq: Three times a day (TID) | ORAL | Status: DC
  Administered 2021-07-09 – 2021-07-12 (×11): 10 mg via ORAL
  Filled 2021-07-09 (×11): qty 2

## 2021-07-09 MED ORDER — METHYLPHENIDATE HCL 5 MG PO TABS
5.0000 mg | ORAL_TABLET | Freq: Two times a day (BID) | ORAL | Status: DC
  Administered 2021-07-10 – 2021-07-12 (×5): 5 mg via ORAL
  Filled 2021-07-09 (×5): qty 1

## 2021-07-09 NOTE — Plan of Care (Signed)
Pt's plan of care adjusted to QD after speaking with care team and discussed with MD in team conference.

## 2021-07-09 NOTE — Progress Notes (Signed)
Speech Language Pathology Weekly Progress and Session Note  Patient Details  Name: Donald [name] MRN: [id number] Date of Birth: May 21, 1956  Beginning of progress report period: July 01, 2021 End of progress report period: July 09, 2021  Today's Date: 07/09/2021 SLP Individual Time: 0815-0900 SLP Individual Time Calculation (min): 45 min  Short Term Goals: Week 5: SLP Short Term Goal 1 (Week 5): STGs=LTGs due to ELOS SLP Short Term Goal 1 - Progress (Week 5): Progressing toward goal    New Short Term Goals: Week 6: SLP Short Term Goal 1 (Week 6): STGs=LTGs due to ELOS  Weekly Progress Updates: Patient continues to make slow progress towards LTGs but patient's overall participation and d/c plan has been impacted by ongoing medical issues. Because of this, patient's discharge date has been extended. Patient and family education ongoing. Patient remains on Dys. 2 textures with thin liquids with minimal overt s/s of aspiration and requires overall Max A verbal cues for recall of daily information and Min A verbal cues for attention and use of speech intelligibility strategies at the phrase and sentence level. Patient would benefit from continued skilled SLP intervention to maximize his overall cognitive and swallowing function prior to d/c home.      Intensity: Minumum of 1-2 x/day, 30 to 90 minutes Frequency: 3 to 5 out of 7 days Duration/Length of Stay: 07/12/21 Treatment/Interventions: Cognitive remediation/compensation;Dysphagia/aspiration precaution training;Internal/external aids;Speech/Language facilitation;Environmental controls;Cueing hierarchy;Functional tasks;Patient/family education;Therapeutic Activities   Daily Session  Skilled Therapeutic Interventions:   Skilled treatment session focused on dysphagia and cognitive goals. SLP facilitated session by providing set-up assist with breakfast meal of Dys. 2 textures with thin liquids. Patient without overt s/s of aspiration but  required Min verbal cues to minimize oral holding. Recommend patient continue current diet. Patient insisted that he had eaten all of his meal but when cued, was able to see that the entire left side of the plate was full. Patient required overall Mod A verbal cues to attend to self-feeding due to requesting multiple things/tasks. Language of confusion was also noted. Patient left upright in bed with alarm on and all needs within reach. Continue with current plan of care.      Pain No/Denies Pain   Therapy/Group: Individual Therapy  [name] 07/09/2021, 6:22 AM

## 2021-07-09 NOTE — Progress Notes (Signed)
PROGRESS NOTE   Subjective/Complaints: Pt sitting up in bed. In good spirits. Says he feels bette. Has had 2 bm's since yesterday. Eating a little.   ROS: Limited due to cognitive/behavioral    Objective:   No results found. Recent Labs    07/07/21 0507 07/09/21 0459  WBC 13.2* 8.8  HGB 12.1* 11.4*  HCT 36.9* 35.3*  PLT 229 289      Recent Labs    07/07/21 0507 07/09/21 0459  NA 132* 134*  K 4.2 4.3  CL 102 105  CO2 19* 21*  GLUCOSE 111* 91  BUN 18 11  CREATININE 0.76 0.68  CALCIUM 9.5 9.3        Intake/Output Summary (Last 24 hours) at 07/09/2021 1145 Last data filed at 07/09/2021 1047 Gross per 24 hour  Intake 4586.01 ml  Output --  Net 4586.01 ml         Physical Exam: Vital Signs Blood pressure 124/83, pulse (!) 109, temperature 98.4 F (36.9 C), resp. rate 19, height 5\' 5"  (1.651 m), weight 62.1 kg, SpO2 100 %.    Constitutional: No distress . Vital signs reviewed. HEENT: EOMI, oral membranes moist Neck: supple Cardiovascular: RRR without murmur. No JVD    Respiratory/Chest: CTA Bilaterally without wheezes or rales. Normal effort    GI/Abdomen: BS +, non-tender, non-distended Ext: no clubbing, cyanosis, or edema Psych: pleasant and cooperative  Skin: No evidence of breakdown, no evidence of rash Neurological:  alert, stm deficits. Speech/voice louder.     Comments: left C7. STM deficits. Left inattention present. Dense left hemiparesis without change. RUE and RLE 4/5. Decreased LT Left side  ongoing. DTR's 3+ on left side. Musc: pain at left shoulder, wrist and thigh with ROM. Left shoulder tender/tight with ROM Tone: MAS 2-3 in the left bicep and finger flexors.    Assessment/Plan: 1. Functional deficits which require 3+ hours per day of interdisciplinary therapy in a comprehensive inpatient rehab setting. Physiatrist is providing close team supervision and 24 hour management of  active medical problems listed below. Physiatrist and rehab team continue to assess barriers to discharge/monitor patient progress toward functional and medical goals  Care Tool:  Bathing    Body parts bathed by patient: Chest, Abdomen, Face, Right upper leg, Left upper leg, Left arm   Body parts bathed by helper: Right arm, Buttocks, Left lower leg, Right lower leg, Front perineal area     Bathing assist Assist Level: Maximal Assistance - Patient 24 - 49%     Upper Body Dressing/Undressing Upper body dressing   What is the patient wearing?: Pull over shirt    Upper body assist Assist Level: Moderate Assistance - Patient 50 - 74%    Lower Body Dressing/Undressing Lower body dressing      What is the patient wearing?: Pants, Incontinence brief     Lower body assist Assist for lower body dressing: Total Assistance - Patient < 25%     Toileting Toileting    Toileting assist Assist for toileting: Maximal Assistance - Patient 25 - 49%     Transfers Chair/bed transfer  Transfers assist     Chair/bed transfer assist level: Total Assistance - Patient <  25%     Locomotion Ambulation   Ambulation assist   Ambulation activity did not occur: Safety/medical concerns  Assist level: 2 helpers Assistive device: Other (comment) (3 Musketeer) Max distance: 67ft   Walk 10 feet activity   Assist  Walk 10 feet activity did not occur: Safety/medical concerns  Assist level: 2 helpers Assistive device:  (3 musketeers)   Walk 50 feet activity   Assist Walk 50 feet with 2 turns activity did not occur: Safety/medical concerns         Walk 150 feet activity   Assist Walk 150 feet activity did not occur: Safety/medical concerns         Walk 10 feet on uneven surface  activity   Assist Walk 10 feet on uneven surfaces activity did not occur: Safety/medical concerns         Wheelchair     Assist Will patient use wheelchair at discharge?: Yes Type  of Wheelchair: Manual    Wheelchair assist level: Dependent - Patient 0% Max wheelchair distance: 150    Wheelchair 50 feet with 2 turns activity    Assist        Assist Level: Dependent - Patient 0%   Wheelchair 150 feet activity     Assist      Assist Level: Dependent - Patient 0%   Blood pressure 124/83, pulse (!) 109, temperature 98.4 F (36.9 C), resp. rate 19, height 5\' 5"  (1.651 m), weight 62.1 kg, SpO2 100 %.    Medical Problem List and Plan: 1.  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              -patient may shower             -ELOS/Goals: have extended stay to 7/30 given medical issues.     -have requested palliative care consult to help wife/family with community resources/care needs/medical decision making  2.  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 Wife prefers no oral anticoagulation given hematoma.  -DVT/anticoagulation:  Has IVCF -Mechanical:  Antiembolism stockings, knee (TED hose) Bilateral lower extremities -neurology feels risk of a/c outweighs any benefit             -antiplatelet therapy: N/A due to concerns of CAA 3. Low back pain, left hemiplegic shoulder, ?HO left hip, spasticity/neuropathic pain:   - continue lidocaine patch -gabapentin changed to 400 mg nightly on 7/23 d/t day time fatigue.   -kpad for back, tylenol prn -off prednisone. Tizanidine stopped d/t sedation -ROM/splinting as possible 4. Arousal:  improved  - ritalin  10mg  bid for arousal/initiaton             -antipsychotic agents: N/A 5. Neuropsych: This patient is not fully capable of making decisions on his own behalf. 6. Skin/Wound Care: Routine pressure relief measures. 7. Fluids/Electrolytes/Nutrition: monitor fluid intake while on PO alone  7/29-    -K+ 4.2   -sodium up to 134   -BUN improved    -DC IVF   -push po   -recheck labs monday 8. Recurrent aspiration event:    -CXR  recently stable, lungs clear 10. Blood pressure:  Vitals:   07/08/21 1927 07/09/21 0426  BP: 117/77 124/83  Pulse: (!) 110 (!) 109  Resp: 19 19  Temp: 98.3 F (36.8 C) 98.4 F (36.9 C)  SpO2: 98% 100%  Bp controlled. 11. Progressive cognitive decline: To reschedule dementia work up at [facility] after d/c. 12. Hyperglycemia:  Hgb A1C- 5.8.             --    CBG (last 3)  Recent Labs    07/07/21 0604 07/08/21 0555 07/09/21 0607  GLUCAP 111* 95 81   7/28 controlled  13. Dysphagia: . D2/thins diet --resume.              -megace      14. Tachycardia: will not treat unless symptomatic. Has improved with hydration - monitor HR TID 15. Elevated liver transaminases: Liver u/s with one small cystic structure.   -all LFT's nearly back wnl  -AP slightly elevated likely related to HO 16. Mild anemia-Ferritin elevated but Hgb, Fe++ and TIBC all low which point towards anemia of chronic disease.   17. Adynamic ileus  -7/29 pt demonstrating improvement. Has moved bowels  -change reglan to PO  - diet  advanced to D2  -support volume, electrolytes as above 18. Leukocytosis: likely due to thrombi, reactive  -down to 8 7/29  -ucx negative  -off ceftriaxone  -f/u monday        LOS: 35 days A FACE TO FACE EVALUATION WAS PERFORMED  Ranelle Oyster 07/09/2021, 11:45 AM

## 2021-07-09 NOTE — Progress Notes (Signed)
Physical Therapy Session Note  Patient Details  Name: Jadian Karman MRN: [id number] Date of Birth: 08-Jul-1956  Today's Date: 07/09/2021 PT Individual Time: 6213-0865 PT Individual Time Calculation (min): 16 min   Short Term Goals: Week 5:  PT Short Term Goal 1 (Week 5): STGs=LTGs  Skilled Therapeutic Interventions/Progress Updates:     Pt received seated in WC and requests to rest at this time due to fatigue. Per wife, pt als has elevated HR. PT assesses and pt noted to be at 124 bpm at rest. Pt then requesting to return to bed. PT provides multimodal cueing for body mechanics and sequencing of stand pivot transfer back to bed, but pt resists and says that he wants to perform transfer himself. PT educates on safety issues. Pt then performs stand pivot with maxA. Sit to supine with totalA. Left supine with alarm intact and all needs within reach.  Therapy Documentation Precautions:  Precautions Precautions: Fall Precaution Comments: L hemiparesis, L inattention, pusher to the L, cognitive deficits, cortrak Restrictions Weight Bearing Restrictions: No   Therapy/Group: Individual Therapy  Beau Fanny, PT, DPT 07/09/2021, 4:43 PM

## 2021-07-09 NOTE — Progress Notes (Signed)
Occupational Therapy Session Note  Patient Details  Name: Donald [name] MRN: [id number] Date of Birth: November 13, 1956  Today's Date: 07/09/2021 OT Individual Time: 1200-1230 OT Individual Time Calculation (min): 30 min   Short Term Goals: Week 4:  OT Short Term Goal 1 (Week 4): LTG=STG 2.2 ELOS  Skilled Therapeutic Interventions/Progress Updates:    Pt greeted semi-reclined in bed and agreeable to OT treatment session. Total A to thread pant legs, then max A to roll L and total A to roll R to pull pants up over hips. Total A for bed mobility, then worked on sitting balance at EOB with max A, progressing to min/mod A verbal and tactile cues. Stand-pivot to TIS wc with pt pushing away from therapist and R LE kicking out under wc due to pushing. Pt completed UB bathing/dressing with max verbal cues to initiate bathing and mod/max A overall. Pt left seated in TIS wc with alarm belt on, spouse present, and needs met.   Therapy Documentation Precautions:  Precautions Precautions: Fall Precaution Comments: L hemiparesis, L inattention, pusher to the L, cognitive deficits, cortrak Restrictions Weight Bearing Restrictions: No Pain: Pain Assessment Pain Scale: 0-10 Pain Score: 0-No pain  Therapy/Group: Individual Therapy  Valma Cava 07/09/2021, 12:59 PM

## 2021-07-10 DIAGNOSIS — Z7189 Other specified counseling: Secondary | ICD-10-CM

## 2021-07-10 DIAGNOSIS — Z515 Encounter for palliative care: Secondary | ICD-10-CM

## 2021-07-10 LAB — GLUCOSE, CAPILLARY: Glucose-Capillary: 92 mg/dL (ref 70–99)

## 2021-07-10 MED ORDER — ACETAMINOPHEN 325 MG PO TABS: 650.0000 mg | ORAL_TABLET | Freq: Three times a day (TID) | ORAL | Status: DC | PRN

## 2021-07-10 MED ORDER — LIDOCAINE 5 % EX PTCH
1.0000 | MEDICATED_PATCH | CUTANEOUS | Status: DC
  Filled 2021-07-10: qty 1

## 2021-07-10 NOTE — Progress Notes (Signed)
Ordered K-pad

## 2021-07-10 NOTE — Plan of Care (Signed)
  Problem: Consults Goal: RH STROKE PATIENT EDUCATION Description: See Patient Education module for education specifics  Outcome: Progressing Goal: Nutrition Consult-if indicated Outcome: Progressing   Problem: RH BOWEL ELIMINATION Goal: RH STG MANAGE BOWEL WITH ASSISTANCE Description: STG Manage Bowel with mod Assistance. Outcome: Progressing   Problem: RH BLADDER ELIMINATION Goal: RH STG MANAGE BLADDER WITH ASSISTANCE Description: STG Manage Bladder With max Assistance Outcome: Progressing   Problem: RH SKIN INTEGRITY Goal: RH STG MAINTAIN SKIN INTEGRITY WITH ASSISTANCE Description: STG Maintain Skin Integrity With min Assistance. Outcome: Progressing   Problem: RH SAFETY Goal: RH STG ADHERE TO SAFETY PRECAUTIONS W/ASSISTANCE/DEVICE Description: STG Adhere to Safety Precautions With cues and reminders Outcome: Progressing   Problem: RH KNOWLEDGE DEFICIT Goal: RH STG INCREASE KNOWLEDGE OF HYPERTENSION Description: Pt and family will be able to demonstrate understanding of medication management, dietary and lifestyle modifications to better control blood pressure and prevent stroke using handouts and booklets provided with mod I assist.  Outcome: Progressing Goal: RH STG INCREASE KNOWLEGDE OF HYPERLIPIDEMIA Description: Pt and family will be able to demonstrate understanding of medication management, dietary and lifestyle modifications to better control cholesterol levels and prevent stroke using handouts and booklets provided with mod I assist.  Outcome: Progressing Goal: RH STG INCREASE KNOWLEDGE OF STROKE PROPHYLAXIS Description: Pt and family will be able to demonstrate understanding of medication management, dietary and lifestyle modifications to better control blood pressure and prevent stroke using handouts and booklets provided with mod I assist.  Outcome: Progressing

## 2021-07-10 NOTE — Progress Notes (Signed)
Occupational Therapy Session Note  Patient Details  Name: Donald [name] MRN: [id number] Date of Birth: May 31, 1956  Today's Date: 07/10/2021 OT Individual Time: 1207-1300 OT Individual Time Calculation (min): 53 min    Short Term Goals: Week 4:  OT Short Term Goal 1 (Week 4): LTG=STG 2.2 ELOS  Skilled Therapeutic Interventions/Progress Updates:    Pt greeted semi-reclined in bed with spouse present. OT educated on use of friction reducing bag to don TED hose. Educated on rolling and hemiplegic positioning for comfort when rolling. Pt continues to have severe pain on L side with ANY movement at all. Discussed with pt and spouse that patient can do as much as he can to assist with rolling, but eventually we have to help as he is taking much more than reasonable amount of time to do any activity 2/2 fear of the pain. Pt transferred to sitting EOB with max A. Worked on sitting balance at EOB and educated spouse on using pillow on R side to cue him to lean to the R. Assisted with changing shirt at EOB with max A and pt reporting pain in L UE. Stand-pivot over to TIS wc with max A and use of grab belt. Discussed purchase of SAEBO with spouse and educated resting hand splint. OT provided handout regarding self-ROM techniques as well. Pt left seated in wc at end of session with spouse assisting with lunch. Chair alarm on and needs met.   Therapy Documentation Precautions:  Precautions Precautions: Fall Precaution Comments: L hemiparesis, L inattention, pusher to the L, cognitive deficits, cortrak Restrictions Weight Bearing Restrictions: No  Pain:  Pain when transferring in L UE and LLE, rest and repositioned for comfort.   Therapy/Group: Individual Therapy  Valma Cava 07/10/2021, 1:03 PM

## 2021-07-10 NOTE — Consult Note (Signed)
Palliative Medicine Inpatient Consult Note  Consulting Provider: Meredith Staggers, MD  Reason for consult:  "Pt with significant ICH with left hemiparesis/cognitive deficits. Wife bringing him home despite care needs. Please help wife family with goals of care and community resources."    Per intake H&P --> Donald [name] is a 65 year old right-handed male with history of OA and and progressive dementia work-up who was originally admitted on 05/11/2021 with acute left hemiparesis with sensory deficits, right gaze preference and left facial droop due to 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  He was started on Cleviprex for BP control and Dr. Erlinda Hong question CAA as cause of bleed.  He continued to be limited by left-sided weakness with right gaze preference affecting ADLs and mobility.  He was admitted to [facility] on 05/23/2021 for intensive inpatient rehab program.  He was found to have ileus but was asymptomatic and monitored.  Labs showed mild hyponatremia which was stable.  On a.m. on 06/12, he was noted to have decreasing level of consciousness with neurological changes and was transferred to acute for management.  CT of head done revealing interval progression of right frontal hematoma with worsening of mass-effect and midline shift measuring 13 mm.  He was started on hypertonic saline administered due to concerns of seizure activity.  CIR recommended due to functional decline. Shown has not been making tremendous progress unfortunately.   The Palliative care team was asked to get involved to help patients wife with goals of care conversations in the setting of notable functional and cognitive declines.   Clinical Assessment/Goals of Care:  *Please note that this is a verbal dictation therefore any spelling or grammatical errors are due to the "[employer]" system interpretation.  I have reviewed medical records including EPIC notes, labs and imaging, received report from  bedside RN, assessed the patient was lying in bed in no acute distress   I met with Donald [name] and his spouse, Donald [name] to further discuss diagnosis prognosis, GOC, EOL wishes, disposition and options.  A careful review of patient's past medical history was complete inclusive of arthritis, deep vein thrombosis, and intraparenchymal hematoma.  Reviewed patient's hospital course since 05/10/2021.  Discussed how long his hospitalization has been and his new baseline level of function physically and cognitively.   I introduced Palliative Medicine as specialized medical care for people living with serious illness. It focuses on providing relief from the symptoms and stress of a serious illness. The goal is to improve quality of life for both the patient and the family.  Donald [name] is from [place] originally.  He immigrated to the [place] in 1986.  He is married.  He has 4 children and 2 grandchildren.  He got a degree in accounting and also worked as a Theme park manager.  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  He used to very much enjoy traveling, reading, and going to the park.  He is a man of faith though is nondenominational.  Prior to hospitalization in May Emanuel was fully independent of all BADLs and IADLs.  Per his wife he was still driving regularly.  A detailed discussion was had today regarding advanced directives patient had never completed these though his wife is his primary Media planner.    Concepts specific to code status, artifical feeding and hydration, continued IV antibiotics and rehospitalization was had. Provided  "Hard Choices for Loving People" booklet and a MOST form for review and completion.  The difference between a aggressive medical intervention path  and a palliative comfort care path for this patient at this time was had.  At this time Donald [name] and his wife would like to pursue life-sustaining interventions to continue  living.  Donald [name]'s wife shares with me that she is also caring for her mother at this point in time as she has 24/7 healthcare needs.  We reviewed how difficult that must be for her as she is only 1 person and she is still actively working as a Pharmacist, hospital.  Discussed that Donald [name] will have quite a bit of need when he discharges to the home.  Reviewed information on the pace program and provided Donald [name] with printouts of all enrollment information as this is something that may be of value to manual moving forward.  Review of Donald [name] symptoms was completed most troubling is his constipation and his incremental plane with movement.  We discussed providing medications such as Tylenol prior to mobilizing and maintaining a regular bowel regiment.  A review of patient's medication record was completed.  Patient's wife is concerned that the gabapentin he is provided may be causing some issues with mentation and reluctance he in terms of him working with PT.  Overall goals are for improvement in physical function.   Discussed the importance of continued conversation with family and their  medical providers regarding overall plan of care and treatment options, ensuring decisions are within the context of the patients values and GOCs.  Decision Maker: Donald [name] (Spouse) [phone]  SUMMARY OF RECOMMENDATIONS   Full Code --> Provided  "Hard Choices for Loving People" booklet and a MOST   Provided information on the PACE program  Goals are for continued improvements  Ongoing incremental support  Code Status/Advance Care Planning: FULL CODE   Symptom Management:  Constipation:  - Miralax BID - Bisacodyl PRN Daily  Generalized Pain: - Premedicate with tylenol prior to mobility, had changed frequency to TID PRN - Agree with gabapentin, monitor cognition closely - Agree with lidocaine and muscle rub   Palliative Prophylaxis:  Oral Care, Mobility  Additional Recommendations (Limitations,  Scope, Preferences): Continue current scope of care   Psycho-social/Spiritual:  Desire for further Chaplaincy support: No Additional Recommendations: Education on debility in the setting of a stroke   Prognosis: Unclear, frailty score presently severely frail. Last albumin 2.9.  Discharge Planning: Discharge will be to home where patients wife will be the primary caregiver. Plan for PT/OT once home.   Vitals:   07/10/21 0003 07/10/21 0402  BP: 115/84 122/81  Pulse: 98 (!) 102  Resp: 18 18  Temp: 98 F (36.7 C) 98.3 F (36.8 C)  SpO2: 100% 100%    Intake/Output Summary (Last 24 hours) at 07/10/2021 0700 Last data filed at 07/09/2021 1700 Gross per 24 hour  Intake 345 ml  Output --  Net 345 ml   Last Weight  Most recent update: 07/10/2021  4:58 AM    Weight  61.8 kg (136 lb 3.9 oz)            Gen:  Older AA M in NAD HEENT: moist mucous membranes CV: Irregular rate and rhythm PULM: clear to auscultation bilaterally  ABD: soft/nontender  EXT: No edema Neuro: Alert and oriented x1  PPS: 30-40%   This conversation/these recommendations were discussed with patient primary care team, Dr. Naaman Plummer  Time In: 3220 Time Out: 1147 Total Time: 72 Greater than 50%  of this time was spent counseling and coordinating care related to  the above assessment and plan.  [facility] Team Team Cell Phone: [phone] Please utilize secure chat with additional questions, if there is no response within 30 minutes please call the above phone number  Palliative Medicine Team providers are available by phone from 7am to 7pm daily and can be reached through the team cell phone.  Should this patient require assistance outside of these hours, please call the patient's attending physician.

## 2021-07-10 NOTE — Progress Notes (Signed)
Hand splint placed on Lt hand. LUE stiff in wrist and elbow. Patient only able to tolerate small degree of extension. PRAFO placed on LLE.

## 2021-07-10 NOTE — Progress Notes (Signed)
PROGRESS NOTE   Subjective/Complaints: Verbal ability tremendously improved since when I last saw him! Discussed with patient and he is very pleased to hear this C/o right sided upper back pain  ROS: +right sided upper back pain   Objective:   No results found. Recent Labs    07/09/21 0459  WBC 8.8  HGB 11.4*  HCT 35.3*  PLT 289      Recent Labs    07/09/21 0459  NA 134*  K 4.3  CL 105  CO2 21*  GLUCOSE 91  BUN 11  CREATININE 0.68  CALCIUM 9.3        Intake/Output Summary (Last 24 hours) at 07/10/2021 1342 Last data filed at 07/10/2021 0843 Gross per 24 hour  Intake 160 ml  Output --  Net 160 ml         Physical Exam: Vital Signs Blood pressure 127/79, pulse (!) 101, temperature 98.2 F (36.8 C), temperature source Oral, resp. rate 17, height 5\' 5"  (1.651 m), weight 61.8 kg, SpO2 100 %.  Gen: no distress, normal appearing HEENT: oral mucosa pink and moist, NCAT Cardio: tachycardia Chest: normal effort, normal rate of breathing Abd: soft, non-distended Ext: no edema Psych: pleasant, normal affect  Skin: No evidence of breakdown, no evidence of rash Neurological:  alert, stm deficits. Speech/voice louder.     Comments: left C7. STM deficits. Left inattention present. Dense left hemiparesis without change. RUE and RLE 4/5. Decreased LT Left side  ongoing. DTR's 3+ on left side. Musc: pain at left shoulder, wrist and thigh with ROM. Left shoulder tender/tight with ROM Tone: MAS 2-3 in the left bicep and finger flexors.    Assessment/Plan: 1. Functional deficits which require 3+ hours per day of interdisciplinary therapy in a comprehensive inpatient rehab setting. Physiatrist is providing close team supervision and 24 hour management of active medical problems listed below. Physiatrist and rehab team continue to assess barriers to discharge/monitor patient progress toward functional and  medical goals  Care Tool:  Bathing    Body parts bathed by patient: Chest, Abdomen, Face, Right upper leg, Left upper leg, Left arm   Body parts bathed by helper: Right arm, Buttocks, Left lower leg, Right lower leg, Front perineal area     Bathing assist Assist Level: Maximal Assistance - Patient 24 - 49%     Upper Body Dressing/Undressing Upper body dressing   What is the patient wearing?: Pull over shirt    Upper body assist Assist Level: Moderate Assistance - Patient 50 - 74%    Lower Body Dressing/Undressing Lower body dressing      What is the patient wearing?: Pants, Incontinence brief     Lower body assist Assist for lower body dressing: Total Assistance - Patient < 25%     Toileting Toileting    Toileting assist Assist for toileting: Maximal Assistance - Patient 25 - 49%     Transfers Chair/bed transfer  Transfers assist     Chair/bed transfer assist level: Total Assistance - Patient < 25%     Locomotion Ambulation   Ambulation assist   Ambulation activity did not occur: Safety/medical concerns  Assist level: 2 helpers Assistive device: Other (  comment) (3 Musketeer) Max distance: 25ft   Walk 10 feet activity   Assist  Walk 10 feet activity did not occur: Safety/medical concerns  Assist level: 2 helpers Assistive device:  (3 musketeers)   Walk 50 feet activity   Assist Walk 50 feet with 2 turns activity did not occur: Safety/medical concerns         Walk 150 feet activity   Assist Walk 150 feet activity did not occur: Safety/medical concerns         Walk 10 feet on uneven surface  activity   Assist Walk 10 feet on uneven surfaces activity did not occur: Safety/medical concerns         Wheelchair     Assist Will patient use wheelchair at discharge?: Yes Type of Wheelchair: Manual    Wheelchair assist level: Dependent - Patient 0% Max wheelchair distance: 150    Wheelchair 50 feet with 2 turns  activity    Assist        Assist Level: Dependent - Patient 0%   Wheelchair 150 feet activity     Assist      Assist Level: Dependent - Patient 0%   Blood pressure 127/79, pulse (!) 101, temperature 98.2 F (36.8 C), temperature source Oral, resp. rate 17, height 5\' 5"  (1.651 m), weight 61.8 kg, SpO2 100 %.    Medical Problem List and Plan: 1.  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              -patient may shower             -ELOS/Goals: have extended stay to 7/30 given medical issues.     -have requested palliative care consult to help wife/family with community resources/care needs/medical decision making   Continue CIR 2.  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 Wife prefers no oral anticoagulation given hematoma.  -DVT/anticoagulation:  Has IVCF -Mechanical:  continue antiembolism stockings, knee (TED hose) Bilateral lower extremities -neurology feels risk of a/c outweighs any benefit             -antiplatelet therapy: N/A due to concerns of CAA 3. Low back pain, left hemiplegic shoulder, ?HO left hip, spasticity/neuropathic pain:   - continue lidocaine patch, added for right upper back myofacial pain as well.  -gabapentin changed to 400 mg nightly on 7/23 d/t day time fatigue.   -kpad for back, tylenol prn -off prednisone. Tizanidine stopped d/t sedation -ROM/splinting as possible 4. Arousal:  improved  - ritalin  10mg  bid for arousal/initiaton             -antipsychotic agents: N/A 5. Neuropsych: This patient is not fully capable of making decisions on his own behalf. 6. Skin/Wound Care: Routine pressure relief measures. 7. Fluids/Electrolytes/Nutrition: monitor fluid intake while on PO alone  7/29-    -K+ 4.2   -sodium up to 134   -BUN improved    -DC IVF   -push po   -recheck labs monday 8. Recurrent aspiration event:    -CXR recently stable, lungs clear 10. Blood  pressure:  Vitals:   07/10/21 0834 07/10/21 1200  BP: 111/73 127/79  Pulse: (!) 110 (!) 101  Resp: 17 17  Temp: (!) 97.5 F (36.4 C) 98.2 F (36.8 C)  SpO2: 100% 100%  Bp controlled. 11. Progressive cognitive decline: To reschedule dementia work up at [facility] after d/c. 12. Hyperglycemia: Hgb A1C- 5.8.             --  CBG (last 3)  Recent Labs    07/08/21 0555 07/09/21 0607 07/10/21 0607  GLUCAP 95 81 92   7/30 controlled  13. Dysphagia: . D2/thins diet --resume.              -continue megace    14. Tachycardia: will not treat unless symptomatic. Has improved with hydration - monitor HR TID 15. Elevated liver transaminases: Liver u/s with one small cystic structure.   -all LFT's nearly back wnl  -AP slightly elevated likely related to HO 16. Mild anemia-Ferritin elevated but Hgb, Fe++ and TIBC all low which point towards anemia of chronic disease.   17. Adynamic ileus  -7/29 pt demonstrating improvement. Has moved bowels  -change reglan to PO  - diet  advanced to D2  -support volume, electrolytes as above 18. Leukocytosis: likely due to thrombi, reactive  -down to 8 7/29  -ucx negative  -off ceftriaxone  -f/u monday        LOS: 36 days A FACE TO FACE EVALUATION WAS PERFORMED  Bryndon Cumbie P Hailley Byers 07/10/2021, 1:42 PM

## 2021-07-11 LAB — GLUCOSE, CAPILLARY: Glucose-Capillary: 91 mg/dL (ref 70–99)

## 2021-07-11 MED ORDER — MUSCLE RUB 10-15 % EX CREA
TOPICAL_CREAM | CUTANEOUS | Status: DC | PRN
  Filled 2021-07-11: qty 85

## 2021-07-11 NOTE — Progress Notes (Addendum)
Palliative Medicine Inpatient Follow Up Note  Consulting Provider: Meredith Staggers, MD   Reason for consult:  "Pt with significant ICH with left hemiparesis/cognitive deficits. Wife bringing him home despite care needs. Please help wife family with goals of care and community resources."    Per intake H&P --> Donald [name] is a 65 year old right-handed male with history of OA and and progressive dementia work-up who was originally admitted on 05/11/2021 with acute left hemiparesis with sensory deficits, right gaze preference and left facial droop due to 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  He was started on Cleviprex for BP control and Dr. Erlinda Hong question CAA as cause of bleed.  He continued to be limited by left-sided weakness with right gaze preference affecting ADLs and mobility.  He was admitted to CIR on 05/23/2021 for intensive inpatient rehab program.  He was found to have ileus but was asymptomatic and monitored.  Labs showed mild hyponatremia which was stable.  On a.m. on 06/12, he was noted to have decreasing level of consciousness with neurological changes and was transferred to acute for management.  CT of head done revealing interval progression of right frontal hematoma with worsening of mass-effect and midline shift measuring 13 mm.  He was started on hypertonic saline administered due to concerns of seizure activity.  CIR recommended due to functional decline. Camila has not been making tremendous progress unfortunately.   The Palliative care team was asked to get involved to help patients wife with goals of care conversations in the setting of notable functional and cognitive declines.   Today's Discussion (07/11/2021):  *Please note that this is a verbal dictation therefore any spelling or grammatical errors are due to the "[place] [employer]" system interpretation.  Chart reviewed.  I met with [name] at bedside this late morning. He appeared to be  comfortable and in no distress. His wife was not present at bedside. Denied additional questions for me at this time.   Objective Assessment: Vital Signs Vitals:   07/11/21 0758 07/11/21 1200  BP: (!) 134/107 103/72  Pulse: (!) 107 (!) 101  Resp: 19 18  Temp: 97.6 F (36.4 C) 98.6 F (37 C)  SpO2: 96% 100%    Intake/Output Summary (Last 24 hours) at 07/11/2021 1204 Last data filed at 07/11/2021 7741 Gross per 24 hour  Intake 240 ml  Output --  Net 240 ml   Last Weight  Most recent update: 07/11/2021  4:13 AM    Weight  62.7 kg (138 lb 3.7 oz)            Gen:  Older AA M in NAD HEENT: moist mucous membranes CV: Irregular rate and rhythm PULM: clear to auscultation bilaterally ABD: soft/nontender EXT: No edema Neuro: Alert and oriented x1  SUMMARY OF RECOMMENDATIONS   Full Code --> Provided  "Hard Choices for Loving People" booklet and a MOST   Provided information on the PACE program   Goals are for continued improvements  If family is amenable would be a good candidate for outpatient Palliative support   Code Status/Advance Care Planning: FULL CODE   Symptom Management:  Constipation: - Miralax BID - Bisacodyl PRN Daily   Generalized Pain: - Premedicate with tylenol prior to mobility, had changed frequency to TID PRN - Agree with gabapentin, monitor cognition closely - Agree with lidocaine and muscle rub  Time Spent: 15 Greater than 50% of the time was spent in counseling and coordination of care ______________________________________________________________________________________ Morrill  Palliative Medicine Team Team Cell Phone: [phone] Please utilize secure chat with additional questions, if there is no response within 30 minutes please call the above phone number  Palliative Medicine Team providers are available by phone from 7am to 7pm daily and can be reached through the team cell phone.  Should this patient require  assistance outside of these hours, please call the patient's attending physician.

## 2021-07-11 NOTE — Progress Notes (Signed)
PROGRESS NOTE   Subjective/Complaints: Wife requests Gabapentin be held tonight so he is less groggy tomorrow when he needs to go home. Discussed applying muscle rub for his myofascial pain instead.  Discussed his progress while here.   ROS: +right sided upper back pain, +left shoulder pain   Objective:   No results found. Recent Labs    07/09/21 0459  WBC 8.8  HGB 11.4*  HCT 35.3*  PLT 289      Recent Labs    07/09/21 0459  NA 134*  K 4.3  CL 105  CO2 21*  GLUCOSE 91  BUN 11  CREATININE 0.68  CALCIUM 9.3        Intake/Output Summary (Last 24 hours) at 07/11/2021 1652 Last data filed at 07/11/2021 1342 Gross per 24 hour  Intake 238 ml  Output --  Net 238 ml         Physical Exam: Vital Signs Blood pressure 110/80, pulse (!) 104, temperature 98.6 F (37 C), temperature source Oral, resp. rate 17, height 5\' 5"  (1.651 m), weight 62.7 kg, SpO2 100 %.  Gen: no distress, normal appearing HEENT: oral mucosa pink and moist, NCAT Cardio: tachycardia Chest: normal effort, normal rate of breathing Abd: soft, non-distended Ext: no edema Psych: pleasant, normal affect  Skin: No evidence of breakdown, no evidence of rash Neurological:  alert, stm deficits. Speech/voice louder.     Comments: left C7. STM deficits. Left inattention present. Dense left hemiparesis without change. RUE and RLE 4/5. Decreased LT Left side  ongoing. DTR's 3+ on left side. Musc: pain at left shoulder, wrist and thigh with ROM. Left shoulder tender/tight with ROM Tone: MAS 2-3 in the left bicep and finger flexors.    Assessment/Plan: 1. Functional deficits which require 3+ hours per day of interdisciplinary therapy in a comprehensive inpatient rehab setting. Physiatrist is providing close team supervision and 24 hour management of active medical problems listed below. Physiatrist and rehab team continue to assess barriers to  discharge/monitor patient progress toward functional and medical goals  Care Tool:  Bathing    Body parts bathed by patient: Chest, Abdomen, Face, Right upper leg, Left upper leg, Left arm   Body parts bathed by helper: Right arm, Buttocks, Left lower leg, Right lower leg, Front perineal area     Bathing assist Assist Level: Maximal Assistance - Patient 24 - 49%     Upper Body Dressing/Undressing Upper body dressing   What is the patient wearing?: Pull over shirt    Upper body assist Assist Level: Moderate Assistance - Patient 50 - 74%    Lower Body Dressing/Undressing Lower body dressing      What is the patient wearing?: Pants, Incontinence brief     Lower body assist Assist for lower body dressing: Total Assistance - Patient < 25%     Toileting Toileting    Toileting assist Assist for toileting: Maximal Assistance - Patient 25 - 49%     Transfers Chair/bed transfer  Transfers assist     Chair/bed transfer assist level: Total Assistance - Patient < 25%     Locomotion Ambulation   Ambulation assist   Ambulation activity did not occur:  Safety/medical concerns  Assist level: 2 helpers Assistive device: Other (comment) (3 Musketeer) Max distance: 48ft   Walk 10 feet activity   Assist  Walk 10 feet activity did not occur: Safety/medical concerns  Assist level: 2 helpers Assistive device:  (3 musketeers)   Walk 50 feet activity   Assist Walk 50 feet with 2 turns activity did not occur: Safety/medical concerns         Walk 150 feet activity   Assist Walk 150 feet activity did not occur: Safety/medical concerns         Walk 10 feet on uneven surface  activity   Assist Walk 10 feet on uneven surfaces activity did not occur: Safety/medical concerns         Wheelchair     Assist Will patient use wheelchair at discharge?: Yes Type of Wheelchair: Manual    Wheelchair assist level: Dependent - Patient 0% Max wheelchair  distance: 150    Wheelchair 50 feet with 2 turns activity    Assist        Assist Level: Dependent - Patient 0%   Wheelchair 150 feet activity     Assist      Assist Level: Dependent - Patient 0%   Blood pressure 110/80, pulse (!) 104, temperature 98.6 F (37 C), temperature source Oral, resp. rate 17, height 5\' 5"  (1.651 m), weight 62.7 kg, SpO2 100 %.    Medical Problem List and Plan: 1.  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              -patient may shower             -ELOS/Goals: have extended stay to 7/30 given medical issues.     -have requested palliative care consult to help wife/family with community resources/care needs/medical decision making   Continue CIR 2.  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 Wife prefers no oral anticoagulation given hematoma.  -DVT/anticoagulation:  Has IVCF -Mechanical:  continue antiembolism stockings, knee (TED hose) Bilateral lower extremities -neurology feels risk of a/c outweighs any benefit             -antiplatelet therapy: N/A due to concerns of CAA 3. Low back pain, left hemiplegic shoulder, ?HO left hip, spasticity/neuropathic pain:   - continue lidocaine patch, added for right upper back myofacial pain as well.  -gabapentin changed to 400 mg nightly on 7/23 d/t day time fatigue.   -kpad for back, tylenol prn -off prednisone. Tizanidine stopped d/t sedation -ROM/splinting as possible 4. Arousal:  improved  - ritalin  10mg  bid for arousal/initiaton             -antipsychotic agents: N/A 5. Neuropsych: This patient is not fully capable of making decisions on his own behalf. 6. Skin/Wound Care: Routine pressure relief measures. 7. Fluids/Electrolytes/Nutrition: monitor fluid intake while on PO alone  7/29-    -K+ 4.2   -sodium up to 134   -BUN improved    -DC IVF   -push po   -recheck labs monday 8. Recurrent aspiration event:     -CXR recently stable, lungs clear 10. Blood pressure:  Vitals:   07/11/21 1200 07/11/21 1617  BP: 103/72 110/80  Pulse: (!) 101 (!) 104  Resp: 18 17  Temp: 98.6 F (37 C) 98.6 F (37 C)  SpO2: 100% 100%  BP well controlled.  11. Progressive cognitive decline: To reschedule dementia work up at [facility] after d/c. 12.  Hyperglycemia: Hgb A1C- 5.8.             --    CBG (last 3)  Recent Labs    07/09/21 0607 07/10/21 0607 07/11/21 0559  GLUCAP 81 92 91   7/31 controlled  13. Dysphagia: . D2/thins diet --resume.              -continue megace    14. Tachycardia: will not treat unless symptomatic. Has improved with hydration - continue to monitor HR TID 15. Elevated liver transaminases: Liver u/s with one small cystic structure.   -all LFT's nearly back wnl  -AP slightly elevated likely related to HO  -repeat outpatient to monitor.  16. Mild anemia-Ferritin elevated but Hgb, Fe++ and TIBC all low which point towards anemia of chronic disease.  17. Adynamic ileus  -7/29 pt demonstrating improvement. Has moved bowels  -continue reglan PO  - diet  advanced to D2  -support volume, electrolytes as above 18. Leukocytosis: likely due to thrombi, reactive  -down to 8 7/29  -ucx negative  -off ceftriaxone  -f/u monday        LOS: 37 days A FACE TO FACE EVALUATION WAS PERFORMED  Drema Pry Belen Zwahlen 07/11/2021, 4:52 PM

## 2021-07-11 NOTE — Plan of Care (Signed)
  Problem: RH SKIN INTEGRITY Goal: RH STG MAINTAIN SKIN INTEGRITY WITH ASSISTANCE Description: STG Maintain Skin Integrity With min Assistance. 07/11/2021 2205 by Yolonda Kida, RN Outcome: Progressing 07/11/2021 2204 by Yolonda Kida, RN Outcome: Progressing

## 2021-07-12 DIAGNOSIS — Z9189 Other specified personal risk factors, not elsewhere classified: Secondary | ICD-10-CM

## 2021-07-12 DIAGNOSIS — E876 Hypokalemia: Secondary | ICD-10-CM

## 2021-07-12 LAB — BASIC METABOLIC PANEL
Anion gap: 10 (ref 5–15)
BUN: 12 mg/dL (ref 8–23)
CO2: 20 mmol/L — ABNORMAL LOW (ref 22–32)
Calcium: 9.1 mg/dL (ref 8.9–10.3)
Chloride: 104 mmol/L (ref 98–111)
Creatinine, Ser: 0.6 mg/dL — ABNORMAL LOW (ref 0.61–1.24)
GFR, Estimated: >60 mL/min (ref >60)
Glucose, Bld: 97 mg/dL (ref 70–99)
Potassium: 3.4 mmol/L — ABNORMAL LOW (ref 3.5–5.1)
Sodium: 134 mmol/L — ABNORMAL LOW (ref 135–145)

## 2021-07-12 LAB — CBC
HCT: 34.7 % — ABNORMAL LOW (ref 39.0–52.0)
Hemoglobin: 11.6 g/dL — ABNORMAL LOW (ref 13.0–17.0)
MCH: 30.6 pg (ref 26.0–34.0)
MCHC: 33.4 g/dL (ref 30.0–36.0)
MCV: 91.6 fL (ref 80.0–100.0)
Platelets: 332 10*3/uL (ref 150–400)
RBC: 3.79 MIL/uL — ABNORMAL LOW (ref 4.22–5.81)
RDW: 15.2 % (ref 11.5–15.5)
WBC: 7.6 10*3/uL (ref 4.0–10.5)
nRBC: 0 % (ref 0.0–0.2)

## 2021-07-12 LAB — GLUCOSE, CAPILLARY: Glucose-Capillary: 96 mg/dL (ref 70–99)

## 2021-07-12 MED ORDER — POLYETHYLENE GLYCOL 3350 17 G PO PACK: 17.0000 g | PACK | Freq: Two times a day (BID) | ORAL | 0 refills | Status: DC

## 2021-07-12 MED ORDER — METOCLOPRAMIDE HCL 10 MG PO TABS: 10.0000 mg | ORAL_TABLET | Freq: Three times a day (TID) | ORAL | 0 refills | Status: DC

## 2021-07-12 MED ORDER — METHYLPHENIDATE HCL 5 MG PO TABS
5.0000 mg | ORAL_TABLET | Freq: Two times a day (BID) | ORAL | 0 refills | Status: DC
Start: 2021-07-12 — End: 2021-09-08

## 2021-07-12 MED ORDER — MEGESTROL ACETATE 400 MG/10ML PO SUSP: 400.0000 mg | Freq: Two times a day (BID) | ORAL | 0 refills | Status: DC

## 2021-07-12 MED ORDER — ACETAMINOPHEN 325 MG PO TABS: 650.0000 mg | ORAL_TABLET | Freq: Three times a day (TID) | ORAL | Status: DC | PRN

## 2021-07-12 MED ORDER — LIDOCAINE 5 % EX PTCH: 2.0000 | MEDICATED_PATCH | CUTANEOUS | 0 refills | Status: DC

## 2021-07-12 MED ORDER — MUSCLE RUB 10-15 % EX CREA: 1.0000 "application " | TOPICAL_CREAM | Freq: Two times a day (BID) | CUTANEOUS | 0 refills | Status: DC

## 2021-07-12 MED ORDER — TRAZODONE HCL 50 MG PO TABS: 25.0000 mg | ORAL_TABLET | Freq: Every evening | ORAL | 0 refills | Status: DC | PRN

## 2021-07-12 MED ORDER — POTASSIUM CHLORIDE CRYS ER 10 MEQ PO TBCR: 10.0000 meq | EXTENDED_RELEASE_TABLET | Freq: Two times a day (BID) | ORAL | 0 refills | Status: DC

## 2021-07-12 NOTE — Progress Notes (Signed)
Occupational Therapy Discharge Summary  Patient Details  Name: Donald [name] MRN: [id number] Date of Birth: 09-05-1956   Patient has met 12 of 12 long term goals due to improved activity tolerance, improved balance, and improved awareness.  Patient to discharge at [facility]  Max Assist level.  Patient's care partner is independent to provide the necessary physical assistance at discharge.    Reasons goals not met: n/a   Recommendation:   Patient will benefit from ongoing skilled OT services in home health setting to continue to advance functional skills in the area of BADL and Reduce care partner burden.  Equipment: Tilt wheelchair , drop arm BSC, (therapy recommended hoywer lift but lift would not fit into home. Family opted to purchase a [employer]).  Reasons for discharge: treatment goals met and discharge from hospital  Patient/family agrees with progress made and goals achieved: Yes  OT Discharge Precautions/Restrictions  Precautions Precautions: Fall Precaution Comments: L hemiparesis, Restrictions Weight Bearing Restrictions: No Pain  Pain in L UE with transfers, repositioned and rest for comfort ADL ADL Eating: Set up Grooming: Minimal assistance Upper Body Bathing: Moderate assistance Where Assessed-Upper Body Bathing: Bed level Lower Body Bathing: Maximal assistance (total A) Where Assessed-Lower Body Bathing: Bed level Upper Body Dressing: Moderate assistance Where Assessed-Upper Body Dressing: Bed level Lower Body Dressing: Dependent (total A) Where Assessed-Lower Body Dressing: Bed level Toileting: Dependent (total A) Where Assessed-Toileting: Bed level Toilet Transfer: Maximal assistance Tub/Shower Transfer: Not assessed Social research officer, government: Not assessed Perception  Perception: Impaired (improved since eval) Praxis Praxis: Impaired Praxis Impairment Details: Initiation;Ideation;Motor planning Cognition Overall Cognitive Status:  Impaired/Different from baseline Arousal/Alertness: Awake/alert Orientation Level: Oriented to place;Oriented to situation Attention: Sustained Sustained Attention: Impaired Sustained Attention Impairment: Verbal basic;Functional basic Memory: Impaired Memory Impairment: Decreased recall of new information;Storage deficit;Retrieval deficit Awareness: Impaired Awareness Impairment: Intellectual impairment Problem Solving: Impaired Problem Solving Impairment: Functional basic Executive Function:  (all impaired due to lower level deficits) Safety/Judgment: Impaired Sensation Coordination Gross Motor Movements are Fluid and Coordinated: No Fine Motor Movements are Fluid and Coordinated: No Coordination and Movement Description: dense L sided Hemiplegia Motor  Motor Motor: Hemiplegia;Abnormal tone;Motor apraxia;Motor impersistence;Abnormal postural alignment and control Motor - Skilled Clinical Observations: Dense L hemiplegia Mobility  Bed Mobility Rolling Right: Moderate Assistance - Patient 50-74% Rolling Left: Total Assistance - Patient < 25% Supine to Sit: Total Assistance - Patient < 25% Sit to Supine: Total Assistance - Patient < 25% Transfers Sit to Stand: Maximal Assistance - Patient 25-49%  Balance Static Sitting Balance Static Sitting - Balance Support: Feet supported Static Sitting - Level of Assistance: 3: Mod assist;4: Min assist Dynamic Sitting Balance Dynamic Sitting - Balance Support: During functional activity Dynamic Sitting - Level of Assistance: 3: Mod assist;2: Max assist Static Standing Balance Static Standing - Level of Assistance: 2: Max assist Dynamic Standing Balance Dynamic Standing - Level of Assistance: 2: Max assist Extremity/Trunk Assessment RUE Assessment RUE Assessment: Within Functional Limits LUE Assessment LUE Assessment: Exceptions to [employer] Active Range of Motion (AROM) Comments: Brunnstrum level I in LUE, L hand General Strength  Comments: Strong flexor tone throughout UE LUE Body System: Neuro Brunstrum levels for arm and hand: Arm;Hand Brunstrum level for arm: Stage I Presynergy Brunstrum level for hand: Stage I Flaccidity   Daneen Schick Mehek Grega 07/12/2021, 4:02 PM

## 2021-07-12 NOTE — Progress Notes (Signed)
   Palliative Medicine Inpatient Follow Up Note  [place] is planned for discharge today. Have requested that the [facility] team arrange for outpatient Palliative services upon discharge. Patients spouse, Jayme Cloud will complete PACE paperwork to identify if [place] will be eligible for ongoing services.  Goals are for continued improvement.  No additional inpatient palliative needs to address.  No Charge ______________________________________________________________________________________ Lamarr Lulas [place] Palliative Medicine Team Team Cell Phone: [phone] Please utilize secure chat with additional questions, if there is no response within 30 minutes please call the above phone number  Palliative Medicine Team providers are available by phone from 7am to 7pm daily and can be reached through the team cell phone.  Should this patient require assistance outside of these hours, please call the patient's attending physician.

## 2021-07-12 NOTE — Plan of Care (Signed)
  Problem: RH Balance Goal: LTG: Patient will maintain dynamic sitting balance (OT) Description: LTG:  Patient will maintain dynamic sitting balance with assistance during activities of daily living (OT) Outcome: Completed/Met   Problem: Sit to Stand Goal: LTG:  Patient will perform sit to stand in prep for activites of daily living with assistance level (OT) Description: LTG:  Patient will perform sit to stand in prep for activites of daily living with assistance level (OT) Outcome: Completed/Met   Problem: RH Grooming Goal: LTG Patient will perform grooming w/assist,cues/equip (OT) Description: LTG: Patient will perform grooming with assist, with/without cues using equipment (OT) Outcome: Completed/Met   Problem: RH Bathing Goal: LTG Patient will bathe all body parts with assist levels (OT) Description: LTG: Patient will bathe all body parts with assist levels (OT) Outcome: Completed/Met   Problem: RH Dressing Goal: LTG Patient will perform upper body dressing (OT) Description: LTG Patient will perform upper body dressing with assist, with/without cues (OT). Outcome: Completed/Met   Problem: RH Functional Use of Upper Extremity Goal: LTG Patient will use RT/LT upper extremity as a (OT) Description: LTG: Patient will use right/left upper extremity as a stabilizer/gross assist/diminished/nondominant/dominant level with assist, with/without cues during functional activity (OT) Outcome: Completed/Met   Problem: RH Memory Goal: LTG Patient will demonstrate ability for day to day recall/carry over during activities of daily living with assistance level (OT) Description: LTG:  Patient will demonstrate ability for day to day recall/carry over during activities of daily living with assistance level (OT). Outcome: Completed/Met   Problem: RH Attention Goal: LTG Patient will demonstrate this level of attention during functional activites (OT) Description: LTG:  Patient will demonstrate  this level of attention during functional activites  (OT) Outcome: Completed/Met   Problem: RH Awareness Goal: LTG: Patient will demonstrate awareness during functional activites type of (OT) Description: LTG: Patient will demonstrate awareness during functional activites type of (OT) Outcome: Completed/Met   Problem: RH Toilet Transfers Goal: LTG Patient will perform toilet transfers w/assist (OT) Description: LTG: Patient will perform toilet transfers with assist, with/without cues using equipment (OT) Outcome: Completed/Met   Problem: RH Pre-functional/Other (Specify) Goal: RH LTG OT (Specify) 1 Description: RH LTG OT (Specify) 1 Outcome: Completed/Met   Problem: RH Pre-functional/Other (Specify) Goal: RH LTG OT (Specify) 2 Description: RH LTG OT (Specify) 2  Outcome: Completed/Met

## 2021-07-12 NOTE — Progress Notes (Signed)
INPATIENT REHABILITATION DISCHARGE NOTE   Discharge instructions by: Elita Quick, PA  Verbalized understanding: yes  Skin care/Wound care: none  Pain: none  IV's: removed  Tubes/Drains: none  Safety instructions: done  Patient belongings: sent with family  Discharged to: home  Discharged via: transportation  Notes: done

## 2021-07-12 NOTE — Progress Notes (Signed)
Speech Language Pathology Discharge Summary  Patient Details  Name: Donald [name] MRN: [id number] Date of Birth: 1956/03/22   Patient has met 6 of 6 long term goals.  Patient to discharge at overall Min;Mod level.   Reasons goals not met: N/A   Clinical Impression/Discharge Summary: Patient has made functional gains and has met 6 of 6 LTGs this admission. Currently, patient is consuming Dys. 2 textures with thin liquids with minimal overt s/s of aspiration and requires overall Min A verbal cues for use of swallowing compensatory strategies. Patient demonstrates improved speech intelligibility and is ~75-90% intelligible at the phrase level with Min verbal cues for use of an increased vocal intensity. Patient continues to demonstrate moderate-severe cognitive impairments but did demonstrate improvements in orientation with use of visual aids and sustained attention. Patient and family education is complete and patient will discharge home with 24 hour supervision from family. Patient would benefit from f/u SLP services to maximize his cognitive, swallowing and speech function in order to reduce caregiver burden.   Care Partner:  Caregiver Able to Provide Assistance: Yes  Type of Caregiver Assistance: Physical;Cognitive  Recommendation:  Home Health SLP;24 hour supervision/assistance  Rationale for SLP Follow Up: Reduce caregiver burden;Maximize functional communication;Maximize swallowing safety;Maximize cognitive function and independence   Equipment: EMST   Reasons for discharge: Discharged from hospital   Patient/Family Agrees with Progress Made and Goals Achieved: Yes    [place], [place] 07/12/2021, 1:49 PM

## 2021-07-12 NOTE — Progress Notes (Signed)
Inpatient Rehabilitation Care Coordinator Discharge Note  The overall goal for the admission was met for:   Discharge location: Yes. D/c to home with pt wife. Wife arranged w/c transportation home for pt.   Length of Stay: Yes. 37 days.  Discharge activity level: Yes. Max A.   Home/community participation: Yes. Limited.   Services provided included: MD, RD, PT, OT, SLP, RN, CM, TR, Pharmacy, Neuropsych, and SW  Financial Services: Private Insurance: [employer]  Choices offered to/list presented to:Yes  Follow-up services arranged: Home Health: [facility] for HHPT/OT/SN/SLP/Aide/SW, DME: [facility] for w/c and transfer board, and Patient/Family request agency HH: [facility] or [facility], DME: N/A  Comments (or additional information):  Patient/Family verbalized understanding of follow-up arrangements: Yes  Individual responsible for coordination of the follow-up plan: contact pt wife [name] [phone]  Confirmed correct DME delivered: Rana Snare 07/12/2021    Rana Snare

## 2021-07-12 NOTE — Discharge Summary (Signed)
Physician Discharge Summary  Patient ID: Donald [name] MRN: [id number] DOB/AGE: 04-06-1956 65 y.o.  Admit date: 06/04/2021 Discharge date: 07/12/2021  Discharge Diagnoses: Primary diagnosis:  Intraparenchymal hematoma of brain Secondary diagnosis:  Right femoral DVT Memory deficits-premorbid Malnutrition of moderate degree Transaminitis Acute blood loss anemia Hypokalemia Resolving ileus Moderate degree malnutrition. Prediabetes   Discharged Condition: fair  Significant Diagnostic Studies: DG Chest 2 View  Result Date: 07/06/2021 CLINICAL DATA:  Leukocytosis and abdominal pain. EXAM: CHEST - 2 VIEW COMPARISON:  06/05/2021. FINDINGS: Interval removal of feeding tube. Mediastinum and hilar structures normal. Lungs are clear. No pleural effusion or pneumothorax. Stable mild elevation left hemidiaphragm. Heart size normal. Degenerative change thoracic spine. IMPRESSION: Interval removal of feeding tube. Interim resolution of left base infiltrate. No acute cardiopulmonary disease. Electronically Signed   By: Maisie Fus  Register   On: 07/06/2021 05:17   DG Abd 1 View  Result Date: 07/07/2021 CLINICAL DATA:  Ileus EXAM: ABDOMEN - 1 VIEW COMPARISON:  Abdominal radiograph 07/05/2021 . FINDINGS: Gas is again noted throughout multiple loops of small bowel. No evidence of distension. Persistent and unchanged contrast material outlining a large stool ball in the rectum. The stool ball measures approximately 7.0 x 8.4 cm. [place] potentially retrievable IVC filter projects over the expected location. No acute osseous abnormality IMPRESSION: 1. Oral contrast material outlines a large 8.4 cm stool ball in the rectum. Findings are concerning for rectal fecal impaction. 2. Small gas noted throughout the nondilated small bowel consistent with ileus. Electronically Signed   By: Malachy Moan M.D.   On: 07/07/2021 09:18   DG Abd 1 View  Result Date: 07/06/2021 CLINICAL DATA:  Leukocytosis and  abdominal pain. EXAM: ABDOMEN - 1 VIEW COMPARISON:  Abdomen 06/04/2021. FINDINGS: Interval removal of feeding tube. IVC filter noted stable position. Air-filled loops of small and large bowel noted with minimal distention. Findings suggest adynamic ileus. Oral contrast noted in the rectum. Degenerative changes lumbar spine and both hips. Pelvic calcifications consistent phleboliths. IMPRESSION: Air-filled loops of small and large bowel noted with minimal distention. Findings suggest adynamic ileus. Electronically Signed   By: Maisie Fus  Register   On: 07/06/2021 05:19   CT HIP LEFT WO CONTRAST  Result Date: 06/23/2021 CLINICAL DATA:  Hip pain, stress fracture suspected. Patient denies recent injury or surgery. EXAM: CT OF THE LEFT HIP WITHOUT CONTRAST TECHNIQUE: Multidetector CT imaging of the left hip was performed according to the standard protocol. Multiplanar CT image reconstructions were also generated. COMPARISON:  Left hip radiographs 06/22/2021. One view abdomen 06/04/2021. FINDINGS: Bones/Joint/Cartilage No evidence of acute fracture, dislocation or femoral head avascular necrosis. There are mild underlying left hip degenerative changes. Multifocal heterotopic soft tissue calcification is seen surrounding the hip, further described below. There is a probable underlying left hip joint effusion versus synovitis. Ligaments Suboptimally assessed by CT. Muscles and Tendons There is heterotopic calcification within the muscles surrounding the left hip, including the left iliacus muscle. Some of this calcification is likely within the joint capsule. This calcification appears diffuse and is likely related to remote trauma and/or chronic inflammation. No apparent soft tissue mass. No significant focal muscular atrophy. Soft tissues Mild subcutaneous edema lateral to the left hip without focal fluid collection. Diffuse periarticular soft tissue calcifications as described above. IMPRESSION: 1. No evidence of acute  left hip fracture or dislocation. 2. Prominent left hip periarticular soft tissue calcifications, likely involving the muscles and joint capsule. This likely relates to remote injury and/or chronic inflammation. 3. Mild left  hip degenerative changes. Electronically Signed   By: Carey Bullocks M.D.   On: 06/23/2021 18:57   DG Shoulder Left  Result Date: 06/22/2021 CLINICAL DATA:  Pain EXAM: LEFT SHOULDER - 2+ VIEW COMPARISON:  None. FINDINGS: Alignment is anatomic. No acute fracture. Joint spaces are preserved. No intrinsic osseous lesion. IMPRESSION: Negative. Electronically Signed   By: Guadlupe Spanish M.D.   On: 06/22/2021 20:28   DG HIP UNILAT W OR W/O PELVIS 1V LEFT  Result Date: 06/22/2021 CLINICAL DATA:  Hip pain EXAM: DG HIP (WITH OR WITHOUT PELVIS) 3V COMPARISON:  None. FINDINGS: Pelvic ring is intact. Contrast material is noted within the rectum. Degenerative changes of the hip joints are noted. Some mild cortical irregularity is noted in the intratrochanteric region which is likely degenerative in nature although the possibility of an occult fracture could not be totally excluded. CT or MRI would be helpful as clinically indicated. IMPRESSION: Questionable findings in the proximal left femur. Cross-sectional imaging would be helpful for further evaluation. Electronically Signed   By: Alcide Clever M.D.   On: 06/22/2021 20:29    Labs:  Basic Metabolic Panel: Recent Labs  Lab 07/07/21 0507 07/09/21 0459 07/12/21 0450  NA 132* 134* 134*  K 4.2 4.3 3.4*  CL 102 105 104  CO2 19* 21* 20*  GLUCOSE 111* 91 97  BUN 18 11 12   CREATININE 0.76 0.68 0.60*  CALCIUM 9.5 9.3 9.1    CBC: Recent Labs  Lab 07/07/21 0507 07/09/21 0459 07/12/21 0450  WBC 13.2* 8.8 7.6  HGB 12.1* 11.4* 11.6*  HCT 36.9* 35.3* 34.7*  MCV 94.6 94.6 91.6  PLT 229 289 332    CBG: Recent Labs  Lab 07/08/21 0555 07/09/21 0607 07/10/21 0607 07/11/21 0559 07/12/21 0607  GLUCAP 95 81 92 91 96    Brief  HPI:   Donald [name] is a 65 y.o. OA, progressive cognitive issues with dementia work-up was admitted on 05/11/2021 with acute left hemiparesis with sensory deficits, right gaze preference and left facial droop due to 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  He was started on Cleviprex for BP control and Dr. 05/13/2021 question CAA as cause of bleed.  He was admitted to CIR on 05/20/2021 for intensive rehab program due to functional decline.  He was found to have asymptomatic ileus without GI symptoms.  On a.m. on 06/12, he was noted to have decreasing level of consciousness due to interval progression of right frontal hematoma with worsening of mass-effect and 30 mm midline shift.  He was transferred to acute hospital, started on hypertonic saline and Keppra administered due to concerns of seizure activity. Repeat CT of head showed evolving right superior frontal lobe hemorrhage with stable midline shift of 14 mm without clear transtentorial herniation.    CTA brain did not show a discrete vascular malformation or spot sign.  MRV was repeated and was negative for thrombosis.  BLE Doppler showed acute DVT in left common femoral vein and IVC filter was placed by radiology.  He was treated with 5-day course of Rocephin due to concerns of LL L PNA and amantadine was added to help with activation.  Tube feeds were started for nutritional support and mentation was improving however on 06/20 he had copious oral secretion with fevers and leukocytosis with concern for PNA.  He was started on Unasyn for treatment but had recurrent issues with lethargy therefore Keppra was discontinued due to concerns of side effects.  He continued to be limited  by left-sided weakness with right gaze preference and inability to cross eyes past midline, cognitive deficits with delay in processing, motor planning deficits, emerging tone as well as cognitive deficits.  CIR was recommended due to functional decline.    Hospital  Course: Donald [name] was admitted to rehab 06/04/2021 for inpatient therapies to consist of PT, ST and OT at least three hours five days a week. Past admission physiatrist, therapy team and rehab RN have worked together to provide customized collaborative inpatient rehab.  His blood pressures were monitored on TID basis and has been relatively stable.  He completed 10-day course of Unasyn on 06/30 for treatment of aspiration PNA.Marland Kitchen.  He has had issues with spastic left hemiparesis but was unable to tolerate tizanidine due to sedative side effects.  He has had limitation due to left hip pain question due to HO, spasticity and/or neuropathy. This has improved with addition of low-dose gabapentin as well as local measures including lidocaine patch and K pad.  Ritalin was added to help with activation and lethargy.  As mentation improved he was started on modified diet which is slowly been advanced to dysphagia 3.  Megace was added to help stimulate appetite and was briefly discontinued.  He did develop issues with abdominal pain with KUB on 07/25 revealing ileus.  He was started on Reglan and liquid diet as well as bowel program with resolution of constipation as well as abdominal symptoms. Diet was gradually advanced to dysphagia 3 and Megace was resumed with improvement in intake.  He has had issues with intermittent tachycardia and Ritalin was tapered to 5 mg twice daily.  He did have issues with recurrent leukocytosis and repeat urine culture was negative.  He was treated with ceftriaxone briefly.  BLE edema has been managed with use of teds.  Neurology was consulted about question of starting anticoagulation however patient had high risk for bleed and Dr. Roda ShuttersXu felt that anticoagulation not recommended unless there was a dire need.  Serial check of CBC shows mild anemia of chronic disease.  Serial check of electrolytes shows hyponatremia which is slowly resolving with with a course of IV fluids for hydration.   He was noted to have recurrent hyponatremia on 08/01 therefore started on K. Dur for supplement.    Recommend repeat BMET  in 5 to 7 days to monitor for stability.  Wife was also educated on increasing intake of potassium rich foods he has made slow steady gains during his stay.  Wife has been very supportive and has been here for support throughout his hospitalization.  He currently requires min assist at wheelchair level and will continue to receive follow-up home health PT, OT, ST, RN and aide via [facility] after discharge.   Rehab course: During patient's stay in rehab weekly team conferences were held to monitor patient's progress, set goals and discuss barriers to discharge. At admission, patient required total assist with basic ADL tasks and with mobility.  He exhibited severe multifactorial dysphagia with severe left neglect and moderate impairments in expressive and receptive language. He  has had improvement in activity tolerance, balance, postural control as well as ability to compensate for deficits.  He is able to complete ADL tasks with mod assist. He requires plus to assist with steady for transfers.  He is able to walk 50 feet with 2 helpers and 3 musketeer style. He is tolerating D2 diet, thin liquids with minimal S/S of aspiration and required min verbal cues for swallow strategies.  He has moderate to severe cognitive deficits but is showing improvement in use of visual aids for orientation as well as improvement in attention. Wife has been supportive and present for most therapy session. Family education was completed regarding all aspects of care and safety.      Discharge disposition: 01-Home or Self Care  Diet: Dysphagia 3, thin liquids with supervision.  Special Instructions: 1.  Routine pressure-relief measures.  Boost every 20 minutes when in chair. 2.  Offer small snacks on nutritional supplements between meals. 3.  Recommend repeat BMET in 5 to 7 days to monitor  potassium and sodium levels.    Discharge Instructions     Ambulatory referral to Neurology   Complete by: As directed    an appointment is requested in approximately: 2-3 weeks   Ambulatory referral to Physical Medicine Rehab   Complete by: As directed    3-4 week follow up appt      Allergies as of 07/12/2021       Reactions   Sulfa Antibiotics Other (See Comments)   Nausea and eye swelling        Medication List     STOP taking these medications    acetaminophen 160 MG/5ML solution Commonly known as: TYLENOL Replaced by: acetaminophen 325 MG tablet   amantadine 50 MG/5ML solution Commonly known as: SYMMETREL   amoxicillin-clavulanate 250-62.5 MG/5ML suspension Commonly known as: AUGMENTIN   feeding supplement (OSMOLITE 1.5 CAL) Liqd   feeding supplement (PROSource TF) liquid   free water Soln   insulin aspart 100 UNIT/ML injection Commonly known as: novoLOG   insulin glargine 100 UNIT/ML injection Commonly known as: LANTUS       TAKE these medications    acetaminophen 325 MG tablet Commonly known as: TYLENOL Take 2 tablets (650 mg total) by mouth 3 (three) times daily as needed for mild pain. Replaces: acetaminophen 160 MG/5ML solution   lidocaine 5 % Commonly known as: LIDODERM Place 2 patches onto the skin daily. Remove & Discard patch within 12 hours or as directed by MD   megestrol 400 MG/10ML suspension Commonly known as: MEGACE Take 10 mLs (400 mg total) by mouth 2 (two) times daily.   methylphenidate 5 MG tablet--Rx# 60 pills. Commonly known as: RITALIN Take 1 tablet (5 mg total) by mouth 2 (two) times daily with breakfast and lunch.   metoCLOPramide 10 MG tablet Commonly known as: REGLAN Take 1 tablet (10 mg total) by mouth 4 (four) times daily -  before meals and at bedtime. Notes to patient: If bowels are moving and abdominal symptoms are stable--Starting next Monday wean to three times a day before meals. After 7 days wean to  twice a week before meals then after 7 day once a day  In morning.   Muscle Rub 10-15 % Crea Apply 1 application topically 2 (two) times daily.   polyethylene glycol 17 g packet Commonly known as: MIRALAX / GLYCOLAX Take 17 g by mouth 2 (two) times daily.   potassium chloride 10 MEQ tablet Commonly known as: KLOR-CON Take 1 tablet (10 mEq total) by mouth 2 (two) times daily.   traZODone 50 MG tablet Commonly known as: DESYREL Take 0.5-1 tablets (25-50 mg total) by mouth at bedtime as needed for sleep.        Follow-up Information     Ranelle Oyster, MD Follow up.   Specialty: Physical Medicine and Rehabilitation Contact information: [address] [place] [address] [phone]  [facility]. Call.   Why: for appointment Contact information: [address]     [address] [place] [address] [phone]        [name], MD. Call.   Specialties: Family Medicine, Sports Medicine Why: for post hospital follow up Contact information: [address] [place] [address] [phone]                 Signed: Jacquelynn Cree 07/13/2021, 7:13 PM

## 2021-07-12 NOTE — Progress Notes (Signed)
PROGRESS NOTE   Subjective/Complaints: Pt up in bed eating breakfast. Wife with PA reviewing DC instructions. Patient going home today. Pt is in good spirits  ROS: Limited due to cognitive/behavioral    Objective:   No results found. Recent Labs    07/12/21 0450  WBC 7.6  HGB 11.6*  HCT 34.7*  PLT 332      Recent Labs    07/12/21 0450  NA 134*  K 3.4*  CL 104  CO2 20*  GLUCOSE 97  BUN 12  CREATININE 0.60*  CALCIUM 9.1        Intake/Output Summary (Last 24 hours) at 07/12/2021 0924 Last data filed at 07/11/2021 1742 Gross per 24 hour  Intake 236 ml  Output --  Net 236 ml         Physical Exam: Vital Signs Blood pressure 113/76, pulse (!) 111, temperature 97.9 F (36.6 C), resp. rate 16, height 5\' 5"  (1.651 m), weight 64.1 kg, SpO2 100 %.  Constitutional: No distress . Vital signs reviewed. HEENT: EOMI, oral membranes moist Neck: supple Cardiovascular: RRR without murmur. No JVD    Respiratory/Chest: CTA Bilaterally without wheezes or rales. Normal effort    GI/Abdomen: BS +, non-tender, non-distended Ext: no clubbing, cyanosis, or edema Psych: pleasant and cooperative  Skin: No evidence of breakdown, no evidence of rash Neurological:  alert, stm deficits. Speech/voice louder.     Comments: left C7. STM deficits. Left inattention present. Dense left hemiparesis without change. RUE and RLE 4/5. Decreased LT Left side  ongoing. DTR's 3+ on left side. Musc: pain at left shoulder, wrist and thigh with ROM. Left shoulder tender/tight with ROM Tone: MAS 2-3 in the left bicep and finger flexors is ongoing.   Assessment/Plan: 1. Functional deficits which require 3+ hours per day of interdisciplinary therapy in a comprehensive inpatient rehab setting. Physiatrist is providing close team supervision and 24 hour management of active medical problems listed below. Physiatrist and rehab team continue to  assess barriers to discharge/monitor patient progress toward functional and medical goals  Care Tool:  Bathing    Body parts bathed by patient: Chest, Abdomen, Face, Right upper leg, Left upper leg, Left arm   Body parts bathed by helper: Right arm, Buttocks, Left lower leg, Right lower leg, Front perineal area     Bathing assist Assist Level: Maximal Assistance - Patient 24 - 49%     Upper Body Dressing/Undressing Upper body dressing   What is the patient wearing?: Pull over shirt    Upper body assist Assist Level: Moderate Assistance - Patient 50 - 74%    Lower Body Dressing/Undressing Lower body dressing      What is the patient wearing?: Pants, Incontinence brief     Lower body assist Assist for lower body dressing: Total Assistance - Patient < 25%     Toileting Toileting    Toileting assist Assist for toileting: Maximal Assistance - Patient 25 - 49%     Transfers Chair/bed transfer  Transfers assist     Chair/bed transfer assist level: Total Assistance - Patient < 25%     Locomotion Ambulation   Ambulation assist   Ambulation activity did not  occur: Safety/medical concerns  Assist level: 2 helpers Assistive device: Other (comment) (3 Musketeer) Max distance: 3ft   Walk 10 feet activity   Assist  Walk 10 feet activity did not occur: Safety/medical concerns  Assist level: 2 helpers Assistive device:  (3 musketeers)   Walk 50 feet activity   Assist Walk 50 feet with 2 turns activity did not occur: Safety/medical concerns         Walk 150 feet activity   Assist Walk 150 feet activity did not occur: Safety/medical concerns         Walk 10 feet on uneven surface  activity   Assist Walk 10 feet on uneven surfaces activity did not occur: Safety/medical concerns         Wheelchair     Assist Will patient use wheelchair at discharge?: Yes Type of Wheelchair: Manual    Wheelchair assist level: Dependent - Patient  0% Max wheelchair distance: 150    Wheelchair 50 feet with 2 turns activity    Assist        Assist Level: Dependent - Patient 0%   Wheelchair 150 feet activity     Assist      Assist Level: Dependent - Patient 0%   Blood pressure 113/76, pulse (!) 111, temperature 97.9 F (36.6 C), resp. rate 16, height 5\' 5"  (1.651 m), weight 64.1 kg, SpO2 100 %.    Medical Problem List and Plan: 1.  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              dc home today with wife and family  -outpt f/u at [facility]  -neuro/palliative care 2.  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   -DVT/anticoagulation:  Has IVCF  -neurology feels risk of a/c outweighs any benefit              -antiplatelet therapy: N/A due to concerns of CAA 3. Low back pain, left hemiplegic shoulder, ?HO left hip, spasticity/neuropathic pain:   - continue lidocaine patch, added for right upper back myofacial pain as well.  -gabapentin changed to 400 mg nightly on 7/23 d/t day time fatigue.   -kpad for back, tylenol prn -off prednisone. Tizanidine stopped d/t sedation -ROM/splinting as possible 4. Arousal:  improved  - ritalin  10mg  bid for arousal/initiaton--continue             -  5. Neuropsych: This patient is not fully capable of making decisions on his own behalf. 6. Skin/Wound Care: Routine pressure relief measures. 7. Fluids/Electrolytes/Nutrition: monitor fluid intake while on PO alone  8/1    -K+ 3.4   -sodium up to 134   -BUN improved    -needs potassium supp 8. Recurrent aspiration event:    -CXR recently stable, lungs clear 10. Blood pressure:  8/23 Vitals:   07/12/21 0003 07/12/21 0828  BP: 114/82 113/76  Pulse: (!) 105 (!) 111  Resp: 18 16  Temp: 98 F (36.7 C) 97.9 F (36.6 C)  SpO2: 100% 100%  BP well controlled.  11. Progressive cognitive decline: To reschedule dementia work up at [facility] after d/c. 12.  Hyperglycemia: Hgb A1C- 5.8.             --    CBG (last 3)  Recent Labs    07/10/21 0607 07/11/21 0559 07/12/21 0607  GLUCAP 92 91 96   8/1 controlled  13. Dysphagia: . D2/thins diet -               -  continue megace    14. Tachycardia: will not treat unless symptomatic. Has improved with hydration - continue to monitor HR TID 15. Elevated liver transaminases: Liver u/s with one small cystic structure.   -all LFT's nearly back wnl  -AP slightly elevated likely related to HO  -repeat outpatient to monitor.  16. Mild anemia-Ferritin elevated but Hgb, Fe++ and TIBC all low which point towards anemia of chronic disease.  17. Adynamic ileus  -improved, reglan taper 18. Leukocytosis: likely due to thrombi, reactive  -resolved 7.6        LOS: 38 days A FACE TO FACE EVALUATION WAS PERFORMED  Ranelle Oyster 07/12/2021, 9:24 AM

## 2021-07-12 NOTE — Progress Notes (Signed)
Occupational Therapy Session Note  Patient Details  Name: Donald [name] MRN: [id number] Date of Birth: 1956-01-05  Today's Date: 07/12/2021 OT Individual Time: 9155-0271 OT Individual Time Calculation (min): 42 min    Short Term Goals: Week 4:  OT Short Term Goal 1 (Week 4): LTG=STG 2.2 ELOS  Skilled Therapeutic Interventions/Progress Updates:    Pt greeted semi-reclined in bed and agreeable to OT treatment session focused on self-care retraining. Pt noted to bed incontinent of bowel and bladder. Rolling with Mod A to the L and total A to the R for total A peri-care and brief change. Pt was able to assist with washing front peri-area with HOB raised. OT donned TED hose, socks, and pants, then used rolling as stated above to get pants over hips. Pt transitioned to sitting EOB with max A. Worked on sitting balance with max A progressing to min static sitting. UB bathing/dressing completed from EOB with mod/max A and mod A for dynamic sitting. Pt declined to get up to wc and was transitioned back to bed with total A. Pt left upright in bed with nurse tech informed of pt status as pt needs to eat breakfast. Bed alarm on, call bell in reach, and needs met.   Therapy Documentation Precautions:  Precautions Precautions: Fall Precaution Comments: L hemiparesis, L inattention, pusher to the L, cognitive deficits, cortrak Restrictions Weight Bearing Restrictions: No  Pain:  Pain in L UE, rest and repositioning for comfort:     Therapy/Group: Individual Therapy  Valma Cava 07/12/2021, 8:26 AM

## 2021-07-13 ENCOUNTER — Telehealth: Payer: Self-pay | Admitting: *Deleted

## 2021-07-13 NOTE — Telephone Encounter (Signed)
Prior auth for Lidocaine 5% patches submitted to [employer] via CoverMyMeds. Approval received 07/12/21-07/11/2024.

## 2021-07-14 NOTE — Progress Notes (Signed)
Physical Therapy Discharge Summary  Patient Details  Name: Donald [name] MRN: [id number] Date of Birth: 13-Aug-1956  Patient has met 4 of 7 long term goals due to improved activity tolerance, improved balance, improved postural control, increased strength, improved attention, and improved awareness.  Patient to discharge at a wheelchair level Max Assist.   Patient's care partner is independent to provide the necessary physical and cognitive assistance at discharge.  Reasons goals not met: Pt discharging home with wife providing maxA to [place]. Wife attended family ed multiple sessions and is independent with care.  Recommendation:  Patient will benefit from ongoing skilled PT services in home health setting to continue to advance safe functional mobility, address ongoing impairments in L sided strength, bed mobility, balance, transfers, and minimize fall risk.  Equipment: [place] Manual WC  Reasons for discharge: discharge from hospital  Patient/family agrees with progress made and goals achieved: Yes  PT Discharge Precautions/Restrictions Precautions Precautions: Fall Precaution Comments: L hemiparesis, Restrictions Weight Bearing Restrictions: No Vision/Perception  Perception Inattention/Neglect: Does not attend to left visual field;Does not attend to left side of body Praxis Praxis: Impaired Praxis Impairment Details: Initiation;Ideation;Motor planning  Cognition Overall Cognitive Status: Impaired/Different from baseline Arousal/Alertness: Awake/alert Orientation Level: Oriented to person;Oriented to place Safety/Judgment: Impaired Sensation Sensation Light Touch: Impaired Detail Light Touch Impaired Details: Impaired LUE;Impaired LLE Proprioception: Impaired by gross assessment Coordination Fine Motor Movements are Fluid and Coordinated: No Coordination and Movement Description: dense L sided Hemiplegia Motor  Motor Motor: Hemiplegia;Abnormal tone;Motor  apraxia;Motor impersistence;Abnormal postural alignment and control Motor - Skilled Clinical Observations: Dense L hemiplegia  Mobility Bed Mobility Bed Mobility: Supine to Sit;Sit to Supine Supine to Sit: Total Assistance - Patient < 25% Sit to Supine: Total Assistance - Patient < 25% Transfers Transfers: Sit to Stand;Squat Pivot Transfers;Stand Pivot Transfers Sit to Stand: Maximal Assistance - Patient 25-49% Stand Pivot Transfers: Maximal Assistance - Patient 25 - 49% Stand Pivot Transfer Details: Verbal cues for sequencing;Verbal cues for technique;Tactile cues for placement;Tactile cues for sequencing;Manual facilitation for weight shifting;Manual facilitation for weight bearing Squat Pivot Transfers: Maximal Assistance - Patient 25-49% Transfer (Assistive device): 1 person hand held assist Locomotion  Gait Ambulation: Yes Gait Assistance: 2 Helpers Gait Distance (Feet): 20 Feet Assistive device:  (3-musketeers) Gait Assistance Details: Manual facilitation for weight shifting;Tactile cues for weight shifting;Manual facilitation for weight bearing;Verbal cues for technique;Tactile cues for placement;Tactile cues for sequencing;Manual facilitation for placement;Verbal cues for sequencing;Verbal cues for gait pattern Gait Gait: Yes Gait Pattern: Impaired Gait Pattern: Lateral trunk lean to left;Left flexed knee in stance;Abducted- right Gait velocity: Decreased Stairs / Additional Locomotion Stairs: No Wheelchair Mobility Wheelchair Mobility: Yes Wheelchair Assistance: Dependent - Patient 0%  Trunk/Postural Assessment  Cervical Assessment Cervical Assessment:  (gaze R, forward head) Thoracic Assessment Thoracic Assessment:  (kyphotic) Lumbar Assessment Lumbar Assessment:  (posterior pelvic tilt) Postural Control Postural Control: Deficits on evaluation Head Control: L tilt Trunk Control: lateral bias to the l  Balance Balance Balance Assessed: Yes Static Sitting  Balance Static Sitting - Balance Support: Feet supported Static Sitting - Level of Assistance: 3: Mod assist;4: Min assist Dynamic Sitting Balance Dynamic Sitting - Balance Support: During functional activity Dynamic Sitting - Level of Assistance: 3: Mod assist;2: Max Insurance risk surveyor Standing - Balance Support: During functional activity Static Standing - Level of Assistance: 2: Max assist Dynamic Standing Balance Dynamic Standing - Balance Support: During functional activity Dynamic Standing - Level of Assistance: 2: Max assist Extremity Assessment  RLE Assessment  General Strength Comments: grossly 4+/5 functionally LLE Assessment General Strength Comments: No voluntary contaction noted.    [name], PT, DPT 07/14/2021, 10:04 AM

## 2021-08-02 ENCOUNTER — Telehealth: Payer: Self-pay

## 2021-08-02 NOTE — Telephone Encounter (Signed)
[facility] nurse with [facility] called:   Jethro Radke keeps fell out of his wheelchair on to the floor at home. The family had to call EMS or the Fire Dept to get him back up. Per Fredric Mare RN he had no injuries.  - Call back ph- [phone].

## 2021-08-03 NOTE — Telephone Encounter (Signed)
No action needed at this time. Nurse was just calling to report.

## 2021-08-10 NOTE — Telephone Encounter (Signed)
Baily  (nurse) is aware of Dr. Riley Kill reply. They are working on getting him a new high back wheelchair.

## 2021-08-11 NOTE — Progress Notes (Signed)
Subjective:  Patient ID: Donald [name], male    DOB: 01/24/1956  Age: 65 y.o. MRN: [id number]  CC:  Chief Complaint  Patient presents with   Hospitalization Follow-up    Pt has had multiple hospital visits and is here for follow up pt stroke and in need of lab work and other follow up     HPI Donald [name] presents for   Hospital follow-up: Intraparenchymal hematoma - brain.  Admitted in June 24 through August 1, inpatient rehab. Admitted June 12 through June 24 with worsening intracranial hemorrhage complicated with encephalopathy, aspiration pneumonia and sepsis Admitted June 9 through June 12 for inpatient rehab Admitted May 31 through June 9 for initial acute parenchymal hemorrhage in the posterior right frontal lobe, left hemiparesis  Initially admitted May 11, 2021 with acute left hemiparesis with sensory deficit, right gaze preference, left facial droop due to 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  Cleviprex for BP control.  Admitted to [facility] on 05/20/2021 for intensive rehab program due to functional decline.  Asymptomatic ileus without GI symptoms.  June 12 decreased level consciousness with interval progression of right frontal hematoma with worsening of mass-effect and midline shift.  Transferred to acute hospital, started on hypertonic saline, Keppra given due to concerns of seizure activity, repeat CT showed evolving right superior frontal lobe hemorrhage.  CTA brain did not show a discrete vascular malformation or spot sign.  MRV repeated and negative for thrombosis.  Noted to have acute DVT in left common femoral vein and IVC filter placed by radiology.  Treated with 5 days of Rocephin due to concerns of left lower lobe pneumonia, amantadine added to help with activation.  Tube feeds for nutritional support and mentation concern for pneumonia June 20.  Treated with Unasyn.  Keppra discontinued due to lethargy and concern for side effects.  Continued  limitation of left-sided weakness and right gaze preference, cognitive deficits with delay in processing and motor planning deficits emerging tone as well as cognitive deficits, CIR recommended due to functional decline.  Ultimately admitted in rehab June 24 for inpatient therapies of PT, ST, OT.  Blood pressure was monitored and were relatively stable.  Treated with 10 days Unasyn for aspiration pneumonia.  Unable to tolerate tizanidine due to sedative side effects for his spastic left hemiparesis.  Low-dose gabapentin as well as local measures including lidocaine patch and K pad for left hip pain due to spasticity and/or neuropathy.  Ritalin added to help with activation of lethargy.  Diet advanced to dysphagia 3.  Megace added for appetite then discontinued.  Ileus July 25.  Treated with Reglan and liquid diet with resolution of constipation and abdominal symptoms.  Intermittent issues with tachycardia, Reglan tapered to 5 mg twice daily.  Had issues with recurrent leukocytosis, repeat urine culture negative, brief treatment with ceftriaxone.  Bilateral lower extremity edema treated with teds, neuro consult about starting anticoagulation but with high risk for bleed not recommended unless there was a dire need.  CBC with anemia of chronic disease.  IV fluids given for hyponatremia.  K-Dur supplementation also given for hypokalemia.  Potassium rich foods recommended.  On discharge he required minimal assistance at wheelchair level and plan for continued home health PT, OT, ST, RN, and aide via [facility] home care.  Tolerating D2 diet, thin liquids with minimal signs or symptoms of aspiration, discharged on dysphagia 3 diet. Moderate to severe cognitive deficits.  Wife supportive and present for most of his therapy sessions.  Pressure  relief measures discussed when in chair, snacks or nutritional supplements between meals and plan for BMP in 5 to 7 days after hospital discharge. Appt planned 09/08/21 with Dr.  Hermelinda [facility], 09/14/21 with neurology - Dr. Pearlean Brownie.   Here with wife today. Some tremors at times  - did not tolerate mm relaxants.   Not taking megace - appetite doing well - regular diet at home and snacks between meals, fruit at times. Swallowing normally, no cough, no choking. Color of urine lighter  drinking more water. Normal bowel movements few times per day, no further constipation. wears incontinence pads. Some difficulty getting to bathroom in time. Uses condom cath throughout the day - will be working toward nighttime use, but working on regimen to being able to get to commode and upright position.  Has tilting chair on order - plan on updated assessment. Using WC or in bed.  Did not need speech therapy.  Still has OT and PT.  Discharged from skilled nursing last week. Doing ok. Vitals were ok off BP meds.   Able to tilt/shift frequently. Wife examining daily at toileting - no skin changes.  Denies acute needs with his care at home at this time.  Focus is doing well overall, memory difficult at times.   Hyponatremia, hypokalemia:  Lab Results  Component Value Date   NA 134 (L) 07/12/2021   K 3.4 (L) 07/12/2021   CL 104 07/12/2021   CO2 20 (L) 07/12/2021   Malnutrition: Plan for snacks or nutritional supplements between meals as above, last prealbumin normal at 22.1 on 07/05/21.   Anemia Lab Results  Component Value Date   WBC 7.6 07/12/2021   HGB 11.6 (L) 07/12/2021   HCT 34.7 (L) 07/12/2021   MCV 91.6 07/12/2021   PLT 332 07/12/2021     History Patient Active Problem List   Diagnosis Date Noted   At high risk for inadequate nutritional intake 07/12/2021   Transaminitis 06/06/2021   Intraparenchymal hematoma of brain (HCC) 06/04/2021   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HCC) 05/30/2021   Right lower lobe pneumonia 05/30/2021   Prediabetes 05/30/2021   Hyperglycemia 05/30/2021   Intracerebral hemorrhage 05/24/2021   Seizure (HCC) 05/23/2021    Malnutrition of moderate degree 05/22/2021   Memory deficits    OSA (obstructive sleep apnea)    Generalized OA    Hypokalemia    Leukocytosis    Annual physical exam 04/03/2017   Past Medical History:  Diagnosis Date   Arthritis    Past Surgical History:  Procedure Laterality Date   APPENDECTOMY     IR ANGIO INTRA EXTRACRAN SEL COM CAROTID INNOMINATE BILAT MOD SED  05/28/2021   IR ANGIO VERTEBRAL SEL VERTEBRAL BILAT MOD SED  05/28/2021   IR IVC FILTER PLMT / S&I /IMG GUID/MOD SED  05/27/2021   IR US GUIDE VASC ACCESS RIGHT  05/27/2021   Allergies  Allergen Reactions   Sulfa Antibiotics Other (See Comments)    Nausea and eye swelling    Prior to Admission medications   Medication Sig Start Date End Date Taking? Authorizing Provider  acetaminophen (TYLENOL) 325 MG tablet Take 2 tablets (650 mg total) by mouth 3 (three) times daily as needed for mild pain. 07/12/21   Love, Evlyn Kanner, PA-C  lidocaine (LIDODERM) 5 % Place 2 patches onto the skin daily. Remove & Discard patch within 12 hours or as directed by MD 07/12/21   Love, Evlyn Kanner, PA-C  megestrol (MEGACE) 400 MG/10ML suspension  Take 10 mLs (400 mg total) by mouth 2 (two) times daily. 07/12/21   Love, Evlyn Kanner, PA-C  Menthol-Methyl Salicylate (MUSCLE RUB) 10-15 % CREA Apply 1 application topically 2 (two) times daily. 07/12/21   Love, Evlyn Kanner, PA-C  methylphenidate (RITALIN) 5 MG tablet Take 1 tablet (5 mg total) by mouth 2 (two) times daily with breakfast and lunch. 07/12/21   Love, Evlyn Kanner, PA-C  metoCLOPramide (REGLAN) 10 MG tablet Take 1 tablet (10 mg total) by mouth 4 (four) times daily -  before meals and at bedtime. 07/12/21   Love, Evlyn Kanner, PA-C  polyethylene glycol (MIRALAX / GLYCOLAX) 17 g packet Take 17 g by mouth 2 (two) times daily. 07/12/21   Love, Evlyn Kanner, PA-C  potassium chloride (KLOR-CON) 10 MEQ tablet Take 1 tablet (10 mEq total) by mouth 2 (two) times daily. 07/12/21   Love, Evlyn Kanner, PA-C  traZODone (DESYREL) 50 MG tablet  Take 0.5-1 tablets (25-50 mg total) by mouth at bedtime as needed for sleep. 07/12/21   Love, Evlyn Kanner, PA-C   Social History   Socioeconomic History   Marital status: Married    Spouse name: Not on file   Number of children: Not on file   Years of education: Not on file   Highest education level: Not on file  Occupational History   Not on file  Tobacco Use   Smoking status: Never   Smokeless tobacco: Never  Substance and Sexual Activity   Alcohol use: No   Drug use: No   Sexual activity: Yes    Birth control/protection: None  Other Topics Concern   Not on file  Social History Narrative   Not on file   Social Determinants of Health   Financial Resource Strain: Not on file  Food Insecurity: Not on file  Transportation Needs: Not on file  Physical Activity: Not on file  Stress: Not on file  Social Connections: Not on file  Intimate Partner Violence: Not on file    Review of Systems Per HPI.   Objective:   Vitals:   08/12/21 1113  BP: 122/78  Pulse: (!) 108  Resp: 15  Temp: 98.3 F (36.8 C)  TempSrc: Temporal  SpO2: 96%  Height: 5\' 5"  (1.651 m)     Physical Exam Vitals reviewed.  Constitutional:      Appearance: He is well-developed.     Comments: Seated in wheelchair, some tremor in left leg intermittently.  No distress, able to vocalize concerns and responsive to questioning.    HENT:     Head: Normocephalic and atraumatic.  Neck:     Vascular: No carotid bruit or JVD.  Cardiovascular:     Rate and Rhythm: Normal rate and regular rhythm.     Heart sounds: Normal heart sounds. No murmur heard. Pulmonary:     Effort: Pulmonary effort is normal.     Breath sounds: Normal breath sounds. No rales.  Musculoskeletal:     Right lower leg: No edema.     Left lower leg: No edema.  Skin:    General: Skin is warm and dry.  Neurological:     Mental Status: He is alert.     Motor: Tremor present.  Psychiatric:        Mood and Affect: Mood normal.        Assessment & Plan:  Donald [name] is a 65 y.o. male . Intraparenchymal hematoma of brain, right, without loss of consciousness, sequela (HCC) - Plan: CBC, CANCELED:  CBC  -Stable, has home PT, OT in place.  Denies acute needs for equipment or resources at home at this time.  Planning on working on lifting chair.  Cautioned on signs and symptoms of pressure ulcers/pressure wounds.  RTC precautions.  Keep follow-up with physical medicine rehab and neurology.  Hyponatremia - Plan: Comprehensive metabolic panel, CANCELED: Comprehensive metabolic panel Hypokalemia - Plan: Comprehensive metabolic panel, CANCELED: Comprehensive metabolic panel  -Check updated labs  Malnutrition of moderate degree - Plan: Comprehensive metabolic panel, CANCELED: Comprehensive metabolic panel  -Check albumin on CMP, but has been eating well at home.  Denies symptoms of aspiration/choking.  No changes for now.  Anemia, unspecified type - Plan: CBC, CANCELED: CBC  -Thought to be anemia of chronic disease, check CBC for stability.  Follow-up in 2 months.  No orders of the defined types were placed in this encounter.  Patient Instructions  Labs checked today, no change in medications today.  Continue to watch for any signs of pressure wounds or ulcers.  Keep up with nutrition and diet.  Continue follow-up with your specialists as well as home PT, OT.  Follow-up with me in 2 months but if there are any concerns or questions prior to that time, I am happy to help.  Thanks for coming in today and take care!    Signed,   Meredith Staggers, MD [facility], [facility] 08/13/21 12:19 PM

## 2021-08-12 ENCOUNTER — Ambulatory Visit (INDEPENDENT_AMBULATORY_CARE_PROVIDER_SITE_OTHER): Payer: No Typology Code available for payment source | Admitting: Family Medicine

## 2021-08-12 ENCOUNTER — Other Ambulatory Visit: Payer: Self-pay

## 2021-08-12 VITALS — BP 122/78 | HR 108 | Temp 98.3°F | Resp 15 | Ht 65.0 in

## 2021-08-12 DIAGNOSIS — E440 Moderate protein-calorie malnutrition: Secondary | ICD-10-CM | POA: Diagnosis not present

## 2021-08-12 DIAGNOSIS — S06310S Contusion and laceration of right cerebrum without loss of consciousness, sequela: Secondary | ICD-10-CM

## 2021-08-12 DIAGNOSIS — E871 Hypo-osmolality and hyponatremia: Secondary | ICD-10-CM | POA: Diagnosis not present

## 2021-08-12 DIAGNOSIS — E876 Hypokalemia: Secondary | ICD-10-CM

## 2021-08-12 DIAGNOSIS — D649 Anemia, unspecified: Secondary | ICD-10-CM

## 2021-08-12 DIAGNOSIS — S06340S Traumatic hemorrhage of right cerebrum without loss of consciousness, sequela: Secondary | ICD-10-CM

## 2021-08-12 NOTE — Patient Instructions (Signed)
Labs checked today, no change in medications today.  Continue to watch for any signs of pressure wounds or ulcers.  Keep up with nutrition and diet.  Continue follow-up with your specialists as well as home PT, OT.  Follow-up with me in 2 months but if there are any concerns or questions prior to that time, I am happy to help.  Thanks for coming in today and take care!

## 2021-08-13 ENCOUNTER — Encounter: Payer: Self-pay | Admitting: Family Medicine

## 2021-08-18 ENCOUNTER — Other Ambulatory Visit: Payer: Self-pay

## 2021-08-18 ENCOUNTER — Telehealth: Payer: Self-pay

## 2021-08-18 ENCOUNTER — Other Ambulatory Visit (INDEPENDENT_AMBULATORY_CARE_PROVIDER_SITE_OTHER): Payer: No Typology Code available for payment source

## 2021-08-18 DIAGNOSIS — S06310S Contusion and laceration of right cerebrum without loss of consciousness, sequela: Secondary | ICD-10-CM

## 2021-08-18 DIAGNOSIS — E871 Hypo-osmolality and hyponatremia: Secondary | ICD-10-CM | POA: Diagnosis not present

## 2021-08-18 DIAGNOSIS — E876 Hypokalemia: Secondary | ICD-10-CM

## 2021-08-18 DIAGNOSIS — S06340S Traumatic hemorrhage of right cerebrum without loss of consciousness, sequela: Secondary | ICD-10-CM

## 2021-08-18 DIAGNOSIS — D649 Anemia, unspecified: Secondary | ICD-10-CM

## 2021-08-18 DIAGNOSIS — E440 Moderate protein-calorie malnutrition: Secondary | ICD-10-CM | POA: Diagnosis not present

## 2021-08-18 LAB — COMPREHENSIVE METABOLIC PANEL
ALT: 15 U/L (ref 0–53)
AST: 18 U/L (ref 0–37)
Albumin: 3.5 g/dL (ref 3.5–5.2)
Alkaline Phosphatase: 138 U/L — ABNORMAL HIGH (ref 39–117)
BUN: 5 mg/dL — ABNORMAL LOW (ref 6–23)
CO2: 30 mEq/L (ref 19–32)
Calcium: 10 mg/dL (ref 8.4–10.5)
Chloride: 103 mEq/L (ref 96–112)
Creatinine, Ser: 0.71 mg/dL (ref 0.40–1.50)
GFR: 96.85 mL/min (ref >60.00)
Glucose, Bld: 94 mg/dL (ref 70–99)
Potassium: 4.3 mEq/L (ref 3.5–5.1)
Sodium: 140 mEq/L (ref 135–145)
Total Bilirubin: 0.5 mg/dL (ref 0.2–1.2)
Total Protein: 6.6 g/dL (ref 6.0–8.3)

## 2021-08-18 LAB — CBC
HCT: 38.1 % — ABNORMAL LOW (ref 39.0–52.0)
Hemoglobin: 12.3 g/dL — ABNORMAL LOW (ref 13.0–17.0)
MCHC: 32.2 g/dL (ref 30.0–36.0)
MCV: 92.9 fl (ref 78.0–100.0)
Platelets: 311 10*3/uL (ref 150.0–400.0)
RBC: 4.1 Mil/uL — ABNORMAL LOW (ref 4.22–5.81)
RDW: 14.5 % (ref 11.5–15.5)
WBC: 4.1 10*3/uL (ref 4.0–10.5)

## 2021-08-18 NOTE — Telephone Encounter (Signed)
Disability Parking Placard placed in Dr Neva Seat bin for [employer]

## 2021-08-19 NOTE — Telephone Encounter (Signed)
Completed and placed in outgoing mail per the request of the patients wife.

## 2021-08-26 ENCOUNTER — Other Ambulatory Visit (HOSPITAL_COMMUNITY): Payer: Self-pay | Admitting: Interventional Radiology

## 2021-09-08 ENCOUNTER — Encounter: Payer: Self-pay | Admitting: Physical Medicine & Rehabilitation

## 2021-09-08 ENCOUNTER — Other Ambulatory Visit: Payer: Self-pay

## 2021-09-08 ENCOUNTER — Encounter
Payer: No Typology Code available for payment source | Attending: Physical Medicine & Rehabilitation | Admitting: Physical Medicine & Rehabilitation

## 2021-09-08 VITALS — BP 136/89 | HR 89 | Temp 97.9°F | Ht 65.0 in

## 2021-09-08 DIAGNOSIS — S06340S Traumatic hemorrhage of right cerebrum without loss of consciousness, sequela: Secondary | ICD-10-CM | POA: Insufficient documentation

## 2021-09-08 DIAGNOSIS — R7401 Elevation of levels of liver transaminase levels: Secondary | ICD-10-CM

## 2021-09-08 DIAGNOSIS — I611 Nontraumatic intracerebral hemorrhage in hemisphere, cortical: Secondary | ICD-10-CM | POA: Diagnosis not present

## 2021-09-08 DIAGNOSIS — S06310S Contusion and laceration of right cerebrum without loss of consciousness, sequela: Secondary | ICD-10-CM

## 2021-09-08 NOTE — Progress Notes (Signed)
Subjective:    Patient ID: Donald [name], male    DOB: 1955-12-30, 65 y.o.   MRN: [id number]  HPI  Mr. Brosh is here in follow-up of his right sided intraparenchymal hematoma with dense left hemiparesis and associated word finding deficits and dysphagia.  He has been home with his wife since the beginning of August.  He had some struggles that her own with care needs. Now they're getting into more of a routine. He's participating more in transfers using the stedy and makes it so that his wife can handle transfers. Family also is coming out to the house.   He had another w/c assessment performed. It was completed by outpatient therapies.  He has been struggling with his current wheelchair as it does not support his trunk or neck.  He developed increased tightness in his left neck as a result and tends to lean heavily to the left while in his chair despite being propped up.  Patient remains incontinent of bowel and bladder.  He remains total care for most of his needs.  He is eating well and his wife was able to stop his Megace.  She is also stopped all other medications over concerns that they were affecting his cognition.     Pain Inventory Average Pain 0 Pain Right Now 0 My pain is intermittent and aching  LOCATION OF PAIN  Left arm, Left leg  BOWEL Number of stools per week: 14 Oral laxative use No   BLADDER Foley  Mobility ability to climb steps?  no do you drive?  no use a wheelchair needs help with transfers Do you have any goals in this area?  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  yes  Neuro/Psych bladder control problems weakness numbness tremor trouble walking confusion  Prior Studies Any changes since last visit?  no  Physicians involved in your care Any changes since last visit?  no,  New Patient   Family History  Problem Relation Age of Onset   Hypertension Mother    Colon polyps Neg Hx    Colon cancer Neg Hx    Esophageal cancer Neg Hx    Rectal cancer Neg Hx    Stomach cancer Neg Hx    Social History   Socioeconomic History   Marital status: Married    Spouse name: Not on file   Number of children: Not on file   Years of education: Not on file   Highest education level: Not on file  Occupational History   Not on file  Tobacco Use   Smoking status: Never   Smokeless tobacco: Never  Vaping Use   Vaping Use: Never used  Substance and Sexual Activity   Alcohol use: No   Drug use: No   Sexual activity: Yes    Birth control/protection: None  Other Topics Concern   Not on file  Social History Narrative   Not on file   Social Determinants of Health   Financial Resource Strain: Not on file  Food Insecurity: Not on file  Transportation Needs: Not on file  Physical Activity: Not on file  Stress: Not on file  Social Connections: Not on file   Past Surgical History:  Procedure Laterality Date   APPENDECTOMY     IR ANGIO INTRA EXTRACRAN SEL COM CAROTID INNOMINATE BILAT MOD SED  05/28/2021   IR ANGIO VERTEBRAL SEL VERTEBRAL BILAT MOD SED  05/28/2021   IR IVC FILTER PLMT / S&I /IMG GUID/MOD SED  05/27/2021   IR US GUIDE VASC ACCESS RIGHT  05/27/2021   Past Medical History:  Diagnosis Date   Arthritis    BP 136/89   Pulse 89   Temp 97.9 F (36.6 C)   Ht 5\' 5"  (1.651 m)   SpO2 99%   BMI 23.52 kg/m   Opioid Risk Score:   Fall Risk Score:  `1  Depression screen PHQ 2/9  Depression screen [facility] 2/9 08/12/2021 11/09/2020 10/26/2020 09/09/2020 04/27/2020 12/16/2019 10/28/2019  Decreased Interest 1 0 0 0 0 0 0  Down, Depressed, Hopeless 1 0 0 0 0 0 0  PHQ - 2 Score 2 0 0 0 0 0 0  Altered sleeping 0 - - - - - -  Tired, decreased energy 1 - - - - - -  Change in appetite 0 - - - - - -  Feeling bad or failure about yourself  0 - - - - - -  Trouble  concentrating 1 - - - - - -  Moving slowly or fidgety/restless 0 - - - - - -  Suicidal thoughts 0 - - - - - -  PHQ-9 Score 4 - - - - - -    Review of Systems  Musculoskeletal:  Positive for gait problem.       Left arm, left leg weakness  Neurological:  Positive for tremors, weakness and numbness.  All other systems reviewed and are negative.     Objective:   Physical Exam Gen: no distress, normal appearing HEENT: oral mucosa pink and moist, NCAT Cardio: Reg rate Chest: normal effort, normal rate of breathing Abd: soft, non-distended Ext: no edema Psych: pleasant, normal affect Skin: intact Neuro: oriented to person, right gaze preference.  Left central 7.  Speech is dysarthric.  Patient able to follow simple one-step commands.  Leans to left in his wheelchair despite being propped up with pillows and neck rest.  Left sternocleidomastoid is very tight (3 out of 4) left biceps and brachioradialis is 2 out of 4.  He also has hamstring tightness.  Left upper extremity 0 out of 5 left lower extremity 0-5 in regard to motor function.  Patient remains sensitive to touch in the left upper extremity.  Did seem to tolerate left shoulder movement a bit better. Musculoskeletal:         Assessment & Plan:   1.  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              -continue with HH therapies  -We discussed today regarding his cognitive status and prognosis.  Given his premorbid dementia in the setting of the significant right sided intraparenchymal hematoma, the prognosis for substantial cognitive return is very guarded at this point.  I have not seen a lot of improvement since we last saw him in the hospital unfortunately.  Mobility Assessment  Mr. Autry Prust was seen today for the purpose of a mobility assessment for a customized wheelchair. I have reviewed and agree with the detailed PT evaluation from . He  suffers from  left hemiparesis related to a right-sided brain hemorrhage. Due to his left hemiparesis, he is unable to utilize a cane, walker, manual wheelchair, or scooter. The patient is appropriate for a customized power wheelchair. Specifically, the patient requires a wheelchair    .  The chair will also allow the patient to perform ADL's more easily.      2.  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 Low back pain, left hemiplegic shoulder, ?HO left hip,/neuropathic pain:    4.  Spastic left hemiparesis Needs appropriate chair and ongoing ROM, HEP, HH therapies -will set up for botox 300u left biceps, scm.  Will defer until later to address left hamstrings 5. Arousal:  improved             -will not resume ritalin   Fifteen minutes of face to face patient care time were spent during this visit. All questions were encouraged and answered.  Follow up with me in 4-6 weeks .

## 2021-09-08 NOTE — Patient Instructions (Signed)
calcium ?

## 2021-09-13 ENCOUNTER — Ambulatory Visit: Payer: No Typology Code available for payment source | Admitting: Family Medicine

## 2021-09-14 ENCOUNTER — Ambulatory Visit (INDEPENDENT_AMBULATORY_CARE_PROVIDER_SITE_OTHER): Payer: No Typology Code available for payment source | Admitting: Neurology

## 2021-09-14 ENCOUNTER — Telehealth: Payer: Self-pay | Admitting: *Deleted

## 2021-09-14 ENCOUNTER — Encounter: Payer: Self-pay | Admitting: Neurology

## 2021-09-14 VITALS — BP 133/95 | HR 94

## 2021-09-14 DIAGNOSIS — I611 Nontraumatic intracerebral hemorrhage in hemisphere, cortical: Secondary | ICD-10-CM

## 2021-09-14 DIAGNOSIS — I69154 Hemiplegia and hemiparesis following nontraumatic intracerebral hemorrhage affecting left non-dominant side: Secondary | ICD-10-CM

## 2021-09-14 DIAGNOSIS — S06310S Contusion and laceration of right cerebrum without loss of consciousness, sequela: Secondary | ICD-10-CM

## 2021-09-14 NOTE — Telephone Encounter (Signed)
Donald [name] PT with [facility] called and stated that Donald [name] was ready to transition to outpt therapy. I have placed referrals to outpt neuro rehab for all three disciplines.

## 2021-09-14 NOTE — Patient Instructions (Signed)
I had a long discussion with the patient and his wife regarding his intracerebral hemorrhage of indeterminate etiology and discussed imaging and hospital work-up results and answered questions.  I recommend continued strict control of hypertension with blood pressure goal below 140/90 and continue ongoing home physical and occupational therapy to be followed later by outpatient therapies.  Check MRI scan of the brain with and without contrast to look for any underlying small vascular structural lesion not seen on the initial scan.  He will return for follow-up in the future in 6 months or call earlier if necessary. Intracerebral Hemorrhage An intracerebral hemorrhage occurs when a blood vessel in the brain leaks or bursts (ruptures). As a result, that area of the brain becomes damaged. This is due to a lack of blood and oxygen, and from leaked blood that pools and presses on brain tissue. This is also called a bleeding (hemorrhagic) stroke. A stroke is a medical emergency. It must be treated right away. Early treatment will increase the chances of a better recovery. Delay may lead to permanent loss of brain function. What are the causes? This condition may be caused by: Head injury (trauma). A bulging of a weak section in a blood vessel (aneurysm). Thin and hardened blood vessels due to plaque buildup on the walls of an artery. Tangled blood vessels in the brain (arteriovenous malformation). A brain tumor. Protein buildup on the artery walls of the brain (amyloid angiopathy). Sometimes, the cause of this condition is not known. What increases the risk? You are more likely to develop this condition if you: Have high blood pressure (hypertension). Are an older adult. Abuse alcohol or drugs. Take blood-thinning medicines (anticoagulants). What are the signs or symptoms? Symptoms of this condition include: The sudden onset of: Weakness or numbness of the face, arm, or leg, especially on one side of  the body. Nausea and vomiting. Trouble speaking or understanding speech. Trouble seeing in one or both eyes. Difficulty walking or moving the arms or legs. Dizziness, or loss of balance or coordination. Confusion. Headache. Seizures. How is this diagnosed? This condition may be diagnosed based on: Your symptoms, medical history, and a physical exam. Tests, including: CT scan or MRI to view the brain. Computed tomography angiography (CTA) or a magnetic resonance angiogram (MRA) to view vessels in the brain. How is this treated? The goals of treatment are to stop the bleeding, control pressure in the brain, and relieve symptoms. Treatment may include: Medicines to treat symptoms, such as hypertension, pain, nausea, and vomiting. Other medicines, blood products, or vitamin K to control the bleeding. A tube (shunt) in the brain to relieve pressure. Assisted breathing (ventilation). Surgery to stop bleeding, remove a blood clot or tumor, and reduce pressure. Surgeries may include: Craniotomy. This temporarily removes part of the skull in order to reduce pressure in the brain. Stereotactic aspiration. This uses a needle or syringe to remove blood from the brain. Follow these instructions at home: Medicines Take over-the-counter and prescription medicines only as told by your health care provider. Do not take medicines, such as aspirin and ibuprofen, unless your health care provider tells you to take them. These medicines can thin your blood and increase the risk of bleeding. Eating and drinking Eat healthy foods as directed by your health care provider. You may be asked to: Eat a diet that is low in salt (sodium), saturated fat, trans fat, and cholesterol to manage your blood pressure. Seek the help of specialists if your ability to swallow  safely has been affected. A dietitian, speech-language specialists, and an occupational therapist can teach you how to get the nutrition you need. If  you drink alcohol: Limit how much you use to: 0-1 drink a day for women who are not pregnant. 0-2 drinks a day for men. Know how much alcohol is in your drink. In the [place] one drink equals one 12 oz bottle of beer (355 mL), one 5 oz glass of wine (148 mL), or one 1 oz glass of hard liquor (44 mL). Lifestyle Do not use any products that contain nicotine or tobacco. These include cigarettes, chewing tobacco, and vaping devices, such as e-cigarettes. If you need help quitting, ask your health care provider. Follow your health care provider's instructions about preventing falls. Your health care provider may: Arrange for specialists to evaluate your home. Recommend that you install grab bars in the bedroom and bathroom. Arrange for special equipment to be used at home, such as a raised toilet and a seat for the shower. Use a walker or a cane at all times if directed by your health care provider. Do physical activities as told by your health care provider. Ask your health care provider what activities are safe for you. General instructions Follow instructions to manage any other health conditions you may have, including high blood pressure, diabetes, and losing weight if needed. Keep all follow-up visits. This is important. Visits include referrals for physical and occupational therapy, rehabilitation, and lab tests. Get help right away if:  You have any symptoms of a stroke. "BE FAST" is an easy way to remember the main warning signs of a stroke: B - Balance. Signs are dizziness, sudden trouble walking, or loss of balance. E - Eyes. Signs are trouble seeing or a sudden change in vision. F - Face. Signs are sudden weakness or numbness of the face, or the face or eyelid drooping on one side. A - Arms. Signs are weakness or numbness in an arm. This happens suddenly and usually on one side of the body. S - Speech. Signs are sudden trouble speaking, slurred speech, or trouble understanding what  people say. T - Time. Time to call emergency services. Write down what time symptoms started. You have other signs of a stroke, such as: A sudden, severe headache with no known cause. Nausea or vomiting. Seizure. You have a partial or total loss of consciousness. Confusion. These symptoms may represent a serious problem that is an emergency. Do not wait to see if the symptoms will go away. Get medical help right away. Call your local emergency services (911 in the [place] Do not drive yourself to the hospital. Summary An intracerebral hemorrhage occurs when a blood vessel in the brain leaks or bursts. This is a medical emergency. Early treatment will increase the chances of a better recovery. Delay may lead to permanent damage and loss of brain function. The goals of treatment are to stop the bleeding, control pressure in the brain, and relieve symptoms. Keep all follow-up visits. This is important. Visits include referrals for physical and occupational therapy, rehabilitation, and lab tests. This information is not intended to replace advice given to you by your health care provider. Make sure you discuss any questions you have with your health care provider. Document Revised: 09/01/2020 Document Reviewed: 09/01/2020 Elsevier Patient Education  2022 [employer].

## 2021-09-14 NOTE — Progress Notes (Signed)
[facility] [address] [place]. [place] [address] [phone]       OFFICE CONSULT NOTE  [name] Date of Birth:  [birth year] Medical Record Number:  [id number]   Referring MD: Delle Reining, PA-C  Reason for Referral: Intracerebral hemorrhage  HPI: Donald [name] is a 65 year old African-American male seen today for office consultation visit following intracerebral hemorrhage.  He is accompanied by his wife.  History is obtained from them and review of electronic medical records and I personally reviewed pertinent available imaging films in PACS. Donald [name] is a 65 y.o. male with a medical history significant for dementia with behavioral disturbance and reported OSA not on CPAP per wife who presented to the ED via EMS for evaluation of acute onset of left hemiparesis, right gaze, and left mouth droop on 05/11/2021 Donald [name] had woken up in his normal state of health this morning. He and his wife had intercourse this morning and when she got up to get ready for the day, she noticed that Donald [name] was acting "off". She asked him to get out of bed and he stated that he was unable to move his left side and she noticed that he was looking towards the right so she activated EMS. His initial manual blood pressure at 08:15 on 05/11/2021 was 194/104 and on arrival to the hospital around 09:00 his blood pressure was 122/74. On arrival, Donald [name] was noted to have persistent left hemiparesis, right gaze, neck pain, left mouth droop, and decreased sensation on the left and was immediately taken to CT for stroke evaluation.  ICH score was 0.  NIH stroke scale on admission was 17.  CT head showed a large right frontal parenchymal hemorrhage with small adjacent subarachnoid hemorrhage and cytotoxic edema but no intraventricular extension hydrocephalus.  MRI scan of the brain was also obtained which showed parenchymal hematoma without underlying mass lesion.  CT angiogram of the  head and neck showed no large vessel stenosis, aneurysm or AV malformation.  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  2D echo showed ejection fraction of 65 to 70%.  LDL cholesterol is 140 mg percent.  Hemoglobin A1c of 5.8.  Urine drug screen was negative.  Patient was admitted in the intensive care unit.  Blood pressure slightly controlled.  Subsequently he was transferred out of the unit to inpatient rehab.  On 05/24/2021 he was found to be lethargic and drowsy and sleepy on the rehab floor and repeat CT scan of the head showed more prominent cerebral edema around his recent intracerebral hemorrhage with increased midline shift.  There was also concern for seizures so he was placed on long-term EEG monitoring and started on Keppra.  Several EEGs were obtained and long-term monitoring suggested right-sided cortical dysfunction due to the hemorrhage and severe diffuse encephalopathy but no definite seizures were noted.  He was started on hypertonic saline and was also found to be encephalopathy due to combination of cerebral edema, seizure fever and leukocytosis and mild renal insufficiency.  Condition gradually improved.  He subsequently underwent cerebral catheter angiogram on 05/28/2021 which showed again no evidence of aneurysm, AV malformation or vascular stenosis.  Right transverse and sigmoid sinuses had no flow likely due to occlusion.  His mental status showed gradual improvement and Keppra was discontinued and he was started on amantadine to promote wakefulness.  Patient went to inpatient rehab and was discharged home on August 1.  He is living at home with his wife being the sole caregiver.  They do have some help from family members.  Patient is currently getting home physical occupational therapy but is not able to walk.  He has trace movement in his left elbow extension and left feet.  He can help a little bit with  transfers but he can barely stand even with one-person assist from the therapist.  He has had cognitive impairment and memory problems and is slow in processing information though is able to speak clearly but speech is slow and hesitant and is not very talkative.  He has no prior history of strokes, TIAs, seizures or significant neurological problems.  He has no known history of hypertension.    ROS:   14 system review of systems is positive for weakness, difficulty walking, speech difficulty, memory loss, cognitive impairment, incontinence and all other systems negative  PMH:  Past Medical History:  Diagnosis Date   Arthritis     Social History:  Social History   Socioeconomic History   Marital status: Married    Spouse name: Not on file   Number of children: Not on file   Years of education: Not on file   Highest education level: Not on file  Occupational History   Not on file  Tobacco Use   Smoking status: Never   Smokeless tobacco: Never  Vaping Use   Vaping Use: Never used  Substance and Sexual Activity   Alcohol use: No   Drug use: No   Sexual activity: Yes    Birth control/protection: None  Other Topics Concern   Not on file  Social History Narrative   Not on file   Social Determinants of Health   Financial Resource Strain: Not on file  Food Insecurity: Not on file  Transportation Needs: Not on file  Physical Activity: Not on file  Stress: Not on file  Social Connections: Not on file  Intimate Partner Violence: Not on file    Medications:   Current Outpatient Medications on File Prior to Visit  Medication Sig Dispense Refill   Multiple Vitamin (MULTIVITAMIN) tablet Take 1 tablet by mouth daily.     No current facility-administered medications on file prior to visit.    Allergies:   Allergies  Allergen Reactions   Sulfa Antibiotics Other (See Comments)    Nausea and eye swelling     Physical Exam General: Frail malnourished looking middle-aged  African-American male sitting in a wheelchair., seated, in no evident distress Head: head normocephalic and atraumatic.   Neck: supple with no carotid or supraclavicular bruits Cardiovascular: regular rate and rhythm, no murmurs Musculoskeletal: no deformity Skin:  no rash/petichiae Vascular:  Normal pulses all extremities  Neurologic Exam Mental Status: Awake and fully alert.  Not very cooperative for detailed cognitive testing.  Follows only simple midline and one-step commands.'s speech is nonfluent and speaks only few words and short sentences but no dysarthria.   Cranial Nerves: Fundoscopic exam reveals sharp disc margins. Pupils equal, briskly reactive to light. Extraocular movements show right gaze preference but able to look to the left past midline but not all the way.  Does not blink to threat on the left but does so on the right.  Hearing intact. Facial sensation intact.  Left lower facial weakness., tongue, palate moves normally and symmetrically.  Motor: Spastic left hemiplegia with 1/5 strength in left upper extremity with mostly strength only in elbow extension and trace movement in the left lower extremities.  Tone is increased on the left.  Normal strength on the right. Sensory.: intact to touch , pinprick , position and vibratory sensation on the right and decreased on the left.  Coordination: Preserved on the right and not testable on the left.   \Gait and Station: Not tested as patient is unable to even stand. Reflexes: 1+ and asymmetric. Toes downgoing.   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  No history of hypertension or drug abuse and neurovascular imaging was unremarkable     PLAN:I had a long discussion with the patient and his wife regarding his intracerebral  hemorrhage of indeterminate etiology and discussed imaging and hospital work-up results and answered questions.  I recommend continued strict control of hypertension with blood pressure goal below 140/90 and continue ongoing home physical and occupational therapy to be followed later by outpatient therapies.  Check MRI scan of the brain with and without contrast to look for any underlying small vascular structural lesion not seen on the initial scan.  He will return for follow-up in the future in 6 months or call earlier if necessary.  Greater than 50% time during this 45-minute consultation visit was spent in counseling and coordination of care about his intracerebral hemorrhage and spastic Donald [name] and answering questions. Delia Heady, MD  Note: This document was prepared with digital dictation and possible smart phrase technology. Any transcriptional errors that result from this process are unintentional.

## 2021-09-16 ENCOUNTER — Encounter: Payer: Self-pay | Admitting: Physical Therapy

## 2021-09-16 ENCOUNTER — Ambulatory Visit: Payer: No Typology Code available for payment source | Attending: Physical Medicine & Rehabilitation

## 2021-09-16 ENCOUNTER — Ambulatory Visit: Payer: No Typology Code available for payment source | Admitting: Physical Therapy

## 2021-09-16 ENCOUNTER — Other Ambulatory Visit: Payer: Self-pay

## 2021-09-16 ENCOUNTER — Ambulatory Visit: Payer: No Typology Code available for payment source | Admitting: Occupational Therapy

## 2021-09-16 DIAGNOSIS — M25632 Stiffness of left wrist, not elsewhere classified: Secondary | ICD-10-CM | POA: Insufficient documentation

## 2021-09-16 DIAGNOSIS — M25622 Stiffness of left elbow, not elsewhere classified: Secondary | ICD-10-CM | POA: Insufficient documentation

## 2021-09-16 DIAGNOSIS — M25531 Pain in right wrist: Secondary | ICD-10-CM | POA: Diagnosis present

## 2021-09-16 DIAGNOSIS — I69154 Hemiplegia and hemiparesis following nontraumatic intracerebral hemorrhage affecting left non-dominant side: Secondary | ICD-10-CM | POA: Insufficient documentation

## 2021-09-16 DIAGNOSIS — R278 Other lack of coordination: Secondary | ICD-10-CM | POA: Diagnosis present

## 2021-09-16 DIAGNOSIS — R4701 Aphasia: Secondary | ICD-10-CM | POA: Diagnosis present

## 2021-09-16 DIAGNOSIS — M6281 Muscle weakness (generalized): Secondary | ICD-10-CM | POA: Diagnosis present

## 2021-09-16 DIAGNOSIS — R2681 Unsteadiness on feet: Secondary | ICD-10-CM

## 2021-09-16 DIAGNOSIS — R41842 Visuospatial deficit: Secondary | ICD-10-CM

## 2021-09-16 DIAGNOSIS — R2689 Other abnormalities of gait and mobility: Secondary | ICD-10-CM | POA: Diagnosis present

## 2021-09-16 DIAGNOSIS — R41840 Attention and concentration deficit: Secondary | ICD-10-CM

## 2021-09-16 DIAGNOSIS — R293 Abnormal posture: Secondary | ICD-10-CM | POA: Diagnosis present

## 2021-09-16 DIAGNOSIS — M25612 Stiffness of left shoulder, not elsewhere classified: Secondary | ICD-10-CM | POA: Diagnosis present

## 2021-09-16 DIAGNOSIS — R41841 Cognitive communication deficit: Secondary | ICD-10-CM

## 2021-09-16 NOTE — Therapy (Signed)
[facility] [address] [place], [place], [address] Phone: [phone]   Fax:  [phone]  Occupational Therapy Evaluation  Patient Details  Name: Donald [name] MRN: [id number] Date of Birth: 1956-05-05 Referring Provider (OT): Dr. Riley Kill   Encounter Date: 09/16/2021   OT End of Session - 09/16/21 1403     Visit Number 1    Number of Visits 25    Date for OT Re-Evaluation 03/10/22    Authorization Type UHC    Authorization Time Period goals written for 12 weeks, pt scheduled only for 6 week trial at this time    OT Start Time 1318    OT Stop Time 1355    OT Time Calculation (min) 37 min    Activity Tolerance Patient limited by fatigue;Patient limited by lethargy    Behavior During Therapy Flat affect             pain in the LUE for unable to rate, movement aggravates, rest alleviates  Pt goal is to be more Independent.   Past Medical History:  Diagnosis Date   Arthritis     Past Surgical History:  Procedure Laterality Date   APPENDECTOMY     IR ANGIO INTRA EXTRACRAN SEL COM CAROTID INNOMINATE BILAT MOD SED  05/28/2021   IR ANGIO VERTEBRAL SEL VERTEBRAL BILAT MOD SED  05/28/2021   IR IVC FILTER PLMT / S&I /IMG GUID/MOD SED  05/27/2021   IR US GUIDE VASC ACCESS RIGHT  05/27/2021    There were no vitals filed for this visit.         [facility] OT Assessment - 09/16/21 1326       Assessment   Medical Diagnosis ICH, SAH    Referring Provider (OT) Dr. 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, environmental; driving with [employer]    Leisure Likes driving, was a "people person"      ADL   Eating/Feeding Needs assist with cutting food   Pt is feeding himself following set up RUE   Grooming + 1 Total assistance   brushes teeth with setup   Upper Body Bathing + 1 Total asssestance    Lower Body Bathing + 1 Total assistance    Upper Body Dressing + 1 Total assistance    Lower Body Dressing +1 Total aassistance    Toilet Transfer + 1 Total assistance   condom cath, wears depends   Tub/Shower Transfer --   n/a     Mobility   Mobility Status Needs assist   per PT max A for transfer     Written Expression   Dominant Hand Right      Vision Assessment   Vision Assessment Vision impaired  _ to be further tested in functional context    Ocular Range of Motion Restricted on left   unable to track left   Comment  R gaze preference, pt will not turn to left despite verbal and tactile cues      Activity Tolerance   Activity Tolerance --   limited, pt was very fatigued following ST and PT     Cognition   Overall Cognitive Status Cognition to be further assessed in functional context PRN    Area of Impairment Orientation;Attention;Memory;Following commands;Safety/judgement;Awareness;Problem solving    General Comments not oriented    Memory Decreased short-term memory    Following Commands --   Does not follow 1 step consistently   Safety/Judgement Decreased awareness of deficits;Decreased awareness of safety    Awareness Intellectual    Awareness Comments impaired    Problem Solving Slow processing    Attention Sustained    Sustained Attention Impaired    Behaviors --   drowsy, slow processing     Observation/Other Assessments   Focus on Therapeutic Outcomes (FOTO)  Not set up at eval      Posture/Postural Control   Posture/Postural Control Postural limitations    Postural Limitations Rounded  Shoulders;Increased lumbar lordosis    Posture Comments Holds head in slight rotation toward right      Sensation   Light Touch Appears Intact   difficulty to assess due to cognition     Coordination   Gross Motor Movements are Fluid and Coordinated No    Fine Motor Movements are Fluid and Coordinated No    Other no active movment in LUE      Edema   Edema moderate in left hand and wrist, moderate spasticity LUE      Tone   Assessment Location Left Upper Extremity      ROM / Strength   AROM / PROM / Strength PROM;AROM      AROM   Overall AROM Comments GRossly WFLs RUE, difficult to assess due to cognitio, no active movement in LUE      PROM   Overall PROM  Deficits    Overall PROM Comments LUE shoulder grossly 40, elbow flexion/ extension 110/-60, supination to neutral, 90* pronation , fingers in fully extended position, unable to tolerate flexion due to pain      LUE Tone   LUE Tone Hypertonic;Moderate                                OT Short Term Goals - 09/16/21 1502       OT SHORT TERM GOAL #1   Title Pt's family with be I with inital HEP.    Time 6    Period Weeks    Status New    Target Date 10/28/21      OT SHORT TERM GOAL #2   Title Pt's family with be I with splint wear, care and precautions and LUE positioning to minimize pain and contracture.    Time 6    Period Weeks    Status New      OT SHORT TERM GOAL #3   Title Pt will consistently wash face and chest with no more than min A.    Time 6    Period Weeks    Status New      OT SHORT TERM GOAL #4   Title Pt will attend to left hemibody space at least 10% of the time with no more than mod v.c    Time 6    Period Weeks    Status New  OT SHORT TERM GOAL #5   Title Pt will assist with pulling down shirt in front at least 25% of them with min v.c    Time 6    Period Weeks    Status New      OT SHORT TERM GOAL #6   Title Pt will follow a simple  1 step command 75% of the  time during therapy.    Time 6    Period Weeks    Status New               OT Long Term Goals - 09/16/21 1513       OT LONG TERM GOAL #1   Title Pt will donn shirt with mod A, and mod v.c    Time 12    Period Weeks    Status New      OT LONG TERM GOAL #2   Title Pt will attend to L hemibody space during ADLs and functional activities at least 25% of the time with min v.c    Time 12    Period Weeks    Status New      OT LONG TERM GOAL #3   Title Pt will perfom UB bathing with min A    Time 12    Period Weeks    Status New      OT LONG TERM GOAL #4   Title Pt will attend to a simple ADL or functional task for at least 15 mins with no more than min v.c    Time 12    Period Weeks    Status New      OT LONG TERM GOAL #5   Title Pt will tolerate P/ROM shoulder flexion to 90* with pain no greater than 2/10 in prep for dressing.    Time 12    Period Weeks    Status New      Long Term Additional Goals   Additional Long Term Goals Yes      OT LONG TERM GOAL #6   Title Pt will perform toilet transfers with mod A.    Time 12    Period Weeks    Status New                   Plan - 09/16/21 1531     Clinical Impression Statement 65 y.o. male hospitlized originally 05/11/21 with  ICH R frontal lobe and small adjacent  SAH, pt transferred to rehab then he exeperienced worsening midline shift of R MCA, R ACA and DVT in RLE . Pt returned to CIR and he was d/c home 07/12/21. PMH: memory deficits, seizure, OSA, Pt has been recieving Home health therapies. Pt presents with the following deficits: cognitive deficits, right gaze preference/ L neglect, L hemiplegia, spasticity, pain, decreased ROM, decreased LUE functional use, cognitve deficits, decreased functional mobility which impedes performance of ADLS/ ADLs. Pt can benefit from skilled occupational therapy to address these deficits in order to maximize pt's safety and indpendence with daily activities.    OT  Occupational Profile and History Detailed Assessment- Review of Records and additional review of physical, cognitive, psychosocial history related to current functional performance    Occupational performance deficits (Please refer to evaluation for details): ADL's;IADL's;Rest and Sleep;Play;Leisure;Social Participation    Body Structure / Function / Physical Skills ADL;UE functional use;Flexibility;Pain;Vision;FMC;Proprioception;ROM;Gait;Coordination;GMC;Sensation;Decreased knowledge of precautions;Decreased knowledge of use of DME;Dexterity;Mobility;Tone;Strength;IADL    Cognitive Skills Attention;Energy/Drive;Memory;Orientation;Perception;Problem Solve;Safety Awareness;Sequencing;Thought;Understand    Rehab Potential Good  Clinical Decision Making Several treatment options, min-mod task modification necessary    Comorbidities Affecting Occupational Performance: May have comorbidities impacting occupational performance    Modification or Assistance to Complete Evaluation  Min-Moderate modification of tasks or assist with assess necessary to complete eval    OT Frequency 2x / week   plus eval   OT Duration 12 weeks    OT Treatment/Interventions Self-care/ADL training;Ultrasound;Visual/perceptual remediation/compensation;Patient/family education;Balance training;Passive range of motion;Gait Training;Paraffin;Cryotherapy;Fluidtherapy;Splinting;Building services engineer;Contrast Bath;Moist Heat;Therapeutic exercise;Manual Therapy;Therapeutic activities;Cognitive remediation/compensation;Neuromuscular education    Plan simple functional activities to increase L side awreness, education regarding positioning to minimize pain and contracture, custom resting hand splint for LUE    Consulted and Agree with Plan of Care Patient;Family member/caregiver             Patient will benefit from skilled therapeutic intervention in order to improve the following deficits and impairments:   Body Structure /  Function / Physical Skills: ADL, UE functional use, Flexibility, Pain, Vision, FMC, Proprioception, ROM, Gait, Coordination, GMC, Sensation, Decreased knowledge of precautions, Decreased knowledge of use of DME, Dexterity, Mobility, Tone, Strength, IADL Cognitive Skills: Attention, Energy/Drive, Memory, Orientation, Perception, Problem Solve, Safety Awareness, Sequencing, Thought, Understand     Visit Diagnosis: Other lack of coordination  Muscle weakness (generalized)  Pain in right wrist  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)  Other abnormalities of gait and mobility  Visuospatial deficit  Stiffness of left elbow, not elsewhere classified  Stiffness of left shoulder, not elsewhere classified  Stiffness of left wrist, not elsewhere classified  Attention and concentration deficit    Problem List Patient Active Problem List   Diagnosis Date Noted   At high risk for inadequate nutritional intake 07/12/2021   Transaminitis 06/06/2021   Intraparenchymal hematoma of brain 06/04/2021   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HCC) 05/30/2021   Right lower lobe pneumonia 05/30/2021   Prediabetes 05/30/2021   Hyperglycemia 05/30/2021   Intracerebral hemorrhage 05/24/2021   Seizure (HCC) 05/23/2021   Malnutrition of moderate degree 05/22/2021   Memory deficits    OSA (obstructive sleep apnea)    Generalized OA    Hypokalemia    Leukocytosis    Annual physical exam 04/03/2017    Travious Vanover, OT/L 09/16/2021, 4:41 PM  [facility] [address] [place], [place], [address] Phone: [phone]   Fax:  [phone]  Name: Donald [name] MRN: [id number] Date of Birth: 08-28-1956

## 2021-09-17 NOTE — Therapy (Signed)
[facility] [address] [place], [place], [address] Phone: [phone]   Fax:  [phone]  Physical Therapy Evaluation  Patient Details  Name: Donald [name] MRN: [id number] Date of Birth: 1956-01-02 Referring Provider (PT): Faith Rogue   Encounter Date: 09/16/2021   PT End of Session - 09/17/21 1315     Visit Number 1    Number of Visits 25    Date for PT Re-Evaluation 12/10/21    Authorization Type [employer]it [employer]-front office trying to contact insurance company for VL information    PT Start Time 1233    PT Stop Time 1317    PT Time Calculation (min) 44 min    Equipment Utilized During Treatment Gait belt    Activity Tolerance Patient tolerated treatment well    Behavior During Therapy Flat affect             Past Medical History:  Diagnosis Date   Arthritis     Past Surgical History:  Procedure Laterality Date   APPENDECTOMY     IR ANGIO INTRA EXTRACRAN SEL COM CAROTID INNOMINATE BILAT MOD SED  05/28/2021   IR ANGIO VERTEBRAL SEL VERTEBRAL BILAT MOD SED  05/28/2021   IR IVC FILTER PLMT / S&I /IMG GUID/MOD SED  05/27/2021   IR US GUIDE VASC ACCESS RIGHT  05/27/2021    There were no vitals filed for this visit.    Subjective Assessment - 09/16/21 1237     Subjective Pt's wife helps provides history.  L side is the weaker side.  Home health has just finished in the home.  With PT, they were working on L leg movmeent.  Have a  Stedy Lift at home that pt and wife use for transfers.  Pt is in a lonaer w/c, and they are waiting for his power wheelchair.    Patient is accompained by: Family member   wife   Pertinent History See problem list for PMH    Patient Stated Goals Wife's goal:  get back him to some form of mobility to walk with cane or walker.    Currently in Pain? No/denies   Wife reports sometimes pain with movement               [facility] PT Assessment - 09/16/21  1242       Assessment   Medical Diagnosis ICH, SAH    Referring Provider (PT) Faith Rogue    Onset Date/Surgical Date 05/11/21    Hand Dominance Right    Prior Therapy CIR, HHPT/OT      Precautions   Precautions Fall;Other (comment)    Precaution Comments Has condom catheter that wife manages; has IVC filter LLE      Balance Screen   Has the patient fallen in the past 6 months No    Has the patient had a decrease in activity level because of a fear of falling?  No    Is the patient reluctant to leave their home because of a fear of falling?  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, environmental; driving with [employer]    Leisure Likes driving, was a "people person"      Cognition   Overall Cognitive Status History of cognitive impairments - at baseline   per SLP eval note and chart notes     Observation/Other Assessments   Focus on Therapeutic Outcomes (FOTO)  Not set up at eval      Posture/Postural Control   Posture/Postural Control Postural limitations    Postural Limitations Rounded Shoulders;Increased lumbar lordosis;Forward head;Posterior pelvic tilt    Posture Comments Holds head in rotation toward right      Tone   Assessment Location Other (comment)      Tone Assessment - Other   Other Tone Location RLE increased extensor tone;  LLE increased flexor tone with attempts at Passive ROM      ROM / Strength   AROM / PROM / Strength PROM;Strength      PROM   Overall PROM  Deficits    Overall PROM  Comments L hamstrings -20 from full extension, L ankle dorsiflexion 10 degrees beyond neutral      Strength   Overall Strength Deficits    Overall Strength Comments Difficult to assess due to slowed processing/not able to follow directions for MMT      Transfers   Transfers Sit to Stand;Stand to Sit;Squat Pivot Transfers    Sit to Stand 2: Max assist;From chair/3-in-1;From bed   Pt places RUE on PT shoulder   Sit to Stand Details Manual facilitation for weight shifting;Manual facilitation for placement;Verbal cues for sequencing;Verbal cues for technique    Stand to Sit 2: Max assist;To bed;To chair/3-in-1    Stand to Sit Details (indicate cue type and reason) Verbal cues for sequencing;Verbal cues for technique;Manual facilitation for weight shifting;Manual facilitation for placement    Squat Pivot Transfers 2: Max assist    Squat Pivot Transfer Details (indicate cue type and reason) w/c to mat towards L side; PT provides max assist to scoot; mat>w/c to R side, PT provides max assist to scoot and set up.      Ambulation/Gait   Ambulation/Gait No      Balance   Balance Assessed Yes      Static Sitting Balance   Static Sitting - Balance Support Right upper extremity supported;Feet supported   Edge of mat   Static Sitting - Level of Assistance 5: Stand by assistance;4: Min assist    Static Sitting - Comment/# of Minutes Pt needs assist for forward lean, for scooting.  He is able to maintain static balance with UE support.                        Objective measurements completed on examination: See above findings.                PT Education - 09/17/21 1314     Education Details PT eval results, POC    Person(s) Educated Patient;Spouse    Methods Explanation    Comprehension Verbalized understanding              PT Short Term Goals - 09/17/21 1324       PT SHORT TERM GOAL #1   Title Pt will perform HEP with family supervision for improved  strength, balance, transfers, and posture for improved mobility.  TARGET 10/15/2021    Time 4  Period Weeks    Status New      PT SHORT TERM GOAL #2   Title Pt will perform squat/stand pivot transfer w/c<>mat with mod assist for decreased caregiver burden.    Baseline max assist    Time 4    Period Weeks    Status New      PT SHORT TERM GOAL #3   Title Pt will sit edge of mat, with RUE support and supervision at least 5 minutes, for improved participation with ADLs.    Time 4    Period Weeks    Status New      PT SHORT TERM GOAL #4   Title Pt will perform standing at sink/parallel bars, x 2 minutes, with mod assist, for improved participation in ADLs.    Time 4    Period Weeks    Status New               PT Long Term Goals - 09/17/21 1328       PT LONG TERM GOAL #1   Title Pt will perform progression of HEP with family supervision for improved strength, balance, transfers, and posture.  TARGET 12/10/2021    Time 12    Period Weeks    Status New      PT LONG TERM GOAL #2   Title Pt will perform stand pivot transfer w/c<>mat with min assist for improved mobility, decreased caregiver burden.    Time 12    Period Weeks    Status New      PT LONG TERM GOAL #3   Title Pt will perform at least 5 minutes of standing at sink, with min assist, for improved participation in ADLs.    Time 12    Period Weeks    Status New      PT LONG TERM GOAL #4   Title Further gait assessment as strength and standing tolerance progresses, with goal to be written as appropriate.                    Plan - 09/17/21 1317     Clinical Impression Statement Pt is a 65 year old male who presented to ED 05/11/2021 with CVA, went to CIR, then had to be readmitted.  He was hospitalized with  worsening midline shift of R MCA, R ACA and DVT in RLE and pt continues with right gaze and left hemiplegia. 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  He had Home health PT and  was referred to OPPT by Dr. Riley Kill.  He is currently max-total assist of family, who uses Stedy Lift and stand pivot transfers.  He is using a Development worker, community wheelchair, as he has been fitted for and gone through evaluation for personalized manual wheelchair; family awaiting this new w/c.  He presents with abnormal tone, abnormal posture, decreased strength, decreased flexibility, dependence in transfers, decreased standing tolerance.  Prior to CVA, pt was independent.  He would benefit from skilled PT to address the above stated deficits to help with improved functional mobility and independence, decreased caregiver burden.    Personal Factors and Comorbidities Comorbidity 2    Comorbidities Significant PMH: memory deficits, OSA.    Examination-Activity Limitations Locomotion Level;Transfers;Sit;Stand;Dressing;Toileting;Hygiene/Grooming;Bed Mobility;Bathing    Examination-Participation Restrictions Occupation;Community Activity;Church    Stability/Clinical Decision Making Evolving/Moderate complexity    Clinical Decision Making Moderate    Rehab Potential Fair    PT Frequency 2x / week    PT  Duration 12 weeks   plus eval   PT Treatment/Interventions ADLs/Self Care Home Management;Functional mobility training;Therapeutic activities;Therapeutic exercise;Balance training;Patient/family education;Orthotic Fit/Training;Wheelchair mobility training;Manual techniques;Passive range of motion;Neuromuscular re-education    PT Next Visit Plan Initiate HEP, utilize seated stepper for flexibility, strength; standing at standing frame or at sink/parallel bars (pt has Stedy at home).  Work on LLE neuro-reed, flexibility, weightshifting; posture and sitting balance    Consulted and Agree with Plan of Care Patient;Family member/caregiver             Patient will benefit from skilled therapeutic intervention in order to improve the following deficits and impairments:  Decreased range of motion, Impaired tone, Difficulty  walking, Decreased activity tolerance, Decreased balance, Impaired flexibility, Decreased mobility, Decreased strength, Postural dysfunction  Visit Diagnosis: Muscle weakness (generalized)  Abnormal posture  Unsteadiness on feet     Problem List Patient Active Problem List   Diagnosis Date Noted   At high risk for inadequate nutritional intake 07/12/2021   Transaminitis 06/06/2021   Intraparenchymal hematoma of brain 06/04/2021   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HCC) 05/30/2021   Right lower lobe pneumonia 05/30/2021   Prediabetes 05/30/2021   Hyperglycemia 05/30/2021   Intracerebral hemorrhage 05/24/2021   Seizure (HCC) 05/23/2021   Malnutrition of moderate degree 05/22/2021   Memory deficits    OSA (obstructive sleep apnea)    Generalized OA    Hypokalemia    Leukocytosis    Annual physical exam 04/03/2017    Lonia Blood W., PT 09/17/2021, 1:30 PM  [facility] [address] [place], [place], [address] Phone: [phone]   Fax:  [phone]  Name: Donald [name] MRN: [id number] Date of Birth: August 29, 1956

## 2021-09-17 NOTE — Therapy (Signed)
[facility] [address] [place], [place], [address] Phone: [phone]   Fax:  [phone]  Speech Language Pathology Evaluation  Patient Details  Name: Donald [name] MRN: [id number] Date of Birth: [birth year] Referring Provider (SLP): Ranelle Oyster, MD   Encounter Date: 09/16/2021   End of Session - 09/16/21 1438     Visit Number 1    Number of Visits 17    Date for SLP Re-Evaluation 11/12/21    Authorization Type U[employer]    SLP Start Time 1146    SLP Stop Time  1230    SLP Time Calculation (min) 44 min    Activity Tolerance Patient limited by lethargy             Past Medical History:  Diagnosis Date   Arthritis     Past Surgical History:  Procedure Laterality Date   APPENDECTOMY     IR ANGIO INTRA EXTRACRAN SEL COM CAROTID INNOMINATE BILAT MOD SED  05/28/2021   IR ANGIO VERTEBRAL SEL VERTEBRAL BILAT MOD SED  05/28/2021   IR IVC FILTER PLMT / S&I /IMG GUID/MOD SED  05/27/2021   IR US GUIDE VASC ACCESS RIGHT  05/27/2021    There were no vitals filed for this visit.       SLP Evaluation [facility] - 09/16/21 1145       SLP Visit Information   SLP Received On 09/14/21    Referring Provider (SLP) Ranelle Oyster, MD    Onset Date 05/11/2021    Medical Diagnosis Intraparenchymal hematoma of brain      Subjective   Patient/Family Stated Goal "focusing and thought processing"      Pain Assessment   Currently in Pain? No/denies      Balance Screen   Has the patient fallen in the past 6 months No      Prior Functional Status   Cognitive/Linguistic Baseline Baseline deficits    Baseline deficit details memory deficits    Type of Home House     Lives With Spouse    Available Support Family      Cognition   Overall Cognitive Status Impaired/Different from baseline    Area of Impairment Memory;Following commands;Awareness;Orientation    Orientation Level Place;Time;Situation;Disoriented to     Memory Decreased short-term memory    Memory Comments usual confabulation exhibited    Following Commands Follows one step commands inconsistently   slow processing, requires repetition   Safety/Judgement Decreased awareness of deficits      Auditory Comprehension   Overall Auditory Comprehension Impaired    Yes/No Questions Impaired    Basic Biographical Questions 26-50% accurate    Basic Immediate Environment Questions 25-49% accurate    Complex Questions 0-24% accurate    Commands Impaired    One Step Basic Commands 25-49% accurate    Conversation Simple    Interfering Components Processing speed;Working Designer, multimedia;Extra processing time      Reading Comprehension   Reading Status Impaired    Word level 26-50% accurate    Sentence Level 0-25% accurate    Interfering Components Working memory;Attention;Left neglect/inattention;Processing time      Expression   Primary Mode of Expression Verbal      Verbal Expression   Overall Verbal Expression Impaired    Initiation Impaired    Naming Impairment    Responsive 26-50% accurate    Confrontation 0-24% accurate    Verbal Errors Confabulation;Perseveration;Jargon    Pragmatics Impairment  Impairments Eye contact;Topic maintenance    Interfering Components Attention      Written Expression   Dominant Hand Right      Oral Motor/Sensory Function   Overall Oral Motor/Sensory Function Other (comment)   oral phase deficits previously reported in hospital; unable to further assess today due to reduced attention, command following, lethargy     Motor Speech   Overall Motor Speech Impaired    Phonation Low vocal intensity    Intelligibility Intelligibility reduced                             SLP Education - 09/16/21 1438     Education Details eval results, possible goals, functional tasks    Person(s) Educated Patient;Spouse    Methods Explanation;Demonstration     Comprehension Verbalized understanding;Returned demonstration;Need further instruction              SLP Short Term Goals - 09/17/21 1134       SLP SHORT TERM GOAL #1   Title Pt will establish and maintain eye contact during auditory instructions given occasional mod A to aid focused attention over 3 sessions    Time 4    Period Weeks    Status New    Target Date 10/15/21      SLP SHORT TERM GOAL #2   Title Pt will comprehend and demonstrate 1-step directions with 80% accuracy given no more than 3 repetitions over 3 sessions    Time 4    Period Weeks    Status New    Target Date 10/15/21      SLP SHORT TERM GOAL #3   Title Pt will maintain topic to related question for 30+ seconds given occasional mod A over 3 sessions    Time 4    Period Weeks    Status New    Target Date 10/15/21      SLP SHORT TERM GOAL #4   Title Pt will use multimodal communication to aid pt's comprehension and attention for 2 functional scenarios    Time 4    Period Weeks    Status New    Target Date 10/15/21      SLP SHORT TERM GOAL #5   Title Pt will comprehend simple yes/no questions with 90% accuracy with 2 or less repetitions over 2 sessions    Time 4    Period Weeks    Status New    Target Date 10/15/21              SLP Long Term Goals - 09/17/21 1140       SLP LONG TERM GOAL #1   Title Pt will comprehend and demonstrate functional 2-step directions with 80% accuracy given no more than 3 repetitions over 3 sessions    Time 8    Period Weeks    Status New    Target Date 11/12/21      SLP LONG TERM GOAL #2   Title Pt will participate in 1 minute conversation of personally relevant topics with no overt shifts in topic over 3 sessions    Time 8    Period Weeks    Status New    Target Date 11/12/21      SLP LONG TERM GOAL #3   Title Pt will comprehend functional mod complex yes/no questions with 80% accuracy with 2 or less repetitions over 2 sessions    Time 8    Period  Weeks    Status New    Target Date 11/12/21      SLP LONG TERM GOAL #4   Title Pt's wife will report increased comprehension and participation in daily activities with use of learned compensations by last ST session    Time 8    Period Weeks    Status New    Target Date 11/12/21              Plan - 09/16/21 1439     Clinical Impression Statement "Donald [name]" was referred for OPST secondary to intraparenchymal hematoma. Pt was hospitalized from 05/11/21 to 07/12/21. Today pt was accompanied by his wife, Jayme Cloud, who provided entirety of history. Pt had hx of cognitive decline and memory deficits prior to hospitalization. Pt's wife indicated no swallowing issues at this time, but reports pt is experiencing trouble focusing and processing. His wife reported he has difficulty sequencing 1-2 step directions related to his care and often requires repetition. Some intermittent agression also indicated if he becomes over-stimulated. Pt able to appropriately greet SLP; however, pt was restricted in ability to effectively communicate his biographical information or cognitive changes due to confabulation. Due to lethargy and current cognition, no formal standardized testing completed at this time, but SLP adminstered comprehension subtests of Quick Aphasia Battery. Pt exhibited impaired comprehension with pt able to ID 4/8 pictures given verbal prompt. Some delayed processing exhibited for simple yes/no but answers were mostly accurate. As complexity increased, performance declined for mod complex yes/no questions. Inconsistent ability to follow 1-step directions demonstrated. Pt exhibited limited selective and sustained attention, impaired recall with usual confabulation, slow and delayed processing, and poor comprehension. Impaired naming also exhibited with some possibe paraphasias and perseverations exhibited. Given hx of cognitive decline prior to hospitalization and overall functional decline, SLP will  focus on functional cognitive communication impacting pt's comprehension and carryover of necessary task at home.    Speech Therapy Frequency 2x / week    Duration 8 weeks   written for 8 weeks but will conduct trial period of 6 weeks given patient participation and progress   Treatment/Interventions Compensatory strategies;Functional tasks;Patient/family education;Multimodal communcation approach;Compensatory techniques;Internal/external aids;SLP instruction and feedback    Potential to Achieve Goals Fair    Potential Considerations Severity of impairments;Previous level of function;Ability to learn/carryover information;Cooperation/participation level;Medical prognosis    Consulted and Agree with Plan of Care Patient;Family member/caregiver             Patient will benefit from skilled therapeutic intervention in order to improve the following deficits and impairments:   Cognitive communication deficit  Aphasia    Problem List Patient Active Problem List   Diagnosis Date Noted   At high risk for inadequate nutritional intake 07/12/2021   Transaminitis 06/06/2021   Intraparenchymal hematoma of brain 06/04/2021   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HCC) 05/30/2021   Right lower lobe pneumonia 05/30/2021   Prediabetes 05/30/2021   Hyperglycemia 05/30/2021   Intracerebral hemorrhage 05/24/2021   Seizure (HCC) 05/23/2021   Malnutrition of moderate degree 05/22/2021   Memory deficits    OSA (obstructive sleep apnea)    Generalized OA    Hypokalemia    Leukocytosis    Annual physical exam 04/03/2017    Janann Colonel, MA CCC-SLP 09/17/2021, 12:06 PM  [facility] [address] [place], [place], [address] Phone: [phone]   Fax:  [phone]  Name: Hernan Turnage MRN: [id number] Date of Birth: [birth year]

## 2021-09-24 ENCOUNTER — Ambulatory Visit: Payer: No Typology Code available for payment source

## 2021-09-24 ENCOUNTER — Other Ambulatory Visit: Payer: Self-pay

## 2021-09-24 DIAGNOSIS — R2681 Unsteadiness on feet: Secondary | ICD-10-CM

## 2021-09-24 DIAGNOSIS — R2689 Other abnormalities of gait and mobility: Secondary | ICD-10-CM

## 2021-09-24 DIAGNOSIS — R278 Other lack of coordination: Secondary | ICD-10-CM

## 2021-09-24 DIAGNOSIS — M6281 Muscle weakness (generalized): Secondary | ICD-10-CM

## 2021-09-24 DIAGNOSIS — R41841 Cognitive communication deficit: Secondary | ICD-10-CM | POA: Diagnosis not present

## 2021-09-24 DIAGNOSIS — I69154 Hemiplegia and hemiparesis following nontraumatic intracerebral hemorrhage affecting left non-dominant side: Secondary | ICD-10-CM

## 2021-09-24 NOTE — Therapy (Signed)
[facility] [address] [place], [place], [address] Phone: [phone]   Fax:  [phone]  Physical Therapy Treatment  Patient Details  Name: Donald [name] MRN: [id number] Date of Birth: February 17, 1956 Referring Provider (PT): Faith Rogue   Encounter Date: 09/24/2021   PT End of Session - 09/24/21 1213     Visit Number 2    Number of Visits 25    Date for PT Re-Evaluation 12/10/21    Authorization Type [employer]-front office trying to contact insurance company for VL information    Progress Note Due on Visit 10    PT Start Time 1230    PT Stop Time 1315    PT Time Calculation (min) 45 min    Equipment Utilized During Treatment Gait belt    Activity Tolerance Patient tolerated treatment well    Behavior During Therapy Flat affect             Past Medical History:  Diagnosis Date   Arthritis     Past Surgical History:  Procedure Laterality Date   APPENDECTOMY     IR ANGIO INTRA EXTRACRAN SEL COM CAROTID INNOMINATE BILAT MOD SED  05/28/2021   IR ANGIO VERTEBRAL SEL VERTEBRAL BILAT MOD SED  05/28/2021   IR IVC FILTER PLMT / S&I /IMG GUID/MOD SED  05/27/2021   IR US GUIDE VASC ACCESS RIGHT  05/27/2021    There were no vitals filed for this visit.   Subjective Assessment - 09/24/21 1243     Subjective No changes since last session.  CG reports patient has been able o assist more with transfers    Patient is accompained by: Family member   wife   Pertinent History See problem list for PMH    Patient Stated Goals Wife's goal:  get back him to some form of mobility to walk with cane or walker.    Pain Onset More than a month ago                               [facility] Adult PT Treatment/Exercise - 09/24/21 0001       Transfers   Transfers Sit to Stand;Stand to Sit    Sit to Stand 2: Max assist    Sit to Stand Details Manual facilitation for weight  shifting;Manual facilitation for placement;Verbal cues for sequencing;Verbal cues for technique    Stand to Sit 2: Max assist;To bed;To chair/3-in-1    Stand to Sit Details (indicate cue type and reason) Verbal cues for sequencing;Verbal cues for technique;Manual facilitation for weight shifting;Manual facilitation for placement    Comments Standing for 60s with PT support with HW used in R hand      Knee/Hip Exercises: Stretches   Passive Hamstring Stretch Left;1 rep;Limitations    Passive Hamstring Stretch Limitations 2 min static stretch      Knee/Hip Exercises: Seated   Long Arc Quad Right;2 sets;15 reps    Marching 2 sets;15 reps;Right;Limitations    Marching Limitations TCs from PT    Abduction/Adduction  Strengthening;Right;2 sets;15 reps    Abd/Adduction Limitations TCs from PT                 Balance Exercises - 09/24/21 0001       Balance Exercises: Seated   Other Seated Exercises perfomed  weight shifting in sitting to counter extension tendency, support given through both UEs to flex forward as  well as rotate side to side, performed over 5 min duration to fatigue                  PT Short Term Goals - 09/17/21 1324       PT SHORT TERM GOAL #1   Title Pt will perform HEP with family supervision for improved strength, balance, transfers, and posture for improved mobility.  TARGET 10/15/2021    Time 4    Period Weeks    Status New      PT SHORT TERM GOAL #2   Title Pt will perform squat/stand pivot transfer w/c<>mat with mod assist for decreased caregiver burden.    Baseline max assist    Time 4    Period Weeks    Status New      PT SHORT TERM GOAL #3   Title Pt will sit edge of mat, with RUE support and supervision at least 5 minutes, for improved participation with ADLs.    Time 4    Period Weeks    Status New      PT SHORT TERM GOAL #4   Title Pt will perform standing at sink/parallel bars, x 2 minutes, with mod assist, for improved  participation in ADLs.    Time 4    Period Weeks    Status New               PT Long Term Goals - 09/17/21 1328       PT LONG TERM GOAL #1   Title Pt will perform progression of HEP with family supervision for improved strength, balance, transfers, and posture.  TARGET 12/10/2021    Time 12    Period Weeks    Status New      PT LONG TERM GOAL #2   Title Pt will perform stand pivot transfer w/c<>mat with min assist for improved mobility, decreased caregiver burden.    Time 12    Period Weeks    Status New      PT LONG TERM GOAL #3   Title Pt will perform at least 5 minutes of standing at sink, with min assist, for improved participation in ADLs.    Time 12    Period Weeks    Status New      PT LONG TERM GOAL #4   Title Further gait assessment as strength and standing tolerance progresses, with goal to be written as appropriate.                   Plan - 09/24/21 1251     Clinical Impression Statement Todays session focused on R LE strength and mobility with PT assist required for patient to move limb through space, no volitional movement on LLE with edema present throughout due to DVT.  treatment performed in sitting and tried to counter trunk extensor tone and retropulsion, max assist to stand and remain standing for up to 60s on 2 trials, incorporated WS through UEs HHA into flexion an drotation to facilitate motion and impreve balance.    Personal Factors and Comorbidities Comorbidity 2    Comorbidities Significant PMH: memory deficits, OSA.    Examination-Activity Limitations Locomotion Level;Transfers;Sit;Stand;Dressing;Toileting;Hygiene/Grooming;Bed Mobility;Bathing    Examination-Participation Restrictions Occupation;Community Activity;Church    Stability/Clinical Decision Making Evolving/Moderate complexity    Rehab Potential Fair    PT Frequency 2x / week    PT Duration 12 weeks   plus eval   PT Treatment/Interventions ADLs/Self Care Home  Management;Functional mobility training;Therapeutic activities;Therapeutic  exercise;Balance training;Patient/family education;Orthotic Fit/Training;Wheelchair mobility training;Manual techniques;Passive range of motion;Neuromuscular re-education    PT Next Visit Plan Initiate HEP, utilize seated stepper for flexibility, strength; standing at standing frame or at sink/parallel bars (pt has Stedy at home).  Work on LLE neuro-reed, flexibility, weightshifting; posture and sitting balance and weight shift to counter extensor tone in trunk    PT Home Exercise Plan sitting balance WS tasks demoed by PT to CG    Consulted and Agree with Plan of Care Patient;Family member/caregiver             Patient will benefit from skilled therapeutic intervention in order to improve the following deficits and impairments:  Decreased range of motion, Impaired tone, Difficulty walking, Decreased activity tolerance, Decreased balance, Impaired flexibility, Decreased mobility, Decreased strength, Postural dysfunction  Visit Diagnosis: Unsteadiness on feet  Other lack of coordination  Muscle weakness (generalized)  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)  Other abnormalities of gait and mobility     Problem List Patient Active Problem List   Diagnosis Date Noted   At high risk for inadequate nutritional intake 07/12/2021   Transaminitis 06/06/2021   Intraparenchymal hematoma of brain 06/04/2021   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HCC) 05/30/2021   Right lower lobe pneumonia 05/30/2021   Prediabetes 05/30/2021   Hyperglycemia 05/30/2021   Intracerebral hemorrhage 05/24/2021   Seizure (HCC) 05/23/2021   Malnutrition of moderate degree 05/22/2021   Memory deficits    OSA (obstructive sleep apnea)    Generalized OA    Hypokalemia    Leukocytosis    Annual physical exam 04/03/2017    Hildred Laser, PT 09/24/2021, 1:25 PM  [facility] [address] [place], [place], [address] Phone: [phone]   Fax:  [phone]  Name: Donald [name] MRN: [id number] Date of Birth: 1956/06/09

## 2021-09-27 ENCOUNTER — Ambulatory Visit: Payer: No Typology Code available for payment source | Admitting: Occupational Therapy

## 2021-09-27 ENCOUNTER — Encounter: Payer: Self-pay | Admitting: Occupational Therapy

## 2021-09-27 ENCOUNTER — Other Ambulatory Visit: Payer: Self-pay

## 2021-09-27 DIAGNOSIS — M25612 Stiffness of left shoulder, not elsewhere classified: Secondary | ICD-10-CM

## 2021-09-27 DIAGNOSIS — R41840 Attention and concentration deficit: Secondary | ICD-10-CM

## 2021-09-27 DIAGNOSIS — R278 Other lack of coordination: Secondary | ICD-10-CM

## 2021-09-27 DIAGNOSIS — R41841 Cognitive communication deficit: Secondary | ICD-10-CM | POA: Diagnosis not present

## 2021-09-27 DIAGNOSIS — I69154 Hemiplegia and hemiparesis following nontraumatic intracerebral hemorrhage affecting left non-dominant side: Secondary | ICD-10-CM

## 2021-09-27 DIAGNOSIS — R2681 Unsteadiness on feet: Secondary | ICD-10-CM

## 2021-09-27 DIAGNOSIS — M25622 Stiffness of left elbow, not elsewhere classified: Secondary | ICD-10-CM

## 2021-09-27 DIAGNOSIS — M6281 Muscle weakness (generalized): Secondary | ICD-10-CM

## 2021-09-27 DIAGNOSIS — M25632 Stiffness of left wrist, not elsewhere classified: Secondary | ICD-10-CM

## 2021-09-27 DIAGNOSIS — R41842 Visuospatial deficit: Secondary | ICD-10-CM

## 2021-09-27 NOTE — Therapy (Signed)
[facility] [address] [place], [place], [address] Phone: [phone]   Fax:  [phone]  Occupational Therapy Treatment  Patient Details  Name: Donald [name] MRN: [id number] Date of Birth: [birth year] Referring Provider (OT): Dr. Riley Kill   Encounter Date: 09/27/2021   OT End of Session - 09/27/21 1023     Visit Number 2    Number of Visits 25    Date for OT Re-Evaluation 12/09/21    Authorization Type [employer]    Authorization Time Period goals written for 12 weeks, pt scheduled only for 6 week trial at this time    OT Start Time 1019    OT Stop Time 1100    OT Time Calculation (min) 41 min    Activity Tolerance Patient limited by fatigue;Patient limited by lethargy    Behavior During Therapy Flat affect             Past Medical History:  Diagnosis Date   Arthritis     Past Surgical History:  Procedure Laterality Date   APPENDECTOMY     IR ANGIO INTRA EXTRACRAN SEL COM CAROTID INNOMINATE BILAT MOD SED  05/28/2021   IR ANGIO VERTEBRAL SEL VERTEBRAL BILAT MOD SED  05/28/2021   IR IVC FILTER PLMT / S&I /IMG GUID/MOD SED  05/27/2021   IR US GUIDE VASC ACCESS RIGHT  05/27/2021    There were no vitals filed for this visit.   Subjective Assessment - 09/27/21 1022     Subjective  Pt reports a little bit of pain in RLE (thigh).    Pertinent History 65 y.o. male hospitlized with  ICH R frontal lobe and small adjacent  SAH, pt transferred to rehab then he exeperienced worsening midline shift of R MCA, R ACA and DVT in RLE    Limitations hx of seizure, no estim without MD clearance, hx of memory deficit OSA    Patient Stated Goals be more independent    Currently in Pain? Yes    Pain Score --   unable to accurately rate   Pain Location Leg    Pain Orientation Right              Reviewed Goals   Manual Therapy PROM to LUE hand, wrist and elbow and shoulder. Pt very limited in composite flexion but made  gains with PROM during manual therapy during session. Pt did not demonstrate any active movement this day. Limited by attention, as well.   Visual Scanning/ Attention worked on scanning to the left for colored cones with max visual, auditory and verbal cueing. Pt with very poor visual attention to left side.                       OT Short Term Goals - 09/16/21 1502       OT SHORT TERM GOAL #1   Title Pt's family with be I with inital HEP.    Time 6    Period Weeks    Status New    Target Date 10/28/21      OT SHORT TERM GOAL #2   Title Pt's family with be I with splint wear, care and precautions and LUE positioning to minimize pain and contracture.    Time 6    Period Weeks    Status New      OT SHORT TERM GOAL #3   Title Pt will consistently wash face and chest with no more than min A.  Time 6    Period Weeks    Status New      OT SHORT TERM GOAL #4   Title Pt will attend to left hemibody space at least 10% of the time with no more than mod v.c    Time 6    Period Weeks    Status New      OT SHORT TERM GOAL #5   Title Pt will assist with pulling down shirt in front at least 25% of them with min v.c    Time 6    Period Weeks    Status New      OT SHORT TERM GOAL #6   Title Pt will follow a simple  1 step command 75% of the time during therapy.    Time 6    Period Weeks    Status New               OT Long Term Goals - 09/16/21 1513       OT LONG TERM GOAL #1   Title Pt will donn shirt with mod A, and mod v.c    Time 12    Period Weeks    Status New      OT LONG TERM GOAL #2   Title Pt will attend to L hemibody space during ADLs and functional activities at least 25% of the time with min v.c    Time 12    Period Weeks    Status New      OT LONG TERM GOAL #3   Title Pt will perfom UB bathing with min A    Time 12    Period Weeks    Status New      OT LONG TERM GOAL #4   Title Pt will attend to a simple ADL or functional task  for at least 15 mins with no more than min v.c    Time 12    Period Weeks    Status New      OT LONG TERM GOAL #5   Title Pt will tolerate P/ROM shoulder flexion to 90* with pain no greater than 2/10 in prep for dressing.    Time 12    Period Weeks    Status New      Long Term Additional Goals   Additional Long Term Goals Yes      OT LONG TERM GOAL #6   Title Pt will perform toilet transfers with mod A.    Time 12    Period Weeks    Status New                   Plan - 09/27/21 1110     Clinical Impression Statement Reviewed goals with pt and spouse. Pt/spouse verbalized understanding and agreement to goals set at evaluation.    OT Occupational Profile and History Detailed Assessment- Review of Records and additional review of physical, cognitive, psychosocial history related to current functional performance    Occupational performance deficits (Please refer to evaluation for details): ADL's;IADL's;Rest and Sleep;Play;Leisure;Social Participation    Body Structure / Function / Physical Skills ADL;UE functional use;Flexibility;Pain;Vision;FMC;Proprioception;ROM;Gait;Coordination;GMC;Sensation;Decreased knowledge of precautions;Decreased knowledge of use of DME;Dexterity;Mobility;Tone;Strength;IADL    Cognitive Skills Attention;Energy/Drive;Memory;Orientation;Perception;Problem Solve;Safety Awareness;Sequencing;Thought;Understand    Rehab Potential Good    Clinical Decision Making Several treatment options, min-mod task modification necessary    Comorbidities Affecting Occupational Performance: May have comorbidities impacting occupational performance    Modification or Assistance to Complete Evaluation  Min-Moderate modification  of tasks or assist with assess necessary to complete eval    OT Frequency 2x / week   plus eval   OT Duration 12 weeks    OT Treatment/Interventions Self-care/ADL training;Ultrasound;Visual/perceptual remediation/compensation;Patient/family  education;Balance training;Passive range of motion;Gait Training;Paraffin;Cryotherapy;Fluidtherapy;Splinting;Building services engineer;Contrast Bath;Moist Heat;Therapeutic exercise;Manual Therapy;Therapeutic activities;Cognitive remediation/compensation;Neuromuscular education    Plan simple functional activities to increase L side awreness, education regarding positioning to minimize pain and contracture, custom resting hand splint for LUE    Consulted and Agree with Plan of Care Patient;Family member/caregiver             Patient will benefit from skilled therapeutic intervention in order to improve the following deficits and impairments:   Body Structure / Function / Physical Skills: ADL, UE functional use, Flexibility, Pain, Vision, FMC, Proprioception, ROM, Gait, Coordination, GMC, Sensation, Decreased knowledge of precautions, Decreased knowledge of use of DME, Dexterity, Mobility, Tone, Strength, IADL Cognitive Skills: Attention, Energy/Drive, Memory, Orientation, Perception, Problem Solve, Safety Awareness, Sequencing, Thought, Understand     Visit Diagnosis: Other lack of coordination  Stiffness of left shoulder, not elsewhere classified  Unsteadiness on feet  Muscle weakness (generalized)  Stiffness of left elbow, not elsewhere classified  Stiffness of left wrist, not elsewhere classified  Attention and concentration deficit  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)  Visuospatial deficit    Problem List Patient Active Problem List   Diagnosis Date Noted   At high risk for inadequate nutritional intake 07/12/2021   Transaminitis 06/06/2021   Intraparenchymal hematoma of brain 06/04/2021   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HCC) 05/30/2021   Right lower lobe pneumonia 05/30/2021   Prediabetes 05/30/2021   Hyperglycemia 05/30/2021   Intracerebral hemorrhage 05/24/2021   Seizure (HCC) 05/23/2021    Malnutrition of moderate degree 05/22/2021   Memory deficits    OSA (obstructive sleep apnea)    Generalized OA    Hypokalemia    Leukocytosis    Annual physical exam 04/03/2017    Junious Dresser, OT/L 09/27/2021, 11:13 AM  [facility] [address] [place], [place], [address] Phone: [phone]   Fax:  [phone]  Name: Ival Pacer MRN: [id number] Date of Birth: 1956-06-09

## 2021-09-29 ENCOUNTER — Ambulatory Visit: Payer: No Typology Code available for payment source

## 2021-09-29 ENCOUNTER — Telehealth: Payer: Self-pay | Admitting: Neurology

## 2021-09-29 NOTE — Telephone Encounter (Signed)
MR Brain w/wo contrast Dr. Pearlean Brownie [facility] Marylee Floras: [id number] (exp. 09/29/21 to 12/28/21). Patient is scheduled at [facility] for 10/06/21.

## 2021-10-01 ENCOUNTER — Telehealth: Payer: Self-pay | Admitting: Family Medicine

## 2021-10-01 DIAGNOSIS — D649 Anemia, unspecified: Secondary | ICD-10-CM

## 2021-10-01 NOTE — Telephone Encounter (Signed)
Called and notified patient that he can have his labs done at [facility] prior to his visit with Dr Neva Seat. Spoke with wife and wife understood. No further concerns at this time.

## 2021-10-01 NOTE — Telephone Encounter (Signed)
CBC ordered as future lab to have performed prior to that visit.  Thanks.

## 2021-10-01 NOTE — Telephone Encounter (Signed)
Pt had to change appt to a mychart visit due to having another appt later the same day. He wanted to know if he is going to need bw done? If so can orders be placed and he can go to the [facility] to have those done.

## 2021-10-01 NOTE — Telephone Encounter (Signed)
Will patient need labs with his appt with you? If so can they ordered and patient have them done at [facility]. Please advise

## 2021-10-06 ENCOUNTER — Ambulatory Visit: Payer: No Typology Code available for payment source | Admitting: Occupational Therapy

## 2021-10-06 ENCOUNTER — Encounter: Payer: Self-pay | Admitting: Occupational Therapy

## 2021-10-06 ENCOUNTER — Other Ambulatory Visit (INDEPENDENT_AMBULATORY_CARE_PROVIDER_SITE_OTHER): Payer: No Typology Code available for payment source

## 2021-10-06 ENCOUNTER — Encounter: Payer: Self-pay | Admitting: Physical Therapy

## 2021-10-06 ENCOUNTER — Other Ambulatory Visit: Payer: Self-pay

## 2021-10-06 ENCOUNTER — Ambulatory Visit: Payer: No Typology Code available for payment source | Admitting: Physical Therapy

## 2021-10-06 ENCOUNTER — Ambulatory Visit: Payer: No Typology Code available for payment source

## 2021-10-06 DIAGNOSIS — R41842 Visuospatial deficit: Secondary | ICD-10-CM

## 2021-10-06 DIAGNOSIS — I69154 Hemiplegia and hemiparesis following nontraumatic intracerebral hemorrhage affecting left non-dominant side: Secondary | ICD-10-CM

## 2021-10-06 DIAGNOSIS — R293 Abnormal posture: Secondary | ICD-10-CM

## 2021-10-06 DIAGNOSIS — D649 Anemia, unspecified: Secondary | ICD-10-CM

## 2021-10-06 DIAGNOSIS — M6281 Muscle weakness (generalized): Secondary | ICD-10-CM

## 2021-10-06 DIAGNOSIS — R41840 Attention and concentration deficit: Secondary | ICD-10-CM

## 2021-10-06 DIAGNOSIS — R2681 Unsteadiness on feet: Secondary | ICD-10-CM

## 2021-10-06 DIAGNOSIS — R278 Other lack of coordination: Secondary | ICD-10-CM

## 2021-10-06 DIAGNOSIS — M25632 Stiffness of left wrist, not elsewhere classified: Secondary | ICD-10-CM

## 2021-10-06 DIAGNOSIS — M25622 Stiffness of left elbow, not elsewhere classified: Secondary | ICD-10-CM

## 2021-10-06 DIAGNOSIS — M25612 Stiffness of left shoulder, not elsewhere classified: Secondary | ICD-10-CM

## 2021-10-06 DIAGNOSIS — R41841 Cognitive communication deficit: Secondary | ICD-10-CM | POA: Diagnosis not present

## 2021-10-06 LAB — CBC
HCT: 40.2 % (ref 39.0–52.0)
Hemoglobin: 13.1 g/dL (ref 13.0–17.0)
MCHC: 32.7 g/dL (ref 30.0–36.0)
MCV: 90.7 fl (ref 78.0–100.0)
Platelets: 305 10*3/uL (ref 150.0–400.0)
RBC: 4.43 Mil/uL (ref 4.22–5.81)
RDW: 14.6 % (ref 11.5–15.5)
WBC: 4.1 10*3/uL (ref 4.0–10.5)

## 2021-10-06 NOTE — Telephone Encounter (Signed)
Patient was unable to have his MRI on the mobile unit because he is in a wheelchair and needs a lift.

## 2021-10-06 NOTE — Therapy (Signed)
[facility] [address] [place], [place], [address] Phone: [phone]   Fax:  [phone]  Occupational Therapy Treatment  Patient Details  Name: Donald [name] MRN: [id number] Date of Birth: 08/30/1956 Referring Provider (OT): Dr. Riley Kill   Encounter Date: 10/06/2021   OT End of Session - 10/06/21 1316     Visit Number 3    Number of Visits 25    Date for OT Re-Evaluation 12/09/21    Authorization Type U[employer]    Authorization Time Period goals written for 12 weeks, pt scheduled only for 6 week trial at this time    OT Start Time 1313    OT Stop Time 1400    OT Time Calculation (min) 47 min    Activity Tolerance Patient limited by fatigue;Patient limited by lethargy    Behavior During Therapy Flat affect             Past Medical History:  Diagnosis Date   Arthritis     Past Surgical History:  Procedure Laterality Date   APPENDECTOMY     IR ANGIO INTRA EXTRACRAN SEL COM CAROTID INNOMINATE BILAT MOD SED  05/28/2021   IR ANGIO VERTEBRAL SEL VERTEBRAL BILAT MOD SED  05/28/2021   IR IVC FILTER PLMT / S&I /IMG GUID/MOD SED  05/27/2021   IR US GUIDE VASC ACCESS RIGHT  05/27/2021    There were no vitals filed for this visit.   Subjective Assessment - 10/06/21 1316     Subjective  "let me pull myself together"    Patient is accompanied by: Family member    Pertinent History 65 y.o. male hospitlized with  ICH R frontal lobe and small adjacent  SAH, pt transferred to rehab then he exeperienced worsening midline shift of R MCA, R ACA and DVT in RLE    Limitations hx of seizure, no estim without MD clearance, hx of memory deficit OSA    Patient Stated Goals be more independent    Currently in Pain? No/denies    Pain Score 0-No pain             Left Attention with semicircle pegboard with max visual, auditory, verbal and tactile cues for attending to left side and following commands. Pt only did white pegs and  req'd increased time and hand over hand assistance at times.  Sitting Balance on edge of mat with SBA to min A at times. Pt with limited hip flexion with sitting at mat and inability to lean forward for transfer. Worked on sitting balance and tolerance, left attention and leaning forward while seated edge of mat. Pt with significant and severe neck flexion and left lateral flexion and difficulty with upright visual gaze and attention. Encouragement to keep head up, lean forward and look to left consistently.   Manual Therapy PROM to LUE a shoulder, elbow, wrist and hand                       OT Short Term Goals - 10/06/21 1417       OT SHORT TERM GOAL #1   Title Pt's family with be I with inital HEP.    Time 6    Period Weeks    Status New    Target Date 10/28/21      OT SHORT TERM GOAL #2   Title Pt's family with be I with splint wear, care and precautions and LUE positioning to minimize pain and contracture.  Time 6    Period Weeks    Status New      OT SHORT TERM GOAL #3   Title Pt will consistently wash face and chest with no more than min A.    Time 6    Period Weeks    Status New      OT SHORT TERM GOAL #4   Title Pt will attend to left hemibody space at least 10% of the time with no more than mod v.c    Time 6    Period Weeks    Status On-going      OT SHORT TERM GOAL #5   Title Pt will assist with pulling down shirt in front at least 25% of them with min v.c    Time 6    Period Weeks    Status New      OT SHORT TERM GOAL #6   Title Pt will follow a simple  1 step command 75% of the time during therapy.    Time 6    Period Weeks    Status On-going               OT Long Term Goals - 09/16/21 1513       OT LONG TERM GOAL #1   Title Pt will donn shirt with mod A, and mod v.c    Time 12    Period Weeks    Status New      OT LONG TERM GOAL #2   Title Pt will attend to L hemibody space during ADLs and functional activities at least  25% of the time with min v.c    Time 12    Period Weeks    Status New      OT LONG TERM GOAL #3   Title Pt will perfom UB bathing with min A    Time 12    Period Weeks    Status New      OT LONG TERM GOAL #4   Title Pt will attend to a simple ADL or functional task for at least 15 mins with no more than min v.c    Time 12    Period Weeks    Status New      OT LONG TERM GOAL #5   Title Pt will tolerate P/ROM shoulder flexion to 90* with pain no greater than 2/10 in prep for dressing.    Time 12    Period Weeks    Status New      Long Term Additional Goals   Additional Long Term Goals Yes      OT LONG TERM GOAL #6   Title Pt will perform toilet transfers with mod A.    Time 12    Period Weeks    Status New                   Plan - 10/06/21 1437     Clinical Impression Statement Pt with good sitting balance on edge of mat today with SBA. Pt more alert out of chair. Continues to need max cues for left attention.    OT Occupational Profile and History Detailed Assessment- Review of Records and additional review of physical, cognitive, psychosocial history related to current functional performance    Occupational performance deficits (Please refer to evaluation for details): ADL's;IADL's;Rest and Sleep;Play;Leisure;Social Participation    Body Structure / Function / Physical Skills ADL;UE functional use;Flexibility;Pain;Vision;FMC;Proprioception;ROM;Gait;Coordination;GMC;Sensation;Decreased knowledge of precautions;Decreased knowledge of use of DME;Dexterity;Mobility;Tone;Strength;IADL  Cognitive Skills Attention;Energy/Drive;Memory;Orientation;Perception;Problem Solve;Safety Awareness;Sequencing;Thought;Understand    Rehab Potential Good    Clinical Decision Making Several treatment options, min-mod task modification necessary    Comorbidities Affecting Occupational Performance: May have comorbidities impacting occupational performance    Modification or Assistance  to Complete Evaluation  Min-Moderate modification of tasks or assist with assess necessary to complete eval    OT Frequency 2x / week   plus eval   OT Duration 12 weeks    OT Treatment/Interventions Self-care/ADL training;Ultrasound;Visual/perceptual remediation/compensation;Patient/family education;Balance training;Passive range of motion;Gait Training;Paraffin;Cryotherapy;Fluidtherapy;Splinting;Building services engineer;Contrast Bath;Moist Heat;Therapeutic exercise;Manual Therapy;Therapeutic activities;Cognitive remediation/compensation;Neuromuscular education    Plan simple functional activities to increase L side awreness, education regarding positioning to minimize pain and contracture, custom resting hand splint for LUE    Consulted and Agree with Plan of Care Patient;Family member/caregiver             Patient will benefit from skilled therapeutic intervention in order to improve the following deficits and impairments:   Body Structure / Function / Physical Skills: ADL, UE functional use, Flexibility, Pain, Vision, FMC, Proprioception, ROM, Gait, Coordination, GMC, Sensation, Decreased knowledge of precautions, Decreased knowledge of use of DME, Dexterity, Mobility, Tone, Strength, IADL Cognitive Skills: Attention, Energy/Drive, Memory, Orientation, Perception, Problem Solve, Safety Awareness, Sequencing, Thought, Understand     Visit Diagnosis: Other lack of coordination  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)  Stiffness of left shoulder, not elsewhere classified  Visuospatial deficit  Unsteadiness on feet  Stiffness of left elbow, not elsewhere classified  Stiffness of left wrist, not elsewhere classified  Muscle weakness (generalized)  Attention and concentration deficit    Problem List Patient Active Problem List   Diagnosis Date Noted   At high risk for inadequate nutritional intake  07/12/2021   Transaminitis 06/06/2021   Intraparenchymal hematoma of brain 06/04/2021   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HCC) 05/30/2021   Right lower lobe pneumonia 05/30/2021   Prediabetes 05/30/2021   Hyperglycemia 05/30/2021   Intracerebral hemorrhage 05/24/2021   Seizure (HCC) 05/23/2021   Malnutrition of moderate degree 05/22/2021   Memory deficits    OSA (obstructive sleep apnea)    Generalized OA    Hypokalemia    Leukocytosis    Annual physical exam 04/03/2017    Junious Dresser, OT/L 10/06/2021, 2:41 PM  [facility] [facility] [address] [place], [place], [address] Phone: [phone]   Fax:  [phone]  Name: Amour Cutrone MRN: [id number] Date of Birth: 12-May-1956

## 2021-10-07 NOTE — Therapy (Signed)
[facility] [address] [place], [place], [address] Phone: [phone]   Fax:  [phone]  Physical Therapy Treatment  Patient Details  Name: Donald [name] MRN: [id number] Date of Birth: Jan 01, 1956 Referring Provider (PT): Faith Rogue   Encounter Date: 10/06/2021   PT End of Session - 10/06/21 1404     Visit Number 3    Number of Visits 25    Date for PT Re-Evaluation 12/10/21    Authorization Type [employer]-front office trying to contact insurance company for VL information    Progress Note Due on Visit 10    PT Start Time 1402    PT Stop Time 1443    PT Time Calculation (min) 41 min    Equipment Utilized During Treatment Gait belt    Activity Tolerance Patient tolerated treatment well    Behavior During Therapy Flat affect             Past Medical History:  Diagnosis Date   Arthritis     Past Surgical History:  Procedure Laterality Date   APPENDECTOMY     IR ANGIO INTRA EXTRACRAN SEL COM CAROTID INNOMINATE BILAT MOD SED  05/28/2021   IR ANGIO VERTEBRAL SEL VERTEBRAL BILAT MOD SED  05/28/2021   IR IVC FILTER PLMT / S&I /IMG GUID/MOD SED  05/27/2021   IR US GUIDE VASC ACCESS RIGHT  05/27/2021    There were no vitals filed for this visit.   Subjective Assessment - 10/06/21 1404     Patient is accompained by: Family member   spouse              10/06/21 0001  Transfers  Transfers Sit to Stand;Stand to Sit  Sit to Stand 2: Max assist;With upper extremity assist;From bed;From chair/3-in-1  Sit to Stand Details Manual facilitation for weight shifting;Manual facilitation for placement;Verbal cues for sequencing;Verbal cues for technique  Stand to Sit 2: Max assist;With upper extremity assist;To bed;To chair/3-in-1  Stand to Sit Details (indicate cue type and reason) Verbal cues for sequencing;Verbal cues for technique;Manual facilitation for weight shifting;Manual  facilitation for placement  Squat Pivot Transfers 2: Max assist  Squat Pivot Transfer Details (indicate cue type and reason) mat table to wheelchair with knees blocked, minimal assist with UE's  Comments performed sit<>stand x 4 reps total in session  Neuro Re-ed   Neuro Re-ed Details  for postural re-education/strengthening/improved sitting balance at edge of mat: working on lateral elbow propping to mat table<>back to sitting on both sides with cues assist/faciliation needed to perform transitional movmenets; working toward increased anterior weight shifitng via rocking, having pt "push" PTA away, having pt reach for chair arm of chair in front of hip. cues to task with faciliation for movements/weight shifting needed. pt with tendency for posterior lean (? extensor tone) and/or to push back with right UE; standing x 3 reps next to mat table with 2 person min<>mod asisst (PTA on left, student PTA on right)- working on tall standing with cues/facilitation at pelvis/trunk and overpressure/cues for left knee extension in stance, progressing to working on lateral weight shifitng, then further progressing to right foot forward/backward stepping with blocking on left knee. attempted to have pt step left foot without success even with assistance.  Exercises  Exercises Other Exercises  Other Exercises  supine on mat table working on passive stretching of bil heel cords, hamstrings and hip adductors with significant tightness/spasticity noted left > right LE. Spouse reports they stretch "a  lot" at home.          PT Short Term Goals - 09/17/21 1324       PT SHORT TERM GOAL #1   Title Pt will perform HEP with family supervision for improved strength, balance, transfers, and posture for improved mobility.  TARGET 10/15/2021    Time 4    Period Weeks    Status New      PT SHORT TERM GOAL #2   Title Pt will perform squat/stand pivot transfer w/c<>mat with mod assist for decreased caregiver burden.     Baseline max assist    Time 4    Period Weeks    Status New      PT SHORT TERM GOAL #3   Title Pt will sit edge of mat, with RUE support and supervision at least 5 minutes, for improved participation with ADLs.    Time 4    Period Weeks    Status New      PT SHORT TERM GOAL #4   Title Pt will perform standing at sink/parallel bars, x 2 minutes, with mod assist, for improved participation in ADLs.    Time 4    Period Weeks    Status New               PT Long Term Goals - 09/17/21 1328       PT LONG TERM GOAL #1   Title Pt will perform progression of HEP with family supervision for improved strength, balance, transfers, and posture.  TARGET 12/10/2021    Time 12    Period Weeks    Status New      PT LONG TERM GOAL #2   Title Pt will perform stand pivot transfer w/c<>mat with min assist for improved mobility, decreased caregiver burden.    Time 12    Period Weeks    Status New      PT LONG TERM GOAL #3   Title Pt will perform at least 5 minutes of standing at sink, with min assist, for improved participation in ADLs.    Time 12    Period Weeks    Status New      PT LONG TERM GOAL #4   Title Further gait assessment as strength and standing tolerance progresses, with goal to be written as appropriate.                   Plan - 10/06/21 1404     Clinical Impression Statement Todays's skilled session continued to focus on LE stretching to attempt to address tone, sitting balance/posture, transfer training adn standing balance/posture. Pt continues to present with extensor tone of trunk and LE's. Pt with decreased initiation with movements needing cues/facilitation and noted to need increased assist to allow for bil hip flexion with sitting down each time. The pt should benefit from continued PT to progress mobility toward goals.    Personal Factors and Comorbidities Comorbidity 2    Comorbidities Significant PMH: memory deficits, OSA.    Examination-Activity  Limitations Locomotion Level;Transfers;Sit;Stand;Dressing;Toileting;Hygiene/Grooming;Bed Mobility;Bathing    Examination-Participation Restrictions Occupation;Community Activity;Church    Stability/Clinical Decision Making Evolving/Moderate complexity    Rehab Potential Fair    PT Frequency 2x / week    PT Duration 12 weeks   plus eval   PT Treatment/Interventions ADLs/Self Care Home Management;Functional mobility training;Therapeutic activities;Therapeutic exercise;Balance training;Patient/family education;Orthotic Fit/Training;Wheelchair mobility training;Manual techniques;Passive range of motion;Neuromuscular re-education    PT Next Visit Plan utilize seated stepper for flexibility, strength;  standing at standing frame or at sink/parallel bars (pt has Stedy at home).  Work on LLE neuro-reed, flexibility, weightshifting; posture and sitting balance and weight shift to counter extensor tone in trunk    PT Home Exercise Plan sitting balance WS tasks demoed by PT to CG    Consulted and Agree with Plan of Care Patient;Family member/caregiver             Patient will benefit from skilled therapeutic intervention in order to improve the following deficits and impairments:  Decreased range of motion, Impaired tone, Difficulty walking, Decreased activity tolerance, Decreased balance, Impaired flexibility, Decreased mobility, Decreased strength, Postural dysfunction  Visit Diagnosis: Unsteadiness on feet  Muscle weakness (generalized)  Abnormal posture     Problem List Patient Active Problem List   Diagnosis Date Noted   At high risk for inadequate nutritional intake 07/12/2021   Transaminitis 06/06/2021   Intraparenchymal hematoma of brain 06/04/2021   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HCC) 05/30/2021   Right lower lobe pneumonia 05/30/2021   Prediabetes 05/30/2021   Hyperglycemia 05/30/2021   Intracerebral hemorrhage 05/24/2021   Seizure (HCC) 05/23/2021   Malnutrition of moderate degree  05/22/2021   Memory deficits    OSA (obstructive sleep apnea)    Generalized OA    Hypokalemia    Leukocytosis    Annual physical exam 04/03/2017    Sallyanne Kuster, PTA, [facility] [address], [address] [place], [place] [address] [phone] 10/07/21, 4:10 PM   Name: Donald [name] MRN: [id number] Date of Birth: 17-Mar-1956

## 2021-10-11 ENCOUNTER — Telehealth (INDEPENDENT_AMBULATORY_CARE_PROVIDER_SITE_OTHER): Payer: No Typology Code available for payment source | Admitting: Family Medicine

## 2021-10-11 ENCOUNTER — Telehealth: Payer: Self-pay | Admitting: Neurology

## 2021-10-11 ENCOUNTER — Other Ambulatory Visit: Payer: Self-pay

## 2021-10-11 ENCOUNTER — Encounter
Payer: No Typology Code available for payment source | Attending: Physical Medicine & Rehabilitation | Admitting: Registered Nurse

## 2021-10-11 ENCOUNTER — Encounter: Payer: Self-pay | Admitting: Family Medicine

## 2021-10-11 ENCOUNTER — Ambulatory Visit: Payer: No Typology Code available for payment source | Admitting: Family Medicine

## 2021-10-11 VITALS — BP 131/91 | HR 92

## 2021-10-11 VITALS — BP 141/88 | HR 95

## 2021-10-11 DIAGNOSIS — I611 Nontraumatic intracerebral hemorrhage in hemisphere, cortical: Secondary | ICD-10-CM | POA: Diagnosis not present

## 2021-10-11 DIAGNOSIS — G4733 Obstructive sleep apnea (adult) (pediatric): Secondary | ICD-10-CM | POA: Diagnosis not present

## 2021-10-11 DIAGNOSIS — D649 Anemia, unspecified: Secondary | ICD-10-CM | POA: Diagnosis not present

## 2021-10-11 DIAGNOSIS — G8194 Hemiplegia, unspecified affecting left nondominant side: Secondary | ICD-10-CM | POA: Diagnosis not present

## 2021-10-11 DIAGNOSIS — I69152 Hemiplegia and hemiparesis following nontraumatic intracerebral hemorrhage affecting left dominant side: Secondary | ICD-10-CM | POA: Diagnosis not present

## 2021-10-11 DIAGNOSIS — S06310S Contusion and laceration of right cerebrum without loss of consciousness, sequela: Secondary | ICD-10-CM

## 2021-10-11 DIAGNOSIS — R030 Elevated blood-pressure reading, without diagnosis of hypertension: Secondary | ICD-10-CM

## 2021-10-11 MED ORDER — AMLODIPINE BESYLATE 2.5 MG PO TABS: 2.5000 mg | ORAL_TABLET | Freq: Every day | ORAL | 0 refills | Status: DC

## 2021-10-11 NOTE — Patient Instructions (Addendum)
Keep a record of your blood pressures outside of the office and bring them to the next office visit. If blood pressure is running over 140 on upper number or 90 on the lower number, start the amlodipine that was sent to pharmacy. Keep me posted if that is needed. Follow up when able for mouth guard for sleep apnea.    How to Take Your Blood Pressure Blood pressure is a measurement of how strongly your blood is pressing against the walls of your arteries. Arteries are blood vessels that carry blood from your heart throughout your body. Your health care provider takes your blood pressure at each office visit. You can also take your own blood pressure at home with a blood pressure monitor. You may need to take your own blood pressure to: Confirm a diagnosis of high blood pressure (hypertension). Monitor your blood pressure over time. Make sure your blood pressure medicine is working. Supplies needed: Blood pressure monitor. Dining room chair to sit in. Table or desk. Small notebook and pencil or pen. How to prepare To get the most accurate reading, avoid the following for 30 minutes before you check your blood pressure: Drinking caffeine. Drinking alcohol. Eating. Smoking. Exercising. Five minutes before you check your blood pressure: Use the bathroom and urinate so that you have an empty bladder. Sit quietly in a dining room chair. Do not sit in a soft couch or an armchair. Do not talk. How to take your blood pressure To check your blood pressure, follow the instructions in the manual that came with your blood pressure monitor. If you have a digital blood pressure monitor, the instructions may be as follows: Sit up straight in a chair. Place your feet on the floor. Do not cross your ankles or legs. Rest your left arm at the level of your heart on a table or desk or on the arm of a chair. Pull up your shirt sleeve. Wrap the blood pressure cuff around the upper part of your left arm, 1  inch (2.5 cm) above your elbow. It is best to wrap the cuff around bare skin. Fit the cuff snugly around your arm. You should be able to place only one finger between the cuff and your arm. Position the cord so that it rests in the bend of your elbow. Press the power button. Sit quietly while the cuff inflates and deflates. Read the digital reading on the monitor screen and write the numbers down (record them) in a notebook. Wait 2-3 minutes, then repeat the steps, starting at step 1. What does my blood pressure reading mean? A blood pressure reading consists of a higher number over a lower number. Ideally, your blood pressure should be below 120/80. The first ("top") number is called the systolic pressure. It is a measure of the pressure in your arteries as your heart beats. The second ("bottom") number is called the diastolic pressure. It is a measure of the pressure in your arteries as the heart relaxes. Blood pressure is classified into five stages. The following are the stages for adults who do not have a short-term serious illness or a chronic condition. Systolic pressure and diastolic pressure are measured in a unit called mm Hg (millimeters of mercury).  Normal Systolic pressure: below 120. Diastolic pressure: below 80. Elevated Systolic pressure: 120-129. Diastolic pressure: below 80. Hypertension stage 1 Systolic pressure: 130-139. Diastolic pressure: 80-89. Hypertension stage 2 Systolic pressure: 140 or above. Diastolic pressure: 90 or above. You can have elevated blood pressure  or hypertension even if only the systolic or only the diastolic number in your reading is higher than normal. Follow these instructions at home: Medicines Take over-the-counter and prescription medicines only as told by your health care provider. Tell your health care provider if you are having any side effects from blood pressure medicine. General instructions Check your blood pressure as often as  recommended by your health care provider. Check your blood pressure at the same time every day. Take your monitor to the next appointment with your health care provider to make sure that: You are using it correctly. It provides accurate readings. Understand what your goal blood pressure numbers are. Keep all follow-up visits as told by your health care provider. This is important. General tips Your health care provider can suggest a reliable monitor that will meet your needs. There are several types of home blood pressure monitors. Choose a monitor that has an arm cuff. Do not choose a monitor that measures your blood pressure from your wrist or finger. Choose a cuff that wraps snugly around your upper arm. You should be able to fit only one finger between your arm and the cuff. You can buy a blood pressure monitor at most drugstores or online. Where to find more information American Heart Association: www.heart.org Contact a health care provider if: Your blood pressure is consistently high. Your blood pressure is suddenly low. Get help right away if: Your systolic blood pressure is higher than 180. Your diastolic blood pressure is higher than 120. Summary Blood pressure is a measurement of how strongly your blood is pressing against the walls of your arteries. A blood pressure reading consists of a higher number over a lower number. Ideally, your blood pressure should be below 120/80. Check your blood pressure at the same time every day. Avoid caffeine, alcohol, smoking, and exercise for 30 minutes prior to checking your blood pressure. These agents can affect the accuracy of the blood pressure reading. This information is not intended to replace advice given to you by your health care provider. Make sure you discuss any questions you have with your health care provider. Document Revised: 10/07/2020 Document Reviewed: 11/22/2019 Elsevier Patient Education  2022 [employer].   If you  have lab work done today you will be contacted with your lab results within the next 2 weeks.  If you have not heard from [place] then please contact us. The fastest way to get your results is to register for My Chart.   IF you received an x-ray today, you will receive an invoice from [facility]. Please contact [facility] at [phone] with questions or concerns regarding your invoice.   IF you received labwork today, you will receive an invoice from [facility]. Please contact [facility] at [phone] with questions or concerns regarding your invoice.   Our billing staff will not be able to assist you with questions regarding bills from these companies.  You will be contacted with the lab results as soon as they are available. The fastest way to get your results is to activate your My Chart account. Instructions are located on the last page of this paperwork. If you have not heard from [place] regarding the results in 2 weeks, please contact this office.

## 2021-10-11 NOTE — Telephone Encounter (Signed)
This message is in error

## 2021-10-11 NOTE — Progress Notes (Signed)
Virtual Visit via Video Note  I connected with Donald [name] on 10/11/21 at 11:03 AM by a video enabled telemedicine application and verified that I am speaking with the correct person using two identifiers.  Patient location: at home with spouse, also providing history.  My location: office - [place]   I discussed the limitations, risks, security and privacy concerns of performing an evaluation and management service by telephone and the availability of in person appointments. I also discussed with the patient that there may be a patient responsible charge related to this service. The patient expressed understanding and agreed to proceed, consent obtained  Chief complaint:  Chief Complaint  Patient presents with   Follow-up    Patient states he is following up from last visit in September from having a stroke. Possibly reviewing lab work.     History of Present Illness: Kentley Blyden is a 65 y.o. male  Intraparenchymal hematoma of right brain without LOC Last discussed in September.  Significant residual spastic hemiplegia and cognitive impairment.  PT, OT in place as well as ongoing follow-up with neurology at that time, as well as physical medicine and rehab (Dr. Pearlean Brownie, Dr. Riley Kill). cautioned on signs and symptoms of pressure ulcers and pressure wounds and was planning on working on lift chair at that time. Malnutrition, hyponatremia, hypokalemia discussed.  Was eating well at home at that time without symptoms of aspiration or choking.  Sodium level had returned to normal at 140, potassium 4.3, albumin low normal at 3.5.  Glucose was normal at 94. Appointment with Dr. Pearlean Brownie 09/14/2021 noted.  Recommend he continue strict control of hypertension with BP goal less than 140/90, home PT, OT to be followed later by outpatient therapies.  Plan for MRI of brain with and without contrast to evaluate underlying small vascular structure lesion not seen on initial scans.  47-month follow-up  planned.  MRI scheduled November 7th.  No current antihypertensive.  Home blood pressure readings: 130/high 80's. History of obstructive sleep apnea, no recent use of CPAP. Prior diagnosis of mild OSA - mouth guard had been recommend as mild - did not have as time of stroke.  Still eating better. No pressure wounds seen or skin irritation.   BP Readings from Last 3 Encounters:  10/11/21 (!) 131/91  09/14/21 (!) 133/95  09/08/21 136/89   Anemia of chronic disease Hemoglobin improving at last visit from 11.6-12.3.  Recent testing last week normalized at 13.1.  Flu vaccine recommended. Covid booster: had vaccine, but no boosters - recommended bivalent booster.   Patient Active Problem List   Diagnosis Date Noted   At high risk for inadequate nutritional intake 07/12/2021   Transaminitis 06/06/2021   Intraparenchymal hematoma of brain 06/04/2021   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HCC) 05/30/2021   Right lower lobe pneumonia 05/30/2021   Prediabetes 05/30/2021   Hyperglycemia 05/30/2021   Intracerebral hemorrhage 05/24/2021   Seizure (HCC) 05/23/2021   Malnutrition of moderate degree 05/22/2021   Memory deficits    OSA (obstructive sleep apnea)    Generalized OA    Hypokalemia    Leukocytosis    Annual physical exam 04/03/2017   Past Medical History:  Diagnosis Date   Arthritis    Past Surgical History:  Procedure Laterality Date   APPENDECTOMY     IR ANGIO INTRA EXTRACRAN SEL COM CAROTID INNOMINATE BILAT MOD SED  05/28/2021   IR ANGIO VERTEBRAL SEL VERTEBRAL BILAT MOD SED  05/28/2021   IR IVC FILTER PLMT / S&I Lenise Arena  GUID/MOD SED  05/27/2021   IR US GUIDE VASC ACCESS RIGHT  05/27/2021   Allergies  Allergen Reactions   Sulfa Antibiotics Other (See Comments)    Nausea and eye swelling    Prior to Admission medications   Medication Sig Start Date End Date Taking? Authorizing Provider  Multiple Vitamin (MULTIVITAMIN) tablet Take 1 tablet by mouth daily.   Yes [provider]    Social History   Socioeconomic History   Marital status: Married    Spouse name: Not on file   Number of children: Not on file   Years of education: Not on file   Highest education level: Not on file  Occupational History   Not on file  Tobacco Use   Smoking status: Never   Smokeless tobacco: Never  Vaping Use   Vaping Use: Never used  Substance and Sexual Activity   Alcohol use: No   Drug use: No   Sexual activity: Yes    Birth control/protection: None  Other Topics Concern   Not on file  Social History Narrative   Not on file   Social Determinants of Health   Financial Resource Strain: Not on file  Food Insecurity: Not on file  Transportation Needs: Not on file  Physical Activity: Not on file  Stress: Not on file  Social Connections: Not on file  Intimate Partner Violence: Not on file    Observations/Objective: Vitals:   10/11/21 1051  BP: (!) 131/91  Pulse: 92   Nontoxic appearance on video.  No respiratory distress.  All questions were answered including additional history/questions provided by spouse.  He did note some dry skin on the fingers, some discoloration of the nail but unable to see effectively over video.  Plan for an office visit if symptoms persist.  Hydrating lotion to dry skin on hands.  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  Has appropriate follow-up in place with neurology, recent note reviewed, has resources at home for PT, OT, denies additional needs.  Borderline blood pressure, goals discussed as above.  Plan for follow-up for mouthguard for OSA, but reportedly mild.  Home monitoring discussed, and start low-dose amlodipine if over 140/90.  Anemia has resolved.  68-month follow-up planned.  Dry skin care discussed as  above with RTC precautions if persistent discoloration.  Follow Up Instructions: 3 months.   I discussed the assessment and treatment plan with the patient. The patient was provided an opportunity to ask questions and all were answered. The patient agreed with the plan and demonstrated an understanding of the instructions.   The patient was advised to call back or seek an in-person evaluation if the symptoms worsen or if the condition fails to improve as anticipated.    [name], MD

## 2021-10-11 NOTE — Progress Notes (Signed)
Subjective:    Patient ID: Donald [name], male    DOB: Feb 06, 1956, 65 y.o.   MRN: [id number]  HPI: Donald [name] is a 65 y.o. male who is here is for a Face to Face Tilt in Actor. I Have reviewed the Physical Therapist Wheelchair Evaluation Form and Concur with their assessment assessment and evaluation. The Physical Therapist Recommends Tilt In Orthoptist. The Tilt in Space Manual wheelchair will be a great asset for Donald [name]. This will help with his mobility in his home, also improving his independence with activities of daily living , such as assisting with his bathing and dressing.   He  states his pain is located in his bilateral shoulders, lower back and bilateral lower extremities. He rated  his pain on Health Assessment 0. He continues with outpatient therapy at [facility].   Mrs. Rathert in Room, all questions answered.    Donald [name] weight was performed by physical therapist 145 lbs on 08/12/2021.  Pain Inventory Average Pain 1 Pain Right Now 0 My pain is intermittent, sharp, and aching  LOCATION OF PAIN  left arm and leg  BOWEL Number of stools per week: 14 Oral laxative use No  Type of laxative na Enema or suppository use No  History of colostomy No  Incontinent No   BLADDER Foley In and out cath, frequency . Able to self cath  . Bladder incontinence No  Frequent urination No  Leakage with coughing No  Difficulty starting stream No  Incomplete bladder emptying No    Mobility ability to climb steps?  no do you drive?  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  no  Physicians involved in your care Any changes since last visit?  no   Family History  Problem Relation Age of Onset    Hypertension Mother    Colon polyps Neg Hx    Colon cancer Neg Hx    Esophageal cancer Neg Hx    Rectal cancer Neg Hx    Stomach cancer Neg Hx    Social History   Socioeconomic History   Marital status: Married    Spouse name: Not on file   Number of children: Not on file   Years of education: Not on file   Highest education level: Not on file  Occupational History   Not on file  Tobacco Use   Smoking status: Never   Smokeless tobacco: Never  Vaping Use   Vaping Use: Never used  Substance and Sexual Activity   Alcohol use: No   Drug use: No   Sexual activity: Yes    Birth control/protection: None  Other Topics Concern   Not on file  Social History Narrative   Not on file   Social Determinants of Health   Financial Resource Strain: Not on file  Food Insecurity: Not on file  Transportation Needs: Not on file  Physical Activity: Not on file  Stress: Not on file  Social Connections: Not on file   Past Surgical History:  Procedure Laterality Date   APPENDECTOMY     IR ANGIO INTRA EXTRACRAN SEL COM CAROTID INNOMINATE BILAT MOD SED  05/28/2021   IR ANGIO VERTEBRAL SEL VERTEBRAL BILAT MOD SED  05/28/2021   IR IVC FILTER PLMT / S&I /IMG GUID/MOD SED  05/27/2021   IR US GUIDE VASC ACCESS RIGHT  05/27/2021   Past Medical History:  Diagnosis Date   Arthritis    BP (!) 141/88   Pulse 95   SpO2 98%   Opioid Risk Score:   Fall Risk Score:  `1  Depression screen PHQ 2/9  Depression screen [facility] 2/9 09/08/2021 08/12/2021 11/09/2020 10/26/2020 09/09/2020 04/27/2020 12/16/2019  Decreased Interest 0 1 0 0 0 0 0  Down, Depressed, Hopeless 0 1 0 0 0 0 0  PHQ - 2 Score 0 2 0 0 0 0 0  Altered sleeping 0 0 - - - - -  Tired, decreased energy 0 1 - - - - -  Change in appetite 0 0 - - - - -  Feeling bad or failure about yourself  0 0 - - - - -  Trouble concentrating 0 1 - - - - -  Moving slowly or fidgety/restless 0 0 - - - - -  Suicidal thoughts - 0 - - - - -  PHQ-9 Score 0 4 - - - -  -     Review of Systems     Objective:   Physical Exam Vitals and nursing note reviewed.  Constitutional:      Appearance: Normal appearance.  Cardiovascular:     Rate and Rhythm: Normal rate and regular rhythm.     Pulses: Normal pulses.     Heart sounds: Normal heart sounds.  Pulmonary:     Effort: Pulmonary effort is normal.     Breath sounds: Normal breath sounds.  Genitourinary:    Comments: Condom Cath Intact Musculoskeletal:     Cervical back: Normal range of motion and neck supple.     Comments: Normal Muscle Bulk and Muscle Testing Reveals:  Upper Extremities: Right: Full ROM and Muscle Strength  5/5 Left Upper Extremity: Paralysis  Lower Extremities: Right Decreased ROM and Muscle Strength 4/5 Left lower Extremity: Paralysis Arrived in wheelchair     Neurological:     Mental Status: He is alert.  Psychiatric:        Mood and Affect: Mood normal.        Behavior: Behavior normal.         Assessment & Plan:  1.  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 Continue to Monitor.  Dr Riley Kill Note was Reviewed: Mr. Gage  suffers from left hemiparesis related to a right-sided brain hemorrhage. Due to his left hemiparesis, he is unable to utilize a cane, walker, unable to use standard manual wheelchair, or scooter. The patient is appropriate for  Tilt in Orthoptist . The chair will also allow the patient to perform ADL's more easily.    F/U in 1 month with Dr Riley Kill

## 2021-10-11 NOTE — Telephone Encounter (Signed)
Patient is scheduled at [facility] for 10/18/21 to arrive at 3:30 pm. I spoke with the patient wife and she is aware of the appointment time and day. I also gave her their number of [phone] incase she needed to r/s.  Also, she stated she paid the $100 copay I informed her she would get an reimbursement.

## 2021-10-11 NOTE — Telephone Encounter (Signed)
[facility] sent a message to GI to schedule in January and then will do the auth because it will expire before she has the exam.

## 2021-10-12 ENCOUNTER — Encounter: Payer: Self-pay | Admitting: Registered Nurse

## 2021-10-13 ENCOUNTER — Encounter: Payer: Self-pay | Admitting: Rehabilitation

## 2021-10-13 ENCOUNTER — Other Ambulatory Visit: Payer: Self-pay

## 2021-10-13 ENCOUNTER — Ambulatory Visit: Payer: No Typology Code available for payment source | Admitting: Occupational Therapy

## 2021-10-13 ENCOUNTER — Ambulatory Visit
Payer: No Typology Code available for payment source | Attending: Physical Medicine & Rehabilitation | Admitting: Rehabilitation

## 2021-10-13 ENCOUNTER — Encounter: Payer: Self-pay | Admitting: Speech Pathology

## 2021-10-13 ENCOUNTER — Ambulatory Visit: Payer: No Typology Code available for payment source | Admitting: Speech Pathology

## 2021-10-13 ENCOUNTER — Encounter: Payer: Self-pay | Admitting: Occupational Therapy

## 2021-10-13 DIAGNOSIS — M25622 Stiffness of left elbow, not elsewhere classified: Secondary | ICD-10-CM | POA: Insufficient documentation

## 2021-10-13 DIAGNOSIS — R2681 Unsteadiness on feet: Secondary | ICD-10-CM | POA: Insufficient documentation

## 2021-10-13 DIAGNOSIS — R2689 Other abnormalities of gait and mobility: Secondary | ICD-10-CM | POA: Insufficient documentation

## 2021-10-13 DIAGNOSIS — M6281 Muscle weakness (generalized): Secondary | ICD-10-CM

## 2021-10-13 DIAGNOSIS — R278 Other lack of coordination: Secondary | ICD-10-CM | POA: Insufficient documentation

## 2021-10-13 DIAGNOSIS — R41840 Attention and concentration deficit: Secondary | ICD-10-CM | POA: Diagnosis present

## 2021-10-13 DIAGNOSIS — I69154 Hemiplegia and hemiparesis following nontraumatic intracerebral hemorrhage affecting left non-dominant side: Secondary | ICD-10-CM

## 2021-10-13 DIAGNOSIS — R471 Dysarthria and anarthria: Secondary | ICD-10-CM | POA: Diagnosis present

## 2021-10-13 DIAGNOSIS — R41842 Visuospatial deficit: Secondary | ICD-10-CM

## 2021-10-13 DIAGNOSIS — M25612 Stiffness of left shoulder, not elsewhere classified: Secondary | ICD-10-CM | POA: Insufficient documentation

## 2021-10-13 DIAGNOSIS — R4701 Aphasia: Secondary | ICD-10-CM | POA: Insufficient documentation

## 2021-10-13 DIAGNOSIS — R293 Abnormal posture: Secondary | ICD-10-CM | POA: Insufficient documentation

## 2021-10-13 DIAGNOSIS — M25632 Stiffness of left wrist, not elsewhere classified: Secondary | ICD-10-CM | POA: Diagnosis present

## 2021-10-13 DIAGNOSIS — R41841 Cognitive communication deficit: Secondary | ICD-10-CM

## 2021-10-13 NOTE — Patient Instructions (Signed)
  Loud AH! 5-7x - you may have to model each time  Repeat 10 sentences loud - again, you may have to model and say repeat each time  Sort black and red cards  Sort pennies and quarters  Sort spoons and forks  Have him reach his arm a little to place the cards  10 minutes at a time - if he can't name the color or coin, give him a choice of 2

## 2021-10-13 NOTE — Therapy (Signed)
[facility] [address] [place], [place], [address] Phone: [phone]   Fax:  [phone]  Speech Language Pathology Treatment  Patient Details  Name: Donald [name] MRN: [id number] Date of Birth: 1956/01/19 Referring Provider (SLP): Ranelle Oyster, MD   Encounter Date: 10/13/2021   End of Session - 10/13/21 1448     Visit Number 2    Number of Visits 17    Date for SLP Re-Evaluation 11/12/21    Authorization Type [employer]    SLP Start Time 1015    SLP Stop Time  1100    SLP Time Calculation (min) 45 min    Activity Tolerance Patient tolerated treatment well             Past Medical History:  Diagnosis Date   Arthritis     Past Surgical History:  Procedure Laterality Date   APPENDECTOMY     IR ANGIO INTRA EXTRACRAN SEL COM CAROTID INNOMINATE BILAT MOD SED  05/28/2021   IR ANGIO VERTEBRAL SEL VERTEBRAL BILAT MOD SED  05/28/2021   IR IVC FILTER PLMT / S&I /IMG GUID/MOD SED  05/27/2021   IR US GUIDE VASC ACCESS RIGHT  05/27/2021    There were no vitals filed for this visit.          ADULT SLP TREATMENT - 10/13/21 1435       General Information   Behavior/Cognition Distractible;Requires cueing;Confused;Doesn't follow directions      Treatment Provided   Treatment provided Cognitive-Linquistic      Cognitive-Linquistic Treatment   Treatment focused on Cognition;Dysarthria;Patient/family/caregiver education    Skilled Treatment Baseline dB in 1-2 word respopnses less than 60dB, to only a whisper. Spouse affirms difficulty understanding him. Initiated HEP for hypokinetic dysarthria. Sam required consistent max A for eye contact, head up and repeating loud AH. When he got into practice of repeating loud AH 3x in a row for 2 sets, he averaged 90dB. Again, he required consistent cueing, simple instructions and cues to attend to task and follow simple 1 step command. In repeating short sentences with  volume, with max A to train to repeat (he could not read the sentences due to attention and processing)He achieved dB of 68-72 8/10 sentences. Trained spouse to use consistent modeling and simple 1-2 word commands to A with HEP. Sustained attention targeted in simple color match task f:2 - [place] required max A to open his eyes, attend to cards just left of midline, and use eye contact. He consistently got off task by repeated rubbing his forehead throughout session. Basic naming of colors and shapes (blink) required choice of 2 for pt to correctly name 3/6 colors/shapes      Assessment / Recommendations / Plan   Plan Continue with current plan of care      Progression Toward Goals   Progression toward goals Progressing toward goals              SLP Education - 10/13/21 1442     Education Details HEP for dysarthria    Person(s) Educated Patient;Spouse    Methods Explanation;Demonstration;Verbal cues;Handout    Comprehension Verbalized understanding;Returned demonstration;Verbal cues required;Need further instruction              SLP Short Term Goals - 10/13/21 1447       SLP SHORT TERM GOAL #1   Title Pt will establish and maintain eye contact during auditory instructions given occasional mod A to aid focused attention over 3 sessions  Time 4    Period Weeks    Status On-going    Target Date 10/15/21      SLP SHORT TERM GOAL #2   Title Pt will comprehend and demonstrate 1-step directions with 80% accuracy given no more than 3 repetitions over 3 sessions    Time 4    Period Weeks    Status On-going    Target Date 10/15/21      SLP SHORT TERM GOAL #3   Title Pt will maintain topic to related question for 30+ seconds given occasional mod A over 3 sessions    Time 4    Period Weeks    Status On-going    Target Date 10/15/21      SLP SHORT TERM GOAL #4   Title Pt will use multimodal communication to aid pt's comprehension and attention for 2 functional scenarios     Time 4    Period Weeks    Status On-going    Target Date 10/15/21      SLP SHORT TERM GOAL #5   Title Pt will comprehend simple yes/no questions with 90% accuracy with 2 or less repetitions over 2 sessions    Time 4    Period Weeks    Status On-going    Target Date 10/15/21              SLP Long Term Goals - 10/13/21 1448       SLP LONG TERM GOAL #1   Title Pt will comprehend and demonstrate functional 2-step directions with 80% accuracy given no more than 3 repetitions over 3 sessions    Time 8    Period Weeks    Status On-going      SLP LONG TERM GOAL #2   Title Pt will participate in 1 minute conversation of personally relevant topics with no overt shifts in topic over 3 sessions    Time 8    Period Weeks    Status On-going      SLP LONG TERM GOAL #3   Title Pt will comprehend functional mod complex yes/no questions with 80% accuracy with 2 or less repetitions over 2 sessions    Time 8    Period Weeks    Status On-going      SLP LONG TERM GOAL #4   Title Pt's wife will report increased comprehension and participation in daily activities with use of learned compensations by last ST session    Time 8    Period Weeks    Status On-going              Plan - 10/13/21 1444     Clinical Impression Statement Kristine continues to present with severe cognitive communciation impairments and moderate hypokinetic dysarthria. Initiated training in HEP for dysarthria with max A. Sustained attention, inititation, processing with max A. Pt reqiured short, simple 1-2 word instructions. Ongoing spouse education re: compensations for cognition reqiured. Max A for eye contact, hand off of his head. Continue skilled ST to maximize cognition, comprehension and intelligilbity for safety and to reduce caregiver burden.    Speech Therapy Frequency 2x / week    Duration 8 weeks   6 week trial therapy   Treatment/Interventions Compensatory strategies;Functional tasks;Patient/family  education;Multimodal communcation approach;Compensatory techniques;Internal/external aids;SLP instruction and feedback    Potential to Achieve Goals Fair    Potential Considerations Severity of impairments;Previous level of function;Ability to learn/carryover information;Cooperation/participation level;Medical prognosis  Patient will benefit from skilled therapeutic intervention in order to improve the following deficits and impairments:   Cognitive communication deficit  Dysarthria and anarthria    Problem List Patient Active Problem List   Diagnosis Date Noted   At high risk for inadequate nutritional intake 07/12/2021   Transaminitis 06/06/2021   Intraparenchymal hematoma of brain 06/04/2021   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HCC) 05/30/2021   Right lower lobe pneumonia 05/30/2021   Prediabetes 05/30/2021   Hyperglycemia 05/30/2021   Intracerebral hemorrhage 05/24/2021   Seizure (HCC) 05/23/2021   Malnutrition of moderate degree 05/22/2021   Memory deficits    OSA (obstructive sleep apnea)    Generalized OA    Hypokalemia    Leukocytosis    Annual physical exam 04/03/2017    Taija Mathias, Radene Journey MS, CCC-SLP 10/13/2021, 2:49 PM  [facility] [address] [place], [place], [address] Phone: [phone]   Fax:  [phone]   Name: Donald [name] MRN: [id number] Date of Birth: 23-Apr-1956

## 2021-10-13 NOTE — Therapy (Signed)
[facility] [address] [place], [place], [address] Phone: [phone]   Fax:  [phone]  Physical Therapy Treatment  Patient Details  Name: Donald [name] MRN: [id number] Date of Birth: [birth year] Referring Provider (PT): Alger Simons   Encounter Date: 10/13/2021   PT End of Session - 10/13/21 1354     Visit Number 4    Number of Visits 25    Date for PT Re-Evaluation 12/10/21    Authorization Type [place] [employer]-front office trying to contact insurance company for VL information    Progress Note Due on Visit 10    PT Start Time 1104    PT Stop Time 1145    PT Time Calculation (min) 41 min    Equipment Utilized During Treatment Gait belt    Activity Tolerance Patient tolerated treatment well    Behavior During Therapy Flat affect             Past Medical History:  Diagnosis Date   Arthritis     Past Surgical History:  Procedure Laterality Date   APPENDECTOMY     IR ANGIO INTRA EXTRACRAN SEL COM CAROTID INNOMINATE BILAT MOD SED  05/28/2021   IR ANGIO VERTEBRAL SEL VERTEBRAL BILAT MOD SED  05/28/2021   IR IVC FILTER PLMT / S&I /IMG GUID/MOD SED  05/27/2021   IR US GUIDE VASC ACCESS RIGHT  05/27/2021    There were no vitals filed for this visit.   Subjective Assessment - 10/13/21 1352     Subjective Pt/wife report no changes since last session.    Patient is accompained by: Family member    Pertinent History See problem list for PMH    Patient Stated Goals Wife's goal:  get back him to some form of mobility to walk with cane or walker.    Currently in Pain? No/denies   Did report some L UE pain during mobility                        TA:  Pt assisted to side of mat and performed squat pivot transfer at max A to total A towards the R and +2A for safety to the L.  Pt with very little active participation during today's session due to decreased arousal.  He is  able to answer questions when asked but kept eyes shut for most of session.  While on EOM, he was able to sit at S level with RUE support x 3 mins.  PT then provided active support to stretch pectorals and work on posture from posterior to pt.  Pt with pusher tendencies when attempting to weight shift to the R.  Pt with extreme forward flexed posture, with head rotated to the L.  Pt unable to activate trunk/pelvis to come out of fixed posture during session.  While seated, attempted to have pt reach to arm rest of chair PT seated in ant to pt.  PT provided total A to get pts hand on arm rest and he did hold a few times, but was not able to maintain this forward position.  Assisted into supine position on wedge (due to fixed posture) and eventually he was able to rest head on 2 pillows on wedge.  In hooklying position, PT performing lower trunk rotation to break up tone and improve trunk/pelvic dissociation.  Rehab tech did have to stabilize shoulders to prevent upper body from moving.  Pt with marked tightness when moving  LEs to the R.  Performed rotation x 10 reps with light holds at end of range x 10 secs.  Then performed 2 rolls to the R, again to encourage flexion and rotation to break up tone.  Require total A for rolling with max verbal and facilitation cues to self assist.  Then requires +2A to get into sitting position and squat pivot back to w/c.  Pt assisted to OT session.                PT Education - 10/13/21 1353     Education Details Keeping w/c tilted for improved posture/stretching    Person(s) Educated Spouse;Patient    Methods Explanation    Comprehension Verbalized understanding              PT Short Term Goals - 10/13/21 1354       PT SHORT TERM GOAL #1   Title Pt will perform HEP with family supervision for improved strength, balance, transfers, and posture for improved mobility.  TARGET 10/15/2021    Baseline Pts wife they are performing stretching at home     Time 4    Period Weeks    Status Achieved      PT SHORT TERM GOAL #2   Title Pt will perform squat/stand pivot transfer w/c<>mat with mod assist for decreased caregiver burden.    Baseline max assist 10/13/21    Time 4    Period Weeks    Status Not Met      PT SHORT TERM GOAL #3   Title Pt will sit edge of mat, with RUE support and supervision at least 5 minutes, for improved participation with ADLs.    Baseline Pt was able to sit at EOB with RUE support at S x 3 mins 10/13/21    Time 4    Period Weeks    Status New      PT SHORT TERM GOAL #4   Title Pt will perform standing at sink/parallel bars, x 2 minutes, with mod assist, for improved participation in ADLs.    Baseline Did not perform as not appropriate at this time    Time 4    Period Weeks    Status Not Met               PT Long Term Goals - 09/17/21 1328       PT LONG TERM GOAL #1   Title Pt will perform progression of HEP with family supervision for improved strength, balance, transfers, and posture.  TARGET 12/10/2021    Time 12    Period Weeks    Status New      PT LONG TERM GOAL #2   Title Pt will perform stand pivot transfer w/c<>mat with min assist for improved mobility, decreased caregiver burden.    Time 12    Period Weeks    Status New      PT LONG TERM GOAL #3   Title Pt will perform at least 5 minutes of standing at sink, with min assist, for improved participation in ADLs.    Time 12    Period Weeks    Status New      PT LONG TERM GOAL #4   Title Further gait assessment as strength and standing tolerance progresses, with goal to be written as appropriate.                   Plan - 10/13/21 1356     Clinical Impression  Statement Skilled session focused on assessment of STGs.  Pt with decreased arousal during session, making all mobility difficult.  He continues to require max A for trasnfers (+2A for safety back to w/c).  Requires max verbal and tactile cues for forward reach to  encourage forward trunk flexion.  He is able to sit at [facility] with RUE support, making progress towards this goal.  Did not attempt standing in // bars today as it is not safe at this time.    Personal Factors and Comorbidities Comorbidity 2    Comorbidities Significant PMH: memory deficits, OSA.    Examination-Activity Limitations Locomotion Level;Transfers;Sit;Stand;Dressing;Toileting;Hygiene/Grooming;Bed Mobility;Bathing    Examination-Participation Restrictions Occupation;Community Activity;Church    Stability/Clinical Decision Making Evolving/Moderate complexity    Rehab Potential Fair    PT Frequency 2x / week    PT Duration 12 weeks   plus eval   PT Treatment/Interventions ADLs/Self Care Home Management;Functional mobility training;Therapeutic activities;Therapeutic exercise;Balance training;Patient/family education;Orthotic Fit/Training;Wheelchair mobility training;Manual techniques;Passive range of motion;Neuromuscular re-education    PT Next Visit Plan utilize seated stepper for flexibility, strength; standing at standing frame or at sink/parallel bars (pt has Stedy at home).  Work on LLE neuro-reed, flexibility, weightshifting; posture and sitting balance and weight shift to counter extensor tone in trunk    PT Home Exercise Plan sitting balance WS tasks demoed by PT to CG    Consulted and Agree with Plan of Care Patient;Family member/caregiver             Patient will benefit from skilled therapeutic intervention in order to improve the following deficits and impairments:  Decreased range of motion, Impaired tone, Difficulty walking, Decreased activity tolerance, Decreased balance, Impaired flexibility, Decreased mobility, Decreased strength, Postural dysfunction  Visit Diagnosis: 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)  Abnormal posture  Muscle weakness (generalized)  Other abnormalities of gait and  mobility     Problem List Patient Active Problem List   Diagnosis Date Noted   At high risk for inadequate nutritional intake 07/12/2021   Transaminitis 06/06/2021   Intraparenchymal hematoma of brain 06/04/2021   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[place]) 05/30/2021   Right lower lobe pneumonia 05/30/2021   Prediabetes 05/30/2021   Hyperglycemia 05/30/2021   Intracerebral hemorrhage 05/24/2021   Seizure ([place]) 05/23/2021   Malnutrition of moderate degree 05/22/2021   Memory deficits    OSA (obstructive sleep apnea)    Generalized OA    Hypokalemia    Leukocytosis    Annual physical exam 04/03/2017    Cameron Sprang, PT, MPT [facility] [address] [place], [place], [address] Phone: [phone]   Fax:  [phone] 10/13/21, 2:07 PM   Name: Donald [name] MRN: [id number] Date of Birth: 1956/02/27

## 2021-10-13 NOTE — Therapy (Signed)
[facility] [address] [place], [place], [address] Phone: [phone]   Fax:  [phone]  Occupational Therapy Treatment  Patient Details  Name: Donald [name] MRN: [id number] Date of Birth: 08/28/1956 Referring Provider (OT): Dr. Riley Kill   Encounter Date: 10/13/2021   OT End of Session - 10/13/21 1231     Visit Number 4    Number of Visits 25    Date for OT Re-Evaluation 12/09/21    Authorization Type [employer]    Authorization Time Period goals written for 12 weeks, pt scheduled only for 6 week trial at this time    OT Start Time 1146    OT Stop Time 1230    OT Time Calculation (min) 44 min    Activity Tolerance Patient limited by fatigue;Patient limited by lethargy    Behavior During Therapy Flat affect             Past Medical History:  Diagnosis Date   Arthritis     Past Surgical History:  Procedure Laterality Date   APPENDECTOMY     IR ANGIO INTRA EXTRACRAN SEL COM CAROTID INNOMINATE BILAT MOD SED  05/28/2021   IR ANGIO VERTEBRAL SEL VERTEBRAL BILAT MOD SED  05/28/2021   IR IVC FILTER PLMT / S&I /IMG GUID/MOD SED  05/27/2021   IR US GUIDE VASC ACCESS RIGHT  05/27/2021    There were no vitals filed for this visit.     Transfers total assistance w/c>tilt in space  Seated edge of mat able to maintain sitting balance with unilateral (RUE) support and without support without LOB. Pt challenged with resistance in sitting to al directions with ability to maintain balance. Pt worked on hip and trunk mobility laterally and anterior/posterior. Pt kept eyes closed throughout session but was engaging in conversation. Worked on BUE reaching with therapist facilitaing reach and with facilitating movement of LUE for increasing range of motion with manual therapy and PROM.                      OT Short Term Goals - 10/06/21 1417       OT SHORT TERM GOAL #1   Title Pt's family with be I with  inital HEP.    Time 6    Period Weeks    Status New    Target Date 10/28/21      OT SHORT TERM GOAL #2   Title Pt's family with be I with splint wear, care and precautions and LUE positioning to minimize pain and contracture.    Time 6    Period Weeks    Status New      OT SHORT TERM GOAL #3   Title Pt will consistently wash face and chest with no more than min A.    Time 6    Period Weeks    Status New      OT SHORT TERM GOAL #4   Title Pt will attend to left hemibody space at least 10% of the time with no more than mod v.c    Time 6    Period Weeks    Status On-going      OT SHORT TERM GOAL #5   Title Pt will assist with pulling down shirt in front at least 25% of them with min v.c    Time 6    Period Weeks    Status New      OT SHORT TERM GOAL #6   Title  Pt will follow a simple  1 step command 75% of the time during therapy.    Time 6    Period Weeks    Status On-going               OT Long Term Goals - 09/16/21 1513       OT LONG TERM GOAL #1   Title Pt will donn shirt with mod A, and mod v.c    Time 12    Period Weeks    Status New      OT LONG TERM GOAL #2   Title Pt will attend to L hemibody space during ADLs and functional activities at least 25% of the time with min v.c    Time 12    Period Weeks    Status New      OT LONG TERM GOAL #3   Title Pt will perfom UB bathing with min A    Time 12    Period Weeks    Status New      OT LONG TERM GOAL #4   Title Pt will attend to a simple ADL or functional task for at least 15 mins with no more than min v.c    Time 12    Period Weeks    Status New      OT LONG TERM GOAL #5   Title Pt will tolerate P/ROM shoulder flexion to 90* with pain no greater than 2/10 in prep for dressing.    Time 12    Period Weeks    Status New      Long Term Additional Goals   Additional Long Term Goals Yes      OT LONG TERM GOAL #6   Title Pt will perform toilet transfers with mod A.    Time 12    Period  Weeks    Status New                   Plan - 10/13/21 1253     Clinical Impression Statement Pt remained with eyes close for duration of session but was talking to therapist. Pt coninues to benefit fro positioning and working on LUE NMR and ROM. Custom resting hand splint would be beneficial for prolonged stretch for finger flexion in LUE    OT Occupational Profile and History Detailed Assessment- Review of Records and additional review of physical, cognitive, psychosocial history related to current functional performance    Occupational performance deficits (Please refer to evaluation for details): ADL's;IADL's;Rest and Sleep;Play;Leisure;Social Participation    Body Structure / Function / Physical Skills ADL;UE functional use;Flexibility;Pain;Vision;FMC;Proprioception;ROM;Gait;Coordination;GMC;Sensation;Decreased knowledge of precautions;Decreased knowledge of use of DME;Dexterity;Mobility;Tone;Strength;IADL    Cognitive Skills Attention;Energy/Drive;Memory;Orientation;Perception;Problem Solve;Safety Awareness;Sequencing;Thought;Understand    Rehab Potential Good    Clinical Decision Making Several treatment options, min-mod task modification necessary    Comorbidities Affecting Occupational Performance: May have comorbidities impacting occupational performance    Modification or Assistance to Complete Evaluation  Min-Moderate modification of tasks or assist with assess necessary to complete eval    OT Frequency 2x / week   plus eval   OT Duration 12 weeks    OT Treatment/Interventions Self-care/ADL training;Ultrasound;Visual/perceptual remediation/compensation;Patient/family education;Balance training;Passive range of motion;Gait Training;Paraffin;Cryotherapy;Fluidtherapy;Splinting;Building services engineer;Contrast Bath;Moist Heat;Therapeutic exercise;Manual Therapy;Therapeutic activities;Cognitive remediation/compensation;Neuromuscular education    Plan simple functional  activities to increase L side awreness, education regarding positioning to minimize pain and contracture, custom resting hand splint for LUE    Consulted and Agree with Plan of Care Patient;Family member/caregiver  Patient will benefit from skilled therapeutic intervention in order to improve the following deficits and impairments:   Body Structure / Function / Physical Skills: ADL, UE functional use, Flexibility, Pain, Vision, FMC, Proprioception, ROM, Gait, Coordination, GMC, Sensation, Decreased knowledge of precautions, Decreased knowledge of use of DME, Dexterity, Mobility, Tone, Strength, IADL Cognitive Skills: Attention, Energy/Drive, Memory, Orientation, Perception, Problem Solve, Safety Awareness, Sequencing, Thought, Understand     Visit Diagnosis: Other lack of coordination  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)  Stiffness of left shoulder, not elsewhere classified  Visuospatial deficit  Unsteadiness on feet  Stiffness of left elbow, not elsewhere classified  Stiffness of left wrist, not elsewhere classified  Muscle weakness (generalized)  Attention and concentration deficit    Problem List Patient Active Problem List   Diagnosis Date Noted   At high risk for inadequate nutritional intake 07/12/2021   Transaminitis 06/06/2021   Intraparenchymal hematoma of brain 06/04/2021   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HCC) 05/30/2021   Right lower lobe pneumonia 05/30/2021   Prediabetes 05/30/2021   Hyperglycemia 05/30/2021   Intracerebral hemorrhage 05/24/2021   Seizure (HCC) 05/23/2021   Malnutrition of moderate degree 05/22/2021   Memory deficits    OSA (obstructive sleep apnea)    Generalized OA    Hypokalemia    Leukocytosis    Annual physical exam 04/03/2017    Junious Dresser, OT/L 10/13/2021, 12:55 PM  [facility] [address] [place], [place], [address] Phone: [phone]   Fax:  [phone]  Name: Ananda Sitzer MRN: [id number] Date of Birth: Sep 01, 1956

## 2021-10-18 ENCOUNTER — Ambulatory Visit (HOSPITAL_COMMUNITY)
Admission: RE | Admit: 2021-10-18 | Discharge: 2021-10-18 | Disposition: A | Discharge status: Home / Self Care | Payer: No Typology Code available for payment source | Source: Ambulatory Visit | Attending: Neurology | Admitting: Neurology

## 2021-10-18 ENCOUNTER — Other Ambulatory Visit: Payer: Self-pay

## 2021-10-18 DIAGNOSIS — I611 Nontraumatic intracerebral hemorrhage in hemisphere, cortical: Secondary | ICD-10-CM | POA: Insufficient documentation

## 2021-10-18 MED ORDER — GADOBUTROL 1 MMOL/ML IV SOLN
6.5000 mL | Freq: Once | INTRAVENOUS | Status: AC | PRN
  Administered 2021-10-18: 6.5 mL via INTRAVENOUS

## 2021-10-19 ENCOUNTER — Ambulatory Visit: Payer: No Typology Code available for payment source

## 2021-10-19 ENCOUNTER — Encounter: Payer: Self-pay | Admitting: Rehabilitation

## 2021-10-19 ENCOUNTER — Ambulatory Visit: Payer: No Typology Code available for payment source | Admitting: Rehabilitation

## 2021-10-19 ENCOUNTER — Ambulatory Visit: Payer: No Typology Code available for payment source | Admitting: Occupational Therapy

## 2021-10-19 DIAGNOSIS — R41841 Cognitive communication deficit: Secondary | ICD-10-CM

## 2021-10-19 DIAGNOSIS — M6281 Muscle weakness (generalized): Secondary | ICD-10-CM

## 2021-10-19 DIAGNOSIS — M25632 Stiffness of left wrist, not elsewhere classified: Secondary | ICD-10-CM

## 2021-10-19 DIAGNOSIS — R471 Dysarthria and anarthria: Secondary | ICD-10-CM

## 2021-10-19 DIAGNOSIS — I69154 Hemiplegia and hemiparesis following nontraumatic intracerebral hemorrhage affecting left non-dominant side: Secondary | ICD-10-CM | POA: Diagnosis not present

## 2021-10-19 DIAGNOSIS — R2681 Unsteadiness on feet: Secondary | ICD-10-CM

## 2021-10-19 DIAGNOSIS — M25612 Stiffness of left shoulder, not elsewhere classified: Secondary | ICD-10-CM

## 2021-10-19 DIAGNOSIS — M25622 Stiffness of left elbow, not elsewhere classified: Secondary | ICD-10-CM

## 2021-10-19 DIAGNOSIS — R4701 Aphasia: Secondary | ICD-10-CM

## 2021-10-19 NOTE — Patient Instructions (Addendum)
Try "hey!" X5 in a loud strong voice  Practice saying these phrases at home twice a day: I have to go to the bathroom. I need a blanket.  I want something to eat.  I want some water.  Let's watch tv.  I like to watch "I love Valentina Gu." I need a tissue.   Practice saying your children's names aloud.

## 2021-10-19 NOTE — Therapy (Addendum)
[facility] [address] [place], [place], [address] Phone: [phone]   Fax:  [phone]  Physical Therapy Treatment  Patient Details  Name: Donald [name] MRN: [id number] Date of Birth: 09/07/1956 Referring Provider (PT): Alger Simons   Encounter Date: 10/19/2021   PT End of Session - 10/19/21 1451     Visit Number 5    Number of Visits 25    Date for PT Re-Evaluation 12/10/21    Authorization Type [place] [employer]-front office trying to contact insurance company for VL information    Progress Note Due on Visit 10    PT Start Time 1400    PT Stop Time 1445    PT Time Calculation (min) 45 min    Equipment Utilized During Treatment Gait belt    Activity Tolerance Patient tolerated treatment well    Behavior During Therapy Flat affect             Past Medical History:  Diagnosis Date   Arthritis     Past Surgical History:  Procedure Laterality Date   APPENDECTOMY     IR ANGIO INTRA EXTRACRAN SEL COM CAROTID INNOMINATE BILAT MOD SED  05/28/2021   IR ANGIO VERTEBRAL SEL VERTEBRAL BILAT MOD SED  05/28/2021   IR IVC FILTER PLMT / S&I /IMG GUID/MOD SED  05/27/2021   IR US GUIDE VASC ACCESS RIGHT  05/27/2021    There were no vitals filed for this visit.   Subjective Assessment - 10/19/21 1450     Subjective Pt more alert today, waving to therapist on the way in, reporting no pain.    Patient is accompained by: Family member    Pertinent History See problem list for PMH    Patient Stated Goals Wife's goal:  get back him to some form of mobility to walk with cane or walker.    Currently in Pain? No/denies                   Pt more alert at beginning of session today waving at PT in lobby and "flirting" with therapist assistant student.  Performed squat pivot transfer to mat today at max A with pt at least initiating forward lean today and reaching for arm rest of chair that  PT was sitting in to further encourage forward trunk lean.  Once on EOM, he is still able to sit with RUE supported at S level x 4-5 mins.  While in seated position, PT assisting from posteriorly to stretch ant chest into more upright posture as well as working to perform trunk rotation to reduce rigidity reaching with R UE across body x 10 reps.  Also posteriorly PT working to move pt side/side adding rotation as able to reduce tone and rigidity.  Utilized stedy during session to work on postural stability and activation in LEs with mini sit<>stands.  Cues for hand placement throughout transitions.  Performed x 8 reps with facilitation at pelvis for forward trunk lean and improved hip extension.  Note that he does better today activating hip extension to attempt standing as well as ability to begin to lift trunk with max cues.  Provided pt with visual tasks to perform in order to encourage more upright posture both in supported stand and in standing.  Also had pt reach RUE for PTA students shoulder to encourage upright posture.  Assisted pt back to mat and then squat pivot back to w/c to the L with +2A for safety.  Pt more fatigued and less alert at end of session, therefore transfer was less active back to w/c.                         PT Short Term Goals - 10/19/21 1453       PT SHORT TERM GOAL #1   Title Pt will perform HEP with family supervision for improved strength, balance, transfers, and posture for improved mobility.  TARGET 10/15/2021    Baseline Pts wife they are performing stretching at home    Time 4    Period Weeks    Status Achieved      PT SHORT TERM GOAL #2   Title Pt will perform squat/stand pivot transfer w/c<>mat with mod assist for decreased caregiver burden.    Baseline max assist 10/13/21    Time 4    Period Weeks    Status Not Met      PT SHORT TERM GOAL #3   Title Pt will sit edge of mat, with RUE support and supervision at least 5 minutes, for improved  participation with ADLs.    Baseline Pt was able to sit at EOB with RUE support at S x 3 mins 10/13/21    Time 4    Period Weeks    Status Partially Met      PT SHORT TERM GOAL #4   Title Pt will perform standing at sink/parallel bars, x 2 minutes, with mod assist, for improved participation in ADLs.    Baseline Did not perform as not appropriate at this time    Time 4    Period Weeks    Status Not Met               PT Long Term Goals - 09/17/21 1328       PT LONG TERM GOAL #1   Title Pt will perform progression of HEP with family supervision for improved strength, balance, transfers, and posture.  TARGET 12/10/2021    Time 12    Period Weeks    Status New      PT LONG TERM GOAL #2   Title Pt will perform stand pivot transfer w/c<>mat with min assist for improved mobility, decreased caregiver burden.    Time 12    Period Weeks    Status New      PT LONG TERM GOAL #3   Title Pt will perform at least 5 minutes of standing at sink, with min assist, for improved participation in ADLs.    Time 12    Period Weeks    Status New      PT LONG TERM GOAL #4   Title Further gait assessment as strength and standing tolerance progresses, with goal to be written as appropriate.               PT Short Term Goals - 10/20/21 0750       PT SHORT TERM GOAL #1   Title Pt will perform HEP with family supervision for improved strength, balance, transfers, and posture for improved mobility.  TARGET 11/10/2021    Baseline Pts wife they are performing stretching at home    Time 4    Period Weeks    Status On-going      PT SHORT TERM GOAL #2   Title Pt will perform squat/stand pivot transfer w/c<>mat with mod assist for decreased caregiver burden.    Baseline max assist 10/13/21    Time 4  Period Weeks    Status On-going      PT SHORT TERM GOAL #3   Title Pt will sit edge of mat, with intermittent RUE support and supervision at least 5 minutes, for improved participation  with ADLs.    Baseline Pt was able to sit at EOB with RUE support at S x 3 mins 10/13/21    Time 4    Period Weeks    Status Revised      PT SHORT TERM GOAL #4   Title Pt will perform standing at sink/parallel bars, x 2 minutes, with mod assist, for improved participation in ADLs.    Baseline Did not perform as not appropriate at this time    Time 4    Period Weeks    Status On-going                  Plan - 10/19/21 1451     Clinical Impression Statement Pt more alert at beginning of session today and was able to demonstrate slightly more active movement than last session.  He was better able to initiate forward trunk lean for transfer and did actively participate in standing with use of stedy today.  He also was able to slightly activate trunk for more upright posture when standing/semi standing in stedy.    Personal Factors and Comorbidities Comorbidity 2    Comorbidities Significant PMH: memory deficits, OSA.    Examination-Activity Limitations Locomotion Level;Transfers;Sit;Stand;Dressing;Toileting;Hygiene/Grooming;Bed Mobility;Bathing    Examination-Participation Restrictions Occupation;Community Activity;Church    Stability/Clinical Decision Making Evolving/Moderate complexity    Rehab Potential Fair    PT Frequency 2x / week    PT Duration 12 weeks   plus eval   PT Treatment/Interventions ADLs/Self Care Home Management;Functional mobility training;Therapeutic activities;Therapeutic exercise;Balance training;Patient/family education;Orthotic Fit/Training;Wheelchair mobility training;Manual techniques;Passive range of motion;Neuromuscular re-education    PT Next Visit Plan utilize seated stepper for flexibility, strength; standing at standing frame or at sink/parallel bars (pt has Stedy at home).  Work on LLE neuro-reed, flexibility, weightshifting; posture and sitting balance and weight shift to counter extensor tone in trunk    PT Home Exercise Plan sitting balance WS tasks  demoed by PT to CG    Consulted and Agree with Plan of Care Patient;Family member/caregiver             Patient will benefit from skilled therapeutic intervention in order to improve the following deficits and impairments:  Decreased range of motion, Impaired tone, Difficulty walking, Decreased activity tolerance, Decreased balance, Impaired flexibility, Decreased mobility, Decreased strength, Postural dysfunction  Visit Diagnosis: 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)  Unsteadiness on feet  Muscle weakness (generalized)     Problem List Patient Active Problem List   Diagnosis Date Noted   At high risk for inadequate nutritional intake 07/12/2021   Transaminitis 06/06/2021   Intraparenchymal hematoma of brain 06/04/2021   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[place]) 05/30/2021   Right lower lobe pneumonia 05/30/2021   Prediabetes 05/30/2021   Hyperglycemia 05/30/2021   Intracerebral hemorrhage 05/24/2021   Seizure ([place]) 05/23/2021   Malnutrition of moderate degree 05/22/2021   Memory deficits    OSA (obstructive sleep apnea)    Generalized OA    Hypokalemia    Leukocytosis    Annual physical exam 04/03/2017   Cameron Sprang, PT, MPT [facility] [address] [place], [place], [address] Phone: [phone]   Fax:  [phone] 10/19/21, 3:05 PM   Name:  Aryn Kops MRN: [id number] Date of Birth: 02-Dec-1956

## 2021-10-20 NOTE — Therapy (Signed)
[facility] [address] [place], [place], [address] Phone: [phone]   Fax:  [phone]  Speech Language Pathology Treatment  Patient Details  Name: Donald [name] MRN: [id number] Date of Birth: Jul 21, 1956 Referring Provider (SLP): Ranelle Oyster, MD   Encounter Date: 10/19/2021   End of Session - 10/19/21 1524     Visit Number 3    Number of Visits 17    Date for SLP Re-Evaluation 11/12/21    Authorization Type U[employer]    SLP Start Time 1530    SLP Stop Time  1615    SLP Time Calculation (min) 45 min    Activity Tolerance Patient limited by lethargy;Patient limited by fatigue             Past Medical History:  Diagnosis Date   Arthritis     Past Surgical History:  Procedure Laterality Date   APPENDECTOMY     IR ANGIO INTRA EXTRACRAN SEL COM CAROTID INNOMINATE BILAT MOD SED  05/28/2021   IR ANGIO VERTEBRAL SEL VERTEBRAL BILAT MOD SED  05/28/2021   IR IVC FILTER PLMT / S&I /IMG GUID/MOD SED  05/27/2021   IR US GUIDE VASC ACCESS RIGHT  05/27/2021    There were no vitals filed for this visit.   Subjective Assessment - 10/20/21 0748     Subjective "he is following direction better"    Patient is accompained by: Family member   wife   Currently in Pain? No/denies                   ADULT SLP TREATMENT - 10/19/21 1524       General Information   Behavior/Cognition Distractible;Requires cueing;Confused;Doesn't follow directions      Treatment Provided   Treatment provided Cognitive-Linquistic      Cognitive-Linquistic Treatment   Treatment focused on Cognition;Aphasia;Dysarthria    Skilled Treatment Pt entered with improved eye contact and overall increased engagement since last visit with this SLP. Pt's wife reported they practiced sentences at home but he would not complete the loud /a/. SLP trialed /a/ today with max verbal and visual cues provided, but pt unable to comprehend and  coordinate task. SLP modified task to loud "hey!" Usual modeling, simplification of instruction, and max verbal cues required to comprehend task. Pt completed trials x3 in low-mid 90s. Left neglect reported for reading independently, in which pt able to read from midline to right. SLP trialed use of pictures to aid naming and visual scanning, with consistent mod A required to locate wife's phone screen to his left. Pt able to name 2/4 of his children with consistent mod verbal and visual cues. SLP suggested working on personally relevant tasks, such as naming family members and functional phrases, as part of HEP. As session progressed, usual cues required to establish eye contact and maintain throughout. Usual confabulation noted in conversation, as pt was endorsing he had been speaking to SLP over phone. Re-direction was rarely effective.      Assessment / Recommendations / Plan   Plan Continue with current plan of care      Progression Toward Goals   Progression toward goals Progressing toward goals              SLP Education - 10/20/21 0754     Education Details HEP    Person(s) Educated Patient;Spouse    Methods Explanation;Demonstration;Verbal cues;Tactile cues;Handout    Comprehension Verbalized understanding;Returned demonstration;Verbal cues required;Need further instruction  SLP Short Term Goals - 10/19/21 1525       SLP SHORT TERM GOAL #1   Title Pt will establish and maintain eye contact during auditory instructions given occasional mod A to aid focused attention over 3 sessions    Time 4    Period Weeks    Status On-going    Target Date 10/15/21      SLP SHORT TERM GOAL #2   Title Pt will comprehend and demonstrate 1-step directions with 80% accuracy given no more than 3 repetitions over 3 sessions    Time 4    Period Weeks    Status On-going    Target Date 10/15/21      SLP SHORT TERM GOAL #3   Title Pt will maintain topic to related question for  30+ seconds given occasional mod A over 3 sessions    Time 4    Period Weeks    Status On-going    Target Date 10/15/21      SLP SHORT TERM GOAL #4   Title Pt will use multimodal communication to aid pt's comprehension and attention for 2 functional scenarios    Time 4    Period Weeks    Status On-going    Target Date 10/15/21      SLP SHORT TERM GOAL #5   Title Pt will comprehend simple yes/no questions with 90% accuracy with 2 or less repetitions over 2 sessions    Time 4    Period Weeks    Status On-going    Target Date 10/15/21              SLP Long Term Goals - 10/19/21 1525       SLP LONG TERM GOAL #1   Title Pt will comprehend and demonstrate functional 2-step directions with 80% accuracy given no more than 3 repetitions over 3 sessions    Time 8    Period Weeks    Status On-going    Target Date 11/12/21      SLP LONG TERM GOAL #2   Title Pt will participate in 1 minute conversation of personally relevant topics with no overt shifts in topic over 3 sessions    Time 8    Period Weeks    Status On-going    Target Date 11/12/21      SLP LONG TERM GOAL #3   Title Pt will comprehend functional mod complex yes/no questions with 80% accuracy with 2 or less repetitions over 2 sessions    Time 8    Period Weeks    Status On-going    Target Date 11/12/21      SLP LONG TERM GOAL #4   Title Pt's wife will report increased comprehension and participation in daily activities with use of learned compensations by last ST session    Time 8    Period Weeks    Status On-going    Target Date 11/12/21              Plan - 10/19/21 1525     Clinical Impression Statement Loukas continues to present with severe cognitive communciation impairments, possible anomia, and moderate hypokinetic dysarthria. Reviewed recommendations for dysarthria HEP requiring max A. Sustained attention, inititation, processing with max A. Pt required short, simple 1-2 word instructions.  Ongoing spouse education re: compensations for cognition reqiured. Rare increasing to max A for eye contact, hand off of his head. Continue skilled ST to maximize cognition, comprehension and intelligilbity for safety and to  reduce caregiver burden.    Speech Therapy Frequency 2x / week    Duration 8 weeks   6 week trial therapy   Treatment/Interventions Compensatory strategies;Functional tasks;Patient/family education;Multimodal communcation approach;Compensatory techniques;Internal/external aids;SLP instruction and feedback    Potential to Achieve Goals Fair    Potential Considerations Severity of impairments;Previous level of function;Ability to learn/carryover information;Cooperation/participation level;Medical prognosis    Consulted and Agree with Plan of Care Patient;Family member/caregiver             Patient will benefit from skilled therapeutic intervention in order to improve the following deficits and impairments:   Cognitive communication deficit  Aphasia  Dysarthria and anarthria    Problem List Patient Active Problem List   Diagnosis Date Noted   At high risk for inadequate nutritional intake 07/12/2021   Transaminitis 06/06/2021   Intraparenchymal hematoma of brain 06/04/2021   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HCC) 05/30/2021   Right lower lobe pneumonia 05/30/2021   Prediabetes 05/30/2021   Hyperglycemia 05/30/2021   Intracerebral hemorrhage 05/24/2021   Seizure (HCC) 05/23/2021   Malnutrition of moderate degree 05/22/2021   Memory deficits    OSA (obstructive sleep apnea)    Generalized OA    Hypokalemia    Leukocytosis    Annual physical exam 04/03/2017    Janann Colonel, MA CCC-SLP 10/20/2021, 7:58 AM  [facility] [address] [place], [place], [address] Phone: [phone]   Fax:  [phone]   Name: Donald [name] MRN: [id number] Date of Birth: Jan 18, 1956

## 2021-10-20 NOTE — Therapy (Signed)
[facility] [address] [place], [place], [address] Phone: [phone]   Fax:  [phone]  Occupational Therapy Treatment  Patient Details  Name: Donald [name] MRN: [id number] Date of Birth: 1956/04/13 Referring Provider (OT): Dr. Riley Kill   Encounter Date: 10/19/2021   OT End of Session - 10/20/21 0752     Visit Number 5    Number of Visits 25    Date for OT Re-Evaluation 12/09/21    Authorization Type UHC    Authorization Time Period goals written for 12 weeks, pt scheduled only for 6 week trial at this time    OT Start Time 1450    OT Stop Time 1530    OT Time Calculation (min) 40 min    Activity Tolerance Patient limited by fatigue;Patient limited by lethargy    Behavior During Therapy Flat affect             Past Medical History:  Diagnosis Date   Arthritis     Past Surgical History:  Procedure Laterality Date   APPENDECTOMY     IR ANGIO INTRA EXTRACRAN SEL COM CAROTID INNOMINATE BILAT MOD SED  05/28/2021   IR ANGIO VERTEBRAL SEL VERTEBRAL BILAT MOD SED  05/28/2021   IR IVC FILTER PLMT / S&I /IMG GUID/MOD SED  05/27/2021   IR US GUIDE VASC ACCESS RIGHT  05/27/2021    There were no vitals filed for this visit.   Subjective Assessment - 10/20/21 1414     Subjective  Pt indicates left hand pain with movement yet he is unable to rate.    Patient is accompanied by: Family member    Pertinent History 65 y.o. male hospitlized with  ICH R frontal lobe and small adjacent  SAH, pt transferred to rehab then he exeperienced worsening midline shift of R MCA, R ACA and DVT in RLE    Limitations hx of seizure, no estim without MD clearance, hx of memory deficit OSA    Patient Stated Goals be more independent    Currently in Pain? Yes    Pain Score --   unable to rate   Pain Location Hand    Pain Orientation Left    Pain Descriptors / Indicators Aching    Pain Type Acute pain    Pain Onset More than a month  ago    Pain Frequency Intermittent    Aggravating Factors  movement    Pain Relieving Factors repositioning                  Treatment: Therapist initiated fabrication and fitting of a resting hand splint for LUE. Splint was not issued due to time constraints. Plan to educate wife and issue next visit.                  OT Short Term Goals - 10/06/21 1417       OT SHORT TERM GOAL #1   Title Pt's family with be I with inital HEP.    Time 6    Period Weeks    Status New    Target Date 10/28/21      OT SHORT TERM GOAL #2   Title Pt's family with be I with splint wear, care and precautions and LUE positioning to minimize pain and contracture.    Time 6    Period Weeks    Status New      OT SHORT TERM GOAL #3   Title Pt will consistently wash face  and chest with no more than min A.    Time 6    Period Weeks    Status New      OT SHORT TERM GOAL #4   Title Pt will attend to left hemibody space at least 10% of the time with no more than mod v.c    Time 6    Period Weeks    Status On-going      OT SHORT TERM GOAL #5   Title Pt will assist with pulling down shirt in front at least 25% of them with min v.c    Time 6    Period Weeks    Status New      OT SHORT TERM GOAL #6   Title Pt will follow a simple  1 step command 75% of the time during therapy.    Time 6    Period Weeks    Status On-going               OT Long Term Goals - 09/16/21 1513       OT LONG TERM GOAL #1   Title Pt will donn shirt with mod A, and mod v.c    Time 12    Period Weeks    Status New      OT LONG TERM GOAL #2   Title Pt will attend to L hemibody space during ADLs and functional activities at least 25% of the time with min v.c    Time 12    Period Weeks    Status New      OT LONG TERM GOAL #3   Title Pt will perfom UB bathing with min A    Time 12    Period Weeks    Status New      OT LONG TERM GOAL #4   Title Pt will attend to a simple ADL or  functional task for at least 15 mins with no more than min v.c    Time 12    Period Weeks    Status New      OT LONG TERM GOAL #5   Title Pt will tolerate P/ROM shoulder flexion to 90* with pain no greater than 2/10 in prep for dressing.    Time 12    Period Weeks    Status New      Long Term Additional Goals   Additional Long Term Goals Yes      OT LONG TERM GOAL #6   Title Pt will perform toilet transfers with mod A.    Time 12    Period Weeks    Status New                   Plan - 10/20/21 0745     Clinical Impression Statement Therapist initiated fabrication and fitting of a resting hand splint, splint was not issued due to time constraints.    OT Occupational Profile and History Detailed Assessment- Review of Records and additional review of physical, cognitive, psychosocial history related to current functional performance    Occupational performance deficits (Please refer to evaluation for details): ADL's;IADL's;Rest and Sleep;Play;Leisure;Social Participation    Body Structure / Function / Physical Skills ADL;UE functional use;Flexibility;Pain;Vision;FMC;Proprioception;ROM;Gait;Coordination;GMC;Sensation;Decreased knowledge of precautions;Decreased knowledge of use of DME;Dexterity;Mobility;Tone;Strength;IADL    Cognitive Skills Attention;Energy/Drive;Memory;Orientation;Perception;Problem Solve;Safety Awareness;Sequencing;Thought;Understand    Rehab Potential Good    Clinical Decision Making Several treatment options, min-mod task modification necessary    Comorbidities Affecting Occupational Performance: May have comorbidities impacting occupational  performance    Modification or Assistance to Complete Evaluation  Min-Moderate modification of tasks or assist with assess necessary to complete eval    OT Frequency 2x / week   plus eval   OT Duration 12 weeks    OT Treatment/Interventions Self-care/ADL training;Ultrasound;Visual/perceptual  remediation/compensation;Patient/family education;Balance training;Passive range of motion;Gait Training;Paraffin;Cryotherapy;Fluidtherapy;Splinting;Building services engineer;Contrast Bath;Moist Heat;Therapeutic exercise;Manual Therapy;Therapeutic activities;Cognitive remediation/compensation;Neuromuscular education    Plan finish resting hand splint for LUE, NMR    Consulted and Agree with Plan of Care Patient;Family member/caregiver             Patient will benefit from skilled therapeutic intervention in order to improve the following deficits and impairments:   Body Structure / Function / Physical Skills: ADL, UE functional use, Flexibility, Pain, Vision, FMC, Proprioception, ROM, Gait, Coordination, GMC, Sensation, Decreased knowledge of precautions, Decreased knowledge of use of DME, Dexterity, Mobility, Tone, Strength, IADL Cognitive Skills: Attention, Energy/Drive, Memory, Orientation, Perception, Problem Solve, Safety Awareness, Sequencing, Thought, Understand     Visit Diagnosis: 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)  Muscle weakness (generalized)  Stiffness of left shoulder, not elsewhere classified  Stiffness of left elbow, not elsewhere classified  Stiffness of left wrist, not elsewhere classified    Problem List Patient Active Problem List   Diagnosis Date Noted   At high risk for inadequate nutritional intake 07/12/2021   Transaminitis 06/06/2021   Intraparenchymal hematoma of brain 06/04/2021   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HCC) 05/30/2021   Right lower lobe pneumonia 05/30/2021   Prediabetes 05/30/2021   Hyperglycemia 05/30/2021   Intracerebral hemorrhage 05/24/2021   Seizure (HCC) 05/23/2021   Malnutrition of moderate degree 05/22/2021   Memory deficits    OSA (obstructive sleep apnea)    Generalized OA    Hypokalemia    Leukocytosis    Annual physical exam 04/03/2017     Tenna Lacko, OT/L 10/20/2021, 2:20 PM  [facility] [address] [place], [place], [address] Phone: [phone]   Fax:  [phone]  Name: Donald [name] MRN: [id number] Date of Birth: [birth year]

## 2021-10-21 ENCOUNTER — Ambulatory Visit: Payer: No Typology Code available for payment source

## 2021-10-21 ENCOUNTER — Telehealth: Payer: Self-pay

## 2021-10-21 ENCOUNTER — Other Ambulatory Visit: Payer: Self-pay

## 2021-10-21 ENCOUNTER — Ambulatory Visit: Payer: No Typology Code available for payment source | Admitting: Occupational Therapy

## 2021-10-21 ENCOUNTER — Encounter: Payer: Self-pay | Admitting: Occupational Therapy

## 2021-10-21 DIAGNOSIS — I69154 Hemiplegia and hemiparesis following nontraumatic intracerebral hemorrhage affecting left non-dominant side: Secondary | ICD-10-CM

## 2021-10-21 DIAGNOSIS — R41841 Cognitive communication deficit: Secondary | ICD-10-CM

## 2021-10-21 DIAGNOSIS — M25632 Stiffness of left wrist, not elsewhere classified: Secondary | ICD-10-CM

## 2021-10-21 DIAGNOSIS — M6281 Muscle weakness (generalized): Secondary | ICD-10-CM

## 2021-10-21 DIAGNOSIS — R278 Other lack of coordination: Secondary | ICD-10-CM

## 2021-10-21 DIAGNOSIS — R2681 Unsteadiness on feet: Secondary | ICD-10-CM

## 2021-10-21 DIAGNOSIS — R4701 Aphasia: Secondary | ICD-10-CM

## 2021-10-21 DIAGNOSIS — R471 Dysarthria and anarthria: Secondary | ICD-10-CM

## 2021-10-21 NOTE — Telephone Encounter (Signed)
Error

## 2021-10-21 NOTE — Patient Instructions (Signed)
Your Splint This splint should initially be fitted by a healthcare practitioner.  The healthcare practitioner is responsible for providing wearing instructions and precautions to the patient, other healthcare practitioners and care provider involved in the patient's care.  This splint was custom made for you. Please read the following instructions to learn about wearing and caring for your splint.  Precautions Should your splint cause any of the following problems, remove the splint immediately and contact your therapist/physician. Swelling Severe Pain Pressure Areas Stiffness Numbness  Do not wear your splint while operating machinery unless it has been fabricated for that purpose.  When To Wear Your Splint Where your splint according to your therapist/physician instructions. Daytime for 1 hours then check skin for pressure areas or redness, if no issues you may progress to wearing 2-3 hours at a time during daytime only   Care and Cleaning of Your Splint Keep your splint away from open flames. Your splint will lose its shape in temperatures over 135 degrees Farenheit, ( in car windows, near radiators, ovens or in hot water).  Never make any adjustments to your splint, if the splint needs adjusting remove it and make an appointment to see your therapist. Your splint, including the cushion liner may be cleaned with soap and lukewarm water.  Do not immerse in hot water over 135 degrees Farenheit. Straps may be washed with soap and water, but do not moisten the self-adhesive portion.

## 2021-10-21 NOTE — Telephone Encounter (Signed)
Called and spoke to pts wife Mrs. Neale Burly (on [facility]) and relayed results per Dr. Epimenio Foot. Answered Mrs. Sigala's questions and was appreciative of call.

## 2021-10-21 NOTE — Progress Notes (Signed)
See telephone note from 10/21/21.

## 2021-10-21 NOTE — Telephone Encounter (Signed)
-----   Message from Asa Lente, MD sent at 10/20/2021  7:08 PM EST ----- Please let him know that the MRI of the brain showed improvement of the hemorrhage that he had back in June.  The swelling in the brain has resolved.

## 2021-10-21 NOTE — Therapy (Signed)
[facility] [address] [place], [place], [address] Phone: [phone]   Fax:  [phone]  Speech Language Pathology Treatment  Patient Details  Name: Donald [name] MRN: [id number] Date of Birth: 27-Mar-1956 Referring Provider (SLP): Meredith Staggers, MD   Encounter Date: 10/21/2021   End of Session - 10/21/21 1413     Visit Number 4    Number of Visits 17    Authorization Type UHC    SLP Start Time 5374    SLP Stop Time  1400    SLP Time Calculation (min) 41 min    Activity Tolerance Patient limited by lethargy;Patient limited by fatigue             Past Medical History:  Diagnosis Date   Arthritis     Past Surgical History:  Procedure Laterality Date   APPENDECTOMY     IR ANGIO INTRA EXTRACRAN SEL COM CAROTID INNOMINATE BILAT MOD SED  05/28/2021   IR ANGIO VERTEBRAL SEL VERTEBRAL BILAT MOD SED  05/28/2021   IR IVC FILTER PLMT / S&I /IMG GUID/MOD SED  05/27/2021   IR US GUIDE VASC ACCESS RIGHT  05/27/2021    There were no vitals filed for this visit.   Subjective Assessment - 10/21/21 1321     Subjective "wonderful"    Patient is accompained by: Family member   wife   Currently in Pain? No/denies                   ADULT SLP TREATMENT - 10/21/21 1319       General Information   Behavior/Cognition Distractible;Requires cueing;Confused;Doesn't follow directions      Treatment Provided   Treatment provided Cognitive-Linquistic      Cognitive-Linquistic Treatment   Treatment focused on Cognition;Aphasia;Dysarthria    Skilled Treatment Pt exhibited more inconsistent eye contact this session, requiring increased verbal and tactile cues to establish eye contact prior to directive. Pt able to name 4/4 children with max verbal and visual cues. Poor topic maintenance exhibited, which SLP trialed writing targeted topic and keeping within eyesight. Inconsistent effectiveness noted. SLP also  recommended use of yes/no questions and alternation of yes/no questions if perseveration exhibited. SLP trialed "hey!" with pt noted to respond with off topic comment or singing. Pt completed one successful trial with max SLP modeling and simple 1-step directions. SLP provided 1-2 step body movement directions as HEP.      Assessment / Recommendations / Plan   Plan Continue with current plan of care      Progression Toward Goals   Progression toward goals Progressing toward goals              SLP Education - 10/21/21 1411     Education Details HEP, communication strateiges (yes/no, establish eye contact, write topic)    Person(s) Educated Patient;Spouse    Methods Explanation;Demonstration;Tactile cues;Verbal cues;Handout    Comprehension Verbalized understanding;Returned demonstration;Verbal cues required;Tactile cues required;Need further instruction              SLP Short Term Goals - 10/21/21 1415       SLP SHORT TERM GOAL #1   Title Pt will establish and maintain eye contact during auditory instructions given occasional mod A to aid focused attention over 3 sessions    Time 4    Period Weeks    Status Not Met   ongoing   Target Date 11/12/21   extended all STGs 4 weeks as pt started tx ~1  month after eval     SLP SHORT TERM GOAL #2   Title Pt will comprehend and demonstrate 1-step directions with 80% accuracy given no more than 3 repetitions over 3 sessions    Time 4    Period Weeks    Status Not Met   ongoing   Target Date 11/12/21      SLP SHORT TERM GOAL #3   Title Pt will maintain topic to related question for 30+ seconds given occasional mod A over 3 sessions    Time 4    Period Weeks    Status Not Met   ongoing   Target Date 11/12/21      SLP SHORT TERM GOAL #4   Title Pt will use multimodal communication to aid pt's comprehension and attention for 2 functional scenarios    Time 4    Period Weeks    Status Not Met   ongoing   Target Date 11/12/21       SLP SHORT TERM GOAL #5   Title Pt will comprehend simple yes/no questions with 90% accuracy with 2 or less repetitions over 2 sessions    Time 4    Period Weeks    Status On-going   ongoing   Target Date 11/12/21              SLP Long Term Goals - 10/21/21 1415       SLP LONG TERM GOAL #1   Title Pt will comprehend and demonstrate functional 2-step directions with 80% accuracy given no more than 3 repetitions over 3 sessions    Time 8    Period Weeks    Status On-going    Target Date 11/12/21      SLP LONG TERM GOAL #2   Title Pt will participate in 1 minute conversation of personally relevant topics with no overt shifts in topic over 3 sessions    Time 8    Period Weeks    Status On-going      SLP LONG TERM GOAL #3   Title Pt will comprehend functional mod complex yes/no questions with 80% accuracy with 2 or less repetitions over 2 sessions    Time 8    Period Weeks    Status On-going    Target Date 11/12/21      SLP LONG TERM GOAL #4   Title Pt's wife will report increased comprehension and participation in daily activities with use of learned compensations by last ST session    Time 8    Period Weeks    Status On-going    Target Date 11/12/21              Plan - 10/21/21 1413     Clinical Impression Statement Mitchelle continues to present with severe cognitive communciation impairments, 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 Reviewed recommendations for dysarthria HEP requiring max A. SLP provided communication strateiges to assist with patient comprehension and topic maintenance. Sustained attention, inititation, processing with max A. Pt required short, simple 1-2 word instructions with repetition. Occasional increasing to max A for eye contact, hand off of his head. Ongoing spouse education re: compensations for cognition and communication reqiured.  Continue skilled ST to maximize cognition, comprehension and intelligilbity for  safety and to reduce caregiver burden.    Speech Therapy Frequency 2x / week    Duration 8 weeks   6 week trial therapy   Treatment/Interventions Compensatory strategies;Functional tasks;Patient/family education;Multimodal communcation approach;Compensatory techniques;Internal/external aids;SLP instruction and  feedback    Potential to Achieve Goals Fair    Potential Considerations Severity of impairments;Previous level of function;Ability to learn/carryover information;Cooperation/participation level;Medical prognosis    Consulted and Agree with Plan of Care Patient;Family member/caregiver             Patient will benefit from skilled therapeutic intervention in order to improve the following deficits and impairments:   Cognitive communication deficit  Aphasia  Dysarthria and anarthria    Problem List Patient Active Problem List   Diagnosis Date Noted   At high risk for inadequate nutritional intake 07/12/2021   Transaminitis 06/06/2021   Intraparenchymal hematoma of brain 06/04/2021   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[place]) 05/30/2021   Right lower lobe pneumonia 05/30/2021   Prediabetes 05/30/2021   Hyperglycemia 05/30/2021   Intracerebral hemorrhage 05/24/2021   Seizure ([place]) 05/23/2021   Malnutrition of moderate degree 05/22/2021   Memory deficits    OSA (obstructive sleep apnea)    Generalized OA    Hypokalemia    Leukocytosis    Annual physical exam 04/03/2017    Alinda Deem, MA CCC-SLP 10/21/2021, 2:18 PM  [facility] [address] [place], [place], [address] Phone: [phone]   Fax:  [phone]   Name: Mousa Prout MRN: [id number] Date of Birth: Dec 11, 1956

## 2021-10-22 NOTE — Therapy (Signed)
[facility] [address] [place], [place], [address] Phone: [phone]   Fax:  [phone]  Physical Therapy Treatment  Patient Details  Name: Donald [name] MRN: [id number] Date of Birth: 05-01-1956 Referring Provider (PT): Faith Rogue   Encounter Date: 10/21/2021   PT End of Session - 10/21/21 1528     Visit Number 6    Number of Visits 25    Date for PT Re-Evaluation 12/10/21    Authorization Type U[employer]-front office trying to contact insurance company for VL information    Progress Note Due on Visit 10    PT Start Time 1445    PT Stop Time 1530    PT Time Calculation (min) 45 min    Equipment Utilized During Treatment Gait belt    Activity Tolerance Patient limited by lethargy;Patient limited by fatigue    Behavior During Therapy Flat affect             Past Medical History:  Diagnosis Date   Arthritis     Past Surgical History:  Procedure Laterality Date   APPENDECTOMY     IR ANGIO INTRA EXTRACRAN SEL COM CAROTID INNOMINATE BILAT MOD SED  05/28/2021   IR ANGIO VERTEBRAL SEL VERTEBRAL BILAT MOD SED  05/28/2021   IR IVC FILTER PLMT / S&I /IMG GUID/MOD SED  05/27/2021   IR US GUIDE VASC ACCESS RIGHT  05/27/2021    There were no vitals filed for this visit.   Subjective Assessment - 10/21/21 1509     Subjective Paient engaging PT today, eyes wide open and answering/asking questions.    Patient is accompained by: Family member    Pertinent History See problem list for PMH    Patient Stated Goals Wife's goal:  get back him to some form of mobility to walk with cane or walker.    Pain Onset More than a month ago                               [facility] Adult PT Treatment/Exercise - 10/22/21 0001       Transfers   Transfers Sit to Stand;Stand to Sit    Sit to Stand 2: Max assist;With upper extremity assist;From bed;From chair/3-in-1    Sit to  Stand Details Manual facilitation for weight shifting;Manual facilitation for placement;Verbal cues for sequencing;Verbal cues for technique    Stand to Sit 2: Max assist;With upper extremity assist;To bed;To chair/3-in-1    Stand to Sit Details (indicate cue type and reason) Verbal cues for sequencing;Verbal cues for technique;Manual facilitation for weight shifting;Manual facilitation for placement    Squat Pivot Transfers 2: Max assist    Squat Pivot Transfer Details (indicate cue type and reason) limited BLE engagement from patient            Todays session focused on seated balance training, BLE PROM and tone reducing techniques, transfer training.  Tasks included reaching twisting at trunk using RUE to place and retreive objects incorporating rotational, cross body and PNF directions.  Squat pivot transfers require MaxA with minimal assist from patient through BLEs.             PT Short Term Goals - 10/20/21 0750       PT SHORT TERM GOAL #1   Title Pt will perform HEP with family supervision for improved strength, balance, transfers, and posture for improved mobility.  TARGET 11/10/2021  Baseline Pts wife they are performing stretching at home    Time 4    Period Weeks    Status On-going      PT SHORT TERM GOAL #2   Title Pt will perform squat/stand pivot transfer w/c<>mat with mod assist for decreased caregiver burden.    Baseline max assist 10/13/21    Time 4    Period Weeks    Status On-going      PT SHORT TERM GOAL #3   Title Pt will sit edge of mat, with intermittent RUE support and supervision at least 5 minutes, for improved participation with ADLs.    Baseline Pt was able to sit at EOB with RUE support at S x 3 mins 10/13/21    Time 4    Period Weeks    Status Revised      PT SHORT TERM GOAL #4   Title Pt will perform standing at sink/parallel bars, x 2 minutes, with mod assist, for improved participation in ADLs.    Baseline Did not perform as not  appropriate at this time    Time 4    Period Weeks    Status On-going               PT Long Term Goals - 09/17/21 1328       PT LONG TERM GOAL #1   Title Pt will perform progression of HEP with family supervision for improved strength, balance, transfers, and posture.  TARGET 12/10/2021    Time 12    Period Weeks    Status New      PT LONG TERM GOAL #2   Title Pt will perform stand pivot transfer w/c<>mat with min assist for improved mobility, decreased caregiver burden.    Time 12    Period Weeks    Status New      PT LONG TERM GOAL #3   Title Pt will perform at least 5 minutes of standing at sink, with min assist, for improved participation in ADLs.    Time 12    Period Weeks    Status New      PT LONG TERM GOAL #4   Title Further gait assessment as strength and standing tolerance progresses, with goal to be written as appropriate.                   Plan - 10/22/21 1153     Clinical Impression Statement Patient remained alert throughout session today, asking and answering questions and engaging PT verbaly ad visually.  Treatment performed in sitting utilizing mat table.  Tasks centerd around unsupported sitting balance as well as trunk mobility tasks including reaching, twisting and forward bending.  Continues to require max assist with stand pivot transfers.    Personal Factors and Comorbidities Comorbidity 2    Comorbidities Significant PMH: memory deficits, OSA.    Examination-Activity Limitations Locomotion Level;Transfers;Sit;Stand;Dressing;Toileting;Hygiene/Grooming;Bed Mobility;Bathing    Examination-Participation Restrictions Occupation;Community Activity;Church    Stability/Clinical Decision Making Evolving/Moderate complexity    Rehab Potential Fair    PT Frequency 2x / week    PT Duration 12 weeks   plus eval   PT Treatment/Interventions ADLs/Self Care Home Management;Functional mobility training;Therapeutic activities;Therapeutic  exercise;Balance training;Patient/family education;Orthotic Fit/Training;Wheelchair mobility training;Manual techniques;Passive range of motion;Neuromuscular re-education    PT Next Visit Plan utilize seated stepper for flexibility, strength; standing at standing frame or at sink/parallel bars (pt has Stedy at home).  Work on LLE neuro-reed, flexibility, weightshifting; posture and sitting balance and  weight shift to counter extensor tone in trunk, ROM and tone reducing techniques    PT Home Exercise Plan sitting balance WS tasks demoed by PT to CG    Consulted and Agree with Plan of Care Patient;Family member/caregiver             Patient will benefit from skilled therapeutic intervention in order to improve the following deficits and impairments:  Decreased range of motion, Impaired tone, Difficulty walking, Decreased activity tolerance, Decreased balance, Impaired flexibility, Decreased mobility, Decreased strength, Postural dysfunction  Visit Diagnosis: 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)  Muscle weakness (generalized)  Unsteadiness on feet     Problem List Patient Active Problem List   Diagnosis Date Noted   At high risk for inadequate nutritional intake 07/12/2021   Transaminitis 06/06/2021   Intraparenchymal hematoma of brain 06/04/2021   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[facility]) 05/30/2021   Right lower lobe pneumonia 05/30/2021   Prediabetes 05/30/2021   Hyperglycemia 05/30/2021   Intracerebral hemorrhage 05/24/2021   Seizure ([facility]) 05/23/2021   Malnutrition of moderate degree 05/22/2021   Memory deficits    OSA (obstructive sleep apnea)    Generalized OA    Hypokalemia    Leukocytosis    Annual physical exam 04/03/2017    Hildred Laser, PT 10/22/2021, 12:03 PM  [facility] [address] [place], [place], [address] Phone: [phone]    Fax:  [phone]  Name: Wesly Whisenant MRN: [id number] Date of Birth: 1956-02-28

## 2021-10-22 NOTE — Therapy (Signed)
[facility] [address] [name], [place], [address] Phone: [phone]   Fax:  [phone]  Occupational Therapy Treatment  Patient Details  Name: Donald [name] MRN: [id number] Date of Birth: 03/02/1956 Referring Provider (OT): Dr. Riley Kill   Encounter Date: 10/21/2021   OT End of Session - 10/21/21 1650     Visit Number 6    Number of Visits 25    Date for OT Re-Evaluation 12/09/21    Authorization Type UHC    Authorization Time Period goals written for 12 weeks, pt scheduled only for 6 week trial at this time    OT Start Time 1405    OT Stop Time 1445    OT Time Calculation (min) 40 min             Past Medical History:  Diagnosis Date   Arthritis     Past Surgical History:  Procedure Laterality Date   APPENDECTOMY     IR ANGIO INTRA EXTRACRAN SEL COM CAROTID INNOMINATE BILAT MOD SED  05/28/2021   IR ANGIO VERTEBRAL SEL VERTEBRAL BILAT MOD SED  05/28/2021   IR IVC FILTER PLMT / S&I /IMG GUID/MOD SED  05/27/2021   IR US GUIDE VASC ACCESS RIGHT  05/27/2021    There were no vitals filed for this visit.   Subjective Assessment - 10/21/21 1648     Subjective  no reports of pain    Pertinent History 65 y.o. male hospitlized with  ICH R frontal lobe and small adjacent  SAH, pt transferred to rehab then he exeperienced worsening midline shift of R MCA, R ACA and DVT in RLE    Limitations hx of seizure, no estim without MD clearance, hx of memory deficit OSA    Patient Stated Goals be more independent    Currently in Pain? No/denies                      Treatment: Therapist finished fabrication and fitting of pt's resting hand splint. Pt demonstrates improved hand positioning with the splint. Pt's wife was educated in application and precautions. She returned demonstration.            OT Education - 10/21/21 1643     Education Details splint wear, care and precautions, pt's wife  returned demonstration of donning and doffing splint.    Person(s) Educated Patient;Spouse    Methods Explanation;Demonstration;Verbal cues;Tactile cues;Handout    Comprehension Verbalized understanding;Returned demonstration;Verbal cues required              OT Short Term Goals - 10/06/21 1417       OT SHORT TERM GOAL #1   Title Pt's family with be I with inital HEP.    Time 6    Period Weeks    Status New    Target Date 10/28/21      OT SHORT TERM GOAL #2   Title Pt's family with be I with splint wear, care and precautions and LUE positioning to minimize pain and contracture.    Time 6    Period Weeks    Status New      OT SHORT TERM GOAL #3   Title Pt will consistently wash face and chest with no more than min A.    Time 6    Period Weeks    Status New      OT SHORT TERM GOAL #4   Title Pt will attend to left hemibody space at least 10%  of the time with no more than mod v.c    Time 6    Period Weeks    Status On-going      OT SHORT TERM GOAL #5   Title Pt will assist with pulling down shirt in front at least 25% of them with min v.c    Time 6    Period Weeks    Status New      OT SHORT TERM GOAL #6   Title Pt will follow a simple  1 step command 75% of the time during therapy.    Time 6    Period Weeks    Status On-going               OT Long Term Goals - 09/16/21 1513       OT LONG TERM GOAL #1   Title Pt will donn shirt with mod A, and mod v.c    Time 12    Period Weeks    Status New      OT LONG TERM GOAL #2   Title Pt will attend to L hemibody space during ADLs and functional activities at least 25% of the time with min v.c    Time 12    Period Weeks    Status New      OT LONG TERM GOAL #3   Title Pt will perfom UB bathing with min A    Time 12    Period Weeks    Status New      OT LONG TERM GOAL #4   Title Pt will attend to a simple ADL or functional task for at least 15 mins with no more than min v.c    Time 12    Period Weeks     Status New      OT LONG TERM GOAL #5   Title Pt will tolerate P/ROM shoulder flexion to 90* with pain no greater than 2/10 in prep for dressing.    Time 12    Period Weeks    Status New      Long Term Additional Goals   Additional Long Term Goals Yes      OT LONG TERM GOAL #6   Title Pt will perform toilet transfers with mod A.    Time 12    Period Weeks    Status New                   Plan - 10/21/21 1644     Clinical Impression Statement Therapist finished fitting and fabrication of resting hand splint. Pt's wife was educated in application.    OT Occupational Profile and History Detailed Assessment- Review of Records and additional review of physical, cognitive, psychosocial history related to current functional performance    Occupational performance deficits (Please refer to evaluation for details): ADL's;IADL's;Rest and Sleep;Play;Leisure;Social Participation    Body Structure / Function / Physical Skills ADL;UE functional use;Flexibility;Pain;Vision;FMC;Proprioception;ROM;Gait;Coordination;GMC;Sensation;Decreased knowledge of precautions;Decreased knowledge of use of DME;Dexterity;Mobility;Tone;Strength;IADL    Cognitive Skills Attention;Energy/Drive;Memory;Orientation;Perception;Problem Solve;Safety Awareness;Sequencing;Thought;Understand    Rehab Potential Good    Clinical Decision Making Several treatment options, min-mod task modification necessary    Comorbidities Affecting Occupational Performance: May have comorbidities impacting occupational performance    Modification or Assistance to Complete Evaluation  Min-Moderate modification of tasks or assist with assess necessary to complete eval    OT Frequency 2x / week   plus eval   OT Duration 12 weeks    OT Treatment/Interventions Self-care/ADL training;Ultrasound;Visual/perceptual remediation/compensation;Patient/family  education;Balance training;Passive range of motion;Gait  Training;Paraffin;Cryotherapy;Fluidtherapy;Splinting;Building services engineer;Contrast Bath;Moist Heat;Therapeutic exercise;Manual Therapy;Therapeutic activities;Cognitive remediation/compensation;Neuromuscular education    Plan splint check and modificantions, NMR    Consulted and Agree with Plan of Care Patient;Family member/caregiver             Patient will benefit from skilled therapeutic intervention in order to improve the following deficits and impairments:   Body Structure / Function / Physical Skills: ADL, UE functional use, Flexibility, Pain, Vision, FMC, Proprioception, ROM, Gait, Coordination, GMC, Sensation, Decreased knowledge of precautions, Decreased knowledge of use of DME, Dexterity, Mobility, Tone, Strength, IADL Cognitive Skills: Attention, Energy/Drive, Memory, Orientation, Perception, Problem Solve, Safety Awareness, Sequencing, Thought, Understand     Visit Diagnosis: Stiffness of left wrist, not elsewhere classified  Unsteadiness on feet  Other lack of coordination  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)  Muscle weakness (generalized)    Problem List Patient Active Problem List   Diagnosis Date Noted   At high risk for inadequate nutritional intake 07/12/2021   Transaminitis 06/06/2021   Intraparenchymal hematoma of brain 06/04/2021   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HCC) 05/30/2021   Right lower lobe pneumonia 05/30/2021   Prediabetes 05/30/2021   Hyperglycemia 05/30/2021   Intracerebral hemorrhage 05/24/2021   Seizure ([facility]) 05/23/2021   Malnutrition of moderate degree 05/22/2021   Memory deficits    OSA (obstructive sleep apnea)    Generalized OA    Hypokalemia    Leukocytosis    Annual physical exam 04/03/2017    Johnella Crumm, OT/L 10/22/2021, 3:00 PM  [facility] [address] [place], [place],  [address] Phone: [phone]   Fax:  [phone]  Name: Donald [name] MRN: [id number] Date of Birth: 09/03/1956

## 2021-10-26 ENCOUNTER — Ambulatory Visit: Payer: No Typology Code available for payment source | Admitting: Occupational Therapy

## 2021-10-26 ENCOUNTER — Other Ambulatory Visit: Payer: Self-pay

## 2021-10-26 ENCOUNTER — Encounter: Payer: Self-pay | Admitting: Speech Pathology

## 2021-10-26 ENCOUNTER — Ambulatory Visit: Payer: No Typology Code available for payment source | Admitting: Speech Pathology

## 2021-10-26 ENCOUNTER — Encounter: Payer: Self-pay | Admitting: Rehabilitation

## 2021-10-26 ENCOUNTER — Ambulatory Visit: Payer: No Typology Code available for payment source | Admitting: Rehabilitation

## 2021-10-26 DIAGNOSIS — R41841 Cognitive communication deficit: Secondary | ICD-10-CM

## 2021-10-26 DIAGNOSIS — M6281 Muscle weakness (generalized): Secondary | ICD-10-CM

## 2021-10-26 DIAGNOSIS — R2681 Unsteadiness on feet: Secondary | ICD-10-CM

## 2021-10-26 DIAGNOSIS — R471 Dysarthria and anarthria: Secondary | ICD-10-CM

## 2021-10-26 DIAGNOSIS — R293 Abnormal posture: Secondary | ICD-10-CM

## 2021-10-26 DIAGNOSIS — I69154 Hemiplegia and hemiparesis following nontraumatic intracerebral hemorrhage affecting left non-dominant side: Secondary | ICD-10-CM

## 2021-10-26 DIAGNOSIS — R4701 Aphasia: Secondary | ICD-10-CM

## 2021-10-26 NOTE — Therapy (Signed)
[facility] [address] [place], [place], [address] Phone: [phone]   Fax:  [phone]  Physical Therapy Treatment  Patient Details  Name: Donald [name] MRN: [id number] Date of Birth: 01-06-1956 Referring Provider (PT): Faith Rogue   Encounter Date: 10/26/2021   PT End of Session - 10/26/21 1616     Visit Number 7    Number of Visits 25    Date for PT Re-Evaluation 12/10/21    Authorization Type [employer]-front office trying to contact insurance company for VL information    Progress Note Due on Visit 10    PT Start Time 1148    PT Stop Time 1230    PT Time Calculation (min) 42 min    Equipment Utilized During Treatment Gait belt    Activity Tolerance Patient limited by lethargy;Patient limited by fatigue    Behavior During Therapy Flat affect             Past Medical History:  Diagnosis Date   Arthritis     Past Surgical History:  Procedure Laterality Date   APPENDECTOMY     IR ANGIO INTRA EXTRACRAN SEL COM CAROTID INNOMINATE BILAT MOD SED  05/28/2021   IR ANGIO VERTEBRAL SEL VERTEBRAL BILAT MOD SED  05/28/2021   IR IVC FILTER PLMT / S&I /IMG GUID/MOD SED  05/27/2021   IR US GUIDE VASC ACCESS RIGHT  05/27/2021    There were no vitals filed for this visit.   Subjective Assessment - 10/26/21 1614     Subjective Wife reports no changes since last session    Patient Stated Goals Wife's goal:  get back him to some form of mobility to walk with cane or walker.    Currently in Pain? No/denies                   NMR:  Total A (+2) to get onto [facility] bench from w/c (placed anteriorly) with PT working to get one leg on either side of bench for add stretch.  Once on bench, worked throughout session to improve anterior pelvic tilt, lateral weight shifting for trunk shortening/lengthening and forward weight shift to carryover to improved functional mobility.  PT sat in  pts w/c posterior to pt and provided facilitation at pelvis and upper trunk for improved pelvic tilt but also to "lift" at thoracic trunk due to such kyphotic posture.  PT also having to remove pts R hand at times from mat surface as this caused pt to push.  Wife assisted during session with picture of granddaughter as this seemed very motivating for pt during session.  Also had pt reach for objects and play cards to keep pt engaged with this posture and to activate into this posture but also address L neglect having him scan L to find card to flip over.  Pt much more alert this session.    Note that PT did adjust pts w/c back to more upright position to maintain gains made in therapy session and prevent forward slouched posture.                       PT Education - 10/26/21 1614     Education Details Discussed at home getting him to lie on his R side for minutes at a time to improve perception.  Pt anxious initially but did settle and tolerate for serveral minutes.    Person(s) Educated Patient;Spouse    Methods Explanation;Demonstration  Comprehension Verbalized understanding              PT Short Term Goals - 10/20/21 0750       PT SHORT TERM GOAL #1   Title Pt will perform HEP with family supervision for improved strength, balance, transfers, and posture for improved mobility.  TARGET 11/10/2021    Baseline Pts wife they are performing stretching at home    Time 4    Period Weeks    Status On-going      PT SHORT TERM GOAL #2   Title Pt will perform squat/stand pivot transfer w/c<>mat with mod assist for decreased caregiver burden.    Baseline max assist 10/13/21    Time 4    Period Weeks    Status On-going      PT SHORT TERM GOAL #3   Title Pt will sit edge of mat, with intermittent RUE support and supervision at least 5 minutes, for improved participation with ADLs.    Baseline Pt was able to sit at EOB with RUE support at S x 3 mins 10/13/21    Time 4     Period Weeks    Status Revised      PT SHORT TERM GOAL #4   Title Pt will perform standing at sink/parallel bars, x 2 minutes, with mod assist, for improved participation in ADLs.    Baseline Did not perform as not appropriate at this time    Time 4    Period Weeks    Status On-going               PT Long Term Goals - 09/17/21 1328       PT LONG TERM GOAL #1   Title Pt will perform progression of HEP with family supervision for improved strength, balance, transfers, and posture.  TARGET 12/10/2021    Time 12    Period Weeks    Status New      PT LONG TERM GOAL #2   Title Pt will perform stand pivot transfer w/c<>mat with min assist for improved mobility, decreased caregiver burden.    Time 12    Period Weeks    Status New      PT LONG TERM GOAL #3   Title Pt will perform at least 5 minutes of standing at sink, with min assist, for improved participation in ADLs.    Time 12    Period Weeks    Status New      PT LONG TERM GOAL #4   Title Further gait assessment as strength and standing tolerance progresses, with goal to be written as appropriate.                   Plan - 10/26/21 1617     Clinical Impression Statement Skilled session focused on improving anterior pelvic tilt while seated on [place] bench while weight shifting laterally at times, performing reaching tasks, and actively moving into these positions to carryover to improved transfers.    Personal Factors and Comorbidities Comorbidity 2    Comorbidities Significant PMH: memory deficits, OSA.    Examination-Activity Limitations Locomotion Level;Transfers;Sit;Stand;Dressing;Toileting;Hygiene/Grooming;Bed Mobility;Bathing    Examination-Participation Restrictions Occupation;Community Activity;Church    Stability/Clinical Decision Making Evolving/Moderate complexity    Rehab Potential Fair    PT Frequency 2x / week    PT Duration 12 weeks   plus eval   PT Treatment/Interventions ADLs/Self Care Home  Management;Functional mobility training;Therapeutic activities;Therapeutic exercise;Balance training;Patient/family education;Orthotic Fit/Training;Wheelchair mobility training;Manual techniques;Passive range of  motion;Neuromuscular re-education    PT Next Visit Plan Continue to work on sitting on [place] bench (legs abd on either side) for ant pelvic tilt, trunk dissociation, lying on R side to reduce pusher tendencies    PT Home Exercise Plan sitting balance WS tasks demoed by PT to CG    Consulted and Agree with Plan of Care Patient;Family member/caregiver             Patient will benefit from skilled therapeutic intervention in order to improve the following deficits and impairments:  Decreased range of motion, Impaired tone, Difficulty walking, Decreased activity tolerance, Decreased balance, Impaired flexibility, Decreased mobility, Decreased strength, Postural dysfunction  Visit Diagnosis: Unsteadiness on feet  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)  Muscle weakness (generalized)  Abnormal posture     Problem List Patient Active Problem List   Diagnosis Date Noted   At high risk for inadequate nutritional intake 07/12/2021   Transaminitis 06/06/2021   Intraparenchymal hematoma of brain 06/04/2021   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HCC) 05/30/2021   Right lower lobe pneumonia 05/30/2021   Prediabetes 05/30/2021   Hyperglycemia 05/30/2021   Intracerebral hemorrhage 05/24/2021   Seizure (HCC) 05/23/2021   Malnutrition of moderate degree 05/22/2021   Memory deficits    OSA (obstructive sleep apnea)    Generalized OA    Hypokalemia    Leukocytosis    Annual physical exam 04/03/2017    Harriet Butte, PT, MPT [facility] [address] [place], [place], [address] Phone: [phone]   Fax:  [phone] 10/26/21, 4:24 PM   Name: Donald [name] MRN:  [id number] Date of Birth: 06-Dec-1956

## 2021-10-26 NOTE — Therapy (Signed)
[facility] [address] [place], [place], [address] Phone: [phone]   Fax:  [phone]  Speech Language Pathology Treatment  Patient Details  Name: Donald [name] MRN: [id number] Date of Birth: [birth year] Referring Provider (SLP): Meredith Staggers, MD   Encounter Date: 10/26/2021   End of Session - 10/26/21 1425     Visit Number 5    Number of Visits 17    Date for SLP Re-Evaluation 11/12/21    Authorization Type U[employer]    SLP Start Time 67    SLP Stop Time  1145    SLP Time Calculation (min) 43 min             Past Medical History:  Diagnosis Date   Arthritis     Past Surgical History:  Procedure Laterality Date   APPENDECTOMY     IR ANGIO INTRA EXTRACRAN SEL COM CAROTID INNOMINATE BILAT MOD SED  05/28/2021   IR ANGIO VERTEBRAL SEL VERTEBRAL BILAT MOD SED  05/28/2021   IR IVC FILTER PLMT / S&I /IMG GUID/MOD SED  05/27/2021   IR US GUIDE VASC ACCESS RIGHT  05/27/2021    There were no vitals filed for this visit.   Subjective Assessment - 10/26/21 1114     Subjective We did coin sorting, card sorting and 1 step commands"    Patient is accompained by: Family member   wife, [name]   Currently in Pain? No/denies                   ADULT SLP TREATMENT - 10/26/21 1114       General Information   Behavior/Cognition Distractible;Requires cueing;Confused;Doesn't follow directions      Treatment Provided   Treatment provided Cognitive-Linquistic      Cognitive-Linquistic Treatment   Treatment focused on Cognition;Aphasia;Dysarthria    Skilled Treatment Mohmmad required frequent verbal cues for eyes open, eye contact throughout session. Targeted recall of family names with frequent max A and extended time. Generate cheat sheet with large font of immediate family names and 2 grand chldren. Frequent max verbal cues to read 3/7 names, max A to look to the left where I placed highlighted mark  for him to locate and eventually moved paper to his right and used h/h to place his finger next to the name he was to read. Initiated basic calendar with 3 immediate family birthdays in November. He located and read 2 birthdays on the calendar with max cues for scanning, right of midline placement and h/h placing his finger on the date. Frequent mod A to sustain attention to task or simple short conversation      Assessment / Recommendations / [place] with current plan of care      Progression Toward Goals   Progression toward goals Progressing toward goals              SLP Education - 10/26/21 1422     Education Details reduce complexity of tasks to sorting 2 obviously different items (ie: pennies and quarters vs dimes and nickels, colors only with cards instead of all suits at once    Person(s) Educated Patient;Spouse    Methods Explanation;Demonstration;Tactile cues;Verbal cues;Handout    Comprehension Verbalized understanding;Returned demonstration;Verbal cues required;Tactile cues required;Need further instruction              SLP Short Term Goals - 10/26/21 1424       SLP SHORT TERM GOAL #1   Title Pt  will establish and maintain eye contact during auditory instructions given occasional mod A to aid focused attention over 3 sessions    Time 3    Period Weeks    Status Not Met   ongoing   Target Date 11/12/21   extended all STGs 4 weeks as pt started tx ~1 month after eval     SLP SHORT TERM GOAL #2   Title Pt will comprehend and demonstrate 1-step directions with 80% accuracy given no more than 3 repetitions over 3 sessions    Time 3    Period Weeks    Status Not Met   ongoing   Target Date 11/12/21      SLP SHORT TERM GOAL #3   Title Pt will maintain topic to related question for 30+ seconds given occasional mod A over 3 sessions    Time 3    Period Weeks    Status Not Met   ongoing   Target Date 11/12/21      SLP SHORT TERM GOAL #4   Title Pt  will use multimodal communication to aid pt's comprehension and attention for 2 functional scenarios    Time 3    Period Weeks    Status Not Met   ongoing   Target Date 11/12/21      SLP SHORT TERM GOAL #5   Title Pt will comprehend simple yes/no questions with 90% accuracy with 2 or less repetitions over 2 sessions    Time 3    Period Weeks    Status On-going   ongoing   Target Date 11/12/21              SLP Long Term Goals - 10/26/21 1425       SLP LONG TERM GOAL #1   Title Pt will comprehend and demonstrate functional 2-step directions with 80% accuracy given no more than 3 repetitions over 3 sessions    Time 7    Period Weeks    Status On-going      SLP LONG TERM GOAL #2   Title Pt will participate in 1 minute conversation of personally relevant topics with no overt shifts in topic over 3 sessions    Time 7    Period Weeks    Status On-going      SLP LONG TERM GOAL #3   Title Pt will comprehend functional mod complex yes/no questions with 80% accuracy with 2 or less repetitions over 2 sessions    Time 7    Period Weeks    Status On-going      SLP LONG TERM GOAL #4   Title Pt's wife will report increased comprehension and participation in daily activities with use of learned compensations by last ST session    Time 7    Period Weeks    Status On-going              Plan - 10/26/21 1423     Clinical Impression Statement Zohar continues to present with severe cognitive communciation impairments, 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 Reviewed recommendations for dysarthria HEP requiring max A. SLP provided communication strateiges to assist with patient comprehension and topic maintenance. Sustained attention, inititation, processing with max A. Pt required short, simple 1-2 word instructions with repetition. Occasional increasing to max A for eye contact, hand off of his head. Ongoing spouse education re: compensations for cognition and  communication reqiured.  Continue skilled ST to maximize cognition, comprehension and intelligilbity for safety and  to reduce caregiver burden.    Speech Therapy Frequency 2x / week    Duration 8 weeks   6 week trial therapy   Treatment/Interventions Compensatory strategies;Functional tasks;Patient/family education;Multimodal communcation approach;Compensatory techniques;Internal/external aids;SLP instruction and feedback    Potential Considerations Severity of impairments;Previous level of function;Ability to learn/carryover information;Cooperation/participation level;Medical prognosis             Patient will benefit from skilled therapeutic intervention in order to improve the following deficits and impairments:   Cognitive communication deficit  Aphasia  Dysarthria and anarthria    Problem List Patient Active Problem List   Diagnosis Date Noted   At high risk for inadequate nutritional intake 07/12/2021   Transaminitis 06/06/2021   Intraparenchymal hematoma of brain 06/04/2021   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[place]) 05/30/2021   Right lower lobe pneumonia 05/30/2021   Prediabetes 05/30/2021   Hyperglycemia 05/30/2021   Intracerebral hemorrhage 05/24/2021   Seizure ([place]) 05/23/2021   Malnutrition of moderate degree 05/22/2021   Memory deficits    OSA (obstructive sleep apnea)    Generalized OA    Hypokalemia    Leukocytosis    Annual physical exam 04/03/2017    , Annye Rusk MS, CCC-SLP 10/26/2021, 3:59 PM  [facility] [address] [place], [place], [address] Phone: [phone]   Fax:  [phone]   Name: Jakarie Pember MRN: [id number] Date of Birth: 1956/11/28

## 2021-10-26 NOTE — Therapy (Signed)
[facility] [address] [place], [place], [address] Phone: [phone]   Fax:  [phone]  Occupational Therapy Treatment  Patient Details  Name: Donald [name] MRN: [id number] Date of Birth: 25-Nov-1956 Referring Provider (OT): Dr. Riley Kill   Encounter Date: 10/26/2021   OT End of Session - 10/26/21 1329     Visit Number 7    Number of Visits 25    Date for OT Re-Evaluation 12/09/21    Authorization Type UHC    Authorization Time Period goals written for 12 weeks, pt scheduled only for 6 week trial at this time    OT Start Time 1230    OT Stop Time 1315    OT Time Calculation (min) 45 min    Activity Tolerance Patient limited by fatigue;Patient limited by lethargy    Behavior During Therapy Flat affect             Past Medical History:  Diagnosis Date   Arthritis     Past Surgical History:  Procedure Laterality Date   APPENDECTOMY     IR ANGIO INTRA EXTRACRAN SEL COM CAROTID INNOMINATE BILAT MOD SED  05/28/2021   IR ANGIO VERTEBRAL SEL VERTEBRAL BILAT MOD SED  05/28/2021   IR IVC FILTER PLMT / S&I /IMG GUID/MOD SED  05/27/2021   IR US GUIDE VASC ACCESS RIGHT  05/27/2021    There were no vitals filed for this visit.   Subjective Assessment - 10/26/21 1326     Subjective  Pt w/ c/o rolling onto Rt side secondary to pushing from Rt side    Pertinent History 65 y.o. male hospitlized with  ICH R frontal lobe and small adjacent  SAH, pt transferred to rehab then he exeperienced worsening midline shift of R MCA, R ACA and DVT in RLE    Limitations hx of seizure, no estim without MD clearance, hx of memory deficit OSA    Patient Stated Goals be more independent    Currently in Pain? Yes   unable to rate - pt w/ pain due to tightness in trunk, pelvis and head            Splint check - resting hand splint fitting well w/ no signs of pressure areas.   Supine on wedge: worked on anterior pelvic tilt (w/  use of sheet), followed by trunk/pelvic disassociation w/ assist from P.T. in prep for sitting EOB and for increased trunk/pelvic mobility Also worked on cervical movement to neutral and neck retraction w/ tactile feedback as able. Worked on crossing midline and ipsilateral reaching in supine.   Rolling to Rt side for proprioceptive input/body awareness as pt likes to push from Rt side. While sidelying, worked on gentle pelvic mobs and traction from trunk   Supine to seated and back to w/c with max assist x 2.   (P.T. able to assist during session)                        OT Short Term Goals - 10/06/21 1417       OT SHORT TERM GOAL #1   Title Pt's family with be I with inital HEP.    Time 6    Period Weeks    Status New    Target Date 10/28/21      OT SHORT TERM GOAL #2   Title Pt's family with be I with splint wear, care and precautions and LUE positioning to minimize pain and contracture.  Time 6    Period Weeks    Status New      OT SHORT TERM GOAL #3   Title Pt will consistently wash face and chest with no more than min A.    Time 6    Period Weeks    Status New      OT SHORT TERM GOAL #4   Title Pt will attend to left hemibody space at least 10% of the time with no more than mod v.c    Time 6    Period Weeks    Status On-going      OT SHORT TERM GOAL #5   Title Pt will assist with pulling down shirt in front at least 25% of them with min v.c    Time 6    Period Weeks    Status New      OT SHORT TERM GOAL #6   Title Pt will follow a simple  1 step command 75% of the time during therapy.    Time 6    Period Weeks    Status On-going               OT Long Term Goals - 09/16/21 1513       OT LONG TERM GOAL #1   Title Pt will donn shirt with mod A, and mod v.c    Time 12    Period Weeks    Status New      OT LONG TERM GOAL #2   Title Pt will attend to L hemibody space during ADLs and functional activities at least 25% of the time  with min v.c    Time 12    Period Weeks    Status New      OT LONG TERM GOAL #3   Title Pt will perfom UB bathing with min A    Time 12    Period Weeks    Status New      OT LONG TERM GOAL #4   Title Pt will attend to a simple ADL or functional task for at least 15 mins with no more than min v.c    Time 12    Period Weeks    Status New      OT LONG TERM GOAL #5   Title Pt will tolerate P/ROM shoulder flexion to 90* with pain no greater than 2/10 in prep for dressing.    Time 12    Period Weeks    Status New      Long Term Additional Goals   Additional Long Term Goals Yes      OT LONG TERM GOAL #6   Title Pt will perform toilet transfers with mod A.    Time 12    Period Weeks    Status New                   Plan - 10/26/21 1330     Clinical Impression Statement Pt more alert today as O.T. had assistance from P.T. to work on trunk/pelvic movements supine and sidelying    OT Occupational Profile and History Detailed Assessment- Review of Records and additional review of physical, cognitive, psychosocial history related to current functional performance    Occupational performance deficits (Please refer to evaluation for details): ADL's;IADL's;Rest and Sleep;Play;Leisure;Social Participation    Body Structure / Function / Physical Skills ADL;UE functional use;Flexibility;Pain;Vision;FMC;Proprioception;ROM;Gait;Coordination;GMC;Sensation;Decreased knowledge of precautions;Decreased knowledge of use of DME;Dexterity;Mobility;Tone;Strength;IADL    Cognitive Skills Attention;Energy/Drive;Memory;Orientation;Perception;Problem Solve;Safety Awareness;Sequencing;Thought;Understand  Rehab Potential Good    Clinical Decision Making Several treatment options, min-mod task modification necessary    Comorbidities Affecting Occupational Performance: May have comorbidities impacting occupational performance    Modification or Assistance to Complete Evaluation  Min-Moderate  modification of tasks or assist with assess necessary to complete eval    OT Frequency 2x / week   plus eval   OT Duration 12 weeks    OT Treatment/Interventions Self-care/ADL training;Ultrasound;Visual/perceptual remediation/compensation;Patient/family education;Balance training;Passive range of motion;Gait Training;Paraffin;Cryotherapy;Fluidtherapy;Splinting;Building services engineer;Contrast Bath;Moist Heat;Therapeutic exercise;Manual Therapy;Therapeutic activities;Cognitive remediation/compensation;Neuromuscular education    Plan continue NMR    Consulted and Agree with Plan of Care Patient;Family member/caregiver             Patient will benefit from skilled therapeutic intervention in order to improve the following deficits and impairments:   Body Structure / Function / Physical Skills: ADL, UE functional use, Flexibility, Pain, Vision, FMC, Proprioception, ROM, Gait, Coordination, GMC, Sensation, Decreased knowledge of precautions, Decreased knowledge of use of DME, Dexterity, Mobility, Tone, Strength, IADL Cognitive Skills: Attention, Energy/Drive, Memory, Orientation, Perception, Problem Solve, Safety Awareness, Sequencing, Thought, Understand     Visit Diagnosis: 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)  Muscle weakness (generalized)    Problem List Patient Active Problem List   Diagnosis Date Noted   At high risk for inadequate nutritional intake 07/12/2021   Transaminitis 06/06/2021   Intraparenchymal hematoma of brain 06/04/2021   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HCC) 05/30/2021   Right lower lobe pneumonia 05/30/2021   Prediabetes 05/30/2021   Hyperglycemia 05/30/2021   Intracerebral hemorrhage 05/24/2021   Seizure (HCC) 05/23/2021   Malnutrition of moderate degree 05/22/2021   Memory deficits    OSA (obstructive sleep apnea)    Generalized OA    Hypokalemia    Leukocytosis    Annual physical  exam 04/03/2017    Kelli Churn, OTR/L 10/26/2021, 1:32 PM  [facility] [address] [place], [place], [address] Phone: [phone]   Fax:  [phone]  Name: Jayme Cham MRN: [id number] Date of Birth: Jun 08, 1956

## 2021-10-28 ENCOUNTER — Encounter: Payer: Self-pay | Admitting: Occupational Therapy

## 2021-10-28 ENCOUNTER — Ambulatory Visit: Payer: No Typology Code available for payment source | Admitting: Speech Pathology

## 2021-10-28 ENCOUNTER — Encounter: Payer: Self-pay | Admitting: Speech Pathology

## 2021-10-28 ENCOUNTER — Ambulatory Visit: Payer: No Typology Code available for payment source

## 2021-10-28 ENCOUNTER — Other Ambulatory Visit: Payer: Self-pay

## 2021-10-28 ENCOUNTER — Ambulatory Visit: Payer: No Typology Code available for payment source | Admitting: Occupational Therapy

## 2021-10-28 DIAGNOSIS — R2681 Unsteadiness on feet: Secondary | ICD-10-CM

## 2021-10-28 DIAGNOSIS — R293 Abnormal posture: Secondary | ICD-10-CM

## 2021-10-28 DIAGNOSIS — R41841 Cognitive communication deficit: Secondary | ICD-10-CM

## 2021-10-28 DIAGNOSIS — M25612 Stiffness of left shoulder, not elsewhere classified: Secondary | ICD-10-CM

## 2021-10-28 DIAGNOSIS — M6281 Muscle weakness (generalized): Secondary | ICD-10-CM

## 2021-10-28 DIAGNOSIS — I69154 Hemiplegia and hemiparesis following nontraumatic intracerebral hemorrhage affecting left non-dominant side: Secondary | ICD-10-CM | POA: Diagnosis not present

## 2021-10-28 DIAGNOSIS — R471 Dysarthria and anarthria: Secondary | ICD-10-CM

## 2021-10-28 DIAGNOSIS — M25632 Stiffness of left wrist, not elsewhere classified: Secondary | ICD-10-CM

## 2021-10-28 DIAGNOSIS — M25622 Stiffness of left elbow, not elsewhere classified: Secondary | ICD-10-CM

## 2021-10-28 DIAGNOSIS — R278 Other lack of coordination: Secondary | ICD-10-CM

## 2021-10-28 DIAGNOSIS — R4701 Aphasia: Secondary | ICD-10-CM

## 2021-10-28 NOTE — Therapy (Signed)
[facility] [address] [place], [place], [address] Phone: [phone]   Fax:  [phone]  Occupational Therapy Treatment  Patient Details  Name: Donald [name] MRN: [id number] Date of Birth: 08-24-1956 Referring Provider (OT): Dr. Riley Kill   Encounter Date: 10/28/2021   OT End of Session - 10/28/21 1332     Visit Number 8    Number of Visits 25    Date for OT Re-Evaluation 12/09/21    Authorization Type UHC    Authorization Time Period goals written for 12 weeks, pt scheduled only for 6 week trial at this time    OT Start Time 1234    OT Stop Time 1315    OT Time Calculation (min) 41 min    Behavior During Therapy Flat affect             Past Medical History:  Diagnosis Date   Arthritis     Past Surgical History:  Procedure Laterality Date   APPENDECTOMY     IR ANGIO INTRA EXTRACRAN SEL COM CAROTID INNOMINATE BILAT MOD SED  05/28/2021   IR ANGIO VERTEBRAL SEL VERTEBRAL BILAT MOD SED  05/28/2021   IR IVC FILTER PLMT / S&I /IMG GUID/MOD SED  05/27/2021   IR US GUIDE VASC ACCESS RIGHT  05/27/2021    There were no vitals filed for this visit.   Subjective Assessment - 10/28/21 1329     Subjective  No reports of pain    Pertinent History 65 y.o. male hospitlized with  ICH R frontal lobe and small adjacent  SAH, pt transferred to rehab then he exeperienced worsening midline shift of R MCA, R ACA and DVT in RLE    Limitations hx of seizure, no estim without MD clearance, hx of memory deficit OSA    Patient Stated Goals be more independent    Currently in Pain? No/denies                Treatment: Card matching and scanning for cards on tabletop, max difficulty/ v.c  Therapist assisted pt by passively moving RUE to each card so that pt would locate and name after picking up, max v.c. facillitation Pt's LUE hand demonstrates improved flexibility  and MP, IP passive ROM following splint  wear.                  OT Education - 10/28/21 1330     Education Details Reviewed splint application as pt's straps were in the wrong position, reviewed with pt's wife, she verbalized understanding. gentle P/ROM elbow flexion / extension, wrist flexion/ extension, supination pronation and composite and MP finger flexion- pt's wife was instructed and then she returned demonstration.    Person(s) Educated Patient;Spouse    Methods Explanation;Demonstration;Verbal cues    Comprehension Verbalized understanding;Returned demonstration;Verbal cues required              OT Short Term Goals - 10/28/21 1333       OT SHORT TERM GOAL #1   Title Pt's family with be I with inital HEP.    Time 6    Period Weeks    Status On-going    Target Date 10/28/21      OT SHORT TERM GOAL #2   Title Pt's family with be I with splint wear, care and precautions and LUE positioning to minimize pain and contracture.    Time 6    Period Weeks    Status On-going      OT  SHORT TERM GOAL #3   Title Pt will consistently wash face and chest with no more than min A.    Time 6    Period Weeks    Status New      OT SHORT TERM GOAL #4   Title Pt will attend to left hemibody space at least 10% of the time with no more than mod v.c    Time 6    Period Weeks    Status On-going      OT SHORT TERM GOAL #5   Title Pt will assist with pulling down shirt in front at least 25% of them with min v.c    Time 6    Period Weeks    Status On-going      OT SHORT TERM GOAL #6   Title Pt will follow a simple  1 step command 75% of the time during therapy.    Time 6    Period Weeks    Status On-going               OT Long Term Goals - 09/16/21 1513       OT LONG TERM GOAL #1   Title Pt will donn shirt with mod A, and mod v.c    Time 12    Period Weeks    Status New      OT LONG TERM GOAL #2   Title Pt will attend to L hemibody space during ADLs and functional activities at least 25% of  the time with min v.c    Time 12    Period Weeks    Status New      OT LONG TERM GOAL #3   Title Pt will perfom UB bathing with min A    Time 12    Period Weeks    Status New      OT LONG TERM GOAL #4   Title Pt will attend to a simple ADL or functional task for at least 15 mins with no more than min v.c    Time 12    Period Weeks    Status New      OT LONG TERM GOAL #5   Title Pt will tolerate P/ROM shoulder flexion to 90* with pain no greater than 2/10 in prep for dressing.    Time 12    Period Weeks    Status New      Long Term Additional Goals   Additional Long Term Goals Yes      OT LONG TERM GOAL #6   Title Pt will perform toilet transfers with mod A.    Time 12    Period Weeks    Status New                   Plan - 10/28/21 1333     Clinical Impression Statement Pt is progressing towards goals. He is limited by severity of deficits.    OT Occupational Profile and History Detailed Assessment- Review of Records and additional review of physical, cognitive, psychosocial history related to current functional performance    Occupational performance deficits (Please refer to evaluation for details): ADL's;IADL's;Rest and Sleep;Play;Leisure;Social Participation    Body Structure / Function / Physical Skills ADL;UE functional use;Flexibility;Pain;Vision;FMC;Proprioception;ROM;Gait;Coordination;GMC;Sensation;Decreased knowledge of precautions;Decreased knowledge of use of DME;Dexterity;Mobility;Tone;Strength;IADL    Cognitive Skills Attention;Energy/Drive;Memory;Orientation;Perception;Problem Solve;Safety Awareness;Sequencing;Thought;Understand    Rehab Potential Good    Clinical Decision Making Several treatment options, min-mod task modification necessary    Comorbidities Affecting Occupational  Performance: May have comorbidities impacting occupational performance    Modification or Assistance to Complete Evaluation  Min-Moderate modification of tasks or assist  with assess necessary to complete eval    OT Frequency 2x / week   plus eval   OT Duration 12 weeks    OT Treatment/Interventions Self-care/ADL training;Ultrasound;Visual/perceptual remediation/compensation;Patient/family education;Balance training;Passive range of motion;Gait Training;Paraffin;Cryotherapy;Fluidtherapy;Splinting;Building services engineer;Contrast Bath;Moist Heat;Therapeutic exercise;Manual Therapy;Therapeutic activities;Cognitive remediation/compensation;Neuromuscular education    Plan Check STG, continue NMR    Consulted and Agree with Plan of Care Patient;Family member/caregiver             Patient will benefit from skilled therapeutic intervention in order to improve the following deficits and impairments:   Body Structure / Function / Physical Skills: ADL, UE functional use, Flexibility, Pain, Vision, FMC, Proprioception, ROM, Gait, Coordination, GMC, Sensation, Decreased knowledge of precautions, Decreased knowledge of use of DME, Dexterity, Mobility, Tone, Strength, IADL Cognitive Skills: Attention, Energy/Drive, Memory, Orientation, Perception, Problem Solve, Safety Awareness, Sequencing, Thought, Understand     Visit Diagnosis: 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)  Muscle weakness (generalized)  Unsteadiness on feet  Abnormal posture  Stiffness of left wrist, not elsewhere classified  Other lack of coordination  Stiffness of left shoulder, not elsewhere classified  Stiffness of left elbow, not elsewhere classified    Problem List Patient Active Problem List   Diagnosis Date Noted   At high risk for inadequate nutritional intake 07/12/2021   Transaminitis 06/06/2021   Intraparenchymal hematoma of brain 06/04/2021   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HCC) 05/30/2021   Right lower lobe pneumonia 05/30/2021   Prediabetes 05/30/2021   Hyperglycemia 05/30/2021   Intracerebral  hemorrhage 05/24/2021   Seizure (HCC) 05/23/2021   Malnutrition of moderate degree 05/22/2021   Memory deficits    OSA (obstructive sleep apnea)    Generalized OA    Hypokalemia    Leukocytosis    Annual physical exam 04/03/2017    Zaccary Creech, OT/L 10/28/2021, 1:34 PM  [facility] [address] [place], [place], [address] Phone: [phone]   Fax:  [phone]  Name: Dontravious Camille MRN: [id number] Date of Birth: February 22, 1956

## 2021-10-28 NOTE — Therapy (Signed)
[facility] [address] [place], [place], [address] Phone: [phone]   Fax:  [phone]  Speech Language Pathology Treatment  Patient Details  Name: Donald [name] MRN: [id number] Date of Birth: 08/08/1956 Referring Provider (SLP): Meredith Staggers, MD   Encounter Date: 10/28/2021   End of Session - 10/28/21 1336     Visit Number 6    Number of Visits 17    Date for SLP Re-Evaluation 11/12/21    Authorization Type UHC    SLP Start Time 6226    SLP Stop Time  1228    SLP Time Calculation (min) 43 min    Activity Tolerance Patient limited by lethargy;Patient limited by fatigue             Past Medical History:  Diagnosis Date   Arthritis     Past Surgical History:  Procedure Laterality Date   APPENDECTOMY     IR ANGIO INTRA EXTRACRAN SEL COM CAROTID INNOMINATE BILAT MOD SED  05/28/2021   IR ANGIO VERTEBRAL SEL VERTEBRAL BILAT MOD SED  05/28/2021   IR IVC FILTER PLMT / S&I /IMG GUID/MOD SED  05/27/2021   IR US GUIDE VASC ACCESS RIGHT  05/27/2021    There were no vitals filed for this visit.   Subjective Assessment - 10/28/21 1146     Patient is accompained by: Family member   wife, [name]   Currently in Pain? No/denies                   ADULT SLP TREATMENT - 10/28/21 1229       General Information   Behavior/Cognition Doesn't follow directions;Requires cueing;Distractible;Confused      Treatment Provided   Treatment provided Cognitive-Linquistic      Cognitive-Linquistic Treatment   Treatment focused on Cognition;Aphasia;Dysarthria    Skilled Treatment Targeted recall of immediate family names, left attention and sustained attention reading family names with yellow highlight to cue to look left. With consistent, Max A, Sandra located and read family names. He reequired consistent re-direction every 30-45 seconds . Low volume with reduced intelligilbity - Completed loud AH 7/7x  with average of 85dB with consistent modeling and verbal cues for breath support and volume. Automatic speech tasks focusing on volume and breath were completed with average of  75dB with frequent modeling. He repeated persoanlly releveant sentences with averge of 72dB with consistent re-direction, verbal cues. 3x, he made prolonged vowel "oooooo" when I asked him to repeat a sentence or not repeat, but verbalize something un-related. Spontaneous speech is vague and often not relevant to task. Added to personally relevant phrases. Attention, left attention and following simple 1 step direction with simple card sort (find red card, f:2) with 50% accuracy and max A. when asked the color of 2 different cards, again he was 50% accurate at [place] color, with choice of 2 colors (is it red or blue) he was 70% accuracy, again consistent re-direction to task.      Assessment / Recommendations / Plan   Plan Continue with current plan of care      Progression Toward Goals   Progression toward goals Not progressing toward goals (comment)                SLP Short Term Goals - 10/28/21 1336       SLP SHORT TERM GOAL #1   Title Pt will establish and maintain eye contact during auditory instructions given occasional mod A to aid focused attention  over 3 sessions    Time 3    Period Weeks    Status Not Met   ongoing   Target Date 11/12/21   extended all STGs 4 weeks as pt started tx ~1 month after eval     SLP SHORT TERM GOAL #2   Title Pt will comprehend and demonstrate 1-step directions with 80% accuracy given no more than 3 repetitions over 3 sessions    Time 3    Period Weeks    Status Not Met   ongoing   Target Date 11/12/21      SLP SHORT TERM GOAL #3   Title Pt will maintain topic to related question for 30+ seconds given occasional mod A over 3 sessions    Time 3    Period Weeks    Status Not Met   ongoing   Target Date 11/12/21      SLP SHORT TERM GOAL #4   Title Pt will use  multimodal communication to aid pt's comprehension and attention for 2 functional scenarios    Time 3    Period Weeks    Status Not Met   ongoing   Target Date 11/12/21      SLP SHORT TERM GOAL #5   Title Pt will comprehend simple yes/no questions with 90% accuracy with 2 or less repetitions over 2 sessions    Time 3    Period Weeks    Status On-going   ongoing   Target Date 11/12/21              SLP Long Term Goals - 10/28/21 1336       SLP LONG TERM GOAL #1   Title Pt will comprehend and demonstrate functional 2-step directions with 80% accuracy given no more than 3 repetitions over 3 sessions    Time 7    Period Weeks    Status On-going      SLP LONG TERM GOAL #2   Title Pt will participate in 1 minute conversation of personally relevant topics with no overt shifts in topic over 3 sessions    Time 7    Period Weeks    Status On-going      SLP LONG TERM GOAL #3   Title Pt will comprehend functional mod complex yes/no questions with 80% accuracy with 2 or less repetitions over 2 sessions    Time 7    Period Weeks    Status On-going      SLP LONG TERM GOAL #4   Title Pt's wife will report increased comprehension and participation in daily activities with use of learned compensations by last ST session    Time 7    Period Weeks    Status On-going              Plan - 10/28/21 1335     Clinical Impression Statement Saiquan continues to present with severe cognitive communciation impairments, 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 Reviewed recommendations for dysarthria HEP requiring max A. SLP provided communication strateiges to assist with patient comprehension and topic maintenance. Sustained attention, inititation, processing with max A. Pt required short, simple 1-2 word instructions with repetition. Occasional increasing to max A for eye contact, hand off of his head. Ongoing spouse education re: compensations for cognition and  communication reqiured.  Continue skilled ST to maximize cognition, comprehension and intelligilbity for safety and to reduce caregiver burden.    Speech Therapy Frequency 2x / week    Duration  8 weeks   6 weeks trial threapy   Treatment/Interventions Compensatory strategies;Functional tasks;Patient/family education;Multimodal communcation approach;Compensatory techniques;Internal/external aids;SLP instruction and feedback             Patient will benefit from skilled therapeutic intervention in order to improve the following deficits and impairments:   Cognitive communication deficit  Aphasia  Dysarthria and anarthria    Problem List Patient Active Problem List   Diagnosis Date Noted   At high risk for inadequate nutritional intake 07/12/2021   Transaminitis 06/06/2021   Intraparenchymal hematoma of brain 06/04/2021   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[place]) 05/30/2021   Right lower lobe pneumonia 05/30/2021   Prediabetes 05/30/2021   Hyperglycemia 05/30/2021   Intracerebral hemorrhage 05/24/2021   Seizure ([place]) 05/23/2021   Malnutrition of moderate degree 05/22/2021   Memory deficits    OSA (obstructive sleep apnea)    Generalized OA    Hypokalemia    Leukocytosis    Annual physical exam 04/03/2017    Quaid Yeakle, Annye Rusk MS, CCC-SLP 10/28/2021, 1:37 PM  [facility] [address] [place], [place], [address] Phone: [phone]   Fax:  [phone]   Name: Galo Sayed MRN: [id number] Date of Birth: [birth year]

## 2021-10-28 NOTE — Therapy (Signed)
[facility] [address] [place], [place], [address] Phone: [phone]   Fax:  [phone]  Physical Therapy Treatment  Patient Details  Name: Donald [name] MRN: [id number] Date of Birth: 17-Jul-1956 Referring Provider (PT): Faith Rogue   Encounter Date: 10/28/2021   PT End of Session - 10/28/21 1125     Visit Number 8    Number of Visits 25    Date for PT Re-Evaluation 12/10/21    Authorization Type U[employer]-front office trying to contact insurance company for VL information    Progress Note Due on Visit 10    PT Start Time 1125    PT Stop Time 1140    PT Time Calculation (min) 15 min    Equipment Utilized During Treatment Gait belt    Activity Tolerance Patient limited by lethargy;Patient limited by fatigue    Behavior During Therapy Flat affect             Past Medical History:  Diagnosis Date   Arthritis     Past Surgical History:  Procedure Laterality Date   APPENDECTOMY     IR ANGIO INTRA EXTRACRAN SEL COM CAROTID INNOMINATE BILAT MOD SED  05/28/2021   IR ANGIO VERTEBRAL SEL VERTEBRAL BILAT MOD SED  05/28/2021   IR IVC FILTER PLMT / S&I /IMG GUID/MOD SED  05/27/2021   IR US GUIDE VASC ACCESS RIGHT  05/27/2021    There were no vitals filed for this visit.   Subjective Assessment - 10/28/21 1124     Subjective No changes to report.  (25 min late due to transportation delay)    Patient is accompained by: Family member    Pertinent History See problem list for PMH    Patient Stated Goals Wife's goal:  get back him to some form of mobility to walk with cane or walker.    Pain Onset More than a month ago             Due to time constraints, session limited to postural training, PROM and proprioception tasks in sitting. discussed possible upcoming trip to [facility] and offered suggestions regarding airline accomodations/concerns if they decide to fly. possibility  they may drive to [place] to stay with family and break up trip.  Performed random reaching with RUE encouraging WS to R as well as cross body movements, FWD WS for pressure relief and positioning to posterior seat requiring MaxA.                             PT Short Term Goals - 10/20/21 0750       PT SHORT TERM GOAL #1   Title Pt will perform HEP with family supervision for improved strength, balance, transfers, and posture for improved mobility.  TARGET 11/10/2021    Baseline Pts wife they are performing stretching at home    Time 4    Period Weeks    Status On-going      PT SHORT TERM GOAL #2   Title Pt will perform squat/stand pivot transfer w/c<>mat with mod assist for decreased caregiver burden.    Baseline max assist 10/13/21    Time 4    Period Weeks    Status On-going      PT SHORT TERM GOAL #3   Title Pt will sit edge of mat, with intermittent RUE support and supervision at least 5 minutes, for improved participation with ADLs.  Baseline Pt was able to sit at EOB with RUE support at S x 3 mins 10/13/21    Time 4    Period Weeks    Status Revised      PT SHORT TERM GOAL #4   Title Pt will perform standing at sink/parallel bars, x 2 minutes, with mod assist, for improved participation in ADLs.    Baseline Did not perform as not appropriate at this time    Time 4    Period Weeks    Status On-going               PT Long Term Goals - 09/17/21 1328       PT LONG TERM GOAL #1   Title Pt will perform progression of HEP with family supervision for improved strength, balance, transfers, and posture.  TARGET 12/10/2021    Time 12    Period Weeks    Status New      PT LONG TERM GOAL #2   Title Pt will perform stand pivot transfer w/c<>mat with min assist for improved mobility, decreased caregiver burden.    Time 12    Period Weeks    Status New      PT LONG TERM GOAL #3   Title Pt will perform at least 5 minutes of standing at sink, with min  assist, for improved participation in ADLs.    Time 12    Period Weeks    Status New      PT LONG TERM GOAL #4   Title Further gait assessment as strength and standing tolerance progresses, with goal to be written as appropriate.                   Plan - 10/28/21 1151     Clinical Impression Statement Due to time constraints, session limited to postural training, PROM and proprioception tasks in sitting.  discussed possible upcoming trip to [facility] and offered suggestions regarding airline accomodations/concerns if they decide to fly.  possibility they may drive to [place] to stay with family and break up trip    Personal Factors and Comorbidities Comorbidity 2    Comorbidities Significant PMH: memory deficits, OSA.    Examination-Activity Limitations Locomotion Level;Transfers;Sit;Stand;Dressing;Toileting;Hygiene/Grooming;Bed Mobility;Bathing    Examination-Participation Restrictions Occupation;Community Activity;Church    Stability/Clinical Decision Making Evolving/Moderate complexity    Rehab Potential Fair    PT Frequency 2x / week    PT Duration 12 weeks   plus eval   PT Treatment/Interventions ADLs/Self Care Home Management;Functional mobility training;Therapeutic activities;Therapeutic exercise;Balance training;Patient/family education;Orthotic Fit/Training;Wheelchair mobility training;Manual techniques;Passive range of motion;Neuromuscular re-education    PT Next Visit Plan Continue to work on sitting on [place] bench (legs abd on either side) for ant pelvic tilt, trunk dissociation, lying on R side to reduce pusher tendencies    PT Home Exercise Plan sitting balance WS tasks demoed by PT to CG    Consulted and Agree with Plan of Care Patient;Family member/caregiver             Patient will benefit from skilled therapeutic intervention in order to improve the following deficits and impairments:  Decreased range of motion, Impaired tone, Difficulty walking, Decreased activity  tolerance, Decreased balance, Impaired flexibility, Decreased mobility, Decreased strength, Postural dysfunction  Visit Diagnosis: 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)  Muscle weakness (generalized)  Unsteadiness on feet     Problem List Patient Active Problem List   Diagnosis Date Noted   At  high risk for inadequate nutritional intake 07/12/2021   Transaminitis 06/06/2021   Intraparenchymal hematoma of brain 06/04/2021   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HCC) 05/30/2021   Right lower lobe pneumonia 05/30/2021   Prediabetes 05/30/2021   Hyperglycemia 05/30/2021   Intracerebral hemorrhage 05/24/2021   Seizure (HCC) 05/23/2021   Malnutrition of moderate degree 05/22/2021   Memory deficits    OSA (obstructive sleep apnea)    Generalized OA    Hypokalemia    Leukocytosis    Annual physical exam 04/03/2017    Hildred Laser, PT 10/28/2021, 2:05 PM  [facility] [address] [place], [place], [address] Phone: [phone]   Fax:  [phone]  Name: Donald [name] MRN: [id number] Date of Birth: 02-14-1956

## 2021-11-02 ENCOUNTER — Ambulatory Visit: Payer: No Typology Code available for payment source

## 2021-11-02 ENCOUNTER — Other Ambulatory Visit: Payer: Self-pay

## 2021-11-02 ENCOUNTER — Ambulatory Visit: Payer: No Typology Code available for payment source | Admitting: Occupational Therapy

## 2021-11-02 ENCOUNTER — Encounter: Payer: Self-pay | Admitting: Occupational Therapy

## 2021-11-02 VITALS — BP 121/100 | HR 92

## 2021-11-02 DIAGNOSIS — M6281 Muscle weakness (generalized): Secondary | ICD-10-CM

## 2021-11-02 DIAGNOSIS — M25632 Stiffness of left wrist, not elsewhere classified: Secondary | ICD-10-CM

## 2021-11-02 DIAGNOSIS — M25612 Stiffness of left shoulder, not elsewhere classified: Secondary | ICD-10-CM

## 2021-11-02 DIAGNOSIS — R4701 Aphasia: Secondary | ICD-10-CM

## 2021-11-02 DIAGNOSIS — R293 Abnormal posture: Secondary | ICD-10-CM

## 2021-11-02 DIAGNOSIS — I69154 Hemiplegia and hemiparesis following nontraumatic intracerebral hemorrhage affecting left non-dominant side: Secondary | ICD-10-CM | POA: Diagnosis not present

## 2021-11-02 DIAGNOSIS — R471 Dysarthria and anarthria: Secondary | ICD-10-CM

## 2021-11-02 DIAGNOSIS — R278 Other lack of coordination: Secondary | ICD-10-CM

## 2021-11-02 DIAGNOSIS — R2681 Unsteadiness on feet: Secondary | ICD-10-CM

## 2021-11-02 DIAGNOSIS — M25622 Stiffness of left elbow, not elsewhere classified: Secondary | ICD-10-CM

## 2021-11-02 DIAGNOSIS — R41841 Cognitive communication deficit: Secondary | ICD-10-CM

## 2021-11-02 NOTE — Therapy (Signed)
[facility] [address] [place], [place], [address] Phone: [phone]   Fax:  [phone]  Occupational Therapy Treatment and Progress Update  Patient Details  Name: Donald [name] MRN: [id number] Date of Birth: 04/09/1956 Referring Provider (OT): Dr. Naaman Plummer  This progress report covers dates of service from 09/16/21-11/02/21.    Encounter Date: 11/02/2021   OT End of Session - 11/02/21 1440     Visit Number 9    Number of Visits 25    Date for OT Re-Evaluation 12/09/21    Authorization Type [employer]    Authorization Time Period goals written for 12 weeks, pt scheduled only for 6 week trial at this time    OT Start Time 1233    OT Stop Time 1315    OT Time Calculation (min) 42 min    Activity Tolerance Patient tolerated treatment well    Behavior During Therapy WFL for tasks assessed/performed             Past Medical History:  Diagnosis Date   Arthritis     Past Surgical History:  Procedure Laterality Date   APPENDECTOMY     IR ANGIO INTRA EXTRACRAN SEL COM CAROTID INNOMINATE BILAT MOD SED  05/28/2021   IR ANGIO VERTEBRAL SEL VERTEBRAL BILAT MOD SED  05/28/2021   IR IVC FILTER PLMT / S&I /IMG GUID/MOD SED  05/27/2021   IR US GUIDE VASC ACCESS RIGHT  05/27/2021    Vitals:   11/02/21 1238  BP: (!) 121/100  Pulse: 92     Subjective Assessment - 11/02/21 1238     Subjective  This is my favorite place    Patient is accompanied by: Family member    Pertinent History 65 y.o. male hospitlized with  ICH R frontal lobe and small adjacent  SAH, pt transferred to rehab then he exeperienced worsening midline shift of R MCA, R ACA and DVT in RLE    Limitations hx of seizure, no estim without MD clearance, hx of memory deficit OSA    Currently in Pain? No/denies    Pain Score 0-No pain                          OT Treatments/Exercises (OP) - 11/02/21 1434       ADLs   Functional Mobility  Neuromuscular re-education to address postural control.  In seated with feet on floor - worked on reaching forward with RUE to obtain colored targets.  Patient needed mod cueing to remain on task, and follow instructions.  Did well with praise, and frequent brief rest breaks from cognitive challenge.  Worked on weight shifting over base of support.  Feet on floor, having pelvis rotate forward with functional reach.  Patient frowning when LLE repositioned, although reports no pain.  Patient with balance response with faster attempts to move forward - did better with slow, sustained work for forward and left weight shift.  Facilitated turning head, visually attend to left of midline - with overt cueing - visual, auditory,  Patient able to ID primary colors with 75% accuracy.  Did best with more immediate recall of instruction - if delay - patient lost instruction and needed new directive.    ADL Comments Reviewed short term goals with patient and wife.  Patient has met some of the short term goals, and spoke with wife on areas where he could continue to participate even more with basic ADL, and awareness of Left  Splinting   Splinting Mended finger strap on left resting hand splint.                      OT Short Term Goals - 11/02/21 1243       OT SHORT TERM GOAL #1   Title Pt's family with be I with inital HEP.    Time 6    Period Weeks    Status Achieved    Target Date 10/28/21      OT SHORT TERM GOAL #2   Title Pt's family with be I with splint wear, care and precautions and LUE positioning to minimize pain and contracture.    Time 6    Period Weeks    Status Achieved      OT SHORT TERM GOAL #3   Title Pt will consistently wash face and chest with no more than min A.    Time 6    Period Weeks    Status Partially Met   per wife - washing face not chest     OT SHORT TERM GOAL #4   Title Pt will attend to left hemibody space at least 10% of the time with no more than mod  v.c    Time 6    Period Weeks    Status Achieved      OT SHORT TERM GOAL #5   Title Pt will assist with pulling down shirt in front at least 25% of them with min v.c    Time 6    Period Weeks    Status Partially Met      OT SHORT TERM GOAL #6   Title Pt will follow a simple  1 step command 75% of the time during therapy.    Time 6    Period Weeks    Status Partially Met   follows 1 step commands inconsistently              OT Long Term Goals - 09/16/21 1513       OT LONG TERM GOAL #1   Title Pt will donn shirt with mod A, and mod v.c    Time 12    Period Weeks    Status New      OT LONG TERM GOAL #2   Title Pt will attend to L hemibody space during ADLs and functional activities at least 25% of the time with min v.c    Time 12    Period Weeks    Status New      OT LONG TERM GOAL #3   Title Pt will perfom UB bathing with min A    Time 12    Period Weeks    Status New      OT LONG TERM GOAL #4   Title Pt will attend to a simple ADL or functional task for at least 15 mins with no more than min v.c    Time 12    Period Weeks    Status New      OT LONG TERM GOAL #5   Title Pt will tolerate P/ROM shoulder flexion to 90* with pain no greater than 2/10 in prep for dressing.    Time 12    Period Weeks    Status New      Long Term Additional Goals   Additional Long Term Goals Yes      OT LONG TERM GOAL #6   Title Pt will perform toilet transfers with mod  A.    Time 12    Period Weeks    Status New                   Plan - 11/02/21 1440     Clinical Impression Statement Pt has met some of his short term goals.  Wife reports improved participation with ADL.    OT Occupational Profile and History Detailed Assessment- Review of Records and additional review of physical, cognitive, psychosocial history related to current functional performance    Occupational performance deficits (Please refer to evaluation for details): ADL's;IADL's;Rest and  Sleep;Play;Leisure;Social Participation    Body Structure / Function / Physical Skills ADL;UE functional use;Flexibility;Pain;Vision;FMC;Proprioception;ROM;Gait;Coordination;GMC;Sensation;Decreased knowledge of precautions;Decreased knowledge of use of DME;Dexterity;Mobility;Tone;Strength;IADL    Cognitive Skills Attention;Energy/Drive;Memory;Orientation;Perception;Problem Solve;Safety Awareness;Sequencing;Thought;Understand    Rehab Potential Good    Clinical Decision Making Several treatment options, min-mod task modification necessary    Comorbidities Affecting Occupational Performance: May have comorbidities impacting occupational performance    Modification or Assistance to Complete Evaluation  Min-Moderate modification of tasks or assist with assess necessary to complete eval    OT Frequency 2x / week   plus eval   OT Duration 12 weeks    OT Treatment/Interventions Self-care/ADL training;Ultrasound;Visual/perceptual remediation/compensation;Patient/family education;Balance training;Passive range of motion;Gait Training;Paraffin;Cryotherapy;Fluidtherapy;Splinting;Therapist, nutritional;Contrast Bath;Moist Heat;Therapeutic exercise;Manual Therapy;Therapeutic activities;Cognitive remediation/compensation;Neuromuscular education    Plan continue NMR - Postural control, weight shifting forward and left    Consulted and Agree with Plan of Care Patient;Family member/caregiver             Patient will benefit from skilled therapeutic intervention in order to improve the following deficits and impairments:   Body Structure / Function / Physical Skills: ADL, UE functional use, Flexibility, Pain, Vision, FMC, Proprioception, ROM, Gait, Coordination, GMC, Sensation, Decreased knowledge of precautions, Decreased knowledge of use of DME, Dexterity, Mobility, Tone, Strength, IADL Cognitive Skills: Attention, Energy/Drive, Memory, Orientation, Perception, Problem Solve, Safety Awareness, Sequencing,  Thought, Understand     Visit Diagnosis: 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)  Muscle weakness (generalized)  Unsteadiness on feet  Abnormal posture  Stiffness of left wrist, not elsewhere classified  Other lack of coordination  Stiffness of left shoulder, not elsewhere classified  Stiffness of left elbow, not elsewhere classified    Problem List Patient Active Problem List   Diagnosis Date Noted   At high risk for inadequate nutritional intake 07/12/2021   Transaminitis 06/06/2021   Intraparenchymal hematoma of brain 06/04/2021   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[place]) 05/30/2021   Right lower lobe pneumonia 05/30/2021   Prediabetes 05/30/2021   Hyperglycemia 05/30/2021   Intracerebral hemorrhage 05/24/2021   Seizure ([place]) 05/23/2021   Malnutrition of moderate degree 05/22/2021   Memory deficits    OSA (obstructive sleep apnea)    Generalized OA    Hypokalemia    Leukocytosis    Annual physical exam 04/03/2017    Mariah Milling, OT/L 11/02/2021, 2:42 PM  [facility] [address] [place], [place], [address] Phone: [phone]   Fax:  [phone]  Name: Donald [name] MRN: [id number] Date of Birth: 20-Sep-1956

## 2021-11-02 NOTE — Therapy (Signed)
[facility] [address] [place], [place], [address] Phone: [phone]   Fax:  [phone]  Physical Therapy Treatment  Patient Details  Name: Donald [name] MRN: [id number] Date of Birth: 02-26-1956 Referring Provider (PT): Faith Rogue   Encounter Date: 11/02/2021   PT End of Session - 11/02/21 1124     Visit Number 9    Number of Visits 25    Date for PT Re-Evaluation 12/10/21    Authorization Type [employer]-front office trying to contact insurance company for VL information    Progress Note Due on Visit 10    PT Start Time 1100    PT Stop Time 1140    PT Time Calculation (min) 40 min    Equipment Utilized During Treatment Gait belt    Activity Tolerance Patient limited by lethargy;Patient limited by fatigue    Behavior During Therapy Flat affect             Past Medical History:  Diagnosis Date   Arthritis     Past Surgical History:  Procedure Laterality Date   APPENDECTOMY     IR ANGIO INTRA EXTRACRAN SEL COM CAROTID INNOMINATE BILAT MOD SED  05/28/2021   IR ANGIO VERTEBRAL SEL VERTEBRAL BILAT MOD SED  05/28/2021   IR IVC FILTER PLMT / S&I /IMG GUID/MOD SED  05/27/2021   IR US GUIDE VASC ACCESS RIGHT  05/27/2021    There were no vitals filed for this visit.   Subjective Assessment - 11/02/21 1114     Subjective No changes to note.  Family has been using Stedy at home for standing tolerance.  They have decided to drive to [place] on 13/2    Patient is accompained by: Family member    Pertinent History See problem list for PMH    Patient Stated Goals Wife's goal:  get back him to some form of mobility to walk with cane or walker.    Pain Onset More than a month ago            Sitting balance tasks of unsupported sitting in WC, weight shifting forward in anticipation of transfers followed by STS transfers requiring ModAx1 with patient able to maintain standing  with MinAx1 for 60s.  Verbal and tactile cuing issued to correct posture once standing and maintain forward head and gaze while extending through hips.   [facility] Adult PT Treatment/Exercise - 11/02/21 0001       Transfers   Transfers Sit to Stand;Stand to Sit    Sit to Stand 3: Mod assist    Sit to Stand Details Manual facilitation for weight shifting;Manual facilitation for placement;Verbal cues for sequencing;Verbal cues for technique    Stand to Sit 3: Mod assist    Stand to Sit Details (indicate cue type and reason) Verbal cues for sequencing;Verbal cues for technique;Manual facilitation for weight shifting;Manual facilitation for placement      Knee/Hip Exercises: Stretches   Passive Hamstring Stretch Left;1 rep;Limitations    Passive Hamstring Stretch Limitations 2 min static stretch      Knee/Hip Exercises: Seated   Long Arc Quad Strengthening;Right;1 set;10 reps;Limitations    Long Arc Quad Limitations for HEP    Heel Slides Right;1 set;10 reps;Limitations    Heel Slides Limitations for HEP      Ankle Exercises: Seated   Toe Raise 10 reps  PT Education - 11/02/21 1137     Education Details Basic HEP established and issued to CG following PT demo    Person(s) Educated Patient;Spouse    Methods Explanation;Demonstration;Handout    Comprehension Verbalized understanding;Verbal cues required;Tactile cues required;Need further instruction              PT Short Term Goals - 11/02/21 1125       PT SHORT TERM GOAL #1   Title Pt will perform HEP with family supervision for improved strength, balance, transfers, and posture for improved mobility.  TARGET 11/10/2021    Baseline Pts wife they are performing stretching at home; 11/02/21 Family has been working on LE exercises, [id number]    Time 4    Period Weeks    Status On-going      PT SHORT TERM GOAL #2   Title Pt will perform squat/stand pivot transfer w/c<>mat with mod assist for decreased  caregiver burden.    Baseline max assist 10/13/21; 11/02/21 Patient requires max assist to stand due to L side tone    Time 4    Period Weeks    Status On-going      PT SHORT TERM GOAL #3   Title Pt will sit edge of mat, with intermittent RUE support and supervision at least 5 minutes, for improved participation with ADLs.    Baseline Pt was able to sit at EOB with RUE support at S x 3 mins 10/13/21; 11/02/21 Patient able to sit unsupported for >5 min to allow active participation in PT    Time 4    Period Weeks    Status Revised      PT SHORT TERM GOAL #4   Title Pt will perform standing at sink/parallel bars, x 2 minutes, with mod assist, for improved participation in ADLs.    Baseline Did not perform as not appropriate at this time    Time 4    Period Weeks    Status On-going               PT Long Term Goals - 09/17/21 1328       PT LONG TERM GOAL #1   Title Pt will perform progression of HEP with family supervision for improved strength, balance, transfers, and posture.  TARGET 12/10/2021    Time 12    Period Weeks    Status New      PT LONG TERM GOAL #2   Title Pt will perform stand pivot transfer w/c<>mat with min assist for improved mobility, decreased caregiver burden.    Time 12    Period Weeks    Status New      PT LONG TERM GOAL #3   Title Pt will perform at least 5 minutes of standing at sink, with min assist, for improved participation in ADLs.    Time 12    Period Weeks    Status New      PT LONG TERM GOAL #4   Title Further gait assessment as strength and standing tolerance progresses, with goal to be written as appropriate.                   Plan - 11/02/21 1138     Clinical Impression Statement Focus on seated exercises from [facility], HEP issued as handout and demonstrated to CG.  Patient requiring less assist to perform standing and remain standing today.  Appeared more alert/engaged in session today.    Comorbidities Significant PMH: memory  deficits, OSA.  Examination-Activity Limitations Locomotion Level;Transfers;Sit;Stand;Dressing;Toileting;Hygiene/Grooming;Bed Mobility;Bathing    Examination-Participation Restrictions Occupation;Community Activity;Church    Stability/Clinical Decision Making Evolving/Moderate complexity    Rehab Potential Fair    PT Frequency 2x / week    PT Duration 12 weeks    PT Treatment/Interventions ADLs/Self Care Home Management;Functional mobility training;Therapeutic activities;Therapeutic exercise;Balance training;Patient/family education;Orthotic Fit/Training;Wheelchair mobility training;Manual techniques;Passive range of motion;Neuromuscular re-education    PT Next Visit Plan Continue to work on sitting on O'Donnell bench (legs abd on either side) for ant pelvic tilt, trunk dissociation, lying on R side to reduce pusher tendencies, 10th visit progress note    PT Home Exercise Plan Access Code: [email or web address]    Consulted and Agree with Plan of Care Patient;Family member/caregiver             Patient will benefit from skilled therapeutic intervention in order to improve the following deficits and impairments:  Decreased range of motion, Impaired tone, Difficulty walking, Decreased activity tolerance, Decreased balance, Impaired flexibility, Decreased mobility, Decreased strength, Postural dysfunction  Visit Diagnosis: 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)  Muscle weakness (generalized)  Unsteadiness on feet     Problem List Patient Active Problem List   Diagnosis Date Noted   At high risk for inadequate nutritional intake 07/12/2021   Transaminitis 06/06/2021   Intraparenchymal hematoma of brain 06/04/2021   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HCC) 05/30/2021   Right lower lobe pneumonia 05/30/2021   Prediabetes 05/30/2021   Hyperglycemia 05/30/2021   Intracerebral hemorrhage 05/24/2021   Seizure (HCC) 05/23/2021    Malnutrition of moderate degree 05/22/2021   Memory deficits    OSA (obstructive sleep apnea)    Generalized OA    Hypokalemia    Leukocytosis    Annual physical exam 04/03/2017    Hildred Laser, PT 11/02/2021, 1:49 PM  [facility] [address] [place], [place], [address] Phone: [phone]   Fax:  [phone]  Name: Donald [name] MRN: [id number] Date of Birth: 1956-06-25

## 2021-11-02 NOTE — Patient Instructions (Signed)
Access Code: [email or web address] URL: [email or web address] Date: 11/02/2021 Prepared by: Gustavus Bryant  Exercises Supine Lower Trunk Rotation - 2 x daily - 7 x weekly - 1 sets - 3 reps - 30s hold Seated Long Arc Quad - 2 x daily - 7 x weekly - 3 sets - 10 reps Seated Knee Flexion Extension AROM - 2 x daily - 7 x weekly - 3 sets - 10 reps Seated Toe Raise - 2 x daily - 7 x weekly - 3 sets - 10 reps

## 2021-11-03 NOTE — Therapy (Signed)
[facility] [address] [place], [place], [address] Phone: [phone]   Fax:  [phone]  Speech Language Pathology Treatment  Patient Details  Name: Donald [name] MRN: [id number] Date of Birth: [birth year] Referring Provider (SLP): Ranelle Oyster, MD   Encounter Date: 11/02/2021   End of Session - 11/02/21 1146     Visit Number 7    Number of Visits 17    Date for SLP Re-Evaluation 11/12/21    Authorization Type UHC    SLP Start Time 1145    SLP Stop Time  1230    SLP Time Calculation (min) 45 min    Activity Tolerance Patient limited by lethargy;Patient limited by fatigue             Past Medical History:  Diagnosis Date   Arthritis     Past Surgical History:  Procedure Laterality Date   APPENDECTOMY     IR ANGIO INTRA EXTRACRAN SEL COM CAROTID INNOMINATE BILAT MOD SED  05/28/2021   IR ANGIO VERTEBRAL SEL VERTEBRAL BILAT MOD SED  05/28/2021   IR IVC FILTER PLMT / S&I /IMG GUID/MOD SED  05/27/2021   IR US GUIDE VASC ACCESS RIGHT  05/27/2021    There were no vitals filed for this visit.   Subjective Assessment - 11/02/21 1146     Subjective "a little exercise"    Patient is accompained by: Family member    Currently in Pain? No/denies                   ADULT SLP TREATMENT - 11/02/21 1145       General Information   Behavior/Cognition Doesn't follow directions;Requires cueing;Distractible;Confused      Treatment Provided   Treatment provided Cognitive-Linquistic      Cognitive-Linquistic Treatment   Treatment focused on Cognition;Aphasia;Dysarthria    Skilled Treatment Occasional increasing to usual mod verbal and visual cues required to establish and briefly maintain eye contact today. SLP trialed loud /a/, in which pt able to demo 2 consecutive trials with average of 85 dB. Pt unable to follow additional directives to continue loud /a/ trials despite SLP modeling,  simplification of language, and cues. Pt requested to write at home, with usual incomprehensible writing exhibited. Pt's wife reported a few words were directly correlated from television show pt was listening to as pt was verbally answering some of the characters on tv. Pt's wife has trialed cognitive tasks including sorting change and cards, in which she reported pt need less items (trialed 4, needed 2). SLP trialed simple yes/no questions, in which pt was 50% accurate. Pt noted to become increasing confused as questions progressed. Pt answered biographical/personally relevant questions with 38% accuracy.      Assessment / Recommendations / Plan   Plan Continue with current plan of care      Progression Toward Goals   Progression toward goals Not progressing toward goals (comment)   severity of deficits             SLP Education - 11/03/21 0956     Education Details cognitive task modifications, use of simple language    Person(s) Educated Patient;Spouse    Methods Explanation;Demonstration;Handout    Comprehension Verbalized understanding;Need further instruction              SLP Short Term Goals - 11/02/21 1210       SLP SHORT TERM GOAL #1   Title Pt will establish and maintain eye contact during auditory  instructions given occasional mod A to aid focused attention over 3 sessions    Time 1    Period Weeks    Status On-going   ongoing   Target Date --      SLP SHORT TERM GOAL #2   Title Pt will comprehend and demonstrate 1-step directions with 80% accuracy given no more than 3 repetitions over 3 sessions    Baseline 11-02-21    Time 1    Period Weeks    Status On-going   ongoing   Target Date --      SLP SHORT TERM GOAL #3   Title Pt will maintain topic to related question for 30+ seconds given occasional mod A over 3 sessions    Time 1    Period Weeks    Status On-going   ongoing   Target Date --      SLP SHORT TERM GOAL #4   Title Pt will use multimodal  communication to aid pt's comprehension and attention for 2 functional scenarios    Time 1    Period Weeks    Status On-going   ongoing   Target Date --      SLP SHORT TERM GOAL #5   Title Pt will comprehend simple yes/no questions with 90% accuracy with 2 or less repetitions over 2 sessions    Time 1    Period Weeks    Status On-going   ongoing   Target Date --              SLP Long Term Goals - 11/03/21 0959       SLP LONG TERM GOAL #1   Title Pt will comprehend and demonstrate functional 2-step directions with 80% accuracy given no more than 3 repetitions over 3 sessions    Time 5    Period Weeks    Status On-going      SLP LONG TERM GOAL #2   Title Pt will participate in 1 minute conversation of personally relevant topics with no overt shifts in topic over 3 sessions    Time 5    Period Weeks    Status On-going      SLP LONG TERM GOAL #3   Title Pt will comprehend functional mod complex yes/no questions with 80% accuracy with 2 or less repetitions over 2 sessions    Time 5    Period Weeks    Status On-going      SLP LONG TERM GOAL #4   Title Pt's wife will report increased comprehension and participation in daily activities with use of learned compensations by last ST session    Time 5    Period Weeks    Status On-going      SLP LONG TERM GOAL #5   Title Pt will demo improved speech intelligilbity in simple conversation given occasional mod A over 2 sessions    Time 5    Period Weeks    Status New              Plan - 11/03/21 0957     Clinical Impression Statement Donald [name] continues to present with severe cognitive communciation impairments, 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 Reviewed recommendations for dysarthria HEP requiring max A. SLP provided communication strateiges to assist with patient comprehension and topic maintenance. Sustained attention, inititation, processing with max A. Pt required short, simple 1-2 word  instructions with repetition and rephrasing. Occasional increasing to max A for eye contact, hand off of  his head. Ongoing spouse education re: compensations for cognition and communication required. Limited progress exhibited thus far. Continue skilled ST to maximize cognition, comprehension and intelligilbity for safety and to reduce caregiver burden.    Speech Therapy Frequency 2x / week    Duration 8 weeks   6 weeks trial threapy   Treatment/Interventions Compensatory strategies;Functional tasks;Patient/family education;Multimodal communcation approach;Compensatory techniques;Internal/external aids;SLP instruction and feedback    Potential to Achieve Goals Fair    Potential Considerations Severity of impairments;Previous level of function;Ability to learn/carryover information;Cooperation/participation level;Medical prognosis    Consulted and Agree with Plan of Care Patient;Family member/caregiver             Patient will benefit from skilled therapeutic intervention in order to improve the following deficits and impairments:   Cognitive communication deficit  Dysarthria and anarthria  Aphasia    Problem List Patient Active Problem List   Diagnosis Date Noted   At high risk for inadequate nutritional intake 07/12/2021   Transaminitis 06/06/2021   Intraparenchymal hematoma of brain 06/04/2021   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HCC) 05/30/2021   Right lower lobe pneumonia 05/30/2021   Prediabetes 05/30/2021   Hyperglycemia 05/30/2021   Intracerebral hemorrhage 05/24/2021   Seizure (HCC) 05/23/2021   Malnutrition of moderate degree 05/22/2021   Memory deficits    OSA (obstructive sleep apnea)    Generalized OA    Hypokalemia    Leukocytosis    Annual physical exam 04/03/2017    Janann Colonel, CCC-SLP 11/03/2021, 10:01 AM  [facility] [address] [place], [place], [address] Phone: [phone]   Fax:   [phone]   Name: Donald [name] MRN: [id number] Date of Birth: 19-Mar-1956

## 2021-11-10 ENCOUNTER — Encounter: Payer: Self-pay | Admitting: Physical Medicine & Rehabilitation

## 2021-11-10 ENCOUNTER — Encounter
Payer: No Typology Code available for payment source | Attending: Physical Medicine & Rehabilitation | Admitting: Physical Medicine & Rehabilitation

## 2021-11-10 ENCOUNTER — Other Ambulatory Visit: Payer: Self-pay

## 2021-11-10 VITALS — BP 122/77 | HR 91 | Temp 98.6°F

## 2021-11-10 DIAGNOSIS — R413 Other amnesia: Secondary | ICD-10-CM | POA: Insufficient documentation

## 2021-11-10 DIAGNOSIS — S06310S Contusion and laceration of right cerebrum without loss of consciousness, sequela: Secondary | ICD-10-CM | POA: Insufficient documentation

## 2021-11-10 NOTE — Patient Instructions (Signed)
PLEASE FEEL FREE TO CALL OUR OFFICE WITH ANY PROBLEMS OR QUESTIONS ([phone])      

## 2021-11-10 NOTE — Progress Notes (Signed)
Subjective:    Patient ID: Donald [name], male    DOB: 1956-09-12, 65 y.o.   MRN: [id number]  HPI  Mr. Durr is here in follow up of his ICH and associated deficits. Memory has shown improvement especially in regard to long term memory. Wife states he has had fewer "lapses". Wife states that his behavior has leveled out with less aggression and irritable behavior. He may have periods of depression but they are limitd. He has been more up beat.   His appetite is much improved and he's putting on weight.   He is having minimal pain except perhaps when he has to be moved during hygiene or transfers. The pain is still primarily in his left shoulder and arm. Wife states it has gotten a whole lot better.   His bowels and bladder are regular. He has still been incontinent but has better idea of when he's emptying. He wears a condom cathether.   He is working with [facility] on his basic mobility and spasticity.  At her last visit we performed a mobility evaluation for his custom wheelchair.  He has yet to receive the chair of the his wife is hopeful he will be getting it soon.  His current chair is made trunk control challenge at times----particularly his upper trunk and shoulder/neck.    Pain Inventory Average Pain 1 Pain Right Now 0 My pain is intermittent and tingling  LOCATION OF PAIN  hand, leg  BOWEL Number of stools per week: 8-10 Oral laxative use No  Type of laxative , Enema or suppository use No  History of colostomy No  Incontinent Yes   BLADDER Pads and Foley In and out cath, frequency . Able to self cath  . Bladder incontinence Yes  Frequent urination No  Leakage with coughing No  Difficulty starting stream No  Incomplete bladder emptying No    Mobility walk without assistance walk with assistance use a cane use a walker how many minutes can you walk? Marland Kitchen ability to climb steps?  no do you drive?  no use a wheelchair needs help with  transfers  Function not employed: date last employed 3/22 disabled: date disabled 05/11/21 I need assistance with the following:  dressing, bathing, toileting, meal prep, household duties, and shopping  Neuro/Psych bladder control problems bowel control problems weakness trouble walking confusion  Prior Studies CT/MRI  Physicians involved in your care Primary care Meredith Staggers, MD Neurologist Dr. Pearlean Brownie   Family History  Problem Relation Age of Onset   Hypertension Mother    Colon polyps Neg Hx    Colon cancer Neg Hx    Esophageal cancer Neg Hx    Rectal cancer Neg Hx    Stomach cancer Neg Hx    Social History   Socioeconomic History   Marital status: Married    Spouse name: Not on file   Number of children: Not on file   Years of education: Not on file   Highest education level: Not on file  Occupational History   Not on file  Tobacco Use   Smoking status: Never   Smokeless tobacco: Never  Vaping Use   Vaping Use: Never used  Substance and Sexual Activity   Alcohol use: No   Drug use: No   Sexual activity: Yes    Birth control/protection: None  Other Topics Concern   Not on file  Social History Narrative   Not on file   Social Determinants of Corporate investment banker  Strain: Not on file  Food Insecurity: Not on file  Transportation Needs: Not on file  Physical Activity: Not on file  Stress: Not on file  Social Connections: Not on file   Past Surgical History:  Procedure Laterality Date   APPENDECTOMY     IR ANGIO INTRA EXTRACRAN SEL COM CAROTID INNOMINATE BILAT MOD SED  05/28/2021   IR ANGIO VERTEBRAL SEL VERTEBRAL BILAT MOD SED  05/28/2021   IR IVC FILTER PLMT / S&I /IMG GUID/MOD SED  05/27/2021   IR US GUIDE VASC ACCESS RIGHT  05/27/2021   Past Medical History:  Diagnosis Date   Arthritis    BP 122/77   Pulse 91   Temp 98.6 F (37 C) (Oral)   SpO2 97%   Opioid Risk Score:   Fall Risk Score:  `1  Depression screen PHQ  2/9  Depression screen [facility] 2/9 09/08/2021 08/12/2021 11/09/2020 10/26/2020 09/09/2020 04/27/2020 12/16/2019  Decreased Interest 0 1 0 0 0 0 0  Down, Depressed, Hopeless 0 1 0 0 0 0 0  PHQ - 2 Score 0 2 0 0 0 0 0  Altered sleeping 0 0 - - - - -  Tired, decreased energy 0 1 - - - - -  Change in appetite 0 0 - - - - -  Feeling bad or failure about yourself  0 0 - - - - -  Trouble concentrating 0 1 - - - - -  Moving slowly or fidgety/restless 0 0 - - - - -  Suicidal thoughts - 0 - - - - -  PHQ-9 Score 0 4 - - - - -      Review of Systems  Musculoskeletal:  Positive for gait problem.  Neurological:        Tingling in hand  Psychiatric/Behavioral:  Positive for confusion.   All other systems reviewed and are negative.     Objective:   Physical Exam General: No acute distress HEENT: NCAT, EOMI, oral membranes moist Cards: reg rate  Chest: normal effort Abdomen: Soft, NT, ND Skin: dry, intact Extremities: no edema Psych: pleasant and appropriate  Skin: intact Neuro: oriented to person only. Able to say wife's name with extra time, right gaze preference better.  Left central 7.  Speech is dysarthric but clearer.  Patient able to follow simple one-step commands.  Leans to left in his wheelchair despite being propped up with pillows and neck rest.  Left sternocleidomastoid is very tight (3 out of 4) left biceps and brachioradialis is 2 out of 4.  He also has hamstring tightness 3/4.  Left upper extremity 0 out of 5 left lower extremity 0-5 in regard to motor function.  Patient remains sensitive to touch in the left upper extremity.  less pain with rom but still tender.  Musculoskeletal:  left shoulder tender with PROM/AROM           Assessment & Plan:    1.  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              -continue with Outpt therapies.  Making gains with transfers and ADL's although he still requires quite a bit of  care.  -discussed HEP as well   -Awaiting power wheelchair   -Cognition getting closer to baseline.  He had memory deficits prior to the hemorrhage.  Some his long-term memory is showing signs of improvement.   2.  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 Low back pain, left hemiplegic shoulder, ?HO left hip,/neuropathic pain:     4.  Spastic left hemiparesis Needs appropriate chair and ongoing ROM, HEP, outpt therapies -positioning in bed/chair was discussed today.  Would benefit from a neck roll to help support his head better when sitting. -will set up for botox 400u left biceps, brachioradialis, scm.  Will defer until later to address left hamstrings 5. Nutrition: eating well 6. Bowels and bladder: condom cath, wife continues to work on continence at home     Fifteen minutes of face to face patient care time were spent during this visit. All questions were encouraged and answered.  Follow up with me in 4-6 weeks for Botox injections

## 2021-11-15 ENCOUNTER — Ambulatory Visit: Payer: No Typology Code available for payment source | Admitting: Family Medicine

## 2021-11-23 ENCOUNTER — Ambulatory Visit
Payer: No Typology Code available for payment source | Attending: Physical Medicine & Rehabilitation | Admitting: Rehabilitation

## 2021-11-23 ENCOUNTER — Ambulatory Visit: Payer: No Typology Code available for payment source

## 2021-11-23 ENCOUNTER — Ambulatory Visit: Payer: No Typology Code available for payment source | Admitting: Occupational Therapy

## 2021-11-23 ENCOUNTER — Other Ambulatory Visit: Payer: Self-pay

## 2021-11-23 ENCOUNTER — Encounter: Payer: Self-pay | Admitting: Occupational Therapy

## 2021-11-23 ENCOUNTER — Encounter: Payer: Self-pay | Admitting: Rehabilitation

## 2021-11-23 DIAGNOSIS — R293 Abnormal posture: Secondary | ICD-10-CM | POA: Diagnosis present

## 2021-11-23 DIAGNOSIS — I69154 Hemiplegia and hemiparesis following nontraumatic intracerebral hemorrhage affecting left non-dominant side: Secondary | ICD-10-CM | POA: Insufficient documentation

## 2021-11-23 DIAGNOSIS — M25622 Stiffness of left elbow, not elsewhere classified: Secondary | ICD-10-CM

## 2021-11-23 DIAGNOSIS — M25612 Stiffness of left shoulder, not elsewhere classified: Secondary | ICD-10-CM | POA: Diagnosis present

## 2021-11-23 DIAGNOSIS — R2689 Other abnormalities of gait and mobility: Secondary | ICD-10-CM | POA: Diagnosis present

## 2021-11-23 DIAGNOSIS — M25632 Stiffness of left wrist, not elsewhere classified: Secondary | ICD-10-CM

## 2021-11-23 DIAGNOSIS — M6281 Muscle weakness (generalized): Secondary | ICD-10-CM | POA: Insufficient documentation

## 2021-11-23 DIAGNOSIS — R2681 Unsteadiness on feet: Secondary | ICD-10-CM | POA: Insufficient documentation

## 2021-11-23 DIAGNOSIS — R471 Dysarthria and anarthria: Secondary | ICD-10-CM

## 2021-11-23 DIAGNOSIS — R278 Other lack of coordination: Secondary | ICD-10-CM | POA: Insufficient documentation

## 2021-11-23 DIAGNOSIS — R41841 Cognitive communication deficit: Secondary | ICD-10-CM | POA: Diagnosis present

## 2021-11-23 DIAGNOSIS — R4701 Aphasia: Secondary | ICD-10-CM | POA: Insufficient documentation

## 2021-11-23 NOTE — Therapy (Signed)
[facility] [address] [place], [place], [address] Phone: [phone]   Fax:  [phone]  Occupational Therapy Treatment  Patient Details  Name: Donald [name] MRN: [id number] Date of Birth: 02-22-1956 Referring Provider (OT): Dr. Naaman Plummer   Encounter Date: 11/23/2021   OT End of Session - 11/23/21 1616     Visit Number 10    Number of Visits 25    Date for OT Re-Evaluation 12/09/21    Authorization Type UHC    Authorization Time Period goals written for 12 weeks, pt scheduled only for 6 week trial at this time    OT Start Time 1315    OT Stop Time 1400    OT Time Calculation (min) 45 min    Activity Tolerance Patient tolerated treatment well             Past Medical History:  Diagnosis Date   Arthritis     Past Surgical History:  Procedure Laterality Date   APPENDECTOMY     IR ANGIO INTRA EXTRACRAN SEL COM CAROTID INNOMINATE BILAT MOD SED  05/28/2021   IR ANGIO VERTEBRAL SEL VERTEBRAL BILAT MOD SED  05/28/2021   IR IVC FILTER PLMT / S&I /IMG GUID/MOD SED  05/27/2021   IR US GUIDE VASC ACCESS RIGHT  05/27/2021    There were no vitals filed for this visit.   Subjective Assessment - 11/23/21 1610     Subjective  When am I getting ice cream?    Patient is accompanied by: Family member    Pertinent History 65 y.o. male hospitlized with  ICH R frontal lobe and small adjacent  SAH, pt transferred to rehab then he exeperienced worsening midline shift of R MCA, R ACA and DVT in RLE    Limitations hx of seizure, no estim without MD clearance, hx of memory deficit OSA    Patient Stated Goals be more independent    Currently in Pain? No/denies    Pain Score 0-No pain                          OT Treatments/Exercises (OP) - 11/23/21 0001       Neurological Re-education Exercises   Other Exercises 1 Neuromuscular reeducation to address postural control.  Working to improve patient's ability  to shift weight forward.  Patient limited by active muscle tension with attempts to move forward, and also by musculoskeletal tightness from prolonged seated flexed posture in reclined wheelchair.  Patient with fearful response with any attempts to reposition head, or with right lateral trunk flexion - patient with best response when he could initiate movement.  If assisted to move forward - patient pushed backward.  If coaxed through activity to move forward patient could do so - allowing for signifcant delay for any motor response.  By end of session, patient able to sit with feet on floor, and forearms on raised mat table in front of him.  Patient with significant impairment of sustained attention.  Needs increased time for verbal or motoric response.                      OT Short Term Goals - 11/23/21 1618       OT SHORT TERM GOAL #1   Title Pt's family with be I with inital HEP.    Baseline Min verbal/tactile cues for don/doff    Time 6    Period Weeks  Status Achieved    Target Date 10/28/21      OT SHORT TERM GOAL #2   Title Pt's family with be I with splint wear, care and precautions and LUE positioning to minimize pain and contracture.    Time 6    Period Weeks    Status Achieved      OT SHORT TERM GOAL #3   Title Pt will consistently wash face and chest with no more than min A.    Time 6    Period Weeks    Status Partially Met   per wife - washing face not chest     OT SHORT TERM GOAL #4   Title Pt will attend to left hemibody space at least 10% of the time with no more than mod v.c    Time 6    Period Weeks    Status Achieved      OT SHORT TERM GOAL #5   Title Pt will assist with pulling down shirt in front at least 25% of them with min v.c    Time 6    Period Weeks    Status Partially Met      OT SHORT TERM GOAL #6   Title Pt will follow a simple  1 step command 75% of the time during therapy.    Time 6    Period Weeks    Status Partially Met    follows 1 step commands inconsistently              OT Long Term Goals - 11/23/21 1624       OT LONG TERM GOAL #1   Title Pt will donn shirt with mod A, and mod v.c    Time 12    Period Weeks    Status On-going      OT LONG TERM GOAL #2   Title Pt will attend to L hemibody space during ADLs and functional activities at least 25% of the time with min v.c    Time 12    Period Weeks    Status On-going      OT LONG TERM GOAL #3   Title Pt will perfom UB bathing with min A    Time 12    Period Weeks    Status On-going      OT LONG TERM GOAL #4   Title Pt will attend to a simple ADL or functional task for at least 15 mins with no more than min v.c    Time 12    Period Weeks    Status On-going      OT LONG TERM GOAL #5   Title Pt will tolerate P/ROM shoulder flexion to 90* with pain no greater than 2/10 in prep for dressing.    Time 12    Period Weeks    Status On-going      OT LONG TERM GOAL #6   Title Pt will perform toilet transfers with mod A.    Time 12    Period Weeks    Status On-going                   Plan - 11/23/21 1617     Clinical Impression Statement Pt has not been seen for a few weekes due to vacation with family.  Patient continues to have significant cognitive, and motoric impairments which limit participation in basic self care skills.    OT Occupational Profile and History Detailed Assessment- Review of Records and  additional review of physical, cognitive, psychosocial history related to current functional performance    Occupational performance deficits (Please refer to evaluation for details): ADL's;IADL's;Rest and Sleep;Play;Leisure;Social Participation    Body Structure / Function / Physical Skills ADL;UE functional use;Flexibility;Pain;Vision;FMC;Proprioception;ROM;Gait;Coordination;GMC;Sensation;Decreased knowledge of precautions;Decreased knowledge of use of DME;Dexterity;Mobility;Tone;Strength;IADL    Cognitive Skills  Attention;Energy/Drive;Memory;Orientation;Perception;Problem Solve;Safety Awareness;Sequencing;Thought;Understand    Rehab Potential Good    Clinical Decision Making Several treatment options, min-mod task modification necessary    Comorbidities Affecting Occupational Performance: May have comorbidities impacting occupational performance    Modification or Assistance to Complete Evaluation  Min-Moderate modification of tasks or assist with assess necessary to complete eval    OT Frequency 2x / week   plus eval   OT Duration 12 weeks    OT Treatment/Interventions Self-care/ADL training;Ultrasound;Visual/perceptual remediation/compensation;Patient/family education;Balance training;Passive range of motion;Gait Training;Paraffin;Cryotherapy;Fluidtherapy;Splinting;Therapist, nutritional;Contrast Bath;Moist Heat;Therapeutic exercise;Manual Therapy;Therapeutic activities;Cognitive remediation/compensation;Neuromuscular education    Plan continue NMR - Postural control, weight shifting forward and left    Consulted and Agree with Plan of Care Patient;Family member/caregiver             Patient will benefit from skilled therapeutic intervention in order to improve the following deficits and impairments:   Body Structure / Function / Physical Skills: ADL, UE functional use, Flexibility, Pain, Vision, FMC, Proprioception, ROM, Gait, Coordination, GMC, Sensation, Decreased knowledge of precautions, Decreased knowledge of use of DME, Dexterity, Mobility, Tone, Strength, IADL Cognitive Skills: Attention, Energy/Drive, Memory, Orientation, Perception, Problem Solve, Safety Awareness, Sequencing, Thought, Understand     Visit Diagnosis: 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)  Muscle weakness (generalized)  Unsteadiness on feet  Abnormal posture  Stiffness of left wrist, not elsewhere classified  Other lack of  coordination  Stiffness of left shoulder, not elsewhere classified  Stiffness of left elbow, not elsewhere classified    Problem List Patient Active Problem List   Diagnosis Date Noted   At high risk for inadequate nutritional intake 07/12/2021   Transaminitis 06/06/2021   Intraparenchymal hematoma of brain 06/04/2021   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[place]) 05/30/2021   Right lower lobe pneumonia 05/30/2021   Prediabetes 05/30/2021   Hyperglycemia 05/30/2021   Intracerebral hemorrhage 05/24/2021   Seizure ([place]) 05/23/2021   Malnutrition of moderate degree 05/22/2021   Memory deficits    OSA (obstructive sleep apnea)    Generalized OA    Hypokalemia    Leukocytosis    Annual physical exam 04/03/2017    Mariah Milling, OT 11/23/2021, 4:25 PM  [place] [address] [place], [place], [address] Phone: [phone]   Fax:  [phone]  Name: Donald [name] MRN: [id number] Date of Birth: 1956/02/08

## 2021-11-23 NOTE — Therapy (Signed)
[facility] [address] [place], [place], [address] Phone: [phone]   Fax:  [phone]  Speech Language Pathology Treatment/Recert  Patient Details  Name: Donald [name] MRN: [id number] Date of Birth: 1955/12/24 Referring Provider (SLP): Meredith Staggers, MD   Encounter Date: 11/23/2021   End of Session - 11/23/21 1316     Visit Number 8    Number of Visits 17    Date for SLP Re-Evaluation 12/24/21    Authorization Type UHC    SLP Start Time 0315    SLP Stop Time  9458    SLP Time Calculation (min) 39 min    Activity Tolerance Patient limited by lethargy;Patient limited by fatigue             Past Medical History:  Diagnosis Date   Arthritis     Past Surgical History:  Procedure Laterality Date   APPENDECTOMY     IR ANGIO INTRA EXTRACRAN SEL COM CAROTID INNOMINATE BILAT MOD SED  05/28/2021   IR ANGIO VERTEBRAL SEL VERTEBRAL BILAT MOD SED  05/28/2021   IR IVC FILTER PLMT / S&I /IMG GUID/MOD SED  05/27/2021   IR US GUIDE VASC ACCESS RIGHT  05/27/2021    There were no vitals filed for this visit.   Subjective Assessment - 11/23/21 1957     Subjective "good"    Patient is accompained by: Family member    Currently in Pain? No/denies                   ADULT SLP TREATMENT - 11/23/21 1316       General Information   Behavior/Cognition Doesn't follow directions;Requires cueing;Distractible;Confused      Treatment Provided   Treatment provided Cognitive-Linquistic      Cognitive-Linquistic Treatment   Treatment focused on Cognition;Aphasia;Dysarthria    Skilled Treatment Pt appeared cognitively/physically fatigued following OT/PT. Suboptimal volume and reduced speech intelligibilty exhibited requiring usual repetition. Loud /a/ completed x5 with upper 80s dB achieved. Improved ability to follow instructions exhibited for routine task. SLP assessed recent short term recall, in which pt  did not recall recent family trip despite max questioning cues. Occasional increasing to usual cues required to establish eye contact to aid attention and engagement. Pt continues with limited attention span, requiring usual redirection and prompting. SLP trialed naming for pictures, in which pt able to complete with ~80% accuracy with sustained attention for ~1-2 minute segments. Semantic and first letter cues required to aid naming. SLP suggested use of pictures for daily routine at home to increase comprehension and attention.      Assessment / Recommendations / Plan   Plan Continue with current plan of care;Goals updated      Progression Toward Goals   Progression toward goals Progressing toward goals              SLP Education - 11/23/21 2008     Education Details trial pictures versus words at home    Person(s) Educated Patient;Spouse    Methods Explanation;Demonstration    Comprehension Verbalized understanding;Returned demonstration;Need further instruction              SLP Short Term Goals - 11/23/21 1318       SLP SHORT TERM GOAL #1   Title Pt will establish and maintain eye contact during auditory instructions given occasional mod A to aid focused attention over 3 sessions    Status Partially Met      SLP SHORT TERM GOAL #  2   Title Pt will comprehend and demonstrate 1-step directions with 80% accuracy given no more than 3 repetitions over 3 sessions    Baseline 11-02-21    Status Partially Met      SLP SHORT TERM GOAL #3   Title Pt will maintain topic to related question for 30+ seconds given occasional mod A over 3 sessions    Status Not Met      SLP SHORT TERM GOAL #4   Title Pt will use multimodal communication to aid pt's comprehension and attention for 2 functional scenarios    Status Not Met      SLP SHORT TERM GOAL #5   Title Pt will comprehend simple yes/no questions with 90% accuracy with 2 or less repetitions over 2 sessions    Status Partially  Met              SLP Long Term Goals - 11/23/21 1318       SLP LONG TERM GOAL #1   Title Pt will comprehend and demonstrate functional 2-step directions with 50% accuracy given no more than 3 repetitions over 2 sessions    Time 4    Period Weeks    Status Revised   all LTG ongoing for recert   Target Date 24/09/73      SLP LONG TERM GOAL #2   Title Pt will participate in 30 second to 1 minute conversation of personally relevant topics with no overt shifts in topic over 2 sessions    Time 4    Period Weeks    Status Revised    Target Date 12/24/21      SLP LONG TERM GOAL #3   Title Pt will comprehend functional simple to mod complex yes/no questions with 80% accuracy with 2 or less repetitions over 2 sessions    Time 4    Period Weeks    Status Revised    Target Date 12/24/21      SLP LONG TERM GOAL #4   Title Pt's wife will report increased comprehension and participation in daily activities with use of learned compensations by last ST session    Time 4    Period Weeks    Status On-going    Target Date 12/24/21      SLP LONG TERM GOAL #5   Title Pt will demo improved speech intelligilbity in simple conversation given occasional mod A over 2 sessions    Time 4    Period Weeks    Status On-going    Target Date 12/24/21              Plan - 11/23/21 1316     Clinical Impression Statement Rithik continues to present with severe cognitive communciation impairments, 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 SLP provided communication strateiges to assist with patient comprehension and topic maintenance. Sustained attention, inititation, processing with max A. Pt required short, simple 1-2 word instructions with repetition and rephrasing. Occasional increasing to max A for eye contact, hand off of his head. Ongoing spouse education re: compensations for cognition and communication required. Limited progress exhibited thus far but recommend continue  2x/week for 4 weeks to further address cognitive communication to aid daily functioning. Continue skilled ST to maximize cognition, comprehension and intelligilbity for safety and to reduce caregiver burden.    Speech Therapy Frequency 2x / week    Duration 12 weeks   recert for 4 weeks   Treatment/Interventions Compensatory strategies;Functional tasks;Patient/family education;Multimodal communcation approach;Compensatory  techniques;Internal/external aids;SLP instruction and feedback    Potential to Achieve Goals Fair    Potential Considerations Severity of impairments;Previous level of function;Ability to learn/carryover information;Cooperation/participation level;Medical prognosis    Consulted and Agree with Plan of Care Patient;Family member/caregiver             Patient will benefit from skilled therapeutic intervention in order to improve the following deficits and impairments:   Cognitive communication deficit  Dysarthria and anarthria  Aphasia    Problem List Patient Active Problem List   Diagnosis Date Noted   At high risk for inadequate nutritional intake 07/12/2021   Transaminitis 06/06/2021   Intraparenchymal hematoma of brain 06/04/2021   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[facility]) 05/30/2021   Right lower lobe pneumonia 05/30/2021   Prediabetes 05/30/2021   Hyperglycemia 05/30/2021   Intracerebral hemorrhage 05/24/2021   Seizure ([facility]) 05/23/2021   Malnutrition of moderate degree 05/22/2021   Memory deficits    OSA (obstructive sleep apnea)    Generalized OA    Hypokalemia    Leukocytosis    Annual physical exam 04/03/2017    Alinda Deem, [facility] 11/23/2021, 8:12 PM  [facility] [address] [place], [place], [address] Phone: [phone]   Fax:  [phone]   Name: Dirk Vanaman MRN: [id number] Date of Birth: 10-Mar-1956

## 2021-11-23 NOTE — Therapy (Addendum)
[facility] [address] [place], [place], [address] Phone: [phone]   Fax:  [phone]  Physical Therapy Treatment and Progress Note  Patient Details  Name: Donald [name] MRN: [id number] Date of Birth: 1956-10-03 Referring Provider (PT): Faith Rogue   Encounter Date: 11/23/2021   PT End of Session - 11/23/21 1501     Visit Number 10    Number of Visits 25    Date for PT Re-Evaluation 12/10/21    Authorization Type [employer]-front office trying to contact insurance company for VL information    Progress Note Due on Visit 10    PT Start Time 1231    PT Stop Time 1315    PT Time Calculation (min) 44 min    Equipment Utilized During Treatment Gait belt    Activity Tolerance Patient limited by lethargy;Patient limited by fatigue    Behavior During Therapy Flat affect             Past Medical History:  Diagnosis Date   Arthritis     Past Surgical History:  Procedure Laterality Date   APPENDECTOMY     IR ANGIO INTRA EXTRACRAN SEL COM CAROTID INNOMINATE BILAT MOD SED  05/28/2021   IR ANGIO VERTEBRAL SEL VERTEBRAL BILAT MOD SED  05/28/2021   IR IVC FILTER PLMT / S&I /IMG GUID/MOD SED  05/27/2021   IR US GUIDE VASC ACCESS RIGHT  05/27/2021    There were no vitals filed for this visit.   Subjective Assessment - 11/23/21 1453     Subjective Pts wife reports that they did go to [employer] with family and they transferred him into car, rented tilt in space w/c and performed 2 person transfers without stedy.    Pertinent History See problem list for PMH    Patient Stated Goals Wife's goal:  get back him to some form of mobility to walk with cane or walker.    Currently in Pain? No/denies                        NMR:  Use of OT as +2A during session.  Transferred forward onto [facility] bench facing mat table with +2A.  Once in this position, requires initially  mod/max A fading to close S for sitting balance on bench.  While in this position, worked on forward and R lateral weight shifting to improve anterior pelvic tilt, forward trunk lean, working out of fixed/active postures with reaching tasks and card game during session.  He requires mod A cues at times to attend to task, however when given simple 1 step instruction, he is better able to follow through with task.  Also does well with task when is motivated with completing.    Provided facilitation at trunk/pelvis for upward and forward motion as well removing RUE from surfaces due to pusher tendency and guiding R hand to target.                  PT Education - 11/23/21 1501     Education Details goals of session    Person(s) Educated Patient;Spouse    Methods Explanation    Comprehension Verbalized understanding              PT Short Term Goals - 11/02/21 1125       PT SHORT TERM GOAL #1   Title Pt will perform HEP with family supervision for improved strength, balance, transfers, and  posture for improved mobility.  TARGET 11/10/2021    Baseline Pts wife they are performing stretching at home; 11/02/21 Family has been working on LE exercises, [id number]    Time 4    Period Weeks    Status On-going      PT SHORT TERM GOAL #2   Title Pt will perform squat/stand pivot transfer w/c<>mat with mod assist for decreased caregiver burden.    Baseline max assist 10/13/21; 11/02/21 Patient requires max assist to stand due to L side tone    Time 4    Period Weeks    Status On-going      PT SHORT TERM GOAL #3   Title Pt will sit edge of mat, with intermittent RUE support and supervision at least 5 minutes, for improved participation with ADLs.    Baseline Pt was able to sit at EOB with RUE support at S x 3 mins 10/13/21; 11/02/21 Patient able to sit unsupported for >5 min to allow active participation in PT    Time 4    Period Weeks    Status Revised      PT SHORT TERM GOAL #4    Title Pt will perform standing at sink/parallel bars, x 2 minutes, with mod assist, for improved participation in ADLs.    Baseline Did not perform as not appropriate at this time    Time 4    Period Weeks    Status On-going               PT Long Term Goals - 09/17/21 1328       PT LONG TERM GOAL #1   Title Pt will perform progression of HEP with family supervision for improved strength, balance, transfers, and posture.  TARGET 12/10/2021    Time 12    Period Weeks    Status New      PT LONG TERM GOAL #2   Title Pt will perform stand pivot transfer w/c<>mat with min assist for improved mobility, decreased caregiver burden.    Time 12    Period Weeks    Status New      PT LONG TERM GOAL #3   Title Pt will perform at least 5 minutes of standing at sink, with min assist, for improved participation in ADLs.    Time 12    Period Weeks    Status New      PT LONG TERM GOAL #4   Title Further gait assessment as strength and standing tolerance progresses, with goal to be written as appropriate.               Progress Note Reporting Period 09/16/21 to 11/23/21  See note below for Objective Data and Assessment of Progress/Goals.        Plan - 11/23/21 1502     Clinical Impression Statement Skilled session focused on pt initiating forward trunk lean and improving anterior pelvic tilt to carryover to improved functional mobility and sitting balance, focused and sustained attention while performing reaching task to encourage forward weight shift.  Pt tolerated well during session, however did grimace somewhat when head/neck encouraged to look R.    Personal Factors and Comorbidities Comorbidity 2    Examination-Activity Limitations Locomotion Level;Transfers;Sit;Stand;Dressing;Toileting;Hygiene/Grooming;Bed Mobility;Bathing    Examination-Participation Restrictions Occupation;Community Activity;Church    Stability/Clinical Decision Making Evolving/Moderate complexity     Rehab Potential Fair    PT Frequency 2x / week    PT Duration 12 weeks    PT Treatment/Interventions ADLs/Self Care  Home Management;Functional mobility training;Therapeutic activities;Therapeutic exercise;Balance training;Patient/family education;Orthotic Fit/Training;Wheelchair mobility training;Manual techniques;Passive range of motion;Neuromuscular re-education    PT Next Visit Plan Continue to work on sitting on [place] bench (legs abd on either side) for ant pelvic tilt, trunk dissociation, lying on R side to reduce pusher tendencies             Patient will benefit from skilled therapeutic intervention in order to improve the following deficits and impairments:  Decreased range of motion, Impaired tone, Difficulty walking, Decreased activity tolerance, Decreased balance, Impaired flexibility, Decreased mobility, Decreased strength, Postural dysfunction  Visit Diagnosis: 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)  Muscle weakness (generalized)  Unsteadiness on feet  Abnormal posture     Problem List Patient Active Problem List   Diagnosis Date Noted   At high risk for inadequate nutritional intake 07/12/2021   Transaminitis 06/06/2021   Intraparenchymal hematoma of brain 06/04/2021   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HCC) 05/30/2021   Right lower lobe pneumonia 05/30/2021   Prediabetes 05/30/2021   Hyperglycemia 05/30/2021   Intracerebral hemorrhage 05/24/2021   Seizure (HCC) 05/23/2021   Malnutrition of moderate degree 05/22/2021   Memory deficits    OSA (obstructive sleep apnea)    Generalized OA    Hypokalemia    Leukocytosis    Annual physical exam 04/03/2017    Harriet Butte, PT, MPT [facility] [address] [place], [place], [address] Phone: [phone]   Fax:  [phone] 11/23/21, 3:16 PM   Name: Donald [name] MRN: [id number] Date of Birth:  [birth year]

## 2021-11-24 ENCOUNTER — Ambulatory Visit: Payer: No Typology Code available for payment source | Admitting: Physical Medicine & Rehabilitation

## 2021-11-25 ENCOUNTER — Ambulatory Visit: Payer: No Typology Code available for payment source | Admitting: Occupational Therapy

## 2021-11-25 ENCOUNTER — Encounter: Payer: Self-pay | Admitting: Occupational Therapy

## 2021-11-25 ENCOUNTER — Encounter: Payer: Self-pay | Admitting: Physical Therapy

## 2021-11-25 ENCOUNTER — Ambulatory Visit: Payer: No Typology Code available for payment source

## 2021-11-25 ENCOUNTER — Other Ambulatory Visit: Payer: Self-pay

## 2021-11-25 ENCOUNTER — Ambulatory Visit: Payer: No Typology Code available for payment source | Admitting: Physical Therapy

## 2021-11-25 DIAGNOSIS — R293 Abnormal posture: Secondary | ICD-10-CM

## 2021-11-25 DIAGNOSIS — R4701 Aphasia: Secondary | ICD-10-CM

## 2021-11-25 DIAGNOSIS — M6281 Muscle weakness (generalized): Secondary | ICD-10-CM

## 2021-11-25 DIAGNOSIS — R2681 Unsteadiness on feet: Secondary | ICD-10-CM

## 2021-11-25 DIAGNOSIS — I69154 Hemiplegia and hemiparesis following nontraumatic intracerebral hemorrhage affecting left non-dominant side: Secondary | ICD-10-CM | POA: Diagnosis not present

## 2021-11-25 DIAGNOSIS — M25632 Stiffness of left wrist, not elsewhere classified: Secondary | ICD-10-CM

## 2021-11-25 DIAGNOSIS — M25612 Stiffness of left shoulder, not elsewhere classified: Secondary | ICD-10-CM

## 2021-11-25 DIAGNOSIS — R278 Other lack of coordination: Secondary | ICD-10-CM

## 2021-11-25 DIAGNOSIS — R41841 Cognitive communication deficit: Secondary | ICD-10-CM

## 2021-11-25 DIAGNOSIS — M25622 Stiffness of left elbow, not elsewhere classified: Secondary | ICD-10-CM

## 2021-11-25 NOTE — Therapy (Signed)
[facility] [address] [place], [place], [address] Phone: [phone]   Fax:  [phone]  Occupational Therapy Treatment  Patient Details  Name: Donald [name] MRN: [id number] Date of Birth: [birth year] Referring Provider (OT): Dr. Naaman Plummer   Encounter Date: 11/25/2021   OT End of Session - 11/25/21 1556     Visit Number 11    Number of Visits 25    Date for OT Re-Evaluation 12/09/21    Authorization Time Period goals written for 12 weeks, pt scheduled only for 6 week trial at this time    Progress Note Due on Visit 32    OT Start Time 1400    OT Stop Time 1445    OT Time Calculation (min) 45 min    Activity Tolerance Patient tolerated treatment well    Behavior During Therapy [facility] for tasks assessed/performed             Past Medical History:  Diagnosis Date   Arthritis     Past Surgical History:  Procedure Laterality Date   APPENDECTOMY     IR ANGIO INTRA EXTRACRAN SEL COM CAROTID INNOMINATE BILAT MOD SED  05/28/2021   IR ANGIO VERTEBRAL SEL VERTEBRAL BILAT MOD SED  05/28/2021   IR IVC FILTER PLMT / S&I /IMG GUID/MOD SED  05/27/2021   IR US GUIDE VASC ACCESS RIGHT  05/27/2021    There were no vitals filed for this visit.   Subjective Assessment - 11/25/21 1552     Subjective  I like to take the iniatiative.    Patient is accompanied by: Family member    Pertinent History 65 y.o. male hospitlized with  [place] R frontal lobe and small adjacent  SAH, pt transferred to rehab then he exeperienced worsening midline shift of R MCA, R ACA and DVT in RLE    Limitations hx of seizure, no estim without MD clearance, hx of memory deficit OSA    Patient Stated Goals be more independent    Currently in Pain? No/denies    Pain Score 0-No pain                          OT Treatments/Exercises (OP) - 11/25/21 0001       Neurological Re-education Exercises   Other Exercises 1 Neuromuscular  reeducation to address postural control and activation.  Transferred to stedy lift and worked on partial stand to stand for increasing amounts of time.  Patient able to stand - with hip/knee flexion - limited weight on LLE, to place/retriev items from chest height cupboards - used sorting, counting, etc to maintain longer time in standing.  Patient with best response when task required him to shift forward toward standing versus being assisted to standing - in stedy standing at mirror and drawing on mirror - patient able to achieve greater thoracici extension with drawing task.                    OT Education - 11/25/21 1558     Education Details talked with wife about using stedy to work on more prolonged upright and postural challenges at home - sorting items, reaching into cabinets, (she purchased a Hydrograph[employer] surveyor to work on drawing, Social research officer, government)    Person(s) Educated Patient;Spouse    Methods Explanation;Demonstration    Comprehension Need further instruction;Verbalized understanding              OT Short Term Goals - 11/25/21 1558  OT SHORT TERM GOAL #1   Title Pt's family with be I with inital HEP.    Baseline Min verbal/tactile cues for don/doff    Time 6    Period Weeks    Status Achieved    Target Date 10/28/21      OT SHORT TERM GOAL #2   Title Pt's family with be I with splint wear, care and precautions and LUE positioning to minimize pain and contracture.    Time 6    Period Weeks    Status Achieved      OT SHORT TERM GOAL #3   Title Pt will consistently wash face and chest with no more than min A.    Time 6    Period Weeks    Status Partially Met   per wife - washing face not chest     OT SHORT TERM GOAL #4   Title Pt will attend to left hemibody space at least 10% of the time with no more than mod v.c    Time 6    Period Weeks    Status Achieved      OT SHORT TERM GOAL #5   Title Pt will assist with pulling down shirt in front at least 25% of them with  min v.c    Time 6    Period Weeks    Status Partially Met      OT SHORT TERM GOAL #6   Title Pt will follow a simple  1 step command 75% of the time during therapy.    Time 6    Period Weeks    Status Partially Met   follows 1 step commands inconsistently              OT Long Term Goals - 11/25/21 1558       OT LONG TERM GOAL #1   Title Pt will donn shirt with mod A, and mod v.c    Time 12    Period Weeks    Status On-going      OT LONG TERM GOAL #2   Title Pt will attend to L hemibody space during ADLs and functional activities at least 25% of the time with min v.c    Time 12    Period Weeks    Status On-going      OT LONG TERM GOAL #3   Title Pt will perfom UB bathing with min A    Time 12    Period Weeks    Status On-going      OT LONG TERM GOAL #4   Title Pt will attend to a simple ADL or functional task for at least 15 mins with no more than min v.c    Time 12    Period Weeks    Status On-going      OT LONG TERM GOAL #5   Title Pt will tolerate P/ROM shoulder flexion to 90* with pain no greater than 2/10 in prep for dressing.    Time 12    Period Weeks    Status On-going      OT LONG TERM GOAL #6   Title Pt will perform toilet transfers with mod A.    Time 12    Period Weeks    Status On-going                   Plan - 11/25/21 1557     Clinical Impression Statement Patient with slow progress but showing improved postural  control in unsupported sitting and supported partial standing.  Patient with severe cognitive impairments - and very delayed motor and/or verbal response.  Patient pleasant and cooperative with rehab process.    OT Occupational Profile and History Detailed Assessment- Review of Records and additional review of physical, cognitive, psychosocial history related to current functional performance    Occupational performance deficits (Please refer to evaluation for details): ADL's;IADL's;Rest and Sleep;Play;Leisure;Social  Participation    Body Structure / Function / Physical Skills ADL;UE functional use;Flexibility;Pain;Vision;FMC;Proprioception;ROM;Gait;Coordination;GMC;Sensation;Decreased knowledge of precautions;Decreased knowledge of use of DME;Dexterity;Mobility;Tone;Strength;IADL    Cognitive Skills Attention;Energy/Drive;Memory;Orientation;Perception;Problem Solve;Safety Awareness;Sequencing;Thought;Understand    Rehab Potential Good    Clinical Decision Making Several treatment options, min-mod task modification necessary    Comorbidities Affecting Occupational Performance: May have comorbidities impacting occupational performance    Modification or Assistance to Complete Evaluation  Min-Moderate modification of tasks or assist with assess necessary to complete eval    OT Frequency 2x / week    OT Duration 12 weeks    OT Treatment/Interventions Self-care/ADL training;Ultrasound;Visual/perceptual remediation/compensation;Patient/family education;Balance training;Passive range of motion;Gait Training;Paraffin;Cryotherapy;Fluidtherapy;Splinting;Therapist, nutritional;Contrast Bath;Moist Heat;Therapeutic exercise;Manual Therapy;Therapeutic activities;Cognitive remediation/compensation;Neuromuscular education    Plan continue NMR - Postural control, weight shifting forward and left    Consulted and Agree with Plan of Care Patient;Family member/caregiver             Patient will benefit from skilled therapeutic intervention in order to improve the following deficits and impairments:   Body Structure / Function / Physical Skills: ADL, UE functional use, Flexibility, Pain, Vision, FMC, Proprioception, ROM, Gait, Coordination, GMC, Sensation, Decreased knowledge of precautions, Decreased knowledge of use of DME, Dexterity, Mobility, Tone, Strength, IADL Cognitive Skills: Attention, Energy/Drive, Memory, Orientation, Perception, Problem Solve, Safety Awareness, Sequencing, Thought, Understand     Visit  Diagnosis: 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)  Muscle weakness (generalized)  Unsteadiness on feet  Abnormal posture  Stiffness of left wrist, not elsewhere classified  Other lack of coordination  Stiffness of left shoulder, not elsewhere classified  Stiffness of left elbow, not elsewhere classified    Problem List Patient Active Problem List   Diagnosis Date Noted   At high risk for inadequate nutritional intake 07/12/2021   Transaminitis 06/06/2021   Intraparenchymal hematoma of brain 06/04/2021   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[facility]) 05/30/2021   Right lower lobe pneumonia 05/30/2021   Prediabetes 05/30/2021   Hyperglycemia 05/30/2021   Intracerebral hemorrhage 05/24/2021   Seizure ([facility]) 05/23/2021   Malnutrition of moderate degree 05/22/2021   Memory deficits    OSA (obstructive sleep apnea)    Generalized OA    Hypokalemia    Leukocytosis    Annual physical exam 04/03/2017    Mariah Milling, OT 11/25/2021, 4:00 PM  [facility] [address] [place], [place], [address] Phone: [phone]   Fax:  [phone]  Name: Donald [name] MRN: [id number] Date of Birth: 23-Nov-1956

## 2021-11-25 NOTE — Therapy (Signed)
[facility] [address] [place], [place], [address] Phone: [phone]   Fax:  [phone]  Speech Language Pathology Treatment  Patient Details  Name: Donald [name] MRN: [id number] Date of Birth: 09-01-1956 Referring Provider (SLP): Meredith Staggers, MD   Encounter Date: 11/25/2021   End of Session - 11/25/21 1417     Visit Number 9    Number of Visits 17    Date for SLP Re-Evaluation 12/24/21    Authorization Type U[employer]    SLP Start Time 1450    SLP Stop Time  8177    SLP Time Calculation (min) 45 min    Activity Tolerance Patient limited by lethargy;Patient limited by fatigue             Past Medical History:  Diagnosis Date   Arthritis     Past Surgical History:  Procedure Laterality Date   APPENDECTOMY     IR ANGIO INTRA EXTRACRAN SEL COM CAROTID INNOMINATE BILAT MOD SED  05/28/2021   IR ANGIO VERTEBRAL SEL VERTEBRAL BILAT MOD SED  05/28/2021   IR IVC FILTER PLMT / S&I /IMG GUID/MOD SED  05/27/2021   IR US GUIDE VASC ACCESS RIGHT  05/27/2021    There were no vitals filed for this visit.   Subjective Assessment - 11/25/21 1453     Subjective "fine"    Patient is accompained by: Family member    Currently in Pain? No/denies                   ADULT SLP TREATMENT - 11/25/21 1417       General Information   Behavior/Cognition Doesn't follow directions;Requires cueing;Distractible;Confused      Treatment Provided   Treatment provided Cognitive-Linquistic      Cognitive-Linquistic Treatment   Treatment focused on Cognition;Aphasia;Dysarthria    Skilled Treatment Pt was more engaged today. SLP engaged patient in verbal expression and sequencing task with photo cards x3. Usual max A required to identify important components of picture (ex: water bottle, razor, etc) with pt rarely able to conceptualize what was occuring in whole photo (ex: shaving, uncapping water bottle). SLP trialed  recalling and sequencing 3 photos after already presenting them in order, which was too complex for patient to comprehend and complete.  SLP trialed simple writing, with errors noted for name. Pt able to transcribe numbers x3 with verbal cues. Copying required max verbal and visual cues. SLP provided recommendations for cognitive communication tasks to complete as home.      Assessment / Recommendations / Plan   Plan Continue with current plan of care     Progression Toward Goals   Progression toward goals Progressing toward goals   severity of deficits             SLP Education - 11/25/21 1545     Education Details photo cards    Person(s) Educated Patient;Spouse    Methods Explanation;Demonstration;Verbal cues;Tactile cues    Comprehension Verbalized understanding;Returned demonstration;Verbal cues required;Need further instruction;Tactile cues required              SLP Short Term Goals - 11/23/21 1318       SLP SHORT TERM GOAL #1   Title Pt will establish and maintain eye contact during auditory instructions given occasional mod A to aid focused attention over 3 sessions    Status Partially Met      SLP SHORT TERM GOAL #2   Title Pt will comprehend and demonstrate 1-step  directions with 80% accuracy given no more than 3 repetitions over 3 sessions    Baseline 11-02-21    Status Partially Met      SLP SHORT TERM GOAL #3   Title Pt will maintain topic to related question for 30+ seconds given occasional mod A over 3 sessions    Status Not Met      SLP SHORT TERM GOAL #4   Title Pt will use multimodal communication to aid pt's comprehension and attention for 2 functional scenarios    Status Not Met      SLP SHORT TERM GOAL #5   Title Pt will comprehend simple yes/no questions with 90% accuracy with 2 or less repetitions over 2 sessions    Status Partially Met              SLP Long Term Goals - 11/25/21 1418       SLP LONG TERM GOAL #1   Title Pt will  comprehend and demonstrate functional 2-step directions with 50% accuracy given no more than 3 repetitions over 2 sessions    Time 4    Period Weeks    Status On-going   all LTG ongoing for recert   Target Date 12/24/21      SLP LONG TERM GOAL #2   Title Pt will participate in 30 second to 1 minute conversation of personally relevant topics with no overt shifts in topic over 2 sessions    Time 4    Period Weeks    Status On-going    Target Date 12/24/21      SLP LONG TERM GOAL #3   Title Pt will comprehend functional simple to mod complex yes/no questions with 80% accuracy with 2 or less repetitions over 2 sessions    Time 4    Period Weeks    Status On-going    Target Date 12/24/21      SLP LONG TERM GOAL #4   Title Pt's wife will report increased comprehension and participation in daily activities with use of learned compensations by last ST session    Time 4    Period Weeks    Status On-going    Target Date 12/24/21      SLP LONG TERM GOAL #5   Title Pt will demo improved speech intelligilbity in simple conversation given occasional mod A over 2 sessions    Time 4    Period Weeks    Status On-going              Plan - 11/25/21 1418     Clinical Impression Statement Donald [name] continues to present with severe cognitive communciation impairments, 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 SLP provided communication strateiges to assist with patient comprehension and topic maintenance. Sustained attention, inititation, processing with max A. Pt required short, simple 1-2 word instructions with usual repetition and rephrasing. Occasional eye contact maintained with hand off of his head. Ongoing spouse education re: compensations for cognition and communication required. Continue skilled ST to maximize cognition, comprehension and intelligilbity for safety and to reduce caregiver burden.    Speech Therapy Frequency 2x / week    Duration 12 weeks       Treatment/Interventions Compensatory strategies;Functional tasks;Patient/family education;Multimodal communcation approach;Compensatory techniques;Internal/external aids;SLP instruction and feedback    Potential to Achieve Goals Fair    Potential Considerations Severity of impairments;Previous level of function;Ability to learn/carryover information;Cooperation/participation level;Medical prognosis    Consulted and Agree with Plan of Care Patient;Family member/caregiver  Patient will benefit from skilled therapeutic intervention in order to improve the following deficits and impairments:   Cognitive communication deficit  Aphasia    Problem List Patient Active Problem List   Diagnosis Date Noted   At high risk for inadequate nutritional intake 07/12/2021   Transaminitis 06/06/2021   Intraparenchymal hematoma of brain 06/04/2021   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[place]) 05/30/2021   Right lower lobe pneumonia 05/30/2021   Prediabetes 05/30/2021   Hyperglycemia 05/30/2021   Intracerebral hemorrhage 05/24/2021   Seizure ([place]) 05/23/2021   Malnutrition of moderate degree 05/22/2021   Memory deficits    OSA (obstructive sleep apnea)    Generalized OA    Hypokalemia    Leukocytosis    Annual physical exam 04/03/2017    Alinda Deem, [place] 11/25/2021, 3:47 PM  [facility] [address] [place], [place], [address] Phone: [phone]   Fax:  [phone]   Name: Donald [name] MRN: [id number] Date of Birth: Dec 12, 1956

## 2021-11-26 NOTE — Therapy (Addendum)
[facility] [address] [place], [place], [address] Phone: [phone]   Fax:  [phone]  Physical Therapy Treatment  Patient Details  Name: Donald [name] MRN: [id number] Date of Birth: 1956/04/26 Referring Provider (PT): Faith Rogue   Encounter Date: 11/25/2021   PT End of Session - 11/25/21 1324     Visit Number 11    Number of Visits 25    Date for PT Re-Evaluation 12/10/21    Authorization Type [employer]-front office trying to contact insurance company for VL information    Progress Note Due on Visit 20    PT Start Time 1319    PT Stop Time 1400    PT Time Calculation (min) 41 min    Equipment Utilized During Treatment Gait belt    Activity Tolerance Patient limited by lethargy;Patient limited by fatigue    Behavior During Therapy Flat affect             Past Medical History:  Diagnosis Date   Arthritis     Past Surgical History:  Procedure Laterality Date   APPENDECTOMY     IR ANGIO INTRA EXTRACRAN SEL COM CAROTID INNOMINATE BILAT MOD SED  05/28/2021   IR ANGIO VERTEBRAL SEL VERTEBRAL BILAT MOD SED  05/28/2021   IR IVC FILTER PLMT / S&I /IMG GUID/MOD SED  05/27/2021   IR US GUIDE VASC ACCESS RIGHT  05/27/2021    There were no vitals filed for this visit.   Subjective Assessment - 11/25/21 1322     Subjective No new complaints. No falls or pain to report.    Patient is accompained by: Family member    Pertinent History See problem list for PMH    Patient Stated Goals Wife's goal:  get back him to some form of mobility to walk with cane or walker.    Currently in Pain? No/denies    Pain Score 0-No pain                               [facility] Adult PT Treatment/Exercise - 11/25/21 1325       Transfers   Transfers Sit to Stand;Stand to Sit;Squat Pivot Transfers    Sit to Stand 3: Mod assist with cues/facilitation for increased anterior weight  shifting from wheelchair with UE support on PTA.    Stand to Sit 3: Mod assist for controlled descent with posterior lean noted upon sitting on edge of mat table   Squat Pivot Transfers 2: Max assist with pt keeping knees/hips flexed, cues/facilitation for tall posture. Pt unable to advance feet with transfer from wheelchair to mat table     Neuro Re-ed    Neuro Re-ed Details  for postural re-education/muscle re-ed: initially worked on right side lying to decrease pusher tendencies. Pt placed in supine with mod assist/cues. Once in supine worked on mini bridges to move hips away from edge of mat with max assist/cues. Then mod assist to roll onto right side with cues/facilitation needed at LE's>pelvis>upper trunk. In right side lying- working to initiate movements of left UE with minimal range noted. Then with left LE performed passive heel cord stretching, then hip/knee flexion mostly passive with tone/tightness noted. Returned pt to sitting at edge of bed from right side lying with mod assist. In sitting engaged pt in reaching tasks to promote anterior weight shifting and pelvic tilt. Pt reached toward PTA hand in various spots  with right UE, mostly pt was interested in reaching toward PTA Christmas jingle bell ear rings to make them "jingle". With this in mind PTA moved around to promote reaching in various directions laterally and more forward. With both of pt's hands on PTA shoulders had pt work on "pushing" away and "pulling" in with assist/cues/facilitation needed. Ended session with working on elbow propping on mat table<>short sitting at edge of mat for 5 reps on right with mod assist to get pt to prop on right forearm due to pt tendency to push, min assist to return to sitting due to pushing. Min/mod assist for forearm propping on left side for 5 reps with most assist needed for return to sitting. Pt left in unsupported sitting at edge of mat for OT session with OT.                 PT Short  Term Goals - 11/02/21 1125       PT SHORT TERM GOAL #1   Title Pt will perform HEP with family supervision for improved strength, balance, transfers, and posture for improved mobility.  TARGET 11/10/2021    Baseline Pts wife they are performing stretching at home; 11/02/21 Family has been working on LE exercises, [id number]    Time 4    Period Weeks    Status On-going      PT SHORT TERM GOAL #2   Title Pt will perform squat/stand pivot transfer w/c<>mat with mod assist for decreased caregiver burden.    Baseline max assist 10/13/21; 11/02/21 Patient requires max assist to stand due to L side tone    Time 4    Period Weeks    Status On-going      PT SHORT TERM GOAL #3   Title Pt will sit edge of mat, with intermittent RUE support and supervision at least 5 minutes, for improved participation with ADLs.    Baseline Pt was able to sit at EOB with RUE support at S x 3 mins 10/13/21; 11/02/21 Patient able to sit unsupported for >5 min to allow active participation in PT    Time 4    Period Weeks    Status Revised      PT SHORT TERM GOAL #4   Title Pt will perform standing at sink/parallel bars, x 2 minutes, with mod assist, for improved participation in ADLs.    Baseline Did not perform as not appropriate at this time    Time 4    Period Weeks    Status On-going               PT Long Term Goals - 09/17/21 1328       PT LONG TERM GOAL #1   Title Pt will perform progression of HEP with family supervision for improved strength, balance, transfers, and posture.  TARGET 12/10/2021    Time 12    Period Weeks    Status New      PT LONG TERM GOAL #2   Title Pt will perform stand pivot transfer w/c<>mat with min assist for improved mobility, decreased caregiver burden.    Time 12    Period Weeks    Status New      PT LONG TERM GOAL #3   Title Pt will perform at least 5 minutes of standing at sink, with min assist, for improved participation in ADLs.    Time 12    Period Weeks     Status New  PT LONG TERM GOAL #4   Title Further gait assessment as strength and standing tolerance progresses, with goal to be written as appropriate.                   Plan - 11/25/21 1324     Clinical Impression Statement Today's skilled session continued to focus on transfers, sitting balance and strengthening. Continues to need up to mod/max assist for most mobility due to pusher tendency. Did progress to supervision for unsupported sitting at edge of mat by end of session. The pt is making steady progress toward goals and should benefit from continued PT to progress toward unmet goals    Personal Factors and Comorbidities Comorbidity 2    Examination-Activity Limitations Locomotion Level;Transfers;Sit;Stand;Dressing;Toileting;Hygiene/Grooming;Bed Mobility;Bathing    Examination-Participation Restrictions Occupation;Community Activity;Church    Stability/Clinical Decision Making Evolving/Moderate complexity    Rehab Potential Fair    PT Frequency 2x / week    PT Duration 12 weeks    PT Treatment/Interventions ADLs/Self Care Home Management;Functional mobility training;Therapeutic activities;Therapeutic exercise;Balance training;Patient/family education;Orthotic Fit/Training;Wheelchair mobility training;Manual techniques;Passive range of motion;Neuromuscular re-education    PT Next Visit Plan Continue to work on sitting on [place] bench (legs abd on either side) for ant pelvic tilt, trunk dissociation, lying on R side to reduce pusher tendencies             Patient will benefit from skilled therapeutic intervention in order to improve the following deficits and impairments:  Decreased range of motion, Impaired tone, Difficulty walking, Decreased activity tolerance, Decreased balance, Impaired flexibility, Decreased mobility, Decreased strength, Postural dysfunction  Visit Diagnosis: Hemiplegia and hemiparesis following nontraumatic intracerebral hemorrhage affecting left  non-dominant side (HCC)  Muscle weakness (generalized)  Unsteadiness on feet  Abnormal posture     Problem List Patient Active Problem List   Diagnosis Date Noted   At high risk for inadequate nutritional intake 07/12/2021   Transaminitis 06/06/2021   Intraparenchymal hematoma of brain 06/04/2021   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[facility]) 05/30/2021   Right lower lobe pneumonia 05/30/2021   Prediabetes 05/30/2021   Hyperglycemia 05/30/2021   Intracerebral hemorrhage 05/24/2021   Seizure ([facility]) 05/23/2021   Malnutrition of moderate degree 05/22/2021   Memory deficits    OSA (obstructive sleep apnea)    Generalized OA    Hypokalemia    Leukocytosis    Annual physical exam 04/03/2017    Sallyanne Kuster, PTA, [facility] [address], [address] [place], [place] [address] [phone] 11/26/21, 11:22 AM   Name: Donald [name] MRN: [id number] Date of Birth: [birth year]

## 2021-11-30 ENCOUNTER — Ambulatory Visit: Payer: No Typology Code available for payment source

## 2021-11-30 ENCOUNTER — Other Ambulatory Visit: Payer: Self-pay

## 2021-11-30 ENCOUNTER — Ambulatory Visit: Payer: No Typology Code available for payment source | Admitting: Occupational Therapy

## 2021-11-30 DIAGNOSIS — M6281 Muscle weakness (generalized): Secondary | ICD-10-CM

## 2021-11-30 DIAGNOSIS — I69154 Hemiplegia and hemiparesis following nontraumatic intracerebral hemorrhage affecting left non-dominant side: Secondary | ICD-10-CM | POA: Diagnosis not present

## 2021-11-30 DIAGNOSIS — M25632 Stiffness of left wrist, not elsewhere classified: Secondary | ICD-10-CM

## 2021-11-30 DIAGNOSIS — R278 Other lack of coordination: Secondary | ICD-10-CM

## 2021-11-30 DIAGNOSIS — R4701 Aphasia: Secondary | ICD-10-CM

## 2021-11-30 DIAGNOSIS — R293 Abnormal posture: Secondary | ICD-10-CM

## 2021-11-30 DIAGNOSIS — R2681 Unsteadiness on feet: Secondary | ICD-10-CM

## 2021-11-30 DIAGNOSIS — R41841 Cognitive communication deficit: Secondary | ICD-10-CM

## 2021-11-30 NOTE — Therapy (Signed)
[facility] [address] [place], [place], [address] Phone: [phone]   Fax:  [phone]  Physical Therapy Treatment  Patient Details  Name: Donald [name] MRN: [id number] Date of Birth: [birth year] Referring Provider (PT): Alger Simons   Encounter Date: 11/30/2021   PT End of Session - 11/30/21 1328     Visit Number 12    Number of Visits 25    Date for PT Re-Evaluation 12/10/21    Authorization Type [place] [employer]-front office trying to contact insurance company for VL information    Progress Note Due on Visit 20    PT Start Time 1325   transportation running late   PT Stop Time 1359    PT Time Calculation (min) 34 min    Equipment Utilized During Treatment Gait belt    Activity Tolerance Patient limited by lethargy;Patient limited by fatigue    Behavior During Therapy Flat affect             Past Medical History:  Diagnosis Date   Arthritis     Past Surgical History:  Procedure Laterality Date   APPENDECTOMY     IR ANGIO INTRA EXTRACRAN SEL COM CAROTID INNOMINATE BILAT MOD SED  05/28/2021   IR ANGIO VERTEBRAL SEL VERTEBRAL BILAT MOD SED  05/28/2021   IR IVC FILTER PLMT / S&I /IMG GUID/MOD SED  05/27/2021   IR US GUIDE VASC ACCESS RIGHT  05/27/2021    There were no vitals filed for this visit.   Subjective Assessment - 11/30/21 1328     Subjective Pt just moved in to new apartment last night. This apartment is on first floor and more accessible for him.    Patient is accompained by: Family member    Pertinent History See problem list for PMH    Patient Stated Goals Wife's goal:  get back him to some form of mobility to walk with cane or walker.    Currently in Pain? No/denies                               [facility] Adult PT Treatment/Exercise - 11/30/21 1329       Transfers   Transfers Squat Pivot Transfers    Squat Pivot Transfers 2: Max assist     Squat Pivot Transfer Details (indicate cue type and reason) PT cued pt to lean forward away from back of chair prior to transfer. Noted that if PT tried to physically help pt would resist and push posterior. Cued to try to come towards therapist like trying to give a hug. Pt reached forward with RUE and touched back of therapists shoulder. Was able to come away from back of chair in this manner. PT assisted to position legs in flexion and then performed squat pivot to the mat with OT spotting from behind for safety. Pt was able to keep his weight forward throughout transfer.      Neuro Re-ed    Neuro Re-ed Details  Sitting edge of mat: pt initially had right leg extended and hips adducted and leaning slightly to the left. When PT tried to spread legs apart was met by resistance. Cued pt to bring RLE to the right and then slide foot back under knee. Pt able to abduct right leg and then slide foot back with CGA to slide more for cuing on what to do. Once legs were spread apart, pt able to maintain.  Cued pt to try to look up at therapist to encourage more upright posture. Placed mirror in front and performed reaching to stick 2 post-its on it to encourage anterior weight shift in front and then removing and handing to therapist.  Then to the right x 2 with moving mirror to the right some. Pt needed cuing to stay on task at times. By end of activity pt's shoulders were over hips.                       PT Short Term Goals - 11/02/21 1125       PT SHORT TERM GOAL #1   Title Pt will perform HEP with family supervision for improved strength, balance, transfers, and posture for improved mobility.  TARGET 11/10/2021    Baseline Pts wife they are performing stretching at home; 11/02/21 Family has been working on LE exercises, [id number]    Time 4    Period Weeks    Status On-going      PT SHORT TERM GOAL #2   Title Pt will perform squat/stand pivot transfer w/c<>mat with mod assist for decreased  caregiver burden.    Baseline max assist 10/13/21; 11/02/21 Patient requires max assist to stand due to L side tone    Time 4    Period Weeks    Status On-going      PT SHORT TERM GOAL #3   Title Pt will sit edge of mat, with intermittent RUE support and supervision at least 5 minutes, for improved participation with ADLs.    Baseline Pt was able to sit at EOB with RUE support at S x 3 mins 10/13/21; 11/02/21 Patient able to sit unsupported for >5 min to allow active participation in PT    Time 4    Period Weeks    Status Revised      PT SHORT TERM GOAL #4   Title Pt will perform standing at sink/parallel bars, x 2 minutes, with mod assist, for improved participation in ADLs.    Baseline Did not perform as not appropriate at this time    Time 4    Period Weeks    Status On-going               PT Long Term Goals - 09/17/21 1328       PT LONG TERM GOAL #1   Title Pt will perform progression of HEP with family supervision for improved strength, balance, transfers, and posture.  TARGET 12/10/2021    Time 12    Period Weeks    Status New      PT LONG TERM GOAL #2   Title Pt will perform stand pivot transfer w/c<>mat with min assist for improved mobility, decreased caregiver burden.    Time 12    Period Weeks    Status New      PT LONG TERM GOAL #3   Title Pt will perform at least 5 minutes of standing at sink, with min assist, for improved participation in ADLs.    Time 12    Period Weeks    Status New      PT LONG TERM GOAL #4   Title Further gait assessment as strength and standing tolerance progresses, with goal to be written as appropriate.                   Plan - 11/30/21 1951     Clinical Impression Statement Pt does better when he  initiates movements on his own. Resists when forced in to movements. Pt was alert and participated well during session with continuing to work on more upright posture and anterior weight shift in sitting.    Personal Factors  and Comorbidities Comorbidity 2    Examination-Activity Limitations Locomotion Level;Transfers;Sit;Stand;Dressing;Toileting;Hygiene/Grooming;Bed Mobility;Bathing    Examination-Participation Restrictions Occupation;Community Activity;Church    Stability/Clinical Decision Making Evolving/Moderate complexity    Rehab Potential Fair    PT Frequency 2x / week    PT Duration 12 weeks    PT Treatment/Interventions ADLs/Self Care Home Management;Functional mobility training;Therapeutic activities;Therapeutic exercise;Balance training;Patient/family education;Orthotic Fit/Training;Wheelchair mobility training;Manual techniques;Passive range of motion;Neuromuscular re-education    PT Next Visit Plan Recert next visit. Encourage pt to perform more active movements on own as pushes against resistance. Continue to work on sitting on [place] bench (legs abd on either side) for ant pelvic tilt, trunk dissociation, lying on R side to reduce pusher tendencies             Patient will benefit from skilled therapeutic intervention in order to improve the following deficits and impairments:  Decreased range of motion, Impaired tone, Difficulty walking, Decreased activity tolerance, Decreased balance, Impaired flexibility, Decreased mobility, Decreased strength, Postural dysfunction  Visit Diagnosis: Abnormal posture  Muscle weakness (generalized)     Problem List Patient Active Problem List   Diagnosis Date Noted   At high risk for inadequate nutritional intake 07/12/2021   Transaminitis 06/06/2021   Intraparenchymal hematoma of brain 06/04/2021   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[place]) 05/30/2021   Right lower lobe pneumonia 05/30/2021   Prediabetes 05/30/2021   Hyperglycemia 05/30/2021   Intracerebral hemorrhage 05/24/2021   Seizure ([place]) 05/23/2021   Malnutrition of moderate degree 05/22/2021   Memory deficits    OSA (obstructive sleep apnea)    Generalized OA    Hypokalemia    Leukocytosis    Annual  physical exam 04/03/2017    Electa Sniff, PT, DPT, [facility] 11/30/2021, 7:56 PM  [facility] [address] [place], [place], [address] Phone: [phone]   Fax:  [phone]  Name: Donald [name] MRN: [id number] Date of Birth: 1956-08-08

## 2021-11-30 NOTE — Therapy (Signed)
[facility] [address] [place], [place], [address] Phone: [phone]   Fax:  [phone]  Speech Language Pathology Treatment  Patient Details  Name: Donald [name] MRN: [id number] Date of Birth: May 25, 1956 Referring Provider (SLP): Meredith Staggers, MD   Encounter Date: 11/30/2021   End of Session - 11/30/21 1406     Visit Number 10    Number of Visits 17    Date for SLP Re-Evaluation 12/24/21    Authorization Type UHC    SLP Start Time 9381    SLP Stop Time  1528    SLP Time Calculation (min) 43 min    Activity Tolerance Patient limited by lethargy;Patient limited by fatigue             Past Medical History:  Diagnosis Date   Arthritis     Past Surgical History:  Procedure Laterality Date   APPENDECTOMY     IR ANGIO INTRA EXTRACRAN SEL COM CAROTID INNOMINATE BILAT MOD SED  05/28/2021   IR ANGIO VERTEBRAL SEL VERTEBRAL BILAT MOD SED  05/28/2021   IR IVC FILTER PLMT / S&I /IMG GUID/MOD SED  05/27/2021   IR US GUIDE VASC ACCESS RIGHT  05/27/2021    There were no vitals filed for this visit.   Subjective Assessment - 11/30/21 1443     Subjective "wonderful"    Patient is accompained by: Family member    Currently in Pain? Yes    Pain Score 2     Pain Location Head                   ADULT SLP TREATMENT - 11/30/21 1406       General Information   Behavior/Cognition Doesn't follow directions;Requires cueing;Distractible;Confused;Lethargic      Treatment Provided   Treatment provided Cognitive-Linquistic      Cognitive-Linquistic Treatment   Treatment focused on Cognition;Aphasia;Dysarthria    Skilled Treatment SLP trialed picture naming/description/problem solving x2, with frequent max A required to identify components of the pictures and identify errors. SLP targeted recall of personally relevant long term memories, in which pt continued to frequent max A and consistent phonemic  cues for naming. SLP engaged in picture naming and subsequent yes/no questions, in which pt noted with confabulations despite SLP rephrasing and repetition. Intermittent cues required for eye contact to aid attention.      Assessment / Recommendations / Plan   Plan Continue with current plan of care     Progression Toward Goals   Progression toward goals Not progressing toward goals (comment)   severity of deficits               SLP Short Term Goals - 11/23/21 1318       SLP SHORT TERM GOAL #1   Title Pt will establish and maintain eye contact during auditory instructions given occasional mod A to aid focused attention over 3 sessions    Status Partially Met      SLP SHORT TERM GOAL #2   Title Pt will comprehend and demonstrate 1-step directions with 80% accuracy given no more than 3 repetitions over 3 sessions    Baseline 11-02-21    Status Partially Met      SLP SHORT TERM GOAL #3   Title Pt will maintain topic to related question for 30+ seconds given occasional mod A over 3 sessions    Status Not Met      SLP SHORT TERM GOAL #4   Title  Pt will use multimodal communication to aid pt's comprehension and attention for 2 functional scenarios    Status Not Met      SLP SHORT TERM GOAL #5   Title Pt will comprehend simple yes/no questions with 90% accuracy with 2 or less repetitions over 2 sessions    Status Partially Met              SLP Long Term Goals - 11/25/21 1418       SLP LONG TERM GOAL #1   Title Pt will comprehend and demonstrate functional 2-step directions with 50% accuracy given no more than 3 repetitions over 2 sessions    Time 4    Period Weeks    Status On-going   all LTG ongoing for recert   Target Date 74/14/23      SLP LONG TERM GOAL #2   Title Pt will participate in 30 second to 1 minute conversation of personally relevant topics with no overt shifts in topic over 2 sessions    Time 4    Period Weeks    Status On-going    Target Date  12/24/21      SLP LONG TERM GOAL #3   Title Pt will comprehend functional simple to mod complex yes/no questions with 80% accuracy with 2 or less repetitions over 2 sessions    Time 4    Period Weeks    Status On-going    Target Date 12/24/21      SLP LONG TERM GOAL #4   Title Pt's wife will report increased comprehension and participation in daily activities with use of learned compensations by last ST session    Time 4    Period Weeks    Status On-going    Target Date 12/24/21      SLP LONG TERM GOAL #5   Title Pt will demo improved speech intelligilbity in simple conversation given occasional mod A over 2 sessions    Time 4    Period Weeks    Status On-going              Plan - 11/30/21 1407     Clinical Impression Statement Donald [name] continues to present with severe cognitive communciation impairments, 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 SLP provided communication strateiges to assist with patient comprehension and topic maintenance. Sustained attention, inititation, processing with max A. Pt required short, simple 1-2 word instructions with usual repetition and rephrasing. Occasional eye contact maintained with hand off of his head. Ongoing spouse education re: compensations for cognition and communication required. Continue skilled ST to maximize cognition, comprehension and intelligilbity for safety and to reduce caregiver burden.    Speech Therapy Frequency 2x / week    Duration 12 weeks      Treatment/Interventions Compensatory strategies;Functional tasks;Patient/family education;Multimodal communcation approach;Compensatory techniques;Internal/external aids;SLP instruction and feedback    Potential to Achieve Goals Fair    Potential Considerations Severity of impairments;Previous level of function;Ability to learn/carryover information;Cooperation/participation level;Medical prognosis    Consulted and Agree with Plan of Care Patient;Family  member/caregiver             Patient will benefit from skilled therapeutic intervention in order to improve the following deficits and impairments:   Cognitive communication deficit  Aphasia    Problem List Patient Active Problem List   Diagnosis Date Noted   At high risk for inadequate nutritional intake 07/12/2021   Transaminitis 06/06/2021   Intraparenchymal hematoma of brain 06/04/2021   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 [place], [place], [address] Phone: [phone]   Fax:  [phone]   Name: Donald [name] MRN: [id number] Date of Birth: 05/10/1956

## 2021-11-30 NOTE — Therapy (Signed)
[facility] [address] [place], [place], [address] Phone: [phone]   Fax:  [phone]  Occupational Therapy Treatment  Patient Details  Name: Donald [name] MRN: [id number] Date of Birth: February 02, 1956 Referring Provider (OT): Dr. Naaman Plummer   Encounter Date: 11/30/2021   OT End of Session - 11/30/21 1505     Visit Number 12    Number of Visits 25    Date for OT Re-Evaluation 12/09/21    Authorization Time Period goals written for 12 weeks, pt scheduled only for 6 week trial at this time    Authorization - Visit Number 12    Progress Note Due on Visit 48    OT Start Time 1400    OT Stop Time 1443    OT Time Calculation (min) 43 min    Activity Tolerance Patient tolerated treatment well    Behavior During Therapy [facility] for tasks assessed/performed             Past Medical History:  Diagnosis Date   Arthritis     Past Surgical History:  Procedure Laterality Date   APPENDECTOMY     IR ANGIO INTRA EXTRACRAN SEL COM CAROTID INNOMINATE BILAT MOD SED  05/28/2021   IR ANGIO VERTEBRAL SEL VERTEBRAL BILAT MOD SED  05/28/2021   IR IVC FILTER PLMT / S&I /IMG GUID/MOD SED  05/27/2021   IR US GUIDE VASC ACCESS RIGHT  05/27/2021    There were no vitals filed for this visit.   Subjective Assessment - 11/30/21 1504     Subjective  Pt denies pain    Pertinent History 65 y.o. male hospitlized with  [place] R frontal lobe and small adjacent  SAH, pt transferred to rehab then he exeperienced worsening midline shift of R MCA, R ACA and DVT in RLE    Limitations hx of seizure, no estim without MD clearance, hx of memory deficit OSA    Patient Stated Goals be more independent    Currently in Pain? No/denies                  Treatment: Sitting edge of mat with supervision, pt performed functional reaching activities with RUE for increased postural control, mod v.c Transfer mat to w/c with +2 mod-max assist for safety, pt  =40% Seated at table card matching task for cognitive component, mod-max v.c                  OT Short Term Goals - 11/25/21 1558       OT SHORT TERM GOAL #1   Title Pt's family with be I with inital HEP.    Baseline Min verbal/tactile cues for don/doff    Time 6    Period Weeks    Status Achieved    Target Date 10/28/21      OT SHORT TERM GOAL #2   Title Pt's family with be I with splint wear, care and precautions and LUE positioning to minimize pain and contracture.    Time 6    Period Weeks    Status Achieved      OT SHORT TERM GOAL #3   Title Pt will consistently wash face and chest with no more than min A.    Time 6    Period Weeks    Status Partially Met   per wife - washing face not chest     OT SHORT TERM GOAL #4   Title Pt will attend to left hemibody space at  least 10% of the time with no more than mod v.c    Time 6    Period Weeks    Status Achieved      OT SHORT TERM GOAL #5   Title Pt will assist with pulling down shirt in front at least 25% of them with min v.c    Time 6    Period Weeks    Status Partially Met      OT SHORT TERM GOAL #6   Title Pt will follow a simple  1 step command 75% of the time during therapy.    Time 6    Period Weeks    Status Partially Met   follows 1 step commands inconsistently              OT Long Term Goals - 11/30/21 1506       OT LONG TERM GOAL #1   Title Pt will donn shirt with mod A, and mod v.c    Time 12    Period Weeks    Status On-going      OT LONG TERM GOAL #2   Title Pt will attend to L hemibody space during ADLs and functional activities at least 25% of the time with min v.c    Time 12    Period Weeks    Status On-going      OT LONG TERM GOAL #3   Title Pt will perfom UB bathing with min A    Time 12    Period Weeks    Status On-going      OT LONG TERM GOAL #4   Title Pt will attend to a simple ADL or functional task for at least 15 mins with no more than min v.c    Time 12     Period Weeks    Status On-going      OT LONG TERM GOAL #5   Title Pt will tolerate P/ROM shoulder flexion to 90* with pain no greater than 2/10 in prep for dressing.    Time 12    Period Weeks    Status On-going      OT LONG TERM GOAL #6   Title Pt will perform toilet transfers with mod A.    Time 12    Period Weeks    Status On-going                   Plan - 11/30/21 1505     Clinical Impression Statement Patient with slow progress but he demonstrates improved sitting balance and alertness today.    OT Occupational Profile and History Detailed Assessment- Review of Records and additional review of physical, cognitive, psychosocial history related to current functional performance    Occupational performance deficits (Please refer to evaluation for details): ADL's;IADL's;Rest and Sleep;Play;Leisure;Social Participation    Body Structure / Function / Physical Skills ADL;UE functional use;Flexibility;Pain;Vision;FMC;Proprioception;ROM;Gait;Coordination;GMC;Sensation;Decreased knowledge of precautions;Decreased knowledge of use of DME;Dexterity;Mobility;Tone;Strength;IADL    Cognitive Skills Attention;Energy/Drive;Memory;Orientation;Perception;Problem Solve;Safety Awareness;Sequencing;Thought;Understand    Rehab Potential Good    Clinical Decision Making Several treatment options, min-mod task modification necessary    Comorbidities Affecting Occupational Performance: May have comorbidities impacting occupational performance    Modification or Assistance to Complete Evaluation  Min-Moderate modification of tasks or assist with assess necessary to complete eval    OT Frequency 2x / week    OT Duration 12 weeks    OT Treatment/Interventions Self-care/ADL training;Ultrasound;Visual/perceptual remediation/compensation;Patient/family education;Balance training;Passive range of motion;Gait Training;Paraffin;Cryotherapy;Fluidtherapy;Splinting;Therapist, nutritional;Contrast  Bath;Moist Heat;Therapeutic  exercise;Manual Therapy;Therapeutic activities;Cognitive remediation/compensation;Neuromuscular education    Plan continue NMR - Postural control, weight shifting forward and left    Consulted and Agree with Plan of Care Patient;Family member/caregiver             Patient will benefit from skilled therapeutic intervention in order to improve the following deficits and impairments:   Body Structure / Function / Physical Skills: ADL, UE functional use, Flexibility, Pain, Vision, FMC, Proprioception, ROM, Gait, Coordination, GMC, Sensation, Decreased knowledge of precautions, Decreased knowledge of use of DME, Dexterity, Mobility, Tone, Strength, IADL Cognitive Skills: Attention, Energy/Drive, Memory, Orientation, Perception, Problem Solve, Safety Awareness, Sequencing, Thought, Understand     Visit Diagnosis: 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)  Muscle weakness (generalized)  Unsteadiness on feet  Abnormal posture  Stiffness of left wrist, not elsewhere classified  Other lack of coordination    Problem List Patient Active Problem List   Diagnosis Date Noted   At high risk for inadequate nutritional intake 07/12/2021   Transaminitis 06/06/2021   Intraparenchymal hematoma of brain 06/04/2021   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[place]) 05/30/2021   Right lower lobe pneumonia 05/30/2021   Prediabetes 05/30/2021   Hyperglycemia 05/30/2021   Intracerebral hemorrhage 05/24/2021   Seizure ([place]) 05/23/2021   Malnutrition of moderate degree 05/22/2021   Memory deficits    OSA (obstructive sleep apnea)    Generalized OA    Hypokalemia    Leukocytosis    Annual physical exam 04/03/2017    Saron Vanorman, OT 11/30/2021, 3:16 PM  [facility] [address] [place], [place], [address] Phone: [phone]   Fax:   [phone]  Name: Donald [name] MRN: [id number] Date of Birth: November 01, 1956

## 2021-12-02 ENCOUNTER — Ambulatory Visit: Payer: No Typology Code available for payment source

## 2021-12-02 ENCOUNTER — Ambulatory Visit: Payer: No Typology Code available for payment source | Admitting: Occupational Therapy

## 2021-12-02 ENCOUNTER — Encounter: Payer: Self-pay | Admitting: Occupational Therapy

## 2021-12-02 ENCOUNTER — Other Ambulatory Visit: Payer: Self-pay

## 2021-12-02 DIAGNOSIS — R293 Abnormal posture: Secondary | ICD-10-CM

## 2021-12-02 DIAGNOSIS — R4701 Aphasia: Secondary | ICD-10-CM

## 2021-12-02 DIAGNOSIS — M25632 Stiffness of left wrist, not elsewhere classified: Secondary | ICD-10-CM

## 2021-12-02 DIAGNOSIS — I69154 Hemiplegia and hemiparesis following nontraumatic intracerebral hemorrhage affecting left non-dominant side: Secondary | ICD-10-CM | POA: Diagnosis not present

## 2021-12-02 DIAGNOSIS — M6281 Muscle weakness (generalized): Secondary | ICD-10-CM

## 2021-12-02 DIAGNOSIS — M25622 Stiffness of left elbow, not elsewhere classified: Secondary | ICD-10-CM

## 2021-12-02 DIAGNOSIS — R2689 Other abnormalities of gait and mobility: Secondary | ICD-10-CM

## 2021-12-02 DIAGNOSIS — M25612 Stiffness of left shoulder, not elsewhere classified: Secondary | ICD-10-CM

## 2021-12-02 DIAGNOSIS — R2681 Unsteadiness on feet: Secondary | ICD-10-CM

## 2021-12-02 DIAGNOSIS — R278 Other lack of coordination: Secondary | ICD-10-CM

## 2021-12-02 DIAGNOSIS — R41841 Cognitive communication deficit: Secondary | ICD-10-CM

## 2021-12-02 NOTE — Therapy (Signed)
[facility] [address] [place], [place], [address] Phone: [phone]   Fax:  [phone]  Occupational Therapy Treatment  Patient Details  Name: Donald [name] MRN: [id number] Date of Birth: [birth year] Referring Provider (OT): Dr. Naaman Plummer   Encounter Date: 12/02/2021   OT End of Session - 12/02/21 1731     Visit Number 13    Number of Visits 25    Date for OT Re-Evaluation 12/09/21    Authorization Type U[employer]    Authorization Time Period goals written for 12 weeks, pt scheduled only for 6 week trial at this time    Authorization - Visit Number 13    Progress Note Due on Visit 6    OT Start Time 1530    OT Stop Time 1615    OT Time Calculation (min) 45 min    Activity Tolerance Patient tolerated treatment well             Past Medical History:  Diagnosis Date   Arthritis     Past Surgical History:  Procedure Laterality Date   APPENDECTOMY     IR ANGIO INTRA EXTRACRAN SEL COM CAROTID INNOMINATE BILAT MOD SED  05/28/2021   IR ANGIO VERTEBRAL SEL VERTEBRAL BILAT MOD SED  05/28/2021   IR IVC FILTER PLMT / S&I /IMG GUID/MOD SED  05/27/2021   IR US GUIDE VASC ACCESS RIGHT  05/27/2021    There were no vitals filed for this visit.   Subjective Assessment - 12/02/21 1728     Subjective  It is wonderful    Patient is accompanied by: Family member    Pertinent History 65 y.o. male hospitlized with  [place] R frontal lobe and small adjacent  SAH, pt transferred to rehab then he exeperienced worsening midline shift of R MCA, R ACA and DVT in RLE    Limitations hx of seizure, no estim without MD clearance, hx of memory deficit OSA    Currently in Pain? No/denies    Pain Score 0-No pain                          OT Treatments/Exercises (OP) - 12/02/21 0001       ADLs   Functional Mobility Working on components of sit to stand and stand to sit to reduce caregiver burden with basic self care skills.   Also using more upright positions to foster greater alertrness and periods of focused attention.  Patient able to assist with standing by pulling up on grab bar.  Patient does not have adequate hip flexion to truly shift weight forward over feet.  Patient able to work on shifting upper trunk forward to assist with transition.  Worked on building hip and trunk flexion range, and following simple one step directives.                      OT Short Term Goals - 12/02/21 1733       OT SHORT TERM GOAL #1   Title Pt's family with be I with inital HEP.    Baseline Min verbal/tactile cues for don/doff    Time 6    Period Weeks    Status Achieved    Target Date 10/28/21      OT SHORT TERM GOAL #2   Title Pt's family with be I with splint wear, care and precautions and LUE positioning to minimize pain and contracture.    Time 6  Period Weeks    Status Achieved      OT SHORT TERM GOAL #3   Title Pt will consistently wash face and chest with no more than min A.    Time 6    Period Weeks    Status Partially Met   per wife - washing face not chest     OT SHORT TERM GOAL #4   Title Pt will attend to left hemibody space at least 10% of the time with no more than mod v.c    Time 6    Period Weeks    Status Achieved      OT SHORT TERM GOAL #5   Title Pt will assist with pulling down shirt in front at least 25% of them with min v.c    Time 6    Period Weeks    Status Partially Met      OT SHORT TERM GOAL #6   Title Pt will follow a simple  1 step command 75% of the time during therapy.    Time 6    Period Weeks    Status Partially Met   follows 1 step commands inconsistently              OT Long Term Goals - 12/02/21 1733       OT LONG TERM GOAL #1   Title Pt will donn shirt with mod A, and mod v.c    Time 12    Period Weeks    Status On-going      OT LONG TERM GOAL #2   Title Pt will attend to L hemibody space during ADLs and functional activities at least 25%  of the time with min v.c    Time 12    Period Weeks    Status On-going      OT LONG TERM GOAL #3   Title Pt will perfom UB bathing with min A    Time 12    Period Weeks    Status On-going      OT LONG TERM GOAL #4   Title Pt will attend to a simple ADL or functional task for at least 15 mins with no more than min v.c    Time 12    Period Weeks    Status On-going      OT LONG TERM GOAL #5   Title Pt will tolerate P/ROM shoulder flexion to 90* with pain no greater than 2/10 in prep for dressing.    Time 12    Period Weeks    Status On-going      OT LONG TERM GOAL #6   Title Pt will perform toilet transfers with mod A.    Time 12    Period Weeks    Status On-going                   Plan - 12/02/21 1732     Clinical Impression Statement Patient showing improved alertness, and focused attention x 3-5 seconds this session    OT Occupational Profile and History Detailed Assessment- Review of Records and additional review of physical, cognitive, psychosocial history related to current functional performance    Occupational performance deficits (Please refer to evaluation for details): ADL's;IADL's;Rest and Sleep;Play;Leisure;Social Participation    Body Structure / Function / Physical Skills ADL;UE functional use;Flexibility;Pain;Vision;FMC;Proprioception;ROM;Gait;Coordination;GMC;Sensation;Decreased knowledge of precautions;Decreased knowledge of use of DME;Dexterity;Mobility;Tone;Strength;IADL    Cognitive Skills Attention;Energy/Drive;Memory;Orientation;Perception;Problem Solve;Safety Awareness;Sequencing;Thought;Understand    Rehab Potential Good    Clinical Decision  Making Several treatment options, min-mod task modification necessary    Comorbidities Affecting Occupational Performance: May have comorbidities impacting occupational performance    Modification or Assistance to Complete Evaluation  Min-Moderate modification of tasks or assist with assess necessary to  complete eval    OT Frequency 2x / week    OT Duration 12 weeks    OT Treatment/Interventions Self-care/ADL training;Ultrasound;Visual/perceptual remediation/compensation;Patient/family education;Balance training;Passive range of motion;Gait Training;Paraffin;Cryotherapy;Fluidtherapy;Splinting;Therapist, nutritional;Contrast Bath;Moist Heat;Therapeutic exercise;Manual Therapy;Therapeutic activities;Cognitive remediation/compensation;Neuromuscular education    Plan continue NMR - Postural control, weight shifting forward and left    Consulted and Agree with Plan of Care Patient;Family member/caregiver             Patient will benefit from skilled therapeutic intervention in order to improve the following deficits and impairments:   Body Structure / Function / Physical Skills: ADL, UE functional use, Flexibility, Pain, Vision, FMC, Proprioception, ROM, Gait, Coordination, GMC, Sensation, Decreased knowledge of precautions, Decreased knowledge of use of DME, Dexterity, Mobility, Tone, Strength, IADL Cognitive Skills: Attention, Energy/Drive, Memory, Orientation, Perception, Problem Solve, Safety Awareness, Sequencing, Thought, Understand     Visit Diagnosis: 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)  Abnormal posture  Muscle weakness (generalized)  Unsteadiness on feet  Stiffness of left wrist, not elsewhere classified  Other lack of coordination  Stiffness of left shoulder, not elsewhere classified  Stiffness of left elbow, not elsewhere classified    Problem List Patient Active Problem List   Diagnosis Date Noted   At high risk for inadequate nutritional intake 07/12/2021   Transaminitis 06/06/2021   Intraparenchymal hematoma of brain 06/04/2021   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[place]) 05/30/2021   Right lower lobe pneumonia 05/30/2021   Prediabetes 05/30/2021   Hyperglycemia 05/30/2021   Intracerebral  hemorrhage 05/24/2021   Seizure ([place]) 05/23/2021   Malnutrition of moderate degree 05/22/2021   Memory deficits    OSA (obstructive sleep apnea)    Generalized OA    Hypokalemia    Leukocytosis    Annual physical exam 04/03/2017    Mariah Milling, OT 12/02/2021, 5:34 PM  [facility] [address] [place], [place], [address] Phone: [phone]   Fax:  [phone]  Name: Orpheus [place] MRN: [id number] Date of Birth: 01/31/1956

## 2021-12-03 NOTE — Therapy (Signed)
[facility] [address] [place], [place], [address] Phone: [phone]   Fax:  [phone]  Physical Therapy Treatment  Patient Details  Name: Donald [name] MRN: [id number] Date of Birth: 1956-02-12 Referring Provider (PT): Alger Simons   Encounter Date: 12/02/2021   PT End of Session - 12/02/21 1614     Visit Number 13    Number of Visits 37    Date for PT Re-Evaluation 02/25/22    Authorization Type [employer]-60 visit limit (if seen on same day counts as only 1 visit)    Progress Note Due on Visit 20    PT Start Time 1615    PT Stop Time 1702    PT Time Calculation (min) 47 min    Equipment Utilized During Treatment Gait belt    Activity Tolerance Patient limited by lethargy;Patient limited by fatigue    Behavior During Therapy Flat affect             Past Medical History:  Diagnosis Date   Arthritis     Past Surgical History:  Procedure Laterality Date   APPENDECTOMY     IR ANGIO INTRA EXTRACRAN SEL COM CAROTID INNOMINATE BILAT MOD SED  05/28/2021   IR ANGIO VERTEBRAL SEL VERTEBRAL BILAT MOD SED  05/28/2021   IR IVC FILTER PLMT / S&I /IMG GUID/MOD SED  05/27/2021   IR US GUIDE VASC ACCESS RIGHT  05/27/2021    There were no vitals filed for this visit.   Subjective Assessment - 12/03/21 1424     Subjective Pt just finished OT session and was working on standing in // bars. PT spoke with wife and she has been working on him standing more in [place] other than just for transfers to try to have him up more.    Patient is accompained by: Family member    Pertinent History See problem list for PMH    Patient Stated Goals Wife's goal:  get back him to some form of mobility to walk with cane or walker.    Currently in Pain? No/denies    Pain Onset More than a month ago                [facility] PT Assessment - 12/02/21 1704       Assessment   Medical Diagnosis ICH, SAH     Referring Provider (PT) Alger Simons    Onset Date/Surgical Date 05/11/21      PROM   PROM Assessment Site Hip;Knee    Right/Left Hip Left    Left Hip Flexion 58    Right/Left Knee Left    Left Knee Extension lacking 45 degrees                           [facility] Adult PT Treatment/Exercise - 12/02/21 1704       Bed Mobility   Bed Mobility Sit to Supine;Right Sidelying to Sit;Rolling Right    Rolling Right Total Assistance - Patient < 25%    Right Sidelying to Sit Total Assistance - Patient < 25%   PT moved pt's legs off. Pt able to push some with right RUE but resists when being assisted up. RLE extends making difficult to come up.   Sit to Supine Total Assistance - Patient < 25%      Transfers   Transfers Squat Pivot Transfers    Squat Pivot Transfers 2: Max assist  Squat Pivot Transfer Details (indicate cue type and reason) PT cued pt to lean forward away from back of chair prior to transfer. Noted that if PT tried to physically help pt would resist and push posterior. Cued to try to come towards therapist like trying to give a hug. Pt reached forward with RUE and touched back of therapists shoulder. Was able to come away from back of chair in this manner. PT assisted to position legs in flexion and then performed squat pivot to the mat. Returned to w/c at end to the right which required more assistance as pt fatigued.      Neuro Re-ed    Neuro Re-ed Details  Sitting edge of mat pt able to maintain balance close supervision. Min assist with verbal and tactile cues to move RLE to right some and bend knee to get his leg under him. Pt responded better when single step commands and increased time to perform. Able to come out of adducted to left posturing at legs when he did this. In supine: again assessed posture with pt adducting and turning to the left. Again cued him to move RLE over to right and he was able to straighten leg as well. LLE in flexed position even with  PT performing gentle stretching in to extension. Assessed left hip flexion which was also limited. Rolled pt to the right and was able to get head position to improve some with gravity assist. PT performed gentle massage to left lateral neck with shoulder depression. Discussed with wife to use this position for brief periods to try to help stretch out neck. Also discussed placing towel roll under right side to try to open up left side more. She acknowledged understanding.                     PT Education - 12/03/21 1437     Education Details Discussed recert plan with pt and wife. Educated wife on positioning in right sidelying for brief periods of time to help stretch out neck and left side. Also to continue to try to spend a little more time in [place] for more upright posture.    Person(s) Educated Patient;Spouse    Methods Explanation;Demonstration    Comprehension Verbalized understanding              PT Short Term Goals - 11/02/21 1125       PT SHORT TERM GOAL #1   Title Pt will perform HEP with family supervision for improved strength, balance, transfers, and posture for improved mobility.  TARGET 11/10/2021    Baseline Pts wife they are performing stretching at home; 11/02/21 Family has been working on LE exercises, [id number]    Time 4    Period Weeks    Status On-going      PT SHORT TERM GOAL #2   Title Pt will perform squat/stand pivot transfer w/c<>mat with mod assist for decreased caregiver burden.    Baseline max assist 10/13/21; 11/02/21 Patient requires max assist to stand due to L side tone    Time 4    Period Weeks    Status On-going      PT SHORT TERM GOAL #3   Title Pt will sit edge of mat, with intermittent RUE support and supervision at least 5 minutes, for improved participation with ADLs.    Baseline Pt was able to sit at EOB with RUE support at S x 3 mins 10/13/21; 11/02/21 Patient able to sit unsupported for >5  min to allow active participation in PT     Time 4    Period Weeks    Status Revised      PT SHORT TERM GOAL #4   Title Pt will perform standing at sink/parallel bars, x 2 minutes, with mod assist, for improved participation in ADLs.    Baseline Did not perform as not appropriate at this time    Time 4    Period Weeks    Status On-going               PT Long Term Goals - 12/03/21 1445       PT LONG TERM GOAL #1   Title Pt will perform progression of HEP with family supervision for improved strength, balance, transfers, and posture.  TARGET 12/10/2021    Baseline 12/02/21 PT continues to add to HEP    Time 12    Period Weeks    Status On-going      PT LONG TERM GOAL #2   Title Pt will perform stand pivot transfer w/c<>mat with min assist for improved mobility, decreased caregiver burden.    Baseline 12/02/21 squat pivot transfer total assist    Time 12    Period Weeks    Status Not Met      PT LONG TERM GOAL #3   Title Pt will perform at least 5 minutes of standing at sink, with min assist, for improved participation in ADLs.    Baseline 12/02/21 total assist to stand in [place]    Time 12    Period Weeks    Status New      PT LONG TERM GOAL #4   Title Further gait assessment as strength and standing tolerance progresses, with goal to be written as appropriate.            Updated goals:  PT Short Term Goals - 12/03/21 1500       PT SHORT TERM GOAL #1   Title Pt will be able to come forward away from back of w/c to prepare for transfer with CGA and moderate verbal cuing.    Baseline Currently needs max verbal cuing and min assist.    Time 4    Period Weeks    Status New    Target Date 12/31/21      PT SHORT TERM GOAL #2   Title Pt will perform squat pivot w/c to mat going to the left mod assist for decreased caregiver burden.    Baseline 12/02/21 total asssit    Time 4    Period Weeks    Status New    Target Date 12/31/21      PT SHORT TERM GOAL #3   Title Pt will be able to sit edge of  mat > 8 min with mild pertubations for improved sitting balance and trunk control to assist in ADLs.    Baseline Pt was able to sit at EOB with RUE support at S x 3 mins 10/13/21; 11/02/21 Patient able to sit unsupported for >5 min to allow active participation in PT    Time 4    Period Weeks    Status New    Target Date 12/31/21      PT SHORT TERM GOAL #4   Title Pt will increase left hip flexion to >70 degrees to allow for improved trunk flexion to come forward easiser with transfers.    Baseline 12/02/21 left hip flexion 58 degrees.    Time 4  Period Weeks    Status New    Target Date 12/31/21             PT Long Term Goals - 12/03/21 1506       PT LONG TERM GOAL #1   Title Pt's wife will be instructed in stretching, functional strengthening and balance HEP that she can continue to perform with husband to mazimize his function. (LTGs due 02/25/22)    Baseline 12/02/21 PT continues to add to HEP    Time 12    Period Weeks    Status Revised    Target Date 02/25/22      PT LONG TERM GOAL #2   Title Pt will perform squat pivot transfer w/c to/from mat min assist for improved mobility.    Baseline 12/02/21 squat pivot transfer total assist    Time 12    Period Weeks    Status Revised    Target Date 02/25/22      PT LONG TERM GOAL #3   Title Pt will perform at least 3 minutes of standing at sink or [place], with mod assist, for improved participation in ADLs.    Baseline 12/02/21 total assist to stand in [place]    Time 12    Period Weeks    Status Revised    Target Date 02/25/22      PT LONG TERM GOAL #4   Title Pt will be able to perform bed mobility mod assist for improved mobility and decreased caregiver burden.    Baseline 12/02/21 total assist with sit to/from supine and rolling.    Time 12    Period Weeks    Status New    Target Date 02/25/22      PT LONG TERM GOAL #5   Title Pt will increase left knee extension PROM from lacking 45 degrees to lacking 30  degrees or less for improved mobility to assist with standing.    Baseline 12/02/21 left knee extension lacking 45 degrees.    Time 12    Period Weeks    Status New    Target Date 02/25/22                   Plan - 12/03/21 1446     Clinical Impression Statement Pt was more fatigued after OT session today. Level of alertness varied over session. Did not assess standing as OT had just performed in their session and was total assist. PT further assessed functional mobility to assess progress towards goals. Pt is max to total assist for squat/pivot transfer to/from mat. He is able to initiate coming forward more when given more time and cuing to reach forward. Anterior weight shift is limited by lack of left hip flexion ability at 58 degrees with pelvis in posterior tilt position. Pt with flexor tone on left and tends to extend more on right with some pusher like tendencies. Able to correct some on own when given cues to bend right knee. No active motion noted on left side to come out of the flexed posture. Pt also has left lateral cervical flexion. Improved some when transitioned to sidelying. Pt was total assist with bed mobility. He has shown improvements in ability to be able to maintain upright sitting edge of mat. Pt will benefit from continued PT to continue to work more on improving mobility and decreasing caregiver burden.    Personal Factors and Comorbidities Comorbidity 2    Examination-Activity Limitations Locomotion Level;Transfers;Sit;Stand;Dressing;Toileting;Hygiene/Grooming;Bed Mobility;Bathing  Examination-Participation Restrictions Occupation;Community Activity;Church    Stability/Clinical Decision Making Evolving/Moderate complexity    Rehab Potential Fair    PT Frequency 2x / week    PT Duration 12 weeks    PT Treatment/Interventions ADLs/Self Care Home Management;Functional mobility training;Therapeutic activities;Therapeutic exercise;Balance training;Patient/family  education;Orthotic Fit/Training;Wheelchair mobility training;Manual techniques;Passive range of motion;Neuromuscular re-education;DME Instruction    PT Next Visit Plan . Encourage pt to perform more active movements on own as pushes against resistance. Continue to work on sitting on [place] bench (legs abd on either side) for ant pelvic tilt, trunk dissociation, lying on R side to reduce pusher tendencies and open up left side/ help with neck position.             Patient will benefit from skilled therapeutic intervention in order to improve the following deficits and impairments:  Decreased range of motion, Impaired tone, Difficulty walking, Decreased activity tolerance, Decreased balance, Impaired flexibility, Decreased mobility, Decreased strength, Postural dysfunction  Visit Diagnosis: Other abnormalities of gait and mobility  Muscle weakness (generalized)  Abnormal posture  Hemiplegia and hemiparesis following nontraumatic intracerebral hemorrhage affecting left non-dominant side [facility])     Problem List Patient Active Problem List   Diagnosis Date Noted   At high risk for inadequate nutritional intake 07/12/2021   Transaminitis 06/06/2021   Intraparenchymal hematoma of brain 06/04/2021   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[place]) 05/30/2021   Right lower lobe pneumonia 05/30/2021   Prediabetes 05/30/2021   Hyperglycemia 05/30/2021   Intracerebral hemorrhage 05/24/2021   Seizure ([place]) 05/23/2021   Malnutrition of moderate degree 05/22/2021   Memory deficits    OSA (obstructive sleep apnea)    Generalized OA    Hypokalemia    Leukocytosis    Annual physical exam 04/03/2017    Electa Sniff, PT, DPT, NCS 12/03/2021, 2:59 PM  [facility] [address] [place], [place], [address] Phone: [phone]   Fax:  [phone]  Name: Donald [name] MRN: [id number] Date of Birth: 1956-08-03

## 2021-12-03 NOTE — Therapy (Signed)
[facility] [address] [place], [place], [address] Phone: [phone]   Fax:  [phone]  Speech Language Pathology Treatment  Patient Details  Name: Donald [name] MRN: [id number] Date of Birth: 22-Apr-1956 Referring Provider (SLP): Meredith Staggers, MD   Encounter Date: 12/02/2021   End of Session - 12/02/21 1433     Visit Number 11    Number of Visits 17    Date for SLP Re-Evaluation 12/24/21    Authorization Type [employer]    SLP Start Time 0962    SLP Stop Time  8366    SLP Time Calculation (min) 45 min    Activity Tolerance Patient limited by lethargy;Patient limited by fatigue             Past Medical History:  Diagnosis Date   Arthritis     Past Surgical History:  Procedure Laterality Date   APPENDECTOMY     IR ANGIO INTRA EXTRACRAN SEL COM CAROTID INNOMINATE BILAT MOD SED  05/28/2021   IR ANGIO VERTEBRAL SEL VERTEBRAL BILAT MOD SED  05/28/2021   IR IVC FILTER PLMT / S&I /IMG GUID/MOD SED  05/27/2021   IR US GUIDE VASC ACCESS RIGHT  05/27/2021    There were no vitals filed for this visit.   Subjective Assessment - 12/02/21 1514     Subjective "super well"    Patient is accompained by: Family member    Currently in Pain? Yes    Pain Score 2     Pain Location Head                   ADULT SLP TREATMENT - 12/02/21 1433       General Information   Behavior/Cognition Doesn't follow directions;Requires cueing;Distractible;Confused;Lethargic      Treatment Provided   Treatment provided Cognitive-Linquistic      Cognitive-Linquistic Treatment   Treatment focused on Cognition;Aphasia;Dysarthria    Skilled Treatment Pt previously loved to sketch and be creative. SLP trialed drawing and writing today to address sustained attention and following directions. Frequent max verbal and visual A required to draw simple shapes and consistent visual cues alternate to next task to reduce  perseverations. SLP trialed copying words, in which peservations exhibited for specific letters. Pt named pictures with ~25% accuracy this session.      Assessment / Recommendations / Plan   Plan Continue with current plan of care;Goals updated      Progression Toward Goals   Progression toward goals Not progressing toward goals (comment)   severity of deficits             SLP Education - 12/03/21 0752     Education Details functional tasks to trial at home    Person(s) Educated Patient;Spouse    Methods Explanation;Demonstration;Verbal cues    Comprehension Verbalized understanding;Need further instruction              SLP Short Term Goals - 11/23/21 1318       SLP SHORT TERM GOAL #1   Title Pt will establish and maintain eye contact during auditory instructions given occasional mod A to aid focused attention over 3 sessions    Status Partially Met      SLP SHORT TERM GOAL #2   Title Pt will comprehend and demonstrate 1-step directions with 80% accuracy given no more than 3 repetitions over 3 sessions    Baseline 11-02-21    Status Partially Met      SLP SHORT TERM  GOAL #3   Title Pt will maintain topic to related question for 30+ seconds given occasional mod A over 3 sessions    Status Not Met      SLP SHORT TERM GOAL #4   Title Pt will use multimodal communication to aid pt's comprehension and attention for 2 functional scenarios    Status Not Met      SLP SHORT TERM GOAL #5   Title Pt will comprehend simple yes/no questions with 90% accuracy with 2 or less repetitions over 2 sessions    Status Partially Met              SLP Long Term Goals - 11/25/21 1418       SLP LONG TERM GOAL #1   Title Pt will comprehend and demonstrate functional 2-step directions with 50% accuracy given no more than 3 repetitions over 2 sessions    Time 4    Period Weeks    Status On-going   all LTG ongoing for recert   Target Date 16/38/45      SLP LONG TERM GOAL #2    Title Pt will participate in 30 second to 1 minute conversation of personally relevant topics with no overt shifts in topic over 2 sessions    Time 4    Period Weeks    Status On-going    Target Date 12/24/21      SLP LONG TERM GOAL #3   Title Pt will comprehend functional simple to mod complex yes/no questions with 80% accuracy with 2 or less repetitions over 2 sessions    Time 4    Period Weeks    Status On-going    Target Date 12/24/21      SLP LONG TERM GOAL #4   Title Pt's wife will report increased comprehension and participation in daily activities with use of learned compensations by last ST session    Time 4    Period Weeks    Status On-going    Target Date 12/24/21      SLP LONG TERM GOAL #5   Title Pt will demo improved speech intelligilbity in simple conversation given occasional mod A over 2 sessions    Time 4    Period Weeks    Status On-going              Plan - 12/02/21 1433     Clinical Impression Statement Lamorris continues to present with severe cognitive communciation impairments, 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 SLP provided communication strateiges to assist with patient comprehension and topic maintenance. Sustained attention, inititation, processing with max A. Pt required short, simple 1-2 word instructions with usual repetition and rephrasing. Occasional eye contact rarely established with hand off of his head. Ongoing spouse education re: compensations for cognition and communication required. Due to limited progress, SLP will plan to d/c next session so pt can focus on PT/OT given ongoing lethargy and fatigue impacting ability to engage in simple cognitive tasks. Continue skilled ST to maximize cognition, comprehension and intelligilbity for safety and to reduce caregiver burden.    Speech Therapy Frequency 2x / week    Duration 12 weeks      Treatment/Interventions Compensatory strategies;Functional tasks;Patient/family  education;Multimodal communcation approach;Compensatory techniques;Internal/external aids;SLP instruction and feedback    Potential to Achieve Goals Fair    Potential Considerations Severity of impairments;Previous level of function;Ability to learn/carryover information;Cooperation/participation level;Medical prognosis    Consulted and Agree with Plan of Care Patient;Family member/caregiver  Patient will benefit from skilled therapeutic intervention in order to improve the following deficits and impairments:   Cognitive communication deficit  Aphasia    Problem List Patient Active Problem List   Diagnosis Date Noted   At high risk for inadequate nutritional intake 07/12/2021   Transaminitis 06/06/2021   Intraparenchymal hematoma of brain 06/04/2021   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[place]) 05/30/2021   Right lower lobe pneumonia 05/30/2021   Prediabetes 05/30/2021   Hyperglycemia 05/30/2021   Intracerebral hemorrhage 05/24/2021   Seizure ([place]) 05/23/2021   Malnutrition of moderate degree 05/22/2021   Memory deficits    OSA (obstructive sleep apnea)    Generalized OA    Hypokalemia    Leukocytosis    Annual physical exam 04/03/2017    Alinda Deem, [place] 12/03/2021, 7:54 AM  [facility] [address] [place], [place], [address] Phone: [phone]   Fax:  [phone]   Name: Burnett Spray MRN: [id number] Date of Birth: 03-14-1956

## 2021-12-09 ENCOUNTER — Other Ambulatory Visit: Payer: Self-pay

## 2021-12-09 ENCOUNTER — Encounter: Payer: Self-pay | Admitting: Physical Therapy

## 2021-12-09 ENCOUNTER — Encounter: Payer: No Typology Code available for payment source | Admitting: Occupational Therapy

## 2021-12-09 ENCOUNTER — Ambulatory Visit: Payer: No Typology Code available for payment source

## 2021-12-09 ENCOUNTER — Ambulatory Visit: Payer: No Typology Code available for payment source | Admitting: Physical Therapy

## 2021-12-09 DIAGNOSIS — R2681 Unsteadiness on feet: Secondary | ICD-10-CM

## 2021-12-09 DIAGNOSIS — R4701 Aphasia: Secondary | ICD-10-CM

## 2021-12-09 DIAGNOSIS — R2689 Other abnormalities of gait and mobility: Secondary | ICD-10-CM

## 2021-12-09 DIAGNOSIS — R471 Dysarthria and anarthria: Secondary | ICD-10-CM

## 2021-12-09 DIAGNOSIS — R41841 Cognitive communication deficit: Secondary | ICD-10-CM

## 2021-12-09 DIAGNOSIS — M6281 Muscle weakness (generalized): Secondary | ICD-10-CM

## 2021-12-09 DIAGNOSIS — I69154 Hemiplegia and hemiparesis following nontraumatic intracerebral hemorrhage affecting left non-dominant side: Secondary | ICD-10-CM | POA: Diagnosis not present

## 2021-12-09 DIAGNOSIS — R293 Abnormal posture: Secondary | ICD-10-CM

## 2021-12-09 NOTE — Therapy (Signed)
[facility] [address] [place], [place], [address] Phone: [phone]   Fax:  [phone]  Speech Language Pathology Treatment/Discharge Summary  Patient Details  Name: Donald [name] MRN: [id number] Date of Birth: [birth year] Referring Provider (SLP): Meredith Staggers, MD   Encounter Date: 12/09/2021   End of Session - 12/09/21 1320     Visit Number 12    Number of Visits 17    Date for SLP Re-Evaluation 12/24/21    Authorization Type [employer]    SLP Start Time 8828    SLP Stop Time  0034    SLP Time Calculation (min) 32 min    Activity Tolerance Patient limited by lethargy;Patient limited by fatigue             Past Medical History:  Diagnosis Date   Arthritis     Past Surgical History:  Procedure Laterality Date   APPENDECTOMY     IR ANGIO INTRA EXTRACRAN SEL COM CAROTID INNOMINATE BILAT MOD SED  05/28/2021   IR ANGIO VERTEBRAL SEL VERTEBRAL BILAT MOD SED  05/28/2021   IR IVC FILTER PLMT / S&I /IMG GUID/MOD SED  05/27/2021   IR US GUIDE VASC ACCESS RIGHT  05/27/2021    There were no vitals filed for this visit.   Subjective Assessment - 12/09/21 1319     Subjective "wonderful"    Currently in Pain? No/denies             SPEECH THERAPY DISCHARGE SUMMARY  Visits from Start of Care: 12  Current functional level related to goals / functional outcomes: Donald [name] presents with limited progress towards ST goals targeting cognition, aphasia, and dysarthria due to severity of impairments and ongoing fatigue/lethargy. Pt did exhibit some mild improvements in sustained attention and ability to follow one step direction with less repetition. SLP recommended functional cognitive tasks to implement at home.    Remaining deficits: Severe to profound cognitive deficits   Education / Equipment: Simple functional cognitive tasks, cueing, compensatory strategies, caregiver education   Patient agrees to  discharge. Patient goals were partially met. Patient is being discharged due to maximized rehab potential at this time .       ADULT SLP TREATMENT - 12/09/21 1320       General Information   Behavior/Cognition Doesn't follow directions;Requires cueing;Distractible;Confused;Lethargic      Treatment Provided   Treatment provided Cognitive-Linquistic      Cognitive-Linquistic Treatment   Treatment focused on Cognition;Aphasia;Dysarthria    Skilled Treatment SLP targeted sustained and selective attention for reading this session. Max cues required to attend to all letters. Some accurate reading demo'd intermittently for short single words centered on flashcard. Pt unable to match cards based on first letters despite max cues. SLP provided recommendations to target attention and cognitive skills at home, including writing, reading, and coloring at simple levels. Pt exhibits maximum potential at this time so ST discharge completed.      Assessment / Recommendations / Plan   Plan Discharge SLP treatment due to (comment)   maximum potential at this time     Progression Toward Goals   Progression toward goals Goals partially met, education completed, patient discharged from [facility] - 12/09/21 1352     Education Details discharge summary, SLP recommendations    Person(s) Educated Patient;Spouse    Methods Explanation    Comprehension Verbalized understanding;Returned demonstration  SLP Short Term Goals - 11/23/21 1318       SLP SHORT TERM GOAL #1   Title Pt will establish and maintain eye contact during auditory instructions given occasional mod A to aid focused attention over 3 sessions    Status Partially Met      SLP SHORT TERM GOAL #2   Title Pt will comprehend and demonstrate 1-step directions with 80% accuracy given no more than 3 repetitions over 3 sessions    Baseline 11-02-21    Status Partially Met      SLP SHORT TERM GOAL #3    Title Pt will maintain topic to related question for 30+ seconds given occasional mod A over 3 sessions    Status Not Met      SLP SHORT TERM GOAL #4   Title Pt will use multimodal communication to aid pt's comprehension and attention for 2 functional scenarios    Status Not Met      SLP SHORT TERM GOAL #5   Title Pt will comprehend simple yes/no questions with 90% accuracy with 2 or less repetitions over 2 sessions    Status Partially Met              SLP Long Term Goals - 12/09/21 1321       SLP LONG TERM GOAL #1   Title Pt will comprehend and demonstrate functional 2-step directions with 50% accuracy given no more than 3 repetitions over 2 sessions    Status Not Met      SLP LONG TERM GOAL #2   Title Pt will participate in 30 second to 1 minute conversation of personally relevant topics with no overt shifts in topic over 2 sessions    Status Not Met      SLP LONG TERM GOAL #3   Title Pt will comprehend functional simple to mod complex yes/no questions with 80% accuracy with 2 or less repetitions over 2 sessions    Status Not Met      SLP LONG TERM GOAL #4   Title Pt's wife will report increased comprehension and participation in daily activities with use of learned compensations by last ST session    Status Partially Met      SLP LONG TERM GOAL #5   Title Pt will demo improved speech intelligilbity in simple conversation given occasional mod A over 2 sessions    Status Partially Met              Plan - 12/09/21 1321     Clinical Impression Statement Donald [name] continues to present with severe cognitive communciation impairments, 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 SLP provided communication strateiges to assist with patient comprehension and topic maintenance. Sustained attention, inititation, processing with max A. Pt required short, simple 1-2 word instructions with usual repetition and rephrasing. Occasional eye contact rarely  established with hand off of his head. Further spouse education re: compensations for cognition and communication provided. Pt exhibits maximum potential at this time due to severity of deficits and persistent lethargy/fatigued associated with simple cognitive tasks. Pt's wife aware she may request additional script if improvements in attention and focus indicated.    Speech Therapy Frequency 2x / week    Duration 12 weeks      Treatment/Interventions Compensatory strategies;Functional tasks;Patient/family education;Multimodal communcation approach;Compensatory techniques;Internal/external aids;SLP instruction and feedback    Potential to Achieve Goals Fair    Potential Considerations Severity of impairments;Previous level of function;Ability to learn/carryover information;Cooperation/participation level;Medical prognosis  Consulted and Agree with Plan of Care Patient;Family member/caregiver             Patient will benefit from skilled therapeutic intervention in order to improve the following deficits and impairments:   Cognitive communication deficit  Dysarthria and anarthria  Aphasia    Problem List Patient Active Problem List   Diagnosis Date Noted   At high risk for inadequate nutritional intake 07/12/2021   Transaminitis 06/06/2021   Intraparenchymal hematoma of brain 06/04/2021   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

## 2021-12-09 NOTE — Therapy (Signed)
[facility] [address] [place], [place], [address] Phone: [phone]   Fax:  [phone]  Physical Therapy Treatment  Patient Details  Name: Donald [name] MRN: [id number] Date of Birth: 03-12-1956 Referring Provider (PT): Faith Rogue   Encounter Date: 12/09/2021   PT End of Session - 12/09/21 1235     Visit Number 14    Number of Visits 37    Date for PT Re-Evaluation 02/25/22    Authorization Type [facility] [employer]-60 visit limit (if seen on same day counts as only 1 visit)    Progress Note Due on Visit 20    PT Start Time 1233    PT Stop Time 1315    PT Time Calculation (min) 42 min    Equipment Utilized During Treatment Gait belt    Activity Tolerance Patient limited by fatigue;Patient tolerated treatment well    Behavior During Therapy Flat affect;WFL for tasks assessed/performed             Past Medical History:  Diagnosis Date   Arthritis     Past Surgical History:  Procedure Laterality Date   APPENDECTOMY     IR ANGIO INTRA EXTRACRAN SEL COM CAROTID INNOMINATE BILAT MOD SED  05/28/2021   IR ANGIO VERTEBRAL SEL VERTEBRAL BILAT MOD SED  05/28/2021   IR IVC FILTER PLMT / S&I /IMG GUID/MOD SED  05/27/2021   IR US GUIDE VASC ACCESS RIGHT  05/27/2021    There were no vitals filed for this visit.   Subjective Assessment - 12/09/21 1234     Subjective No pain or falls to report. Continues to work on increased standing in [place]. His son sttood him once with no device. Pt kept his left knee/hip bent the entire time.    Patient is accompained by: Family member    Pertinent History See problem list for PMH    Patient Stated Goals Wife's goal:  get back him to some form of mobility to walk with cane or walker.    Currently in Pain? No/denies    Pain Score 0-No pain                               [facility] Adult PT Treatment/Exercise - 12/09/21 1236        Transfers   Transfers Squat Pivot Transfers;Sit to Stand;Stand to Sit    Sit to Stand 3: Mod assist;With upper extremity assist;From bed;From chair/3-in-1    Stand to Sit 3: Mod assist;With upper extremity assist;To bed;To chair/3-in-1    Squat Pivot Transfers 2: Max Probation officer Details (indicate cue type and reason) from wheelchair>mat table      Neuro Re-ed    Neuro Re-ed Details  for muscle re-ed/postural mobility/balance:                       PT Short Term Goals - 12/03/21 1500       PT SHORT TERM GOAL #1   Title Pt will be able to come forward away from back of w/c to prepare for transfer with CGA and moderate verbal cuing.    Baseline Currently needs max verbal cuing and min assist.    Time 4    Period Weeks    Status New    Target Date 12/31/21      PT SHORT TERM GOAL #2   Title Pt will  perform squat pivot w/c to mat going to the left mod assist for decreased caregiver burden.    Baseline 12/02/21 total asssit    Time 4    Period Weeks    Status New    Target Date 12/31/21      PT SHORT TERM GOAL #3   Title Pt will be able to sit edge of mat > 8 min with mild pertubations for improved sitting balance and trunk control to assist in ADLs.    Baseline Pt was able to sit at EOB with RUE support at S x 3 mins 10/13/21; 11/02/21 Patient able to sit unsupported for >5 min to allow active participation in PT    Time 4    Period Weeks    Status New    Target Date 12/31/21      PT SHORT TERM GOAL #4   Title Pt will increase left hip flexion to >70 degrees to allow for improved trunk flexion to come forward easiser with transfers.    Baseline 12/02/21 left hip flexion 58 degrees.    Time 4    Period Weeks    Status New    Target Date 12/31/21               PT Long Term Goals - 12/03/21 1506       PT LONG TERM GOAL #1   Title Pt's wife will be instructed in stretching, functional strengthening and balance HEP that she can continue  to perform with husband to mazimize his function. (LTGs due 02/25/22)    Baseline 12/02/21 PT continues to add to HEP    Time 12    Period Weeks    Status Revised    Target Date 02/25/22      PT LONG TERM GOAL #2   Title Pt will perform squat pivot transfer w/c to/from mat min assist for improved mobility.    Baseline 12/02/21 squat pivot transfer total assist    Time 12    Period Weeks    Status Revised    Target Date 02/25/22      PT LONG TERM GOAL #3   Title Pt will perform at least 3 minutes of standing at sink or [place], with mod assist, for improved participation in ADLs.    Baseline 12/02/21 total assist to stand in [place]    Time 12    Period Weeks    Status Revised    Target Date 02/25/22      PT LONG TERM GOAL #4   Title Pt will be able to perform bed mobility mod assist for improved mobility and decreased caregiver burden.    Baseline 12/02/21 total assist with sit to/from supine and rolling.    Time 12    Period Weeks    Status New    Target Date 02/25/22      PT LONG TERM GOAL #5   Title Pt will increase left knee extension PROM from lacking 45 degrees to lacking 30 degrees or less for improved mobility to assist with standing.    Baseline 12/02/21 left knee extension lacking 45 degrees.    Time 12    Period Weeks    Status New    Target Date 02/25/22                   Plan - 12/09/21 1236     Personal Factors and Comorbidities Comorbidity 2    Examination-Activity Limitations Locomotion Level;Transfers;Sit;Stand;Dressing;Toileting;Hygiene/Grooming;Bed Mobility;Bathing    Examination-Participation  Restrictions Occupation;Community Activity;Church    Stability/Clinical Decision Making Evolving/Moderate complexity    Rehab Potential Fair    PT Frequency 2x / week    PT Duration 12 weeks    PT Treatment/Interventions ADLs/Self Care Home Management;Functional mobility training;Therapeutic activities;Therapeutic exercise;Balance training;Patient/family  education;Orthotic Fit/Training;Wheelchair mobility training;Manual techniques;Passive range of motion;Neuromuscular re-education;DME Instruction    PT Next Visit Plan . Encourage pt to perform more active movements on own as pushes against resistance. Continue to work on sitting on [place] bench (legs abd on either side) for ant pelvic tilt, trunk dissociation, lying on R side to reduce pusher tendencies and open up left side/ help with neck position.             Patient will benefit from skilled therapeutic intervention in order to improve the following deficits and impairments:  Decreased range of motion, Impaired tone, Difficulty walking, Decreased activity tolerance, Decreased balance, Impaired flexibility, Decreased mobility, Decreased strength, Postural dysfunction  Visit Diagnosis: Muscle weakness (generalized)  Unsteadiness on feet  Other abnormalities of gait and mobility  Abnormal posture     Problem List Patient Active Problem List   Diagnosis Date Noted   At high risk for inadequate nutritional intake 07/12/2021   Transaminitis 06/06/2021   Intraparenchymal hematoma of brain 06/04/2021   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[facility]) 05/30/2021   Right lower lobe pneumonia 05/30/2021   Prediabetes 05/30/2021   Hyperglycemia 05/30/2021   Intracerebral hemorrhage 05/24/2021   Seizure ([facility]) 05/23/2021   Malnutrition of moderate degree 05/22/2021   Memory deficits    OSA (obstructive sleep apnea)    Generalized OA    Hypokalemia    Leukocytosis    Annual physical exam 04/03/2017    Sallyanne Kuster, PTA 12/09/2021, 4:39 PM  [facility] [address] [place], [place], [address] Phone: [phone]   Fax:  [phone]  Name: Demani Mcbrien MRN: [id number] Date of Birth: 1956/10/01

## 2021-12-14 ENCOUNTER — Other Ambulatory Visit: Payer: Self-pay

## 2021-12-14 ENCOUNTER — Ambulatory Visit: Payer: No Typology Code available for payment source | Admitting: Occupational Therapy

## 2021-12-14 ENCOUNTER — Ambulatory Visit
Payer: No Typology Code available for payment source | Attending: Physical Medicine & Rehabilitation | Admitting: Physical Therapy

## 2021-12-14 ENCOUNTER — Encounter: Payer: Self-pay | Admitting: Physical Therapy

## 2021-12-14 DIAGNOSIS — R2681 Unsteadiness on feet: Secondary | ICD-10-CM | POA: Insufficient documentation

## 2021-12-14 DIAGNOSIS — R293 Abnormal posture: Secondary | ICD-10-CM | POA: Diagnosis present

## 2021-12-14 DIAGNOSIS — M25612 Stiffness of left shoulder, not elsewhere classified: Secondary | ICD-10-CM | POA: Diagnosis present

## 2021-12-14 DIAGNOSIS — M6281 Muscle weakness (generalized): Secondary | ICD-10-CM | POA: Insufficient documentation

## 2021-12-14 DIAGNOSIS — M25632 Stiffness of left wrist, not elsewhere classified: Secondary | ICD-10-CM | POA: Diagnosis present

## 2021-12-14 DIAGNOSIS — R278 Other lack of coordination: Secondary | ICD-10-CM | POA: Insufficient documentation

## 2021-12-14 DIAGNOSIS — I69154 Hemiplegia and hemiparesis following nontraumatic intracerebral hemorrhage affecting left non-dominant side: Secondary | ICD-10-CM

## 2021-12-14 DIAGNOSIS — R2689 Other abnormalities of gait and mobility: Secondary | ICD-10-CM

## 2021-12-14 NOTE — Therapy (Addendum)
[facility] [address] [place], [place], [address] Phone: [phone]   Fax:  [phone]  Occupational Therapy Treatment  Patient Details  Name: Donald [name] MRN: [id number] Date of Birth: [birth year] Referring Provider (OT): Dr. Naaman Plummer   Encounter Date: 12/14/2021   OT End of Session - 12/14/21 1357     Visit Number 14    Number of Visits 30    Date for OT Re-Evaluation 02/08/22    Authorization Type [employer]    Authorization Time Period renewed 12/14/21 for 8 weeks    Authorization - Visit Number 14    Progress Note Due on Visit 68    OT Start Time 1020    OT Stop Time 1100    OT Time Calculation (min) 40 min    Activity Tolerance Patient tolerated treatment well             Past Medical History:  Diagnosis Date   Arthritis     Past Surgical History:  Procedure Laterality Date   APPENDECTOMY     IR ANGIO INTRA EXTRACRAN SEL COM CAROTID INNOMINATE BILAT MOD SED  05/28/2021   IR ANGIO VERTEBRAL SEL VERTEBRAL BILAT MOD SED  05/28/2021   IR IVC FILTER PLMT / S&I /IMG GUID/MOD SED  05/27/2021   IR US GUIDE VASC ACCESS RIGHT  05/27/2021    There were no vitals filed for this visit.   Subjective Assessment - 12/14/21 1355     Subjective  reports hand pain    Pertinent History 66 y.o. male hospitlized with  ICH R frontal lobe and small adjacent  SAH, pt transferred to rehab then he exeperienced worsening midline shift of R MCA, R ACA and DVT in RLE    Limitations hx of seizure, no estim without MD clearance, hx of memory deficit OSA    Patient Stated Goals be more independent    Currently in Pain? Yes    Pain Score 4     Pain Location Hand    Pain Orientation Left    Pain Descriptors / Indicators Aching    Pain Type Chronic pain    Pain Onset More than a month ago    Pain Frequency Intermittent    Aggravating Factors  movment    Pain Relieving Factors rest                     Treatment:  Therapist checked progress towards goals with pt/wife.  Pt was transferred to the steadi with min a to pull up to sit on the seat. Pt practiced donning and doffing shirt while in the steadi as this is how pt's wife has him perform. Pt required mod-max assist for donning and doffing. Therapist hd pt dress his left arm first, and pt's wife was educated to perform in this manner to increase pt participation. Therapist reinforced that pt should not attempt dressing in the steadi at home without direct supervision/ assist. Functional reaching with RUE min v.c to reach for specific items, sit to stand with min A, pt pulling up on steadi.               OT Short Term Goals - 12/14/21 1024       OT SHORT TERM GOAL #1   Title Pt's family with be I with inital HEP.    Baseline Min verbal/tactile cues for don/doff    Time --    Period Weeks    Status Achieved  Target Date 10/28/21      OT SHORT TERM GOAL #2   Title Pt's family with be I with splint wear, care and precautions and LUE positioning to minimize pain and contracture.    Time --    Period Weeks    Status Achieved      OT SHORT TERM GOAL #3   Title Pt will consistently wash face and chest with no more than min A.    Time 4    Period Weeks    Status On-going   per wife - washing face not chest     OT SHORT TERM GOAL #4   Title Pt will attend to left hemibody space at least 10% of the time with no more than mod v.c    Time --    Period Weeks    Status Achieved      OT SHORT TERM GOAL #5   Title Pt will assist with pulling down shirt in front at least 25% of them with min v.c    Time 4    Period Weeks    Status On-going   20%     Additional Short Term Goals   Additional Short Term Goals Yes      OT SHORT TERM GOAL #6   Title Pt will follow a simple  1 step command consistently 75% of the time during therapy.    Time 4    Period Weeks    Status On-going   follows 1 step commands inconsistently     OT SHORT TERM  GOAL #7   Title Pt will consistently maintain eyes open for 20 mins during therapy sessions.    Time 4    Period Weeks    Status New               OT Long Term Goals - 12/14/21 1025       OT LONG TERM GOAL #1   Title Pt will donn shirt with mod A, and mod v.c    Time 8    Period Weeks    Status On-going   max A     OT LONG TERM GOAL #2   Title Pt will attend to L hemibody space during ADLs and functional activities at least 25% of the time with min v.c    Time 8    Period Weeks    Status On-going      OT LONG TERM GOAL #3   Title Pt will perfom UB bathing with min A    Revised Pt will perfrom UB bathing with mod A    Time 8    Period Weeks    Status Revised   only washing face and brushing teeth     OT LONG TERM GOAL #4   Title Pt will attend to a simple ADL or functional task for at least 15 mins with no more than min v.c  Revised-Pt will keep his eyes open for at least 30 mins with no more than min prompts.    Time 8    Period Weeks    Status Revised   inconsistent     OT LONG TERM GOAL #5   Title Pt will tolerate P/ROM shoulder flexion to 90* with pain no greater than 2/10 in prep for dressing.    Time 8    Period Weeks    Status On-going   80*     OT LONG TERM GOAL #6   Title Pt will perform  toilet transfers with mod A.    Time 8    Period Weeks    Status On-going   Pt has not been performing regularly                  Plan - 12/14/21 1608     Clinical Impression Statement Patient is progressing towards goals with increasing alertness and participation in therapy.Pt has met some but not all of his short term goals due to the severity of deficits. Pt can benefit from continued skilled occupational therapy to address the following deficits: decreased strength, decreased ROM, abnormal posture, decreased balance, decreased functional mobility, cognitive deficits, spasticity in order to maximize pt's safety and I with daily activities.    OT  Occupational Profile and History Detailed Assessment- Review of Records and additional review of physical, cognitive, psychosocial history related to current functional performance    Occupational performance deficits (Please refer to evaluation for details): ADL's;IADL's;Rest and Sleep;Play;Leisure;Social Participation    Body Structure / Function / Physical Skills ADL;UE functional use;Flexibility;Pain;Vision;FMC;Proprioception;ROM;Gait;Coordination;GMC;Sensation;Decreased knowledge of precautions;Decreased knowledge of use of DME;Dexterity;Mobility;Tone;Strength;IADL    Cognitive Skills Attention;Energy/Drive;Memory;Orientation;Perception;Problem Solve;Safety Awareness;Sequencing;Thought;Understand    Rehab Potential Good    Clinical Decision Making Several treatment options, min-mod task modification necessary    Comorbidities Affecting Occupational Performance: May have comorbidities impacting occupational performance    Modification or Assistance to Complete Evaluation  Min-Moderate modification of tasks or assist with assess necessary to complete eval    OT Frequency 2x / week    OT Duration 8 weeks    OT Treatment/Interventions Self-care/ADL training;Ultrasound;Visual/perceptual remediation/compensation;Patient/family education;Balance training;Passive range of motion;Gait Training;Paraffin;Cryotherapy;Fluidtherapy;Splinting;Therapist, nutritional;Contrast Bath;Moist Heat;Therapeutic exercise;Manual Therapy;Therapeutic activities;Cognitive remediation/compensation;Neuromuscular education    Plan simple ADLs, continue NMR - Postural control, weight shifting forward and left    Consulted and Agree with Plan of Care Patient;Family member/caregiver             Patient will benefit from skilled therapeutic intervention in order to improve the following deficits and impairments:   Body Structure / Function / Physical Skills: ADL, UE functional use, Flexibility, Pain, Vision, FMC,  Proprioception, ROM, Gait, Coordination, GMC, Sensation, Decreased knowledge of precautions, Decreased knowledge of use of DME, Dexterity, Mobility, Tone, Strength, IADL Cognitive Skills: Attention, Energy/Drive, Memory, Orientation, Perception, Problem Solve, Safety Awareness, Sequencing, Thought, Understand     Visit Diagnosis: Muscle weakness (generalized) - Plan: Ot plan of care cert/re-cert  Unsteadiness on feet - Plan: Ot plan of care cert/re-cert  Abnormal posture - Plan: Ot plan of care cert/re-cert  Hemiplegia and hemiparesis following nontraumatic intracerebral hemorrhage affecting left non-dominant side ([place]) - Plan: Ot plan of care cert/re-cert  Stiffness of left wrist, not elsewhere classified - Plan: Ot plan of care cert/re-cert  Other lack of coordination - Plan: Ot plan of care cert/re-cert  Other abnormalities of gait and mobility - Plan: Ot plan of care cert/re-cert  Stiffness of left shoulder, not elsewhere classified - Plan: Ot plan of care cert/re-cert    Problem List Patient Active Problem List   Diagnosis Date Noted   At high risk for inadequate nutritional intake 07/12/2021   Transaminitis 06/06/2021   Intraparenchymal hematoma of brain 06/04/2021   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[place]) 05/30/2021   Right lower lobe pneumonia 05/30/2021   Prediabetes 05/30/2021   Hyperglycemia 05/30/2021   Intracerebral hemorrhage 05/24/2021   Seizure ([place]) 05/23/2021   Malnutrition of moderate degree 05/22/2021   Memory deficits    OSA (obstructive sleep apnea)    Generalized OA  Hypokalemia    Leukocytosis    Annual physical exam 04/03/2017    Shawn Dannenberg, OT 12/15/2021, 1:50 PM  [facility] [address] [place], [place], [address] Phone: [phone]   Fax:  [phone]  Name: Donald [name] MRN: [id number] Date of Birth: October 31, 1956

## 2021-12-15 NOTE — Therapy (Signed)
[facility] [address] [place], [place], [address] Phone: [phone]   Fax:  [phone]  Physical Therapy Treatment  Patient Details  Name: Donald [name] MRN: [id number] Date of Birth: 07/11/1956 Referring Provider (PT): Faith RogueSwartz, Zachary   Encounter Date: 12/14/2021   PT End of Session - 12/14/21 1630     Visit Number 15    Number of Visits 37    Date for PT Re-Evaluation 02/25/22    Authorization Type [facility] [employer]-60 visit limit (if seen on same day counts as only 1 visit)    Progress Note Due on Visit 20    PT Start Time 1102    PT Stop Time 1145    PT Time Calculation (min) 43 min    Equipment Utilized During Treatment Gait belt    Activity Tolerance Patient limited by fatigue;Patient tolerated treatment well    Behavior During Therapy Flat affect;WFL for tasks assessed/performed             Past Medical History:  Diagnosis Date   Arthritis     Past Surgical History:  Procedure Laterality Date   APPENDECTOMY     IR ANGIO INTRA EXTRACRAN SEL COM CAROTID INNOMINATE BILAT MOD SED  05/28/2021   IR ANGIO VERTEBRAL SEL VERTEBRAL BILAT MOD SED  05/28/2021   IR IVC FILTER PLMT / S&I /IMG GUID/MOD SED  05/27/2021   IR US GUIDE VASC ACCESS RIGHT  05/27/2021    There were no vitals filed for this visit.   Subjective Assessment - 12/14/21 1101     Subjective No new complaints. No falls or pain to report.    Patient is accompained by: Family member    Pertinent History See problem list for PMH    Patient Stated Goals Wife's goal:  get back him to some form of mobility to walk with cane or walker.    Currently in Pain? No/denies                     [facility]Exercise - 12/14/21 1101       Transfers   Transfers Sit to Stand;Stand to Sit    Sit to Stand 3: Mod assist;With upper extremity assist;From bed;From chair/3-in-1    Sit to Stand Details (indicate  cue type and reason) with Stedy: mod assist with cues from mat table; min guard to min assist from lift pads for several reps.    Stand to Sit 3: Mod assist;With upper extremity assist;To bed;To chair/3-in-1    Stand to Sit Details with Stedy: min guard to min assist to lift pads. mod assist for controlled descent to wheelchair    Squat Pivot Transfers 1: +1 Total assist    Squat Pivot Transfer Details (indicate cue type and reason) use of Stedy lift assist to transfer from bed to wheelchair      Neuro Re-ed    Neuro Re-ed Details  for muscle re-ed/postural mobility/balance: pt on mat table post OT session. assisted pt to go from sitting edge of mat to supine on mat table with mod/max assist/cues on technique. in supine with 2 pillows under pt to promote increased cervical extension. passive stretching of left LE into extension with prolonged holds at knee into extension. pt able to extend right LE with cues. max assist with cues/facilitation to roll onto right side. lying on right side with single pillow to allow for cervical lateral flexion stretch as pt relaxes. pt with tendency  to use right hand to hold head up, therefore engaged right hand in manual task of squeezing foam ball while in side lying with reminder cues to task needed. passively working on left LE extension while in sidelying, then pelvic mobs in inferior/superior directions with pt assisting at times after cued. max assist to return to supine. Briefly stayed on supine to attempt to work on bil LE ROM: with PTA holding both feet pt able to flex knees/then extend knees with right assist left LE movements. attempted to place feet on red pball with cues to roll the ball, however pt kept bringing foot off ball. rolling into left sidelying with max assist. max to total assist for sidelying to sitting edge of mat. max/total assist to scoot fully to edge of mat for feet to touch floor. once at edge of mat close supervision for unsupport sitting with  cues to "look up" needed. max/total assist for lateral elbow propping<>short sitting at EOB for 5 reps each side. Increased assist/cues/facilitation needed with going down toward left side with pt stating "slow down now". Once in left elbow propping cues/faciliation needed to engage with pt to decrease anxiety with this position. ended with standing in Stedy to work on standing posture/tolerance by engaging pt with writing on mirror in front of him with dry erase marker. worked on [employer] operational officer pt's name and tic-tac-toe with cues to work higher on mirrow for increased upright posture.                       PT Short Term Goals - 12/03/21 1500       PT SHORT TERM GOAL #1   Title Pt will be able to come forward away from back of w/c to prepare for transfer with CGA and moderate verbal cuing.    Baseline Currently needs max verbal cuing and min assist.    Time 4    Period Weeks    Status New    Target Date 12/31/21      PT SHORT TERM GOAL #2   Title Pt will perform squat pivot w/c to mat going to the left mod assist for decreased caregiver burden.    Baseline 12/02/21 total asssit    Time 4    Period Weeks    Status New    Target Date 12/31/21      PT SHORT TERM GOAL #3   Title Pt will be able to sit edge of mat > 8 min with mild pertubations for improved sitting balance and trunk control to assist in ADLs.    Baseline Pt was able to sit at EOB with RUE support at S x 3 mins 10/13/21; 11/02/21 Patient able to sit unsupported for >5 min to allow active participation in PT    Time 4    Period Weeks    Status New    Target Date 12/31/21      PT SHORT TERM GOAL #4   Title Pt will increase left hip flexion to >70 degrees to allow for improved trunk flexion to come forward easiser with transfers.    Baseline 12/02/21 left hip flexion 58 degrees.    Time 4    Period Weeks    Status New    Target Date 12/31/21               PT Long Term Goals - 12/03/21 1506       PT LONG  TERM GOAL #1   Title Pt's wife  will be instructed in stretching, functional strengthening and balance HEP that she can continue to perform with husband to mazimize his function. (LTGs due 02/25/22)    Baseline 12/02/21 PT continues to add to HEP    Time 12    Period Weeks    Status Revised    Target Date 02/25/22      PT LONG TERM GOAL #2   Title Pt will perform squat pivot transfer w/c to/from mat min assist for improved mobility.    Baseline 12/02/21 squat pivot transfer total assist    Time 12    Period Weeks    Status Revised    Target Date 02/25/22      PT LONG TERM GOAL #3   Title Pt will perform at least 3 minutes of standing at sink or Stedy, with mod assist, for improved participation in ADLs.    Baseline 12/02/21 total assist to stand in [place]    Time 12    Period Weeks    Status Revised    Target Date 02/25/22      PT LONG TERM GOAL #4   Title Pt will be able to perform bed mobility mod assist for improved mobility and decreased caregiver burden.    Baseline 12/02/21 total assist with sit to/from supine and rolling.    Time 12    Period Weeks    Status New    Target Date 02/25/22      PT LONG TERM GOAL #5   Title Pt will increase left knee extension PROM from lacking 45 degrees to lacking 30 degrees or less for improved mobility to assist with standing.    Baseline 12/02/21 left knee extension lacking 45 degrees.    Time 12    Period Weeks    Status New    Target Date 02/25/22                   Plan - 12/14/21 1630     Clinical Impression Statement Today's skilled session continued to focus on stretching, positioning to decrease pusher tendencies, sitting balance/posture and transfers with [facility] lift assist. Pt needing cues to open eyes only once with session. Pt tends to move more when allowed to do so himself, otherwise resists movements. The pt should benefit from continued PT to continue towards unmet goals.    Personal Factors and Comorbidities  Comorbidity 2    Examination-Activity Limitations Locomotion Level;Transfers;Sit;Stand;Dressing;Toileting;Hygiene/Grooming;Bed Mobility;Bathing    Examination-Participation Restrictions Occupation;Community Activity;Church    Stability/Clinical Decision Making Evolving/Moderate complexity    Rehab Potential Fair    PT Frequency 2x / week    PT Duration 12 weeks    PT Treatment/Interventions ADLs/Self Care Home Management;Functional mobility training;Therapeutic activities;Therapeutic exercise;Balance training;Patient/family education;Orthotic Fit/Training;Wheelchair mobility training;Manual techniques;Passive range of motion;Neuromuscular re-education;DME Instruction    PT Next Visit Plan . Encourage pt to perform more active movements on own as pushes against resistance. Continue to work on sitting on [place] bench (legs abd on either side) for ant pelvic tilt, trunk dissociation, lying on R side to reduce pusher tendencies and open up left side/ help with neck position.             Patient will benefit from skilled therapeutic intervention in order to improve the following deficits and impairments:  Decreased range of motion, Impaired tone, Difficulty walking, Decreased activity tolerance, Decreased balance, Impaired flexibility, Decreased mobility, Decreased strength, Postural dysfunction  Visit Diagnosis: Muscle weakness (generalized)  Unsteadiness on feet  Other abnormalities of gait  and mobility  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)  Abnormal posture     Problem List Patient Active Problem List   Diagnosis Date Noted   At high risk for inadequate nutritional intake 07/12/2021   Transaminitis 06/06/2021   Intraparenchymal hematoma of brain 06/04/2021   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[facility]) 05/30/2021   Right lower lobe pneumonia 05/30/2021   Prediabetes 05/30/2021   Hyperglycemia 05/30/2021   Intracerebral  hemorrhage 05/24/2021   Seizure ([facility]) 05/23/2021   Malnutrition of moderate degree 05/22/2021   Memory deficits    OSA (obstructive sleep apnea)    Generalized OA    Hypokalemia    Leukocytosis    Annual physical exam 04/03/2017    Sallyanne KusterKathy Tahliyah Anagnos, PTA, [facility] [address], [address] [place], [place] [address] [phone] 12/15/21, 2:10 PM   Name: Donald [name] MRN: [id number] Date of Birth: 12/30/1955

## 2021-12-15 NOTE — Addendum Note (Signed)
Addended by: Keene Breath B on: 12/15/2021 01:51 PM   Modules accepted: Orders

## 2021-12-16 ENCOUNTER — Encounter: Payer: Self-pay | Admitting: Occupational Therapy

## 2021-12-16 ENCOUNTER — Other Ambulatory Visit: Payer: Self-pay

## 2021-12-16 ENCOUNTER — Ambulatory Visit: Payer: No Typology Code available for payment source | Admitting: Occupational Therapy

## 2021-12-16 DIAGNOSIS — R2681 Unsteadiness on feet: Secondary | ICD-10-CM

## 2021-12-16 DIAGNOSIS — I69154 Hemiplegia and hemiparesis following nontraumatic intracerebral hemorrhage affecting left non-dominant side: Secondary | ICD-10-CM

## 2021-12-16 DIAGNOSIS — R278 Other lack of coordination: Secondary | ICD-10-CM

## 2021-12-16 DIAGNOSIS — M6281 Muscle weakness (generalized): Secondary | ICD-10-CM

## 2021-12-16 DIAGNOSIS — M25632 Stiffness of left wrist, not elsewhere classified: Secondary | ICD-10-CM

## 2021-12-16 DIAGNOSIS — M25612 Stiffness of left shoulder, not elsewhere classified: Secondary | ICD-10-CM

## 2021-12-16 DIAGNOSIS — R293 Abnormal posture: Secondary | ICD-10-CM

## 2021-12-16 NOTE — Therapy (Signed)
[facility] [address] [place], [place], [address] Phone: [phone]   Fax:  [phone]  Occupational Therapy Treatment  Patient Details  Name: Donald [name] MRN: [id number] Date of Birth: 02/27/1956 Referring Provider (OT): Dr. Riley KillSwartz   Encounter Date: 12/16/2021   OT End of Session - 12/16/21 1600     Visit Number 15    Number of Visits 30    Date for OT Re-Evaluation 02/08/22    Authorization Type U[employer]    Authorization Time Period renewed 12/14/21 for 8 weeks    Authorization - Visit Number 15    Progress Note Due on Visit 19    OT Start Time 1445    OT Stop Time 1530    OT Time Calculation (min) 45 min    Activity Tolerance Patient tolerated treatment well    Behavior During Therapy [facility] for tasks assessed/performed             Past Medical History:  Diagnosis Date   Arthritis     Past Surgical History:  Procedure Laterality Date   APPENDECTOMY     IR ANGIO INTRA EXTRACRAN SEL COM CAROTID INNOMINATE BILAT MOD SED  05/28/2021   IR ANGIO VERTEBRAL SEL VERTEBRAL BILAT MOD SED  05/28/2021   IR IVC FILTER PLMT / S&I /IMG GUID/MOD SED  05/27/2021   IR US GUIDE VASC ACCESS RIGHT  05/27/2021    There were no vitals filed for this visit.   Subjective Assessment - 12/16/21 1553     Subjective  Today is patient's birthday.  "It is a very special day!"    Patient is accompanied by: Family member    Pertinent History 66 y.o. male hospitlized with  ICH R frontal lobe and small adjacent  SAH, pt transferred to rehab then he exeperienced worsening midline shift of R MCA, R ACA and DVT in RLE    Limitations hx of seizure, no estim without MD clearance, hx of memory deficit OSA    Patient Stated Goals be more independent    Currently in Pain? No/denies    Pain Score 0-No pain                          OT Treatments/Exercises (OP) - 12/16/21 0001       ADLs   UB Dressing Follow up with  patient's wife.  After last OT session where UB Dressing was practiced - wife indicates that tips were helpful and patient was able to participate more in putting on his t-shirt.  She indicated that long sleeved shirt was more challenging.    Functional Mobility Continue to work to address level surface transfers - and components required.  Patient better able to shift mass forward, despite limited hip range of motion. Patient better able to accept weight onto feet.  Patient with very limited trunk mobility, especially trunk lateral flexion on right.  Patient has more difficulty with rotational and lateral componenets of transitional movements.  In unsupported sitting position - worked on lateral flexion to right side, tipping right ear toward right shoulder and resting on right forearm on mat table.  Worked on components of sit to partial stand, and improving hip and knee flexion as well as trunk alignment in standing.  Patient maintained eyes open entire session - thus earning both cake and ice cream for his birthday treat!  OT Short Term Goals - 12/16/21 1603       OT SHORT TERM GOAL #1   Title Pt's family with be I with inital HEP.    Baseline Min verbal/tactile cues for don/doff    Period Weeks    Status Achieved    Target Date 10/28/21      OT SHORT TERM GOAL #2   Title Pt's family with be I with splint wear, care and precautions and LUE positioning to minimize pain and contracture.    Period Weeks    Status Achieved      OT SHORT TERM GOAL #3   Title Pt will consistently wash face and chest with no more than min A.    Time 4    Period Weeks    Status On-going   per wife - washing face not chest     OT SHORT TERM GOAL #4   Title Pt will attend to left hemibody space at least 10% of the time with no more than mod v.c    Period Weeks    Status Achieved      OT SHORT TERM GOAL #5   Title Pt will assist with pulling down shirt in front at least 25% of  them with min v.c    Time 4    Period Weeks    Status On-going   20%     OT SHORT TERM GOAL #6   Title Pt will follow a simple  1 step command consistently 75% of the time during therapy.    Time 4    Period Weeks    Status On-going   follows 1 step commands inconsistently     OT SHORT TERM GOAL #7   Title Pt will consistently maintain eyes open for 20 mins during therapy sessions.    Time 4    Period Weeks    Status New               OT Long Term Goals - 12/16/21 1603       OT LONG TERM GOAL #1   Title Pt will donn shirt with mod A, and mod v.c    Time 8    Period Weeks    Status On-going   max A     OT LONG TERM GOAL #2   Title Pt will attend to L hemibody space during ADLs and functional activities at least 25% of the time with min v.c    Time 8    Period Weeks    Status On-going      OT LONG TERM GOAL #3   Title Pt will perfom UB bathing with min A    Revised Pt will perfrom UB bathing with mod A    Time 8    Period Weeks    Status Revised   only washing face and brushing teeth     OT LONG TERM GOAL #4   Title Pt will attend to a simple ADL or functional task for at least 15 mins with no more than min v.c  Revised-Pt will keep his eyes open for at least 30 mins with no more than min prompts.    Time 8    Period Weeks    Status Revised   inconsistent     OT LONG TERM GOAL #5   Title Pt will tolerate P/ROM shoulder flexion to 90* with pain no greater than 2/10 in prep for dressing.    Time 8  Period Weeks    Status On-going   80*     OT LONG TERM GOAL #6   Title Pt will perform toilet transfers with mod A.    Time 8    Period Weeks    Status On-going   Pt has not been performing regularly                  Plan - 12/16/21 1602     Clinical Impression Statement Patient is beginning to participate more routinely in simple dressing and bathing tasks at home.  Continue to work to improve postural mobility and control for improved transitional  movement, working also to improve active alertness and participation in rehab process.    OT Occupational Profile and History Detailed Assessment- Review of Records and additional review of physical, cognitive, psychosocial history related to current functional performance    Occupational performance deficits (Please refer to evaluation for details): ADL's;IADL's;Rest and Sleep;Play;Leisure;Social Participation    Body Structure / Function / Physical Skills ADL;UE functional use;Flexibility;Pain;Vision;FMC;Proprioception;ROM;Gait;Coordination;GMC;Sensation;Decreased knowledge of precautions;Decreased knowledge of use of DME;Dexterity;Mobility;Tone;Strength;IADL    Cognitive Skills Attention;Energy/Drive;Memory;Orientation;Perception;Problem Solve;Safety Awareness;Sequencing;Thought;Understand    Rehab Potential Good    Clinical Decision Making Several treatment options, min-mod task modification necessary    Comorbidities Affecting Occupational Performance: May have comorbidities impacting occupational performance    Modification or Assistance to Complete Evaluation  Min-Moderate modification of tasks or assist with assess necessary to complete eval    OT Frequency 2x / week    OT Duration 8 weeks    OT Treatment/Interventions Self-care/ADL training;Ultrasound;Visual/perceptual remediation/compensation;Patient/family education;Balance training;Passive range of motion;Gait Training;Paraffin;Cryotherapy;Fluidtherapy;Splinting;Building services engineer;Contrast Bath;Moist Heat;Therapeutic exercise;Manual Therapy;Therapeutic activities;Cognitive remediation/compensation;Neuromuscular education    Plan simple ADLs, continue NMR - Postural control, weight shifting forward and left    Consulted and Agree with Plan of Care Patient;Family member/caregiver             Patient will benefit from skilled therapeutic intervention in order to improve the following deficits and impairments:   Body Structure  / Function / Physical Skills: ADL, UE functional use, Flexibility, Pain, Vision, FMC, Proprioception, ROM, Gait, Coordination, GMC, Sensation, Decreased knowledge of precautions, Decreased knowledge of use of DME, Dexterity, Mobility, Tone, Strength, IADL Cognitive Skills: Attention, Energy/Drive, Memory, Orientation, Perception, Problem Solve, Safety Awareness, Sequencing, Thought, Understand     Visit Diagnosis: 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)  Abnormal posture  Unsteadiness on feet  Muscle weakness (generalized)  Stiffness of left wrist, not elsewhere classified  Other lack of coordination  Stiffness of left shoulder, not elsewhere classified    Problem List Patient Active Problem List   Diagnosis Date Noted   At high risk for inadequate nutritional intake 07/12/2021   Transaminitis 06/06/2021   Intraparenchymal hematoma of brain 06/04/2021   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HCC) 05/30/2021   Right lower lobe pneumonia 05/30/2021   Prediabetes 05/30/2021   Hyperglycemia 05/30/2021   Intracerebral hemorrhage 05/24/2021   Seizure (HCC) 05/23/2021   Malnutrition of moderate degree 05/22/2021   Memory deficits    OSA (obstructive sleep apnea)    Generalized OA    Hypokalemia    Leukocytosis    Annual physical exam 04/03/2017    Collier Salina, OT 12/16/2021, 4:04 PM  [facility] [address] [place], [place], [address] Phone: [phone]   Fax:  [phone]  Name: Alejandra Barna MRN: [id number] Date of Birth: 10/17/1956

## 2021-12-21 ENCOUNTER — Encounter: Payer: Self-pay | Admitting: Occupational Therapy

## 2021-12-21 ENCOUNTER — Ambulatory Visit: Payer: No Typology Code available for payment source | Admitting: Rehabilitation

## 2021-12-21 ENCOUNTER — Ambulatory Visit: Payer: No Typology Code available for payment source | Admitting: Occupational Therapy

## 2021-12-21 ENCOUNTER — Other Ambulatory Visit: Payer: Self-pay

## 2021-12-21 DIAGNOSIS — M6281 Muscle weakness (generalized): Secondary | ICD-10-CM | POA: Diagnosis not present

## 2021-12-21 DIAGNOSIS — M25632 Stiffness of left wrist, not elsewhere classified: Secondary | ICD-10-CM

## 2021-12-21 DIAGNOSIS — I69154 Hemiplegia and hemiparesis following nontraumatic intracerebral hemorrhage affecting left non-dominant side: Secondary | ICD-10-CM

## 2021-12-21 DIAGNOSIS — R293 Abnormal posture: Secondary | ICD-10-CM

## 2021-12-21 DIAGNOSIS — M25612 Stiffness of left shoulder, not elsewhere classified: Secondary | ICD-10-CM

## 2021-12-21 DIAGNOSIS — R278 Other lack of coordination: Secondary | ICD-10-CM

## 2021-12-21 DIAGNOSIS — R2681 Unsteadiness on feet: Secondary | ICD-10-CM

## 2021-12-21 NOTE — Therapy (Signed)
[facility] [address] [place], [place], [address] Phone: [phone]   Fax:  [phone]  Occupational Therapy Treatment  Patient Details  Name: Donald [name] MRN: [id number] Date of Birth: Sep 21, 1956 Referring Provider (OT): Dr. Riley Kill   Encounter Date: 12/21/2021   OT End of Session - 12/21/21 1343     Visit Number 16    Number of Visits 30    Date for OT Re-Evaluation 02/08/22    Authorization Type UHC    Authorization Time Period renewed 12/14/21 for 8 weeks    Authorization - Visit Number 16    Progress Note Due on Visit 19    OT Start Time 1230    OT Stop Time 1315    OT Time Calculation (min) 45 min    Activity Tolerance Patient tolerated treatment well    Behavior During Therapy [facility] for tasks assessed/performed             Past Medical History:  Diagnosis Date   Arthritis     Past Surgical History:  Procedure Laterality Date   APPENDECTOMY     IR ANGIO INTRA EXTRACRAN SEL COM CAROTID INNOMINATE BILAT MOD SED  05/28/2021   IR ANGIO VERTEBRAL SEL VERTEBRAL BILAT MOD SED  05/28/2021   IR IVC FILTER PLMT / S&I /IMG GUID/MOD SED  05/27/2021   IR US GUIDE VASC ACCESS RIGHT  05/27/2021    There were no vitals filed for this visit.   Subjective Assessment - 12/21/21 1333     Subjective  "Wonderful" - when asked how he was doing    Patient is accompanied by: Family member    Pertinent History 66 y.o. male hospitlized with  ICH R frontal lobe and small adjacent  SAH, pt transferred to rehab then he exeperienced worsening midline shift of R MCA, R ACA and DVT in RLE    Limitations hx of seizure, no estim without MD clearance, hx of memory deficit OSA    Patient Stated Goals be more independent    Currently in Pain? No/denies    Pain Score 0-No pain                          OT Treatments/Exercises (OP) - 12/21/21 0001       Neurological Re-education Exercises   Other Exercises 1  Neuromuscular reeducation to address trunk mobility, postural control, and transitional movement sit to partial stand, stand to sit, weight shift forward in sitting.  Patient with improved alertness this session.  Able to transition out of wheelchair into standard chair with arms.  Patient worked on stand to sit in this chair with more emphasis on flexion.  Patient has very limited pelvis motion/ hip motion.  Worked on flexing trunk forward - and stretching LUE forward in reach pattern of shoulder flexion with elbow extension.  Patient actively attending to task for 3-5 seconds at a time.  Patient attends to people for longer periods of time.                      OT Short Term Goals - 12/21/21 1344       OT SHORT TERM GOAL #1   Title Pt's family with be I with inital HEP.    Baseline Min verbal/tactile cues for don/doff    Period Weeks    Status Achieved    Target Date 10/28/21      OT SHORT TERM GOAL #  2   Title Pt's family with be I with splint wear, care and precautions and LUE positioning to minimize pain and contracture.    Period Weeks    Status Achieved      OT SHORT TERM GOAL #3   Title Pt will consistently wash face and chest with no more than min A.    Time 4    Period Weeks    Status On-going   per wife - washing face not chest     OT SHORT TERM GOAL #4   Title Pt will attend to left hemibody space at least 10% of the time with no more than mod v.c    Period Weeks    Status Achieved      OT SHORT TERM GOAL #5   Title Pt will assist with pulling down shirt in front at least 25% of them with min v.c    Time 4    Period Weeks    Status On-going   20%     OT SHORT TERM GOAL #6   Title Pt will follow a simple  1 step command consistently 75% of the time during therapy.    Time 4    Period Weeks    Status On-going   follows 1 step commands inconsistently     OT SHORT TERM GOAL #7   Title Pt will consistently maintain eyes open for 20 mins during therapy  sessions.    Time 4    Period Weeks    Status New               OT Long Term Goals - 12/21/21 1344       OT LONG TERM GOAL #1   Title Pt will donn shirt with mod A, and mod v.c    Time 8    Period Weeks    Status On-going   max A     OT LONG TERM GOAL #2   Title Pt will attend to L hemibody space during ADLs and functional activities at least 25% of the time with min v.c    Time 8    Period Weeks    Status On-going      OT LONG TERM GOAL #3   Title Pt will perfom UB bathing with min A    Revised Pt will perfrom UB bathing with mod A    Time 8    Period Weeks    Status Revised   only washing face and brushing teeth     OT LONG TERM GOAL #4   Title Pt will attend to a simple ADL or functional task for at least 15 mins with no more than min v.c  Revised-Pt will keep his eyes open for at least 30 mins with no more than min prompts.    Time 8    Period Weeks    Status Revised   inconsistent     OT LONG TERM GOAL #5   Title Pt will tolerate P/ROM shoulder flexion to 90* with pain no greater than 2/10 in prep for dressing.    Time 8    Period Weeks    Status On-going   80*     OT LONG TERM GOAL #6   Title Pt will perform toilet transfers with mod A.    Time 8    Period Weeks    Status On-going   Pt has not been performing regularly  Plan - 12/21/21 1343     Clinical Impression Statement Patient showing improved arousal and alertness during last two therapy sessions.    OT Occupational Profile and History Detailed Assessment- Review of Records and additional review of physical, cognitive, psychosocial history related to current functional performance    Occupational performance deficits (Please refer to evaluation for details): ADL's;IADL's;Rest and Sleep;Play;Leisure;Social Participation    Body Structure / Function / Physical Skills ADL;UE functional  use;Flexibility;Pain;Vision;FMC;Proprioception;ROM;Gait;Coordination;GMC;Sensation;Decreased knowledge of precautions;Decreased knowledge of use of DME;Dexterity;Mobility;Tone;Strength;IADL    Cognitive Skills Attention;Energy/Drive;Memory;Orientation;Perception;Problem Solve;Safety Awareness;Sequencing;Thought;Understand    Rehab Potential Good    Clinical Decision Making Several treatment options, min-mod task modification necessary    Comorbidities Affecting Occupational Performance: May have comorbidities impacting occupational performance    Modification or Assistance to Complete Evaluation  Min-Moderate modification of tasks or assist with assess necessary to complete eval    OT Frequency 2x / week    OT Duration 8 weeks    OT Treatment/Interventions Self-care/ADL training;Ultrasound;Visual/perceptual remediation/compensation;Patient/family education;Balance training;Passive range of motion;Gait Training;Paraffin;Cryotherapy;Fluidtherapy;Splinting;Building services engineerunctional Mobility Training;Contrast Bath;Moist Heat;Therapeutic exercise;Manual Therapy;Therapeutic activities;Cognitive remediation/compensation;Neuromuscular education    Plan simple ADLs, continue NMR - Postural control, weight shifting forward and left    Consulted and Agree with Plan of Care Patient;Family member/caregiver             Patient will benefit from skilled therapeutic intervention in order to improve the following deficits and impairments:   Body Structure / Function / Physical Skills: ADL, UE functional use, Flexibility, Pain, Vision, FMC, Proprioception, ROM, Gait, Coordination, GMC, Sensation, Decreased knowledge of precautions, Decreased knowledge of use of DME, Dexterity, Mobility, Tone, Strength, IADL Cognitive Skills: Attention, Energy/Drive, Memory, Orientation, Perception, Problem Solve, Safety Awareness, Sequencing, Thought, Understand     Visit Diagnosis: 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)  Abnormal posture  Unsteadiness on feet  Muscle weakness (generalized)  Stiffness of left wrist, not elsewhere classified  Other lack of coordination  Stiffness of left shoulder, not elsewhere classified    Problem List Patient Active Problem List   Diagnosis Date Noted   At high risk for inadequate nutritional intake 07/12/2021   Transaminitis 06/06/2021   Intraparenchymal hematoma of brain 06/04/2021   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HCC) 05/30/2021   Right lower lobe pneumonia 05/30/2021   Prediabetes 05/30/2021   Hyperglycemia 05/30/2021   Intracerebral hemorrhage 05/24/2021   Seizure (HCC) 05/23/2021   Malnutrition of moderate degree 05/22/2021   Memory deficits    OSA (obstructive sleep apnea)    Generalized OA    Hypokalemia    Leukocytosis    Annual physical exam 04/03/2017    Collier SalinaGellert, Suraj Ramdass M, OT 12/21/2021, 1:45 PM  [facility] [address] [place], [place], [address] Phone: [phone]   Fax:  [phone]  Name: Donald [name] MRN: [id number] Date of Birth: 04/03/1956

## 2021-12-22 ENCOUNTER — Encounter: Payer: Self-pay | Admitting: Occupational Therapy

## 2021-12-22 ENCOUNTER — Ambulatory Visit: Payer: No Typology Code available for payment source | Admitting: Occupational Therapy

## 2021-12-22 VITALS — BP 99/68

## 2021-12-22 DIAGNOSIS — M25632 Stiffness of left wrist, not elsewhere classified: Secondary | ICD-10-CM

## 2021-12-22 DIAGNOSIS — R293 Abnormal posture: Secondary | ICD-10-CM

## 2021-12-22 DIAGNOSIS — M6281 Muscle weakness (generalized): Secondary | ICD-10-CM | POA: Diagnosis not present

## 2021-12-22 DIAGNOSIS — R278 Other lack of coordination: Secondary | ICD-10-CM

## 2021-12-22 DIAGNOSIS — I69154 Hemiplegia and hemiparesis following nontraumatic intracerebral hemorrhage affecting left non-dominant side: Secondary | ICD-10-CM

## 2021-12-22 DIAGNOSIS — M25612 Stiffness of left shoulder, not elsewhere classified: Secondary | ICD-10-CM

## 2021-12-22 DIAGNOSIS — R2681 Unsteadiness on feet: Secondary | ICD-10-CM

## 2021-12-22 NOTE — Therapy (Signed)
[facility] [address] [place], [place], [address] Phone: [phone]   Fax:  [phone]  Occupational Therapy Treatment  Patient Details  Name: Donald [name] MRN: [id number] Date of Birth: 04/26/1956 Referring Provider (OT): Dr. Riley KillSwartz   Encounter Date: 12/22/2021   OT End of Session - 12/22/21 1339     Visit Number 17    Number of Visits 30    Date for OT Re-Evaluation 02/08/22    Authorization Type [employer]    Authorization Time Period renewed 12/14/21 for 8 weeks    Authorization - Visit Number 17    Progress Note Due on Visit 19    OT Start Time 1228    OT Stop Time 1315    OT Time Calculation (min) 47 min    Activity Tolerance Other (comment)   limited by confusion            Past Medical History:  Diagnosis Date   Arthritis     Past Surgical History:  Procedure Laterality Date   APPENDECTOMY     IR ANGIO INTRA EXTRACRAN SEL COM CAROTID INNOMINATE BILAT MOD SED  05/28/2021   IR ANGIO VERTEBRAL SEL VERTEBRAL BILAT MOD SED  05/28/2021   IR IVC FILTER PLMT / S&I /IMG GUID/MOD SED  05/27/2021   IR US GUIDE VASC ACCESS RIGHT  05/27/2021    Vitals:   12/22/21 1326  BP: 99/68     Subjective Assessment - 12/22/21 1326     Subjective  Such beautiful people    Patient is accompanied by: Family member    Pertinent History 66 y.o. male hospitlized with  ICH R frontal lobe and small adjacent  SAH, pt transferred to rehab then he exeperienced worsening midline shift of R MCA, R ACA and DVT in RLE    Limitations hx of seizure, no estim without MD clearance, hx of memory deficit OSA    Patient Stated Goals be more independent    Currently in Pain? No/denies    Pain Score 0-No pain                          OT Treatments/Exercises (OP) - 12/22/21 0001       Neurological Re-education Exercises   Other Exercises 1 Neuromuscular reeducation to address trunk mobility.  Patient with severely  limited trunk movement.  Maintains flexed posture with lateral curve - very forward, stiff head.  Transferred to mat with max assist.  Sits with full posterior rotation of lelvis - worked to achieve lateral flexion on right side.  Needs increased time with all transitions. Transitioned to sidelying then supine with total assist.  Worked on counterotation and rotation in trunk supine to sidelying, to right and left.  Side sitting to address lateral flexion (propped on therapists thighs)                      OT Short Term Goals - 12/22/21 1349       OT SHORT TERM GOAL #1   Title Pt's family with be I with inital HEP.    Baseline Min verbal/tactile cues for don/doff    Period Weeks    Status Achieved    Target Date 10/28/21      OT SHORT TERM GOAL #2   Title Pt's family with be I with splint wear, care and precautions and LUE positioning to minimize pain and contracture.    Period Weeks  Status Achieved      OT SHORT TERM GOAL #3   Title Pt will consistently wash face and chest with no more than min A.    Time 4    Period Weeks    Status On-going   per wife - washing face not chest     OT SHORT TERM GOAL #4   Title Pt will attend to left hemibody space at least 10% of the time with no more than mod v.c    Period Weeks    Status Achieved      OT SHORT TERM GOAL #5   Title Pt will assist with pulling down shirt in front at least 25% of them with min v.c    Time 4    Period Weeks    Status On-going   20%     OT SHORT TERM GOAL #6   Title Pt will follow a simple  1 step command consistently 75% of the time during therapy.    Time 4    Period Weeks    Status On-going   follows 1 step commands inconsistently     OT SHORT TERM GOAL #7   Title Pt will consistently maintain eyes open for 20 mins during therapy sessions.    Time 4    Period Weeks    Status New               OT Long Term Goals - 12/22/21 1349       OT LONG TERM GOAL #1   Title Pt will donn  shirt with mod A, and mod v.c    Time 8    Period Weeks    Status On-going   max A     OT LONG TERM GOAL #2   Title Pt will attend to L hemibody space during ADLs and functional activities at least 25% of the time with min v.c    Time 8    Period Weeks    Status On-going      OT LONG TERM GOAL #3   Title Pt will perfom UB bathing with min A    Revised Pt will perfrom UB bathing with mod A    Time 8    Period Weeks    Status Revised   only washing face and brushing teeth     OT LONG TERM GOAL #4   Title Pt will attend to a simple ADL or functional task for at least 15 mins with no more than min v.c  Revised-Pt will keep his eyes open for at least 30 mins with no more than min prompts.    Time 8    Period Weeks    Status Revised   inconsistent     OT LONG TERM GOAL #5   Title Pt will tolerate P/ROM shoulder flexion to 90* with pain no greater than 2/10 in prep for dressing.    Time 8    Period Weeks    Status On-going   80*     OT LONG TERM GOAL #6   Title Pt will perform toilet transfers with mod A.    Time 8    Period Weeks    Status On-going   Pt has not been performing regularly                  Plan - 12/22/21 1341     Clinical Impression Statement Patient with increased confusion today, more lethargic than he has been.  Spoke  with wife who indicates urine has been clear, she feels he may not have slept well last night, and he has had back to back therapy days.  Wife will monitor patient's status.    OT Occupational Profile and History Detailed Assessment- Review of Records and additional review of physical, cognitive, psychosocial history related to current functional performance    Occupational performance deficits (Please refer to evaluation for details): ADL's;IADL's;Rest and Sleep;Play;Leisure;Social Participation    Body Structure / Function / Physical Skills ADL;UE functional  use;Flexibility;Pain;Vision;FMC;Proprioception;ROM;Gait;Coordination;GMC;Sensation;Decreased knowledge of precautions;Decreased knowledge of use of DME;Dexterity;Mobility;Tone;Strength;IADL    Cognitive Skills Attention;Energy/Drive;Memory;Orientation;Perception;Problem Solve;Safety Awareness;Sequencing;Thought;Understand    Rehab Potential Good    Clinical Decision Making Several treatment options, min-mod task modification necessary    Comorbidities Affecting Occupational Performance: May have comorbidities impacting occupational performance    Modification or Assistance to Complete Evaluation  Min-Moderate modification of tasks or assist with assess necessary to complete eval    OT Frequency 2x / week    OT Duration 8 weeks    OT Treatment/Interventions Self-care/ADL training;Ultrasound;Visual/perceptual remediation/compensation;Patient/family education;Balance training;Passive range of motion;Gait Training;Paraffin;Cryotherapy;Fluidtherapy;Splinting;Building services engineer;Contrast Bath;Moist Heat;Therapeutic exercise;Manual Therapy;Therapeutic activities;Cognitive remediation/compensation;Neuromuscular education    Plan simple ADLs, continue NMR - Postural control, weight shifting forward and left    Consulted and Agree with Plan of Care Patient;Family member/caregiver             Patient will benefit from skilled therapeutic intervention in order to improve the following deficits and impairments:   Body Structure / Function / Physical Skills: ADL, UE functional use, Flexibility, Pain, Vision, FMC, Proprioception, ROM, Gait, Coordination, GMC, Sensation, Decreased knowledge of precautions, Decreased knowledge of use of DME, Dexterity, Mobility, Tone, Strength, IADL Cognitive Skills: Attention, Energy/Drive, Memory, Orientation, Perception, Problem Solve, Safety Awareness, Sequencing, Thought, Understand     Visit Diagnosis: 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)  Abnormal posture  Unsteadiness on feet  Muscle weakness (generalized)  Stiffness of left wrist, not elsewhere classified  Other lack of coordination  Stiffness of left shoulder, not elsewhere classified    Problem List Patient Active Problem List   Diagnosis Date Noted   At high risk for inadequate nutritional intake 07/12/2021   Transaminitis 06/06/2021   Intraparenchymal hematoma of brain 06/04/2021   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HCC) 05/30/2021   Right lower lobe pneumonia 05/30/2021   Prediabetes 05/30/2021   Hyperglycemia 05/30/2021   Intracerebral hemorrhage 05/24/2021   Seizure (HCC) 05/23/2021   Malnutrition of moderate degree 05/22/2021   Memory deficits    OSA (obstructive sleep apnea)    Generalized OA    Hypokalemia    Leukocytosis    Annual physical exam 04/03/2017    Collier Salina, OT 12/22/2021, 1:50 PM  [place] [facility] [address] [place], [place], [address] Phone: [phone]   Fax:  [phone]  Name: Donald [name] MRN: [id number] Date of Birth: [birth year]

## 2021-12-23 ENCOUNTER — Encounter: Payer: No Typology Code available for payment source | Admitting: Occupational Therapy

## 2021-12-28 ENCOUNTER — Ambulatory Visit: Payer: No Typology Code available for payment source | Admitting: Occupational Therapy

## 2021-12-28 ENCOUNTER — Encounter: Payer: Self-pay | Admitting: Occupational Therapy

## 2021-12-28 ENCOUNTER — Ambulatory Visit: Payer: No Typology Code available for payment source | Admitting: Rehabilitation

## 2021-12-28 ENCOUNTER — Other Ambulatory Visit: Payer: Self-pay

## 2021-12-28 ENCOUNTER — Encounter: Payer: Self-pay | Admitting: Rehabilitation

## 2021-12-28 DIAGNOSIS — M6281 Muscle weakness (generalized): Secondary | ICD-10-CM

## 2021-12-28 DIAGNOSIS — I69154 Hemiplegia and hemiparesis following nontraumatic intracerebral hemorrhage affecting left non-dominant side: Secondary | ICD-10-CM

## 2021-12-28 DIAGNOSIS — M25612 Stiffness of left shoulder, not elsewhere classified: Secondary | ICD-10-CM

## 2021-12-28 DIAGNOSIS — R293 Abnormal posture: Secondary | ICD-10-CM

## 2021-12-28 DIAGNOSIS — M25632 Stiffness of left wrist, not elsewhere classified: Secondary | ICD-10-CM

## 2021-12-28 DIAGNOSIS — R2681 Unsteadiness on feet: Secondary | ICD-10-CM

## 2021-12-28 DIAGNOSIS — R2689 Other abnormalities of gait and mobility: Secondary | ICD-10-CM

## 2021-12-28 DIAGNOSIS — R278 Other lack of coordination: Secondary | ICD-10-CM

## 2021-12-28 NOTE — Therapy (Signed)
[facility] [address] [place], [place], [address] Phone: [phone]   Fax:  [phone]  Physical Therapy Treatment  Patient Details  Name: Donald [name] MRN: [id number] Date of Birth: 1956-07-12 Referring Provider (PT): Faith Rogue   Encounter Date: 12/28/2021   PT End of Session - 12/28/21 1513     Visit Number 16    Number of Visits 37    Date for PT Re-Evaluation 02/25/22    Authorization Type [facility] [employer]-60 visit limit (if seen on same day counts as only 1 visit)    Progress Note Due on Visit 20    PT Start Time 1407    PT Stop Time 1447    PT Time Calculation (min) 40 min    Equipment Utilized During Treatment Gait belt    Activity Tolerance Patient limited by fatigue;Patient tolerated treatment well    Behavior During Therapy Flat affect;WFL for tasks assessed/performed             Past Medical History:  Diagnosis Date   Arthritis     Past Surgical History:  Procedure Laterality Date   APPENDECTOMY     IR ANGIO INTRA EXTRACRAN SEL COM CAROTID INNOMINATE BILAT MOD SED  05/28/2021   IR ANGIO VERTEBRAL SEL VERTEBRAL BILAT MOD SED  05/28/2021   IR IVC FILTER PLMT / S&I /IMG GUID/MOD SED  05/27/2021   IR US GUIDE VASC ACCESS RIGHT  05/27/2021    There were no vitals filed for this visit.   Subjective Assessment - 12/28/21 1511     Subjective Pt reports some pain in L hip.  Wife reports they have been standing more in stedy, so perhaps pain is from this?    Patient is accompained by: Family member    Pertinent History See problem list for PMH    Patient Stated Goals Wife's goal:  get back him to some form of mobility to walk with cane or walker.    Currently in Pain? Yes    Pain Score --   does not rate   Pain Location Hip    Pain Orientation Left    Pain Descriptors / Indicators Aching    Pain Type Acute pain    Pain Onset In the past 7 days    Pain Frequency  Intermittent    Aggravating Factors  movement    Pain Relieving Factors unsure                         TA:  Pt received supine on mat from OT.  They had addressed stretching therefore PT worked on some rolling for pelvic dissociation and addressing pusher tendencies.  Max A for rolling R and min A for rolling L with cues and increased time to complete task.  He did tolerate lying on R side much better today than previous sessions.  Once on L side, assisting with sitting on EOM with max A.  Attempted to have him self assist LLE with RLE, but he was unable to fully perform this task.  Once at [facility], worked on forward weight shift with pt reaching RUE to target anteriorly then to PT anteriorly and to the R.  Pt was able to demonstrate active forward weight shift intermittently and able to slightly lift chest out of flexed posture, but again only fleeting, no sustained activation.  During seated tasks, he reports he needs to void urine.  He does wear adult  diaper/brief, however it did not fully catch urine.  PT assisted pt back to w/c via stand pivot (pt actually came to stand more independently than any previous session) at max fading to mod A with most assist to pivot to chair. Wife notified of urine accident.                   PT Short Term Goals - 12/03/21 1500       PT SHORT TERM GOAL #1   Title Pt will be able to come forward away from back of w/c to prepare for transfer with CGA and moderate verbal cuing.    Baseline Currently needs max verbal cuing and min assist.    Time 4    Period Weeks    Status New    Target Date 12/31/21      PT SHORT TERM GOAL #2   Title Pt will perform squat pivot w/c to mat going to the left mod assist for decreased caregiver burden.    Baseline 12/02/21 total asssit    Time 4    Period Weeks    Status New    Target Date 12/31/21      PT SHORT TERM GOAL #3   Title Pt will be able to sit edge of mat > 8 min with mild pertubations  for improved sitting balance and trunk control to assist in ADLs.    Baseline Pt was able to sit at EOB with RUE support at S x 3 mins 10/13/21; 11/02/21 Patient able to sit unsupported for >5 min to allow active participation in PT    Time 4    Period Weeks    Status New    Target Date 12/31/21      PT SHORT TERM GOAL #4   Title Pt will increase left hip flexion to >70 degrees to allow for improved trunk flexion to come forward easiser with transfers.    Baseline 12/02/21 left hip flexion 58 degrees.    Time 4    Period Weeks    Status New    Target Date 12/31/21               PT Long Term Goals - 12/03/21 1506       PT LONG TERM GOAL #1   Title Pt's wife will be instructed in stretching, functional strengthening and balance HEP that she can continue to perform with husband to mazimize his function. (LTGs due 02/25/22)    Baseline 12/02/21 PT continues to add to HEP    Time 12    Period Weeks    Status Revised    Target Date 02/25/22      PT LONG TERM GOAL #2   Title Pt will perform squat pivot transfer w/c to/from mat min assist for improved mobility.    Baseline 12/02/21 squat pivot transfer total assist    Time 12    Period Weeks    Status Revised    Target Date 02/25/22      PT LONG TERM GOAL #3   Title Pt will perform at least 3 minutes of standing at sink or Stedy, with mod assist, for improved participation in ADLs.    Baseline 12/02/21 total assist to stand in [place]    Time 12    Period Weeks    Status Revised    Target Date 02/25/22      PT LONG TERM GOAL #4   Title Pt will be able to perform bed mobility  mod assist for improved mobility and decreased caregiver burden.    Baseline 12/02/21 total assist with sit to/from supine and rolling.    Time 12    Period Weeks    Status New    Target Date 02/25/22      PT LONG TERM GOAL #5   Title Pt will increase left knee extension PROM from lacking 45 degrees to lacking 30 degrees or less for improved mobility  to assist with standing.    Baseline 12/02/21 left knee extension lacking 45 degrees.    Time 12    Period Weeks    Status New    Target Date 02/25/22                   Plan - 12/28/21 1513     Clinical Impression Statement PT received pt from OT in which they addressed stretching, therefore PT session focused on functional bed mobility, sitting balance, decreasing pusher tendencies and forward weight shift.  Pt awake and conversational throughout session.  He was incontinent of urine at end of session in which brief did not catch.  Wife notified so that she could assist changing pt at home.    Personal Factors and Comorbidities Comorbidity 2    Examination-Activity Limitations Locomotion Level;Transfers;Sit;Stand;Dressing;Toileting;Hygiene/Grooming;Bed Mobility;Bathing    Examination-Participation Restrictions Occupation;Community Activity;Church    Stability/Clinical Decision Making Evolving/Moderate complexity    Rehab Potential Fair    PT Frequency 2x / week    PT Duration 12 weeks    PT Treatment/Interventions ADLs/Self Care Home Management;Functional mobility training;Therapeutic activities;Therapeutic exercise;Balance training;Patient/family education;Orthotic Fit/Training;Wheelchair mobility training;Manual techniques;Passive range of motion;Neuromuscular re-education;DME Instruction    PT Next Visit Plan STGs,  Encourage pt to perform more active movements on own as pushes against resistance. Continue to work on sitting on [place] bench (legs abd on either side) for ant pelvic tilt, trunk dissociation, lying on R side to reduce pusher tendencies and open up left side/ help with neck position.    Consulted and Agree with Plan of Care Patient;Family member/caregiver             Patient will benefit from skilled therapeutic intervention in order to improve the following deficits and impairments:  Decreased range of motion, Impaired tone, Difficulty walking, Decreased activity  tolerance, Decreased balance, Impaired flexibility, Decreased mobility, Decreased strength, Postural dysfunction  Visit Diagnosis: 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)  Abnormal posture  Unsteadiness on feet  Muscle weakness (generalized)     Problem List Patient Active Problem List   Diagnosis Date Noted   At high risk for inadequate nutritional intake 07/12/2021   Transaminitis 06/06/2021   Intraparenchymal hematoma of brain 06/04/2021   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HCC) 05/30/2021   Right lower lobe pneumonia 05/30/2021   Prediabetes 05/30/2021   Hyperglycemia 05/30/2021   Intracerebral hemorrhage 05/24/2021   Seizure (HCC) 05/23/2021   Malnutrition of moderate degree 05/22/2021   Memory deficits    OSA (obstructive sleep apnea)    Generalized OA    Hypokalemia    Leukocytosis    Annual physical exam 04/03/2017    Harriet ButteEmily Daesean Lazarz, PT, MPT [facility] [address] [place], [place], [address] Phone: [phone]   Fax:  [phone] 12/28/21, 3:20 PM   Name: Donald [name] MRN: [id number] Date of Birth: 02/01/1956

## 2021-12-28 NOTE — Therapy (Signed)
[facility] [address] [place], [place], [address] Phone: [phone]   Fax:  [phone]  Occupational Therapy Treatment  Patient Details  Name: Donald [name] MRN: [id number] Date of Birth: 01/14/1956 Referring Provider (OT): Dr. Riley KillSwartz   Encounter Date: 12/28/2021   OT End of Session - 12/28/21 1358     Visit Number 18    Number of Visits 30    Date for OT Re-Evaluation 02/08/22    Authorization Type U[employer]    Authorization Time Period renewed 12/14/21 for 8 weeks    Authorization - Visit Number 18    Progress Note Due on Visit 19    OT Start Time 1317    OT Stop Time 1400    OT Time Calculation (min) 43 min    Activity Tolerance Other (comment)   limited by confusion   Behavior [facility] for tasks assessed/performed             Past Medical History:  Diagnosis Date   Arthritis     Past Surgical History:  Procedure Laterality Date   APPENDECTOMY     IR ANGIO INTRA EXTRACRAN SEL COM CAROTID INNOMINATE BILAT MOD SED  05/28/2021   IR ANGIO VERTEBRAL SEL VERTEBRAL BILAT MOD SED  05/28/2021   IR IVC FILTER PLMT / S&I /IMG GUID/MOD SED  05/27/2021   IR US GUIDE VASC ACCESS RIGHT  05/27/2021    There were no vitals filed for this visit.   Subjective Assessment - 12/28/21 1357     Subjective  How are we going to get over there?    Patient is accompanied by: Family member    Pertinent History 66 y.o. male hospitlized with  ICH R frontal lobe and small adjacent  SAH, pt transferred to rehab then he exeperienced worsening midline shift of R MCA, R ACA and DVT in RLE    Limitations hx of seizure, no estim without MD clearance, hx of memory deficit OSA    Patient Stated Goals be more independent    Currently in Pain? No/denies    Pain Score 0-No pain                          OT Treatments/Exercises (OP) - 12/28/21 1606       Bed Mobility   Bed Mobility Rolling Right;Rolling Left     Rolling Right Minimal Assistance - Patient > 75%    Rolling Left Minimal Assistance - Patient > 75%      Transfers   Comments transferred to mat from tilt in space to the left side with max A with scoot pivots      ADLs   LB Dressing hip bridges x 5 with stabilization of knees d/t wanting to rotate to the left.    ADL Comments supine for working on elongating body - neck extension, knee extensions, hip extension for increasing range of motion for upright sitting posture and progressing towards ability to stand      Manual Therapy   Manual Therapy Passive ROM    Passive ROM to LUE hand, wrist and elbow and shoulder. Pt with indication of pain during stretches but oerall tolerated well.                      OT Short Term Goals - 12/22/21 1349       OT SHORT TERM GOAL #1   Title Pt's family with be  I with inital HEP.    Baseline Min verbal/tactile cues for don/doff    Period Weeks    Status Achieved    Target Date 10/28/21      OT SHORT TERM GOAL #2   Title Pt's family with be I with splint wear, care and precautions and LUE positioning to minimize pain and contracture.    Period Weeks    Status Achieved      OT SHORT TERM GOAL #3   Title Pt will consistently wash face and chest with no more than min A.    Time 4    Period Weeks    Status On-going   per wife - washing face not chest     OT SHORT TERM GOAL #4   Title Pt will attend to left hemibody space at least 10% of the time with no more than mod v.c    Period Weeks    Status Achieved      OT SHORT TERM GOAL #5   Title Pt will assist with pulling down shirt in front at least 25% of them with min v.c    Time 4    Period Weeks    Status On-going   20%     OT SHORT TERM GOAL #6   Title Pt will follow a simple  1 step command consistently 75% of the time during therapy.    Time 4    Period Weeks    Status On-going   follows 1 step commands inconsistently     OT SHORT TERM GOAL #7   Title Pt will  consistently maintain eyes open for 20 mins during therapy sessions.    Time 4    Period Weeks    Status New               OT Long Term Goals - 12/22/21 1349       OT LONG TERM GOAL #1   Title Pt will donn shirt with mod A, and mod v.c    Time 8    Period Weeks    Status On-going   max A     OT LONG TERM GOAL #2   Title Pt will attend to L hemibody space during ADLs and functional activities at least 25% of the time with min v.c    Time 8    Period Weeks    Status On-going      OT LONG TERM GOAL #3   Title Pt will perfom UB bathing with min A    Revised Pt will perfrom UB bathing with mod A    Time 8    Period Weeks    Status Revised   only washing face and brushing teeth     OT LONG TERM GOAL #4   Title Pt will attend to a simple ADL or functional task for at least 15 mins with no more than min v.c  Revised-Pt will keep his eyes open for at least 30 mins with no more than min prompts.    Time 8    Period Weeks    Status Revised   inconsistent     OT LONG TERM GOAL #5   Title Pt will tolerate P/ROM shoulder flexion to 90* with pain no greater than 2/10 in prep for dressing.    Time 8    Period Weeks    Status On-going   80*     OT LONG TERM GOAL #6   Title Pt will perform  toilet transfers with mod A.    Time 8    Period Weeks    Status On-going   Pt has not been performing regularly                  Plan - 12/28/21 1359     Clinical Impression Statement Pt with brighter affect and mood today with conversing entire session with therapist. Pt limited by spasticity and decreased range of motion.    OT Occupational Profile and History Detailed Assessment- Review of Records and additional review of physical, cognitive, psychosocial history related to current functional performance    Occupational performance deficits (Please refer to evaluation for details): ADL's;IADL's;Rest and Sleep;Play;Leisure;Social Participation    Body Structure / Function /  Physical Skills ADL;UE functional use;Flexibility;Pain;Vision;FMC;Proprioception;ROM;Gait;Coordination;GMC;Sensation;Decreased knowledge of precautions;Decreased knowledge of use of DME;Dexterity;Mobility;Tone;Strength;IADL    Cognitive Skills Attention;Energy/Drive;Memory;Orientation;Perception;Problem Solve;Safety Awareness;Sequencing;Thought;Understand    Rehab Potential Good    Clinical Decision Making Several treatment options, min-mod task modification necessary    Comorbidities Affecting Occupational Performance: May have comorbidities impacting occupational performance    Modification or Assistance to Complete Evaluation  Min-Moderate modification of tasks or assist with assess necessary to complete eval    OT Frequency 2x / week    OT Duration 8 weeks    OT Treatment/Interventions Self-care/ADL training;Ultrasound;Visual/perceptual remediation/compensation;Patient/family education;Balance training;Passive range of motion;Gait Training;Paraffin;Cryotherapy;Fluidtherapy;Splinting;Building services engineer;Contrast Bath;Moist Heat;Therapeutic exercise;Manual Therapy;Therapeutic activities;Cognitive remediation/compensation;Neuromuscular education    Plan simple ADLs, continue NMR - Postural control, weight shifting forward and left    Consulted and Agree with Plan of Care Patient;Family member/caregiver             Patient will benefit from skilled therapeutic intervention in order to improve the following deficits and impairments:   Body Structure / Function / Physical Skills: ADL, UE functional use, Flexibility, Pain, Vision, FMC, Proprioception, ROM, Gait, Coordination, GMC, Sensation, Decreased knowledge of precautions, Decreased knowledge of use of DME, Dexterity, Mobility, Tone, Strength, IADL Cognitive Skills: Attention, Energy/Drive, Memory, Orientation, Perception, Problem Solve, Safety Awareness, Sequencing, Thought, Understand     Visit Diagnosis: 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)  Abnormal posture  Unsteadiness on feet  Muscle weakness (generalized)  Stiffness of left wrist, not elsewhere classified  Other lack of coordination  Stiffness of left shoulder, not elsewhere classified  Other abnormalities of gait and mobility    Problem List Patient Active Problem List   Diagnosis Date Noted   At high risk for inadequate nutritional intake 07/12/2021   Transaminitis 06/06/2021   Intraparenchymal hematoma of brain 06/04/2021   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HCC) 05/30/2021   Right lower lobe pneumonia 05/30/2021   Prediabetes 05/30/2021   Hyperglycemia 05/30/2021   Intracerebral hemorrhage 05/24/2021   Seizure (HCC) 05/23/2021   Malnutrition of moderate degree 05/22/2021   Memory deficits    OSA (obstructive sleep apnea)    Generalized OA    Hypokalemia    Leukocytosis    Annual physical exam 04/03/2017    Junious Dresser, OT 12/28/2021, 4:10 PM  [facility] [address] [place], [place], [address] Phone: [phone]   Fax:  [phone]  Name: Donald [name] MRN: [id number] Date of Birth: 03-15-1956

## 2021-12-30 ENCOUNTER — Ambulatory Visit: Payer: No Typology Code available for payment source | Admitting: Occupational Therapy

## 2021-12-30 ENCOUNTER — Encounter: Payer: Self-pay | Admitting: Physical Therapy

## 2021-12-30 ENCOUNTER — Encounter: Payer: Self-pay | Admitting: Occupational Therapy

## 2021-12-30 ENCOUNTER — Other Ambulatory Visit: Payer: Self-pay

## 2021-12-30 ENCOUNTER — Ambulatory Visit: Payer: No Typology Code available for payment source | Admitting: Physical Therapy

## 2021-12-30 DIAGNOSIS — I69154 Hemiplegia and hemiparesis following nontraumatic intracerebral hemorrhage affecting left non-dominant side: Secondary | ICD-10-CM

## 2021-12-30 DIAGNOSIS — R293 Abnormal posture: Secondary | ICD-10-CM

## 2021-12-30 DIAGNOSIS — M6281 Muscle weakness (generalized): Secondary | ICD-10-CM

## 2021-12-30 DIAGNOSIS — R2681 Unsteadiness on feet: Secondary | ICD-10-CM

## 2021-12-30 DIAGNOSIS — M25612 Stiffness of left shoulder, not elsewhere classified: Secondary | ICD-10-CM

## 2021-12-30 DIAGNOSIS — R278 Other lack of coordination: Secondary | ICD-10-CM

## 2021-12-30 DIAGNOSIS — M25632 Stiffness of left wrist, not elsewhere classified: Secondary | ICD-10-CM

## 2021-12-30 NOTE — Therapy (Signed)
[facility] [address] [place], [place], [address] Phone: [phone]   Fax:  [phone]  Occupational Therapy Treatment and Progress Update  Patient Details  Name: Donald [name] MRN: [id number] Date of Birth: 1956/08/24 Referring Provider (OT): Dr. Naaman Plummer   Encounter Date: 12/30/2021   OT End of Session - 12/30/21 1740     Visit Number 19    Number of Visits 30    Date for OT Re-Evaluation 02/08/22    Authorization Type UHC    Authorization Time Period renewed 12/14/21 for 8 weeks    Authorization - Visit Number 19    Progress Note Due on Visit 67    OT Start Time 1445    OT Stop Time 1530    OT Time Calculation (min) 45 min    Activity Tolerance Patient tolerated treatment well    Behavior During Therapy [facility] for tasks assessed/performed             Past Medical History:  Diagnosis Date   Arthritis     Past Surgical History:  Procedure Laterality Date   APPENDECTOMY     IR ANGIO INTRA EXTRACRAN SEL COM CAROTID INNOMINATE BILAT MOD SED  05/28/2021   IR ANGIO VERTEBRAL SEL VERTEBRAL BILAT MOD SED  05/28/2021   IR IVC FILTER PLMT / S&I /IMG GUID/MOD SED  05/27/2021   IR US GUIDE VASC ACCESS RIGHT  05/27/2021    There were no vitals filed for this visit.   Subjective Assessment - 12/30/21 1735     Subjective  We are going to need ice cream    Patient is accompanied by: Family member    Pertinent History 66 y.o. male hospitlized with  [place] R frontal lobe and small adjacent  SAH, pt transferred to rehab then he exeperienced worsening midline shift of R MCA, R ACA and DVT in RLE    Limitations hx of seizure, no estim without MD clearance, hx of memory deficit OSA    Patient Stated Goals be more independent    Currently in Pain? No/denies    Pain Score 0-No pain                          OT Treatments/Exercises (OP) - 12/30/21 0001       Neurological Re-education Exercises   Other  Exercises 1 Neuromuscular reeducation to address trunk mobility.  Patient with severely limited trunk movement.  Maintains flexed posture with lateral curve - very forward, stiff head.  Sits with full posterior rotation of pelvis - used sheet behind patient to assist with upright trunk, weight shift in sitting and forward transition.  Needs increased time with all transitions. Transitioned to sidelying then supine with total assist.  Worked on counterotation and rotation in trunk supine to sidelying with feet up on physioball.   Transferred from mat to chair on right with mod assist.  Cleaned and dried  palm of hand to prevent skin breakdown.  Wife aware and attentive to his skin.                      OT Short Term Goals - 12/30/21 1745       OT SHORT TERM GOAL #1   Title Pt's family with be I with inital HEP.    Baseline Min verbal/tactile cues for don/doff    Period Weeks    Status Achieved    Target Date 10/28/21  OT SHORT TERM GOAL #2   Title Pt's family with be I with splint wear, care and precautions and LUE positioning to minimize pain and contracture.    Period Weeks    Status Achieved      OT SHORT TERM GOAL #3   Title Pt will consistently wash face and chest with no more than min A.    Time 4    Period Weeks    Status Partially Met   per wife - washing face not chest     OT SHORT TERM GOAL #4   Title Pt will attend to left hemibody space at least 10% of the time with no more than mod v.c    Period Weeks    Status Achieved      OT SHORT TERM GOAL #5   Title Pt will assist with pulling down shirt in front at least 25% of them with min v.c    Time 4    Period Weeks    Status Achieved   20%     OT SHORT TERM GOAL #6   Title Pt will follow a simple  1 step command consistently 75% of the time during therapy.    Time 4    Period Weeks    Status Achieved   follows 1 step commands inconsistently     OT SHORT TERM GOAL #7   Title Pt will consistently  maintain eyes open for 20 mins during therapy sessions.    Time 4    Period Weeks    Status Achieved               OT Long Term Goals - 12/30/21 1746       OT LONG TERM GOAL #1   Title Pt will donn shirt with mod A, and mod v.c    Time 8    Period Weeks    Status On-going   max A     OT LONG TERM GOAL #2   Title Pt will attend to L hemibody space during ADLs and functional activities at least 25% of the time with min v.c    Time 8    Period Weeks    Status On-going      OT LONG TERM GOAL #3   Title Pt will perfom UB bathing with min A    Revised Pt will perform UB bathing with mod A    Time 8    Period Weeks    Status On-going   only washing face and brushing teeth     OT LONG TERM GOAL #4   Title Pt will attend to a simple ADL or functional task for at least 15 mins with no more than min v.c  Revised-Pt will keep his eyes open for at least 30 mins with no more than min prompts.    Time 8    Period Weeks    Status Achieved   inconsistent     OT LONG TERM GOAL #5   Title Pt will tolerate P/ROM shoulder flexion to 90* with pain no greater than 2/10 in prep for dressing.    Time 8    Period Weeks    Status On-going   80*     OT LONG TERM GOAL #6   Title Pt will perform toilet transfers with mod A.    Time 8    Period Weeks    Status On-going   Pt has not been performing regularly  Plan - 12/30/21 1741     Clinical Impression Statement This progress report covers dates of service between 11/02/21  and 12/30/21. Patient had a brief break in therapy to travel with his family.  Patient continues with severe postural control deficits / stiffness, confusion.  Patient has shown some improvement with functional mobility - transfers, sitting balance, weight shifting, partial stand with stedy lift, however patient limited by musculoskeletal system changes and limitations to range of motion - hips, knees, trunk and left arm.  Patient is participating  as able on some aspects of ADL. Wife attends every session, and is receptive.    OT Occupational Profile and History Detailed Assessment- Review of Records and additional review of physical, cognitive, psychosocial history related to current functional performance    Occupational performance deficits (Please refer to evaluation for details): ADL's;IADL's;Rest and Sleep;Play;Leisure;Social Participation    Body Structure / Function / Physical Skills ADL;UE functional use;Flexibility;Pain;Vision;FMC;Proprioception;ROM;Gait;Coordination;GMC;Sensation;Decreased knowledge of precautions;Decreased knowledge of use of DME;Dexterity;Mobility;Tone;Strength;IADL    Cognitive Skills Attention;Energy/Drive;Memory;Orientation;Perception;Problem Solve;Safety Awareness;Sequencing;Thought;Understand    Rehab Potential Good    Clinical Decision Making Several treatment options, min-mod task modification necessary    Comorbidities Affecting Occupational Performance: May have comorbidities impacting occupational performance    Modification or Assistance to Complete Evaluation  Min-Moderate modification of tasks or assist with assess necessary to complete eval    OT Frequency 2x / week    OT Duration 8 weeks    OT Treatment/Interventions Self-care/ADL training;Ultrasound;Visual/perceptual remediation/compensation;Patient/family education;Balance training;Passive range of motion;Gait Training;Paraffin;Cryotherapy;Fluidtherapy;Splinting;Therapist, nutritional;Contrast Bath;Moist Heat;Therapeutic exercise;Manual Therapy;Therapeutic activities;Cognitive remediation/compensation;Neuromuscular education    Plan simple ADLs, continue NMR - Postural control, weight shifting forward and left    Consulted and Agree with Plan of Care Patient;Family member/caregiver    Family Member Consulted wife             Patient will benefit from skilled therapeutic intervention in order to improve the following deficits and  impairments:   Body Structure / Function / Physical Skills: ADL, UE functional use, Flexibility, Pain, Vision, FMC, Proprioception, ROM, Gait, Coordination, GMC, Sensation, Decreased knowledge of precautions, Decreased knowledge of use of DME, Dexterity, Mobility, Tone, Strength, IADL Cognitive Skills: Attention, Energy/Drive, Memory, Orientation, Perception, Problem Solve, Safety Awareness, Sequencing, Thought, Understand     Visit Diagnosis: 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)  Abnormal posture  Unsteadiness on feet  Muscle weakness (generalized)  Stiffness of left wrist, not elsewhere classified  Other lack of coordination  Stiffness of left shoulder, not elsewhere classified    Problem List Patient Active Problem List   Diagnosis Date Noted   At high risk for inadequate nutritional intake 07/12/2021   Transaminitis 06/06/2021   Intraparenchymal hematoma of brain 06/04/2021   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[place]) 05/30/2021   Right lower lobe pneumonia 05/30/2021   Prediabetes 05/30/2021   Hyperglycemia 05/30/2021   Intracerebral hemorrhage 05/24/2021   Seizure ([place]) 05/23/2021   Malnutrition of moderate degree 05/22/2021   Memory deficits    OSA (obstructive sleep apnea)    Generalized OA    Hypokalemia    Leukocytosis    Annual physical exam 04/03/2017    Mariah Milling, OT 12/30/2021, 5:47 PM  [facility] [address] [place], [place], [address] Phone: [phone]   Fax:  [phone]  Name: Gleb Mcguire MRN: [id number] Date of Birth: 11/03/1956

## 2021-12-31 NOTE — Therapy (Signed)
[facility] [address] [place], [place], [address] Phone: [phone]   Fax:  [phone]  Physical Therapy Treatment  Patient Details  Name: Donald [name] MRN: [id number] Date of Birth: 1956/03/11 Referring Provider (PT): Alger Simons   Encounter Date: 12/30/2021   PT End of Session - 12/30/21 1630     Visit Number 17    Number of Visits 37    Date for PT Re-Evaluation 02/25/22    Authorization Type [employer]-60 visit limit (if seen on same day counts as only 1 visit)    Progress Note Due on Visit 20    PT Start Time 1410   pt late due to transportation   PT Stop Time 1445    PT Time Calculation (min) 35 min    Equipment Utilized During Treatment Gait belt    Activity Tolerance Patient limited by fatigue;Patient tolerated treatment well    Behavior During Therapy Flat affect;WFL for tasks assessed/performed             Past Medical History:  Diagnosis Date   Arthritis     Past Surgical History:  Procedure Laterality Date   APPENDECTOMY     IR ANGIO INTRA EXTRACRAN SEL COM CAROTID INNOMINATE BILAT MOD SED  05/28/2021   IR ANGIO VERTEBRAL SEL VERTEBRAL BILAT MOD SED  05/28/2021   IR IVC FILTER PLMT / S&I /IMG GUID/MOD SED  05/27/2021   IR US GUIDE VASC ACCESS RIGHT  05/27/2021    There were no vitals filed for this visit.   Subjective Assessment - 12/30/21 1414     Subjective No new complaints. No falls. Pt reporting some soreness in left leg. Spouse reports they have been standing more in the morning at home.    Patient is accompained by: Family member    Pertinent History See problem list for PMH    Patient Stated Goals Wife's goal:  get back him to some form of mobility to walk with cane or walker.    Currently in Pain? Yes    Pain Score --   pt does not rate/unable to   Pain Location Hip    Pain Orientation Left    Pain Descriptors / Indicators Aching    Pain Type  Acute pain    Pain Onset In the past 7 days    Pain Frequency Intermittent    Aggravating Factors  movement    Pain Relieving Factors unsure                      [facility] Adult PT Treatment/Exercise - 12/30/21 1415       Transfers   Transfers Squat Pivot Transfers    Squat Pivot Transfers 2: Max assist    Squat Pivot Transfer Details (indicate cue type and reason) from wheelchair to mat table going towards left direction. pt able to initiate bringing trunk forward with minimal clearnce of back off wheelchair back. pt then able to hold onto PTA with right UE, otherwise max/total assist for transfer with no weight bearing/initiation of LE's in stance, bil knees blocked.      Neuro Re-ed    Neuro Re-ed Details  for muscle re-ed/stretching:once at edge of mat pt with significant posterior lean with total assist to prevent backward fall. pt transitioned from short sitting at edge of mat to supine on mat table going toward left side with max/total assist. in supine with single pillow under head: working on LE  stretching into extension passively with left>right extension achieved with pt able to assist on right side, resistance on left side. prolonged overpressure at knee and foot into DF to allow tone to relax. during this time cues provided to pt for neck extension/to allow head to relax on pillow due to tone/pusher tendency keeping head lifted up. max/total assist to roll onto right side with single pillow as well. Pt with tendecy to want to use right hand to hold head up. engage right hand in squeezing foam ball/playing toss to allow gravity to assist with cervical muscle relaxation to stretch left sided muscles as head relaxed down on pillow. also continued to work on left LE extension in side lying. ended with working on pelvic mobility in side lying with pt assisting at times. max assist to return to supine with improved cervical extension noted, however pt still holding head up. with bil  knees bent worked on passive lower trunk rotation with holds for lower back/hip stretching. pt left in supine on mat for OT session following PT session.                       PT Short Term Goals - 12/30/21 1630       PT SHORT TERM GOAL #1   Title Pt will be able to come forward away from back of w/c to prepare for transfer with CGA and moderate verbal cuing.    Baseline 12/30/21: pt able to initiate with max verbal cueing, minimal clearance of back off backrest noted. improved from max cues with min assist    Status Partially Met    Target Date 12/31/21      PT SHORT TERM GOAL #2   Title Pt will perform squat pivot w/c to mat going to the left mod assist for decreased caregiver burden.    Baseline 12/30/21: remains max/total assist with going to left    Status Not Met    Target Date 12/31/21      PT SHORT TERM GOAL #3   Title Pt will be able to sit edge of mat > 8 min with mild pertubations for improved sitting balance and trunk control to assist in ADLs.    Baseline Pt was able to sit at EOB with RUE support at S x 3 mins 10/13/21; 11/02/21 Patient able to sit unsupported for >5 min to allow active participation in PT    Time 4    Period Weeks    Status On-going    Target Date 12/31/21      PT SHORT TERM GOAL #4   Title Pt will increase left hip flexion to >70 degrees to allow for improved trunk flexion to come forward easiser with transfers.    Baseline 12/02/21 left hip flexion 58 degrees.    Time 4    Period Weeks    Status On-going    Target Date 12/31/21               PT Long Term Goals - 12/03/21 1506       PT LONG TERM GOAL #1   Title Pt's wife will be instructed in stretching, functional strengthening and balance HEP that she can continue to perform with husband to mazimize his function. (LTGs due 02/25/22)    Baseline 12/02/21 PT continues to add to HEP    Time 12    Period Weeks    Status Revised    Target Date 02/25/22      PT  LONG TERM GOAL  #2   Title Pt will perform squat pivot transfer w/c to/from mat min assist for improved mobility.    Baseline 12/02/21 squat pivot transfer total assist    Time 12    Period Weeks    Status Revised    Target Date 02/25/22      PT LONG TERM GOAL #3   Title Pt will perform at least 3 minutes of standing at sink or [place], with mod assist, for improved participation in ADLs.    Baseline 12/02/21 total assist to stand in [place]    Time 12    Period Weeks    Status Revised    Target Date 02/25/22      PT LONG TERM GOAL #4   Title Pt will be able to perform bed mobility mod assist for improved mobility and decreased caregiver burden.    Baseline 12/02/21 total assist with sit to/from supine and rolling.    Time 12    Period Weeks    Status New    Target Date 02/25/22      PT LONG TERM GOAL #5   Title Pt will increase left knee extension PROM from lacking 45 degrees to lacking 30 degrees or less for improved mobility to assist with standing.    Baseline 12/02/21 left knee extension lacking 45 degrees.    Time 12    Period Weeks    Status New    Target Date 02/25/22                   Plan - 12/30/21 1630     Clinical Impression Statement Today's skilled session began to check progress toward STGs with one goal partially met, other goal not met. Will need to check remaining goals at next session. Continued to address stretching to decrease tone and transfers/bed mobility with session. Pt seen by PT 1st today with increased tone at start of session that had imporved for OT session afterwards with pt able to sit unsupported for OT, whereas needed max/total assist at start of PT session. Pt is slowly progressing and should benefit from continued PT to progress toward unmet goals.    Personal Factors and Comorbidities Comorbidity 2    Examination-Activity Limitations Locomotion Level;Transfers;Sit;Stand;Dressing;Toileting;Hygiene/Grooming;Bed Mobility;Bathing     Examination-Participation Restrictions Occupation;Community Activity;Church    Stability/Clinical Decision Making Evolving/Moderate complexity    Rehab Potential Fair    PT Frequency 2x / week    PT Duration 12 weeks    PT Treatment/Interventions ADLs/Self Care Home Management;Functional mobility training;Therapeutic activities;Therapeutic exercise;Balance training;Patient/family education;Orthotic Fit/Training;Wheelchair mobility training;Manual techniques;Passive range of motion;Neuromuscular re-education;DME Instruction    PT Next Visit Plan check remaining STGs  Encourage pt to perform more active movements on own as pushes against resistance. Continue to work on sitting on [place] bench (legs abd on either side) for ant pelvic tilt, trunk dissociation, lying on R side to reduce pusher tendencies and open up left side/ help with neck position.    Consulted and Agree with Plan of Care Patient;Family member/caregiver             Patient will benefit from skilled therapeutic intervention in order to improve the following deficits and impairments:  Decreased range of motion, Impaired tone, Difficulty walking, Decreased activity tolerance, Decreased balance, Impaired flexibility, Decreased mobility, Decreased strength, Postural dysfunction  Visit Diagnosis: 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)  Abnormal posture  Unsteadiness on feet  Muscle weakness (generalized)  Problem List Patient Active Problem List   Diagnosis Date Noted   At high risk for inadequate nutritional intake 07/12/2021   Transaminitis 06/06/2021   Intraparenchymal hematoma of brain 06/04/2021   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[place]) 05/30/2021   Right lower lobe pneumonia 05/30/2021   Prediabetes 05/30/2021   Hyperglycemia 05/30/2021   Intracerebral hemorrhage 05/24/2021   Seizure ([place]) 05/23/2021   Malnutrition of moderate degree 05/22/2021    Memory deficits    OSA (obstructive sleep apnea)    Generalized OA    Hypokalemia    Leukocytosis    Annual physical exam 04/03/2017    Willow Ora, PTA, [facility] [address], [place], [place] [address] [phone] 12/31/21, 8:39 AM   Name: Donald [name] MRN: [id number] Date of Birth: 02-May-1956

## 2022-01-04 ENCOUNTER — Ambulatory Visit: Payer: No Typology Code available for payment source | Admitting: Rehabilitation

## 2022-01-04 ENCOUNTER — Ambulatory Visit: Payer: No Typology Code available for payment source | Admitting: Occupational Therapy

## 2022-01-06 ENCOUNTER — Ambulatory Visit: Payer: No Typology Code available for payment source | Admitting: Physical Therapy

## 2022-01-06 ENCOUNTER — Ambulatory Visit: Payer: No Typology Code available for payment source | Admitting: Occupational Therapy

## 2022-01-06 ENCOUNTER — Other Ambulatory Visit: Payer: Self-pay

## 2022-01-06 DIAGNOSIS — R293 Abnormal posture: Secondary | ICD-10-CM

## 2022-01-06 DIAGNOSIS — M6281 Muscle weakness (generalized): Secondary | ICD-10-CM

## 2022-01-06 DIAGNOSIS — I69154 Hemiplegia and hemiparesis following nontraumatic intracerebral hemorrhage affecting left non-dominant side: Secondary | ICD-10-CM

## 2022-01-06 DIAGNOSIS — R2681 Unsteadiness on feet: Secondary | ICD-10-CM

## 2022-01-06 DIAGNOSIS — M25632 Stiffness of left wrist, not elsewhere classified: Secondary | ICD-10-CM

## 2022-01-06 DIAGNOSIS — R278 Other lack of coordination: Secondary | ICD-10-CM

## 2022-01-06 DIAGNOSIS — M25612 Stiffness of left shoulder, not elsewhere classified: Secondary | ICD-10-CM

## 2022-01-06 NOTE — Therapy (Signed)
[facility] [address] [place], [place], [address] Phone: [phone]   Fax:  [phone]  Occupational Therapy Treatment  Patient Details  Name: Donald [name] MRN: [id number] Date of Birth: 06-03-1956 Referring Provider (OT): Dr. Naaman Plummer   Encounter Date: 01/06/2022   OT End of Session - 01/06/22 1624     Visit Number 20    Number of Visits 30    Date for OT Re-Evaluation 02/08/22    Authorization Type [employer]    Authorization Time Period renewed 12/14/21 for 8 weeks    Authorization - Visit Number 20    Progress Note Due on Visit 28    OT Start Time 1537    OT Stop Time 1615    OT Time Calculation (min) 38 min    Activity Tolerance Patient tolerated treatment well    Behavior During Therapy [facility] for tasks assessed/performed             Past Medical History:  Diagnosis Date   Arthritis     Past Surgical History:  Procedure Laterality Date   APPENDECTOMY     IR ANGIO INTRA EXTRACRAN SEL COM CAROTID INNOMINATE BILAT MOD SED  05/28/2021   IR ANGIO VERTEBRAL SEL VERTEBRAL BILAT MOD SED  05/28/2021   IR IVC FILTER PLMT / S&I /IMG GUID/MOD SED  05/27/2021   IR US GUIDE VASC ACCESS RIGHT  05/27/2021    There were no vitals filed for this visit.   Subjective Assessment - 01/06/22 1626     Subjective  Pt and wife report they want to wrap up next week    Pertinent History 66 y.o. male hospitlized with  ICH R frontal lobe and small adjacent  SAH, pt transferred to rehab then he exeperienced worsening midline shift of R MCA, R ACA and DVT in RLE    Limitations hx of seizure, no estim without MD clearance, hx of memory deficit OSA    Patient Stated Goals be more independent    Currently in Pain? No/denies                   Treatment: Therapist transferred pt to the mat via steadi lift, pt was able to pull him self up to stand using the steadi with min A. Pt sat edge of mat with supervision, to perform  functional reaching activities,  and grasp release for target items with LUE with mod with mod prompts. Pt demonstrates improved overall alertness and increased participation in therapy, however pt remains limited by spasticity and postural issues. Therapist started checking pt goals and discussed plans for d/c with pt and wife.                 OT Short Term Goals - 12/30/21 1745       OT SHORT TERM GOAL #1   Title Pt's family with be I with inital HEP.    Baseline Min verbal/tactile cues for don/doff    Period Weeks    Status Achieved    Target Date 10/28/21      OT SHORT TERM GOAL #2   Title Pt's family with be I with splint wear, care and precautions and LUE positioning to minimize pain and contracture.    Period Weeks    Status Achieved      OT SHORT TERM GOAL #3   Title Pt will consistently wash face and chest with no more than min A.    Time 4    Period  Weeks    Status Partially Met   per wife - washing face not chest     OT SHORT TERM GOAL #4   Title Pt will attend to left hemibody space at least 10% of the time with no more than mod v.c    Period Weeks    Status Achieved      OT SHORT TERM GOAL #5   Title Pt will assist with pulling down shirt in front at least 25% of them with min v.c    Time 4    Period Weeks    Status Achieved   20%     OT SHORT TERM GOAL #6   Title Pt will follow a simple  1 step command consistently 75% of the time during therapy.    Time 4    Period Weeks    Status Achieved   follows 1 step commands inconsistently     OT SHORT TERM GOAL #7   Title Pt will consistently maintain eyes open for 20 mins during therapy sessions.    Time 4    Period Weeks    Status Achieved               OT Long Term Goals - 01/06/22 1554       OT LONG TERM GOAL #1   Title Pt will donn shirt with mod A, and mod v.c    Status Not Met   max A pt is doing 60%     OT LONG TERM GOAL #2   Title Pt will attend to L hemibody space during  ADLs and functional activities at least 25% of the time with min v.c    Status On-going      OT LONG TERM GOAL #3   Title Pt will perfom UB bathing with min A    Revised Pt will perform UB bathing with mod A    Status Not Met   40%- max A     OT LONG TERM GOAL #4   Title Pt will attend to a simple ADL or functional task for at least 15 mins with no more than min v.c  Revised-Pt will keep his eyes open for at least 30 mins with no more than min prompts.    Status Achieved      OT LONG TERM GOAL #5   Title Pt will tolerate P/ROM shoulder flexion to 90* with pain no greater than 2/10 in prep for dressing.    Status On-going      OT LONG TERM GOAL #6   Title Pt will perform toilet transfers with mod A.    Status Not Met   pt is using steady at home                  Plan - 01/06/22 1626     Clinical Impression Statement Pt demonstrates overall more atlertness and pt' wife reports he is assisting more with ADLS. Pt and wife request to wrap up with therapy next week.    OT Occupational Profile and History Detailed Assessment- Review of Records and additional review of physical, cognitive, psychosocial history related to current functional performance    Occupational performance deficits (Please refer to evaluation for details): ADL's;IADL's;Rest and Sleep;Play;Leisure;Social Participation    Body Structure / Function / Physical Skills ADL;UE functional use;Flexibility;Pain;Vision;FMC;Proprioception;ROM;Gait;Coordination;GMC;Sensation;Decreased knowledge of precautions;Decreased knowledge of use of DME;Dexterity;Mobility;Tone;Strength;IADL    Cognitive Skills Attention;Energy/Drive;Memory;Orientation;Perception;Problem Solve;Safety Awareness;Sequencing;Thought;Understand    Rehab Potential Good    Clinical Decision Making  Several treatment options, min-mod task modification necessary    Comorbidities Affecting Occupational Performance: May have comorbidities impacting occupational  performance    Modification or Assistance to Complete Evaluation  Min-Moderate modification of tasks or assist with assess necessary to complete eval    OT Frequency 2x / week    OT Duration 8 weeks    OT Treatment/Interventions Self-care/ADL training;Ultrasound;Visual/perceptual remediation/compensation;Patient/family education;Balance training;Passive range of motion;Gait Training;Paraffin;Cryotherapy;Fluidtherapy;Splinting;Therapist, nutritional;Contrast Bath;Moist Heat;Therapeutic exercise;Manual Therapy;Therapeutic activities;Cognitive remediation/compensation;Neuromuscular education    Plan check remaining goals, anticipate d/c next visit    Consulted and Agree with Plan of Care Patient;Family member/caregiver    Family Member Consulted wife             Patient will benefit from skilled therapeutic intervention in order to improve the following deficits and impairments:   Body Structure / Function / Physical Skills: ADL, UE functional use, Flexibility, Pain, Vision, FMC, Proprioception, ROM, Gait, Coordination, GMC, Sensation, Decreased knowledge of precautions, Decreased knowledge of use of DME, Dexterity, Mobility, Tone, Strength, IADL Cognitive Skills: Attention, Energy/Drive, Memory, Orientation, Perception, Problem Solve, Safety Awareness, Sequencing, Thought, Understand     Visit Diagnosis: 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)  Abnormal posture  Unsteadiness on feet  Muscle weakness (generalized)  Stiffness of left wrist, not elsewhere classified  Other lack of coordination  Stiffness of left shoulder, not elsewhere classified    Problem List Patient Active Problem List   Diagnosis Date Noted   At high risk for inadequate nutritional intake 07/12/2021   Transaminitis 06/06/2021   Intraparenchymal hematoma of brain 06/04/2021   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[place]) 05/30/2021   Right  lower lobe pneumonia 05/30/2021   Prediabetes 05/30/2021   Hyperglycemia 05/30/2021   Intracerebral hemorrhage 05/24/2021   Seizure ([place]) 05/23/2021   Malnutrition of moderate degree 05/22/2021   Memory deficits    OSA (obstructive sleep apnea)    Generalized OA    Hypokalemia    Leukocytosis    Annual physical exam 04/03/2017    Jonnell Hentges, OT 01/06/2022, 4:29 PM  [facility] [address] [place], [place], [address] Phone: [phone]   Fax:  [phone]  Name: Donald [name] MRN: [id number] Date of Birth: 1956-03-06

## 2022-01-11 ENCOUNTER — Ambulatory Visit: Payer: No Typology Code available for payment source

## 2022-01-11 ENCOUNTER — Ambulatory Visit: Payer: No Typology Code available for payment source | Admitting: Occupational Therapy

## 2022-01-11 ENCOUNTER — Encounter: Payer: Self-pay | Admitting: Occupational Therapy

## 2022-01-11 ENCOUNTER — Other Ambulatory Visit: Payer: Self-pay

## 2022-01-11 DIAGNOSIS — R2689 Other abnormalities of gait and mobility: Secondary | ICD-10-CM

## 2022-01-11 DIAGNOSIS — M25632 Stiffness of left wrist, not elsewhere classified: Secondary | ICD-10-CM

## 2022-01-11 DIAGNOSIS — R278 Other lack of coordination: Secondary | ICD-10-CM

## 2022-01-11 DIAGNOSIS — M25612 Stiffness of left shoulder, not elsewhere classified: Secondary | ICD-10-CM

## 2022-01-11 DIAGNOSIS — R2681 Unsteadiness on feet: Secondary | ICD-10-CM

## 2022-01-11 DIAGNOSIS — I69154 Hemiplegia and hemiparesis following nontraumatic intracerebral hemorrhage affecting left non-dominant side: Secondary | ICD-10-CM

## 2022-01-11 DIAGNOSIS — M6281 Muscle weakness (generalized): Secondary | ICD-10-CM | POA: Diagnosis not present

## 2022-01-11 DIAGNOSIS — R293 Abnormal posture: Secondary | ICD-10-CM

## 2022-01-11 NOTE — Therapy (Signed)
[facility] [address] [place], [place], [address] Phone: [phone]   Fax:  [phone]  Physical Therapy Treatment  Patient Details  Name: Donald [name] MRN: [id number] Date of Birth: 10/20/1956 Referring Provider (PT): Alger Simons   Encounter Date: 01/11/2022   PT End of Session - 01/11/22 1349     Visit Number 18    Number of Visits 37    Date for PT Re-Evaluation 02/25/22    Authorization Type [employer]-60 visit limit (if seen on same day counts as only 1 visit)    Progress Note Due on Visit 20    PT Start Time 1235    PT Stop Time 1320    PT Time Calculation (min) 45 min    Equipment Utilized During Treatment Gait belt    Activity Tolerance Patient limited by fatigue;Patient tolerated treatment well    Behavior During Therapy Flat affect;WFL for tasks assessed/performed             Past Medical History:  Diagnosis Date   Arthritis     Past Surgical History:  Procedure Laterality Date   APPENDECTOMY     IR ANGIO INTRA EXTRACRAN SEL COM CAROTID INNOMINATE BILAT MOD SED  05/28/2021   IR ANGIO VERTEBRAL SEL VERTEBRAL BILAT MOD SED  05/28/2021   IR IVC FILTER PLMT / S&I /IMG GUID/MOD SED  05/27/2021   IR US GUIDE VASC ACCESS RIGHT  05/27/2021    There were no vitals filed for this visit.   Subjective Assessment - 01/11/22 1303     Subjective wife uses stander at home while getting him ready and he sits on the black seat of the stander. During the night l leg curls up and takes a while to stretch it out in the morning.    Patient is accompained by: Family member    Pertinent History See problem list for PMH    Patient Stated Goals Wife's goal:  get back him to some form of mobility to walk with cane or walker.    Currently in Pain? No/denies    Pain Onset In the past 7 days                 Clarise Cruz lift: sit to stand: 5x with up to 5' standing each time,  gradually increased height as tolerated during stanging to improve stretching of posterior musculature in bil LE. PT needed to stabilize L foot on ground against tone.  Mat table to wheelchair transfer, used lateral scoot transfers with moderate to max A for hand placement and weight shifting.  Wife educated on maintaining heels flat in the standing lift as much as tolerated                        PT Short Term Goals - 12/30/21 1630       PT SHORT TERM GOAL #1   Title Pt will be able to come forward away from back of w/c to prepare for transfer with CGA and moderate verbal cuing.    Baseline 12/30/21: pt able to initiate with max verbal cueing, minimal clearance of back off backrest noted. improved from max cues with min assist    Status Partially Met    Target Date 12/31/21      PT SHORT TERM GOAL #2   Title Pt will perform squat pivot w/c to mat going to the left mod assist for decreased caregiver burden.  Baseline 12/30/21: remains max/total assist with going to left    Status Not Met    Target Date 12/31/21      PT SHORT TERM GOAL #3   Title Pt will be able to sit edge of mat > 8 min with mild pertubations for improved sitting balance and trunk control to assist in ADLs.    Baseline Pt was able to sit at EOB with RUE support at S x 3 mins 10/13/21; 11/02/21 Patient able to sit unsupported for >5 min to allow active participation in PT    Time 4    Period Weeks    Status On-going    Target Date 12/31/21      PT SHORT TERM GOAL #4   Title Pt will increase left hip flexion to >70 degrees to allow for improved trunk flexion to come forward easiser with transfers.    Baseline 12/02/21 left hip flexion 58 degrees.    Time 4    Period Weeks    Status On-going    Target Date 12/31/21               PT Long Term Goals - 12/03/21 1506       PT LONG TERM GOAL #1   Title Pt's wife will be instructed in stretching, functional strengthening and balance HEP  that she can continue to perform with husband to mazimize his function. (LTGs due 02/25/22)    Baseline 12/02/21 PT continues to add to HEP    Time 12    Period Weeks    Status Revised    Target Date 02/25/22      PT LONG TERM GOAL #2   Title Pt will perform squat pivot transfer w/c to/from mat min assist for improved mobility.    Baseline 12/02/21 squat pivot transfer total assist    Time 12    Period Weeks    Status Revised    Target Date 02/25/22      PT LONG TERM GOAL #3   Title Pt will perform at least 3 minutes of standing at sink or [place], with mod assist, for improved participation in ADLs.    Baseline 12/02/21 total assist to stand in [employer]    Time 12    Period Weeks    Status Revised    Target Date 02/25/22      PT LONG TERM GOAL #4   Title Pt will be able to perform bed mobility mod assist for improved mobility and decreased caregiver burden.    Baseline 12/02/21 total assist with sit to/from supine and rolling.    Time 12    Period Weeks    Status New    Target Date 02/25/22      PT LONG TERM GOAL #5   Title Pt will increase left knee extension PROM from lacking 45 degrees to lacking 30 degrees or less for improved mobility to assist with standing.    Baseline 12/02/21 left knee extension lacking 45 degrees.    Time 12    Period Weeks    Status New    Target Date 02/25/22                   Plan - 01/11/22 1304     Clinical Impression Statement Today's session was focused on performing standing with sara lift to gradually increase the WB on LE, PT had to stabilize L knee from flexing due to significant tone in hamstrings and calf muscles to maintain WB through  heels. Pt was gradually increased height of standing as tolerated.    Personal Factors and Comorbidities Comorbidity 2    Examination-Activity Limitations Locomotion Level;Transfers;Sit;Stand;Dressing;Toileting;Hygiene/Grooming;Bed Mobility;Bathing    Examination-Participation Restrictions  Occupation;Community Activity;Church    Stability/Clinical Decision Making Evolving/Moderate complexity    Rehab Potential Fair    PT Frequency 2x / week    PT Duration 12 weeks    PT Treatment/Interventions ADLs/Self Care Home Management;Functional mobility training;Therapeutic activities;Therapeutic exercise;Balance training;Patient/family education;Orthotic Fit/Training;Wheelchair mobility training;Manual techniques;Passive range of motion;Neuromuscular re-education;DME Instruction    PT Next Visit Plan Discharge next session, check LTG    Consulted and Agree with Plan of Care Patient;Family member/caregiver             Patient will benefit from skilled therapeutic intervention in order to improve the following deficits and impairments:  Decreased range of motion, Impaired tone, Difficulty walking, Decreased activity tolerance, Decreased balance, Impaired flexibility, Decreased mobility, Decreased strength, Postural dysfunction  Visit Diagnosis: 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)  Unsteadiness on feet  Muscle weakness (generalized)  Other abnormalities of gait and mobility     Problem List Patient Active Problem List   Diagnosis Date Noted   At high risk for inadequate nutritional intake 07/12/2021   Transaminitis 06/06/2021   Intraparenchymal hematoma of brain 06/04/2021   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[place]) 05/30/2021   Right lower lobe pneumonia 05/30/2021   Prediabetes 05/30/2021   Hyperglycemia 05/30/2021   Intracerebral hemorrhage 05/24/2021   Seizure ([place]) 05/23/2021   Malnutrition of moderate degree 05/22/2021   Memory deficits    OSA (obstructive sleep apnea)    Generalized OA    Hypokalemia    Leukocytosis    Annual physical exam 04/03/2017    Kerrie Pleasure, PT 01/11/2022, 1:52 PM  [facility] [address] [place], [place], [address] Phone: [phone]   Fax:  [phone]  Name: Alecxis Baltzell MRN: [id number] Date of Birth: 10/15/1956

## 2022-01-11 NOTE — Therapy (Signed)
[facility] [address] [place], [place], [address] Phone: [phone]   Fax:  [phone]  Occupational Therapy Treatment  Patient Details  Name: Donald [name] MRN: [id number] Date of Birth: [birth year] Referring Provider (OT): Dr. Naaman Plummer   Encounter Date: 01/11/2022   OT End of Session - 01/11/22 1647     Visit Number 21    Number of Visits 30    Date for OT Re-Evaluation 02/08/22    Authorization Type [employer]    Authorization Time Period renewed 12/14/21 for 8 weeks    Authorization - Visit Number 21    Progress Note Due on Visit 29    OT Start Time 1150    OT Stop Time 1230    OT Time Calculation (min) 40 min    Activity Tolerance Patient tolerated treatment well             Past Medical History:  Diagnosis Date   Arthritis     Past Surgical History:  Procedure Laterality Date   APPENDECTOMY     IR ANGIO INTRA EXTRACRAN SEL COM CAROTID INNOMINATE BILAT MOD SED  05/28/2021   IR ANGIO VERTEBRAL SEL VERTEBRAL BILAT MOD SED  05/28/2021   IR IVC FILTER PLMT / S&I /IMG GUID/MOD SED  05/27/2021   IR US GUIDE VASC ACCESS RIGHT  05/27/2021    There were no vitals filed for this visit.   Subjective Assessment - 01/11/22 1641     Subjective  [place] is such a beautiful places - people are so good    Patient is accompanied by: Family member    Pertinent History 66 y.o. male hospitlized with  [place] R frontal lobe and small adjacent  SAH, pt transferred to rehab then he exeperienced worsening midline shift of R MCA, R ACA and DVT in RLE    Limitations hx of seizure, no estim without MD clearance, hx of memory deficit OSA    Patient Stated Goals be more independent    Currently in Pain? No/denies    Pain Score 0-No pain                          OT Treatments/Exercises (OP) - 01/11/22 0001       ADLs   ADL Comments Reviewed remaining goals for OT, and plan for discharge.  Patient entered today in  wheelchair, but positioned on sacrum - he had slid forward in wheelchair.  Wife has added a harness to wheelchair to prevent slipping out.  Patient has met some of his remaining long term goals.      Cognitive Exercises   Other Cognitive Exercises 1 Patient very pleasant and confused.  He frequently talks about getting up and going across the room to speak to someone, or to get a drink of water, when he is not able to stand and walk.  Patient's wife reports that she is noticing periods of time at home where patient is more amenable to care, and to exercise.  She reports periods of longer focused attention.  Difficult to see in busy gym environment.      Neurological Re-education Exercises   Other Exercises 1 Range of motion to left shoulder while seated in straight backed chair, to reduce patient's tendency to fall backward.                      OT Short Term Goals - 01/11/22 1649  OT SHORT TERM GOAL #1   Title Pt's family with be I with inital HEP.    Baseline Min verbal/tactile cues for don/doff    Period Weeks    Status Achieved    Target Date 10/28/21      OT SHORT TERM GOAL #2   Title Pt's family with be I with splint wear, care and precautions and LUE positioning to minimize pain and contracture.    Period Weeks    Status Achieved      OT SHORT TERM GOAL #3   Title Pt will consistently wash face and chest with no more than min A.    Time 4    Period Weeks    Status Partially Met   per wife - washing face not chest     OT SHORT TERM GOAL #4   Title Pt will attend to left hemibody space at least 10% of the time with no more than mod v.c    Period Weeks    Status Achieved      OT SHORT TERM GOAL #5   Title Pt will assist with pulling down shirt in front at least 25% of them with min v.c    Time 4    Period Weeks    Status Achieved   20%     OT SHORT TERM GOAL #6   Title Pt will follow a simple  1 step command consistently 75% of the time during therapy.     Time 4    Period Weeks    Status Achieved   follows 1 step commands inconsistently     OT SHORT TERM GOAL #7   Title Pt will consistently maintain eyes open for 20 mins during therapy sessions.    Time 4    Period Weeks    Status Achieved               OT Long Term Goals - 01/11/22 1203       OT LONG TERM GOAL #1   Title Pt will donn shirt with mod A, and mod v.c    Status Not Met   max A pt is doing 60%     OT LONG TERM GOAL #2   Title Pt will attend to L hemibody space during ADLs and functional activities at least 25% of the time with min v.c    Status Not Met      OT LONG TERM GOAL #3   Title Pt will perfom UB bathing with min A    Revised Pt will perform UB bathing with mod A    Status Not Met   40%- max A     OT LONG TERM GOAL #4   Title Pt will attend to a simple ADL or functional task for at least 15 mins with no more than min v.c  Revised-Pt will keep his eyes open for at least 30 mins with no more than min prompts.    Status Achieved      OT LONG TERM GOAL #5   Title Pt will tolerate P/ROM shoulder flexion to 90* with pain no greater than 2/10 in prep for dressing.    Status Achieved      OT LONG TERM GOAL #6   Title Pt will perform toilet transfers with mod A.    Status Not Met   pt is using steady at home                  Plan -  01/11/22 1648     Clinical Impression Statement Patient and wife agreeable to take a break from further therapy at this time.  Patient's wife has observed every therapy session, and has made attempts to carryover cueing and activities at home.  Patient continues to have severe physical involvement, and pronounced cognitive dysfunction.    OT Occupational Profile and History Detailed Assessment- Review of Records and additional review of physical, cognitive, psychosocial history related to current functional performance    Occupational performance deficits (Please refer to evaluation for details): ADL's;IADL's;Rest and  Sleep;Play;Leisure;Social Participation    Body Structure / Function / Physical Skills ADL;UE functional use;Flexibility;Pain;Vision;FMC;Proprioception;ROM;Gait;Coordination;GMC;Sensation;Decreased knowledge of precautions;Decreased knowledge of use of DME;Dexterity;Mobility;Tone;Strength;IADL    Cognitive Skills Attention;Energy/Drive;Memory;Orientation;Perception;Problem Solve;Safety Awareness;Sequencing;Thought;Understand    Rehab Potential Good    Clinical Decision Making Several treatment options, min-mod task modification necessary    Comorbidities Affecting Occupational Performance: May have comorbidities impacting occupational performance    Modification or Assistance to Complete Evaluation  Min-Moderate modification of tasks or assist with assess necessary to complete eval    OT Frequency 2x / week    OT Duration 8 weeks    OT Treatment/Interventions Self-care/ADL training;Ultrasound;Visual/perceptual remediation/compensation;Patient/family education;Balance training;Passive range of motion;Gait Training;Paraffin;Cryotherapy;Fluidtherapy;Splinting;Therapist, nutritional;Contrast Bath;Moist Heat;Therapeutic exercise;Manual Therapy;Therapeutic activities;Cognitive remediation/compensation;Neuromuscular education    Plan discharge    Consulted and Agree with Plan of Care Patient;Family member/caregiver    Family Member Consulted wife             Patient will benefit from skilled therapeutic intervention in order to improve the following deficits and impairments:   Body Structure / Function / Physical Skills: ADL, UE functional use, Flexibility, Pain, Vision, FMC, Proprioception, ROM, Gait, Coordination, GMC, Sensation, Decreased knowledge of precautions, Decreased knowledge of use of DME, Dexterity, Mobility, Tone, Strength, IADL Cognitive Skills: Attention, Energy/Drive, Memory, Orientation, Perception, Problem Solve, Safety Awareness, Sequencing, Thought, Understand     Visit  Diagnosis: 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)  Unsteadiness on feet  Muscle weakness (generalized)  Abnormal posture  Stiffness of left wrist, not elsewhere classified  Other lack of coordination  Stiffness of left shoulder, not elsewhere classified    Problem List Patient Active Problem List   Diagnosis Date Noted   At high risk for inadequate nutritional intake 07/12/2021   Transaminitis 06/06/2021   Intraparenchymal hematoma of brain 06/04/2021   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[place]) 05/30/2021   Right lower lobe pneumonia 05/30/2021   Prediabetes 05/30/2021   Hyperglycemia 05/30/2021   Intracerebral hemorrhage 05/24/2021   Seizure ([place]) 05/23/2021   Malnutrition of moderate degree 05/22/2021   Memory deficits    OSA (obstructive sleep apnea)    Generalized OA    Hypokalemia    Leukocytosis    Annual physical exam 04/03/2017    Mariah Milling, OT 01/11/2022, 4:50 PM  [facility] [address] [place], [place], [address] Phone: [phone]   Fax:  [phone]  Name: Donald [name] MRN: [id number] Date of Birth: [birth year]

## 2022-02-09 ENCOUNTER — Encounter
Payer: No Typology Code available for payment source | Attending: Physical Medicine & Rehabilitation | Admitting: Physical Medicine & Rehabilitation

## 2022-02-09 ENCOUNTER — Encounter: Payer: Self-pay | Admitting: Physical Medicine & Rehabilitation

## 2022-02-09 ENCOUNTER — Other Ambulatory Visit: Payer: Self-pay

## 2022-02-09 VITALS — BP 113/76 | HR 89 | Temp 98.1°F | Ht 65.0 in

## 2022-02-09 DIAGNOSIS — G8114 Spastic hemiplegia affecting left nondominant side: Secondary | ICD-10-CM

## 2022-02-09 DIAGNOSIS — R413 Other amnesia: Secondary | ICD-10-CM | POA: Diagnosis not present

## 2022-02-09 DIAGNOSIS — Y999 Unspecified external cause status: Secondary | ICD-10-CM | POA: Insufficient documentation

## 2022-02-09 DIAGNOSIS — S06340S Traumatic hemorrhage of right cerebrum without loss of consciousness, sequela: Secondary | ICD-10-CM | POA: Diagnosis present

## 2022-02-09 NOTE — Progress Notes (Signed)
? ?Subjective:  ? ? Patient ID: Donald [name], male    DOB: 07/12/1956, 66 y.o.   MRN: [id number] ? ?HPI ? ?Donald [name] is here in follow-up of his right intraparenchymal hemorrhage.  Our plan initially today was to perform Botox to his left upper extremity as well as his neck.  Apparently there were complications with the prior authorization and it was never completed.  Overall Donald [name] notes some improvement in her husband's cognition.  He still deals with a lot of spasticity related to the injury however.  They are still waiting on their power wheelchair.  He is off all medications really at this point except for some supplements and vitamins.  Wife is giving him ginkgo biloba as well as other supplements to help with his energy and concentration.  She does feel that these have helped.  His mood overall seems to be improved and he has been eating better. ? ?Pain Inventory ?Average Pain 0 ?Pain Right Now 10 ?My pain is sharp ? ?LOCATION OF PAIN  abdomen ? ?BOWEL ?Number of stools per week: 4-6 ? ? ?BLADDER ?Normal and Pads ? ? ?Mobility ?use a wheelchair ?needs help with transfers ? ?Function ?I need assistance with the following:  feeding, dressing, bathing, toileting, meal prep, household duties, and shopping ? ?Neuro/Psych ?weakness ?tremor ?trouble walking ?confusion ?depression ?anxiety ? ?Prior Studies ?Any changes since last visit?  no ? ?Physicians involved in your care ?Any changes since last visit?  no ? ? ?Family History  ?Problem Relation Age of Onset  ? Hypertension Mother   ? Colon polyps Neg Hx   ? Colon cancer Neg Hx   ? Esophageal cancer Neg Hx   ? Rectal cancer Neg Hx   ? Stomach cancer Neg Hx   ? ?Social History  ? ?Socioeconomic History  ? Marital status: Married  ?  Spouse name: Not on file  ? Number of children: Not on file  ? Years of education: Not on file  ? Highest education level: Not on file  ?Occupational History  ? Not on file  ?Tobacco Use  ? Smoking status: Never  ? Smokeless  tobacco: Never  ?Vaping Use  ? Vaping Use: Never used  ?Substance and Sexual Activity  ? Alcohol use: No  ? Drug use: No  ? Sexual activity: Yes  ?  Birth control/protection: None  ?Other Topics Concern  ? Not on file  ?Social History Narrative  ? Not on file  ? ?Social Determinants of Health  ? ?Financial Resource Strain: Not on file  ?Food Insecurity: Not on file  ?Transportation Needs: Not on file  ?Physical Activity: Not on file  ?Stress: Not on file  ?Social Connections: Not on file  ? ?Past Surgical History:  ?Procedure Laterality Date  ? APPENDECTOMY    ? IR ANGIO INTRA EXTRACRAN SEL COM CAROTID INNOMINATE BILAT MOD SED  05/28/2021  ? IR ANGIO VERTEBRAL SEL VERTEBRAL BILAT MOD SED  05/28/2021  ? IR IVC FILTER PLMT / S&I /IMG GUID/MOD SED  05/27/2021  ? IR US GUIDE VASC ACCESS RIGHT  05/27/2021  ? ?Past Medical History:  ?Diagnosis Date  ? Arthritis   ? ?BP 113/76   Pulse 89   Temp 98.1 ?F (36.7 ?C)   Ht 5\' 5"  (1.651 m)   SpO2 96%   BMI 23.52 kg/m?  ? ?Opioid Risk Score:   ?Fall Risk Score:  `1 ? ?Depression screen PHQ 2/9 ? ?Depression screen [facility] 2/9 02/09/2022 09/08/2021 08/12/2021 11/09/2020  10/26/2020 09/09/2020 04/27/2020  ?Decreased Interest 0 0 1 0 0 0 0  ?Down, Depressed, Hopeless 0 0 1 0 0 0 0  ?PHQ - 2 Score 0 0 2 0 0 0 0  ?Altered sleeping - 0 0 - - - -  ?Tired, decreased energy - 0 1 - - - -  ?Change in appetite - 0 0 - - - -  ?Feeling bad or failure about yourself  - 0 0 - - - -  ?Trouble concentrating - 0 1 - - - -  ?Moving slowly or fidgety/restless - 0 0 - - - -  ?Suicidal thoughts - - 0 - - - -  ?PHQ-9 Score - 0 4 - - - -  ?  ? ?Review of Systems  ?Constitutional: Negative.   ?HENT: Negative.    ?Eyes: Negative.   ?Respiratory: Negative.    ?Cardiovascular: Negative.   ?Gastrointestinal: Negative.   ?Endocrine: Negative.   ?Genitourinary: Negative.   ?Musculoskeletal:  Positive for gait problem and myalgias.  ?Skin: Negative.   ?Allergic/Immunologic: Negative.   ?Neurological:  Positive for tremors  and weakness.  ?Hematological: Negative.   ?Psychiatric/Behavioral:  Positive for confusion and dysphoric mood. The patient is nervous/anxious.   ? ?   ?Objective:  ? Physical Exam ? ?General: No acute distress ?HEENT: NCAT, EOMI, oral membranes moist ?Cards: reg rate  ?Chest: normal effort ?Abdomen: Soft, NT, ND ?Skin: dry, intact ?Extremities: no edema ?Psych: pleasant and appropriate  ?Skin: intact ?Neuro: Patient more attentive.  He is oriented to person and his wife.  He was able to say my name today.  He is very alert.  Speech is more clear.  He still leans to the left because of his spasticity.  Heads rotated to the right and leaning to the left.  Tone in the sternocleidomastoid is 3 out of 4.  Left biceps tone is 3 out of 4 as well.  Finger flexors are 2-3 out of 4.  Hamstrings are 2-3 out of 4.  Motor function is grossly 0 out of 5 on the left side throughout.  He remains sensitive to range of motion especially at the shoulder and elbow.r.  ?Musculoskeletal:  left shoulder tender with limited range of motion.  There appears to be some spasms along the left rectus muscles of the abdomen. ?  ?  ?  ?  ?  ?Assessment & Plan:  ?  ?1.  Intraparenchymal hematoma (possibly related to cerebral angioid and myopathy) of brain with dense left sided hemiplegia, aphasia, and dysphagia.  ?            -therapy on hold pending botox ?             -Awaiting power wheelchair ?             -Cognition getting closer to baseline.    ?2.  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   ?-DVT/anticoagulation:  Has IVCF ?-no a/c d/t CAA ?3. Low back pain, left hemiplegic shoulder, ?HO left hip,/neuropathic pain:   ?  ?4.  Spastic left hemiparesis ?Needs appropriate chair and ongoing ROM, HEP, outpt therapies ?-positioning in bed/chair was discussed today.  Would benefit from a neck roll to help support his head better when sitting. ?-will seek for botox 400u left biceps, brachioradialis, left scm.  need prior  authorization ?Increase magnesium supplement which may help his tone to a certain extent ?-He is unable to tolerate prescription antispasmodics ?5. Nutrition: eating much better ?6.  Bowels and bladder: condom cath, wife continues to work on continence at home ?  -abdominal pain appears to be spastic ?  ?15 minutes of face to face patient care time were spent during this visit. All questions were encouraged and answered.  Follow up with me over the next 1-2 weeks for Botox injections  ? ? ? ?    ? ?

## 2022-02-09 NOTE — Patient Instructions (Signed)
PLEASE FEEL FREE TO CALL OUR OFFICE WITH ANY PROBLEMS OR QUESTIONS ([phone])      

## 2022-02-16 ENCOUNTER — Encounter (HOSPITAL_BASED_OUTPATIENT_CLINIC_OR_DEPARTMENT_OTHER): Payer: No Typology Code available for payment source | Admitting: Physical Medicine & Rehabilitation

## 2022-02-16 ENCOUNTER — Encounter: Payer: Self-pay | Admitting: Physical Medicine & Rehabilitation

## 2022-02-16 ENCOUNTER — Other Ambulatory Visit: Payer: Self-pay

## 2022-02-16 VITALS — BP 122/78 | HR 73

## 2022-02-16 DIAGNOSIS — S06340S Traumatic hemorrhage of right cerebrum without loss of consciousness, sequela: Secondary | ICD-10-CM | POA: Diagnosis not present

## 2022-02-16 DIAGNOSIS — G8114 Spastic hemiplegia affecting left nondominant side: Secondary | ICD-10-CM

## 2022-02-16 NOTE — Patient Instructions (Signed)
PLEASE FEEL FREE TO CALL OUR OFFICE WITH ANY PROBLEMS OR QUESTIONS ([phone])      

## 2022-02-16 NOTE — Progress Notes (Signed)
Botox Injection for spasticity of upper extremity using needle EMG guidance ?Indication: 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) ? ? ?Dilution: 100 Units/ml        Total Units Injected: 400 ?Indication: Severe spasticity which interferes with ADL,mobility and/or  hygiene and is unresponsive to medication management and other conservative care ?Informed consent was obtained after describing risks and benefits of the procedure with the patient. This includes bleeding, bruising, infection, excessive weakness, or medication side effects. A REMS form is on file and signed. ? ?Needle: 46mm injectable monopolar needle electrode ? ?  ?Number of units per muscle ?Left SCM  100 units ?Pectoralis Major 0 units ?Pectoralis Minor 0 units ?Biceps 125 units ?Brachioradialis 75 units ?FCR 25 units ?FCU 25 units ?FDS 25 units ?FDP 25 units ?FPL 0 units ?Palmaris Longus 0 units ?Pronator Teres 0 units ?Pronator Quadratus 0 units ?Lumbricals 0 units ?All injections were done after obtaining appropriate EMG activity and after negative drawback for blood. The patient tolerated the procedure well. Post procedure instructions were given. Return in about 3 months (around 05/19/2022). ? ? ?

## 2022-03-16 ENCOUNTER — Ambulatory Visit: Payer: No Typology Code available for payment source | Admitting: Neurology

## 2022-03-16 ENCOUNTER — Ambulatory Visit: Payer: No Typology Code available for payment source | Admitting: Physical Medicine & Rehabilitation

## 2022-03-27 ENCOUNTER — Emergency Department (HOSPITAL_BASED_OUTPATIENT_CLINIC_OR_DEPARTMENT_OTHER)
Admission: EM | Admit: 2022-03-27 | Discharge: 2022-03-27 | Disposition: A | Discharge status: Home / Self Care | Payer: Medicare Other | Source: Home / Self Care | Attending: Emergency Medicine | Admitting: Emergency Medicine

## 2022-03-27 ENCOUNTER — Encounter (HOSPITAL_BASED_OUTPATIENT_CLINIC_OR_DEPARTMENT_OTHER): Payer: Self-pay | Admitting: Emergency Medicine

## 2022-03-27 ENCOUNTER — Emergency Department (HOSPITAL_BASED_OUTPATIENT_CLINIC_OR_DEPARTMENT_OTHER): Payer: Medicare Other

## 2022-03-27 ENCOUNTER — Other Ambulatory Visit: Payer: Self-pay

## 2022-03-27 DIAGNOSIS — R531 Weakness: Secondary | ICD-10-CM | POA: Insufficient documentation

## 2022-03-27 DIAGNOSIS — R251 Tremor, unspecified: Secondary | ICD-10-CM | POA: Insufficient documentation

## 2022-03-27 DIAGNOSIS — G40909 Epilepsy, unspecified, not intractable, without status epilepticus: Secondary | ICD-10-CM | POA: Diagnosis not present

## 2022-03-27 DIAGNOSIS — R569 Unspecified convulsions: Secondary | ICD-10-CM | POA: Diagnosis not present

## 2022-03-27 DIAGNOSIS — R252 Cramp and spasm: Secondary | ICD-10-CM | POA: Insufficient documentation

## 2022-03-27 HISTORY — DX: Cerebral infarction, unspecified: I63.9

## 2022-03-27 LAB — COMPREHENSIVE METABOLIC PANEL
ALT: 11 U/L (ref 0–44)
AST: 20 U/L (ref 15–41)
Albumin: 4.2 g/dL (ref 3.5–5.0)
Alkaline Phosphatase: 139 U/L — ABNORMAL HIGH (ref 38–126)
Anion gap: 7 (ref 5–15)
BUN: 7 mg/dL — ABNORMAL LOW (ref 8–23)
CO2: 27 mmol/L (ref 22–32)
Calcium: 10.1 mg/dL (ref 8.9–10.3)
Chloride: 104 mmol/L (ref 98–111)
Creatinine, Ser: 1.03 mg/dL (ref 0.61–1.24)
GFR, Estimated: >60 mL/min (ref >60)
Glucose, Bld: 91 mg/dL (ref 70–99)
Potassium: 4.9 mmol/L (ref 3.5–5.1)
Sodium: 138 mmol/L (ref 135–145)
Total Bilirubin: 0.6 mg/dL (ref 0.3–1.2)
Total Protein: 7 g/dL (ref 6.5–8.1)

## 2022-03-27 LAB — CBC WITH DIFFERENTIAL/PLATELET
Abs Immature Granulocytes: 0.01 10*3/uL (ref 0.00–0.07)
Basophils Absolute: 0 10*3/uL (ref 0.0–0.1)
Basophils Relative: 0 %
Eosinophils Absolute: 0.1 10*3/uL (ref 0.0–0.5)
Eosinophils Relative: 2 %
HCT: 42.9 % (ref 39.0–52.0)
Hemoglobin: 13.8 g/dL (ref 13.0–17.0)
Immature Granulocytes: 0 %
Lymphocytes Relative: 58 %
Lymphs Abs: 2.2 10*3/uL (ref 0.7–4.0)
MCH: 30.1 pg (ref 26.0–34.0)
MCHC: 32.2 g/dL (ref 30.0–36.0)
MCV: 93.7 fL (ref 80.0–100.0)
Monocytes Absolute: 0.3 10*3/uL (ref 0.1–1.0)
Monocytes Relative: 9 %
Neutro Abs: 1.2 10*3/uL — ABNORMAL LOW (ref 1.7–7.7)
Neutrophils Relative %: 31 %
Platelets: 254 10*3/uL (ref 150–400)
RBC: 4.58 MIL/uL (ref 4.22–5.81)
RDW: 13.2 % (ref 11.5–15.5)
WBC: 3.8 10*3/uL — ABNORMAL LOW (ref 4.0–10.5)
nRBC: 0 % (ref 0.0–0.2)

## 2022-03-27 LAB — MAGNESIUM: Magnesium: 2.1 mg/dL (ref 1.7–2.4)

## 2022-03-27 MED ORDER — LEVETIRACETAM IN NACL 1500 MG/100ML IV SOLN
1500.0000 mg | Freq: Once | INTRAVENOUS | Status: DC
  Filled 2022-03-27: qty 100

## 2022-03-27 MED ORDER — LEVETIRACETAM IN NACL 1000 MG/100ML IV SOLN
INTRAVENOUS | Status: AC
  Filled 2022-03-27: qty 100

## 2022-03-27 MED ORDER — LEVETIRACETAM IN NACL 500 MG/100ML IV SOLN
INTRAVENOUS | Status: AC
  Filled 2022-03-27: qty 100

## 2022-03-27 NOTE — ED Triage Notes (Signed)
Per [facility] pt coming from home with c/o right leg spasms that lasted approx 50 mins earlier today. Patient is bed bound with indwelling foley. Patient had stroke May of last year that left with left sided deficits and some cognitive deficits. Patients wife states patient recently had Botox injections to help with contractures.  ?

## 2022-03-27 NOTE — Discharge Instructions (Signed)
Please follow-up with your neurologist.  Contact their office tomorrow morning to request appointment this week.  If he has any episodes of left arm or leg shaking again, any episodes of unresponsiveness, or other new concerning symptom, please return to the emergency room for reassessment. ?

## 2022-03-27 NOTE — ED Provider Notes (Signed)
?[facility] EMERGENCY DEPT ?Provider Note ? ? ?CSN: [id number] ?Arrival date & time: 03/27/22  1501 ? ?  ? ?History ? ?Chief Complaint  ?Patient presents with  ? Leg Spasms  ? ? ?Donald [name] is a 66 y.o. male.  Presented to emergency room with concern for leg spasm.  Wife reports that patient had episode of spasm and his left leg.  Happened for about 5 minutes, left leg and left arm seem to be shaking.  Patient did not lose consciousness but he seemed to be breathing a little bit faster and seem to be more lethargic.  The episode stopped and then patient has not had any subsequent shaking in his extremity.  Wife reports that patient had similar episode when he was admitted back in 2022 and EEG at the time was negative and he was advised not to take any antiseizure medications due to the prior EEG results.   ? ?Completed chart review, has seen Dr. Pearlean Brownie in clinic,.  Patient has a history of hemorrhagic stroke with residual left-sided deficits. ? ?Patient denies any symptoms at this time. ? ?HPI ? ?  ? ?Home Medications ?Prior to Admission medications   ?Medication Sig Start Date End Date Taking? Authorizing Provider  ?amLODipine (NORVASC) 2.5 MG tablet Take 1 tablet (2.5 mg total) by mouth daily. ?Patient not taking: Reported on 02/09/2022 10/11/21   [name], MD  ?Multiple Vitamin (MULTIVITAMIN) tablet Take 1 tablet by mouth daily.    [provider]  ?   ? ?Allergies    ?Sulfa antibiotics   ? ?Review of Systems   ?Review of Systems  ?Constitutional:  Negative for chills and fever.  ?HENT:  Negative for ear pain and sore throat.   ?Eyes:  Negative for pain and visual disturbance.  ?Respiratory:  Negative for cough and shortness of breath.   ?Cardiovascular:  Negative for chest pain and palpitations.  ?Gastrointestinal:  Negative for abdominal pain and vomiting.  ?Genitourinary:  Negative for dysuria and hematuria.  ?Musculoskeletal:  Negative for arthralgias and back pain.  ?Skin:   Negative for color change and rash.  ?Neurological:  Positive for seizures. Negative for syncope.  ?All other systems reviewed and are negative. ? ?Physical Exam ?Updated Vital Signs ?BP 125/78   Pulse 63   Temp 99.1 ?F (37.3 ?C) (Oral)   Resp 16   Ht 5\' 5"  (1.651 m)   Wt 68 kg   SpO2 92%   BMI 24.96 kg/m?  ?Physical Exam ?Vitals and nursing note reviewed.  ?Constitutional:   ?   General: He is not in acute distress. ?   Appearance: He is well-developed.  ?HENT:  ?   Head: Normocephalic and atraumatic.  ?Eyes:  ?   Conjunctiva/sclera: Conjunctivae normal.  ?Cardiovascular:  ?   Rate and Rhythm: Normal rate and regular rhythm.  ?   Heart sounds: No murmur heard. ?Pulmonary:  ?   Effort: Pulmonary effort is normal. No respiratory distress.  ?   Breath sounds: Normal breath sounds.  ?Abdominal:  ?   Palpations: Abdomen is soft.  ?   Tenderness: There is no abdominal tenderness.  ?Musculoskeletal:     ?   General: No swelling.  ?   Cervical back: Neck supple.  ?Skin: ?   General: Skin is warm and dry.  ?   Capillary Refill: Capillary refill takes less than 2 seconds.  ?Neurological:  ?   Mental Status: He is alert.  ?   Comments: Alert, answers  basic questions appropriately, baseline left-sided deficits present  ?Psychiatric:     ?   Mood and Affect: Mood normal.  ? ? ?ED Results / Procedures / Treatments   ?Labs ?(all labs ordered are listed, but only abnormal results are displayed) ?Labs Reviewed  ?COMPREHENSIVE METABOLIC PANEL - Abnormal; Notable for the following components:  ?    Result Value  ? BUN 7 (*)   ? Alkaline Phosphatase 139 (*)   ? All other components within normal limits  ?CBC WITH DIFFERENTIAL/PLATELET - Abnormal; Notable for the following components:  ? WBC 3.8 (*)   ? Neutro Abs 1.2 (*)   ? All other components within normal limits  ?MAGNESIUM  ?CBC WITH DIFFERENTIAL/PLATELET  ? ? ?EKG ?None ? ?Radiology ?CT Head Wo Contrast ? ?Result Date: 03/27/2022 ?CLINICAL DATA:  Seizure EXAM: CT HEAD  WITHOUT CONTRAST TECHNIQUE: Contiguous axial images were obtained from the base of the skull through the vertex without intravenous contrast. RADIATION DOSE REDUCTION: This exam was performed according to the departmental dose-optimization program which includes automated exposure control, adjustment of the mA and/or kV according to patient size and/or use of iterative reconstruction technique. COMPARISON:  CT head 05/27/2021 FINDINGS: Brain: No acute intracranial hemorrhage, mass effect, or herniation. No extra-axial fluid collections. No evidence of acute territorial infarct. No hydrocephalus. Mild cortical volume loss. Patchy hypodensities in the periventricular and subcortical white matter, likely secondary to chronic microvascular ischemic changes. Area of encephalomalacia in the high right frontoparietal region secondary to previous infarct. Vascular: No hyperdense vessel or unexpected calcification. Skull: Normal. Negative for fracture or focal lesion. Sinuses/Orbits: No acute finding. Chronic mild-to-moderate mucosal thickening in the maxillary sinuses left-greater-than-right. Other: None. IMPRESSION: Chronic changes as described with no acute intracranial process identified. Electronically Signed   By: Jannifer Hickelaney  Williams M.D.   On: 03/27/2022 16:48   ? ?Procedures ?Procedures  ? ? ?Medications Ordered in ED ?Medications - No data to display ? ? ?ED Course/ Medical Decision Making/ A&P ?  ?                        ?Medical Decision Making ?Amount and/or Complexity of Data Reviewed ?Labs: ordered. ?Radiology: ordered. ? ?Risk ?Prescription drug management. ? ? ?66 year old gentleman with medical history of prior hemorrhagic stroke and residual left-sided weakness presented to the emergency department with concern for shaking episode of his left leg and left arm.  Based on wife's description of episode, highly suspicious for possibility of focal seizure, particularly given his stroke history.  Check CT head, no  acute findings.  I independently reviewed CT.  Basic labs stable, no anemia or electrolyte derangement.  I discussed the case with Dr. Ezzie DuralSal with neurology.  He advises loading patient with Keppra and discharging on course of oral Keppra and having patient follow-up closely with his regular neurologist.  I discussed these recommendations with wife.  She states that she did not like the way the prior antiepileptics made her husband feel and they do not want to complete this treatment that was recommended by the neurologist.  They are agreeable to follow-up with their primary neurologist in clinic and are agreeable to return if he should have any additional episodes.  Discussed the risks and benefits of pursuing this plan.  Wife still insistent that she did not want Keppra or any other alternative antiepileptics at this time.  Will discharge patient home.  I did stressed return precautions should he have any additional episodes. ? ? ? ? ? ? ? ?  Final Clinical Impression(s) / ED Diagnoses ?Final diagnoses:  ?Episode of shaking  ? ? ?Rx / DC Orders ?ED Discharge Orders   ? ? None  ? ?  ? ? ?  ?Milagros Loll, MD ?03/28/22 0036 ? ?

## 2022-03-29 ENCOUNTER — Inpatient Hospital Stay (HOSPITAL_COMMUNITY)
Admission: EM | Admit: 2022-03-29 | Discharge: 2022-04-07 | DRG: 101 | Disposition: A | Discharge status: Home Health | Payer: Medicare Other | Attending: Internal Medicine | Admitting: Internal Medicine

## 2022-03-29 ENCOUNTER — Encounter (HOSPITAL_COMMUNITY): Payer: Self-pay | Admitting: Emergency Medicine

## 2022-03-29 ENCOUNTER — Inpatient Hospital Stay (HOSPITAL_COMMUNITY): Payer: Medicare Other

## 2022-03-29 ENCOUNTER — Emergency Department (HOSPITAL_COMMUNITY): Payer: Medicare Other

## 2022-03-29 ENCOUNTER — Other Ambulatory Visit: Payer: Self-pay

## 2022-03-29 DIAGNOSIS — F0150 Vascular dementia without behavioral disturbance: Secondary | ICD-10-CM | POA: Diagnosis present

## 2022-03-29 DIAGNOSIS — G8114 Spastic hemiplegia affecting left nondominant side: Secondary | ICD-10-CM | POA: Diagnosis present

## 2022-03-29 DIAGNOSIS — R748 Abnormal levels of other serum enzymes: Secondary | ICD-10-CM | POA: Diagnosis present

## 2022-03-29 DIAGNOSIS — Z8249 Family history of ischemic heart disease and other diseases of the circulatory system: Secondary | ICD-10-CM

## 2022-03-29 DIAGNOSIS — Z79899 Other long term (current) drug therapy: Secondary | ICD-10-CM | POA: Diagnosis not present

## 2022-03-29 DIAGNOSIS — R569 Unspecified convulsions: Principal | ICD-10-CM

## 2022-03-29 DIAGNOSIS — G4733 Obstructive sleep apnea (adult) (pediatric): Secondary | ICD-10-CM | POA: Diagnosis present

## 2022-03-29 DIAGNOSIS — E7220 Disorder of urea cycle metabolism, unspecified: Secondary | ICD-10-CM | POA: Diagnosis not present

## 2022-03-29 DIAGNOSIS — Z7401 Bed confinement status: Secondary | ICD-10-CM

## 2022-03-29 DIAGNOSIS — T426X5A Adverse effect of other antiepileptic and sedative-hypnotic drugs, initial encounter: Secondary | ICD-10-CM | POA: Diagnosis not present

## 2022-03-29 DIAGNOSIS — R4189 Other symptoms and signs involving cognitive functions and awareness: Secondary | ICD-10-CM | POA: Diagnosis present

## 2022-03-29 DIAGNOSIS — R400 Somnolence: Secondary | ICD-10-CM | POA: Diagnosis not present

## 2022-03-29 DIAGNOSIS — Z882 Allergy status to sulfonamides status: Secondary | ICD-10-CM

## 2022-03-29 DIAGNOSIS — I69154 Hemiplegia and hemiparesis following nontraumatic intracerebral hemorrhage affecting left non-dominant side: Secondary | ICD-10-CM | POA: Diagnosis not present

## 2022-03-29 DIAGNOSIS — R7303 Prediabetes: Secondary | ICD-10-CM | POA: Diagnosis present

## 2022-03-29 DIAGNOSIS — I69398 Other sequelae of cerebral infarction: Secondary | ICD-10-CM | POA: Diagnosis not present

## 2022-03-29 DIAGNOSIS — Z8673 Personal history of transient ischemic attack (TIA), and cerebral infarction without residual deficits: Secondary | ICD-10-CM

## 2022-03-29 DIAGNOSIS — F0390 Unspecified dementia without behavioral disturbance: Secondary | ICD-10-CM | POA: Diagnosis not present

## 2022-03-29 DIAGNOSIS — Z993 Dependence on wheelchair: Secondary | ICD-10-CM

## 2022-03-29 DIAGNOSIS — G9389 Other specified disorders of brain: Secondary | ICD-10-CM | POA: Diagnosis present

## 2022-03-29 DIAGNOSIS — M1990 Unspecified osteoarthritis, unspecified site: Secondary | ICD-10-CM | POA: Diagnosis present

## 2022-03-29 DIAGNOSIS — G40909 Epilepsy, unspecified, not intractable, without status epilepticus: Secondary | ICD-10-CM | POA: Diagnosis present

## 2022-03-29 DIAGNOSIS — I10 Essential (primary) hypertension: Secondary | ICD-10-CM | POA: Diagnosis present

## 2022-03-29 DIAGNOSIS — I69192 Facial weakness following nontraumatic intracerebral hemorrhage: Secondary | ICD-10-CM

## 2022-03-29 DIAGNOSIS — G9349 Other encephalopathy: Secondary | ICD-10-CM | POA: Diagnosis present

## 2022-03-29 LAB — URINALYSIS, ROUTINE W REFLEX MICROSCOPIC
Bilirubin Urine: NEGATIVE
Glucose, UA: NEGATIVE mg/dL
Hgb urine dipstick: NEGATIVE
Ketones, ur: NEGATIVE mg/dL
Leukocytes,Ua: NEGATIVE
Nitrite: NEGATIVE
Protein, ur: NEGATIVE mg/dL
Specific Gravity, Urine: 1.024 (ref 1.005–1.030)
pH: 6 (ref 5.0–8.0)

## 2022-03-29 LAB — CBC
HCT: 45.5 % (ref 39.0–52.0)
Hemoglobin: 13.4 g/dL (ref 13.0–17.0)
MCH: 30 pg (ref 26.0–34.0)
MCHC: 29.5 g/dL — ABNORMAL LOW (ref 30.0–36.0)
MCV: 102 fL — ABNORMAL HIGH (ref 80.0–100.0)
Platelets: 102 10*3/uL — ABNORMAL LOW (ref 150–400)
RBC: 4.46 MIL/uL (ref 4.22–5.81)
RDW: 13.1 % (ref 11.5–15.5)
WBC: 3.7 10*3/uL — ABNORMAL LOW (ref 4.0–10.5)
nRBC: 0 % (ref 0.0–0.2)

## 2022-03-29 LAB — BASIC METABOLIC PANEL
Anion gap: 7 (ref 5–15)
BUN: 11 mg/dL (ref 8–23)
CO2: 21 mmol/L — ABNORMAL LOW (ref 22–32)
Calcium: 9 mg/dL (ref 8.9–10.3)
Chloride: 110 mmol/L (ref 98–111)
Creatinine, Ser: 1.12 mg/dL (ref 0.61–1.24)
GFR, Estimated: >60 mL/min (ref >60)
Glucose, Bld: 92 mg/dL (ref 70–99)
Potassium: 3.7 mmol/L (ref 3.5–5.1)
Sodium: 138 mmol/L (ref 135–145)

## 2022-03-29 MED ORDER — SENNOSIDES-DOCUSATE SODIUM 8.6-50 MG PO TABS: 1.0000 | ORAL_TABLET | Freq: Every evening | ORAL | Status: DC | PRN

## 2022-03-29 MED ORDER — VALPROATE SODIUM 100 MG/ML IV SOLN
1500.0000 mg | Freq: Once | INTRAVENOUS | Status: AC
  Administered 2022-03-29: 1500 mg via INTRAVENOUS
  Filled 2022-03-29: qty 15

## 2022-03-29 MED ORDER — ONDANSETRON HCL 4 MG PO TABS: 4.0000 mg | ORAL_TABLET | Freq: Four times a day (QID) | ORAL | Status: DC | PRN

## 2022-03-29 MED ORDER — ADULT MULTIVITAMIN W/MINERALS CH
1.0000 | ORAL_TABLET | Freq: Every day | ORAL | Status: DC
  Administered 2022-03-30 – 2022-04-07 (×8): 1 via ORAL
  Filled 2022-03-29 (×8): qty 1

## 2022-03-29 MED ORDER — VALPROATE SODIUM 100 MG/ML IV SOLN
500.0000 mg | Freq: Three times a day (TID) | INTRAVENOUS | Status: DC
  Administered 2022-03-30 – 2022-04-02 (×11): 500 mg via INTRAVENOUS
  Filled 2022-03-29 (×13): qty 5

## 2022-03-29 MED ORDER — LORAZEPAM 2 MG/ML IJ SOLN
INTRAMUSCULAR | Status: AC
  Filled 2022-03-29: qty 1

## 2022-03-29 MED ORDER — SODIUM CHLORIDE 0.9 % IV SOLN: INTRAVENOUS | Status: DC

## 2022-03-29 MED ORDER — ACETAMINOPHEN 650 MG RE SUPP: 650.0000 mg | Freq: Four times a day (QID) | RECTAL | Status: DC | PRN

## 2022-03-29 MED ORDER — ENOXAPARIN SODIUM 40 MG/0.4ML IJ SOSY
40.0000 mg | PREFILLED_SYRINGE | INTRAMUSCULAR | Status: DC
  Administered 2022-03-29: 40 mg via SUBCUTANEOUS
  Filled 2022-03-29: qty 0.4

## 2022-03-29 MED ORDER — ACETAMINOPHEN 325 MG PO TABS
650.0000 mg | ORAL_TABLET | Freq: Four times a day (QID) | ORAL | Status: DC | PRN
  Administered 2022-04-05: 650 mg via ORAL
  Filled 2022-03-29: qty 2

## 2022-03-29 MED ORDER — ONDANSETRON HCL 4 MG/2ML IJ SOLN: 4.0000 mg | Freq: Four times a day (QID) | INTRAMUSCULAR | Status: DC | PRN

## 2022-03-29 NOTE — Procedures (Signed)
Patient Name: Donald [name]  ?MRN: [id number]  ?Epilepsy Attending: Lora Havens  ?Referring Physician/Provider: August Albino, NP ?Date: 03/29/2022 ?Duration: 22.21 mins ? ?Patient history: 66 y.o. 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 She also noticed he had a right gaze preference during this episode. During these episodes he is able to talk through them, she has never noticed any incontinence. EEG to evaluate for seizure.  ? ?Level of alertness: Awake, asleep ? ?AEDs during EEG study: None ? ?Technical aspects: This EEG study was done with scalp electrodes positioned according to the 10-20 International system of electrode placement. Electrical activity was acquired at a sampling rate of 500Hz  and reviewed with a high frequency filter of 70Hz  and a low frequency filter of 1Hz . EEG data were recorded continuously and digitally stored.  ? ?Description: The posterior dominant rhythm consists of 8-9 Hz activity of moderate voltage (25-35 uV) seen predominantly in posterior head regions, symmetric and reactive to eye opening and eye closing.  Sleep was characterized by vertex waves, sleep spindles (12 to 14 Hz), maximal frontocentral region.  EEG showed continuous 5 to 6 Hz theta slowing as well as intermittent rhythmic sharply contoured 2 to 3 Hz which appears quasiperiodic at 1 Hz in right fronto-parietal region.Hyperventilation and photic stimulation were not performed.    ? ?Patient event button was pressed on 03/29/2022 at 1128 during which patient was noted to have left lower extremity jerking.  Concomitant EEG initially showed low amplitude 16 to 18 Hz beta activity in vertex region which then involved the right parieto-occipital region followed by  left parieto-occipital region and also right hemisphere and evolved in frequency to 3 to 5 Hz sharply contoured theta-delta slowing.  This episode lasted for about 45 seconds ? ?Two more seizures without clinical signs were noted on 03/29/2022 at 1128 and 1134, lasting 18 seconds and 2-minute respectively. ?Concomitant EEG showed low amplitude 16 to 18 Hz beta activity in vertex region which then involved the right parieto-occipital region followed by left parieto-occipital region and also right hemisphere and evolved in frequency to 3 to 5 Hz sharply contoured theta-delta slowing.  ? ?ABNORMALITY ?- Focal motor seizure, vertex region ?-Seizure without clinical sign, vertex region ?- Continuous slow, right fronto-parietal region. ? ?IMPRESSION: ?This study showed one focal motor seizure arising from vertex region on 03/29/2022 at 1120 during which patient was noted to have left lower extremity jerking, lasting about 45 seconds. Two more seizures clinical signs were noted on 04/10/2022 at 1128 and 1134.  Lasting 18 seconds and 2 minutes respectively. ? ?Additionally this study is suggestive of cortical dysfunction arising from right fronto-parietal region, likely secondary to underlying ICH.  ? ?Dr Rory Percy was notified. ? ?Lora Havens  ? ?

## 2022-03-29 NOTE — Consult Note (Signed)
Neurology Consultation ? ?Reason for Consult: left side seizure like activity ?Referring Physician: Dr. Eudelia Bunchardama ? ?CC: seizure like activity  ? ?History is obtained from:wife at bedside  ? ?HPI: Donald [name] is a 66 y.o. 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 She also noticed he had a right gaze preference during this episode. During these episodes he is able to talk through them, she has never noticed any incontinence, However does wear a condom cath, and after episode usually returns back to normal. Per wife, he had botox in March 2023 for treatment of spasticity, which she states has helped and he does have more movement with the left side. She states he has been on Keppra before and that he had terrible side effects from Keppra such as behavioral disturbance, lethargy and paranoia. Neurology was consulted for assistance  ? ?Premorbid modified Rankin scale (mRS): ?5-Severe disability-bedridden, incontinent, needs constant attention ? ?ROS: Full ROS was performed and is negative except as noted in the HPI.   ? ?Past Medical History:  ?Diagnosis Date  ? Arthritis   ? Stroke [facility](HCC)   ? ? ? ?Family History  ?Problem Relation Age of Onset  ? Hypertension Mother   ? Colon polyps Neg Hx   ? Colon cancer Neg Hx   ? Esophageal cancer Neg Hx   ? Rectal cancer Neg Hx   ? Stomach cancer Neg Hx   ? ? ? ?Social History:  ? reports that he has never smoked. He has never used smokeless tobacco. He reports that he does not drink alcohol and does not use drugs. ? ?Medications ?No current facility-administered medications for this encounter. ? ?Current Outpatient Medications:  ?  amLODipine (NORVASC) 2.5 MG tablet, Take 1 tablet (2.5 mg total) by mouth daily. (Patient not  taking: Reported on 02/09/2022), Disp: 90 tablet, Rfl: 0 ?  Multiple Vitamin (MULTIVITAMIN) tablet, Take 1 tablet by mouth daily., Disp: , Rfl:  ? ? ?Exam: ?Current vital signs: ?BP 123/77   Pulse (!) 57   Temp (!) 97.3 ?F (36.3 ?C) (Temporal)   Resp 15   Ht 5\' 5"  (1.651 m)   Wt 68 kg   SpO2 100%   BMI 24.95 kg/m?  ?Vital signs in last 24 hours: ?Temp:  [97.3 ?F (36.3 ?C)] 97.3 ?F (36.3 ?C) (04/18 0600) ?Pulse Rate:  [57-64] 57 (04/18 0830) ?Resp:  [13-15] 15 (04/18 0830) ?BP: (118-138)/(73-88) 123/77 (04/18 0830) ?SpO2:  [98 %-100 %] 100 % (04/18 0830) ?Weight:  [68 kg] 68 kg (04/18 0618) ? ?GENERAL: Awake, alert in NAD ?HEENT: - Normocephalic and atraumatic, dry mm ?LUNGS - Clear to auscultation bilaterally with no wheezes ?CV - S1S2 RRR, no m/r/g, equal pulses bilaterally. ?ABDOMEN - Soft, nontender, nondistended with normoactive BS ?Ext: warm, Left arm is contracted. well perfused, intact peripheral pulses, mild edema in left hand /arm ? ?NEURO:  ?Mental Status: AA&Ox1, baseline per wife. He is able to follow simple commands. ?Speech: clear ?Visual: no blink to threat on left  ?Pupils: equal and reactive ?EOM: gaze preference, can cross midline but not all the way  ?Corneal Reflex: not tested  ?Face: droop ?Cough/gag: not tested  ?Motor: left arm contracted with minimal movement, left leg no movement, Right arm and  leg 4/5 ?Tone: increased on left  ?Sensation- Intact to light touch bilaterally ?Coordination: FTN intact  ?Gait- deferred ? ? ?Imaging ?I have reviewed the images obtained: ? ?CT-head 4/18: ?-No acute intracranial abnormality. ?-Stable old high right parietal lobe infarct. ?-Stable cerebral atrophy and chronic small vessel disease ? ?Assessment:  ?Donald [name] is a 66 y.o. 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 She also noticed he had a right gaze preference during this episode. During these episodes he is able to talk through them, she has never noticed any incontinence,  ? ?Impression: ?Seizure-like activity-likely from prior ICH ? ?Recommendations: ?- Spot EEG followed by LTM for 24 hrs to evaluate for seizure activity  ?- Hold off on starting AED's at this time, if capture spell, would start Depakote 500mg  Q8hr ? ?Beulah Gandy DNP [id number] ? ? ? ?Attending addendum ?Patient seen and examined ?Imaging reviewed personally ?On CT head-no new bleed.  Sequela of prior ICH in the high right parietal lobe seen.  Stable cerebral atrophy and chronic small vessel disease ?On examination, dense left spastic hemiparesis  ?Agree with the plan above - spot EEG followed by LTM with the hope to capture an event.  Wife reluctant with Keppra due to side effects which has been tried in the past.  We will consider Depakote if there is strong suspicion for seizures and if EEG captures spells. ?Plan was d/w C. Prosperi, PA, EDP ?-- ?Amie Portland, MD ?Neurologist ?[facility] ?Pager: [phone] ?

## 2022-03-29 NOTE — ED Notes (Signed)
Patient's wife alerted me to seizure like activity. Pt noted to have jerking in bilateral legs- more pronounced in left left, and jerking in bilateral arms more pronounced in the left arm as well. Pt able to talk during jerking however eyes briefly rolled backwards and patient not responding to verbal stimuli any longer. SpO2 maintained and stable HR in the 70s-80s. Seizing activity lasting approximately 2 minutes.  ?

## 2022-03-29 NOTE — Progress Notes (Signed)
EEG complete - results pending 

## 2022-03-29 NOTE — H&P (Signed)
?History and Physical  ? ? ?Donald [name] [id number] DOB: 01-15-1956 DOA: 03/29/2022 ? ?Referring MD/NP/PA: EDP ?PCP:  ?Patient coming from: Home ? ?Chief Complaint: Seizure-like activity ? 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  Was brought to the ED on account of multiple episodes of leg jerking, twitching followed by eyes rolling backwards and facial, jaw twitching episodes which last for 2 to 3 minutes.  In the beginning of these episodes he was able to communicate with her.  She denies any fevers or chills, no nausea or vomiting. ?-He was treated for upper extremity spasticity with Botox in March. ?-In the ED he was obtunded, eventually woke up, was able to answer a few questions but confused, seen by neurology, placed on LTM, labs were unrevealing ? ?Review of Systems: As per HPI otherwise 14 point review of systems negative.  ? ?Past Medical History:  ?Diagnosis Date  ? Arthritis   ? Stroke [facility])   ? ? ?Past Surgical History:  ?Procedure Laterality Date  ? APPENDECTOMY    ? IR ANGIO INTRA EXTRACRAN SEL COM CAROTID INNOMINATE BILAT MOD SED  05/28/2021  ? IR ANGIO VERTEBRAL SEL VERTEBRAL BILAT MOD SED  05/28/2021  ? IR IVC FILTER PLMT / S&I /IMG GUID/MOD SED  05/27/2021  ? IR US GUIDE VASC ACCESS RIGHT  05/27/2021  ? ? ? reports that he has never smoked. He has never used smokeless tobacco. He reports that he does not drink alcohol and does not use drugs. ? ?Allergies  ?Allergen Reactions  ? Sulfa Antibiotics Other (See Comments)  ?  Nausea and eye swelling ?  ? ? ?Family History  ?Problem Relation Age of Onset  ? Hypertension Mother   ? Colon polyps Neg Hx   ? Colon cancer Neg Hx   ? Esophageal cancer Neg Hx   ? Rectal cancer Neg Hx   ? Stomach cancer Neg Hx   ? ? ? ?Prior to Admission medications   ?Medication Sig Start Date End Date Taking? Authorizing Provider   ?amLODipine (NORVASC) 2.5 MG tablet Take 1 tablet (2.5 mg total) by mouth daily. ?Patient not taking: Reported on 02/09/2022 10/11/21   [name], MD  ?Multiple Vitamin (MULTIVITAMIN) tablet Take 1 tablet by mouth daily.    [provider]  ? ? ?Physical Exam: ?Vitals:  ? 03/29/22 0800 03/29/22 0815 03/29/22 0830 03/29/22 1015  ?BP: 126/77 126/75 123/77 123/71  ?Pulse: 60 60 (!) 57 65  ?Resp: 14 13 15 13   ?Temp:      ?TempSrc:      ?SpO2: 100% 98% 100% 100%  ?Weight:      ?Height:      ? ? ?Chronically ill male appears much older than stated age, somnolent but arousable, oriented to self only, follows few simple commands ?HEENT: Pupils equal reactive, oral mucosa moist, facial droop ?CVS: S1-S2, regular rhythm ?Lungs: Poor air movement bilaterally ?Abdomen: Soft, nontender ?Extremities: Spastic left hemiplegia ?Neuro: Poor insight and judgment ?Psych: Flat affect ? ?Labs on Admission: I have personally reviewed following labs and imaging studies ? ?CBC: ?Recent Labs  ?Lab 03/27/22 ?1545 03/29/22 ?0849  ?WBC 3.8* 3.7*  ?NEUTROABS 1.2*  --   ?HGB 13.8 13.4  ?HCT 42.9 45.5  ?MCV 93.7 102.0*  ?PLT 254 102*  ? ?Basic Metabolic Panel: ?Recent Labs  ?Lab 03/27/22 ?1521 03/29/22 ?0849  ?NA 138 138  ?K  4.9 3.7  ?CL 104 110  ?CO2 27 21*  ?GLUCOSE 91 92  ?BUN 7* 11  ?CREATININE 1.03 1.12  ?CALCIUM 10.1 9.0  ?MG 2.1  --   ? ?GFR: ?Estimated Creatinine Clearance: 57.2 mL/min (by C-G formula based on SCr of 1.12 mg/dL). ?Liver Function Tests: ?Recent Labs  ?Lab 03/27/22 ?1521  ?AST 20  ?ALT 11  ?ALKPHOS 139*  ?BILITOT 0.6  ?PROT 7.0  ?ALBUMIN 4.2  ? ?No results for input(s): LIPASE, AMYLASE in the last 168 hours. ?No results for input(s): AMMONIA in the last 168 hours. ?Coagulation Profile: ?No results for input(s): INR, PROTIME in the last 168 hours. ?Cardiac Enzymes: ?No results for input(s): CKTOTAL, CKMB, CKMBINDEX, TROPONINI in the last 168 hours. ?BNP (last 3 results) ?No results for input(s): PROBNP in the  last 8760 hours. ?HbA1C: ?No results for input(s): HGBA1C in the last 72 hours. ?CBG: ?No results for input(s): GLUCAP in the last 168 hours. ?Lipid Profile: ?No results for input(s): CHOL, HDL, LDLCALC, TRIG, CHOLHDL, LDLDIRECT in the last 72 hours. ?Thyroid Function Tests: ?No results for input(s): TSH, T4TOTAL, FREET4, T3FREE, THYROIDAB in the last 72 hours. ?Anemia Panel: ?No results for input(s): VITAMINB12, FOLATE, FERRITIN, TIBC, IRON, RETICCTPCT in the last 72 hours. ?Urine analysis: ?   ?Component Value Date/Time  ? COLORURINE YELLOW 03/29/2022 1049  ? APPEARANCEUR HAZY (A) 03/29/2022 1049  ? APPEARANCEUR Clear 09/11/2020 1449  ? LABSPEC 1.024 03/29/2022 1049  ? PHURINE 6.0 03/29/2022 1049  ? GLUCOSEU NEGATIVE 03/29/2022 1049  ? HGBUR NEGATIVE 03/29/2022 1049  ? BILIRUBINUR NEGATIVE 03/29/2022 1049  ? BILIRUBINUR Negative 09/11/2020 1449  ? KETONESUR NEGATIVE 03/29/2022 1049  ? PROTEINUR NEGATIVE 03/29/2022 1049  ? UROBILINOGEN 0.2 04/15/2016 1108  ? NITRITE NEGATIVE 03/29/2022 1049  ? LEUKOCYTESUR NEGATIVE 03/29/2022 1049  ? ?Sepsis Labs: ?@LABRCNTIP (procalcitonin:4,lacticidven:4) ?)No results found for this or any previous visit (from the past 240 hour(s)).  ? ?Radiological Exams on Admission: ?CT Head Wo Contrast ? ?Result Date: 03/29/2022 ?CLINICAL DATA:  New onset seizure. EXAM: CT HEAD WITHOUT CONTRAST TECHNIQUE: Contiguous axial images were obtained from the base of the skull through the vertex without intravenous contrast. RADIATION DOSE REDUCTION: This exam was performed according to the departmental dose-optimization program which includes automated exposure control, adjustment of the mA and/or kV according to patient size and/or use of iterative reconstruction technique. COMPARISON:  03/27/2022 FINDINGS: Brain: No evidence of intracranial hemorrhage, acute infarction, acute hydrocephalus, extra-axial collection, or mass lesion/mass effect. An old high right parietal lobe infarct is again seen near  the convexity, with ex vacuo dilatation of the right lateral ventricle. 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 Mild cerebral atrophy without change. Vascular:  No hyperdense vessel or other acute findings. Skull: No evidence of fracture or other significant bone abnormality. Sinuses/Orbits:  No acute findings. Other: None. IMPRESSION: No acute intracranial abnormality. Stable old high right parietal lobe infarct. Stable cerebral atrophy and chronic small vessel disease. Electronically Signed   By: Marlaine Hind M.D.   On: 03/29/2022 09:28  ? ?CT Head Wo Contrast ? ?Result Date: 03/27/2022 ?CLINICAL DATA:  Seizure EXAM: CT HEAD WITHOUT CONTRAST TECHNIQUE: Contiguous axial images were obtained from the base of the skull through the vertex without intravenous contrast. RADIATION DOSE REDUCTION: This exam was performed according to the departmental dose-optimization program which includes automated exposure control, adjustment of the mA and/or kV according to patient size and/or use of iterative reconstruction technique. COMPARISON:  CT head 05/27/2021 FINDINGS: Brain:  No acute intracranial hemorrhage, mass effect, or herniation. No extra-axial fluid collections. No evidence of acute territorial infarct. No hydrocephalus. Mild cortical volume loss. Patchy hypodensities in the periventricular and subcortical white matter, likely secondary to chronic microvascular ischemic changes. Area of encephalomalacia in the high right frontoparietal region secondary to previous infarct. Vascular: No hyperdense vessel or unexpected calcification. Skull: Normal. Negative for fracture or focal lesion. Sinuses/Orbits: No acute finding. Chronic mild-to-moderate mucosal thickening in the maxillary sinuses left-greater-than-right. Other: None. IMPRESSION: Chronic changes as described with no acute intracranial process identified. Electronically Signed   By: Ofilia Neas M.D.   On: 03/27/2022 16:48    ? ?Assessment and plan ? ?Breakthrough seizures ?Encephalopathy ?-In the setting of prior ICH, CT head with no acute findings, sequelae of prior ICH and atrophy noted ?-Neurology consulting, placed on LTM, ?-Patient had 2

## 2022-03-29 NOTE — Progress Notes (Signed)
Started cEEG study.  Tested patient event button.  Educated Charity fundraiser and family on patient event button.  Per patient's wife, patient is very likely to pull leads off.  Wife and tech discussed with RN.  RN, Irving Burton, will reach out to MD regarding mittens/wrist restraints.  ?

## 2022-03-29 NOTE — Progress Notes (Signed)
Neurology Update: ? ?Dr. Rory Percy was notified by Dr. Hortense Ramal urgently for seizure activity on EEG. Multiple seizures identified on routine EEG and LTM. EEG read- one focal motor seizure arising from vertex region on 03/29/2022 at 1120 during which patient was noted to have left lower extremity jerking, lasting about 45 seconds. Two more seizures clinical signs were noted on 04/10/2022 at 1128 and 1134.  Lasting 18 seconds and 2 minutes respectively. We will load with Depakote 1500mg  IV followed by 500mg  IV Q8hr. Wife reports side effects with Keppra use in the past such as lethargy, behavioral disturbances, and paranoia. She is agreeable to starting Depakote. Will keep LTM on at least overnight to further evaluate for continuing seizure activity.  ? ?Beulah Gandy DNP, ACNPC-AG  ?

## 2022-03-29 NOTE — ED Triage Notes (Signed)
BIB [facility] after pt wife called to report pt having new onset seizures. Per EMS, pt seizure was full body and lasted 3-5 mins. Pt was seen at [facility] for leg spasms yesterday. Pt has appt. With neurology in May.  ? ?HX: Bed Bound. CVA with left sided deficits.  ?

## 2022-03-29 NOTE — ED Provider Notes (Signed)
?[facility] ?Provider Note ? ? ?CSN: [id number] ?Arrival date & time: 03/29/22  0546 ? ?  ? ?History ? ?Chief Complaint  ?Patient presents with  ? Seizures  ? ? ?Donald [name] is a 66 y.o. male with past medical history significant for prior hemorrhagic stroke, ongoing left-sided weakness who presents again to the emergency department for the second time in 2 days with concern of shaking episode.  Wife reports 2 episodes.  At around 8 PM last night patient had twitching activity of the left side of the face and jaw, as well as unilateral leg.  Later that morning around 4 AM he had more full body shaking, patient is able to call out during these episodes saying "hold my hand", but she describes eyes rolling back in head. Had previously [place], but wife describes that patient was out of it, anxious, and other side effects that she did not like.  He had a week of EEG back in 2022 which showed no evidence of seizure activity, antiepileptics discontinued at this time.  When patient was seen 2 days ago after average they discussed a loading dose of Keppra, starting oral Keppra, however decided to forego this plan secondary to previous side effects.  CBC, CMP, magnesium from 2 days ago are overall unremarkable.  Mild elevation of alkaline phosphatase. ? ?Wife, patient denies any dysuria, hematuria, fever, chills, nausea, vomiting, diarrhea.  We discussed whether or not they would want to start a new antiepileptic medication if 1 was suggested, he reports that unless he has some kind of positive EEG or other confirmation of findings they would prefer to stay off of medication.  Plan to follow-up with neurologist Dr. Leonie Man 04/14/22. ? ?Wife reports that he normally wears a condom catheter to help with decreasing amount of transportation to the bathroom, but does have urinary and fecal continence, can be transported to the toilet for bowel movements. ? ? ?Seizures ? ?  ? ?Home  Medications ?Prior to Admission medications   ?Medication Sig Start Date End Date Taking? Authorizing Provider  ?amLODipine (NORVASC) 2.5 MG tablet Take 1 tablet (2.5 mg total) by mouth daily. ?Patient not taking: Reported on 02/09/2022 10/11/21   [name], MD  ?Multiple Vitamin (MULTIVITAMIN) tablet Take 1 tablet by mouth daily.    [provider]  ?   ? ?Allergies    ?Sulfa antibiotics   ? ?Review of Systems   ?Review of Systems  ?Neurological:  Positive for seizures.  ?     Not clear whether true seizure activity -- thought to be possible muscle spasms  ?All other systems reviewed and are negative. ? ?Physical Exam ?Updated Vital Signs ?BP 123/71   Pulse 65   Temp (!) 97.3 ?F (36.3 ?C) (Temporal)   Resp 13   Ht 5\' 5"  (1.651 m)   Wt 68 kg   SpO2 100%   BMI 24.95 kg/m?  ?Physical Exam ?Vitals and nursing note reviewed.  ?Constitutional:   ?   General: He is not in acute distress. ?   Appearance: Normal appearance.  ?HENT:  ?   Head: Normocephalic and atraumatic.  ?Eyes:  ?   General:     ?   Right eye: No discharge.     ?   Left eye: No discharge.  ?Cardiovascular:  ?   Rate and Rhythm: Normal rate and regular rhythm.  ?   Heart sounds: No murmur heard. ?  No friction rub. No gallop.  ?  Pulmonary:  ?   Effort: Pulmonary effort is normal.  ?   Breath sounds: Normal breath sounds.  ?Abdominal:  ?   General: Bowel sounds are normal.  ?   Palpations: Abdomen is soft.  ?   Comments: No tenderness to palpation of the abdomen or suprapubically  ?Musculoskeletal:  ?   Comments: Patient with baseline left-sided deficits, left-sided neglect.  He is wheelchair-bound.  Patient with intact strength of the right extremities, starting to have some return of strength left lower extremity left upper extremity with notable decreased compared to the contralateral side.  ?Skin: ?   General: Skin is warm and dry.  ?   Capillary Refill: Capillary refill takes less than 2 seconds.  ?Neurological:  ?   Mental Status:  He is alert and oriented to person, place, and time.  ?Psychiatric:     ?   Mood and Affect: Mood normal.     ?   Behavior: Behavior normal.  ? ? ?ED Results / Procedures / Treatments   ?Labs ?(all labs ordered are listed, but only abnormal results are displayed) ?Labs Reviewed  ?CBC - Abnormal; Notable for the following components:  ?    Result Value  ? WBC 3.7 (*)   ? MCV 102.0 (*)   ? MCHC 29.5 (*)   ? Platelets 102 (*)   ? All other components within normal limits  ?BASIC METABOLIC PANEL - Abnormal; Notable for the following components:  ? CO2 21 (*)   ? All other components within normal limits  ?URINALYSIS, ROUTINE W REFLEX MICROSCOPIC  ? ? ?EKG ?EKG Interpretation ? ?Date/Time:  Tuesday March 29 2022 05:59:30 EDT ?Ventricular Rate:  63 ?PR Interval:  178 ?QRS Duration: 99 ?QT Interval:  388 ?QTC Calculation: 398 ?R Axis:   -10 ?Text Interpretation: Sinus rhythm Left anterior fascicular block No acute changes Confirmed by Addison Lank [phone]) on 03/29/2022 7:10:52 AM ? ?Radiology ?CT Head Wo Contrast ? ?Result Date: 03/29/2022 ?CLINICAL DATA:  New onset seizure. EXAM: CT HEAD WITHOUT CONTRAST TECHNIQUE: Contiguous axial images were obtained from the base of the skull through the vertex without intravenous contrast. RADIATION DOSE REDUCTION: This exam was performed according to the departmental dose-optimization program which includes automated exposure control, adjustment of the mA and/or kV according to patient size and/or use of iterative reconstruction technique. COMPARISON:  03/27/2022 FINDINGS: Brain: No evidence of intracranial hemorrhage, acute infarction, acute hydrocephalus, extra-axial collection, or mass lesion/mass effect. An old high right parietal lobe infarct is again seen near the convexity, with ex vacuo dilatation of the right lateral ventricle. 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 Mild cerebral atrophy without change. Vascular:  No hyperdense vessel or other acute  findings. Skull: No evidence of fracture or other significant bone abnormality. Sinuses/Orbits:  No acute findings. Other: None. IMPRESSION: No acute intracranial abnormality. Stable old high right parietal lobe infarct. Stable cerebral atrophy and chronic small vessel disease. Electronically Signed   By: Marlaine Hind M.D.   On: 03/29/2022 09:28  ? ?CT Head Wo Contrast ? ?Result Date: 03/27/2022 ?CLINICAL DATA:  Seizure EXAM: CT HEAD WITHOUT CONTRAST TECHNIQUE: Contiguous axial images were obtained from the base of the skull through the vertex without intravenous contrast. RADIATION DOSE REDUCTION: This exam was performed according to the departmental dose-optimization program which includes automated exposure control, adjustment of the mA and/or kV according to patient size and/or use of iterative reconstruction technique. COMPARISON:  CT head 05/27/2021 FINDINGS: Brain: No acute  intracranial hemorrhage, mass effect, or herniation. No extra-axial fluid collections. No evidence of acute territorial infarct. No hydrocephalus. Mild cortical volume loss. Patchy hypodensities in the periventricular and subcortical white matter, likely secondary to chronic microvascular ischemic changes. Area of encephalomalacia in the high right frontoparietal region secondary to previous infarct. Vascular: No hyperdense vessel or unexpected calcification. Skull: Normal. Negative for fracture or focal lesion. Sinuses/Orbits: No acute finding. Chronic mild-to-moderate mucosal thickening in the maxillary sinuses left-greater-than-right. Other: None. IMPRESSION: Chronic changes as described with no acute intracranial process identified. Electronically Signed   By: Ofilia Neas M.D.   On: 03/27/2022 16:48   ? ?Procedures ?Procedures  ? ? ?Medications Ordered in ED ?Medications - No data to display ? ?ED Course/ Medical Decision Making/ A&P ?Clinical Course as of 03/29/22 1106  ?Tue Mar 29, 2022  ?0728 1st EEG 30% -- by 3rd EEG 80%  sensitivity [CP]  ?[place] dysfunction on previous EEG expected from hemorrhagic stroke [CP]  ?[id number] Per Dr. Rory Percy ?1000 iv depakote ?500mg  bid ?-- ?Otherwise obs. admission [CP]  ?[place] Patient's wife opts for ob

## 2022-03-30 DIAGNOSIS — R569 Unspecified convulsions: Secondary | ICD-10-CM | POA: Diagnosis not present

## 2022-03-30 LAB — CBC
HCT: 42.3 % (ref 39.0–52.0)
Hemoglobin: 13.3 g/dL (ref 13.0–17.0)
MCH: 30.2 pg (ref 26.0–34.0)
MCHC: 31.4 g/dL (ref 30.0–36.0)
MCV: 96.1 fL (ref 80.0–100.0)
Platelets: 230 10*3/uL (ref 150–400)
RBC: 4.4 MIL/uL (ref 4.22–5.81)
RDW: 13.2 % (ref 11.5–15.5)
WBC: 8.2 10*3/uL (ref 4.0–10.5)
nRBC: 0 % (ref 0.0–0.2)

## 2022-03-30 LAB — COMPREHENSIVE METABOLIC PANEL
ALT: 17 U/L (ref 0–44)
AST: 26 U/L (ref 15–41)
Albumin: 3.9 g/dL (ref 3.5–5.0)
Alkaline Phosphatase: 147 U/L — ABNORMAL HIGH (ref 38–126)
Anion gap: 14 (ref 5–15)
BUN: 13 mg/dL (ref 8–23)
CO2: 18 mmol/L — ABNORMAL LOW (ref 22–32)
Calcium: 9.5 mg/dL (ref 8.9–10.3)
Chloride: 108 mmol/L (ref 98–111)
Creatinine, Ser: 1.2 mg/dL (ref 0.61–1.24)
GFR, Estimated: >60 mL/min (ref >60)
Glucose, Bld: 60 mg/dL — ABNORMAL LOW (ref 70–99)
Potassium: 4.1 mmol/L (ref 3.5–5.1)
Sodium: 140 mmol/L (ref 135–145)
Total Bilirubin: 0.9 mg/dL (ref 0.3–1.2)
Total Protein: 6.9 g/dL (ref 6.5–8.1)

## 2022-03-30 LAB — BASIC METABOLIC PANEL
Anion gap: 15 (ref 5–15)
BUN: 16 mg/dL (ref 8–23)
CO2: 19 mmol/L — ABNORMAL LOW (ref 22–32)
Calcium: 9.4 mg/dL (ref 8.9–10.3)
Chloride: 108 mmol/L (ref 98–111)
Creatinine, Ser: 1.11 mg/dL (ref 0.61–1.24)
GFR, Estimated: >60 mL/min (ref >60)
Glucose, Bld: 69 mg/dL — ABNORMAL LOW (ref 70–99)
Potassium: 4.5 mmol/L (ref 3.5–5.1)
Sodium: 142 mmol/L (ref 135–145)

## 2022-03-30 LAB — AMMONIA: Ammonia: 25 umol/L (ref 9–35)

## 2022-03-30 LAB — VALPROIC ACID LEVEL: Valproic Acid Lvl: 94 ug/mL (ref 50.0–100.0)

## 2022-03-30 MED ORDER — SODIUM CHLORIDE 0.9 % IV SOLN
100.0000 mg | Freq: Two times a day (BID) | INTRAVENOUS | Status: DC
  Administered 2022-03-30 – 2022-04-02 (×7): 100 mg via INTRAVENOUS
  Filled 2022-03-30 (×9): qty 10

## 2022-03-30 MED ORDER — SODIUM CHLORIDE 0.9 % IV SOLN
200.0000 mg | Freq: Once | INTRAVENOUS | Status: AC
  Administered 2022-03-30: 200 mg via INTRAVENOUS
  Filled 2022-03-30: qty 20

## 2022-03-30 MED ORDER — SODIUM CHLORIDE 0.9 % IV SOLN: INTRAVENOUS | Status: AC

## 2022-03-30 NOTE — Procedures (Addendum)
Patient Name: Donald [name]  ?MRN: [id number]  ?Epilepsy Attending: Charlsie Quest  ?Referring Physician/Provider: Mathews Argyle, NP ?Duration: 03/29/2022 1142 to 03/30/2022 1142 ?  ?Patient history: 66 y.o. male with past medical history of ICH with residual spastic hemiplegia and cognitive impairment, OSA, who presents to [facility] for frequent left leg jerking which lasts ~30 seconds - 1 minute and last pm had an episode of of left leg and face/jaw jerking and twitching which lasted for ~3-4 minutes. She also noticed he had a right gaze preference during this episode. During these episodes he is able to talk through them, she has never noticed any incontinence. EEG to evaluate for seizure.  ?  ?Level of alertness: Awake, asleep ?  ?AEDs during EEG study: VPA ?  ?Technical aspects: This EEG study was done with scalp electrodes positioned according to the 10-20 International system of electrode placement. Electrical activity was acquired at a sampling rate of 500Hz  and reviewed with a high frequency filter of 70Hz  and a low frequency filter of 1Hz . EEG data were recorded continuously and digitally stored.  ?  ?Description: The posterior dominant rhythm consists of 8-9 Hz activity of moderate voltage (25-35 uV) seen predominantly in posterior head regions, symmetric and reactive to eye opening and eye closing.  Sleep was characterized by vertex waves, sleep spindles (12 to 14 Hz), maximal frontocentral region.  EEG showed continuous 5 to 6 Hz theta slowing as well as intermittent rhythmic sharply contoured 2 to 3 Hz which appears quasiperiodic at 1 Hz in right fronto-parietal region.Hyperventilation and photic stimulation were not performed.    ?  ?Patient event button was pressed on 03/29/2022 at 1200, 1217 and on 03/30/2022 at 0233 during which patient was noted to have left lower extremity jerking followed by left upper extremity flexed and jerking as well as left neck deviation. Concomitant EEG initially showed  low amplitude 16 to 18 Hz beta activity in vertex region which then involved the right parieto-occipital region followed by left parieto-occipital region and also right hemisphere and evolved in frequency to 3 to 5 Hz sharply contoured theta-delta slowing.  This episode lasted for about 4.5 minutes each. ?  ?Seizures without clinical signs were also noted. At the beginning of study, EEG showed average 9 seizure/hour. As medications were adjusted, frequency of seizures improved to 2 seizure/hour, lasting about 45 seconds each. Concomitant EEG showed low amplitude 16 to 18 Hz beta activity in vertex region which then involved the right parieto-occipital region followed by left parieto-occipital region and also right hemisphere and evolved in frequency to 3 to 5 Hz sharply contoured theta-delta slowing. Last seizure was on 03/30/2022 at 1134. ? ?ABNORMALITY ?- Focal motor seizure, vertex region ?- Seizure without clinical sign, vertex region ?- Continuous slow, right fronto-parietal region. ?  ?IMPRESSION: ?This study showed three focal motor seizure arising from vertex region on 03/29/2022 at 1200, 1217 and on 03/20/2022 at  0233 during which patient was noted to have left lower extremity jerking, lasting about 4.5 minutes.  ? ?Seizures without clinical signs were also noted. At the beginning of study, EEG showed average 9 seizure/hour. As medications were adjusted, frequency of seizures improved to 2 seizure/hour, lasting about 45 seconds each. Last seizure was on 03/30/2022 at 1134. ? ?Additionally this study is suggestive of cortical dysfunction arising from right fronto-parietal region, likely secondary to underlying ICH.  ?  ?Dr 1218 was notified. ?  ?05/20/2022  ?

## 2022-03-30 NOTE — Progress Notes (Signed)
?  Transition of Care (TOC) Screening Note ? ? ?Patient Details  ?Name: Donald [name] ?Date of Birth: 1956/09/24 ? ? ?Transition of Care (TOC) CM/SW Contact:    ?Pollie Friar, RN ?Phone Number: ?03/30/2022, 1:55 PM ? ? ? ?Transition of Care Department [facility]) has reviewed patient and no TOC needs have been identified at this time. We will continue to monitor patient advancement through interdisciplinary progression rounds. If new patient transition needs arise, please place a TOC consult. ?  ?

## 2022-03-30 NOTE — Progress Notes (Signed)
LTM maint complete - no skin breakdown under:  FP1 FP2 F8 ?

## 2022-03-30 NOTE — Progress Notes (Signed)
?PROGRESS NOTE ? ? ? 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  Was brought to the ED on account of multiple episodes of leg jerking, twitching followed by eyes rolling backwards and facial, jaw twitching episodes which last for 2 to 3 minutes. ?-Admitted with breakthrough seizures, seen by neurology, LTM was positive, started on Depakote ? ? ?Subjective: ?-More awake today, wants to eat ? ?Assessment and Plan: ? ?Breakthrough seizures ?Encephalopathy ?-In the setting of prior ICH, CT head with no acute findings, sequelae of prior ICH and atrophy noted ?-Neurology consulting, EEG confirmed seizure activity ?-Started on IV Depakote yesterday ?-Mental status improving, will resume diet, cut down IV fluids ?-CT head and other labs unremarkable ?-Increase activity, PT eval ?  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 Pearlean BrownieSethi and physiatry Dr. Hermelinda MedicusSchwartz ?  ?Cognitive deficits, vascular Dementia (HCC) ?-Delirium precautions ?  ?DVT prophylaxis: lovenox ?Code Status: FUll Code, discussed with wife regarding resuscitation yesterday, she will discuss further with her kids ?Family Communication: Discussed with wife at bedside yesterday ?Disposition Plan: Home in 1 to 2 days if stable ? ?Consultants:  ?Neurology ? ?Procedures:  ? ?Antimicrobials:  ? ? ?Objective: ?Vitals:  ? 03/29/22 2250 03/30/22 0320 03/30/22 0730 03/30/22 0800  ?BP: 90/67 (!) 110/54 96/60   ?Pulse: 94 82 81   ?Resp: 18 16 16 16   ?Temp: 98.3 ?F (36.8 ?C) (!) 97.5 ?F (36.4 ?C) 97.9 ?F (36.6 ?C)   ?TempSrc:  Oral Oral   ?SpO2:  99% 100% 98%  ?Weight:      ?Height:      ? ? ?Intake/Output Summary (Last 24 hours) at 03/30/2022 1104 ?Last data filed at 03/30/2022 0328 ?Gross per  24 hour  ?Intake --  ?Output 350 ml  ?Net -350 ml  ? ?Filed Weights  ? 03/29/22 0618 03/29/22 2100  ?Weight: 68 kg 64.2 kg  ? ? ?Examination: ? ?Chronically ill male appears much older than stated age, more awake today, oriented to self only, follows simple commands ?HEENT: Pupils are reactive, facial droop noted ?CVS: S1-S2, regular rhythm ?Lungs: Poor air movement ?Abdomen: Soft, nontender ?Extremities: No edema, spastic left hemiplegia ?Neuro: Poor insight and judgment, left hemiplegia, spasticity noted ?Psych: Flat affect ? ? ? ? ?Data Reviewed:  ? ?CBC: ?Recent Labs  ?Lab 03/27/22 ?1545 03/29/22 ?[id number] 03/30/22 ?0708  ?WBC 3.8* 3.7* 8.2  ?NEUTROABS 1.2*  --   --   ?HGB 13.8 13.4 13.3  ?HCT 42.9 45.5 42.3  ?MCV 93.7 102.0* 96.1  ?PLT 254 102* 230  ? ?Basic Metabolic Panel: ?Recent Labs  ?Lab 03/27/22 ?1521 03/29/22 ?[id number] 03/30/22 ?0335  ?NA 138 138 140  ?K 4.9 3.7 4.1  ?CL 104 110 108  ?CO2 27 21* 18*  ?GLUCOSE 91 92 60*  ?BUN 7* 11 13  ?CREATININE 1.03 1.12 1.20  ?CALCIUM 10.1 9.0 9.5  ?MG 2.1  --   --   ? ?GFR: ?Estimated Creatinine Clearance: 53.4 mL/min (by C-G formula based on SCr of 1.2 mg/dL). ?Liver Function Tests: ?Recent Labs  ?Lab 03/27/22 ?1521 03/30/22 ?0335  ?AST 20 26  ?ALT 11 17  ?ALKPHOS 139* 147*  ?BILITOT 0.6 0.9  ?PROT 7.0 6.9  ?ALBUMIN 4.2 3.9  ? ?No results for input(s): LIPASE, AMYLASE in the last 168  hours. ?Recent Labs  ?Lab 03/30/22 ?0915  ?AMMONIA 25  ? ?Coagulation Profile: ?No results for input(s): INR, PROTIME in the last 168 hours. ?Cardiac Enzymes: ?No results for input(s): CKTOTAL, CKMB, CKMBINDEX, TROPONINI in the last 168 hours. ?BNP (last 3 results) ?No results for input(s): PROBNP in the last 8760 hours. ?HbA1C: ?No results for input(s): HGBA1C in the last 72 hours. ?CBG: ?No results for input(s): GLUCAP in the last 168 hours. ?Lipid Profile: ?No results for input(s): CHOL, HDL, LDLCALC, TRIG, CHOLHDL, LDLDIRECT in the last 72 hours. ?Thyroid Function Tests: ?No results for  input(s): TSH, T4TOTAL, FREET4, T3FREE, THYROIDAB in the last 72 hours. ?Anemia Panel: ?No results for input(s): VITAMINB12, FOLATE, FERRITIN, TIBC, IRON, RETICCTPCT in the last 72 hours. ?Urine analysis: ?   ?Component Value Date/Time  ? COLORURINE YELLOW 03/29/2022 1049  ? APPEARANCEUR HAZY (A) 03/29/2022 1049  ? APPEARANCEUR Clear 09/11/2020 1449  ? LABSPEC 1.024 03/29/2022 1049  ? PHURINE 6.0 03/29/2022 1049  ? GLUCOSEU NEGATIVE 03/29/2022 1049  ? HGBUR NEGATIVE 03/29/2022 1049  ? BILIRUBINUR NEGATIVE 03/29/2022 1049  ? BILIRUBINUR Negative 09/11/2020 1449  ? KETONESUR NEGATIVE 03/29/2022 1049  ? PROTEINUR NEGATIVE 03/29/2022 1049  ? UROBILINOGEN 0.2 04/15/2016 1108  ? NITRITE NEGATIVE 03/29/2022 1049  ? LEUKOCYTESUR NEGATIVE 03/29/2022 1049  ? ?Sepsis Labs: ?@[email or web address] (procalcitonin:4,lacticidven:4) ? ?)No results found for this or any previous visit (from the past 240 hour(s)).  ? ?Radiology Studies: ?CT Head Wo Contrast ? ?Result Date: 03/29/2022 ?CLINICAL DATA:  New onset seizure. EXAM: CT HEAD WITHOUT CONTRAST TECHNIQUE: Contiguous axial images were obtained from the base of the skull through the vertex without intravenous contrast. RADIATION DOSE REDUCTION: This exam was performed according to the departmental dose-optimization program which includes automated exposure control, adjustment of the mA and/or kV according to patient size and/or use of iterative reconstruction technique. COMPARISON:  03/27/2022 FINDINGS: Brain: No evidence of intracranial hemorrhage, acute infarction, acute hydrocephalus, extra-axial collection, or mass lesion/mass effect. An old high right parietal lobe infarct is again seen near the convexity, with ex vacuo dilatation of the right lateral ventricle. 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 Mild cerebral atrophy without change. Vascular:  No hyperdense vessel or other acute findings. Skull: No evidence of fracture or other significant bone abnormality.  Sinuses/Orbits:  No acute findings. Other: None. IMPRESSION: No acute intracranial abnormality. Stable old high right parietal lobe infarct. Stable cerebral atrophy and chronic small vessel disease. Electronically Signed   By: 03/29/2022 M.D.   On: 03/29/2022 09:28  ? ?EEG adult ? ?Result Date: 03/29/2022 ?03/31/2022, MD     03/29/2022  1:03 PM Patient Name: Machael Raine MRN: Donald Left Epilepsy Attending: [id number] Referring Physician/Provider: Charlsie Quest, NP Date: 03/29/2022 Duration: 22.21 mins Patient history: 66 y.o. male with past medical history of ICH with residual spastic hemiplegia and cognitive impairment, OSA, who presents to [facility] for frequent left leg jerking which lasts ~30 seconds - 1 minute and last pm had an episode of of left leg and face/jaw jerking and twitching which lasted for ~3-4 minutes. She also noticed he had a right gaze preference during this episode. During these episodes he is able to talk through them, she has never noticed any incontinence. EEG to evaluate for seizure. Level of alertness: Awake, asleep AEDs during EEG study: None Technical aspects: This EEG study was done with scalp electrodes positioned according to the 10-20 International system of electrode placement. Electrical activity was acquired at  a sampling rate of 500Hz  and reviewed with a high frequency filter of 70Hz  and a low frequency filter of 1Hz . EEG data were recorded continuously and digitally stored. Description: The posterior dominant rhythm consists of 8-9 Hz activity of moderate voltage (25-35 uV) seen predominantly in posterior head regions, symmetric and reactive to eye opening and eye closing.  Sleep was characterized by vertex waves, sleep spindles (12 to 14 Hz), maximal frontocentral region.  EEG showed continuous 5 to 6 Hz theta slowing as well as intermittent rhythmic sharply contoured 2 to 3 Hz which appears quasiperiodic at 1 Hz in right fronto-parietal region.Hyperventilation  and photic stimulation were not performed.   Patient event button was pressed on 03/29/2022 at 1128 during which patient was noted to have left lower extremity jerking.  Concomitant EEG initially showed low a

## 2022-03-30 NOTE — Progress Notes (Addendum)
Neurology Progress Note ? ? ?S:// ?No acute issues overnight.  Overnight EEG report pending ? ? ?O:// ?Current vital signs: ?BP 96/60 (BP Location: Right Arm)   Pulse 81   Temp 97.9 ?F (36.6 ?C) (Oral)   Resp 16   Ht 5\' 5"  (1.651 m)   Wt 64.2 kg   SpO2 98%   BMI 23.55 kg/m?  ?Vital signs in last 24 hours: ?Temp:  [97.5 ?F (36.4 ?C)-98.3 ?F (36.8 ?C)] 97.9 ?F (36.6 ?C) (04/19 0730) ?Pulse Rate:  [57-94] 81 (04/19 0730) ?Resp:  [12-18] 16 (04/19 0800) ?BP: (90-146)/(54-82) 96/60 (04/19 0730) ?SpO2:  [91 %-100 %] 98 % (04/19 0800) ?Weight:  [64.2 kg] 64.2 kg (04/18 2100) ?General: Comfortably sleeping in bed in no distress ?HEENT: Normocephalic atraumatic ?Lungs: Clear ?Cardiovascular: Regular rate rhythm ?Abdomen soft nondistended nontender ?Extremities, warm, left arm and leg with increased tone and contractures and edema ?Neurological exam ?Awake alert oriented x1 which is baseline per wife ?Follows simple commands ?Speech is clear ?Cranial nerves: Pupils are equal round reactive light, extraocular movements intact, gaze preference to the right, can look towards the left but does not go all the way today into the canthus, face mild left facial droop. ?Motor examination with left spastic hemiplegia, right side with 4+/5 strength. ?Sensation intact ?Coordination intact on the right ?Gait deferred ? ?Medications ? ?Current Facility-Administered Medications:  ?  0.9 %  sodium chloride infusion, , Intravenous, Continuous, Domenic Polite, MD, Last Rate: 75 mL/hr at 03/30/22 0505, New Bag at 03/30/22 0505 ?  acetaminophen (TYLENOL) tablet 650 mg, 650 mg, Oral, Q6H PRN **OR** acetaminophen (TYLENOL) suppository 650 mg, 650 mg, Rectal, Q6H PRN, Domenic Polite, MD ?  multivitamin with minerals tablet 1 tablet, 1 tablet, Oral, Daily, Domenic Polite, MD ?  ondansetron [facility]) tablet 4 mg, 4 mg, Oral, Q6H PRN **OR** ondansetron (ZOFRAN) injection 4 mg, 4 mg, Intravenous, Q6H PRN, Domenic Polite, MD ?  senna-docusate  (Senokot-S) tablet 1 tablet, 1 tablet, Oral, QHS PRN, Domenic Polite, MD ?  valproate (DEPACON) 500 mg in dextrose 5 % 50 mL IVPB, 500 mg, Intravenous, Q8H, Beulah Gandy A, NP, Last Rate: 55 mL/hr at 03/30/22 0807, 500 mg at 03/30/22 0807 ?Labs ?CBC ?   ?Component Value Date/Time  ? WBC 8.2 03/30/2022 0708  ? RBC 4.40 03/30/2022 0708  ? HGB 13.3 03/30/2022 0708  ? HGB 16.1 09/11/2020 1449  ? HCT 42.3 03/30/2022 0708  ? HCT 48.6 09/11/2020 1449  ? PLT 230 03/30/2022 0708  ? PLT 234 04/03/2017 0923  ? MCV 96.1 03/30/2022 0708  ? MCV 91 09/11/2020 1449  ? MCH 30.2 03/30/2022 0708  ? MCHC 31.4 03/30/2022 0708  ? RDW 13.2 03/30/2022 0708  ? RDW 13.1 09/11/2020 1449  ? LYMPHSABS 2.2 03/27/2022 1545  ? LYMPHSABS 2.1 09/11/2020 1449  ? MONOABS 0.3 03/27/2022 1545  ? EOSABS 0.1 03/27/2022 1545  ? EOSABS 0.0 09/11/2020 1449  ? BASOSABS 0.0 03/27/2022 1545  ? BASOSABS 0.0 09/11/2020 1449  ? ? ?CMP  ?   ?Component Value Date/Time  ? NA 140 03/30/2022 0335  ? NA 143 09/11/2020 1449  ? K 4.1 03/30/2022 0335  ? CL 108 03/30/2022 0335  ? CO2 18 (L) 03/30/2022 0335  ? GLUCOSE 60 (L) 03/30/2022 0335  ? BUN 13 03/30/2022 0335  ? BUN 10 09/11/2020 1449  ? CREATININE 1.20 03/30/2022 0335  ? CREATININE 1.37 (H) 04/29/2020 [id number]  ? CALCIUM 9.5 03/30/2022 0335  ? PROT 6.9 03/30/2022 0335  ?  PROT 7.6 09/11/2020 1449  ? ALBUMIN 3.9 03/30/2022 0335  ? ALBUMIN 4.8 09/11/2020 1449  ? AST 26 03/30/2022 0335  ? ALT 17 03/30/2022 0335  ? ALKPHOS 147 (H) 03/30/2022 0335  ? BILITOT 0.9 03/30/2022 0335  ? BILITOT 0.5 09/11/2020 1449  ? GFRNONAA >60 03/30/2022 0335  ? GFRNONAA 55 (L) 04/29/2020 0826  ? GFRAA 76 09/11/2020 1449  ? GFRAA 63 04/29/2020 0826  ? ? ?Imaging ?I have reviewed images in epic and the results pertinent to this consultation are: ?CT head 418-no acute abnormality. ? ?Assessment: 66 year old man past history of ICH with residual left spastic hemiplegia and cognitive impairment, sleep apnea, presented to the emergency room for  multiple episodes concerning for seizures.  Witnessed episode by primary team with focal onset with secondary generalization along with rightward gaze preference.  EEG confirmed the presence of seizures. ?Loaded with Depakote yesterday. ?On continuous EEG monitoring pending overnight read. ? 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 ? ?Recommendations: ?-Continue valproate 500 twice daily ?-Check valproate level, ammonia level ?-If the EEG overnight is negative for seizures, can be discontinued.  If it shows seizures, will continue for a while till he is seizure-free for at least 24 hours. ?-Maintain seizure precautions ? ?Will follow labs and EEG results and update. ? ?Plan relayed to Dr. Broadus John ? ?-- ?Amie Portland, MD ?Neurologist ?[facility] ?Pager: [phone] ? ? ?Addendum ?Continuous EEG with focal motor seizures-multiple during the night as well as subclinical seizures. ?Loading with Vimpat ?Adding Vimpat 100 twice daily to the Depakote. ?Continue LTM ?We will continue to follow.  Plan relayed to Dr. Broadus John ? ?-- ?Amie Portland, MD ?Neurologist ?[facility] ?Pager: [phone] ? ?

## 2022-03-31 DIAGNOSIS — R569 Unspecified convulsions: Secondary | ICD-10-CM | POA: Diagnosis not present

## 2022-03-31 NOTE — Procedures (Addendum)
Patient Name: Donald [name]  ?MRN: [id number]  ?Epilepsy Attending: Charlsie Quest  ?Referring Physician/Provider: Mathews Argyle, NP ?Duration: 03/30/2022 1142 to 03/31/2022 0931 ?  ?Patient history: 66 y.o. male with past medical history of ICH with residual spastic hemiplegia and cognitive impairment, OSA, who presents to [facility] for frequent left leg jerking which lasts ~30 seconds - 1 minute and last pm had an episode of of left leg and face/jaw jerking and twitching which lasted for ~3-4 minutes. She also noticed he had a right gaze preference during this episode. During these episodes he is able to talk through them, she has never noticed any incontinence. EEG to evaluate for seizure.  ?  ?Level of alertness: Awake, asleep ?  ?AEDs during EEG study: VPA, LCM ?  ?Technical aspects: This EEG study was done with scalp electrodes positioned according to the 10-20 International system of electrode placement. Electrical activity was acquired at a sampling rate of 500Hz  and reviewed with a high frequency filter of 70Hz  and a low frequency filter of 1Hz . EEG data were recorded continuously and digitally stored.  ?  ?Description: The posterior dominant rhythm consists of 8-9 Hz activity of moderate voltage (25-35 uV) seen predominantly in posterior head regions, symmetric and reactive to eye opening and eye closing.  Sleep was characterized by vertex waves, sleep spindles (12 to 14 Hz), maximal frontocentral region.  EEG showed continuous 3 to 6 Hz theta-delta slowing in right fronto-parietal region.Hyperventilation and photic stimulation were not performed.  ? ?Of note, there was significant electrode artifact after 3 AM on 03/31/2022 and therefore the study was difficult to interpret. ?   ?ABNORMALITY ?- Continuous slow, right fronto-parietal region. ?  ?IMPRESSION: ?This study is suggestive of cortical dysfunction arising from right fronto-parietal region, likely secondary to underlying ICH.  No definite seizures  were seen during the study. ? ?EEG appears to be improved compared to previous day. ?  ?  ?

## 2022-03-31 NOTE — Progress Notes (Signed)
EEG D/C. Patient would only let me touch his head for a certain amount of time so it was cleaned as best as possible. No skin breakdown and [facility] was notified.  ?

## 2022-03-31 NOTE — Progress Notes (Signed)
?PROGRESS NOTE ? ? ? 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  Was brought to the ED on account of multiple episodes of leg jerking, twitching followed by eyes rolling backwards and facial, jaw twitching episodes which last for 2 to 3 minutes. ?-Admitted with breakthrough seizures, seen by neurology, LTM was positive, started on Depakote ?-LTM noted additional seizures, was started on Vimpat 4/19 ? ?Subjective: ?-Feels better today, no events overnight, more responsive/interactive today ? ?Assessment and Plan: ? ?Breakthrough seizures ?Encephalopathy ?-In the setting of prior ICH, CT head with no acute findings, sequelae of prior ICH and atrophy noted ?-Neurology consulting, EEG confirmed seizure activity ?-Started on IV Depakote 4/18,  continued to have focal motor seizures and subclinical seizures after this, started on IV Vimpat 4/19 ?-Awaiting LTM read ?-CT head and other labs unremarkable ?-Increase activity, PT eval ?  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 Pearlean Brownie and physiatry Dr. Hermelinda Medicus ?  ?Cognitive deficits, vascular Dementia (HCC) ?-Delirium precautions ?  ?DVT prophylaxis: lovenox ?Code Status: FUll Code, discussed with wife again today, recommended consideration of DNR ?Family Communication: No family at bedside, called and updated wife ?Disposition Plan: Home in 1 to 2 days if stable ? ?Consultants:  ?Neurology ? ?Procedures: LTM ? ?Antimicrobials:  ? ? ?Objective: ?Vitals:  ? 03/30/22 1922 03/30/22 2325 03/31/22 0310 03/31/22 0800  ?BP: (!) 99/52 121/68 104/61 120/65  ?Pulse: 74 65 (!) 50 (!) 52  ?Resp: 16 16 16 16   ?Temp: 98.6 ?F (37 ?C) 97.6 ?F (36.4 ?C) 97.7 ?F (36.5 ?C) 97.8 ?F (36.6 ?C)  ?TempSrc: Oral  Oral Oral Oral  ?SpO2: 100% 100% 100% 100%  ?Weight:      ?Height:      ? ? ?Intake/Output Summary (Last 24 hours) at 03/31/2022 1041 ?Last data filed at 03/31/2022 0244 ?Gross per 24 hour  ?Intake 120 ml  ?Output 600 ml  ?Net -480 ml  ? ?Filed Weights  ? 03/29/22 0618 03/29/22 2100  ?Weight: 68 kg 64.2 kg  ? ? ?Examination: ? ?Chronically ill male appears much older than stated age, more awake today, oriented to self only, follows simple commands ?HEENT: Pupils are reactive, facial droop noted ?CVS: S1-S2, regular rhythm ?Lungs: Poor air movement ?Abdomen: Soft, nontender ?Extremities: No edema, spastic left hemiplegia ?Neuro: Poor insight and judgment, left hemiplegia, spasticity noted ?Psych: Flat affect ? ? ? ? ?Data Reviewed:  ? ?CBC: ?Recent Labs  ?Lab 03/27/22 ?1545 03/29/22 ?03/31/22 03/30/22 ?0708  ?WBC 3.8* 3.7* 8.2  ?NEUTROABS 1.2*  --   --   ?HGB 13.8 13.4 13.3  ?HCT 42.9 45.5 42.3  ?MCV 93.7 102.0* 96.1  ?PLT 254 102* 230  ? ?Basic Metabolic Panel: ?Recent Labs  ?Lab 03/27/22 ?1521 03/29/22 ?03/31/22 03/30/22 ?0335 03/30/22 ?0915  ?NA 138 138 140 142  ?K 4.9 3.7 4.1 4.5  ?CL 104 110 108 108  ?CO2 27 21* 18* 19*  ?GLUCOSE 91 92 60* 69*  ?BUN 7* 11 13 16   ?CREATININE 1.03 1.12 1.20 1.11  ?CALCIUM 10.1 9.0 9.5 9.4  ?MG 2.1  --   --   --   ? ?GFR: ?Estimated Creatinine Clearance: 57.7 mL/min (by C-G formula based on SCr of 1.11 mg/dL). ?Liver Function Tests: ?Recent Labs  ?Lab 03/27/22 ?1521 03/30/22 ?  0335  ?AST 20 26  ?ALT 11 17  ?ALKPHOS 139* 147*  ?BILITOT 0.6 0.9  ?PROT 7.0 6.9  ?ALBUMIN 4.2 3.9  ? ?No results for input(s): LIPASE, AMYLASE in the last 168 hours. ?Recent Labs  ?Lab 03/30/22 ?0915  ?AMMONIA 25  ? ?Coagulation Profile: ?No results for input(s): INR, PROTIME in the last 168 hours. ?Cardiac Enzymes: ?No results for input(s): CKTOTAL, CKMB, CKMBINDEX, TROPONINI in the last 168 hours. ?BNP (last 3 results) ?No results for input(s): PROBNP in the last 8760 hours. ?HbA1C: ?No results for input(s): HGBA1C  in the last 72 hours. ?CBG: ?No results for input(s): GLUCAP in the last 168 hours. ?Lipid Profile: ?No results for input(s): CHOL, HDL, LDLCALC, TRIG, CHOLHDL, LDLDIRECT in the last 72 hours. ?Thyroid Function Tests: ?No results for input(s): TSH, T4TOTAL, FREET4, T3FREE, THYROIDAB in the last 72 hours. ?Anemia Panel: ?No results for input(s): VITAMINB12, FOLATE, FERRITIN, TIBC, IRON, RETICCTPCT in the last 72 hours. ?Urine analysis: ?   ?Component Value Date/Time  ? COLORURINE YELLOW 03/29/2022 1049  ? APPEARANCEUR HAZY (A) 03/29/2022 1049  ? APPEARANCEUR Clear 09/11/2020 1449  ? LABSPEC 1.024 03/29/2022 1049  ? PHURINE 6.0 03/29/2022 1049  ? GLUCOSEU NEGATIVE 03/29/2022 1049  ? HGBUR NEGATIVE 03/29/2022 1049  ? BILIRUBINUR NEGATIVE 03/29/2022 1049  ? BILIRUBINUR Negative 09/11/2020 1449  ? KETONESUR NEGATIVE 03/29/2022 1049  ? PROTEINUR NEGATIVE 03/29/2022 1049  ? UROBILINOGEN 0.2 04/15/2016 1108  ? NITRITE NEGATIVE 03/29/2022 1049  ? LEUKOCYTESUR NEGATIVE 03/29/2022 1049  ? ?Sepsis Labs: ?@[email or web address] (procalcitonin:4,lacticidven:4) ? ?)No results found for this or any previous visit (from the past 240 hour(s)).  ? ?Radiology Studies: ?EEG adult ? ?Result Date: 03/29/2022 ?Charlsie QuestYadav, Priyanka O, MD     03/29/2022  1:03 PM Patient Name: Melanee Leftmmanuel Anthis MRN: [id number] Epilepsy Attending: Charlsie QuestPriyanka O Yadav Referring Physician/Provider: Mathews ArgyleWolfe, Denise A, NP Date: 03/29/2022 Duration: 22.21 mins Patient history: 66 y.o. male with past medical history of ICH with residual spastic hemiplegia and cognitive impairment, OSA, who [facility] for frequent left leg jerking which lasts ~30 seconds - 1 minute and last pm had an episode of of left leg and face/jaw jerking and twitching which lasted for ~3-4 minutes. She also noticed he had a right gaze preference during this episode. During these episodes he is able to talk through them, she has never noticed any incontinence. EEG to evaluate for seizure. Level of alertness: Awake,  asleep AEDs during EEG study: None Technical aspects: This EEG study was done with scalp electrodes positioned according to the 10-20 International system of electrode placement. Electrical activity was acquired at a sampling rate of 500Hz  and reviewed with a high frequency filter of 70Hz  and a low frequency filter of 1Hz . EEG data were recorded continuously and digitally stored. Description: The posterior dominant rhythm consists of 8-9 Hz activity of moderate voltage (25-35 uV) seen predominantly in posterior head regions, symmetric and reactive to eye opening and eye closing.  Sleep was characterized by vertex waves, sleep spindles (12 to 14 Hz), maximal frontocentral region.  EEG showed continuous 5 to 6 Hz theta slowing as well as intermittent rhythmic sharply contoured 2 to 3 Hz which appears quasiperiodic at 1 Hz in right fronto-parietal region.Hyperventilation and photic stimulation were not performed.   Patient event button was pressed on 03/29/2022 at 1128 during which patient was noted to have left lower extremity jerking.  Concomitant EEG initially showed low amplitude 16 to 18 Hz beta activity in vertex region which then involved  the right parieto-occipital region followed by left parieto-occipital region and also right hemisphere and evolved in frequency to 3 to 5 Hz sharply contoured theta-delta slowing.  This episode lasted for about 45 seconds Two more seizures without clinical signs were noted on 03/29/2022 at 1128 and 1134, lasting 18 seconds and 2-minute respectively. Concomitant EEG showed low amplitude 16 to 18 Hz beta activity in vertex region which then involved the right parieto-occipital region followed by left parieto-occipital region and also right hemisphere and evolved in frequency to 3 to 5 Hz sharply contoured theta-delta slowing. ABNORMALITY - Focal motor seizure, vertex region -Seizure without clinical sign, vertex region - Continuous slow, right fronto-parietal region. IMPRESSION:  This study showed one focal motor seizure arising from vertex region on 03/29/2022 at 1120 during which patient was noted to have left lower extremity jerking, lasting about 45 seconds. Two more seizures

## 2022-03-31 NOTE — Progress Notes (Signed)
Neurology Progress Note ? ? ?S:// ?No acute issues overnight.  Overnight EEG report pending ?Vimpat added yesterday due to multiplicity of seizures on the prior night's EEG. ? ? ? ?O:// ?Current vital signs: ?BP 120/65 (BP Location: Right Arm)   Pulse (!) 52   Temp 97.8 ?F (36.6 ?C) (Oral)   Resp 16   Ht 5\' 5"  (1.651 m)   Wt 64.2 kg   SpO2 100%   BMI 23.55 kg/m?  ?Vital signs in last 24 hours: ?Temp:  [97.6 ?F (36.4 ?C)-98.6 ?F (37 ?C)] 97.8 ?F (36.6 ?C) (04/20 0800) ?Pulse Rate:  [50-91] 52 (04/20 0800) ?Resp:  [14-17] 16 (04/20 0800) ?BP: (99-121)/(20-68) 120/65 (04/20 0800) ?SpO2:  [99 %-100 %] 100 % (04/20 0800) ?General: Comfortably sleeping in bed in no distress ?HEENT: Normocephalic atraumatic ?Lungs: Clear ?Cardiovascular: Regular rate rhythm ?Abdomen soft nondistended nontender ?Extremities, warm, left arm and leg with increased tone and contractures and edema ?Neurological exam ?Awake alert oriented x1 which is baseline per wife ?Follows simple commands ?Speech is clear ?Cranial nerves: Pupils are equal round reactive light, extraocular movements intact, gaze preference to the right, can look towards the left but does not go all the way today into the canthus, face mild left facial droop. ?Motor examination with left spastic hemiplegia, right side with 4+/5 strength. ?Sensation intact ?Coordination intact on the right ?Gait deferred ?Unchanged exam ? ?Medications ? ?Current Facility-Administered Medications:  ?  acetaminophen (TYLENOL) tablet 650 mg, 650 mg, Oral, Q6H PRN **OR** acetaminophen (TYLENOL) suppository 650 mg, 650 mg, Rectal, Q6H PRN, 05-12-2005, MD ?  lacosamide (VIMPAT) 100 mg in sodium chloride 0.9 % 25 mL IVPB, 100 mg, Intravenous, Q12H, Zannie Cove, DO, Last Rate: 70 mL/hr at 03/30/22 2210, 100 mg at 03/30/22 2210 ?  multivitamin with minerals tablet 1 tablet, 1 tablet, Oral, Daily, 2211, MD, 1 tablet at 03/30/22 1044 ?  ondansetron (ZOFRAN) tablet 4 mg, 4 mg,  Oral, Q6H PRN **OR** ondansetron (ZOFRAN) injection 4 mg, 4 mg, Intravenous, Q6H PRN, 04/01/22, MD ?  senna-docusate (Senokot-S) tablet 1 tablet, 1 tablet, Oral, QHS PRN, Zannie Cove, MD ?  valproate (DEPACON) 500 mg in dextrose 5 % 50 mL IVPB, 500 mg, Intravenous, Q8H, Zannie Cove A, NP, Last Rate: 55 mL/hr at 03/31/22 0532, 500 mg at 03/31/22 0532 ?Labs ?CBC ?   ?Component Value Date/Time  ? WBC 8.2 03/30/2022 0708  ? RBC 4.40 03/30/2022 0708  ? HGB 13.3 03/30/2022 0708  ? HGB 16.1 09/11/2020 1449  ? HCT 42.3 03/30/2022 0708  ? HCT 48.6 09/11/2020 1449  ? PLT 230 03/30/2022 0708  ? PLT 234 04/03/2017 0923  ? MCV 96.1 03/30/2022 0708  ? MCV 91 09/11/2020 1449  ? MCH 30.2 03/30/2022 0708  ? MCHC 31.4 03/30/2022 0708  ? RDW 13.2 03/30/2022 0708  ? RDW 13.1 09/11/2020 1449  ? LYMPHSABS 2.2 03/27/2022 1545  ? LYMPHSABS 2.1 09/11/2020 1449  ? MONOABS 0.3 03/27/2022 1545  ? EOSABS 0.1 03/27/2022 1545  ? EOSABS 0.0 09/11/2020 1449  ? BASOSABS 0.0 03/27/2022 1545  ? BASOSABS 0.0 09/11/2020 1449  ? ? ?CMP  ?   ?Component Value Date/Time  ? NA 142 03/30/2022 0915  ? NA 143 09/11/2020 1449  ? K 4.5 03/30/2022 0915  ? CL 108 03/30/2022 0915  ? CO2 19 (L) 03/30/2022 0915  ? GLUCOSE 69 (L) 03/30/2022 0915  ? BUN 16 03/30/2022 0915  ? BUN 10 09/11/2020 1449  ? CREATININE 1.11 03/30/2022 0915  ?  CREATININE 1.37 (H) 04/29/2020 0093  ? CALCIUM 9.4 03/30/2022 0915  ? PROT 6.9 03/30/2022 0335  ? PROT 7.6 09/11/2020 1449  ? ALBUMIN 3.9 03/30/2022 0335  ? ALBUMIN 4.8 09/11/2020 1449  ? AST 26 03/30/2022 0335  ? ALT 17 03/30/2022 0335  ? ALKPHOS 147 (H) 03/30/2022 0335  ? BILITOT 0.9 03/30/2022 0335  ? BILITOT 0.5 09/11/2020 1449  ? GFRNONAA >60 03/30/2022 0915  ? GFRNONAA 55 (L) 04/29/2020 0826  ? GFRAA 76 09/11/2020 1449  ? GFRAA 63 04/29/2020 0826  ? ?Valproate level 94 ?Ammonia 25 ? ?Imaging ?I have reviewed images in epic and the results pertinent to this consultation are: ?CT head 418-no acute  abnormality. ? ?Assessment: 66 year old man past history of ICH with residual left spastic hemiplegia and cognitive impairment, sleep apnea, presented to the emergency room for multiple episodes concerning for seizures.  Witnessed episode by primary team with focal onset with secondary generalization along with rightward gaze preference.  EEG confirmed the presence of seizures. ?Loaded with Depakote-EEG continued to show seizure activity.  Vimpat added 03/30/22 ?Overnight EEG read pending. ?Exam remained stable ? 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 ? ?Recommendations: ?-Continue valproate 500 twice daily ?-Continue Vimpat 100 twice daily ?-We will continue LTM until he is seizure-free for at least 24 hours. ?-Maintain seizure precautions ? ?Await overnight EEG results-further updates after EEG is resulted. ? ?Plan relayed to Dr. Jomarie Longs ? ?-- ?Milon Dikes, MD ?Neurologist ?[facility] ?Pager: [phone] ?

## 2022-04-01 DIAGNOSIS — R569 Unspecified convulsions: Secondary | ICD-10-CM | POA: Diagnosis not present

## 2022-04-01 LAB — BASIC METABOLIC PANEL
Anion gap: 7 (ref 5–15)
BUN: 8 mg/dL (ref 8–23)
CO2: 26 mmol/L (ref 22–32)
Calcium: 9.2 mg/dL (ref 8.9–10.3)
Chloride: 107 mmol/L (ref 98–111)
Creatinine, Ser: 0.86 mg/dL (ref 0.61–1.24)
GFR, Estimated: >60 mL/min (ref >60)
Glucose, Bld: 90 mg/dL (ref 70–99)
Potassium: 3.7 mmol/L (ref 3.5–5.1)
Sodium: 140 mmol/L (ref 135–145)

## 2022-04-01 LAB — CBC
HCT: 43.1 % (ref 39.0–52.0)
Hemoglobin: 14.1 g/dL (ref 13.0–17.0)
MCH: 30 pg (ref 26.0–34.0)
MCHC: 32.7 g/dL (ref 30.0–36.0)
MCV: 91.7 fL (ref 80.0–100.0)
Platelets: 243 10*3/uL (ref 150–400)
RBC: 4.7 MIL/uL (ref 4.22–5.81)
RDW: 13.2 % (ref 11.5–15.5)
WBC: 5.1 10*3/uL (ref 4.0–10.5)
nRBC: 0 % (ref 0.0–0.2)

## 2022-04-01 LAB — GLUCOSE, CAPILLARY: Glucose-Capillary: 151 mg/dL — ABNORMAL HIGH (ref 70–99)

## 2022-04-01 NOTE — Progress Notes (Signed)
Neurology Progress Note ? ? ?S:// ?Patient is sitting up in bead eating breakfast. No family at bedside. Patient tells me he slept well and feels much better ? ? ? ?O:// ?Current vital signs: ?BP (!) 153/80 (BP Location: Right Arm)   Pulse 79   Temp 98.5 ?F (36.9 ?C) (Oral)   Resp 16   Ht 5\' 5"  (1.651 m)   Wt 64.2 kg   SpO2 100%   BMI 23.55 kg/m?  ?Vital signs in last 24 hours: ?Temp:  [98.1 ?F (36.7 ?C)-99 ?F (37.2 ?C)] 98.5 ?F (36.9 ?C) (04/21 0750) ?Pulse Rate:  [66-79] 79 (04/21 0750) ?Resp:  [16-19] 16 (04/21 0750) ?BP: (110-153)/(62-87) 153/80 (04/21 0750) ?SpO2:  [95 %-100 %] 100 % (04/21 0750) ? ?General: sitting up in bed eating breakfast in NAD  ?HEENT: Normocephalic atraumatic ?Lungs: Clear ?Cardiovascular: Regular rate rhythm ?Abdomen soft nondistended nontender ?Extremities, warm, left arm and leg with increased tone and contractures and edema ?Neurological exam ?Awake alert oriented x1 (baseline) ?Follows simple commands ?Speech is clear ?Cranial nerves: Pupils are equal round reactive light, extraocular movements intact, gaze preference to the right, can look towards the left but does not go all the way, face mild left facial droop. ?Motor examination with left spastic hemiplegia, right side with 4+/5 strength. ?Sensation intact ?Coordination intact on the right ?Gait deferred ?Unchanged exam ? ?Medications ? ?Current Facility-Administered Medications:  ?  acetaminophen (TYLENOL) tablet 650 mg, 650 mg, Oral, Q6H PRN **OR** acetaminophen (TYLENOL) suppository 650 mg, 650 mg, Rectal, Q6H PRN, 09-20-1996, MD ?  lacosamide (VIMPAT) 100 mg in sodium chloride 0.9 % 25 mL IVPB, 100 mg, Intravenous, Q12H, Zannie Cove, DO, Last Rate: 70 mL/hr at 03/31/22 2234, 100 mg at 03/31/22 2234 ?  multivitamin with minerals tablet 1 tablet, 1 tablet, Oral, Daily, 2235, MD, 1 tablet at 03/31/22 04/02/22 ?  ondansetron (ZOFRAN) tablet 4 mg, 4 mg, Oral, Q6H PRN **OR** ondansetron (ZOFRAN) injection 4  mg, 4 mg, Intravenous, Q6H PRN, 0347, MD ?  senna-docusate (Senokot-S) tablet 1 tablet, 1 tablet, Oral, QHS PRN, Zannie Cove, MD ?  valproate (DEPACON) 500 mg in dextrose 5 % 50 mL IVPB, 500 mg, Intravenous, Q8H, Zannie Cove A, NP, Last Rate: 55 mL/hr at 04/01/22 0553, 500 mg at 04/01/22 0553 ?Labs ?CBC ?   ?Component Value Date/Time  ? WBC 5.1 04/01/2022 0421  ? RBC 4.70 04/01/2022 0421  ? HGB 14.1 04/01/2022 0421  ? HGB 16.1 09/11/2020 1449  ? HCT 43.1 04/01/2022 0421  ? HCT 48.6 09/11/2020 1449  ? PLT 243 04/01/2022 0421  ? PLT 234 04/03/2017 0923  ? MCV 91.7 04/01/2022 0421  ? MCV 91 09/11/2020 1449  ? MCH 30.0 04/01/2022 0421  ? MCHC 32.7 04/01/2022 0421  ? RDW 13.2 04/01/2022 0421  ? RDW 13.1 09/11/2020 1449  ? LYMPHSABS 2.2 03/27/2022 1545  ? LYMPHSABS 2.1 09/11/2020 1449  ? MONOABS 0.3 03/27/2022 1545  ? EOSABS 0.1 03/27/2022 1545  ? EOSABS 0.0 09/11/2020 1449  ? BASOSABS 0.0 03/27/2022 1545  ? BASOSABS 0.0 09/11/2020 1449  ? ? ?CMP  ?   ?Component Value Date/Time  ? NA 140 04/01/2022 0421  ? NA 143 09/11/2020 1449  ? K 3.7 04/01/2022 0421  ? CL 107 04/01/2022 0421  ? CO2 26 04/01/2022 0421  ? GLUCOSE 90 04/01/2022 0421  ? BUN 8 04/01/2022 0421  ? BUN 10 09/11/2020 1449  ? CREATININE 0.86 04/01/2022 0421  ? CREATININE 1.37 (H) 04/29/2020 05/01/2020  ?  CALCIUM 9.2 04/01/2022 0421  ? PROT 6.9 03/30/2022 0335  ? PROT 7.6 09/11/2020 1449  ? ALBUMIN 3.9 03/30/2022 0335  ? ALBUMIN 4.8 09/11/2020 1449  ? AST 26 03/30/2022 0335  ? ALT 17 03/30/2022 0335  ? ALKPHOS 147 (H) 03/30/2022 0335  ? BILITOT 0.9 03/30/2022 0335  ? BILITOT 0.5 09/11/2020 1449  ? GFRNONAA >60 04/01/2022 0421  ? GFRNONAA 55 (L) 04/29/2020 0826  ? GFRAA 76 09/11/2020 1449  ? GFRAA 63 04/29/2020 0826  ? ?Valproate level 94 (4/19) ?Ammonia 25 (4/19) ? ?Imaging ?I have reviewed images in epic and the results pertinent to this consultation are: ?CT head 418-no acute abnormality. ? ?Assessment: 66 year old man past history of ICH with  residual left spastic hemiplegia and cognitive impairment, sleep apnea, presented to the emergency room for multiple episodes concerning for seizures.  Witnessed episode by primary team with focal onset with secondary generalization along with rightward gaze preference.  EEG confirmed the presence of seizures. ?Loaded with Depakote-EEG continued to show seizure activity.  Vimpat added 03/30/22 ?EEG appeared better and continuous EEG monitoring was discontinued. ?Appears to be back to his baseline on exam ? 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 ? ?Recommendations: ?-Continue valproate 500 twice daily ?-Continue Vimpat 100 twice daily ?-Maintain seizure precautions ?Outpatient neurology follow-up in 6 to 8 weeks. ?Neurology will be available as needed. ? ?Gevena Mart DNP, ACNPC-AG ? ? ?Attending Neurohospitalist Addendum ?Patient seen and examined with APP/Resident. ?Agree with the history and physical as documented above. ?Agree with the plan as documented, which I helped formulate. ?I have independently reviewed the chart, obtained history, review of systems and examined the patient.I have personally reviewed pertinent head/neck/spine imaging (CT/MRI). ?No seizures overnight.  Looking to be at his baseline.  Continue antiepileptics indefinitely-valproate 500 twice daily and Vimpat 100 twice daily. ?Please feel free to call with any questions. ? ?-- ?Milon Dikes, MD ?Neurologist ?[facility] ?Pager: [phone] ? ? ?

## 2022-04-01 NOTE — Progress Notes (Signed)
According to wife, appears more drowsy at times to her. Has been off-and-on more drowsy. ?We will check a Depakote and ammonia level in the morning ?We will follow those with you. ?PT OT per primary team ?Discussed with Dr. Jomarie Longs ? ?-- ?Milon Dikes, MD ?Neurologist ?[facility] ?Pager: [phone] ? ?

## 2022-04-01 NOTE — Progress Notes (Signed)
?PROGRESS NOTE ? ? ? 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  Was brought to the ED on account of multiple episodes of leg jerking, twitching followed by eyes rolling backwards and facial, jaw twitching episodes which last for 2 to 3 minutes. ?-Admitted with breakthrough seizures, seen by neurology, LTM was positive, started on Depakote ?-LTM noted additional seizures, was started on Vimpat 4/19 ?-Slowly improving ? ?Subjective: ?-According to wife he is no longer having seizure-like episodes but appears much more drowsy than baseline, eyes closed most of the day, eating when fed ? ?Assessment and Plan: ? ?Breakthrough seizures ?Encephalopathy ?-In the setting of prior ICH, CT head with no acute findings, sequelae of prior ICH and atrophy noted ?-Neurology consulting, EEG confirmed seizure activity ?-Started on IV Depakote 4/18,  continued to have focal motor seizures and subclinical seizures after this, started on IV Vimpat 4/19 ?-LTM 4/20 was improved, discontinued ?-CT head and other labs unremarkable ?-Per wife he is more drowsy than baseline, suspect secondary to meds, discussed with neuro, plan to check ammonia and Depakote level in a.m., afebrile no leukocytosis ?-PT OT eval today ?  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 Pearlean Brownie and physiatry Dr. Hermelinda Medicus ?  ?Cognitive deficits, vascular Dementia (HCC) ?-Delirium precautions ?  ?DVT prophylaxis: lovenox ?Code Status: Full code ?Family Communication: Discussed with wife at bedside ?Disposition Plan: Home in 1 to 2 days if neurological status is stable ? ?Consultants:  ?Neurology ? ?Procedures: LTM ? ?Antimicrobials:  ? ? ?Objective: ?Vitals:  ? 03/31/22 2315 04/01/22  0325 04/01/22 0750 04/01/22 1130  ?BP: (!) 148/87 (!) 146/74 (!) 153/80 (!) 142/81  ?Pulse: 74 73 79 70  ?Resp: 19 18 16 16   ?Temp: 98.5 ?F (36.9 ?C) 98.6 ?F (37 ?C) 98.5 ?F (36.9 ?C) 98.6 ?F (37 ?C)  ?TempSrc: Oral Oral Oral Oral  ?SpO2: 100% 100% 100% 100%  ?Weight:      ?Height:      ? ? ?Intake/Output Summary (Last 24 hours) at 04/01/2022 1345 ?Last data filed at 04/01/2022 0800 ?Gross per 24 hour  ?Intake 730 ml  ?Output 450 ml  ?Net 280 ml  ? ?Filed Weights  ? 03/29/22 0618 03/29/22 2100  ?Weight: 68 kg 64.2 kg  ? ? ?Examination: ? ?Chronically ill male appears much older than stated age, eyes closed, answers few questions in simple commands, oriented to self and partly to place ?HEENT pupils are reactive, facial droop noted ?CVS: S1-S2, regular rate rhythm ?Lungs: Improved air movement, clear bilaterally ?Abdomen: Soft, nontender, bowel sounds present ?Extremities: No edema, spastic left hemiplegia ?Neuro: Poor insight and judgment, left hemiplegia, spasticity noted ?Psych: Flat affect ? ? ? ? ?Data Reviewed:  ? ?CBC: ?Recent Labs  ?Lab 03/27/22 ?1545 03/29/22 ?03/31/22 03/30/22 ?0708 04/01/22 ?0421  ?WBC 3.8* 3.7* 8.2 5.1  ?NEUTROABS 1.2*  --   --   --   ?HGB 13.8 13.4 13.3 14.1  ?HCT 42.9 45.5 42.3 43.1  ?MCV 93.7 102.0* 96.1 91.7  ?PLT 254 102* 230 243  ? ?Basic Metabolic Panel: ?Recent Labs  ?Lab 03/27/22 ?1521 03/29/22 ?03/31/22 03/30/22 ?0335 03/30/22 ?0915 04/01/22 ?0421  ?NA 138 138 140 142 140  ?K 4.9 3.7 4.1 4.5 3.7  ?CL 104 110 108 108 107  ?CO2 27  21* 18* 19* 26  ?GLUCOSE 91 92 60* 69* 90  ?BUN 7* 11 13 16 8   ?CREATININE 1.03 1.12 1.20 1.11 0.86  ?CALCIUM 10.1 9.0 9.5 9.4 9.2  ?MG 2.1  --   --   --   --   ? ?GFR: ?Estimated Creatinine Clearance: 74.5 mL/min (by C-G formula based on SCr of 0.86 mg/dL). ?Liver Function Tests: ?Recent Labs  ?Lab 03/27/22 ?1521 03/30/22 ?0335  ?AST 20 26  ?ALT 11 17  ?ALKPHOS 139* 147*  ?BILITOT 0.6 0.9  ?PROT 7.0 6.9  ?ALBUMIN 4.2 3.9  ? ?No results for input(s): LIPASE,  AMYLASE in the last 168 hours. ?Recent Labs  ?Lab 03/30/22 ?0915  ?AMMONIA 25  ? ?Coagulation Profile: ?No results for input(s): INR, PROTIME in the last 168 hours. ?Cardiac Enzymes: ?No results for input(s): CKTOTAL, CKMB, CKMBINDEX, TROPONINI in the last 168 hours. ?BNP (last 3 results) ?No results for input(s): PROBNP in the last 8760 hours. ?HbA1C: ?No results for input(s): HGBA1C in the last 72 hours. ?CBG: ?No results for input(s): GLUCAP in the last 168 hours. ?Lipid Profile: ?No results for input(s): CHOL, HDL, LDLCALC, TRIG, CHOLHDL, LDLDIRECT in the last 72 hours. ?Thyroid Function Tests: ?No results for input(s): TSH, T4TOTAL, FREET4, T3FREE, THYROIDAB in the last 72 hours. ?Anemia Panel: ?No results for input(s): VITAMINB12, FOLATE, FERRITIN, TIBC, IRON, RETICCTPCT in the last 72 hours. ?Urine analysis: ?   ?Component Value Date/Time  ? COLORURINE YELLOW 03/29/2022 1049  ? APPEARANCEUR HAZY (A) 03/29/2022 1049  ? APPEARANCEUR Clear 09/11/2020 1449  ? LABSPEC 1.024 03/29/2022 1049  ? PHURINE 6.0 03/29/2022 1049  ? GLUCOSEU NEGATIVE 03/29/2022 1049  ? HGBUR NEGATIVE 03/29/2022 1049  ? BILIRUBINUR NEGATIVE 03/29/2022 1049  ? BILIRUBINUR Negative 09/11/2020 1449  ? KETONESUR NEGATIVE 03/29/2022 1049  ? PROTEINUR NEGATIVE 03/29/2022 1049  ? UROBILINOGEN 0.2 04/15/2016 1108  ? NITRITE NEGATIVE 03/29/2022 1049  ? LEUKOCYTESUR NEGATIVE 03/29/2022 1049  ? ?Sepsis Labs: ?@[email or web address] (procalcitonin:4,lacticidven:4) ? ?)No results found for this or any previous visit (from the past 240 hour(s)).  ? ?Radiology Studies: ?No results found. ? ? ?Scheduled Meds: ? multivitamin with minerals  1 tablet Oral Daily  ? ?Continuous Infusions: ? lacosamide (VIMPAT) IV 100 mg (04/01/22 1015)  ? valproate sodium 500 mg (04/01/22 1333)  ? ? ? LOS: 3 days  ? ? ?Time spent: 04/03/22 ? ? ?04/03/22, MD ?[facility] ? ? ?04/01/2022, 1:45 PM  ?  ?

## 2022-04-02 DIAGNOSIS — F0390 Unspecified dementia without behavioral disturbance: Secondary | ICD-10-CM | POA: Diagnosis not present

## 2022-04-02 DIAGNOSIS — R569 Unspecified convulsions: Secondary | ICD-10-CM | POA: Diagnosis not present

## 2022-04-02 LAB — CBC
HCT: 42.9 % (ref 39.0–52.0)
Hemoglobin: 14.2 g/dL (ref 13.0–17.0)
MCH: 30.4 pg (ref 26.0–34.0)
MCHC: 33.1 g/dL (ref 30.0–36.0)
MCV: 91.9 fL (ref 80.0–100.0)
Platelets: 239 10*3/uL (ref 150–400)
RBC: 4.67 MIL/uL (ref 4.22–5.81)
RDW: 13.2 % (ref 11.5–15.5)
WBC: 4.9 10*3/uL (ref 4.0–10.5)
nRBC: 0 % (ref 0.0–0.2)

## 2022-04-02 LAB — AMMONIA: Ammonia: 71 umol/L — ABNORMAL HIGH (ref 9–35)

## 2022-04-02 LAB — GLUCOSE, CAPILLARY: Glucose-Capillary: 85 mg/dL (ref 70–99)

## 2022-04-02 LAB — VALPROIC ACID LEVEL: Valproic Acid Lvl: 123 ug/mL — ABNORMAL HIGH (ref 50.0–100.0)

## 2022-04-02 MED ORDER — SODIUM CHLORIDE 0.9 % IV SOLN
250.0000 mg | Freq: Two times a day (BID) | INTRAVENOUS | Status: DC
  Administered 2022-04-02: 250 mg via INTRAVENOUS
  Filled 2022-04-02 (×3): qty 2.5

## 2022-04-02 MED ORDER — POLYETHYLENE GLYCOL 3350 17 G PO PACK
17.0000 g | PACK | Freq: Every day | ORAL | Status: DC
  Administered 2022-04-04 – 2022-04-07 (×3): 17 g via ORAL
  Filled 2022-04-02 (×5): qty 1

## 2022-04-02 MED ORDER — AMLODIPINE BESYLATE 2.5 MG PO TABS
2.5000 mg | ORAL_TABLET | Freq: Every day | ORAL | Status: DC
  Administered 2022-04-04 – 2022-04-07 (×4): 2.5 mg via ORAL
  Filled 2022-04-02 (×5): qty 1

## 2022-04-02 MED ORDER — LEVETIRACETAM IN NACL 500 MG/100ML IV SOLN
500.0000 mg | Freq: Once | INTRAVENOUS | Status: AC
  Administered 2022-04-02: 500 mg via INTRAVENOUS
  Filled 2022-04-02: qty 100

## 2022-04-02 NOTE — Evaluation (Signed)
Physical Therapy Evaluation ?Patient Details ?Name: Donald [name] ?MRN: [id number] ?DOB: 21-Dec-1955 ?Today's Date: 04/02/2022 ? ?History of Present Illness ? Pt is a 66yo male who was brought to ED for seizure like activity and was found to have had seizures. 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  ?Clinical Impression ? Pt admitted with above. Per spouse, she was able to transfer pt to EOB and into w/c using the stedy on her own PTA. Pt now requiring maxAX2. Pt with impaired sitting balance, weakness, and activity tolerance. Pt to benefit from AIR upon d/c to achieve previously level of function for safe transition home with spouse.   Acute PT to cont to follow. ? ?   ? ?Recommendations for follow up therapy are one component of a multi-disciplinary discharge planning process, led by the attending physician.  Recommendations may be updated based on patient status, additional functional criteria and insurance authorization. ? ?Follow Up Recommendations Acute inpatient rehab (3hours/day) ? ?  ?Assistance Recommended at Discharge Frequent or constant Supervision/Assistance  ?Patient can return home with the following ? A lot of help with walking and/or transfers;A lot of help with bathing/dressing/bathroom;Direct supervision/assist for medications management;Direct supervision/assist for financial management;Assist for transportation;Help with stairs or ramp for entrance;Assistance with feeding;Assistance with cooking/housework ? ?  ?Equipment Recommendations None recommended by PT  ?Recommendations for Other Services ? Rehab consult  ?  ?Functional Status Assessment Patient has had a recent decline in their functional status and demonstrates the ability to make significant improvements in function in a reasonable and predictable amount of time.  ? ?  ?Precautions / Restrictions Precautions ?Precautions: Fall ?Precaution Comments: seizure ?Required Braces or Orthoses:  Splint/Cast ?Splint/Cast: pt's wife reports pt has two different day-time splints for his L hand/wrist (asked wife to bring them acutely) ?Restrictions ?Weight Bearing Restrictions: No  ? ?  ? ?Mobility ? Bed Mobility ?Overal bed mobility: Needs Assistance (Simultaneous filing. User may not have seen previous data.) ?  ?  ?  ?  ?  ?  ?General bed mobility comments: totalAX2, at LEs and trunk elevation, pt unable to assist at all ?  ? ?Transfers ?Overall transfer level: Needs assistance (Simultaneous filing. User may not have seen previous data.) ?Equipment used: Ambulation equipment used (Simultaneous filing. User may not have seen previous data.) ?  ?  ?  ?  ?  ?  ?  ?General transfer comment: used the stedy with maxAx2 to bring up into standing x2, pt did help pull up with R UE (Simultaneous filing. User may not have seen previous data.) ?  ? ?Ambulation/Gait ?  ?  ?  ?  ?  ?  ?  ?General Gait Details: pt non-ambulatory ? ?Stairs ?  ?  ?  ?  ?  ? ?Wheelchair Mobility ?  ? ?Modified Rankin (Stroke Patients Only) ?  ? ?  ? ?Balance Overall balance assessment: Needs assistance (Simultaneous filing. User may not have seen previous data.) ?  ?  ?Sitting balance - Comments: maxA posteriorly to prevent falling ?  ?  ?  ?Standing balance comment: dependent on stedy ?  ?  ?  ?  ?  ?  ?  ?  ?  ?  ?  ?   ? ? ? ?Pertinent Vitals/Pain Pain Assessment ?Pain Assessment: Faces ?Faces Pain Scale: Hurts little more ?Pain Location: grimacing with mobility  ? ? ?Home Living Family/patient expects to be discharged to:: Private residence ?Living Arrangements:  Spouse/significant other ?Available Help at Discharge: Family;Available 24 hours/day ?Type of Home: Apartment ?Home Access: Level entry ?  ?  ?  ?Home Layout: One level ?Home Equipment: Shower seat - built in;Grab bars - toilet;Grab bars - tub/shower;Wheelchair - manual;Other (comment) (steady) ?Additional Comments: has access to RW from pt's MIL  ?  ?Prior Function Prior Level of  Function : Needs assist ?  ?  ?  ?Physical Assist : Mobility (physical);ADLs (physical) ?Mobility (physical): Bed mobility;Transfers;Stairs;Gait ?ADLs (physical): Feeding;Grooming;Bathing;Dressing;Toileting;IADLs ?Mobility Comments: wc level, uses steady to transfer with wife assist. dependent for wc mobility ?ADLs Comments: wife assists wtih all aspects of care - sponge bathes in the seady; pt can assist with grooming and feeding ?  ? ? ?Hand Dominance  ? Dominant Hand: Right ? ?  ?Extremity/Trunk Assessment  ? Upper Extremity Assessment ?Upper Extremity Assessment: LUE deficits/detail ?RUE Deficits / Details: difficult to assess - pt not following commands. uses to wash face and scratch the top of his head ?LUE Deficits / Details: L spastic hemiplegia ?  ? ?Lower Extremity Assessment ?Lower Extremity Assessment: LLE deficits/detail ?LLE Deficits / Details: L weakness/spastic hemiplegia, knee contracture ?  ? ?Cervical / Trunk Assessment ?Cervical / Trunk Assessment: Kyphotic (forward head)  ?Communication  ? Communication: Expressive difficulties (limited verbalizations made - stating yes/no appropriately)  ?Cognition Arousal/Alertness: Awake/alert (however keeps his eyes closed (this is baseline)) ?Behavior During Therapy: Flat affect ?Overall Cognitive Status: Impaired/Different from baseline ?  ?  ?  ?  ?  ?  ?  ?  ?  ?  ?  ?  ?  ?  ?  ?  ?General Comments: pt able to answer questions, limited ability to initiate movment due to physical impairments ?  ?  ? ?  ?General Comments General comments (skin integrity, edema, etc.): vss, flexion contractures at L elbow and knee (Simultaneous filing. User may not have seen previous data.) ? ?  ?Exercises    ? ?Assessment/Plan  ?  ?PT Assessment Patient needs continued PT services  ?PT Problem List Decreased strength;Decreased activity tolerance;Decreased balance;Decreased coordination;Decreased cognition;Decreased knowledge of use of DME ? ?   ?  ?PT Treatment  Interventions DME instruction;Gait training;Functional mobility training;Therapeutic activities;Therapeutic exercise;Balance training;Neuromuscular re-education;Cognitive remediation;Patient/family education   ? ?PT Goals (Current goals can be found in the Care Plan section)  ?Acute Rehab PT Goals ?PT Goal Formulation: With patient/family ?Time For Goal Achievement: 04/16/22 ?Potential to Achieve Goals: Good ? ?  ?Frequency Min 3X/week ?  ? ? ?Co-evaluation PT/OT/SLP Co-Evaluation/Treatment: Yes ?Reason for Co-Treatment: Complexity of the patient's impairments (multi-system involvement) ?PT goals addressed during session: Mobility/safety with mobility ?OT goals addressed during session: ADL's and self-care ?  ? ? ?  ?AM-PAC PT "6 Clicks" Mobility  ?Outcome Measure Help needed turning from your back to your side while in a flat bed without using bedrails?: Total ?Help needed moving from lying on your back to sitting on the side of a flat bed without using bedrails?: Total ?Help needed moving to and from a bed to a chair (including a wheelchair)?: Total ?Help needed standing up from a chair using your arms (e.g., wheelchair or bedside chair)?: Total ?Help needed to walk in hospital room?: Total ?Help needed climbing 3-5 steps with a railing? : Total ?6 Click Score: 6 ? ?  ?End of Session   ?Activity Tolerance: Patient tolerated treatment well ?Patient left: in bed;with call bell/phone within reach;with bed alarm set;with family/visitor present ?Nurse Communication: Mobility status ?PT Visit Diagnosis:  Muscle weakness (generalized) (M62.81) ?  ? ?Time: 8099-8338 ?PT Time Calculation (min) (ACUTE ONLY): 29 min ? ? ?Charges:   PT Evaluation ?$PT Eval Moderate Complexity: 1 Mod ?  ?  ?   ? ? ?Lewis Shock, PT, DPT ?Acute Rehabilitation Services ?Secure chat preferred ?Office #: [phone] ? ? ?Akina Maish M Tomas Schamp ?04/02/2022, 2:31 PM ? ?

## 2022-04-02 NOTE — Evaluation (Signed)
Occupational Therapy Evaluation ?Patient Details ?Name: Donald [name] ?MRN: [id number] ?DOB: 13-Dec-1955 ?Today's Date: 04/02/2022 ? ? ?History of Present Illness Donald [name] is a 66/male who presented from home after  multiple episodes of leg jerking, twitching followed by eyes rolling backwards and facial, jaw twitching episodes which last for 2 to 3 minutes. EEG confirmed seizure activity. 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 ?Clinical Impression ?  ?[name] was evaluated s/p the above admission list. He requires assist for all aspects of his care at baseline since his CVA 1 year ago. He is wc level, dependent for mobility and transfers with use of steady and his wife's assist. He is able to sit in the steady frame for self care with his wife's assist. Upon evaluation pt kept eye eyes shut 100% of the session (this is normal per wife), he followed some functional commands, but not well enough to complete MMT testing. Overall he is total A +2 for bed mobility, sit<>stand with steady frame and most BADLs. He will benefit from OT acutely to address the limitations listed below. Recommend CIR for maximal recovery towards his baseline level in which his wife can safely assist him at home.  ?   ? ?Recommendations for follow up therapy are one component of a multi-disciplinary discharge planning process, led by the attending physician.  Recommendations may be updated based on patient status, additional functional criteria and insurance authorization.  ? ?Follow Up Recommendations ? Acute inpatient rehab (3hours/day)  ?  ?Assistance Recommended at Discharge Frequent or constant Supervision/Assistance  ?Patient can return home with the following A lot of help with walking and/or transfers;A lot of help with bathing/dressing/bathroom;Assistance with feeding;Direct supervision/assist for medications  management;Direct supervision/assist for financial management;Assist for transportation;Help with stairs or ramp for entrance ? ?  ?Functional Status Assessment ? Patient has had a recent decline in their functional status and demonstrates the ability to make significant improvements in function in a reasonable and predictable amount of time.  ?Equipment Recommendations ? None recommended by OT  ?  ?Recommendations for Other Services Rehab consult ? ? ?  ?Precautions / Restrictions Precautions ?Precautions: Fall ?Precaution Comments: seizure ?Required Braces or Orthoses: Splint/Cast ?Splint/Cast: pt's wife reports pt has two different day-time splints for his L hand/wrist (asked wife to bring them acutely) ?Restrictions ?Weight Bearing Restrictions: No  ? ?  ? ?Mobility Bed Mobility ?Overal bed mobility: Needs Assistance ?Bed Mobility: Supine to Sit, Sit to Supine ?  ?  ?Supine to sit: Total assist, +2 for physical assistance, +2 for safety/equipment ?Sit to supine: Total assist, +2 for physical assistance, +2 for safety/equipment ?  ?  ?  ? ?Transfers ?Overall transfer level: Needs assistance ?Equipment used: Ambulation equipment used ?Transfers: Sit to/from Stand ?Sit to Stand: Total assist, +2 physical assistance, +2 safety/equipment ?  ?  ?  ?  ?  ?General transfer comment: +3 helpful. used steady frame for sit<>stand to simulate baseline home transfers ?  ? ?  ?Balance Overall balance assessment: Needs assistance ?Sitting-balance support: Feet supported, Single extremity supported ?Sitting balance-Leahy Scale: Poor ?  ?  ?Standing balance support: Single extremity supported, Reliant on assistive device for balance ?Standing balance-Leahy Scale: Zero ?  ?  ?  ?  ?  ?   ? ?ADL either performed or assessed with clinical judgement  ? ?ADL Overall ADL's : Needs assistance/impaired ?  ?  ?  ?  ?  ?  ?  General ADL Comments: pt can assist wtih grooming and feeding, other than that he is generally dependent with all  aspects of is care needing total A +2 at bed level or in the steady frame  ? ? ? ?Vision Baseline Vision/History: 0 No visual deficits ?Patient Visual Report: Other (comment) (pt's wife states he normally keeps his eyes closed) ?Vision Assessment?: Vision impaired- to be further tested in functional context ?Additional Comments: eyes closed 100% of the session  ?   ?   ?   ? ?Pertinent Vitals/Pain Pain Assessment ?Pain Assessment: Faces ?Faces Pain Scale: Hurts even more ?Pain Location: generalized with movement ?Pain Descriptors / Indicators: Discomfort, Guarding, Grimacing ?Pain Intervention(s): Limited activity within patient's tolerance, Monitored during session  ? ? ? ?Hand Dominance Right ?  ?Extremity/Trunk Assessment Upper Extremity Assessment ?Upper Extremity Assessment: LUE deficits/detail;RUE deficits/detail ?RUE Deficits / Details: difficult to assess - pt not following commands. uses to wash face and scratch the top of his head ?LUE Deficits / Details: increased tone, contracted in a protective flexed position, decreased sensation, no active movement noted ?  ?Lower Extremity Assessment ?Lower Extremity Assessment: Defer to PT evaluation ?  ?Cervical / Trunk Assessment ?Cervical / Trunk Assessment: Kyphotic (forward head posture) ?  ?Communication Communication ?Communication: Expressive difficulties (limited verbalizations made - stating yes/no appropriately) ?  ?Cognition Arousal/Alertness: Lethargic ?Behavior During Therapy: Flat affect ?Overall Cognitive Status: Impaired/Different from baseline ?Area of Impairment: Orientation, Attention, Memory, Following commands, Safety/judgement, Awareness, Problem solving ?  ?  ?  ?  ?Orientation Level: Place, Time, Situation ?Current Attention Level: Focused ?  ?Following Commands: Follows one step commands with increased time ?  ?Awareness: Intellectual ?Problem Solving: Slow processing, Decreased initiation, Requires verbal cues, Requires tactile  cues ?General Comments: minimal verbalizations made. follows some simple commands with increased time and multimodal cues. eyes closed 100% of the session ?  ?  ?General Comments  VSS on RA, family present and supportive ? ?  ? ?Home Living Family/patient expects to be discharged to:: Private residence ?Living Arrangements: Spouse/significant other ?Available Help at Discharge: Family;Available 24 hours/day ?Type of Home: Apartment ?Home Access: Level entry ?  ?  ?Home Layout: One level ?  ?  ?Bathroom Shower/Tub: Walk-in shower;Sponge bathes at baseline ?  ?Bathroom Toilet: Standard ?Bathroom Accessibility: Yes ?  ?Home Equipment: Shower seat - built in;Grab bars - toilet;Grab bars - tub/shower;Wheelchair - manual;Other (comment) (steady) ?  ?Additional Comments: has access to RW from pt's MIL ?  ? ?  ?Prior Functioning/Environment Prior Level of Function : Needs assist ?  ?  ?  ?Physical Assist : Mobility (physical);ADLs (physical) ?Mobility (physical): Bed mobility;Transfers;Stairs;Gait ?ADLs (physical): Feeding;Grooming;Bathing;Dressing;Toileting;IADLs ?Mobility Comments: wc level, uses steady to transfer with wife assist. dependent for wc mobility ?ADLs Comments: wife assists wtih all aspects of care - sponge bathes in the seady; pt can assist with grooming and feeding ?  ? ?  ?  ?OT Problem List: Decreased strength;Decreased activity tolerance;Decreased range of motion;Impaired balance (sitting and/or standing);Impaired vision/perception;Decreased coordination;Decreased cognition;Decreased safety awareness;Impaired UE functional use ?  ?   ?OT Treatment/Interventions: Self-care/ADL training;Therapeutic exercise;DME and/or AE instruction;Therapeutic activities;Patient/family education;Balance training  ?  ?OT Goals(Current goals can be found in the care plan section) Acute Rehab OT Goals ?Patient Stated Goal: per wife - back to baseline ?OT Goal Formulation: With patient/family ?Time For Goal Achievement:  04/16/22 ?Potential to Achieve Goals: Good ?ADL Goals ?Pt Will Perform Grooming: with mod assist;sitting ?Pt Will Transfer to Toilet: with mod assist ?  Additional ADL Goal #1: Pt will complete bed mobility with mod A as a

## 2022-04-02 NOTE — Progress Notes (Signed)
Neurology Progress Note ? ? ?S:// ?Sleeping comfortably in bed. ? ? ?O:// ?Current vital signs: ?BP (!) 144/86 (BP Location: Right Arm)   Pulse 72   Temp 97.9 ?F (36.6 ?C) (Oral)   Resp 19   Ht 5\' 5"  (1.651 m)   Wt 65.2 kg   SpO2 100%   BMI 23.92 kg/m?  ?Vital signs in last 24 hours: ?Temp:  [97.9 ?F (36.6 ?C)-98.6 ?F (37 ?C)] 97.9 ?F (36.6 ?C) (04/22 0820) ?Pulse Rate:  [67-78] 72 (04/22 0820) ?Resp:  [15-19] 19 (04/22 0820) ?BP: (131-144)/(76-86) 144/86 (04/22 0820) ?SpO2:  [100 %] 100 % (04/22 0820) ?Weight:  [65.2 kg] 65.2 kg (04/22 0553) ?General: Comfortably sleeping in bed, responds with single word answers ?HEENT: Normocephalic atraumatic ?Lungs clear ?Cardiovascular-regular rate rhythm ?Neurological exam ?Drowsy, does not open eyes to voice but responds to questions. ?Oriented x1 ?No gross dysarthria ?Cranial nerves: Pupils equal round reactive light, extraocular movements intact, somewhat right gaze preference, can look past midline but not all the way to the left.  Mild left facial droop. ?Motor exam with left spastic hemiplegia.  Right side with 4+/5 strength ?Sensation intact ?Coordination intact on the right ? ?Medications ? ?Current Facility-Administered Medications:  ?  acetaminophen (TYLENOL) tablet 650 mg, 650 mg, Oral, Q6H PRN **OR** acetaminophen (TYLENOL) suppository 650 mg, 650 mg, Rectal, Q6H PRN, Domenic Polite, MD ?  lacosamide (VIMPAT) 100 mg in sodium chloride 0.9 % 25 mL IVPB, 100 mg, Intravenous, Q12H, Merrily Brittle, DO, Stopped at 04/01/22 2250 ?  multivitamin with minerals tablet 1 tablet, 1 tablet, Oral, Daily, Domenic Polite, MD, 1 tablet at 04/01/22 1015 ?  ondansetron (ZOFRAN) tablet 4 mg, 4 mg, Oral, Q6H PRN **OR** ondansetron (ZOFRAN) injection 4 mg, 4 mg, Intravenous, Q6H PRN, Domenic Polite, MD ?  senna-docusate (Senokot-S) tablet 1 tablet, 1 tablet, Oral, QHS PRN, Domenic Polite, MD ?Labs ?CBC ?   ?Component Value Date/Time  ? WBC 4.9 04/02/2022 0151  ? RBC 4.67  04/02/2022 0151  ? HGB 14.2 04/02/2022 0151  ? HGB 16.1 09/11/2020 1449  ? HCT 42.9 04/02/2022 0151  ? HCT 48.6 09/11/2020 1449  ? PLT 239 04/02/2022 0151  ? PLT 234 04/03/2017 0923  ? MCV 91.9 04/02/2022 0151  ? MCV 91 09/11/2020 1449  ? MCH 30.4 04/02/2022 0151  ? MCHC 33.1 04/02/2022 0151  ? RDW 13.2 04/02/2022 0151  ? RDW 13.1 09/11/2020 1449  ? LYMPHSABS 2.2 03/27/2022 1545  ? LYMPHSABS 2.1 09/11/2020 1449  ? MONOABS 0.3 03/27/2022 1545  ? EOSABS 0.1 03/27/2022 1545  ? EOSABS 0.0 09/11/2020 1449  ? BASOSABS 0.0 03/27/2022 1545  ? BASOSABS 0.0 09/11/2020 1449  ? ? ?CMP  ?   ?Component Value Date/Time  ? NA 140 04/01/2022 0421  ? NA 143 09/11/2020 1449  ? K 3.7 04/01/2022 0421  ? CL 107 04/01/2022 0421  ? CO2 26 04/01/2022 0421  ? GLUCOSE 90 04/01/2022 0421  ? BUN 8 04/01/2022 0421  ? BUN 10 09/11/2020 1449  ? CREATININE 0.86 04/01/2022 0421  ? CREATININE 1.37 (H) 04/29/2020 [id number]  ? CALCIUM 9.2 04/01/2022 0421  ? PROT 6.9 03/30/2022 0335  ? PROT 7.6 09/11/2020 1449  ? ALBUMIN 3.9 03/30/2022 0335  ? ALBUMIN 4.8 09/11/2020 1449  ? AST 26 03/30/2022 0335  ? ALT 17 03/30/2022 0335  ? ALKPHOS 147 (H) 03/30/2022 0335  ? BILITOT 0.9 03/30/2022 0335  ? BILITOT 0.5 09/11/2020 1449  ? GFRNONAA >60 04/01/2022 0421  ? GFRNONAA 55 (L)  04/29/2020 0826  ? GFRAA 76 09/11/2020 1449  ? GFRAA 63 04/29/2020 0826  ? ?Valproate and ammonia levels are both high at 124 and 71. ? ?Imaging ?I have reviewed images in epic and the results pertinent to this consultation are: ?CT head 4/18-no acute abnormality. ? ?Assessment: 66 year old man past history of ICH with residual left spastic hemiplegia and cognitive impairment, sleep apnea, presented to the emergency room for multiple episodes concerning for seizures.  Witnessed episode by primary team with focal onset with secondary generalization along with rightward gaze preference.  EEG confirmed the presence of seizures. ?Loaded with Depakote-EEG continued to show seizure activity.  Vimpat  added 03/30/22 ?EEG appeared better and continuous EEG monitoring was discontinued. ?No clinical or EEG seizures but more lethargic and drowsy -likely secondary to side effects of supratherapeutic levels of valproate and hyperammonemia, again from Depakote. ?We will discontinue Depakote. ? 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 ?Excessive somnolence in the setting of supratherapeutic valproate ? ?Recommendations: ?Had a detailed discussion with the wife and explained that Depakote might not be a good choice for antiepileptics given the supratherapeutic levels and hyperammonemia that is causing, which might be contributing to his hypersomnolence. ?They have tried Keppra before-also causes some somnolence. ?I would still try Keppra at a lower dose because it has a different mechanism of action than Vimpat and would be complementary and also does not interact with any medications. ?I am hesitant to add any medications with a sodium channel mechanism as it might cause cardiovascular side effects including bradycardia dizziness etc. ?Medications such as phenobarbital, with a different mechanism of action than Vimpat, could be considered but again it is an enzyme inducer and might interfere with other medications that he might need. ?At this time, I will give him a load of Keppra 500 mg IV and start him on Keppra 250 twice daily. ?We will see him again tomorrow. ?If he has any breakthrough seizures, will consider monitoring back on EEG. ?Medical management per primary team as you are ?I will continue to follow ?I spoke with the wife in detail over the phone ?I will meet with her when she comes to the hospital this later this morning/afternoon. ?Plan was relayed to Dr. Si Raider ? ?-- ?Amie Portland, MD ?Neurologist ?[facility] ?Pager: [phone] ? ?

## 2022-04-02 NOTE — Progress Notes (Addendum)
?PROGRESS NOTE ? ? ? 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  Was brought to the ED on account of multiple episodes of leg jerking, twitching followed by eyes rolling backwards and facial, jaw twitching episodes which last for 2 to 3 minutes. ?-Admitted with breakthrough seizures, seen by neurology, LTM was positive, started on Depakote ?-LTM noted additional seizures, was started on Vimpat 4/19 ?-Slowly improving ? ?Subjective: ?No complaints. Still more drowsy from baseline. Tolerating diet. No seizure-like activity last 24 hours ? ?Assessment and Plan: ? ?New onset seizure disorder ?Encephalopathy ?-In the setting of prior ICH, CT head with no acute findings, sequelae of prior ICH and atrophy noted ?-Neurology consulting, EEG confirmed seizure activity ?-Started on IV Depakote 4/18,  continued to have focal motor seizures and subclinical seizures after this, started on IV Vimpat 4/19 ?-CT head and other labs unremarkable ?-neurology stopping depakote today given supratherapeutic levels, hyperammonemia, somnolence. Will re-try keppra at lower dose, continue vimpat. Was on keppra previously for prevention, did not tolerate due to somnolence. ?  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 Pearlean Brownie and physiatry Dr. Hermelinda Medicus ?  ?Cognitive deficits, vascular Dementia (HCC) ?-Delirium precautions ? ?HTN ?Previously on amlodipine, not taking currently, but bp here persistently elevated. Advised resuming that today, wife refused, says patient is "very sensitive to that" ?  ?DVT prophylaxis: lovenox ?Code Status: Full code ?Family Communication: Discussed with wife at bedside ?Disposition Plan: Home in 1 to 2 days if neurological status  is stable ? ?Consultants:  ?Neurology ? ?Procedures: LTM ? ?Antimicrobials:  ? ? ?Objective: ?Vitals:  ? 04/01/22 2320 04/02/22 0407 04/02/22 0553 04/02/22 0820  ?BP: (!) 143/86 (!) 143/77  (!) 144/86  ?Pulse: 78 73  72  ?Resp: 15 15  19   ?Temp: 97.9 ?F (36.6 ?C) 98.1 ?F (36.7 ?C)  97.9 ?F (36.6 ?C)  ?TempSrc: Oral Oral  Oral  ?SpO2: 100% 100%  100%  ?Weight:   65.2 kg   ?Height:      ? ? ?Intake/Output Summary (Last 24 hours) at 04/02/2022 1000 ?Last data filed at 04/02/2022 0410 ?Gross per 24 hour  ?Intake 240.6 ml  ?Output 325 ml  ?Net -84.4 ml  ? ?Filed Weights  ? 03/29/22 0618 03/29/22 2100 04/02/22 0553  ?Weight: 68 kg 64.2 kg 65.2 kg  ? ? ?Examination: ? ?Chronically ill male appears much older than stated age, eyes closed, answers few questions in simple commands, oriented to self and partly to place ?HEENT pupils are reactive, left facial droop noted ?CVS: S1-S2, regular rate rhythm ?Lungs: Improved air movement, clear bilaterally ?Abdomen: Soft, nontender, bowel sounds present ?Extremities: No edema, spastic left hemiplegia ?Neuro: Poor insight and judgment, left hemiplegia, spasticity noted ?Psych: Flat affect ? ? ? ? ?Data Reviewed:  ? ?CBC: ?Recent Labs  ?Lab 03/27/22 ?1545 03/29/22 ?03/31/22 03/30/22 ?0708 04/01/22 ?0421 04/02/22 ?0151  ?WBC 3.8* 3.7* 8.2 5.1 4.9  ?NEUTROABS 1.2*  --   --   --   --   ?HGB 13.8 13.4 13.3 14.1 14.2  ?HCT 42.9 45.5 42.3 43.1 42.9  ?MCV 93.7 102.0* 96.1 91.7 91.9  ?PLT 254 102* 230 243 239  ? ?Basic Metabolic Panel: ?Recent Labs  ?Lab 03/27/22 ?1521 03/29/22 ?03/31/22 03/30/22 ?0335 03/30/22 ?0915 04/01/22 ?0421  ?NA  138 138 140 142 140  ?K 4.9 3.7 4.1 4.5 3.7  ?CL 104 110 108 108 107  ?CO2 27 21* 18* 19* 26  ?GLUCOSE 91 92 60* 69* 90  ?BUN 7* 11 13 16 8   ?CREATININE 1.03 1.12 1.20 1.11 0.86  ?CALCIUM 10.1 9.0 9.5 9.4 9.2  ?MG 2.1  --   --   --   --   ? ?GFR: ?Estimated Creatinine Clearance: 74.5 mL/min (by C-G formula based on SCr of 0.86 mg/dL). ?Liver Function Tests: ?Recent Labs   ?Lab 03/27/22 ?1521 03/30/22 ?0335  ?AST 20 26  ?ALT 11 17  ?ALKPHOS 139* 147*  ?BILITOT 0.6 0.9  ?PROT 7.0 6.9  ?ALBUMIN 4.2 3.9  ? ?No results for input(s): LIPASE, AMYLASE in the last 168 hours. ?Recent Labs  ?Lab 03/30/22 ?0915 04/02/22 ?0151  ?AMMONIA 25 71*  ? ?Coagulation Profile: ?No results for input(s): INR, PROTIME in the last 168 hours. ?Cardiac Enzymes: ?No results for input(s): CKTOTAL, CKMB, CKMBINDEX, TROPONINI in the last 168 hours. ?BNP (last 3 results) ?No results for input(s): PROBNP in the last 8760 hours. ?HbA1C: ?No results for input(s): HGBA1C in the last 72 hours. ?CBG: ?Recent Labs  ?Lab 04/01/22 ?2134  ?GLUCAP 151*  ? ?Lipid Profile: ?No results for input(s): CHOL, HDL, LDLCALC, TRIG, CHOLHDL, LDLDIRECT in the last 72 hours. ?Thyroid Function Tests: ?No results for input(s): TSH, T4TOTAL, FREET4, T3FREE, THYROIDAB in the last 72 hours. ?Anemia Panel: ?No results for input(s): VITAMINB12, FOLATE, FERRITIN, TIBC, IRON, RETICCTPCT in the last 72 hours. ?Urine analysis: ?   ?Component Value Date/Time  ? COLORURINE YELLOW 03/29/2022 1049  ? APPEARANCEUR HAZY (A) 03/29/2022 1049  ? APPEARANCEUR Clear 09/11/2020 1449  ? LABSPEC 1.024 03/29/2022 1049  ? PHURINE 6.0 03/29/2022 1049  ? GLUCOSEU NEGATIVE 03/29/2022 1049  ? HGBUR NEGATIVE 03/29/2022 1049  ? BILIRUBINUR NEGATIVE 03/29/2022 1049  ? BILIRUBINUR Negative 09/11/2020 1449  ? KETONESUR NEGATIVE 03/29/2022 1049  ? PROTEINUR NEGATIVE 03/29/2022 1049  ? UROBILINOGEN 0.2 04/15/2016 1108  ? NITRITE NEGATIVE 03/29/2022 1049  ? LEUKOCYTESUR NEGATIVE 03/29/2022 1049  ? ?Sepsis Labs: ?@LABRCNTIP (procalcitonin:4,lacticidven:4) ? ?)No results found for this or any previous visit (from the past 240 hour(s)).  ? ?Radiology Studies: ?No results found. ? ? ?Scheduled Meds: ? multivitamin with minerals  1 tablet Oral Daily  ? ?Continuous Infusions: ? lacosamide (VIMPAT) IV Stopped (04/01/22 2250)  ? levETIRAcetam    ? Followed by  ? levETIRAcetam    ? ? ?  LOS: 4 days  ? ? ?Time spent: ? ? ?04/03/22, MD ?[facility] ? ? ?04/02/2022, 10:00 AM  ?  ?

## 2022-04-02 NOTE — Plan of Care (Signed)
Depakote  level high ?Ammonia High ?DC Depakote ?Will discuss alternatives with wife ?Continue vimpat ?Will see him on rounds ? ?-- ?Milon Dikes, MD ?Neurologist ?[facility] ?Pager: [phone] ? ?

## 2022-04-03 DIAGNOSIS — Z8673 Personal history of transient ischemic attack (TIA), and cerebral infarction without residual deficits: Secondary | ICD-10-CM

## 2022-04-03 DIAGNOSIS — G4733 Obstructive sleep apnea (adult) (pediatric): Secondary | ICD-10-CM

## 2022-04-03 DIAGNOSIS — R7303 Prediabetes: Secondary | ICD-10-CM | POA: Diagnosis not present

## 2022-04-03 DIAGNOSIS — G8114 Spastic hemiplegia affecting left nondominant side: Secondary | ICD-10-CM

## 2022-04-03 DIAGNOSIS — R569 Unspecified convulsions: Secondary | ICD-10-CM | POA: Diagnosis not present

## 2022-04-03 DIAGNOSIS — F0390 Unspecified dementia without behavioral disturbance: Secondary | ICD-10-CM | POA: Diagnosis not present

## 2022-04-03 MED ORDER — LEVETIRACETAM 100 MG/ML PO SOLN
250.0000 mg | Freq: Two times a day (BID) | ORAL | Status: DC
  Administered 2022-04-03: 250 mg via ORAL
  Filled 2022-04-03: qty 5

## 2022-04-03 MED ORDER — LACOSAMIDE 10 MG/ML PO SOLN
100.0000 mg | Freq: Two times a day (BID) | ORAL | Status: DC
  Filled 2022-04-03: qty 10

## 2022-04-03 MED ORDER — LACOSAMIDE 50 MG PO TABS
100.0000 mg | ORAL_TABLET | Freq: Two times a day (BID) | ORAL | Status: DC
  Administered 2022-04-03 – 2022-04-07 (×10): 100 mg via ORAL
  Filled 2022-04-03 (×10): qty 2

## 2022-04-03 MED ORDER — LEVETIRACETAM 250 MG PO TABS
250.0000 mg | ORAL_TABLET | Freq: Two times a day (BID) | ORAL | Status: DC
  Administered 2022-04-03 – 2022-04-05 (×4): 250 mg via ORAL
  Filled 2022-04-03 (×4): qty 1

## 2022-04-03 NOTE — Progress Notes (Addendum)
Inpatient Rehab Admissions: ? ?Inpatient Rehab Consult received.  I met with patient and family at the bedside for rehabilitation assessment and to discuss goals and expectations of an inpatient rehab admission.  Spoke mainly with pt's son Merrily Pew. Discussed that insurance is unlikely to approve authorization for CIR given pt's current diagnosis of seizures. Family would still like to pursue CIR. Family acknowledged understanding of CIR goals and expectations. Family acknowledged pt's wife will be able to provide 24/7 support after discharge.  Called wife; left message and awaiting return call.  Will continue to follow. ? ? ? ?Signed: ?Gayland Curry, MS, CCC-SLP ?Admissions Coordinator ?[phone] ? ? ?

## 2022-04-03 NOTE — Hospital Course (Addendum)
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  Patient was then admitted hospital for breakthrough seizures.  Was seen by neurology.  Patient was then admitted to hospital for further evaluation and treatment.  LTM was positive and was started on Depakote and Vimpat which was subsequently changed to Keppra..   ? ?Assessment and Plan: ? Principal Problem: ?  Seizure (HCC) ?Active Problems: ?  OSA (obstructive sleep apnea) ?  Prediabetes ?  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  Dementia (HCC) ?  H/O: CVA (cerebrovascular accident) ?  ?New onset seizure disorder ?Encephalopathy ? ?History of intracranial hemorrhage.  CT head scan this time did not show any acute changes but atrophy.  Neurology was consulted.  EEG showed seizure-like activity.  Patient was started on Depakote 03/29/22 and added Vimpat on 4/19.  Depakote has been discontinued on 4/22 due to supratherapeutic levels, hyperammonemia and somnolence.  Keppra has been initiated.  Neurology has signed off at this time since patient is likely at his baseline.  We will continue Keppra 250 twice a day and Vimpat 100 mg twice daily as per neurology.   If patient gets breakthrough seizures we will need to monitor back on EEG.  Patient is alert awake and communicative and at his baseline mentation.. ? 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 Pearlean Brownie and physiatry Dr. Hermelinda Medicus as outpatient. ?  ?Cognitive deficits, vascular Dementia (HCC) ?-Continue delirium precautions.  Patient is oriented today. ?  ?HTN ?Was on amlodipine as outpatient.  Family reported that he was very sensitive.  Has been started on low-dose while in the hospital  at 2.5 mg daily.  Latest blood pressure at 20/95 ? ?History of sleep apnea.   ? ?Deconditioning/debility.  Patient has been seen by physical therapy and therapy.  recommend inpatient rehab at this time.  Family also wishes to CIR. ?

## 2022-04-03 NOTE — Progress Notes (Signed)
Neurology Progress Note ? ? ?S:// ?Laying in bed in NAD. Responds easily to voice. Will not open eyes. No family at bedside  ? ? ?O:// ?Current vital signs: ?BP (!) 162/67 (BP Location: Right Leg)   Pulse (!) 58   Temp 98.7 ?F (37.1 ?C) (Oral)   Resp 14   Ht 5\' 5"  (1.651 m)   Wt 65.2 kg   SpO2 100%   BMI 23.92 kg/m?  ?Vital signs in last 24 hours: ?Temp:  [96.8 ?F (36 ?C)-98.7 ?F (37.1 ?C)] 98.7 ?F (37.1 ?C) (04/23 06-29-1980) ?Pulse Rate:  [58-81] 58 (04/23 0803) ?Resp:  [14-20] 14 (04/23 0803) ?BP: (114-162)/(67-80) 162/67 (04/23 0803) ?SpO2:  [98 %-100 %] 100 % (04/23 0803) ? ?General: Comfortably sleeping in bed, responds with single word answers ?HEENT: Normocephalic atraumatic ?Lungs clear ?Cardiovascular-regular rate rhythm ? ?Neurological exam ?Drowsy, does not open eyes to voice but responds to questions. ?Oriented x1 ?No gross dysarthria ?Cranial nerves: unable to assess pupils of EOM today.  Mild left facial droop. ?Motor exam with left spastic hemiplegia.  Right side with 4+/5 strength ?Sensation intact ?Coordination intact on the right ? ?Medications ? ?Current Facility-Administered Medications:  ?  acetaminophen (TYLENOL) tablet 650 mg, 650 mg, Oral, Q6H PRN **OR** acetaminophen (TYLENOL) suppository 650 mg, 650 mg, Rectal, Q6H PRN, 06-29-1980, MD ?  amLODipine (NORVASC) tablet 2.5 mg, 2.5 mg, Oral, Daily, Wouk, Zannie Cove, MD ?  lacosamide (VIMPAT) oral solution 100 mg, 100 mg, Oral, BID, Wilfred Curtis A, NP ?  levETIRAcetam (KEPPRA) 100 MG/ML solution 250 mg, 250 mg, Oral, BID, Gevena Mart A, NP ?  multivitamin with minerals tablet 1 tablet, 1 tablet, Oral, Daily, Gevena Mart, MD, 1 tablet at 04/02/22 1043 ?  ondansetron (ZOFRAN) tablet 4 mg, 4 mg, Oral, Q6H PRN **OR** ondansetron (ZOFRAN) injection 4 mg, 4 mg, Intravenous, Q6H PRN, 04/04/22, MD ?  polyethylene glycol (MIRALAX / GLYCOLAX) packet 17 g, 17 g, Oral, Daily, Wouk, Zannie Cove, MD ?  senna-docusate (Senokot-S) tablet  1 tablet, 1 tablet, Oral, QHS PRN, Wilfred Curtis, MD ? ?Valproate and ammonia levels are both high at 124 and 71 on 4/22 ? ?Imaging ?I have reviewed images in epic and the results pertinent to this consultation are: ?CT head 4/18-no acute abnormality. ? ?Assessment: 66 year old man past history of ICH with residual left spastic hemiplegia and cognitive impairment, sleep apnea, presented to the emergency room for multiple episodes concerning for seizures.  Witnessed episode by primary team with focal onset with secondary generalization along with rightward gaze preference.  EEG confirmed the presence of seizures. ?Loaded with Depakote-EEG continued to show seizure activity.  Vimpat added 03/30/22. EEG appeared better and continuous EEG monitoring was discontinued. ?Continue to be more drowsy-likely secondary to Depakote which was discontinued. ?Wife is reluctant to start Keppra but after discussion with the review of started Keppra low-dose to 50 twice daily. ? 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 ?Excessive somnolence in the setting of supratherapeutic valproate-discontinued and currently on Keppra and Vimpat. ? ?Recommendations: ?He seems to be at baseline today even with the change in AEDs so will continue Vimpat and Keppra.  Keeps his eyes closed but follows commands and is able to have conversations.  Has significant degree of underlying cognitive deficits which are likely baseline. ?Continue 250 twice daily and Vimpat 100mg  BID. Will change to PO today ?If he has any breakthrough seizures, will consider monitoring back on EEG. ?  Neurology will sign off. Please call for any questions or concerns ? ?Gevena Mart DNP, ACNPC-AG  ? ?Attending Neurohospitalist Addendum ?Patient seen and examined with APP/Resident. ?Agree with the history and physical as documented above. ?Agree with the plan as documented, which I helped formulate. ?I  have independently reviewed the chart, obtained history, review of systems and examined the patient.I have personally reviewed pertinent head/neck/spine imaging (CT/MRI).  Plan relayed to the primary hospitalist via secure chat. ?Please feel free to call with any questions. ? ?-- ?Milon Dikes, MD ?Neurologist ?[facility] ?Pager: [phone] ? ? ?

## 2022-04-03 NOTE — Progress Notes (Signed)
?PROGRESS NOTE ? ? ? ?Donald [name]  IRS:[id number] DOB: [birth year] DOA: 03/29/2022 ?PCP: [name], MD  ? ? 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  Patient was then admitted hospital for breakthrough seizures.  Was seen by neurology.  Patient was then admitted to hospital for further evaluation and treatment.  LTM was positive and was started on Depakote and Vimpat.   ? ?Assessment and Plan: ?  ?New onset seizure disorder ?Encephalopathy ? ?History of intracranial hemorrhage.  CT head scan this time did not show any acute changes but atrophy.  Neurology was consulted.  EEG showed seizure-like activity.  Patient was started on Depakote 4/18 and added Vimpat on 4/19.  Depakote has been discontinued on 4/22 due to supratherapeutic levels hyper ammonemia and somnolence.  Keppra has been initiated.  Neurology followed the patient again today and at this time patient is following commands.  Neurology likely at his baseline.  We will continue Keppra 250 twice a day and Vimpat 100 mg twice daily as per neurology.  Neurology has signed off at this time.  If patient gets breakthrough seizures we will need to monitor back on EEG ? 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 Pearlean Brownie and physiatry Dr. Hermelinda Medicus as outpatient. ?  ?Cognitive deficits, vascular Dementia (HCC) ?-Continue delirium precautions ?  ?HTN ?Was on amlodipine as outpatient.  Family reported that he was very sensitive.  Has been started on low-dose while in the hospital at 2.5 mg daily.  Patient blood pressure 141/74. ? ?Deconditioning/debility.  Patient has been seen by physical therapy and therapy.   recommend inpatient rehab at this time  ? ? DVT prophylaxis:  ?  None ? ?Code Status:   ?  Code Status: Full Code ? ?Disposition: CIR as per PT, OT.  We will consult the rehab. ? ?Status is: Inpatient ? ?Remains inpatient appropriate because: Intractable seizures, adjusting medication, neurology follow-up , Need for CIR. ? ? Family Communication: None today. ? ?Consultants:  ?Neurology ? ?Procedures:  ?Long-term EEG monitoring ? ?Antimicrobials:  ?None ? ?Anti-infectives (From admission, onward)  ? ? None  ? ?  ? ?Subjective: ?Today, patient was seen and examined at bedside.  Patient closing eyes but communicated.  Answering appropriately.  Oriented to place.  Baseline as per neurology. ? ?Objective: ?Vitals:  ? 04/02/22 2035 04/02/22 2357 04/03/22 0324 04/03/22 0803  ?BP: 129/71 115/78 (!) 141/74 (!) 162/67  ?Pulse: 64 73 68 (!) 58  ?Resp: 18 16 14 14   ?Temp: 97.7 ?F (36.5 ?C) 97.9 ?F (36.6 ?C) 97.7 ?F (36.5 ?C) 98.7 ?F (37.1 ?C)  ?TempSrc: Axillary  Oral Oral  ?SpO2: 100% 100% 100% 100%  ?Weight:      ?Height:      ? ? ?Intake/Output Summary (Last 24 hours) at 04/03/2022 1050 ?Last data filed at 04/03/2022 0346 ?Gross per 24 hour  ?Intake --  ?Output 1050 ml  ?Net -1050 ml  ? ?Filed Weights  ? 03/29/22 0618 03/29/22 2100 04/02/22 0553  ?Weight: 68 kg 64.2 kg 65.2 kg  ? ? ?Physical Examination: ?General:  Average built, not in obvious distress, chronically ill male, older than his stated age, alert awake and communicative, opening eyes, ?  HENT:   No scleral pallor or icterus noted. Oral mucosa is moist.  ?Chest:  Clear breath sounds.  Diminished breath sounds bilaterally. No crackles or wheezes.  ?CVS: S1 &S2 heard. No murmur.  Regular rate and rhythm. ?Abdomen: Soft, nontender, nondistended.  Bowel sounds are heard.   ?Extremities: No cyanosis, clubbing or edema.  Peripheral pulses are palpable. ?Psych: Poor insight and judgment, flat affect oriented to self.  Communicative ?CNS: Left facial droop, spastic left  hemiplegia, right-sided mild weakness. ?Skin: Warm and dry.  No rashes noted. ? ?Data Reviewed:  ? ?CBC: ?Recent Labs  ?Lab 03/27/22 ?1545 03/29/22 ?2919 03/30/22 ?0708 04/01/22 ?0421 04/02/22 ?0151  ?WBC 3.8* 3.7* 8.2 5.1 4.9  ?NEUTROABS 1.2*  --   --   --   --   ?HGB 13.8 13.4 13.3 14.1 14.2  ?HCT 42.9 45.5 42.3 43.1 42.9  ?MCV 93.7 102.0* 96.1 91.7 91.9  ?PLT 254 102* 230 243 239  ? ? ?Basic Metabolic Panel: ?Recent Labs  ?Lab 03/27/22 ?1521 03/29/22 ?1660 03/30/22 ?0335 03/30/22 ?0915 04/01/22 ?0421  ?NA 138 138 140 142 140  ?K 4.9 3.7 4.1 4.5 3.7  ?CL 104 110 108 108 107  ?CO2 27 21* 18* 19* 26  ?GLUCOSE 91 92 60* 69* 90  ?BUN 7* 11 13 16 8   ?CREATININE 1.03 1.12 1.20 1.11 0.86  ?CALCIUM 10.1 9.0 9.5 9.4 9.2  ?MG 2.1  --   --   --   --   ? ? ?Liver Function Tests: ?Recent Labs  ?Lab 03/27/22 ?1521 03/30/22 ?0335  ?AST 20 26  ?ALT 11 17  ?ALKPHOS 139* 147*  ?BILITOT 0.6 0.9  ?PROT 7.0 6.9  ?ALBUMIN 4.2 3.9  ? ? ? ?Radiology Studies: ?No results found. ? ? ? LOS: 5 days  ? ? ?04/01/22, MD ?[facility] ?Available via Epic secure chat 7am-7pm ?After these hours, please refer to coverage provider listed on [email or web address] ?04/03/2022, 10:50 AM  ? ? ?

## 2022-04-04 DIAGNOSIS — R7303 Prediabetes: Secondary | ICD-10-CM | POA: Diagnosis not present

## 2022-04-04 DIAGNOSIS — G4733 Obstructive sleep apnea (adult) (pediatric): Secondary | ICD-10-CM | POA: Diagnosis not present

## 2022-04-04 DIAGNOSIS — F0390 Unspecified dementia without behavioral disturbance: Secondary | ICD-10-CM | POA: Diagnosis not present

## 2022-04-04 DIAGNOSIS — Z8673 Personal history of transient ischemic attack (TIA), and cerebral infarction without residual deficits: Secondary | ICD-10-CM | POA: Diagnosis not present

## 2022-04-04 LAB — CBC
HCT: 45.8 % (ref 39.0–52.0)
Hemoglobin: 14.7 g/dL (ref 13.0–17.0)
MCH: 30.2 pg (ref 26.0–34.0)
MCHC: 32.1 g/dL (ref 30.0–36.0)
MCV: 94.2 fL (ref 80.0–100.0)
Platelets: 211 10*3/uL (ref 150–400)
RBC: 4.86 MIL/uL (ref 4.22–5.81)
RDW: 12.9 % (ref 11.5–15.5)
WBC: 4.7 10*3/uL (ref 4.0–10.5)
nRBC: 0 % (ref 0.0–0.2)

## 2022-04-04 LAB — BASIC METABOLIC PANEL
Anion gap: 7 (ref 5–15)
BUN: 12 mg/dL (ref 8–23)
CO2: 25 mmol/L (ref 22–32)
Calcium: 9.3 mg/dL (ref 8.9–10.3)
Chloride: 107 mmol/L (ref 98–111)
Creatinine, Ser: 0.99 mg/dL (ref 0.61–1.24)
GFR, Estimated: >60 mL/min (ref >60)
Glucose, Bld: 92 mg/dL (ref 70–99)
Potassium: 4.1 mmol/L (ref 3.5–5.1)
Sodium: 139 mmol/L (ref 135–145)

## 2022-04-04 LAB — MAGNESIUM: Magnesium: 2.1 mg/dL (ref 1.7–2.4)

## 2022-04-04 NOTE — Progress Notes (Addendum)
?PROGRESS NOTE ? ? ? ?Donald [name]  VOH:[id number] DOB: 1956-10-18 DOA: 03/29/2022 ?PCP: [name], MD  ? ? 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  Patient was then admitted hospital for breakthrough seizures.  Was seen by neurology.  Patient was then admitted to hospital for further evaluation and treatment.  LTM was positive and was started on Depakote and Vimpat which was subsequently changed to Keppra..   ? ?Assessment and Plan: ? Principal Problem: ?  Seizure (HCC) ?Active Problems: ?  OSA (obstructive sleep apnea) ?  Prediabetes ?  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  Dementia (HCC) ?  H/O: CVA (cerebrovascular accident) ?  ?New onset seizure disorder ?Encephalopathy ? ?History of intracranial hemorrhage.  CT head scan this time did not show any acute changes but atrophy.  Neurology was consulted.  EEG showed seizure-like activity.  Patient was started on Depakote 4/18 and added Vimpat on 4/19.  Depakote has been discontinued on 4/22 due to supratherapeutic levels, hyperammonemia and somnolence.  Keppra has been initiated.  Patient is more alert awake and communicative.  Neurology has signed off at this time since patient is likely at his baseline.  We will continue Keppra 250 twice a day and Vimpat 100 mg twice daily as per neurology.   If patient gets breakthrough seizures we will need to monitor back on EEG ? 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 Pearlean Brownie and physiatry Dr. Hermelinda Medicus as outpatient. ?  ?Cognitive deficits, vascular Dementia (HCC) ?-Continue delirium precautions ?  ?HTN ?Was on amlodipine as outpatient.  Family reported  that he was very sensitive.  Has been started on low-dose while in the hospital at 2.5 mg daily.  Latest blood pressure at 140/87 ? ?History of sleep apnea.   ? ?Deconditioning/debility.  Patient has been seen by physical therapy and therapy.  recommend inpatient rehab at this time.  Family also wishes to CIR.  ? ? DVT prophylaxis:  ?  None ? ?Code Status:   ?  Code Status: Full Code ? ?Disposition: CIR as per PT, OT.  Rehab has been consulted. ? ?Status is: Inpatient ? ?Remains inpatient appropriate because: Intractable seizures, Need for CIR. ? ? Family Communication:  ?Spoke with the patient's wife on the phone and updated her about the clinical condition of the patient. ? ?Consultants:  ?Neurology ? ?Procedures:  ?Long-term EEG monitoring ? ?Antimicrobials:  ?None ? ?Anti-infectives (From admission, onward)  ? ? None  ? ?  ? ?Subjective: ?Today, patient was seen and examined at bedside.  Patient states that he he slept okay.  Denies any pain nausea vomiting.  States that he is in the hospital. ? ?Objective: ?Vitals:  ? 04/03/22 1953 04/04/22 0004 04/04/22 0350 04/04/22 0841  ?BP: 126/79 122/89 (!) 146/96 140/87  ?Pulse: 73 76 81 73  ?Resp: 16 18 20 18   ?Temp: 98.5 ?F (36.9 ?C) 98.5 ?F (36.9 ?C) 98.6 ?F (37 ?C) 98.7 ?F (37.1 ?C)  ?TempSrc: Oral Oral Oral Oral  ?SpO2: 100% 100% 96% 100%  ?Weight:      ?Height:      ? ? ?Intake/Output Summary (Last 24 hours) at 04/04/2022 0854 ?Last data filed at  04/03/2022 1849 ?Gross per 24 hour  ?Intake 450 ml  ?Output 350 ml  ?Net 100 ml  ? ?Filed Weights  ? 03/29/22 0618 03/29/22 2100 04/02/22 0553  ?Weight: 68 kg 64.2 kg 65.2 kg  ? ? ?Physical Examination: ? ?General:  Average built, not in obvious distress, alert awake and communicative, chronically ill, older than his stated age, opening eyes ?HENT:   No scleral pallor or icterus noted. Oral mucosa is moist.  ?Chest:  Clear breath sounds.  Diminished breath sounds bilaterally. No crackles or wheezes.  ?CVS: S1 &S2 heard. No  murmur.  Regular rate and rhythm. ?Abdomen: Soft, nontender, nondistended.  Bowel sounds are heard.   ?Extremities: No cyanosis, clubbing or edema.  Peripheral pulses are palpable. ?Psych: Alert, awake and oriented to self and hospital, flat affect, ?CNS: Left facial droop, spastic left hemiplegia, right-sided mild weakness. ?Skin: Warm and dry.  No rashes noted. ? ?Data Reviewed:  ? ?CBC: ?Recent Labs  ?Lab 03/29/22 ?2585 03/30/22 ?0708 04/01/22 ?0421 04/02/22 ?0151 04/04/22 ?2778  ?WBC 3.7* 8.2 5.1 4.9 4.7  ?HGB 13.4 13.3 14.1 14.2 14.7  ?HCT 45.5 42.3 43.1 42.9 45.8  ?MCV 102.0* 96.1 91.7 91.9 94.2  ?PLT 102* 230 243 239 211  ? ? ?Basic Metabolic Panel: ?Recent Labs  ?Lab 03/29/22 ?2423 03/30/22 ?0335 03/30/22 ?0915 04/01/22 ?0421 04/04/22 ?5361  ?NA 138 140 142 140 139  ?K 3.7 4.1 4.5 3.7 4.1  ?CL 110 108 108 107 107  ?CO2 21* 18* 19* 26 25  ?GLUCOSE 92 60* 69* 90 92  ?BUN 11 13 16 8 12   ?CREATININE 1.12 1.20 1.11 0.86 0.99  ?CALCIUM 9.0 9.5 9.4 9.2 9.3  ?MG  --   --   --   --  2.1  ? ? ?Liver Function Tests: ?Recent Labs  ?Lab 03/30/22 ?0335  ?AST 26  ?ALT 17  ?ALKPHOS 147*  ?BILITOT 0.9  ?PROT 6.9  ?ALBUMIN 3.9  ? ? ? ?Radiology Studies: ?No results found. ? ? ? LOS: 6 days  ? ? ?04/01/22, MD ?[facility] ?Available via Epic secure chat 7am-7pm ?After these hours, please refer to coverage provider listed on [email or web address] ?04/04/2022, 8:54 AM  ? ? ?

## 2022-04-04 NOTE — Plan of Care (Signed)
?  Problem: Safety: ?Goal: Non-violent Restraint(s) ?Outcome: Progressing ?  ?Problem: Coping: ?Goal: Ability to adjust to condition or change in health will improve ?Outcome: Progressing ?Goal: Ability to identify appropriate support needs will improve ?Outcome: Progressing ?  ?Problem: Medication: ?Goal: Risk for medication side effects will decrease ?Outcome: Progressing ?  ?Problem: Self-Concept: ?Goal: Level of anxiety will decrease ?Outcome: Progressing ?Goal: Ability to verbalize feelings about condition will improve ?Outcome: Progressing ?  ?

## 2022-04-04 NOTE — Progress Notes (Signed)
Physical Therapy Treatment ?Patient Details ?Name: Donald [name] ?MRN: [id number] ?DOB: Apr 05, 1956 ?Today's Date: 04/04/2022 ? ? ?History of Present Illness Pt is a 66yo male who was brought to ED for seizure like activity and was found to have had seizures. 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 ?  ?PT Comments  ? ? Pt progressing towards goals. Pt remains to require total assist for transfers and ADLs at this time. Pt continue to benefit from AIR upon d/c as pt was assistx1 with use of stedy at home with spouse PTA. Pt did tolerate transfer to recliner today and maintain good position with LEs down and back upright (not reclined). Acute PT to cont to follow.   ?  ?Recommendations for follow up therapy are one component of a multi-disciplinary discharge planning process, led by the attending physician.  Recommendations may be updated based on patient status, additional functional criteria and insurance authorization. ? ?Follow Up Recommendations ? Acute inpatient rehab (3hours/day) ?  ?  ?Assistance Recommended at Discharge Frequent or constant Supervision/Assistance  ?Patient can return home with the following A lot of help with walking and/or transfers;A lot of help with bathing/dressing/bathroom;Direct supervision/assist for medications management;Direct supervision/assist for financial management;Assist for transportation;Help with stairs or ramp for entrance;Assistance with feeding;Assistance with cooking/housework ?  ?Equipment Recommendations ? None recommended by PT  ?  ?Recommendations for Other Services Rehab consult ? ? ?  ?Precautions / Restrictions Precautions ?Precautions: Fall ?Precaution Comments: seizure ?Required Braces or Orthoses: Splint/Cast ?Splint/Cast: pt's wife reports pt has two different day-time splints for his L hand/wrist (asked wife to bring them acutely) ?Restrictions ?Weight Bearing Restrictions: No  ?  ? ?Mobility ? Bed  Mobility ?Overal bed mobility: Needs Assistance ?Bed Mobility: Supine to Sit ?  ?  ?Supine to sit: Total assist, +2 for physical assistance, +2 for safety/equipment ?  ?  ?General bed mobility comments: totalAX2, at LEs and trunk elevation, pt unable to assist at all ?  ? ?Transfers ?Overall transfer level: Needs assistance ?Equipment used: Ambulation equipment used ?Transfers: Sit to/from Stand, Bed to chair/wheelchair/BSC ?Sit to Stand: Total assist, +2 physical assistance, +2 safety/equipment ?  ?  ?  ?  ?  ?General transfer comment: pt requiring maxA to bring up into stedy, dependent to transfer to recliner, pt with improved effort when standing from stedy ?Transfer via Lift Equipment: Stedy ? ?Ambulation/Gait ?  ?  ?  ?  ?  ?  ?  ?General Gait Details: pt non-ambulatory ? ? ?Stairs ?  ?  ?  ?  ?  ? ? ?Wheelchair Mobility ?  ? ?Modified Rankin (Stroke Patients Only) ?  ? ? ?  ?Balance Overall balance assessment: Needs assistance ?Sitting-balance support: Feet supported, Single extremity supported ?Sitting balance-Leahy Scale: Poor ?Sitting balance - Comments: maxA posteriorly to prevent falling ?  ?  ?  ?Standing balance comment: dependent on stedy ?  ?  ?  ?  ?  ?  ?  ?  ?  ?  ?  ?  ? ?  ?Cognition Arousal/Alertness: Lethargic (however keeps his eyes closed (this is baseline)) ?Behavior During Therapy: Flat affect ?Overall Cognitive Status: Impaired/Different from baseline ?Area of Impairment: Orientation, Attention, Memory, Following commands, Safety/judgement, Awareness, Problem solving ?  ?  ?  ?  ?  ?  ?  ?  ?Orientation Level: Place, Time, Situation ?Current Attention Level: Focused ?  ?Following Commands: Follows one step commands with increased time ?  ?  Awareness: Intellectual ?Problem Solving: Slow processing, Decreased initiation, Requires verbal cues, Requires tactile cues ?General Comments: pt more lethargic today, less engaged in conversation ?  ?  ? ?  ?Exercises Other Exercises ?Other Exercises:  attempted to complete passive ROM to L UE and LE however pt grimacing and groaning in discomfort ? ?  ?General Comments General comments (skin integrity, edema, etc.): VSS ?  ?  ? ?Pertinent Vitals/Pain Pain Assessment ?Pain Assessment: Faces ?Faces Pain Scale: Hurts little more ?Pain Location: grimacing with mobility ?Pain Descriptors / Indicators: Discomfort, Guarding, Grimacing  ? ? ?Home Living   ?  ?  ?  ?  ?  ?  ?  ?  ?  ?   ?  ?Prior Function    ?  ?  ?   ? ?PT Goals (current goals can now be found in the care plan section) Acute Rehab PT Goals ?PT Goal Formulation: With patient/family ?Time For Goal Achievement: 04/16/22 ?Potential to Achieve Goals: Good ?Progress towards PT goals: Progressing toward goals ? ?  ?Frequency ? ? ? Min 3X/week ? ? ? ?  ?PT Plan Current plan remains appropriate  ? ? ?Co-evaluation   ?  ?  ?  ?  ? ?  ?AM-PAC PT "6 Clicks" Mobility   ?Outcome Measure ? Help needed turning from your back to your side while in a flat bed without using bedrails?: Total ?Help needed moving from lying on your back to sitting on the side of a flat bed without using bedrails?: Total ?Help needed moving to and from a bed to a chair (including a wheelchair)?: Total ?Help needed standing up from a chair using your arms (e.g., wheelchair or bedside chair)?: Total ?Help needed to walk in hospital room?: Total ?Help needed climbing 3-5 steps with a railing? : Total ?6 Click Score: 6 ? ?  ?End of Session Equipment Utilized During Treatment: Gait belt ?Activity Tolerance: Patient limited by lethargy ?Patient left: with call bell/phone within reach;in chair;with chair alarm set;with nursing/sitter in room ?Nurse Communication: Mobility status ?PT Visit Diagnosis: Muscle weakness (generalized) (M62.81) ?  ? ? ?Time: 9528-4132 ?PT Time Calculation (min) (ACUTE ONLY): 32 min ? ?Charges:  $Therapeutic Activity: 23-37 mins          ?          ? ?Lewis Shock, PT, DPT ?Acute Rehabilitation Services ?Secure chat  preferred ?Office #: [phone] ? ? ? ?Donald [name] ?04/04/2022, 2:08 PM ? ?

## 2022-04-05 DIAGNOSIS — R569 Unspecified convulsions: Secondary | ICD-10-CM | POA: Diagnosis not present

## 2022-04-05 DIAGNOSIS — G4733 Obstructive sleep apnea (adult) (pediatric): Secondary | ICD-10-CM | POA: Diagnosis not present

## 2022-04-05 DIAGNOSIS — Z8673 Personal history of transient ischemic attack (TIA), and cerebral infarction without residual deficits: Secondary | ICD-10-CM | POA: Diagnosis not present

## 2022-04-05 DIAGNOSIS — F0390 Unspecified dementia without behavioral disturbance: Secondary | ICD-10-CM | POA: Diagnosis not present

## 2022-04-05 LAB — CBC
HCT: 42.7 % (ref 39.0–52.0)
Hemoglobin: 13.8 g/dL (ref 13.0–17.0)
MCH: 30.4 pg (ref 26.0–34.0)
MCHC: 32.3 g/dL (ref 30.0–36.0)
MCV: 94.1 fL (ref 80.0–100.0)
Platelets: 220 10*3/uL (ref 150–400)
RBC: 4.54 MIL/uL (ref 4.22–5.81)
RDW: 13.1 % (ref 11.5–15.5)
WBC: 4.3 10*3/uL (ref 4.0–10.5)
nRBC: 0 % (ref 0.0–0.2)

## 2022-04-05 MED ORDER — LEVETIRACETAM 100 MG/ML PO SOLN
250.0000 mg | Freq: Two times a day (BID) | ORAL | Status: DC
  Administered 2022-04-05 – 2022-04-07 (×5): 250 mg via ORAL
  Filled 2022-04-05 (×5): qty 5

## 2022-04-05 NOTE — Progress Notes (Signed)
Inpatient Rehab Admissions Coordinator:  ?Spoke with wife on the phone. Informed her that insurance authorization has started. Will continue to follow. ? ?Gayland Curry, MS, CCC-SLP ?Admissions Coordinator ?[phone] ? ?

## 2022-04-05 NOTE — PMR Pre-admission (Shared)
PMR Admission Coordinator Pre-Admission Assessment ? ?Patient: Donald [name] is an 66 y.o., male ?MRN: [id number] ?DOB: 1956/11/12 ?Height: _0  (165.1 cm) ?Weight: 65.2 kg ? ?Insurance Information ?HMO:     PPO: yes     PCP:      IPA:      80/20:      OTHER:  ?PRIMARY: [place]      Policy#: [id number] geha      Subscriber: Donald [name] ?CM Name: ***      Phone#: ***     Fax#: [phone] ?Pre-Cert#: [id number]      Employer: *** ?Benefits:  Phone #: [phone]     Name:  ?Eff. Date: 07/11/15-still active     Deduct: 4350 ($0 met)      Out of Pocket Max: $6,500 ($0 met)      Life Max: NA ?CIR: 85% coverage, 15%co-insurance      SNF: 85% coverage,15% co-insurance, limited to 50 days/cal year ?Outpatient: 85% coverage, limited to 60 visits/cal year     Co-Pay: 15% co-insurance ?Home Health: 85% coverage      Co-Pay: 15% co-insurance, limited to 50 days/cal year ?DME: 85% coverage     Co-Pay: 15% co-insurance ?Providers: in-network ?SECONDARY:       Policy#:      Phone#:  ? ?Financial Counselor:       Phone#:  ? ?The ?Data Collection Information Summary? for patients in Inpatient Rehabilitation Facilities with attached ?Privacy Act Short Records? was provided and verbally reviewed with: N/A ? ?Emergency Contact Information ?Contact Information   ? ? Name Relation Home Work Mobile  ? Donald, [name] Spouse [phone]  [phone]  ? Donald, [name] Daughter   [phone]  ? ?  ? ? ?Current Medical History  ?Patient Admitting Diagnosis: seizures ?History of Present Illness: *** ?  ? ?Patient's medical record from [facility] has been reviewed by the rehabilitation admission coordinator and physician. ? ?Past Medical History  ?Past Medical History:  ?Diagnosis Date  ? Arthritis   ? Stroke [facility])   ? ? ?Has the patient had major surgery during 100 days prior to admission? Yes ? ?Family History   ?family history includes Hypertension in his mother. ? ?Current Medications ? ?Current  Facility-Administered Medications:  ?  acetaminophen (TYLENOL) tablet 650 mg, 650 mg, Oral, Q6H PRN **OR** acetaminophen (TYLENOL) suppository 650 mg, 650 mg, Rectal, Q6H PRN, Domenic Polite, MD ?  amLODipine (NORVASC) tablet 2.5 mg, 2.5 mg, Oral, Daily, Wouk, Ailene Rud, MD, 2.5 mg at 04/04/22 1029 ?  lacosamide (VIMPAT) tablet 100 mg, 100 mg, Oral, BID, Amie Portland, MD, 100 mg at 04/04/22 2251 ?  levETIRAcetam (KEPPRA) tablet 250 mg, 250 mg, Oral, BID, Pokhrel, Laxman, MD, 250 mg at 04/04/22 2251 ?  multivitamin with minerals tablet 1 tablet, 1 tablet, Oral, Daily, Domenic Polite, MD, 1 tablet at 04/04/22 1029 ?  ondansetron (ZOFRAN) tablet 4 mg, 4 mg, Oral, Q6H PRN **OR** ondansetron (ZOFRAN) injection 4 mg, 4 mg, Intravenous, Q6H PRN, Domenic Polite, MD ?  polyethylene glycol (MIRALAX / GLYCOLAX) packet 17 g, 17 g, Oral, Daily, Wouk, Ailene Rud, MD, 17 g at 04/04/22 1029 ?  senna-docusate (Senokot-S) tablet 1 tablet, 1 tablet, Oral, QHS PRN, Domenic Polite, MD ? ?Patients Current Diet:  ?Diet Order   ? ?       ?  Diet regular Room service appropriate? Yes; Fluid consistency: Thin  Diet effective now       ?  ? ?  ?  ? ?  ? ? ?  Precautions / Restrictions ?Precautions ?Precautions: Fall ?Precaution Comments: seizure ?Restrictions ?Weight Bearing Restrictions: No  ? ?Has the patient had 2 or more falls or a fall with injury in the past year? No ? ?Prior Activity Level ?Limited Community (1-2x/wk): out of house for MD appointments and walks ? ?Prior Functional Level ?Self Care: Did the patient need help bathing, dressing, using the toilet or eating? Needed some help ? ?Indoor Mobility: Did the patient need assistance with walking from room to room (with or without device)? Dependent ? ?Stairs: Did the patient need assistance with internal or external stairs (with or without device)? Dependent ? ?Functional Cognition: Did the patient need help planning regular tasks such as shopping or remembering to take  medications? Dependent ? ?Patient Information ?  ? ?Patient's Response To:  ?  ? ?Home Assistive Devices / Equipment ?Home Assistive Devices/Equipment: None ?Home Equipment: Shower seat - built in, [employer] - toilet, Grab bars - tub/shower, Wheelchair - manual, Other (comment) (steady) ? ?Prior Device Use: Indicate devices/aids used by the patient prior to current illness, exacerbation or injury? Manual wheelchair and Stedy ? ?Current Functional Level ?Cognition ? Overall Cognitive Status: Impaired/Different from baseline ?Current Attention Level: Focused ?Orientation Level: Oriented to person, Disoriented to time, Disoriented to situation, Disoriented to place ?Following Commands: Follows one step commands with increased time ?Safety/Judgement: Decreased awareness of safety, Decreased awareness of deficits ?General Comments: alert and communicative, disoriented and demonstrated STM deficits Follows <10% of commands ?   ?Extremity Assessment ?(includes Sensation/Coordination) ? Upper Extremity Assessment: RUE deficits/detail, LUE deficits/detail ?RUE Deficits / Details: difficult to assess - pt not following commands. uses to wash face, no attempt at self feeding despite cues ?LUE Deficits / Details: L spastic hemiplegia  ?Lower Extremity Assessment: Defer to PT evaluation ?LLE Deficits / Details: L weakness/spastic hemiplegia, knee contracture  ?  ?ADLs ? Overall ADL's : Needs assistance/impaired ?Eating/Feeding: Maximal assistance, Sitting ?Eating/Feeding Details (indicate cue type and reason): max cues and assist to self feed with RUE ?Grooming: Moderate assistance, Sitting ?Grooming Details (indicate cue type and reason): able to wash face, needs mod cues and assist to reach entire head ?Toilet Transfer: Total assistance, Squat-pivot ?Toilet Transfer Details (indicate cue type and reason): simulated. attempted use of steady, pt actively resisting with RUE. total A squat pivot from bed>chair ?Functional mobility  during ADLs: Total assistance ?General ADL Comments: impiared cognition, difficulty following commands, L spastic hemi  ?  ?Mobility ? Overal bed mobility: Needs Assistance ?Bed Mobility: Supine to Sit ?Supine to sit: Total assist ?Sit to supine: Total assist, +2 for physical assistance, +2 for safety/equipment ?General bed mobility comments: once sitting pt able to sustain sitting balance with close min G and RUE supported  ?  ?Transfers ? Overall transfer level: Needs assistance ?Equipment used: Ambulation equipment used ?Transfers: Sit to/from Stand, Bed to chair/wheelchair/BSC ?Sit to Stand: Total assist ?Bed to/from chair/wheelchair/BSC transfer type:: Squat pivot ?Squat pivot transfers: Total assist ?Transfer via Lift Equipment: Stedy ?General transfer comment: attempted use of steady, pt actively resisting. ultimately required total A squat pivot  ?  ?Ambulation / Gait / Stairs / Wheelchair Mobility ? Ambulation/Gait ?General Gait Details: pt non-ambulatory  ?  ?Posture / Balance Dynamic Sitting Balance ?Sitting balance - Comments: progressing to fair with RUE supported externally ?Balance ?Overall balance assessment: Needs assistance ?Sitting-balance support: Feet supported, Single extremity supported ?Sitting balance-Leahy Scale: Poor ?Sitting balance - Comments: progressing to fair with RUE supported externally ?Standing balance-Leahy Scale: Zero ?Standing balance comment:  dependent on stedy  ?  ?Special needs/care consideration {Special Care Needs/Care Considerations:[id number]}  ? ?Previous Home Environment (from acute therapy documentation) ?Living Arrangements: Spouse/significant other ? Lives With: Spouse ?Available Help at Discharge: Family, Available 24 hours/day ?Type of Home: Apartment ?Home Layout: One level ?Home Access: Level entry ?Bathroom Shower/Tub: Sponge bathes at baseline ?Bathroom Toilet: Handicapped height ?Bathroom Accessibility: Yes ?How Accessible: Other (comment) (family uses  stedy) ?Home Care Services: No ?Additional Comments: has access to RW from pt's MIL ? ?Discharge Living Setting ?Plans for Discharge Living Setting: Patient's home ?Type of Home at Discharge: Apartment ?Discharge Ho

## 2022-04-05 NOTE — Progress Notes (Signed)
?PROGRESS NOTE ? ? ? ?Donald [name]  PJK:[id number] DOB: 11-10-1956 DOA: 03/29/2022 ?PCP: [name], MD  ? ? 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  Patient was then admitted hospital for breakthrough seizures.  Was seen by neurology.  Patient was then admitted to hospital for further evaluation and treatment.  LTM was positive and was started on Depakote and Vimpat which was subsequently changed to Keppra..   ? ?Assessment and Plan: ? Principal Problem: ?  Seizure (HCC) ?Active Problems: ?  OSA (obstructive sleep apnea) ?  Prediabetes ?  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  Dementia (HCC) ?  H/O: CVA (cerebrovascular accident) ?  ?New onset seizure disorder ?Encephalopathy ? ?History of intracranial hemorrhage.  CT head scan this time did not show any acute changes but atrophy.  Neurology was consulted.  EEG showed seizure-like activity.  Patient was started on Depakote 03/29/22 and added Vimpat on 4/19.  Depakote has been discontinued on 4/22 due to supratherapeutic levels, hyperammonemia and somnolence.  Keppra has been initiated.  Neurology has signed off at this time since patient is likely at his baseline.  We will continue Keppra 250 twice a day and Vimpat 100 mg twice daily as per neurology.   If patient gets breakthrough seizures we will need to monitor back on EEG.  pPtient is alert awake and communicative but disoriented. ? 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 Pearlean Brownie and physiatry Dr. Hermelinda Medicus as outpatient. ?  ?Cognitive deficits, vascular Dementia (HCC) ?-Continue delirium precautions ?  ?HTN ?Was on amlodipine as outpatient.   Family reported that he was very sensitive.  Has been started on low-dose while in the hospital at 2.5 mg daily.  Latest blood pressure at 106/64 ? ?History of sleep apnea.   ? ?Deconditioning/debility.  Patient has been seen by physical therapy and therapy.  recommend inpatient rehab at this time.  Family also wishes to CIR.  ? ? DVT prophylaxis:  ?  None ? ?Code Status:   ?  Code Status: Full Code ? ?Disposition: CIR as per PT, OT.  Rehab has been consulted. ? ?Status is: Inpatient ? ?Remains inpatient appropriate because: Intractable seizures, Need for CIR. ? ? Family Communication:  ?Spoke with the patient's wife on the phone on 04/04/2022 ? ?Consultants:  ?Neurology ? ?Procedures:  ?Long-term EEG monitoring ? ?Antimicrobials:  ?None ? ?Anti-infectives (From admission, onward)  ? ? None  ? ?  ? ?Subjective: ? ?Today, patient was seen and examined at bedside.  Patient denies any pain nausea vomiting fever or chills.  Has baseline cognitive dysfunction and is disoriented.  ? ?Objective: ?Vitals:  ? 04/04/22 2000 04/04/22 2355 04/05/22 0400 04/05/22 0732  ?BP: 117/72 95/68 106/64 136/85  ?Pulse: 78 72 63 72  ?Resp: 18 16 14 15   ?Temp: 98.5 ?F (36.9 ?C) 99.3 ?F (37.4 ?C) 97.9 ?F (36.6 ?C) 97.6 ?F (36.4 ?C)  ?TempSrc: Oral Oral Oral Oral  ?SpO2: 98% 100% 100% 100%  ?Weight:      ?Height:      ? ?No intake or output data in the 24 hours ending 04/05/22 1020 ? ?Filed Weights  ? 03/29/22 0618 03/29/22 2100 04/02/22 0553  ?  Weight: 68 kg 64.2 kg 65.2 kg  ? ? ?Physical Examination: ? ?General:  Average built, not in obvious distress, chronically ill, older than stated age, Communicative ?HENT:   No scleral pallor or icterus noted. Oral mucosa is moist.  ?Chest:  Clear breath sounds.  Diminished breath sounds bilaterally. No crackles or wheezes.  ?CVS: S1 &S2 heard. No murmur.  Regular rate and rhythm. ?Abdomen: Soft, nontender, nondistended.  Bowel sounds are heard.   ?Extremities: No cyanosis, clubbing or edema.   Peripheral pulses are palpable. ?Psych: Alert, awake and communicative, disoriented, flat affect ?CNS: Left facial droop, spastic left hemiplegia, right-sided mild weakness ?Skin: Warm and dry.  No rashes noted. ? ?Data Reviewed:  ? ?CBC: ?Recent Labs  ?Lab 03/30/22 ?0708 04/01/22 ?0421 04/02/22 ?0151 04/04/22 ?2956  ?WBC 8.2 5.1 4.9 4.7  ?HGB 13.3 14.1 14.2 14.7  ?HCT 42.3 43.1 42.9 45.8  ?MCV 96.1 91.7 91.9 94.2  ?PLT 230 243 239 211  ? ? ?Basic Metabolic Panel: ?Recent Labs  ?Lab 03/30/22 ?0335 03/30/22 ?0915 04/01/22 ?0421 04/04/22 ?2130  ?NA 140 142 140 139  ?K 4.1 4.5 3.7 4.1  ?CL 108 108 107 107  ?CO2 18* 19* 26 25  ?GLUCOSE 60* 69* 90 92  ?BUN 13 16 8 12   ?CREATININE 1.20 1.11 0.86 0.99  ?CALCIUM 9.5 9.4 9.2 9.3  ?MG  --   --   --  2.1  ? ? ?Liver Function Tests: ?Recent Labs  ?Lab 03/30/22 ?0335  ?AST 26  ?ALT 17  ?ALKPHOS 147*  ?BILITOT 0.9  ?PROT 6.9  ?ALBUMIN 3.9  ? ? ? ?Radiology Studies: ?No results found. ? ? ? LOS: 7 days  ? ? ?04/01/22, MD ?[facility] ?Available via Epic secure chat 7am-7pm ?After these hours, please refer to coverage provider listed on [email or web address] ?04/05/2022, 10:20 AM  ? ? ?

## 2022-04-05 NOTE — Progress Notes (Signed)
Occupational Therapy Treatment ?Patient Details ?Name: Donald [name] ?MRN: [id number] ?DOB: October 21, 1956 ?Today's Date: 04/05/2022 ? ? ?History of present illness Pt is a 66yo male who was brought to ED for seizure like activity and was found to have had seizures. 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  ?OT comments ? Pt is making limited progress towards his goals. He was more alert this date, with eyes open for the majority of the session. He followed ~10% of commands just actively resisted mobility with RUE. He was unable to obtain full upright standing in the steady frame despite total A +1, and required total A squat pivot from the bed>chair. Attempted self feeding with max A cues and physical assist. D/c remains appropriate for maximal recovery towards his baseline. OT to continue to follow acutely.   ? ?Recommendations for follow up therapy are one component of a multi-disciplinary discharge planning process, led by the attending physician.  Recommendations may be updated based on patient status, additional functional criteria and insurance authorization. ?   ?Follow Up Recommendations ? Acute inpatient rehab (3hours/day)  ?  ?Assistance Recommended at Discharge Frequent or constant Supervision/Assistance  ?Patient can return home with the following ? A lot of help with walking and/or transfers;A lot of help with bathing/dressing/bathroom;Assistance with feeding;Direct supervision/assist for medications management;Direct supervision/assist for financial management;Assist for transportation;Help with stairs or ramp for entrance ?  ?Equipment Recommendations ? None recommended by OT  ?  ?Recommendations for Other Services Rehab consult ? ?  ?Precautions / Restrictions Precautions ?Precautions: Fall ?Precaution Comments: seizure ?Required Braces or Orthoses: Splint/Cast ?Splint/Cast: pt's wife reports pt has two different day-time splints for his L hand/wrist  (asked wife to bring them acutely) ?Restrictions ?Weight Bearing Restrictions: No  ? ? ?  ? ?Mobility Bed Mobility ?Overal bed mobility: Needs Assistance ?Bed Mobility: Supine to Sit ?  ?  ?Supine to sit: Total assist ?  ?  ?General bed mobility comments: once sitting pt able to sustain sitting balance with close min G and RUE supported ?  ? ?Transfers ?Overall transfer level: Needs assistance ?Equipment used: Ambulation equipment used ?Transfers: Sit to/from Stand, Bed to chair/wheelchair/BSC ?Sit to Stand: Total assist ?  ?Squat pivot transfers: Total assist ?  ?  ?  ?General transfer comment: attempted use of steady, pt actively resisting. ultimately required total A squat pivot ?Transfer via Lift Equipment: Stedy ?  ?Balance Overall balance assessment: Needs assistance ?Sitting-balance support: Feet supported, Single extremity supported ?Sitting balance-Leahy Scale: Poor ?Sitting balance - Comments: progressing to fair with RUE supported externally ?  ?  ?Standing balance-Leahy Scale: Zero ?  ?  ?  ?  ?  ?  ?  ?  ?  ?  ?  ?  ?   ? ?ADL either performed or assessed with clinical judgement  ? ?ADL Overall ADL's : Needs assistance/impaired ?Eating/Feeding: Maximal assistance;Sitting ?Eating/Feeding Details (indicate cue type and reason): max cues and assist to self feed with RUE ?Grooming: Moderate assistance;Sitting ?Grooming Details (indicate cue type and reason): able to wash face, needs mod cues and assist to reach entire head ?  ?  ?  ?  ?  ?  ?  ?  ?Toilet Transfer: Total assistance;Squat-pivot ?Toilet Transfer Details (indicate cue type and reason): simulated. attempted use of steady, pt actively resisting with RUE. total A squat pivot from bed>chair ?  ?  ?  ?  ?Functional mobility during ADLs: Total assistance ?General ADL Comments:  impiared cognition, difficulty following commands, L spastic hemi ?  ? ?Extremity/Trunk Assessment Upper Extremity Assessment ?Upper Extremity Assessment: RUE  deficits/detail;LUE deficits/detail ?RUE Deficits / Details: difficult to assess - pt not following commands. uses to wash face, no attempt at self feeding despite cues ?LUE Deficits / Details: L spastic hemiplegia ?  ?Lower Extremity Assessment ?Lower Extremity Assessment: Defer to PT evaluation ?  ?  ?  ? ?Vision   ?Vision Assessment?: Vision impaired- to be further tested in functional context ?Additional Comments: eyes open 75% of the session this date ?  ?Perception Perception ?Perception: Not tested ?  ?Praxis Praxis ?Praxis: Not tested ?  ? ?Cognition Arousal/Alertness: Awake/alert (eyes open most of the session) ?Behavior During Therapy: Flat affect ?Overall Cognitive Status: Impaired/Different from baseline ?Area of Impairment: Orientation, Attention, Memory, Following commands, Safety/judgement, Awareness, Problem solving ?  ?  ?  ?  ?  ?  ?  ?  ?Orientation Level: Place, Time, Situation ?Current Attention Level: Focused ?Memory: Decreased short-term memory ?Following Commands: Follows one step commands with increased time ?Safety/Judgement: Decreased awareness of safety, Decreased awareness of deficits ?Awareness: Intellectual ?Problem Solving: Slow processing, Decreased initiation, Requires verbal cues, Requires tactile cues ?General Comments: alert and communicative, disoriented and demonstrated STM deficits Follows <10% of commands ?  ?  ?   ?Exercises   ? ?  ?Shoulder Instructions   ? ? ?  ?General Comments VSS on RA  ? ? ?Pertinent Vitals/ Pain       Pain Assessment ?Pain Assessment: Faces ?Faces Pain Scale: Hurts little more ?Pain Location: grimacing with mobility ?Pain Descriptors / Indicators: Discomfort, Guarding, Grimacing ?Pain Intervention(s): Limited activity within patient's tolerance, Monitored during session ? ?Home Living   ?  ?  ?  ?  ?  ?  ?  ?  ?  ?  ?  ?  ?  ?  ?  ?  ?  ?  ? ?  ?Prior Functioning/Environment    ?  ?  ?  ?   ? ?Frequency ? Min 2X/week  ? ? ? ? ?  ?Progress Toward  Goals ? ?OT Goals(current goals can now be found in the care plan section) ? Progress towards OT goals: Progressing toward goals ? ?Acute Rehab OT Goals ?Patient Stated Goal: not stated ?OT Goal Formulation: With patient/family ?Time For Goal Achievement: 04/16/22 ?Potential to Achieve Goals: Good ?ADL Goals ?Pt Will Perform Grooming: with mod assist;sitting ?Pt Will Transfer to Toilet: with mod assist ?Additional ADL Goal #1: Pt will complete bed mobility with mod A as a precursor to ADLs ?Additional ADL Goal #2: Pt will follow 1 step commands 50% of the session  ?Plan Discharge plan remains appropriate   ? ?Co-evaluation ? ? ?   ?  ?  ?  ?  ? ?  ?AM-PAC OT "6 Clicks" Daily Activity     ?Outcome Measure ? ? Help from another person eating meals?: A Lot ?Help from another person taking care of personal grooming?: A Lot ?Help from another person toileting, which includes using toliet, bedpan, or urinal?: Total ?Help from another person bathing (including washing, rinsing, drying)?: Total ?Help from another person to put on and taking off regular upper body clothing?: Total ?Help from another person to put on and taking off regular lower body clothing?: Total ?6 Click Score: 8 ? ?  ?End of Session Equipment Utilized During Treatment: Other (comment) ? ?OT Visit Diagnosis: Unsteadiness on feet (R26.81);Other abnormalities of gait and mobility (  R26.89);Muscle weakness (generalized) (M62.81);Hemiplegia and hemiparesis ?Hemiplegia - Right/Left: Left ?Hemiplegia - dominant/non-dominant: Non-Dominant ?Hemiplegia - caused by: Cerebral infarction ?  ?Activity Tolerance Patient tolerated treatment well ?  ?Patient Left with call bell/phone within reach;in chair;with chair alarm set ?  ?Nurse Communication Mobility status ?  ? ?   ? ?Time: 0822-0850 ?OT Time Calculation (min): 28 min ? ?Charges: OT General Charges ?$OT Visit: 1 Visit ?OT Treatments ?$Therapeutic Activity: 8-22 mins ? ? ? ?Emmali Karow A Parys Elenbaas ?04/05/2022, 9:07 AM ?

## 2022-04-06 DIAGNOSIS — R569 Unspecified convulsions: Secondary | ICD-10-CM | POA: Diagnosis not present

## 2022-04-06 DIAGNOSIS — Z8673 Personal history of transient ischemic attack (TIA), and cerebral infarction without residual deficits: Secondary | ICD-10-CM | POA: Diagnosis not present

## 2022-04-06 DIAGNOSIS — G4733 Obstructive sleep apnea (adult) (pediatric): Secondary | ICD-10-CM | POA: Diagnosis not present

## 2022-04-06 DIAGNOSIS — F0390 Unspecified dementia without behavioral disturbance: Secondary | ICD-10-CM | POA: Diagnosis not present

## 2022-04-06 LAB — BASIC METABOLIC PANEL
Anion gap: 6 (ref 5–15)
BUN: 12 mg/dL (ref 8–23)
CO2: 27 mmol/L (ref 22–32)
Calcium: 9.1 mg/dL (ref 8.9–10.3)
Chloride: 105 mmol/L (ref 98–111)
Creatinine, Ser: 0.93 mg/dL (ref 0.61–1.24)
GFR, Estimated: >60 mL/min (ref >60)
Glucose, Bld: 93 mg/dL (ref 70–99)
Potassium: 4.2 mmol/L (ref 3.5–5.1)
Sodium: 138 mmol/L (ref 135–145)

## 2022-04-06 LAB — MAGNESIUM: Magnesium: 2.1 mg/dL (ref 1.7–2.4)

## 2022-04-06 MED ORDER — ENOXAPARIN SODIUM 40 MG/0.4ML IJ SOSY
40.0000 mg | PREFILLED_SYRINGE | INTRAMUSCULAR | Status: DC
  Administered 2022-04-06 – 2022-04-07 (×2): 40 mg via SUBCUTANEOUS
  Filled 2022-04-06 (×2): qty 0.4

## 2022-04-06 NOTE — Progress Notes (Signed)
Inpatient Rehab Admissions Coordinator:  ?Continue to await insurance authorization. ? ? ? ? ?Wolfgang Phoenix, MS, CCC-SLP ?Admissions Coordinator ?[phone] ? ?

## 2022-04-06 NOTE — Progress Notes (Signed)
Initial Nutrition Assessment ? ?DOCUMENTATION CODES:  ?Non-severe (moderate) malnutrition in context of chronic illness ? ?INTERVENTION:  ?Continue current diet as ordered, encourage PO intake ?Magic cup TID with meals, each supplement provides 290 kcal and 9 grams of protein ?Up to chair for meals to promote self-feeding and adequate intake ? ?NUTRITION DIAGNOSIS:  ?Moderate Malnutrition related to chronic illness (hx of CVA with deficits) as evidenced by mild muscle depletion, mild fat depletion. ? ?GOAL:  ?Patient will meet greater than or equal to 90% of their needs ? ?MONITOR:  ?PO intake, Weight trends, Supplement acceptance ? ?REASON FOR ASSESSMENT:  ?Malnutrition Screening Tool ?  ? ?ASSESSMENT:  ?66 y.o. male with hx of CVA with residual left-sided weakness and cognitive deficits presented to ED after wife witnessed seizure like activity at home.  ? ?Noted to be wheel chair bound at baseline. ? ?Pt resting in bedside chair at the time of assessment eating a snack. Wife at bedside and provides hx for pt.  ? ?Wife reports that pt has a great appetite at baseline and she can tell he has regained some of the weight he lost during his admission last summer. States that she is unsure of how much he weighs currently. Pt is wheelchair bound and she has no way of checking his weight at home or the PCP office. Zeroed out bed so that weight could be assessed when pt was moved back later this afternoon.  ? ?Discussed typical intake. States that pt does feed himself and normally eats 3 meals with snacks in between. Pt eats form all food groups daily. Wife states they do not like the ensure/boost products, but that she does make smoothies for pt and adds protein powder. Will also add it into his oatmeal at times. No concerns with intake at home. Denies chewing or swallowing problems when eating.  ? ?Wife did note that pt has a hard time feeding himself when hs is in the bed. Sometimes is in an awkward position and has to  raise arms up to reach trak and food due to the bed rails. Requests that pt be in chairs for meals so that he can easily consume his meals.  ? ?On exam, some muscle and fat depletions still noted but are improved from physical exam performed during last admission. Leg muscle more depleted than the rest of pt's body, but explained by wheel chair bound status. ? ?Will add magic cup for pt (loves ice cream) to add an additional source of nutrition while admitted. ? ?Average Meal Intake: ?4/19-4/25: 46% intake x 8 recorded meals ? ?Nutritionally Relevant Medications: ?Scheduled Meds: ? levETIRAcetam  250 mg Oral BID  ? multivitamin with minerals  1 tablet Oral Daily  ? polyethylene glycol  17 g Oral Daily  ? ?PRN Meds: ondansetron, senna-docusate ? ?Labs Reviewed ? ?NUTRITION - FOCUSED PHYSICAL EXAM: ?Flowsheet Row Most Recent Value  ?Orbital Region No depletion  ?Upper Arm Region Mild depletion  ?Thoracic and Lumbar Region Mild depletion  ?Buccal Region No depletion  ?Temple Region No depletion  ?Clavicle Bone Region Mild depletion  ?Clavicle and Acromion Bone Region Mild depletion  ?Scapular Bone Region No depletion  ?Dorsal Hand Mild depletion  ?Patellar Region Moderate depletion  ?Anterior Thigh Region Moderate depletion  ?Posterior Calf Region Moderate depletion  ?Edema (RD Assessment) None  ?Hair Reviewed  ?Eyes Reviewed  ?Mouth Reviewed  ?Skin Reviewed  ?Nails Reviewed  ? ?Diet Order:   ?Diet Order   ? ?       ?  Diet regular Room service appropriate? Yes; Fluid consistency: Thin  Diet effective now       ?  ? ?  ?  ? ?  ? ? ?EDUCATION NEEDS:  ?No education needs have been identified at this time ? ?Skin:  Skin Assessment: Reviewed RN Assessment ? ?Last BM:  4/21 ? ?Height:  ?Ht Readings from Last 1 Encounters:  ?03/29/22 5\' 5"  (1.651 m)  ? ? ?Weight:  ?Wt Readings from Last 1 Encounters:  ?04/06/22 61.8 kg  ? ? ?Ideal Body Weight:  59.1 kg ? ?BMI:  Body mass index is 22.67 kg/m?. ? ?Estimated Nutritional Needs:   ?Kcal:  1800-2000 kcal/d ?Protein:  90-100 g/d ?Fluid:  >/=2L/d ? ? ?Ranell Patrick, RD, LDN ?Clinical Dietitian ?RD pager # available in [place]  ?After hours/weekend pager # available in [place] ?

## 2022-04-06 NOTE — H&P (Incomplete)
? ? ?Physical Medicine and Rehabilitation Admission H&P ? ?  ?Chief Complaint  ?Patient presents with  ? Functional deficits due to new onset of seizures.   ? ? 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  EEG revealed one focal motor seizure with LLE jerking as well as two more seizures without clinical signs. He had another episode with generalized motor seizure while in ED,  was loaded with Depakote (Keppra not used due to SE in the past) and placed on LT-EEG which showed ongoing seizures. Vimpat added with resolution of seizure. Seizure felt to be secondary to encephalomalacia from prior right hemisphere ICH.  ? ?He did develop somnolence due to supra-therapeutic Valproate levels (123) and as well as hyperammonemia ( NH3-71).  Depakote discontinued and low dose Keppra started with recommendations for repeat EEG monitoring in case  of breakthrough seizures. Mentation has been improving but he tends to keeps eyes shut most of times and showing improvement in activity. CIR recommended due to decline from baseline.  ? ?At baseline  patient could stand in [employer], was almost continent of B/B, was able to express needs and was able to feed himself independently.  ? ? ?Review of Systems  ?Unable to perform ROS: Other  ? ? ?Past Medical History:  ?Diagnosis Date  ? Arthritis   ? Stroke [facility])   ? ? ?Past Surgical History:  ?Procedure Laterality Date  ? APPENDECTOMY    ? IR ANGIO INTRA EXTRACRAN SEL COM CAROTID INNOMINATE BILAT MOD SED  05/28/2021  ? IR ANGIO VERTEBRAL SEL VERTEBRAL BILAT MOD SED  05/28/2021  ? IR IVC FILTER PLMT / S&I /IMG GUID/MOD SED  05/27/2021  ? IR US GUIDE VASC ACCESS RIGHT   05/27/2021  ? ? ?Family History  ?Problem Relation Age of Onset  ? Hypertension Mother   ? Colon polyps Neg Hx   ? Colon cancer Neg Hx   ? Esophageal cancer Neg Hx   ? Rectal cancer Neg Hx   ? Stomach cancer Neg Hx   ? ? ?Social History:  Married. From [place] and has been in [place] since 1986. 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 Per reports that he has never smoked. He has never used smokeless tobacco. He reports that he does not drink alcohol and does not use drugs. ? ?  ?Allergies  ?Allergen Reactions  ? Sulfa Antibiotics Other (See Comments)  ?  Nausea and eye swelling ?  ? ? ?Medications Prior to Admission  ?Medication Sig Dispense Refill  ? Multiple Vitamin (MULTIVITAMIN) tablet Take 1 tablet by mouth daily.    ? zinc gluconate 50 MG tablet Take 50 mg by mouth daily.    ? amLODipine (NORVASC) 2.5 MG tablet Take 1 tablet (2.5 mg total) by mouth daily. (Patient not taking: Reported on 02/09/2022) 90 tablet 0  ? ? ? ? ?Home: ?Home Living ?Family/patient expects to be discharged to:: Private residence ?Living Arrangements: Spouse/significant other ?Available Help at Discharge: Family, Available 24 hours/day ?Type of Home: Apartment ?Home Access: Level entry ?Home Layout: One level ?Bathroom Shower/Tub: Sponge bathes at baseline ?Bathroom Toilet: Handicapped height ?Bathroom Accessibility: Yes ?Home Equipment: Shower seat - built  in, Grab bars - toilet, Grab bars - tub/shower, Wheelchair - manual, Other (comment) (steady) ?Additional Comments: has access to RW from pt's MIL ? Lives With: Spouse ?  ?Functional History: ?Prior Function ?Prior Level of Function : Needs assist ?Physical Assist : Mobility (physical), ADLs (physical) ?Mobility (physical): Bed mobility, Transfers, Stairs, Gait ?ADLs (physical): Feeding, Grooming, Bathing, Dressing, Toileting, IADLs ?Mobility Comments: wc level, uses steady to transfer with wife assist. dependent for wc mobility ?ADLs Comments: wife assists wtih all aspects of care -  sponge bathes in the [place]; pt can assist with grooming and feeding ? ?Functional Status:  ?Mobility: ?Bed Mobility ?Overal bed mobility: Needs Assistance ?Bed Mobility: Supine to Sit ?Supine to sit: Total assist ?Sit to supine: Total assist, +2 for physical assistance, +2 for safety/equipment ?General bed mobility comments: once sitting pt able to sustain sitting balance with close min G and RUE supported ?Transfers ?Overall transfer level: Needs assistance ?Equipment used: Ambulation equipment used ?Transfers: Sit to/from Stand, Bed to chair/wheelchair/BSC ?Sit to Stand: Total assist ?Bed to/from chair/wheelchair/BSC transfer type:: Squat pivot ?Squat pivot transfers: Total assist ?Transfer via Lift Equipment: Stedy ?General transfer comment: attempted use of steady, pt actively resisting. ultimately required total A squat pivot ?Ambulation/Gait ?General Gait Details: pt non-ambulatory ?  ? ?ADL: ?ADL ?Overall ADL's : Needs assistance/impaired ?Eating/Feeding: Maximal assistance, Sitting ?Eating/Feeding Details (indicate cue type and reason): max cues and assist to self feed with RUE ?Grooming: Moderate assistance, Sitting ?Grooming Details (indicate cue type and reason): able to wash face, needs mod cues and assist to reach entire head ?Toilet Transfer: Total assistance, Squat-pivot ?Toilet Transfer Details (indicate cue type and reason): simulated. attempted use of steady, pt actively resisting with RUE. total A squat pivot from bed>chair ?Functional mobility during ADLs: Total assistance ?General ADL Comments: impiared cognition, difficulty following commands, L spastic hemi ? ?Cognition: ?Cognition ?Overall Cognitive Status: Impaired/Different from baseline ?Orientation Level: Oriented to person ?Cognition ?Arousal/Alertness: Awake/alert (eyes open most of the session) ?Behavior During Therapy: Flat affect ?Overall Cognitive Status: Impaired/Different from baseline ?Area of Impairment: Orientation, Attention,  Memory, Following commands, Safety/judgement, Awareness, Problem solving ?Orientation Level: Place, Time, Situation ?Current Attention Level: Focused ?Memory: Decreased short-term memory ?Following Commands: Follows one step commands with increased time ?Safety/Judgement: Decreased awareness of safety, Decreased awareness of deficits ?Awareness: Intellectual ?Problem Solving: Slow processing, Decreased initiation, Requires verbal cues, Requires tactile cues ?General Comments: alert and communicative, disoriented and demonstrated STM deficits Follows <10% of commands ? ? ?Blood pressure (!) 147/95, pulse 93, temperature 98.6 ?F (37 ?C), temperature source Oral, resp. rate 16, height 5\' 5"  (1.651 m), weight 61.8 kg, SpO2 100 %. ?Physical Exam ? ?Results for orders placed or performed during the hospital encounter of 03/29/22 (from the past 48 hour(s))  ?CBC     Status: None  ? Collection Time: 04/05/22  4:21 PM  ?Result Value Ref Range  ? WBC 4.3 4.0 - 10.5 K/uL  ? RBC 4.54 4.22 - 5.81 MIL/uL  ? Hemoglobin 13.8 13.0 - 17.0 g/dL  ? HCT 42.7 39.0 - 52.0 %  ? MCV 94.1 80.0 - 100.0 fL  ? MCH 30.4 26.0 - 34.0 pg  ? MCHC 32.3 30.0 - 36.0 g/dL  ? RDW 13.1 11.5 - 15.5 %  ? Platelets 220 150 - 400 K/uL  ? nRBC 0.0 0.0 - 0.2 %  ?  Comment: Performed at [facility], [address] [place], [place] [address]  ?Magnesium     Status: None  ?  Collection Time: 04/06/22  2:46 AM  ?Result Value Ref Range  ? Magnesium 2.1 1.7 - 2.4 mg/dL  ?  Comment: Performed at [facility], [address], [place], [place] [address]  ?Basic metabolic panel     Status: None  ? Collection Time: 04/06/22  2:46 AM  ?Result Value Ref Range  ? Sodium 138 135 - 145 mmol/L  ? Potassium 4.2 3.5 - 5.1 mmol/L  ? Chloride 105 98 - 111 mmol/L  ? CO2 27 22 - 32 mmol/L  ? Glucose, Bld 93 70 - 99 mg/dL  ?  Comment: Glucose reference range applies only to samples taken after fasting for at least 8 hours.  ? BUN 12 8 - 23 mg/dL  ? Creatinine, Ser 0.93  0.61 - 1.24 mg/dL  ? Calcium 9.1 8.9 - 10.3 mg/dL  ? GFR, Estimated >60 >60 mL/min  ?  Comment: (NOTE) ?Calculated using the CKD-EPI Creatinine Equation (2021) ?  ? Anion gap 6 5 - 15  ?  Comment: Perfo

## 2022-04-06 NOTE — Progress Notes (Signed)
Physical Therapy Treatment ?Patient Details ?Name: Donald [name] ?MRN: [id number] ?DOB: October 08, 1956 ?Today's Date: 04/06/2022 ? ? ?History of Present Illness Pt is a 66yo male who was brought to ED for seizure like activity and was found to have had seizures. 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 ?  ?PT Comments  ? ? Pt with improved level of alertness and engagement in PT session today. Pt remains to require maxAx2 for sit to stand up into stedy for transfer to recliner. Continue to recommend AIR upon d/c to achieve safe 1 person assist for safe transition home with spouse. Spouse was able to transfer pt by herself PTA. Acute PT to cont to follow. ?   ?Recommendations for follow up therapy are one component of a multi-disciplinary discharge planning process, led by the attending physician.  Recommendations may be updated based on patient status, additional functional criteria and insurance authorization. ? ?Follow Up Recommendations ? Acute inpatient rehab (3hours/day) ?  ?  ?Assistance Recommended at Discharge Frequent or constant Supervision/Assistance  ?Patient can return home with the following A lot of help with walking and/or transfers;A lot of help with bathing/dressing/bathroom;Direct supervision/assist for medications management;Direct supervision/assist for financial management;Assist for transportation;Help with stairs or ramp for entrance;Assistance with feeding;Assistance with cooking/housework ?  ?Equipment Recommendations ? None recommended by PT  ?  ?Recommendations for Other Services Rehab consult ? ? ?  ?Precautions / Restrictions Precautions ?Precautions: Fall ?Precaution Comments: seizure ?Required Braces or Orthoses: Splint/Cast ?Splint/Cast: pt's wife reports pt has two different day-time splints for his L hand/wrist (asked wife to bring them acutely) ?Restrictions ?Weight Bearing Restrictions: No  ?  ? ?Mobility ? Bed Mobility ?Overal bed  mobility: Needs Assistance ?Bed Mobility: Supine to Sit ?  ?  ?Supine to sit: Total assist ?Sit to supine: Total assist, +2 for physical assistance, +2 for safety/equipment ?  ?General bed mobility comments: once sitting pt able to sustain sitting balance with close min G and RUE supported ?  ? ?Transfers ?Overall transfer level: Needs assistance ?Equipment used: Ambulation equipment used ?Transfers: Sit to/from Stand, Bed to chair/wheelchair/BSC ?Sit to Stand: Total assist ?  ?  ?Squat pivot transfers: Total assist ?  ?  ?General transfer comment: max verbal cues to no push away from stedy with R UE and to pull self up/bend R elbow, 4 standing trials today ?Transfer via Lift Equipment: Stedy ? ?Ambulation/Gait ?  ?  ?  ?  ?  ?  ?  ?General Gait Details: pt non-ambulatory ? ? ?Stairs ?  ?  ?  ?  ?  ? ? ?Wheelchair Mobility ?  ? ?Modified Rankin (Stroke Patients Only) ?  ? ? ?  ?Balance Overall balance assessment: Needs assistance ?Sitting-balance support: Feet supported, Single extremity supported ?Sitting balance-Leahy Scale: Poor ?Sitting balance - Comments: progressing to fair with RUE supported externally ?  ?  ?Standing balance-Leahy Scale: Zero ?Standing balance comment: dependent on stedy ?  ?  ?  ?  ?  ?  ?  ?  ?  ?  ?  ?  ? ?  ?Cognition Arousal/Alertness: Awake/alert (eyes open most of the session) ?Behavior During Therapy: Flat affect ?Overall Cognitive Status: Impaired/Different from baseline ?Area of Impairment: Orientation, Attention, Memory, Following commands, Safety/judgement, Awareness, Problem solving ?  ?  ?  ?  ?  ?  ?  ?  ?Orientation Level: Place, Time, Situation (suspect this to be baseline) ?Current Attention Level: Focused ?Memory: Decreased  short-term memory ?Following Commands: Follows one step commands with increased time ?Safety/Judgement: Decreased awareness of safety, Decreased awareness of deficits ?Awareness: Intellectual ?Problem Solving: Slow processing, Decreased initiation,  Requires verbal cues, Requires tactile cues ?General Comments: pt initially irritated and confused but progressed to more alert and engaged in conversation, initiating conversation and laughing for the first time this hospital stay ?  ?  ? ?  ?Exercises Other Exercises ?Other Exercises: worked on R LE LAQ and stretching due to incresaed stiffness/tightness ? ?  ?General Comments General comments (skin integrity, edema, etc.): VSS on RA ?  ?  ? ?Pertinent Vitals/Pain Pain Assessment ?Pain Assessment: Faces ?Faces Pain Scale: Hurts little more ?Pain Location: grimacing with mobility ?Pain Descriptors / Indicators: Discomfort, Guarding, Grimacing  ? ? ?Home Living   ?  ?  ?  ?  ?  ?  ?  ?  ?  ?   ?  ?Prior Function    ?  ?  ?   ? ?PT Goals (current goals can now be found in the care plan section) Acute Rehab PT Goals ?PT Goal Formulation: With patient/family ?Time For Goal Achievement: 04/16/22 ?Potential to Achieve Goals: Good ?Progress towards PT goals: Progressing toward goals ? ?  ?Frequency ? ? ? Min 3X/week ? ? ? ?  ?PT Plan Current plan remains appropriate  ? ? ?Co-evaluation   ?  ?  ?  ?  ? ?  ?AM-PAC PT "6 Clicks" Mobility   ?Outcome Measure ? Help needed turning from your back to your side while in a flat bed without using bedrails?: Total ?Help needed moving from lying on your back to sitting on the side of a flat bed without using bedrails?: Total ?Help needed moving to and from a bed to a chair (including a wheelchair)?: Total ?Help needed standing up from a chair using your arms (e.g., wheelchair or bedside chair)?: Total ?Help needed to walk in hospital room?: Total ?Help needed climbing 3-5 steps with a railing? : Total ?6 Click Score: 6 ? ?  ?End of Session Equipment Utilized During Treatment: Gait belt ?Activity Tolerance: Patient limited by lethargy ?Patient left: with call bell/phone within reach;in chair;with chair alarm set;with family/visitor present ?Nurse Communication: Mobility status ?PT  Visit Diagnosis: Muscle weakness (generalized) (M62.81) ?  ? ? ?Time: 3500-9381 ?PT Time Calculation (min) (ACUTE ONLY): 31 min ? ?Charges:  $Therapeutic Exercise: 8-22 mins ?$Therapeutic Activity: 8-22 mins          ?          ? ?Lewis Shock, PT, DPT ?Acute Rehabilitation Services ?Secure chat preferred ?Office #: [phone] ? ? ? ?Thy Gullikson M Melvern Ramone ?04/06/2022, 12:53 PM ? ?

## 2022-04-06 NOTE — Progress Notes (Signed)
?PROGRESS NOTE ? ? ? ?Donald [name]  RJJ:[id number] DOB: 1956/09/11 DOA: 03/29/2022 ?PCP: [name], MD  ? ? 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  Patient was then admitted hospital for breakthrough seizures.  Was seen by neurology.  Patient was then admitted to hospital for further evaluation and treatment.  LTM was positive and was started on Depakote and Vimpat which was subsequently changed to Keppra..   ? ?Assessment and Plan: ? Principal Problem: ?  Seizure (HCC) ?Active Problems: ?  OSA (obstructive sleep apnea) ?  Prediabetes ?  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  Dementia (HCC) ?  H/O: CVA (cerebrovascular accident) ?  ?New onset seizure disorder ?Encephalopathy ? ?History of intracranial hemorrhage.  CT head scan this time did not show any acute changes but atrophy.  Neurology was consulted.  EEG showed seizure-like activity.  Patient was started on Depakote 03/29/22 and added Vimpat on 4/19.  Depakote has been discontinued on 4/22 due to supratherapeutic levels, hyperammonemia and somnolence.  Keppra has been initiated.  Neurology has signed off at this time since patient is likely at his baseline.  We will continue Keppra 250 twice a day and Vimpat 100 mg twice daily as per neurology.   If patient gets breakthrough seizures we will need to monitor back on EEG.  Patient is alert awake and communicative and at his baseline mentation.. ? 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 Pearlean Brownie and physiatry Dr. Hermelinda Medicus as outpatient. ?  ?Cognitive deficits, vascular Dementia (HCC) ?-Continue delirium precautions.  Patient is oriented today. ?   ?HTN ?Was on amlodipine as outpatient.  Family reported that he was very sensitive.  Has been started on low-dose while in the hospital at 2.5 mg daily.  Latest blood pressure at 20/95 ? ?History of sleep apnea.   ? ?Deconditioning/debility.  Patient has been seen by physical therapy and therapy.  recommend inpatient rehab at this time.  Family also wishes to CIR.  ? ? DVT prophylaxis:  ?  Lovenox ? ?Code Status:   ?  Code Status: Full Code ? ?Disposition: CIR as per PT, OT.   Medically stable for disposition. ? ?Status is: Inpatient ? ?Remains inpatient appropriate because: Intractable seizures, Need for CIR. ? ? Family Communication:  ?Spoke with the patient's wife on 04/04/2022 ? ?Consultants:  ?Neurology ? ?Procedures:  ?Long-term EEG monitoring ? ?Antimicrobials:  ?None ? ?Anti-infectives (From admission, onward)  ? ? None  ? ?  ? ?Subjective: ? ?Today, patient was seen and examined at bedside.  Patient states that he wants to eat.  Denies any headache dizziness lightheadedness shortness of breath.   ? ?Objective: ?Vitals:  ? 04/05/22 2015 04/05/22 2316 04/06/22 0512 04/06/22 0840  ?BP: 110/74 121/77 116/76 (!) 147/95  ?Pulse: 84 83 73 93  ?Resp: 16 18 16 16   ?Temp: 98.5 ?F (36.9 ?C) 99 ?F (37.2 ?C) 98 ?F (36.7 ?C) 98.6 ?F (37 ?C)  ?TempSrc: Oral Oral Oral Oral  ?SpO2: 100% 100% 100% 100%  ?Weight:   61.8 kg   ?Height:      ? ? ?Intake/Output Summary (Last 24 hours) at 04/06/2022 1149 ?Last data filed at 04/05/2022 1800 ?  Gross per 24 hour  ?Intake --  ?Output 575 ml  ?Net -575 ml  ? ? ?Filed Weights  ? 03/29/22 2100 04/02/22 0553 04/06/22 0512  ?Weight: 64.2 kg 65.2 kg 61.8 kg  ? ? ?Physical Examination: ?Body mass index is 22.67 kg/m?.  ?General:  Average built, not in obvious distress, appears chronically ill, Communicative ?HENT:   No scleral pallor or icterus noted. Oral mucosa is moist.  ?Chest:  Clear breath sounds.  Diminished breath sounds bilaterally. No crackles or wheezes.  ?CVS: S1 &S2 heard. No  murmur.  Regular rate and rhythm. ?Abdomen: Soft, nontender, nondistended.  Bowel sounds are heard.   ?Extremities: No cyanosis, clubbing or edema.  Peripheral pulses are palpable. ?Psych: Alert, awake and oriented, flat affect. ?CNS:  No cranial nerve deficits.  Spastic left hemiplegia, right-sided mild weakness, left facial droop ?Skin: Warm and dry.  No rashes noted. ? ?Data Reviewed:  ? ?CBC: ?Recent Labs  ?Lab 04/01/22 ?0421 04/02/22 ?0151 04/04/22 ?1937 04/05/22 ?1621  ?WBC 5.1 4.9 4.7 4.3  ?HGB 14.1 14.2 14.7 13.8  ?HCT 43.1 42.9 45.8 42.7  ?MCV 91.7 91.9 94.2 94.1  ?PLT 243 239 211 220  ? ? ?Basic Metabolic Panel: ?Recent Labs  ?Lab 04/01/22 ?0421 04/04/22 ?9024 04/06/22 ?0246  ?NA 140 139 138  ?K 3.7 4.1 4.2  ?CL 107 107 105  ?CO2 26 25 27   ?GLUCOSE 90 92 93  ?BUN 8 12 12   ?CREATININE 0.86 0.99 0.93  ?CALCIUM 9.2 9.3 9.1  ?MG  --  2.1 2.1  ? ? ?Liver Function Tests: ?No results for input(s): AST, ALT, ALKPHOS, BILITOT, PROT, ALBUMIN in the last 168 hours. ? ? ? ?Radiology Studies: ?No results found. ? ? ? LOS: 8 days  ? ? ? , MD ?[facility] ?Available via Epic secure chat 7am-7pm ?After these hours, please refer to coverage provider listed on [email or web address] ?04/06/2022, 11:49 AM  ? ? ?

## 2022-04-07 MED ORDER — LACOSAMIDE 100 MG PO TABS: 100.0000 mg | ORAL_TABLET | Freq: Two times a day (BID) | ORAL | 0 refills | Status: DC

## 2022-04-07 MED ORDER — AMLODIPINE BESYLATE 2.5 MG PO TABS: 2.5000 mg | ORAL_TABLET | Freq: Every day | ORAL | 0 refills | Status: DC

## 2022-04-07 MED ORDER — LEVETIRACETAM 100 MG/ML PO SOLN: 250.0000 mg | Freq: Two times a day (BID) | ORAL | 0 refills | Status: DC

## 2022-04-07 MED ORDER — POLYETHYLENE GLYCOL 3350 17 G PO PACK: 17.0000 g | PACK | Freq: Every day | ORAL | 0 refills | Status: DC

## 2022-04-07 NOTE — Progress Notes (Signed)
Spouse insist pt be discharge tonight after night meds. Discharge summary and order verified. Son here to transport pt home via personal vehicle.  ?

## 2022-04-07 NOTE — Progress Notes (Signed)
?PROGRESS NOTE ? ? ? ?Donald [name]  [id number] DOB: 1956-02-21 DOA: 03/29/2022 ?PCP: [name], MD  ? ? 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  Patient was then admitted hospital for breakthrough seizures.  Was seen by neurology.  Patient was then admitted to hospital for further evaluation and treatment.  LTM was positive and was started on Depakote and Vimpat which was subsequently changed to [place]..   ? ?Assessment and Plan: ? Principal Problem: ?  Seizure ([place]) ?Active Problems: ?  OSA (obstructive sleep apnea) ?  Prediabetes ?  Spastic hemiparesis of left nondominant side ([place]) ?  Dementia ([place]) ?  H/O: CVA (cerebrovascular accident) ?  ?New onset seizure disorder ?Encephalopathy ? ?History of intracranial hemorrhage.  CT head scan this time did not show any acute changes but atrophy.  Neurology was consulted.  EEG showed seizure-like activity.  Patient was started on Depakote 03/29/22 and added Vimpat on 4/19.  Depakote has been discontinued on 4/22 due to supratherapeutic levels, hyperammonemia and somnolence.  Keppra has been initiated.  Neurology has signed off at this time since patient is likely at his baseline.  We will continue Keppra 250 twice a day and Vimpat 100 mg twice daily as per neurology.   If patient gets breakthrough seizures we will need to monitor back on EEG.  Patient is alert awake and communicative and at his baseline mentation.. ? 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 Leonie [name] and physiatry Dr. Tessa [name] as outpatient. ?  ?Cognitive deficits, vascular Dementia (Donald [name]) ?-Continue delirium precautions.  Patient is oriented today. ?   ?HTN ?Was on amlodipine as outpatient.  Family reported that he was very sensitive.  Has been started on low-dose while in the hospital at 2.5 mg daily.  Latest blood pressure at 20/95 ? ?History of sleep apnea.   ? ?Deconditioning/debility.  Patient has been seen by physical therapy and therapy.  recommend inpatient rehab at this time.  Family also wishes to CIR.  ?Patient's insurance was denied. ? ? DVT prophylaxis: enoxaparin (LOVENOX) injection 40 mg Start: 04/06/22 1245 ?  Lovenox ? ?Code Status:   ?  Code Status: Full Code ? ?Disposition: CIR as per PT, OT.   Medically stable for disposition. ? ?Status is: Inpatient ? ?Remains inpatient appropriate because: Intractable seizures, Need for CIR. ? ? Family Communication:  ?None at bedside. ? ?Consultants:  ?Neurology ? ?Procedures:  ?Long-term EEG monitoring ? ?Antimicrobials:  ?None ? ?Anti-infectives (From admission, onward)  ? ? None  ? ?  ? ?Subjective: ? ?Patient was seen and examined at his baseline.  Weak appearing.  No new complaints at the time of the visit.  Working on DC planning.  Patient is medically cleared for discharge. ? ?Objective: ?Vitals:  ? 04/06/22 2304 04/07/22 0323 04/07/22 0814 04/07/22 1134  ?BP: (!) 128/54 (!) 101/53 107/66 124/82  ?Pulse: 99 86 85 88  ?Resp: 18  18 16   ?Temp: 98.7 ?F (37.1 ?C) 97.9 ?F (36.6 ?C) 98.9 ?F (37.2 ?C) 97.6 ?F (36.4 ?C)  ?TempSrc: Oral  Axillary Axillary  ?SpO2: 99% 100% 99% 100%  ?Weight:      ?Height:      ? ? ?Intake/Output  Summary (Last 24 hours) at 04/07/2022 1201 ?Last data filed at 04/07/2022 [id number] ?Gross per 24 hour  ?Intake 840 ml  ?Output 1300 ml  ?Net -460 ml  ? ? ?Filed Weights  ? 04/02/22 0553 04/06/22 0512 04/06/22 1456  ?Weight: 65.2 kg 61.8 kg 58.5 kg  ? ? ?Physical Examination: ?Body mass index is 21.45 kg/m?.  ?General: Weak appearing.  No acute distress.  Alert and responding to questions appropriately. ?HENT:   No scleral pallor or icterus noted. Oral mucosa is moist.  ?Chest: Clear to  auscultation no wheezes or rales.  Poor inspiratory effort.   ?CVS: Regular rate and rhythm no rubs or gallops. ?Abdomen: Soft, nontender, nondistended.  Bowel sounds are heard.   ?Extremities: No cyanosis, clubbing or edema.  Peripheral pulses are palpable. ?Psych: Alert, awake and oriented, flat affect. ?CNS:  No cranial nerve deficits.  Spastic left hemiplegia, right-sided mild weakness, left facial droop ?Skin: Warm and dry.  No rashes noted. ? ?Data Reviewed:  ? ?CBC: ?Recent Labs  ?Lab 04/01/22 ?0421 04/02/22 ?0151 04/04/22 ?[id number] 04/05/22 ?1621  ?WBC 5.1 4.9 4.7 4.3  ?HGB 14.1 14.2 14.7 13.8  ?HCT 43.1 42.9 45.8 42.7  ?MCV 91.7 91.9 94.2 94.1  ?PLT 243 239 211 220  ? ? ?Basic Metabolic Panel: ?Recent Labs  ?Lab 04/01/22 ?0421 04/04/22 ?[id number] 04/06/22 ?0246  ?NA 140 139 138  ?K 3.7 4.1 4.2  ?CL 107 107 105  ?CO2 26 25 27   ?GLUCOSE 90 92 93  ?BUN 8 12 12   ?CREATININE 0.86 0.99 0.93  ?CALCIUM 9.2 9.3 9.1  ?MG  --  2.1 2.1  ? ? ?Liver Function Tests: ?No results for input(s): AST, ALT, ALKPHOS, BILITOT, PROT, ALBUMIN in the last 168 hours. ? ? ? ?Radiology Studies: ?No results found. ? ? ? LOS: 9 days  ? ? ?Kayleen Memos, MD ?[facility] ?Available via Epic secure chat 7am-7pm ?After these hours, please refer to coverage provider listed on [email or web address] ?04/07/2022, 12:01 PM  ? ? ?

## 2022-04-07 NOTE — Progress Notes (Signed)
Physical Therapy Treatment ?Patient Details ?Name: Donald [name] ?MRN: [id number] ?DOB: 06-27-1956 ?Today's Date: 04/07/2022 ? ? ?History of Present Illness Pt is a 66yo male who was brought to ED for seizure like activity and was found to have had seizures. 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 ?  ?PT Comments  ? ? Continued functional mobility and sitting balance training today. Pt. Presenting with lethargy which pt's wife states has been his state throughout the day, therefore he had some limited command following. He was Total A for supine to sit at EOB and required constant support to maintain sitting balance. He was able to balance while propped on R elbow for a short time min guard. Wife states that this is a significant decline from his ability at home and is concerned with her ability to transfer him at his current level. At this time SPT recommending AIR to further progress his bed mobility, seated balance, and transfer ability to a level that his wife can handle at home. Current plan and discharge setting remain unchanged. PT to follow up acutely as able.  ?  ?Recommendations for follow up therapy are one component of a multi-disciplinary discharge planning process, led by the attending physician.  Recommendations may be updated based on patient status, additional functional criteria and insurance authorization. ? ?Follow Up Recommendations ? Acute inpatient rehab (3hours/day) ?  ?  ?Assistance Recommended at Discharge Frequent or constant Supervision/Assistance  ?Patient can return home with the following A lot of help with walking and/or transfers;A lot of help with bathing/dressing/bathroom;Direct supervision/assist for medications management;Direct supervision/assist for financial management;Assist for transportation;Help with stairs or ramp for entrance;Assistance with feeding;Assistance with cooking/housework ?  ?Equipment Recommendations ? None  recommended by PT  ?  ?Recommendations for Other Services Rehab consult ? ? ?  ?Precautions / Restrictions Precautions ?Precautions: Fall ?Precaution Comments: seizure, residual L hemiparesis ?Required Braces or Orthoses: Splint/Cast ?Splint/Cast: pt's wife reports pt has two different day-time splints for his L hand/wrist (asked wife to bring them acutely) ?Restrictions ?Weight Bearing Restrictions: No  ?  ? ?Mobility ? Bed Mobility ?Overal bed mobility: Needs Assistance ?Bed Mobility: Supine to Sit, Sit to Supine ?  ?  ?Supine to sit: Total assist ?Sit to supine: Total assist ?  ?General bed mobility comments: A for trunk and LE ?Patient Response: Flat affect ? ?Transfers ?Overall transfer level: Needs assistance ?  ?  ?  ?  ?  ?  ?  ? Lateral/Scoot Transfers: Total assist, +2 physical assistance ?General transfer comment: Total A w/ sequencing cues for one L scoot to [facility] ?  ? ? ? ?Modified Rankin (Stroke Patients Only) ?Modified Rankin (Stroke Patients Only) ?Pre-Morbid Rankin Score: Severe disability ?Modified Rankin: Severe disability ? ? ?  ?Balance Overall balance assessment: Needs assistance ?Sitting-balance support: Feet supported, Single extremity supported ?Sitting balance-Leahy Scale: Poor ?Sitting balance - Comments: Requires max A to maintain upright sitting, posterior bias, able to prop on R elbow with min guard ?Postural control: Right lateral lean, Posterior lean ?  ?Standing balance-Leahy Scale: Zero ?  ?  ?  ?  ?  ?  ?  ?  ?  ?  ?  ?  ?  ? ?  ?Cognition Arousal/Alertness: Lethargic ?Behavior During Therapy: Flat affect ?Overall Cognitive Status: Impaired/Different from baseline ?Area of Impairment: Attention, Following commands, Awareness, Problem solving, Memory, Safety/judgement ?  ?  ?  ?  ?  ?  ?  ?  ?  ?  Current Attention Level: Focused ?Memory: Decreased short-term memory ?Following Commands: Follows one step commands inconsistently, Follows one step commands with increased  time ?Safety/Judgement: Decreased awareness of safety, Decreased awareness of deficits ?Awareness: Intellectual ?Problem Solving: Slow processing, Decreased initiation, Difficulty sequencing, Requires verbal cues ?General Comments: Pt minimally awake during session, engages in some appropriate conversation. Limited insight to deficits and limited command following ?  ?  ? ?  ?   ?General Comments General comments (skin integrity, edema, etc.): Session limited by pt lethargy, he compained significantly of "cramping" and felt relief when R LE was stretched out ?  ?  ? ?Pertinent Vitals/Pain Pain Assessment ?Pain Assessment: Faces ?Faces Pain Scale: Hurts little more ?Pain Location: grimacing with mobility, B LE ?Pain Descriptors / Indicators: Discomfort, Guarding, Grimacing ?Pain Intervention(s): Repositioned, Monitored during session  ? ? ? ?PT Goals (current goals can now be found in the care plan section) Acute Rehab PT Goals ?PT Goal Formulation: With patient/family ?Time For Goal Achievement: 04/16/22 ?Potential to Achieve Goals: Good ?Progress towards PT goals: Progressing toward goals ? ?  ?Frequency ? ? ? Min 3X/week ? ? ? ?  ?PT Plan Current plan remains appropriate  ? ? ?   ?AM-PAC PT "6 Clicks" Mobility   ?Outcome Measure ? Help needed turning from your back to your side while in a flat bed without using bedrails?: Total ?Help needed moving from lying on your back to sitting on the side of a flat bed without using bedrails?: Total ?Help needed moving to and from a bed to a chair (including a wheelchair)?: Total ?Help needed standing up from a chair using your arms (e.g., wheelchair or bedside chair)?: Total ?Help needed to walk in hospital room?: Total ?Help needed climbing 3-5 steps with a railing? : Total ?6 Click Score: 6 ? ?  ?End of Session   ?Activity Tolerance: Patient limited by lethargy ?Patient left: with call bell/phone within reach;with family/visitor present;in bed;with bed alarm set ?Nurse  Communication: Mobility status ?PT Visit Diagnosis: Muscle weakness (generalized) (M62.81) ?  ? ? ?Time: 7253-6644 ?PT Time Calculation (min) (ACUTE ONLY): 32 min ? ?Charges:  $Therapeutic Activity: 8-22 mins ?$Neuromuscular Re-education: 8-22 mins          ?          ? ?Lorie Apley, SPT ?Acute Rehab Services ? ? ? ?Lorie Apley ?04/07/2022, 4:30 PM ? ?

## 2022-04-07 NOTE — Progress Notes (Signed)
Occupational Therapy Treatment ?Patient Details ?Name: Donald [name] ?MRN: [id number] ?DOB: 02/19/1956 ?Today's Date: 04/07/2022 ? ? ?History of present illness Pt is a 66yo male who was brought to ED for seizure like activity and was found to have had seizures. 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  ?OT comments ? Patient supine in bed with spouse at side.  Agreeable to transfer to recliner to finish lunch.  Requires total assist for bed mobility and donning socks at bed level.  Patient maintaining sitting balance at EOB with min guard assist, attempted using stedy to stand with total assist (spouse assisting as well) with poor ability to fully ascend.  Noted incontinent of bowel upon standing and returned back to supine to complete hygiene and linen change with total assist.  Patient with limited verbalizations and engagement during session, pt responds to a question when I utilized his name first but otherwise doesn't engage with therapist.  Discussed AIR recommendations, spouse reports IF she does take him home she will have +2 assist for transfers.  I question if spouse is truly understanding level of care patient requires at this time as she refers to what he could do prior to admission often. Based on performance today, I believe AIR would benefit to optimize strength and tolerance to reduce burden of care upon dc home.   ? ?Recommendations for follow up therapy are one component of a multi-disciplinary discharge planning process, led by the attending physician.  Recommendations may be updated based on patient status, additional functional criteria and insurance authorization. ?   ?Follow Up Recommendations ? Acute inpatient rehab (3hours/day) (maximal HH services if family decides to take him home (HHOT, aide))  ?  ?Assistance Recommended at Discharge Frequent or constant Supervision/Assistance  ?Patient can return home with the following ? Two people to  help with walking and/or transfers;Two people to help with bathing/dressing/bathroom;Assistance with cooking/housework;Assistance with feeding;Direct supervision/assist for medications management;Direct supervision/assist for financial management;Assist for transportation;Help with stairs or ramp for entrance ?  ?Equipment Recommendations ? None recommended by OT  ?  ?Recommendations for Other Services Rehab consult ? ?  ?Precautions / Restrictions Precautions ?Precautions: Fall ?Precaution Comments: seizure ?Required Braces or Orthoses: Splint/Cast ?Splint/Cast: pt's wife reports pt has two different day-time splints for his L hand/wrist (asked wife to bring them acutely) ?Restrictions ?Weight Bearing Restrictions: No  ? ? ?  ? ?Mobility Bed Mobility ?Overal bed mobility: Needs Assistance ?Bed Mobility: Supine to Sit, Sit to Supine ?  ?  ?Supine to sit: Total assist ?Sit to supine: Total assist ?  ?General bed mobility comments: once sitting pt able to sustain sitting balance with close min G and RUE supported ?  ? ?Transfers ?  ?  ?  ?  ?  ?  ?  ?  ?  ?  ?  ?  ?Balance Overall balance assessment: Needs assistance ?Sitting-balance support: Feet supported, Single extremity supported ?Sitting balance-Leahy Scale: Poor ?Sitting balance - Comments: progressing from min to min guard with R UE support at EOB ?  ?  ?Standing balance-Leahy Scale: Zero ?  ?  ?  ?  ?  ?  ?  ?  ?  ?  ?  ?  ?   ? ?ADL either performed or assessed with clinical judgement  ? ?ADL Overall ADL's : Needs assistance/impaired ?  ?  ?  ?  ?  ?  ?  ?  ?  ?  ?  ?  ?  ?  Toilet Transfer Details (indicate cue type and reason): attempted standing with stedy, pt requires total assist +2 to ascend partially 2x with poor ability to sustain upright position.  Returned back to bed due to incontience of bowel. ?Toileting- Architect and Hygiene: Total assistance;+2 for physical assistance;+2 for safety/equipment;Bed level ?Toileting - Clothing  Manipulation Details (indicate cue type and reason): bowel incontience ?  ?  ?Functional mobility during ADLs: Total assistance ?  ?  ? ?Extremity/Trunk Assessment   ?  ?  ?  ?  ?  ? ?Vision   ?  ?  ?Perception   ?  ?Praxis   ?  ? ?Cognition Arousal/Alertness: Lethargic ?Behavior During Therapy: Flat affect ?Overall Cognitive Status: Impaired/Different from baseline ?Area of Impairment: Attention, Following commands, Awareness, Problem solving ?  ?  ?  ?  ?  ?  ?  ?  ?  ?Current Attention Level: Focused ?  ?Following Commands: Follows one step commands inconsistently, Follows one step commands with increased time ?  ?Awareness: Intellectual ?Problem Solving: Slow processing, Decreased initiation, Difficulty sequencing, Requires verbal cues ?General Comments: pt initally awake but fades quickly, lethargic and limited engagement. pt follow simple commands inconsistently, poor awarness to deficits and problem solving. ?  ?  ?   ?Exercises   ? ?  ?Shoulder Instructions   ? ? ?  ?General Comments    ? ? ?Pertinent Vitals/ Pain       Pain Assessment ?Pain Assessment: Faces ?Faces Pain Scale: Hurts little more ?Pain Location: grimacing with mobility, esp L LE ?Pain Descriptors / Indicators: Discomfort, Guarding, Grimacing ?Pain Intervention(s): Limited activity within patient's tolerance, Monitored during session, Repositioned ? ?Home Living   ?  ?  ?  ?  ?  ?  ?  ?  ?  ?  ?  ?  ?  ?  ?  ?  ?  ?  ? ?  ?Prior Functioning/Environment    ?  ?  ?  ?   ? ?Frequency ? Min 2X/week  ? ? ? ? ?  ?Progress Toward Goals ? ?OT Goals(current goals can now be found in the care plan section) ? Progress towards OT goals: Progressing toward goals ? ?Acute Rehab OT Goals ?Patient Stated Goal: none stated ?OT Goal Formulation: With patient/family ?Time For Goal Achievement: 04/16/22 ?Potential to Achieve Goals: Good  ?Plan Discharge plan remains appropriate;Frequency remains appropriate   ? ?Co-evaluation ? ? ?   ?  ?  ?  ?  ? ?  ?AM-PAC OT  "6 Clicks" Daily Activity     ?Outcome Measure ? ? Help from another person eating meals?: A Lot ?Help from another person taking care of personal grooming?: A Lot ?Help from another person toileting, which includes using toliet, bedpan, or urinal?: Total ?Help from another person bathing (including washing, rinsing, drying)?: Total ?Help from another person to put on and taking off regular upper body clothing?: Total ?Help from another person to put on and taking off regular lower body clothing?: Total ?6 Click Score: 8 ? ?  ?End of Session Equipment Utilized During Treatment: Other (comment) (stedy) ? ?OT Visit Diagnosis: Unsteadiness on feet (R26.81);Other abnormalities of gait and mobility (R26.89);Muscle weakness (generalized) (M62.81);Hemiplegia and hemiparesis ?Hemiplegia - Right/Left: Left ?Hemiplegia - dominant/non-dominant: Non-Dominant ?Hemiplegia - caused by: Cerebral infarction ?  ?Activity Tolerance Patient limited by fatigue ?  ?Patient Left with call bell/phone within reach;in bed;with bed alarm set;with family/visitor present ?  ?Nurse Communication Mobility status;Other (comment) (+  BM) ?  ? ?   ? ?Time: 7262-0355 ?OT Time Calculation (min): 31 min ? ?Charges: OT General Charges ?$OT Visit: 1 Visit ?OT Treatments ?$Self Care/Home Management : 23-37 mins ? ?Barry Brunner, OT ?Acute Rehabilitation Services ?Pager [phone] ?Office [phone] ? ? ?Chancy Milroy ?04/07/2022, 2:11 PM ?

## 2022-04-07 NOTE — Discharge Summary (Signed)
?Discharge Summary ? ?Donald [name] GLO:[id number] DOB: [birth year] ? ?PCP: [name], MD ? ?Admit date: 03/29/2022 ?Discharge date: 04/07/2022 ? ?Time spent: 35 minutes. ? ?Recommendations for Outpatient Follow-up:  ?Follow-up with neurology, Dr. Pearlean Brownie ?Follow-up with your primary care provider ?Take your medications as prescribed ?Continue PT OT with assistance and fall precautions ?Seizure precautions ?Do not drive or operate heavy machinery until cleared by neurology. ? ?Discharge Diagnoses:  ?Active Hospital Problems  ? Diagnosis Date Noted  ? Seizure (HCC) 05/23/2021  ?  Priority: 1.  ? Dementia (HCC) 03/29/2022  ? H/O: CVA (cerebrovascular accident) 03/29/2022  ? 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 Prediabetes 05/30/2021  ? OSA (obstructive sleep apnea)   ?  ?Resolved Hospital Problems  ?No resolved problems to display.  ? ? ?Discharge Condition: Stable ? ?Diet recommendation: Resume previous diet. ? ?Vitals:  ? 04/07/22 1232 04/07/22 1548  ?BP: (!) 166/71 115/69  ?Pulse: 88 88  ?Resp: 16 18  ?Temp:  98.6 ?F (37 ?C)  ?SpO2:  100%  ? ? 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  Was brought into the hospital with twitching, jerking and rolling of the eyes that lasted 2 to 3 minutes.  Patient was admitted to the hospital for breakthrough seizures.  Was seen by neurology.  LTM was positive and the patient was started on Depakote and Vimpat.  Depakote was subsequently changed to Keppra.  Patient was seen by PT OT with recommendation for CIR. insurance denied.  Vital signs and labs were reviewed and are stable.  The patient is medically cleared for discharge. ? ?04/07/2022: Patient was seen and examined at bedside.  There were no acute events overnight.  No complaints.  The Clinical research associate updated his  wife via phone.  She requested to speak with neurology. ? ?Neurology Dr. Pearlean Brownie visited the patient at bedside and also updated his wife at bedside.  Okay to discharge from a neurology standpoint. ? ? ?Hospital Course:  ?Principal Problem: ?  Seizure (HCC) ?Active Problems: ?  OSA (obstructive sleep apnea) ?  Prediabetes ?  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  Dementia (HCC) ?  H/O: CVA (cerebrovascular accident) ? ?New onset seizure disorder ?Resolved encephalopathy ?  ?History of intracranial hemorrhage.  CT head scan this time did not show any acute changes but atrophy.  Neurology was consulted.  EEG showed seizure-like activity.  Patient was started on Depakote 03/29/22 and added Vimpat on 03/30/22.  Depakote was discontinued on 04/02/22 due to supratherapeutic levels, hyperammonemia and somnolence.  Keppra has been initiated.   ?Continue Keppra 250 twice a day and Vimpat 100 mg twice daily as per neurology.    ?Patient is alert awake and communicative and at his baseline mentation.. ?  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 Pearlean Brownie and physiatry Dr. Hermelinda Medicus as outpatient. ?  ?Cognitive deficits, vascular Dementia (HCC) ?-Continue delirium precautions.  Patient is oriented today. ?  ?HTN ?Was on amlodipine as outpatient.  Family reported that he was very sensitive.  Has been started on low-dose while in the hospital at 2.5 mg daily.  Latest blood pressure at 115/69 ?  ?History of sleep apnea.   ?  ?Deconditioning/debility.  Patient has been seen by physical therapy and therapy.  recommend inpatient rehab  at this time.  Family also wishes to CIR.  ?Patient's insurance was denied. ?  ? ?  ?Code Status:   ?  Code Status: Full Code ?  ? ?  ? Family Communication:  ?None at bedside.  Updated his wife via phone. ?  ?Consultants:  ?Neurology ?  ?Procedures:  ?Long-term EEG monitoring ?  ?Antimicrobials:  ?None ?  ? ?Discharge Exam: ?BP 115/69 (BP  Location: Left Wrist)   Pulse 88   Temp 98.6 ?F (37 ?C) (Oral)   Resp 18   Ht 5\' 5"  (1.651 m)   Wt 58.5 kg   SpO2 100%   BMI 21.45 kg/m?  ?General: 66 y.o. year-old male frail-appearing in no acute distress.  Alert and interactive. ?Cardiovascular: Regular rate and rhythm with no rubs or gallops.   ?Respiratory: Clear to auscultation with no wheezes or rales.  ?Abdomen: Soft nontender nondistended with normal bowel sounds. ?Psychiatry: Mood is appropriate for condition and setting ? ?Discharge Instructions ?You were cared for by a hospitalist during your hospital stay. If you have any questions about your discharge medications or the care you received while you were in the hospital after you are discharged, you can call the unit and asked to speak with the hospitalist on call if the hospitalist that took care of you is not available. Once you are discharged, your primary care physician will handle any further medical issues. Please note that NO REFILLS for any discharge medications will be authorized once you are discharged, as it is imperative that you return to your primary care physician (or establish a relationship with a primary care physician if you do not have one) for your aftercare needs so that they can reassess your need for medications and monitor your lab values. ? ? ?Allergies as of 04/07/2022   ? ?   Reactions  ? Sulfa Antibiotics Other (See Comments)  ? Nausea and eye swelling  ? Zanaflex [tizanidine]   ? lethargy  ? ?  ? ?  ?Medication List  ?  ? ?TAKE these medications   ? ?amLODipine 2.5 MG tablet ?Commonly known as: NORVASC ?Take 1 tablet (2.5 mg total) by mouth daily. ?Start taking on: April 08, 2022 ?  ?Lacosamide 100 MG Tabs ?Take 1 tablet (100 mg total) by mouth 2 (two) times daily. ?  ?levETIRAcetam 100 MG/ML solution ?Commonly known as: KEPPRA ?Take 2.5 mLs (250 mg total) by mouth 2 (two) times daily. ?  ?multivitamin tablet ?Take 1 tablet by mouth daily. ?  ?polyethylene glycol 17 g  packet ?Commonly known as: MIRALAX / GLYCOLAX ?Take 17 g by mouth daily. ?Start taking on: April 08, 2022 ?  ?zinc gluconate 50 MG tablet ?Take 50 mg by mouth daily. ?  ? ?  ? ?Allergies  ?Allergen Reactions  ? Sulfa Antibiotics Other (See Comments)  ?  Nausea and eye swelling ?  ? Zanaflex [Tizanidine]   ?  lethargy  ? ? ? ? ?The results of significant diagnostics from this hospitalization (including imaging, microbiology, ancillary and laboratory) are listed below for reference.   ? ?Significant Diagnostic Studies: ?CT Head Wo Contrast ? ?Result Date: 03/29/2022 ?CLINICAL DATA:  New onset seizure. EXAM: CT HEAD WITHOUT CONTRAST TECHNIQUE: Contiguous axial images were obtained from the base of the skull through the vertex without intravenous contrast. RADIATION DOSE REDUCTION: This exam was performed according to the departmental dose-optimization program which includes automated exposure control, adjustment of the mA and/or kV according to patient size and/or use  of iterative reconstruction technique. COMPARISON:  03/27/2022 FINDINGS: Brain: No evidence of intracranial hemorrhage, acute infarction, acute hydrocephalus, extra-axial collection, or mass lesion/mass effect. An old high right parietal lobe infarct is again seen near the convexity, with ex vacuo dilatation of the right lateral ventricle. 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 Mild cerebral atrophy without change. Vascular:  No hyperdense vessel or other acute findings. Skull: No evidence of fracture or other significant bone abnormality. Sinuses/Orbits:  No acute findings. Other: None. IMPRESSION: No acute intracranial abnormality. Stable old high right parietal lobe infarct. Stable cerebral atrophy and chronic small vessel disease. Electronically Signed   By: Danae Orleans M.D.   On: 03/29/2022 09:28  ? ?CT Head Wo Contrast ? ?Result Date: 03/27/2022 ?CLINICAL DATA:  Seizure EXAM: CT HEAD WITHOUT CONTRAST TECHNIQUE: Contiguous  axial images were obtained from the base of the skull through the vertex without intravenous contrast. RADIATION DOSE REDUCTION: This exam was performed according to the departmental dose-optimization program

## 2022-04-07 NOTE — Progress Notes (Signed)
Inpatient Rehab Admissions Coordinator:  ?Received a denial from insurance. Pt's wife has been informed. Peer to peer is being offered until May 1st. Dr. Margo Aye aware. Will continue to follow. ? ? ?Wolfgang Phoenix, MS, CCC-SLP ?Admissions Coordinator ?[phone] ? ?

## 2022-04-08 ENCOUNTER — Other Ambulatory Visit (HOSPITAL_BASED_OUTPATIENT_CLINIC_OR_DEPARTMENT_OTHER): Payer: Self-pay

## 2022-04-08 ENCOUNTER — Other Ambulatory Visit (HOSPITAL_COMMUNITY): Payer: Self-pay

## 2022-04-08 MED ORDER — LACOSAMIDE 100 MG PO TABS
ORAL_TABLET | ORAL | 0 refills | Status: DC
  Filled 2022-04-08: qty 60, 30d supply, fill #0

## 2022-04-08 MED ORDER — LEVETIRACETAM 100 MG/ML PO SOLN
ORAL | 0 refills | Status: DC
  Filled 2022-04-08: qty 450, 90d supply, fill #0

## 2022-04-14 ENCOUNTER — Encounter: Payer: Self-pay | Admitting: Neurology

## 2022-04-14 ENCOUNTER — Telehealth: Payer: Self-pay | Admitting: Neurology

## 2022-04-14 ENCOUNTER — Ambulatory Visit (INDEPENDENT_AMBULATORY_CARE_PROVIDER_SITE_OTHER): Payer: No Typology Code available for payment source | Admitting: Neurology

## 2022-04-14 VITALS — BP 112/76 | HR 92

## 2022-04-14 DIAGNOSIS — G40109 Localization-related (focal) (partial) symptomatic epilepsy and epileptic syndromes with simple partial seizures, not intractable, without status epilepticus: Secondary | ICD-10-CM

## 2022-04-14 DIAGNOSIS — G8110 Spastic hemiplegia affecting unspecified side: Secondary | ICD-10-CM

## 2022-04-14 DIAGNOSIS — I611 Nontraumatic intracerebral hemorrhage in hemisphere, cortical: Secondary | ICD-10-CM

## 2022-04-14 MED ORDER — LEVETIRACETAM 100 MG/ML PO SOLN: 250.0000 mg | Freq: Two times a day (BID) | ORAL | 0 refills | Status: DC

## 2022-04-14 MED ORDER — LACOSAMIDE 100 MG PO TABS
100.0000 mg | ORAL_TABLET | Freq: Two times a day (BID) | ORAL | 3 refills | Status: DC
Start: 2022-04-14 — End: 2022-05-10

## 2022-04-14 NOTE — Telephone Encounter (Signed)
Aaron from [facility] said he will take this patient. ?

## 2022-04-14 NOTE — Patient Instructions (Signed)
I had a long discussion with the patient and his wife regarding his recent episodes of focal motor seizures likely symptomatic from his intracerebral hemorrhage and recommend he continue the current antiepileptic regimen of Vimpat 100 mg twice daily and Keppra 250 mg twice daily.  Check EEG for any silent seizures.  Referral for home physical occupational therapy as he has deconditioning following his seizures.  Continue to maintain strict control of hypertension with blood pressure goal below 130/90.  Return for follow-up in the future in 3 months or call earlier if necessary. ? ?Seizure, Adult ?A seizure is a sudden burst of abnormal electrical and chemical activity in the brain. Seizures usually last from 30 seconds to 2 minutes. The abnormal activity temporarily interrupts normal brain function. ?Many types of seizures can affect adults. A seizure can cause many different symptoms depending on where in the brain it starts. ?What are the causes? ?Common causes of this condition include: ?Fever or infection. ?Brain injury, head trauma, bleeding in the brain, or a brain tumor. ?Low levels of blood sugar or salt (sodium). ?Kidney problems or liver problems. ?Metabolic disorders or other conditions that are passed from parent to child (are inherited). ?Reaction to a substance, such as a drug or a medicine, or suddenly stopping the use of a substance (withdrawal). ?A stroke. ?Developmental disorders such as autism spectrum disorder or cerebral palsy. ?In some cases, the cause of a seizure may not be known. Some people who have a seizure never have another one. A person who has repeated seizures over time without a clear cause has a condition called epilepsy. ?What increases the risk? ?You are more likely to develop this condition if: ?You have a family history of epilepsy. ?You have had a tonic-clonic seizure before. This type of seizure causes tightening (contraction) of the muscles of the whole body and loss of  consciousness. ?You have a history of head trauma, lack of oxygen at birth, or strokes. ?What are the signs or symptoms? ?There are many different types of seizures. The symptoms vary depending on the type of seizure you have. Symptoms occur during the seizure. They may also occur before a seizure (aura) and after a seizure (postictal). Symptoms may include the following: ?Symptoms during a seizure ?Uncontrollable shaking (convulsions) with fast, jerky movements of muscles. ?Stiffening of the body. ?Breathing problems. ?Confusion, staring, or unresponsiveness. ?Head nodding, eye blinking or fluttering, or rapid eye movements. ?Drooling, grunting, or making clicking sounds with your mouth. ?Loss of bladder control and bowel control. ?Symptoms before a seizure ?Fear or anxiety. ?Nausea. ?Vertigo. This is a feeling like: ?You are moving when you are not. ?Your surroundings are moving when they are not. ?D?j? vu. This is a feeling of having seen or heard something before. ?Odd tastes or smells. ?Changes in vision, such as seeing flashing lights or spots. ?Symptoms after a seizure ?Confusion. ?Sleepiness. ?Headache. ?Sore muscles. ?How is this diagnosed? ?This condition may be diagnosed based on: ?A description of your symptoms. Video of your seizures can be helpful. ?Your medical history. ?A physical exam. ?You may also have tests, including: ?Blood tests. ?CT scan. ?MRI. ?Electroencephalogram (EEG). This test measures electrical activity in the brain. An EEG can predict whether seizures will return. ?A spinal tap, also called a lumbar puncture. This is the removal and testing of fluid that surrounds the brain and spinal cord. ?How is this treated? ?Most seizures will stop on their own in less than 5 minutes, and no treatment is needed. Seizures that  last longer than 5 minutes will usually need treatment. ?Seizures may be treated with: ?Medicines given through an IV. ?Avoiding known triggers, such as medicines that you  take for another condition. ?Medicines to control seizures or prevent future seizures (antiepileptics), if epilepsy caused your seizures. ?Medical devices to prevent and control seizures. ?Surgery to stop seizures or to reduce how often seizures happen, if you have epilepsy that does not respond to medicines. ?A diet low in carbohydrates and high in fat (ketogenic diet). ?Follow these instructions at home: ?Medicines ?Take over-the-counter and prescription medicines only as told by your health care provider. ?Avoid any substances that may prevent your medicine from working properly, such as alcohol. ?Activity ?Follow instructions about activities, such as driving or swimming, that would be dangerous if you had another seizure. Wait until your health care provider says it is safe to do them. ?If you live in the [place] check with your local department of motor vehicles (DMV) to find out about local driving laws. Each state has specific rules about when you can legally drive again. ?Get enough rest. Lack of sleep can make seizures more likely to occur. ?Educating others ? ?Teach friends and family what to do if you have a seizure. They should: ?Help you get down to the ground, to prevent a fall. ?Cushion your head and move items away from your body. ?Loosen any tight clothing around your neck. ?Turn you on your side. If you vomit, this helps keep your airway clear. ?Know whether or not you need emergency care. ?Stay with you until you recover. ?Also, tell them what not to do if you have a seizure. Tell them: ?They should not hold you down. Holding you down will not stop the seizure. ?They should not put anything in your mouth. ?General instructions ?Avoid anything that has ever triggered a seizure for you. ?Keep a seizure diary. Record what you remember about each seizure, especially anything that might have triggered it. ?Keep all follow-up visits. This is important. ?Contact a health care provider if: ?You have  another seizure or seizures. Call each time you have a seizure. ?Your seizure pattern changes. ?You continue to have seizures with treatment. ?You have symptoms of an infection or illness. Either of these might increase your risk of having a seizure. ?You are unable to take your medicine. ?Get help right away if: ?You have: ?A seizure that does not stop after 5 minutes. ?Several seizures in a row without a complete recovery between seizures. ?A seizure that makes it harder to breathe. ?A seizure that leaves you unable to speak or use a part of your body. ?You do not wake up right away after a seizure. ?You injure yourself during a seizure. ?You have confusion or pain right after a seizure. ?These symptoms may represent a serious problem that is an emergency. Do not wait to see if the symptoms will go away. Get medical help right away. Call your local emergency services (911 in the [place] Do not drive yourself to the hospital. ?Summary ?Seizures are caused by abnormal electrical and chemical activity in the brain. The activity disrupts normal brain function and can cause various symptoms. ?Seizures have many causes, including illness, head injuries, low levels of blood sugar or salt, and certain conditions. ?Most seizures will stop on their own in less than 5 minutes. Seizures that last longer than 5 minutes are a medical emergency and need treatment right away. ?Many medicines are used to treat seizures. Take over-the-counter and prescription medicines  only as told by your health care provider. ?This information is not intended to replace advice given to you by your health care provider. Make sure you discuss any questions you have with your health care provider. ?Document Revised: 06/05/2020 Document Reviewed: 06/05/2020 ?[employer]vier Patient Education ? [employer]. ? ?

## 2022-04-14 NOTE — Progress Notes (Signed)
?[facility] ?[address] ?[place] [address] ?[phone] ? ?     OFFICE CONSULT NOTE ? ?[name] ?Date of Birth:  12-07-1956 ?Medical Record Number:  [id number]  ? ?Referring MD: Reesa Chew, PA-C ? ?Reason for Referral: Intracerebral hemorrhage ? ?HPI: Donald [name] is a 66 year old African-American male seen today for office consultation visit following intracerebral hemorrhage.  He is accompanied by his wife.  History is obtained from them and review of electronic medical records and I personally reviewed pertinent available imaging films in PACS. Donald [name] is a 66 y.o. male with a medical history significant for dementia with behavioral disturbance and reported OSA not on CPAP per wife who presented to the ED via EMS for evaluation of acute onset of left hemiparesis, right gaze, and left mouth droop on 05/11/2021 Donald [name] had woken up in his normal state of health this morning. He and his wife had intercourse this morning and when she got up to get ready for the day, she noticed that Donald [name] was acting "off". She asked him to get out of bed and he stated that he was unable to move his left side and she noticed that he was looking towards the right so she activated EMS. His initial manual blood pressure at 08:15 on 05/11/2021 was 194/104 and on arrival to the hospital around 09:00 his blood pressure was 122/74. On arrival, Donald [name] was noted to have persistent left hemiparesis, right gaze, neck pain, left mouth droop, and decreased sensation on the left and was immediately taken to CT for stroke evaluation.  ICH score was 0.  NIH stroke scale on admission was 17.  CT head showed a large right frontal parenchymal hemorrhage with small adjacent subarachnoid hemorrhage and cytotoxic edema but no intraventricular extension hydrocephalus.  MRI scan of the brain was also obtained which showed parenchymal hematoma without underlying mass lesion.  CT angiogram of the  head and neck showed no large vessel stenosis, aneurysm or AV malformation.  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  2D echo showed ejection fraction of 65 to 70%.  LDL cholesterol is 140 mg percent.  Hemoglobin A1c of 5.8.  Urine drug screen was negative.  Patient was admitted in the intensive care unit.  Blood pressure slightly controlled.  Subsequently he was transferred out of the unit to inpatient rehab.  On 05/24/2021 he was found to be lethargic and drowsy and sleepy on the rehab floor and repeat CT scan of the head showed more prominent cerebral edema around his recent intracerebral hemorrhage with increased midline shift.  There was also concern for seizures so he was placed on long-term EEG monitoring and started on Keppra.  Several EEGs were obtained and long-term monitoring suggested right-sided cortical dysfunction due to the hemorrhage and severe diffuse encephalopathy but no definite seizures were noted.  He was started on hypertonic saline and was also found to be encephalopathy due to combination of cerebral edema, seizure fever and leukocytosis and mild renal insufficiency.  Condition gradually improved.  He subsequently underwent cerebral catheter angiogram on 05/28/2021 which showed again no evidence of aneurysm, AV malformation or vascular stenosis.  Right transverse and sigmoid sinuses had no flow likely due to occlusion.  His mental status showed gradual improvement and Keppra was discontinued and he was started on amantadine to promote wakefulness.  Patient went to inpatient rehab and was discharged home on August 1.  He is living at home with his wife being the sole caregiver.  They do have some help from family members.  Patient is currently getting home physical occupational therapy but is not able to walk.  He has trace movement in his left elbow extension and left feet.  He can help a little bit with  transfers but he can barely stand even with one-person assist from the therapist.  He has had cognitive impairment and memory problems and is slow in processing information though is able to speak clearly but speech is slow and hesitant and is not very talkative.  He has no prior history of strokes, TIAs, seizures or significant neurological problems.  He has no known history of hypertension. ?  ? ?ROS:   ?14 system review of systems is positive for weakness, difficulty walking, speech difficulty, memory loss, cognitive impairment, incontinence and all other systems negative ? ?PMH:  ?Past Medical History:  ?Diagnosis Date  ? Arthritis   ? Stroke [facility])   ? ? ?Social History:  ?Social History  ? ?Socioeconomic History  ? Marital status: Married  ?  Spouse name: Not on file  ? Number of children: Not on file  ? Years of education: Not on file  ? Highest education level: Not on file  ?Occupational History  ? Not on file  ?Tobacco Use  ? Smoking status: Never  ? Smokeless tobacco: Never  ?Vaping Use  ? Vaping Use: Never used  ?Substance and Sexual Activity  ? Alcohol use: No  ? Drug use: No  ? Sexual activity: Yes  ?  Birth control/protection: None  ?Other Topics Concern  ? Not on file  ?Social History Narrative  ? Not on file  ? ?Social Determinants of Health  ? ?Financial Resource Strain: Not on file  ?Food Insecurity: Not on file  ?Transportation Needs: Not on file  ?Physical Activity: Not on file  ?Stress: Not on file  ?Social Connections: Not on file  ?Intimate Partner Violence: Not on file  ? ? ?Medications:   ?Current Outpatient Medications on File Prior to Visit  ?Medication Sig Dispense Refill  ? amLODipine (NORVASC) 2.5 MG tablet Take 1 tablet (2.5 mg total) by mouth daily. 90 tablet 0  ? Multiple Vitamin (MULTIVITAMIN) tablet Take 1 tablet by mouth daily.    ? polyethylene glycol (MIRALAX / GLYCOLAX) 17 g packet Take 17 g by mouth daily. 14 each 0  ? zinc gluconate 50 MG tablet Take 50 mg by mouth daily.     ? ?No current facility-administered medications on file prior to visit.  ? ? ?Allergies:   ?Allergies  ?Allergen Reactions  ? Sulfa Antibiotics Other (See Comments)  ?  Nausea and eye swelling ?  ? Zanaflex [Tizanidine]   ?  lethargy  ? ? ?Physical Exam ?General: Frail malnourished looking middle-aged African-American male sitting in a wheelchair., seated, in no evident distress ?Head: head normocephalic and atraumatic.   ?Neck: supple with no carotid or supraclavicular bruits ?Cardiovascular: regular rate and rhythm, no murmurs ?Musculoskeletal: no deformity ?Skin:  no rash/petichiae ?Vascular:  Normal pulses all extremities ? ?Neurologic Exam ?Mental Status: Awake and fully alert.  Not very cooperative for detailed cognitive testing.  Follows only simple midline and one-step commands.'s speech is nonfluent and speaks only few words and short sentences but no dysarthria.   ?Cranial Nerves: Fundoscopic exam reveals sharp disc margins. Pupils equal, briskly reactive to light. Extraocular movements show right gaze preference but able to look to the  left past midline but not all the way.  Does not blink to threat on the left but does so on the right.  Hearing intact. Facial sensation intact.  Left lower facial weakness., tongue, palate moves normally and symmetrically.  ?Motor: Spastic left hemiplegia with 1/5 strength in left upper extremity with mostly strength only in elbow extension and trace movement in the left lower extremities.  Tone is increased on the left.  Normal strength on the right. ?Sensory.: intact to touch , pinprick , position and vibratory sensation on the right and decreased on the left.  ?Coordination: Preserved on the right and not testable on the left.   ?\Gait and Station: Not tested as patient is unable to even stand. ?Reflexes: 1+ and asymmetric. Toes downgoing.  ? ?N2HSS  15 ?Modified Rankin  415 ? ? ?ASSESSMENT: 66 year old African-American male with right frontal parenchymal intracerebral  hemorrhage of indeterminate cause in March 2022 with significant residual spastic hemiplegia and cognitive impairment.  No history of hypertension or drug abuse and neurovascular imaging was unremarkable ?New

## 2022-04-18 ENCOUNTER — Telehealth: Payer: Self-pay | Admitting: Neurology

## 2022-04-18 NOTE — Telephone Encounter (Signed)
PT Donald [name] with [facility] requesting PT Treatment for 1 to 2 times a week for 8 weeks ?OT eval and Home Health aid ?PT Donald [name]'s vm is secure ?

## 2022-04-18 NOTE — Telephone Encounter (Signed)
Returned call to [facility] w/ [facility]. He will move forward with the orders outlined below to meet the patient's needs. ?

## 2022-04-21 ENCOUNTER — Ambulatory Visit: Payer: No Typology Code available for payment source | Admitting: Neurology

## 2022-04-21 DIAGNOSIS — G40109 Localization-related (focal) (partial) symptomatic epilepsy and epileptic syndromes with simple partial seizures, not intractable, without status epilepticus: Secondary | ICD-10-CM

## 2022-04-26 ENCOUNTER — Ambulatory Visit: Payer: No Typology Code available for payment source | Admitting: Neurology

## 2022-05-10 ENCOUNTER — Other Ambulatory Visit: Payer: Self-pay | Admitting: Neurology

## 2022-05-10 MED ORDER — LEVETIRACETAM 100 MG/ML PO SOLN: 250.0000 mg | Freq: Two times a day (BID) | ORAL | 0 refills | Status: DC

## 2022-05-10 MED ORDER — LACOSAMIDE 100 MG PO TABS: 100.0000 mg | ORAL_TABLET | Freq: Two times a day (BID) | ORAL | 3 refills | Status: DC

## 2022-05-10 MED ORDER — LACOSAMIDE 100 MG PO TABS
100.0000 mg | ORAL_TABLET | Freq: Two times a day (BID) | ORAL | 3 refills | Status: DC
Start: 2022-05-10 — End: 2022-05-10

## 2022-05-10 NOTE — Telephone Encounter (Signed)
Verify Drug Registry For Lacosamide 100 Mg Tablet Last Filled: 04/07/2022 Quantity: 60 tablets for 30 days Last appointment: 04/14/2022 Next appointment: 08/04/2022

## 2022-05-10 NOTE — Addendum Note (Signed)
Addended by: Cristela Felt E on: 05/10/2022 11:06 AM   Modules accepted: Orders

## 2022-05-10 NOTE — Telephone Encounter (Signed)
Pt's wife called stating that the pt is needing a refill on his levETIRAcetam (KEPPRA) 100 MG/ML solution and his Vimpat 100 mg Pt is needing the medications called in to the [facility] on [address].

## 2022-05-10 NOTE — Telephone Encounter (Signed)
Prescriptions sent to [facility] on [address] for refill.

## 2022-05-11 ENCOUNTER — Other Ambulatory Visit: Payer: Self-pay

## 2022-05-11 MED ORDER — LACOSAMIDE 100 MG PO TABS: 100.0000 mg | ORAL_TABLET | Freq: Two times a day (BID) | ORAL | 3 refills | Status: DC

## 2022-05-11 MED ORDER — LEVETIRACETAM 100 MG/ML PO SOLN: 250.0000 mg | Freq: Two times a day (BID) | ORAL | 0 refills | Status: DC

## 2022-05-11 NOTE — Telephone Encounter (Signed)
Patient's medications are not sending to pharmacy. Reprinting for MD to sign.

## 2022-05-11 NOTE — Progress Notes (Signed)
Printed for MD to sign.  Verify Drug Registry For Lacosamide 100 Mg Tablet Last Filled: 04/07/2022 Quantity: 60 tablets for 30 days Last appointment: 04/14/2022 Next appointment: 08/04/2022

## 2022-05-25 ENCOUNTER — Encounter
Payer: No Typology Code available for payment source | Attending: Physical Medicine & Rehabilitation | Admitting: Physical Medicine & Rehabilitation

## 2022-05-25 ENCOUNTER — Encounter: Payer: Self-pay | Admitting: Physical Medicine & Rehabilitation

## 2022-05-25 VITALS — BP 114/73 | HR 73

## 2022-05-25 DIAGNOSIS — S06360S Traumatic hemorrhage of cerebrum, unspecified, without loss of consciousness, sequela: Secondary | ICD-10-CM | POA: Diagnosis not present

## 2022-05-25 DIAGNOSIS — I619 Nontraumatic intracerebral hemorrhage, unspecified: Secondary | ICD-10-CM | POA: Diagnosis not present

## 2022-05-25 DIAGNOSIS — G8114 Spastic hemiplegia affecting left nondominant side: Secondary | ICD-10-CM | POA: Diagnosis not present

## 2022-05-25 DIAGNOSIS — E8809 Other disorders of plasma-protein metabolism, not elsewhere classified: Secondary | ICD-10-CM

## 2022-05-25 DIAGNOSIS — S06310S Contusion and laceration of right cerebrum without loss of consciousness, sequela: Secondary | ICD-10-CM | POA: Diagnosis present

## 2022-05-25 NOTE — Progress Notes (Signed)
Subjective:    Patient ID: Donald [name], male    DOB: 09-07-1956, 66 y.o.   MRN: [id number]  Mr. Bledsoe is here in follow up of his ICH. He was admitted in hospital in April due to having seizures. He's now on keppra and vimpat and seizure free. He's been fairly alert per wife.   We performed botox to his LUE in March which has helped with his rom and pain. Therapy has been working with the limb and he's been using splints as well. His tone is worst in the morning.   His w/c situation is ongoing. Apparently there were delays which carried over to the new year even though paperwork was filed last year. Insurance is asking that she pay down deductible when it was already met when this process was well underway in Fall.   Cognitively, he's near baseline. Bowels and bladder are unchanged.     HPI Pain Inventory Average Pain 0 Pain Right Now 0 My pain is intermittent  LOCATION OF PAIN  shoulder, elbow, wrist, hand, leg  BOWEL Number of stools per week: 35 Oral laxative use No  Type of laxative . Enema or suppository use No  History of colostomy No  Incontinent No   BLADDER Normal In and out cath, frequency condom cathteter Able to self cath Yes  Bladder incontinence No  Frequent urination No  Leakage with coughing No  Difficulty starting stream No  Incomplete bladder emptying No    Mobility ability to climb steps?  no do you drive?  no use a wheelchair needs help with transfers Do you have any goals in this area?  yes  Function disabled: date disabled 5/22 I need assistance with the following:  dressing, bathing, toileting, meal prep, household duties, and shopping  Neuro/Psych weakness spasms confusion  Prior Studies Any changes since last visit?  no  Physicians involved in your care Any changes since last visit?  yes Neurologist new antiseizure medication   Family History  Problem Relation Age of Onset   Hypertension Mother    Colon polyps Neg Hx     Colon cancer Neg Hx    Esophageal cancer Neg Hx    Rectal cancer Neg Hx    Stomach cancer Neg Hx    Social History   Socioeconomic History   Marital status: Married    Spouse name: Not on file   Number of children: Not on file   Years of education: Not on file   Highest education level: Not on file  Occupational History   Not on file  Tobacco Use   Smoking status: Never   Smokeless tobacco: Never  Vaping Use   Vaping Use: Never used  Substance and Sexual Activity   Alcohol use: No   Drug use: No   Sexual activity: Yes    Birth control/protection: None  Other Topics Concern   Not on file  Social History Narrative   Not on file   Social Determinants of Health   Financial Resource Strain: Not on file  Food Insecurity: Not on file  Transportation Needs: Not on file  Physical Activity: Not on file  Stress: Not on file  Social Connections: Not on file   Past Surgical History:  Procedure Laterality Date   APPENDECTOMY     IR ANGIO INTRA EXTRACRAN SEL COM CAROTID INNOMINATE BILAT MOD SED  05/28/2021   IR ANGIO VERTEBRAL SEL VERTEBRAL BILAT MOD SED  05/28/2021   IR IVC FILTER PLMT / S&I Lenise Arena  GUID/MOD SED  05/27/2021   IR US GUIDE VASC ACCESS RIGHT  05/27/2021   Past Medical History:  Diagnosis Date   Arthritis    Stroke ([place])    BP 114/73   Pulse 73   SpO2 98%   Opioid Risk Score:   Fall Risk Score:  `1  Depression screen [facility] 2/9     02/09/2022   11:40 AM 09/08/2021    1:35 PM 08/12/2021   11:18 AM 11/09/2020   11:02 AM 10/26/2020    2:45 PM 09/09/2020    1:42 PM 04/27/2020    2:47 PM  Depression screen PHQ 2/9  Decreased Interest 0 0 1 0 0 0 0  Down, Depressed, Hopeless 0 0 1 0 0 0 0  PHQ - 2 Score 0 0 2 0 0 0 0  Altered sleeping  0 0      Tired, decreased energy  0 1      Change in appetite  0 0      Feeling bad or failure about yourself   0 0      Trouble concentrating  0 1      Moving slowly or fidgety/restless  0 0      Suicidal thoughts   0       PHQ-9 Score  0 4          Review of Systems  Musculoskeletal:        Spasms Shoulder pain Elbow pain Hand pain Leg pain  Neurological:  Positive for weakness.  Psychiatric/Behavioral:  Positive for confusion.   All other systems reviewed and are negative.     Objective:   Physical Exam General: No acute distress HEENT: NCAT, EOMI, oral membranes moist Cards: reg rate  Chest: normal effort Abdomen: Soft, NT, ND Skin: dry, intact Extremities: no edema Psych: pleasant and appropriate  Skin: intact Neuro: sleepy, does communicate. Limited insight  Speech is dysarthric but clearer.  Patient able to follow simple one-step commands.  better siting posture  Left sternocleidomastoid 1-2 out of 68m, left biceps and brachioradialis is 1-2 out of 4.  He also has hamstring tightness 3/4.  Left upper extremity 0 out of 5 left lower extremity 0-5 in regard to motor function.  Patient remains sensitive to touch in the left upper extremity.  less pain with rom but still tender.  Musculoskeletal:  LUE tender with PROM           Assessment & Plan:    1.  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              -continue with HH therapies                -Awaiting power wheelchair--it might be later in the year now                 2.  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 Low back pain, left hemiplegic shoulder, ?HO left hip,/neuropathic pain:     4.  Spastic left hemiparesis Continue aggressive ROM, HEP,  therapies - neck roll to help support his head . -will arrange for botox 400u left biceps, brachioradialis, finger flexors.   -continue ROM/splinting -I reassured his wife that botox didn't contribute to his seizures 5. Nutrition: eating well 6. Bowels and bladder: condom cath, incontinence mgt     15 minutes of face  to face patient care time were  spent during this visit. All questions were encouraged and answered.

## 2022-05-25 NOTE — Patient Instructions (Addendum)
PLEASE FEEL FREE TO CALL OUR OFFICE WITH ANY PROBLEMS OR QUESTIONS [phone])   CONTINUE WITH REGULAR RANGE OF MOTION AND STRETCHING EXERCISES AT HOME.

## 2022-05-30 ENCOUNTER — Telehealth: Payer: Self-pay | Admitting: Neurology

## 2022-05-30 NOTE — Telephone Encounter (Signed)
Rescheduled 08/24 appt with pt's wife over the phone- MD out.

## 2022-06-16 ENCOUNTER — Telehealth: Payer: Self-pay | Admitting: Neurology

## 2022-06-16 NOTE — Telephone Encounter (Signed)
Dorian from [facility] is calling to request 6-8 more week of Physical Therapy orders for Pt. Dorian is  just requesting a call back at [phone].

## 2022-06-17 NOTE — Telephone Encounter (Signed)
I returned the call to Dorian at [facility] to confirm 6-8 additional weeks of PT to meet patient's needs. Left message on voicemail with out phone and fax numbers.

## 2022-07-30 IMAGING — DX DG ABDOMEN 1V
1 series · 2 of 2 positions shown · non-contrast
Comparison: None.

CLINICAL DATA: Abdominal pain.

EXAM:
ABDOMEN - 1 VIEW

[Series 1: abdomen · 0.14mm/px · 2 of 2 slices shown]
[im 1/2]
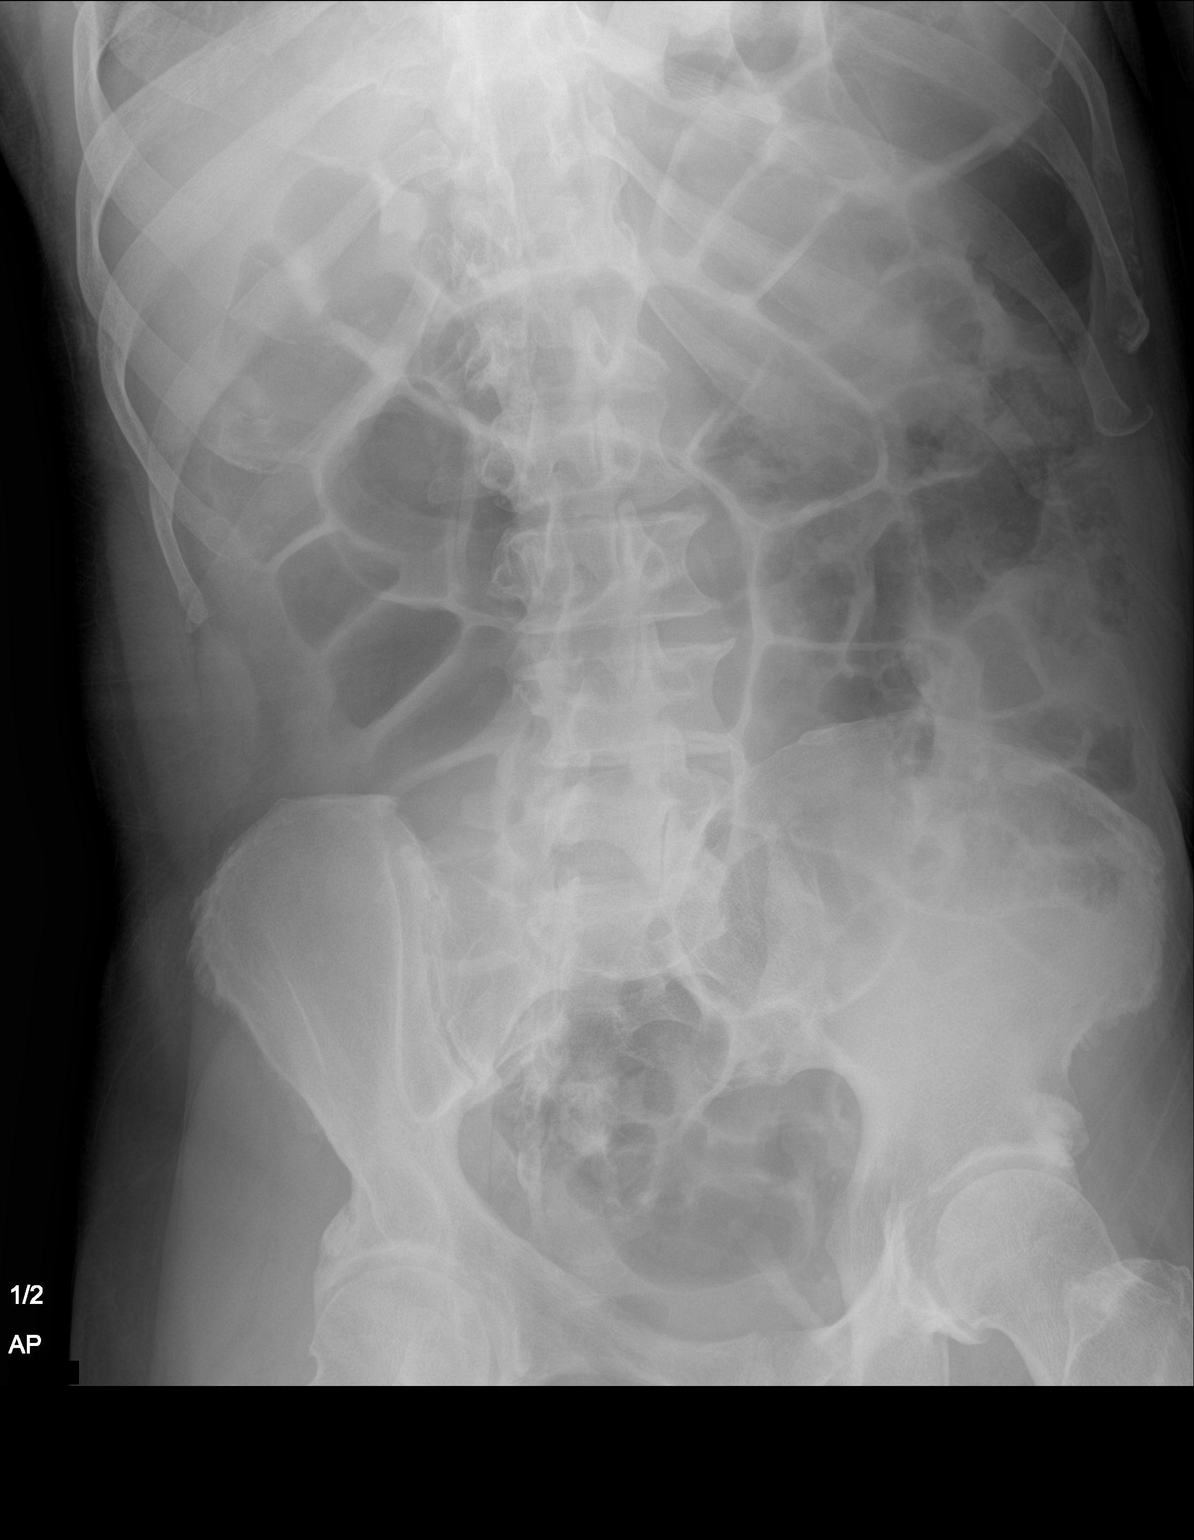
[im 2/2]
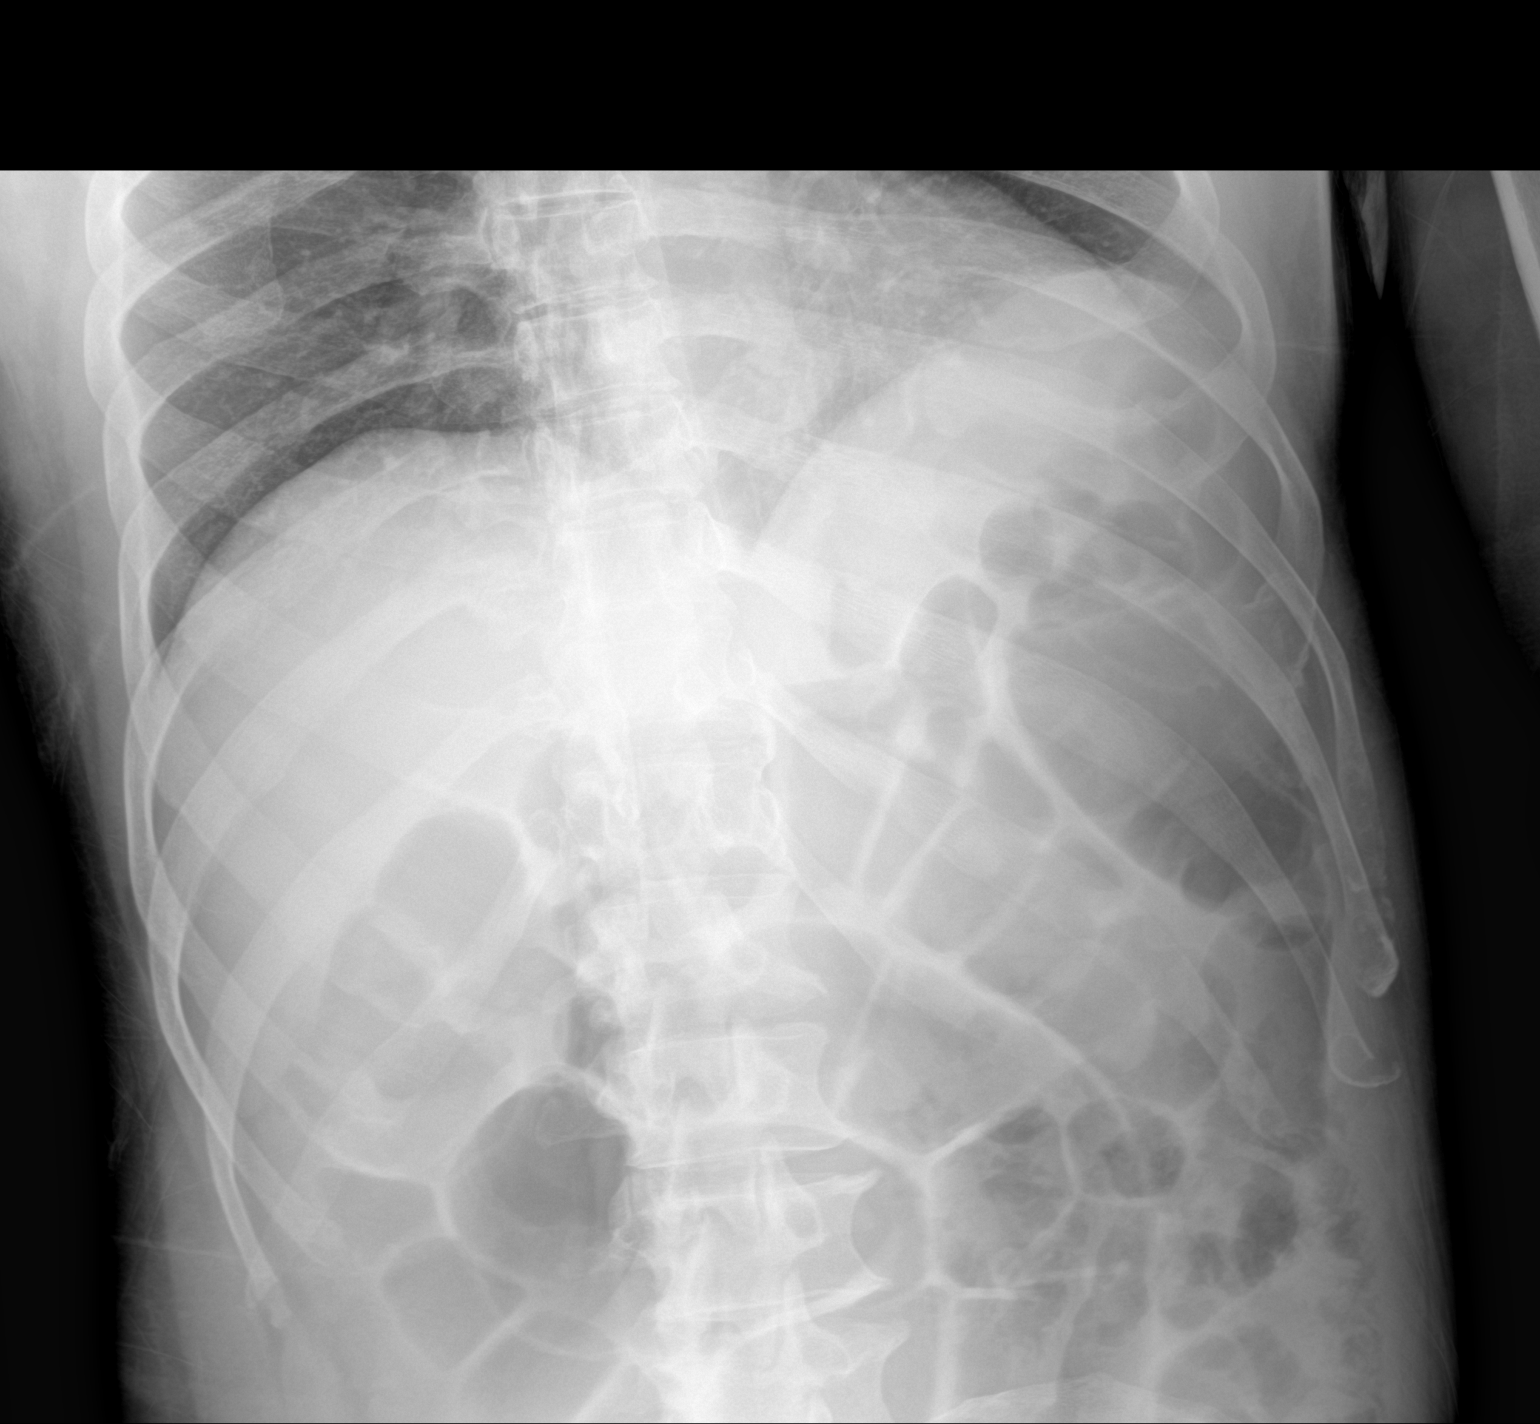

[2 of 2 positions shown; findings below may reference images not displayed]

FINDINGS: Large amount of intestinal gas throughout small and large bowel
suggesting ileus. There does not appear to be a large amount of
fecal matter. No focal obstruction is evident. No free air. No
abnormal calcifications or significant bone findings.
IMPRESSION: Large amount of intestinal gas suggesting ileus.

## 2022-08-02 IMAGING — CT CT HEAD CODE STROKE
4 series · 16 of 47 positions shown, 18 images · non-contrast
Comparison: Head CT from 05/11/2021

CLINICAL DATA: Code stroke.  Acute stroke.

EXAM:
CT HEAD WITHOUT CONTRAST
TECHNIQUE: Contiguous axial images were obtained from the base of the skull
through the vertex without intravenous contrast.

[Series 3: head without · axial · non-contrast · 0.49mm/px · z∈[-21,+104]mm · 7 of 35 slices shown, 9 images]
[im 5/35  brain]
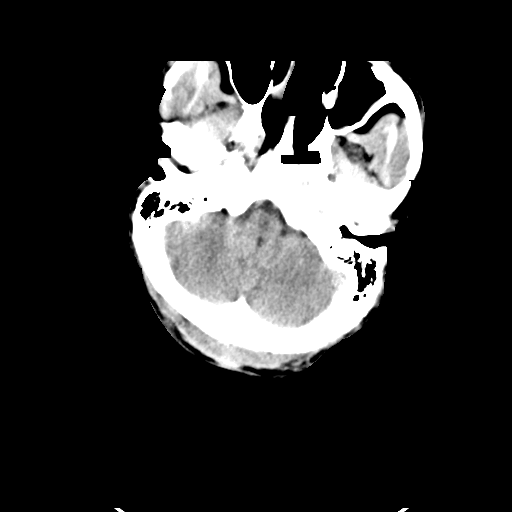
[im 5/35  bone]
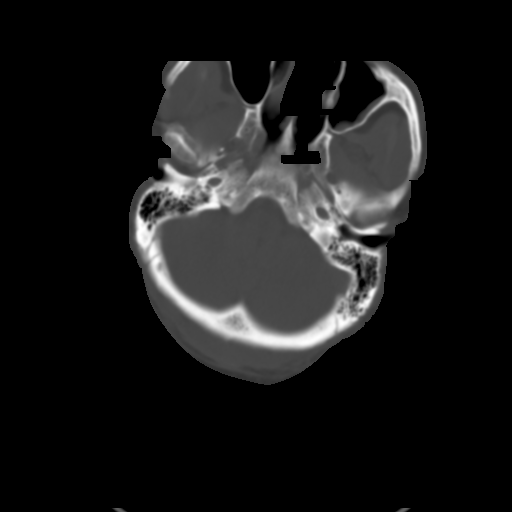
[im 9/35  brain]
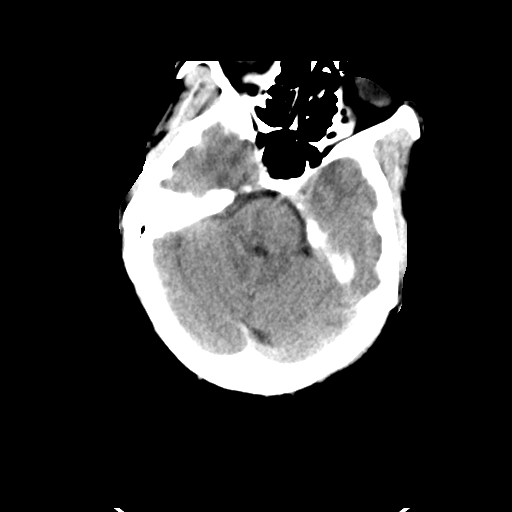
[im 13/35  brain]
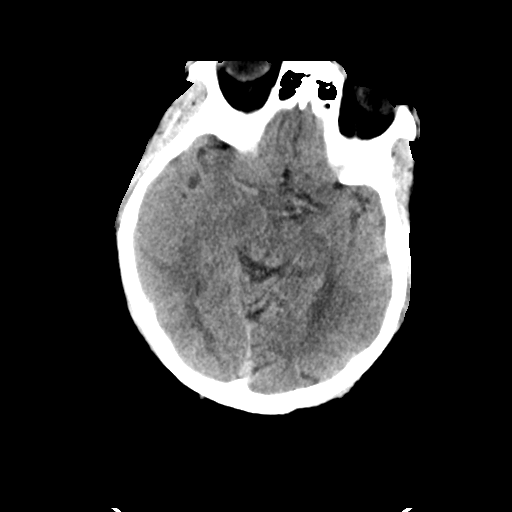
[im 18/35  brain]
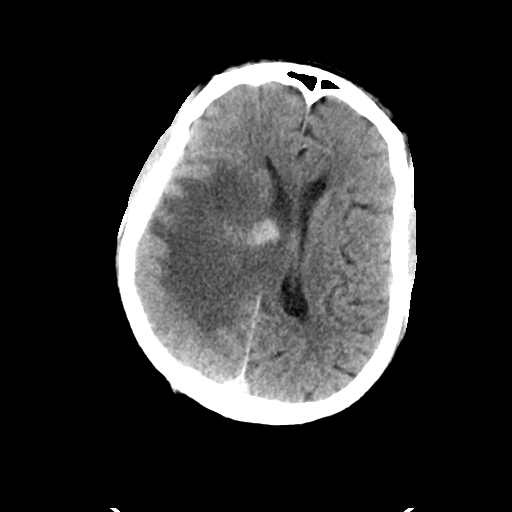
[im 22/35  brain]
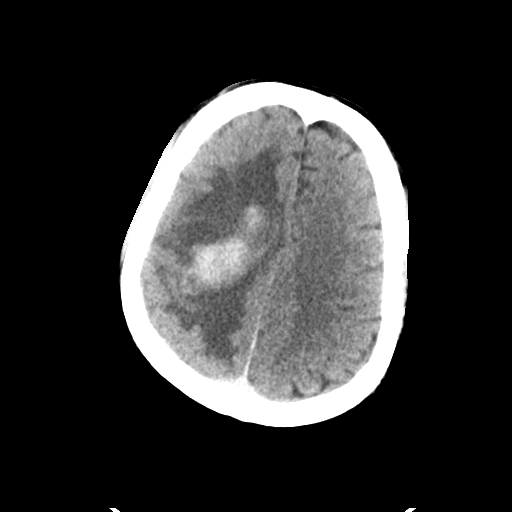
[im 22/35  bone]
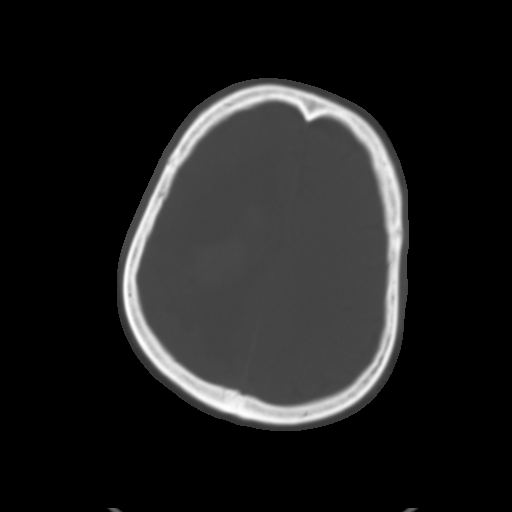
[im 26/35  brain]
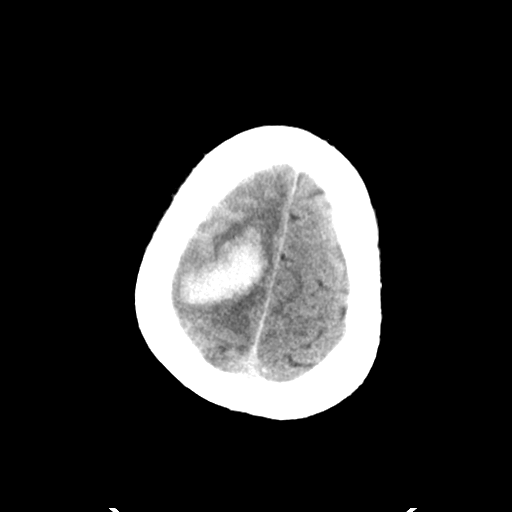
[im 30/35  brain]
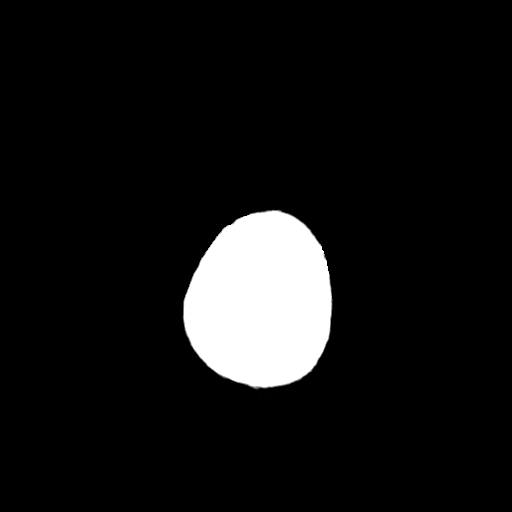

[Series 4: ax head bone · axial · 0.49mm/px · z∈[-10,+21]mm · 3 of 86 slices shown]
[im 9/86  bone]
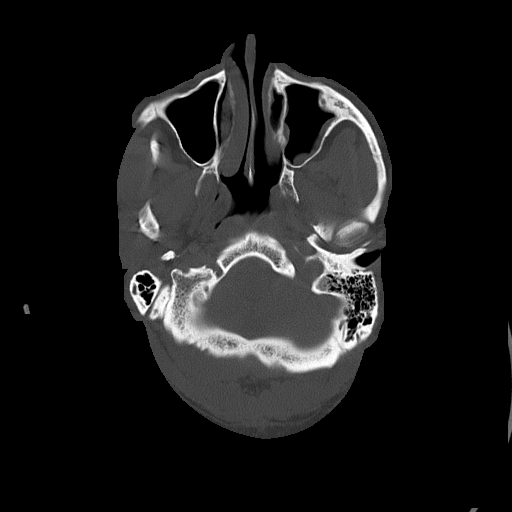
[im 18/86  bone]
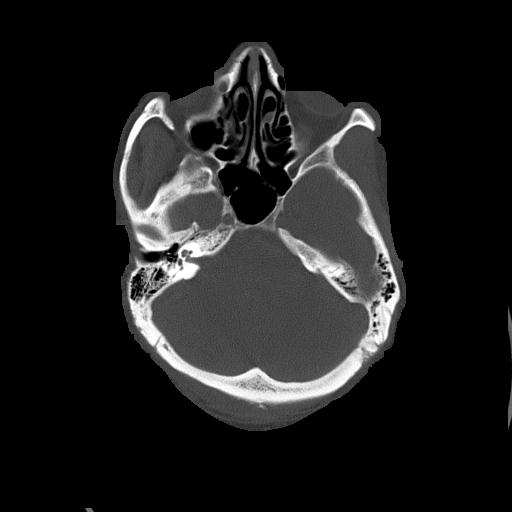
[im 26/86  bone]
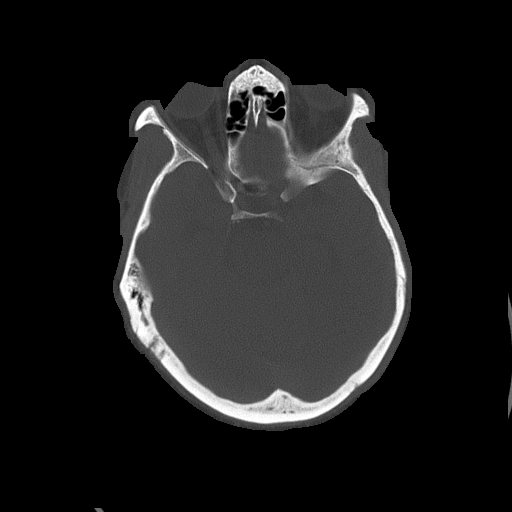

[Series 5: head without cor · coronal · non-contrast · 0.33mm/px · 3 of 63 slices shown]
[im 21/63  brain]
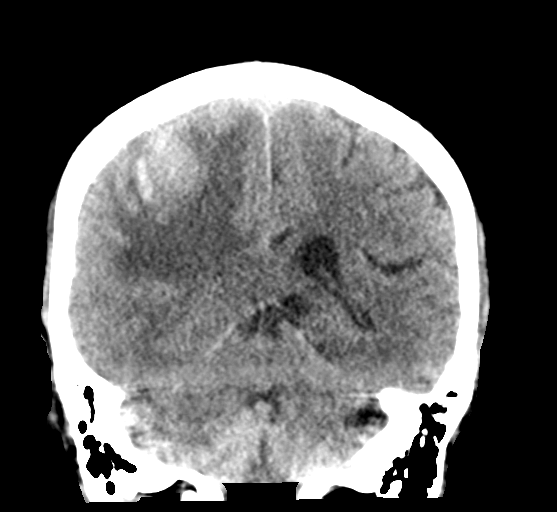
[im 28/63  brain]
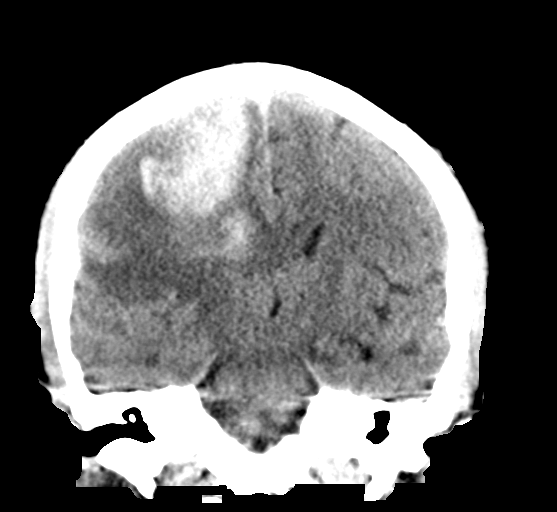
[im 35/63  brain]
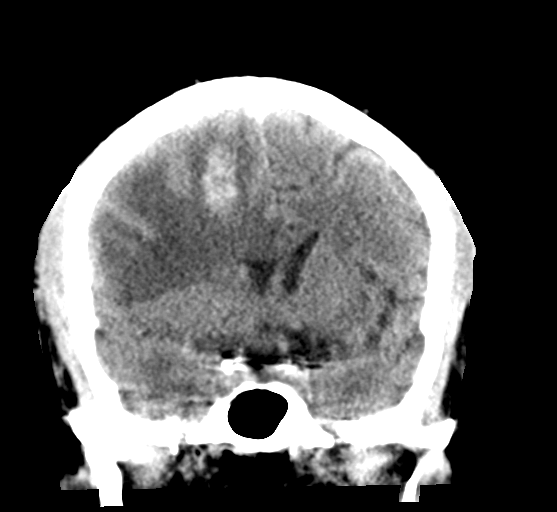

[Series 6: head without sag · sagittal · non-contrast · 0.33mm/px · 3 of 54 slices shown]
[im 18/54  brain]
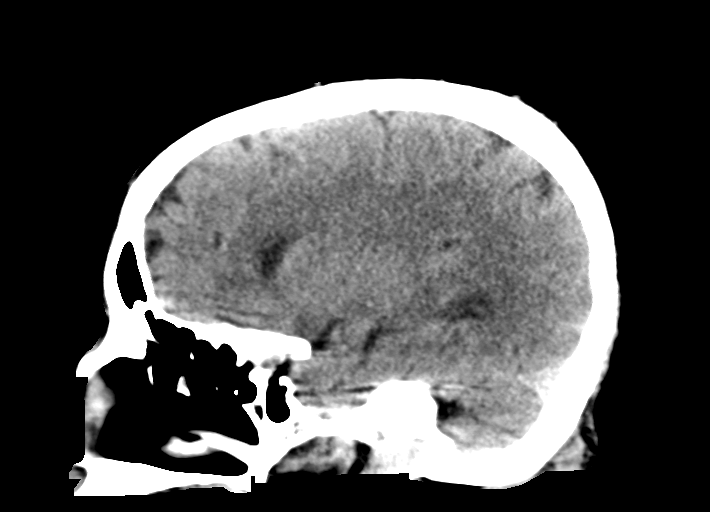
[im 27/54  brain]
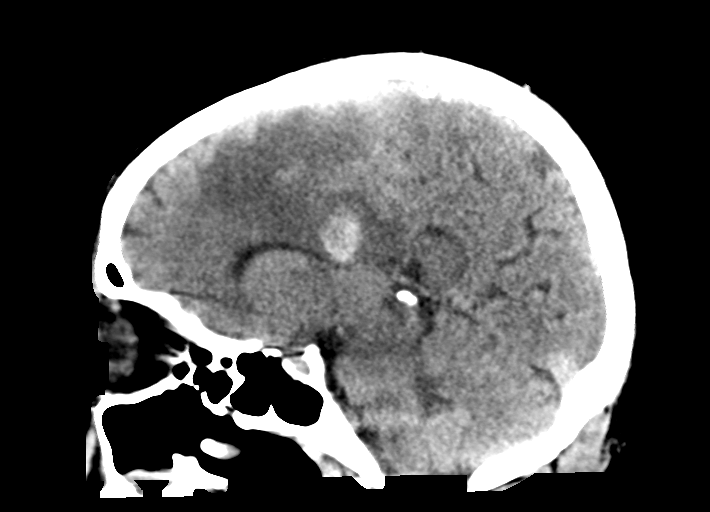
[im 36/54  brain]
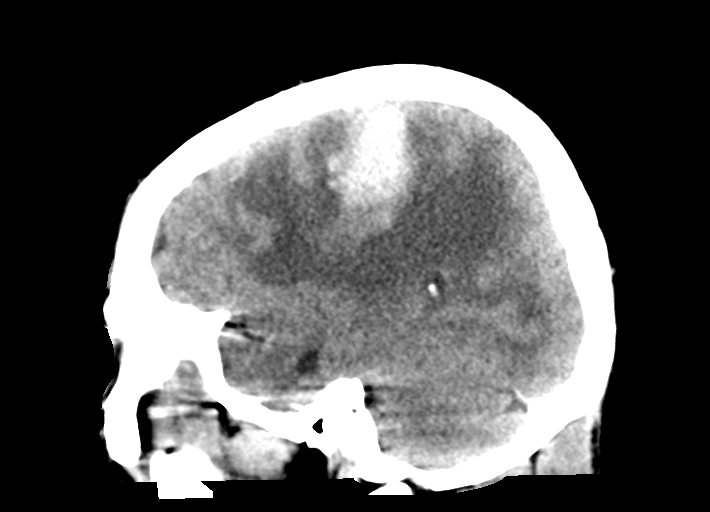

[16 of 47 positions shown; findings below may reference images not displayed]

FINDINGS: Brain: 5.5 x 2.8 cm hematoma in the subcortical high right frontal
region with extensive vasogenic edema in the adjacent brain that is
progressed. There is worsening local mass effect with midline shift
now measuring 13 mm. No entrapment. No cytotoxic edema seen.

Vascular: No hyperdense vessel or unexpected calcification.

Skull: Normal. Negative for fracture or focal lesion.

Sinuses/Orbits: Negative

Other: Critical Value/emergent results were called by telephone at
the time of interpretation on 05/23/2021 at [DATE] to provider
[REDACTED] , who verbally acknowledged these results.

ASPECTS ([place] Stroke Program Early CT Score)

Not scored in this setting
IMPRESSION: Progressed right frontal hematoma and vasogenic edema with midline
shift now measuring 13 mm.

## 2022-08-02 IMAGING — CT CT HEAD W/O CM
3 series · 15 of 30 positions shown, 17 images · non-contrast
Comparison: CT 05/23/2021

CLINICAL DATA: Follow-up intracerebral hemorrhage

EXAM:
CT HEAD WITHOUT CONTRAST
TECHNIQUE: Contiguous axial images were obtained from the base of the skull
through the vertex without intravenous contrast.

[Series 3: head bone · axial · 0.47mm/px · z∈[+967,+1009]mm · 3 of 93 slices shown]
[im 8/93  bone]
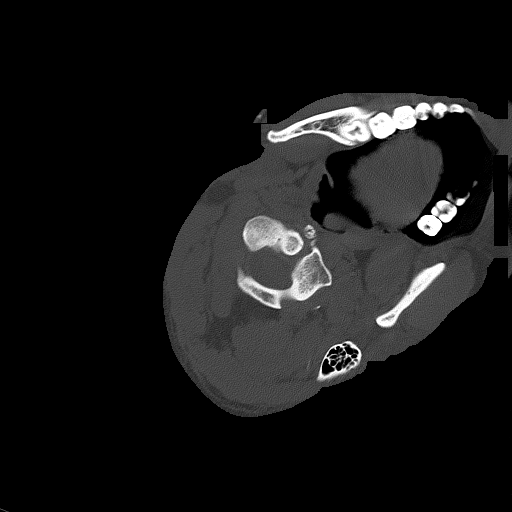
[im 22/93  bone]
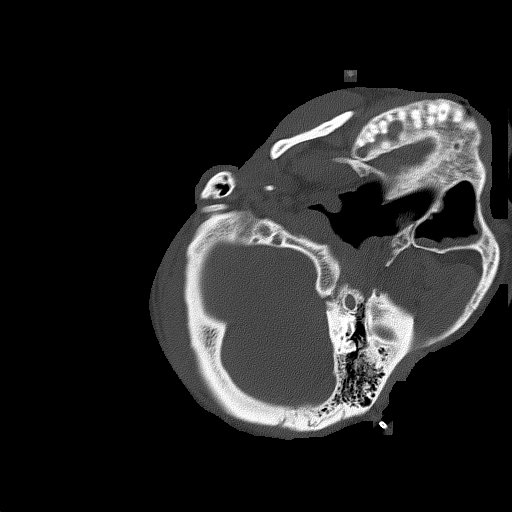
[im 29/93  bone]
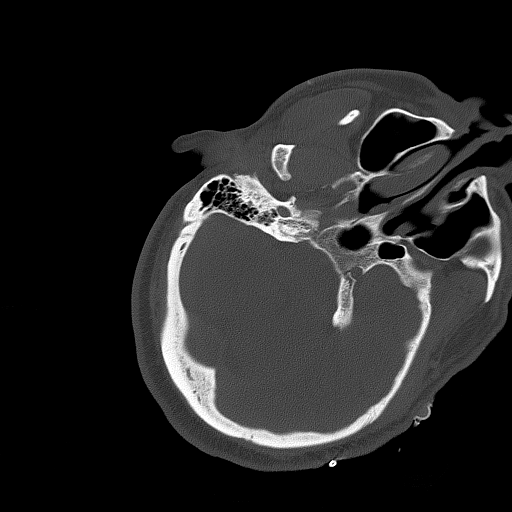

[Series 4: head without · axial · non-contrast · 0.48mm/px · z∈[+992,+1097]mm · 4 of 37 slices shown]
[im 8/37  brain]
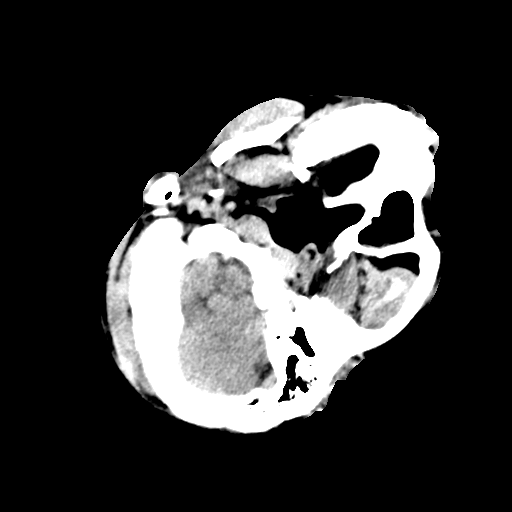
[im 15/37  brain]
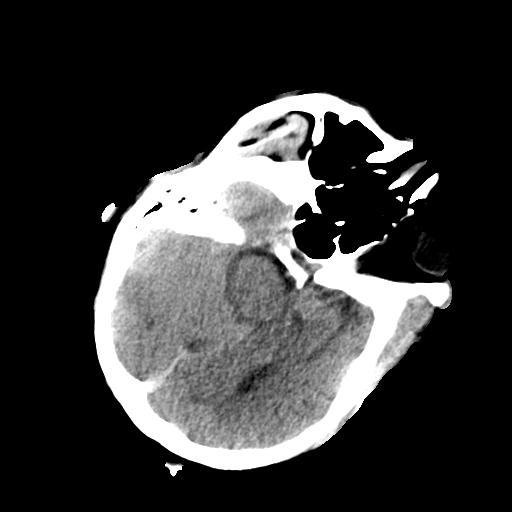
[im 22/37  brain]
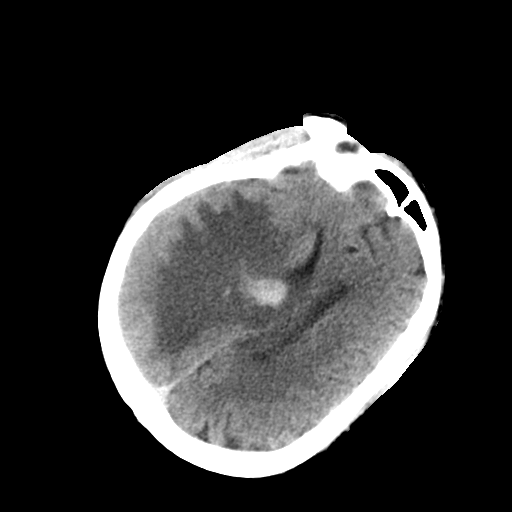
[im 29/37  brain]
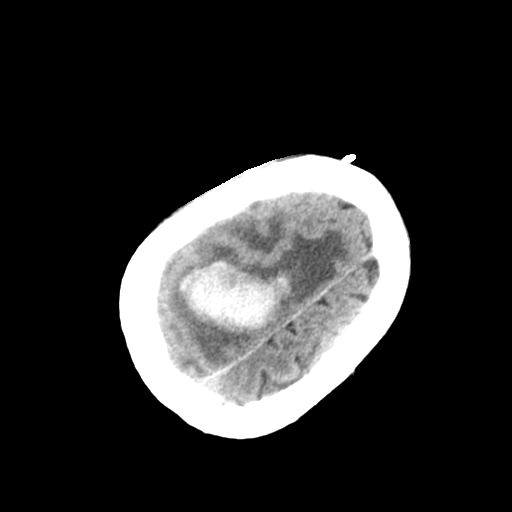

[Series 7: head without axial · axial · non-contrast · 0.37mm/px · z∈[+978,+1119]mm · 8 of 67 slices shown, 10 images]
[im 8/67  brain]
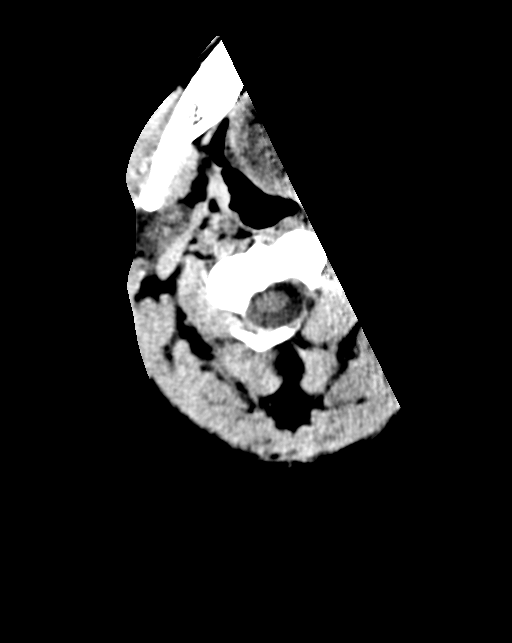
[im 8/67  bone]
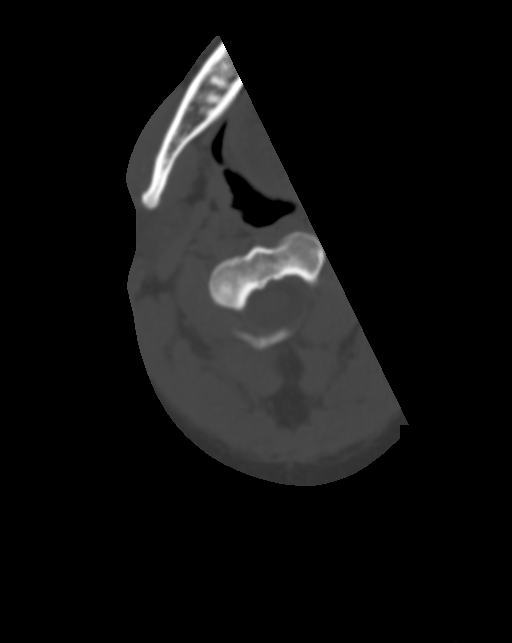
[im 15/67  brain]
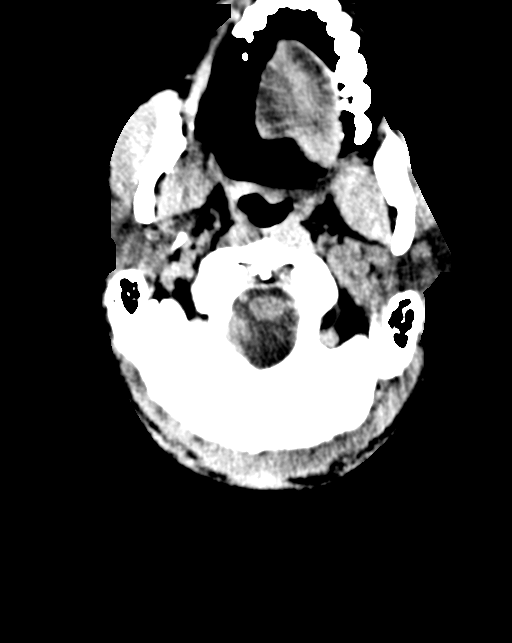
[im 23/67  brain]
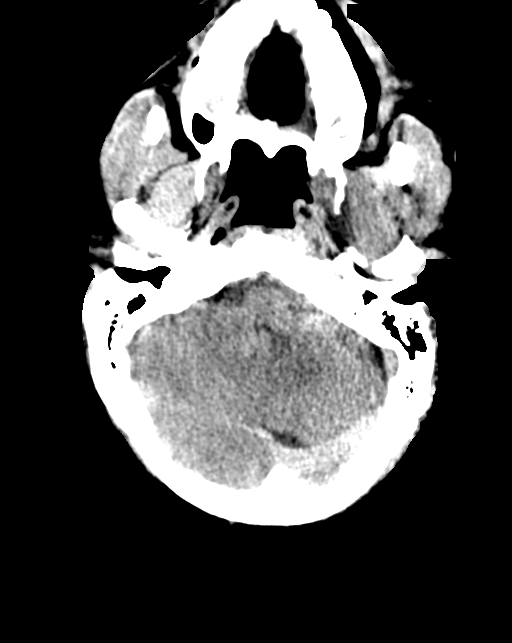
[im 30/67  brain]
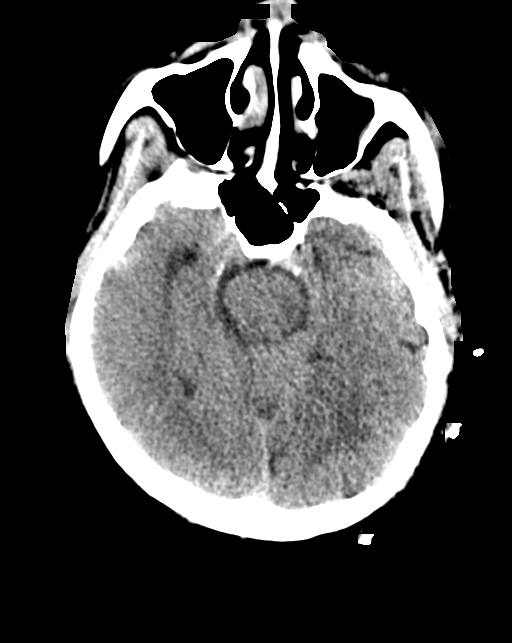
[im 37/67  brain]
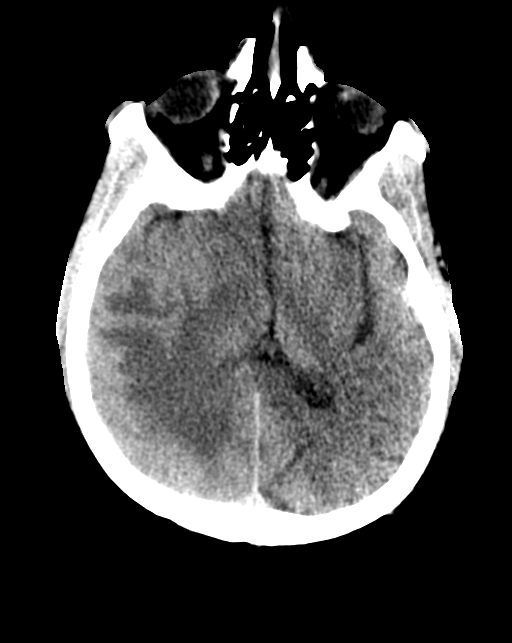
[im 37/67  bone]
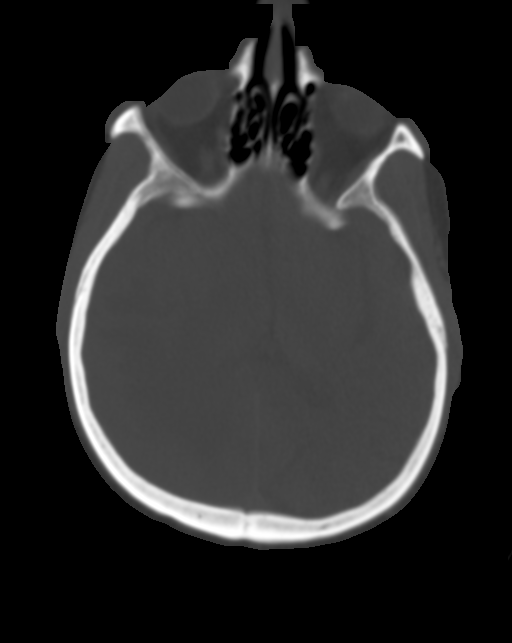
[im 45/67  brain]
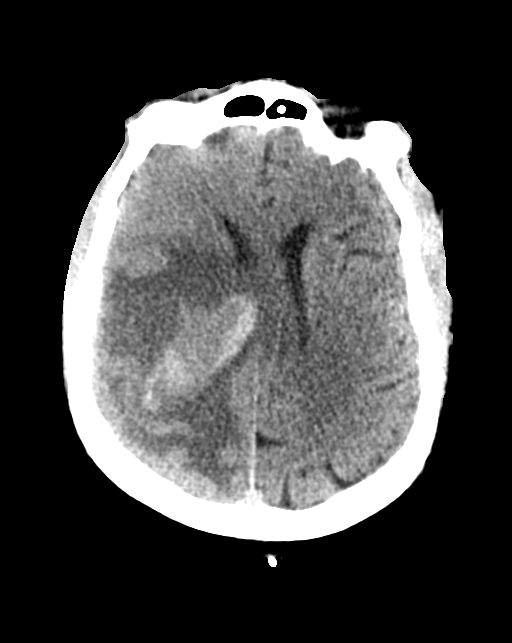
[im 52/67  brain]
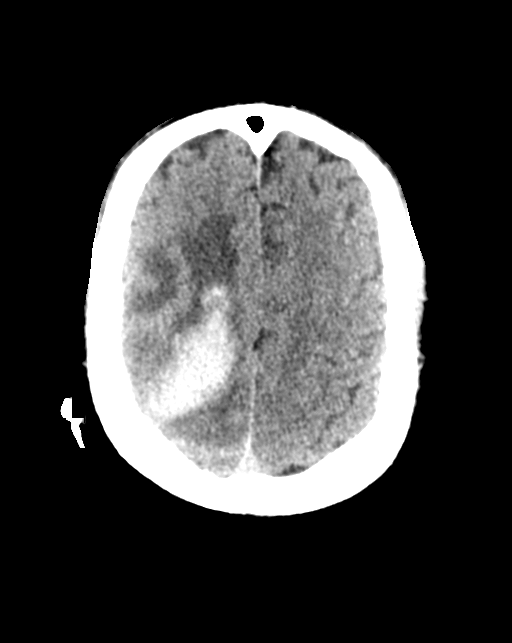
[im 59/67  brain]
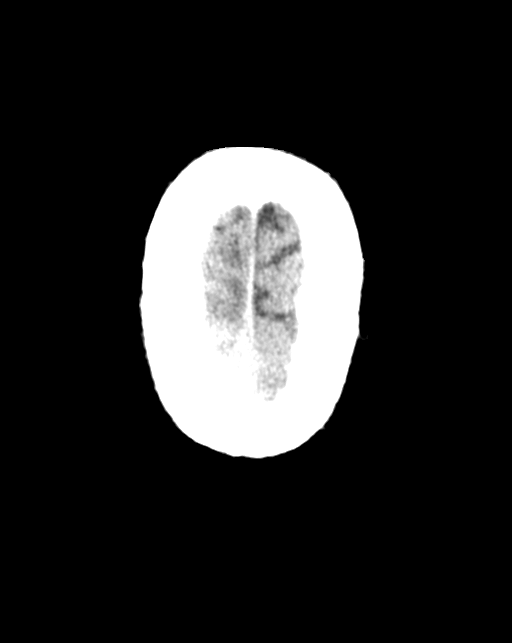

[15 of 30 positions shown; findings below may reference images not displayed]

FINDINGS: Brain: No significant interval change in the overall size of the
intraparenchymal hemorrhagic component seen in the right frontal
with some extensive surrounding hypo attenuating edematous changes.
Locoregional mass effect with sulcal effacement and effacement of
the right lateral ventricle as well as stable subfalcine herniation
with a right to left midline shift of approximately 14 mm. Slight
medialization of the right uncus without clear transtentorial
herniation is also unchanged from prior. Basal cisterns remain
patent. No new sites of hemorrhage. No new loss of gray-white
differentiation.

Vascular: No hyperdense vessel or unexpected calcification.

Skull: Normal. Negative for fracture or focal lesion. EEG leads in
place.

Sinuses/Orbits: Mild mural thickening in the maxillary sinuses.
Paranasal sinuses and mastoid air cells are otherwise predominantly
clear. Included orbital structures are unremarkable.

Other: None.
IMPRESSION: 1. Stable appearance of the right frontal lobe intraparenchymal
hemorrhage with surrounding cerebral edema, local mass effect as
well as subfalcine midline shift of approximately 14 mm in slight
medial ization of the right uncus, not significantly changed from
comparison imaging.
2. No new sites of hemorrhage or new gray-white differentiation
loss.

## 2022-08-04 ENCOUNTER — Ambulatory Visit: Payer: No Typology Code available for payment source | Admitting: Neurology

## 2022-08-08 ENCOUNTER — Encounter: Payer: Self-pay | Admitting: Physical Medicine & Rehabilitation

## 2022-08-09 ENCOUNTER — Telehealth: Payer: Self-pay | Admitting: Family Medicine

## 2022-08-09 ENCOUNTER — Encounter: Payer: Self-pay | Admitting: Family Medicine

## 2022-08-09 NOTE — Telephone Encounter (Signed)
Caller name: Zylan Almquist   On DPR? :yes/no: Yes  Call back number: [phone]  Provider they see:  Neva Seat   Reason for call: Pt wife dropped off a [employer] to be filled out. Forms will be in front bin. Please call wife when forms are complete.

## 2022-08-09 NOTE — Telephone Encounter (Signed)
Please schedule virtual visit this week if possible so I can discuss his health and complete those forms as I have not seen him since October of last year.  I am happy to complete forms but again the will likely need more recent visit than last October.  Thanks

## 2022-08-10 NOTE — Telephone Encounter (Signed)
Placed this in your to be signed folder

## 2022-08-12 NOTE — Telephone Encounter (Signed)
I will work on completing these forms, but please see other message.

## 2022-08-12 NOTE — Telephone Encounter (Signed)
Called spouse. PM&R provider is working on adult day care info/referral. [place] paperwork was also completed by Dr. Riley Kill. No needs from me at this time.

## 2022-08-16 ENCOUNTER — Ambulatory Visit: Payer: No Typology Code available for payment source | Admitting: Neurology

## 2022-08-31 ENCOUNTER — Ambulatory Visit: Payer: No Typology Code available for payment source | Admitting: Physical Medicine & Rehabilitation

## 2022-09-18 ENCOUNTER — Other Ambulatory Visit: Payer: Self-pay | Admitting: Neurology

## 2022-09-20 MED ORDER — LEVETIRACETAM 100 MG/ML PO SOLN: 250.0000 mg | Freq: Two times a day (BID) | ORAL | 0 refills | Status: DC

## 2022-09-21 ENCOUNTER — Other Ambulatory Visit: Payer: Self-pay | Admitting: *Deleted

## 2022-09-21 ENCOUNTER — Other Ambulatory Visit (HOSPITAL_BASED_OUTPATIENT_CLINIC_OR_DEPARTMENT_OTHER): Payer: Self-pay

## 2022-09-21 ENCOUNTER — Telehealth: Payer: Self-pay

## 2022-09-21 MED ORDER — LEVETIRACETAM 100 MG/ML PO SOLN
250.0000 mg | Freq: Two times a day (BID) | ORAL | 0 refills | Status: DC
  Filled 2022-09-21: qty 450, 90d supply, fill #0

## 2022-09-21 NOTE — Telephone Encounter (Signed)
Mrs. Gauss has requested a referral to [facility] for Donald [name]. Facility is located on [address] in [place]. Caller stated insurance will cover the visit with codes 5102 & 5100.   Call back phone [phone]. Thank you

## 2022-09-22 ENCOUNTER — Other Ambulatory Visit (HOSPITAL_BASED_OUTPATIENT_CLINIC_OR_DEPARTMENT_OTHER): Payer: Self-pay

## 2022-09-22 NOTE — Telephone Encounter (Signed)
Message left on Mrs. Manfredonia's voicemail: The script is at the front desk for pick-up. Thank you. Copy to be scanned.

## 2022-10-04 ENCOUNTER — Emergency Department (HOSPITAL_BASED_OUTPATIENT_CLINIC_OR_DEPARTMENT_OTHER): Payer: No Typology Code available for payment source

## 2022-10-04 ENCOUNTER — Encounter (HOSPITAL_BASED_OUTPATIENT_CLINIC_OR_DEPARTMENT_OTHER): Payer: Self-pay

## 2022-10-04 ENCOUNTER — Other Ambulatory Visit: Payer: Self-pay

## 2022-10-04 ENCOUNTER — Emergency Department (HOSPITAL_BASED_OUTPATIENT_CLINIC_OR_DEPARTMENT_OTHER)
Admission: EM | Admit: 2022-10-04 | Discharge: 2022-10-04 | Disposition: A | Discharge status: Home / Self Care | Payer: No Typology Code available for payment source | Attending: Emergency Medicine | Admitting: Emergency Medicine

## 2022-10-04 DIAGNOSIS — Z79899 Other long term (current) drug therapy: Secondary | ICD-10-CM | POA: Diagnosis not present

## 2022-10-04 DIAGNOSIS — R1084 Generalized abdominal pain: Secondary | ICD-10-CM | POA: Diagnosis not present

## 2022-10-04 DIAGNOSIS — R109 Unspecified abdominal pain: Secondary | ICD-10-CM | POA: Diagnosis present

## 2022-10-04 LAB — URINALYSIS, ROUTINE W REFLEX MICROSCOPIC
Bilirubin Urine: NEGATIVE
Glucose, UA: NEGATIVE mg/dL
Hgb urine dipstick: NEGATIVE
Ketones, ur: NEGATIVE mg/dL
Leukocytes,Ua: NEGATIVE
Nitrite: NEGATIVE
Protein, ur: 30 mg/dL — AB
Specific Gravity, Urine: 1.007 (ref 1.005–1.030)
pH: 8 (ref 5.0–8.0)

## 2022-10-04 LAB — COMPREHENSIVE METABOLIC PANEL
ALT: 17 U/L (ref 0–44)
AST: 24 U/L (ref 15–41)
Albumin: 4.6 g/dL (ref 3.5–5.0)
Alkaline Phosphatase: 126 U/L (ref 38–126)
Anion gap: 11 (ref 5–15)
BUN: 12 mg/dL (ref 8–23)
CO2: 26 mmol/L (ref 22–32)
Calcium: 9.9 mg/dL (ref 8.9–10.3)
Chloride: 103 mmol/L (ref 98–111)
Creatinine, Ser: 1.06 mg/dL (ref 0.61–1.24)
GFR, Estimated: >60 mL/min (ref >60)
Glucose, Bld: 114 mg/dL — ABNORMAL HIGH (ref 70–99)
Potassium: 3.9 mmol/L (ref 3.5–5.1)
Sodium: 140 mmol/L (ref 135–145)
Total Bilirubin: 0.5 mg/dL (ref 0.3–1.2)
Total Protein: 7.3 g/dL (ref 6.5–8.1)

## 2022-10-04 LAB — CBC
HCT: 44.8 % (ref 39.0–52.0)
Hemoglobin: 14.4 g/dL (ref 13.0–17.0)
MCH: 30.3 pg (ref 26.0–34.0)
MCHC: 32.1 g/dL (ref 30.0–36.0)
MCV: 94.3 fL (ref 80.0–100.0)
Platelets: 277 10*3/uL (ref 150–400)
RBC: 4.75 MIL/uL (ref 4.22–5.81)
RDW: 13.7 % (ref 11.5–15.5)
WBC: 8.4 10*3/uL (ref 4.0–10.5)
nRBC: 0 % (ref 0.0–0.2)

## 2022-10-04 LAB — LIPASE, BLOOD: Lipase: 12 U/L (ref 11–51)

## 2022-10-04 MED ORDER — ONDANSETRON HCL 4 MG/2ML IJ SOLN
4.0000 mg | Freq: Once | INTRAMUSCULAR | Status: AC
  Administered 2022-10-04: 4 mg via INTRAVENOUS
  Filled 2022-10-04: qty 2

## 2022-10-04 MED ORDER — SODIUM CHLORIDE 0.9 % IV BOLUS
1000.0000 mL | Freq: Once | INTRAVENOUS | Status: AC
  Administered 2022-10-04: 1000 mL via INTRAVENOUS

## 2022-10-04 MED ORDER — MORPHINE SULFATE (PF) 4 MG/ML IV SOLN
4.0000 mg | Freq: Once | INTRAVENOUS | Status: AC
  Administered 2022-10-04: 4 mg via INTRAVENOUS
  Filled 2022-10-04: qty 1

## 2022-10-04 MED ORDER — IOHEXOL 300 MG/ML  SOLN
100.0000 mL | Freq: Once | INTRAMUSCULAR | Status: AC | PRN
  Administered 2022-10-04: 80 mL via INTRAVENOUS

## 2022-10-04 NOTE — ED Provider Notes (Signed)
[facility] Provider Note   CSN: [id number] Arrival date & time: 10/04/22  1229     History  Chief Complaint  Patient presents with   Emesis    Donald [name] is a 66 y.o. male.  66 yo M with a chief complaints of abdominal pain.  This started suddenly this morning.  Diffuse abdominal discomfort.  Some nausea with this as well.  Vomiting.  No reported constipation or diarrhea.   Emesis      Home Medications Prior to Admission medications   Medication Sig Start Date End Date Taking? Authorizing Provider  amLODipine (NORVASC) 2.5 MG tablet Take 1 tablet (2.5 mg total) by mouth daily. Patient not taking: Reported on 05/25/2022 04/08/22 07/07/22  Kayleen Memos, DO  Lacosamide 100 MG TABS Take 1 tablet (100 mg total) by mouth 2 (two) times daily. 05/11/22 05/06/23  Garvin Fila, MD  levETIRAcetam (KEPPRA) 100 MG/ML solution Take 2.5 mLs (250 mg total) by mouth 2 (two) times daily. 09/21/22 12/22/22  Garvin Fila, MD  Multiple Vitamin (MULTIVITAMIN) tablet Take 1 tablet by mouth daily.    [provider]  polyethylene glycol (MIRALAX / GLYCOLAX) 17 g packet Take 17 g by mouth daily. Patient not taking: Reported on 05/25/2022 04/08/22   Kayleen Memos, DO  zinc gluconate 50 MG tablet Take 50 mg by mouth daily.    [provider]      Allergies    Sulfa antibiotics and Zanaflex [tizanidine]    Review of Systems   Review of Systems  Gastrointestinal:  Positive for vomiting.    Physical Exam Updated Vital Signs BP 133/81   Pulse 68   Temp (!) 97.5 F (36.4 C) (Oral)   Resp 18   Ht 5\' 5"  (1.651 m)   Wt 58.5 kg   SpO2 100%   BMI 21.46 kg/m  Physical Exam Vitals and nursing note reviewed.  Constitutional:      Appearance: He is well-developed.  HENT:     Head: Normocephalic and atraumatic.  Eyes:     Pupils: Pupils are equal, round, and reactive to light.  Neck:     Vascular: No JVD.  Cardiovascular:     Rate and  Rhythm: Normal rate and regular rhythm.     Heart sounds: No murmur heard.    No friction rub. No gallop.  Pulmonary:     Effort: No respiratory distress.     Breath sounds: No wheezing.  Abdominal:     General: There is distension.     Tenderness: There is abdominal tenderness. There is no guarding or rebound.     Comments: Mild distention with diffuse abdominal discomfort.  Musculoskeletal:        General: Normal range of motion.     Cervical back: Normal range of motion and neck supple.  Skin:    Coloration: Skin is not pale.     Findings: No rash.  Neurological:     Mental Status: He is alert and oriented to person, place, and time.  Psychiatric:        Behavior: Behavior normal.     ED Results / Procedures / Treatments   Labs (all labs ordered are listed, but only abnormal results are displayed) Labs Reviewed  COMPREHENSIVE METABOLIC PANEL - Abnormal; Notable for the following components:      Result Value   Glucose, Bld 114 (*)    All other components within normal limits  URINALYSIS, ROUTINE W REFLEX MICROSCOPIC -  Abnormal; Notable for the following components:   APPearance CLOUDY (*)    Protein, ur 30 (*)    All other components within normal limits  LIPASE, BLOOD  CBC    EKG None  Radiology CT ABDOMEN PELVIS W CONTRAST  Result Date: 10/04/2022 CLINICAL DATA:  Left lower quadrant abdominal pain with nausea and vomiting. EXAM: CT ABDOMEN AND PELVIS WITH CONTRAST TECHNIQUE: Multidetector CT imaging of the abdomen and pelvis was performed using the standard protocol following bolus administration of intravenous contrast. RADIATION DOSE REDUCTION: This exam was performed according to the departmental dose-optimization program which includes automated exposure control, adjustment of the mA and/or kV according to patient size and/or use of iterative reconstruction technique. CONTRAST:  23mL OMNIPAQUE IOHEXOL 300 MG/ML  SOLN COMPARISON:  Right upper quadrant ultrasound  dated June 06, 2021. FINDINGS: Lower chest: No acute abnormality. Hepatobiliary: Unchanged 1.3 cm cyst in the right liver. No new focal liver abnormality. The gallbladder is unremarkable. No biliary dilatation. Pancreas: Mild dilatation of the proximal main pancreatic duct, measuring 4 mm in diameter. Otherwise unremarkable without surrounding inflammatory changes. Spleen: Normal in size without focal abnormality. Adrenals/Urinary Tract: Adrenal glands are unremarkable. Kidneys are normal, without renal calculi, focal lesion, or hydronephrosis. Bladder is unremarkable. Stomach/Bowel: Stomach is within normal limits. Appendix appears normal. No evidence of bowel wall thickening, distention, or inflammatory changes. Vascular/Lymphatic: IVC filter noted below the level of the renal veins. Diminutive infrarenal IVC and iliac veins. No enlarged abdominal or pelvic lymph nodes. Reproductive: Prostate is unremarkable. Other: Tiny fat containing umbilical hernia. No free fluid or pneumoperitoneum. Mild presacral stranding. Musculoskeletal: Extensive heterotopic ossification about the left hip and within the left iliopsoas muscle, presumably related to old injury. No acute or significant osseous findings. IMPRESSION: 1. No acute intra-abdominal process. Electronically Signed   By: Titus Dubin M.D.   On: 10/04/2022 14:57    Procedures Procedures    Medications Ordered in ED Medications  sodium chloride 0.9 % bolus 1,000 mL (1,000 mLs Intravenous New Bag/Given 10/04/22 1404)  morphine (PF) 4 MG/ML injection 4 mg (4 mg Intravenous Given 10/04/22 1404)  ondansetron (ZOFRAN) injection 4 mg (4 mg Intravenous Given 10/04/22 1404)  iohexol (OMNIPAQUE) 300 MG/ML solution 100 mL (80 mLs Intravenous Contrast Given 10/04/22 1435)    ED Course/ Medical Decision Making/ A&P                           Medical Decision Making Amount and/or Complexity of Data Reviewed Labs: ordered. Radiology:  ordered.  Risk Prescription drug management.   66 yo M with a chief complaints of continuous abdominal pain.  This been going on for about 6 hours now.  Diffuse abdominal pain on exam.  We will treat pain and nausea.  Lab work.  CT scan.  Blood work without significant finding.  UA negative for infection.  LFTs and lipase are unremarkable.  CT scan of the abdomen pelvis with no acute abnormality.  I reassessed the patient.  Doing mildly better.  We will have him do a trial of MiraLAX cleanout.  PCP follow-up.  3:05 PM:  I have discussed the diagnosis/risks/treatment options with the patient.  Evaluation and diagnostic testing in the emergency department does not suggest an emergent condition requiring admission or immediate intervention beyond what has been performed at this time.  They will follow up with PCP. We also discussed returning to the ED immediately if new or worsening sx occur. We  discussed the sx which are most concerning (e.g., sudden worsening pain, fever, inability to tolerate by mouth) that necessitate immediate return. Medications administered to the patient during their visit and any new prescriptions provided to the patient are listed below.  Medications given during this visit Medications  sodium chloride 0.9 % bolus 1,000 mL (1,000 mLs Intravenous New Bag/Given 10/04/22 1404)  morphine (PF) 4 MG/ML injection 4 mg (4 mg Intravenous Given 10/04/22 1404)  ondansetron (ZOFRAN) injection 4 mg (4 mg Intravenous Given 10/04/22 1404)  iohexol (OMNIPAQUE) 300 MG/ML solution 100 mL (80 mLs Intravenous Contrast Given 10/04/22 1435)     The patient appears reasonably screen and/or stabilized for discharge and I doubt any other medical condition or other [facility] requiring further screening, evaluation, or treatment in the ED at this time prior to discharge.          Final Clinical Impression(s) / ED Diagnoses Final diagnoses:  Generalized abdominal pain    Rx / DC Orders ED  Discharge Orders     None         Deno Etienne, DO 10/04/22 1505

## 2022-10-04 NOTE — ED Triage Notes (Signed)
Patient here POV from [place] with Family.  Endorses Sudden Onset of ABD Pain and Nausea/Emesis this AM. 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 VSS at [facility].   Vomiting in Triage. A&Ox4. GCS 15. BIB Stretcher.

## 2022-10-04 NOTE — Discharge Instructions (Signed)
Take 8 scoops of miralax in 32oz of whatever you would like to drink.(Gatorade comes in this size) You can also use a fleets enema which you can buy over the counter at the pharmacy.  Return for worsening abdominal pain, vomiting or fever.  Your CT scan did not show any obvious acute finding.  On my review of the images you may be slightly constipated.  We will try a cleanout at home.  Please follow-up with your family doctor in the office.

## 2022-11-10 ENCOUNTER — Ambulatory Visit (INDEPENDENT_AMBULATORY_CARE_PROVIDER_SITE_OTHER): Payer: No Typology Code available for payment source | Admitting: Neurology

## 2022-11-10 VITALS — BP 126/79 | HR 66

## 2022-11-10 DIAGNOSIS — I611 Nontraumatic intracerebral hemorrhage in hemisphere, cortical: Secondary | ICD-10-CM | POA: Diagnosis not present

## 2022-11-10 DIAGNOSIS — G8110 Spastic hemiplegia affecting unspecified side: Secondary | ICD-10-CM

## 2022-11-10 DIAGNOSIS — G40209 Localization-related (focal) (partial) symptomatic epilepsy and epileptic syndromes with complex partial seizures, not intractable, without status epilepticus: Secondary | ICD-10-CM | POA: Diagnosis not present

## 2022-11-10 MED ORDER — LEVETIRACETAM 100 MG/ML PO SOLN: 250.0000 mg | Freq: Two times a day (BID) | ORAL | 3 refills | Status: DC

## 2022-11-10 NOTE — Progress Notes (Signed)
[facility] [address] [place]. [place] [address] [phone]       OFFICE CONSULT NOTE  Donald [name] Date of Birth:  1955-12-29 Medical Record Number:  [id number]   Referring MD: Delle Reining, PA-C  Reason for Referral: Intracerebral hemorrhage  HPI: Initial visit 04/14/2022 Donald [name] is a 66 year old African-American male seen today for office consultation visit following intracerebral hemorrhage.  He is accompanied by his wife.  History is obtained from them and review of electronic medical records and I personally reviewed pertinent available imaging films in PACS. Donald [name] is a 66 y.o. male with a medical history significant for dementia with behavioral disturbance and reported OSA not on CPAP per wife who presented to the ED via EMS for evaluation of acute onset of left hemiparesis, right gaze, and left mouth droop on 05/11/2021 Donald [name] had woken up in his normal state of health this morning. He and his wife had intercourse this morning and when she got up to get ready for the day, she noticed that Donald [name] was acting "off". She asked him to get out of bed and he stated that he was unable to move his left side and she noticed that he was looking towards the right so she activated EMS. His initial manual blood pressure at 08:15 on 05/11/2021 was 194/104 and on arrival to the hospital around 09:00 his blood pressure was 122/74. On arrival, Donald [name] was noted to have persistent left hemiparesis, right gaze, neck pain, left mouth droop, and decreased sensation on the left and was immediately taken to CT for stroke evaluation.  ICH score was 0.  NIH stroke scale on admission was 17.  CT head showed a large right frontal parenchymal hemorrhage with small adjacent subarachnoid hemorrhage and cytotoxic edema but no intraventricular extension hydrocephalus.  MRI scan of the brain was also obtained which showed parenchymal hematoma without underlying mass  lesion.  CT angiogram of the head and neck showed no large vessel stenosis, aneurysm or AV malformation.  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  2D echo showed ejection fraction of 65 to 70%.  LDL cholesterol is 140 mg percent.  Hemoglobin A1c of 5.8.  Urine drug screen was negative.  Patient was admitted in the intensive care unit.  Blood pressure slightly controlled.  Subsequently he was transferred out of the unit to inpatient rehab.  On 05/24/2021 he was found to be lethargic and drowsy and sleepy on the rehab floor and repeat CT scan of the head showed more prominent cerebral edema around his recent intracerebral hemorrhage with increased midline shift.  There was also concern for seizures so he was placed on long-term EEG monitoring and started on Keppra.  Several EEGs were obtained and long-term monitoring suggested right-sided cortical dysfunction due to the hemorrhage and severe diffuse encephalopathy but no definite seizures were noted.  He was started on hypertonic saline and was also found to be encephalopathy due to combination of cerebral edema, seizure fever and leukocytosis and mild renal insufficiency.  Condition gradually improved.  He subsequently underwent cerebral catheter angiogram on 05/28/2021 which showed again no evidence of aneurysm, AV malformation or vascular stenosis.  Right transverse and sigmoid sinuses had no flow likely due to occlusion.  His mental status showed gradual improvement and Keppra was discontinued and he was started on amantadine to promote wakefulness.  Patient went to inpatient rehab and was discharged home on  August 1.  He is living at home with his wife being the sole caregiver.  They do have some help from family members.  Patient is currently getting home physical occupational therapy but is not able to walk.  He has trace movement in his left elbow extension and left feet.  He can  help a little bit with transfers but he can barely stand even with one-person assist from the therapist.  He has had cognitive impairment and memory problems and is slow in processing information though is able to speak clearly but speech is slow and hesitant and is not very talkative.  He has no prior history of strokes, TIAs, seizures or significant neurological problems.  He has no known history of hypertension. Update 11/10/2022 : He returns for follow-up after last visit 6 months ago.  He is accompanied by his wife. Patient is more alert unchanged.  Continues to have significant spastic left hemiplegia with barely any movements.  He is able to help with transfers is tolerating but requires total care.  He can pull himself up with support.  He sees Dr. Riley Kill and gets Botox did help to some degree Remains on Keppra 250 mg twice daily and Vimpat 100 mg twice daily and tolerating both medications well without side effects.  Has not had any further breakthrough seizures.  He goes to adult daycare 5 days a week when his wife is working. ROS:   14 system review of systems is positive for weakness, difficulty walking, speech difficulty, memory loss, cognitive impairment, incontinence and all other systems negative  PMH:  Past Medical History:  Diagnosis Date   Arthritis    Stroke [facility])     Social History:  Social History   Socioeconomic History   Marital status: Married    Spouse name: Not on file   Number of children: Not on file   Years of education: Not on file   Highest education level: Not on file  Occupational History   Not on file  Tobacco Use   Smoking status: Never   Smokeless tobacco: Never  Vaping Use   Vaping Use: Never used  Substance and Sexual Activity   Alcohol use: No   Drug use: No   Sexual activity: Yes    Birth control/protection: None  Other Topics Concern   Not on file  Social History Narrative   Not on file   Social Determinants of Health   Financial  Resource Strain: Not on file  Food Insecurity: Not on file  Transportation Needs: Not on file  Physical Activity: Not on file  Stress: Not on file  Social Connections: Not on file  Intimate Partner Violence: Not on file    Medications:   Current Outpatient Medications on File Prior to Visit  Medication Sig Dispense Refill   Lacosamide 100 MG TABS Take 1 tablet (100 mg total) by mouth 2 (two) times daily. 180 tablet 3   levETIRAcetam (KEPPRA) 100 MG/ML solution Take 2.5 mLs (250 mg total) by mouth 2 (two) times daily. 450 mL 0   Multiple Vitamin (MULTIVITAMIN) tablet Take 1 tablet by mouth daily.     No current facility-administered medications on file prior to visit.    Allergies:   Allergies  Allergen Reactions   Sulfa Antibiotics Other (See Comments)    Nausea and eye swelling    Zanaflex [Tizanidine]     lethargy    Physical Exam General: Frail malnourished looking middle-aged African-American male sitting in a wheelchair., seated,  in no evident distress Head: head normocephalic and atraumatic.   Neck: supple with no carotid or supraclavicular bruits Cardiovascular: regular rate and rhythm, no murmurs Musculoskeletal: no deformity Skin:  no rash/petichiae Vascular:  Normal pulses all extremities  Neurologic Exam Mental Status: Awake and fully alert.  Not very cooperative for detailed cognitive testing.  Follows only simple midline and one-step commands.'s speech is nonfluent and speaks only few words and short sentences but no dysarthria.   Cranial Nerves: Fundoscopic exam not done. Pupils equal, briskly reactive to light. Extraocular movements show right gaze preference but able to look to the left past midline but not all the way.  Does not blink to threat on the left but does so on the right.  Hearing intact. Facial sensation intact.  Left lower facial weakness., tongue, palate moves normally and symmetrically.  Motor: Spastic left hemiplegia with 1/5 strength in left  upper extremity with mostly strength only in elbow extension and trace movement in the left lower extremities.  Tone is increased on the left.  Normal strength on the right. Sensory.: intact to touch , pinprick , position and vibratory sensation on the right and decreased on the left.  Coordination: Preserved on the right and not testable on the left.   \Gait and Station: Not tested as patient is unable to even stand. Reflexes: 1+ and asymmetric. Toes downgoing.   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    PLAN:II had a long discussion with the patient and his wife regarding his remote intracerebral hemorrhage and particularly plegia and partial seizures.  Continue current antiepileptic regimen of Vimpat 100 mg twice daily and Keppra  250 mg twice daily he is doing well without any breakthrough seizures.  Strict control of hypertension with blood pressure goal below 130/90.  Continue follow-up with Dr. Riley Kill for Botox for his spasticity.  Return for follow-up in 1 year or call earlier if necessary. Greater than 50% time during this 35-minute   visit was spent in counseling and coordination of care about his intracerebral hemorrhage and spastic Ameeth plegia and answering questions. Delia Heady, MD  Note: This document was prepared with digital dictation and possible smart phrase technology. Any transcriptional errors that result from this process are unintentional.

## 2022-11-10 NOTE — Patient Instructions (Signed)
I had a long discussion with the patient and his wife regarding his remote intracerebral hemorrhage and particularly plegia and partial seizures.  Continue current antiepileptic regimen of Vimpat 100 mg twice daily and Keppra  250 mg twice daily he is doing well without any breakthrough seizures.  Strict control of hypertension with blood pressure goal below 130/90.  Continue follow-up with Dr. Riley Kill for Botox for his spasticity.  Return for follow-up in 1 year or call earlier if necessary.

## 2022-11-23 ENCOUNTER — Encounter: Payer: Self-pay | Admitting: Physical Medicine & Rehabilitation

## 2022-11-23 ENCOUNTER — Encounter
Payer: No Typology Code available for payment source | Attending: Physical Medicine & Rehabilitation | Admitting: Physical Medicine & Rehabilitation

## 2022-11-23 VITALS — BP 178/106 | HR 104 | Temp 98.7°F | Ht 65.0 in

## 2022-11-23 DIAGNOSIS — G8114 Spastic hemiplegia affecting left nondominant side: Secondary | ICD-10-CM | POA: Diagnosis present

## 2022-11-23 MED ORDER — ONABOTULINUMTOXINA 100 UNITS IJ SOLR
400.0000 [IU] | Freq: Once | INTRAMUSCULAR | Status: AC
  Administered 2022-11-23: 400 [IU] via INTRAMUSCULAR

## 2022-11-23 NOTE — Addendum Note (Signed)
Addended by: Becky Sax on: 11/23/2022 04:44 PM   Modules accepted: Orders

## 2022-11-23 NOTE — Progress Notes (Signed)
Botox Injection for spasticity of upper extremity using needle EMG guidance Indication: 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)  G81.14 Dilution: 100 Units/ml        Total Units Injected: 400 Indication: Severe spasticity which interferes with ADL,mobility and/or  hygiene and is unresponsive to medication management and other conservative care Informed consent was obtained after describing risks and benefits of the procedure with the patient. This includes bleeding, bruising, infection, excessive weakness, or medication side effects. A REMS form is on file and signed.  Needle: 75mm injectable monopolar needle electrode    Number of units per muscle Pectoralis Major 0 units Pectoralis Minor 0 units Biceps 200 units Brachioradialis 0 units FCR 25 units FCU 25 units FDS 75 units FDP 75 units FPL 0 units Palmaris Longus 0 units Pronator Teres 0 units Pronator Quadratus 0 units Lumbricals 0 units All injections were done after obtaining appropriate EMG activity and after negative drawback for blood. The patient tolerated the procedure well. Post procedure instructions were given. Return in about 3 months (around 02/22/2023) for reassessment of spasticity.

## 2022-11-23 NOTE — Patient Instructions (Signed)
ALWAYS FEEL FREE TO CALL OUR OFFICE WITH ANY PROBLEMS OR QUESTIONS ([phone])  **PLEASE NOTE** ALL MEDICATION REFILL REQUESTS (INCLUDING CONTROLLED SUBSTANCES) NEED TO BE MADE AT LEAST 7 DAYS PRIOR TO REFILL BEING DUE. ANY REFILL REQUESTS INSIDE THAT TIME FRAME MAY RESULT IN DELAYS IN RECEIVING YOUR PRESCRIPTION.                    

## 2022-11-26 ENCOUNTER — Other Ambulatory Visit (HOSPITAL_BASED_OUTPATIENT_CLINIC_OR_DEPARTMENT_OTHER): Payer: Self-pay

## 2022-11-26 ENCOUNTER — Other Ambulatory Visit: Payer: Self-pay | Admitting: Neurology

## 2022-11-30 ENCOUNTER — Other Ambulatory Visit: Payer: Self-pay

## 2022-11-30 ENCOUNTER — Other Ambulatory Visit (HOSPITAL_BASED_OUTPATIENT_CLINIC_OR_DEPARTMENT_OTHER): Payer: Self-pay

## 2022-11-30 ENCOUNTER — Encounter (HOSPITAL_BASED_OUTPATIENT_CLINIC_OR_DEPARTMENT_OTHER): Payer: Self-pay | Admitting: Pharmacist

## 2022-11-30 MED ORDER — LACOSAMIDE 100 MG PO TABS: 100.0000 mg | ORAL_TABLET | Freq: Two times a day (BID) | ORAL | 3 refills | Status: DC

## 2022-12-02 ENCOUNTER — Other Ambulatory Visit (HOSPITAL_BASED_OUTPATIENT_CLINIC_OR_DEPARTMENT_OTHER): Payer: Self-pay

## 2022-12-02 ENCOUNTER — Other Ambulatory Visit: Payer: Self-pay | Admitting: Neurology

## 2022-12-02 MED ORDER — LACOSAMIDE 100 MG PO TABS
ORAL_TABLET | ORAL | 0 refills | Status: DC
  Filled 2022-12-02: qty 60, 30d supply, fill #0

## 2022-12-03 ENCOUNTER — Other Ambulatory Visit: Payer: Self-pay

## 2022-12-03 ENCOUNTER — Other Ambulatory Visit (HOSPITAL_BASED_OUTPATIENT_CLINIC_OR_DEPARTMENT_OTHER): Payer: Self-pay

## 2022-12-06 ENCOUNTER — Other Ambulatory Visit (HOSPITAL_BASED_OUTPATIENT_CLINIC_OR_DEPARTMENT_OTHER): Payer: Self-pay

## 2022-12-06 MED ORDER — LEVETIRACETAM 100 MG/ML PO SOLN
250.0000 mg | Freq: Two times a day (BID) | ORAL | 0 refills | Status: DC
  Filled 2022-12-06: qty 450, 90d supply, fill #0

## 2022-12-06 NOTE — Telephone Encounter (Signed)
Rx sent as per last office visit note. 

## 2022-12-07 ENCOUNTER — Emergency Department (HOSPITAL_COMMUNITY): Payer: No Typology Code available for payment source

## 2022-12-07 ENCOUNTER — Emergency Department (HOSPITAL_COMMUNITY)
Admission: EM | Admit: 2022-12-07 | Discharge: 2022-12-08 | Disposition: A | Discharge status: Home / Self Care | Payer: No Typology Code available for payment source | Attending: Emergency Medicine | Admitting: Emergency Medicine

## 2022-12-07 ENCOUNTER — Other Ambulatory Visit: Payer: Self-pay

## 2022-12-07 DIAGNOSIS — I10 Essential (primary) hypertension: Secondary | ICD-10-CM | POA: Insufficient documentation

## 2022-12-07 DIAGNOSIS — R0602 Shortness of breath: Secondary | ICD-10-CM | POA: Diagnosis not present

## 2022-12-07 DIAGNOSIS — F0390 Unspecified dementia without behavioral disturbance: Secondary | ICD-10-CM | POA: Diagnosis not present

## 2022-12-07 DIAGNOSIS — R079 Chest pain, unspecified: Secondary | ICD-10-CM | POA: Diagnosis present

## 2022-12-07 DIAGNOSIS — Z20822 Contact with and (suspected) exposure to covid-19: Secondary | ICD-10-CM | POA: Diagnosis not present

## 2022-12-07 LAB — BASIC METABOLIC PANEL
Anion gap: 6 (ref 5–15)
BUN: 7 mg/dL — ABNORMAL LOW (ref 8–23)
CO2: 25 mmol/L (ref 22–32)
Calcium: 9.6 mg/dL (ref 8.9–10.3)
Chloride: 106 mmol/L (ref 98–111)
Creatinine, Ser: 0.96 mg/dL (ref 0.61–1.24)
GFR, Estimated: >60 mL/min (ref >60)
Glucose, Bld: 89 mg/dL (ref 70–99)
Potassium: 3.8 mmol/L (ref 3.5–5.1)
Sodium: 137 mmol/L (ref 135–145)

## 2022-12-07 LAB — CBC WITH DIFFERENTIAL/PLATELET
Abs Immature Granulocytes: 0.01 10*3/uL (ref 0.00–0.07)
Basophils Absolute: 0 10*3/uL (ref 0.0–0.1)
Basophils Relative: 1 %
Eosinophils Absolute: 0.1 10*3/uL (ref 0.0–0.5)
Eosinophils Relative: 2 %
HCT: 43.8 % (ref 39.0–52.0)
Hemoglobin: 14.7 g/dL (ref 13.0–17.0)
Immature Granulocytes: 0 %
Lymphocytes Relative: 46 %
Lymphs Abs: 2.2 10*3/uL (ref 0.7–4.0)
MCH: 31.4 pg (ref 26.0–34.0)
MCHC: 33.6 g/dL (ref 30.0–36.0)
MCV: 93.6 fL (ref 80.0–100.0)
Monocytes Absolute: 0.4 10*3/uL (ref 0.1–1.0)
Monocytes Relative: 9 %
Neutro Abs: 1.9 10*3/uL (ref 1.7–7.7)
Neutrophils Relative %: 42 %
Platelets: 265 10*3/uL (ref 150–400)
RBC: 4.68 MIL/uL (ref 4.22–5.81)
RDW: 12.8 % (ref 11.5–15.5)
WBC: 4.7 10*3/uL (ref 4.0–10.5)
nRBC: 0 % (ref 0.0–0.2)

## 2022-12-07 LAB — TROPONIN I (HIGH SENSITIVITY): Troponin I (High Sensitivity): 4 ng/L (ref <18)

## 2022-12-07 LAB — BRAIN NATRIURETIC PEPTIDE: B Natriuretic Peptide: 11.2 pg/mL (ref 0.0–100.0)

## 2022-12-07 NOTE — ED Triage Notes (Signed)
Patient arrived with EMS from home reports central chest pain with mild SOB this evening , he received ASA 324 mg and 1 NTG sl prior to arrival .

## 2022-12-07 NOTE — ED Provider Triage Note (Signed)
Emergency Medicine Provider Triage Evaluation Note  Melanie Pellot , a 66 y.o. male  was evaluated in triage.  Pt complains of chest pain.  States he had chest pain earlier today.  No current pain.  History of prior CVA per EMS with left-sided deficits.  States he is short of breath earlier.  No pain or swelling to lower legs.  Review of Systems  Positive: CP, SOB Negative: Fever cough  Physical Exam  There were no vitals taken for this visit. Gen:   Awake, no distress   Resp:  Normal effort  MSK:   Left arm contractions Other:    Medical Decision Making  Medically screening exam initiated at 10:15 PM.  Appropriate orders placed.  Camarion Weier was informed that the remainder of the evaluation will be completed by another provider, this initial triage assessment does not replace that evaluation, and the importance of remaining in the ED until their evaluation is complete.  CP, SOB   Romen Yutzy A, PA-C 12/07/22 2215

## 2022-12-08 LAB — RESP PANEL BY RT-PCR (RSV, FLU A&B, COVID)  RVPGX2
Influenza A by PCR: NEGATIVE
Influenza B by PCR: NEGATIVE
Resp Syncytial Virus by PCR: NEGATIVE
SARS Coronavirus 2 by RT PCR: NEGATIVE

## 2022-12-08 LAB — TROPONIN I (HIGH SENSITIVITY): Troponin I (High Sensitivity): 4 ng/L (ref <18)

## 2022-12-08 NOTE — ED Notes (Signed)
All discharge instructions reviewed with patient including follow up care. Patient and patient's family at bedside verbalized understanding of same and had no other questions. Patient stable at time of discharge and wheel chair was provided to patient prior to leaving department.

## 2022-12-08 NOTE — ED Provider Notes (Signed)
[facility] EMERGENCY DEPARTMENT Provider Note   CSN: [id number] Arrival date & time: 12/07/22  2159     History  Chief Complaint  Patient presents with   Chest Pain    Donald [name] is a 66 y.o. male.  66 year old male brought in by EMS from home, wife at bedside.  Patient indicated to his wife that he was having chest pain, wife called EMS who found patient to have significantly elevated blood pressure and brought him to the emergency room.  Past history of stroke as well as dementia, patient is not a very good historian.  Wife does note that patient is no longer complaining of pain.       Home Medications Prior to Admission medications   Medication Sig Start Date End Date Taking? Authorizing Provider  Lacosamide 100 MG TABS Take 1 tablet (100 mg total) by mouth 2 (two) times daily. 11/30/22 11/25/23  Micki Riley, MD  Lacosamide 100 MG TABS Take 1 tablet by mouth 2 times daily 12/02/22   Micki Riley, MD  levETIRAcetam (KEPPRA) 100 MG/ML solution Take 2.5 mLs (250 mg total) by mouth 2 (two) times daily. 11/10/22 11/05/23  Micki Riley, MD  levETIRAcetam (KEPPRA) 100 MG/ML solution Take 2.5 mLs (250 mg total) by mouth 2 (two) times daily. 12/06/22 03/06/23  Micki Riley, MD  Multiple Vitamin (MULTIVITAMIN) tablet Take 1 tablet by mouth daily.    [provider]      Allergies    Sulfa antibiotics and Zanaflex [tizanidine]    Review of Systems   Review of Systems Level 5 caveat for dementia Physical Exam Updated Vital Signs BP (!) 153/83 (BP Location: Right Arm)   Pulse 71   Temp 98 F (36.7 C) (Oral)   Resp 16   SpO2 100%  Physical Exam Vitals and nursing note reviewed.  Constitutional:      General: He is not in acute distress.    Appearance: He is well-developed. He is not diaphoretic.  HENT:     Head: Normocephalic and atraumatic.  Cardiovascular:     Rate and Rhythm: Normal rate and regular rhythm.     Heart  sounds: Normal heart sounds. No murmur heard. Pulmonary:     Effort: Pulmonary effort is normal.     Breath sounds: Normal breath sounds.  Chest:     Chest wall: No tenderness.  Abdominal:     Palpations: Abdomen is soft.     Tenderness: There is no abdominal tenderness.  Musculoskeletal:     Right lower leg: No tenderness. No edema.     Left lower leg: No tenderness. No edema.  Skin:    General: Skin is warm and dry.  Neurological:     Mental Status: He is alert. Mental status is at baseline.  Psychiatric:        Behavior: Behavior normal.     ED Results / Procedures / Treatments   Labs (all labs ordered are listed, but only abnormal results are displayed) Labs Reviewed  BASIC METABOLIC PANEL - Abnormal; Notable for the following components:      Result Value   BUN 7 (*)    All other components within normal limits  RESP PANEL BY RT-PCR (RSV, FLU A&B, COVID)  [id number]  CBC WITH DIFFERENTIAL/PLATELET  BRAIN NATRIURETIC PEPTIDE  LEVETIRACETAM LEVEL  TROPONIN I (HIGH SENSITIVITY)  TROPONIN I (HIGH SENSITIVITY)    EKG EKG Interpretation  Date/Time:  Wednesday December 07 2022 22:31:37 EST Ventricular  Rate:  97 PR Interval:  140 QRS Duration: 76 QT Interval:  336 QTC Calculation: 426 R Axis:   224 Text Interpretation: Normal sinus rhythm Right superior axis deviation Minimal voltage criteria for LVH, may be normal variant ( R in aVL ) Inferior infarct , age undetermined Anterior infarct , age undetermined Abnormal ECG When compared with ECG of 29-Mar-2022 05:59, PREVIOUS ECG IS PRESENT Confirmed by Virgina Norfolk [phone]) on 12/08/2022 9:04:22 AM  Radiology DG Chest 2 View  Result Date: 12/07/2022 CLINICAL DATA:  Central chest pain, mild shortness of breath EXAM: CHEST - 2 VIEW COMPARISON:  07/05/2021 FINDINGS: Frontal and lateral views of the chest demonstrate a stable cardiac silhouette. Lung volumes are diminished, with chronic elevation of the left hemidiaphragm. No  acute airspace disease, effusion, or pneumothorax. No acute bony abnormalities. IMPRESSION: 1. No acute intrathoracic process. Electronically Signed   By: Sharlet Salina M.D.   On: 12/07/2022 22:45    Procedures Procedures    Medications Ordered in ED Medications - No data to display  ED Course/ Medical Decision Making/ A&P                           Medical Decision Making  This patient presents to the ED for concern of chest pain, this involves an extensive number of treatment options, and is a complaint that carries with it a high risk of complications and morbidity.  The differential diagnosis includes but not limited to ACS, gastritis, musculoskeletal pain   Co morbidities that complicate the patient evaluation  CVA, dementia, seizures, hypertension   Additional history obtained:  Additional history obtained from wife who provides history as above External records from outside source obtained and reviewed including labs on file for comparison   Lab Tests:  I Ordered, and personally interpreted labs.  The pertinent results include: CBC unremarkable.  BNP normal, viral panel negative for COVID, flu, RSV.  BMP unremarkable.  Troponins flat and unchanged.   Imaging Studies ordered:  I ordered imaging studies including chest x-ray I independently visualized and interpreted imaging which showed no acute process I agree with the radiologist interpretation   Cardiac Monitoring: / EKG:  The patient was maintained on a cardiac monitor.  I personally viewed and interpreted the cardiac monitored which showed an underlying rhythm of: NSR, rate 97  Problem List / ED Course / Critical interventions / Medication management  66 year old male brought in by EMS from home with wife at bedside after chest pain last night.  EMS provided nitro and aspirin.  Pain resolved while patient had extensive wait in the lobby.  His workup is reassuring.  He denies complaints at this time.  Exam is  unremarkable.  Wife agrees with plan for discharge at this time to follow-up with PCP tomorrow as scheduled. I have reviewed the patients home medicines and have made adjustments as needed   Social Determinants of Health:  Lives with wife who is his caretaker   Test / Admission - Considered:  Symptoms resolved, lab work reassuring, felt stable for discharge recheck with PCP tomorrow as scheduled.         Final Clinical Impression(s) / ED Diagnoses Final diagnoses:  Nonspecific chest pain    Rx / DC Orders ED Discharge Orders     None         Jeannie Fend, PA-C 12/08/22 1527    Virgina Norfolk, DO 12/08/22 1543

## 2022-12-08 NOTE — Discharge Instructions (Signed)
Follow-up with your doctor tomorrow as scheduled.  Return to ER for worsening or concerning symptoms.

## 2022-12-08 NOTE — ED Notes (Signed)
Patient denies pain and is resting comfortably.  

## 2022-12-09 ENCOUNTER — Ambulatory Visit: Payer: No Typology Code available for payment source | Admitting: Family Medicine

## 2022-12-09 ENCOUNTER — Other Ambulatory Visit (HOSPITAL_BASED_OUTPATIENT_CLINIC_OR_DEPARTMENT_OTHER): Payer: Self-pay

## 2022-12-09 ENCOUNTER — Encounter: Payer: Self-pay | Admitting: Family Medicine

## 2022-12-09 VITALS — BP 150/90 | HR 92 | Temp 98.4°F | Ht 65.0 in

## 2022-12-09 DIAGNOSIS — R079 Chest pain, unspecified: Secondary | ICD-10-CM | POA: Diagnosis not present

## 2022-12-09 DIAGNOSIS — H1131 Conjunctival hemorrhage, right eye: Secondary | ICD-10-CM | POA: Diagnosis not present

## 2022-12-09 DIAGNOSIS — R9431 Abnormal electrocardiogram [ECG] [EKG]: Secondary | ICD-10-CM

## 2022-12-09 DIAGNOSIS — I10 Essential (primary) hypertension: Secondary | ICD-10-CM | POA: Diagnosis not present

## 2022-12-09 LAB — LEVETIRACETAM LEVEL: Levetiracetam Lvl: 7.3 ug/mL — ABNORMAL LOW (ref 10.0–40.0)

## 2022-12-09 MED ORDER — AMLODIPINE BESYLATE 2.5 MG PO TABS
2.5000 mg | ORAL_TABLET | Freq: Every day | ORAL | 1 refills | Status: DC
  Filled 2022-12-09: qty 90, 90d supply, fill #0

## 2022-12-09 NOTE — Patient Instructions (Addendum)
I will refer you to cardiology to discuss prior chest pain and EKG. If chest pain returns be seen right away.  Redness in the eye appears to be subconjunctival hemorrhage, that should improve with time.  See information below. Blood pressure has been elevated recently. We should restart amlodipine for now. Keep a record of your blood pressures outside of the office and bring them to the next office visit.  Subconjunctival Hemorrhage Subconjunctival hemorrhage is bleeding that happens between the white part of your eye (sclera) and the clear membrane that covers the outside of your eye (conjunctiva). There are many tiny blood vessels near the surface of your eye. A subconjunctival hemorrhage happens when one or more of these vessels breaks and bleeds, causing a red patch to appear on your eye. This is similar to a bruise. Depending on the amount of bleeding, the red patch may only cover a small area of your eye or it may cover the entire visible part of the sclera. If a lot of blood collects under the conjunctiva, there may also be swelling. Subconjunctival hemorrhages do not affect your vision or cause pain, but your eye may feel irritated if there is swelling. Subconjunctival hemorrhages usually do not require treatment, and they usually disappear on their own within two to four weeks. What are the causes? This condition may be caused by: Mild trauma, such as rubbing your eye too hard. Blunt injuries, such as from playing sports or coming into contact with a deployed airbag. Coughing, sneezing, or vomiting. Straining, such as when lifting a heavy object. Medical conditions, such as: High blood pressure. Diabetes. Recent eye surgery. Certain medicines, especially blood thinners (anticoagulants), including aspirin. Other conditions, such as eye tumors, bleeding disorders, or blood vessel abnormalities. Subconjunctival hemorrhages can also happen without an obvious cause. What are the signs or  symptoms? Symptoms of this condition include: A bright red or dark red patch on the white part of the eye. The red area may: Spread out to cover a larger area of the eye before it goes away. Turn colors such as pink or brownish-yellow before it goes away. Swelling around the eye. Mild eye irritation. How is this diagnosed? This condition is diagnosed with a physical exam. If your subconjunctival hemorrhage was caused by trauma, your health care provider may refer you to an eye specialist (ophthalmologist) or another specialist to check for other injuries. You may have other tests, including: An eye exam including a vision test, checking your eye with a type of microscope (slit lamp) and measuring the pressure in your eye. Your eye may be dilated, especially if your subconjunctival hemorrhage was caused by trauma. A blood pressure check. Blood tests to check for bleeding disorders. If your subconjunctival hemorrhage was caused by trauma, X-rays or a CT scan may be done to check for other injuries. How is this treated? Usually, treatment is not needed for this condition. If you have discomfort, your health care provider may recommend eye drops or cold compresses. Follow these instructions at home: Take over-the-counter and prescription medicines only as directed by your health care provider. Use eye drops or cold compresses to help with discomfort as directed by your health care provider. Avoid activities, things, and environments that may irritate or injure your eye. Keep all follow-up visits. This is important. Contact a health care provider if: You have pain in your eye. The bleeding does not go away within 4 weeks. You keep getting new subconjunctival hemorrhages. Get help right away if:  Your vision changes, you have difficulty seeing, or you develop double vision. You suddenly develop severe sensitivity to light. You develop a severe headache, persistent vomiting, confusion, or abnormal  tiredness (lethargy). Your eye seems to bulge or protrude from your eye socket. You develop unexplained bruises on your body. You have unexplained bleeding in another area of your body. These symptoms may represent a serious problem that is an emergency. Do not wait to see if the symptoms will go away. Get medical help right away. Call your local emergency services (911 in the [place] Do not drive yourself to the hospital. Summary Subconjunctival hemorrhage is bleeding that happens between the white part of your eye and the clear membrane that covers the outside of your eye. This condition is similar to a bruise. Subconjunctival hemorrhages usually do not require treatment, and they usually disappear on their own within two to four weeks. Use eye drops or cold compresses to help with discomfort as directed by your health care provider. This information is not intended to replace advice given to you by your health care provider. Make sure you discuss any questions you have with your health care provider. Document Revised: 02/03/2021 Document Reviewed: 02/03/2021 Elsevier Patient Education  2023 [employer]   Nonspecific Chest Pain, Adult Chest pain is an uncomfortable, tight, or painful feeling in the chest. The pain can feel like a crushing, aching, or squeezing pressure. A person can feel a burning or tingling sensation. Chest pain can also be felt in your back, neck, jaw, shoulder, or arm. This pain can be worse when you move, sneeze, or take a deep breath. Chest pain can be caused by a condition that is life-threatening. This must be treated right away. It can also be caused by something that is not life-threatening. If you have chest pain, it can be hard to know the difference, so it is important to get help right away to make sure that you do not have a serious condition. Some life-threatening causes of chest pain include: Heart attack. A tear in the body's main blood vessel (aortic  dissection). Inflammation around your heart (pericarditis). A problem in the lungs, such as a blood clot (pulmonary embolism) or a collapsed lung (pneumothorax). Some non life-threatening causes of chest pain include: Heartburn. Anxiety or stress. Damage to the bones, muscles, and cartilage that make up your chest wall. Pneumonia or bronchitis. Shingles infection (varicella-zoster virus). Your chest pain may come and go. It may also be constant. Your health care provider will do tests and other studies to find the cause of your pain. Treatment will depend on the cause of your chest pain. Follow these instructions at home: Medicines Take over-the-counter and prescription medicines only as told by your health care provider. If you were prescribed an antibiotic medicine, take it as told by your health care provider. Do not stop taking the antibiotic even if you start to feel better. Activity Avoid any activities that cause chest pain. Do not lift anything that is heavier than 10 lb (4.5 kg), or the limit that you are told, until your health care provider says that it is safe. Rest as directed by your health care provider. Return to your normal activities only as told by your health care provider. Ask your health care provider what activities are safe for you. Lifestyle     Do not use any products that contain nicotine or tobacco, such as cigarettes, e-cigarettes, and chewing tobacco. If you need help quitting, ask your health  care provider. Do not drink alcohol. Make healthy lifestyle changes as recommended. These may include: Getting regular exercise. Ask your health care provider to suggest some exercises that are safe for you. Eating a heart-healthy diet. This includes plenty of fresh fruits and vegetables, whole grains, low-fat (lean) protein, and low-fat dairy products. A dietitian can help you find healthy eating options. Maintaining a healthy weight. Managing any other health  conditions you may have, such as high blood pressure (hypertension) or diabetes. Reducing stress, such as with yoga or relaxation techniques. General instructions Pay attention to any changes in your symptoms. It is up to you to get the results of any tests that were done. Ask your health care provider, or the department that is doing the tests, when your results will be ready. Keep all follow-up visits as told by your health care provider. This is important. You may be asked to go for further testing if your chest pain does not go away. Contact a health care provider if: Your chest pain does not go away. You feel depressed. You have a fever. You notice changes in your symptoms or develop new symptoms. Get help right away if: Your chest pain gets worse. You have a cough that gets worse, or you cough up blood. You have severe pain in your abdomen. You faint. You have sudden, unexplained chest discomfort. You have sudden, unexplained discomfort in your arms, back, neck, or jaw. You have shortness of breath at any time. You suddenly start to sweat, or your skin gets clammy. You feel nausea or you vomit. You suddenly feel lightheaded or dizzy. You have severe weakness, or unexplained weakness or fatigue. Your heart begins to beat quickly, or it feels like it is skipping beats. These symptoms may represent a serious problem that is an emergency. Do not wait to see if the symptoms will go away. Get medical help right away. Call your local emergency services (911 in the [place] Do not drive yourself to the hospital. Summary Chest pain can be caused by a condition that is serious and requires urgent treatment. It may also be caused by something that is not life-threatening. Your health care provider may do lab tests and other studies to find the cause of your pain. Follow your health care provider's instructions on taking medicines, making lifestyle changes, and getting emergency treatment if  symptoms become worse. Keep all follow-up visits as told by your health care provider. This includes visits for any further testing if your chest pain does not go away. This information is not intended to replace advice given to you by your health care provider. Make sure you discuss any questions you have with your health care provider. Document Revised: 02/11/2021 Document Reviewed: 02/11/2021 Elsevier Patient Education  2023 [employer].

## 2022-12-09 NOTE — Progress Notes (Signed)
Subjective:  Patient ID: Donald [name], male    DOB: 21-Jul-1956  Age: 66 y.o. MRN: [id number]  CC:  Chief Complaint  Patient presents with   right eye red    Pt states his right eye is red, has been red since red since Monday     HPI Corrado Hymon presents for   Right eye redness: Started approximately 2-3 days ago. feels different. No d/c. No pain, able to see ok. No treatments.  May have rubbed eye but no other known injury.  Chest pain ER follow-up.  Seen 12/07/2022.  Brought in by EMS after chest pain night prior.  EMS provided nitroglycerin and aspirin.  Pain resolved during his wait in the lobby, workup was reassuring and denied chest pain complaints at time of evaluation by ER provider.  Chest x-ray without acute intrathoracic process.  BNP, troponin x 2 normal.  BMP, CBC overall reassuring.  Respiratory panel including flu, RSV, COVID-negative. EKG:   Normal sinus rhythm Right superior axis deviation Minimal voltage criteria for LVH, may be normal variant ( R in aVL ) Inferior infarct , age undetermined Anterior infarct , age undetermined  He does have a history of CVA in 2022, right frontal parenchymal hemorrhage and subarachnoid hemorrhage, spastic hemiparesis, epilepsy followed by neurology and physical medicine and rehab specialist.  Does not have a cardiologist. No recurrence of chest pain.  Initial sx of chest pain was pressure, associated nausea and shortness of breath. Ntg and ASA in ambulance helped pain.  Echo in 5/22 - EF 65-70%, no wall motion abnormalities.  Elevated blood pressures recently. No current antihypertensive.  BP Readings from Last 3 Encounters:  12/09/22 (!) 150/90  12/08/22 (!) 153/83  11/23/22 (!) 178/106     History Patient Active Problem List   Diagnosis Date Noted   Dementia (HCC) 03/29/2022   H/O: CVA (cerebrovascular accident) 03/29/2022   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) 02/09/2022   At high risk for inadequate  nutritional intake 07/12/2021   Transaminitis 06/06/2021   Intraparenchymal hematoma of brain (HCC) 06/04/2021   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HCC) 05/30/2021   Right lower lobe pneumonia 05/30/2021   Prediabetes 05/30/2021   Hyperglycemia 05/30/2021   Intracerebral hemorrhage 05/24/2021   Seizure (HCC) 05/23/2021   Malnutrition of moderate degree 05/22/2021   Presence of other vascular implants and grafts 05/16/2021   Memory deficits    OSA (obstructive sleep apnea)    Generalized OA    Hypokalemia    Leukocytosis    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 (HCC) 05/11/2021   Unspecified dementia, unspecified severity, without behavioral disturbance, psychotic disturbance, mood disturbance, and anxiety (HCC) 12/12/2020   Essential (primary) hypertension 12/13/2019   Personal history of other venous thrombosis and embolism 05/27/2019   Annual physical exam 04/03/2017   Past Medical History:  Diagnosis Date   Arthritis    Stroke [facility])    Past Surgical History:  Procedure Laterality Date   APPENDECTOMY     IR ANGIO INTRA EXTRACRAN SEL COM CAROTID INNOMINATE BILAT MOD SED  05/28/2021   IR ANGIO VERTEBRAL SEL VERTEBRAL BILAT MOD SED  05/28/2021   IR IVC FILTER PLMT / S&I /IMG GUID/MOD SED  05/27/2021   IR US GUIDE VASC ACCESS RIGHT  05/27/2021   Allergies  Allergen Reactions   Sulfa Antibiotics Other (See Comments)    Nausea and eye swelling    Zanaflex [Tizanidine]     lethargy   Prior to Admission medications  Medication Sig Start Date End Date Taking? Authorizing Provider  Lacosamide 100 MG TABS Take 1 tablet (100 mg total) by mouth 2 (two) times daily. 11/30/22 11/25/23 Yes Micki RileySethi, Pramod S, MD  levETIRAcetam (KEPPRA) 100 MG/ML solution Take 2.5 mLs (250 mg total) by mouth 2 (two) times daily. 11/10/22 11/05/23 Yes Micki RileySethi, Pramod S, MD  Multiple Vitamin (MULTIVITAMIN) tablet Take 1 tablet by mouth daily.   Yes [provider]  Lacosamide 100 MG TABS  Take 1 tablet by mouth 2 times daily Patient not taking: Reported on 12/09/2022 12/02/22   Micki RileySethi, Pramod S, MD  levETIRAcetam (KEPPRA) 100 MG/ML solution Take 2.5 mLs (250 mg total) by mouth 2 (two) times daily. Patient not taking: Reported on 12/09/2022 12/06/22 03/06/23  Micki RileySethi, Pramod S, MD   Social History   Socioeconomic History   Marital status: Married    Spouse name: Not on file   Number of children: Not on file   Years of education: Not on file   Highest education level: Not on file  Occupational History   Not on file  Tobacco Use   Smoking status: Never   Smokeless tobacco: Never  Vaping Use   Vaping Use: Never used  Substance and Sexual Activity   Alcohol use: No   Drug use: No   Sexual activity: Yes    Birth control/protection: None  Other Topics Concern   Not on file  Social History Narrative   Not on file   Social Determinants of Health   Financial Resource Strain: Not on file  Food Insecurity: Not on file  Transportation Needs: Not on file  Physical Activity: Not on file  Stress: Not on file  Social Connections: Not on file  Intimate Partner Violence: Not on file    Review of Systems  Per HPI.  Objective:   Vitals:   12/09/22 0921 12/09/22 0939 12/09/22 1016  BP: (!) 150/80 (!) 150/90 (!) 150/90  Pulse: 92    Temp: 98.4 F (36.9 C)    SpO2: 99%    Height: 5\' 5"  (1.651 m)       Physical Exam Vitals reviewed.  Constitutional:      Appearance: He is well-developed. He is not ill-appearing or diaphoretic.     Comments:  In W/c. No distress.   HENT:     Head: Normocephalic and atraumatic.  Eyes:   Neck:     Vascular: No carotid bruit or JVD.  Cardiovascular:     Rate and Rhythm: Normal rate and regular rhythm.     Heart sounds: Normal heart sounds. No murmur heard. Pulmonary:     Effort: Pulmonary effort is normal.     Breath sounds: Normal breath sounds. No rales.  Musculoskeletal:     Right lower leg: No edema.     Left lower leg:  No edema.  Skin:    General: Skin is warm and dry.  Neurological:     Mental Status: He is alert and oriented to person, place, and time.  Psychiatric:        Mood and Affect: Mood normal.        Assessment & Plan:  Melanee Leftmmanuel Diebold is a 66 y.o. male . Chest pain, unspecified type - Plan: Ambulatory referral to Cardiology Abnormal EKG - Plan: Ambulatory referral to Cardiology  -Asymptomatic at present.  Previous evaluation reassuring.  Referred to cardiology for further evaluation with return to clinic/ER/911 precautions discussed.  Subconjunctival hemorrhage of right eye  -Subconjunctival hemorrhage without known injury  and denies visual symptoms.  Expectant management for healing, no other sign of eye injury on exam.  RTC precautions  Hypertension, unspecified type - Plan: amLODipine (NORVASC) 2.5 MG tablet  -Elevated BP as above, start low-dose amlodipine with potential side effects discussed.  Meds ordered this encounter  Medications   amLODipine (NORVASC) 2.5 MG tablet    Sig: Take 1 tablet (2.5 mg total) by mouth daily.    Dispense:  90 tablet    Refill:  1   Patient Instructions  I will refer you to cardiology to discuss prior chest pain and EKG. If chest pain returns be seen right away.  Redness in the eye appears to be subconjunctival hemorrhage, that should improve with time.  See information below. Blood pressure has been elevated recently. We should restart amlodipine for now. Keep a record of your blood pressures outside of the office and bring them to the next office visit.  Subconjunctival Hemorrhage Subconjunctival hemorrhage is bleeding that happens between the white part of your eye (sclera) and the clear membrane that covers the outside of your eye (conjunctiva). There are many tiny blood vessels near the surface of your eye. A subconjunctival hemorrhage happens when one or more of these vessels breaks and bleeds, causing a red patch to appear on your eye.  This is similar to a bruise. Depending on the amount of bleeding, the red patch may only cover a small area of your eye or it may cover the entire visible part of the sclera. If a lot of blood collects under the conjunctiva, there may also be swelling. Subconjunctival hemorrhages do not affect your vision or cause pain, but your eye may feel irritated if there is swelling. Subconjunctival hemorrhages usually do not require treatment, and they usually disappear on their own within two to four weeks. What are the causes? This condition may be caused by: Mild trauma, such as rubbing your eye too hard. Blunt injuries, such as from playing sports or coming into contact with a deployed airbag. Coughing, sneezing, or vomiting. Straining, such as when lifting a heavy object. Medical conditions, such as: High blood pressure. Diabetes. Recent eye surgery. Certain medicines, especially blood thinners (anticoagulants), including aspirin. Other conditions, such as eye tumors, bleeding disorders, or blood vessel abnormalities. Subconjunctival hemorrhages can also happen without an obvious cause. What are the signs or symptoms? Symptoms of this condition include: A bright red or dark red patch on the white part of the eye. The red area may: Spread out to cover a larger area of the eye before it goes away. Turn colors such as pink or brownish-yellow before it goes away. Swelling around the eye. Mild eye irritation. How is this diagnosed? This condition is diagnosed with a physical exam. If your subconjunctival hemorrhage was caused by trauma, your health care provider may refer you to an eye specialist (ophthalmologist) or another specialist to check for other injuries. You may have other tests, including: An eye exam including a vision test, checking your eye with a type of microscope (slit lamp) and measuring the pressure in your eye. Your eye may be dilated, especially if your subconjunctival hemorrhage  was caused by trauma. A blood pressure check. Blood tests to check for bleeding disorders. If your subconjunctival hemorrhage was caused by trauma, X-rays or a CT scan may be done to check for other injuries. How is this treated? Usually, treatment is not needed for this condition. If you have discomfort, your health care provider may recommend eye  drops or cold compresses. Follow these instructions at home: Take over-the-counter and prescription medicines only as directed by your health care provider. Use eye drops or cold compresses to help with discomfort as directed by your health care provider. Avoid activities, things, and environments that may irritate or injure your eye. Keep all follow-up visits. This is important. Contact a health care provider if: You have pain in your eye. The bleeding does not go away within 4 weeks. You keep getting new subconjunctival hemorrhages. Get help right away if: Your vision changes, you have difficulty seeing, or you develop double vision. You suddenly develop severe sensitivity to light. You develop a severe headache, persistent vomiting, confusion, or abnormal tiredness (lethargy). Your eye seems to bulge or protrude from your eye socket. You develop unexplained bruises on your body. You have unexplained bleeding in another area of your body. These symptoms may represent a serious problem that is an emergency. Do not wait to see if the symptoms will go away. Get medical help right away. Call your local emergency services (911 in the [place] Do not drive yourself to the hospital. Summary Subconjunctival hemorrhage is bleeding that happens between the white part of your eye and the clear membrane that covers the outside of your eye. This condition is similar to a bruise. Subconjunctival hemorrhages usually do not require treatment, and they usually disappear on their own within two to four weeks. Use eye drops or cold compresses to help with  discomfort as directed by your health care provider. This information is not intended to replace advice given to you by your health care provider. Make sure you discuss any questions you have with your health care provider. Document Revised: 02/03/2021 Document Reviewed: 02/03/2021 Elsevier Patient Education  2023 [employer] Inc[employer]   Nonspecific Chest Pain, Adult Chest pain is an uncomfortable, tight, or painful feeling in the chest. The pain can feel like a crushing, aching, or squeezing pressure. A person can feel a burning or tingling sensation. Chest pain can also be felt in your back, neck, jaw, shoulder, or arm. This pain can be worse when you move, sneeze, or take a deep breath. Chest pain can be caused by a condition that is life-threatening. This must be treated right away. It can also be caused by something that is not life-threatening. If you have chest pain, it can be hard to know the difference, so it is important to get help right away to make sure that you do not have a serious condition. Some life-threatening causes of chest pain include: Heart attack. A tear in the body's main blood vessel (aortic dissection). Inflammation around your heart (pericarditis). A problem in the lungs, such as a blood clot (pulmonary embolism) or a collapsed lung (pneumothorax). Some non life-threatening causes of chest pain include: Heartburn. Anxiety or stress. Damage to the bones, muscles, and cartilage that make up your chest wall. Pneumonia or bronchitis. Shingles infection (varicella-zoster virus). Your chest pain may come and go. It may also be constant. Your health care provider will do tests and other studies to find the cause of your pain. Treatment will depend on the cause of your chest pain. Follow these instructions at home: Medicines Take over-the-counter and prescription medicines only as told by your health care provider. If you were prescribed an antibiotic medicine, take it as told  by your health care provider. Do not stop taking the antibiotic even if you start to feel better. Activity Avoid any activities that cause chest pain. Do  not lift anything that is heavier than 10 lb (4.5 kg), or the limit that you are told, until your health care provider says that it is safe. Rest as directed by your health care provider. Return to your normal activities only as told by your health care provider. Ask your health care provider what activities are safe for you. Lifestyle     Do not use any products that contain nicotine or tobacco, such as cigarettes, e-cigarettes, and chewing tobacco. If you need help quitting, ask your health care provider. Do not drink alcohol. Make healthy lifestyle changes as recommended. These may include: Getting regular exercise. Ask your health care provider to suggest some exercises that are safe for you. Eating a heart-healthy diet. This includes plenty of fresh fruits and vegetables, whole grains, low-fat (lean) protein, and low-fat dairy products. A dietitian can help you find healthy eating options. Maintaining a healthy weight. Managing any other health conditions you may have, such as high blood pressure (hypertension) or diabetes. Reducing stress, such as with yoga or relaxation techniques. General instructions Pay attention to any changes in your symptoms. It is up to you to get the results of any tests that were done. Ask your health care provider, or the department that is doing the tests, when your results will be ready. Keep all follow-up visits as told by your health care provider. This is important. You may be asked to go for further testing if your chest pain does not go away. Contact a health care provider if: Your chest pain does not go away. You feel depressed. You have a fever. You notice changes in your symptoms or develop new symptoms. Get help right away if: Your chest pain gets worse. You have a cough that gets worse, or  you cough up blood. You have severe pain in your abdomen. You faint. You have sudden, unexplained chest discomfort. You have sudden, unexplained discomfort in your arms, back, neck, or jaw. You have shortness of breath at any time. You suddenly start to sweat, or your skin gets clammy. You feel nausea or you vomit. You suddenly feel lightheaded or dizzy. You have severe weakness, or unexplained weakness or fatigue. Your heart begins to beat quickly, or it feels like it is skipping beats. These symptoms may represent a serious problem that is an emergency. Do not wait to see if the symptoms will go away. Get medical help right away. Call your local emergency services (911 in the [place] Do not drive yourself to the hospital. Summary Chest pain can be caused by a condition that is serious and requires urgent treatment. It may also be caused by something that is not life-threatening. Your health care provider may do lab tests and other studies to find the cause of your pain. Follow your health care provider's instructions on taking medicines, making lifestyle changes, and getting emergency treatment if symptoms become worse. Keep all follow-up visits as told by your health care provider. This includes visits for any further testing if your chest pain does not go away. This information is not intended to replace advice given to you by your health care provider. Make sure you discuss any questions you have with your health care provider. Document Revised: 02/11/2021 Document Reviewed: 02/11/2021 Elsevier Patient Education  2023 [employer] Inc.       Signed,   Meredith Staggers, MD [facility], [facility] 12/09/22 10:19 AM

## 2022-12-10 ENCOUNTER — Encounter: Payer: Self-pay | Admitting: Family Medicine

## 2022-12-26 ENCOUNTER — Ambulatory Visit: Payer: No Typology Code available for payment source | Admitting: Family Medicine

## 2023-01-04 ENCOUNTER — Ambulatory Visit
Payer: No Typology Code available for payment source | Attending: Cardiovascular Disease | Admitting: Cardiovascular Disease

## 2023-01-04 ENCOUNTER — Other Ambulatory Visit (HOSPITAL_BASED_OUTPATIENT_CLINIC_OR_DEPARTMENT_OTHER): Payer: Self-pay

## 2023-01-04 ENCOUNTER — Encounter: Payer: Self-pay | Admitting: Cardiovascular Disease

## 2023-01-04 VITALS — BP 170/85 | HR 93

## 2023-01-04 DIAGNOSIS — I10 Essential (primary) hypertension: Secondary | ICD-10-CM

## 2023-01-04 DIAGNOSIS — R079 Chest pain, unspecified: Secondary | ICD-10-CM | POA: Diagnosis not present

## 2023-01-04 MED ORDER — NITROGLYCERIN 0.4 MG SL SUBL
SUBLINGUAL_TABLET | SUBLINGUAL | 6 refills | Status: DC
  Filled 2023-01-04 (×2): qty 25, 30d supply, fill #0
  Filled 2023-01-07: qty 25, 5d supply, fill #0

## 2023-01-04 NOTE — Progress Notes (Signed)
Cardiology Office Note:    Date:  01/04/2023   ID:  Donald [name], DOB 01-Mar-1956, MRN [id number]  PCP:  Donald Agreste, MD   [facility] Cardiologist:  new to [facility]  Click to update primary MD,subspecialty MD or APP then REFRESH:1}    Referring MD: Donald Agreste, MD   Chief Complaint  Patient presents with   Chest Pain    History of Present Illness:    See wih wife  Julyan [name] is a 67 y.o. male with a hx of hypertension and history of chest pain. Originally from [place]    Donald [name] is referred to our office for further evaluation of some chest pain.  He was taken to the hospital by EMS after having chest pain.  The chest pain resolved while in the lobby.  Workup was negative.  Troponin x 2 was negative.  ECG has significant artifact which makes interpretation difficult but he has poor R wave progression,?  Previous anterior wall myocardial infarction.  He does have a history of a CVA.  He has a history of epilepsy and is followed by neurology.  Also has moderate dementia    All of his history is provided by his wife.  On the night of his hospitalization he reported chest pain and was breathing heavily.   When was his he had a stroke May 11, 2021.  Since that time he has been bedbound or wheelchair-bound.  His wife has to do all the transferring.  BP has been elevated for several weeks .  Dr. Carlota [name] started him back on Amlodipine   Has gained weight over the past several months  Wears depends    Past Medical History:  Diagnosis Date   Arthritis    Stroke [facility])     Past Surgical History:  Procedure Laterality Date   APPENDECTOMY     IR ANGIO INTRA EXTRACRAN SEL COM CAROTID INNOMINATE BILAT MOD SED  05/28/2021   IR ANGIO VERTEBRAL SEL VERTEBRAL BILAT MOD SED  05/28/2021   IR IVC FILTER PLMT / S&I /IMG GUID/MOD SED  05/27/2021   IR US GUIDE VASC ACCESS RIGHT  05/27/2021    Current Medications: Current Meds  Medication  Sig   amLODipine (NORVASC) 2.5 MG tablet Take 1 tablet (2.5 mg total) by mouth daily.   Lacosamide 100 MG TABS Take 1 tablet (100 mg total) by mouth 2 (two) times daily.   levETIRAcetam (KEPPRA) 100 MG/ML solution Take 2.5 mLs (250 mg total) by mouth 2 (two) times daily.   Multiple Vitamin (MULTIVITAMIN) tablet Take 1 tablet by mouth daily.   nitroGLYCERIN (NITROSTAT) 0.4 MG SL tablet Dissolve 1 tablet under the tongue every 5 minutes as needed for chest pain. Max of 3 doses, then 911.     Allergies:   Sulfa antibiotics and Zanaflex [tizanidine]   Social History   Socioeconomic History   Marital status: Married    Spouse name: Not on file   Number of children: Not on file   Years of education: Not on file   Highest education level: Not on file  Occupational History   Not on file  Tobacco Use   Smoking status: Never   Smokeless tobacco: Never  Vaping Use   Vaping Use: Never used  Substance and Sexual Activity   Alcohol use: No   Drug use: No   Sexual activity: Yes    Birth control/protection: None  Other Topics Concern   Not on file  Social  History Narrative   Not on file   Social Determinants of Health   Financial Resource Strain: Not on file  Food Insecurity: Not on file  Transportation Needs: Not on file  Physical Activity: Not on file  Stress: Not on file  Social Connections: Not on file     Family History: The patient's family history includes Hypertension in his mother. There is no history of Colon polyps, Colon cancer, Esophageal cancer, Rectal cancer, or Stomach cancer.  ROS:   Please see the history of present illness.     All other systems reviewed and are negative.  EKGs/Labs/Other Studies Reviewed:    The following studies were reviewed today:   EKG:   Dec. 27, 2023:  significant artifact .  NSR ,  poor R wave progression   Recent Labs: 04/06/2022: Magnesium 2.1 10/04/2022: ALT 17 12/07/2022: B Natriuretic Peptide 11.2; BUN 7; Creatinine, Ser  0.96; Hemoglobin 14.7; Platelets 265; Potassium 3.8; Sodium 137  Recent Lipid Panel    Component Value Date/Time   CHOL 232 (H) 05/11/2021 1118   CHOL 204 (H) 09/11/2020 1449   TRIG 73 05/11/2021 1118   HDL 77 05/11/2021 1118   HDL 63 09/11/2020 1449   CHOLHDL 3.0 05/11/2021 1118   VLDL 15 05/11/2021 1118   LDLCALC 140 (H) 05/11/2021 1118   LDLCALC 132 (H) 09/11/2020 1449   LDLCALC 107 (H) 04/29/2020 0826     Risk Assessment/Calculations:      HYPERTENSION CONTROL Vitals:   01/04/23 1545 01/04/23 1625  BP: (!) 172/85 (!) 170/85    The patient's blood pressure is elevated above target today.  In order to address the patient's elevated BP: Blood pressure will be monitored at home to determine if medication changes need to be made.            Physical Exam:    VS:  BP (!) 170/85   Pulse 93   SpO2 98%     Wt Readings from Last 3 Encounters:  10/04/22 128 lb 15.5 oz (58.5 kg)  04/06/22 128 lb 14.4 oz (58.5 kg)  03/27/22 150 lb (68 kg)     GEN: Elderly gentleman, sitting upright in a wheelchair.  His body is completely contracted.  He is unable to move in any way.  He does shake his arm when trying to take a blood pressure.  HEENT: Normal NECK: No JVD; LYMPHATICS:  CARDIAC: RRR, no murmurs, rubs, gallops RESPIRATORY: Poor inspiratory effort ABDOMEN: Unable to completely assess.  Nontender MUSCULOSKELETAL: Arms and legs are very rigid. NEUROLOGIC: He is completely contracted following his stroke. PSYCHIATRIC: Moderately demented.  He can respond to questions.  Mostly he repeats words that he is heard.  ASSESSMENT:    1. Primary hypertension   2. Chest pain of uncertain etiology    PLAN:    In order of problems listed above:  Chest pain: Donald [name] presents for further evaluation of chest pain.  He was seen in the emergency room and had negative troponin levels.  There was some concern of a previous anterior wall myocardial infarction on EKG.   Unfortunately,  his overall health has continued to decline since his stroke.  He has been bedbound/wheelchair-bound for several years.  His body is completely contracted.  He is on but able to ambulate.  He also has moderate dementia and is unable to communicate.  He is not a candidate for any invasive procedures.  I have given the wife a prescription for nitroglycerin to give him on  an as-needed basis.  Conservative medical therapy/comfort care is indicated if he starts having episodes of severe chest pain.  2.  Hypertension: Blood pressure is elevated here but his wife takes his blood pressure at least every day.  Many of his readings are in the normal range and only a very few are mildly elevated.  At this point I think the best course of action is to to continue with his current dose of amlodipine.  If he starts to have episodes of worsening hypertension then his amlodipine dose can be increased.  We will see on an as needed basis             Medication Adjustments/Labs and Tests Ordered: Current medicines are reviewed at length with the patient today.  Concerns regarding medicines are outlined above.  No orders of the defined types were placed in this encounter.  Meds ordered this encounter  Medications   nitroGLYCERIN (NITROSTAT) 0.4 MG SL tablet    Sig: Dissolve 1 tablet under the tongue every 5 minutes as needed for chest pain. Max of 3 doses, then 911.    Dispense:  25 tablet    Refill:  6     Patient Instructions  Medication Instructions:  START Nitroglycerin 0.4mg  as needed for chest pain *If you need a refill on your cardiac medications before your next appointment, please call your pharmacy*   Lab Work: NONE If you have labs (blood work) drawn today and your tests are completely normal, you will receive your results only by: [place] (if you have MyChart) OR A paper copy in the mail If you have any lab test that is abnormal or we need to change your treatment, we will  call you to review the results.   Testing/Procedures: NONE   Follow-Up: At [facility], you and your health needs are our priority.  As part of our continuing mission to provide you with exceptional heart care, we have created designated Provider Care Teams.  These Care Teams include your primary Cardiologist (physician) and Advanced Practice Providers (APPs -  Physician Assistants and Nurse Practitioners) who all work together to provide you with the care you need, when you need it.  We recommend signing up for the patient portal called "MyChart".  Sign up information is provided on this After Visit Summary.  MyChart is used to connect with patients for Virtual Visits (Telemedicine).  Patients are able to view lab/test results, encounter notes, upcoming appointments, etc.  Non-urgent messages can be sent to your provider as well.   To learn more about what you can do with MyChart, go to [email or web address].    Your next appointment:   As Needed  Provider:   Mertie Moores, MD     Signed, Mertie Moores, MD  01/04/2023 5:52 PM    [place]

## 2023-01-04 NOTE — Patient Instructions (Signed)
Medication Instructions:  START Nitroglycerin 0.4mg  as needed for chest pain *If you need a refill on your cardiac medications before your next appointment, please call your pharmacy*   Lab Work: NONE If you have labs (blood work) drawn today and your tests are completely normal, you will receive your results only by: [place] (if you have MyChart) OR A paper copy in the mail If you have any lab test that is abnormal or we need to change your treatment, we will call you to review the results.   Testing/Procedures: NONE   Follow-Up: At [facility], you and your health needs are our priority.  As part of our continuing mission to provide you with exceptional heart care, we have created designated Provider Care Teams.  These Care Teams include your primary Cardiologist (physician) and Advanced Practice Providers (APPs -  Physician Assistants and Nurse Practitioners) who all work together to provide you with the care you need, when you need it.  We recommend signing up for the patient portal called "MyChart".  Sign up information is provided on this After Visit Summary.  MyChart is used to connect with patients for Virtual Visits (Telemedicine).  Patients are able to view lab/test results, encounter notes, upcoming appointments, etc.  Non-urgent messages can be sent to your provider as well.   To learn more about what you can do with MyChart, go to [email or web address].    Your next appointment:   As Needed  Provider:   Mertie Moores, MD

## 2023-01-07 ENCOUNTER — Other Ambulatory Visit: Payer: Self-pay | Admitting: Neurology

## 2023-01-07 ENCOUNTER — Other Ambulatory Visit (HOSPITAL_BASED_OUTPATIENT_CLINIC_OR_DEPARTMENT_OTHER): Payer: Self-pay

## 2023-01-10 ENCOUNTER — Other Ambulatory Visit (HOSPITAL_BASED_OUTPATIENT_CLINIC_OR_DEPARTMENT_OTHER): Payer: Self-pay

## 2023-01-23 ENCOUNTER — Other Ambulatory Visit: Payer: Self-pay | Admitting: Neurology

## 2023-01-23 ENCOUNTER — Other Ambulatory Visit (HOSPITAL_BASED_OUTPATIENT_CLINIC_OR_DEPARTMENT_OTHER): Payer: Self-pay

## 2023-01-23 MED ORDER — LACOSAMIDE 100 MG PO TABS: 100.0000 mg | ORAL_TABLET | Freq: Two times a day (BID) | ORAL | 0 refills | Status: DC

## 2023-01-23 NOTE — Telephone Encounter (Signed)
Pt wife is calling. Stated pt needs a refill on Lacosamide 100 MG TABS. Stated she would like a three month supply sent in to [facility] [address].

## 2023-01-25 NOTE — Telephone Encounter (Signed)
Wife has called to report that pt has been without this medication for 2 weeks and a few days ago pt had seizure like activity. She is asking that the   Lacosamide 100 MG TABS , still be called into  [facility] [address].

## 2023-01-27 ENCOUNTER — Other Ambulatory Visit (HOSPITAL_BASED_OUTPATIENT_CLINIC_OR_DEPARTMENT_OTHER): Payer: Self-pay

## 2023-02-01 NOTE — Telephone Encounter (Signed)
Wife states the pharmacy is telling her they have not received the refill request.  Wife states they have been told it is denied since 01-31 Wife states pt is now going on week 3 without this medication, please call.

## 2023-02-06 NOTE — Telephone Encounter (Signed)
Pt wife is calling again states pt needs a refill on Lacosamide 100 MG TABS. Stated she has been calling for week trying to get refill. States she needs [facility] [address].

## 2023-02-11 ENCOUNTER — Other Ambulatory Visit (HOSPITAL_BASED_OUTPATIENT_CLINIC_OR_DEPARTMENT_OTHER): Payer: Self-pay

## 2023-02-11 ENCOUNTER — Other Ambulatory Visit: Payer: Self-pay | Admitting: Neurology

## 2023-02-13 ENCOUNTER — Telehealth: Payer: Self-pay | Admitting: Family Medicine

## 2023-02-13 MED ORDER — LACOSAMIDE 100 MG PO TABS: 100.0000 mg | ORAL_TABLET | Freq: Two times a day (BID) | ORAL | 0 refills | Status: DC

## 2023-02-13 NOTE — Telephone Encounter (Signed)
Caller name: Ander Gaster [place]   On DPR?: Yes  Call back number: [phone]  Provider they see: [name], MD  Reason for call: Verbal orders to change as needed or apply as needed. Document intake and output daily.  All faxes come thru email. [email or web address]

## 2023-02-13 NOTE — Telephone Encounter (Signed)
The patient's medication has been printing. I have changed settings to normal. He needs a refill ASAP.

## 2023-02-14 NOTE — Telephone Encounter (Signed)
Called back for more details, Change what? A bandage? Catheter? Depends? Need more details. LM for Angel to call back and discuss with me.

## 2023-02-15 NOTE — Telephone Encounter (Signed)
Per Lorriane Shire this is for a condom catheter can we give orders to replace this when necessary?

## 2023-02-16 NOTE — Telephone Encounter (Signed)
Called provided number, left VM to call back so I can speak with them about the specifics.

## 2023-02-16 NOTE — Telephone Encounter (Signed)
I did not hear back from their facility today.  I did review chart and it appears he has been using condom catheter previously including at home.  Okay to continue condom catheter, with changes in condom catheter in every 24 to no more than 48 hours.

## 2023-02-17 NOTE — Telephone Encounter (Signed)
Called and left another message to get information

## 2023-02-22 ENCOUNTER — Encounter
Payer: Commercial Managed Care - PPO | Attending: Physical Medicine & Rehabilitation | Admitting: Physical Medicine & Rehabilitation

## 2023-02-22 ENCOUNTER — Telehealth: Payer: Self-pay | Admitting: Neurology

## 2023-02-22 ENCOUNTER — Encounter: Payer: Self-pay | Admitting: Physical Medicine & Rehabilitation

## 2023-02-22 VITALS — BP 120/76 | HR 78

## 2023-02-22 DIAGNOSIS — F03B0 Unspecified dementia, moderate, without behavioral disturbance, psychotic disturbance, mood disturbance, and anxiety: Secondary | ICD-10-CM | POA: Insufficient documentation

## 2023-02-22 DIAGNOSIS — I69354 Hemiplegia and hemiparesis following cerebral infarction affecting left non-dominant side: Secondary | ICD-10-CM | POA: Diagnosis present

## 2023-02-22 DIAGNOSIS — G8114 Spastic hemiplegia affecting left nondominant side: Secondary | ICD-10-CM | POA: Diagnosis not present

## 2023-02-22 MED ORDER — LEVETIRACETAM 100 MG/ML PO SOLN: 250.0000 mg | Freq: Two times a day (BID) | ORAL | 3 refills | Status: DC

## 2023-02-22 NOTE — Telephone Encounter (Signed)
Pt wife called and requested a refill on levETIRAcetam (KEPPRA) 100 MG/ML solution. [id number]

## 2023-02-22 NOTE — Progress Notes (Signed)
Subjective:    Patient ID: Donald [name], male    DOB: 02-19-1956, 67 y.o.   MRN: [id number]  HPI  Donald [name] is back regarding his ICH and spastic left hemiparesis. He had positive results with botox as wife noticed that his elbow and fingers were much looser which made it easier to perform hygiene,dressing, etc. He has begun to be a little tighter again however. His neck is still turned to left. Hamstrings are tight too. He had perhaps 2 small seizures after running out of medications despite calling prescriber repeatedly (per wife). He remains on vimpat and keppra currently.   He received his new wheelchair which has made it easier for wife to handle him at home.     Pain Inventory Average Pain 0 Pain Right Now 0 My pain is  no pain  LOCATION OF PAIN  no pain  BOWEL Number of stools per week: 2-3 Oral laxative use No  Type of laxative . Enema or suppository use No  History of colostomy No  Incontinent No   BLADDER Pads In and out cath, frequency . Able to self cath  condom catheter Bladder incontinence No  Frequent urination No  Leakage with coughing No  Difficulty starting stream No  Incomplete bladder emptying No    Mobility ability to climb steps?  no do you drive?  no use a wheelchair needs help with transfers  Function disabled: date disabled 05/11/21 I need assistance with the following:  dressing, bathing, toileting, meal prep, household duties, and shopping  Neuro/Psych weakness  Prior Studies Any changes since last visit?  no  Physicians involved in your care Any changes since last visit?  no   Family History  Problem Relation Age of Onset   Hypertension Mother    Colon polyps Neg Hx    Colon cancer Neg Hx    Esophageal cancer Neg Hx    Rectal cancer Neg Hx    Stomach cancer Neg Hx    Social History   Socioeconomic History   Marital status: Married    Spouse name: Not on file   Number of children: Not on file   Years of education:  Not on file   Highest education level: Not on file  Occupational History   Not on file  Tobacco Use   Smoking status: Never   Smokeless tobacco: Never  Vaping Use   Vaping Use: Never used  Substance and Sexual Activity   Alcohol use: No   Drug use: No   Sexual activity: Yes    Birth control/protection: None  Other Topics Concern   Not on file  Social History Narrative   Not on file   Social Determinants of Health   Financial Resource Strain: Not on file  Food Insecurity: Not on file  Transportation Needs: Not on file  Physical Activity: Not on file  Stress: Not on file  Social Connections: Not on file   Past Surgical History:  Procedure Laterality Date   APPENDECTOMY     IR ANGIO INTRA EXTRACRAN SEL COM CAROTID INNOMINATE BILAT MOD SED  05/28/2021   IR ANGIO VERTEBRAL SEL VERTEBRAL BILAT MOD SED  05/28/2021   IR IVC FILTER PLMT / S&I /IMG GUID/MOD SED  05/27/2021   IR US GUIDE VASC ACCESS RIGHT  05/27/2021   Past Medical History:  Diagnosis Date   Arthritis    Stroke ([place])    BP 120/76   Pulse 78   SpO2 95%   Opioid Risk Score:  Fall Risk Score:  `1  Depression screen [facility] 2/9     12/09/2022    9:18 AM 11/23/2022    3:39 PM 02/09/2022   11:40 AM 09/08/2021    1:35 PM 08/12/2021   11:18 AM 11/09/2020   11:02 AM 10/26/2020    2:45 PM  Depression screen PHQ 2/9  Decreased Interest 0 0 0 0 1 0 0  Down, Depressed, Hopeless 0 0 0 0 1 0 0  PHQ - 2 Score 0 0 0 0 2 0 0  Altered sleeping 0   0 0    Tired, decreased energy 1   0 1    Change in appetite 0   0 0    Feeling bad or failure about yourself  0   0 0    Trouble concentrating 1   0 1    Moving slowly or fidgety/restless 0   0 0    Suicidal thoughts 0    0    PHQ-9 Score 2   0 4       Review of Systems  Neurological:  Positive for weakness.  All other systems reviewed and are negative.     Objective:   Physical Exam  General: No acute distress HEENT: NCAT, EOMI, oral membranes moist Cards: reg  rate  Chest: normal effort Abdomen: Soft, NT, ND Skin: dry, intact Extremities: no edema Psych: pleasant and appropriate  Skin: intact Neuro: sleepy, does communicate. Limited insight  Speech is dysarthric but clearer.  Patient able to follow simple one-step commands.  better siting posture  Left sternocleidomastoid 1-2 out of 4. , left biceps and brachioradialis, wrist/finger flexors are 1+ out of 4.  Hamstring tightness 2-3/4.  Left upper extremity 0 out of 5 left lower extremity 0-5 in regard to motor function.  Patient remains sensitive to touch in the left upper extremity.  less pain with rom but still tender.  Musculoskeletal:  LUE tender with PROM           Assessment & Plan:    1.  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              -Received custom wheelchair--              -had discussion with Mrs Benda about his dementia. She is doing all the right things by making sure he's sleeping, eating, stimulated by his environment, etc. His mentation has also been negatively impacted by his ICH, seizures, and seizure medications which he has been taking. We discussed aricept but she is not anxious to add any medication to his regimen.    2.  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 Low back pain, left hemiplegic shoulder, ?HO left hip,/neuropathic pain:     4.  Spastic left hemiparesis, cervical dystonia Continue aggressive ROM, HEP,  therapies - neck roll to help support his head . -will repeat  botox with 500u left biceps, finger flexors., left SCM              -continue ROM/splinting -outpt OT vs PT AFTER injections.  5. Seizures:  -lacosamide '100mg'$  bid  -keppra '250mg'$  bid 6. Bowels and bladder: condom cath, incontinence mgt     25 minutes of face to face patient care time were spent during this visit. All questions were encouraged and  answered.   f/u in one month for  botox

## 2023-02-22 NOTE — Telephone Encounter (Signed)
Rx sent 

## 2023-02-22 NOTE — Patient Instructions (Signed)
ALWAYS FEEL FREE TO CALL OUR OFFICE WITH ANY PROBLEMS OR QUESTIONS ([phone])  **PLEASE NOTE** ALL MEDICATION REFILL REQUESTS (INCLUDING CONTROLLED SUBSTANCES) NEED TO BE MADE AT LEAST 7 DAYS PRIOR TO REFILL BEING DUE. ANY REFILL REQUESTS INSIDE THAT TIME FRAME MAY RESULT IN DELAYS IN RECEIVING YOUR PRESCRIPTION.                    

## 2023-02-24 NOTE — Telephone Encounter (Signed)
Paper Rx completed. Ok to fax - placed in fax bin at back nurse station.

## 2023-02-24 NOTE — Telephone Encounter (Signed)
Faxed

## 2023-02-24 NOTE — Telephone Encounter (Signed)
Donald [name] called back and requested we order these instructions for the catheter and fax them to her at [phone]  I am not familiar with ordering a catheter can you please assist

## 2023-03-02 ENCOUNTER — Telehealth: Payer: Self-pay

## 2023-03-02 NOTE — Telephone Encounter (Signed)
Patient insurance denied botox stating he needs to try xeomin or have a reason why it isn't covered, do you want to do xeomin or try to appeal botox ?

## 2023-04-05 ENCOUNTER — Encounter: Payer: Commercial Managed Care - PPO | Admitting: Physical Medicine & Rehabilitation

## 2023-04-12 ENCOUNTER — Encounter: Payer: Self-pay | Admitting: Physical Medicine & Rehabilitation

## 2023-04-12 ENCOUNTER — Encounter
Payer: Commercial Managed Care - PPO | Attending: Physical Medicine & Rehabilitation | Admitting: Physical Medicine & Rehabilitation

## 2023-04-12 VITALS — BP 136/74 | HR 74

## 2023-04-12 DIAGNOSIS — G243 Spasmodic torticollis: Secondary | ICD-10-CM | POA: Diagnosis not present

## 2023-04-12 DIAGNOSIS — I69354 Hemiplegia and hemiparesis following cerebral infarction affecting left non-dominant side: Secondary | ICD-10-CM

## 2023-04-12 MED ORDER — INCOBOTULINUMTOXINA 100 UNITS IM SOLR
500.0000 [IU] | Freq: Once | INTRAMUSCULAR | Status: AC
  Administered 2023-04-12: 500 [IU] via INTRAMUSCULAR

## 2023-04-12 MED ORDER — SODIUM CHLORIDE (PF) 0.9 % IJ SOLN
5.0000 mL | Freq: Once | INTRAMUSCULAR | Status: AC
  Administered 2023-04-12: 5 mL via INTRAVENOUS

## 2023-04-12 NOTE — Patient Instructions (Signed)
ALWAYS FEEL FREE TO CALL OUR OFFICE WITH ANY PROBLEMS OR QUESTIONS ([phone])  **PLEASE NOTE** ALL MEDICATION REFILL REQUESTS (INCLUDING CONTROLLED SUBSTANCES) NEED TO BE MADE AT LEAST 7 DAYS PRIOR TO REFILL BEING DUE. ANY REFILL REQUESTS INSIDE THAT TIME FRAME MAY RESULT IN DELAYS IN RECEIVING YOUR PRESCRIPTION.                    

## 2023-04-12 NOTE — Progress Notes (Signed)
Botox Injection for spasticity of upper extremity using needle EMG guidance Indication: 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 (HCC)  Cervical dystonia G81,14, G24.3  Dilution: 100 Units/ml        Total Units Injected: 500 Indication: Severe spasticity which interferes with ADL,mobility and/or  hygiene and is unresponsive to medication management and other conservative care Informed consent was obtained after describing risks and benefits of the procedure with the patient. This includes bleeding, bruising, infection, excessive weakness, or medication side effects. A REMS form is on file and signed.  Needle: 50mm injectable monopolar needle electrode    Number of units per muscle Sternocleidomastoid: 100 units Levator scapulae 50 units Pectoralis Major  units Pectoralis Minor  units Biceps 150 units Brachioradialis 50 units FCR 25 units FCU 25 units FDS 50 units FDP 50 units FPL 0 units Palmaris Longus 0 units Pronator Teres 0 units Pronator Quadratus 0 units Lumbricals 0 units All injections were done after obtaining appropriate EMG activity and after negative drawback for blood. The patient tolerated the procedure well. Post procedure instructions were given. Return in about 3 months (around 07/13/2023) for spasticity f/u.    Referrals were made for outpt OT, OT at [facility]

## 2023-05-17 ENCOUNTER — Other Ambulatory Visit: Payer: Self-pay | Admitting: Neurology

## 2023-05-17 ENCOUNTER — Other Ambulatory Visit: Payer: Self-pay | Admitting: Anesthesiology

## 2023-05-17 MED ORDER — LACOSAMIDE 100 MG PO TABS: 1.0000 | ORAL_TABLET | Freq: Two times a day (BID) | ORAL | 2 refills | Status: DC

## 2023-05-18 ENCOUNTER — Other Ambulatory Visit (HOSPITAL_BASED_OUTPATIENT_CLINIC_OR_DEPARTMENT_OTHER): Payer: Self-pay

## 2023-05-18 MED ORDER — LACOSAMIDE 100 MG PO TABS
1.0000 | ORAL_TABLET | Freq: Two times a day (BID) | ORAL | 2 refills | Status: DC
  Filled 2023-05-18: qty 180, 90d supply, fill #0

## 2023-05-22 ENCOUNTER — Other Ambulatory Visit (HOSPITAL_BASED_OUTPATIENT_CLINIC_OR_DEPARTMENT_OTHER): Payer: Self-pay

## 2023-05-23 ENCOUNTER — Ambulatory Visit: Payer: Commercial Managed Care - PPO | Admitting: Physical Therapy

## 2023-05-23 ENCOUNTER — Encounter: Payer: Self-pay | Admitting: Occupational Therapy

## 2023-05-23 ENCOUNTER — Ambulatory Visit: Payer: Commercial Managed Care - PPO | Attending: Physical Medicine & Rehabilitation | Admitting: Occupational Therapy

## 2023-05-23 ENCOUNTER — Encounter: Payer: Self-pay | Admitting: Physical Therapy

## 2023-05-23 ENCOUNTER — Telehealth: Payer: Self-pay | Admitting: Neurology

## 2023-05-23 VITALS — BP 138/50 | HR 73

## 2023-05-23 DIAGNOSIS — R293 Abnormal posture: Secondary | ICD-10-CM

## 2023-05-23 DIAGNOSIS — M6281 Muscle weakness (generalized): Secondary | ICD-10-CM | POA: Insufficient documentation

## 2023-05-23 DIAGNOSIS — I69154 Hemiplegia and hemiparesis following nontraumatic intracerebral hemorrhage affecting left non-dominant side: Secondary | ICD-10-CM | POA: Insufficient documentation

## 2023-05-23 DIAGNOSIS — R278 Other lack of coordination: Secondary | ICD-10-CM | POA: Diagnosis present

## 2023-05-23 DIAGNOSIS — R41841 Cognitive communication deficit: Secondary | ICD-10-CM | POA: Insufficient documentation

## 2023-05-23 DIAGNOSIS — M25612 Stiffness of left shoulder, not elsewhere classified: Secondary | ICD-10-CM | POA: Diagnosis present

## 2023-05-23 DIAGNOSIS — M24542 Contracture, left hand: Secondary | ICD-10-CM | POA: Insufficient documentation

## 2023-05-23 DIAGNOSIS — M24522 Contracture, left elbow: Secondary | ICD-10-CM | POA: Insufficient documentation

## 2023-05-23 DIAGNOSIS — I69354 Hemiplegia and hemiparesis following cerebral infarction affecting left non-dominant side: Secondary | ICD-10-CM | POA: Insufficient documentation

## 2023-05-23 DIAGNOSIS — M25632 Stiffness of left wrist, not elsewhere classified: Secondary | ICD-10-CM | POA: Diagnosis present

## 2023-05-23 MED ORDER — LACOSAMIDE 100 MG PO TABS: 1.0000 | ORAL_TABLET | Freq: Two times a day (BID) | ORAL | 2 refills | Status: DC

## 2023-05-23 NOTE — Therapy (Signed)
OUTPATIENT OCCUPATIONAL THERAPY NEURO EVALUATION  Patient Name: Donald [name] MRN: [id number] DOB:March 26, 1956, 67 y.o., male Today's Date: 05/23/2023  PCP: [name], MD  REFERRING PROVIDER: Ranelle Oyster, MD  END OF SESSION:  OT End of Session - 05/23/23 1741     Visit Number 1    Number of Visits 7    Date for OT Re-Evaluation 07/07/23    Authorization Type [employer] Medicare    Progress Note Due on Visit 7    OT Start Time 1618    OT Stop Time 1653    OT Time Calculation (min) 35 min    Activity Tolerance Patient limited by fatigue;Other (comment)   limited by cognition and active participation   Behavior During Therapy WFL for tasks assessed/performed;Flat affect             Past Medical History:  Diagnosis Date   Arthritis    Stroke [facility])    Past Surgical History:  Procedure Laterality Date   APPENDECTOMY     IR ANGIO INTRA EXTRACRAN SEL COM CAROTID INNOMINATE BILAT MOD SED  05/28/2021   IR ANGIO VERTEBRAL SEL VERTEBRAL BILAT MOD SED  05/28/2021   IR IVC FILTER PLMT / S&I /IMG GUID/MOD SED  05/27/2021   IR US GUIDE VASC ACCESS RIGHT  05/27/2021   Patient Active Problem List   Diagnosis Date Noted   Cervical dystonia 04/12/2023   Dementia (HCC) 03/29/2022   H/O: CVA (cerebrovascular accident) 03/29/2022   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) 02/09/2022   At high risk for inadequate nutritional intake 07/12/2021   Transaminitis 06/06/2021   Intraparenchymal hematoma of brain (HCC) 06/04/2021   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HCC) 05/30/2021   Right lower lobe pneumonia 05/30/2021   Prediabetes 05/30/2021   Hyperglycemia 05/30/2021   Intracerebral hemorrhage 05/24/2021   Seizure (HCC) 05/23/2021   Malnutrition of moderate degree 05/22/2021   Presence of other vascular implants and grafts 05/16/2021   Memory deficits    OSA (obstructive sleep apnea)    Generalized OA    Hypokalemia    Leukocytosis    Hemiplga following cerebral infrc  affecting left nondom side (HCC) 05/11/2021   Unspecified dementia, unspecified severity, without behavioral disturbance, psychotic disturbance, mood disturbance, and anxiety (HCC) 12/12/2020   Essential (primary) hypertension 12/13/2019   Personal history of other venous thrombosis and embolism 05/27/2019   Annual physical exam 04/03/2017    ONSET DATE:  04/12/2023 (date of referral) CVA was in March 2022  REFERRING DIAG: W09.811 (ICD-10-CM) - 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   THERAPY DIAG:  Stiffness of left wrist, not elsewhere classified  Stiffness of left shoulder, not elsewhere classified  Muscle weakness (generalized)  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)  Cognitive communication deficit  Rationale for Evaluation and Treatment: Rehabilitation  SUBJECTIVE:   SUBJECTIVE STATEMENT: Pt has been attending [employer] almost daily during the week but his wife is a Runner, broadcasting/film/video and is on Summer break. She states he will go to [employer] maybe 1-2 days a week for socialization but she will mostly be with him throughout the week.   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  Left femoral DVT/Antithrombotics: IVC filter in place, seizures, HTN, dementia  PRECAUTIONS: Other: significant residual L spastic hemiplegia, cognitive impairment, dementia, condom catheter  WEIGHT BEARING RESTRICTIONS: No  PAIN:  Are you having pain? No  FALLS: Has patient fallen in last 6 months? No  Lives with: lives with their spouse and family sometimes helps  Lives in: House/apartment   Has following equipment at home:  Wheelchair (manual), shower chair, bed side commode, and Stedy. 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   OBJECTIVE:   HAND DOMINANCE: Right  ADLs: Overall ADLs: max to total A Transfers/ambulation related to ADLs: Eating: feeds self with right hand UB Dressing: Max A LB Dressing: total A Toileting: total A occasionally will alert wife he needs to have a BM  Bathing: max A - will wash face mostly when given washcloth  Equipment: bed side commode  IADLs: Dependent  MOBILITY STATUS:  wheelchair bound, requires assistance for propelling  POSTURE COMMENTS:  L lean Sitting balance: Poor  ACTIVITY TOLERANCE: Activity tolerance: poor  FUNCTIONAL OUTCOME MEASURES: PSFS: 3  - 05/23/2023 (eval)   Total score = sum of the activity scores/number of activities Minimum detectable change (90%CI) for average score = 2 points Minimum detectable change (90%CI) for single activity score = 3 points  UPPER EXTREMITY ROM:    RUE: WNL LUE: lacks active movement though does appear to guard with PROM  PROM (hard end feel) Right (eval) Left (eval)  Shoulder flexion WNL 80  Shoulder abduction WNL   Elbow flexion WNL   Elbow extension WNL -90  Wrist flexion WNL   Wrist extension WNL -45  Wrist pronation WNL   Wrist supination WNL    Digit Composite Flexion WNL   Digit Composite Extension WNL WFL; hyperextension PIPs  Digit Opposition WNL   (Blank rows = not tested)  UPPER EXTREMITY MMT:     RUE: WFL LUE: unable to test formally; BFL given lack of AROM though pt guards PROM  HAND  FUNCTION: UTA  COORDINATION: UTA  SENSATION: UTA  EDEMA: None reported or observed  MUSCLE TONE: LUE: Severe and Hypertonic  COGNITION: Overall cognitive status: Impaired and not consistently oriented to person, place or time   VISION: Subjective report: used to wear glasses; L VF cut following CVA  PERCEPTION: Impaired: Inattention/neglect: does not attend to left visual field and does not attend to left side of body and Spatial orientation: requires cues  PRAXIS: Impaired: Initiation and Motor planning  OBSERVATIONS: Pt appears fairly well-kept. In manual w/c with poor positioning, only has R footrest, catheter in place. Pt is strapped into w/c with a vest to prevent him from sliding out.     TODAY'S TREATMENT:  N/A this date; however, pt's wife was encouraged to attempt stretching LUE while pt is eating with RUE due to guarding tendencies.   Also discussed use of heat proximal to distal prior to stretching of LUE to improve pt tolerance.   PATIENT EDUCATION: Education details: OT Role and POC; LUE stretching Person educated: Patient and Spouse Education method: Explanation Education comprehension: verbalized understanding and needs further education  HOME EXERCISE PROGRAM: N/a this date  GOALS:  SHORT TERM GOALS: Target date: 06/20/2023    Caregiver will demonstrate independence with LUE ROM HEP. Baseline: no set HEP Goal status: INITIAL   LONG TERM GOALS: Target date: 07/07/2023    Patient will report at least two-point increase in average PSFS score or at least three-point increase in a single activity score indicating functionally significant improvement given minimum detectable change.  Baseline: 3 total score (See above for individual activity scores) Goal status: INITIAL  2.  Pt will participate in ADLs using adaptive strategies  or AD as needed.  Baseline: limited participation Goal status: INITIAL  ASSESSMENT:  CLINICAL IMPRESSION: Patient is a 67 y.o. male who was seen today for occupational therapy evaluation for LUE spasticity management following chronic CVA (2022) with recent round of Botox (04/12/2023).   PERFORMANCE DEFICITS: in functional skills including ADLs, IADLs, coordination, tone, ROM, strength, pain, Fine motor control, Gross motor control, mobility, body mechanics, decreased knowledge of use of DME, vision, UE functional use, and cognition  IMPAIRMENTS: are limiting patient from ADLs, IADLs, work, and leisure.   CO-MORBIDITIES: has co-morbidities such as dementia/impaired cognitive status  that affects occupational performance. Patient will benefit from skilled OT to address above impairments and improve overall function.  MODIFICATION OR ASSISTANCE TO COMPLETE EVALUATION: Min-Moderate modification of tasks or assist with assess necessary to complete an evaluation.  OT OCCUPATIONAL PROFILE AND HISTORY: Problem focused assessment: Including review of records relating to presenting problem.  CLINICAL DECISION MAKING: Moderate - several treatment options, min-mod task modification necessary  REHAB POTENTIAL: Fair given chronicity of stroke and pt's cognition  EVALUATION COMPLEXITY: Moderate    PLAN:  OT FREQUENCY: 1x/week  OT DURATION: 6 weeks  PLANNED INTERVENTIONS: self care/ADL training, therapeutic exercise, therapeutic activity, manual therapy, passive range of motion, moist heat, patient/family education, cognitive remediation/compensation, visual/perceptual remediation/compensation, DME and/or AE instructions, and Re-evaluation  RECOMMENDED OTHER SERVICES: none at this time  CONSULTED AND AGREED WITH PLAN OF CARE:  pt's spouse  PLAN FOR NEXT SESSION: LUE PROM HEP; ADLs   Delana Meyer, OT 1/61/0960, 5:46 PM

## 2023-05-23 NOTE — Addendum Note (Signed)
Addended by: Berna Spare A on: 05/23/2023 02:53 PM   Modules accepted: Orders

## 2023-05-23 NOTE — Telephone Encounter (Signed)
Meds ordered this encounter  Medications   DISCONTD: Lacosamide 100 MG TABS    Sig: Take 1 tablet (100 mg total) by mouth 2 (two) times daily.    Dispense:  180 tablet    Refill:  2    Not to exceed 5 additional fills before 08/12/2023   Lacosamide 100 MG TABS    Sig: Take 1 tablet (100 mg total) by mouth 2 (two) times daily.    Dispense:  180 tablet    Refill:  2    Not to exceed 5 additional fills before 08/12/2023    Suanne Marker, MD 05/23/2023, 3:14 PM Certified in Neurology, Neurophysiology and Neuroimaging  [facility] [address], [address] [place], [place] [address] [phone]

## 2023-05-23 NOTE — Addendum Note (Signed)
Addended by: Joycelyn Schmid R on: 05/23/2023 03:14 PM   Modules accepted: Orders

## 2023-05-23 NOTE — Therapy (Signed)
OUTPATIENT PHYSICAL THERAPY NEURO EVALUATION   Patient Name: Donald [name] MRN: [id number] DOB:08-27-1956, 67 y.o., male Today's Date: 05/23/2023   PCP: [name], MD   REFERRING PROVIDER: Ranelle Oyster, MD    END OF SESSION:  PT End of Session - 05/23/23 1626     Visit Number 1    Number of Visits 7    Date for PT Re-Evaluation 07/22/23    Authorization Type UHC    PT Start Time 1540   pt late to eval   PT Stop Time 1615    PT Time Calculation (min) 35 min    Equipment Utilized During Treatment Gait belt    Activity Tolerance Patient tolerated treatment well    Behavior During Therapy WFL for tasks assessed/performed;Flat affect             Past Medical History:  Diagnosis Date   Arthritis    Stroke [facility])    Past Surgical History:  Procedure Laterality Date   APPENDECTOMY     IR ANGIO INTRA EXTRACRAN SEL COM CAROTID INNOMINATE BILAT MOD SED  05/28/2021   IR ANGIO VERTEBRAL SEL VERTEBRAL BILAT MOD SED  05/28/2021   IR IVC FILTER PLMT / S&I /IMG GUID/MOD SED  05/27/2021   IR US GUIDE VASC ACCESS RIGHT  05/27/2021   Patient Active Problem List   Diagnosis Date Noted   Cervical dystonia 04/12/2023   Dementia (HCC) 03/29/2022   H/O: CVA (cerebrovascular accident) 03/29/2022   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) 02/09/2022   At high risk for inadequate nutritional intake 07/12/2021   Transaminitis 06/06/2021   Intraparenchymal hematoma of brain (HCC) 06/04/2021   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HCC) 05/30/2021   Right lower lobe pneumonia 05/30/2021   Prediabetes 05/30/2021   Hyperglycemia 05/30/2021   Intracerebral hemorrhage 05/24/2021   Seizure (HCC) 05/23/2021   Malnutrition of moderate degree 05/22/2021   Presence of other vascular implants and grafts 05/16/2021   Memory deficits    OSA (obstructive sleep apnea)    Generalized OA    Hypokalemia    Leukocytosis    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 (HCC)  05/11/2021   Unspecified dementia, unspecified severity, without behavioral disturbance, psychotic disturbance, mood disturbance, and anxiety (HCC) 12/12/2020   Essential (primary) hypertension 12/13/2019   Personal history of other venous thrombosis and embolism 05/27/2019   Annual physical exam 04/03/2017    ONSET DATE: 04/12/2023 (date of referral)  REFERRING DIAG: I69.354 (ICD-10-CM) - 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 (HCC)   THERAPY DIAG:  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)  Muscle weakness (generalized)  Abnormal posture  Rationale for Evaluation and Treatment: Rehabilitation  SUBJECTIVE:  SUBJECTIVE STATEMENT: Pt's wife wants him to get more weight through his L side and work on his L side. Has been getting botox to his L arm and that has been getting more loose. Pt's wife reports they are transferring at home with the [facility]. Pt arrives in manual w/c with incr trunk lean to the L. Has a different wheelchair that reclines and has a higher back - got it from [employer], took about 1 year to get it. Goes to adult daycare and part of the wheelchair was not doing what it was supposed to do. Has to call [employer] to work on getting it fixed. Has a strap/vest on that keeps pt in his manual chair and prevent him from sliding out. Pt's wife is the primary caregiver - in charge of bathing, dressing, transfers. Wife also helps with bed mobility. No falls.   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  Left femoral DVT/Antithrombotics:  IVC filter in place, seizures, HTN, dementia   PAIN:  Are you having pain? No  Vitals:   05/23/23 1555  BP: (!) 138/50  Pulse: 73     PRECAUTIONS: Other: significant residual L spastic hemiplegia, cognitive impairment, dementia, condom catheter    WEIGHT BEARING RESTRICTIONS: No  FALLS: Has patient fallen in last 6 months? No  LIVING ENVIRONMENT: Lives with: lives with their spouse and family sometimes helps  Lives in: House/apartment  Has following equipment at home: Wheelchair (manual), shower chair, bed side commode, and Stedy. Pt's wife bathes pt in [place] when it is just her, otherwise when family is over, they bring pt to the shower chair   PLOF: Needs assistance with ADLs and Needs assistance with transfers  PATIENT GOALS: Per wife - wants to work on sitting balance, try to weight bear through L side. Work on moving his hand   OBJECTIVE:   COGNITION: Overall cognitive status: Impaired   SENSATION: Unable to assess due to cognitive deficits, pt not able to follow directions   MUSCLE TONE: LLE: Severe and Hypertonic  POSTURE: rounded shoulders, forward head, increased thoracic kyphosis, posterior pelvic tilt, flexed trunk , weight shift left, and significant trunk shortening on L side, head held in L lateral cervical flexion   Pt currently in standard manual w/c, has other personalized w/c with recline and higher back rest is broken. Pt only with R foot rest and has LLE on R foot rest.   Pt is strapped into w/c with a vest to prevent him from sliding out.    LOWER EXTREMITY ROM:    Significant hypomobility to L hamstring, L ankle DF, tested passively in sitting    LOWER EXTREMITY MMT:   Unable to assess due to cognitive deficits, pt not able to follow directions for MMT testing   Pt needing PT assist to pick up LLE during entirety of session.   BED MOBILITY:  Did not get to assess during eval due to time constraints.   TRANSFERS: Assistive device  utilized:  [employer] to stand: Max A Stand to sit: Mod A Pt with significant tightness/rigidity in trunk, unable to actively lean forwards, needs max assist for forward lean and cues for RUE placement on Stedy.  Once in standing pt with significant L>R knee flexion, forward posture with incr trunk flexion, and decr L ankle ROM with L heel unable to  be placed on the [place] platform.  In [place], attempted to cue to look upright for posture with visual cue on mirror, pt unable to follow cues for posture. Attempted to provide additional cue at buttocks for hip extension.   When sitting back down in manual w/c, pt needing mod A for controlled descent and max cues for hip flexion (with pt unable to lean forwards).   Will have to further assess sitting balance, ran out of time during eval.   GAIT: Pt is non-ambulatory.   TODAY'S TREATMENT:       N/A during eval.                                                                                                                          PATIENT EDUCATION: Education details: Clinical findings, POC, reaching out to [employer] to fix w/c as pt currently in manual wheelchair with poor positioning. What PT will work on - stretching/ROM program, working on sitting balance to help with ADLs, standing tolerance in [place].  Person educated: Patient and Spouse Education method: Explanation Education comprehension: verbalized understanding  HOME EXERCISE PROGRAM: Will provide at next session for ROM/Stretching   GOALS: Goals reviewed with patient? Yes  SHORT TERM GOALS: Target date: 06/13/2023  Pt will perform initial HEP with family assist for improved ROM, posture, strength.  Baseline: Goal status: INITIAL  2.  Sitting balance to be assessed with STG/LTG written.  Baseline:  Goal status: INITIAL  3.  Pt will perform at least 3 minutes of standing in [place] with min A for improved tolerance/participation in ADLs.  Baseline: Not officially timed during  eval  Goal status: INITIAL    LONG TERM GOALS: Target date: 07/04/2023  Pt will perform final HEP with family assist for improved ROM, posture, strength.  Baseline:  Goal status: INITIAL  2.  Sitting balance goal to be assessed with goal written.  Baseline: Not yet assessed.  Goal status: INITIAL  3.  Pt will perform sit > stand from w/c to [facility] with mod A in order to decr caregiver burden.  Baseline: max A  Goal status: INITIAL  4.  Pt will perform at least 6 minutes of standing in [place] with min A for improved tolerance/participation in ADLs.  Baseline:  Goal status: INITIAL   ASSESSMENT:  CLINICAL IMPRESSION: Patient is a 67 year old male referred to Neuro OPPT for L spastic hemiplegia s/p ICH R frontal lobe from 05/2021. Pt is well known to this clinic from therapies in 2022.   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  Left femoral DVT/Antithrombotics: IVC filter in place, seizures, HTN, dementia. Pt's spouse is his primary caregiver, who requires max/total A and uses the Stedy for transfers at home and to help with ADLs. Pt's personalized manual w/c is currently broken and needs to undergo a repair through [employer]. Pt currently in manual  w/c with poor positioning. The following deficits were present during the exam: abnormal tone, abnormal posture, decr strength, decr flexibility, decr standing tolerance, impaired balance, cognitive impairments, impaired sensation, decr ROM, difficulty with transfers. Pt would benefit from skilled PT to address these impairments and to work on sitting tolerance for improved ADLs, HEP for stretching/ROM program, and working on standing tolerance in [place].    OBJECTIVE IMPAIRMENTS: decreased activity tolerance, decreased balance, decreased cognition, decreased coordination, decreased knowledge of condition,  decreased mobility, decreased ROM, decreased strength, hypomobility, impaired flexibility, impaired sensation, impaired tone, impaired UE functional use, impaired vision/preception, and postural dysfunction.   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  Left femoral DVT/Antithrombotics: IVC filter in place, seizures, HTN, dementia   are also affecting patient's functional outcome.   REHAB POTENTIAL: Fair due to time since onset, significant residual spastic hemiplegia and cognitive impairment from CVA in 2022, dementia   CLINICAL DECISION MAKING: Evolving/moderate complexity  EVALUATION COMPLEXITY: Moderate  PLAN:  PT FREQUENCY: 1x/week  PT DURATION: 8 weeks - only anticipate 6 weeks, due to delay in scheduling   PLANNED INTERVENTIONS: Therapeutic exercises, Therapeutic activity, Neuromuscular re-education, Balance training, Gait training, Patient/Family education, Self Care, Joint mobilization, Manual therapy, and Re-evaluation  PLAN FOR NEXT SESSION: Transfer with stedy to mat table, look at sitting balance and write goal. HEP for ROM/stretching for pt's wife and other help at home to perform   Drake Leach, PT, DPT  05/23/2023, 4:26 PM

## 2023-05-23 NOTE — Telephone Encounter (Signed)
Rx originally filled on 05/18/2023. Pt needs to change pharmacies. Please fill.

## 2023-05-23 NOTE — Telephone Encounter (Signed)
Pt's wife called stating that they are needing the pt's Lacosamide 100 MG TABS [facility] [address] [facility] on due to the [facility] is no longer covered by his insurance.

## 2023-06-01 ENCOUNTER — Encounter: Payer: Self-pay | Admitting: Physical Therapy

## 2023-06-01 ENCOUNTER — Ambulatory Visit: Payer: Commercial Managed Care - PPO | Admitting: Physical Therapy

## 2023-06-01 ENCOUNTER — Ambulatory Visit: Payer: Commercial Managed Care - PPO | Admitting: Occupational Therapy

## 2023-06-01 DIAGNOSIS — I69154 Hemiplegia and hemiparesis following nontraumatic intracerebral hemorrhage affecting left non-dominant side: Secondary | ICD-10-CM

## 2023-06-01 DIAGNOSIS — M25612 Stiffness of left shoulder, not elsewhere classified: Secondary | ICD-10-CM

## 2023-06-01 DIAGNOSIS — R41841 Cognitive communication deficit: Secondary | ICD-10-CM

## 2023-06-01 DIAGNOSIS — M6281 Muscle weakness (generalized): Secondary | ICD-10-CM

## 2023-06-01 DIAGNOSIS — R278 Other lack of coordination: Secondary | ICD-10-CM

## 2023-06-01 DIAGNOSIS — R293 Abnormal posture: Secondary | ICD-10-CM

## 2023-06-01 DIAGNOSIS — M25632 Stiffness of left wrist, not elsewhere classified: Secondary | ICD-10-CM

## 2023-06-01 NOTE — Therapy (Signed)
OUTPATIENT OCCUPATIONAL THERAPY NEURO TREATMENT  Patient Name: Donald [name] MRN: [id number] DOB:1956-10-27, 67 y.o., male Today's Date: 06/01/2023  PCP: [name], MD  REFERRING PROVIDER: Ranelle Oyster, MD  END OF SESSION: visit #2  OT End of Session - 06/01/23 1237     Number of Visits 7    Date for OT Re-Evaluation 07/07/23    Authorization Type [employer]    Progress Note Due on Visit 7    OT Start Time 1235    OT Stop Time 1317    OT Time Calculation (min) 42 min    Activity Tolerance Patient limited by fatigue;Other (comment)   limited by cognition and active participation   Behavior During Therapy WFL for tasks assessed/performed;Flat affect             Past Medical History:  Diagnosis Date   Arthritis    Stroke [facility])    Past Surgical History:  Procedure Laterality Date   APPENDECTOMY     IR ANGIO INTRA EXTRACRAN SEL COM CAROTID INNOMINATE BILAT MOD SED  05/28/2021   IR ANGIO VERTEBRAL SEL VERTEBRAL BILAT MOD SED  05/28/2021   IR IVC FILTER PLMT / S&I /IMG GUID/MOD SED  05/27/2021   IR US GUIDE VASC ACCESS RIGHT  05/27/2021   Patient Active Problem List   Diagnosis Date Noted   Cervical dystonia 04/12/2023   Dementia (HCC) 03/29/2022   H/O: CVA (cerebrovascular accident) 03/29/2022   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) 02/09/2022   At high risk for inadequate nutritional intake 07/12/2021   Transaminitis 06/06/2021   Intraparenchymal hematoma of brain (HCC) 06/04/2021   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HCC) 05/30/2021   Right lower lobe pneumonia 05/30/2021   Prediabetes 05/30/2021   Hyperglycemia 05/30/2021   Intracerebral hemorrhage 05/24/2021   Seizure (HCC) 05/23/2021   Malnutrition of moderate degree 05/22/2021   Presence of other vascular implants and grafts 05/16/2021   Memory deficits    OSA (obstructive sleep apnea)    Generalized OA    Hypokalemia    Leukocytosis    Hemiplga following cerebral infrc affecting left  nondom side (HCC) 05/11/2021   Unspecified dementia, unspecified severity, without behavioral disturbance, psychotic disturbance, mood disturbance, and anxiety (HCC) 12/12/2020   Essential (primary) hypertension 12/13/2019   Personal history of other venous thrombosis and embolism 05/27/2019   Annual physical exam 04/03/2017    ONSET DATE:  04/12/2023 (date of referral) CVA was in March 2022  REFERRING DIAG: W09.811 (ICD-10-CM) - 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   THERAPY DIAG:  Muscle weakness (generalized)  Stiffness of left wrist, not elsewhere classified  Stiffness of left shoulder, not elsewhere classified  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)  Other lack of coordination  Cognitive communication deficit  Rationale for Evaluation and Treatment: Rehabilitation  SUBJECTIVE:   SUBJECTIVE STATEMENT: Patient's wife reports she has difficulty keeping his L hand away from his chest where he likes to tuck it.  She has a few resting hand splints for the left hand but has difficulty donning them.  At night, she applies "dementia glove" to R hand.  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  Left femoral DVT/Antithrombotics: IVC filter in place, seizures, HTN, dementia  PRECAUTIONS: Other: significant residual L spastic hemiplegia, cognitive impairment, dementia, condom catheter  WEIGHT BEARING RESTRICTIONS: No  PAIN:  Are you having pain? No  FALLS: Has patient fallen in last 6 months? No  Lives with: lives with their spouse and family sometimes helps  Lives in: House/apartment   Has following equipment at  home: Wheelchair (manual), shower chair, bed side commode, and Stedy. 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   OBJECTIVE:   HAND DOMINANCE: Right  ADLs: Overall ADLs: max to total A Transfers/ambulation related to ADLs: Eating: feeds self with right hand UB Dressing: Max A LB Dressing: total A Toileting: total A occasionally will alert wife he needs to have a BM  Bathing: max A - will wash face mostly when given washcloth  Equipment: bed side commode  IADLs: Dependent  MOBILITY STATUS:  wheelchair bound, requires assistance for propelling  POSTURE COMMENTS:  L lean Sitting balance: Poor  ACTIVITY TOLERANCE: Activity tolerance: poor  FUNCTIONAL OUTCOME MEASURES: PSFS: 3  - 05/23/2023 (eval)   Total score = sum of the activity scores/number of activities Minimum detectable change (90%CI) for average score = 2 points Minimum detectable change (90%CI) for single activity score = 3 points  UPPER EXTREMITY ROM:    RUE: WNL LUE: lacks active movement though does appear to guard with PROM  PROM (hard end feel) Right (eval) Left (eval)  Shoulder flexion WNL 80  Shoulder abduction WNL   Elbow flexion WNL   Elbow extension WNL -90  Wrist flexion WNL   Wrist extension WNL -45  Wrist pronation WNL   Wrist supination WNL    Digit Composite Flexion WNL   Digit Composite Extension WNL WFL; hyperextension PIPs  Digit Opposition WNL   (Blank rows = not tested)  UPPER EXTREMITY MMT:     RUE: WFL LUE: unable to test formally; BFL given lack of AROM though pt guards PROM  HAND  FUNCTION: UTA  COORDINATION: UTA  SENSATION: UTA  EDEMA: None reported or observed  MUSCLE TONE: LUE: Severe and Hypertonic  COGNITION: Overall cognitive status: Impaired and not consistently oriented to person, place or time   VISION: Subjective report: used to wear glasses; L VF cut following CVA  PERCEPTION: Impaired: Inattention/neglect: does not attend to left visual field and does not attend to left side of body and Spatial orientation: requires cues  PRAXIS: Impaired: Initiation and Motor planning  OBSERVATIONS: Pt appears fairly well-kept. In manual w/c with poor positioning, only has R footrest, catheter in place. Pt is strapped into w/c with a vest to prevent him from sliding out.     TODAY'S TREATMENT:                                                                                                                               -  Self-care/home management completed for duration as noted below including: OT educated patient's spouse on donning of resting hand splint with modifications made to improve positioning of L wrist and digits.  Therapist instructed on how to clean outer shell and encouraged spouse to bring in additional splints during the patient's next scheduled appointment. Using patient's padded half wheelchair tray and prolonged stretch of LUE while talking to patient and spouse, therapist was able to achieve 120 degrees L elbow extension.  To further promote functional positioning of this extremity, OT placed padded Velcro strap around LUE using half try to maintain positioning.  PATIENT EDUCATION: Education details: OT Role and POC; LUE stretching Person educated: Patient and Spouse Education method: Explanation Education comprehension: verbalized understanding and needs further education  HOME EXERCISE PROGRAM: N/a this date  GOALS:  SHORT TERM GOALS: Target date: 06/20/2023    Caregiver will demonstrate independence with LUE ROM HEP. Baseline: no  set HEP Goal status: INITIAL   LONG TERM GOALS: Target date: 07/07/2023    Patient will report at least two-point increase in average PSFS score or at least three-point increase in a single activity score indicating functionally significant improvement given minimum detectable change.  Baseline: 3 total score (See above for individual activity scores) Goal status: INITIAL  2.  Pt will participate in ADLs using adaptive strategies or AD as needed.  Baseline: limited participation Goal status: INITIAL  ASSESSMENT:  CLINICAL IMPRESSION: Patient demonstrating good tolerance with completion of LUE stretching and donning of resting hand splint, which required adjustments for proper positioning and to promote stretch of wrist and digits to prevent further contracture.  Patient to benefit from additional modifications to promote less guarded positioning of LUE throughout the day as needed to increase comfort with participation in ADLs.  PERFORMANCE DEFICITS: in functional skills including ADLs, IADLs, coordination, tone, ROM, strength, pain, Fine motor control, Gross motor control, mobility, body mechanics, decreased knowledge of use of DME, vision, UE functional use, and cognition  IMPAIRMENTS: are limiting patient from ADLs, IADLs, work, and leisure.   CO-MORBIDITIES: has co-morbidities such as dementia/impaired cognitive status  that affects occupational performance. Patient will benefit from skilled OT to address above impairments and improve overall function.  REHAB POTENTIAL: Fair given chronicity of stroke and pt's cognition  PLAN:  OT FREQUENCY: 1x/week  OT DURATION: 6 weeks  PLANNED INTERVENTIONS: self care/ADL training, therapeutic exercise, therapeutic activity, manual therapy, passive range of motion, moist heat, patient/family education, cognitive remediation/compensation, visual/perceptual remediation/compensation, DME and/or AE instructions, and Re-evaluation  RECOMMENDED  OTHER SERVICES: none at this time  CONSULTED AND AGREED WITH PLAN OF CARE:  pt's spouse  PLAN FOR NEXT SESSION: LUE braces, strap for LUE positioning, LUE PROM HEP; ADLs   Delana Meyer, OT 02/09/6009, 5:08 PM

## 2023-06-01 NOTE — Therapy (Signed)
OUTPATIENT PHYSICAL THERAPY NEURO TREATMENT   Patient Name: Donald [name] MRN: [id number] DOB:09-Oct-1956, 67 y.o., male Today's Date: 06/01/2023   PCP: [name], MD REFERRING PROVIDER: Ranelle Oyster, MD  END OF SESSION:  06/01/23 1144  PT Visits / Re-Eval  Visit Number 2  Number of Visits 7  Date for PT Re-Evaluation 07/22/23  Authorization  Authorization Type [employer]  PT Time Calculation  PT Start Time 1145  PT Stop Time 1228  PT Time Calculation (min) 43 min  PT - End of Session  Equipment Utilized During Treatment Gait belt  Activity Tolerance Patient tolerated treatment well  Behavior During Therapy WFL for tasks assessed/performed;Flat affect    Past Medical History:  Diagnosis Date   Arthritis    Stroke [facility])    Past Surgical History:  Procedure Laterality Date   APPENDECTOMY     IR ANGIO INTRA EXTRACRAN SEL COM CAROTID INNOMINATE BILAT MOD SED  05/28/2021   IR ANGIO VERTEBRAL SEL VERTEBRAL BILAT MOD SED  05/28/2021   IR IVC FILTER PLMT / S&I /IMG GUID/MOD SED  05/27/2021   IR US GUIDE VASC ACCESS RIGHT  05/27/2021   Patient Active Problem List   Diagnosis Date Noted   Cervical dystonia 04/12/2023   Dementia (HCC) 03/29/2022   H/O: CVA (cerebrovascular accident) 03/29/2022   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) 02/09/2022   At high risk for inadequate nutritional intake 07/12/2021   Transaminitis 06/06/2021   Intraparenchymal hematoma of brain (HCC) 06/04/2021   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HCC) 05/30/2021   Right lower lobe pneumonia 05/30/2021   Prediabetes 05/30/2021   Hyperglycemia 05/30/2021   Intracerebral hemorrhage 05/24/2021   Seizure (HCC) 05/23/2021   Malnutrition of moderate degree 05/22/2021   Presence of other vascular implants and grafts 05/16/2021   Memory deficits    OSA (obstructive sleep apnea)    Generalized OA    Hypokalemia    Leukocytosis    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 (HCC)  05/11/2021   Unspecified dementia, unspecified severity, without behavioral disturbance, psychotic disturbance, mood disturbance, and anxiety (HCC) 12/12/2020   Essential (primary) hypertension 12/13/2019   Personal history of other venous thrombosis and embolism 05/27/2019   Annual physical exam 04/03/2017    ONSET DATE: 04/12/2023 (date of referral)  REFERRING DIAG: I69.354 (ICD-10-CM) - 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 (HCC)   THERAPY DIAG:  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)  Muscle weakness (generalized)  Abnormal posture  Rationale for Evaluation and Treatment: Rehabilitation  SUBJECTIVE:  SUBJECTIVE STATEMENT: Pt's wife brings pt in tilt-in-space manual wheelchair, tilt feature was broken at daycare.  Wheelchair is stuck in tilted position.  [employer] is ordering the part to fix this.  He denies pain.  Spouse denies falls.  Pt is tangential and soft spoken.  Recommended wheelchair brake be assessed by [employer] due to being loose on right side.  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  Left femoral DVT/Antithrombotics: IVC filter in place, seizures, HTN, dementia   PAIN:  Are you having pain? No  There were no vitals filed for this visit.    PRECAUTIONS: Other: significant residual L spastic hemiplegia, cognitive impairment, dementia, condom catheter    WEIGHT BEARING RESTRICTIONS: No  FALLS: Has patient fallen in last 6 months? No  LIVING ENVIRONMENT: Lives with: lives with their spouse and family  sometimes helps  Lives in: House/apartment  Has following equipment at home: Wheelchair (manual), shower chair, bed side commode, and Stedy. Pt's wife bathes pt in [place] when it is just her, otherwise when family is over, they bring pt to the shower chair   PLOF: Needs assistance with ADLs and Needs assistance with transfers  PATIENT GOALS: Per wife - wants to work on sitting balance, try to weight bear through L side. Work on moving his hand   OBJECTIVE:   COGNITION: Overall cognitive status: Impaired   SENSATION: Unable to assess due to cognitive deficits, pt not able to follow directions   MUSCLE TONE: LLE: Severe and Hypertonic  POSTURE: rounded shoulders, forward head, increased thoracic kyphosis, posterior pelvic tilt, flexed trunk , weight shift left, and significant trunk shortening on L side, head held in L lateral cervical flexion   Pt currently in standard manual w/c, has other personalized w/c with recline and higher back rest is broken. Pt only with R foot rest and has LLE on R foot rest.   Pt is strapped into w/c with a vest to prevent him from sliding out.    LOWER EXTREMITY ROM:    Significant hypomobility to L hamstring, L ankle DF, tested passively in sitting    LOWER EXTREMITY MMT:   Unable to assess due to cognitive deficits, pt not able to follow directions for MMT testing   Pt needing PT assist to pick up LLE during entirety of session.   BED MOBILITY:  Did not get to assess during eval due to time constraints.   TRANSFERS: Assistive device utilized:  [employer] to stand: Max A Stand to sit: Mod A Pt with significant tightness/rigidity in trunk, unable to actively lean forwards, needs max assist for forward lean and cues for RUE placement on Stedy.  Once in standing pt with significant L>R knee flexion, forward posture with incr trunk flexion, and decr L ankle ROM with L heel unable to be placed on the [employer].  In [place], attempted to  cue to look upright for posture with visual cue on mirror, pt unable to follow cues for posture. Attempted to provide additional cue at buttocks for hip extension.   When sitting back down in manual w/c, pt needing mod A for controlled descent and max cues for hip flexion (with pt unable to lean forwards).   Will have to further assess sitting balance, ran out of time during eval.   GAIT: Pt is non-ambulatory.   TODAY'S  TREATMENT:       Pt transferred to sitting EOM table using Stedy and RUE to pull into standing from tilted W/C as he does at home per wife.  He takes increased time to engage to task, but demonstrates great upper body strength and reliance to maintain trunk stability in stander.  Seated on EOM he relies on RUE to stay upright.  Without arm support he can sit for 1 minute and 27 seconds before developing severe posterior lean.  He requires increased cuing for sustained attention to task and to prevent compensation during assessment using hand over hand to prevent grabbing the mat.  In unsupported sitting PT has pt practice reaching up, laterally, forward, cross body to left knee.  Caregiver was also instructed to practice leaning patient to left for sustained holds and propping heels (floating off edge of pillow to prevent pressure injuries) in chair for hamstring stretch.  Attempted LAQ and  marching on RLE with pt not engaging quite as cued so discussed potential external cues for use next session to strengthen LE including soccer.  PT facilitated manual stretch of the right pec x3 varying seconds.  Discussed with wife having pt relax head over head rest and  using pillow behind back in stuck chair to prevent worsened forward head posture until chair is fixed as they have no other wheelchair option at home per report.      Stedy transfer performed same as onset to return to wheelchair for handoff to OT.                                                                                                              PATIENT EDUCATION: Education details: Right brake loose-follow-up with [employer].  HEP and other education as above. Person educated: Patient and Spouse Education method: Explanation Education comprehension: verbalized understanding  HOME EXERCISE PROGRAM: -RUE reaching up, laterally, forward, cross body to left knee -Sustained left holds/weight shifts -Passive hamstring stretch propping LE in chair with heels floated -Can attempt LAQ and marching of the RLE -Manual stretch of the right pec for posture -Positioning in chair to prevent worsening posture  GOALS: Goals reviewed with patient? Yes  SHORT TERM GOALS: Target date: 06/13/2023  Pt will perform initial HEP with family assist for improved ROM, posture, strength.  Baseline: Goal status: INITIAL  2.  Sitting balance to be assessed with STG/LTG written.  Baseline:  Goal status: INITIAL  3.  Pt will perform at least 3 minutes of standing in [place] with min A for improved tolerance/participation in ADLs.  Baseline: Not officially timed during eval  Goal status: INITIAL    LONG TERM GOALS: Target date: 07/04/2023  Pt will perform final HEP with family assist for improved ROM, posture, strength.  Baseline:  Goal status: INITIAL  2.  Pt will remain in upright unsupported sitting x2 minutes to improve participation in ADLs and decrease caregiver burden.    Baseline: 1 minute and 27 seconds (6/20) Goal status: INITIAL  3.  Pt will perform sit > stand  from w/c to [facility] with mod A in order to decr caregiver burden.  Baseline: max A  Goal status: INITIAL  4.  Pt will perform at least 6 minutes of standing in [place] with min A for improved tolerance/participation in ADLs.  Baseline:  Goal status: INITIAL   ASSESSMENT:  CLINICAL IMPRESSION: Assessed unsupported sitting balance today with patient demonstrating good core engagement that fatigues out quickly.  He develops severe posterior lean without RUE  reliance in under 2 minutes.  Established introductory HEP that somewhat overlaps with caregivers current attempts to stretch and weight shift patient with goal of increasing patient participation.  He is severely limited by cognitive impairment including sustained attention to task.  PT to continue building on HEP established today and using external cues to better engage patient to task.  OBJECTIVE IMPAIRMENTS: decreased activity tolerance, decreased balance, decreased cognition, decreased coordination, decreased knowledge of condition, decreased mobility, decreased ROM, decreased strength, hypomobility, impaired flexibility, impaired sensation, impaired tone, impaired UE functional use, impaired vision/preception, and postural dysfunction.   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  Left femoral DVT/Antithrombotics: IVC filter in place, seizures, HTN, dementia   are also affecting patient's functional outcome.   REHAB POTENTIAL: Fair due to time since onset, significant residual spastic hemiplegia and cognitive impairment from CVA in 2022, dementia   CLINICAL DECISION MAKING: Evolving/moderate complexity  EVALUATION COMPLEXITY: Moderate  PLAN:  PT FREQUENCY: 1x/week  PT DURATION: 8 weeks - only anticipate 6 weeks, due to delay in scheduling   PLANNED INTERVENTIONS: Therapeutic exercises, Therapeutic activity, Neuromuscular  re-education, Balance training, Gait training, Patient/Family education, Self Care, Joint mobilization, Manual therapy, and Re-evaluation  PLAN FOR NEXT SESSION: Transfer with stedy to mat table.  Add to HEP as needed for ROM/stretching for pt's wife and other help at home to perform.  Try seated LE engagement w/ soccer ball kicks and 2" step target.  If reaching task patient responds well to high-fives.  May do well with blaze pods.  Sadie Haber, PT, DPT  06/01/2023, 11:45 AM

## 2023-06-07 ENCOUNTER — Encounter: Payer: Self-pay | Admitting: Physical Therapy

## 2023-06-07 ENCOUNTER — Ambulatory Visit: Payer: Commercial Managed Care - PPO | Admitting: Physical Therapy

## 2023-06-07 ENCOUNTER — Ambulatory Visit: Payer: Commercial Managed Care - PPO | Admitting: Occupational Therapy

## 2023-06-07 DIAGNOSIS — M24542 Contracture, left hand: Secondary | ICD-10-CM

## 2023-06-07 DIAGNOSIS — R278 Other lack of coordination: Secondary | ICD-10-CM

## 2023-06-07 DIAGNOSIS — R293 Abnormal posture: Secondary | ICD-10-CM

## 2023-06-07 DIAGNOSIS — M24522 Contracture, left elbow: Secondary | ICD-10-CM

## 2023-06-07 DIAGNOSIS — M25632 Stiffness of left wrist, not elsewhere classified: Secondary | ICD-10-CM

## 2023-06-07 DIAGNOSIS — I69154 Hemiplegia and hemiparesis following nontraumatic intracerebral hemorrhage affecting left non-dominant side: Secondary | ICD-10-CM

## 2023-06-07 DIAGNOSIS — M6281 Muscle weakness (generalized): Secondary | ICD-10-CM

## 2023-06-07 NOTE — Therapy (Signed)
OUTPATIENT OCCUPATIONAL THERAPY NEURO TREATMENT  Patient Name: Donald [name] MRN: [id number] DOB:11-May-1956, 67 y.o., male Today's Date: 06/07/2023  PCP: [name], MD  REFERRING PROVIDER: Ranelle Oyster, MD  END OF SESSION:   OT End of Session - 06/07/23 1031     Visit Number 3    Number of Visits 7    Date for OT Re-Evaluation 07/07/23    Authorization Type [employer] Medicare    Progress Note Due on Visit 7    OT Start Time 1100    OT Stop Time 1145    OT Time Calculation (min) 45 min    Activity Tolerance Patient limited by fatigue;Other (comment)   limited by cognition and active participation   Behavior During Therapy [facility] for tasks assessed/performed;Flat affect             Past Medical History:  Diagnosis Date   Arthritis    Stroke [facility])    Past Surgical History:  Procedure Laterality Date   APPENDECTOMY     IR ANGIO INTRA EXTRACRAN SEL COM CAROTID INNOMINATE BILAT MOD SED  05/28/2021   IR ANGIO VERTEBRAL SEL VERTEBRAL BILAT MOD SED  05/28/2021   IR IVC FILTER PLMT / S&I /IMG GUID/MOD SED  05/27/2021   IR US GUIDE VASC ACCESS RIGHT  05/27/2021   Patient Active Problem List   Diagnosis Date Noted   Cervical dystonia 04/12/2023   Dementia (HCC) 03/29/2022   H/O: CVA (cerebrovascular accident) 03/29/2022   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) 02/09/2022   At high risk for inadequate nutritional intake 07/12/2021   Transaminitis 06/06/2021   Intraparenchymal hematoma of brain (HCC) 06/04/2021   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HCC) 05/30/2021   Right lower lobe pneumonia 05/30/2021   Prediabetes 05/30/2021   Hyperglycemia 05/30/2021   Intracerebral hemorrhage 05/24/2021   Seizure (HCC) 05/23/2021   Malnutrition of moderate degree 05/22/2021   Presence of other vascular implants and grafts 05/16/2021   Memory deficits    OSA (obstructive sleep apnea)    Generalized OA    Hypokalemia    Leukocytosis    Hemiplga following cerebral infrc  affecting left nondom side (HCC) 05/11/2021   Unspecified dementia, unspecified severity, without behavioral disturbance, psychotic disturbance, mood disturbance, and anxiety (HCC) 12/12/2020   Essential (primary) hypertension 12/13/2019   Personal history of other venous thrombosis and embolism 05/27/2019   Annual physical exam 04/03/2017    ONSET DATE:  04/12/2023 (date of referral) CVA was in March 2022  REFERRING DIAG: W09.811 (ICD-10-CM) - 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   THERAPY DIAG:  No diagnosis found.  Rationale for Evaluation and Treatment: Rehabilitation  SUBJECTIVE:   SUBJECTIVE STATEMENT: Patient's wife reports she has difficulty keeping his L hand away from his chest where he likes to tuck it.  She has a few resting hand splints for the left hand but has difficulty donning them.  At night, she applies "dementia glove" to R hand.  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  Left femoral DVT/Antithrombotics: IVC filter in place, seizures, HTN, dementia   PRECAUTIONS: Other: significant residual L spastic hemiplegia, cognitive impairment, dementia, condom catheter  WEIGHT BEARING RESTRICTIONS: No  PAIN:  Are you having pain? No  FALLS: Has patient fallen in  last 6 months? No  Lives with: lives with their spouse and family sometimes helps  Lives in: House/apartment   Has following equipment at home: Wheelchair (manual), shower chair, bed side commode, and Stedy. 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   OBJECTIVE:   HAND DOMINANCE: Right  ADLs: Overall ADLs: max to total A Transfers/ambulation related to ADLs: Eating: feeds self with right hand UB Dressing: Max A LB Dressing: total A Toileting: total A occasionally will alert wife he needs to have a BM  Bathing: max A - will wash face mostly when given washcloth  Equipment: bed side commode  IADLs: Dependent  MOBILITY STATUS:  wheelchair bound, requires assistance for propelling  POSTURE COMMENTS:  L lean Sitting balance: Poor  ACTIVITY TOLERANCE: Activity tolerance: poor  FUNCTIONAL OUTCOME MEASURES: PSFS: 3  - 05/23/2023 (eval)   Total score = sum of the activity scores/number of activities Minimum detectable change (90%CI) for average score = 2 points Minimum detectable change (90%CI) for single activity score = 3 points  UPPER EXTREMITY ROM:    RUE: WNL LUE: lacks active movement though does appear to guard with PROM  PROM (hard end feel) Right (eval) Left (eval)  Shoulder flexion WNL 80  Shoulder abduction WNL   Elbow flexion WNL   Elbow extension WNL -90  Wrist flexion WNL   Wrist extension WNL -45  Wrist pronation WNL   Wrist supination WNL    Digit Composite Flexion WNL   Digit Composite Extension WNL WFL; hyperextension PIPs  Digit Opposition WNL   (Blank rows = not tested)  UPPER EXTREMITY MMT:     RUE: WFL LUE: unable to test formally; BFL given lack of AROM though pt guards PROM  HAND FUNCTION: UTA  COORDINATION: UTA  SENSATION: UTA  EDEMA: None reported or observed  MUSCLE TONE: LUE: Severe and Hypertonic  COGNITION: Overall cognitive status: Impaired and not consistently oriented to person, place or time   VISION: Subjective report: used to wear  glasses; L VF cut following CVA  PERCEPTION: Impaired: Inattention/neglect: does not attend to left visual field and does not attend to left side of body and Spatial orientation: requires cues  PRAXIS: Impaired: Initiation and Motor planning  OBSERVATIONS: Pt appears fairly well-kept. In manual w/c with poor positioning, only has R footrest, catheter in place. Pt is strapped into w/c with a vest to prevent him from sliding out.     TODAY'S TREATMENT:                                                                                                                               -  Self Care  Patient engaged in washing his face and eyes due to L eye redness and irritation with some discharge in corner of his eye. Patient provided a warm washcloth that was rewarmed several times along with multiple cues to wash and clean his face and then allowing OTR to help, which his wife reports is rare as he tends to push her away from washing his face. - Orthotic OT made minor adjustments to thumb pan on splint to avoid excess pressure on a blister noted in his webspace.  Patient's spouse educated on donning and removing resting hand splint with frequent skin checks and use of straps to improve positioning of L wrist and digits.  Therapist encouraged spouse to locate extra splint cover. - Manual techniques Manual stretching of L UE completed with patient with spouse educated in stretching arm by using small isolating movements to relax limb as well as using some deep pressure of tight muscles ie) at elbow, pec region and/or around deltoid.  Prolonged stretching provided with use of patient's padded half wheelchair tray and reattachment of Velcro (with heat gun to increase adherence on glue) to pull arm away from his body and provide prolonged stretch of LUE.    PATIENT EDUCATION: Education details: LUE stretching & Positioning - hand role ideas Person educated: Patient and Spouse Education method: Explanation  and Demonstration Education comprehension: verbalized understanding and needs further education  HOME EXERCISE PROGRAM: N/a this date  GOALS:  SHORT TERM GOALS: Target date: 06/20/2023    Caregiver will demonstrate independence with LUE ROM HEP. Baseline: no set HEP Goal status: IN PROGRESS   LONG TERM GOALS: Target date: 07/07/2023    Patient will report at least two-point increase in average PSFS score or at least three-point increase in a single activity score indicating functionally significant improvement given minimum detectable change.  Baseline: 3 total score (individual activities r/t stretch, dress/bathing) Goal status: IN PROGRESS  2.  Pt will participate in ADLs using adaptive strategies or AD as needed.  Baseline: limited participation Goal status: IN PROGRESS  ASSESSMENT:  CLINICAL IMPRESSION: Spouse had patient's hand splint well applied today with minor adjustments to prevent excessive pressure on the blister noted in the webspace of L hand.  Patient demonstrated fairly good tolerance of LUE stretching while his wife held his R hand.  He may benefit from additional hand roll to use when splint is removed.  Patient may need elbow splint and further adjustments to L hand splint for proper positioning and to promote stretch of L UE, wrist and digits to prevent further contracture.  Patient to benefit from continued OT services to provide additional modifications to promote less guarded positioning of LUE throughout the day as needed to increase comfort with participation in ADLs.  PERFORMANCE DEFICITS: in functional skills including ADLs, IADLs, coordination, tone, ROM, strength, pain, Fine motor control, Gross motor control, mobility, body mechanics, decreased knowledge of use of DME, vision, UE functional use, and cognition  IMPAIRMENTS: are limiting patient from ADLs, IADLs, work, and leisure.   CO-MORBIDITIES: has co-morbidities such as dementia/impaired cognitive  status  that affects occupational performance. Patient will benefit from skilled OT to address above impairments and improve overall function.  REHAB POTENTIAL: Fair given chronicity of stroke and pt's cognition  PLAN:  OT FREQUENCY: 1x/week  OT DURATION: 6 weeks  PLANNED INTERVENTIONS: self care/ADL training, therapeutic exercise, therapeutic activity, manual therapy, passive range of motion, moist heat, patient/family education, cognitive remediation/compensation, visual/perceptual remediation/compensation, DME and/or AE instructions,  and Re-evaluation  RECOMMENDED OTHER SERVICES: none at this time  CONSULTED AND AGREED WITH PLAN OF CARE:  pt's spouse  PLAN FOR NEXT SESSION:   Explore possible LUE elbow positioning/ splint  Assist with L hand splint/hand roll.   Check strap for LUE positioning on WC tray,   LUE PROM HEP; ADLs   Victorino Sparrow, OT 06/07/2023, 10:34 AM

## 2023-06-07 NOTE — Therapy (Signed)
OUTPATIENT PHYSICAL THERAPY NEURO TREATMENT   Patient Name: Donald [name] MRN: [id number] DOB:August 21, 1956, 67 y.o., male Today's Date: 06/07/2023   PCP: [name], MD REFERRING PROVIDER: Ranelle Oyster, MD  END OF SESSION:  06/07/23 1019  PT Visits / Re-Eval  Visit Number 3  Number of Visits 7  Date for PT Re-Evaluation 07/22/23  Authorization  Authorization Type [employer]  PT Time Calculation  PT Start Time 1018  PT Stop Time 1058  PT Time Calculation (min) 40 min  PT - End of Session  Equipment Utilized During Treatment Gait belt  Activity Tolerance Patient tolerated treatment well  Behavior During Therapy WFL for tasks assessed/performed;Flat affect    Past Medical History:  Diagnosis Date   Arthritis    Stroke [facility])    Past Surgical History:  Procedure Laterality Date   APPENDECTOMY     IR ANGIO INTRA EXTRACRAN SEL COM CAROTID INNOMINATE BILAT MOD SED  05/28/2021   IR ANGIO VERTEBRAL SEL VERTEBRAL BILAT MOD SED  05/28/2021   IR IVC FILTER PLMT / S&I /IMG GUID/MOD SED  05/27/2021   IR US GUIDE VASC ACCESS RIGHT  05/27/2021   Patient Active Problem List   Diagnosis Date Noted   Cervical dystonia 04/12/2023   Dementia (HCC) 03/29/2022   H/O: CVA (cerebrovascular accident) 03/29/2022   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) 02/09/2022   At high risk for inadequate nutritional intake 07/12/2021   Transaminitis 06/06/2021   Intraparenchymal hematoma of brain (HCC) 06/04/2021   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HCC) 05/30/2021   Right lower lobe pneumonia 05/30/2021   Prediabetes 05/30/2021   Hyperglycemia 05/30/2021   Intracerebral hemorrhage 05/24/2021   Seizure (HCC) 05/23/2021   Malnutrition of moderate degree 05/22/2021   Presence of other vascular implants and grafts 05/16/2021   Memory deficits    OSA (obstructive sleep apnea)    Generalized OA    Hypokalemia    Leukocytosis    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 (HCC)  05/11/2021   Unspecified dementia, unspecified severity, without behavioral disturbance, psychotic disturbance, mood disturbance, and anxiety (HCC) 12/12/2020   Essential (primary) hypertension 12/13/2019   Personal history of other venous thrombosis and embolism 05/27/2019   Annual physical exam 04/03/2017    ONSET DATE: 04/12/2023 (date of referral)  REFERRING DIAG: I69.354 (ICD-10-CM) - 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 (HCC)   THERAPY DIAG:  Muscle weakness (generalized)  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)  Other lack of coordination  Abnormal posture  Rationale for Evaluation and Treatment: Rehabilitation  SUBJECTIVE:  SUBJECTIVE STATEMENT: Pt's wife brings pt in tilt-in-space manual wheelchair, tilt feature remains broken and brake remains loose, but [employer] is aware and coming to fix this issue.  He denies pain.  Spouse denies falls.    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  Left femoral DVT/Antithrombotics: IVC filter in place, seizures, HTN, dementia   PAIN:  Are you having pain? No  There were no vitals filed for this visit.    PRECAUTIONS: Other: significant residual L spastic hemiplegia, cognitive impairment, dementia, condom catheter    WEIGHT BEARING RESTRICTIONS: No  FALLS: Has patient fallen in last 6 months? No  LIVING ENVIRONMENT: Lives with: lives with their spouse and family sometimes helps  Lives in: House/apartment  Has following equipment at home: Wheelchair (manual), shower  chair, bed side commode, and Stedy. Pt's wife bathes pt in [place] when it is just her, otherwise when family is over, they bring pt to the shower chair   PLOF: Needs assistance with ADLs and Needs assistance with transfers  PATIENT GOALS: Per wife - wants to work on sitting balance, try to weight bear through L side. Work on moving his hand   OBJECTIVE:   COGNITION: Overall cognitive status: Impaired   SENSATION: Unable to assess due to cognitive deficits, pt not able to follow directions   MUSCLE TONE: LLE: Severe and Hypertonic  POSTURE: rounded shoulders, forward head, increased thoracic kyphosis, posterior pelvic tilt, flexed trunk , weight shift left, and significant trunk shortening on L side, head held in L lateral cervical flexion   Pt currently in standard manual w/c, has other personalized w/c with recline and higher back rest is broken. Pt only with R foot rest and has LLE on R foot rest.   Pt is strapped into w/c with a vest to prevent him from sliding out.    LOWER EXTREMITY ROM:    Significant hypomobility to L hamstring, L ankle DF, tested passively in sitting    LOWER EXTREMITY MMT:   Unable to assess due to cognitive deficits, pt not able to follow directions for MMT testing   Pt needing PT assist to pick up LLE during entirety of session.   BED MOBILITY:  Did not get to assess during eval due to time constraints.   TRANSFERS: Assistive device utilized:  [employer] to stand: Max A Stand to sit: Mod A Pt with significant tightness/rigidity in trunk, unable to actively lean forwards, needs max assist for forward lean and cues for RUE placement on Stedy.  Once in standing pt with significant L>R knee flexion, forward posture with incr trunk flexion, and decr L ankle ROM with L heel unable to be placed on the [employer].  In [place], attempted to cue to look upright for posture with visual cue on mirror, pt unable to follow cues for posture. Attempted to  provide additional cue at buttocks for hip extension.   When sitting back down in manual w/c, pt needing mod A for controlled descent and max cues for hip flexion (with pt unable to lean forwards).   Will have to further assess sitting balance, ran out of time during eval.   GAIT: Pt is non-ambulatory.   TODAY'S TREATMENT:       Pt transferred to sitting EOM table using Stedy and RUE to pull into standing from tilted W/C  as he does at home.  Patient requires extensive cuing this visit and increased time to engage to task.  His right gaze appears stronger today and he is mumbling to himself throughout transfer.  Once seated on EOM he relies on RUE to stay upright with increased time and various cues to get patient to transfer grip from [place] to EOM.  Once stable on the mat PT attempts to work on upright sitting posture with mirror feedback with patient unable to sustain attention and purposefully correct deficits, PT using various external cues to engage left gaze and upright posture.  Attempted to have patient progress from LAQ practiced last session to more salient soccer ball kick.  He was unable to perform knee extension, instead fast beating his RLE on the floor and asking the therapist "am I not doing it?"  Discussed concerns of deficits noted today compared to last visit and cloudiness noted in catheter bag with wife who shares concern due to reported mental fogginess of patient over last few days.  PT did encourage her to seek medical consult from his PCP in regards to potential UTI versus other medical issue due to status change.  Stedy transfer performed same as onset to return to wheelchair for handoff to OT.  Assessed BP following handoff (RUE in semi-recumbent position):  116/71, HR 70 bpm                                                                                                         PATIENT EDUCATION: Education details: Right brake loose-follow-up with [employer].  HEP and other  education as above. Person educated: Patient and Spouse Education method: Explanation Education comprehension: verbalized understanding  HOME EXERCISE PROGRAM: -RUE reaching up, laterally, forward, cross body to left knee -Sustained left holds/weight shifts -Passive hamstring stretch propping LE in chair with heels floated -Can attempt LAQ and marching of the RLE -Manual stretch of the right pec for posture -Positioning in chair to prevent worsening posture  GOALS: Goals reviewed with patient? Yes  SHORT TERM GOALS: Target date: 06/13/2023  Pt will perform initial HEP with family assist for improved ROM, posture, strength.  Baseline: Goal status: INITIAL  2.  Sitting balance to be assessed with STG/LTG written.  Baseline:  Goal status: INITIAL  3.  Pt will perform at least 3 minutes of standing in [place] with min A for improved tolerance/participation in ADLs.  Baseline: Not officially timed during eval  Goal status: INITIAL    LONG TERM GOALS: Target date: 07/04/2023  Pt will perform final HEP with family assist for improved ROM, posture, strength.  Baseline:  Goal status: INITIAL  2.  Pt will remain in upright unsupported sitting x2 minutes to improve participation in ADLs and decrease caregiver burden.    Baseline: 1 minute and 27 seconds (6/20) Goal status: INITIAL  3.  Pt will perform sit > stand from w/c to [facility] with mod A in order to decr caregiver burden.  Baseline: max A  Goal status: INITIAL  4.  Pt will perform at least 6 minutes  of standing in [place] with min A for improved tolerance/participation in ADLs.  Baseline:  Goal status: INITIAL   ASSESSMENT:  CLINICAL IMPRESSION: Patient presents to PT today with apparent status change.  He has worsened left neglect and sustained attention with therapist concerned for underlying medical cause.  Noted some cloudiness in catheter bag and discussed making PCP appt with caregiver who was in agreement.  He appeared  stable to complete OT so vitals assessed and handoff to OT provided.  Will continue per POC as medically safe to do so.  OBJECTIVE IMPAIRMENTS: decreased activity tolerance, decreased balance, decreased cognition, decreased coordination, decreased knowledge of condition, decreased mobility, decreased ROM, decreased strength, hypomobility, impaired flexibility, impaired sensation, impaired tone, impaired UE functional use, impaired vision/preception, and postural dysfunction.   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  Left femoral DVT/Antithrombotics: IVC filter in place, seizures, HTN, dementia   are also affecting patient's functional outcome.   REHAB POTENTIAL: Fair due to time since onset, significant residual spastic hemiplegia and cognitive impairment from CVA in 2022, dementia   CLINICAL DECISION MAKING: Evolving/moderate complexity  EVALUATION COMPLEXITY: Moderate  PLAN:  PT FREQUENCY: 1x/week  PT DURATION: 8 weeks - only anticipate 6 weeks, due to delay in scheduling   PLANNED INTERVENTIONS: Therapeutic exercises, Therapeutic activity, Neuromuscular re-education, Balance training, Gait training, Patient/Family education, Self Care, Joint mobilization, Manual therapy, and Re-evaluation  PLAN FOR NEXT SESSION: Transfer with stedy to mat table.  Add to HEP as needed for ROM/stretching for pt's wife and other help at home  to perform.  Try seated LE engagement w/ soccer ball kicks and 2" step target.  If reaching task patient responds well to high-fives.  May do well with blaze pods.  ASSESS STGs!  Did they see PCP, patient doing better today?  Sadie Haber, PT, DPT  06/07/2023, 11:02 AM

## 2023-06-13 ENCOUNTER — Ambulatory Visit: Payer: Commercial Managed Care - PPO | Admitting: Occupational Therapy

## 2023-06-13 ENCOUNTER — Ambulatory Visit: Payer: Commercial Managed Care - PPO | Attending: Physical Medicine & Rehabilitation | Admitting: Physical Therapy

## 2023-06-13 DIAGNOSIS — I69154 Hemiplegia and hemiparesis following nontraumatic intracerebral hemorrhage affecting left non-dominant side: Secondary | ICD-10-CM | POA: Diagnosis present

## 2023-06-13 DIAGNOSIS — M25612 Stiffness of left shoulder, not elsewhere classified: Secondary | ICD-10-CM | POA: Insufficient documentation

## 2023-06-13 DIAGNOSIS — M24522 Contracture, left elbow: Secondary | ICD-10-CM

## 2023-06-13 DIAGNOSIS — M25632 Stiffness of left wrist, not elsewhere classified: Secondary | ICD-10-CM | POA: Insufficient documentation

## 2023-06-13 DIAGNOSIS — M6281 Muscle weakness (generalized): Secondary | ICD-10-CM | POA: Insufficient documentation

## 2023-06-13 DIAGNOSIS — R278 Other lack of coordination: Secondary | ICD-10-CM | POA: Diagnosis present

## 2023-06-13 DIAGNOSIS — M24542 Contracture, left hand: Secondary | ICD-10-CM

## 2023-06-13 DIAGNOSIS — R293 Abnormal posture: Secondary | ICD-10-CM | POA: Diagnosis present

## 2023-06-13 DIAGNOSIS — R41841 Cognitive communication deficit: Secondary | ICD-10-CM | POA: Insufficient documentation

## 2023-06-13 NOTE — Therapy (Unsigned)
OUTPATIENT OCCUPATIONAL THERAPY NEURO TREATMENT  Patient Name: Donald [name] MRN: [id number] DOB:[birth year], 67 y.o., male Today's Date: 06/13/2023  PCP: [name], MD  REFERRING PROVIDER: Ranelle Oyster, MD  END OF SESSION:   OT End of Session - 06/13/23 1058     Visit Number 4    Number of Visits 7    Date for OT Re-Evaluation 07/07/23    Authorization Type [employer] Medicare    Progress Note Due on Visit 7    OT Start Time 1100    OT Stop Time 1145    OT Time Calculation (min) 45 min    Equipment Utilized During Treatment Patient in WC; L hand splint    Activity Tolerance Patient tolerated treatment well   limited by cognition and some confusion today   Behavior During Therapy WFL for tasks assessed/performed;Flat affect             Past Medical History:  Diagnosis Date   Arthritis    Stroke [facility])    Past Surgical History:  Procedure Laterality Date   APPENDECTOMY     IR ANGIO INTRA EXTRACRAN SEL COM CAROTID INNOMINATE BILAT MOD SED  05/28/2021   IR ANGIO VERTEBRAL SEL VERTEBRAL BILAT MOD SED  05/28/2021   IR IVC FILTER PLMT / S&I /IMG GUID/MOD SED  05/27/2021   IR US GUIDE VASC ACCESS RIGHT  05/27/2021   Patient Active Problem List   Diagnosis Date Noted   Cervical dystonia 04/12/2023   Dementia (HCC) 03/29/2022   H/O: CVA (cerebrovascular accident) 03/29/2022   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) 02/09/2022   At high risk for inadequate nutritional intake 07/12/2021   Transaminitis 06/06/2021   Intraparenchymal hematoma of brain (HCC) 06/04/2021   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HCC) 05/30/2021   Right lower lobe pneumonia 05/30/2021   Prediabetes 05/30/2021   Hyperglycemia 05/30/2021   Intracerebral hemorrhage 05/24/2021   Seizure (HCC) 05/23/2021   Malnutrition of moderate degree 05/22/2021   Presence of other vascular implants and grafts 05/16/2021   Memory deficits    OSA (obstructive sleep apnea)    Generalized OA    Hypokalemia     Leukocytosis    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 (HCC) 05/11/2021   Unspecified dementia, unspecified severity, without behavioral disturbance, psychotic disturbance, mood disturbance, and anxiety (HCC) 12/12/2020   Essential (primary) hypertension 12/13/2019   Personal history of other venous thrombosis and embolism 05/27/2019   Annual physical exam 04/03/2017    ONSET DATE:  04/12/2023 (date of referral) CVA was in March 2022  REFERRING DIAG: W09.811 (ICD-10-CM) - 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   THERAPY DIAG:  Contracture of joint of left hand  Stiffness of left wrist, not elsewhere classified  Stiffness of left shoulder, not elsewhere classified  Contracture, elbow, left  Rationale for Evaluation and Treatment: Rehabilitation  SUBJECTIVE:   SUBJECTIVE STATEMENT: Patient's wife reports she has difficulty keeping his L hand away from his chest where he likes to tuck it.  She has a few resting hand splints for the left hand but has difficulty donning them.  At night, she applies "dementia glove" to R hand.  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  Left femoral DVT/Antithrombotics: IVC filter in place,  seizures, HTN, dementia   PRECAUTIONS: Other: significant residual L spastic hemiplegia, cognitive impairment, dementia, condom catheter  WEIGHT BEARING RESTRICTIONS: No  PAIN:  Are you having pain? No  FALLS: Has patient fallen in last 6 months? No  Lives with: lives with their spouse and family sometimes helps  Lives in: House/apartment   Has following equipment at home: Wheelchair (manual), shower chair, bed side commode, and Stedy. 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   OBJECTIVE:   HAND DOMINANCE: Right  ADLs: Overall ADLs: max to total A Transfers/ambulation related to ADLs: Eating: feeds self with right hand UB Dressing: Max A LB Dressing: total A Toileting: total A occasionally will alert wife he needs to have a BM  Bathing: max A - will wash face mostly when given washcloth  Equipment: bed side commode  IADLs: Dependent  MOBILITY STATUS:  wheelchair bound, requires assistance for propelling  POSTURE COMMENTS:  L lean Sitting balance: Poor  ACTIVITY TOLERANCE: Activity tolerance: poor  FUNCTIONAL OUTCOME MEASURES: PSFS: 3  - 05/23/2023 (eval)   Total score = sum of the activity scores/number of activities Minimum detectable change (90%CI) for average score = 2 points Minimum detectable change (90%CI) for single activity score = 3 points  UPPER EXTREMITY ROM:    RUE: WNL LUE: lacks active movement though does appear to guard with PROM  PROM (hard end feel) Right (eval) Left (eval)  Shoulder flexion WNL 80  Shoulder abduction WNL   Elbow flexion WNL   Elbow extension WNL -90  Wrist flexion WNL   Wrist extension WNL -45  Wrist pronation WNL   Wrist supination WNL    Digit Composite Flexion WNL   Digit Composite Extension WNL WFL; hyperextension PIPs  Digit Opposition WNL   (Blank rows = not tested)  UPPER EXTREMITY MMT:     RUE: WFL LUE: unable to test formally; BFL given lack of AROM though pt guards PROM  HAND FUNCTION: UTA  COORDINATION: UTA  SENSATION: UTA  EDEMA: None reported or  observed  MUSCLE TONE: LUE: Severe and Hypertonic  COGNITION: Overall cognitive status: Impaired and not consistently oriented to person, place or time   VISION: Subjective report: used to wear glasses; L VF cut following CVA  PERCEPTION: Impaired: Inattention/neglect: does not attend to left visual field and does not attend to left side of body and Spatial orientation: requires cues  PRAXIS: Impaired: Initiation and Motor planning  OBSERVATIONS: Pt appears fairly well-kept. In manual w/c with poor positioning, only has R footrest, catheter in place. Pt is strapped into w/c with a vest to prevent him from sliding out.     TODAY'S TREATMENT:                                                                                                                               -  Self Care  Patient engaged in washing his face and eyes due to L eye redness and irritation with some discharge in corner of his eye. Patient provided a warm washcloth that was rewarmed several times along with multiple cues to wash and clean his face and then allowing OTR to help, which his wife reports is rare as he tends to push her away from washing his face. - Orthotic OT made minor adjustments to thumb pan on splint to avoid excess pressure on a blister noted in his webspace.  Patient's spouse educated on donning and removing resting hand splint with frequent skin checks and use of straps to improve positioning of L wrist and digits.  Therapist encouraged spouse to locate extra splint cover. - Manual techniques Manual stretching of L UE completed with patient with spouse educated in stretching arm by using small isolating movements to relax limb as well as using some deep pressure of tight muscles ie) at elbow, pec region and/or around deltoid.  Prolonged stretching provided with use of patient's padded half wheelchair tray and reattachment of Velcro (with heat gun to increase adherence on glue) to pull arm away from his  body and provide prolonged stretch of LUE.    PATIENT EDUCATION: Education details: LUE stretching & Positioning - hand role ideas Person educated: Patient and Spouse Education method: Explanation and Demonstration Education comprehension: verbalized understanding and needs further education  HOME EXERCISE PROGRAM: N/a this date  GOALS:  SHORT TERM GOALS: Target date: 06/20/2023    Caregiver will demonstrate independence with LUE ROM HEP. Baseline: no set HEP Goal status: IN PROGRESS   LONG TERM GOALS: Target date: 07/07/2023    Patient will report at least two-point increase in average PSFS score or at least three-point increase in a single activity score indicating functionally significant improvement given minimum detectable change.  Baseline: 3 total score (individual activities r/t stretch, dress/bathing) Goal status: IN PROGRESS  2.  Pt will participate in ADLs using adaptive strategies or AD as needed.  Baseline: limited participation Goal status: IN PROGRESS  ASSESSMENT:  CLINICAL IMPRESSION: Spouse had patient's hand splint well applied today with minor adjustments to prevent excessive pressure on the blister noted in the webspace of L hand.  Patient demonstrated fairly good tolerance of LUE stretching while his wife held his R hand.  He may benefit from additional hand roll to use when splint is removed.  Patient may need elbow splint and further adjustments to L hand splint for proper positioning and to promote stretch of L UE, wrist and digits to prevent further contracture.  Patient to benefit from continued OT services to provide additional modifications to promote less guarded positioning of LUE throughout the day as needed to increase comfort with participation in ADLs.  PERFORMANCE DEFICITS: in functional skills including ADLs, IADLs, coordination, tone, ROM, strength, pain, Fine motor control, Gross motor control, mobility, body mechanics, decreased knowledge of  use of DME, vision, UE functional use, and cognition  IMPAIRMENTS: are limiting patient from ADLs, IADLs, work, and leisure.   CO-MORBIDITIES: has co-morbidities such as dementia/impaired cognitive status  that affects occupational performance. Patient will benefit from skilled OT to address above impairments and improve overall function.  REHAB POTENTIAL: Fair given chronicity of stroke and pt's cognition  PLAN:  OT FREQUENCY: 1x/week  OT DURATION: 6 weeks  PLANNED INTERVENTIONS: self care/ADL training, therapeutic exercise, therapeutic activity, manual therapy, passive range of motion, moist heat, patient/family education, cognitive remediation/compensation, visual/perceptual remediation/compensation, DME and/or AE instructions,  and Re-evaluation  RECOMMENDED OTHER SERVICES: none at this time  CONSULTED AND AGREED WITH PLAN OF CARE:  pt's spouse  PLAN FOR NEXT SESSION:   Explore possible LUE elbow positioning/ splint  Assist with L hand splint/hand roll.   Check strap for LUE positioning on WC tray,   LUE PROM HEP; ADLs   Victorino Sparrow, OT 06/13/2023, 12:14 PM

## 2023-06-13 NOTE — Therapy (Signed)
OUTPATIENT PHYSICAL THERAPY NEURO TREATMENT   Patient Name: Brandn Sliwinski MRN: [id number] DOB:Feb 06, 1956, 67 y.o., male Today's Date: 06/13/2023   PCP: [name], MD REFERRING PROVIDER: Ranelle Oyster, MD  END OF SESSION:  PT End of Session - 06/13/23 1019     Visit Number 4    Number of Visits 7    Date for PT Re-Evaluation 07/22/23    Authorization Type UHC    PT Start Time 1017    PT Stop Time 1056    PT Time Calculation (min) 39 min    Equipment Utilized During Treatment Gait belt    Activity Tolerance Patient tolerated treatment well    Behavior During Therapy WFL for tasks assessed/performed;Flat affect              Past Medical History:  Diagnosis Date   Arthritis    Stroke [facility])    Past Surgical History:  Procedure Laterality Date   APPENDECTOMY     IR ANGIO INTRA EXTRACRAN SEL COM CAROTID INNOMINATE BILAT MOD SED  05/28/2021   IR ANGIO VERTEBRAL SEL VERTEBRAL BILAT MOD SED  05/28/2021   IR IVC FILTER PLMT / S&I /IMG GUID/MOD SED  05/27/2021   IR US GUIDE VASC ACCESS RIGHT  05/27/2021   Patient Active Problem List   Diagnosis Date Noted   Cervical dystonia 04/12/2023   Dementia (HCC) 03/29/2022   H/O: CVA (cerebrovascular accident) 03/29/2022   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) 02/09/2022   At high risk for inadequate nutritional intake 07/12/2021   Transaminitis 06/06/2021   Intraparenchymal hematoma of brain (HCC) 06/04/2021   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HCC) 05/30/2021   Right lower lobe pneumonia 05/30/2021   Prediabetes 05/30/2021   Hyperglycemia 05/30/2021   Intracerebral hemorrhage 05/24/2021   Seizure (HCC) 05/23/2021   Malnutrition of moderate degree 05/22/2021   Presence of other vascular implants and grafts 05/16/2021   Memory deficits    OSA (obstructive sleep apnea)    Generalized OA    Hypokalemia    Leukocytosis    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 (HCC) 05/11/2021   Unspecified  dementia, unspecified severity, without behavioral disturbance, psychotic disturbance, mood disturbance, and anxiety (HCC) 12/12/2020   Essential (primary) hypertension 12/13/2019   Personal history of other venous thrombosis and embolism 05/27/2019   Annual physical exam 04/03/2017    ONSET DATE: 04/12/2023 (date of referral)  REFERRING DIAG: I69.354 (ICD-10-CM) - 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 (HCC)   THERAPY DIAG:  Muscle weakness (generalized)  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)  Abnormal posture  Rationale for Evaluation and Treatment: Rehabilitation  SUBJECTIVE:  SUBJECTIVE STATEMENT: Karmesh fixed his wheelchair. Had to go on the [employer] over the weekend to go to [place]. Had not reached out to the PCP about potential UTI. 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  Left femoral DVT/Antithrombotics: IVC filter in place, seizures, HTN, dementia   PAIN:  Are you having pain? No  There were no vitals filed for this visit.    PRECAUTIONS: Other: significant residual L spastic hemiplegia, cognitive impairment, dementia, condom catheter    WEIGHT BEARING RESTRICTIONS: No  FALLS: Has patient fallen in last 6 months? No  LIVING ENVIRONMENT: Lives with: lives with their spouse and family sometimes helps  Lives in: House/apartment  Has following equipment at home: Wheelchair (manual), shower chair, bed side commode, and Stedy. Pt's wife bathes pt in [place] when it is  just her, otherwise when family is over, they bring pt to the shower chair   PLOF: Needs assistance with ADLs and Needs assistance with transfers  PATIENT GOALS: Per wife - wants to work on sitting balance, try to weight bear through L side. Work on moving his hand   OBJECTIVE:   COGNITION: Overall cognitive status: Impaired   SENSATION: Unable to assess due to cognitive deficits, pt not able to follow directions   MUSCLE TONE: LLE: Severe and Hypertonic  POSTURE: rounded shoulders, forward head, increased thoracic kyphosis, posterior pelvic tilt, flexed trunk , weight shift left, and significant trunk shortening on L side, head held in L lateral cervical flexion   Pt currently in standard manual w/c, has other personalized w/c with recline and higher back rest is broken. Pt only with R foot rest and has LLE on R foot rest.   Pt is strapped into w/c with a vest to prevent him from sliding out.    LOWER EXTREMITY ROM:    Significant hypomobility to L hamstring, L ankle DF, tested passively in sitting    LOWER EXTREMITY MMT:   Unable to assess due to cognitive deficits, pt not able to follow directions for MMT testing   Pt needing PT assist to pick up LLE during entirety of session.   BED MOBILITY:  Did not get to assess during eval due to time constraints.   TRANSFERS: Assistive device utilized:  [employer] to stand: Max A Stand to sit: Mod A Pt with significant tightness/rigidity in trunk, unable to actively lean forwards, needs max assist for forward lean and cues for RUE placement on Stedy.  Once in standing pt with significant L>R knee flexion, forward posture with incr trunk flexion, and decr L ankle ROM with L heel unable to be placed on the [employer].  In [place], attempted to cue to look upright for posture with visual cue on mirror, pt unable to follow cues for posture. Attempted to provide additional cue at buttocks for hip extension.   When sitting back  down in manual w/c, pt needing mod A for controlled descent and max cues for hip flexion (with pt unable to lean forwards).   GAIT: Pt is non-ambulatory.   TODAY'S TREATMENT:      Pt transferred from w/c (pt with improved positioning today due to w/c being fixed at last session and pt no longer tilted posteriorly) to mat table using Stedy. Pt's spouse plans to bring head  piece to next session. Pt mumbling to himself throughout session today. With initial stand from [facility], pt needing verbal/tactile/demo cues for proper RUE placement on farthest [facility] bar, pt able to perform with min A with heavy reliance on RUE to come to stand. Pt transferred dependently with Stedy over to mat table. Pt stayed standing for approx. 5 minutes with reliance on RUE to stay standing and PT providing CGA/min A as needed for more upright posture and to prevent pt from sitting back on pads. Pt with heavy lean to the L side and forward flexed posture. 2nd PT on pt's L holding up different fingers to see if pt would look up and name how many fingers he was holding up to work on attention to L side and upright posture by trying to look up. PT giving max verbal cues and even having pt follow therapist to look up. Despite max external cues, pt only able to name how many fingers one time, otherwise throughout task, pt mumbling. Had pt sit down to mat table with mod A for controlled descent to mat and cued for proper RUE placement. In sitting, had chair and 2 pillows behind pt due to pt with heavy lean posteriorly and to the L. Pt initially needing mod A to come forward and then faded to CGA/min A > CGA for sitting balance, with pt able to sit unsupported for approx. 10 minutes today. During sitting balance, PT holding up bean bags for pt to grab with R hand and tossing into basket into front of him with RUE. MAX external and verbal cues to attend to task and grab bean bag and then toss it big. Pt able to perform at times, but did vary at  times at how much he was engaged to task and how quickly he would grab bean bag. At end of session, pt needing mod-max A x2 to pull to stand from [place] from lower mat table. Pt transferred dependently back to w/c with Stedy and mod A for stand > sit from [place] for controlled descent. Handoff to OT at end of session.                                             PATIENT EDUCATION: Education details: Continue HEP, following up with PCP regarding cloudy urine and smell (previous PT also concerned with UTI and also educated last week to follow up with PCP) Person educated: Patient and Spouse Education method: Explanation Education comprehension: verbalized understanding  HOME EXERCISE PROGRAM: -RUE reaching up, laterally, forward, cross body to left knee -Sustained left holds/weight shifts -Passive hamstring stretch propping LE in chair with heels floated -Can attempt LAQ and marching of the RLE -Manual stretch of the right pec for posture -Positioning in chair to prevent worsening posture  GOALS: Goals reviewed with patient? Yes  SHORT TERM GOALS: Target date: 06/13/2023  Pt will perform initial HEP with family assist for improved ROM, posture, strength.  Baseline: Goal status: MET  2.  Sitting balance to be assessed with STG/LTG written.  Baseline: LTG written  Goal status: MET  3.  Pt will perform at least 3 minutes of standing in [place] with min A for improved tolerance/participation in ADLs.  Baseline: pt able to stand ~5 minutes with CGA/min A  Goal status: MET    LONG TERM GOALS: Target date: 07/04/2023  Pt will perform  final HEP with family assist for improved ROM, posture, strength.  Baseline:  Goal status: INITIAL  2.  Pt will remain in upright unsupported sitting x2 minutes to improve participation in ADLs and decrease caregiver burden.    Baseline: 1 minute and 27 seconds (6/20) Goal status: INITIAL  3.  Pt will perform sit > stand from w/c to [facility] with mod A in order  to decr caregiver burden.  Baseline: max A  Goal status: INITIAL  4.  Pt will perform at least 6 minutes of standing in [place] with min A for improved tolerance/participation in ADLs.  Baseline:  Goal status: INITIAL   ASSESSMENT:  CLINICAL IMPRESSION: Pt with improvements of sustained attention today compared to previous PT session. However, still cloudiness noted in catheter bag so continued to educate pt's spouse to follow-up with PCP regarding this. Today's session focused on standing tolerance in [place] with RUE support and seated balance. Pt initially needing more support initially for keeping weight anteriorly, but with incr time in sitting and focused to task on reaching and grabbing bean bag, pt able to sit unsupported with CGA. Pt fatigued after 10-15 minutes of sitting balance and reverted back to incr forward lean and to the L in sitting. Will continue per POC.   OBJECTIVE IMPAIRMENTS: decreased activity tolerance, decreased balance, decreased cognition, decreased coordination, decreased knowledge of condition, decreased mobility, decreased ROM, decreased strength, hypomobility, impaired flexibility, impaired sensation, impaired tone, impaired UE functional use, impaired vision/preception, and postural dysfunction.   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  Left femoral DVT/Antithrombotics: IVC filter in place,  seizures, HTN, dementia   are also affecting patient's functional outcome.   REHAB POTENTIAL: Fair due to time since onset, significant residual spastic hemiplegia and cognitive impairment from CVA in 2022, dementia   CLINICAL DECISION MAKING: Evolving/moderate complexity  EVALUATION COMPLEXITY: Moderate  PLAN:  PT FREQUENCY: 1x/week  PT DURATION: 8 weeks - only anticipate 6 weeks, due to delay in scheduling   PLANNED INTERVENTIONS: Therapeutic exercises, Therapeutic activity, Neuromuscular re-education, Balance training, Gait training, Patient/Family education, Self Care, Joint mobilization, Manual therapy, and Re-evaluation  PLAN FOR NEXT SESSION: Transfer with stedy to mat table.  Add to HEP as needed for ROM/stretching for pt's wife and other help at home to perform.  Try seated LE engagement w/ soccer ball kicks and 2" step target.  If reaching task patient responds well to high-fives.  May do well with blaze pods.    Drake Leach, PT, DPT  06/13/2023, 11:00 AM

## 2023-06-20 ENCOUNTER — Encounter: Payer: Self-pay | Admitting: Physical Therapy

## 2023-06-20 ENCOUNTER — Ambulatory Visit: Payer: Commercial Managed Care - PPO | Admitting: Occupational Therapy

## 2023-06-20 ENCOUNTER — Ambulatory Visit: Payer: Commercial Managed Care - PPO | Admitting: Physical Therapy

## 2023-06-20 DIAGNOSIS — I69154 Hemiplegia and hemiparesis following nontraumatic intracerebral hemorrhage affecting left non-dominant side: Secondary | ICD-10-CM

## 2023-06-20 DIAGNOSIS — M24522 Contracture, left elbow: Secondary | ICD-10-CM

## 2023-06-20 DIAGNOSIS — M25632 Stiffness of left wrist, not elsewhere classified: Secondary | ICD-10-CM

## 2023-06-20 DIAGNOSIS — M6281 Muscle weakness (generalized): Secondary | ICD-10-CM

## 2023-06-20 DIAGNOSIS — R278 Other lack of coordination: Secondary | ICD-10-CM

## 2023-06-20 DIAGNOSIS — M24542 Contracture, left hand: Secondary | ICD-10-CM

## 2023-06-20 DIAGNOSIS — R293 Abnormal posture: Secondary | ICD-10-CM

## 2023-06-20 DIAGNOSIS — M25612 Stiffness of left shoulder, not elsewhere classified: Secondary | ICD-10-CM

## 2023-06-20 DIAGNOSIS — R41841 Cognitive communication deficit: Secondary | ICD-10-CM

## 2023-06-20 NOTE — Therapy (Signed)
OUTPATIENT OCCUPATIONAL THERAPY NEURO TREATMENT  Patient Name: Donald [name] MRN: [id number] DOB:12-29-1955, 67 y.o., male Today's Date: 06/20/2023  PCP: [name], MD  REFERRING PROVIDER: Ranelle Oyster, MD  END OF SESSION:   OT End of Session - 06/20/23 1719     Visit Number 5    Number of Visits 7    Date for OT Re-Evaluation 07/07/23    Authorization Type [employer]    Progress Note Due on Visit 7    OT Start Time 1237    OT Stop Time 1315    OT Time Calculation (min) 38 min    Equipment Utilized During Treatment Patient in WC; L hand splint    Activity Tolerance Patient tolerated treatment well   limited by cognition and some confusion today   Behavior During Therapy WFL for tasks assessed/performed;Flat affect              Past Medical History:  Diagnosis Date   Arthritis    Stroke [facility])    Past Surgical History:  Procedure Laterality Date   APPENDECTOMY     IR ANGIO INTRA EXTRACRAN SEL COM CAROTID INNOMINATE BILAT MOD SED  05/28/2021   IR ANGIO VERTEBRAL SEL VERTEBRAL BILAT MOD SED  05/28/2021   IR IVC FILTER PLMT / S&I /IMG GUID/MOD SED  05/27/2021   IR US GUIDE VASC ACCESS RIGHT  05/27/2021   Patient Active Problem List   Diagnosis Date Noted   Cervical dystonia 04/12/2023   Dementia (HCC) 03/29/2022   H/O: CVA (cerebrovascular accident) 03/29/2022   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) 02/09/2022   At high risk for inadequate nutritional intake 07/12/2021   Transaminitis 06/06/2021   Intraparenchymal hematoma of brain (HCC) 06/04/2021   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HCC) 05/30/2021   Right lower lobe pneumonia 05/30/2021   Prediabetes 05/30/2021   Hyperglycemia 05/30/2021   Intracerebral hemorrhage 05/24/2021   Seizure (HCC) 05/23/2021   Malnutrition of moderate degree 05/22/2021   Presence of other vascular implants and grafts 05/16/2021   Memory deficits    OSA (obstructive sleep apnea)    Generalized OA    Hypokalemia     Leukocytosis    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 (HCC) 05/11/2021   Unspecified dementia, unspecified severity, without behavioral disturbance, psychotic disturbance, mood disturbance, and anxiety (HCC) 12/12/2020   Essential (primary) hypertension 12/13/2019   Personal history of other venous thrombosis and embolism 05/27/2019   Annual physical exam 04/03/2017    ONSET DATE:  04/12/2023 (date of referral) CVA was in March 2022  REFERRING DIAG: W29.562 (ICD-10-CM) - 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   THERAPY DIAG:  Contracture of joint of left hand  Stiffness of left wrist, not elsewhere classified  Stiffness of left shoulder, not elsewhere classified  Contracture, elbow, left  Muscle weakness (generalized)  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)  Other lack of coordination  Cognitive communication deficit  Rationale for Evaluation and Treatment: Rehabilitation  SUBJECTIVE:   SUBJECTIVE STATEMENT: Pt's wife reports he remains guarded with LUE PROM efforts but has been better able to achieve shoulder abduction and elbow extension with padded half wheelchair tray strap.    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  Left femoral DVT/Antithrombotics: IVC filter in place, seizures, HTN, dementia   PRECAUTIONS: Other: significant residual L spastic hemiplegia, cognitive impairment, dementia, condom catheter  WEIGHT BEARING RESTRICTIONS: No  PAIN:  Are you having pain? No  FALLS: Has patient fallen in last 6 months? No  Lives with: lives with their spouse and family sometimes  helps  Lives in: House/apartment   Has following equipment at home: Wheelchair (manual), shower chair, bed side commode, and Stedy. 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   OBJECTIVE:   HAND DOMINANCE: Right  ADLs: Overall ADLs: max to total A Transfers/ambulation related to ADLs: Eating: feeds self with right hand UB Dressing: Max A LB Dressing: total A Toileting: total A occasionally will alert wife he needs to have a BM  Bathing: max A - will wash face mostly when given washcloth  Equipment: bed side commode  IADLs: Dependent  MOBILITY STATUS:  wheelchair bound, requires assistance for propelling  POSTURE COMMENTS:  L lean Sitting balance: Poor  ACTIVITY TOLERANCE: Activity tolerance: poor  FUNCTIONAL OUTCOME MEASURES: PSFS: 3  - 05/23/2023 (eval)   Total score = sum of the activity scores/number of activities Minimum detectable change (90%CI) for average score = 2 points Minimum detectable change (90%CI) for single activity score = 3 points  UPPER EXTREMITY ROM:    RUE: WNL LUE: lacks active movement though does appear to guard with PROM  PROM (hard end feel) Right (eval) Left (eval)  Shoulder flexion WNL 80  Shoulder abduction WNL   Elbow flexion WNL   Elbow extension WNL -90  Wrist flexion WNL   Wrist extension WNL -45  Wrist pronation WNL   Wrist supination WNL    Digit Composite Flexion WNL   Digit Composite Extension WNL WFL; hyperextension PIPs  Digit Opposition WNL   (Blank rows = not tested)  UPPER EXTREMITY MMT:     RUE: WFL LUE: unable to test  formally; BFL given lack of AROM though pt guards PROM  HAND FUNCTION: UTA  COORDINATION: UTA  SENSATION: UTA  EDEMA: None reported or observed  MUSCLE TONE: LUE: Severe and Hypertonic  COGNITION: Overall cognitive status: Impaired and not consistently oriented to person, place or time   VISION: Subjective report: used to wear glasses; L VF cut following CVA  PERCEPTION: Impaired: Inattention/neglect: does not attend to left visual field and does not attend to left side of body and Spatial orientation: requires cues  PRAXIS: Impaired: Initiation and Motor planning  OBSERVATIONS: Pt appears fairly well-kept. In manual w/c with poor positioning, only has R footrest, catheter in place. Pt is strapped into w/c with a vest to prevent him from sliding out.     TODAY'S TREATMENT:                                                                                                                               -  Self Care   OT educated pt's spouse on hand under hand technique for dressing and PROM efforts.   PATIENT EDUCATION: Education details: Hand under hand; LUE stretching & Dressing Person educated: Patient and Spouse Education method: Explanation and Demonstration Education comprehension: verbalized understanding and needs further education  HOME EXERCISE PROGRAM: 06/21/2023 - hand under hand techniques  GOALS:  SHORT TERM GOALS: Target date: 06/20/2023    Caregiver will demonstrate independence with LUE ROM HEP. Baseline: no set HEP Goal status: IN PROGRESS   LONG TERM GOALS: Target date: 07/07/2023    Patient will report at least two-point increase in average PSFS score or at least three-point increase in a single activity score indicating functionally significant improvement given minimum detectable change.  Baseline: 3 total score (individual activities r/t stretch, dress/bathing) Goal status: IN PROGRESS  2.  Pt will participate in ADLs using adaptive strategies  or AD as needed.  Baseline: limited participation Goal status: IN PROGRESS  ASSESSMENT:  CLINICAL IMPRESSION: Pt to benefit from adaptive ADL and stretching strategies due to cognition and to increase level of participation in daily activities.    PERFORMANCE DEFICITS: in functional skills including ADLs, IADLs, coordination, tone, ROM, strength, pain, Fine motor control, Gross motor control, mobility, body mechanics, decreased knowledge of use of DME, vision, UE functional use, and cognition  IMPAIRMENTS: are limiting patient from ADLs, IADLs, work, and leisure.   CO-MORBIDITIES: has co-morbidities such as dementia/impaired cognitive status  that affects occupational performance. Patient will benefit from skilled OT to address above impairments and improve overall function.  REHAB POTENTIAL: Fair given chronicity of stroke and pt's cognition  PLAN:  OT FREQUENCY: 1x/week  OT DURATION: 6 weeks  PLANNED INTERVENTIONS: self care/ADL training, therapeutic exercise, therapeutic activity, manual therapy, passive range of motion, moist heat, patient/family education, cognitive remediation/compensation, visual/perceptual remediation/compensation, DME and/or AE instructions, and Re-evaluation  RECOMMENDED OTHER SERVICES: none at this time  CONSULTED AND AGREED WITH PLAN OF CARE:  pt's spouse  PLAN FOR NEXT SESSION:   Explore possible LUE elbow positioning/ splint  Assist with L hand splint/hand roll as needed.   Review: LUE PROM HEP; ADLs - hand under hand technique   Delana Meyer, OT 06/20/2023, 5:21 PM

## 2023-06-20 NOTE — Patient Instructions (Signed)
  [email or web address]

## 2023-06-20 NOTE — Therapy (Signed)
OUTPATIENT PHYSICAL THERAPY NEURO TREATMENT   Patient Name: Donald [name] MRN: [id number] DOB:07-01-1956, 67 y.o., male Today's Date: 06/20/2023   PCP: [name], MD REFERRING PROVIDER: Ranelle Oyster, MD  END OF SESSION:  PT End of Session - 06/20/23 1021     Visit Number 5    Number of Visits 7    Date for PT Re-Evaluation 07/22/23    Authorization Type [employer]HC    PT Start Time 1018    PT Stop Time 1102    PT Time Calculation (min) 44 min    Equipment Utilized During Treatment Gait belt    Activity Tolerance Patient tolerated treatment well    Behavior During Therapy WFL for tasks assessed/performed;Flat affect              Past Medical History:  Diagnosis Date   Arthritis    Stroke [facility])    Past Surgical History:  Procedure Laterality Date   APPENDECTOMY     IR ANGIO INTRA EXTRACRAN SEL COM CAROTID INNOMINATE BILAT MOD SED  05/28/2021   IR ANGIO VERTEBRAL SEL VERTEBRAL BILAT MOD SED  05/28/2021   IR IVC FILTER PLMT / S&I /IMG GUID/MOD SED  05/27/2021   IR US GUIDE VASC ACCESS RIGHT  05/27/2021   Patient Active Problem List   Diagnosis Date Noted   Cervical dystonia 04/12/2023   Dementia (HCC) 03/29/2022   H/O: CVA (cerebrovascular accident) 03/29/2022   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) 02/09/2022   At high risk for inadequate nutritional intake 07/12/2021   Transaminitis 06/06/2021   Intraparenchymal hematoma of brain (HCC) 06/04/2021   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HCC) 05/30/2021   Right lower lobe pneumonia 05/30/2021   Prediabetes 05/30/2021   Hyperglycemia 05/30/2021   Intracerebral hemorrhage 05/24/2021   Seizure (HCC) 05/23/2021   Malnutrition of moderate degree 05/22/2021   Presence of other vascular implants and grafts 05/16/2021   Memory deficits    OSA (obstructive sleep apnea)    Generalized OA    Hypokalemia    Leukocytosis    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 (HCC) 05/11/2021   Unspecified  dementia, unspecified severity, without behavioral disturbance, psychotic disturbance, mood disturbance, and anxiety (HCC) 12/12/2020   Essential (primary) hypertension 12/13/2019   Personal history of other venous thrombosis and embolism 05/27/2019   Annual physical exam 04/03/2017    ONSET DATE: 04/12/2023 (date of referral)  REFERRING DIAG: I69.354 (ICD-10-CM) - 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 (HCC)   THERAPY DIAG:  Muscle weakness (generalized)  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)  Abnormal posture  Rationale for Evaluation and Treatment: Rehabilitation  SUBJECTIVE:  SUBJECTIVE STATEMENT: Felt good after last session. Pt's wife reports they have been working on him sitting upright longer on his own.   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  Left femoral DVT/Antithrombotics: IVC filter in place, seizures, HTN, dementia   PAIN:  Are you having pain? No  There were no vitals filed for this visit.    PRECAUTIONS: Other: significant residual L spastic hemiplegia, cognitive impairment, dementia, condom catheter    WEIGHT BEARING RESTRICTIONS: No  FALLS: Has patient fallen in last 6 months? No  LIVING ENVIRONMENT: Lives with: lives with their spouse and family sometimes helps  Lives in: House/apartment  Has following equipment at home: Wheelchair (manual), shower chair, bed side commode, and Stedy. Pt's wife bathes pt in [place] when it is just her, otherwise when family is over, they bring pt to the  shower chair   PLOF: Needs assistance with ADLs and Needs assistance with transfers  PATIENT GOALS: Per wife - wants to work on sitting balance, try to weight bear through L side. Work on moving his hand   OBJECTIVE:   COGNITION: Overall cognitive status: Impaired   SENSATION: Unable to assess due to cognitive deficits, pt not able to follow directions   MUSCLE TONE: LLE: Severe and Hypertonic  POSTURE: rounded shoulders, forward head, increased thoracic kyphosis, posterior pelvic tilt, flexed trunk , weight shift left, and significant trunk shortening on L side, head held in L lateral cervical flexion   Pt currently in standard manual w/c, has other personalized w/c with recline and higher back rest is broken. Pt only with R foot rest and has LLE on R foot rest.   Pt is strapped into w/c with a vest to prevent him from sliding out.    LOWER EXTREMITY ROM:    Significant hypomobility to L hamstring, L ankle DF, tested passively in sitting    LOWER EXTREMITY MMT:   Unable to assess due to cognitive deficits, pt not able to follow directions for MMT testing   Pt needing PT assist to pick up LLE during entirety of session.   BED MOBILITY:  Did not get to assess during eval due to time constraints.   TRANSFERS: Assistive device utilized:  [employer] to stand: Max A Stand to sit: Mod A Pt with significant tightness/rigidity in trunk, unable to actively lean forwards, needs max assist for forward lean and cues for RUE placement on Stedy.  Once in standing pt with significant L>R knee flexion, forward posture with incr trunk flexion, and decr L ankle ROM with L heel unable to be placed on the [employer].  In [place], attempted to cue to look upright for posture with visual cue on mirror, pt unable to follow cues for posture. Attempted to provide additional cue at buttocks for hip extension.   When sitting back down in manual w/c, pt needing mod A for controlled descent  and max cues for hip flexion (with pt unable to lean forwards).   GAIT: Pt is non-ambulatory.   TODAY'S TREATMENT:      Pt transferred from w/c  to mat table using Stedy. Pt mumbling to himself throughout session today. With initial stand from [facility], pt needing verbal/tactile/demo cues for proper RUE placement on farthest [facility] bar, pt able to perform with min A with heavy reliance on RUE to come  to stand. Pt transferred dependently with Stedy over to mat table. Pt needing min/mod A from therapist for eccentric lowering to mat table. Had chair and 2 pillows posteriorly to pt as pt in sitting with incr posterior lean and heavy lean to the L side.   In sitting, had 4 blaze pods set up on a table anteriorly in front of patient, to help try to work on visual scanning, forward lean, seated balance, reaction times. PT initially needing to provide mod A for incr forward lean, decreasing to min A/CGA with incr time in sitting.  Performed 4 sets of 1 minute each: 5 hits, 2 hits, 3 hits, 5 hits with RUE  Pt needing max verbal/tactile/manual cues throughout activity to attend to blaze pod and tap it with hand (with therapist needing to help bring hand to tap), pt with decr ability to attend to L side. After pt would tap one blaze pod, would continuously keep hitting it, despite there being another one that lit up  In sitting, with student volunteer in front of pt and PT providing min A for incr forward lean, performed balloon taps, with pt hitting with RUE. Working on improved posture, sitting tolerance, visual scanning, coordination, pt performed 3 sets of 10-15 reps of hitting the balloon, pt enjoyed this task and was able to stay on task throughout and pt even counted to 10 the number of times he hit it.   At end of session, pt needing mod-max A to pull to stand from [place] from lower mat table (pt needing cues on reaching forward). Pt transferred dependently back to w/c with Stedy and mod A for stand > sit  from [place] for controlled descent.                                         PATIENT EDUCATION: Education details: Continue HEP, working on sitting at home  Person educated: Patient and Spouse Education method: Explanation Education comprehension: verbalized understanding  HOME EXERCISE PROGRAM: -RUE reaching up, laterally, forward, cross body to left knee -Sustained left holds/weight shifts -Passive hamstring stretch propping LE in chair with heels floated -Can attempt LAQ and marching of the RLE -Manual stretch of the right pec for posture -Positioning in chair to prevent worsening posture  GOALS: Goals reviewed with patient? Yes  SHORT TERM GOALS: Target date: 06/13/2023  Pt will perform initial HEP with family assist for improved ROM, posture, strength.  Baseline: Goal status: MET  2.  Sitting balance to be assessed with STG/LTG written.  Baseline: LTG written  Goal status: MET  3.  Pt will perform at least 3 minutes of standing in [place] with min A for improved tolerance/participation in ADLs.  Baseline: pt able to stand ~5 minutes with CGA/min A  Goal status: MET    LONG TERM GOALS: Target date: 07/04/2023  Pt will perform final HEP with family assist for improved ROM, posture, strength.  Baseline:  Goal status: INITIAL  2.  Pt will remain in upright unsupported sitting x2 minutes to improve participation in ADLs and decrease caregiver burden.    Baseline: 1 minute and 27 seconds (6/20) Goal status: INITIAL  3.  Pt will perform sit > stand from w/c to [facility] with mod A in order to decr caregiver burden.  Baseline: max A  Goal status: INITIAL  4.  Pt will perform at least 6 minutes of standing  in [place] with min A for improved tolerance/participation in ADLs.  Baseline:  Goal status: INITIAL   ASSESSMENT:  CLINICAL IMPRESSION: Today's skilled session focused on seated balance on mat table, working on midline positioning and incr anterior lean. With pt initially  sitting on mat table, does have heavy lean to the L and posteriorly (needs chair and 2 pillows propped behind him). Attempted blaze pods on random setting, but pt with difficulty performing this task with sustained attention/unsure if able to follow directions to just tap the pod that lit up. Manual cues from therapist to help bring his hand to the lit up one. Pt did better at seated task of balloon taps with RUE and was able to stay focused to this task throughout to work on sitting balance/endurance.Will continue to progress towards LTGs.   OBJECTIVE IMPAIRMENTS: decreased activity tolerance, decreased balance, decreased cognition, decreased coordination, decreased knowledge of condition, decreased mobility, decreased ROM, decreased strength, hypomobility, impaired flexibility, impaired sensation, impaired tone, impaired UE functional use, impaired vision/preception, and postural dysfunction.   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  Left femoral DVT/Antithrombotics: IVC filter in place, seizures, HTN, dementia   are also affecting patient's functional outcome.   REHAB POTENTIAL: Fair due to time since onset, significant residual spastic hemiplegia and cognitive impairment from CVA in 2022, dementia   CLINICAL DECISION MAKING: Evolving/moderate complexity  EVALUATION COMPLEXITY: Moderate  PLAN:  PT FREQUENCY: 1x/week  PT  DURATION: 8 weeks - only anticipate 6 weeks, due to delay in scheduling   PLANNED INTERVENTIONS: Therapeutic exercises, Therapeutic activity, Neuromuscular re-education, Balance training, Gait training, Patient/Family education, Self Care, Joint mobilization, Manual therapy, and Re-evaluation  PLAN FOR NEXT SESSION: Transfer with stedy to mat table.  Add to HEP as needed for ROM/stretching for pt's wife and other help at home to perform.  Try seated LE engagement w/ soccer ball kicks and 2" step target.  If reaching task patient responds well to high-fives.    Drake Leach, PT, DPT  06/20/2023, 12:10 PM

## 2023-06-27 ENCOUNTER — Encounter: Payer: Self-pay | Admitting: Physical Therapy

## 2023-06-27 ENCOUNTER — Ambulatory Visit: Payer: Commercial Managed Care - PPO | Admitting: Physical Therapy

## 2023-06-27 ENCOUNTER — Ambulatory Visit: Payer: Commercial Managed Care - PPO | Admitting: Occupational Therapy

## 2023-06-27 VITALS — BP 116/70 | HR 64

## 2023-06-27 DIAGNOSIS — R293 Abnormal posture: Secondary | ICD-10-CM

## 2023-06-27 DIAGNOSIS — M6281 Muscle weakness (generalized): Secondary | ICD-10-CM

## 2023-06-27 DIAGNOSIS — M25632 Stiffness of left wrist, not elsewhere classified: Secondary | ICD-10-CM

## 2023-06-27 DIAGNOSIS — M24522 Contracture, left elbow: Secondary | ICD-10-CM

## 2023-06-27 DIAGNOSIS — I69154 Hemiplegia and hemiparesis following nontraumatic intracerebral hemorrhage affecting left non-dominant side: Secondary | ICD-10-CM

## 2023-06-27 DIAGNOSIS — R278 Other lack of coordination: Secondary | ICD-10-CM

## 2023-06-27 NOTE — Therapy (Unsigned)
OUTPATIENT OCCUPATIONAL THERAPY NEURO TREATMENT  Patient Name: Donald [name] MRN: [id number] DOB:09-28-1956, 67 y.o., male Today's Date: 06/27/2023  PCP: [name], MD  REFERRING PROVIDER: Ranelle Oyster, MD  END OF SESSION:   OT End of Session - 06/27/23 1226     Visit Number 6    Number of Visits 7    Date for OT Re-Evaluation 07/07/23    Authorization Type [employer]    Progress Note Due on Visit 7    OT Start Time 1232    OT Stop Time 1315    OT Time Calculation (min) 43 min    Equipment Utilized During Treatment Patient in WC; L hand splint    Activity Tolerance Patient tolerated treatment well   limited by cognition and some confusion today   Behavior During Therapy WFL for tasks assessed/performed              Past Medical History:  Diagnosis Date   Arthritis    Stroke [facility])    Past Surgical History:  Procedure Laterality Date   APPENDECTOMY     IR ANGIO INTRA EXTRACRAN SEL COM CAROTID INNOMINATE BILAT MOD SED  05/28/2021   IR ANGIO VERTEBRAL SEL VERTEBRAL BILAT MOD SED  05/28/2021   IR IVC FILTER PLMT / S&I /IMG GUID/MOD SED  05/27/2021   IR US GUIDE VASC ACCESS RIGHT  05/27/2021   Patient Active Problem List   Diagnosis Date Noted   Cervical dystonia 04/12/2023   Dementia (HCC) 03/29/2022   H/O: CVA (cerebrovascular accident) 03/29/2022   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) 02/09/2022   At high risk for inadequate nutritional intake 07/12/2021   Transaminitis 06/06/2021   Intraparenchymal hematoma of brain (HCC) 06/04/2021   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HCC) 05/30/2021   Right lower lobe pneumonia 05/30/2021   Prediabetes 05/30/2021   Hyperglycemia 05/30/2021   Intracerebral hemorrhage 05/24/2021   Seizure (HCC) 05/23/2021   Malnutrition of moderate degree 05/22/2021   Presence of other vascular implants and grafts 05/16/2021   Memory deficits    OSA (obstructive sleep apnea)    Generalized OA    Hypokalemia     Leukocytosis    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 (HCC) 05/11/2021   Unspecified dementia, unspecified severity, without behavioral disturbance, psychotic disturbance, mood disturbance, and anxiety (HCC) 12/12/2020   Essential (primary) hypertension 12/13/2019   Personal history of other venous thrombosis and embolism 05/27/2019   Annual physical exam 04/03/2017    ONSET DATE:  04/12/2023 (date of referral) CVA was in March 2022  REFERRING DIAG: R42.706 (ICD-10-CM) - 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   THERAPY DIAG:  Stiffness of left wrist, not elsewhere classified  Contracture, elbow, left  Muscle weakness (generalized)  Other lack of coordination  Abnormal posture  Rationale for Evaluation and Treatment: Rehabilitation  SUBJECTIVE:   SUBJECTIVE STATEMENT: Pt's wife reports his blister has been drying up in his webspace.    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  Left femoral DVT/Antithrombotics: IVC filter in place, seizures, HTN, dementia   PRECAUTIONS: Other: significant residual L spastic hemiplegia, cognitive impairment, dementia, condom catheter  WEIGHT BEARING RESTRICTIONS: No  PAIN:  Are you having pain? No  FALLS: Has patient fallen in  last 6 months? No  Lives with: lives with their spouse and family sometimes helps  Lives in: House/apartment   Has following equipment at home: Wheelchair (manual), shower chair, bed side commode, and Stedy. 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   OBJECTIVE:   HAND DOMINANCE: Right  ADLs: Overall ADLs: max to total A Transfers/ambulation related to ADLs: Eating: feeds self with right hand UB Dressing: Max A LB Dressing: total A Toileting: total A occasionally will alert wife he needs to have a BM  Bathing: max A - will wash face mostly when given washcloth  Equipment: bed side commode  IADLs: Dependent  MOBILITY STATUS:  wheelchair bound, requires assistance for propelling  POSTURE COMMENTS:  L lean Sitting balance: Poor  ACTIVITY TOLERANCE: Activity tolerance: poor  FUNCTIONAL OUTCOME MEASURES: PSFS: 3  - 05/23/2023 (eval)   Total score = sum of the activity scores/number of activities Minimum detectable change (90%CI) for average score = 2 points Minimum detectable change (90%CI) for single activity score = 3 points  UPPER EXTREMITY ROM:    RUE: WNL LUE: lacks active movement though does appear to guard with PROM  PROM (hard end feel) Right (eval) Left (eval)  Shoulder flexion WNL 80  Shoulder abduction WNL   Elbow flexion WNL   Elbow extension WNL -90  Wrist flexion WNL   Wrist extension WNL -45  Wrist pronation WNL   Wrist supination WNL    Digit Composite Flexion WNL   Digit Composite Extension WNL WFL; hyperextension PIPs  Digit Opposition WNL   (Blank rows = not tested)  UPPER EXTREMITY MMT:     RUE: WFL LUE: unable to test formally; BFL given lack of AROM though pt guards PROM  HAND FUNCTION: UTA  COORDINATION: UTA  SENSATION: UTA  EDEMA: None reported or observed  MUSCLE TONE: LUE: Severe and Hypertonic  COGNITION: Overall cognitive status: Impaired and not consistently oriented to person, place or time   VISION: Subjective report:  used to wear glasses; L VF cut following CVA  PERCEPTION: Impaired: Inattention/neglect: does not attend to left visual field and does not attend to left side of body and Spatial orientation: requires cues  PRAXIS: Impaired: Initiation and Motor planning  OBSERVATIONS: Pt appears fairly well-kept. In manual w/c with poor positioning, only has R footrest, catheter in place. Pt is strapped into w/c with a vest to prevent him from sliding out.     TODAY'S TREATMENT:                                                                                                                               -  Therapeutic Activities  Patient arrived with hand roll in place and elastic strap across dorsal surface of hand. Blister inside webspace is drying up. Therapist did note a area of irritation on the outside of the web space. Spouse is encouraged to monitor skin to prevent further skin breakdown and hand roll was modified to minimize strap putting pressure in the web space.   Patient engaged in purposeful right upper extremity activities with, hand over hand guidance for tasks, such as folding washcloths and sorting utensils. Patient was able to pick up different utensils off the table and bring them close to the sorting container but he only released one out of 10 items appropriately without physical contact from therapist. Patient would pick up the edge of the washcloth, but required physical guidance to fold and release the washcloth appropriately.   Spouse is encouraged to try to encouraged purposeful activities with familiar objects as able using hand over or hand under hand guidance as previously demonstrated.  - Manual Techniques Patient tolerated stretching and manual techniques well with left upper extremity to move shoulder and elbow away from his body. Elbow continues to remain around 90 of extension and may benefit from an elbow splint in the future. This may be appropriate to consider if patient  receives Botox again.  OT educated pt's spouse on hand under hand technique for dressing and PROM efforts.   PATIENT EDUCATION: Education details: Hand under hand; LUE stretching  Person educated: Patient and Spouse Education method: Medical illustrator Education comprehension: verbalized understanding and needs further education  HOME EXERCISE PROGRAM: 06/21/2023 - hand under hand techniques  GOALS:  SHORT TERM GOALS: Target date: 06/20/2023    Caregiver will demonstrate independence with LUE ROM HEP. Baseline: no set HEP Goal status: IN PROGRESS   LONG TERM GOALS: Target date: 07/07/2023    Patient will report at least two-point increase in average PSFS score or at least three-point increase in a single activity score indicating functionally significant improvement given minimum detectable change.  Baseline: 3 total score (individual activities r/t stretch, dress/bathing) Goal status: IN PROGRESS  2.  Pt will participate in ADLs using adaptive strategies or AD as needed.  Baseline: limited participation Goal status: IN PROGRESS  ASSESSMENT:  CLINICAL IMPRESSION: Pt has difficulty with following verbal directions consistently ie) to sort utensils or complete simple table top activity but allowed hand over hand guidance to work on task with spouse encouraged to facilitate engagement in grasp and purposeful release of objects, ROM to help with dressing and to continue to monitor skin integrity of L hand with splinting.  Patient and spouse benefit from skilled OT to adapt ADL techniques, educated in stretching strategies and monitor for appropriate splint use for max skin integrity and joint ROM.   PERFORMANCE DEFICITS: in functional skills including ADLs, IADLs, coordination, tone, ROM, strength, pain, Fine motor control, Gross motor control, mobility, body mechanics, decreased knowledge of use of DME, vision, UE functional use, and cognition  IMPAIRMENTS: are limiting  patient from ADLs, IADLs, work, and leisure.   CO-MORBIDITIES: has co-morbidities such as dementia/impaired cognitive status  that affects occupational performance. Patient will benefit from skilled OT to address above impairments and improve overall function.  REHAB POTENTIAL: Fair given chronicity of stroke and pt's cognition  PLAN:  OT FREQUENCY: 1x/week  OT DURATION: 6 weeks  PLANNED INTERVENTIONS: self care/ADL training, therapeutic exercise, therapeutic activity, manual therapy, passive range of motion, moist heat, patient/family education, cognitive remediation/compensation, visual/perceptual remediation/compensation, DME and/or AE  instructions, and Re-evaluation  RECOMMENDED OTHER SERVICES: none at this time  CONSULTED AND AGREED WITH PLAN OF CARE:  pt's spouse  PLAN FOR NEXT SESSION:   DC visit vs Recert.  Explore possible LUE elbow positioning/ splint  Assist with L hand splint/hand roll as needed.   Review: LUE PROM HEP; ADLs - hand under hand technique   Victorino Sparrow, OT 06/27/2023, 2:16 PM

## 2023-06-27 NOTE — Therapy (Signed)
OUTPATIENT PHYSICAL THERAPY NEURO TREATMENT   Patient Name: Donald [name] MRN: [id number] DOB:Nov 13, 1956, 67 y.o., male Today's Date: 06/27/2023   PCP: [name], MD REFERRING PROVIDER: Ranelle Oyster, MD  END OF SESSION:  PT End of Session - 06/27/23 1152     Visit Number 6    Number of Visits 7    Date for PT Re-Evaluation 07/22/23    Authorization Type UHC    PT Start Time 1148    PT Stop Time 1230    PT Time Calculation (min) 42 min    Equipment Utilized During Treatment Gait belt    Activity Tolerance Patient tolerated treatment well    Behavior During Therapy WFL for tasks assessed/performed;Flat affect              Past Medical History:  Diagnosis Date   Arthritis    Stroke [facility])    Past Surgical History:  Procedure Laterality Date   APPENDECTOMY     IR ANGIO INTRA EXTRACRAN SEL COM CAROTID INNOMINATE BILAT MOD SED  05/28/2021   IR ANGIO VERTEBRAL SEL VERTEBRAL BILAT MOD SED  05/28/2021   IR IVC FILTER PLMT / S&I /IMG GUID/MOD SED  05/27/2021   IR US GUIDE VASC ACCESS RIGHT  05/27/2021   Patient Active Problem List   Diagnosis Date Noted   Cervical dystonia 04/12/2023   Dementia (HCC) 03/29/2022   H/O: CVA (cerebrovascular accident) 03/29/2022   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) 02/09/2022   At high risk for inadequate nutritional intake 07/12/2021   Transaminitis 06/06/2021   Intraparenchymal hematoma of brain (HCC) 06/04/2021   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HCC) 05/30/2021   Right lower lobe pneumonia 05/30/2021   Prediabetes 05/30/2021   Hyperglycemia 05/30/2021   Intracerebral hemorrhage 05/24/2021   Seizure (HCC) 05/23/2021   Malnutrition of moderate degree 05/22/2021   Presence of other vascular implants and grafts 05/16/2021   Memory deficits    OSA (obstructive sleep apnea)    Generalized OA    Hypokalemia    Leukocytosis    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 (HCC) 05/11/2021   Unspecified  dementia, unspecified severity, without behavioral disturbance, psychotic disturbance, mood disturbance, and anxiety (HCC) 12/12/2020   Essential (primary) hypertension 12/13/2019   Personal history of other venous thrombosis and embolism 05/27/2019   Annual physical exam 04/03/2017    ONSET DATE: 04/12/2023 (date of referral)  REFERRING DIAG: I69.354 (ICD-10-CM) - 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 (HCC)   THERAPY DIAG:  Muscle weakness (generalized)  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)  Abnormal posture  Rationale for Evaluation and Treatment: Rehabilitation  SUBJECTIVE:  SUBJECTIVE STATEMENT: Has been working on exercises as able at home.   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  Left femoral DVT/Antithrombotics: IVC filter in place, seizures, HTN, dementia   PAIN:  Are you having pain? No  Vitals:   06/27/23 1230  BP: 116/70  Pulse: 64      PRECAUTIONS: Other: significant residual L spastic hemiplegia, cognitive impairment, dementia, condom catheter    WEIGHT BEARING RESTRICTIONS: No  FALLS: Has patient fallen in last 6 months? No  LIVING ENVIRONMENT: Lives with: lives with their spouse and family sometimes helps  Lives in: House/apartment  Has following equipment at home: Wheelchair (manual), shower chair, bed side commode, and Stedy. Pt's wife bathes pt in [employer] when it is just her, otherwise when family is over, they bring pt to the shower chair   PLOF: Needs assistance with ADLs and Needs  assistance with transfers  PATIENT GOALS: Per wife - wants to work on sitting balance, try to weight bear through L side. Work on moving his hand   OBJECTIVE:   COGNITION: Overall cognitive status: Impaired   SENSATION: Unable to assess due to cognitive deficits, pt not able to follow directions   MUSCLE TONE: LLE: Severe and Hypertonic  POSTURE: rounded shoulders, forward head, increased thoracic kyphosis, posterior pelvic tilt, flexed trunk , weight shift left, and significant trunk shortening on L side, head held in L lateral cervical flexion   Pt currently in standard manual w/c, has other personalized w/c with recline and higher back rest is broken. Pt only with R foot rest and has LLE on R foot rest.   Pt is strapped into w/c with a vest to prevent him from sliding out.    LOWER EXTREMITY ROM:    Significant hypomobility to L hamstring, L ankle DF, tested passively in sitting    LOWER EXTREMITY MMT:   Unable to assess due to cognitive deficits, pt not able to follow directions for MMT testing   Pt needing PT assist to pick up LLE during entirety of session.   BED MOBILITY:  Did not get to assess during eval due to time constraints.   TRANSFERS: Assistive device utilized:  [employer] to stand: Max A Stand to sit: Mod A Pt with significant tightness/rigidity in trunk, unable to actively lean forwards, needs max assist for forward lean and cues for RUE placement on Stedy.  Once in standing pt with significant L>R knee flexion, forward posture with incr trunk flexion, and decr L ankle ROM with L heel unable to be placed on the [employer].  In [place], attempted to cue to look upright for posture with visual cue on mirror, pt unable to follow cues for posture. Attempted to provide additional cue at buttocks for hip extension.   When sitting back down in manual w/c, pt needing mod A for controlled descent and max cues for hip flexion (with pt unable to lean  forwards).   GAIT: Pt is non-ambulatory.   TODAY'S TREATMENT:      Pt transferred from w/c to mat table using Stedy. Pt mumbling to himself throughout session today. With initial stand from [place], pt needing max verbal/tactile/demo cues for proper RUE placement on farthest [facility] bar, pt able to perform sit > stand with mod A with heavy reliance on RUE to come to stand. Pt  transferred dependently with Stedy over to mat table.  Had pt stay standing in Stedy to work on upright tolerance/posture. Pt remains in a half perch position on Stedy pads. Had mirror in front of pt as visual cue, with cues to look up to mirror, despite max cues, pt unable to look up for visual cue. Pt with heavy lean to the L. Tried other visual cues such as cards for attention/looking upright, asking pt number on card (pt unable to accurately give answer) then attempted having pt name the color (pt only able to name the color brown). Attempted colorful balance discs in front of pt and then trying to raise them overhead for posture with cued for pt to name the color, pt only able to state brown. Attempted sit > stand from [place] pads with PT needing to provide total A to move feet back in proper position in [place] as pt's L foot tending to lift up. Pt needing max A to try to come to stand, with pt's buttocks barely lifting up from Stedy pads, pt unable to hold him up in standing, only held for 5 seconds, attempted 5 reps with pt unable to initiate partial standing on his own.   Pt then sitting down from [place]. Pt needing mod A from therapist for eccentric lowering to mat table. Had chair and 2 pillows posteriorly to pt as pt in sitting with incr posterior lean and heavy lean to the L side. Pt closing his eyes throughout sitting and standing today and with decr attention, PT providing max A for forward lean with PT holding out her hand in front of pt with asking pt to lean forward and tap therapist's hand. PT needing max cues and pt  taking incr time to perform and pt easily distracted. Performed approx. 7 reps of pt leaning forwards  At end of session, pt needing mod-max A to pull to stand from [place] from lower mat table (pt needing max multi-modal cues on reaching forward). Pt transferred dependently back to w/c with Stedy and mod A for stand > sit from [place] for controlled descent. Pt's spouse helped tip pt's wheelchair back for improved positioning in chair. Assessed pt's BP at end of session due to pt closing his eyes more and being more tired today, BP WNL (see above).                                          PATIENT EDUCATION: Education details: Continue HEP, working on sitting at home  Person educated: Patient and Spouse Education method: Explanation Education comprehension: verbalized understanding  HOME EXERCISE PROGRAM: -RUE reaching up, laterally, forward, cross body to left knee -Sustained left holds/weight shifts -Passive hamstring stretch propping LE in chair with heels floated -Can attempt LAQ and marching of the RLE -Manual stretch of the right pec for posture -Positioning in chair to prevent worsening posture  GOALS: Goals reviewed with patient? Yes  SHORT TERM GOALS: Target date: 06/13/2023  Pt will perform initial HEP with family assist for improved ROM, posture, strength.  Baseline: Goal status: MET  2.  Sitting balance to be assessed with STG/LTG written.  Baseline: LTG written  Goal status: MET  3.  Pt will perform at least 3 minutes of standing in [place] with min A for improved tolerance/participation in ADLs.  Baseline: pt able to stand ~5 minutes with CGA/min A  Goal status:  MET    LONG TERM GOALS: Target date: 07/04/2023  Pt will perform final HEP with family assist for improved ROM, posture, strength.  Baseline:  Goal status: INITIAL  2.  Pt will remain in upright unsupported sitting x2 minutes to improve participation in ADLs and decrease caregiver burden.    Baseline: 1  minute and 27 seconds (6/20) Goal status: INITIAL  3.  Pt will perform sit > stand from w/c to [facility] with mod A in order to decr caregiver burden.  Baseline: max A  Goal status: INITIAL  4.  Pt will perform at least 6 minutes of standing in [place] with min A for improved tolerance/participation in ADLs.  Baseline:  Goal status: INITIAL   ASSESSMENT:  CLINICAL IMPRESSION: Attempted to work on partial sit > stands from [place] pads, but pt unable to initiate standing, with PT needing to provide max A to even attempt a partial stand. Pt more tired today and closing his eyes throughout session and decr attention compared to previous session. Assessed BP and was WNL. With handoff with OT, pt appearing more alert and his eyes were more open. Will continue to progress towards LTGs.   OBJECTIVE IMPAIRMENTS: decreased activity tolerance, decreased balance, decreased cognition, decreased coordination, decreased knowledge of condition, decreased mobility, decreased ROM, decreased strength, hypomobility, impaired flexibility, impaired sensation, impaired tone, impaired UE functional use, impaired vision/preception, and postural dysfunction.   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  Left femoral DVT/Antithrombotics: IVC filter in place, seizures, HTN, dementia   are also affecting patient's functional outcome.   REHAB POTENTIAL: Fair due  to time since onset, significant residual spastic hemiplegia and cognitive impairment from CVA in 2022, dementia   CLINICAL DECISION MAKING: Evolving/moderate complexity  EVALUATION COMPLEXITY: Moderate  PLAN:  PT FREQUENCY: 1x/week  PT DURATION: 8 weeks - only anticipate 6 weeks, due to delay in scheduling   PLANNED INTERVENTIONS: Therapeutic exercises, Therapeutic activity, Neuromuscular re-education, Balance training, Gait training, Patient/Family education, Self Care, Joint mobilization, Manual therapy, and Re-evaluation  PLAN FOR NEXT SESSION: check goals and D/CA   Transfer with stedy to mat table.  Add to HEP as needed for ROM/stretching for pt's wife and other help at home to perform.  Try seated LE engagement w/ soccer ball kicks and 2" step target.  If reaching task patient responds well to high-fives.    Drake Leach, PT, DPT  06/27/2023, 1:14 PM

## 2023-07-03 ENCOUNTER — Ambulatory Visit (INDEPENDENT_AMBULATORY_CARE_PROVIDER_SITE_OTHER): Payer: Commercial Managed Care - PPO | Admitting: Family Medicine

## 2023-07-03 VITALS — BP 118/60 | HR 71 | Temp 97.9°F

## 2023-07-03 DIAGNOSIS — Z0000 Encounter for general adult medical examination without abnormal findings: Secondary | ICD-10-CM

## 2023-07-03 DIAGNOSIS — I10 Essential (primary) hypertension: Secondary | ICD-10-CM | POA: Diagnosis not present

## 2023-07-03 DIAGNOSIS — R7303 Prediabetes: Secondary | ICD-10-CM

## 2023-07-03 DIAGNOSIS — Z87898 Personal history of other specified conditions: Secondary | ICD-10-CM

## 2023-07-03 DIAGNOSIS — I69152 Hemiplegia and hemiparesis following nontraumatic intracerebral hemorrhage affecting left dominant side: Secondary | ICD-10-CM | POA: Diagnosis not present

## 2023-07-03 DIAGNOSIS — S06310S Contusion and laceration of right cerebrum without loss of consciousness, sequela: Secondary | ICD-10-CM

## 2023-07-03 DIAGNOSIS — R82998 Other abnormal findings in urine: Secondary | ICD-10-CM

## 2023-07-03 DIAGNOSIS — Z1211 Encounter for screening for malignant neoplasm of colon: Secondary | ICD-10-CM

## 2023-07-03 DIAGNOSIS — Z125 Encounter for screening for malignant neoplasm of prostate: Secondary | ICD-10-CM

## 2023-07-03 LAB — POCT URINALYSIS DIPSTICK
Bilirubin, UA: NEGATIVE
Blood, UA: NEGATIVE
Glucose, UA: NEGATIVE
Ketones, UA: NEGATIVE
Nitrite, UA: POSITIVE
Protein, UA: NEGATIVE
Spec Grav, UA: 1.015 (ref 1.010–1.025)
Urobilinogen, UA: 0.2 E.U./dL
pH, UA: 7 (ref 5.0–8.0)

## 2023-07-03 NOTE — Patient Instructions (Addendum)
I will check labs, including urine test, but dark urine likely related to decreased fluid intake.  I would consider meeting with eye specialist and dentist.   I do recommend flu, covid booster and option of RSV vaccine- those are available at your pharmacy.   Thank you for coming in today and please let me know if there are questions.  See recommendations based on age below.  I will refer you to gastroenterologist as we discussed.  Take care  There is a recommendation for RSV vaccine for patients over age 43.   Typically would recommend the RSV vaccine as most important for patients over age 47 that have comorbidities that put them at increased risk for severe disease (heart disease such as congestive heart failure, coronary artery disease, lung disease such as asthma or COPD, kidney disease, liver disease, diabetes, chronic or progressive neurologic or muscular conditions, immunosuppressed, or being frail or of advanced age).  For others that do not have these risk factors, there still is some benefit from vaccination since age is one of the main risk factors for developing severe disease however baseline risk of developing severe disease and requiring hospitalization is likely to be lower compared to those that have comorbidities in addition to age.   CDC does have some information as well: [employer].fi  Preventive Care 17 Years and Older, Male Preventive care refers to lifestyle choices and visits with your health care provider that can promote health and wellness. Preventive care visits are also called wellness exams. What can I expect for my preventive care visit? Counseling During your preventive care visit, your health care provider may ask about your: Medical history, including: Past medical problems. Family medical history. History of falls. Current health, including: Emotional well-being. Home life and relationship well-being. Sexual  activity. Memory and ability to understand (cognition). Lifestyle, including: Alcohol, nicotine or tobacco, and drug use. Access to firearms. Diet, exercise, and sleep habits. Work and work Astronomer. Sunscreen use. Safety issues such as seatbelt and bike helmet use. Physical exam Your health care provider will check your: Height and weight. These may be used to calculate your BMI (body mass index). BMI is a measurement that tells if you are at a healthy weight. Waist circumference. This measures the distance around your waistline. This measurement also tells if you are at a healthy weight and may help predict your risk of certain diseases, such as type 2 diabetes and high blood pressure. Heart rate and blood pressure. Body temperature. Skin for abnormal spots. What immunizations do I need?  Vaccines are usually given at various ages, according to a schedule. Your health care provider will recommend vaccines for you based on your age, medical history, and lifestyle or other factors, such as travel or where you work. What tests do I need? Screening Your health care provider may recommend screening tests for certain conditions. This may include: Lipid and cholesterol levels. Diabetes screening. This is done by checking your blood sugar (glucose) after you have not eaten for a while (fasting). Hepatitis C test. Hepatitis B test. HIV (human immunodeficiency virus) test. STI (sexually transmitted infection) testing, if you are at risk. Lung cancer screening. Colorectal cancer screening. Prostate cancer screening. Abdominal aortic aneurysm (AAA) screening. You may need this if you are a current or former smoker. Talk with your health care provider about your test results, treatment options, and if necessary, the need for more tests. Follow these instructions at home: Eating and drinking  Eat a diet that includes  fresh fruits and vegetables, whole grains, lean protein, and low-fat  dairy products. Limit your intake of foods with high amounts of sugar, saturated fats, and salt. Take vitamin and mineral supplements as recommended by your health care provider. Do not drink alcohol if your health care provider tells you not to drink. If you drink alcohol: Limit how much you have to 0-2 drinks a day. Know how much alcohol is in your drink. In the [place] one drink equals one 12 oz bottle of beer (355 mL), one 5 oz glass of wine (148 mL), or one 1 oz glass of hard liquor (44 mL). Lifestyle Brush your teeth every morning and night with fluoride toothpaste. Floss one time each day. Exercise for at least 30 minutes 5 or more days each week. Do not use any products that contain nicotine or tobacco. These products include cigarettes, chewing tobacco, and vaping devices, such as e-cigarettes. If you need help quitting, ask your health care provider. Do not use drugs. If you are sexually active, practice safe sex. Use a condom or other form of protection to prevent STIs. Take aspirin only as told by your health care provider. Make sure that you understand how much to take and what form to take. Work with your health care provider to find out whether it is safe and beneficial for you to take aspirin daily. Ask your health care provider if you need to take a cholesterol-lowering medicine (statin). Find healthy ways to manage stress, such as: Meditation, yoga, or listening to music. Journaling. Talking to a trusted person. Spending time with friends and family. Safety Always wear your seat belt while driving or riding in a vehicle. Do not drive: If you have been drinking alcohol. Do not ride with someone who has been drinking. When you are tired or distracted. While texting. If you have been using any mind-altering substances or drugs. Wear a helmet and other protective equipment during sports activities. If you have firearms in your house, make sure you follow all gun safety  procedures. Minimize exposure to UV radiation to reduce your risk of skin cancer. What's next? Visit your health care provider once a year for an annual wellness visit. Ask your health care provider how often you should have your eyes and teeth checked. Stay up to date on all vaccines. This information is not intended to replace advice given to you by your health care provider. Make sure you discuss any questions you have with your health care provider. Document Revised: 05/26/2021 Document Reviewed: 05/26/2021 Elsevier Patient Education  2024 [employer].

## 2023-07-03 NOTE — Progress Notes (Signed)
Subjective:  Patient ID: Donald [name], male    DOB: May 16, 1956  Age: 67 y.o. MRN: [id number]  CC:  Chief Complaint  Patient presents with   Annual Exam    Not fasting 12 last meal, would like urine checked as well, no noted sxs but does have a slightly darker appearance to urine last few weeks     HPI Donald [name] presents for Annual Exam  Physical and above concern.   PCP, me PMR, Dr. Riley [name], hemiplegia following cerebral infarction, cervical dystonia, spasticity.  Office visit in May, undergoing PT, OT treatment, and has received Botox treatment for spasticity. Neurology, Dr. Pearlean [name], as above with hemiplegia following cerebral infarction, intracerebral hemorrhage, and partial seizures.  Treated with Vimpat, Keppra, goal BP below 130/90.  1 year follow-up planned in November 2023. Cardiology, Dr. Elease [name], hypertension.   Hypertension: Amlodipine 2.5 mg daily - only taking as need for elevated BP (140/90). Last taken months ago. No recent need.  Home readings: BP Readings from Last 3 Encounters:  07/03/23 118/60  06/27/23 116/70  05/23/23 (!) 138/50   Lab Results  Component Value Date   CREATININE 0.96 12/07/2022   Dark Urine: Spouse noted if lower fluid intake during the day.  Better when at home, more dark urine at adult daycare.  No fever, abd pain, uses condom cath, no new symptoms.  No hematuria.      07/03/2023    3:13 PM 12/09/2022    9:18 AM 11/23/2022    3:39 PM 02/09/2022   11:40 AM 09/08/2021    1:35 PM  Depression screen PHQ 2/9  Decreased Interest 0 0 0 0 0  Down, Depressed, Hopeless 0 0 0 0 0  PHQ - 2 Score 0 0 0 0 0  Altered sleeping 1 0   0  Tired, decreased energy 1 1   0  Change in appetite 0 0   0  Feeling bad or failure about yourself  0 0   0  Trouble concentrating 0 1   0  Moving slowly or fidgety/restless 0 0   0  Suicidal thoughts 0 0     PHQ-9 Score 2 2   0    Health Maintenance  Topic Date Due   Zoster Vaccines- Shingrix (1 of  2) Never done   Colonoscopy  06/19/2021   COVID-19 Vaccine (1 - 2023-24 season) Never done   Pneumonia Vaccine 82+ Years old (1 of 1 - PCV) 12/10/2023 (Originally 12/16/2021)   INFLUENZA VACCINE  07/13/2023   DTaP/Tdap/Td (2 - Td or Tdap) 11/09/2030   Hepatitis C Screening  Completed   HPV VACCINES  Aged Out  Colonoscopy July 2021, repeat recommended in 6 months - had stroke in 2022. Agrees to repeat eval with GI.  Prostate: The natural history of prostate cancer and ongoing controversy regarding screening and potential treatment outcomes of prostate cancer has been discussed with the patient. The meaning of a false positive PSA and a false negative PSA has been discussed. He indicates understanding of the limitations of this screening test and wishes  to proceed with screening PSA testing. Lab Results  Component Value Date   PSA1 4.7 (H) 10/28/2019   PSA1 2.2 04/03/2017   PSA 3.8 04/29/2020   PSA 2.2 05/03/2018   PSA 2.08 04/15/2016    Immunization History  Administered Date(s) Administered   Hepatitis A, Adult 04/28/2015   Influenza,inj,Quad PF,6+ Mos 11/09/2020   Tdap 11/09/2020  Flu vaccine recommended.  Declines covid booster at  this  Shingrix declined.  Option of RSV vaccine discussed.   No results found. Dental: no recent dental or optho eval - recommended.   Alcohol:none  Tobacco: none  Exercise: wheelchair bound, requires assistance of Steady device.  History of prediabetes, due for updated testing.   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   Right lower lobe pneumonia 05/30/2021   Prediabetes 05/30/2021   Hyperglycemia 05/30/2021   Intracerebral hemorrhage 05/24/2021   Seizure (HCC) 05/23/2021   Malnutrition of moderate degree 05/22/2021   Presence of other vascular implants and grafts 05/16/2021   Memory deficits    OSA (obstructive sleep apnea)    Generalized OA    Hypokalemia    Leukocytosis    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 (HCC) 05/11/2021   Unspecified dementia, unspecified severity, without behavioral disturbance, psychotic disturbance, mood disturbance, and anxiety (HCC) 12/12/2020   Essential (primary) hypertension 12/13/2019   Personal history of other venous thrombosis and embolism 05/27/2019   Annual physical exam 04/03/2017   Past Medical History:  Diagnosis Date   Arthritis    Stroke [name]    Past Surgical History:  Procedure Laterality Date   APPENDECTOMY     IR ANGIO INTRA EXTRACRAN SEL COM CAROTID INNOMINATE BILAT MOD SED  05/28/2021   IR ANGIO VERTEBRAL SEL VERTEBRAL BILAT MOD SED  05/28/2021   IR IVC FILTER PLMT / S&I /IMG GUID/MOD SED  05/27/2021   IR US GUIDE VASC ACCESS RIGHT  05/27/2021   Allergies  Allergen Reactions   Sulfa Antibiotics Other (See Comments)    Nausea and eye swelling    Zanaflex [Tizanidine]     lethargy   Prior to Admission medications   Medication Sig Start Date End Date Taking? Authorizing Provider  amLODipine (NORVASC) 2.5 MG tablet Take 1 tablet (2.5 mg total) by mouth daily. 12/09/22  Yes [name], MD  Lacosamide 100 MG TABS Take 1 tablet (100 mg total) by mouth 2 (two) times daily. 05/23/23  Yes Penumalli, Glenford Bayley, MD  levETIRAcetam (KEPPRA) 100 MG/ML solution Take 2.5 mLs (250 mg total) by mouth 2 (two) times daily. 02/22/23 02/17/24 Yes Micki Riley, MD  Multiple Vitamin  (MULTIVITAMIN) tablet Take 1 tablet by mouth daily.   Yes [provider]  nitroGLYCERIN (NITROSTAT) 0.4 MG SL tablet Dissolve 1 tablet under the tongue every 5 minutes as needed for chest pain. Max of 3 doses, then 911. 01/04/23  Yes Nahser, Deloris Ping, MD  levETIRAcetam (KEPPRA) 100 MG/ML solution Take 2.5 mLs (250 mg total) by mouth 2 (two) times daily. 12/06/22 03/09/23  Micki Riley, MD   Social History   Socioeconomic History   Marital status: Married    Spouse name: Not on file   Number of children: Not on file   Years of education: Not on file   Highest education level: Not  on file  Occupational History   Not on file  Tobacco Use   Smoking status: Never   Smokeless tobacco: Never  Vaping Use   Vaping status: Never Used  Substance and Sexual Activity   Alcohol use: No   Drug use: No   Sexual activity: Yes    Birth control/protection: None  Other Topics Concern   Not on file  Social History Narrative   Not on file   Social Determinants of Health   Financial Resource Strain: Not on file  Food Insecurity: Not on file  Transportation Needs: Not on file  Physical Activity: Not on file  Stress: Not on file  Social Connections: Unknown (04/26/2022)   [facility] from [facility]   Social Network    Social Network: Not on file  Intimate Partner Violence: Unknown (03/18/2022)   Received from [facility]   HITS    Physically Hurt: Not on file    Insult or Talk Down To: Not on file    Threaten Physical Harm: Not on file    Scream or Curse: Not on file    Review of Systems Per HPI   Objective:   Vitals:   07/03/23 1513  BP: 118/60  Pulse: 71  Temp: 97.9 F (36.6 C)  TempSrc: Temporal  SpO2: 99%     Physical Exam Vitals reviewed.  Constitutional:      Appearance: He is well-developed.     Comments:   Soft-spoken, does respond to questions,   No distress.  HENT:     Head: Normocephalic and atraumatic.     Right Ear: External ear normal.     Left  Ear: External ear normal.  Eyes:     Conjunctiva/sclera: Conjunctivae normal.     Pupils: Pupils are equal, round, and reactive to light.  Neck:     Thyroid: No thyromegaly.  Cardiovascular:     Rate and Rhythm: Normal rate and regular rhythm.     Heart sounds: Normal heart sounds.  Pulmonary:     Effort: Pulmonary effort is normal. No respiratory distress.     Breath sounds: Normal breath sounds. No wheezing.  Abdominal:     General: There is no distension.     Palpations: Abdomen is soft.     Tenderness: There is no abdominal tenderness. There is no right CVA tenderness, left CVA tenderness, guarding or rebound.  Musculoskeletal:        General: No tenderness.     Right lower leg: No edema.     Left lower leg: No edema.     Comments: Examined in wheelchair, no appreciable focal tenderness  Lymphadenopathy:     Cervical: No cervical adenopathy.  Skin:    General: Skin is warm and dry.  Neurological:     Mental Status: He is oriented to person, place, and time.     Deep Tendon Reflexes: Reflexes are normal and symmetric.  Psychiatric:        Behavior: Behavior normal.        Assessment & Plan:  Donald [name] is a 67 y.o. male . Annual physical exam - Plan: CBC with Differential/Platelet, Comprehensive metabolic panel, Lipid panel   --anticipatory guidance as below in AVS, screening labs above. Health maintenance items as above in HPI discussed/recommended as applicable.   Dark urine - Plan: POCT Urinalysis Dipstick, Urine Culture, POCT urinalysis dipstick  -Without other associated urinary tract infection symptoms.  Urine culture without definitive infection, hold on antibiotics for now with RTC precautions  if new symptoms.  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  Check monitoring labs, continue follow-up with specialists.  Lab results were reviewed with hyperlipidemia and given his neurologic history he may benefit from statin.  Message sent to patient and if okay with this med will start low-dose statin with recheck labs in 6 weeks.  Hypertension, unspecified type - Plan: Comprehensive metabolic panel, Lipid panel  -Stable, no med changes at this time. Screening for prostate cancer - Plan: PSA  Screen for colon cancer - Plan: Ambulatory referral to Gastroenterology  -Refer to GI to discuss need for colon cancer screening and interval.  Prediabetes - Plan: Hemoglobin A1c, CANCELED: Hemoglobin A1c  -Stable on repeat testing, borderline prediabetes, no med changes at this time.  No orders of the defined types were placed in this encounter.  Patient Instructions  I will check labs, including urine test, but dark urine likely related to decreased fluid intake.  I would consider meeting with eye specialist and dentist.   I do recommend flu, covid booster and option of RSV vaccine- those are available at your pharmacy.   Thank you for coming in today and please let me know if there are questions.  See recommendations based on age below.  I will refer you to gastroenterologist as we discussed.  Take care  There is a recommendation for RSV vaccine for patients over age 59.   Typically would recommend the RSV vaccine as most important for patients over age 43 that have comorbidities that put them at increased risk for severe disease (heart disease such as congestive heart failure, coronary artery disease, lung disease such as asthma or COPD, kidney disease, liver disease, diabetes, chronic or progressive neurologic or muscular conditions, immunosuppressed, or being frail or of advanced age).  For others that do not have these risk  factors, there still is some benefit from vaccination since age is one of the main risk factors for developing severe disease however baseline risk of developing severe disease and requiring hospitalization is likely to be lower compared to those that have comorbidities in addition to age.   CDC does have some information as well: [employer]  Preventive Care 86 Years and Older, Male Preventive care refers to lifestyle choices and visits with your health care provider that can promote health and wellness. Preventive care visits are also called wellness exams. What can I expect for my preventive care visit? Counseling During your preventive care visit, your health care provider may ask about your: Medical history, including: Past medical problems. Family medical history. History of falls. Current health, including: Emotional well-being. Home life and relationship well-being. Sexual activity. Memory and ability to understand (cognition). Lifestyle, including: Alcohol, nicotine or tobacco, and drug use. Access to firearms. Diet, exercise, and sleep habits. Work and work Astronomer. Sunscreen use. Safety issues such as seatbelt and bike helmet use. Physical exam Your health care provider will check your: Height and weight. These may be used to calculate your BMI (body mass index). BMI is a measurement that tells if you are at a healthy weight. Waist circumference. This measures the distance around your waistline. This measurement also tells if you are at a healthy weight and may help predict your risk of certain  diseases, such as type 2 diabetes and high blood pressure. Heart rate and blood pressure. Body temperature. Skin for abnormal spots. What immunizations do I need?  Vaccines are usually given at various ages, according to a schedule. Your health care provider will recommend vaccines for you based on your age, medical history, and  lifestyle or other factors, such as travel or where you work. What tests do I need? Screening Your health care provider may recommend screening tests for certain conditions. This may include: Lipid and cholesterol levels. Diabetes screening. This is done by checking your blood sugar (glucose) after you have not eaten for a while (fasting). Hepatitis C test. Hepatitis B test. HIV (human immunodeficiency virus) test. STI (sexually transmitted infection) testing, if you are at risk. Lung cancer screening. Colorectal cancer screening. Prostate cancer screening. Abdominal aortic aneurysm (AAA) screening. You may need this if you are a current or former smoker. Talk with your health care provider about your test results, treatment options, and if necessary, the need for more tests. Follow these instructions at home: Eating and drinking  Eat a diet that includes fresh fruits and vegetables, whole grains, lean protein, and low-fat dairy products. Limit your intake of foods with high amounts of sugar, saturated fats, and salt. Take vitamin and mineral supplements as recommended by your health care provider. Do not drink alcohol if your health care provider tells you not to drink. If you drink alcohol: Limit how much you have to 0-2 drinks a day. Know how much alcohol is in your drink. In the [place] one drink equals one 12 oz bottle of beer (355 mL), one 5 oz glass of wine (148 mL), or one 1 oz glass of hard liquor (44 mL). Lifestyle Brush your teeth every morning and night with fluoride toothpaste. Floss one time each day. Exercise for at least 30 minutes 5 or more days each week. Do not use any products that contain nicotine or tobacco. These products include cigarettes, chewing tobacco, and vaping devices, such as e-cigarettes. If you need help quitting, ask your health care provider. Do not use drugs. If you are sexually active, practice safe sex. Use a condom or other form of protection to  prevent STIs. Take aspirin only as told by your health care provider. Make sure that you understand how much to take and what form to take. Work with your health care provider to find out whether it is safe and beneficial for you to take aspirin daily. Ask your health care provider if you need to take a cholesterol-lowering medicine (statin). Find healthy ways to manage stress, such as: Meditation, yoga, or listening to music. Journaling. Talking to a trusted person. Spending time with friends and family. Safety Always wear your seat belt while driving or riding in a vehicle. Do not drive: If you have been drinking alcohol. Do not ride with someone who has been drinking. When you are tired or distracted. While texting. If you have been using any mind-altering substances or drugs. Wear a helmet and other protective equipment during sports activities. If you have firearms in your house, make sure you follow all gun safety procedures. Minimize exposure to UV radiation to reduce your risk of skin cancer. What's next? Visit your health care provider once a year for an annual wellness visit. Ask your health care provider how often you should have your eyes and teeth checked. Stay up to date on all vaccines. This information is not intended to replace advice given to you  by your health care provider. Make sure you discuss any questions you have with your health care provider. Document Revised: 05/26/2021 Document Reviewed: 05/26/2021 Elsevier Patient Education  2024 [employer]      Signed,   Meredith Staggers, MD [facility], [facility] 07/03/23 4:19 PM

## 2023-07-04 ENCOUNTER — Ambulatory Visit: Payer: Commercial Managed Care - PPO | Admitting: Occupational Therapy

## 2023-07-04 ENCOUNTER — Encounter: Payer: Self-pay | Admitting: Physical Therapy

## 2023-07-04 ENCOUNTER — Ambulatory Visit: Payer: Commercial Managed Care - PPO | Admitting: Physical Therapy

## 2023-07-04 DIAGNOSIS — R293 Abnormal posture: Secondary | ICD-10-CM

## 2023-07-04 DIAGNOSIS — M24542 Contracture, left hand: Secondary | ICD-10-CM

## 2023-07-04 DIAGNOSIS — R278 Other lack of coordination: Secondary | ICD-10-CM

## 2023-07-04 DIAGNOSIS — I69154 Hemiplegia and hemiparesis following nontraumatic intracerebral hemorrhage affecting left non-dominant side: Secondary | ICD-10-CM

## 2023-07-04 DIAGNOSIS — M6281 Muscle weakness (generalized): Secondary | ICD-10-CM | POA: Diagnosis not present

## 2023-07-04 DIAGNOSIS — M25632 Stiffness of left wrist, not elsewhere classified: Secondary | ICD-10-CM

## 2023-07-04 DIAGNOSIS — M25612 Stiffness of left shoulder, not elsewhere classified: Secondary | ICD-10-CM

## 2023-07-04 DIAGNOSIS — M24522 Contracture, left elbow: Secondary | ICD-10-CM

## 2023-07-04 DIAGNOSIS — R41841 Cognitive communication deficit: Secondary | ICD-10-CM

## 2023-07-04 LAB — COMPREHENSIVE METABOLIC PANEL
AG Ratio: 1.2 (calc) (ref 1.0–2.5)
ALT: 18 U/L (ref 9–46)
AST: 18 U/L (ref 10–35)
Albumin: 4.1 g/dL (ref 3.6–5.1)
Alkaline phosphatase (APISO): 119 U/L (ref 35–144)
BUN: 12 mg/dL (ref 7–25)
CO2: 25 mmol/L (ref 20–32)
Calcium: 9.7 mg/dL (ref 8.6–10.3)
Chloride: 105 mmol/L (ref 98–110)
Creat: 0.89 mg/dL (ref 0.70–1.35)
Globulin: 3.4 g/dL (calc) (ref 1.9–3.7)
Glucose, Bld: 91 mg/dL (ref 65–99)
Potassium: 4.8 mmol/L (ref 3.5–5.3)
Sodium: 141 mmol/L (ref 135–146)
Total Bilirubin: 0.4 mg/dL (ref 0.2–1.2)
Total Protein: 7.5 g/dL (ref 6.1–8.1)

## 2023-07-04 LAB — HEMOGLOBIN A1C
Hgb A1c MFr Bld: 5.7 % of total Hgb — ABNORMAL HIGH (ref <5.7)
Mean Plasma Glucose: 117 mg/dL
eAG (mmol/L): 6.5 mmol/L

## 2023-07-04 LAB — LIPID PANEL
Cholesterol: 200 mg/dL — ABNORMAL HIGH (ref <200)
HDL: 67 mg/dL (ref >=40)
LDL Cholesterol (Calc): 117 mg/dL (calc) — ABNORMAL HIGH
Non-HDL Cholesterol (Calc): 133 mg/dL (calc) — ABNORMAL HIGH (ref <130)
Total CHOL/HDL Ratio: 3 (calc) (ref <5.0)
Triglycerides: 69 mg/dL (ref <150)

## 2023-07-04 LAB — CBC WITH DIFFERENTIAL/PLATELET
Absolute Monocytes: 514 cells/uL (ref 200–950)
Basophils Absolute: 10 cells/uL (ref 0–200)
Basophils Relative: 0.2 %
Eosinophils Absolute: 29 cells/uL (ref 15–500)
Eosinophils Relative: 0.6 %
HCT: 43.1 % (ref 38.5–50.0)
Hemoglobin: 14.5 g/dL (ref 13.2–17.1)
Lymphs Abs: 2011 cells/uL (ref 850–3900)
MCH: 30.5 pg (ref 27.0–33.0)
MCHC: 33.6 g/dL (ref 32.0–36.0)
MCV: 90.5 fL (ref 80.0–100.0)
MPV: 12.8 fL — ABNORMAL HIGH (ref 7.5–12.5)
Monocytes Relative: 10.7 %
Neutro Abs: 2237 cells/uL (ref 1500–7800)
Neutrophils Relative %: 46.6 %
Platelets: 202 10*3/uL (ref 140–400)
RBC: 4.76 10*6/uL (ref 4.20–5.80)
RDW: 12.9 % (ref 11.0–15.0)
Total Lymphocyte: 41.9 %
WBC: 4.8 10*3/uL (ref 3.8–10.8)

## 2023-07-04 LAB — PSA: PSA: 3.49 ng/mL (ref <=4.00)

## 2023-07-04 LAB — URINE CULTURE
MICRO NUMBER:: 15230042
SPECIMEN QUALITY:: ADEQUATE

## 2023-07-04 NOTE — Therapy (Signed)
OUTPATIENT OCCUPATIONAL THERAPY NEURO TREATMENT AND DISCHARGE  Patient Name: Donald [name] MRN: [id number] DOB:August 28, 1956, 67 y.o., male Today's Date: 07/04/2023  OCCUPATIONAL THERAPY DISCHARGE SUMMARY  Visits from Start of Care: 7  Current functional level related to goals / functional outcomes: Patient has met 1/1 short-term goals and 2/2 long-term goals to date.   Remaining deficits: LUE spasticity with contracture   Education / Equipment: Continue with therapy recommendations following d/c   Patient agrees to discharge. Patient goals were partially met. Patient is being discharged due to  independence with HEP.Marland Kitchen   PCP: [name], MD  REFERRING PROVIDER: Ranelle Oyster, MD  END OF SESSION:   OT End of Session - 07/04/23 1233     Visit Number 7    Number of Visits 7    Date for OT Re-Evaluation 07/07/23    Authorization Type [employer] Medicare    Progress Note Due on Visit 7    OT Start Time 1233    OT Stop Time 1314    OT Time Calculation (min) 41 min    Activity Tolerance Patient tolerated treatment well   limited by cognition   Behavior During Therapy WFL for tasks assessed/performed              Past Medical History:  Diagnosis Date   Arthritis    Stroke [facility])    Past Surgical History:  Procedure Laterality Date   APPENDECTOMY     IR ANGIO INTRA EXTRACRAN SEL COM CAROTID INNOMINATE BILAT MOD SED  05/28/2021   IR ANGIO VERTEBRAL SEL VERTEBRAL BILAT MOD SED  05/28/2021   IR IVC FILTER PLMT / S&I /IMG GUID/MOD SED  05/27/2021   IR US GUIDE VASC ACCESS RIGHT  05/27/2021   Patient Active Problem List   Diagnosis Date Noted   Cervical dystonia 04/12/2023   Dementia (HCC) 03/29/2022   H/O: CVA (cerebrovascular accident) 03/29/2022   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) 02/09/2022   At high risk for inadequate nutritional intake 07/12/2021   Transaminitis 06/06/2021   Intraparenchymal hematoma of brain (HCC) 06/04/2021   Right  femoral vein DVT (HCC) 05/30/2021   Right lower lobe pneumonia 05/30/2021   Prediabetes 05/30/2021   Hyperglycemia 05/30/2021   Intracerebral hemorrhage 05/24/2021   Seizure (HCC) 05/23/2021   Malnutrition of moderate degree 05/22/2021   Presence of other vascular implants and grafts 05/16/2021   Memory deficits    OSA (obstructive sleep apnea)    Generalized OA    Hypokalemia    Leukocytosis    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 (HCC) 05/11/2021   Unspecified dementia, unspecified severity, without behavioral disturbance, psychotic disturbance, mood disturbance, and anxiety (HCC) 12/12/2020   Essential (primary) hypertension 12/13/2019   Personal history of other venous thrombosis and embolism 05/27/2019   Annual physical exam 04/03/2017    ONSET DATE:  04/12/2023 (date of referral) CVA was in March 2022  REFERRING DIAG: W09.811 (ICD-10-CM) - 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   THERAPY DIAG:  Muscle weakness (generalized)  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  Left femoral DVT/Antithrombotics: IVC filter in place, seizures, HTN,  dementia   PRECAUTIONS: Other: significant residual L spastic hemiplegia, cognitive impairment, dementia, condom catheter  WEIGHT BEARING RESTRICTIONS: No  PAIN:  Are you having pain? No  FALLS: Has patient fallen in last 6 months? No  Lives with: lives with their spouse and family sometimes helps  Lives in: House/apartment   Has following equipment at home: Wheelchair (manual), shower chair, bed side commode, and Stedy. 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   OBJECTIVE:   HAND DOMINANCE: Right  ADLs: Overall ADLs: max to total A Transfers/ambulation related to ADLs: Eating: feeds self with right hand UB Dressing: Max A LB Dressing: total A Toileting: total A occasionally will alert wife he needs to have a BM  Bathing: max A - will wash face mostly when given washcloth  Equipment: bed side commode  IADLs: Dependent  MOBILITY STATUS:  wheelchair bound, requires assistance for propelling  POSTURE COMMENTS:  L lean Sitting balance: Poor  ACTIVITY TOLERANCE: Activity tolerance: poor  FUNCTIONAL OUTCOME MEASURES: PSFS: 3  - 05/23/2023 (eval)  07/04/2023   Total score = sum of the activity scores/number of activities Minimum detectable change (90%CI) for average score = 2 points Minimum detectable change (90%CI) for single activity score = 3 points  UPPER EXTREMITY ROM:    RUE: WNL LUE: lacks active movement though does appear to guard with PROM  PROM (hard end feel) Right (eval) Left (eval)  Shoulder flexion WNL 80  Shoulder abduction WNL   Elbow flexion WNL   Elbow  extension WNL -90  Wrist flexion WNL   Wrist extension WNL -45  Wrist pronation WNL   Wrist supination WNL    Digit Composite Flexion WNL   Digit Composite Extension WNL WFL; hyperextension PIPs  Digit Opposition WNL   (Blank rows = not tested)  UPPER EXTREMITY MMT:     RUE: WFL LUE: unable to test formally; BFL given lack of AROM though pt guards PROM  HAND FUNCTION: UTA  COORDINATION: UTA  SENSATION: UTA  EDEMA: None reported or observed  MUSCLE TONE: LUE: Severe and Hypertonic  COGNITION: Overall cognitive status: Impaired and not consistently oriented to person, place or time   VISION: Subjective report: used to wear glasses; L VF cut following CVA  PERCEPTION: Impaired: Inattention/neglect: does not attend to left visual field and does not attend to left side of body and Spatial orientation: requires cues  PRAXIS: Impaired: Initiation and Motor planning  OBSERVATIONS: Pt appears fairly well-kept. In manual w/c with poor positioning, only has R footrest, catheter in place. Pt is strapped into w/c with a vest to prevent him from sliding out.     TODAY'S TREATMENT:                                                                                                                               -  Self-care/home management completed for duration as noted below including:  Therapist reviewed goals with patient and spouse and updated patient progression.   HEP reviewed and provided for concise program.   Educated spouse on use of wedge with tray table.   PATIENT EDUCATION: Education details: OT D/C: LUE stretching and positioning Person educated: Patient and Spouse Education method: Explanation and Demonstration Education comprehension: verbalized understanding and needs further education  HOME EXERCISE PROGRAM: 06/21/2023 - hand under hand techniques  GOALS:  SHORT TERM GOALS: Target date: 06/20/2023    Caregiver will demonstrate independence with LUE ROM  HEP. Baseline: no set HEP Goal status: MET   LONG TERM GOALS: Target date: 07/07/2023    Patient will report at least two-point increase in average PSFS score or at least three-point increase in a single activity score indicating functionally significant improvement given minimum detectable change.  Baseline: 3 total score (individual activities r/t stretch, dress/bathing) 07/04/2023: 5 total (individual activity scores above) Goal status: MET  2.  Pt will participate in ADLs using adaptive strategies or AD as needed.  Baseline: limited participation Goal status: MET  ASSESSMENT:  CLINICAL IMPRESSION:  Patient is appropriate for discharge and no longer demonstrates medical necessity for continued skilled occupational services.  PERFORMANCE DEFICITS: in functional skills including ADLs, IADLs, coordination, tone, ROM, strength, pain, Fine motor control, Gross motor control, mobility, body mechanics, decreased knowledge of use of DME, vision, UE functional use, and cognition  IMPAIRMENTS: are limiting patient from ADLs, IADLs, work, and leisure.   CO-MORBIDITIES: has co-morbidities such as dementia/impaired cognitive status  that affects occupational performance. Patient will benefit from skilled OT to address above impairments and improve overall function.  REHAB POTENTIAL: Fair given chronicity of stroke and pt's cognition  PLAN:  OT D/C Completed   Delana Meyer, OT 07/04/2023, 1:40 PM

## 2023-07-04 NOTE — Therapy (Signed)
OUTPATIENT PHYSICAL THERAPY NEURO TREATMENT/DISCHARGE SUMMARY   Patient Name: Donald [name] MRN: [id number] DOB:1956/09/22, 67 y.o., male Today's Date: 07/04/2023   PCP: [name], MD REFERRING PROVIDER: Ranelle Oyster, MD  END OF SESSION:  PT End of Session - 07/04/23 1151     Visit Number 7    Number of Visits 7    Date for PT Re-Evaluation 07/22/23    Authorization Type UHC    PT Start Time 1149    PT Stop Time 1230    PT Time Calculation (min) 41 min    Equipment Utilized During Treatment Gait belt    Activity Tolerance Patient tolerated treatment well    Behavior During Therapy WFL for tasks assessed/performed;Flat affect              Past Medical History:  Diagnosis Date   Arthritis    Stroke [facility])    Past Surgical History:  Procedure Laterality Date   APPENDECTOMY     IR ANGIO INTRA EXTRACRAN SEL COM CAROTID INNOMINATE BILAT MOD SED  05/28/2021   IR ANGIO VERTEBRAL SEL VERTEBRAL BILAT MOD SED  05/28/2021   IR IVC FILTER PLMT / S&I /IMG GUID/MOD SED  05/27/2021   IR US GUIDE VASC ACCESS RIGHT  05/27/2021   Patient Active Problem List   Diagnosis Date Noted   Cervical dystonia 04/12/2023   Dementia (HCC) 03/29/2022   H/O: CVA (cerebrovascular accident) 03/29/2022   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) 02/09/2022   At high risk for inadequate nutritional intake 07/12/2021   Transaminitis 06/06/2021   Intraparenchymal hematoma of brain (HCC) 06/04/2021   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HCC) 05/30/2021   Right lower lobe pneumonia 05/30/2021   Prediabetes 05/30/2021   Hyperglycemia 05/30/2021   Intracerebral hemorrhage 05/24/2021   Seizure (HCC) 05/23/2021   Malnutrition of moderate degree 05/22/2021   Presence of other vascular implants and grafts 05/16/2021   Memory deficits    OSA (obstructive sleep apnea)    Generalized OA    Hypokalemia    Leukocytosis    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 (HCC)  05/11/2021   Unspecified dementia, unspecified severity, without behavioral disturbance, psychotic disturbance, mood disturbance, and anxiety (HCC) 12/12/2020   Essential (primary) hypertension 12/13/2019   Personal history of other venous thrombosis and embolism 05/27/2019   Annual physical exam 04/03/2017    ONSET DATE: 04/12/2023 (date of referral)  REFERRING DIAG: I69.354 (ICD-10-CM) - 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 (HCC)   THERAPY DIAG:  Muscle weakness (generalized)  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)  Abnormal posture  Rationale for Evaluation and Treatment: Rehabilitation  SUBJECTIVE:  SUBJECTIVE STATEMENT: Saw his PCP yesterday. Got a urinalysis due to pt having dark urine. Brought in head rest to be re-adjusted on wheelchair today as previously it brought his head more into forward flexion. 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  Left femoral DVT/Antithrombotics: IVC filter in place, seizures, HTN, dementia   PAIN:  Are you having pain? No  There were no vitals filed for this visit.     PRECAUTIONS: Other: significant residual L spastic hemiplegia, cognitive impairment, dementia, condom catheter    WEIGHT BEARING RESTRICTIONS: No  FALLS: Has patient fallen in last 6 months? No  LIVING ENVIRONMENT: Lives with: lives with their spouse and family sometimes helps  Lives in: House/apartment  Has following equipment at home:  Wheelchair (manual), shower chair, bed side commode, and Stedy. Pt's wife bathes pt in [place] when it is just her, otherwise when family is over, they bring pt to the shower chair   PLOF: Needs assistance with ADLs and Needs assistance with transfers  PATIENT GOALS: Per wife - wants to work on sitting balance, try to weight bear through L side. Work on moving his hand   OBJECTIVE:   COGNITION: Overall cognitive status: Impaired   SENSATION: Unable to assess due to cognitive deficits, pt not able to follow directions   MUSCLE TONE: LLE: Severe and Hypertonic  POSTURE: rounded shoulders, forward head, increased thoracic kyphosis, posterior pelvic tilt, flexed trunk , weight shift left, and significant trunk shortening on L side, head held in L lateral cervical flexion   Pt currently in standard manual w/c, has other personalized w/c with recline and higher back rest is broken. Pt only with R foot rest and has LLE on R foot rest.   Pt is strapped into w/c with a vest to prevent him from sliding out.    LOWER EXTREMITY ROM:    Significant hypomobility to L hamstring, L ankle DF, tested passively in sitting    LOWER EXTREMITY MMT:   Unable to assess due to cognitive deficits, pt not able to follow directions for MMT testing   Pt needing PT assist to pick up LLE during entirety of session.   BED MOBILITY:  Did not get to assess during eval due to time constraints.   TRANSFERS: Assistive device utilized:  [employer] to stand: Max A Stand to sit: Mod A Pt with significant tightness/rigidity in trunk, unable to actively lean forwards, needs max assist for forward lean and cues for RUE placement on Stedy.  Once in standing pt with significant L>R knee flexion, forward posture with incr trunk flexion, and decr L ankle ROM with L heel unable to be placed on the [employer].  In [place], attempted to cue to look upright for posture with visual cue on mirror, pt unable to follow  cues for posture. Attempted to provide additional cue at buttocks for hip extension.   When sitting back down in manual w/c, pt needing mod A for controlled descent and max cues for hip flexion (with pt unable to lean forwards).   GAIT: Pt is non-ambulatory.   TODAY'S TREATMENT:      Pt transferred from w/c to mat table using Stedy. Pt mumbling to himself throughout session today, but more alert compared to previous sessions. With  initial stand from [place], pt needing min verbal/tactile/demo cues for proper RUE placement on farthest [facility] bar, pt able to perform sit > stand with min A with heavy reliance on RUE to come to stand. Pt with improvements in initiating pulling to stand today.   Had pt stand without buttocks touching seat in [place] for approx. 1 minute and 30 seconds with min A. Pt then sitting back down to pads on Stedy.  Pt transferred dependently with Stedy over to mat table.  Had pt stay standing in [place] to work on upright tolerance/posture. Pt remains in a half perch position on Stedy pads with incr knee flexion, hip flexion, cervical flexion. Worked on pulling to stand with Stedy, with pt able to perform 2 minutes and 50 seconds standing with max cues and encouragement to stay standing instead of sitting back down to the mat table. Pt did well with standing when removing pads behind pt. Pt then needing min A to help with controlled descent back down to mat table. Worked on sit > stands with [place], with pt able to perform with min/mod A to help initiate it, but then pt only needs CGA/min A for remainder of pull to stand, performed x3 reps total. Performed bouts of trying to stand as tall as he can: performed for 1 minute 30 seconds, 1 minute, and 2 minutes (all with seated rest break between). Tried to get pt to look up at pt's spouse for cervical extension, but pt continuing to look down at the floor. In standing, noted pt with LLE clonus, but went away with PT providing weight bearing  through L side.   Had pt sit down with chair posteriorly and single pillow behind him, pt unable to perform unsupported sitting today and had incr posterior lean and to the L. Pt's wife grabbed pt's R hand and cued pt to "try not to let her fall" by leaning anteriorly, with initial mod A from therapist. When pt would let go of wife's hand, pt fell back posteriorly to chair and unable to keep weight anteriorly today.   At end of session, pt needing min/mod A to pull to stand from [place] from lower mat table (pt needing min multi-modal cues on reaching forward). Pt transferred dependently back to w/c with Stedy and mod A for stand > sit from [place] for controlled descent. Pt's spouse helped tip pt's wheelchair back for improved positioning in chair.   PATIENT EDUCATION: Education details: Continue HEP, working on sitting at home , D/C from therapy Person educated: Patient and Spouse Education method: Explanation Education comprehension: verbalized understanding  HOME EXERCISE PROGRAM: -RUE reaching up, laterally, forward, cross body to left knee -Sustained left holds/weight shifts -Passive hamstring stretch propping LE in chair with heels floated -Can attempt LAQ and marching of the RLE -Manual stretch of the right pec for posture -Positioning in chair to prevent worsening posture  PHYSICAL THERAPY DISCHARGE SUMMARY  Visits from Start of Care: 7  Current functional level related to goals / functional outcomes: See LTGs/Clinical Assessment Statement    Remaining deficits: Severe LLE hypertonicity, impaired balance, decr ROM, postural abnormalities, cognitive impairments/dementia, decr strength    Education / Equipment: HEP, proper positioning in w/c   Patient agrees to discharge. Patient goals were partially met. Patient is being discharged due to maximized rehab potential.    GOALS: Goals reviewed with patient? Yes  SHORT TERM GOALS: Target date: 06/13/2023  Pt will perform initial  HEP with family assist for improved ROM,  posture, strength.  Baseline: Goal status: MET  2.  Sitting balance to be assessed with STG/LTG written.  Baseline: LTG written  Goal status: MET  3.  Pt will perform at least 3 minutes of standing in [place] with min A for improved tolerance/participation in ADLs.  Baseline: pt able to stand ~5 minutes with CGA/min A  Goal status: MET    LONG TERM GOALS: Target date: 07/04/2023  Pt will perform final HEP with family assist for improved ROM, posture, strength.  Baseline:  Goal status: MET  2.  Pt will remain in upright unsupported sitting x2 minutes to improve participation in ADLs and decrease caregiver burden.    Baseline: 1 minute and 27 seconds (6/20)  Pt unable to perform unsupported sitting on 07/04/23 Goal status: NOT MET  3.  Pt will perform sit > stand from w/c to [facility] with mod A in order to decr caregiver burden.  Baseline: max A   Min/mod A on 07/04/23 Goal status: MET  4.  Pt will perform at least 6 minutes of standing in [place] with min A for improved tolerance/participation in ADLs.  Baseline: max 2 minutes 50 seconds on 07/04/23  Goal status: NOT MET   ASSESSMENT:  CLINICAL IMPRESSION: Today's skilled session focused on working on standing tolerance with Stedy with RUE support. Performed a few reps total today, with pt able to stand for a max time of 2 minutes and 50 seconds with CGA/min A. Pt more alert during session today compared to previous sessions and better able to initiate RUE placement on Stedy and pulling himself to stand. Attempted unsupported sitting, but pt unable to sit unsupported today as pt with incr lean posteriorly and to the L. Pt did not meet goals in regards to standing time and unsupported sitting time, but did meet HEP goal with wife assist and sit > stand goal with Stedy. Pt has reached his maximum potential with PT at this time and will be discharged with pt to continue working on this at home with  his spouse. Pt's performance in therapy varies based on attention and fatigue. Pt and spouse in agreement with D/C from PT at this time.    OBJECTIVE IMPAIRMENTS: decreased activity tolerance, decreased balance, decreased cognition, decreased coordination, decreased knowledge of condition, decreased mobility, decreased ROM, decreased strength, hypomobility, impaired flexibility, impaired sensation, impaired tone, impaired UE functional use, impaired vision/preception, and postural dysfunction.   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  Left femoral DVT/Antithrombotics: IVC filter in place, seizures, HTN, dementia   are also affecting patient's functional outcome.   REHAB POTENTIAL: Fair due to time since onset, significant residual spastic hemiplegia and cognitive impairment from CVA in 2022, dementia   CLINICAL DECISION MAKING: Evolving/moderate complexity  EVALUATION COMPLEXITY: Moderate  PLAN:  PT FREQUENCY: 1x/week  PT DURATION: 8 weeks - only anticipate 6 weeks, due to delay in scheduling   PLANNED INTERVENTIONS: Therapeutic exercises, Therapeutic activity, Neuromuscular re-education, Balance training, Gait training, Patient/Family education, Self Care, Joint mobilization, Manual therapy, and Re-evaluation  PLAN FOR NEXT SESSION: D/C   Drake Leach, PT, DPT  07/04/2023, 1:09  PM

## 2023-07-05 ENCOUNTER — Encounter: Payer: Self-pay | Admitting: Physical Medicine & Rehabilitation

## 2023-07-05 ENCOUNTER — Encounter
Payer: Commercial Managed Care - PPO | Attending: Physical Medicine & Rehabilitation | Admitting: Physical Medicine & Rehabilitation

## 2023-07-05 VITALS — BP 117/71 | HR 74 | Ht 65.0 in | Wt 135.0 lb

## 2023-07-05 DIAGNOSIS — G8114 Spastic hemiplegia affecting left nondominant side: Secondary | ICD-10-CM | POA: Diagnosis present

## 2023-07-05 DIAGNOSIS — I610 Nontraumatic intracerebral hemorrhage in hemisphere, subcortical: Secondary | ICD-10-CM | POA: Diagnosis present

## 2023-07-05 NOTE — Progress Notes (Signed)
Donald [name] is here in follow up of his spastic right hemiparesis. He had good results with botox injections to his left arm and neck. Neck results have not been as impressive. He continues to require a lot of care which his wife continues to provide  ROS: Limited due to cognitive/behavioral    BP 117/71   Pulse 74   Ht 5\' 5"  (1.651 m)   Wt 135 lb (61.2 kg) Comment: PER Family  SpO2 98%   BMI 22.47 kg/m     General: No acute distress HEENT: NCAT, EOMI, oral membranes moist Cards: reg rate  Chest: normal effort Abdomen: Soft, NT, ND Skin: dry, intact Extremities: no edema Psych: pleasant and appropriate  Skin: intact Neuro: sleepy, does communicate. Limited insight  Speech is dysarthric with lower volume today.  Patient able to follow simple one-step commands.  better siting posture  Left sternocleidomastoid 2 out of 4. , left biceps and brachioradialis, wrist/finger flexors are 1 out of 4.  Hamstring tightness 2-3/4.  Left upper extremity 0 out of 5 left lower extremity 0-5 in regard to motor function.  Patient remains sensitive to touch in the left upper extremity.  less pain with rom but still tender.  Musculoskeletal:  LUE tender with PROM           Assessment & Plan:    1.  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              -Received custom wheelchair--              -ext[facility] help with getting him a caregiver at home   2.  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 Low back pain, left hemiplegic shoulder, ?HO left hip,/neuropathic pain:     4.  Spastic left hemiparesis, cervical dystonia Continue aggressive ROM, HEP,  therapies - neck roll to help support his head . -will repeat  botox with 500u left biceps, finger flexors., left SCM --focus more on SCM this time             -continue ROM/splinting  .   5. Seizures:             -lacosamide 100mg  bid             -keppra 250mg  bid 6. Bowels and bladder: condom cath, incontinence mgt     25 minutes of face to face patient care time were spent during this visit. All questions were encouraged and answered.   f/u for botox in October

## 2023-07-05 NOTE — Patient Instructions (Signed)
ALWAYS FEEL FREE TO CALL OUR OFFICE WITH ANY PROBLEMS OR QUESTIONS ([phone])  **PLEASE NOTE** ALL MEDICATION REFILL REQUESTS (INCLUDING CONTROLLED SUBSTANCES) NEED TO BE MADE AT LEAST 7 DAYS PRIOR TO REFILL BEING DUE. ANY REFILL REQUESTS INSIDE THAT TIME FRAME MAY RESULT IN DELAYS IN RECEIVING YOUR PRESCRIPTION.                    

## 2023-07-31 ENCOUNTER — Other Ambulatory Visit: Payer: Self-pay

## 2023-07-31 ENCOUNTER — Emergency Department (HOSPITAL_COMMUNITY): Payer: Commercial Managed Care - PPO

## 2023-07-31 ENCOUNTER — Emergency Department (HOSPITAL_COMMUNITY)
Admission: EM | Admit: 2023-07-31 | Discharge: 2023-07-31 | Disposition: A | Discharge status: Home / Self Care | Payer: Commercial Managed Care - PPO | Attending: Emergency Medicine | Admitting: Emergency Medicine

## 2023-07-31 ENCOUNTER — Encounter (HOSPITAL_COMMUNITY): Payer: Self-pay

## 2023-07-31 DIAGNOSIS — R4182 Altered mental status, unspecified: Secondary | ICD-10-CM | POA: Diagnosis not present

## 2023-07-31 DIAGNOSIS — R319 Hematuria, unspecified: Secondary | ICD-10-CM | POA: Diagnosis not present

## 2023-07-31 DIAGNOSIS — Z79899 Other long term (current) drug therapy: Secondary | ICD-10-CM | POA: Insufficient documentation

## 2023-07-31 DIAGNOSIS — I10 Essential (primary) hypertension: Secondary | ICD-10-CM | POA: Diagnosis not present

## 2023-07-31 DIAGNOSIS — F0390 Unspecified dementia without behavioral disturbance: Secondary | ICD-10-CM | POA: Insufficient documentation

## 2023-07-31 LAB — URINALYSIS, ROUTINE W REFLEX MICROSCOPIC
Bacteria, UA: NONE SEEN
Bilirubin Urine: NEGATIVE
Glucose, UA: NEGATIVE mg/dL
Ketones, ur: NEGATIVE mg/dL
Leukocytes,Ua: NEGATIVE
Nitrite: NEGATIVE
Protein, ur: NEGATIVE mg/dL
Specific Gravity, Urine: 1.009 (ref 1.005–1.030)
pH: 7 (ref 5.0–8.0)

## 2023-07-31 LAB — BASIC METABOLIC PANEL
Anion gap: 9 (ref 5–15)
BUN: 9 mg/dL (ref 8–23)
CO2: 24 mmol/L (ref 22–32)
Calcium: 9 mg/dL (ref 8.9–10.3)
Chloride: 104 mmol/L (ref 98–111)
Creatinine, Ser: 1.12 mg/dL (ref 0.61–1.24)
GFR, Estimated: >60 mL/min (ref >60)
Glucose, Bld: 104 mg/dL — ABNORMAL HIGH (ref 70–99)
Potassium: 3.7 mmol/L (ref 3.5–5.1)
Sodium: 137 mmol/L (ref 135–145)

## 2023-07-31 LAB — CBC
HCT: 45 % (ref 39.0–52.0)
Hemoglobin: 14.4 g/dL (ref 13.0–17.0)
MCH: 30.4 pg (ref 26.0–34.0)
MCHC: 32 g/dL (ref 30.0–36.0)
MCV: 94.9 fL (ref 80.0–100.0)
Platelets: 240 10*3/uL (ref 150–400)
RBC: 4.74 MIL/uL (ref 4.22–5.81)
RDW: 13.3 % (ref 11.5–15.5)
WBC: 5.3 10*3/uL (ref 4.0–10.5)
nRBC: 0 % (ref 0.0–0.2)

## 2023-07-31 MED ORDER — LACOSAMIDE 50 MG PO TABS
100.0000 mg | ORAL_TABLET | Freq: Two times a day (BID) | ORAL | Status: DC
  Administered 2023-07-31: 100 mg via ORAL
  Filled 2023-07-31: qty 2

## 2023-07-31 MED ORDER — LEVETIRACETAM 100 MG/ML PO SOLN
250.0000 mg | Freq: Two times a day (BID) | ORAL | Status: DC
  Administered 2023-07-31: 250 mg via ORAL
  Filled 2023-07-31: qty 5

## 2023-07-31 MED ORDER — LACTATED RINGERS IV BOLUS
1000.0000 mL | Freq: Once | INTRAVENOUS | Status: AC
  Administered 2023-07-31: 1000 mL via INTRAVENOUS

## 2023-07-31 NOTE — Discharge Instructions (Addendum)
His workup today was reassuring.  I recommend he continue all of his home medications and follow-up closely with his doctor within the next week.  If he develops any new or concerning symptoms he should return to the ED.

## 2023-07-31 NOTE — ED Notes (Signed)
 Pt was stuck. Not successful.

## 2023-07-31 NOTE — ED Provider Notes (Signed)
[place] EMERGENCY DEPARTMENT AT [facility] Provider Note   CSN: [id number] Arrival date & time: 07/31/23  1032     History  Chief Complaint  Patient presents with   Altered Mental Status    Donald [name] is a 67 y.o. male.   Altered Mental Status 67 year old male history of left-sided hemiplegia following stroke, hypertension presenting for reported altered mental status.  Patient states he feels well denies any concerns.  Talked with patient's wife and his daughter.  They report he has history of dementia and at baseline is immobile on the left side.  They report he is currently staying with an aunt, who noted some brief shaking today with decreased responsiveness concerning for possible seizure.  There were also concern for possible UTI.  They feel that he is currently on his baseline health.  They are not sure if he is missed any medication doses, he is on multiple seizure medications.  Last seizure was a few weeks ago.  Has not had any head trauma, fever, chest pain, Donnell pain, vomiting.  He is eating and drinking.    Home Medications Prior to Admission medications   Medication Sig Start Date End Date Taking? Authorizing Provider  amLODipine (NORVASC) 2.5 MG tablet Take 1 tablet (2.5 mg total) by mouth daily. 12/09/22   [name], MD  Lacosamide 100 MG TABS Take 1 tablet (100 mg total) by mouth 2 (two) times daily. 05/23/23   Penumalli, Glenford Bayley, MD  levETIRAcetam (KEPPRA) 100 MG/ML solution Take 2.5 mLs (250 mg total) by mouth 2 (two) times daily. 12/06/22 03/09/23  Micki Riley, MD  levETIRAcetam (KEPPRA) 100 MG/ML solution Take 2.5 mLs (250 mg total) by mouth 2 (two) times daily. 02/22/23 02/17/24  Micki Riley, MD  Multiple Vitamin (MULTIVITAMIN) tablet Take 1 tablet by mouth daily.    [provider]  nitroGLYCERIN (NITROSTAT) 0.4 MG SL tablet Dissolve 1 tablet under the tongue every 5 minutes as needed for chest pain. Max of 3 doses, then  911. 01/04/23   Nahser, Deloris Ping, MD      Allergies    Sulfa antibiotics and Zanaflex [tizanidine]    Review of Systems   Review of Systems Review of systems completed and notable as per HPI.  ROS otherwise negative.   Physical Exam Updated Vital Signs BP 134/78 (BP Location: Right Arm)   Pulse 76   Temp 98.8 F (37.1 C) (Oral)   Resp 18   Ht 5\' 5"  (1.651 m)   Wt 61.2 kg   SpO2 99%   BMI 22.45 kg/m  Physical Exam Vitals and nursing note reviewed.  Constitutional:      General: He is not in acute distress.    Appearance: He is well-developed.  HENT:     Head: Normocephalic and atraumatic.     Mouth/Throat:     Mouth: Mucous membranes are moist.     Pharynx: Oropharynx is clear.  Eyes:     Extraocular Movements: Extraocular movements intact.     Conjunctiva/sclera: Conjunctivae normal.     Pupils: Pupils are equal, round, and reactive to light.  Cardiovascular:     Rate and Rhythm: Normal rate and regular rhythm.     Heart sounds: No murmur heard. Pulmonary:     Effort: Pulmonary effort is normal. No respiratory distress.     Breath sounds: Normal breath sounds.  Abdominal:     Palpations: Abdomen is soft.     Tenderness: There is no  abdominal tenderness.  Musculoskeletal:        General: No swelling.     Cervical back: Neck supple.     Right lower leg: No edema.     Left lower leg: No edema.  Skin:    General: Skin is warm and dry.     Capillary Refill: Capillary refill takes less than 2 seconds.  Neurological:     Mental Status: He is alert. Mental status is at baseline.     Cranial Nerves: No cranial nerve deficit.     Comments: Baseline left-sided weakness.  Awake, oriented to self.  Disoriented to time.  Able to answer basic questions.  Psychiatric:        Mood and Affect: Mood normal.     ED Results / Procedures / Treatments   Labs (all labs ordered are listed, but only abnormal results are displayed) Labs Reviewed  URINALYSIS, ROUTINE W REFLEX  MICROSCOPIC - Abnormal; Notable for the following components:      Result Value   Color, Urine STRAW (*)    Hgb urine dipstick MODERATE (*)    All other components within normal limits  BASIC METABOLIC PANEL - Abnormal; Notable for the following components:   Glucose, Bld 104 (*)    All other components within normal limits  URINE CULTURE  CBC    EKG None  Radiology CT Head Wo Contrast  Result Date: 07/31/2023 CLINICAL DATA:  Memory loss EXAM: CT HEAD WITHOUT CONTRAST TECHNIQUE: Contiguous axial images were obtained from the base of the skull through the vertex without intravenous contrast. RADIATION DOSE REDUCTION: This exam was performed according to the departmental dose-optimization program which includes automated exposure control, adjustment of the mA and/or kV according to patient size and/or use of iterative reconstruction technique. COMPARISON:  CT Head 03/29/22 FINDINGS: Brain: No acute hemorrhage. No hydrocephalus. No extra-axial fluid collection. There is a chronic hemorrhage in the right frontoparietal region. Generalized volume loss. Sequela mild chronic microvascular ischemic change. Vascular: No hyperdense vessel or unexpected calcification. Skull: Normal. Negative for fracture or focal lesion. Sinuses/Orbits: No middle ear or mastoid effusion. Mucosal thickening bilateral maxillary sinuses, left-greater-than-right. Orbits are unremarkable. Other: None. IMPRESSION: 1. No acute intracranial process. 2. Chronic hematoma in the right frontoparietal region. Electronically Signed   By: Lorenza Cambridge M.D.   On: 07/31/2023 13:12    Procedures Procedures    Medications Ordered in ED Medications  levETIRAcetam (KEPPRA) 100 MG/ML solution 250 mg (250 mg Oral Given 07/31/23 1756)  lacosamide (VIMPAT) tablet 100 mg (100 mg Oral Given 07/31/23 1756)  lactated ringers bolus 1,000 mL (0 mLs Intravenous Stopped 07/31/23 2254)    ED Course/ Medical Decision Making/ A&P Clinical Course as  of 07/31/23 2326  Mon Jul 31, 2023  2112 Talked with patient's daughter.  She visited him here and feels like he is at his baseline.  She is going to take him home. [JD]    Clinical Course User Index [JD] Laurence Spates, MD                                 Medical Decision Making Amount and/or Complexity of Data Reviewed Labs: ordered. Radiology: ordered.  Risk Prescription drug management.   Medical Decision Making:   Holger Arlinghaus is a 67 y.o. male who presented to the ED today with concern for possible breakthrough seizure.  Vital signs reviewed.  On exam he is well-appearing, he  has left-sided weakness but appears to be at his baseline.  I talked to family, they report possible breakthrough seizure earlier today or unsure if he missed any medications.  Here he is afebrile, he has no concerns or symptoms.  He does have chronic condom catheter, will evaluate for possible UTI.  He has no new deficits, low concern for stroke.  No signs of CNS infection.   Patient placed on continuous vitals and telemetry monitoring while in ED which was reviewed periodically.  Reviewed and confirmed nursing documentation for past medical history, family history, social history.  Initial Study Results:   Laboratory  All laboratory results reviewed.  Labs notable for CBC, BMP unremarkable.  Mild hematuria, no signs of infection.  Radiology:  All images reviewed independently.  CT head unremarkable.  Agree with radiology report at this time.  Reassessment and Plan:   On reassessment he remained stable.  Daughter was here and evaluated and she feels like he is at his baseline health.  He has had no additional seizures or other abnormality here.  He has some hematuria but no signs of UTI.  Suspect hematuria could be related to his condom catheter, he has some irritation from chronic catheter use I recommended they take a break from this for a few days.  He has no abdominal pain and benign abdominal  exam, low concern for nephrolithiasis or other cause.  He was given his home seizure medications, recommend they call PCP tomorrow.  Arranging transport for discharge home.  Return precautions given.  Family comfortable this plan.   Patient's presentation is most consistent with acute complicated illness / injury requiring diagnostic workup.           Final Clinical Impression(s) / ED Diagnoses Final diagnoses:  Altered mental status, unspecified altered mental status type    Rx / DC Orders ED Discharge Orders     None         Laurence Spates, MD 07/31/23 2326

## 2023-07-31 NOTE — ED Provider Notes (Addendum)
MSE note.  Patient has a history of dementia and a previous stroke and has a condom catheter.  He presents with worsening mental status and urine appears cloudy.  Physical exam patient alert oriented to person only.  Vital signs unremarkable.  He is bedbound and has significant weakness in his left arm and leg.  Patient is a condom catheter and the urine appears infected.  CBC chemistries urinalysis including urine cultures ordered.  Patient will also get CT of the head   Bethann Berkshire, MD 07/31/23 1110    Bethann Berkshire, MD 07/31/23 [phone]

## 2023-07-31 NOTE — ED Triage Notes (Signed)
Pt's daughter request PTAR cancel, pt placed in wheel chair by family & taken to lobby. Transferred by private vehicle

## 2023-07-31 NOTE — ED Notes (Signed)
No urine in cath to collect.

## 2023-07-31 NOTE — ED Triage Notes (Addendum)
Pt arrived via [facility] from home. Pt is bedbound and dementia. Pt has indwelling urinary catheter. Foul smelling urine, dark cloudyx3d. Pt has AMS. Left sided deficits from previous stroke

## 2023-07-31 NOTE — ED Notes (Signed)
[phone] Donald [name] Left call

## 2023-08-01 LAB — URINE CULTURE: Culture: NO GROWTH

## 2023-08-07 ENCOUNTER — Encounter: Payer: Self-pay | Admitting: Gastroenterology

## 2023-08-09 ENCOUNTER — Ambulatory Visit: Payer: Commercial Managed Care - PPO | Admitting: Physical Medicine & Rehabilitation

## 2023-08-10 ENCOUNTER — Emergency Department (HOSPITAL_BASED_OUTPATIENT_CLINIC_OR_DEPARTMENT_OTHER)
Admission: EM | Admit: 2023-08-10 | Discharge: 2023-08-10 | Disposition: A | Discharge status: Home / Self Care | Payer: Commercial Managed Care - PPO | Attending: Emergency Medicine | Admitting: Emergency Medicine

## 2023-08-10 ENCOUNTER — Other Ambulatory Visit: Payer: Self-pay

## 2023-08-10 DIAGNOSIS — R319 Hematuria, unspecified: Secondary | ICD-10-CM | POA: Diagnosis present

## 2023-08-10 DIAGNOSIS — E860 Dehydration: Secondary | ICD-10-CM | POA: Diagnosis not present

## 2023-08-10 DIAGNOSIS — R300 Dysuria: Secondary | ICD-10-CM | POA: Insufficient documentation

## 2023-08-10 DIAGNOSIS — R310 Gross hematuria: Secondary | ICD-10-CM | POA: Insufficient documentation

## 2023-08-10 DIAGNOSIS — F0390 Unspecified dementia without behavioral disturbance: Secondary | ICD-10-CM | POA: Diagnosis not present

## 2023-08-10 LAB — CBC
HCT: 43.4 % (ref 39.0–52.0)
Hemoglobin: 13.9 g/dL (ref 13.0–17.0)
MCH: 29.9 pg (ref 26.0–34.0)
MCHC: 32 g/dL (ref 30.0–36.0)
MCV: 93.3 fL (ref 80.0–100.0)
Platelets: 285 10*3/uL (ref 150–400)
RBC: 4.65 MIL/uL (ref 4.22–5.81)
RDW: 13.2 % (ref 11.5–15.5)
WBC: 4.7 10*3/uL (ref 4.0–10.5)
nRBC: 0 % (ref 0.0–0.2)

## 2023-08-10 LAB — URINALYSIS, ROUTINE W REFLEX MICROSCOPIC
Bacteria, UA: NONE SEEN
Bilirubin Urine: NEGATIVE
Glucose, UA: NEGATIVE mg/dL
Ketones, ur: NEGATIVE mg/dL
Nitrite: NEGATIVE
Specific Gravity, Urine: 1.021 (ref 1.005–1.030)
pH: 5.5 (ref 5.0–8.0)

## 2023-08-10 LAB — BASIC METABOLIC PANEL
Anion gap: 6 (ref 5–15)
BUN: 11 mg/dL (ref 8–23)
CO2: 30 mmol/L (ref 22–32)
Calcium: 9 mg/dL (ref 8.9–10.3)
Chloride: 104 mmol/L (ref 98–111)
Creatinine, Ser: 0.85 mg/dL (ref 0.61–1.24)
GFR, Estimated: >60 mL/min (ref >60)
Glucose, Bld: 72 mg/dL (ref 70–99)
Potassium: 4.1 mmol/L (ref 3.5–5.1)
Sodium: 140 mmol/L (ref 135–145)

## 2023-08-10 MED ORDER — LACTATED RINGERS IV BOLUS
1000.0000 mL | Freq: Once | INTRAVENOUS | Status: AC
  Administered 2023-08-10: 1000 mL via INTRAVENOUS

## 2023-08-10 NOTE — ED Notes (Signed)
I/O cath performed by Smith Robert, EMT and Tammy Sours RN assist. No resistance, pt had 150 cc clear yellow urine return. UA sent to lab. Pts daughters at bedside, condom cath replaced.

## 2023-08-10 NOTE — ED Provider Notes (Signed)
[place] EMERGENCY DEPARTMENT AT [facility] Provider Note   CSN: [id number] Arrival date & time: 08/10/23  1130     History  Chief Complaint  Patient presents with   Hematuria    Donald [name] is a 67 y.o. male.  67 year old male with a history of stroke who is wheelchair-bound, dementia, and seizures on lacosamide and Keppra who presents to the emergency department with hematuria and dysuria.  Patient is being cared for by his daughters and reports that over the past 3 weeks has had decreased p.o. intake.  They are worried that he is dehydrated because he has been making less urine as well.  Said that his urine appeared to be bloody yesterday and he was having some discomfort when attempting to urinate.  No fevers or chills or abdominal pain.  No flank pain.       Home Medications Prior to Admission medications   Medication Sig Start Date End Date Taking? Authorizing Provider  amLODipine (NORVASC) 2.5 MG tablet Take 1 tablet (2.5 mg total) by mouth daily. 12/09/22   [name], MD  Lacosamide 100 MG TABS Take 1 tablet (100 mg total) by mouth 2 (two) times daily. 05/23/23   Penumalli, Glenford Bayley, MD  levETIRAcetam (KEPPRA) 100 MG/ML solution Take 2.5 mLs (250 mg total) by mouth 2 (two) times daily. 12/06/22 03/09/23  Micki Riley, MD  levETIRAcetam (KEPPRA) 100 MG/ML solution Take 2.5 mLs (250 mg total) by mouth 2 (two) times daily. 02/22/23 02/17/24  Micki Riley, MD  Multiple Vitamin (MULTIVITAMIN) tablet Take 1 tablet by mouth daily.    [provider]  nitroGLYCERIN (NITROSTAT) 0.4 MG SL tablet Dissolve 1 tablet under the tongue every 5 minutes as needed for chest pain. Max of 3 doses, then 911. 01/04/23   Nahser, Deloris Ping, MD      Allergies    Sulfa antibiotics and Zanaflex [tizanidine]    Review of Systems   Review of Systems  Physical Exam Updated Vital Signs BP (!) 144/96 (BP Location: Right Arm)   Pulse 62   Temp 98 F (36.7 C) (Oral)    Resp 16   SpO2 100%  Physical Exam Constitutional:      Comments: Alert and responsive.  Wheelchair-bound.  Contractures of the left upper extremity  Abdominal:     General: There is no distension.     Palpations: There is no mass.     Tenderness: There is no abdominal tenderness. There is no guarding.  Genitourinary:    Comments: Condom catheter in place with dark brown urine.  Sediment also noted in Foley bag.    ED Results / Procedures / Treatments   Labs (all labs ordered are listed, but only abnormal results are displayed) Labs Reviewed  URINALYSIS, ROUTINE W REFLEX MICROSCOPIC - Abnormal; Notable for the following components:      Result Value   Hgb urine dipstick MODERATE (*)    Protein, ur TRACE (*)    Leukocytes,Ua SMALL (*)    All other components within normal limits  BASIC METABOLIC PANEL  CBC    EKG None  Radiology No results found.  Procedures Procedures    Medications Ordered in ED Medications  lactated ringers bolus 1,000 mL (0 mLs Intravenous Stopped 08/10/23 1522)    ED Course/ Medical Decision Making/ A&P  Medical Decision Making Amount and/or Complexity of Data Reviewed Labs: ordered.   Donald [name] is a 67 y.o. male with comorbidities that complicate the patient evaluation including dementia, stroke, and seizures on lacosamide who presents to the emergency department with hematuria  Initial Ddx:  UTI, bladder tumor/cancer, urinary retention, dehydration, AKI  MDM/Course:  Patient presents to the emergency department with dark urine over the past few days.  Patient's family believes it is bloody.  Does have some dark urine on exam but does not not appear to be grossly bloody.  Had an In-N-Out catheterization which showed moderate amount of hemoglobin and red blood cells with small leuk esterase but no nitrates and no bacteria.  Feel that he may be having some irritation of his bladder but did not feel  that this is consistent with UTI.  Had labs that did not show evidence of AKI but he was given some fluids due to his decreased p.o. intake recently.  Upon re-evaluation patient was doing well and had a new condom catheter placed.  Will have him follow-up with his primary doctor in several days and also urology to see if he needs cystoscopy for his hematuria.  This patient presents to the ED for concern of complaints listed in HPI, this involves an extensive number of treatment options, and is a complaint that carries with it a high risk of complications and morbidity. Disposition including potential need for admission considered.   Dispo: DC Home. Return precautions discussed including, but not limited to, those listed in the AVS. Allowed pt time to ask questions which were answered fully prior to dc. -Recommendations to follow-up with urology also discussed with the patient's daughters  Additional history obtained from daughter Records reviewed Outpatient Clinic Notes The following labs were independently interpreted: Urinalysis and show  hematuria I personally reviewed and interpreted cardiac monitoring: normal sinus rhythm  I personally reviewed and interpreted the pt's EKG: see above for interpretation  I have reviewed the patients home medications and made adjustments as needed Social Determinants of health:  Elderly         Final Clinical Impression(s) / ED Diagnoses Final diagnoses:  Gross hematuria  Dehydration    Rx / DC Orders ED Discharge Orders     None         Rondel Baton, MD 08/10/23 [phone]

## 2023-08-10 NOTE — ED Triage Notes (Signed)
Pt with daughter who assists with triage d/t pt hx of dementia. She reports hematuria that started yesterday and pt reporting pain in his penis while peeing. Pt has chronic foley use.

## 2023-08-10 NOTE — Discharge Instructions (Signed)
You were seen for your bloody urine in the emergency department.   At home, please stay well-hydrated.    Check your MyChart online for the results of any tests that had not resulted by the time you left the emergency department.   Follow-up with your primary doctor in 2-3 days regarding your visit.  Follow-up with the urologist to see if you need a cystoscopy to evaluate for any tumors in the bladder.  Return immediately to the emergency department if you experience any of the following: Decreased urination, flank pain, vomiting, fevers, or any other concerning symptoms.    Thank you for visiting our Emergency Department. It was a pleasure taking care of you today.

## 2023-08-25 ENCOUNTER — Telehealth: Payer: Self-pay | Admitting: *Deleted

## 2023-08-25 NOTE — Telephone Encounter (Signed)
Dr. Barron Alvine,  This pt is scheduled for a colonoscopy on 10-09-23.  His last one was in 2021- he had a large submucosal lipoma removed and was due to checked in 6-12 month.  He had a CVA May 2022.   Would you review his hx?  I'm not sure he would be able to ambulate here, would have difficulty prepping, among other issues.  Would you like an OV?  Thanks, [employer]

## 2023-08-29 NOTE — Telephone Encounter (Signed)
Donald Cleverly, DO   08/29/23 10:04 AM Agree, based on recent clinical history would be best that he has an OV first prior to proceeding with colonoscopy.

## 2023-08-29 NOTE — Telephone Encounter (Signed)
I have spoken to patient's wife to advise that due to patient's recent medical history/complications, Dr Barron Alvine would prefer to see and evaluate patient in the office first. At that time, we can better ascertain if patient is a good candidate for colonoscopy etc. Wife verbalizes understanding. Appointment with Dr Barron Alvine has been made for 10/06/23 at 240 pm.

## 2023-10-06 ENCOUNTER — Other Ambulatory Visit (HOSPITAL_COMMUNITY): Payer: Self-pay | Admitting: Urology

## 2023-10-06 ENCOUNTER — Ambulatory Visit: Payer: Commercial Managed Care - PPO | Admitting: Gastroenterology

## 2023-10-06 DIAGNOSIS — R310 Gross hematuria: Secondary | ICD-10-CM

## 2023-10-09 ENCOUNTER — Encounter: Payer: Commercial Managed Care - PPO | Admitting: Gastroenterology

## 2023-10-11 ENCOUNTER — Encounter: Payer: Self-pay | Admitting: Physical Medicine & Rehabilitation

## 2023-10-11 ENCOUNTER — Encounter
Payer: Commercial Managed Care - PPO | Attending: Physical Medicine & Rehabilitation | Admitting: Physical Medicine & Rehabilitation

## 2023-10-11 VITALS — BP 133/78 | HR 95 | Ht 65.0 in

## 2023-10-11 DIAGNOSIS — I69354 Hemiplegia and hemiparesis following cerebral infarction affecting left non-dominant side: Secondary | ICD-10-CM | POA: Insufficient documentation

## 2023-10-11 DIAGNOSIS — G243 Spasmodic torticollis: Secondary | ICD-10-CM | POA: Diagnosis not present

## 2023-10-11 DIAGNOSIS — G8114 Spastic hemiplegia affecting left nondominant side: Secondary | ICD-10-CM

## 2023-10-11 MED ORDER — ONABOTULINUMTOXINA 100 UNITS IJ SOLR
500.0000 [IU] | Freq: Once | INTRAMUSCULAR | Status: AC
Start: 2023-10-11 — End: 2023-10-11
  Administered 2023-10-11: 500 [IU] via INTRAMUSCULAR

## 2023-10-11 NOTE — Patient Instructions (Signed)
ALWAYS FEEL FREE TO CALL OUR OFFICE WITH ANY PROBLEMS OR QUESTIONS ([phone])  **PLEASE NOTE** ALL MEDICATION REFILL REQUESTS (INCLUDING CONTROLLED SUBSTANCES) NEED TO BE MADE AT LEAST 7 DAYS PRIOR TO REFILL BEING DUE. ANY REFILL REQUESTS INSIDE THAT TIME FRAME MAY RESULT IN DELAYS IN RECEIVING YOUR PRESCRIPTION.                    

## 2023-10-11 NOTE — Progress Notes (Signed)
Botox Injection for spasticity using needle EMG guidance Indication:  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 (HCC) - Plan: botulinum toxin Type A (BOTOX) injection 500 Units  Cervical dystonia - Plan: botulinum toxin Type A (BOTOX) injection 500 Units G81.14, G24.3        Total Units Injected:  500 Indication: Severe spasticity which interferes with ADL,mobility and/or  hygiene and is unresponsive to medication management and other conservative care Informed consent was obtained after describing risks and benefits of the procedure with the patient. This includes bleeding, bruising, infection, excessive weakness, or medication side effects. A REMS form is on file and signed.  Left arm Needle: 50mm injectable monopolar needle electrode  Number of units per muscle Sternocleidomastoid 75 units Levator scapulae 25 units Lats 50 units Pectoralis Major 0 units Pectoralis Minor 0 units Biceps 100 units Brachioradialis 50 units FCR 25 units FCU 25 units FDS 75 units FDP 75 units FPL 0 units Pronator Teres 0 units Pronator Quadratus 0 units Lumbricals 0 units  All injections were done after obtaining appropriate EMG activity and after negative drawback for blood. The patient tolerated the procedure well. Post procedure instructions were given.

## 2023-10-12 ENCOUNTER — Encounter (HOSPITAL_COMMUNITY): Payer: Self-pay

## 2023-10-12 ENCOUNTER — Ambulatory Visit (HOSPITAL_COMMUNITY)
Admission: RE | Admit: 2023-10-12 | Discharge: 2023-10-12 | Disposition: A | Discharge status: Home / Self Care | Payer: Commercial Managed Care - PPO | Source: Ambulatory Visit | Attending: Urology | Admitting: Urology

## 2023-10-12 DIAGNOSIS — R310 Gross hematuria: Secondary | ICD-10-CM

## 2023-10-18 ENCOUNTER — Other Ambulatory Visit (HOSPITAL_COMMUNITY): Payer: Self-pay | Admitting: Urology

## 2023-10-18 DIAGNOSIS — R310 Gross hematuria: Secondary | ICD-10-CM

## 2023-10-24 ENCOUNTER — Ambulatory Visit (HOSPITAL_COMMUNITY)
Admission: RE | Admit: 2023-10-24 | Discharge: 2023-10-24 | Disposition: A | Discharge status: Home / Self Care | Payer: Commercial Managed Care - PPO | Source: Ambulatory Visit | Attending: Urology | Admitting: Urology

## 2023-10-24 DIAGNOSIS — R310 Gross hematuria: Secondary | ICD-10-CM | POA: Insufficient documentation

## 2023-10-25 ENCOUNTER — Encounter: Payer: Self-pay | Admitting: Neurology

## 2023-10-25 ENCOUNTER — Ambulatory Visit (INDEPENDENT_AMBULATORY_CARE_PROVIDER_SITE_OTHER): Payer: Commercial Managed Care - PPO | Admitting: Neurology

## 2023-10-25 VITALS — BP 120/82 | HR 65

## 2023-10-25 DIAGNOSIS — I69159 Hemiplegia and hemiparesis following nontraumatic intracerebral hemorrhage affecting unspecified side: Secondary | ICD-10-CM

## 2023-10-25 DIAGNOSIS — G40009 Localization-related (focal) (partial) idiopathic epilepsy and epileptic syndromes with seizures of localized onset, not intractable, without status epilepticus: Secondary | ICD-10-CM

## 2023-10-25 MED ORDER — LEVETIRACETAM 100 MG/ML PO SOLN: 250.0000 mg | Freq: Two times a day (BID) | ORAL | 3 refills | Status: DC

## 2023-10-25 MED ORDER — LACOSAMIDE 100 MG PO TABS: 1.0000 | ORAL_TABLET | Freq: Two times a day (BID) | ORAL | 3 refills | Status: DC

## 2023-10-25 NOTE — Patient Instructions (Signed)
I had a long discussion with the patient and his wife regarding his remote intracerebral hemorrhage and particularly plegia and partial seizures.  Continue current antiepileptic regimen of Vimpat 100 mg twice daily and Keppra  250 mg twice daily .we will refill his seizure medications for a year.  He is doing well without any breakthrough seizures.  Strict control of hypertension with blood pressure goal below 130/90.  Continue follow-up with Dr. Riley Kill for Botox for his spasticity.  Return for follow-up in 1 year or call earlier if necessary.

## 2023-10-25 NOTE — Progress Notes (Signed)
[facility] [address] [place]. [place] [address] [phone]       OFFICE FOLLOW-UP VISIT NOTE  [name] Date of Birth:  1956-03-12 Medical Record Number:  [id number]   Referring MD: Delle Reining, PA-C  Reason for Referral: Intracerebral hemorrhage  HPI: Initial visit 04/14/2022 Donald [name] is a 67 year old African-American male seen today for office consultation visit following intracerebral hemorrhage.  He is accompanied by his wife.  History is obtained from them and review of electronic medical records and I personally reviewed pertinent available imaging films in PACS. Donald [name] is a 67 y.o. male with a medical history significant for dementia with behavioral disturbance and reported OSA not on CPAP per wife who presented to the ED via EMS for evaluation of acute onset of left hemiparesis, right gaze, and left mouth droop on 05/11/2021 Donald [name] had woken up in his normal state of health this morning. He and his wife had intercourse this morning and when she got up to get ready for the day, she noticed that Donald [name] was acting "off". She asked him to get out of bed and he stated that he was unable to move his left side and she noticed that he was looking towards the right so she activated EMS. His initial manual blood pressure at 08:15 on 05/11/2021 was 194/104 and on arrival to the hospital around 09:00 his blood pressure was 122/74. On arrival, Donald [name] was noted to have persistent left hemiparesis, right gaze, neck pain, left mouth droop, and decreased sensation on the left and was immediately taken to CT for stroke evaluation.  ICH score was 0.  NIH stroke scale on admission was 17.  CT head showed a large right frontal parenchymal hemorrhage with small adjacent subarachnoid hemorrhage and cytotoxic edema but no intraventricular extension hydrocephalus.  MRI scan of the brain was also obtained which showed parenchymal hematoma without underlying  mass lesion.  CT angiogram of the head and neck showed no large vessel stenosis, aneurysm or AV malformation.  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  2D echo showed ejection fraction of 65 to 70%.  LDL cholesterol is 140 mg percent.  Hemoglobin A1c of 5.8.  Urine drug screen was negative.  Patient was admitted in the intensive care unit.  Blood pressure slightly controlled.  Subsequently he was transferred out of the unit to inpatient rehab.  On 05/24/2021 he was found to be lethargic and drowsy and sleepy on the rehab floor and repeat CT scan of the head showed more prominent cerebral edema around his recent intracerebral hemorrhage with increased midline shift.  There was also concern for seizures so he was placed on long-term EEG monitoring and started on Keppra.  Several EEGs were obtained and long-term monitoring suggested right-sided cortical dysfunction due to the hemorrhage and severe diffuse encephalopathy but no definite seizures were noted.  He was started on hypertonic saline and was also found to be encephalopathy due to combination of cerebral edema, seizure fever and leukocytosis and mild renal insufficiency.  Condition gradually improved.  He subsequently underwent cerebral catheter angiogram on 05/28/2021 which showed again no evidence of aneurysm, AV malformation or vascular stenosis.  Right transverse and sigmoid sinuses had no flow likely due to occlusion.  His mental status showed gradual improvement and Keppra was discontinued and he was started on amantadine to promote wakefulness.  Patient went to inpatient rehab and was discharged home  on August 1.  He is living at home with his wife being the sole caregiver.  They do have some help from family members.  Patient is currently getting home physical occupational therapy but is not able to walk.  He has trace movement in his left elbow extension and left feet.  He  can help a little bit with transfers but he can barely stand even with one-person assist from the therapist.  He has had cognitive impairment and memory problems and is slow in processing information though is able to speak clearly but speech is slow and hesitant and is not very talkative.  He has no prior history of strokes, TIAs, seizures or significant neurological problems.  He has no known history of hypertension. Update 11/10/2022 : He returns for follow-up after last visit 6 months ago.  He is accompanied by his wife. Patient is more alert unchanged.  Continues to have significant spastic left hemiplegia with barely any movements.  He is able to help with transfers is tolerating but requires total care.  He can pull himself up with support.  He sees Dr. Riley Kill and gets Botox did help to some degree Remains on Keppra 250 mg twice daily and Vimpat 100 mg twice daily and tolerating both medications well without side effects.  Has not had any further breakthrough seizures.  He goes to adult daycare 5 days a week when his wife is working.  Update 10/25/2023 : He returns for follow-up after last visit 1 year ago.  He is accompanied by his wife.  He has not had any recurrent stroke or TIA symptoms.  Continues to have significant spastic left hemiplegia and is wheelchair-bound.  He can stand with assistance of 1 person but is not ambulating.  Continues to have left arm and shoulder pain and gets Botox injection by Dr. Hermelinda Medicus which seem to help to some degree.  He has not had any recurrent seizures and he remains on Keppra 250 mg twice daily and Vimpat 100 mg twice daily and he is tolerating these medications well without any side effects.  He was seen in the ER on 07/31/2023 with altered mental status.  He was worked up for UTI and workup was negative and CT scan of the head was obtained which showed old right frontoparietal hematoma without any acute findings.  He had Keppra level checked on 12/07/2022 and it  was slightly low at 7.3.  Patient has stopped physical therapy several months ago due to lack of progression.  He has no new complaints today. ROS:   14 system review of systems is positive for weakness, difficulty walking, speech difficulty, memory loss, cognitive impairment, incontinence and all other systems negative  PMH:  Past Medical History:  Diagnosis Date   Arthritis    Stroke [facility])     Social History:  Social History   Socioeconomic History   Marital status: Married    Spouse name: Not on file   Number of children: Not on file   Years of education: Not on file   Highest education level: Not on file  Occupational History   Not on file  Tobacco Use   Smoking status: Never   Smokeless tobacco: Never  Vaping Use   Vaping status: Never Used  Substance and Sexual Activity   Alcohol use: No   Drug use: No   Sexual activity: Yes    Birth control/protection: None  Other Topics Concern   Not on file  Social History Narrative  Not on file   Social Determinants of Health   Financial Resource Strain: Not on file  Food Insecurity: Not on file  Transportation Needs: Not on file  Physical Activity: Not on file  Stress: Not on file  Social Connections: Unknown (04/26/2022)   Received from [facility]   Social Network    Social Network: Not on file  Intimate Partner Violence: Unknown (03/18/2022)   Received from [facility], [facility]   HITS    Physically Hurt: Not on file    Insult or Talk Down To: Not on file    Threaten Physical Harm: Not on file    Scream or Curse: Not on file    Medications:   Current Outpatient Medications on File Prior to Visit  Medication Sig Dispense Refill   Lacosamide 100 MG TABS Take 1 tablet (100 mg total) by mouth 2 (two) times daily. 180 tablet 2   levETIRAcetam (KEPPRA) 100 MG/ML solution Take 2.5 mLs (250 mg total) by mouth 2 (two) times daily. 450 mL 0   levETIRAcetam (KEPPRA) 100 MG/ML solution Take 2.5 mLs  (250 mg total) by mouth 2 (two) times daily. 450 mL 3   Multiple Vitamin (MULTIVITAMIN) tablet Take 1 tablet by mouth daily.     No current facility-administered medications on file prior to visit.    Allergies:   Allergies  Allergen Reactions   Sulfa Antibiotics Other (See Comments)    Nausea and eye swelling    Zanaflex [Tizanidine]     lethargy    Physical Exam General: Frail malnourished looking middle-aged African-American male sitting in a wheelchair., seated, in no evident distress Head: head normocephalic and atraumatic.   Neck: supple with no carotid or supraclavicular bruits Cardiovascular: regular rate and rhythm, no murmurs Musculoskeletal: no deformity Skin:  no rash/petichiae Vascular:  Normal pulses all extremities  Neurologic Exam Mental Status: Awake and fully alert.  Not very cooperative for detailed cognitive testing.  Follows only simple midline and one-step commands.'s speech is nonfluent and speaks only few words and short sentences but no dysarthria.   Cranial Nerves: Fundoscopic exam not done. Pupils equal, briskly reactive to light. Extraocular movements show right gaze preference but able to look to the left past midline but not all the way.  Does not blink to threat on the left but does so on the right.  Hearing intact. Facial sensation intact.  Left lower facial weakness., tongue, palate moves normally and symmetrically.  Motor: Spastic left hemiplegia with 1/5 strength in left upper extremity with mostly strength only in elbow extension and trace movement in the left lower extremities.  Tone is increased on the left.  Normal strength on the right.  Poor effort on testing right lower extremity strength today as well. Sensory.: intact to touch , pinprick , position and vibratory sensation on the right and decreased on the left.  Coordination: Preserved on the right and not testable on the left.   \Gait and Station: Not tested as patient is unable to even  stand. Reflexes: 1+ and asymmetric. Toes downgoing.   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  PLAN:I had a long discussion with the patient and his wife regarding his remote intracerebral hemorrhage and particularly plegia and partial seizures.  Continue current antiepileptic regimen of Vimpat 100 mg twice daily and Keppra  250 mg twice daily .We will refill his seizure medications for a year.  He is doing well without any breakthrough seizures.  Strict control of hypertension with blood pressure goal below 130/90.  Continue follow-up with Dr. Riley Kill for Botox for his spasticity.  Return for follow-up in 1 year or call earlier if necessary.   Delia Heady, MD  Note: This document was prepared with digital dictation and possible smart phrase technology. Any transcriptional errors that result from this process are unintentional.

## 2024-01-04 ENCOUNTER — Other Ambulatory Visit: Payer: Self-pay | Admitting: Diagnostic Neuroimaging

## 2024-01-04 NOTE — Telephone Encounter (Signed)
Last seen on 10/25/23 Follow up scheduled on 10/23/24 Last filled on 10/09/23 #180 tablets (90 day supply) Rx pending to be signed

## 2024-01-05 ENCOUNTER — Ambulatory Visit (INDEPENDENT_AMBULATORY_CARE_PROVIDER_SITE_OTHER): Payer: Self-pay | Admitting: Gastroenterology

## 2024-01-05 ENCOUNTER — Encounter: Payer: Self-pay | Admitting: Gastroenterology

## 2024-01-05 VITALS — BP 110/62 | HR 73

## 2024-01-05 DIAGNOSIS — K5730 Diverticulosis of large intestine without perforation or abscess without bleeding: Secondary | ICD-10-CM

## 2024-01-05 DIAGNOSIS — I69354 Hemiplegia and hemiparesis following cerebral infarction affecting left non-dominant side: Secondary | ICD-10-CM

## 2024-01-05 DIAGNOSIS — D175 Benign lipomatous neoplasm of intra-abdominal organs: Secondary | ICD-10-CM

## 2024-01-05 DIAGNOSIS — Z8679 Personal history of other diseases of the circulatory system: Secondary | ICD-10-CM

## 2024-01-05 DIAGNOSIS — G3184 Mild cognitive impairment, so stated: Secondary | ICD-10-CM

## 2024-01-05 DIAGNOSIS — Z1211 Encounter for screening for malignant neoplasm of colon: Secondary | ICD-10-CM

## 2024-01-05 DIAGNOSIS — Z8719 Personal history of other diseases of the digestive system: Secondary | ICD-10-CM

## 2024-01-05 DIAGNOSIS — S06310S Contusion and laceration of right cerebrum without loss of consciousness, sequela: Secondary | ICD-10-CM

## 2024-01-05 NOTE — Patient Instructions (Signed)
Follow up with Dr. Barron Alvine as needed.   _______________________________________________________  If your blood pressure at your visit was 140/90 or greater, please contact your primary care physician to follow up on this.  _______________________________________________________  If you are age 67 or older, your body mass index should be between 23-30. Your There is no height or weight on file to calculate BMI. If this is out of the aforementioned range listed, please consider follow up with your Primary Care Provider.  If you are age 8 or younger, your body mass index should be between 19-25. Your There is no height or weight on file to calculate BMI. If this is out of the aformentioned range listed, please consider follow up with your Primary Care Provider.   ________________________________________________________  The [facility] providers would like to encourage you to use [facility] to communicate with providers for non-urgent requests or questions.  Due to long hold times on the telephone, sending your provider a message by [facility] may be a faster and more efficient way to get a response.  Please allow 48 business hours for a response.  Please remember that this is for non-urgent requests.  _______________________________________________________

## 2024-01-05 NOTE — Progress Notes (Signed)
   Chief Complaint:    Colon cancer screening   HPI:     Patient is a 68 y.o. male with a history of OSA, intracerebral hemorrhage 04/2021 with subsequent seizures (on Keppra, Vimpat) presenting to the Gastroenterology Clinic to discuss colonoscopy for ongoing screening.  Last colonoscopy was 06/2020 which was notable for diverticulosis along with a large, 40 mm pedunculated lipoma removed from the terminal ileum as outlined below.  He is otherwise without any GI symptoms.  Tolerating p.o. intake and having regular bowel movements.  His wife is with him today and provides all of his history and assisted in all of his medical care.  Hospital admission in 04/2021 for intracerebral hemorrhage (cerebral catheter angiogram w/o aneurysm, AVM) and subsequent spastic left hemiplegia and cognitive impairment.  Subsequently developed seizures in 03/2022, currently well-controlled on Keppra and Vimpat.  Was seen in follow-up in the Neurology Clinic on 10/25/2023.  No seizures for well over 1 year.   Endoscopic History: - 09/16/2005: Colonoscopy: Sigmoid diverticulosis - 06/19/2020: Colonoscopy: Sigmoid/ascending colon diverticulosis, 40 mm pedunculated polyp removed from the terminal ileum (path: Lipoma)   Reviewed labs from 07/2023: Normal CBC, BMP  Review of systems:     No chest pain, no SOB, no fevers, no urinary sx   Past Medical History:  Diagnosis Date   Arthritis    Dementia (HCC)    Hemiparesis (HCC)    Seizures (HCC)    Stroke (HCC)     Patient's surgical history, family medical history, social history, medications and allergies were all reviewed in Epic    Current Outpatient Medications  Medication Sig Dispense Refill   Lacosamide 100 MG TABS TAKE 1 TABLET BY MOUTH TWICE A DAY 180 tablet 1   levETIRAcetam (KEPPRA) 100 MG/ML solution Take 2.5 mLs (250 mg total) by mouth 2 (two) times daily. 450 mL 3   Multiple Vitamin (MULTIVITAMIN) tablet Take 1 tablet by mouth daily.      levETIRAcetam (KEPPRA) 100 MG/ML solution Take 2.5 mLs (250 mg total) by mouth 2 (two) times daily. 450 mL 0   No current facility-administered medications for this visit.    Physical Exam:     BP 110/62 (BP Location: Left Arm, Patient Position: Sitting, Cuff Size: Normal)   Pulse 73   SpO2 97%   GENERAL:  Pleasant male in NAD, sitting in wheelchair  ABDOMEN:  Nondistended, soft, nontender   IMPRESSION and PLAN:    1) Colon cancer screening 2) History of ileal lipoma 3) Diverticulosis 4) History of intracerebral hemorrhage 5) Cognitive impairment 6) Spastic hemiplegia  Based on previous colonoscopy, would actually not be due for repeat colonoscopy until 2031 as this was notable for a large, pedunculated lipoma, but no dysplasia or sessile serrated polyps.  Sadly, since that time he developed an intracerebral hemorrhage of indeterminate cause in 04/2021 with subsequent spastic left hemiplegia and significant cognitive impairment.  I had an in-depth conversation with the patient's wife in the room today with patient present.  Based on his current significant medical issues, undergoing screening colonoscopy does not seem appropriate in this gentleman.  Instead, endoscopic procedures would be reserved for urgent/emergent therapeutic needs.  As he is otherwise without any GI symptoms, no endoscopic procedures scheduled at this time.  He can otherwise follow-up with me on an as-needed basis.          Verlin Dike Syre Knerr ,DO, FACG 01/05/2024, 11:12 AM

## 2024-01-11 ENCOUNTER — Telehealth: Payer: Self-pay

## 2024-01-11 NOTE — Telephone Encounter (Signed)
Mychart message sent about insurance info

## 2024-01-17 ENCOUNTER — Encounter: Payer: No Typology Code available for payment source | Admitting: Physical Medicine & Rehabilitation

## 2024-02-05 ENCOUNTER — Encounter: Payer: Self-pay | Admitting: Family Medicine

## 2024-02-21 ENCOUNTER — Other Ambulatory Visit: Payer: Self-pay | Admitting: Neurology

## 2024-03-06 ENCOUNTER — Encounter: Payer: Self-pay | Admitting: Physical Medicine & Rehabilitation

## 2024-04-02 ENCOUNTER — Emergency Department (HOSPITAL_BASED_OUTPATIENT_CLINIC_OR_DEPARTMENT_OTHER)
Admission: EM | Admit: 2024-04-02 | Discharge: 2024-04-03 | Disposition: A | Discharge status: Home / Self Care | Attending: Emergency Medicine | Admitting: Emergency Medicine

## 2024-04-02 ENCOUNTER — Other Ambulatory Visit: Payer: Self-pay

## 2024-04-02 ENCOUNTER — Encounter (HOSPITAL_BASED_OUTPATIENT_CLINIC_OR_DEPARTMENT_OTHER): Payer: Self-pay | Admitting: Emergency Medicine

## 2024-04-02 DIAGNOSIS — R410 Disorientation, unspecified: Secondary | ICD-10-CM | POA: Insufficient documentation

## 2024-04-02 DIAGNOSIS — R82998 Other abnormal findings in urine: Secondary | ICD-10-CM | POA: Diagnosis present

## 2024-04-02 DIAGNOSIS — R8290 Unspecified abnormal findings in urine: Secondary | ICD-10-CM

## 2024-04-02 DIAGNOSIS — F0390 Unspecified dementia without behavioral disturbance: Secondary | ICD-10-CM | POA: Insufficient documentation

## 2024-04-02 DIAGNOSIS — R140 Abdominal distension (gaseous): Secondary | ICD-10-CM | POA: Insufficient documentation

## 2024-04-02 DIAGNOSIS — Z8673 Personal history of transient ischemic attack (TIA), and cerebral infarction without residual deficits: Secondary | ICD-10-CM | POA: Diagnosis not present

## 2024-04-02 DIAGNOSIS — R5383 Other fatigue: Secondary | ICD-10-CM | POA: Diagnosis not present

## 2024-04-02 LAB — CBC WITH DIFFERENTIAL/PLATELET
Abs Immature Granulocytes: 0.01 10*3/uL (ref 0.00–0.07)
Basophils Absolute: 0 10*3/uL (ref 0.0–0.1)
Basophils Relative: 0 %
Eosinophils Absolute: 0 10*3/uL (ref 0.0–0.5)
Eosinophils Relative: 1 %
HCT: 43.5 % (ref 39.0–52.0)
Hemoglobin: 13.8 g/dL (ref 13.0–17.0)
Immature Granulocytes: 0 %
Lymphocytes Relative: 38 %
Lymphs Abs: 1.8 10*3/uL (ref 0.7–4.0)
MCH: 30.2 pg (ref 26.0–34.0)
MCHC: 31.7 g/dL (ref 30.0–36.0)
MCV: 95.2 fL (ref 80.0–100.0)
Monocytes Absolute: 0.3 10*3/uL (ref 0.1–1.0)
Monocytes Relative: 7 %
Neutro Abs: 2.6 10*3/uL (ref 1.7–7.7)
Neutrophils Relative %: 54 %
Platelets: 204 10*3/uL (ref 150–400)
RBC: 4.57 MIL/uL (ref 4.22–5.81)
RDW: 13.8 % (ref 11.5–15.5)
WBC: 4.8 10*3/uL (ref 4.0–10.5)
nRBC: 0 % (ref 0.0–0.2)

## 2024-04-02 LAB — BASIC METABOLIC PANEL WITH GFR
Anion gap: 11 (ref 5–15)
BUN: 12 mg/dL (ref 8–23)
CO2: 24 mmol/L (ref 22–32)
Calcium: 9.6 mg/dL (ref 8.9–10.3)
Chloride: 105 mmol/L (ref 98–111)
Creatinine, Ser: 0.99 mg/dL (ref 0.61–1.24)
GFR, Estimated: >60 mL/min (ref >60)
Glucose, Bld: 77 mg/dL (ref 70–99)
Potassium: 4.2 mmol/L (ref 3.5–5.1)
Sodium: 139 mmol/L (ref 135–145)

## 2024-04-02 NOTE — ED Provider Notes (Signed)
  [name] [facility]ERGENCY DEPARTMENT AT [facility] Provider Note   CSN: [id number] Arrival date & time: 04/02/24  1904     History {Add pertinent medical, surgical, social history, OB history to HPI:1} No chief complaint on file.   Donald [name] is a 68 y.o. male.  HPI     Home Medications Prior to Admission medications   Medication Sig Start Date End Date Taking? Authorizing Provider  Lacosamide  100 MG TABS TAKE 1 TABLET BY MOUTH TWICE A DAY 01/04/24   Lisabeth Rider, MD  levETIRAcetam  (KEPPRA ) 100 MG/ML solution Take 2.5 mLs (250 mg total) by mouth 2 (two) times daily. 12/06/22 10/25/23  Lisabeth Rider, MD  levETIRAcetam  (KEPPRA ) 100 MG/ML solution Take 2.5 mLs (250 mg total) by mouth 2 (two) times daily. 10/25/23 10/19/24  Lisabeth Rider, MD  levETIRAcetam  (KEPPRA ) 100 MG/ML solution TAKE 2.5 MLS (250 MG TOTAL) BY MOUTH 2 (TWO) TIMES DAILY. 02/21/24 02/15/25  Lisabeth Rider, MD  Multiple Vitamin (MULTIVITAMIN) tablet Take 1 tablet by mouth daily.    [provider]      Allergies    Sulfa antibiotics and Zanaflex  [tizanidine ]    Review of Systems   Review of Systems  Physical Exam Updated Vital Signs BP (!) 156/90   Pulse 76   Temp 98.3 F (36.8 C) (Oral)   Resp 18   SpO2 100%  Physical Exam  ED Results / Procedures / Treatments   Labs (all labs ordered are listed, but only abnormal results are displayed) Labs Reviewed  CBC WITH DIFFERENTIAL/PLATELET  BASIC METABOLIC PANEL WITH GFR  URINALYSIS, ROUTINE W REFLEX MICROSCOPIC    EKG None  Radiology No results found.  Procedures Procedures  {Document cardiac monitor, telemetry assessment procedure when appropriate:1}  Medications Ordered in ED Medications - No data to display  ED Course/ Medical Decision Making/ A&P   {   Click here for ABCD2, HEART and other calculatorsREFRESH Note before signing :1}                              Medical Decision Making Amount and/or Complexity  of Data Reviewed Labs: ordered.   ***  {Document critical care time when appropriate:1} {Document review of labs and clinical decision tools ie heart score, Chads2Vasc2 etc:1}  {Document your independent review of radiology images, and any outside records:1} {Document your discussion with family members, caretakers, and with consultants:1} {Document social determinants of health affecting pt's care:1} {Document your decision making why or why not admission, treatments were needed:1} Final Clinical Impression(s) / ED Diagnoses Final diagnoses:  None    Rx / DC Orders ED Discharge Orders     None

## 2024-04-02 NOTE — ED Triage Notes (Signed)
 More lethargic. Dark urine,odor Decreased appetite  For a few weeks  Wears a condom cath for urine

## 2024-04-03 ENCOUNTER — Emergency Department (HOSPITAL_BASED_OUTPATIENT_CLINIC_OR_DEPARTMENT_OTHER)

## 2024-04-03 ENCOUNTER — Emergency Department (HOSPITAL_BASED_OUTPATIENT_CLINIC_OR_DEPARTMENT_OTHER): Admitting: Radiology

## 2024-04-03 LAB — URINALYSIS, ROUTINE W REFLEX MICROSCOPIC
Bacteria, UA: NONE SEEN
Bilirubin Urine: NEGATIVE
Glucose, UA: NEGATIVE mg/dL
Leukocytes,Ua: NEGATIVE
Nitrite: NEGATIVE
RBC / HPF: >50 RBC/hpf (ref 0–5)
Specific Gravity, Urine: 1.027 (ref 1.005–1.030)
pH: 5.5 (ref 5.0–8.0)

## 2024-04-03 NOTE — Discharge Instructions (Signed)
 Your workup was overall reassuring with a negative CT of the head, negative chest x-ray and x-ray of the abdomen that showed a moderate amount of stool in the right side of the colon however you are moving your bowels and passing gas.  Your electrolytes were normal and your urine showed no evidence of urinary tract infection.  Continue to orally rehydrate at home, follow-up with your PCP, return for any acute worsening symptoms or acute change in mental status.

## 2024-04-03 NOTE — ED Provider Notes (Incomplete)
  [place] EMERGENCY DEPARTMENT AT [facility] Provider Note   CSN: [id number] Arrival date & time: 04/02/24  1904     History  No chief complaint on file.   Donald [name] is a 68 y.o. male.  HPI     Home Medications Prior to Admission medications   Medication Sig Start Date End Date Taking? Authorizing Provider  Lacosamide  100 MG TABS TAKE 1 TABLET BY MOUTH TWICE A DAY 01/04/24   Lisabeth Rider, MD  levETIRAcetam  (KEPPRA ) 100 MG/ML solution Take 2.5 mLs (250 mg total) by mouth 2 (two) times daily. 12/06/22 10/25/23  Lisabeth Rider, MD  levETIRAcetam  (KEPPRA ) 100 MG/ML solution Take 2.5 mLs (250 mg total) by mouth 2 (two) times daily. 10/25/23 10/19/24  Lisabeth Rider, MD  levETIRAcetam  (KEPPRA ) 100 MG/ML solution TAKE 2.5 MLS (250 MG TOTAL) BY MOUTH 2 (TWO) TIMES DAILY. 02/21/24 02/15/25  Lisabeth Rider, MD  Multiple Vitamin (MULTIVITAMIN) tablet Take 1 tablet by mouth daily.    [provider]      Allergies    Sulfa antibiotics and Zanaflex  [tizanidine ]    Review of Systems   Review of Systems  Physical Exam Updated Vital Signs BP (!) 156/90   Pulse 76   Temp 98.3 F (36.8 C) (Oral)   Resp 18   SpO2 100%  Physical Exam  ED Results / Procedures / Treatments   Labs (all labs ordered are listed, but only abnormal results are displayed) Labs Reviewed  CBC WITH DIFFERENTIAL/PLATELET  BASIC METABOLIC PANEL WITH GFR  URINALYSIS, ROUTINE W REFLEX MICROSCOPIC    EKG None  Radiology No results found.  Procedures Procedures  {Document cardiac monitor, telemetry assessment procedure when appropriate:1}  Medications Ordered in ED Medications - No data to display  ED Course/ Medical Decision Making/ A&P   {   Click here for ABCD2, HEART and other calculatorsREFRESH Note before signing :1}                              Medical Decision Making Amount and/or Complexity of Data Reviewed Labs: ordered. Radiology:  ordered.   ***  {Document critical care time when appropriate:1} {Document review of labs and clinical decision tools ie heart score, Chads2Vasc2 etc:1}  {Document your independent review of radiology images, and any outside records:1} {Document your discussion with family members, caretakers, and with consultants:1} {Document social determinants of health affecting pt's care:1} {Document your decision making why or why not admission, treatments were needed:1} Final Clinical Impression(s) / ED Diagnoses Final diagnoses:  None    Rx / DC Orders ED Discharge Orders     None

## 2024-04-10 ENCOUNTER — Encounter: Attending: Physical Medicine & Rehabilitation | Admitting: Physical Medicine & Rehabilitation

## 2024-04-10 ENCOUNTER — Encounter: Payer: Self-pay | Admitting: Physical Medicine & Rehabilitation

## 2024-04-10 VITALS — BP 134/83 | HR 85 | Ht 65.0 in

## 2024-04-10 DIAGNOSIS — G243 Spasmodic torticollis: Secondary | ICD-10-CM | POA: Diagnosis not present

## 2024-04-10 DIAGNOSIS — G8114 Spastic hemiplegia affecting left nondominant side: Secondary | ICD-10-CM | POA: Insufficient documentation

## 2024-04-10 MED ORDER — SODIUM CHLORIDE (PF) 0.9 % IJ SOLN
5.0000 mL | INTRAMUSCULAR | Status: AC | PRN
Start: 2024-04-10 — End: ?
  Administered 2024-04-10: 5 mL

## 2024-04-10 MED ORDER — ONABOTULINUMTOXINA 100 UNITS IJ SOLR
500.0000 [IU] | Freq: Once | INTRAMUSCULAR | Status: AC
Start: 2024-04-10 — End: 2024-04-10
  Administered 2024-04-10: 500 [IU] via INTRAMUSCULAR

## 2024-04-10 NOTE — Patient Instructions (Signed)
 ALWAYS FEEL FREE TO CALL OUR OFFICE WITH ANY PROBLEMS OR QUESTIONS [phone])  **PLEASE NOTE** ALL MEDICATION REFILL REQUESTS (INCLUDING CONTROLLED SUBSTANCES) NEED TO BE MADE AT LEAST 7 DAYS PRIOR TO REFILL BEING DUE. ANY REFILL REQUESTS INSIDE THAT TIME FRAME MAY RESULT IN DELAYS IN RECEIVING YOUR PRESCRIPTION.

## 2024-04-10 NOTE — Progress Notes (Signed)
 Botox  Injection for spasticity of upper extremity using needle EMG guidance Indication: 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) - Plan: botulinum toxin Type A  (BOTOX ) injection 500 Units, sodium chloride  (PF) 0.9 % injection 5 mL  Cervical dystonia   Dilution: 100 Units/ml        Total Units Injected: 500 Indication: Severe spasticity which interferes with ADL,mobility and/or  hygiene and is unresponsive to medication management and other conservative care Informed consent was obtained after describing risks and benefits of the procedure with the patient. This includes bleeding, bruising, infection, excessive weakness, or medication side effects. A REMS form is on file and signed.  Needle: 50mm injectable monopolar needle electrode   Left arm/trunk Number of units per muscle Trap 25 units Levator scapulae 50 units SCM 75 units Pectoralis Major 0 units Pectoralis Minor 0 units Biceps 150 units Brachioradialis 50 units FCR 25 units FCU 25 units FDS 50 units FDP 50 units FPL 0 units Palmaris Longus 0 units Pronator Teres 0 units Pronator Quadratus 0 units Lumbricals 0 units All injections were done after obtaining appropriate EMG activity and after negative drawback for blood. The patient tolerated the procedure well. Post procedure instructions were given. Return in about 3 months (around 07/10/2024) for botox  left arm, neck.

## 2024-06-08 ENCOUNTER — Encounter (HOSPITAL_COMMUNITY): Payer: Self-pay | Admitting: Interventional Radiology

## 2024-07-16 ENCOUNTER — Other Ambulatory Visit: Payer: Self-pay | Admitting: Neurology

## 2024-07-24 ENCOUNTER — Other Ambulatory Visit: Payer: Self-pay

## 2024-07-24 ENCOUNTER — Encounter: Admitting: Physical Medicine & Rehabilitation

## 2024-07-25 ENCOUNTER — Other Ambulatory Visit: Payer: Self-pay | Admitting: Neurology

## 2024-07-25 MED ORDER — LACOSAMIDE 100 MG PO TABS: 1.0000 | ORAL_TABLET | Freq: Two times a day (BID) | ORAL | 1 refills | Status: AC

## 2024-08-28 ENCOUNTER — Encounter: Attending: Physical Medicine & Rehabilitation | Admitting: Physical Medicine & Rehabilitation

## 2024-08-28 ENCOUNTER — Encounter: Payer: Self-pay | Admitting: Physical Medicine & Rehabilitation

## 2024-08-28 VITALS — BP 117/72 | HR 64 | Ht 65.0 in

## 2024-08-28 DIAGNOSIS — G243 Spasmodic torticollis: Secondary | ICD-10-CM | POA: Insufficient documentation

## 2024-08-28 DIAGNOSIS — G8114 Spastic hemiplegia affecting left nondominant side: Secondary | ICD-10-CM | POA: Diagnosis present

## 2024-08-28 MED ORDER — SODIUM CHLORIDE (PF) 0.9 % IJ SOLN
5.0000 mL | Freq: Once | INTRAMUSCULAR | Status: AC
  Administered 2024-08-28: 5 mL

## 2024-08-28 MED ORDER — ONABOTULINUMTOXINA 100 UNITS IJ SOLR
500.0000 [IU] | Freq: Once | INTRAMUSCULAR | Status: AC
  Administered 2024-08-28: 500 [IU] via INTRAMUSCULAR

## 2024-08-28 NOTE — Patient Instructions (Signed)
 ALWAYS FEEL FREE TO CALL OUR OFFICE WITH ANY PROBLEMS OR QUESTIONS [phone])  **PLEASE NOTE** ALL MEDICATION REFILL REQUESTS (INCLUDING CONTROLLED SUBSTANCES) NEED TO BE MADE AT LEAST 7 DAYS PRIOR TO REFILL BEING DUE. ANY REFILL REQUESTS INSIDE THAT TIME FRAME MAY RESULT IN DELAYS IN RECEIVING YOUR PRESCRIPTION.

## 2024-08-28 NOTE — Progress Notes (Signed)
 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 This includes bleeding, bruising, infection, excessive weakness, or medication side effects. A REMS form is on file and signed.  Needle: 50mm injectable monopolar needle electrode    Number of units per muscle Trap 50 units SCM 50 units Pectoralis Major 50 units Pectoralis Minor 50 units Biceps 200 units Brachioradialis 100 units FCR 0 units FCU 0 units FDS 0 units FDP 0 units FPL 0 units Palmaris Longus 0 units Pronator Teres 0 units Pronator Quadratus 0 units Lumbricals 0 units All injections were done after obtaining appropriate EMG activity and after negative drawback for blood. The patient tolerated the procedure well. Post procedure instructions were given. No follow-ups on file.

## 2024-10-21 ENCOUNTER — Telehealth: Payer: Self-pay | Admitting: Neurology

## 2024-10-21 NOTE — Telephone Encounter (Signed)
 Appointment cx, Wife states pt is sick

## 2024-10-22 ENCOUNTER — Encounter (HOSPITAL_BASED_OUTPATIENT_CLINIC_OR_DEPARTMENT_OTHER): Payer: Self-pay | Admitting: Family Medicine

## 2024-10-22 ENCOUNTER — Ambulatory Visit (HOSPITAL_BASED_OUTPATIENT_CLINIC_OR_DEPARTMENT_OTHER): Admitting: Family Medicine

## 2024-10-22 VITALS — BP 118/75 | HR 83 | Ht 65.0 in

## 2024-10-22 DIAGNOSIS — G8114 Spastic hemiplegia affecting left nondominant side: Secondary | ICD-10-CM | POA: Diagnosis not present

## 2024-10-22 DIAGNOSIS — Z8673 Personal history of transient ischemic attack (TIA), and cerebral infarction without residual deficits: Secondary | ICD-10-CM | POA: Diagnosis not present

## 2024-10-22 DIAGNOSIS — R7303 Prediabetes: Secondary | ICD-10-CM

## 2024-10-22 DIAGNOSIS — I10 Essential (primary) hypertension: Secondary | ICD-10-CM | POA: Diagnosis not present

## 2024-10-22 DIAGNOSIS — Z0000 Encounter for general adult medical examination without abnormal findings: Secondary | ICD-10-CM

## 2024-10-22 NOTE — Assessment & Plan Note (Signed)
 Recommend continued follow-up with specialist including PMR and neurology.  Continues receiving Botox  injections through PMR specialist.  Has engaged with physical therapy in the past, not utilizing currently.

## 2024-10-22 NOTE — Assessment & Plan Note (Signed)
 Blood pressure appropriate in office today, not currently utilizing any medication.  We can continue with monitoring blood pressure.  No medication recommended at this time.

## 2024-10-22 NOTE — Progress Notes (Signed)
 New Patient Office Visit  Subjective   Patient ID: Donald [name], male    DOB: 08/27/1956  Age: 68 y.o. MRN: [id number]  CC:  Chief Complaint  Patient presents with   Transitions Of Care    HPI Donald [name] presents to establish care Last PCP - Dr. Levora  History primarily provided by patient's spouse  History of stroke with residual hemiparesis. He does follow with PMR and Neurology - Dr. Babs and Dr. Rosemarie. Did have seizure about 1 year after stroke. Was placed on Keppra  and has been doing well with this. Denies any issues with seizures with current medication regimen.  Patient is originally from [place].  Outpatient Encounter Medications as of 10/22/2024  Medication Sig   Lacosamide  100 MG TABS Take 1 tablet (100 mg total) by mouth 2 (two) times daily.   levETIRAcetam  (KEPPRA ) 100 MG/ML solution TAKE 2.5 MLS (250 MG TOTAL) BY MOUTH 2 (TWO) TIMES DAILY.   Multiple Vitamin (MULTIVITAMIN) tablet Take 1 tablet by mouth daily.   levETIRAcetam  (KEPPRA ) 100 MG/ML solution Take 2.5 mLs (250 mg total) by mouth 2 (two) times daily.   levETIRAcetam  (KEPPRA ) 100 MG/ML solution Take 2.5 mLs (250 mg total) by mouth 2 (two) times daily.   Facility-Administered Encounter Medications as of 10/22/2024  Medication   sodium chloride  (PF) 0.9 % injection 5 mL    Past Medical History:  Diagnosis Date   Arthritis    Dementia (HCC)    Hemiparesis (HCC)    Seizures (HCC)    Stroke (HCC)     Past Surgical History:  Procedure Laterality Date   APPENDECTOMY     IR ANGIO INTRA EXTRACRAN SEL COM CAROTID INNOMINATE BILAT MOD SED  05/28/2021   IR ANGIO VERTEBRAL SEL VERTEBRAL BILAT MOD SED  05/28/2021   IR IVC FILTER PLMT / S&I /IMG GUID/MOD SED  05/27/2021   IR US  GUIDE VASC ACCESS RIGHT  05/27/2021    Family History  Problem Relation Age of Onset   Hypertension Mother    Colon polyps Neg Hx    Colon cancer Neg Hx    Esophageal cancer Neg Hx    Rectal cancer Neg Hx    Stomach  cancer Neg Hx     Social History   Socioeconomic History   Marital status: Married    Spouse name: Not on file   Number of children: Not on file   Years of education: Not on file   Highest education level: Not on file  Occupational History   Not on file  Tobacco Use   Smoking status: Never   Smokeless tobacco: Never  Vaping Use   Vaping status: Never Used  Substance and Sexual Activity   Alcohol use: No   Drug use: No   Sexual activity: Not Currently    Birth control/protection: None  Other Topics Concern   Not on file  Social History Narrative   Not on file   Social Drivers of Health   Financial Resource Strain: Not on file  Food Insecurity: Not on file  Transportation Needs: Not on file  Physical Activity: Not on file  Stress: Not on file  Social Connections: Unknown (04/26/2022)   [facility]   Social Network    Social Network: Not on file  Intimate Partner Violence: Unknown (03/18/2022)   Received from [facility]   HITS    Physically Hurt: Not on file    Insult or Talk Down To: Not on file  Threaten Physical Harm: Not on file    Scream or Curse: Not on file    Objective   BP 118/75 (BP Location: Right Arm, Patient Position: Sitting, Cuff Size: Normal)   Pulse 83   Ht 5' 5 (1.651 m)   SpO2 99%   BMI 22.45 kg/m   Physical Exam  68 year old male presenting in wheelchair.  Accompanied by wife today. Cardiovascular exam with regular rate and rhythm, lungs clear to auscultation bilaterally  Assessment & Plan:   Essential (primary) hypertension Assessment & Plan: Blood pressure appropriate in office today, not currently utilizing any medication.  We can continue with monitoring blood pressure.  No medication recommended at this time.   Wellness examination -     CBC with Differential/Platelet; Future -     Comprehensive metabolic panel with GFR; Future -     Hemoglobin A1c; Future -     Lipid panel;  Future  Prediabetes Assessment & Plan: We can continue with intermittent monitoring of labs, plan to check labs before next appointment for physical.  Can proceed with lifestyle modifications, primarily dietary and focus.   History of stroke Assessment & Plan: Recommend continued follow-up with specialist including PMR and neurology.  Continues receiving Botox  injections through PMR specialist.  Has engaged with physical therapy in the past, not utilizing currently.   Return in about 2 months (around 12/22/2024) for CPE with fasting labs 1 week prior.   Spent 49 minutes on this patient encounter, including preparation, chart review, face-to-face counseling with patient and coordination of care, and documentation of encounter   ___________________________________________ [name], MD, ABFM, [facility] Primary Care and Sports Medicine [facility]

## 2024-10-22 NOTE — Assessment & Plan Note (Signed)
 We can continue with intermittent monitoring of labs, plan to check labs before next appointment for physical.  Can proceed with lifestyle modifications, primarily dietary and focus.

## 2024-10-23 ENCOUNTER — Ambulatory Visit: Payer: Medicare Other | Admitting: Neurology

## 2024-11-27 ENCOUNTER — Encounter: Admitting: Physical Medicine & Rehabilitation

## 2024-12-25 ENCOUNTER — Encounter (HOSPITAL_BASED_OUTPATIENT_CLINIC_OR_DEPARTMENT_OTHER): Payer: Self-pay | Admitting: Family Medicine

## 2024-12-25 ENCOUNTER — Other Ambulatory Visit (HOSPITAL_BASED_OUTPATIENT_CLINIC_OR_DEPARTMENT_OTHER): Payer: Self-pay | Admitting: Family Medicine

## 2024-12-25 ENCOUNTER — Ambulatory Visit (INDEPENDENT_AMBULATORY_CARE_PROVIDER_SITE_OTHER): Admitting: Family Medicine

## 2024-12-25 VITALS — BP 130/91 | HR 71 | Resp 20

## 2024-12-25 DIAGNOSIS — Z0000 Encounter for general adult medical examination without abnormal findings: Secondary | ICD-10-CM

## 2024-12-25 DIAGNOSIS — G8114 Spastic hemiplegia affecting left nondominant side: Secondary | ICD-10-CM

## 2024-12-25 DIAGNOSIS — L609 Nail disorder, unspecified: Secondary | ICD-10-CM | POA: Diagnosis not present

## 2024-12-25 LAB — COMPREHENSIVE METABOLIC PANEL WITH GFR
ALT: 18 IU/L (ref 0–44)
AST: 19 IU/L (ref 0–40)
Albumin: 4.3 g/dL (ref 3.9–4.9)
Alkaline Phosphatase: 138 IU/L — ABNORMAL HIGH (ref 47–123)
BUN/Creatinine Ratio: 10 (ref 10–24)
BUN: 7 mg/dL — ABNORMAL LOW (ref 8–27)
Bilirubin Total: 0.3 mg/dL (ref 0.0–1.2)
CO2: 23 mmol/L (ref 20–29)
Calcium: 9.7 mg/dL (ref 8.6–10.2)
Chloride: 100 mmol/L (ref 96–106)
Creatinine, Ser: 0.69 mg/dL — ABNORMAL LOW (ref 0.76–1.27)
Globulin, Total: 2.8 g/dL (ref 1.5–4.5)
Glucose: 87 mg/dL (ref 70–99)
Potassium: 4.7 mmol/L (ref 3.5–5.2)
Sodium: 139 mmol/L (ref 134–144)
Total Protein: 7.1 g/dL (ref 6.0–8.5)
eGFR: 101 mL/min/1.73

## 2024-12-25 LAB — CBC WITH DIFFERENTIAL/PLATELET
Basophils Absolute: 0 x10E3/uL (ref 0.0–0.2)
Basos: 0 %
EOS (ABSOLUTE): 0 x10E3/uL (ref 0.0–0.4)
Eos: 0 %
Hematocrit: 43.9 % (ref 37.5–51.0)
Hemoglobin: 14.5 g/dL (ref 13.0–17.7)
Immature Grans (Abs): 0 x10E3/uL (ref 0.0–0.1)
Immature Granulocytes: 0 %
Lymphocytes Absolute: 1.5 x10E3/uL (ref 0.7–3.1)
Lymphs: 32 %
MCH: 31.3 pg (ref 26.6–33.0)
MCHC: 33 g/dL (ref 31.5–35.7)
MCV: 95 fL (ref 79–97)
Monocytes Absolute: 0.4 x10E3/uL (ref 0.1–0.9)
Monocytes: 7 %
Neutrophils Absolute: 2.8 x10E3/uL (ref 1.4–7.0)
Neutrophils: 61 %
Platelets: 212 x10E3/uL (ref 150–450)
RBC: 4.63 x10E6/uL (ref 4.14–5.80)
RDW: 13 % (ref 11.6–15.4)
WBC: 4.8 x10E3/uL (ref 3.4–10.8)

## 2024-12-25 LAB — HEMOGLOBIN A1C
Est. average glucose Bld gHb Est-mCnc: 111 mg/dL
Hgb A1c MFr Bld: 5.5 % (ref 4.8–5.6)

## 2024-12-25 LAB — LIPID PANEL
Chol/HDL Ratio: 2.7 ratio (ref 0.0–5.0)
Cholesterol, Total: 193 mg/dL (ref 100–199)
HDL: 71 mg/dL
LDL Chol Calc (NIH): 114 mg/dL — ABNORMAL HIGH (ref 0–99)
Triglycerides: 42 mg/dL (ref 0–149)
VLDL Cholesterol Cal: 8 mg/dL (ref 5–40)

## 2024-12-25 MED ORDER — LEVETIRACETAM 100 MG/ML PO SOLN: 250.0000 mg | Freq: Two times a day (BID) | ORAL | 1 refills | Status: AC

## 2024-12-25 NOTE — Assessment & Plan Note (Signed)
 Follows with PM&R, missed last appointment.  Recommend rescheduling appointment with specialist.

## 2024-12-25 NOTE — Assessment & Plan Note (Signed)
 Routine HCM labs ordered. HCM reviewed/discussed. Anticipatory guidance regarding healthy weight, lifestyle and choices given. Recommend healthy diet.  Recommend approximately 150 minutes/week of moderate intensity exercise Recommend regular dental and vision exams Always use seatbelt/lap and shoulder restraints Recommend using smoke alarms and checking batteries at least twice a year Recommend using sunscreen when outside Discussed colon cancer screening recommendations, options.  Patient had colonoscopy in 2021 Discussed immunization recommendations

## 2024-12-25 NOTE — Progress Notes (Addendum)
 " Subjective:    CC: Annual Physical Exam  HPI: Donald [name] is a 69 y.o. presenting for annual physical  I reviewed the past medical history, family history, social history, surgical history, and allergies today and no changes were needed.  Please see the problem list section below in epic for further details.  Past Medical History: Past Medical History:  Diagnosis Date   Arthritis    Dementia (HCC)    Hemiparesis (HCC)    Seizures (HCC)    Stroke (HCC)    Past Surgical History: Past Surgical History:  Procedure Laterality Date   APPENDECTOMY     IR ANGIO INTRA EXTRACRAN SEL COM CAROTID INNOMINATE BILAT MOD SED  05/28/2021   IR ANGIO VERTEBRAL SEL VERTEBRAL BILAT MOD SED  05/28/2021   IR IVC FILTER PLMT / S&I /IMG GUID/MOD SED  05/27/2021   IR US  GUIDE VASC ACCESS RIGHT  05/27/2021   Social History: Social History   Socioeconomic History   Marital status: Married    Spouse name: Not on file   Number of children: Not on file   Years of education: Not on file   Highest education level: Not on file  Occupational History   Not on file  Tobacco Use   Smoking status: Never   Smokeless tobacco: Never  Vaping Use   Vaping status: Never Used  Substance and Sexual Activity   Alcohol use: No   Drug use: No   Sexual activity: Not Currently    Birth control/protection: None  Other Topics Concern   Not on file  Social History Narrative   Not on file   Social Drivers of Health   Tobacco Use: Low Risk (12/25/2024)   Patient History    Smoking Tobacco Use: Never    Smokeless Tobacco Use: Never    Passive Exposure: Not on file  Financial Resource Strain: Not on file  Food Insecurity: Not on file  Transportation Needs: Not on file  Physical Activity: Not on file  Stress: Not on file  Social Connections: Unknown (04/26/2022)   [facility]   Social Network    Social Network: Not on file  Depression (PHQ2-9): Low Risk (04/10/2024)   Depression (PHQ2-9)     PHQ-2 Score: 0  Alcohol Screen: Not on file  Housing: Not on file  Utilities: Not on file  Health Literacy: Not on file   Family History: Family History  Problem Relation Age of Onset   Hypertension Mother    Colon polyps Neg Hx    Colon cancer Neg Hx    Esophageal cancer Neg Hx    Rectal cancer Neg Hx    Stomach cancer Neg Hx    Allergies: Allergies[1] Medications: See med rec.  Review of Systems: No headache, visual changes, nausea, vomiting, diarrhea, constipation, dizziness, abdominal pain, skin rash, fevers, chills, night sweats, swollen lymph nodes, weight loss, chest pain, body aches, joint swelling, muscle aches, shortness of breath, mood changes, visual or auditory hallucinations.  Objective:    BP (!) 130/91 (BP Location: Right Arm, Patient Position: Sitting, Cuff Size: Normal)   Pulse 71   Resp 20   General: Patient presents in wheelchair.  Has residual left-sided hemiplegia from prior stroke. HEENT: Normocephalic, atraumatic, pupils equal round reactive to light. Oropharynx unable to be examined., External ear canals are unremarkable. Skin: Warm and dry, no rashes noted. Cardiac: Regular rate and rhythm, no murmurs rubs or gallops. Respiratory: Clear to auscultation bilaterally. Not using accessory muscles. Abdominal: Soft,  nontender, positive bowel sounds Musculoskeletal: Left-sided hemiplegia  Impression and Recommendations:    Wellness examination Assessment & Plan: Routine HCM labs ordered. HCM reviewed/discussed. Anticipatory guidance regarding healthy weight, lifestyle and choices given. Recommend healthy diet.  Recommend approximately 150 minutes/week of moderate intensity exercise Recommend regular dental and vision exams Always use seatbelt/lap and shoulder restraints Recommend using smoke alarms and checking batteries at least twice a year Recommend using sunscreen when outside Discussed colon cancer screening recommendations, options.  Patient had  colonoscopy in 2021 Discussed immunization recommendations   Nail changes -     Ambulatory referral to Podiatry  Spastic hemiparesis of left nondominant side [facility]) Assessment & Plan: Follows with PM&R, missed last appointment.  Recommend rescheduling appointment with specialist.   Other orders -     levETIRAcetam ; Take 2.5 mLs (250 mg total) by mouth 2 (two) times daily.  Dispense: 450 mL; Refill: 1  Patient also overdue for follow-up with neurology, recommend contacting office to reschedule missed appointment  Return in about 6 months (around 06/24/2025) for hypertension.   ___________________________________________ Mervil Wacker de [place], MD, ABFM, [facility] Primary Care and Sports Medicine [facility]     [1]  Allergies Allergen Reactions   Sulfa Antibiotics Other (See Comments)    Nausea and eye swelling    Zanaflex  [Tizanidine ]     lethargy   "

## 2025-01-02 ENCOUNTER — Ambulatory Visit: Admitting: Podiatry

## 2025-01-02 ENCOUNTER — Encounter: Payer: Self-pay | Admitting: Podiatry

## 2025-01-02 DIAGNOSIS — M79675 Pain in left toe(s): Secondary | ICD-10-CM | POA: Diagnosis not present

## 2025-01-02 DIAGNOSIS — M79674 Pain in right toe(s): Secondary | ICD-10-CM

## 2025-01-02 DIAGNOSIS — B351 Tinea unguium: Secondary | ICD-10-CM | POA: Diagnosis not present

## 2025-01-02 DIAGNOSIS — L89621 Pressure ulcer of left heel, stage 1: Secondary | ICD-10-CM | POA: Diagnosis not present

## 2025-01-02 DIAGNOSIS — F0390 Unspecified dementia without behavioral disturbance: Secondary | ICD-10-CM | POA: Diagnosis not present

## 2025-01-02 DIAGNOSIS — L89891 Pressure ulcer of other site, stage 1: Secondary | ICD-10-CM

## 2025-01-02 NOTE — Progress Notes (Signed)
 "      No chief complaint on file.   HPI: 69 y.o. male presents today accompanied by caretaker. He has past medical history of dementia, stroke and associated hemiparesis.  Caretaker is concerned about changes that she has noticed to some of his toenails which are beginning to show thickened and dystrophic changes.  They are well-maintained today as she has recently trimmed the toenails.  She is wondering about getting established for at-risk footcare.  Patient is wheelchair-bound.  Past Medical History:  Diagnosis Date   Arthritis    Dementia (HCC)    Hemiparesis (HCC)    Seizures (HCC)    Stroke [facility])     Past Surgical History:  Procedure Laterality Date   APPENDECTOMY     IR ANGIO INTRA EXTRACRAN SEL COM CAROTID INNOMINATE BILAT MOD SED  05/28/2021   IR ANGIO VERTEBRAL SEL VERTEBRAL BILAT MOD SED  05/28/2021   IR IVC FILTER PLMT / S&I /IMG GUID/MOD SED  05/27/2021   IR US  GUIDE VASC ACCESS RIGHT  05/27/2021    Allergies[1]  ROS denies any nausea, vomiting, fever, chills, chest pain, shortness of breath   Physical Exam: There were no vitals filed for this visit.  General: The patient is alert and oriented x3 in no acute distress.  Dermatology: Pedal skin shiny and atrophic.  Evidence of pressure injury noted left posterior lateral heel and midfoot.  Small area of stable eschar noted left lateral heel.  Some evidence of skin discoloration along the fifth met base region.  Dry xerotic skin to the nails.  Nailplates dystrophic with evidence of thickening, nail discoloration are overall well-maintained.  Vascular: DP and Pt pulses faintly palpable bilaterally. Capillary refill within normal limits.  No appreciable edema. Sparse pedal hair growth.  Neurological: Light touch sensation grossly intact bilateral feet.   Musculoskeletal Exam: No pedal deformities noted. Decreased muscle strength and tone secondary to hemiparesis.   Assessment/Plan of Care: 1. Dementia, unspecified  dementia severity, unspecified dementia type, unspecified whether behavioral, psychotic, or mood disturbance or anxiety (HCC)   2. Pain due to onychomycosis of toenails of both feet   3. Pressure injury of both feet, stage 1      No orders of the defined types were placed in this encounter.  None  New patient visit discussed clinical findings with patient today.  # Pain due to onychomycosis of toenails - Findings discussed with caretaker - Nails are normo trophic today - Would benefit from at risk diabetic footcare due to dementia, some degree of decreased pedal pulses going forward though the nails did not require significant debridement today. - Discussed various treatment options.  Will proceed with palliative regular nail debridements going forward.  May also apply Vicks vapor rub to help keep toenail soft.  # Pressure injury left heel and lateral foot - Discussed offloading of lower extremities with frequent repositioning to prevent further ulceration and skin breakdown - Small area of eschar approximately 1 cm in diameter left lateral heel dressed with bordered foam bandage and painted with Betadine - Continue to paint with Betadine daily and monitor for signs of developing infection - Follow-up as needed if any signs or symptoms of infection develop or if wound appears to worsen.   Follow-up in approximately 3 months for at risk footcare.  Anacaren Kohan L. Lamount MAUL, AACFAS [facility]     [address]  [place], [place] [address]                Office [phone]  Fax [phone]      [1]  Allergies Allergen Reactions   Sulfa Antibiotics Other (See Comments)    Nausea and eye swelling    Zanaflex  [Tizanidine ]     lethargy   "

## 2025-01-02 NOTE — Patient Instructions (Signed)
 Nails: Would benefit from scheduled professional nail debridement.  May apply small amount of Vicks VapoRub to affected toenails at nighttime to help keep them soft and may improve coloration over time.  Alternatively, You can combine 3 tablespoons of coconut oil and 10-15 drops of tea tree oil together and apply to the toenails once a day.    Watch skin closely for signs of pressure ulceration.  Signs of this include developing dark scabs, bluish, blackish, reddish discoloration skin early signs of irritation.  Recommend looking for offloading waffle boots for when patient is in bed.  Reposition lower extremities frequently and limit pressure on weight bearing areas ie heels, lateral feet and ankles when legs are rotated.  Check for these areas daily.  For now you may apply a bordered foam type bandage for padding.  And spot clean the areas with antibacterial soap and water .  He can also paint the area with a small amount of Betadine solution.

## 2025-04-02 ENCOUNTER — Ambulatory Visit: Admitting: Podiatry

## 2025-06-25 ENCOUNTER — Ambulatory Visit (HOSPITAL_BASED_OUTPATIENT_CLINIC_OR_DEPARTMENT_OTHER): Admitting: Family Medicine
